# Patient Record
Sex: Female | Born: 1935 | Race: White | Hispanic: No | Smoking: Former smoker
Health system: Southern US, Community
[De-identification: ages and names within clinical notes are randomized; demographics above are authoritative.]

## PROBLEM LIST (undated history)

## (undated) DIAGNOSIS — N200 Calculus of kidney: Secondary | ICD-10-CM

## (undated) DIAGNOSIS — K909 Intestinal malabsorption, unspecified: Secondary | ICD-10-CM

## (undated) DIAGNOSIS — F32A Depression, unspecified: Secondary | ICD-10-CM

## (undated) DIAGNOSIS — K660 Peritoneal adhesions (postprocedural) (postinfection): Secondary | ICD-10-CM

## (undated) DIAGNOSIS — F419 Anxiety disorder, unspecified: Secondary | ICD-10-CM

## (undated) DIAGNOSIS — E039 Hypothyroidism, unspecified: Secondary | ICD-10-CM

## (undated) DIAGNOSIS — K922 Gastrointestinal hemorrhage, unspecified: Secondary | ICD-10-CM

## (undated) DIAGNOSIS — M545 Low back pain, unspecified: Secondary | ICD-10-CM

## (undated) DIAGNOSIS — M1990 Unspecified osteoarthritis, unspecified site: Secondary | ICD-10-CM

## (undated) DIAGNOSIS — Z87442 Personal history of urinary calculi: Secondary | ICD-10-CM

## (undated) DIAGNOSIS — D649 Anemia, unspecified: Secondary | ICD-10-CM

## (undated) DIAGNOSIS — Z9289 Personal history of other medical treatment: Secondary | ICD-10-CM

## (undated) DIAGNOSIS — I4891 Unspecified atrial fibrillation: Secondary | ICD-10-CM

## (undated) DIAGNOSIS — K219 Gastro-esophageal reflux disease without esophagitis: Secondary | ICD-10-CM

## (undated) DIAGNOSIS — J309 Allergic rhinitis, unspecified: Secondary | ICD-10-CM

## (undated) DIAGNOSIS — C50919 Malignant neoplasm of unspecified site of unspecified female breast: Secondary | ICD-10-CM

## (undated) DIAGNOSIS — F329 Major depressive disorder, single episode, unspecified: Secondary | ICD-10-CM

## (undated) DIAGNOSIS — K90829 Short bowel syndrome, unspecified: Secondary | ICD-10-CM

## (undated) DIAGNOSIS — I10 Essential (primary) hypertension: Secondary | ICD-10-CM

## (undated) DIAGNOSIS — K912 Postsurgical malabsorption, not elsewhere classified: Secondary | ICD-10-CM

## (undated) DIAGNOSIS — K8020 Calculus of gallbladder without cholecystitis without obstruction: Secondary | ICD-10-CM

## (undated) DIAGNOSIS — I350 Nonrheumatic aortic (valve) stenosis: Secondary | ICD-10-CM

## (undated) HISTORY — DX: Major depressive disorder, single episode, unspecified: F32.9

## (undated) HISTORY — DX: Nonrheumatic aortic (valve) stenosis: I35.0

## (undated) HISTORY — DX: Essential (primary) hypertension: I10

## (undated) HISTORY — DX: Depression, unspecified: F32.A

## (undated) HISTORY — PX: BOWEL RESECTION: SHX1257

## (undated) HISTORY — DX: Intestinal malabsorption, unspecified: K90.9

## (undated) HISTORY — DX: Gastro-esophageal reflux disease without esophagitis: K21.9

## (undated) HISTORY — DX: Calculus of kidney: N20.0

## (undated) HISTORY — DX: Hypothyroidism, unspecified: E03.9

## (undated) HISTORY — DX: Calculus of gallbladder without cholecystitis without obstruction: K80.20

## (undated) HISTORY — DX: Unspecified atrial fibrillation: I48.91

## (undated) HISTORY — DX: Malignant neoplasm of unspecified site of unspecified female breast: C50.919

## (undated) HISTORY — PX: ABDOMINAL HYSTERECTOMY: SHX81

## (undated) HISTORY — DX: Anxiety disorder, unspecified: F41.9

## (undated) HISTORY — PX: MASTECTOMY: SHX3

## (undated) HISTORY — DX: Gastrointestinal hemorrhage, unspecified: K92.2

## (undated) HISTORY — PX: UPPER GASTROINTESTINAL ENDOSCOPY: SHX188

---

## 1958-12-15 HISTORY — PX: ABDOMINAL HYSTERECTOMY: SHX81

## 1963-12-16 HISTORY — PX: LAPAROSCOPIC LYSIS OF ADHESIONS: SHX5905

## 1992-12-15 DIAGNOSIS — K660 Peritoneal adhesions (postprocedural) (postinfection): Secondary | ICD-10-CM

## 1992-12-15 HISTORY — DX: Peritoneal adhesions (postprocedural) (postinfection): K66.0

## 2000-05-21 ENCOUNTER — Emergency Department (HOSPITAL_COMMUNITY): Admission: EM | Admit: 2000-05-21 | Discharge: 2000-05-21 | Payer: Self-pay | Admitting: Emergency Medicine

## 2001-11-19 ENCOUNTER — Encounter: Payer: Self-pay | Admitting: Urology

## 2001-11-19 ENCOUNTER — Ambulatory Visit (HOSPITAL_COMMUNITY): Admission: RE | Admit: 2001-11-19 | Discharge: 2001-11-19 | Payer: Self-pay | Admitting: Urology

## 2001-11-24 ENCOUNTER — Encounter: Payer: Self-pay | Admitting: Urology

## 2001-11-24 ENCOUNTER — Ambulatory Visit (HOSPITAL_COMMUNITY): Admission: RE | Admit: 2001-11-24 | Discharge: 2001-11-24 | Payer: Self-pay | Admitting: Urology

## 2002-01-05 ENCOUNTER — Ambulatory Visit (HOSPITAL_COMMUNITY): Admission: RE | Admit: 2002-01-05 | Discharge: 2002-01-05 | Payer: Self-pay | Admitting: Urology

## 2002-01-05 ENCOUNTER — Encounter: Payer: Self-pay | Admitting: Urology

## 2002-01-13 ENCOUNTER — Ambulatory Visit (HOSPITAL_COMMUNITY): Admission: RE | Admit: 2002-01-13 | Discharge: 2002-01-13 | Payer: Self-pay | Admitting: Urology

## 2002-01-13 ENCOUNTER — Encounter: Payer: Self-pay | Admitting: Urology

## 2002-12-15 DIAGNOSIS — N200 Calculus of kidney: Secondary | ICD-10-CM

## 2002-12-15 DIAGNOSIS — K922 Gastrointestinal hemorrhage, unspecified: Secondary | ICD-10-CM

## 2002-12-15 HISTORY — DX: Calculus of kidney: N20.0

## 2002-12-15 HISTORY — DX: Gastrointestinal hemorrhage, unspecified: K92.2

## 2002-12-28 ENCOUNTER — Ambulatory Visit (HOSPITAL_COMMUNITY): Admission: RE | Admit: 2002-12-28 | Discharge: 2002-12-28 | Payer: Self-pay | Admitting: Internal Medicine

## 2003-02-28 ENCOUNTER — Encounter: Payer: Self-pay | Admitting: Family Medicine

## 2003-02-28 ENCOUNTER — Ambulatory Visit (HOSPITAL_COMMUNITY): Admission: RE | Admit: 2003-02-28 | Discharge: 2003-02-28 | Payer: Self-pay | Admitting: Family Medicine

## 2003-03-13 ENCOUNTER — Encounter: Payer: Self-pay | Admitting: Family Medicine

## 2003-03-13 ENCOUNTER — Ambulatory Visit (HOSPITAL_COMMUNITY): Admission: RE | Admit: 2003-03-13 | Discharge: 2003-03-13 | Payer: Self-pay | Admitting: Family Medicine

## 2003-06-13 ENCOUNTER — Encounter: Payer: Self-pay | Admitting: *Deleted

## 2003-06-13 ENCOUNTER — Emergency Department (HOSPITAL_COMMUNITY): Admission: EM | Admit: 2003-06-13 | Discharge: 2003-06-13 | Payer: Self-pay | Admitting: *Deleted

## 2003-06-23 ENCOUNTER — Ambulatory Visit (HOSPITAL_COMMUNITY): Admission: RE | Admit: 2003-06-23 | Discharge: 2003-06-23 | Payer: Self-pay | Admitting: Family Medicine

## 2003-06-23 ENCOUNTER — Encounter: Payer: Self-pay | Admitting: Family Medicine

## 2003-06-26 ENCOUNTER — Encounter: Payer: Self-pay | Admitting: Family Medicine

## 2003-06-26 ENCOUNTER — Ambulatory Visit (HOSPITAL_COMMUNITY): Admission: RE | Admit: 2003-06-26 | Discharge: 2003-06-26 | Payer: Self-pay | Admitting: Family Medicine

## 2003-07-13 ENCOUNTER — Encounter: Payer: Self-pay | Admitting: Family Medicine

## 2003-07-13 ENCOUNTER — Ambulatory Visit (HOSPITAL_COMMUNITY): Admission: RE | Admit: 2003-07-13 | Discharge: 2003-07-13 | Payer: Self-pay | Admitting: Family Medicine

## 2003-07-27 ENCOUNTER — Encounter (HOSPITAL_COMMUNITY): Admission: RE | Admit: 2003-07-27 | Discharge: 2003-08-26 | Payer: Self-pay | Admitting: *Deleted

## 2003-08-28 ENCOUNTER — Encounter (HOSPITAL_COMMUNITY): Admission: RE | Admit: 2003-08-28 | Discharge: 2003-09-27 | Payer: Self-pay | Admitting: Orthopedic Surgery

## 2003-10-02 ENCOUNTER — Encounter (HOSPITAL_COMMUNITY): Admission: RE | Admit: 2003-10-02 | Discharge: 2003-11-01 | Payer: Self-pay | Admitting: Orthopedic Surgery

## 2003-12-16 HISTORY — PX: COLONOSCOPY W/ POLYPECTOMY: SHX1380

## 2004-04-11 ENCOUNTER — Ambulatory Visit (HOSPITAL_COMMUNITY): Admission: RE | Admit: 2004-04-11 | Discharge: 2004-04-11 | Payer: Self-pay | Admitting: Internal Medicine

## 2004-08-21 ENCOUNTER — Ambulatory Visit (HOSPITAL_COMMUNITY): Admission: RE | Admit: 2004-08-21 | Discharge: 2004-08-21 | Payer: Self-pay | Admitting: Family Medicine

## 2004-09-13 ENCOUNTER — Ambulatory Visit (HOSPITAL_COMMUNITY): Admission: RE | Admit: 2004-09-13 | Discharge: 2004-09-13 | Payer: Self-pay | Admitting: Urology

## 2004-10-04 ENCOUNTER — Ambulatory Visit (HOSPITAL_COMMUNITY): Admission: RE | Admit: 2004-10-04 | Discharge: 2004-10-04 | Payer: Self-pay | Admitting: Urology

## 2004-10-09 ENCOUNTER — Ambulatory Visit (HOSPITAL_COMMUNITY): Admission: RE | Admit: 2004-10-09 | Discharge: 2004-10-09 | Payer: Self-pay | Admitting: Urology

## 2004-10-16 ENCOUNTER — Ambulatory Visit (HOSPITAL_COMMUNITY): Admission: RE | Admit: 2004-10-16 | Discharge: 2004-10-16 | Payer: Self-pay | Admitting: Urology

## 2004-12-24 ENCOUNTER — Ambulatory Visit (HOSPITAL_COMMUNITY): Admission: RE | Admit: 2004-12-24 | Discharge: 2004-12-24 | Payer: Self-pay | Admitting: Family Medicine

## 2005-01-08 ENCOUNTER — Ambulatory Visit (HOSPITAL_COMMUNITY): Admission: RE | Admit: 2005-01-08 | Discharge: 2005-01-08 | Payer: Self-pay | Admitting: Urology

## 2005-02-03 ENCOUNTER — Ambulatory Visit: Payer: Self-pay | Admitting: Internal Medicine

## 2005-06-02 ENCOUNTER — Ambulatory Visit: Payer: Self-pay | Admitting: Internal Medicine

## 2005-06-02 ENCOUNTER — Ambulatory Visit (HOSPITAL_COMMUNITY): Admission: RE | Admit: 2005-06-02 | Discharge: 2005-06-02 | Payer: Self-pay | Admitting: Internal Medicine

## 2005-08-12 ENCOUNTER — Ambulatory Visit: Payer: Self-pay | Admitting: Internal Medicine

## 2005-08-26 ENCOUNTER — Ambulatory Visit (HOSPITAL_COMMUNITY): Admission: RE | Admit: 2005-08-26 | Discharge: 2005-08-26 | Payer: Self-pay | Admitting: Family Medicine

## 2005-09-05 ENCOUNTER — Ambulatory Visit: Payer: Self-pay | Admitting: Internal Medicine

## 2005-09-11 ENCOUNTER — Ambulatory Visit: Payer: Self-pay | Admitting: Internal Medicine

## 2005-09-16 ENCOUNTER — Ambulatory Visit: Payer: Self-pay | Admitting: Internal Medicine

## 2006-02-04 ENCOUNTER — Ambulatory Visit: Payer: Self-pay | Admitting: Internal Medicine

## 2006-03-23 ENCOUNTER — Emergency Department (HOSPITAL_COMMUNITY): Admission: EM | Admit: 2006-03-23 | Discharge: 2006-03-23 | Payer: Self-pay | Admitting: Emergency Medicine

## 2006-04-14 ENCOUNTER — Ambulatory Visit: Payer: Self-pay | Admitting: Gastroenterology

## 2006-09-10 ENCOUNTER — Ambulatory Visit: Payer: Self-pay | Admitting: Internal Medicine

## 2007-04-30 ENCOUNTER — Ambulatory Visit: Payer: Self-pay | Admitting: Internal Medicine

## 2007-05-25 ENCOUNTER — Ambulatory Visit (HOSPITAL_COMMUNITY): Admission: RE | Admit: 2007-05-25 | Discharge: 2007-05-25 | Payer: Self-pay | Admitting: Family Medicine

## 2007-05-31 ENCOUNTER — Ambulatory Visit: Payer: Self-pay | Admitting: Orthopedic Surgery

## 2007-06-03 ENCOUNTER — Ambulatory Visit (HOSPITAL_COMMUNITY): Admission: RE | Admit: 2007-06-03 | Discharge: 2007-06-03 | Payer: Self-pay | Admitting: Orthopedic Surgery

## 2007-06-07 ENCOUNTER — Ambulatory Visit: Payer: Self-pay | Admitting: Orthopedic Surgery

## 2007-08-06 ENCOUNTER — Emergency Department (HOSPITAL_COMMUNITY): Admission: EM | Admit: 2007-08-06 | Discharge: 2007-08-06 | Payer: Self-pay | Admitting: Emergency Medicine

## 2007-08-11 ENCOUNTER — Ambulatory Visit (HOSPITAL_COMMUNITY): Admission: RE | Admit: 2007-08-11 | Discharge: 2007-08-11 | Payer: Self-pay | Admitting: Family Medicine

## 2007-08-13 ENCOUNTER — Emergency Department (HOSPITAL_COMMUNITY): Admission: EM | Admit: 2007-08-13 | Discharge: 2007-08-13 | Payer: Self-pay | Admitting: Emergency Medicine

## 2007-09-09 ENCOUNTER — Ambulatory Visit (HOSPITAL_COMMUNITY): Admission: RE | Admit: 2007-09-09 | Discharge: 2007-09-09 | Payer: Self-pay | Admitting: Family Medicine

## 2008-06-24 ENCOUNTER — Ambulatory Visit (HOSPITAL_COMMUNITY): Admission: RE | Admit: 2008-06-24 | Discharge: 2008-06-24 | Payer: Self-pay | Admitting: Family Medicine

## 2008-07-21 ENCOUNTER — Ambulatory Visit (HOSPITAL_COMMUNITY): Admission: RE | Admit: 2008-07-21 | Discharge: 2008-07-21 | Payer: Self-pay | Admitting: Family Medicine

## 2009-02-19 ENCOUNTER — Ambulatory Visit (HOSPITAL_COMMUNITY): Admission: RE | Admit: 2009-02-19 | Discharge: 2009-02-19 | Payer: Self-pay | Admitting: Family Medicine

## 2009-04-20 ENCOUNTER — Ambulatory Visit (HOSPITAL_COMMUNITY): Admission: RE | Admit: 2009-04-20 | Discharge: 2009-04-20 | Payer: Self-pay | Admitting: Family Medicine

## 2009-08-24 ENCOUNTER — Ambulatory Visit (HOSPITAL_COMMUNITY): Admission: RE | Admit: 2009-08-24 | Discharge: 2009-08-24 | Payer: Self-pay | Admitting: Family Medicine

## 2010-02-07 ENCOUNTER — Ambulatory Visit (HOSPITAL_COMMUNITY): Admission: RE | Admit: 2010-02-07 | Discharge: 2010-02-07 | Payer: Self-pay | Admitting: Ophthalmology

## 2010-02-21 ENCOUNTER — Ambulatory Visit (HOSPITAL_COMMUNITY): Admission: RE | Admit: 2010-02-21 | Discharge: 2010-02-21 | Payer: Self-pay | Admitting: Ophthalmology

## 2011-01-05 ENCOUNTER — Encounter: Payer: Self-pay | Admitting: Family Medicine

## 2011-01-20 ENCOUNTER — Institutional Professional Consult (permissible substitution) (INDEPENDENT_AMBULATORY_CARE_PROVIDER_SITE_OTHER): Payer: Medicare Other | Admitting: Internal Medicine

## 2011-01-20 DIAGNOSIS — K9089 Other intestinal malabsorption: Secondary | ICD-10-CM

## 2011-03-05 LAB — BASIC METABOLIC PANEL
BUN: 20 mg/dL (ref 6–23)
Chloride: 114 mEq/L — ABNORMAL HIGH (ref 96–112)
GFR calc non Af Amer: 43 mL/min — ABNORMAL LOW (ref >60)
Potassium: 3.9 mEq/L (ref 3.5–5.1)
Sodium: 142 mEq/L (ref 135–145)

## 2011-03-05 LAB — HEMOGLOBIN AND HEMATOCRIT, BLOOD: Hemoglobin: 10.9 g/dL — ABNORMAL LOW (ref 12.0–15.0)

## 2011-04-29 NOTE — Assessment & Plan Note (Signed)
NAMEMarland Kitchen  Jane Andrews, Jane Andrews            CHART#:  [id number]   DATE:  04/30/2007                       DOB:  02-04-1925   PRESENTING COMPLAINT:  Follow of her diarrhea and GI bleed.   SUBJECTIVE:  Jane Andrews is a 77 year old Caucasian female who is here for a  scheduled visit.  She was last seen in September 2007.  She is getting  her periodic H&H tests.  She has had multiple episodes of melena felt to  be either from gastritis or small bowel injury related to Fosamax.  Since she has been on a PPI, she has done well.  She has not had any  melena in the last 3 months.  She denies abdominal pain or heartburn.  She remains with diarrhea which is frequent but not every day.  She has  4-5 bowel movements and most of her stools are post prandial.  She  denies rectal bleeding.  She remains with a good appetite.  She is  requesting a note so that she will not have to go to jury duty because  of her diarrhea problem which is very reasonable.  She is also having  problems with fungal infection to one of her fingernails and is under  the care of Dr. [place] Crane.  She wonders if I could write a prescription for  Lamisil but I suggested that this be done by Dr. Nevada Crane or Dr. Caron Presume.   She is on:  1. Acidophilus one capsule daily.  2. Multivitamin.  3. Ginkgo biloba.  4. Fish oil daily.  5. Calcium with vitamin D two a day.  6. Cymbalta 60 mg daily.  7. Glucosamine 1 capsule b.i.d.  8. Folic acid 1 daily.  9. Benicar 20 mg daily.  10.Prilosec OTC one q.a.m.  11.B12 injection once a month.  12.Xanax 1 mg q.h.s.  13.Fosamax 70 mg every week.  14.Biotin daily.   OBJECTIVE:  VITAL SIGNS:  Weight 131 pounds which is close to her  baseline.  She is 64 inches tall.  Pulse 76 per minute, blood pressure  120/80, temp is 98.2.  HEENT:  Conjunctivae and nail beds are pink.  Sclerae are nonicteric.  Oropharyngeal mucosa is normal.  NECK:  No neck masses are noted.  ABDOMEN:  Flat.  Bowel sounds are hyperactive.   But, it is soft and  nontender.  No peripheral edema or clubbing noted.   H&H from March 16, 2007, was 12 and 37.2.   ASSESSMENT:  1. 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  She has      gradually adapted to this situation and having symptoms      intermittently but frequently enough that therapy may be helpful.      She is on chronic B12 therapy which she needs to be because she has      had most of her ileum removed.  2. Chronic/recurrent gastrointestinal bleed with drop in her H&H.      This year she has done well maintaining her H&H in normal range in      January it was 13.1 and 39.9 and more recently 12 and 37.2.   PLAN:  1. The patient was given a note so that she can be excused from jury      duty.  2. Lomotil one b.i.d. p.r.n., prescription given for #60,  with two      refills.  3. The patient will have a CBC in August 2008, and go from there.       Hildred Laser, M.D.  Electronically Signed     NR/MEDQ  D:  04/30/2007  T:  04/30/2007  Job:  [id number]   cc:   Bonne Dolores, M.D.

## 2011-05-02 NOTE — Op Note (Signed)
NAMETAMYLA, AMENDT NO.:  [id number]   MEDICAL RECORD NO.:  [id number]          PATIENT TYPE:  AMB   LOCATION:  DAY                           FACILITY:  [facility]   PHYSICIAN:  Hildred Laser, M.D.    DATE OF BIRTH:  June 12, 1925   DATE OF PROCEDURE:  06/02/2005  DATE OF DISCHARGE:  06/02/2005                                 OPERATIVE REPORT   PROCEDURE:  Small bowel Given capsule study.   INDICATIONS:  Dot is a 77 year old Caucasian female with recurrent GI bleed.  She had EGD and colonoscopy in January2004. She had EGD in April last year.  Now, she presented with melena. Previously, she had been on NSAID therapy  but not this time. Since no significant abnormality was found on prior EGDs  and a colonoscopy, capsule study was recommended. The patient was agreeable.  She is status post a small-bowel resection on two different occasions. She  has had chronic diarrhea responding to intermittent use of antibiotics.  Therefore, she was felt to be secondary to stasis and bacterial overgrowth.   FINDINGS:  The patient was able to swallow Given capsule without any  difficulty.   It made it to the stomach in less than a minute. There were a few antral  erosions without stigmata of bleeding.   Capsule reached the bulb in 36 minutes.   It reached the ileocolic anastomosis at 1 hour and 49 minutes.   There were two small jejunal ulcers with clean base. Examination of the  colon was impossible because of stool.   IMPRESSION:  1.  Gastric erosions without stigmata of bleeding.  2.  Two jejunal ulcers possibly related to therapy with Fosamax.   RECOMMENDATIONS:  The patient will resume her usual medications including  ASA 81 mg and Fosamax as before. Should she experience another episode of  melena, she can stop her ASA and Fosamax for at least a week or two. We will  continue to periodically monitor H&H to make sure that she does not become  anemic.       NR/MEDQ  D:   06/10/2005  T:  06/11/2005  Job:  [id number]   cc:   Bonne Dolores, M.D.  [address], [place] [address]  Fax: [phone]

## 2011-05-02 NOTE — H&P (Signed)
Jane Andrews, GUNNIN NO.:  [id number]   MEDICAL RECORD NO.:  [id number]         PATIENT TYPE:  AMB   LOCATION:                                FACILITY:  [facility]   PHYSICIAN:  Marissa Nestle, M.D.DATE OF BIRTH:  Mar 12, 1925   DATE OF ADMISSION:  DATE OF DISCHARGE:  LH                                HISTORY & PHYSICAL   CHIEF COMPLAINT:  Gross total painless hematuria.   HISTORY:  A 77 year old female who is well known to me, who presented with  gross total painless hematuria and said she could not be sure that she is  bleeding from the left side.  CT scan showed there is a 5-mm calculus in the  left renal pelvis causing no obstruction and no pain.  She had the same  problem last year on the right side.  She was having gross hematuria with a  right renal pelvic stone and underwent ESL and the hematuria disappeared.  I  told her the same thing, that we can treat the stone, most likely that is  the cause of this bleeding, and if the bleeding does not stop, then we will  do further workup.  She is coming as an outpatient and undergoing ESL for  left renal calculus.   PAST MEDICAL HISTORY:  She had a mastectomy on the right side in 1988 for  cancer.  There is no recurrence.  Abdominal hysterectomy for bleeding after  childbirth.  She had three surgeries for intestinal obstruction for  adhesions.   MEDICATIONS:  1.  Celebrex.  2.  Prozac.  3.  Nasonex.  4.  Clarinex.   SOCIAL HISTORY:  Personally, she does not smoke or drink.   REVIEW OF SYSTEMS:  Unremarkable.   PHYSICAL EXAMINATION:  VITAL SIGNS:  Blood pressure 180/80, temperature  normal.  CENTRAL NERVOUS SYSTEM: Negative.  HEAD/NECK/EYE/ENT:  Negative.  HEART:  Regular sinus rhythm.  ABDOMEN:  Flat.  Spleen and kidneys are not palpable.  No CVA tenderness.  PELVIC:  Exam is unremarkable.   IMPRESSION:  Gross hematuria, left renal calculus.   PLAN:  Extracorporeal shockwave lithotripsy left renal  calculus.     Moha   MIJ/MEDQ  D:  10/08/2004  T:  10/08/2004  Job:  [id number]

## 2011-05-02 NOTE — Op Note (Signed)
NAME:  Jane Andrews, Jane Andrews                     ACCOUNT NO.:  [id number]   MEDICAL RECORD NO.:  [id number]                   PATIENT TYPE:  AMB   LOCATION:  DAY                                  FACILITY:  [facility]   PHYSICIAN:  Hildred Laser, M.D.                 DATE OF BIRTH:  05/23/1925   DATE OF PROCEDURE:  DATE OF DISCHARGE:                                 OPERATIVE REPORT   PROCEDURE:  Esophagogastroduodenoscopy with duodenal biopsy followed by  total colonoscopy.   ENDOSCOPIST:  Hildred Laser, M.D.   INDICATIONS:  This patient is a 77 year old Caucasian female with  malabsorption.  Recent quantitative fecal fat analysis revealed 45.2 gm of  fat in a 24-hour period.  She has had significant segment of her small-bowel  resected and has been maintained on B12.  Her B12 level was normal.  She, in  fact, had developed intestinal adaptation with resolution of diarrhea, but  now it has been back. She also has elevated CEA of 6.5.  Her last  colonoscopy was about 9 years ago.  At that time her CEA was also mildly  elevated.  She is undergoing diagnostic studies.  The procedure and risks  were reviewed with the patient and informed consent was obtained.   PREOPERATIVE MEDICATIONS:  Cetacaine spray for oropharyngeal topical  anesthesia, Demerol 50 mg IV and Versed 7 mg IV in divided dose.   INSTRUMENT:  Olympus video system.   FINDINGS:  Procedure performed in endoscopy suite.  The patient's vital  signs and O2 saturation were monitored during the procedure and remained  stable.   PROCEDURE #1: ESOPHAGOGASTRODUODENOSCOPY:  The patient was placed in the  left lateral recumbent position and endoscope was passed via the oropharynx  without any difficulty into the esophagus.   ESOPHAGUS:  Mucosa of the esophagus was normal throughout.  Squamocolumnar  junction was wavy, but there was no hernia noted.   STOMACH:  It was empty and distended very well with insufflation.  The folds  of  the proximal stomach were normal.  Examination of the mucosa revealed  patchy erythema; and the mucosa of the pyloric channel was somewhat friable,  but there were no erosions or ulcers noted.  The pyloric channel was patent.  The angularis and fundus were examined by retroflexing the scope and were  normal.   DUODENUM:  Examination of the bulb reveled normal mucosa.  The scope was  passed into the second and third part of the duodenum.  Mucosal folds were  somewhat blunted, but mucosa was normal.  Three biopsies were taken from the  third part of the duodenum for routine histology.   Endoscope was withdrawn and the patient was prepared for procedure #2.   COLONOSCOPY:  The rectal examination performed.  No abnormality noted on  external or digital exam.   The scope was placed in the rectum and advanced under vision into the  sigmoid  colon which was relatively fixed and tortuous.  Slowly and carefully  the scope was passed into the descending colon and further intubation into  the right hepatic flexure are was easy.  There was a wide open ileocolic  anastomosis.  This appeared to be end-to-side anastomosis. There was a focal  mucosal swelling involving the colonic mucosa.  It did not appear to be an  adenoma.  Biopsy was taken for routine histology.  As the scope was  withdrawn colonic mucosa was, once again, carefully examined and there were  few tiny diverticula at the descending sigmoid colon.  Rectal mucosa was  normal.   The scope was retroflexed to examine the anorectal junction and hemorrhoids  were noted above and below the dentate line. The endoscope was straightened  and withdrawn.  The patient tolerated the procedures well.   FINAL DIAGNOSES:  1. Nonerosive gastritis.  Biopsy taken from the third part of the duodenum     looking for mucosal disease.  2. Colonoscopy performed to rule out colonic anastomosis, felt to be     negative hepatic flexure.  A focal area of  mucosal swelling just beyond     the anastomosis.  Biopsy was taken to make sure that this was not a small     adenoma.  3. A few small diverticula noted at the sigmoid colon.  4. Internal/external hemorrhoids   RECOMMENDATIONS:  1. H. pylori serology will be checked today.  2. I will contact the patient with biopsy results and further     recommendations.                                               Hildred Laser, M.D.    NR/MEDQ  D:  12/28/2002  T:  12/28/2002  Job:  [id number]   cc:   Bonne Dolores, M.D.  [address], [place] [address]  Fax: [phone]

## 2011-05-02 NOTE — H&P (Signed)
Jane Andrews, Jane Andrews NO.:  [id number]   MEDICAL RECORD NO.:  [id number]                  PATIENT TYPE:   LOCATION:                                       FACILITY:  [facility]   PHYSICIAN:  Hildred Laser, M.D.                 DATE OF BIRTH:  Jan 03, 1925   DATE OF ADMISSION:  DATE OF DISCHARGE:                                HISTORY & PHYSICAL   PRESENTING COMPLAINT:  Epigastric pain and melena.   HISTORY OF PRESENT ILLNESS:  Jane Andrews is 77 year old Caucasian female who  presents with 5-day history of melena.  This [place].  For the last few days she has noted sharp epigastric pain.  When she  noted melena, she discontinued her Celebrex.  Her stool is still black.  She  has not experienced nausea, vomiting, or heartburn.  She also denies  dysphagia.  Her stools are black but today they are somewhat lighter in  color.  She has been experiencing a lot of burping but denies fever or  chills.  She took antibiotics last month.  She is presently not having any  diarrhea.  She denies __________ symptoms.  She did bring [name] a stool sample  on April 05, 2004 which was Hemoccult positive.   She is on MVI once daily, calcium with vitamin D 600 mg once daily, Prozac  20 mg once daily, Xanax 0.5 mg at bedtime, B12 injection 1 mg IM every  month, Fosamax 70 mg every week, fish oil once daily, glucosamine once  daily, ginkgo once daily.  She took Flagyl last month.   PAST MEDICAL HISTORY:  She has malabsorptive syndrome felt to be due to  stasis with bacterial overgrowth.  She is using cyclic antibiotics with good  results.  At one point she was down to about 120 pounds.  Her weight on her  last visit was 132 which means she has lost 6 pounds.  She has stool studies  and she was spilling over 40 g of fat per day.  Her duodenal biopsy and  serial markers were negative for celiac disease.  Her last EGD was in  January 2004.  She had Helicobacter  pylori gastritis and she has been  treated with Prevpac last year.   Her colonoscopy was also in January up to colonic anastamosis which was  negative except for a single small polyp which turned out to be a lipoma.  She had few diverticula in the sigmoid colon along with internal and  external hemorrhoids.  History of breast carcinoma.  She is status post  right mastectomy and remains in remission.  She has history of depression  and anxiety, she has cholelithiasis felt to be asymptomatic, and she also  has history of nephrolithiasis.  She had hysterectomy in 1960 for massive  hemorrhage requiring 20 units of PRBC.  She had laparoscopy with lysis of  adhesions in 1965.  In November 1994 she presented with small-bowel  obstruction and had surgery with resection of a segment of her small bowel  and 5 weeks later she required more surgery and had another 16 cm of small  bowel resected.  Since then she has had malabsorptive syndrome.  She also  has history of hiatal hernia.   ALLERGIES:  CODEINE which causes nausea/vomiting.   FAMILY HISTORY:  Mother died last year at age 37.  Father died of brain  aneurysm at age 74.  She has four sisters and two brothers and one brother  is not doing well.   SOCIAL HISTORY:  She is married.  She has one son who is now unemployed and  has problems with drinking.  She is very concerned about his situation.  Jane Andrews  is retired.  She does not smoke cigarettes and drinks alcohol very  occasionally.   PHYSICAL EXAMINATION:  GENERAL:  Pleasant well-developed thin Caucasian  female who is in no acute distress.  VITAL SIGNS:  She weighs 126-1/2 pounds; pulse 73 per minute, blood pressure  118/78, temperature is 97.7.  HEENT:  Conjunctivae are pink.  Sclerae are nonicteric.  Oropharyngeal  mucosa is normal.  NECK:  Without masses or thyromegaly.  CARDIAC:  Regular rhythm, normal S1 and S2.  She has faint systolic ejection  murmur at the LLSB.  LUNGS:  Clear to  auscultation.  ABDOMEN:  Symmetrical, bowel sounds are normal.  Palpation reveals soft  abdomen with mild midepigastric tenderness.  No organomegaly or masses.  RECTAL:  Examination deferred.  Her stool 3 days ago was Hemoccult positive.  EXTREMITIES:  She does not have peripheral edema.   LABORATORIES:  CBC from April 05, 2004 - WBC is 5.9, H&H are 12.1 and 35.7,  platelet count is 215.   ASSESSMENT:  Jane Andrews is a 77 year old Caucasian female who presents with 5-day  history of melena whose H&H are normal.  She also is experiencing epigastric  pain.  She does not have any postural symptoms.  Suspect peptic ulcer  disease secondary to nonsteroidal anti-inflammatory drug therapy.  She was  on Celebrex which was discontinued 5 days ago.  Other risk factor would be  esophagitis secondary to Fosamax.  If indeed she has peptic ulcer disease  she will have to refrain from using nonsteroidal anti-inflammatory drugs at  least on regular basis.   RECOMMENDATIONS:  Aciphex 20 mg p.o. every morning, first dose today,  samples given.  Diagnostic esophagogastroduodenoscopy to be performed at [facility]  in near future.   Patient was also given a prescription for Prozac 20 mg (#90) with three  refills.     ___________________________________________                                         Hildred Laser, M.D.   NR/MEDQ  D:  04/08/2004  T:  04/09/2004  Job:  [id number]   cc:   Bonne Dolores, M.D.  [address] [place] [address]  Fax: [place]

## 2011-05-02 NOTE — Op Note (Signed)
NAME:  Jane Andrews, Jane Andrews                     ACCOUNT NO.:  [id number]   MEDICAL RECORD NO.:  [id number]                   PATIENT TYPE:  AMB   LOCATION:  DAY                                  FACILITY:  [facility]   PHYSICIAN:  Hildred Laser, M.D.                 DATE OF BIRTH:  1925/01/06   DATE OF PROCEDURE:  04/11/2004  DATE OF DISCHARGE:                                 OPERATIVE REPORT   PROCEDURE:  Esophagogastroduodenoscopy.   INDICATIONS FOR PROCEDURE:  Dot is a 77 year old Caucasian female who  presents with epigastric pain and melena.  Her H&H is normal at 12.1 and  35.7.  She has been on Celebrex until eight days ago when she stopped it.  She is also on Fosamax but has not experienced any dysphagia.  She is  undergoing diagnostic EGD.  The procedure risks were reviewed with the  patient, and informed consent for the procedure was obtained.   PREOPERATIVE MEDICATIONS:  Cetacaine spray for oropharyngeal topical  anesthesia, Demerol 25 mg IV, Versed 5 mg IV in divided dose.   FINDINGS:  The procedure was performed in the endoscopy suite.  The  patient's vital signs and O2 saturations were monitored during the procedure  and remained stable.  The patient was placed in the left lateral recumbent  position, and the Olympus videoscope was passed via the oropharynx without  any difficulty into the esophagus.   Esophagus:  In the proximal segment, there were two slightly raised areas of  mucosa with a granular surface.  A biopsy was taken from these for routine  histology on the way out.  There were two tiny erosions at the distal  esophagus.  There was erythema at the GE junction which was wavy.   Stomach:  It was empty and distended very well with insufflation.  The folds  of the proximal stomach were normal.  Examination of  the mucosa revealed  antral erythema and granularity, but no erosions or ulcers were noted.  The  pyloric channel was patent.  The angularis, fundus, and  cardia were examined  by retroflexing the scope and were normal.   Duodenum:  Examination of the bulb and postbulbar duodenum was normal.  The  endoscope was withdrawn.  The patient tolerated the procedure well.   FINAL DIAGNOSES:  1. No evidence of peptic ulcer disease.  2. Nonerosive gastritis which may be due to recent nonsteroidal anti-     inflammatory drug use.  She has been treated for Helicobacter pylori     infection in the past.  3. Erosive reflux esophagitis and two small areas of raised mucosa at the     proximal esophagus that were biopsied.  4. Suspect gastrointestinal blood loss from small bowel.   RECOMMENDATIONS:  1. She will continue to hold her Celebrex and stay on Aciphex 20 mg p.o.     q.a.m.  2. I will be contacting the patient  with the biopsy results.  3. If she remains with epigastric pain, will consider abdominal CT.      ___________________________________________                                            Hildred Laser, M.D.   NR/MEDQ  D:  04/11/2004  T:  04/11/2004  Job:  [id number]   cc:   Bonne Dolores, M.D.  [address], [place] [address]  Fax: [phone]

## 2011-08-26 ENCOUNTER — Encounter: Payer: Self-pay | Admitting: *Deleted

## 2011-08-26 ENCOUNTER — Emergency Department (HOSPITAL_COMMUNITY)
Admission: EM | Admit: 2011-08-26 | Discharge: 2011-08-26 | Disposition: A | Discharge status: Home / Self Care | Payer: Medicare Other | Attending: Emergency Medicine | Admitting: Emergency Medicine

## 2011-08-26 ENCOUNTER — Emergency Department (HOSPITAL_COMMUNITY): Payer: Medicare Other

## 2011-08-26 DIAGNOSIS — I10 Essential (primary) hypertension: Secondary | ICD-10-CM | POA: Insufficient documentation

## 2011-08-26 DIAGNOSIS — R0789 Other chest pain: Secondary | ICD-10-CM | POA: Insufficient documentation

## 2011-08-26 DIAGNOSIS — W010XXA Fall on same level from slipping, tripping and stumbling without subsequent striking against object, initial encounter: Secondary | ICD-10-CM | POA: Insufficient documentation

## 2011-08-26 DIAGNOSIS — S20219A Contusion of unspecified front wall of thorax, initial encounter: Secondary | ICD-10-CM | POA: Insufficient documentation

## 2011-08-26 DIAGNOSIS — Z79899 Other long term (current) drug therapy: Secondary | ICD-10-CM | POA: Insufficient documentation

## 2011-08-26 NOTE — ED Provider Notes (Signed)
History     CSN: [id number] Arrival date & time: 08/26/2011  6:19 AM  Chief Complaint  Patient presents with  . Fall  . Chest Pain   HPI Comments: Seen 0713.  Patient is a 77 y.o. female presenting with fall and chest pain. The history is provided by the patient.  Fall The accident occurred yesterday. The fall occurred while walking (lost footing and fell onto left side on pavement.). She fell from a height of 1 to 2 ft. She landed on concrete. There was no blood loss. Point of impact: left chest and breast. The pain is at a severity of 5/10. The pain is moderate. She was ambulatory at the scene. Pertinent negatives include no numbness and no vomiting. The symptoms are aggravated by pressure on the injury. Treatments tried: narcotic. The treatment provided no relief.  Chest Pain Pertinent negatives for primary symptoms include no vomiting.  Pertinent negatives for associated symptoms include no numbness.     Past Medical History  Diagnosis Date  . Hypertension     Past Surgical History  Procedure Date  . Abdominal hysterectomy   . Abdominal surgery     History reviewed. No pertinent family history.  History  Substance Use Topics  . Smoking status: Never Smoker   . Smokeless tobacco: Not on file  . Alcohol Use: No    OB History    Grav Para Term Preterm Abortions TAB SAB Ect Mult Living                  Review of Systems  Cardiovascular: Positive for chest pain.  Gastrointestinal: Negative for vomiting.  Neurological: Negative for numbness.  All other systems reviewed and are negative.    Physical Exam  BP 119/66  Pulse 62  Temp 97.7 F (36.5 C)  Resp 16  Ht 5\' 4"  (1.626 m)  Wt 120 lb (54.432 kg)  BMI 20.60 kg/m2  SpO2 99%  Physical Exam  Nursing note and vitals reviewed. Constitutional: She is oriented to person, place, and time. She appears well-developed and well-nourished. No distress.  HENT:  Head: Normocephalic and atraumatic.  Eyes: EOM are  normal.  Neck: Neck supple. No JVD present. No thyromegaly present.  Cardiovascular: Normal rate, normal heart sounds and intact distal pulses.   Pulmonary/Chest: Effort normal and breath sounds normal. No respiratory distress. She has no wheezes. She has no rales. She exhibits tenderness.       Chest wall tenderness to left side. No crepitus, no step off, no crepitus, no bruising, no abrasions.  Abdominal: Soft.  Musculoskeletal: Normal range of motion.  Neurological: She is alert and oriented to person, place, and time.  Skin: Skin is warm and dry.    ED Course  Procedures Dg Chest 2 View  08/26/2011  *RADIOLOGY REPORT*  Clinical Data: Chest trauma.  Ground-level fall.  Anterior chest pain and soreness.  CHEST - 2 VIEW  Comparison: 02/19/2009.  Findings: Right axillary dissection clips are present. Cardiopericardial silhouette appears within normal limits.  Linear scarring and / or atelectasis is present at the left lung base extending to the left costophrenic angle.  Mediastinal contours are within normal limits.  There is no pneumothorax.  No airspace disease.  Left basilar atelectasis is noted on the lateral view. No displaced sternal fracture.  Mid thoracic spondylosis.  No displaced rib fractures are present. Eventration of the right hemidiaphragm.  IMPRESSION: No acute abnormality.  Postoperative changes of right axillary dissection.  Left lower lobe linear  atelectasis extending to the left costophrenic angle.  Original Report Authenticated By: Dereck Ligas, M.D.    Patient with a fall that occured yesterday. Now with left chest wall tenderness. Xray with no evidence of fractures. Patient is using hydrocodone at home. Education for treatment of chest wall bruising, heat,ice, tylenol. Narcotic can be continued if needed.Follow up with Todd: nursing note and vitals Interpretation: x-ray        Gypsy Balsam. Olin Hauser, MD 08/26/11 [phone]

## 2011-08-26 NOTE — ED Notes (Addendum)
Pt reports that she fell yesterday, and struck chest on the curb.  Pt states that she took some medication last night to help with pain but has not experienced any relief.  Pt reports pain in left portion of chest. Denies any SOB, or increased pain with deep breathing.  VS stable.  Pt ambulates with no difficulty.

## 2011-12-18 DIAGNOSIS — I10 Essential (primary) hypertension: Secondary | ICD-10-CM | POA: Diagnosis not present

## 2011-12-18 DIAGNOSIS — IMO0002 Reserved for concepts with insufficient information to code with codable children: Secondary | ICD-10-CM | POA: Diagnosis not present

## 2011-12-18 DIAGNOSIS — F411 Generalized anxiety disorder: Secondary | ICD-10-CM | POA: Diagnosis not present

## 2011-12-18 DIAGNOSIS — F329 Major depressive disorder, single episode, unspecified: Secondary | ICD-10-CM | POA: Diagnosis not present

## 2011-12-18 DIAGNOSIS — E039 Hypothyroidism, unspecified: Secondary | ICD-10-CM | POA: Diagnosis not present

## 2011-12-18 DIAGNOSIS — M810 Age-related osteoporosis without current pathological fracture: Secondary | ICD-10-CM | POA: Diagnosis not present

## 2012-01-09 ENCOUNTER — Encounter (INDEPENDENT_AMBULATORY_CARE_PROVIDER_SITE_OTHER): Payer: Self-pay | Admitting: *Deleted

## 2012-01-20 DIAGNOSIS — H353190 Nonexudative age-related macular degeneration, unspecified eye, stage unspecified: Secondary | ICD-10-CM | POA: Diagnosis not present

## 2012-01-20 DIAGNOSIS — H5200 Hypermetropia, unspecified eye: Secondary | ICD-10-CM | POA: Diagnosis not present

## 2012-01-20 DIAGNOSIS — H52229 Regular astigmatism, unspecified eye: Secondary | ICD-10-CM | POA: Diagnosis not present

## 2012-01-20 DIAGNOSIS — Z961 Presence of intraocular lens: Secondary | ICD-10-CM | POA: Diagnosis not present

## 2012-02-02 DIAGNOSIS — D518 Other vitamin B12 deficiency anemias: Secondary | ICD-10-CM | POA: Diagnosis not present

## 2012-02-10 ENCOUNTER — Ambulatory Visit (INDEPENDENT_AMBULATORY_CARE_PROVIDER_SITE_OTHER): Payer: Medicare Other | Admitting: Internal Medicine

## 2012-02-10 ENCOUNTER — Encounter (INDEPENDENT_AMBULATORY_CARE_PROVIDER_SITE_OTHER): Payer: Self-pay | Admitting: Internal Medicine

## 2012-02-10 DIAGNOSIS — I10 Essential (primary) hypertension: Secondary | ICD-10-CM | POA: Insufficient documentation

## 2012-02-10 DIAGNOSIS — K6389 Other specified diseases of intestine: Secondary | ICD-10-CM

## 2012-02-10 DIAGNOSIS — M810 Age-related osteoporosis without current pathological fracture: Secondary | ICD-10-CM | POA: Insufficient documentation

## 2012-02-10 DIAGNOSIS — K529 Noninfective gastroenteritis and colitis, unspecified: Secondary | ICD-10-CM

## 2012-02-10 DIAGNOSIS — R197 Diarrhea, unspecified: Secondary | ICD-10-CM

## 2012-02-10 MED ORDER — METRONIDAZOLE 250 MG PO TABS: 250.0000 mg | ORAL_TABLET | Freq: Three times a day (TID) | ORAL | Status: DC

## 2012-02-10 NOTE — Progress Notes (Signed)
Presenting complaint; Followup for diarrhea and flatulence. Patient with known short bowel syndrome. Subjective: Patient is 78 year old Caucasian female who is here for scheduled visit. She was last seen one year ago. She has diarrhea secondary to extensive small bowel resection and bacterial overgrowth. She states her symptoms are back but not as bad as before. She needs new prescription for antibiotic. She states she takes antibiotic for 3-4 days and she generally does well for 30 days. Her appetite is very good and she has not lost any weight since her last visit. She is having joint pain but staying away from NSAIDs which have caused GI bleed in the past. On her first day she may has as many as 5 stools and excessive flatulence. She denies melena rectal bleeding nausea or vomiting. She has occasional heartburn. She recently had blood work at [facility] and only thing abnormal was elevated serum creatinine but she states it's remained stable. Patient states she is planning to see Dr. Ronnald Collum to get bone density study. Current Medications: Current Outpatient Prescriptions  Medication Sig Dispense Refill  . alendronate (FOSAMAX) 70 MG tablet Take 70 mg by mouth every 7 (seven) days. Take with a full glass of water on an empty stomach.      . ALPRAZolam (XANAX) 1 MG tablet Take 1 mg by mouth as needed. Patient states that when she takes it she takes 1/2 of a 1 mg      . Cholecalciferol (VITAMIN D PO) Take 1,200 mg by mouth daily.      . Cyanocobalamin (VITAMIN B-12 IJ) Inject as directed every 30 (thirty) days.      . DULoxetine (CYMBALTA) 20 MG capsule Take 60 mg by mouth daily.       Marland Kitchen glucosamine-chondroitin 500-400 MG tablet Take 1 tablet by mouth daily.      . Lactobacillus (ACIDOPHILUS) 100 MG CAPS Take by mouth daily.      Marland Kitchen levothyroxine (SYNTHROID, LEVOTHROID) 100 MCG tablet Take by mouth daily. Patient takes 50 mg daily      . Multiple Vitamin (MULTI-VITAMIN DAILY PO) Take by mouth daily.       Marland Kitchen olmesartan-hydrochlorothiazide (BENICAR HCT) 20-12.5 MG per tablet Take 1 tablet by mouth daily.        . Omega-3 Fatty Acids (FISH OIL) 1000 MG CAPS Take by mouth daily.        Objective: Blood pressure 118/70, pulse 74, temperature 97.7 F (36.5 C), temperature source Oral, resp. rate 14, height 5\' 4"  (1.626 m), weight 125 lb 1.6 oz (56.745 kg).  Conjunctiva is pink. Sclera is nonicteric Oral pharyngeal mucosa is normal. No neck masses or thyromegaly noted. Cardiac exam with regular rhythm normal S1 and S2. No murmur or gallop noted. Lungs are clear to auscultation. Abdomen with multiple scars. Bowel sounds are normal. On palpation soft abdomen without tenderness organomegaly or masses.  No LE edema or clubbing noted.     Labs/studies Results: Lab data from 12/18/2011 reviewed but H&H is missing. LFTs normal with albumin of 4.2  Assessment: Diarrhea secondary to short gut syndrome and small bowel bacterial overgrowth. Severity of her symptoms has gradually decreased as has the need for antibiotic therapy.   Plan: New prescription for metronidazole 250 mg 3 times a day 42 doses with one refill given. Will request another copy of blood work from Dr. Glo Herring office. Office visit in one year unless she has problems.

## 2012-02-10 NOTE — Patient Instructions (Signed)
Call if you have side effects of metronidazole or if it stops working.

## 2012-02-25 ENCOUNTER — Telehealth (INDEPENDENT_AMBULATORY_CARE_PROVIDER_SITE_OTHER): Payer: Self-pay | Admitting: *Deleted

## 2012-02-25 DIAGNOSIS — D649 Anemia, unspecified: Secondary | ICD-10-CM

## 2012-02-25 NOTE — Telephone Encounter (Signed)
Per Dr. Janee Morn after reviewing the patient's labs from PCP, H/H just below normal, should repeat in 3 months. The patient was called and made aware abd she would ike for [name] to repeat in in 3 months. Labs are noted for 05-27-12.

## 2012-03-18 DIAGNOSIS — H35369 Drusen (degenerative) of macula, unspecified eye: Secondary | ICD-10-CM | POA: Diagnosis not present

## 2012-03-18 DIAGNOSIS — H353190 Nonexudative age-related macular degeneration, unspecified eye, stage unspecified: Secondary | ICD-10-CM | POA: Diagnosis not present

## 2012-05-14 ENCOUNTER — Encounter (INDEPENDENT_AMBULATORY_CARE_PROVIDER_SITE_OTHER): Payer: Self-pay | Admitting: *Deleted

## 2012-05-14 ENCOUNTER — Other Ambulatory Visit (INDEPENDENT_AMBULATORY_CARE_PROVIDER_SITE_OTHER): Payer: Self-pay | Admitting: *Deleted

## 2012-05-14 DIAGNOSIS — D649 Anemia, unspecified: Secondary | ICD-10-CM

## 2012-05-17 ENCOUNTER — Telehealth (INDEPENDENT_AMBULATORY_CARE_PROVIDER_SITE_OTHER): Payer: Self-pay | Admitting: *Deleted

## 2012-05-17 DIAGNOSIS — K529 Noninfective gastroenteritis and colitis, unspecified: Secondary | ICD-10-CM

## 2012-05-17 DIAGNOSIS — R531 Weakness: Secondary | ICD-10-CM

## 2012-05-17 DIAGNOSIS — K6389 Other specified diseases of intestine: Secondary | ICD-10-CM

## 2012-05-17 NOTE — Telephone Encounter (Signed)
Patient called office and complains of weakness , per Dr.Rehman the patient will ned the noted labs

## 2012-05-27 DIAGNOSIS — R5381 Other malaise: Secondary | ICD-10-CM | POA: Diagnosis not present

## 2012-05-27 DIAGNOSIS — R197 Diarrhea, unspecified: Secondary | ICD-10-CM | POA: Diagnosis not present

## 2012-05-27 DIAGNOSIS — K6389 Other specified diseases of intestine: Secondary | ICD-10-CM | POA: Diagnosis not present

## 2012-05-27 LAB — CBC WITH DIFFERENTIAL/PLATELET
Hemoglobin: 10.9 g/dL — ABNORMAL LOW (ref 12.0–15.0)
Lymphocytes Relative: 24 % (ref 12–46)
Lymphs Abs: 1.5 10*3/uL (ref 0.7–4.0)
MCH: 28.3 pg (ref 26.0–34.0)
Monocytes Relative: 13 % — ABNORMAL HIGH (ref 3–12)
Neutro Abs: 3.3 10*3/uL (ref 1.7–7.7)
Neutrophils Relative %: 55 % (ref 43–77)
Platelets: 244 10*3/uL (ref 150–400)
RBC: 3.85 MIL/uL — ABNORMAL LOW (ref 3.87–5.11)
WBC: 6.1 10*3/uL (ref 4.0–10.5)

## 2012-05-27 LAB — COMPREHENSIVE METABOLIC PANEL
ALT: 10 U/L (ref 0–35)
Albumin: 4.1 g/dL (ref 3.5–5.2)
CO2: 21 mEq/L (ref 19–32)
Calcium: 9.8 mg/dL (ref 8.4–10.5)
Chloride: 110 mEq/L (ref 96–112)
Glucose, Bld: 84 mg/dL (ref 70–99)
Potassium: 4.5 mEq/L (ref 3.5–5.3)
Sodium: 140 mEq/L (ref 135–145)
Total Bilirubin: 0.3 mg/dL (ref 0.3–1.2)
Total Protein: 6.7 g/dL (ref 6.0–8.3)

## 2012-05-27 LAB — TSH: TSH: 1.846 u[IU]/mL (ref 0.350–4.500)

## 2012-06-12 ENCOUNTER — Emergency Department (HOSPITAL_COMMUNITY)
Admission: EM | Admit: 2012-06-12 | Discharge: 2012-06-13 | Disposition: A | Discharge status: Home / Self Care | Payer: Medicare Other | Source: Home / Self Care | Attending: Emergency Medicine | Admitting: Emergency Medicine

## 2012-06-12 ENCOUNTER — Emergency Department (HOSPITAL_COMMUNITY): Payer: Medicare Other

## 2012-06-12 ENCOUNTER — Encounter (HOSPITAL_COMMUNITY): Payer: Self-pay

## 2012-06-12 DIAGNOSIS — R10819 Abdominal tenderness, unspecified site: Secondary | ICD-10-CM | POA: Insufficient documentation

## 2012-06-12 DIAGNOSIS — R110 Nausea: Secondary | ICD-10-CM | POA: Insufficient documentation

## 2012-06-12 DIAGNOSIS — R0989 Other specified symptoms and signs involving the circulatory and respiratory systems: Secondary | ICD-10-CM | POA: Insufficient documentation

## 2012-06-12 DIAGNOSIS — R109 Unspecified abdominal pain: Secondary | ICD-10-CM | POA: Diagnosis not present

## 2012-06-12 DIAGNOSIS — I10 Essential (primary) hypertension: Secondary | ICD-10-CM | POA: Insufficient documentation

## 2012-06-12 DIAGNOSIS — K5900 Constipation, unspecified: Secondary | ICD-10-CM | POA: Diagnosis not present

## 2012-06-12 DIAGNOSIS — N200 Calculus of kidney: Secondary | ICD-10-CM | POA: Diagnosis not present

## 2012-06-12 DIAGNOSIS — K56609 Unspecified intestinal obstruction, unspecified as to partial versus complete obstruction: Secondary | ICD-10-CM | POA: Diagnosis not present

## 2012-06-12 LAB — CBC WITH DIFFERENTIAL/PLATELET
Basophils Absolute: 0 10*3/uL (ref 0.0–0.1)
Basophils Relative: 0 % (ref 0–1)
HCT: 30.6 % — ABNORMAL LOW (ref 36.0–46.0)
MCHC: 33 g/dL (ref 30.0–36.0)
Monocytes Absolute: 0.8 10*3/uL (ref 0.1–1.0)
Neutro Abs: 5 10*3/uL (ref 1.7–7.7)
Platelets: 180 10*3/uL (ref 150–400)
RDW: 14.2 % (ref 11.5–15.5)
WBC: 7.9 10*3/uL (ref 4.0–10.5)

## 2012-06-12 MED ORDER — ONDANSETRON HCL 4 MG/2ML IJ SOLN
4.0000 mg | Freq: Once | INTRAMUSCULAR | Status: AC
Start: 2012-06-12 — End: 2012-06-12
  Administered 2012-06-12: 4 mg via INTRAVENOUS
  Filled 2012-06-12: qty 2

## 2012-06-12 MED ORDER — HYDROMORPHONE HCL PF 1 MG/ML IJ SOLN
1.0000 mg | Freq: Once | INTRAMUSCULAR | Status: AC
  Administered 2012-06-12: 1 mg via INTRAVENOUS
  Filled 2012-06-12: qty 1

## 2012-06-12 NOTE — ED Provider Notes (Signed)
History   This chart was scribed for [employer]. Olin Hauser, MD by Malen Gauze. The patient was seen in room APA10/APA10 and the patient's care was started at 11:28PM.    CSN: [id number]  Arrival date & time 06/12/12  2236   First MD Initiated Contact with Patient 06/12/12 2305      Chief Complaint  Patient presents with  . Abdominal Pain    (Consider location/radiation/quality/duration/timing/severity/associated sxs/prior treatment) HPI Jane Andrews is a 78 y.o. female who presents to the Emergency Department complaining of constant, moderate to severe abdominal pan with an onset 5:00PM. Patient had eaten barbeque, corn bread, coleslaw, peach short cake for supper .Pt has Hx of abdominal adhesions and abdominal surgeries; last abd surgery was in 1993. Pt also c/o of back pain and states that it is related to the abdominal pain. No HA, fever, neck pain, sore throat, rash, back pain, CP, SOB, vomiting, diarrhea, dysuria,  Weakness.   PCP: Dr. Orson Ape Past Medical History  Diagnosis Date  . Hypertension   . Thyroid condition     Past Surgical History  Procedure Date  . Abdominal hysterectomy   . Abdominal surgery   . Colonoscopy   . Upper gastrointestinal endoscopy     Family History  Problem Relation Age of Onset  . Anuerysm Father   . Rheum arthritis Sister   . Healthy Sister   . COPD Sister   . Healthy Brother     History  Substance Use Topics  . Smoking status: Never Smoker   . Smokeless tobacco: Never Used  . Alcohol Use: No    OB History    Grav Para Term Preterm Abortions TAB SAB Ect Mult Living                  Review of Systems  Constitutional: Negative for fever.       10 Systems reviewed and are negative for acute change except as noted in the HPI.  HENT: Negative for congestion.   Eyes: Negative for discharge and redness.  Respiratory: Negative for cough and shortness of breath.   Cardiovascular: Negative for chest pain.  Gastrointestinal:  Positive for nausea and abdominal pain. Negative for vomiting.  Musculoskeletal: Negative for back pain.  Skin: Negative for rash.  Neurological: Negative for syncope, numbness and headaches.  Psychiatric/Behavioral:       No behavior change.     Allergies  Review of patient's allergies indicates no known allergies.  Home Medications   Current Outpatient Rx  Name Route Sig Dispense Refill  . DULOXETINE HCL 60 MG PO CPEP Oral Take 60 mg by mouth daily.    . ALENDRONATE SODIUM 70 MG PO TABS Oral Take 70 mg by mouth every 7 (seven) days. Take with a full glass of water on an empty stomach.    . ALPRAZOLAM 1 MG PO TABS Oral Take 1 mg by mouth as needed. Patient states that when she takes it she takes 1/2 of a 1 mg    . VITAMIN D PO Oral Take 1,200 mg by mouth daily.    Marland Kitchen VITAMIN B-12 IJ Injection Inject as directed every 30 (thirty) days.    Marland Kitchen GLUCOSAMINE-CHONDROITIN 500-400 MG PO TABS Oral Take 1 tablet by mouth daily.    . ACIDOPHILUS 100 MG PO CAPS Oral Take by mouth daily.    Marland Kitchen LEVOTHYROXINE SODIUM 100 MCG PO TABS Oral Take by mouth daily. Patient takes 50 mg daily    . MULTI-VITAMIN DAILY PO  Oral Take by mouth daily.    Marland Kitchen OLMESARTAN MEDOXOMIL-HCTZ 20-12.5 MG PO TABS Oral Take 1 tablet by mouth daily.      Marland Kitchen FISH OIL 1000 MG PO CAPS Oral Take by mouth daily.      Pulse 77  Temp 97.6 F (36.4 C) (Oral)  Resp 18  Ht 5\' 4"  (1.626 m)  Wt 120 lb (54.432 kg)  BMI 20.60 kg/m2  SpO2 100%  Physical Exam  Nursing note and vitals reviewed. Constitutional: She is oriented to person, place, and time. She appears well-developed and well-nourished. No distress.  HENT:  Head: Normocephalic and atraumatic.  Right Ear: External ear normal.  Left Ear: External ear normal.  Eyes: EOM are normal.  Neck: Normal range of motion. No tracheal deviation present.  Cardiovascular: Normal rate.   Pulmonary/Chest: Effort normal. No respiratory distress. She has rales (crackles at bilateral bases).    Abdominal: Soft. There is tenderness (Mild upper abd TTP).       Bowel sounds are high-pitched.  Musculoskeletal: Normal range of motion. She exhibits no edema and no tenderness.  Neurological: She is alert and oriented to person, place, and time.  Skin: Skin is warm and dry. No rash noted.  Psychiatric: She has a normal mood and affect. Her behavior is normal.    ED Course  Procedures (including critical care time)  DIAGNOSTIC STUDIES: Oxygen Saturation is 100% on room air, normal by my interpretation.    COORDINATION OF CARE:  11:32PM - EDMD will order CXR, zofran, dilaudid, and blood w/u for the pt.  Results for orders placed during the hospital encounter of 06/12/12  CBC WITH DIFFERENTIAL      Component Value Range   WBC 7.9  4.0 - 10.5 K/uL   RBC 3.47 (*) 3.87 - 5.11 MIL/uL   Hemoglobin 10.1 (*) 12.0 - 15.0 g/dL   HCT 30.6 (*) 36.0 - 46.0 %   MCV 88.2  78.0 - 100.0 fL   MCH 29.1  26.0 - 34.0 pg   MCHC 33.0  30.0 - 36.0 g/dL   RDW 14.2  11.5 - 15.5 %   Platelets 180  150 - 400 K/uL   Neutrophils Relative 64  43 - 77 %   Neutro Abs 5.0  1.7 - 7.7 K/uL   Lymphocytes Relative 19  12 - 46 %   Lymphs Abs 1.5  0.7 - 4.0 K/uL   Monocytes Relative 11  3 - 12 %   Monocytes Absolute 0.8  0.1 - 1.0 K/uL   Eosinophils Relative 6 (*) 0 - 5 %   Eosinophils Absolute 0.5  0.0 - 0.7 K/uL   Basophils Relative 0  0 - 1 %   Basophils Absolute 0.0  0.0 - 0.1 K/uL  COMPREHENSIVE METABOLIC PANEL      Component Value Range   Sodium 139  135 - 145 mEq/L   Potassium 5.1  3.5 - 5.1 mEq/L   Chloride 105  96 - 112 mEq/L   CO2 25  19 - 32 mEq/L   Glucose, Bld 118 (*) 70 - 99 mg/dL   BUN 30 (*) 6 - 23 mg/dL   Creatinine, Ser 1.55 (*) 0.50 - 1.10 mg/dL   Calcium 9.8  8.4 - 10.5 mg/dL   Total Protein 6.7  6.0 - 8.3 g/dL   Albumin 3.5  3.5 - 5.2 g/dL   AST 13  0 - 37 U/L   ALT 10  0 - 35 U/L   Alkaline Phosphatase  59  39 - 117 U/L   Total Bilirubin 0.2 (*) 0.3 - 1.2 mg/dL   GFR calc non  Af Amer 30 (*) >90 mL/min   GFR calc Af Amer 34 (*) >90 mL/min   Dg Abd Acute W/chest  06/13/2012  *RADIOLOGY REPORT*  Clinical Data: Abdominal pain for 2 hours, history hypertension  ACUTE ABDOMEN SERIES (ABDOMEN 2 VIEW & CHEST 1 VIEW)  Comparison: Chest radiograph 08/26/2011, abdominal radiograph 10/16/2004  Findings: Upper normal heart size. Mediastinal contours and pulmonary vascularity normal. Lungs clear. No pleural effusion or pneumothorax. Prior right mastectomy and axillary node dissection. Thoracolumbar scoliosis. Questionable developing nodular density lower left chest 12 x 8 mm. Large irregular calcific density projects over expected position of the right renal pelvis consistent with staghorn calculus 3.5 x 1.6 cm. Slightly prominent stool throughout colon. No bowel dilatation, evidence of obstruction, or free intraperitoneal air. Bones appear demineralized.  IMPRESSION: Increased stool in colon. Staghorn calculus right kidney. Question developing left lung base nodule 12 x 8 mm; follow-up CT chest recommended to exclude pulmonary nodule.  Original Report Authenticated By: Burnetta Sabin, M.D.    MDM  Patient with h/o bowel obstruction in the past who hs developed abdominal pain with radiation to her back 2 hours after eating a meal. Labs are unremarkable. Chronic anemia still present. Acute abdominal series without obstructive pattern. Patient given IVF, analgesics and antiemetics with relief.Dx testing d/w pt and family.  Questions answered.  Verb understanding, agreeable to d/c home with outpt f/u.  Pt feels improved after observation and/or treatment in ED.Pt stable in ED with no significant deterioration in condition.The patient appears reasonably screened and/or stabilized for discharge and I doubt any other medical condition or other [facility] requiring further screening, evaluation, or treatment in the ED at this time prior to discharge.  I personally performed the services described in this  documentation, which was scribed in my presence. The recorded information has been reviewed and considered.   MDM Reviewed: nursing note and vitals Interpretation: labs and x-ray           Gypsy Balsam. Olin Hauser, MD 06/13/12 2000

## 2012-06-12 NOTE — ED Notes (Signed)
Having pain in stomach and intestines per pt. Started 2 hours ago per pt. Denies n/v/d.

## 2012-06-13 ENCOUNTER — Inpatient Hospital Stay (HOSPITAL_COMMUNITY)
Admission: EM | Admit: 2012-06-13 | Discharge: 2012-06-17 | DRG: 390 | Disposition: A | Discharge status: Home / Self Care | Payer: Medicare Other | Attending: General Surgery | Admitting: General Surgery

## 2012-06-13 ENCOUNTER — Emergency Department (HOSPITAL_COMMUNITY): Payer: Medicare Other

## 2012-06-13 ENCOUNTER — Encounter (HOSPITAL_COMMUNITY): Payer: Self-pay | Admitting: Emergency Medicine

## 2012-06-13 DIAGNOSIS — Z9010 Acquired absence of unspecified breast and nipple: Secondary | ICD-10-CM | POA: Diagnosis not present

## 2012-06-13 DIAGNOSIS — N200 Calculus of kidney: Secondary | ICD-10-CM | POA: Diagnosis not present

## 2012-06-13 DIAGNOSIS — Z87891 Personal history of nicotine dependence: Secondary | ICD-10-CM | POA: Diagnosis not present

## 2012-06-13 DIAGNOSIS — R109 Unspecified abdominal pain: Secondary | ICD-10-CM | POA: Diagnosis not present

## 2012-06-13 DIAGNOSIS — K56609 Unspecified intestinal obstruction, unspecified as to partial versus complete obstruction: Secondary | ICD-10-CM | POA: Diagnosis not present

## 2012-06-13 DIAGNOSIS — K5900 Constipation, unspecified: Secondary | ICD-10-CM | POA: Diagnosis not present

## 2012-06-13 DIAGNOSIS — Z79899 Other long term (current) drug therapy: Secondary | ICD-10-CM

## 2012-06-13 DIAGNOSIS — I10 Essential (primary) hypertension: Secondary | ICD-10-CM | POA: Diagnosis not present

## 2012-06-13 HISTORY — DX: Peritoneal adhesions (postprocedural) (postinfection): K66.0

## 2012-06-13 LAB — COMPREHENSIVE METABOLIC PANEL
ALT: 10 U/L (ref 0–35)
ALT: 11 U/L (ref 0–35)
AST: 13 U/L (ref 0–37)
AST: 16 U/L (ref 0–37)
Albumin: 3.5 g/dL (ref 3.5–5.2)
Albumin: 3.9 g/dL (ref 3.5–5.2)
Alkaline Phosphatase: 63 U/L (ref 39–117)
CO2: 28 mEq/L (ref 19–32)
Calcium: 9.8 mg/dL (ref 8.4–10.5)
Chloride: 100 mEq/L (ref 96–112)
Chloride: 105 mEq/L (ref 96–112)
Creatinine, Ser: 1.55 mg/dL — ABNORMAL HIGH (ref 0.50–1.10)
GFR calc non Af Amer: 32 mL/min — ABNORMAL LOW (ref >90)
Potassium: 4.4 mEq/L (ref 3.5–5.1)
Sodium: 139 mEq/L (ref 135–145)
Sodium: 140 mEq/L (ref 135–145)
Total Bilirubin: 0.4 mg/dL (ref 0.3–1.2)

## 2012-06-13 LAB — URINALYSIS, ROUTINE W REFLEX MICROSCOPIC
Glucose, UA: NEGATIVE mg/dL
Ketones, ur: NEGATIVE mg/dL
Leukocytes, UA: NEGATIVE
pH: 6 (ref 5.0–8.0)

## 2012-06-13 LAB — URINE MICROSCOPIC-ADD ON

## 2012-06-13 LAB — CBC
Platelets: 206 10*3/uL (ref 150–400)
RBC: 3.9 MIL/uL (ref 3.87–5.11)
RDW: 14.2 % (ref 11.5–15.5)
WBC: 8.5 10*3/uL (ref 4.0–10.5)

## 2012-06-13 MED ORDER — SODIUM CHLORIDE 0.9 % IV SOLN
INTRAVENOUS | Status: AC
  Administered 2012-06-13: 22:00:00 via INTRAVENOUS

## 2012-06-13 MED ORDER — ONDANSETRON HCL 4 MG/2ML IJ SOLN: 4.0000 mg | Freq: Three times a day (TID) | INTRAMUSCULAR | Status: DC | PRN

## 2012-06-13 MED ORDER — ONDANSETRON HCL 4 MG/2ML IJ SOLN
4.0000 mg | Freq: Once | INTRAMUSCULAR | Status: AC
  Administered 2012-06-13: 4 mg via INTRAVENOUS
  Filled 2012-06-13: qty 2

## 2012-06-13 MED ORDER — HYDROCODONE-ACETAMINOPHEN 5-325 MG PO TABS: 1.0000 | ORAL_TABLET | ORAL | Status: AC | PRN

## 2012-06-13 MED ORDER — SODIUM CHLORIDE 0.9 % IV BOLUS (SEPSIS)
500.0000 mL | Freq: Once | INTRAVENOUS | Status: AC
  Administered 2012-06-13: 500 mL via INTRAVENOUS

## 2012-06-13 MED ORDER — ONDANSETRON HCL 4 MG/2ML IJ SOLN
4.0000 mg | Freq: Once | INTRAMUSCULAR | Status: AC
Start: 2012-06-13 — End: 2012-06-13
  Administered 2012-06-13: 4 mg via INTRAVENOUS

## 2012-06-13 MED ORDER — ONDANSETRON HCL 4 MG/2ML IJ SOLN: 4.0000 mg | Freq: Four times a day (QID) | INTRAMUSCULAR | Status: DC | PRN

## 2012-06-13 MED ORDER — PANTOPRAZOLE SODIUM 40 MG IV SOLR
40.0000 mg | Freq: Once | INTRAVENOUS | Status: AC
  Administered 2012-06-13: 40 mg via INTRAVENOUS
  Filled 2012-06-13: qty 40

## 2012-06-13 MED ORDER — ONDANSETRON HCL 4 MG PO TABS: 4.0000 mg | ORAL_TABLET | Freq: Four times a day (QID) | ORAL | Status: AC

## 2012-06-13 MED ORDER — IOHEXOL 300 MG/ML  SOLN
100.0000 mL | Freq: Once | INTRAMUSCULAR | Status: AC | PRN
  Administered 2012-06-13: 100 mL via INTRAVENOUS

## 2012-06-13 MED ORDER — ONDANSETRON HCL 4 MG/2ML IJ SOLN
INTRAMUSCULAR | Status: AC
  Administered 2012-06-13: 4 mg
  Filled 2012-06-13: qty 2

## 2012-06-13 MED ORDER — HYDROMORPHONE HCL PF 1 MG/ML IJ SOLN
1.0000 mg | INTRAMUSCULAR | Status: DC | PRN
  Administered 2012-06-14 – 2012-06-15 (×2): 1 mg via INTRAVENOUS
  Filled 2012-06-13 (×2): qty 1

## 2012-06-13 MED ORDER — PANTOPRAZOLE SODIUM 40 MG IV SOLR
40.0000 mg | Freq: Every day | INTRAVENOUS | Status: DC
  Administered 2012-06-14 – 2012-06-16 (×3): 40 mg via INTRAVENOUS
  Filled 2012-06-13 (×3): qty 40

## 2012-06-13 MED ORDER — HYDROMORPHONE HCL PF 1 MG/ML IJ SOLN
1.0000 mg | Freq: Once | INTRAMUSCULAR | Status: AC
  Administered 2012-06-13: 1 mg via INTRAVENOUS
  Filled 2012-06-13: qty 1

## 2012-06-13 MED ORDER — LACTATED RINGERS IV SOLN
INTRAVENOUS | Status: DC
  Administered 2012-06-13 – 2012-06-16 (×6): via INTRAVENOUS

## 2012-06-13 MED ORDER — ENOXAPARIN SODIUM 40 MG/0.4ML ~~LOC~~ SOLN
40.0000 mg | SUBCUTANEOUS | Status: DC
  Administered 2012-06-13: 40 mg via SUBCUTANEOUS
  Filled 2012-06-13: qty 0.4

## 2012-06-13 MED ORDER — ONDANSETRON HCL 4 MG/2ML IJ SOLN: 4.0000 mg | Freq: Once | INTRAMUSCULAR | Status: DC

## 2012-06-13 MED ORDER — HYDROMORPHONE HCL PF 1 MG/ML IJ SOLN
0.5000 mg | Freq: Once | INTRAMUSCULAR | Status: AC
  Administered 2012-06-13: 0.5 mg via INTRAVENOUS
  Filled 2012-06-13: qty 1

## 2012-06-13 NOTE — ED Provider Notes (Signed)
History     CSN: [id number]  Arrival date & time 06/13/12  1825   First MD Initiated Contact with Patient 06/13/12 1830      Chief Complaint  Patient presents with  . Abdominal Pain  . Emesis    (Consider location/radiation/quality/duration/timing/severity/associated sxs/prior treatment) Patient is a 78 y.o. female presenting with abdominal pain and vomiting. The history is provided by the patient.  Abdominal Pain The primary symptoms of the illness include abdominal pain and vomiting. The primary symptoms of the illness do not include fever, shortness of breath, diarrhea or dysuria.  Symptoms associated with the illness do not include chills, constipation or back pain.  Emesis  Associated symptoms include abdominal pain. Pertinent negatives include no chills, no cough, no diarrhea, no fever and no headaches.  pt c/o abdominal pain since yesterday evening. Intermittent, epigastric to upper abdomen. Occasionally radiates to back. No consistent exacerbating or alleviating factors. No change w eating. Has had couple episodes nv, color of recently ingested food/liquid, no bloody or bilious emesis. Did have 2 bms today, normal per pt. No fever or chills. Pt w hx sbo due to adhesions/exp lap w loa and sm resection for same in past, hx gastritis/sb ulcer. Pt denies hx gallstones, but noted on prior ultrasound. Denies hx pancreatitis. Denies gu c/o.  Was in ed w same last pm, states was told pain possibly due to ulcer.   Past Medical History  Diagnosis Date  . Hypertension   . Thyroid condition   . Abdominal adhesions     Past Surgical History  Procedure Date  . Abdominal hysterectomy   . Abdominal surgery   . Colonoscopy   . Upper gastrointestinal endoscopy     Family History  Problem Relation Age of Onset  . Anuerysm Father   . Rheum arthritis Sister   . Healthy Sister   . COPD Sister   . Healthy Brother     History  Substance Use Topics  . Smoking status: Never Smoker     . Smokeless tobacco: Never Used  . Alcohol Use: No    OB History    Grav Para Term Preterm Abortions TAB SAB Ect Mult Living                  Review of Systems  Constitutional: Negative for fever and chills.  HENT: Negative for neck pain.   Eyes: Negative for redness.  Respiratory: Negative for cough and shortness of breath.   Cardiovascular: Negative for chest pain.  Gastrointestinal: Positive for vomiting and abdominal pain. Negative for diarrhea and constipation.  Genitourinary: Negative for dysuria and flank pain.  Musculoskeletal: Negative for back pain.  Skin: Negative for rash.  Neurological: Negative for headaches.  Hematological: Does not bruise/bleed easily.  Psychiatric/Behavioral: Negative for confusion.    Allergies  Review of patient's allergies indicates no known allergies.  Home Medications   Current Outpatient Rx  Name Route Sig Dispense Refill  . ALENDRONATE SODIUM 70 MG PO TABS Oral Take 70 mg by mouth every 7 (seven) days. Take with a full glass of water on an empty stomach.    . ALPRAZOLAM 1 MG PO TABS Oral Take 1 mg by mouth as needed. Patient states that when she takes it she takes 1/2 of a 1 mg    . VITAMIN D PO Oral Take 1,200 mg by mouth daily.    Marland Kitchen VITAMIN B-12 IJ Injection Inject as directed every 30 (thirty) days.    . DULOXETINE HCL 60  MG PO CPEP Oral Take 60 mg by mouth daily.    Marland Kitchen GLUCOSAMINE-CHONDROITIN 500-400 MG PO TABS Oral Take 1 tablet by mouth daily.    Marland Kitchen HYDROCODONE-ACETAMINOPHEN 5-325 MG PO TABS Oral Take 1 tablet by mouth every 4 (four) hours as needed for pain. 10 tablet 0  . ACIDOPHILUS 100 MG PO CAPS Oral Take by mouth daily.    Marland Kitchen LEVOTHYROXINE SODIUM 100 MCG PO TABS Oral Take by mouth daily. Patient takes 50 mg daily    . MULTI-VITAMIN DAILY PO Oral Take by mouth daily.    Marland Kitchen OLMESARTAN MEDOXOMIL-HCTZ 20-12.5 MG PO TABS Oral Take 1 tablet by mouth daily.      Marland Kitchen FISH OIL 1000 MG PO CAPS Oral Take by mouth daily.    Marland Kitchen  ONDANSETRON HCL 4 MG PO TABS Oral Take 1 tablet (4 mg total) by mouth every 6 (six) hours. 12 tablet 0    BP 105/55  Pulse 87  Temp 98.5 F (36.9 C) (Oral)  Resp 18  Ht 5\' 4"  (1.626 m)  Wt 120 lb (54.432 kg)  BMI 20.60 kg/m2  SpO2 95%  Physical Exam  Nursing note and vitals reviewed. Constitutional: She appears well-developed and well-nourished. No distress.  HENT:  Mouth/Throat: Oropharynx is clear and moist.  Eyes: Conjunctivae are normal. No scleral icterus.  Neck: Neck supple. No tracheal deviation present.  Cardiovascular: Normal rate, regular rhythm, normal heart sounds and intact distal pulses.   Pulmonary/Chest: Effort normal and breath sounds normal. No respiratory distress.  Abdominal: Soft. Normal appearance and bowel sounds are normal. She exhibits no distension and no mass. There is tenderness. There is no rebound and no guarding.       Epigastric tenderness, no rebound or guarding. Well healed surgical scars, no incarc hernia. No puls mass.   Genitourinary:       No cva tenderness  Musculoskeletal: She exhibits no edema and no tenderness.  Neurological: She is alert.  Skin: Skin is warm and dry. No rash noted.  Psychiatric: She has a normal mood and affect.    ED Course  Procedures (including critical care time)  Results for orders placed during the hospital encounter of 06/13/12  CBC      Component Value Range   WBC 8.5  4.0 - 10.5 K/uL   RBC 3.90  3.87 - 5.11 MIL/uL   Hemoglobin 11.4 (*) 12.0 - 15.0 g/dL   HCT 34.4 (*) 36.0 - 46.0 %   MCV 88.2  78.0 - 100.0 fL   MCH 29.2  26.0 - 34.0 pg   MCHC 33.1  30.0 - 36.0 g/dL   RDW 14.2  11.5 - 15.5 %   Platelets 206  150 - 400 K/uL  COMPREHENSIVE METABOLIC PANEL      Component Value Range   Sodium 140  135 - 145 mEq/L   Potassium 4.4  3.5 - 5.1 mEq/L   Chloride 100  96 - 112 mEq/L   CO2 28  19 - 32 mEq/L   Glucose, Bld 128 (*) 70 - 99 mg/dL   BUN 26 (*) 6 - 23 mg/dL   Creatinine, Ser 1.46 (*) 0.50 - 1.10  mg/dL   Calcium 10.4  8.4 - 10.5 mg/dL   Total Protein 7.3  6.0 - 8.3 g/dL   Albumin 3.9  3.5 - 5.2 g/dL   AST 16  0 - 37 U/L   ALT 11  0 - 35 U/L   Alkaline Phosphatase 63  39 -  117 U/L   Total Bilirubin 0.4  0.3 - 1.2 mg/dL   GFR calc non Af Amer 32 (*) >90 mL/min   GFR calc Af Amer 37 (*) >90 mL/min  LIPASE, BLOOD      Component Value Range   Lipase 40  11 - 59 U/L  URINALYSIS, ROUTINE W REFLEX MICROSCOPIC      Component Value Range   Color, Urine YELLOW  YELLOW   APPearance CLEAR  CLEAR   Specific Gravity, Urine 1.015  1.005 - 1.030   pH 6.0  5.0 - 8.0   Glucose, UA NEGATIVE  NEGATIVE mg/dL   Hgb urine dipstick LARGE (*) NEGATIVE   Bilirubin Urine NEGATIVE  NEGATIVE   Ketones, ur NEGATIVE  NEGATIVE mg/dL   Protein, ur NEGATIVE  NEGATIVE mg/dL   Urobilinogen, UA 0.2  0.0 - 1.0 mg/dL   Nitrite NEGATIVE  NEGATIVE   Leukocytes, UA NEGATIVE  NEGATIVE  URINE MICROSCOPIC-ADD ON      Component Value Range   Squamous Epithelial / LPF FEW (*) RARE   WBC, UA 3-6  <3 WBC/hpf   RBC / HPF 21-50  <3 RBC/hpf   Ct Abdomen Pelvis W Contrast  06/13/2012  *RADIOLOGY REPORT*  Clinical Data: Abdominal pain  CT ABDOMEN AND PELVIS WITH CONTRAST  Technique:  Multidetector CT imaging of the abdomen and pelvis was performed following the standard protocol during bolus administration of intravenous contrast.  Contrast: 1107mL OMNIPAQUE IOHEXOL 300 MG/ML  SOLN  Comparison: 06/13/2012 abdominal series, 01/08/2005 CT  Findings: Right breast prosthesis.  Minimal linear opacity within the lingula is likely scarring or atelectasis.  Normal heart size. No pleural or pericardial effusion.  Low attenuation of the liver is nonspecific but can be seen with fatty infiltration.  There are a couple hypodensities within the liver, incompletely characterized however favored to reflect biliary cysts or hamartomas.  Unremarkable spleen, pancreas, and adrenal glands.    Gallstone layers dependently.  No gallbladder wall  thickening or pericholecystic fluid.  There are bilateral renal hypodensities the larger of which are in keeping with cysts.  The smaller are too small to further characterize.  Tiny nonobstructing stones bilaterally.  Large staghorn calculus filling lower pole calyces and extending to the renal pelvis on the right.  No hydronephrosis or hydroureter.  There is small bowel dilatation up to 4.3 cm to the level of the right lower quadrant where there is a small bowel feces sign and abrupt transition in the region of a bowel anastomosis suture. There is interloop fluid and mesenteric fat stranding.  There is scattered atherosclerotic calcification of the aorta and its branches. No aneurysmal dilatation.  Partially decompressed bladder.  Absent uterus.  No adnexal mass.  Multilevel degenerative changes. The questionable nodule described on today's radiograph actually corresponds to a focal sclerosis within the anterior sixth rib.  IMPRESSION: Small bowel obstruction with transition in the right lower quadrant in proximity to bowel suture.  Bilateral nonobstructing renal stones including a staghorn calculus in the lower pole on the right.  The questionable nodule described on today's radiograph actually corresponds to a focal sclerosis within the anterior sixth rib. This may correspond to prior trauma/healed rib fracture.  If there is concern for metastatic disease, bone scan could be obtained.  Original Report Authenticated By: Suanne Marker, M.D.   Dg Abd Acute W/chest  06/13/2012  *RADIOLOGY REPORT*  Clinical Data: Abdominal pain for 2 hours, history hypertension  ACUTE ABDOMEN SERIES (ABDOMEN 2 VIEW & CHEST 1  VIEW)  Comparison: Chest radiograph 08/26/2011, abdominal radiograph 10/16/2004  Findings: Upper normal heart size. Mediastinal contours and pulmonary vascularity normal. Lungs clear. No pleural effusion or pneumothorax. Prior right mastectomy and axillary node dissection. Thoracolumbar scoliosis.  Questionable developing nodular density lower left chest 12 x 8 mm. Large irregular calcific density projects over expected position of the right renal pelvis consistent with staghorn calculus 3.5 x 1.6 cm. Slightly prominent stool throughout colon. No bowel dilatation, evidence of obstruction, or free intraperitoneal air. Bones appear demineralized.  IMPRESSION: Increased stool in colon. Staghorn calculus right kidney. Question developing left lung base nodule 12 x 8 mm; follow-up CT chest recommended to exclude pulmonary nodule.  Original Report Authenticated By: Burnetta Sabin, M.D.       MDM  Iv ns bolus. zofran iv. protonix iv.   Pt requests pain rx. No change in abd exam.  Dilaudid .5 mg iv. Additional ns iv. No recurrent nv.  Ct results noted. Discussed w gen surg on call.   Discussed pt, hx, ct, etc with Dr Geroge Baseman, he states will admit.      Mirna Mires, MD 06/13/12 2135

## 2012-06-13 NOTE — ED Notes (Signed)
Patient with c/o abdominal pain and vomiting since yesterday. Patient reports she vomits after she eats.

## 2012-06-13 NOTE — Discharge Instructions (Signed)
Your blood work was normal here tonight. Your xrays did not show any blockage. There is a lot of stool and gas in the colon. The best way to clear the gas is to have several bowel movements. Use the pain and nausea medicine as needed. Follow up with your doctor.

## 2012-06-13 NOTE — ED Notes (Signed)
Jane Andrews called and notified me that she had spoke with Dr. Shonna Chock and that he had stated for the patient to return to ED if she continued to be as sick as she was yesterday. Dr. Shonna Chock stated that he did not tell the patient he wanted her to be admitted.

## 2012-06-13 NOTE — ED Notes (Signed)
Pt states she was here for same last night. Pt states she called GI MD today and was told to come here for admission. Pt states relief of pain after vomiting. Pt also states she has had two BMs today.

## 2012-06-13 NOTE — ED Notes (Signed)
Pt also states she only has four feet of small intestines left after having 21 ft removed due to adhesions.

## 2012-06-13 NOTE — ED Notes (Signed)
Patient finished with oral CT contrast.

## 2012-06-14 LAB — BASIC METABOLIC PANEL
BUN: 23 mg/dL (ref 6–23)
CO2: 26 mEq/L (ref 19–32)
Calcium: 8.6 mg/dL (ref 8.4–10.5)
GFR calc non Af Amer: 34 mL/min — ABNORMAL LOW (ref >90)
Glucose, Bld: 94 mg/dL (ref 70–99)

## 2012-06-14 LAB — CBC
MCH: 28.8 pg (ref 26.0–34.0)
MCHC: 32.4 g/dL (ref 30.0–36.0)
MCV: 89.1 fL (ref 78.0–100.0)
Platelets: 152 10*3/uL (ref 150–400)
RBC: 3.12 MIL/uL — ABNORMAL LOW (ref 3.87–5.11)

## 2012-06-14 MED ORDER — ENOXAPARIN SODIUM 30 MG/0.3ML ~~LOC~~ SOLN
30.0000 mg | SUBCUTANEOUS | Status: DC
Start: 2012-06-14 — End: 2012-06-17
  Administered 2012-06-14 – 2012-06-16 (×3): 30 mg via SUBCUTANEOUS
  Filled 2012-06-14 (×3): qty 0.3

## 2012-06-14 MED ORDER — SODIUM CHLORIDE 0.9 % IJ SOLN
INTRAMUSCULAR | Status: AC
  Administered 2012-06-14: 10 mL
  Filled 2012-06-14: qty 3

## 2012-06-14 MED ORDER — SODIUM CHLORIDE 0.9 % IJ SOLN
INTRAMUSCULAR | Status: AC
  Filled 2012-06-14: qty 6

## 2012-06-14 MED ORDER — SODIUM CHLORIDE 0.9 % IJ SOLN
INTRAMUSCULAR | Status: AC
  Administered 2012-06-14: 3 mL
  Filled 2012-06-14: qty 3

## 2012-06-14 NOTE — Care Management Note (Unsigned)
    Page 1 of 1   06/14/2012     1:16:00 PM   CARE MANAGEMENT NOTE 06/14/2012  Patient:  Jane Andrews, Jane Andrews   Account Number:  [id number]  Date Initiated:  06/14/2012  Documentation initiated by:  Theophilus Kinds  Subjective/Objective Assessment:   Pt admitted from home with small bowel obst. Pt lives alone and will return home at discharge. Pt has a neighbor who checks on her intermittently. Pt is independent with ADL's.     Action/Plan:   No CM needs noted.   Anticipated DC Date:  06/17/2012   Anticipated DC Plan:  [place]  CM consult      Choice offered to / List presented to:             Status of service:  Completed, signed off Medicare Important Message given?   (If response is "NO", the following Medicare IM given date fields will be blank) Date Medicare IM given:   Date Additional Medicare IM given:    Discharge Disposition:    Per UR Regulation:    If discussed at Long Length of Stay Meetings, dates discussed:    Comments:  06/14/12 [place], RN BSN CM

## 2012-06-14 NOTE — Progress Notes (Signed)
UR Chart Review Completed  

## 2012-06-14 NOTE — H&P (Signed)
Jane Andrews is an 78 y.o. female.   Chief Complaint: Nausea vomiting and diffuse abdominal pain. HPI: Patient presented to [facility] emergency department with diffuse abdominal pain. This pain started last night after eating. She describes the pain as colicky and diffuse. She has had similar symptomatology in the past with previous bowel obstructions. She was passing flatus and did have a large bowel movement yesterday. Bowel movement was reported as normal. No melena no hematochezia. No change with urination. Her last colonoscopy was several years ago by Dr. Laural Golden and was reported as normal. Nausea is worse with eating but currently is improved with bowel rest. She denies any fevers or chills.  Past Medical History  Diagnosis Date  . Hypertension   . Thyroid condition   . Abdominal adhesions     Past Surgical History  Procedure Date  . Abdominal hysterectomy   . Abdominal surgery   . Colonoscopy   . Upper gastrointestinal endoscopy   . Mastectomy right breast    Family History  Problem Relation Age of Onset  . Anuerysm Father   . Rheum arthritis Sister   . Healthy Sister   . COPD Sister   . Healthy Brother    Social History:  reports that she has quit smoking. Her smoking use included Cigarettes. She has a 30 pack-year smoking history. She has never used smokeless tobacco. She reports that she does not drink alcohol or use illicit drugs.  Allergies:  Allergies  Allergen Reactions  . Codeine Nausea And Vomiting    Medications Prior to Admission  Medication Sig Dispense Refill  . alendronate (FOSAMAX) 70 MG tablet Take 70 mg by mouth every 7 (seven) days. Take with a full glass of water on an empty stomach. Saturday      . ALPRAZolam (XANAX) 1 MG tablet Take 0.5 mg by mouth as needed. Patient states that when she takes it she takes 1/2 of a 1 mg      . Cholecalciferol (VITAMIN D PO) Take 1,200 mg by mouth daily.      . Cyanocobalamin (VITAMIN B-12 IJ) Inject as  directed every 30 (thirty) days.      . DULoxetine (CYMBALTA) 60 MG capsule Take 60 mg by mouth daily.      Marland Kitchen glucosamine-chondroitin 500-400 MG tablet Take 1 tablet by mouth daily.      . Lactobacillus (ACIDOPHILUS) 100 MG CAPS Take by mouth daily.      Marland Kitchen levothyroxine (SYNTHROID, LEVOTHROID) 100 MCG tablet Take by mouth daily. Patient takes 50 mg daily      . Multiple Vitamin (MULTIVITAMIN) tablet Take 1 tablet by mouth daily.      Marland Kitchen olmesartan-hydrochlorothiazide (BENICAR HCT) 20-12.5 MG per tablet Take 1 tablet by mouth daily.        . Omega-3 Fatty Acids (FISH OIL) 1000 MG CAPS Take by mouth daily.      Marland Kitchen HYDROcodone-acetaminophen (NORCO) 5-325 MG per tablet Take 1 tablet by mouth every 4 (four) hours as needed for pain.  10 tablet  0  . ondansetron (ZOFRAN) 4 MG tablet Take 1 tablet (4 mg total) by mouth every 6 (six) hours.  12 tablet  0    Results for orders placed during the hospital encounter of 06/13/12 (from the past 48 hour(s))  CBC     Status: Abnormal   Collection Time   06/13/12  6:48 PM      Component Value Range Comment   WBC 8.5  4.0 - 10.5 K/uL  RBC 3.90  3.87 - 5.11 MIL/uL    Hemoglobin 11.4 (*) 12.0 - 15.0 g/dL    HCT 34.4 (*) 36.0 - 46.0 %    MCV 88.2  78.0 - 100.0 fL    MCH 29.2  26.0 - 34.0 pg    MCHC 33.1  30.0 - 36.0 g/dL    RDW 14.2  11.5 - 15.5 %    Platelets 206  150 - 400 K/uL   COMPREHENSIVE METABOLIC PANEL     Status: Abnormal   Collection Time   06/13/12  6:48 PM      Component Value Range Comment   Sodium 140  135 - 145 mEq/L    Potassium 4.4  3.5 - 5.1 mEq/L    Chloride 100  96 - 112 mEq/L    CO2 28  19 - 32 mEq/L    Glucose, Bld 128 (*) 70 - 99 mg/dL    BUN 26 (*) 6 - 23 mg/dL    Creatinine, Ser 1.46 (*) 0.50 - 1.10 mg/dL    Calcium 10.4  8.4 - 10.5 mg/dL    Total Protein 7.3  6.0 - 8.3 g/dL    Albumin 3.9  3.5 - 5.2 g/dL    AST 16  0 - 37 U/L    ALT 11  0 - 35 U/L    Alkaline Phosphatase 63  39 - 117 U/L    Total Bilirubin 0.4  0.3 -  1.2 mg/dL    GFR calc non Af Amer 32 (*) >90 mL/min    GFR calc Af Amer 37 (*) >90 mL/min   LIPASE, BLOOD     Status: Normal   Collection Time   06/13/12  6:48 PM      Component Value Range Comment   Lipase 40  11 - 59 U/L   URINALYSIS, ROUTINE W REFLEX MICROSCOPIC     Status: Abnormal   Collection Time   06/13/12  8:48 PM      Component Value Range Comment   Color, Urine YELLOW  YELLOW    APPearance CLEAR  CLEAR    Specific Gravity, Urine 1.015  1.005 - 1.030    pH 6.0  5.0 - 8.0    Glucose, UA NEGATIVE  NEGATIVE mg/dL    Hgb urine dipstick LARGE (*) NEGATIVE    Bilirubin Urine NEGATIVE  NEGATIVE    Ketones, ur NEGATIVE  NEGATIVE mg/dL    Protein, ur NEGATIVE  NEGATIVE mg/dL    Urobilinogen, UA 0.2  0.0 - 1.0 mg/dL    Nitrite NEGATIVE  NEGATIVE    Leukocytes, UA NEGATIVE  NEGATIVE   URINE MICROSCOPIC-ADD ON     Status: Abnormal   Collection Time   06/13/12  8:48 PM      Component Value Range Comment   Squamous Epithelial / LPF FEW (*) RARE    WBC, UA 3-6  <3 WBC/hpf    RBC / HPF 21-50  <3 RBC/hpf   BASIC METABOLIC PANEL     Status: Abnormal   Collection Time   06/14/12  5:31 AM      Component Value Range Comment   Sodium 140  135 - 145 mEq/L    Potassium 4.3  3.5 - 5.1 mEq/L    Chloride 106  96 - 112 mEq/L    CO2 26  19 - 32 mEq/L    Glucose, Bld 94  70 - 99 mg/dL    BUN 23  6 - 23 mg/dL  Creatinine, Ser 1.38 (*) 0.50 - 1.10 mg/dL    Calcium 8.6  8.4 - 10.5 mg/dL    GFR calc non Af Amer 34 (*) >90 mL/min    GFR calc Af Amer 39 (*) >90 mL/min   CBC     Status: Abnormal   Collection Time   06/14/12  5:31 AM      Component Value Range Comment   WBC 4.8  4.0 - 10.5 K/uL    RBC 3.12 (*) 3.87 - 5.11 MIL/uL    Hemoglobin 9.0 (*) 12.0 - 15.0 g/dL    HCT 27.8 (*) 36.0 - 46.0 %    MCV 89.1  78.0 - 100.0 fL    MCH 28.8  26.0 - 34.0 pg    MCHC 32.4  30.0 - 36.0 g/dL    RDW 14.3  11.5 - 15.5 %    Platelets 152  150 - 400 K/uL DELTA CHECK NOTED   Ct Abdomen Pelvis W  Contrast  06/13/2012  *RADIOLOGY REPORT*  Clinical Data: Abdominal pain  CT ABDOMEN AND PELVIS WITH CONTRAST  Technique:  Multidetector CT imaging of the abdomen and pelvis was performed following the standard protocol during bolus administration of intravenous contrast.  Contrast: 113mL OMNIPAQUE IOHEXOL 300 MG/ML  SOLN  Comparison: 06/13/2012 abdominal series, 01/08/2005 CT  Findings: Right breast prosthesis.  Minimal linear opacity within the lingula is likely scarring or atelectasis.  Normal heart size. No pleural or pericardial effusion.  Low attenuation of the liver is nonspecific but can be seen with fatty infiltration.  There are a couple hypodensities within the liver, incompletely characterized however favored to reflect biliary cysts or hamartomas.  Unremarkable spleen, pancreas, and adrenal glands.    Gallstone layers dependently.  No gallbladder wall thickening or pericholecystic fluid.  There are bilateral renal hypodensities the larger of which are in keeping with cysts.  The smaller are too small to further characterize.  Tiny nonobstructing stones bilaterally.  Large staghorn calculus filling lower pole calyces and extending to the renal pelvis on the right.  No hydronephrosis or hydroureter.  There is small bowel dilatation up to 4.3 cm to the level of the right lower quadrant where there is a small bowel feces sign and abrupt transition in the region of a bowel anastomosis suture. There is interloop fluid and mesenteric fat stranding.  There is scattered atherosclerotic calcification of the aorta and its branches. No aneurysmal dilatation.  Partially decompressed bladder.  Absent uterus.  No adnexal mass.  Multilevel degenerative changes. The questionable nodule described on today's radiograph actually corresponds to a focal sclerosis within the anterior sixth rib.  IMPRESSION: Small bowel obstruction with transition in the right lower quadrant in proximity to bowel suture.  Bilateral  nonobstructing renal stones including a staghorn calculus in the lower pole on the right.  The questionable nodule described on today's radiograph actually corresponds to a focal sclerosis within the anterior sixth rib. This may correspond to prior trauma/healed rib fracture.  If there is concern for metastatic disease, bone scan could be obtained.  Original Report Authenticated By: Suanne Marker, M.D.   Dg Abd Acute W/chest  06/13/2012  *RADIOLOGY REPORT*  Clinical Data: Abdominal pain for 2 hours, history hypertension  ACUTE ABDOMEN SERIES (ABDOMEN 2 VIEW & CHEST 1 VIEW)  Comparison: Chest radiograph 08/26/2011, abdominal radiograph 10/16/2004  Findings: Upper normal heart size. Mediastinal contours and pulmonary vascularity normal. Lungs clear. No pleural effusion or pneumothorax. Prior right mastectomy and axillary node dissection. Thoracolumbar  scoliosis. Questionable developing nodular density lower left chest 12 x 8 mm. Large irregular calcific density projects over expected position of the right renal pelvis consistent with staghorn calculus 3.5 x 1.6 cm. Slightly prominent stool throughout colon. No bowel dilatation, evidence of obstruction, or free intraperitoneal air. Bones appear demineralized.  IMPRESSION: Increased stool in colon. Staghorn calculus right kidney. Question developing left lung base nodule 12 x 8 mm; follow-up CT chest recommended to exclude pulmonary nodule.  Original Report Authenticated By: Burnetta Sabin, M.D.    Review of Systems  Constitutional: Negative for fever, chills, weight loss, malaise/fatigue and diaphoresis.  HENT: Negative.   Eyes: Negative.   Respiratory: Negative.   Cardiovascular: Negative.   Gastrointestinal: Positive for heartburn, nausea, vomiting and abdominal pain. Negative for diarrhea (diffuse), constipation, blood in stool and melena.  Genitourinary: Negative.   Musculoskeletal: Negative.   Skin: Negative.   Neurological: Negative.  Negative  for weakness.  Endo/Heme/Allergies: Negative.   Psychiatric/Behavioral: Negative.     Blood pressure 91/62, pulse 65, temperature 98.1 F (36.7 C), temperature source Oral, resp. rate 20, height 5\' 4"  (1.626 m), weight 56.427 kg (124 lb 6.4 oz), SpO2 92.00%. Physical Exam  Constitutional: She is oriented to person, place, and time. She appears well-developed and well-nourished. No distress.       Elderly  HENT:  Head: Normocephalic and atraumatic.  Eyes: Conjunctivae and EOM are normal. Pupils are equal, round, and reactive to light. No scleral icterus.  Neck: Normal range of motion. Neck supple. No tracheal deviation present. No thyromegaly present.  Cardiovascular: Normal rate, regular rhythm and normal heart sounds.   Respiratory: Breath sounds normal. No respiratory distress. She has no wheezes.  GI: Soft. She exhibits distension (mild to moderate). She exhibits no mass. There is tenderness (mild diffuse, no peritoneal signs). There is no rebound and no guarding.  Lymphadenopathy:    She has no cervical adenopathy.  Neurological: She is alert and oriented to person, place, and time.  Skin: Skin is warm and dry.     Assessment/Plan Small bowel obstruction. Given patient's extensive surgical history I do feel this is likely related to adhesions within the abdomen. Indications to proceed to the operating room discussed with the patient at length. Patient does have a history of significant small bowel resection and patient states she has only approximately 4 feet of small intestine left. She did not demonstrate any signs or symptoms of short gut syndrome however given her reported history I would even be more reluctant to proceed to the operating room unless urgent indications dictated. Bowel rest will be continued. Patient will be continued n.p.o. status. Continue IV fluid hydration. Continued on DVT prophylaxis. Patient is encouraged to ambulate.  Caeley Dohrmann C 06/14/2012, 12:24  PM

## 2012-06-15 LAB — CBC
HCT: 28.7 % — ABNORMAL LOW (ref 36.0–46.0)
MCHC: 32.8 g/dL (ref 30.0–36.0)
MCV: 90.8 fL (ref 78.0–100.0)
Platelets: 148 10*3/uL — ABNORMAL LOW (ref 150–400)
RDW: 13.8 % (ref 11.5–15.5)
WBC: 4.8 10*3/uL (ref 4.0–10.5)

## 2012-06-15 LAB — BASIC METABOLIC PANEL
BUN: 18 mg/dL (ref 6–23)
Chloride: 104 mEq/L (ref 96–112)
Creatinine, Ser: 1.27 mg/dL — ABNORMAL HIGH (ref 0.50–1.10)
GFR calc Af Amer: 44 mL/min — ABNORMAL LOW (ref >90)
GFR calc non Af Amer: 38 mL/min — ABNORMAL LOW (ref >90)
Potassium: 4.4 mEq/L (ref 3.5–5.1)

## 2012-06-15 MED ORDER — SODIUM CHLORIDE 0.9 % IJ SOLN
INTRAMUSCULAR | Status: AC
  Filled 2012-06-15: qty 3

## 2012-06-15 MED ORDER — SODIUM CHLORIDE 0.9 % IJ SOLN
INTRAMUSCULAR | Status: AC
  Administered 2012-06-15: 10 mL
  Filled 2012-06-15: qty 3

## 2012-06-15 MED ORDER — DIPHENHYDRAMINE HCL 25 MG PO CAPS
25.0000 mg | ORAL_CAPSULE | Freq: Once | ORAL | Status: AC
  Administered 2012-06-15: 25 mg via ORAL
  Filled 2012-06-15: qty 1

## 2012-06-15 NOTE — Progress Notes (Signed)
Subjective: Some flatus.  No significant abdominal changes.  No BM. No nausea.   Objective: Vital signs in last 24 hours: Temp:  [97.3 F (36.3 C)-98.2 F (36.8 C)] 97.3 F (36.3 C) (07/02 0906) Pulse Rate:  [66-77] 77  (07/02 0906) Resp:  [20] 20  (07/02 0906) BP: (93-120)/(55-67) 120/67 mmHg (07/02 0906) SpO2:  [92 %-97 %] 97 % (07/02 0906) Last BM Date: 06/13/12  Intake/Output from previous day: 07/01 0701 - 07/02 0700 In: 1213 [I.V.:1203; IV Piggyback:10] Out: -  Intake/Output this shift:    General appearance: alert and no distress GI: Intermittent bowel sounds, soft, flat, mild diffuse tenderness. No peritoneal signs.  Lab Results:   [facility] 06/15/12 0953 06/14/12 0531  WBC 4.8 4.8  HGB 9.4* 9.0*  HCT 28.7* 27.8*  PLT 148* 152   BMET  Basename 06/15/12 0953 06/14/12 0531  NA 142 140  K 4.4 4.3  CL 104 106  CO2 22 26  GLUCOSE 67* 94  BUN 18 23  CREATININE 1.27* 1.38*  CALCIUM 9.4 8.6   PT/INR No results found for this basename: LABPROT:2,INR:2 in the last 72 hours ABG No results found for this basename: PHART:2,PCO2:2,PO2:2,HCO3:2 in the last 72 hours  Studies/Results: Ct Abdomen Pelvis W Contrast  06/13/2012  *RADIOLOGY REPORT*  Clinical Data: Abdominal pain  CT ABDOMEN AND PELVIS WITH CONTRAST  Technique:  Multidetector CT imaging of the abdomen and pelvis was performed following the standard protocol during bolus administration of intravenous contrast.  Contrast: 132mL OMNIPAQUE IOHEXOL 300 MG/ML  SOLN  Comparison: 06/13/2012 abdominal series, 01/08/2005 CT  Findings: Right breast prosthesis.  Minimal linear opacity within the lingula is likely scarring or atelectasis.  Normal heart size. No pleural or pericardial effusion.  Low attenuation of the liver is nonspecific but can be seen with fatty infiltration.  There are a couple hypodensities within the liver, incompletely characterized however favored to reflect biliary cysts or hamartomas.   Unremarkable spleen, pancreas, and adrenal glands.    Gallstone layers dependently.  No gallbladder wall thickening or pericholecystic fluid.  There are bilateral renal hypodensities the larger of which are in keeping with cysts.  The smaller are too small to further characterize.  Tiny nonobstructing stones bilaterally.  Large staghorn calculus filling lower pole calyces and extending to the renal pelvis on the right.  No hydronephrosis or hydroureter.  There is small bowel dilatation up to 4.3 cm to the level of the right lower quadrant where there is a small bowel feces sign and abrupt transition in the region of a bowel anastomosis suture. There is interloop fluid and mesenteric fat stranding.  There is scattered atherosclerotic calcification of the aorta and its branches. No aneurysmal dilatation.  Partially decompressed bladder.  Absent uterus.  No adnexal mass.  Multilevel degenerative changes. The questionable nodule described on today's radiograph actually corresponds to a focal sclerosis within the anterior sixth rib.  IMPRESSION: Small bowel obstruction with transition in the right lower quadrant in proximity to bowel suture.  Bilateral nonobstructing renal stones including a staghorn calculus in the lower pole on the right.  The questionable nodule described on today's radiograph actually corresponds to a focal sclerosis within the anterior sixth rib. This may correspond to prior trauma/healed rib fracture.  If there is concern for metastatic disease, bone scan could be obtained.  Original Report Authenticated By: Suanne Marker, M.D.    Anti-infectives: Anti-infectives    None      Assessment/Plan: s/p * No surgery found *  Small bowel obstruction. He appears to be resolving. Some increased flatus. We'll advance to clears. Advance diet as tolerated. No acute surgical indications. Hopefully patient will continue to progress  LOS: 2 days    Jimi Giza C 06/15/2012

## 2012-06-16 LAB — BASIC METABOLIC PANEL
Chloride: 104 mEq/L (ref 96–112)
GFR calc Af Amer: 47 mL/min — ABNORMAL LOW (ref >90)
GFR calc non Af Amer: 40 mL/min — ABNORMAL LOW (ref >90)
Potassium: 3.7 mEq/L (ref 3.5–5.1)
Sodium: 141 mEq/L (ref 135–145)

## 2012-06-16 LAB — CBC
MCHC: 32.8 g/dL (ref 30.0–36.0)
RDW: 13.5 % (ref 11.5–15.5)
WBC: 4.6 10*3/uL (ref 4.0–10.5)

## 2012-06-16 MED ORDER — ALPRAZOLAM 0.5 MG PO TABS
0.5000 mg | ORAL_TABLET | Freq: Every day | ORAL | Status: AC
  Administered 2012-06-16: 0.5 mg via ORAL
  Filled 2012-06-16: qty 1

## 2012-06-16 MED ORDER — SODIUM CHLORIDE 0.9 % IJ SOLN
INTRAMUSCULAR | Status: AC
  Administered 2012-06-16: 10 mL
  Filled 2012-06-16: qty 3

## 2012-06-16 NOTE — Progress Notes (Signed)
  Subjective: Did have a bowel movement yesterday. Stool is loose. No nausea with clears. No significant abdominal pain.  Objective: Vital signs in last 24 hours: Temp:  [97 F (36.1 C)-98.8 F (37.1 C)] 98.6 F (37 C) (07/03 [id number]) Pulse Rate:  [72-74] 72  (07/03 0611) Resp:  [20] 20  (07/03 0611) BP: (117-153)/(68-77) 117/68 mmHg (07/03 0611) SpO2:  [96 %-98 %] 98 % (07/03 0611) Last BM Date: 06/15/12  Intake/Output from previous day: 07/02 0701 - 07/03 0700 In: 2651.7 [P.O.:120; I.V.:2531.7] Out: -  Intake/Output this shift:    General appearance: alert and no distress GI: Positive bowel sounds, soft, mild tenderness. No distention. No peritoneal signs.  Lab Results:   Basename 06/16/12 0541 06/15/12 0953  WBC 4.6 4.8  HGB 8.5* 9.4*  HCT 25.9* 28.7*  PLT 132* 148*   BMET  Basename 06/16/12 0541 06/15/12 0953  NA 141 142  K 3.7 4.4  CL 104 104  CO2 26 22  GLUCOSE 84 67*  BUN 10 18  CREATININE 1.20* 1.27*  CALCIUM 9.2 9.4   PT/INR No results found for this basename: LABPROT:2,INR:2 in the last 72 hours ABG No results found for this basename: PHART:2,PCO2:2,PO2:2,HCO3:2 in the last 72 hours  Studies/Results: No results found.  Anti-infectives: Anti-infectives    None      Assessment/Plan: s/p * No surgery found * Small bowel obstruction. Obstruction appears to be resolving with conservative management. We'll continue to advance diet slowly as tolerated. Hep-Lock IV. Increase activity. Possible discharge in the next 24 hours continues to progress the  LOS: 3 days    Kimm Sider C 06/16/2012

## 2012-06-17 LAB — BASIC METABOLIC PANEL
CO2: 28 mEq/L (ref 19–32)
Chloride: 106 mEq/L (ref 96–112)
GFR calc non Af Amer: 38 mL/min — ABNORMAL LOW (ref >90)
Glucose, Bld: 100 mg/dL — ABNORMAL HIGH (ref 70–99)
Potassium: 3.4 mEq/L — ABNORMAL LOW (ref 3.5–5.1)
Sodium: 144 mEq/L (ref 135–145)

## 2012-06-17 LAB — CBC
HCT: 27.3 % — ABNORMAL LOW (ref 36.0–46.0)
Hemoglobin: 8.8 g/dL — ABNORMAL LOW (ref 12.0–15.0)
RBC: 3.05 MIL/uL — ABNORMAL LOW (ref 3.87–5.11)
WBC: 4.2 10*3/uL (ref 4.0–10.5)

## 2012-06-17 NOTE — Progress Notes (Signed)
Pt. Discharged to personal vehicle to home. Accompanied by Daughter. Discharge instructions and meds reviewed with pt. With good understanding. Currently voices no c/o pain or discomfort.

## 2012-07-06 ENCOUNTER — Encounter (INDEPENDENT_AMBULATORY_CARE_PROVIDER_SITE_OTHER): Payer: Self-pay | Admitting: Internal Medicine

## 2012-07-06 ENCOUNTER — Ambulatory Visit (INDEPENDENT_AMBULATORY_CARE_PROVIDER_SITE_OTHER): Payer: Medicare Other | Admitting: Internal Medicine

## 2012-07-06 VITALS — BP 102/68 | HR 70 | Temp 98.4°F | Resp 18 | Ht 64.0 in | Wt 122.6 lb

## 2012-07-06 DIAGNOSIS — R197 Diarrhea, unspecified: Secondary | ICD-10-CM | POA: Diagnosis not present

## 2012-07-06 DIAGNOSIS — N898 Other specified noninflammatory disorders of vagina: Secondary | ICD-10-CM | POA: Insufficient documentation

## 2012-07-06 DIAGNOSIS — D649 Anemia, unspecified: Secondary | ICD-10-CM | POA: Diagnosis not present

## 2012-07-06 DIAGNOSIS — K909 Intestinal malabsorption, unspecified: Secondary | ICD-10-CM | POA: Diagnosis not present

## 2012-07-06 DIAGNOSIS — K529 Noninfective gastroenteritis and colitis, unspecified: Secondary | ICD-10-CM

## 2012-07-06 LAB — CBC WITH DIFFERENTIAL/PLATELET
Basophils Absolute: 0 10*3/uL (ref 0.0–0.1)
HCT: 31.7 % — ABNORMAL LOW (ref 36.0–46.0)
Hemoglobin: 10.6 g/dL — ABNORMAL LOW (ref 12.0–15.0)
Lymphocytes Relative: 29 % (ref 12–46)
Monocytes Absolute: 0.6 10*3/uL (ref 0.1–1.0)
Monocytes Relative: 8 % (ref 3–12)
Neutro Abs: 4.2 10*3/uL (ref 1.7–7.7)
RBC: 3.72 MIL/uL — ABNORMAL LOW (ref 3.87–5.11)
WBC: 7.4 10*3/uL (ref 4.0–10.5)

## 2012-07-06 LAB — IRON AND TIBC
TIBC: 370 ug/dL (ref 250–470)
UIBC: 311 ug/dL (ref 125–400)

## 2012-07-06 LAB — FERRITIN: Ferritin: 132 ng/mL (ref 10–291)

## 2012-07-06 LAB — FOLATE: Folate: >20 ng/mL

## 2012-07-06 MED ORDER — METRONIDAZOLE 250 MG PO TABS: 250.0000 mg | ORAL_TABLET | Freq: Three times a day (TID) | ORAL | Status: AC

## 2012-07-06 NOTE — Patient Instructions (Addendum)
Physician will contact you with results of blood work. Can take Imodium OTC 2 mg once or twice daily as needed for diarrhea.

## 2012-07-06 NOTE — Progress Notes (Signed)
Presenting complaint;  Weakness and diarrhea.   Subjective:  Patient is 78 year old Caucasian female who presents with weakness and and is sleeping more during the daytime. She was admitted to [facility] on 06/13/2012 for small bowel obstruction and discharged on July 4. She is not having any abdominal pain nausea or vomiting. Her appetite is back to normal. She remains with diarrhea but rarely has more than 2-3 stools per day. She denies melena or rectal bleeding. He does not take any NSAIDs. Her weight is down by 2 pounds since her last visit of February 2013. Last time she took metronidazole for flare up of her diarrhea was over 2 months ago. She has short bowel as well as bacterial overgrowth and has required periodic antibiotic therapy. Today she's also complaining of yellowish vaginal discharge with slight odor.  Current Medications: Current Outpatient Prescriptions  Medication Sig Dispense Refill  . alendronate (FOSAMAX) 70 MG tablet Take 70 mg by mouth every 7 (seven) days. Take with a full glass of water on an empty stomach. Saturday      . ALPRAZolam (XANAX) 1 MG tablet Take 0.5 mg by mouth as needed. Patient states that when she takes it she takes 1/2 of a 1 mg      . Cholecalciferol (VITAMIN D PO) Take 1,200 mg by mouth daily.      . Cyanocobalamin (VITAMIN B-12 IJ) Inject as directed every 30 (thirty) days.      . DULoxetine (CYMBALTA) 60 MG capsule Take 60 mg by mouth daily.      Marland Kitchen glucosamine-chondroitin 500-400 MG tablet Take 1 tablet by mouth daily.      . Lactobacillus (ACIDOPHILUS) 100 MG CAPS Take by mouth daily.      Marland Kitchen levothyroxine (SYNTHROID, LEVOTHROID) 100 MCG tablet Take by mouth daily. Patient takes 50 mg daily      . Multiple Vitamin (MULTIVITAMIN) tablet Take 1 tablet by mouth daily.      Marland Kitchen olmesartan-hydrochlorothiazide (BENICAR HCT) 20-12.5 MG per tablet Take 1 tablet by mouth daily.        . Omega-3 Fatty Acids (FISH OIL) 1000 MG CAPS Take by mouth  daily.         Objective: Blood pressure 102/68, pulse 70, temperature 98.4 F (36.9 C), temperature source Oral, resp. rate 18, height 5\' 4"  (1.626 m), weight 122 lb 9.6 oz (55.611 kg). Patient is alert and in no acute distress. Conjunctiva is pink. Sclera is nonicteric Oropharyngeal mucosa is normal. No neck masses or thyromegaly noted. Cardiac exam with regular rhythm normal S1 and S2. She has grade 2/6 short systolic murmur at LLSB. Lungs are clear to auscultation. Abdomen is full with multiple scars. Bowel sounds normal. Percussion note somewhat tympanitic but it is very soft and nontender without organomegaly or masses. Rectal examination reveals brown guaiac-negative stool. No LE edema or clubbing noted. Vaginal examination was performed by Ms. Allen Norris NP.  Labs/studies Results: Lab data from recent hospitalization reviewed. Hemoglobin on admission was 11.4 and on discharge was 8.8. Serum creatinine on admission was 1.46 and on discharge was 1.27.   Assessment:  #1. Weakness most likely secondary to anemia. Her hemoglobin was 10.9 when I last saw in February 2013. She could have bled from her GI tract with recent hospitalization but today her stool is guaiac-negative. She could also have folate deficiency given history of short gut syndrome and bacterial overgrowth. She has been on parenteral B12 chronically. #2. Vaginal discharge. Possible bacterial vaginosis. #3. Chronic diarrhea.  Plan:  Patient will go to lab for CBC with differential, serum iron TIBC ferritin and folate level. Metronidazole 250 mg by mouth 3 times a day for one week. If vaginal discharge persist she will need to see her gynecologist. Can take Imodium OTC 2 mg twice a day as needed. Further recommendations to follow depending on results of blood work.

## 2012-07-09 ENCOUNTER — Telehealth (INDEPENDENT_AMBULATORY_CARE_PROVIDER_SITE_OTHER): Payer: Self-pay | Admitting: *Deleted

## 2012-07-09 DIAGNOSIS — D649 Anemia, unspecified: Secondary | ICD-10-CM

## 2012-07-09 NOTE — Telephone Encounter (Signed)
Per Dr.Rehman the patient will need to have H/H in 2 months

## 2012-07-14 NOTE — Discharge Summary (Signed)
Physician Discharge Summary  Patient ID: Jane Andrews MRN: [id number] DOB/AGE: 08/25/1925 78 y.o.  Admit date: 06/13/2012 Discharge date: 06/17/2012  Admission Diagnoses: Small bowel obstruction  Discharge Diagnoses: The same Active Problems:  * No active hospital problems. *    Discharged Condition: stable  Hospital Course: Patient presented to [facility] emergency department with diffuse colicky abdominal pain nausea and vomiting. Workup and evaluation was consistent for his suspected small bowel obstruction. Patient was admitted to the hospital for continued management. IV fluid hydration and bowel rest were undertaken. Patient did again have return of bowel function on hospital day #2. Her symptomatology continued to improve. Upon having a bowel movement she was advanced on her diet. Once tolerating regular diet patient was ready for discharge to home.  Consults: None  Significant Diagnostic Studies: labs: Daily labs and radiology: CT scan: Abdomen and pelvis  Treatments: IV hydration and bowel rest  Discharge Exam: Blood pressure 109/65, pulse 67, temperature 97.9 F (36.6 C), temperature source Oral, resp. rate 20, height 5\' 4"  (1.626 m), weight 56.427 kg (124 lb 6.4 oz), SpO2 96.00%. General appearance: alert and no distress Resp: clear to auscultation bilaterally Cardio: regular rate and rhythm GI: soft, non-tender; bowel sounds normal; no masses,  no organomegaly  Disposition: 01-Home or Self Care  Discharge Orders    Future Appointments: Provider: Department: Dept Phone: Center:   10/11/2012 9:30 AM Rogene Houston, MD [facility] [phone] None     Future Orders Please Complete By Expires   Diet - low sodium heart healthy      Increase activity slowly      Discharge instructions      Comments:   Increase activity as tolerated.  Keep diet low residual for next 1-2 weeks.     Medication List  As of 07/14/2012  9:51 AM   TAKE these medications           Acidophilus 100 MG Caps   Take by mouth daily.      alendronate 70 MG tablet   Commonly known as: FOSAMAX   Take 70 mg by mouth every 7 (seven) days. Take with a full glass of water on an empty stomach. Saturday      ALPRAZolam 1 MG tablet   Commonly known as: XANAX   Take 0.5 mg by mouth as needed. Patient states that when she takes it she takes 1/2 of a 1 mg      DULoxetine 60 MG capsule   Commonly known as: CYMBALTA   Take 60 mg by mouth daily.      Fish Oil 1000 MG Caps   Take by mouth daily.      glucosamine-chondroitin 500-400 MG tablet   Take 1 tablet by mouth daily.      HYDROcodone-acetaminophen 5-325 MG per tablet   Commonly known as: NORCO/VICODIN   Take 1 tablet by mouth every 4 (four) hours as needed for pain.      levothyroxine 100 MCG tablet   Commonly known as: SYNTHROID, LEVOTHROID   Take by mouth daily. Patient takes 50 mg daily      multivitamin tablet   Take 1 tablet by mouth daily.      olmesartan-hydrochlorothiazide 20-12.5 MG per tablet   Commonly known as: BENICAR HCT   Take 1 tablet by mouth daily.      ondansetron 4 MG tablet   Commonly known as: ZOFRAN   Take 1 tablet (4 mg total) by mouth every 6 (six)  hours.      VITAMIN B-12 IJ   Inject as directed every 30 (thirty) days.      VITAMIN D PO   Take 1,200 mg by mouth daily.           Follow-up Information    Follow up with Donato Heinz, MD. (As needed)    Contact information:   [address] [place] [phone]          Signed: Donato Heinz 07/14/2012, 9:51 AM

## 2012-07-19 DIAGNOSIS — I10 Essential (primary) hypertension: Secondary | ICD-10-CM | POA: Diagnosis not present

## 2012-07-19 DIAGNOSIS — IMO0002 Reserved for concepts with insufficient information to code with codable children: Secondary | ICD-10-CM | POA: Diagnosis not present

## 2012-07-19 DIAGNOSIS — N189 Chronic kidney disease, unspecified: Secondary | ICD-10-CM | POA: Diagnosis not present

## 2012-07-19 DIAGNOSIS — M1990 Unspecified osteoarthritis, unspecified site: Secondary | ICD-10-CM | POA: Diagnosis not present

## 2012-07-30 DIAGNOSIS — I10 Essential (primary) hypertension: Secondary | ICD-10-CM | POA: Diagnosis not present

## 2012-07-30 DIAGNOSIS — N951 Menopausal and female climacteric states: Secondary | ICD-10-CM | POA: Diagnosis not present

## 2012-07-30 DIAGNOSIS — M810 Age-related osteoporosis without current pathological fracture: Secondary | ICD-10-CM | POA: Diagnosis not present

## 2012-07-30 DIAGNOSIS — E785 Hyperlipidemia, unspecified: Secondary | ICD-10-CM | POA: Diagnosis not present

## 2012-08-12 ENCOUNTER — Encounter (INDEPENDENT_AMBULATORY_CARE_PROVIDER_SITE_OTHER): Payer: Self-pay | Admitting: *Deleted

## 2012-08-12 ENCOUNTER — Other Ambulatory Visit (INDEPENDENT_AMBULATORY_CARE_PROVIDER_SITE_OTHER): Payer: Self-pay | Admitting: *Deleted

## 2012-08-12 DIAGNOSIS — D649 Anemia, unspecified: Secondary | ICD-10-CM

## 2012-08-20 ENCOUNTER — Encounter (INDEPENDENT_AMBULATORY_CARE_PROVIDER_SITE_OTHER): Payer: Self-pay

## 2012-08-26 ENCOUNTER — Ambulatory Visit (INDEPENDENT_AMBULATORY_CARE_PROVIDER_SITE_OTHER): Payer: Medicare Other | Admitting: Orthopedic Surgery

## 2012-08-26 ENCOUNTER — Ambulatory Visit (INDEPENDENT_AMBULATORY_CARE_PROVIDER_SITE_OTHER): Payer: Medicare Other

## 2012-08-26 ENCOUNTER — Encounter: Payer: Self-pay | Admitting: Orthopedic Surgery

## 2012-08-26 VITALS — BP 98/62 | Ht 64.0 in | Wt 122.0 lb

## 2012-08-26 DIAGNOSIS — M25519 Pain in unspecified shoulder: Secondary | ICD-10-CM

## 2012-08-26 DIAGNOSIS — M545 Low back pain: Secondary | ICD-10-CM

## 2012-08-26 DIAGNOSIS — M25512 Pain in left shoulder: Secondary | ICD-10-CM

## 2012-08-26 DIAGNOSIS — M67919 Unspecified disorder of synovium and tendon, unspecified shoulder: Secondary | ICD-10-CM | POA: Diagnosis not present

## 2012-08-26 DIAGNOSIS — M75102 Unspecified rotator cuff tear or rupture of left shoulder, not specified as traumatic: Secondary | ICD-10-CM | POA: Insufficient documentation

## 2012-08-26 NOTE — Patient Instructions (Addendum)
Call hospital to arrange PT for your back   You have received a steroid shot. 15% of patients experience increased pain at the injection site with in the next 24 hours. This is best treated with ice and tylenol extra strength 2 tabs every 8 hours. If you are still having pain please call the office.   Impingement Syndrome, Rotator Cuff, Bursitis with Rehab Impingement syndrome is a condition that involves inflammation of the tendons of the rotator cuff and the subacromial bursa, that causes pain in the shoulder. The rotator cuff consists of four tendons and muscles that control much of the shoulder and upper arm function. The subacromial bursa is a fluid filled sac that helps reduce friction between the rotator cuff and one of the bones of the shoulder (acromion). Impingement syndrome is usually an overuse injury that causes swelling of the bursa (bursitis), swelling of the tendon (tendonitis), and/or a tear of the tendon (strain). Strains are classified into three categories. Grade 1 strains cause pain, but the tendon is not lengthened. Grade 2 strains include a lengthened ligament, due to the ligament being stretched or partially ruptured. With grade 2 strains there is still function, although the function may be decreased. Grade 3 strains include a complete tear of the tendon or muscle, and function is usually impaired. SYMPTOMS    Pain around the shoulder, often at the outer portion of the upper arm.   Pain that gets worse with shoulder function, especially when reaching overhead or lifting.   Sometimes, aching when not using the arm.   Pain that wakes you up at night.   Sometimes, tenderness, swelling, warmth, or redness over the affected area.   Loss of strength.   Limited motion of the shoulder, especially reaching behind the back (to the back pocket or to unhook bra) or across your body.   Crackling sound (crepitation) when moving the arm.   Biceps tendon pain and inflammation (in  the front of the shoulder). Worse when bending the elbow or lifting.  CAUSES   Impingement syndrome is often an overuse injury, in which chronic (repetitive) motions cause the tendons or bursa to become inflamed. A strain occurs when a force is paced on the tendon or muscle that is greater than it can withstand. Common mechanisms of injury include: Stress from sudden increase in duration, frequency, or intensity of training.  Direct hit (trauma) to the shoulder.   Aging, erosion of the tendon with normal use.   Bony bump on shoulder (acromial spur).  RISK INCREASES WITH:  Contact sports (football, wrestling, boxing).   Throwing sports (baseball, tennis, volleyball).   Weightlifting and bodybuilding.   Heavy labor.   Previous injury to the rotator cuff, including impingement.   Poor shoulder strength and flexibility.   Failure to warm up properly before activity.   Inadequate protective equipment.   Old age.   Bony bump on shoulder (acromial spur).  PREVENTION    Warm up and stretch properly before activity.   Allow for adequate recovery between workouts.   Maintain physical fitness:   Strength, flexibility, and endurance.   Cardiovascular fitness.   Learn and use proper exercise technique.  PROGNOSIS   If treated properly, impingement syndrome usually goes away within 6 weeks. Sometimes surgery is required.   RELATED COMPLICATIONS    Longer healing time if not properly treated, or if not given enough time to heal.   Recurring symptoms, that result in a chronic condition.   Shoulder stiffness, frozen shoulder,  or loss of motion.   Rotator cuff tendon tear.   Recurring symptoms, especially if activity is resumed too soon, with overuse, with a direct blow, or when using poor technique.  TREATMENT   Treatment first involves the use of ice and medicine, to reduce pain and inflammation. The use of strengthening and stretching exercises may help reduce pain with  activity. These exercises may be performed at home or with a therapist. If non-surgical treatment is unsuccessful after more than 6 months, surgery may be advised. After surgery and rehabilitation, activity is usually possible in 3 months.   MEDICATION  If pain medicine is needed, nonsteroidal anti-inflammatory medicines (aspirin and ibuprofen), or other minor pain relievers (acetaminophen), are often advised.   Do not take pain medicine for 7 days before surgery.   Prescription pain relievers may be given, if your caregiver thinks they are needed. Use only as directed and only as much as you need.   Corticosteroid injections may be given by your caregiver. These injections should be reserved for the most serious cases, because they may only be given a certain number of times.  HEAT AND COLD  Cold treatment (icing) should be applied for 10 to 15 minutes every 2 to 3 hours for inflammation and pain, and immediately after activity that aggravates your symptoms. Use ice packs or an ice massage.   Heat treatment may be used before performing stretching and strengthening activities prescribed by your caregiver, physical therapist, or athletic trainer. Use a heat pack or a warm water soak.  SEEK MEDICAL CARE IF:    Symptoms get worse or do not improve in 4 to 6 weeks, despite treatment.   New, unexplained symptoms develop. (Drugs used in treatment may produce side effects.)  EXERCISES: Do exercises 3 sets of 10  RANGE OF MOTION (ROM) AND STRETCHING EXERCISES - Impingement Syndrome (Rotator Cuff  Tendinitis, Bursitis) These exercises may help you when beginning to rehabilitate your injury. Your symptoms may go away with or without further involvement from your physician, physical therapist or athletic trainer. While completing these exercises, remember:    Restoring tissue flexibility helps normal motion to return to the joints. This allows healthier, less painful movement and activity.   An  effective stretch should be held for at least 30 seconds.   A stretch should never be painful. You should only feel a gentle lengthening or release in the stretched tissue.  STRETCH - Flexion, Standing  Stand with good posture. With an underhand grip on your right / left hand, and an overhand grip on the opposite hand, grasp a broomstick or cane so that your hands are a little more than shoulder width apart.   Keeping your right / left elbow straight and shoulder muscles relaxed, push the stick with your opposite hand, to raise your right / left arm in front of your body and then overhead. Raise your arm until you feel a stretch in your right / left shoulder, but before you have increased shoulder pain.   Try to avoid shrugging your right / left shoulder as your arm rises, by keeping your shoulder blade tucked down and toward your mid-back spine. Hold for __________ seconds.   Slowly return to the starting position.  Repeat __________ times. Complete this exercise __________ times per day. STRETCH - Abduction, Supine  Lie on your back. With an underhand grip on your right / left hand and an overhand grip on the opposite hand, grasp a broomstick or cane so that  your hands are a little more than shoulder width apart.   Keeping your right / left elbow straight and your shoulder muscles relaxed, push the stick with your opposite hand, to raise your right / left arm out to the side of your body and then overhead. Raise your arm until you feel a stretch in your right / left shoulder, but before you have increased shoulder pain.   Try to avoid shrugging your right / left shoulder as your arm rises, by keeping your shoulder blade tucked down and toward your mid-back spine. Hold for __________ seconds.   Slowly return to the starting position.  Repeat __________ times. Complete this exercise __________ times per day. ROM - Flexion, Active-Assisted  Lie on your back. You may bend your knees for  comfort.   Grasp a broomstick or cane so your hands are about shoulder width apart. Your right / left hand should grip the end of the stick, so that your hand is positioned "thumbs-up," as if you were about to shake hands.   Using your healthy arm to lead, raise your right / left arm overhead, until you feel a gentle stretch in your shoulder. Hold for __________ seconds.   Use the stick to assist in returning your right / left arm to its starting position.  Repeat __________ times. Complete this exercise __________ times per day.   ROM - Internal Rotation, Supine   Lie on your back on a firm surface. Place your right / left elbow about 60 degrees away from your side. Elevate your elbow with a folded towel, so that the elbow and shoulder are the same height.   Using a broomstick or cane and your strong arm, pull your right / left hand toward your body until you feel a gentle stretch, but no increase in your shoulder pain. Keep your shoulder and elbow in place throughout the exercise.   Hold for __________ seconds. Slowly return to the starting position.  Repeat __________ times. Complete this exercise __________ times per day. STRETCH - Internal Rotation  Place your right / left hand behind your back, palm up.   Throw a towel or belt over your opposite shoulder. Grasp the towel with your right / left hand.   While keeping an upright posture, gently pull up on the towel, until you feel a stretch in the front of your right / left shoulder.   Avoid shrugging your right / left shoulder as your arm rises, by keeping your shoulder blade tucked down and toward your mid-back spine.   Hold for __________ seconds. Release the stretch, by lowering your healthy hand.  Repeat __________ times. Complete this exercise __________ times per day. ROM - Internal Rotation   Using an underhand grip, grasp a stick behind your back with both hands.   While standing upright with good posture, slide the stick  up your back until you feel a mild stretch in the front of your shoulder.   Hold for __________ seconds. Slowly return to your starting position.  Repeat __________ times. Complete this exercise __________ times per day.   STRETCH - Posterior Shoulder Capsule   Stand or sit with good posture. Grasp your right / left elbow and draw it across your chest, keeping it at the same height as your shoulder.   Pull your elbow, so your upper arm comes in closer to your chest. Pull until you feel a gentle stretch in the back of your shoulder.   Hold for __________ seconds.  Repeat __________ times. Complete this exercise __________ times per day. STRENGTHENING EXERCISES - Impingement Syndrome (Rotator Cuff Tendinitis, Bursitis) These exercises may help you when beginning to rehabilitate your injury. They may resolve your symptoms with or without further involvement from your physician, physical therapist or athletic trainer. While completing these exercises, remember:  Muscles can gain both the endurance and the strength needed for everyday activities through controlled exercises.   Complete these exercises as instructed by your physician, physical therapist or athletic trainer. Increase the resistance and repetitions only as guided.   You may experience muscle soreness or fatigue, but the pain or discomfort you are trying to eliminate should never worsen during these exercises. If this pain does get worse, stop and make sure you are following the directions exactly. If the pain is still present after adjustments, discontinue the exercise until you can discuss the trouble with your clinician.   During your recovery, avoid activity or exercises which involve actions that place your injured hand or elbow above your head or behind your back or head. These positions stress the tissues which you are trying to heal.  STRENGTH - Scapular Depression and Adduction   With good posture, sit on a firm chair.  Support your arms in front of you, with pillows, arm rests, or on a table top. Have your elbows in line with the sides of your body.   Gently draw your shoulder blades down and toward your mid-back spine. Gradually increase the tension, without tensing the muscles along the top of your shoulders and the back of your neck.   Hold for __________ seconds. Slowly release the tension and relax your muscles completely before starting the next repetition.   After you have practiced this exercise, remove the arm support and complete the exercise in standing as well as sitting position.  Repeat __________ times. Complete this exercise __________ times per day.   STRENGTH - Shoulder Abductors, Isometric  With good posture, stand or sit about 4-6 inches from a wall, with your right / left side facing the wall.   Bend your right / left elbow. Gently press your right / left elbow into the wall. Increase the pressure gradually, until you are pressing as hard as you can, without shrugging your shoulder or increasing any shoulder discomfort.   Hold for __________ seconds.   Release the tension slowly. Relax your shoulder muscles completely before you begin the next repetition.  Repeat __________ times. Complete this exercise __________ times per day.   STRENGTH - External Rotators, Isometric  Keep your right / left elbow at your side and bend it 90 degrees.   Step into a door frame so that the outside of your right / left wrist can press against the door frame without your upper arm leaving your side.   Gently press your right / left wrist into the door frame, as if you were trying to swing the back of your hand away from your stomach. Gradually increase the tension, until you are pressing as hard as you can, without shrugging your shoulder or increasing any shoulder discomfort.   Hold for __________ seconds.   Release the tension slowly. Relax your shoulder muscles completely before you begin the next  repetition.  Repeat __________ times. Complete this exercise __________ times per day.   STRENGTH - Supraspinatus   Stand or sit with good posture. Grasp a __________ weight, or an exercise band or tubing, so that your hand is "thumbs-up," like you are shaking hands.  Slowly lift your right / left arm in a "V" away from your thigh, diagonally into the space between your side and straight ahead. Lift your hand to shoulder height or as far as you can, without increasing any shoulder pain. At first, many people do not lift their hands above shoulder height.   Avoid shrugging your right / left shoulder as your arm rises, by keeping your shoulder blade tucked down and toward your mid-back spine.   Hold for __________ seconds. Control the descent of your hand, as you slowly return to your starting position.  Repeat __________ times. Complete this exercise __________ times per day.   STRENGTH - External Rotators  Secure a rubber exercise band or tubing to a fixed object (table, pole) so that it is at the same height as your right / left elbow when you are standing or sitting on a firm surface.   Stand or sit so that the secured exercise band is at your uninjured side.   Bend your right / left elbow 90 degrees. Place a folded towel or small pillow under your right / left arm, so that your elbow is a few inches away from your side.   Keeping the tension on the exercise band, pull it away from your body, as if pivoting on your elbow. Be sure to keep your body steady, so that the movement is coming only from your rotating shoulder.   Hold for __________ seconds. Release the tension in a controlled manner, as you return to the starting position.  Repeat __________ times. Complete this exercise __________ times per day.   STRENGTH - Internal Rotators   Secure a rubber exercise band or tubing to a fixed object (table, pole) so that it is at the same height as your right / left elbow when you are  standing or sitting on a firm surface.   Stand or sit so that the secured exercise band is at your right / left side.   Bend your elbow 90 degrees. Place a folded towel or small pillow under your right / left arm so that your elbow is a few inches away from your side.   Keeping the tension on the exercise band, pull it across your body, toward your stomach. Be sure to keep your body steady, so that the movement is coming only from your rotating shoulder.   Hold for __________ seconds. Release the tension in a controlled manner, as you return to the starting position.  Repeat __________ times. Complete this exercise __________ times per day.   STRENGTH - Scapular Protractors, Standing   Stand arms length away from a wall. Place your hands on the wall, keeping your elbows straight.   Begin by dropping your shoulder blades down and toward your mid-back spine.   To strengthen your protractors, keep your shoulder blades down, but slide them forward on your rib cage. It will feel as if you are lifting the back of your rib cage away from the wall. This is a subtle motion and can be challenging to complete. Ask your caregiver for further instruction, if you are not sure you are doing the exercise correctly.   Hold for __________ seconds. Slowly return to the starting position, resting the muscles completely before starting the next repetition.  Repeat __________ times. Complete this exercise __________ times per day. STRENGTH - Scapular Protractors, Supine  Lie on your back on a firm surface. Extend your right / left arm straight into the air while holding a  __________ weight in your hand.   Keeping your head and back in place, lift your shoulder off the floor.   Hold for __________ seconds. Slowly return to the starting position, and allow your muscles to relax completely before starting the next repetition.  Repeat __________ times. Complete this exercise __________ times per day. STRENGTH -  Scapular Protractors, Quadruped  Get onto your hands and knees, with your shoulders directly over your hands (or as close as you can be, comfortably).   Keeping your elbows locked, lift the back of your rib cage up into your shoulder blades, so your mid-back rounds out. Keep your neck muscles relaxed.   Hold this position for __________ seconds. Slowly return to the starting position and allow your muscles to relax completely before starting the next repetition.  Repeat __________ times. Complete this exercise __________ times per day.   STRENGTH - Scapular Retractors  Secure a rubber exercise band or tubing to a fixed object (table, pole), so that it is at the height of your shoulders when you are either standing, or sitting on a firm armless chair.   With a palm down grip, grasp an end of the band in each hand. Straighten your elbows and lift your hands straight in front of you, at shoulder height. Step back, away from the secured end of the band, until it becomes tense.   Squeezing your shoulder blades together, draw your elbows back toward your sides, as you bend them. Keep your upper arms lifted away from your body throughout the exercise.   Hold for __________ seconds. Slowly ease the tension on the band, as you reverse the directions and return to the starting position.  Repeat __________ times. Complete this exercise __________ times per day. STRENGTH - Shoulder Extensors   Secure a rubber exercise band or tubing to a fixed object (table, pole) so that it is at the height of your shoulders when you are either standing, or sitting on a firm armless chair.   With a thumbs-up grip, grasp an end of the band in each hand. Straighten your elbows and lift your hands straight in front of you, at shoulder height. Step back, away from the secured end of the band, until it becomes tense.   Squeezing your shoulder blades together, pull your hands down to the sides of your thighs. Do not allow  your hands to go behind you.   Hold for __________ seconds. Slowly ease the tension on the band, as you reverse the directions and return to the starting position.  Repeat __________ times. Complete this exercise __________ times per day.   STRENGTH - Scapular Retractors and External Rotators   Secure a rubber exercise band or tubing to a fixed object (table, pole) so that it is at the height as your shoulders, when you are either standing, or sitting on a firm armless chair.   With a palm down grip, grasp an end of the band in each hand. Bend your elbows 90 degrees and lift your elbows to shoulder height, at your sides. Step back, away from the secured end of the band, until it becomes tense.   Squeezing your shoulder blades together, rotate your shoulders so that your upper arms and elbows remain stationary, but your fists travel upward to head height.   Hold for __________ seconds. Slowly ease the tension on the band, as you reverse the directions and return to the starting position.  Repeat __________ times. Complete this exercise __________ times per day.  STRENGTH - Scapular Retractors and External Rotators, Rowing   Secure a rubber exercise band or tubing to a fixed object (table, pole) so that it is at the height of your shoulders, when you are either standing, or sitting on a firm armless chair.   With a palm down grip, grasp an end of the band in each hand. Straighten your elbows and lift your hands straight in front of you, at shoulder height. Step back, away from the secured end of the band, until it becomes tense.   Step 1: Squeeze your shoulder blades together. Bending your elbows, draw your hands to your chest, as if you are rowing a boat. At the end of this motion, your hands and elbow should be at shoulder height and your elbows should be out to your sides.   Step 2: Rotate your shoulders, to raise your hands above your head. Your forearms should be vertical and your upper  arms should be horizontal.   Hold for __________ seconds. Slowly ease the tension on the band, as you reverse the directions and return to the starting position.  Repeat __________ times. Complete this exercise __________ times per day.   STRENGTH - Scapular Depressors  Find a sturdy chair without wheels, such as a dining room chair.   Keeping your feet on the floor, and your hands on the chair arms, lift your bottom up from the seat, and lock your elbows.   Keeping your elbows straight, allow gravity to pull your body weight down. Your shoulders will rise toward your ears.   Raise your body against gravity by drawing your shoulder blades down your back, shortening the distance between your shoulders and ears. Although your feet should always maintain contact with the floor, your feet should progressively support less body weight, as you get stronger.   Hold for __________ seconds. In a controlled and slow manner, lower your body weight to begin the next repetition.  Repeat __________ times. Complete this exercise __________ times per day.   Document Released: 12/01/2005 Document Revised: 11/20/2011 Document Reviewed: 03/15/2009 [facility] Patient Information 2012 [place].   Back Pain, Adult Low back pain is very common. About 1 in 5 people have back pain. The cause of low back pain is rarely dangerous. The pain often gets better over time. About half of people with a sudden onset of back pain feel better in just 2 weeks. About 8 in 10 people feel better by 6 weeks.   CAUSES Some common causes of back pain include:  Strain of the muscles or ligaments supporting the spine.   Wear and tear (degeneration) of the spinal discs.   Arthritis.   Direct injury to the back.  DIAGNOSIS Most of the time, the direct cause of low back pain is not known. However, back pain can be treated effectively even when the exact cause of the pain is unknown. Answering your caregiver's questions about  your overall health and symptoms is one of the most accurate ways to make sure the cause of your pain is not dangerous. If your caregiver needs more information, he or she may order lab work or imaging tests (X-rays or MRIs). However, even if imaging tests show changes in your back, this usually does not require surgery. HOME CARE INSTRUCTIONS For many people, back pain returns. Since low back pain is rarely dangerous, it is often a condition that people can learn to manage on their own.    Remain active. It is stressful on the  back to sit or stand in one place. Do not sit, drive, or stand in one place for more than 30 minutes at a time. Take short walks on level surfaces as soon as pain allows. Try to increase the length of time you walk each day.   Do not stay in bed. Resting more than 1 or 2 days can delay your recovery.   Do not avoid exercise or work. Your body is made to move. It is not dangerous to be active, even though your back may hurt. Your back will likely heal faster if you return to being active before your pain is gone.   Pay attention to your body when you  bend and lift. Many people have less discomfort when lifting if they bend their knees, keep the load close to their bodies, and avoid twisting. Often, the most comfortable positions are those that put less stress on your recovering back.   Find a comfortable position to sleep. Use a firm mattress and lie on your side with your knees slightly bent. If you lie on your back, put a pillow under your knees.   Only take over-the-counter or prescription medicines as directed by your caregiver. Over-the-counter medicines to reduce pain and inflammation are often the most helpful. Your caregiver may prescribe muscle relaxant drugs. These medicines help dull your pain so you can more quickly return to your normal activities and healthy exercise.   Put ice on the injured area.   Put ice in a plastic bag.   Place a towel between your skin  and the bag.   Leave the ice on for 15 to 20 minutes, 3 to 4 times a day for the first 2 to 3 days. After that, ice and heat may be alternated to reduce pain and spasms.   Ask your caregiver about trying back exercises and gentle massage. This may be of some benefit.   Avoid feeling anxious or stressed. Stress increases muscle tension and can worsen back pain. It is important to recognize when you are anxious or stressed and learn ways to manage it. Exercise is a great option.  SEEK MEDICAL CARE IF:  You have pain that is not relieved with rest or medicine.   You have pain that does not improve in 1 week.   You have new symptoms.   You are generally not feeling well.  SEEK IMMEDIATE MEDICAL CARE IF:    You have pain that radiates from your back into your legs.   You develop new bowel or bladder control problems.   You have unusual weakness or numbness in your arms or legs.   You develop nausea or vomiting.   You develop abdominal pain.   You feel faint.  Document Released: 12/01/2005 Document Revised: 11/20/2011 Document Reviewed: 04/21/2011 [facility] Patient Information 2012 [employer].

## 2012-08-26 NOTE — Progress Notes (Signed)
Subjective:    Patient ID: Jane Andrews, female    DOB: 25-Jan-1925, 78 y.o.   MRN: [id number]  Shoulder Pain  The pain is present in the left shoulder. The current episode started 1 to 4 weeks ago. There has been no history of extremity trauma. The problem occurs intermittently. The problem has been unchanged. The quality of the pain is described as aching and sharp (Stabbing). The pain is at a severity of 8/10. Associated symptoms include a limited range of motion and stiffness. Pertinent negatives include no fever, itching, joint locking, joint swelling, numbness or tingling. Associated symptoms comments: Catching.   She is asking whether or not she should see Dr. Carloyn Manner because of lower back pain   Review of Systems  Constitutional: Negative for fever.  Musculoskeletal: Positive for stiffness.  Skin: Negative for itching.  Neurological: Negative for tingling and numbness.   she does complain of heart murmur, heartburn, diarrhea, unexpected weight loss, easy bruising. She also complains of lower back pain without leg pain. She denies bowel or bladder dysfunction     Objective:   Physical Exam BP 98/62  Ht 5\' 4"  (1.626 m)  Wt 122 lb (55.339 kg)  BMI 20.94 kg/m2 Her frame is extremely small she is extremely thin she is oriented x3 her mood and affect are normal her gait and station are normal. She is normal back flexion some mild lower back tenderness she has normal muscle tone and no instability in the lower back and the skin is normal  Right Shoulder Exam   Tenderness  The patient is experiencing no tenderness.    Range of Motion  The patient has normal right shoulder ROM. Active Abduction: normal  Passive Abduction: normal  Extension: normal  Forward Flexion: normal  External Rotation: normal  Internal Rotation 0 degrees: normal  Internal Rotation 90 degrees: normal   Muscle Strength  The patient has normal right shoulder strength. Abduction: 5/5  Internal  Rotation: 5/5  External Rotation: 5/5  Supraspinatus: 5/5  Subscapularis: 5/5  Biceps: 5/5   Tests  Apprehension: negative Cross Arm: negative Drop Arm: negative Hawkin's test: negative Impingement: negative Sulcus: absent  Other  Erythema: absent Scars: absent Sensation: normal Pulse: present   Left Shoulder Exam   Tenderness  Left shoulder tenderness location: Greater tuberosity tenderness.  Range of Motion  Left shoulder active abduction: The patient has passive full range of motion and 125 of active forward elevation.  Passive Abduction: normal  Extension: normal  External Rotation: normal  Internal Rotation 0 degrees: abnormal  Internal Rotation 90 degrees: abnormal   Muscle Strength  Internal Rotation: 5/5  External Rotation: 5/5  Supraspinatus: 4/5  Subscapularis: 5/5  Biceps: 5/5   Tests  Apprehension: negative Cross Arm: negative Drop Arm: negative Hawkin's test: positive Impingement: positive Sulcus: absent  Other  Erythema: absent Scars: absent Sensation: normal Pulse: present      Imaging shoulder 2 views normal      Assessment & Plan:  Rotator cuff syndrome left shoulder  Home exercise program subacromial injection  Recommend lumbar spine rehabilitation therapy   Subacromial Shoulder Injection Procedure Note  Pre-operative Diagnosis: left RC Syndrome  Post-operative Diagnosis: same  Indications: pain   Anesthesia: ethyl chloride   Procedure Details   Verbal consent was obtained for the procedure. The shoulder was prepped withalcohol and the skin was anesthetized. A 20 gauge needle was advanced into the subacromial space through posterior approach without difficulty  The space was then injected with  3 ml 1% lidocaine and 1 ml of depomedrol. The injection site was cleansed with isopropyl alcohol and a dressing was applied.  Complications:  None; patient tolerated the procedure well.

## 2012-09-06 ENCOUNTER — Ambulatory Visit (HOSPITAL_COMMUNITY)
Admission: RE | Admit: 2012-09-06 | Discharge: 2012-09-06 | Disposition: A | Discharge status: Home / Self Care | Payer: Medicare Other | Source: Ambulatory Visit | Attending: Orthopedic Surgery | Admitting: Orthopedic Surgery

## 2012-09-06 DIAGNOSIS — R262 Difficulty in walking, not elsewhere classified: Secondary | ICD-10-CM | POA: Insufficient documentation

## 2012-09-06 DIAGNOSIS — M6281 Muscle weakness (generalized): Secondary | ICD-10-CM | POA: Diagnosis not present

## 2012-09-06 DIAGNOSIS — M545 Low back pain, unspecified: Secondary | ICD-10-CM | POA: Insufficient documentation

## 2012-09-06 DIAGNOSIS — R29898 Other symptoms and signs involving the musculoskeletal system: Secondary | ICD-10-CM | POA: Insufficient documentation

## 2012-09-06 DIAGNOSIS — IMO0001 Reserved for inherently not codable concepts without codable children: Secondary | ICD-10-CM | POA: Insufficient documentation

## 2012-09-06 DIAGNOSIS — M25519 Pain in unspecified shoulder: Secondary | ICD-10-CM | POA: Insufficient documentation

## 2012-09-06 NOTE — Evaluation (Signed)
Physical Therapy Evaluation  Patient Details  Name: RAIANNA NOVOTNEY MRN: [id number] Date of Birth: October 20, 1925  Today's Date: 09/06/2012 Time: 0930-1020 PT Time Calculation (min): 50 min  Visit#: 1  of 12   Re-eval: 10/06/12 Assessment Diagnosis: low back pain Prior Therapy: none  Authorization: Medicare  Authorization Time Period:    Authorization Visit#: 1  of 10    Past Medical History:  Past Medical History  Diagnosis Date  . Hypertension   . Thyroid condition   . Abdominal adhesions    Past Surgical History:  Past Surgical History  Procedure Date  . Abdominal hysterectomy   . Abdominal surgery   . Colonoscopy   . Upper gastrointestinal endoscopy   . Mastectomy right breast    Subjective Symptoms/Limitations Symptoms: Ms. Pearson Forster states that she has been having back pain for three months.  She states there was no injury.  The pt states she has pain going across both sides of her back.  She does have some groin pain B.  She has been referred to PT to try and decrease he pain and improve her functional tolerance. How long can you sit comfortably?: The pt states she is able to sit as long as she likes. How long can you stand comfortably?: The patient states after standing for a few minutes she begins having a tired feeling in her back and then the pain starts and she needs to sit down after ten to fifteen minutes. How long can you walk comfortably?: The patient states that she notices immediate pain with walking.  She can walk for fifteen to twenty minutes but it is painful. Pain Assessment Currently in Pain?: No/denies Pain Score:  (The worst pain is an 8/10) Pain Location: Back Pain Orientation: Lower;Right;Left Pain Type: Chronic pain Pain Radiating Towards: groin area B Pain Relieving Factors: IBuprofen helps pain Effect of Pain on Daily Activities: increases with increased pain  Precautions/Restrictions     Prior Functioning  Home Living Lives With:  Alone Prior Function Level of Independence: Independent with basic ADLs Leisure: Hobbies-yes (Comment) Comments: walking but is no longer doing this.  Cognition/Observation Cognition Overall Cognitive Status: Appears within functional limits for tasks assessed  Sensation/Coordination/Flexibility/Functional Tests Functional Tests Functional Tests: LEFS 32/50=64/100  Assessment RLE Strength Right Hip Flexion: 3+/5 Right Hip Extension: 4/5 Right Hip ABduction: 3+/5 Right Hip ADduction: 3+/5 Right Knee Flexion: 5/5 Right Knee Extension: 4/5 Right Ankle Dorsiflexion: 3+/5 LLE Strength Left Hip Flexion: 5/5 Left Hip Extension: 4/5 Left Hip ABduction: 5/5 Left Hip ADduction: 5/5 Left Knee Flexion: 5/5 Left Knee Extension: 5/5 Left Ankle Dorsiflexion: 4/5 Lumbar AROM Lumbar Flexion: wnl Lumbar Extension: wnl Lumbar - Right Side Bend: wnl Lumbar - Left Side Bend: wnl Lumbar - Right Rotation: wnl Lumbar - Left Rotation: wnl Palpation Palpation: tight lumbar paraspinal mm B  Exercise/Treatments    Stretches Active Hamstring Stretch: 3 reps;30 seconds Single Knee to Chest Stretch: 3 reps;30 seconds     Supine Ab Set: 5 reps Bridge: 5 reps  Physical Therapy Assessment and Plan PT Assessment and Plan Clinical Impression Statement: Pt with signs and symptoms of instability who will benefit from core stabilization program to improve functional tolerance and quality of life. Pt will benefit from skilled therapeutic intervention in order to improve on the following deficits: Decreased activity tolerance;Difficulty walking;Decreased strength;Pain PT Frequency: Min 3X/week PT Duration: 4 weeks PT Treatment/Interventions: Therapeutic activities;Therapeutic exercise;Manual techniques;Modalities PT Plan: begin clam; B arm flexion, hip isometric and dead bug along with STM  for tight paraspinal mm    Goals Home Exercise Program Pt will Perform Home Exercise Program:  Independently PT Short Term Goals Time to Complete Short Term Goals: 2 weeks PT Short Term Goal 1: Pt to state her pain has not been above a 6 PT Short Term Goal 2: Pt to be able to stand for 15 minutes without increase pain PT Short Term Goal 3: Pt to be able to walk for 15 minutes witthout pain PT Long Term Goals Time to Complete Long Term Goals: 4 weeks PT Long Term Goal 1: I in advance HEP PT Long Term Goal 2: PT to state that he pain is not greater than a 2 80% of the time Long Term Goal 3: Pt able to walk for 45 minutes without pain Long Term Goal 4: Pt to be able to stand for 30 minutes to make a meal without pain.  Problem List Patient Active Problem List  Diagnosis  . Chronic diarrhea  . Intestinal bacterial overgrowth  . Hypertension  . Osteoporosis  . Anemia  . Vaginal Discharge  . Lower back pain  . Shoulder pain, left  . Rotator cuff syndrome of left shoulder  . Weakness of right leg  . Difficulty in walking    PT - End of Session Activity Tolerance: Patient tolerated treatment well General Behavior During Session: [facility] for tasks performed PT Plan of Care PT Home Exercise Plan: given PT Patient Instructions: do 2x/day  GP Functional Assessment Tool Used: Oswestry back scale Functional Limitation: Mobility: Walking and moving around Mobility: Walking and Moving Around Current Status [id number]): At least 60 percent but less than 80 percent impaired, limited or restricted Mobility: Walking and Moving Around Goal Status [id number]): At least 1 percent but less than 20 percent impaired, limited or restricted  RUSSELL,CINDY 09/06/2012, 10:31 AM  Physician Documentation Your signature is required to indicate approval of the treatment plan as stated above.  Please sign and either send electronically or make a copy of this report for your files and return this physician signed original.   Please mark one 1.__approve of plan  2. ___approve of plan with the following  conditions.   ______________________________                                                          _____________________ Physician Signature                                                                                                             Date out

## 2012-09-08 ENCOUNTER — Ambulatory Visit (HOSPITAL_COMMUNITY)
Admission: RE | Admit: 2012-09-08 | Discharge: 2012-09-08 | Disposition: A | Discharge status: Home / Self Care | Payer: Medicare Other | Source: Ambulatory Visit | Attending: Family Medicine | Admitting: Family Medicine

## 2012-09-08 DIAGNOSIS — R29898 Other symptoms and signs involving the musculoskeletal system: Secondary | ICD-10-CM

## 2012-09-08 DIAGNOSIS — R262 Difficulty in walking, not elsewhere classified: Secondary | ICD-10-CM

## 2012-09-08 NOTE — Progress Notes (Addendum)
Physical Therapy Treatment Patient Details  Name: IREANNA TAIWO MRN: [id number] Date of Birth: [birth year]  Today's Date: 09/08/2012 Time: [id number] PT Time Calculation (min): 43 min  Visit#: 2  of 12   Re-eval: 10/06/12    Authorization: medicare  Authorization Visit#: 2  of 10    Subjective: Symptoms/Limitations Symptoms: Ms. Hagelstein states that she has been doing her ex pt is sore. Pain Assessment Currently in Pain?: Yes Pain Score:   4 Pain Location: Back Pain Orientation: Right;Left;Lower  Precautions/Restrictions     Exercise/Treatments Stretches Active Hamstring Stretch: 3 reps;30 seconds Single Knee to Chest Stretch: 3 reps;30 seconds   Supine Clam: 10 reps Dead Bug: 10 reps Bridge: 10 reps Isometric Hip Flexion: 10 reps Other Supine Lumbar Exercises:  b UE flex x 10    Physical Therapy Assessment and Plan PT Assessment and Plan Clinical Impression Statement: Pt demonstrated good form with new ex with verbal cuing.  Pt radicular pain into hip decreased after treatment. PT Plan: Pt to begin t-band exercises for posture next treatment.    Goals    Problem List Patient Active Problem List  Diagnosis  . Chronic diarrhea  . Intestinal bacterial overgrowth  . Hypertension  . Osteoporosis  . Anemia  . Vaginal Discharge  . Lower back pain  . Shoulder pain, left  . Rotator cuff syndrome of left shoulder  . Weakness of right leg  . Difficulty in walking    General Behavior During Session: [facility] for tasks performed Cognition: [facility] for tasks performed PT Plan of Care PT Home Exercise Plan: given  GP    RUSSELL,CINDY 09/08/2012, 9:35 AM

## 2012-09-09 DIAGNOSIS — D649 Anemia, unspecified: Secondary | ICD-10-CM | POA: Diagnosis not present

## 2012-09-10 ENCOUNTER — Ambulatory Visit (HOSPITAL_COMMUNITY)
Admission: RE | Admit: 2012-09-10 | Discharge: 2012-09-10 | Disposition: A | Discharge status: Home / Self Care | Payer: Medicare Other | Source: Ambulatory Visit | Attending: Orthopedic Surgery | Admitting: Orthopedic Surgery

## 2012-09-10 NOTE — Progress Notes (Signed)
Physical Therapy Treatment Patient Details  Name: QUANTAVIA LAMOTTE MRN: [id number] Date of Birth: 1925-01-22  Today's Date: 09/10/2012 Time: [id number] PT Time Calculation (min): 43 min  Visit#: 3  of 12   Re-eval: 10/06/12    Authorization: medicare    Authorization Visit#: 3  of 10    Subjective: Symptoms/Limitations Symptoms: Pt states that she is having pain and soreness especially getting in and out of the car Pain Assessment Currently in Pain?: Yes Pain Score:   7 Pain Location: Back Pain Orientation: Right;Left;Lower Pain Type: Chronic pain    Exercise/Treatments     Stretches Active Hamstring Stretch: 3 reps;30 seconds Single Knee to Chest Stretch: 3 reps;30 seconds   Standing Scapular Retraction: Strengthening;Both;Theraband Theraband Level (Scapular Retraction): Level 3 (Green) Row: Strengthening;Both;10 reps;Theraband Theraband Level (Row): Level 3 (Green) Shoulder Extension: Strengthening;Both;10 reps Other Standing Lumbar Exercises: wall arch x 10    Supine Clam: 10 reps Dead Bug: 10 reps Bridge: 10 reps Straight Leg Raise: 10 reps  Manual Therapy Manual Therapy: Massage Massage: STM to B Lumbar area to decrease tighness and pain.  Physical Therapy Assessment and Plan PT Assessment and Plan Clinical Impression Statement: Pt needed verbal cuing for proper stab with standing activity.  Pt demonstrates good form with supine ex PT Plan: begin prone ex next treatment;  if pain is still high try [name] instead of massage .    Goals    Problem List Patient Active Problem List  Diagnosis  . Chronic diarrhea  . Intestinal bacterial overgrowth  . Hypertension  . Osteoporosis  . Anemia  . Vaginal Discharge  . Lower back pain  . Shoulder pain, left  . Rotator cuff syndrome of left shoulder  . Weakness of right leg  . Difficulty in walking    General Cognition: [facility] for tasks performed  GP    RUSSELL,CINDY 09/10/2012, 4:23 PM

## 2012-09-13 ENCOUNTER — Ambulatory Visit (HOSPITAL_COMMUNITY)
Admission: RE | Admit: 2012-09-13 | Discharge: 2012-09-13 | Disposition: A | Discharge status: Home / Self Care | Payer: Medicare Other | Source: Ambulatory Visit | Attending: Orthopedic Surgery | Admitting: Orthopedic Surgery

## 2012-09-13 DIAGNOSIS — M25519 Pain in unspecified shoulder: Secondary | ICD-10-CM | POA: Diagnosis not present

## 2012-09-13 DIAGNOSIS — R262 Difficulty in walking, not elsewhere classified: Secondary | ICD-10-CM | POA: Diagnosis not present

## 2012-09-13 DIAGNOSIS — M545 Low back pain, unspecified: Secondary | ICD-10-CM | POA: Diagnosis not present

## 2012-09-13 DIAGNOSIS — M6281 Muscle weakness (generalized): Secondary | ICD-10-CM | POA: Diagnosis not present

## 2012-09-13 DIAGNOSIS — IMO0001 Reserved for inherently not codable concepts without codable children: Secondary | ICD-10-CM | POA: Diagnosis not present

## 2012-09-13 NOTE — Progress Notes (Signed)
Physical Therapy Treatment Patient Details  Name: Jane Andrews MRN: [id number] Date of Birth: [birth year]  Today's Date: 09/13/2012 Time: 0850-0930 PT Time Calculation (min): 40 min Charges:  therex 28', ultrasound 10' Visit#: 4  of 12   Re-eval: 10/06/12    Authorization: Medicare  Authorization Visit#: 4  of 10    Subjective: Symptoms/Limitations Symptoms: Pt. states she feels the pain is getting worse; states she is unable to get comfortable at night and is not sleeping.  States she plans to return to [facility] and request more testing. Pain Assessment Currently in Pain?: Yes Pain Score:   7 Pain Location: Back Pain Orientation: Mid;Lower   Exercise/Treatments Stretches Active Hamstring Stretch: 3 reps;30 seconds Single Knee to Chest Stretch: 3 reps;30 seconds Standing Scapular Retraction: Strengthening;Both;Theraband Theraband Level (Scapular Retraction): Level 3 (Green) Row: Strengthening;Both;10 reps;Theraband Theraband Level (Row): Level 3 (Green) Shoulder Extension: Strengthening;Both;10 reps;Theraband Theraband Level (Shoulder Extension): Level 3 (Green) Prone  Other Prone Lumbar Exercises: heelsqueezes 10X5" Other Prone Lumbar Exercises: IR/ER each LE 10 reps each   Modalities Modalities: Ultrasound Ultrasound Ultrasound Location: Lower Back, 5' each side Ultrasound Parameters: 1.5 w/cm2 continuous, 5' each side prone lying Ultrasound Goals: Pain  Physical Therapy Assessment and Plan PT Assessment and Plan Clinical Impression Statement: Focused treatment on pain relief.  Began prone stab exercises with tactile cues for stability.  Instructed pt to lay prone and POE more often at home to see if help eases discomfort.  Reported immediate relief at end of session with pain decreased 2 levels. PT Plan: Progress prone exercises; Assess pain next visit and see if [name] helps reduce pain.     Problem List Patient Active Problem List  Diagnosis  . Chronic  diarrhea  . Intestinal bacterial overgrowth  . Hypertension  . Osteoporosis  . Anemia  . Vaginal Discharge  . Lower back pain  . Shoulder pain, left  . Rotator cuff syndrome of left shoulder  . Weakness of right leg  . Difficulty in walking    General Behavior During Session: [facility] for tasks performed Cognition: [facility] for tasks performed   Teena Irani, PTA/CLT 09/13/2012, 10:07 AM

## 2012-09-15 ENCOUNTER — Ambulatory Visit (HOSPITAL_COMMUNITY)
Admission: RE | Admit: 2012-09-15 | Discharge: 2012-09-15 | Disposition: A | Discharge status: Home / Self Care | Payer: Medicare Other | Source: Ambulatory Visit | Attending: Orthopedic Surgery | Admitting: Orthopedic Surgery

## 2012-09-15 ENCOUNTER — Telehealth (INDEPENDENT_AMBULATORY_CARE_PROVIDER_SITE_OTHER): Payer: Self-pay | Admitting: *Deleted

## 2012-09-15 ENCOUNTER — Other Ambulatory Visit (HOSPITAL_COMMUNITY): Payer: Self-pay | Admitting: Family Medicine

## 2012-09-15 DIAGNOSIS — D649 Anemia, unspecified: Secondary | ICD-10-CM

## 2012-09-15 DIAGNOSIS — R262 Difficulty in walking, not elsewhere classified: Secondary | ICD-10-CM | POA: Diagnosis not present

## 2012-09-15 DIAGNOSIS — M25519 Pain in unspecified shoulder: Secondary | ICD-10-CM | POA: Insufficient documentation

## 2012-09-15 DIAGNOSIS — IMO0001 Reserved for inherently not codable concepts without codable children: Secondary | ICD-10-CM | POA: Diagnosis not present

## 2012-09-15 DIAGNOSIS — M5430 Sciatica, unspecified side: Secondary | ICD-10-CM

## 2012-09-15 DIAGNOSIS — IMO0002 Reserved for concepts with insufficient information to code with codable children: Secondary | ICD-10-CM | POA: Diagnosis not present

## 2012-09-15 DIAGNOSIS — M6281 Muscle weakness (generalized): Secondary | ICD-10-CM | POA: Insufficient documentation

## 2012-09-15 DIAGNOSIS — M545 Low back pain, unspecified: Secondary | ICD-10-CM | POA: Diagnosis not present

## 2012-09-15 DIAGNOSIS — N760 Acute vaginitis: Secondary | ICD-10-CM | POA: Diagnosis not present

## 2012-09-15 NOTE — Progress Notes (Signed)
Physical Therapy Treatment Patient Details  Name: Jane Andrews MRN: [id number] Date of Birth: August 19, 1925  Today's Date: 09/15/2012 Time: 0930-1018 PT Time Calculation (min): 48 min  Visit#: 5  of 12   Re-eval: 10/06/12 Charges: Therex x 32' [name] x 10'  Authorization: Medicare  Authorization Visit#: 5  of 10    Subjective: Symptoms/Limitations Symptoms: Pt states that she has not been completing her HEP because she hurt s so bad at night. Pain Assessment Currently in Pain?: No/denies Pain Score:   5 Pain Location: Back Pain Orientation: Lower   Exercise/Treatments Stretches Active Hamstring Stretch: 3 reps;30 seconds Single Knee to Chest Stretch: 3 reps;30 seconds Standing Scapular Retraction: Strengthening;Both;Theraband Theraband Level (Scapular Retraction): Level 3 (Green) Row: Strengthening;Both;10 reps;Theraband Theraband Level (Row): Level 3 (Green) Shoulder Extension: Strengthening;Both;10 reps;Theraband Theraband Level (Shoulder Extension): Level 3 (Green) Sidelying Clam: 5 reps;Limitations Clam Limitations: 10" holds Prone  Other Prone Lumbar Exercises: heelsqueezes 10X5" Other Prone Lumbar Exercises: IR/ER each LE 10 reps each  Modalities Modalities: Ultrasound Ultrasound Ultrasound Location: B lumbar Ultrasound Parameters: 1.5 w/cm2 continuous, 5' each side prone lying  Ultrasound Goals: Pain  Physical Therapy Assessment and Plan PT Assessment and Plan Clinical Impression Statement: Pt completes therex well after cueing for proper form. [name] completed again this session secondary to decreased pain after last session. [name] in [place] as prone position made pt nauseated last session. Pt reports pain decrease to 0/10 at end of session. PT Plan: Continue to progress strength/flexibility per PT POC.      Problem List Patient Active Problem List  Diagnosis  . Chronic diarrhea  . Intestinal bacterial overgrowth  . Hypertension  . Osteoporosis  . Anemia  .  Vaginal Discharge  . Lower back pain  . Shoulder pain, left  . Rotator cuff syndrome of left shoulder  . Weakness of right leg  . Difficulty in walking    PT - End of Session Activity Tolerance: Patient tolerated treatment well General Behavior During Session: [facility] for tasks performed Cognition: [facility] for tasks performed  Rachelle Hora, PTA 09/15/2012, 10:38 AM

## 2012-09-15 NOTE — Telephone Encounter (Signed)
Per Dr.Rehman the patient will need to take Ferrous Sulfate as he directed. Repeat H/H in 8 weeks. This has been noted for November 10 2012.

## 2012-09-16 ENCOUNTER — Ambulatory Visit (HOSPITAL_COMMUNITY): Payer: Medicare Other

## 2012-09-17 ENCOUNTER — Ambulatory Visit (HOSPITAL_COMMUNITY)
Admission: RE | Admit: 2012-09-17 | Discharge: 2012-09-17 | Disposition: A | Discharge status: Home / Self Care | Payer: Medicare Other | Source: Ambulatory Visit | Attending: Family Medicine | Admitting: Family Medicine

## 2012-09-17 ENCOUNTER — Inpatient Hospital Stay (HOSPITAL_COMMUNITY): Admission: RE | Admit: 2012-09-17 | Payer: Medicare Other | Source: Ambulatory Visit | Admitting: Physical Therapy

## 2012-09-17 DIAGNOSIS — M5126 Other intervertebral disc displacement, lumbar region: Secondary | ICD-10-CM | POA: Diagnosis not present

## 2012-09-17 DIAGNOSIS — M5430 Sciatica, unspecified side: Secondary | ICD-10-CM

## 2012-09-17 DIAGNOSIS — M545 Low back pain, unspecified: Secondary | ICD-10-CM | POA: Insufficient documentation

## 2012-09-17 DIAGNOSIS — M79609 Pain in unspecified limb: Secondary | ICD-10-CM | POA: Diagnosis not present

## 2012-09-17 DIAGNOSIS — M48061 Spinal stenosis, lumbar region without neurogenic claudication: Secondary | ICD-10-CM | POA: Diagnosis not present

## 2012-09-17 DIAGNOSIS — M47817 Spondylosis without myelopathy or radiculopathy, lumbosacral region: Secondary | ICD-10-CM | POA: Diagnosis not present

## 2012-09-17 LAB — POCT I-STAT, CHEM 8
Creatinine, Ser: 1.5 mg/dL — ABNORMAL HIGH (ref 0.50–1.10)
Glucose, Bld: 103 mg/dL — ABNORMAL HIGH (ref 70–99)
Hemoglobin: 10.9 g/dL — ABNORMAL LOW (ref 12.0–15.0)
Potassium: 3.9 mEq/L (ref 3.5–5.1)

## 2012-09-17 MED ORDER — GADOBENATE DIMEGLUMINE 529 MG/ML IV SOLN
5.0000 mL | Freq: Once | INTRAVENOUS | Status: AC | PRN
  Administered 2012-09-17: 5 mL via INTRAVENOUS

## 2012-09-17 NOTE — Progress Notes (Signed)
Blood sample obtained from left arm IV for Creatnine level.  

## 2012-09-18 ENCOUNTER — Encounter (HOSPITAL_COMMUNITY): Payer: Self-pay

## 2012-09-18 ENCOUNTER — Emergency Department (HOSPITAL_COMMUNITY)
Admission: EM | Admit: 2012-09-18 | Discharge: 2012-09-18 | Disposition: A | Discharge status: Home / Self Care | Payer: Medicare Other | Attending: Emergency Medicine | Admitting: Emergency Medicine

## 2012-09-18 DIAGNOSIS — E079 Disorder of thyroid, unspecified: Secondary | ICD-10-CM | POA: Diagnosis not present

## 2012-09-18 DIAGNOSIS — I10 Essential (primary) hypertension: Secondary | ICD-10-CM | POA: Diagnosis not present

## 2012-09-18 DIAGNOSIS — M5430 Sciatica, unspecified side: Secondary | ICD-10-CM | POA: Diagnosis not present

## 2012-09-18 DIAGNOSIS — M47817 Spondylosis without myelopathy or radiculopathy, lumbosacral region: Secondary | ICD-10-CM | POA: Diagnosis not present

## 2012-09-18 DIAGNOSIS — M25519 Pain in unspecified shoulder: Secondary | ICD-10-CM | POA: Insufficient documentation

## 2012-09-18 DIAGNOSIS — Z87891 Personal history of nicotine dependence: Secondary | ICD-10-CM | POA: Diagnosis not present

## 2012-09-18 DIAGNOSIS — M79609 Pain in unspecified limb: Secondary | ICD-10-CM | POA: Diagnosis not present

## 2012-09-18 DIAGNOSIS — Z886 Allergy status to analgesic agent status: Secondary | ICD-10-CM | POA: Insufficient documentation

## 2012-09-18 DIAGNOSIS — M545 Low back pain: Secondary | ICD-10-CM

## 2012-09-18 MED ORDER — PREDNISONE 20 MG PO TABS
60.0000 mg | ORAL_TABLET | Freq: Once | ORAL | Status: AC
  Administered 2012-09-18: 60 mg via ORAL
  Filled 2012-09-18: qty 3

## 2012-09-18 MED ORDER — PREDNISONE 20 MG PO TABS: ORAL_TABLET | ORAL | Status: DC

## 2012-09-18 MED ORDER — OXYCODONE-ACETAMINOPHEN 5-325 MG PO TABS: 1.0000 | ORAL_TABLET | Freq: Four times a day (QID) | ORAL | Status: DC | PRN

## 2012-09-18 MED ORDER — MORPHINE SULFATE 4 MG/ML IJ SOLN
4.0000 mg | Freq: Once | INTRAMUSCULAR | Status: AC
  Administered 2012-09-18: 4 mg via INTRAMUSCULAR
  Filled 2012-09-18: qty 1

## 2012-09-18 NOTE — ED Provider Notes (Addendum)
History  This chart was scribed for Mirna Mires, MD by Kevan Rosebush. This patient was seen in room APA10/[facility]10 and the patient's care was started at 14:09.   CSN: [id number]  Arrival date & time 09/18/12  1302   First MD Initiated Contact with Patient 09/18/12 1409      Chief Complaint  Patient presents with  . Back Pain    (Consider location/radiation/quality/duration/timing/severity/associated sxs/prior treatment) The history is provided by the patient. No language interpreter was used.  Jane Andrews is a 78 y.o. female who presents to the Emergency Department complaining of constant back and shoulder pain for the past 2-3 months. Pt denies any associated abdominal pain, or fever but reports her temperature has been around 99 and is 96 at baseline. Pt reports she went in for an MRI yesterday but has not yet heard the results. Pt repeorts she is taking hydrocodone with no relief from pain. Pt has been going to physical therapy which she claims worsened the pain.  No recent injury. No acute or abrupt change in pain or worsening today. No falls. No numbness/weakness. No fever or chills. Pain in low back, constant, dull, gets radiating/sharp pain towards hip and legs. No incontinence or retention problems.   Pt's PCP is Dr. Orson Ape and she knows Dr. Carloyn Manner in [place].  Past Medical History  Diagnosis Date  . Hypertension   . Thyroid condition   . Abdominal adhesions     Past Surgical History  Procedure Date  . Abdominal hysterectomy   . Abdominal surgery   . Colonoscopy   . Upper gastrointestinal endoscopy   . Mastectomy right breast    Family History  Problem Relation Age of Onset  . Anuerysm Father   . Rheum arthritis Sister   . Healthy Sister   . COPD Sister   . Healthy Brother   . Cancer      History  Substance Use Topics  . Smoking status: Former Smoker -- 1.5 packs/day for 20 years    Types: Cigarettes  . Smokeless tobacco: Never Used  . Alcohol Use: No     OB History    Grav Para Term Preterm Abortions TAB SAB Ect Mult Living                  Review of Systems  Constitutional: Negative for fever and fatigue.  HENT: Negative for congestion, sinus pressure and ear discharge.   Eyes: Negative for discharge.  Respiratory: Negative for cough.   Cardiovascular: Negative for chest pain.  Gastrointestinal: Negative for abdominal pain and diarrhea.  Genitourinary: Negative for frequency and hematuria.  Musculoskeletal: Positive for back pain.       Shoulder pain  Skin: Negative for rash.  Neurological: Negative for seizures and headaches.  Hematological: Negative.   Psychiatric/Behavioral: Negative for hallucinations.  All other systems reviewed and are negative.     Allergies  Codeine  Home Medications   Current Outpatient Rx  Name Route Sig Dispense Refill  . ALENDRONATE SODIUM 70 MG PO TABS Oral Take 70 mg by mouth every 7 (seven) days. Take with a full glass of water on an empty stomach. Saturday    . ALPRAZOLAM 1 MG PO TABS Oral Take 0.5 mg by mouth as needed. Patient states that when she takes it she takes 1/2 of a 1 mg    . VITAMIN D PO Oral Take 1,200 mg by mouth daily.    Marland Kitchen VITAMIN B-12 IJ Injection Inject as directed every  30 (thirty) days.    . DULOXETINE HCL 60 MG PO CPEP Oral Take 60 mg by mouth daily.    Marland Kitchen GLUCOSAMINE-CHONDROITIN 500-400 MG PO TABS Oral Take 1 tablet by mouth daily.    . ACIDOPHILUS 100 MG PO CAPS Oral Take by mouth daily.    Marland Kitchen LEVOTHYROXINE SODIUM 100 MCG PO TABS Oral Take by mouth daily. Patient takes 50 mg daily    . ONE-DAILY MULTI VITAMINS PO TABS Oral Take 1 tablet by mouth daily.    Marland Kitchen OLMESARTAN MEDOXOMIL-HCTZ 20-12.5 MG PO TABS Oral Take 1 tablet by mouth daily.      Marland Kitchen FISH OIL 1000 MG PO CAPS Oral Take by mouth daily.      Triage Vitals: BP 130/70  Pulse 98  Temp 99.1 F (37.3 C) (Oral)  Resp 18  Ht 5\' 4"  (1.626 m)  Wt 120 lb (54.432 kg)  BMI 20.60 kg/m2  SpO2 99%  Physical  Exam  Nursing note and vitals reviewed. Constitutional: She is oriented to person, place, and time. She appears well-developed and well-nourished. No distress.  HENT:  Head: Normocephalic and atraumatic.  Eyes: Conjunctivae normal and EOM are normal.  Neck: Neck supple. No tracheal deviation present.  Cardiovascular: Normal rate.   Pulmonary/Chest: Effort normal. No respiratory distress.  Abdominal: Soft. Bowel sounds are normal. She exhibits no distension and no mass. There is no tenderness. There is no rebound and no guarding.       No puls mass  Genitourinary:       No cva tenderness  Musculoskeletal: Normal range of motion. She exhibits no edema.       Lumbo-sacral muscular tenderness, but the spine is nontender.   CTLS spine, non tender, aligned, no step off.   Neurological: She is alert and oriented to person, place, and time.  Skin: Skin is warm and dry.  Psychiatric: She has a normal mood and affect.    ED Course  Procedures (including critical care time) DIAGNOSTIC STUDIES: Oxygen Saturation is 99% on room air, normal by my interpretation.    COORDINATION OF CARE: 14:25--I evaluated the patient and we discussed a treatment plan including pain medication, steroids, and follow up with a spinal specialist to which the pt agreed. I explained the findings from the pt's MRI yesterday: spinal stenosis.   Mr Lumbar Spine W Wo Contrast  09/17/2012  *RADIOLOGY REPORT*  Clinical Data: Low back pain and bilateral leg pain.  MRI LUMBAR SPINE WITHOUT AND WITH CONTRAST  Technique:  Multiplanar and multiecho pulse sequences of the lumbar spine were obtained without and with intravenous contrast.  Contrast: 28mL MULTIHANCE GADOBENATE DIMEGLUMINE 529 MG/ML IV SOLN  Comparison: CT scan of the abdomen and pelvis dated 06/13/2012  Findings: Tip of the conus is at L1-2 and appears normal.  T11-12 and T12-L1:  Normal.  L1-2:  Tiny broad-based disc bulge with no neural impingement.  L2-3:  Small  broad-based disc bulge with no neural impingement. Slight hypertrophy of the ligamenta flava and facet joints creates slight narrowing of the spinal canal.  L3-4:  2 mm spondylolisthesis with disc space narrowing.  Small focal disc protrusion into the right neural foramen adjacent to but not compressing the exiting nerve.  Small endplate osteophytes extend across the midline without impingement.  Moderately severe right facet arthritis.  L4-5:  3 mm spondylolisthesis with a small broad-based protrusion of the uncovered disc.  This extends into the right neural foramen without nerve root impingement.  Hypertrophy of the  ligamenta flava causes moderate spinal stenosis and bilateral lateral recess impingement.  No focal neural impingement.  L5-S1:  Marked disc space narrowing with small broad-based disc bulge.  Severe left foraminal stenosis.  The left L5 nerve is compressed in the neural foramen.  Severe left and mild right facet arthritis.  Left lateral recess stenosis appears to compress the left S1 nerve.  Paraspinal soft tissues demonstrate a large staghorn calculus in the lower pole of the right kidney as described on the prior CT scan.  IMPRESSION:  1.  Severe left foraminal and left lateral recess stenosis at L5-S1 which should affect the left L5 and S1 nerve roots respectively. 2.  Severe bilateral facet arthritis at L4-5 with moderate spinal stenosis and bilateral lateral recess narrowing. 3.  Moderately severe right facet arthritis at L3-4. 4.  After contrast administration there is enhancement primarily around the left facet joint at L5-S1 in the right facet joint at L4- 5 consistent with the arthritis.   Original Report Authenticated By: Larey Seat, M.D.      MDM  I personally performed the services described in this documentation, which was scribed in my presence. The recorded information has been reviewed and considered. Mirna Mires, MD   Pt drove self to ed. Prednisone po.  States the  hydrocodone doesn't control pain, will give rx percocet for home.  Discussed yesterdays mri w pt. Pt requests referral to Dr Carloyn Manner - will provide to her.  Pt states can get ride home, requests pain shot. Morphine im.    Mirna Mires, MD 09/18/12 [place], MD 09/18/12 [phone]

## 2012-09-18 NOTE — ED Notes (Signed)
Pt reports low back pain for months, had mri yesterday, today pain is worse than ever. Has taken her hydrocodone with no relief.

## 2012-09-18 NOTE — ED Notes (Signed)
Patient with no complaints at this time. Respirations even and unlabored. Skin warm/dry. Discharge instructions reviewed with patient at this time. Patient given opportunity to voice concerns/ask questions. Patient discharged at this time and left Emergency Department with steady gait with friend who is going to drive her home.Jane Andrews

## 2012-09-18 NOTE — ED Notes (Signed)
Patient called neighbors and they are coming to pick the patient up and take her home.

## 2012-09-20 ENCOUNTER — Ambulatory Visit (HOSPITAL_COMMUNITY): Payer: Medicare Other | Admitting: Physical Therapy

## 2012-09-22 ENCOUNTER — Ambulatory Visit (HOSPITAL_COMMUNITY): Payer: Medicare Other | Admitting: Physical Therapy

## 2012-09-22 DIAGNOSIS — E161 Other hypoglycemia: Secondary | ICD-10-CM | POA: Diagnosis not present

## 2012-09-24 ENCOUNTER — Ambulatory Visit (HOSPITAL_COMMUNITY): Payer: Medicare Other | Admitting: Physical Therapy

## 2012-09-27 ENCOUNTER — Inpatient Hospital Stay (HOSPITAL_COMMUNITY): Admission: RE | Admit: 2012-09-27 | Payer: Medicare Other | Source: Ambulatory Visit | Admitting: Physical Therapy

## 2012-09-29 ENCOUNTER — Ambulatory Visit (HOSPITAL_COMMUNITY): Payer: Medicare Other | Admitting: Physical Therapy

## 2012-10-01 ENCOUNTER — Ambulatory Visit (HOSPITAL_COMMUNITY): Payer: Medicare Other | Admitting: Physical Therapy

## 2012-10-11 ENCOUNTER — Encounter (INDEPENDENT_AMBULATORY_CARE_PROVIDER_SITE_OTHER): Payer: Self-pay | Admitting: Internal Medicine

## 2012-10-11 ENCOUNTER — Ambulatory Visit (INDEPENDENT_AMBULATORY_CARE_PROVIDER_SITE_OTHER): Payer: Medicare Other | Admitting: Internal Medicine

## 2012-10-11 VITALS — BP 116/72 | HR 76 | Temp 98.5°F | Resp 18 | Ht 64.0 in | Wt 122.4 lb

## 2012-10-11 DIAGNOSIS — D649 Anemia, unspecified: Secondary | ICD-10-CM | POA: Diagnosis not present

## 2012-10-11 DIAGNOSIS — K529 Noninfective gastroenteritis and colitis, unspecified: Secondary | ICD-10-CM

## 2012-10-11 DIAGNOSIS — R197 Diarrhea, unspecified: Secondary | ICD-10-CM

## 2012-10-11 NOTE — Progress Notes (Signed)
Presenting complaint;  Followup for diarrhea bloating and anemia.  Subjective:  Patient is 78 year old Caucasian female who is here for scheduled visit. She has no GI complaints. She was seen in emergency room on 09/15/2012 for excruciating back pain radiating into both lower extremities as well as her groins. She had MRI of her lumbar spine and was begun on Percocet and she has an appointment to see Dr. Sherwood Gambler in 10 days. She says that pain medicine she can function but pain has not gone away completely. She is not having any side effects. She reports that she is passing formed stools since she has been on pain medication. She is having 1 to 2 stools per day. She denies melena or rectal bleeding. Her appetite is good. She reports scant vaginal discharge. When she was seen in our office 3 months ago she had vaginal exam by Ms. Allen Norris NP and felt to have vaginitis and was given prescription for metronidazole. Her discharge resolved until now. She denies fever chills or vaginal bleeding.  Current Medications: Current Outpatient Prescriptions  Medication Sig Dispense Refill  . alendronate (FOSAMAX) 70 MG tablet Take 70 mg by mouth every 7 (seven) days. Take with a full glass of water on an empty stomach. Saturday      . ALPRAZolam (XANAX) 1 MG tablet Take 0.5 mg by mouth as needed. Patient states that when she takes it she takes 1/2 of a 1 mg      . Cholecalciferol (VITAMIN D PO) Take 1,200 mg by mouth daily.      . Cyanocobalamin (VITAMIN B-12 IJ) Inject as directed every 30 (thirty) days.      . DULoxetine (CYMBALTA) 60 MG capsule Take 60 mg by mouth daily.      Marland Kitchen glucosamine-chondroitin 500-400 MG tablet Take 1 tablet by mouth daily.      . Lactobacillus (ACIDOPHILUS) 100 MG CAPS Take by mouth daily.      Marland Kitchen levothyroxine (SYNTHROID, LEVOTHROID) 100 MCG tablet Take by mouth daily. Patient takes 50 mcg daily      . Multiple Vitamin (MULTIVITAMIN) tablet Take 1 tablet by mouth daily.      Marland Kitchen  olmesartan-hydrochlorothiazide (BENICAR HCT) 20-12.5 MG per tablet Take 1 tablet by mouth daily.        . Omega-3 Fatty Acids (FISH OIL) 1000 MG CAPS Take by mouth daily.      Marland Kitchen oxyCODONE-acetaminophen (PERCOCET/ROXICET) 5-325 MG per tablet Take 1 tablet by mouth every 6 (six) hours as needed for pain.  20 tablet  0     Objective: Blood pressure 116/72, pulse 76, temperature 98.5 F (36.9 C), temperature source Oral, resp. rate 18, height 5\' 4"  (1.626 m), weight 122 lb 6.4 oz (55.52 kg). Patient is alert. She is able to move from chair to examination table with minimal assistance. Conjunctiva is pink. Sclera is nonicteric Oropharyngeal mucosa is normal. No neck masses or thyromegaly noted. Cardiac exam with regular rhythm normal S1 and S2. No murmur or gallop noted. Lungs are clear to auscultation. Abdomen. Bowel sounds are normal. Abdomen is soft and nontende without organomegaly or masses. No LE edema or clubbing noted.    Assessment:  #1. Anemia. H&H is coming up. Hemoglobin was 10.9 when she was seen in emergency room 3-1/2 weeks ago. She denies melena or rectal bleeding. #2. Diarrhea and bloating secondary to short bowel syndrome and bacterial overgrowth. Last time she was given antibiotic was 3 months ago and now she's having normal stools since she  has been on Percocet for back pain.   Plan:  Take Flintstones with iron 1 tablet twice a day or 3 times a day as tolerated. Hemoccult x1 H&H in 4 weeks. Patient reminded that she cannot take NSAIDs For more than 2 weeks if this therapy is recommended by her neurosurgeon since she has history of GI bleed secondary to PUD. She will followup with Dr. Orson Ape regarding vaginal discharge.  Office visit in 6 months.

## 2012-10-11 NOTE — Patient Instructions (Addendum)
Try Flintstones with iron 1 tablet twice daily or 3 times a day.  Remember you cannot take NSAIDs or arthritis medications or more than 2 weeks because of history of ulcers and GI bleeding. Hemoglobin and hematocrit to be checked in 4 weeks. Hemoccult x1

## 2012-10-13 ENCOUNTER — Telehealth (INDEPENDENT_AMBULATORY_CARE_PROVIDER_SITE_OTHER): Payer: Self-pay | Admitting: *Deleted

## 2012-10-13 ENCOUNTER — Encounter (INDEPENDENT_AMBULATORY_CARE_PROVIDER_SITE_OTHER): Payer: Self-pay | Admitting: *Deleted

## 2012-10-13 DIAGNOSIS — D649 Anemia, unspecified: Secondary | ICD-10-CM

## 2012-10-13 NOTE — Telephone Encounter (Signed)
Per Dr.Rehman the patient will need to have H/H in 1 month. This is noted for November 21,2013

## 2012-10-13 NOTE — Telephone Encounter (Signed)
Lab work due 

## 2012-10-15 DIAGNOSIS — I4891 Unspecified atrial fibrillation: Secondary | ICD-10-CM

## 2012-10-15 HISTORY — DX: Unspecified atrial fibrillation: I48.91

## 2012-10-21 ENCOUNTER — Inpatient Hospital Stay (HOSPITAL_COMMUNITY)
Admission: EM | Admit: 2012-10-21 | Discharge: 2012-10-24 | DRG: 313 | Disposition: A | Discharge status: Home / Self Care | Payer: Medicare Other | Attending: Internal Medicine | Admitting: Internal Medicine

## 2012-10-21 ENCOUNTER — Encounter (HOSPITAL_COMMUNITY): Payer: Self-pay | Admitting: Emergency Medicine

## 2012-10-21 ENCOUNTER — Emergency Department (HOSPITAL_COMMUNITY): Payer: Medicare Other

## 2012-10-21 DIAGNOSIS — I059 Rheumatic mitral valve disease, unspecified: Secondary | ICD-10-CM | POA: Diagnosis not present

## 2012-10-21 DIAGNOSIS — Z7982 Long term (current) use of aspirin: Secondary | ICD-10-CM

## 2012-10-21 DIAGNOSIS — N184 Chronic kidney disease, stage 4 (severe): Secondary | ICD-10-CM | POA: Diagnosis present

## 2012-10-21 DIAGNOSIS — R072 Precordial pain: Secondary | ICD-10-CM | POA: Diagnosis not present

## 2012-10-21 DIAGNOSIS — D649 Anemia, unspecified: Secondary | ICD-10-CM | POA: Diagnosis not present

## 2012-10-21 DIAGNOSIS — R6889 Other general symptoms and signs: Secondary | ICD-10-CM | POA: Diagnosis not present

## 2012-10-21 DIAGNOSIS — I4891 Unspecified atrial fibrillation: Secondary | ICD-10-CM | POA: Diagnosis not present

## 2012-10-21 DIAGNOSIS — I959 Hypotension, unspecified: Secondary | ICD-10-CM | POA: Diagnosis not present

## 2012-10-21 DIAGNOSIS — N183 Chronic kidney disease, stage 3 unspecified: Secondary | ICD-10-CM | POA: Diagnosis not present

## 2012-10-21 DIAGNOSIS — M549 Dorsalgia, unspecified: Secondary | ICD-10-CM | POA: Diagnosis present

## 2012-10-21 DIAGNOSIS — I10 Essential (primary) hypertension: Secondary | ICD-10-CM | POA: Diagnosis not present

## 2012-10-21 DIAGNOSIS — G8929 Other chronic pain: Secondary | ICD-10-CM | POA: Diagnosis present

## 2012-10-21 DIAGNOSIS — I129 Hypertensive chronic kidney disease with stage 1 through stage 4 chronic kidney disease, or unspecified chronic kidney disease: Secondary | ICD-10-CM | POA: Diagnosis not present

## 2012-10-21 DIAGNOSIS — R079 Chest pain, unspecified: Secondary | ICD-10-CM | POA: Diagnosis not present

## 2012-10-21 LAB — CBC WITH DIFFERENTIAL/PLATELET
Basophils Relative: 0 % (ref 0–1)
Eosinophils Absolute: 0.2 10*3/uL (ref 0.0–0.7)
Lymphs Abs: 0.8 10*3/uL (ref 0.7–4.0)
MCH: 27.8 pg (ref 26.0–34.0)
MCHC: 32.3 g/dL (ref 30.0–36.0)
Neutrophils Relative %: 80 % — ABNORMAL HIGH (ref 43–77)
Platelets: 275 10*3/uL (ref 150–400)
RBC: 3.35 MIL/uL — ABNORMAL LOW (ref 3.87–5.11)

## 2012-10-21 LAB — COMPREHENSIVE METABOLIC PANEL
AST: 12 U/L (ref 0–37)
Albumin: 3.2 g/dL — ABNORMAL LOW (ref 3.5–5.2)
Calcium: 9.5 mg/dL (ref 8.4–10.5)
Creatinine, Ser: 1.41 mg/dL — ABNORMAL HIGH (ref 0.50–1.10)
Total Protein: 7.1 g/dL (ref 6.0–8.3)

## 2012-10-21 LAB — PROTIME-INR
INR: 1.05 (ref 0.00–1.49)
Prothrombin Time: 13.6 seconds (ref 11.6–15.2)

## 2012-10-21 LAB — MRSA PCR SCREENING: MRSA by PCR: NEGATIVE

## 2012-10-21 MED ORDER — ASPIRIN EC 81 MG PO TBEC
81.0000 mg | DELAYED_RELEASE_TABLET | Freq: Every day | ORAL | Status: DC
  Administered 2012-10-21 – 2012-10-24 (×4): 81 mg via ORAL
  Filled 2012-10-21 (×4): qty 1

## 2012-10-21 MED ORDER — DIGOXIN 0.25 MG/ML IJ SOLN: 0.1250 mg | Freq: Once | INTRAMUSCULAR | Status: DC

## 2012-10-21 MED ORDER — SODIUM CHLORIDE 0.9 % IV SOLN: INTRAVENOUS | Status: AC

## 2012-10-21 MED ORDER — ACETAMINOPHEN 650 MG RE SUPP: 650.0000 mg | Freq: Four times a day (QID) | RECTAL | Status: DC | PRN

## 2012-10-21 MED ORDER — ONDANSETRON HCL 4 MG/2ML IJ SOLN: 4.0000 mg | Freq: Four times a day (QID) | INTRAMUSCULAR | Status: DC | PRN

## 2012-10-21 MED ORDER — ALPRAZOLAM 0.5 MG PO TABS
0.5000 mg | ORAL_TABLET | Freq: Once | ORAL | Status: AC
  Administered 2012-10-21: 0.5 mg via ORAL

## 2012-10-21 MED ORDER — POTASSIUM CHLORIDE IN NACL 20-0.9 MEQ/L-% IV SOLN
INTRAVENOUS | Status: DC
  Administered 2012-10-21 – 2012-10-22 (×2): via INTRAVENOUS

## 2012-10-21 MED ORDER — DILTIAZEM HCL 50 MG/10ML IV SOLN
10.0000 mg | Freq: Once | INTRAVENOUS | Status: AC
  Administered 2012-10-21: 10 mg via INTRAVENOUS
  Filled 2012-10-21: qty 2

## 2012-10-21 MED ORDER — LEVOTHYROXINE SODIUM 25 MCG PO TABS
50.0000 ug | ORAL_TABLET | Freq: Every day | ORAL | Status: DC
  Administered 2012-10-21 – 2012-10-24 (×4): 50 ug via ORAL
  Filled 2012-10-21 (×4): qty 2

## 2012-10-21 MED ORDER — OXYCODONE-ACETAMINOPHEN 5-325 MG PO TABS
1.0000 | ORAL_TABLET | Freq: Four times a day (QID) | ORAL | Status: DC | PRN
  Administered 2012-10-21 – 2012-10-23 (×3): 1 via ORAL
  Filled 2012-10-21 (×3): qty 1

## 2012-10-21 MED ORDER — AMIODARONE HCL IN DEXTROSE 360-4.14 MG/200ML-% IV SOLN
30.0000 mg/h | INTRAVENOUS | Status: DC
  Administered 2012-10-21: 30 mg/h via INTRAVENOUS
  Filled 2012-10-21: qty 200

## 2012-10-21 MED ORDER — ACETAMINOPHEN 325 MG PO TABS
650.0000 mg | ORAL_TABLET | Freq: Four times a day (QID) | ORAL | Status: DC | PRN
  Administered 2012-10-22 – 2012-10-24 (×6): 650 mg via ORAL
  Filled 2012-10-21 (×6): qty 2

## 2012-10-21 MED ORDER — ONDANSETRON HCL 4 MG/2ML IJ SOLN: 4.0000 mg | Freq: Three times a day (TID) | INTRAMUSCULAR | Status: DC | PRN

## 2012-10-21 MED ORDER — ONDANSETRON HCL 4 MG PO TABS
4.0000 mg | ORAL_TABLET | Freq: Four times a day (QID) | ORAL | Status: DC | PRN
  Administered 2012-10-23: 4 mg via ORAL
  Filled 2012-10-21: qty 1

## 2012-10-21 MED ORDER — DILTIAZEM HCL 100 MG IV SOLR
5.0000 mg/h | INTRAVENOUS | Status: DC
  Administered 2012-10-21: 5 mg/h via INTRAVENOUS
  Filled 2012-10-21: qty 100

## 2012-10-21 MED ORDER — DULOXETINE HCL 60 MG PO CPEP
60.0000 mg | ORAL_CAPSULE | Freq: Every day | ORAL | Status: DC
  Administered 2012-10-21 – 2012-10-24 (×4): 60 mg via ORAL
  Filled 2012-10-21 (×4): qty 1

## 2012-10-21 MED ORDER — ENOXAPARIN SODIUM 60 MG/0.6ML ~~LOC~~ SOLN
1.0000 mg/kg | SUBCUTANEOUS | Status: DC
  Administered 2012-10-21: 55 mg via SUBCUTANEOUS
  Filled 2012-10-21: qty 0.6

## 2012-10-21 MED ORDER — SODIUM CHLORIDE 0.9 % IV SOLN
Freq: Once | INTRAVENOUS | Status: AC
  Administered 2012-10-21: 10:00:00 via INTRAVENOUS

## 2012-10-21 MED ORDER — ALPRAZOLAM 0.5 MG PO TABS
0.5000 mg | ORAL_TABLET | Freq: Every day | ORAL | Status: DC | PRN
  Administered 2012-10-21: 0.5 mg via ORAL
  Filled 2012-10-21 (×2): qty 1

## 2012-10-21 MED ORDER — SODIUM CHLORIDE 0.9 % IV BOLUS (SEPSIS)
500.0000 mL | Freq: Once | INTRAVENOUS | Status: AC
  Administered 2012-10-21: 10:00:00 via INTRAVENOUS

## 2012-10-21 MED ORDER — AMIODARONE HCL 150 MG/3ML IV SOLN
150.0000 mg | Freq: Once | INTRAVENOUS | Status: AC
  Administered 2012-10-21: 150 mg via INTRAVENOUS
  Filled 2012-10-21: qty 3

## 2012-10-21 MED ORDER — AMIODARONE HCL IN DEXTROSE 360-4.14 MG/200ML-% IV SOLN
60.0000 mg/h | INTRAVENOUS | Status: AC
  Administered 2012-10-21: 60 mg/h via INTRAVENOUS
  Filled 2012-10-21: qty 200

## 2012-10-21 NOTE — Consult Note (Signed)
CARDIOLOGY CONSULT NOTE    Patient ID: FELICHIA BRIDENSTINE MRN: [id number] DOB/AGE: [birth year] 78 y.o.  Admit date: 10/21/2012 Referring Physician:  Roderic Palau Primary Physician: Leonides Grills, MD Primary Cardiologist:  New Reason for Consultation: Afib  Principal Problem:  *Atrial fibrillation with RVR Active Problems:  Hypertension  Anemia  Chest pain  CKD (chronic kidney disease) stage 3, GFR 30-59 ml/min   HPI:   78 yo with no previous heart disease.  Having chronic back pain for a month and taking oxycodone q3-6 hrs.  Supposed to see Sherwood Gambler in am Had dysphagia with pill and then had chest pain.  Pain did not go away with water.  She did not note palpitations but taken to ER and found to be in rapid Afib.  No previous chest pain.  Felt fine last night.  History of chronic anemia with multiple previous bowel surgeries from adhesions.  Previously on iron but can only tolerate flinstone mulivitamins now.  Denies melena, BRBR or ulcers.  In ER BP low and history of CRF.  Currently on iv amiodarone.  She feels better with HR around 100 and no chest pain.  Reviewed echo just done and only mild MR with LVH and no RWMAls trivial posterior pericardial effusion.  ECG with rapid afib but no ischemia and POC negative    @ROS @ All other systems reviewed and negative except as noted above  Past Medical History  Diagnosis Date  . Hypertension   . Thyroid condition   . Abdominal adhesions     Family History  Problem Relation Age of Onset  . Anuerysm Father   . Rheum arthritis Sister   . Healthy Sister   . COPD Sister   . Healthy Brother   . Cancer      History   Social History  . Marital Status: Widowed    Spouse Name: N/A    Number of Children: N/A  . Years of Education: 12   Occupational History  . Not on file.   Social History Main Topics  . Smoking status: Former Smoker -- 1.5 packs/day for 20 years    Types: Cigarettes  . Smokeless tobacco: Never Used  . Alcohol  Use: No  . Drug Use: No  . Sexually Active: No   Other Topics Concern  . Not on file   Social History Narrative  . No narrative on file    Past Surgical History  Procedure Date  . Abdominal hysterectomy   . Abdominal surgery   . Colonoscopy   . Upper gastrointestinal endoscopy   . Mastectomy right breast        . [COMPLETED] sodium chloride   Intravenous Once  . sodium chloride   Intravenous STAT  . [COMPLETED] amiodarone  150 mg Intravenous Once  . aspirin EC  81 mg Oral Daily  . [COMPLETED] diltiazem  10 mg Intravenous Once  . DULoxetine  60 mg Oral Daily  . enoxaparin (LOVENOX) injection  1 mg/kg Subcutaneous Q24H  . levothyroxine  50 mcg Oral QAC breakfast  . [COMPLETED] sodium chloride  500 mL Intravenous Once  . [DISCONTINUED] digoxin  0.125 mg Intravenous Once      . 0.9 % NaCl with KCl 20 mEq / L 75 mL/hr at 10/21/12 1230  . amiodarone (NEXTERONE PREMIX) 360 mg/200 mL dextrose 60 mg/hr (10/21/12 1227)   And  . amiodarone (NEXTERONE PREMIX) 360 mg/200 mL dextrose    . [DISCONTINUED] diltiazem (CARDIZEM) infusion Stopped (10/21/12 1038)    Physical Exam:  Blood pressure 89/56, pulse 113, temperature 98.5 F (36.9 C), temperature source Oral, resp. rate 13, height 5\' 4"  (1.626 m), weight 120 lb (54.432 kg), SpO2 99.00%.   Affect appropriate Healthy:  appears stated age some pallor HEENT: normal Neck supple with no adenopathy JVP normal no bruits no thyromegaly Lungs clear with no wheezing and good diaphragmatic motion Heart:  S1/S2 2/6 SEM  murmur, no rub, gallop or click PMI normal Abdomen: benighn, BS positve, no tenderness, no AAA S/P multiple surgeries no bruit.  No HSM or HJR Distal pulses intact with no bruits No edema Neuro non-focal Skin warm and dry No muscular weakness   Labs:   Lab Results  Component Value Date   WBC 8.1 10/21/2012   HGB 9.3* 10/21/2012   HCT 28.8* 10/21/2012   MCV 86.0 10/21/2012   PLT 275 10/21/2012    Lab 10/21/12  0919  NA 140  K 3.8  CL 110  CO2 17*  BUN 35*  CREATININE 1.41*  CALCIUM 9.5  PROT 7.1  BILITOT 0.2*  ALKPHOS 52  ALT 8  AST 12  GLUCOSE 101*   Lab Results  Component Value Date   TROPONINI <0.30 10/21/2012       Radiology: Dg Chest Portable 1 View  10/21/2012  *RADIOLOGY REPORT*  Clinical Data: Chest pain, hypertension, prior right mastectomy  PORTABLE CHEST - 1 VIEW  Comparison: Portable exam 0915 hours compared to 08/26/2011  Findings: Upper-normal size of cardiac silhouette. Mediastinal contours and pulmonary vascularity normal. Lungs clear. Question underlying emphysematous changes. No pleural effusion or pneumothorax. Prior right mastectomy and axillary node dissection. Osseous demineralization.  IMPRESSION: Question emphysematous changes. No acute abnormalities.   Original Report Authenticated By: Lavonia Dana, M.D.     EKG: Rapid afib rate 149 no ischemia   ASSESSMENT AND PLAN:  Afib:  Continue amiodarone.  Start low dose coreg in am 3.125 bid once hydrated.  If Hct stable in am and still in afib start heparin.  Despite anemia her history would suggest that this is not from gi bleeding and she could take coumadin/heparin Hypotension: Etiology not clear dysphagia has cleared with no signs of esophageal injury.  EF normal by echo so hydrate and follow Hct Chest Pain: Likely related to pill getting stuck in esphagus.  R/O ECG no ischemia and echo with no RWMAls.   Anemia:  Check appr labs and guaic stools  Signed: Jenkins Rouge 10/21/2012, 3:22 PM

## 2012-10-21 NOTE — ED Provider Notes (Signed)
History   This chart was scribed for Jane Essex, MD by Jane Andrews . The patient was seen in room APA04/APA04. Patient's care was started at 0852.    CSN: [id number]  Arrival date & time 10/21/12  [id number]   First MD Initiated Contact with Patient 10/21/12 [phone]      Chief Complaint  Patient presents with  . Chest Pain    The history is provided by the patient. No language interpreter was used.  Jane Andrews is a 78 y.o. female who presents to the Emergency Department complaining of sudden onset, moderate mid sternal chest pain that started this morning around 6 am after taking an oxycodone. She believes the pill got lodged in her throat and has had chest pain ever since. She reports associated diaphoresis. She states that her chest pain has improved. She was given aspirin in route by EMS but did not receive nitro due to hypotension. She denies any SOB, dizziness, lightheadedness, nausea, emesis, abdominal pain, headache, difficulty urinating. She states she takes the oxycodone for chronic back pain. She denies any acute changes in her back pain. She denies any h/o kidney or cardiac problems. She denies any h/o a-fib or receiving a stress test.   PCP: Dr. Orson Ape  Past Medical History  Diagnosis Date  . Hypertension   . Thyroid condition   . Abdominal adhesions     Past Surgical History  Procedure Date  . Abdominal hysterectomy   . Abdominal surgery   . Colonoscopy   . Upper gastrointestinal endoscopy   . Mastectomy right breast    Family History  Problem Relation Age of Onset  . Anuerysm Father   . Rheum arthritis Sister   . Healthy Sister   . COPD Sister   . Healthy Brother   . Cancer      History  Substance Use Topics  . Smoking status: Former Smoker -- 1.5 packs/day for 20 years    Types: Cigarettes  . Smokeless tobacco: Never Used  . Alcohol Use: No    OB History    Grav Para Term Preterm Abortions TAB SAB Ect Mult Living                    Review of Systems A complete 10 system review of systems was obtained and all systems are negative except as noted in the HPI and PMH.   Allergies  Codeine  Home Medications   Current Outpatient Rx  Name  Route  Sig  Dispense  Refill  . ALENDRONATE SODIUM 70 MG PO TABS   Oral   Take 70 mg by mouth every 7 (seven) days. Take with a full glass of water on an empty stomach. Saturday         . ALPRAZOLAM 1 MG PO TABS   Oral   Take 0.5 mg by mouth daily as needed. Patient states that when she takes it she takes 1/2 of a 1 mg (anxiety/nerves)         . VITAMIN D PO   Oral   Take 1,200 mg by mouth daily.         Marland Kitchen VITAMIN B-12 IJ   Injection   Inject as directed every 30 (thirty) days.         . DULOXETINE HCL 60 MG PO CPEP   Oral   Take 60 mg by mouth daily.         Marland Kitchen GLUCOSAMINE-CHONDROITIN 500-400 MG PO TABS   Oral  Take 1 tablet by mouth daily.         . ACIDOPHILUS 100 MG PO CAPS   Oral   Take by mouth daily.         Marland Kitchen LEVOTHYROXINE SODIUM 100 MCG PO TABS   Oral   Take by mouth daily. Patient takes 50 mcg daily         . ONE-DAILY MULTI VITAMINS PO TABS   Oral   Take 1 tablet by mouth daily.         Marland Kitchen OLMESARTAN MEDOXOMIL-HCTZ 20-12.5 MG PO TABS   Oral   Take 1 tablet by mouth daily.           Marland Kitchen FISH OIL 1000 MG PO CAPS   Oral   Take by mouth daily.         . OXYCODONE-ACETAMINOPHEN 5-325 MG PO TABS   Oral   Take 1 tablet by mouth every 6 (six) hours as needed for pain.   20 tablet   0     BP 104/62  Pulse 74  Temp 98.2 F (36.8 C) (Oral)  Resp 20  Ht 5\' 4"  (1.626 m)  Wt 120 lb (54.432 kg)  BMI 20.60 kg/m2  SpO2 95%  Physical Exam  Nursing note and vitals reviewed. Constitutional: She is oriented to person, place, and time. She appears well-developed and well-nourished. No distress.  HENT:  Head: Normocephalic and atraumatic.  Right Ear: External ear normal.  Left Ear: External ear normal.  Nose: Nose normal.   Mouth/Throat: Oropharynx is clear and moist. No oropharyngeal exudate.  Eyes: Conjunctivae normal and EOM are normal. Pupils are equal, round, and reactive to light.  Neck: Normal range of motion. Neck supple. No tracheal deviation present.  Cardiovascular: Normal heart sounds.  An irregular rhythm present. Tachycardia present.   Pulmonary/Chest: Effort normal. No respiratory distress. She has no wheezes.  Abdominal: Soft. Bowel sounds are normal. She exhibits no distension. There is no tenderness.  Musculoskeletal: Normal range of motion. She exhibits no edema.  Neurological: She is alert and oriented to person, place, and time. No cranial nerve deficit.  Skin: Skin is warm and dry.  Psychiatric: She has a normal mood and affect. Her behavior is normal.    ED Course  Procedures (including critical care time)  DIAGNOSTIC STUDIES: Oxygen Saturation is 95% on 2 L [place], adequate by my interpretation.    COORDINATION OF CARE:  09:10-Discussed planned course of treatment with the patient including Cardizem, IV fluids, a chest x-ray, and blood work, who is agreeable at this time.   09:15-Medication Orders: Diltiazem (Cardizem) injection SOLN 10 mg-once; Diltiazem (Cardizem) 100 mg in dextrose 5% 100 mL infusion-titrated; Sodium chloride 0.9% bolus 500 mL-once; 0.9% sodium chloride infusion-once  09:52-Preformed ultrasound of pt's heart at bedside. No effusions or right heart strain visualized.    Labs Reviewed  CBC WITH DIFFERENTIAL - Abnormal; Notable for the following:    RBC 3.35 (*)     Hemoglobin 9.3 (*)     HCT 28.8 (*)     Neutrophils Relative 80 (*)     Lymphocytes Relative 10 (*)     All other components within normal limits  COMPREHENSIVE METABOLIC PANEL - Abnormal; Notable for the following:    CO2 17 (*)     Glucose, Bld 101 (*)     BUN 35 (*)     Creatinine, Ser 1.41 (*)     Albumin 3.2 (*)     Total Bilirubin 0.2 (*)  GFR calc non Af Amer 33 (*)     GFR calc Af  Amer 38 (*)     All other components within normal limits  PROTIME-INR  TROPONIN I   Dg Chest Portable 1 View  10/21/2012  *RADIOLOGY REPORT*  Clinical Data: Chest pain, hypertension, prior right mastectomy  PORTABLE CHEST - 1 VIEW  Comparison: Portable exam 0915 hours compared to 08/26/2011  Findings: Upper-normal size of cardiac silhouette. Mediastinal contours and pulmonary vascularity normal. Lungs clear. Question underlying emphysematous changes. No pleural effusion or pneumothorax. Prior right mastectomy and axillary node dissection. Osseous demineralization.  IMPRESSION: Question emphysematous changes. No acute abnormalities.   Original Report Authenticated By: Lavonia Dana, M.D.      1. Atrial fibrillation with RVR       MDM  2.5 hours of constant chest pain after taking hydrocodone. Denies palpitations, shortness of breath, nausea or vomiting. No previous cardiac history.  Atrial fibrillation with RVR. Hypotension limits use of Cardizem. Will bolus amiodarone. Patient remains asymptomatic. Renal insufficiency prevents digoxin use.  Rate decreased to 120s with blood pressure in the 90s. Patient denies chest pain. We'll admit for further treatment to Dr. Roderic Palau.   Date: 10/21/2012  Rate: 149  Rhythm: atrial fibrillation  QRS Axis: normal  Intervals: normal  ST/T Wave abnormalities: nonspecific ST/T changes  Conduction Disutrbances:none  Narrative Interpretation:   Old EKG Reviewed: none available    EMERGENCY DEPARTMENT [name] CARDIAC EXAM "Study: Limited Ultrasound of the heart and pericardium"  INDICATIONS:Hypotension Multiple views of the heart and pericardium are obtained with a multi-frequency probe.  PERFORMED YE:[id number]  IMAGES ARCHIVED?: No  FINDINGS: No pericardial effusion, Normal contractility and Tamponade physiology absent  LIMITATIONS:  Emergent procedure  VIEWS USED: Subcostal 4 chamber and Apical 4 chamber   INTERPRETATION: Cardiac activity present,  Pericardial effusioin absent and Cardiac tamponade absent  Andrews:  No effusion or tamponade  CRITICAL CARE Performed by: Jane Andrews   Total critical care time: 30  Critical care time was exclusive of separately billable procedures and treating other patients.  Critical care was necessary to treat or prevent imminent or life-threatening deterioration.  Critical care was time spent personally by me on the following activities: development of treatment plan with patient and/or surrogate as well as nursing, discussions with consultants, evaluation of patient's response to treatment, examination of patient, obtaining history from patient or surrogate, ordering and performing treatments and interventions, ordering and review of laboratory studies, ordering and review of radiographic studies, pulse oximetry and re-evaluation of patient's condition.  I personally performed the services described in this documentation, which was scribed in my presence.  The recorded information has been reviewed and considered.       Jane Essex, MD 10/21/12 1024

## 2012-10-21 NOTE — Plan of Care (Signed)
Problem: Phase I Progression Outcomes Goal: Pain controlled with appropriate interventions Outcome: Completed/Met Date Met:  10/21/12 Complaint of back pain only with medication very helpful  Problem: Discharge Progression Outcomes Goal: Barriers To Progression Addressed/Resolved Outcome: Completed/Met Date Met:  10/21/12 Patient concerned about missing appointment with neurosurgeon on Friday, the 8th of November.  Appointment rescheduled for the 16th of November.

## 2012-10-21 NOTE — ED Notes (Signed)
Pt c/o chest pain after taking hydrocodone this am. Pt was given aspirin enroute, but no nitro due to hypotension.

## 2012-10-21 NOTE — H&P (Signed)
[facility] History and Physical  Jane Andrews [id number] DOB: 15-Oct-1925 DOA: 10/21/2012  Referring physician: Harlow Mares, MD PCP: Leonides Grills, MD  Specialists: Neurosurgeon: Dr. Sherwood Gambler  Chief Complaint: chest pain  HPI: Jane Andrews is a 78 y.o. female with past medical history of hypertension, hypothyroidism, chronic back pain. Patient was in her usual state of health when she had taken oxycodone this morning for pain. She describes experiencing substernal chest pain after taking the medication. She felt that her pill may have gotten lodged in her throat which resulted in her pain. She had associated diaphoresis, she denies any shortness of breath, no nausea or vomiting, no lightheadedness. Her symptoms persisted she called EMS. She was brought to the hospital and was given aspirin en route. She did not receive any nitroglycerin due to hypotension. She's not had any prior cardiac history or cardiac workup. In the emergency room she was found to be in rapid atrial fibrillation with a heart rate in the high 140s. Blood pressure was also noted to be low in the 80s. Patient otherwise feels better now her chest pain has resolved. The hospitalist service has been requested to assist with management.  Review of Systems: Pertinent positives as per history of present illness, otherwise negative  Past Medical History  Diagnosis Date  . Hypertension   . Thyroid condition   . Abdominal adhesions    Past Surgical History  Procedure Date  . Abdominal hysterectomy   . Abdominal surgery   . Colonoscopy   . Upper gastrointestinal endoscopy   . Mastectomy right breast   Social History:  reports that she has quit smoking. Her smoking use included Cigarettes. She has a 30 pack-year smoking history. She has never used smokeless tobacco. She reports that she does not drink alcohol or use illicit drugs. Patient lives independently, she and pelvis without any assistive  devices.  Allergies  Allergen Reactions  . Codeine Nausea And Vomiting    Family History  Problem Relation Age of Onset  . Anuerysm Father   . Rheum arthritis Sister   . Healthy Sister   . COPD Sister   . Healthy Brother   . Cancer      Prior to Admission medications   Medication Sig Start Date End Date Taking? Authorizing Provider  alendronate (FOSAMAX) 70 MG tablet Take 70 mg by mouth every 7 (seven) days. Take with a full glass of water on an empty stomach. Saturday   Yes Historical Provider, MD  ALPRAZolam Duanne Moron) 1 MG tablet Take 0.5 mg by mouth daily as needed. Patient states that when she takes it she takes 1/2 of a 1 mg (anxiety/nerves)   Yes Historical Provider, MD  Cholecalciferol (VITAMIN D PO) Take 1,200 mg by mouth daily.   Yes Historical Provider, MD  Cyanocobalamin (VITAMIN B-12 IJ) Inject as directed every 30 (thirty) days.   Yes Historical Provider, MD  DULoxetine (CYMBALTA) 60 MG capsule Take 60 mg by mouth daily.   Yes Historical Provider, MD  glucosamine-chondroitin 500-400 MG tablet Take 1 tablet by mouth daily.   Yes Historical Provider, MD  Lactobacillus (ACIDOPHILUS) 100 MG CAPS Take by mouth daily.   Yes Historical Provider, MD  levothyroxine (SYNTHROID, LEVOTHROID) 100 MCG tablet Take by mouth daily. Patient takes 50 mcg daily   Yes Historical Provider, MD  Multiple Vitamin (MULTIVITAMIN) tablet Take 1 tablet by mouth daily.   Yes Historical Provider, MD  olmesartan-hydrochlorothiazide (BENICAR HCT) 20-12.5 MG per tablet Take 1 tablet by mouth  daily.     Yes Historical Provider, MD  Omega-3 Fatty Acids (FISH OIL) 1000 MG CAPS Take by mouth daily.   Yes Historical Provider, MD  oxyCODONE-acetaminophen (PERCOCET/ROXICET) 5-325 MG per tablet Take 1 tablet by mouth every 6 (six) hours as needed for pain. 09/18/12  Yes Mirna Mires, MD   Physical Exam: Filed Vitals:   10/21/12 0845 10/21/12 1026  BP: 104/62 93/56  Pulse: 74 134  Temp: 98.2 F (36.8 C)     TempSrc: Oral   Resp: 20 20  Height: 5\' 4"  (1.626 m)   Weight: 54.432 kg (120 lb)   SpO2: 95% 98%     General:  No acute distress, lying in bed, appears to be comfortable  Eyes: Pupils are equal round react to light  ENT: Mucous membranes are moist  Neck: Supple  Cardiovascular: Tachycardic, S1, S2, irregular, no pedal edema bilaterally  Respiratory: Clear to auscultation bilaterally  Abdomen: Soft, nontender, nondistended, bowel sounds are active  Skin: Normal  Musculoskeletal: Deferred  Psychiatric: Normal affect, cooperative with exam  Neurologic: Grossly intact, nonfocal  Labs on Admission:  Basic Metabolic Panel:  Lab [id number] 0919  NA 140  K 3.8  CL 110  CO2 17*  GLUCOSE 101*  BUN 35*  CREATININE 1.41*  CALCIUM 9.5  MG --  PHOS --   Liver Function Tests:  Lab 10/21/12 0919  AST 12  ALT 8  ALKPHOS 52  BILITOT 0.2*  PROT 7.1  ALBUMIN 3.2*   No results found for this basename: LIPASE:5,AMYLASE:5 in the last 168 hours No results found for this basename: AMMONIA:5 in the last 168 hours CBC:  Lab 10/21/12 0919  WBC 8.1  NEUTROABS 6.5  HGB 9.3*  HCT 28.8*  MCV 86.0  PLT 275   Cardiac Enzymes:  Lab 10/21/12 0919  CKTOTAL --  CKMB --  CKMBINDEX --  TROPONINI <0.30    BNP (last 3 results) No results found for this basename: PROBNP:3 in the last 8760 hours CBG: No results found for this basename: GLUCAP:5 in the last 168 hours  Radiological Exams on Admission: Dg Chest Portable 1 View  10/21/2012  *RADIOLOGY REPORT*  Clinical Data: Chest pain, hypertension, prior right mastectomy  PORTABLE CHEST - 1 VIEW  Comparison: Portable exam 0915 hours compared to 08/26/2011  Findings: Upper-normal size of cardiac silhouette. Mediastinal contours and pulmonary vascularity normal. Lungs clear. Question underlying emphysematous changes. No pleural effusion or pneumothorax. Prior right mastectomy and axillary node dissection. Osseous demineralization.   IMPRESSION: Question emphysematous changes. No acute abnormalities.   Original Report Authenticated By: Lavonia Dana, M.D.     EKG: Independently reviewed. Rapid atrial fibrillation  Assessment/Plan Principal Problem:  *Atrial fibrillation with RVR Active Problems:  Hypertension  Anemia  Chest pain  CKD (chronic kidney disease) stage 3, GFR 30-59 ml/min   1. Atrial fibrillation with ventricular response. Patient was initially placed on a Cardizem infusion, but this was continued due to hypotension. Hypotension we'll also limit use of beta blockers. The patient has chronic kidney disease and an elevated creatinine. This limits the use of digoxin. She was given a bolus of amiodarone in the emergency room which appears to be improving her heart rate. During my exam, her heart rate improved to the 100-110 range. She'll be continued on a amiodarone infusion. We'll request a cardiology consultation to assist with further management. We'll order 2-D echocardiogram, cardiac enzymes, TSH. Her CHADS2 score is 2. We will start her on therapeutic Lovenox. On discharge  she will likely need Coumadin versus Xarelto. 2. Hypertension. Currently hypotensive. Patient will receive IV fluids. We will hold her antihypertensives for now. 3. Hypothyroidism. Continue levothyroxine. Check TSH. 4. Anemia. Likely due to chronic kidney disease. Hemoglobin is currently at baseline. She does not have any evidence of bleeding. 5. Chest pain. Possibly related to atrial fibrillation versus pill esophagitis. We'll cycle cardiac markers. 6. Chronic kidney disease. Creatinine appears to be near baseline. She does have a low serum bicarbonate and an anion gap of 13. We'll check lactate, although she does not appear toxic.  This may be related to her kidney disease. 7. Chronic back pain. Patient was scheduled to see Dr. Sherwood Gambler tomorrow. This will likely need to be rescheduled    Code Status: Full code Family Communication:  Discussed with patient, no family at the bedside Disposition Plan: Discharge home once heart rate improved.  Time spent: 45 minutes  [facility] Pager [phone]  If 7PM-7AM, please contact night-coverage [email or web address] Password [email or web address] 10/21/2012, 11:05 AM

## 2012-10-21 NOTE — Progress Notes (Signed)
*  PRELIMINARY RESULTS* Echocardiogram 2D Echocardiogram has been performed.  Tera Partridge 10/21/2012, 1:56 PM

## 2012-10-21 NOTE — Progress Notes (Signed)
UR Chart Review Completed  

## 2012-10-22 DIAGNOSIS — D649 Anemia, unspecified: Secondary | ICD-10-CM

## 2012-10-22 DIAGNOSIS — R079 Chest pain, unspecified: Secondary | ICD-10-CM | POA: Diagnosis not present

## 2012-10-22 DIAGNOSIS — I129 Hypertensive chronic kidney disease with stage 1 through stage 4 chronic kidney disease, or unspecified chronic kidney disease: Secondary | ICD-10-CM | POA: Diagnosis not present

## 2012-10-22 DIAGNOSIS — I4891 Unspecified atrial fibrillation: Secondary | ICD-10-CM | POA: Diagnosis not present

## 2012-10-22 LAB — CBC
HCT: 22 % — ABNORMAL LOW (ref 36.0–46.0)
MCV: 86.6 fL (ref 78.0–100.0)
Platelets: 221 10*3/uL (ref 150–400)
RBC: 2.54 MIL/uL — ABNORMAL LOW (ref 3.87–5.11)
WBC: 6.4 10*3/uL (ref 4.0–10.5)

## 2012-10-22 LAB — BASIC METABOLIC PANEL
CO2: 17 mEq/L — ABNORMAL LOW (ref 19–32)
Calcium: 8.3 mg/dL — ABNORMAL LOW (ref 8.4–10.5)
Chloride: 114 mEq/L — ABNORMAL HIGH (ref 96–112)
Sodium: 140 mEq/L (ref 135–145)

## 2012-10-22 LAB — TSH: TSH: 1.078 u[IU]/mL (ref 0.350–4.500)

## 2012-10-22 LAB — IRON AND TIBC
Iron: 22 ug/dL — ABNORMAL LOW (ref 42–135)
UIBC: 207 ug/dL (ref 125–400)

## 2012-10-22 LAB — OCCULT BLOOD X 1 CARD TO LAB, STOOL: Fecal Occult Bld: NEGATIVE

## 2012-10-22 LAB — RETICULOCYTES: Retic Count, Absolute: 61.6 10*3/uL (ref 19.0–186.0)

## 2012-10-22 MED ORDER — AMIODARONE HCL 200 MG PO TABS
200.0000 mg | ORAL_TABLET | Freq: Two times a day (BID) | ORAL | Status: DC
  Administered 2012-10-22 – 2012-10-24 (×5): 200 mg via ORAL
  Filled 2012-10-22 (×5): qty 1

## 2012-10-22 MED ORDER — POLYETHYLENE GLYCOL 3350 17 G PO PACK
17.0000 g | PACK | Freq: Every day | ORAL | Status: DC
  Filled 2012-10-22: qty 1

## 2012-10-22 MED ORDER — STERILE WATER FOR INJECTION IV SOLN
INTRAVENOUS | Status: DC
  Administered 2012-10-22 – 2012-10-23 (×2): via INTRAVENOUS
  Filled 2012-10-22 (×6): qty 9.7

## 2012-10-22 MED ORDER — ALPRAZOLAM 0.5 MG PO TABS
0.5000 mg | ORAL_TABLET | Freq: Once | ORAL | Status: AC
  Administered 2012-10-22: 0.5 mg via ORAL
  Filled 2012-10-22: qty 1

## 2012-10-22 MED ORDER — ALPRAZOLAM 0.5 MG PO TABS
0.5000 mg | ORAL_TABLET | Freq: Two times a day (BID) | ORAL | Status: DC | PRN
  Administered 2012-10-22 – 2012-10-23 (×4): 0.5 mg via ORAL
  Filled 2012-10-22 (×4): qty 1

## 2012-10-22 NOTE — Progress Notes (Signed)
[facility]             Progress Note   Subjective: Patient denies any shortness of breath or chest pain this morning.  She feels tired and irritable. She has converted to sinus rhythm. She denies any melena, hematochezia or any other signs of bleeding.  Objective: Vital signs in last 24 hours: Temp:  [98.3 F (36.8 C)-98.7 F (37.1 C)] 98.7 F (37.1 C) (11/08 0845) Pulse Rate:  [61-134] 82  (11/08 0800) Resp:  [12-21] 21  (11/08 0800) BP: (71-119)/(29-74) 99/36 mmHg (11/08 0800) SpO2:  [92 %-100 %] 99 % (11/08 0800) Weight:  [54.432 kg (120 lb)-57.6 kg (126 lb 15.8 oz)] 57.6 kg (126 lb 15.8 oz) (11/08 0500) Weight change:  Last BM Date: 10/22/12  Intake/Output from previous day: 11/07 0701 - 11/08 0700 In: 2691.8 [P.O.:960; I.V.:1731.8] Out: 1150 [Urine:1150] Total I/O In: 390 [P.O.:240; I.V.:150] Out: -    Physical Exam: General: Alert, awake, oriented x3, in no acute distress. HEENT: No bruits, no goiter. Heart: Regular rate and rhythm, without murmurs, rubs, gallops. Lungs: Clear to auscultation bilaterally. Abdomen: Soft, nontender, nondistended, positive bowel sounds. Extremities: No clubbing cyanosis or edema with positive pedal pulses. Neuro: Grossly intact, nonfocal.    Lab Results: Basic Metabolic Panel:  Basename 10/22/12 0441 10/21/12 0919  NA 140 140  K 4.0 3.8  CL 114* 110  CO2 17* 17*  GLUCOSE 92 101*  BUN 29* 35*  CREATININE 1.54* 1.41*  CALCIUM 8.3* 9.5  MG -- --  PHOS -- --   Liver Function Tests:  Basename 10/21/12 0919  AST 12  ALT 8  ALKPHOS 52  BILITOT 0.2*  PROT 7.1  ALBUMIN 3.2*   No results found for this basename: LIPASE:2,AMYLASE:2 in the last 72 hours No results found for this basename: AMMONIA:2 in the last 72 hours CBC:  Basename 10/22/12 0441 10/21/12 0919  WBC 6.4 8.1  NEUTROABS -- 6.5  HGB 7.1* 9.3*  HCT 22.0* 28.8*  MCV 86.6 86.0  PLT 221 275   Cardiac Enzymes:  Basename 10/21/12 2346  10/21/12 1746 10/21/12 1209  CKTOTAL -- -- --  CKMB -- -- --  CKMBINDEX -- -- --  TROPONINI <0.30 <0.30 <0.30   BNP: No results found for this basename: PROBNP:3 in the last 72 hours D-Dimer: No results found for this basename: DDIMER:2 in the last 72 hours CBG: No results found for this basename: GLUCAP:6 in the last 72 hours Hemoglobin A1C:  Basename 10/21/12 1209  HGBA1C 5.1   Fasting Lipid Panel: No results found for this basename: CHOL,HDL,LDLCALC,TRIG,CHOLHDL,LDLDIRECT in the last 72 hours Thyroid Function Tests:  Basename 10/21/12 1209  TSH 1.078  T4TOTAL --  FREET4 --  T3FREE --  THYROIDAB --   Anemia Panel: No results found for this basename: VITAMINB12,FOLATE,FERRITIN,TIBC,IRON,RETICCTPCT in the last 72 hours Coagulation:  Basename 10/21/12 0919  LABPROT 13.6  INR 1.05   Urine Drug Screen: Drugs of Abuse  No results found for this basename: labopia, cocainscrnur, labbenz, amphetmu, thcu, labbarb    Alcohol Level: No results found for this basename: ETH:2 in the last 72 hours Urinalysis: No results found for this basename: COLORURINE:2,APPERANCEUR:2,LABSPEC:2,PHURINE:2,GLUCOSEU:2,HGBUR:2,BILIRUBINUR:2,KETONESUR:2,PROTEINUR:2,UROBILINOGEN:2,NITRITE:2,LEUKOCYTESUR:2 in the last 72 hours  Recent Results (from the past 240 hour(s))  MRSA PCR SCREENING     Status: Normal   Collection Time   10/21/12 12:53 PM      Component Value Range Status Comment   MRSA by PCR NEGATIVE  NEGATIVE Final     Studies/Results:  Dg Chest Portable 1 View  10/21/2012  *RADIOLOGY REPORT*  Clinical Data: Chest pain, hypertension, prior right mastectomy  PORTABLE CHEST - 1 VIEW  Comparison: Portable exam 0915 hours compared to 08/26/2011  Findings: Upper-normal size of cardiac silhouette. Mediastinal contours and pulmonary vascularity normal. Lungs clear. Question underlying emphysematous changes. No pleural effusion or pneumothorax. Prior right mastectomy and axillary node  dissection. Osseous demineralization.  IMPRESSION: Question emphysematous changes. No acute abnormalities.   Original Report Authenticated By: Lavonia Dana, M.D.     Medications: Scheduled Meds:   . [COMPLETED] sodium chloride   Intravenous Once  . [COMPLETED] sodium chloride   Intravenous STAT  . [COMPLETED] ALPRAZolam  0.5 mg Oral Once  . [COMPLETED] amiodarone  150 mg Intravenous Once  . amiodarone  200 mg Oral BID  . aspirin EC  81 mg Oral Daily  . [COMPLETED] diltiazem  10 mg Intravenous Once  . DULoxetine  60 mg Oral Daily  . enoxaparin (LOVENOX) injection  1 mg/kg Subcutaneous Q24H  . levothyroxine  50 mcg Oral QAC breakfast  . [COMPLETED] sodium chloride  500 mL Intravenous Once  . [DISCONTINUED] digoxin  0.125 mg Intravenous Once   Continuous Infusions:   . 0.9 % NaCl with KCl 20 mEq / L 75 mL/hr at 10/22/12 0600  . [EXPIRED] amiodarone (NEXTERONE PREMIX) 360 mg/200 mL dextrose 60 mg/hr (10/21/12 1227)  . [DISCONTINUED] amiodarone (NEXTERONE PREMIX) 360 mg/200 mL dextrose Stopped (10/22/12 0350)  . [DISCONTINUED] diltiazem (CARDIZEM) infusion Stopped (10/21/12 1038)   PRN Meds:.acetaminophen, acetaminophen, ALPRAZolam, ondansetron (ZOFRAN) IV, ondansetron, oxyCODONE-acetaminophen, [DISCONTINUED] ondansetron (ZOFRAN) IV  Assessment/Plan:  Principal Problem:  *Atrial fibrillation with RVR Active Problems:  Hypertension  Anemia  Chest pain  CKD (chronic kidney disease) stage 3, GFR 30-59 ml/min  1. Atrial fibrillation with rapid ventricular response. Patient was initially placed on a Cardizem infusion, but this was continued due to hypotension. Hypotension will also limit use of beta blockers. The patient has chronic kidney disease and an elevated creatinine. This limits the use of digoxin. She was started on an amiodarone infusion and has converted to sinus rhythm.  Amio drip was discontinued last night due to hypotension.  She is currently on oral amiodarone. She is  currently receiving Lovenox since her CHADS2 score is 2.  She will need longer term management on discharge, Coumadin versus Xarelto. Will hold anticoagulation for now due to declining hemoglobin 2. Hypertension. Currently hypotensive. Patient will receiving IV fluids. We will hold her antihypertensives for now. Possibly related to anemia.  Does not appear to be toxic. 3. Hypothyroidism. Continue levothyroxine. TSH normal. 4. Anemia. Patient has had further decline in hemoglobin without evidence of gross bleeding.  ?hemodilution. Agree with transfusing 1 unit PRBC. Anemia panel and stool occult blood have been ordered.Will hold lovenox for now due to concerns of bleeding.  5. Chest pain. Possibly related to atrial fibrillation versus pill esophagitis.Cardiac markers and echo unremarkable.  Chest pain has resolved. 6. Chronic kidney disease. Creatinine appears to be near baseline. She does have a low serum bicarbonate and an anion gap of 9. Lactate normal.  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 Will provide bicarbonate supplementation. 7. Chronic back pain. Patient was scheduled to see Dr. Sherwood Gambler on 11/8. This has been rescheduled.  Time spent coordinating care: 67mins   LOS: 1 day   Hadyn Azer [facility] Pager: [phone] 10/22/2012, 9:38 AM

## 2012-10-22 NOTE — Progress Notes (Addendum)
SUBJECTIVE: Continues complaints of back pain, but has no further complaints of chest discomfort or palpitations.  LABS: Basic Metabolic Panel:  Basename 10/22/12 0441 10/21/12 0919  NA 140 140  K 4.0 3.8  CL 114* 110  CO2 17* 17*  GLUCOSE 92 101*  BUN 29* 35*  CREATININE 1.54* 1.41*  CALCIUM 8.3* 9.5  MG -- --  PHOS -- --   Liver Function Tests:  [facility] 10/21/12 0919  AST 12  ALT 8  ALKPHOS 52  BILITOT 0.2*  PROT 7.1  ALBUMIN 3.2*   CBC:  Basename 10/22/12 0441 10/21/12 0919  WBC 6.4 8.1  NEUTROABS -- 6.5  HGB 7.1* 9.3*  HCT 22.0* 28.8*  MCV 86.6 86.0  PLT 221 275   Cardiac Enzymes:  Basename 10/21/12 2346 10/21/12 1746 10/21/12 1209  CKTOTAL -- -- --  CKMB -- -- --  CKMBINDEX -- -- --  TROPONINI <0.30 <0.30 <0.30     Basename 10/21/12 1209  HGBA1C 5.1   Thyroid Function Tests:  Basename 10/21/12 1209  TSH 1.078  T4TOTAL --  T3FREE --  THYROIDAB --    RADIOLOGY: Dg Chest Portable 1 View  10/21/2012  *RADIOLOGY REPORT*  Clinical Data: Chest pain, hypertension, prior right mastectomy  PORTABLE CHEST - 1 VIEW  Comparison: Portable exam 0915 hours compared to 08/26/2011  Findings: Upper-normal size of cardiac silhouette. Mediastinal contours and pulmonary vascularity normal. Lungs clear. Question underlying emphysematous changes. No pleural effusion or pneumothorax. Prior right mastectomy and axillary node dissection. Osseous demineralization.  IMPRESSION: Question emphysematous changes. No acute abnormalities.   Original Report Authenticated By: Lavonia Dana, M.D.    Echocardiogram 10/22/2012 Left ventricle: The cavity size was normal. Wall thickness was increased in a pattern of mild LVH. There was mild focal basal hypertrophy of the septum. Systolic function was normal. The estimated ejection fraction was in the range of 55% to 60%. - Mitral valve: Mild regurgitation. - Left atrium: The atrium was mildly dilated. - Atrial septum: No defect or  patent foramen ovale was identified. - Pulmonary arteries: PA peak pressure: 20mm Hg (S). - Pericardium, extracardiac: A trivial pericardial effusion was identified posterior to the heart.   PHYSICAL EXAM BP 99/36  Pulse 82  Temp 98.3 F (36.8 C) (Oral)  Resp 21  Ht 5\' 4"  (1.626 m)  Wt 126 lb 15.8 oz (57.6 kg)  BMI 21.80 kg/m2  SpO2 99% General: Well developed, well nourished, in no acute distress Head: Eyes PERRLA, No xanthomas.   Normal cephalic and atramatic  Lungs: Clear bilaterally to auscultation and percussion. Heart: HRRR S1 S2, No MRG .  Pulses are 2+ & equal.            No carotid bruit. No JVD.  No abdominal bruits. No femoral bruits. Abdomen: Bowel sounds are positive, abdomen soft and non-tender without masses or                  Hernia's noted. Msk:  Back mild kyphosis, pain with movement, normal gait. Normal strength and tone for age. Extremities: No clubbing, cyanosis or edema.  DP +1 Neuro: Alert and oriented X 3. Psych:  Good affect, responds appropriately  TELEMETRY: Reviewed telemetry pt in NSR rates in the 80's.  ASSESSMENT AND PLAN:  1. Atrial fibrillation with RVR: She appears to have converted to NSR this am during my assessment. She received one bolus of amiodarone and received loading doses, unable to take diltiazem secondary to hypotension, or dig secondary to renal insufficiency.  Likely caused by combination of anemia, acute chest pain in the setting of pill lodgment in esophagus. She will be continued on low dose amiodarone po for now 200 mg BID. Continue LMWH. Coreg 3.125 was recommended by Dr. Johnsie Cancel when BP tolerates after hydration. She remains soft in BP, with systolic's in the [id number].    2. Anemia: Likely caused by renal insufficiency. She has dropped her Hgb from 9.3 to 7.1 on this mornings labs. Will type and cross for one unit.  Anemia profile will be ordered if not already completed.   3. Hypertension: Home medications included Benicar/HCTZ  20/12.5 daily. This has not been restarted at this time due to hypotension. This may be related to anemia. Will follow.  4. Chronic Back Pain: Due to see Dr. Sherwood Gambler this month for evaluation. Continues one pain management.  Phill Myron. Purcell Nails NP [facility] 10/22/2012, 8:09 AM  Attending note:  Patient seen and examined. She has converted to sinus rhythm, is now on oral amiodarone. Relatively low blood pressure as well as renal insufficiency limit the choice of medications going forward. Importantly, she has also had continued drop in hemoglobin, down from 9.3 to 7.1, she has been on Lovenox. I would defer placing her on an oral anticoagulant until the etiology of her anemia is better understood. She may need GI evaluation to assess for possible GI source, would at least guaiac stools.  Satira Sark, M.D., F.A.C.C.

## 2012-10-22 NOTE — Care Management Note (Unsigned)
    Page 1 of 1   10/22/2012     3:12:14 PM   CARE MANAGEMENT NOTE 10/22/2012  Patient:  Jane Andrews, Jane Andrews   Account Number:  [id number]  Date Initiated:  10/22/2012  Documentation initiated by:  Vladimir Creeks  Subjective/Objective Assessment:   Admitted with A FIB with RVR. She is fairly independent at home alone, with family members who assist  when needed. She states she will be returning home at D/C, but agrees to a [facility] RN visiting. May need labs frequently, though is probably     Action/Plan:   not going home on Lovenox, nor coumadin. May need H/H checks. Is having transfusion today, due to 2 pt drop in HGB   Anticipated DC Date:  10/22/2012   Anticipated DC Plan:  [place]  CM consult      [facility] Choice  HOME HEALTH   Choice offered to / List presented to:          [facility] arranged  HH-1 RN  [place] agency  [place]   Status of service:  In process, will continue to follow Medicare Important Message given?   (If response is "NO", the following Medicare IM given date fields will be blank) Date Medicare IM given:   Date Additional Medicare IM given:    Discharge Disposition:    Per UR Regulation:  Reviewed for med. necessity/level of care/duration of stay  If discussed at [place] of Stay Meetings, dates discussed:    Comments:  10/22/12

## 2012-10-23 DIAGNOSIS — R079 Chest pain, unspecified: Secondary | ICD-10-CM | POA: Diagnosis not present

## 2012-10-23 DIAGNOSIS — D649 Anemia, unspecified: Secondary | ICD-10-CM | POA: Diagnosis not present

## 2012-10-23 DIAGNOSIS — I4891 Unspecified atrial fibrillation: Secondary | ICD-10-CM | POA: Diagnosis not present

## 2012-10-23 LAB — BASIC METABOLIC PANEL
Calcium: 8.5 mg/dL (ref 8.4–10.5)
GFR calc Af Amer: 44 mL/min — ABNORMAL LOW (ref >90)
GFR calc non Af Amer: 38 mL/min — ABNORMAL LOW (ref >90)
Potassium: 3.3 mEq/L — ABNORMAL LOW (ref 3.5–5.1)
Sodium: 145 mEq/L (ref 135–145)

## 2012-10-23 LAB — CBC WITH DIFFERENTIAL/PLATELET
Basophils Absolute: 0 10*3/uL (ref 0.0–0.1)
Basophils Relative: 0 % (ref 0–1)
Eosinophils Absolute: 0.3 10*3/uL (ref 0.0–0.7)
Eosinophils Relative: 5 % (ref 0–5)
Lymphs Abs: 1.2 10*3/uL (ref 0.7–4.0)
MCH: 27.8 pg (ref 26.0–34.0)
MCV: 86.1 fL (ref 78.0–100.0)
Neutrophils Relative %: 62 % (ref 43–77)
Platelets: 225 10*3/uL (ref 150–400)
RBC: 2.81 MIL/uL — ABNORMAL LOW (ref 3.87–5.11)
RDW: 13.2 % (ref 11.5–15.5)
WBC: 6.4 10*3/uL (ref 4.0–10.5)

## 2012-10-23 MED ORDER — PANTOPRAZOLE SODIUM 40 MG PO TBEC
40.0000 mg | DELAYED_RELEASE_TABLET | Freq: Two times a day (BID) | ORAL | Status: DC
  Administered 2012-10-23 – 2012-10-24 (×2): 40 mg via ORAL
  Filled 2012-10-23 (×2): qty 1

## 2012-10-23 MED ORDER — SODIUM BICARBONATE 650 MG PO TABS
650.0000 mg | ORAL_TABLET | Freq: Three times a day (TID) | ORAL | Status: DC
  Administered 2012-10-23 – 2012-10-24 (×4): 650 mg via ORAL
  Filled 2012-10-23 (×4): qty 1

## 2012-10-23 MED ORDER — SODIUM CHLORIDE 0.9 % IJ SOLN
INTRAMUSCULAR | Status: AC
  Administered 2012-10-23: 3 mL
  Filled 2012-10-23: qty 3

## 2012-10-23 MED ORDER — ALPRAZOLAM 0.5 MG PO TABS
0.5000 mg | ORAL_TABLET | Freq: Once | ORAL | Status: AC
  Administered 2012-10-23: 0.5 mg via ORAL
  Filled 2012-10-23: qty 1

## 2012-10-23 MED ORDER — SODIUM CHLORIDE 0.9 % IJ SOLN
INTRAMUSCULAR | Status: AC
  Filled 2012-10-23: qty 3

## 2012-10-23 MED ORDER — PANTOPRAZOLE SODIUM 40 MG PO TBEC: 40.0000 mg | DELAYED_RELEASE_TABLET | Freq: Two times a day (BID) | ORAL | Status: DC

## 2012-10-23 MED ORDER — POTASSIUM CHLORIDE CRYS ER 20 MEQ PO TBCR
40.0000 meq | EXTENDED_RELEASE_TABLET | Freq: Once | ORAL | Status: AC
  Administered 2012-10-23: 40 meq via ORAL
  Filled 2012-10-23: qty 2

## 2012-10-23 NOTE — Consult Note (Addendum)
Referring Provider: No ref. provider found Primary Care Physician:  Leonides Grills, MD Primary Gastroenterologist: DR. Laural Golden  Reason for Consultation:  ANEMIA  HPI:  PT HAS SEEN DR. Laural Golden FOR MANY YEARS FOR SHORT GUT SYNDROME. SHE HAS BEEN SEEN 3 TIMES SINCE JAN 2013 FOR ANEMIA AND DIARRHEA. HER HB IS USUALLY 8.5-9.5 FOR AT LEAST 2-3 YEARS ( RANGE-HB 7.1-11.4 AND CR 1.21-1.55, FERRITIN 132-169 SINCE 2011 ). PT HAS DECLINED ENDOSCOPY IN THE PAST. PT ADMITTED WITH CHEST PAIN/AFIB-RVR. DENIES MELENA OR BRBPR. HER STOOLS ARE HEME NEG. SHE OCCASIONALLY USES NSAIDS AND IS CURRENTLY ON ASA.  SHE WAS NOTED TO HAVE A HB 7.1 ON ADMISSION WHICH INCREASED TO 7.8 AFTER 1u pRBCs. SHE HAS BLOOD INFUSION AND DEVELOPED VOMITING x1 AND CHILLS DURING THE INFUSION. SHE HAD NO BLOOD IN THE VOMIT. PT WAS GIVEN A XANAX. HAS VOICED TO NURSING THAT SHE IS INTERESTED IN AN OUTPATIENT W/U. CURRENTLY ON ASA.   Past Medical History  Diagnosis Date  . Hypertension   . Thyroid condition   . Abdominal adhesions     Past Surgical History  Procedure Date  . Abdominal hysterectomy   . Abdominal surgery   . Colonoscopy   . Upper gastrointestinal endoscopy   . Mastectomy right breast    Prior to Admission medications   Medication Sig Start Date End Date Taking? Authorizing Provider  alendronate (FOSAMAX) 70 MG tablet Take 70 mg by mouth every 7 (seven) days. Take with a full glass of water on an empty stomach. Saturday   Yes Historical Provider, MD  ALPRAZolam Duanne Moron) 1 MG tablet Take 0.5 mg by mouth daily as needed. Patient states that when she takes it she takes 1/2 of a 1 mg (anxiety/nerves)   Yes Historical Provider, MD  Cholecalciferol (VITAMIN D PO) Take 1,200 mg by mouth daily.   Yes Historical Provider, MD  Cyanocobalamin (VITAMIN B-12 IJ) Inject as directed every 30 (thirty) days.   Yes Historical Provider, MD  DULoxetine (CYMBALTA) 60 MG capsule Take 60 mg by mouth daily.   Yes Historical Provider, MD    glucosamine-chondroitin 500-400 MG tablet Take 1 tablet by mouth daily.   Yes Historical Provider, MD  Lactobacillus (ACIDOPHILUS) 100 MG CAPS Take by mouth daily.   Yes Historical Provider, MD  levothyroxine (SYNTHROID, LEVOTHROID) 100 MCG tablet Take by mouth daily. Patient takes 50 mcg daily   Yes Historical Provider, MD  Multiple Vitamin (MULTIVITAMIN) tablet Take 1 tablet by mouth daily.   Yes Historical Provider, MD  olmesartan-hydrochlorothiazide (BENICAR HCT) 20-12.5 MG per tablet Take 1 tablet by mouth daily.     Yes Historical Provider, MD  Omega-3 Fatty Acids (FISH OIL) 1000 MG CAPS Take by mouth daily.   Yes Historical Provider, MD  oxyCODONE-acetaminophen (PERCOCET/ROXICET) 5-325 MG per tablet Take 1 tablet by mouth every 6 (six) hours as needed for pain. 09/18/12  Yes Mirna Mires, MD    Current Facility-Administered Medications  Medication Dose Route Frequency Provider Last Rate Last Dose  . acetaminophen (TYLENOL) tablet 650 mg  650 mg Oral Q6H PRN Kathie Dike, MD   650 mg at 10/23/12 0730   Or  . acetaminophen (TYLENOL) suppository 650 mg  650 mg Rectal Q6H PRN Kathie Dike, MD      . ALPRAZolam Duanne Moron) tablet 0.5 mg  0.5 mg Oral BID PRN Kathie Dike, MD   0.5 mg at 10/23/12 1221  .        0.5 mg at 10/22/12 2210  . amiodarone (PACERONE)  tablet 200 mg  200 mg Oral BID Lendon Colonel, NP   200 mg at 10/23/12 [id number]  . aspirin EC tablet 81 mg  81 mg Oral Daily Kathie Dike, MD   81 mg at 10/23/12 0927  . DULoxetine (CYMBALTA) DR capsule 60 mg  60 mg Oral Daily Kathie Dike, MD   60 mg at 10/23/12 0927  . levothyroxine (SYNTHROID, LEVOTHROID) tablet 50 mcg  50 mcg Oral QAC breakfast Kathie Dike, MD   50 mcg at 10/23/12 0729  . ondansetron (ZOFRAN) tablet 4 mg  4 mg Oral Q6H PRN Kathie Dike, MD   4 mg at 10/23/12 1210   Or  . ondansetron (ZOFRAN) injection 4 mg  4 mg Intravenous Q6H PRN Kathie Dike, MD      . oxyCODONE-acetaminophen (PERCOCET/ROXICET)  5-325 MG per tablet 1 tablet  1 tablet Oral Q6H PRN Kathie Dike, MD   1 tablet at 10/23/12 0827  . polyethylene glycol (MIRALAX / GLYCOLAX) packet 17 g  17 g Oral Daily Kathie Dike, MD      .         . sodium bicarbonate tablet 650 mg  650 mg Oral TID Kathie Dike, MD   650 mg at 10/23/12 1118  .           Allergies as of 10/21/2012 - Review Complete 10/21/2012  Allergen Reaction Noted  . Codeine Nausea And Vomiting 06/13/2012     History   Social History  . Marital Status: Widowed    Spouse Name: N/A    Number of Children: N/A  . Years of Education: 12   Occupational History  . Not on file.   Social History Main Topics  . Smoking status: Former Smoker -- 1.5 packs/day for 20 years    Types: Cigarettes  . Smokeless tobacco: Never Used  . Alcohol Use: No  . Drug Use: No  . Sexually Active: No   Other Topics Concern  . Not on file   Social History Narrative  . No narrative on file    Review of Systems:  PER HPI OTHERWISE ALL SYSTEMS NEGATIVE   Vitals: Blood pressure 143/79, pulse 71, temperature 98.6 F (37 C), temperature source Oral, resp. rate 21, height 5\' 4"  (1.626 m), weight 133 lb 6.1 oz (60.5 kg), SpO2 95.00%.  Physical Exam: General:   Alert,  and cooperative in NAD Head:  Normocephalic and atraumatic. Eyes:  Sclera clear, no icterus.   Conjunctiva pink. Mouth:  LESION ON THE ROOF OF HER MOUTH Neck:  Supple;  Lungs:  Clear throughout to auscultation.   No wheezes. No acute distress. Heart:  Regular rate and rhythm; no murmurs Abdomen:  Soft, nontender and nondistended. Normal bowel sounds, without guarding, and without rebound.   Extremities:  Without clubbing or edema. Neurologic:  Alert and  oriented x4;  grossly normal neurologically. Psych:  Alert and cooperative. Normal mood and FLAT affect.   Lab Results:  Basename 10/23/12 0454 10/22/12 0441 10/21/12 0919  WBC 6.4 6.4 --  HGB 7.8* 7.1* --  HCT 24.2* 22.0* 28.8*  PLT 225 221 --    BMET  Basename 10/23/12 0454 10/22/12 0441  NA 145 140  K 3.3* 4.0  CL 113* 114*  CO2 22 17*  GLUCOSE 91 92  BUN 24* 29*  CREATININE 1.25* 1.54*  CALCIUM 8.5 8.3*   LFT  Basename 10/21/12 0919  PROT 7.1  ALBUMIN 3.2*  AST 12  ALT 8  ALKPHOS 52  BILITOT  0.2*  BILIDIR --  IBILI --     Studies/Results: N/A  Impression: PT WITH LONGSTANDING HISTORY OF ANEMIA-NOW WORSE BUT WITH APPROPRIATE TRANSFUSION RESPONSE. NOW IN AFIB W/ RVR AND HAS AN INDICATION FOR ANTI-COAGULATION. PT INTERESTED IN OP W/U WITH DR. Laural Golden. NO EVIDENCE OF ACTIVE GIB AT THIS TIME.  Plan: 1. TRANSFUSE PRN 2. OK TO CONTNIUE ASA. 3. PT WOULD LIKE DISCUSS ENDOSCOPY WITH DR. Laural Golden. WOULD PERFORM EGD IF PT HAS EVIDENCE OF ACTIVE BLEEDING. PT SHOULD HAVE COMPLETE GI W/U PRIOR TO BEING ANTI-COAGULATED. 4. SOFT MECHANICAL/LOW FAT DIET TODAY. 5. BID PPI 6. ZOFRAN PRN.  RECORDS REVIEWED FROM 2011 TO PRESENT.   LOS: 2 days   Diamon Reddinger  10/23/2012, 12:41 PM

## 2012-10-23 NOTE — Progress Notes (Signed)
[facility]             Progress Note   Subjective: Patient does not have any complaints today.  She does have her chronic back pain.  She denies any chest pain or shortness of breath. She has not had any signs of melena or hematochezia.  Objective: Vital signs in last 24 hours: Temp:  [98 F (36.7 C)-98.6 F (37 C)] 98.2 F (36.8 C) (11/08 2021) Pulse Rate:  [66-89] 89  (11/09 0700) Resp:  [12-21] 19  (11/09 0700) BP: (99-132)/(49-92) 107/92 mmHg (11/09 0700) SpO2:  [93 %-100 %] 95 % (11/09 0700) Weight:  [60.5 kg (133 lb 6.1 oz)] 60.5 kg (133 lb 6.1 oz) (11/09 0500) Weight change: 6.068 kg (13 lb 6.1 oz) Last BM Date: 10/23/12  Intake/Output from previous day: 11/08 0701 - 11/09 0700 In: 2334.2 [P.O.:480; I.V.:1585; Blood:269.2] Out: 800 [Urine:800]     Physical Exam: General: Alert, awake, oriented x3, in no acute distress. HEENT: No bruits, no goiter. Heart: Regular rate and rhythm, without murmurs, rubs, gallops. Lungs: Clear to auscultation bilaterally. Abdomen: Soft, nontender, nondistended, positive bowel sounds. Extremities: No clubbing cyanosis or edema with positive pedal pulses. Neuro: Grossly intact, nonfocal.    Lab Results: Basic Metabolic Panel:  Basename 10/23/12 0454 10/22/12 0441  NA 145 140  K 3.3* 4.0  CL 113* 114*  CO2 22 17*  GLUCOSE 91 92  BUN 24* 29*  CREATININE 1.25* 1.54*  CALCIUM 8.5 8.3*  MG -- --  PHOS -- --   Liver Function Tests:  [facility] 10/21/12 0919  AST 12  ALT 8  ALKPHOS 52  BILITOT 0.2*  PROT 7.1  ALBUMIN 3.2*   No results found for this basename: LIPASE:2,AMYLASE:2 in the last 72 hours No results found for this basename: AMMONIA:2 in the last 72 hours CBC:  Basename 10/23/12 0454 10/22/12 0441 10/21/12 0919  WBC 6.4 6.4 --  NEUTROABS 4.0 -- 6.5  HGB 7.8* 7.1* --  HCT 24.2* 22.0* --  MCV 86.1 86.6 --  PLT 225 221 --   Cardiac Enzymes:  Basename 10/21/12 2346 10/21/12 1746 10/21/12 1209    CKTOTAL -- -- --  CKMB -- -- --  CKMBINDEX -- -- --  TROPONINI <0.30 <0.30 <0.30   BNP: No results found for this basename: PROBNP:3 in the last 72 hours D-Dimer: No results found for this basename: DDIMER:2 in the last 72 hours CBG: No results found for this basename: GLUCAP:6 in the last 72 hours Hemoglobin A1C:  Basename 10/21/12 1209  HGBA1C 5.1   Fasting Lipid Panel: No results found for this basename: CHOL,HDL,LDLCALC,TRIG,CHOLHDL,LDLDIRECT in the last 72 hours Thyroid Function Tests:  Basename 10/21/12 1209  TSH 1.078  T4TOTAL --  FREET4 --  T3FREE --  THYROIDAB --   Anemia Panel:  Basename 10/22/12 0950  VITAMINB12 202*  FOLATE --  FERRITIN 169  TIBC 229*  IRON 22*  RETICCTPCT 2.2   Coagulation:  Basename 10/21/12 0919  LABPROT 13.6  INR 1.05   Urine Drug Screen: Drugs of Abuse  No results found for this basename: labopia,  cocainscrnur,  labbenz,  amphetmu,  thcu,  labbarb    Alcohol Level: No results found for this basename: ETH:2 in the last 72 hours Urinalysis: No results found for this basename: COLORURINE:2,APPERANCEUR:2,LABSPEC:2,PHURINE:2,GLUCOSEU:2,HGBUR:2,BILIRUBINUR:2,KETONESUR:2,PROTEINUR:2,UROBILINOGEN:2,NITRITE:2,LEUKOCYTESUR:2 in the last 72 hours  Recent Results (from the past 240 hour(s))  MRSA PCR SCREENING     Status: Normal   Collection Time   10/21/12 12:53 PM  Component Value Range Status Comment   MRSA by PCR NEGATIVE  NEGATIVE Final     Studies/Results: No results found.  Medications: Scheduled Meds:    . [COMPLETED] ALPRAZolam  0.5 mg Oral Once  . amiodarone  200 mg Oral BID  . aspirin EC  81 mg Oral Daily  . DULoxetine  60 mg Oral Daily  . levothyroxine  50 mcg Oral QAC breakfast  . polyethylene glycol  17 g Oral Daily  . potassium chloride  40 mEq Oral Once  . sodium bicarbonate  650 mg Oral TID   Continuous Infusions:    . [DISCONTINUED]  sodium bicarbonate infusion 1/4 NS 1000 mL 75 mL/hr at  10/23/12 0600   PRN Meds:.acetaminophen, acetaminophen, ALPRAZolam, ondansetron (ZOFRAN) IV, ondansetron, oxyCODONE-acetaminophen, [DISCONTINUED] ALPRAZolam  Assessment/Plan:  Principal Problem:  *Atrial fibrillation with RVR Active Problems:  Hypertension  Anemia  Chest pain  CKD (chronic kidney disease) stage 3, GFR 30-59 ml/min  1. Atrial fibrillation with rapid ventricular response. Patient seems to be maintaining sinus rhythm.  She is currently on oral amiodarone. Heart rate is reasonably controlled.  Will hold off on anticoagulation for now since we have not been able to identify exact cause of anemia. 2. Hypertension. Blood pressure improving today.  Could've been related to anemia. 3. Hypothyroidism. Continue levothyroxine. TSH normal. 4. Anemia. It appears her baseline hemoglobin is between 9-10. During this hospitalization she got as low as 7.1. Anemia panel indicates a combination of iron deficiency and anemia of chronic disease. She has been transfused 1 unit prbc yesterday and hemoglobin is now 7.8.  Will transfuse 1 more unit of blood. Patient has seen Dr. Laural Golden for anemia.  Will ask GI input regarding need for any endoscopy/colonoscopy, especially since anticoagulation would be desired in this patient. 5. Chest pain. Possibly related to atrial fibrillation versus pill esophagitis.Cardiac markers and echo unremarkable.  Chest pain has resolved. 6. Chronic kidney disease. Creatinine appears to be near baseline. She does have a low serum bicarbonate and an anion gap of 9. Lactate normal.  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 Bicarbonate is now improved with infusion.  Discontinue bicarb drip and start oral bicarb supplementation. 7. Chronic back pain. Patient was scheduled to see Dr. Sherwood Gambler on 11/8. This has been rescheduled. 8. Hypokalemia, replete.   9. Dispo: transfer telemetry  Time spent coordinating care: 58mins   LOS: 2 days   Caydon Feasel [facility] Pager: [phone] 10/23/2012, 10:18 AM

## 2012-10-23 NOTE — Progress Notes (Signed)
Called to patients room to find she had vomited while eating lunch. Emesis is tan and does not seem to be suspicious for blood.  Patient currently receiving PRBC's. Patient had become tachy 100's-120's. Patients BP was elevated into the 140's Patient was alert oriented but tremulous and nauseated. Patient denies any pain at this time. MD notified, did not feel like this was a reaction to blood. MD requested I continue infusion. Patient given xanax 0.5mg  and zophran PO.  Patient assisted back to bed and is now resting but continues to be tremulous. VS. HR 103 (NSR), RR 30, BP 170/91, o2, 96% on room air.

## 2012-10-24 DIAGNOSIS — D649 Anemia, unspecified: Secondary | ICD-10-CM | POA: Diagnosis not present

## 2012-10-24 DIAGNOSIS — R079 Chest pain, unspecified: Secondary | ICD-10-CM | POA: Diagnosis not present

## 2012-10-24 DIAGNOSIS — I4891 Unspecified atrial fibrillation: Secondary | ICD-10-CM | POA: Diagnosis not present

## 2012-10-24 LAB — BASIC METABOLIC PANEL
CO2: 25 mEq/L (ref 19–32)
Chloride: 107 mEq/L (ref 96–112)
Creatinine, Ser: 1.4 mg/dL — ABNORMAL HIGH (ref 0.50–1.10)
GFR calc Af Amer: 39 mL/min — ABNORMAL LOW (ref >90)
Potassium: 4.1 mEq/L (ref 3.5–5.1)
Sodium: 139 mEq/L (ref 135–145)

## 2012-10-24 LAB — CBC
MCV: 87 fL (ref 78.0–100.0)
Platelets: 194 10*3/uL (ref 150–400)
RBC: 3.3 MIL/uL — ABNORMAL LOW (ref 3.87–5.11)
RDW: 14.2 % (ref 11.5–15.5)
WBC: 8.1 10*3/uL (ref 4.0–10.5)

## 2012-10-24 LAB — TYPE AND SCREEN
Antibody Screen: NEGATIVE
Unit division: 0

## 2012-10-24 LAB — FOLATE: Folate: >24 ng/mL (ref >5.4)

## 2012-10-24 MED ORDER — AMIODARONE HCL 200 MG PO TABS: 200.0000 mg | ORAL_TABLET | Freq: Two times a day (BID) | ORAL | Status: DC

## 2012-10-24 MED ORDER — ASPIRIN EC 325 MG PO TBEC: 325.0000 mg | DELAYED_RELEASE_TABLET | Freq: Every day | ORAL | Status: DC

## 2012-10-24 MED ORDER — SODIUM BICARBONATE 650 MG PO TABS
650.0000 mg | ORAL_TABLET | Freq: Two times a day (BID) | ORAL | Status: DC
Start: 2012-10-24 — End: 2013-06-10

## 2012-10-24 MED ORDER — PANTOPRAZOLE SODIUM 40 MG PO TBEC: 40.0000 mg | DELAYED_RELEASE_TABLET | Freq: Two times a day (BID) | ORAL | Status: DC

## 2012-10-24 NOTE — Plan of Care (Signed)
SPOKE WITH PT. DISCUSSED BEFITS AND RISKS OF TCS/EGD. PT WILL DISCUSS WITH DR. Laural Golden. PT HAD APPROPRIATE TRANSFUSION RESPONSE. NO ACTIVE GIB. OK D/C TO HOME.

## 2012-10-24 NOTE — Progress Notes (Signed)
Patient given discharge instructions including instructions on making follow up appointments, A-fib education, and changes to medications. Patient alert, oriented and in stable condition at the time of discharge. [place] to verify [facility] RN was set up. RN will see patient on Monday. Patient being discharged home with niece.

## 2012-10-24 NOTE — Discharge Summary (Signed)
Physician Discharge Summary  CANSAS ELL [id number] DOB: 12-09-1925 DOA: 10/21/2012  PCP: Leonides Grills, MD  Admit date: 10/21/2012 Discharge date: 10/24/2012  Time spent: 40 minutes  Recommendations for Outpatient Follow-up:  1. Patient has been set up with home health services for disease management 2. She will follow up with her primary doctor in 2 weeks 3. She will follow up with Dr. Laural Golden in the next week to discuss further work up for anemia 4. She will follow up with cardiology for further management of a fib 5. She will follow up with Dr. Sherwood Gambler to discuss her back pain at the end of the week.  Discharge Diagnoses:  Principal Problem:  *Atrial fibrillation with RVR Active Problems:  Hypertension  Anemia  Chest pain  CKD (chronic kidney disease) stage 3, GFR 30-59 ml/min   Discharge Condition: improved  Diet recommendation: low salt  Filed Weights   10/21/12 1156 10/22/12 0500 10/23/12 0500  Weight: 54.432 kg (120 lb) 57.6 kg (126 lb 15.8 oz) 60.5 kg (133 lb 6.1 oz)    History of present illness:  Jane Andrews is a 78 y.o. female with past medical history of hypertension, hypothyroidism, chronic back pain. Patient was in her usual state of health when she had taken oxycodone this morning for pain. She describes experiencing substernal chest pain after taking the medication. She felt that her pill may have gotten lodged in her throat which resulted in her pain. She had associated diaphoresis, she denies any shortness of breath, no nausea or vomiting, no lightheadedness. Her symptoms persisted she called EMS. She was brought to the hospital and was given aspirin en route. She did not receive any nitroglycerin due to hypotension. She's not had any prior cardiac history or cardiac workup. In the emergency room she was found to be in rapid atrial fibrillation with a heart rate in the high 140s. Blood pressure was also noted to be low in the 80s. Patient  otherwise feels better now her chest pain has resolved. The hospitalist service has been requested to assist with management.   Hospital Course:  This lady was admitted to the hospital for chest pain. She was endometrial fibrillation with rapid ventricular response. Is also noted to be mildly hypotensive. She was started on an amiodarone infusion and spontaneously converted back to sinus rhythm. She was followed by cardiology here in the hospital. 2-D echocardiogram showed preserved ejection fraction, cardiac markers were found to be negative, thyroid studies were normal. She will need to followup with cardiology as an outpatient. It was felt the patient would benefit from anticoagulation, but it was noted that she had a significant decline in her hemoglobin. She is chronically anemic and baseline hemoglobin runs from 9-10. Her hemoglobin did trend down to 7.1. She did not have any gross evidence of bleeding periods stool occult blood was found to be negative. Anemia panel indicated a high deficiency/chronic disease picture. She's been followed by Dr. Laural Golden in the past and has declined endoscopy. She was followed by Dr. Oneida Alar in the hospital, but requests to followup with Dr. Laural Golden as an outpatient to discuss further testing including endoscopy and colonoscopy. It was felt that until a clear etiology of patient's anemia could be determined, anticoagulation should be deferred for now. She understands her increased risk for stroke while not being on anticoagulation. Patient has been place on aspirin as well as proton pump inhibitors. She'll followup with the cardiology service to discuss further anticoagulation after she has discussed with  Dr. Laural Golden. Patient also does have chronic back issues. She will follow up with Dr. Sherwood Gambler to discuss possible surgery on her back. Patient was transfused 2 units of PRBCs her in the hospital and her hemoglobin improved appropriately. She is felt stable to discharge  home  Procedures:  none  Consultations:  [place] [facility]  Gastroenterology, Dr. Oneida Alar  Discharge Exam: Filed Vitals:   10/23/12 1400 10/23/12 1753 10/23/12 2000 10/24/12 0400  BP: 116/63 92/53  104/59  Pulse: 93 85  64  Temp:  98.8 F (37.1 C) 98.6 F (37 C) 98.6 F (37 C)  TempSrc:  Oral Oral Oral  Resp: 20 20  18   Height:      Weight:      SpO2: 94% 95%      General: NAD Cardiovascular: s1, s2 rrr Respiratory: cta b  Discharge Instructions  Discharge Orders    Future Appointments: Provider: Department: Dept Phone: Center:   04/11/2013 9:30 AM Rogene Houston, MD [place] [phone] None     Future Orders Please Complete By Expires   Diet - low sodium heart healthy      Home Health      Questions: Responses:   To provide the following care/treatments RN   Face-to-face encounter      Comments:   I MEMON,JEHANZEB certify that this patient is under my care and that I, or a nurse practitioner or physician's assistant working with me, had a face-to-face encounter that meets the physician face-to-face encounter requirements with this patient on 10/24/2012.   Questions: Responses:   The encounter with the patient was in whole, or in part, for the following medical condition, which is the primary reason for home health care atrial fibrillation   I certify that, based on my findings, the following services are medically necessary home health services Nursing   My clinical findings support the need for the above services Complex treatment plan/patient with lack knowledge disease process and treatment   Further, I certify that my clinical findings support that this patient is homebound due to: Pain interferes with ambulation/mobility   To provide the following care/treatments RN   Increase activity slowly      Call MD for:  extreme fatigue      Call MD for:  persistant dizziness or light-headedness      Call MD for:      Comments:   Chest  pain, palpitations, shortness of breath, blood in stools       Medication List     As of 10/24/2012 10:25 AM    STOP taking these medications         olmesartan-hydrochlorothiazide 20-12.5 MG per tablet   Commonly known as: BENICAR HCT      TAKE these medications         Acidophilus 100 MG Caps   Take by mouth daily.      alendronate 70 MG tablet   Commonly known as: FOSAMAX   Take 70 mg by mouth every 7 (seven) days. Take with a full glass of water on an empty stomach. Saturday      ALPRAZolam 1 MG tablet   Commonly known as: XANAX   Take 0.5 mg by mouth daily as needed. Patient states that when she takes it she takes 1/2 of a 1 mg (anxiety/nerves)      amiodarone 200 MG tablet   Commonly known as: PACERONE   Take 1 tablet (200 mg total) by mouth 2 (two)  times daily.      aspirin EC 325 MG tablet   Take 1 tablet (325 mg total) by mouth daily.      DULoxetine 60 MG capsule   Commonly known as: CYMBALTA   Take 60 mg by mouth daily.      Fish Oil 1000 MG Caps   Take by mouth daily.      glucosamine-chondroitin 500-400 MG tablet   Take 1 tablet by mouth daily.      levothyroxine 100 MCG tablet   Commonly known as: SYNTHROID, LEVOTHROID   Take by mouth daily. Patient takes 50 mcg daily      multivitamin tablet   Take 1 tablet by mouth daily.      oxyCODONE-acetaminophen 5-325 MG per tablet   Commonly known as: PERCOCET/ROXICET   Take 1 tablet by mouth every 6 (six) hours as needed for pain.      pantoprazole 40 MG tablet   Commonly known as: PROTONIX   Take 1 tablet (40 mg total) by mouth 2 (two) times daily before a meal.      sodium bicarbonate 650 MG tablet   Take 1 tablet (650 mg total) by mouth 2 (two) times daily.      VITAMIN B-12 IJ   Inject as directed every 30 (thirty) days.      VITAMIN D PO   Take 1,200 mg by mouth daily.           Follow-up Information    Please follow up.   Contact information:   [place] [facility]       Follow up with Leonides Grills, MD. Schedule an appointment as soon as possible for a visit in 2 weeks.   Contact information:   [id number] [place] [address] [phone]       Follow up with Hosie Spangle, MD. (as scheduled)    Contact information:   [address] [place] [address] [phone]       Follow up with Rogene Houston, MD. (as soon as possible)    Contact information:   [place], [address] [place] [address] [phone]       Follow up with cardiology clinic will call you for appointment.          The results of significant diagnostics from this hospitalization (including imaging, microbiology, ancillary and laboratory) are listed below for reference.    Significant Diagnostic Studies: Dg Chest Portable 1 View  10/21/2012  *RADIOLOGY REPORT*  Clinical Data: Chest pain, hypertension, prior right mastectomy  PORTABLE CHEST - 1 VIEW  Comparison: Portable exam 0915 hours compared to 08/26/2011  Findings: Upper-normal size of cardiac silhouette. Mediastinal contours and pulmonary vascularity normal. Lungs clear. Question underlying emphysematous changes. No pleural effusion or pneumothorax. Prior right mastectomy and axillary node dissection. Osseous demineralization.  IMPRESSION: Question emphysematous changes. No acute abnormalities.   Original Report Authenticated By: Lavonia Dana, M.D.     Microbiology: Recent Results (from the past 240 hour(s))  MRSA PCR SCREENING     Status: Normal   Collection Time   10/21/12 12:53 PM      Component Value Range Status Comment   MRSA by PCR NEGATIVE  NEGATIVE Final      Labs: Basic Metabolic Panel:  Lab [id number] 10/23/12 0454 10/22/12 0441 10/21/12 0919  NA 139 145 140 140  K 4.1 3.3* 4.0 3.8  CL  107 113* 114* 110  CO2 25 22 17* 17*  GLUCOSE 84 91 92 101*  BUN 24* 24* 29* 35*  CREATININE 1.40* 1.25* 1.54* 1.41*   CALCIUM 8.4 8.5 8.3* 9.5  MG -- -- -- --  PHOS -- -- -- --   Liver Function Tests:  Lab 10/21/12 0919  AST 12  ALT 8  ALKPHOS 52  BILITOT 0.2*  PROT 7.1  ALBUMIN 3.2*   No results found for this basename: LIPASE:5,AMYLASE:5 in the last 168 hours No results found for this basename: AMMONIA:5 in the last 168 hours CBC:  Lab 10/24/12 0507 10/23/12 0454 10/22/12 0441 10/21/12 0919  WBC 8.1 6.4 6.4 8.1  NEUTROABS -- 4.0 -- 6.5  HGB 9.3* 7.8* 7.1* 9.3*  HCT 28.7* 24.2* 22.0* 28.8*  MCV 87.0 86.1 86.6 86.0  PLT 194 225 221 275   Cardiac Enzymes:  Lab 10/21/12 2346 10/21/12 1746 10/21/12 1209 10/21/12 0919  CKTOTAL -- -- -- --  CKMB -- -- -- --  CKMBINDEX -- -- -- --  TROPONINI <0.30 <0.30 <0.30 <0.30   BNP: BNP (last 3 results) No results found for this basename: PROBNP:3 in the last 8760 hours CBG: No results found for this basename: GLUCAP:5 in the last 168 hours     Signed:  MEMON,JEHANZEB  [facility] 10/24/2012, 10:25 AM

## 2012-10-25 DIAGNOSIS — I959 Hypotension, unspecified: Secondary | ICD-10-CM | POA: Diagnosis not present

## 2012-10-25 DIAGNOSIS — I4891 Unspecified atrial fibrillation: Secondary | ICD-10-CM | POA: Diagnosis not present

## 2012-10-25 DIAGNOSIS — D649 Anemia, unspecified: Secondary | ICD-10-CM | POA: Diagnosis not present

## 2012-10-25 DIAGNOSIS — M549 Dorsalgia, unspecified: Secondary | ICD-10-CM | POA: Diagnosis not present

## 2012-10-25 DIAGNOSIS — I129 Hypertensive chronic kidney disease with stage 1 through stage 4 chronic kidney disease, or unspecified chronic kidney disease: Secondary | ICD-10-CM | POA: Diagnosis not present

## 2012-10-26 ENCOUNTER — Encounter (INDEPENDENT_AMBULATORY_CARE_PROVIDER_SITE_OTHER): Payer: Self-pay | Admitting: Internal Medicine

## 2012-10-26 ENCOUNTER — Ambulatory Visit (INDEPENDENT_AMBULATORY_CARE_PROVIDER_SITE_OTHER): Payer: Medicare Other | Admitting: Internal Medicine

## 2012-10-26 VITALS — BP 118/70 | HR 88 | Temp 98.7°F | Resp 18 | Ht 64.0 in | Wt 127.3 lb

## 2012-10-26 DIAGNOSIS — D649 Anemia, unspecified: Secondary | ICD-10-CM

## 2012-10-26 NOTE — Progress Notes (Signed)
Presenting complaint;  Evaluation for anemia. Patient received 2 units of PRBCs during recent hospitalization.  History of present illness;  Patient is here accompanied by her niece Ms. Vanessa Barbara. Patient is 78 year old Caucasian female who is well known to me. She has history of short bowel syndrome, tear and overgrowth and history of iron deficiency anemia with GI bleed. I last saw her on 10/11/2012 and she was doing quite well. Her hemoglobin on 09/17/2012 was 10.9. She has been struggling with significant lower back pain. She woke up on 10/21/2012 and took a pain pill and then she thought pill got hung in her esophagus. She became cold and clammy but did not experience shortness of breath. She also noted retrosternal pain. She called 911 and was brought to emergency room via ambulance. She was noted to be in atrial fibrillation with RVR. Her hemoglobin on admission was 9.3 and dropped to 7.1. She was therefore given 2 units of PRBCs and hemoglobin prior to discharge 2 days ago was 9.3. While in the hospital she had negative Hemoccults. Her serum B12 level was low at 202(she is on parenteral B12 every month. Folate level was greater than 24, serum iron was 22, TIBC 229 and saturation 10%. Serum ferritin was normal at 169. Patient did not report hematemesis, melena or rectal bleeding and she also denied vaginal bleeding or hematuria. She was seen by Dr. Jenkins Rouge of [facility] cardiology and Dr. Trinda Pascal. Anti-coagulation was recommended but because of drop in her H&H and prior history of GI bleed EGD and colonoscopy was recommended prior to anti-coagulation. While in the hospital she was begun on amiodarone and converted to normal sinus rhythm. She states her appetite is good. She denies heartburn nausea vomiting dysphagia or abdominal pain. She is having formed 1-2 stools daily. She continues to complain of constant back pain. She is having to take pain medicine so she can function. She has  an appointment with Dr. Sherwood Gambler later this week. Patient was discharged on aspirin but she does not take any OTC NSAIDs.   Current Medications: Current Outpatient Prescriptions  Medication Sig Dispense Refill  . alendronate (FOSAMAX) 70 MG tablet Take 70 mg by mouth every 7 (seven) days. Take with a full glass of water on an empty stomach. Saturday      . ALPRAZolam (XANAX) 1 MG tablet Take 0.5 mg by mouth daily as needed. Patient states that when she takes it she takes 1/2 of a 1 mg (anxiety/nerves)      . amiodarone (PACERONE) 200 MG tablet Take 1 tablet (200 mg total) by mouth 2 (two) times daily.  60 tablet  0  . aspirin EC 325 MG tablet Take 1 tablet (325 mg total) by mouth daily.  30 tablet  0  . Cholecalciferol (VITAMIN D PO) Take 1,200 mg by mouth daily.      . Cyanocobalamin (VITAMIN B-12 IJ) Inject as directed every 30 (thirty) days.      . DULoxetine (CYMBALTA) 60 MG capsule Take 60 mg by mouth daily.      Marland Kitchen glucosamine-chondroitin 500-400 MG tablet Take 1 tablet by mouth daily.      . Lactobacillus (ACIDOPHILUS) 100 MG CAPS Take by mouth daily.      Marland Kitchen levothyroxine (SYNTHROID, LEVOTHROID) 100 MCG tablet Take by mouth daily. Patient takes 50 mcg daily      . Multiple Vitamin (MULTIVITAMIN) tablet Take 1 tablet by mouth daily.      . Omega-3 Fatty Acids (FISH OIL)  1000 MG CAPS Take by mouth daily.      Marland Kitchen oxyCODONE-acetaminophen (PERCOCET/ROXICET) 5-325 MG per tablet Take 1 tablet by mouth every 6 (six) hours as needed for pain.  20 tablet  0  . pantoprazole (PROTONIX) 40 MG tablet Take 1 tablet (40 mg total) by mouth 2 (two) times daily before a meal.  60 tablet  0  . Polysaccharide Iron Complex (POLY-IRON 150 PO) Take by mouth 2 (two) times daily.      . sodium bicarbonate 650 MG tablet Take 1 tablet (650 mg total) by mouth 2 (two) times daily.  30 tablet  0   Past medical history; She has history of GI bleed and iron deficiency anemia. Had EGD and colonoscopy in January 2004  without source of GI bleed. She was treated for H. pylori gastritis in January 2004. She presented again with melena in April 2005. EGD revealed nonerosive gastritis and erosive reflux esophagitis. It was felt she bled from small bowel and advised to discontinue Celebrex. She presented again in June 2006 with recurrent GI bleed and underwent small bowel given capsule study and noted to have 2 jejunal ulcers possibly secondary to aspirin and Fosamax. 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 In November 1994 she presented with small bowel obstruction requiring resection of part of her small bowel and she had second surgery 5 weeks later and had another 16 cm of small bowel resected. History of breast carcinoma status post right mastectomy and remains in remission. History of nephrolithiasis. Chronic kidney disease. Recently begun on sodium bicarbonate. History of depression. She underwent hysterectomy in 1960 for major recurrent bleed requiring 20 units of PRBCs. Severe low back pain evaluated with MRI last month to be evaluated by a neurosurgeon in near future. Asymptomatic cholelithiasis.         Objective: Blood pressure 118/70, pulse 88, temperature 98.7 F (37.1 C), temperature source Oral, resp. rate 18, height 5\' 4"  (1.626 m), weight 127 lb 4.8 oz (57.743 kg). Patient is alert and able to move from chair to examination table with minimal assistance. She does not appear to be pale. Conjunctiva is pink. Sclera is nonicteric Oropharyngeal mucosa is normal. No neck masses or thyromegaly noted. Cardiac exam with regular rhythm normal S1 and S2. She has grade 2/6 systolic ejection murmur heard at aortic area as well as at LLSB. Lungs are clear to auscultation. Abdomen is flat with midline scarring and area with ecchymosis and right lower quadrant. Abdomen is soft and nontender without organomegaly or masses the No LE edema or clubbing noted.  Labs/studies Results: In  addition to lab data as above she had anemia profile on 07/06/2012. Serum iron was 59, TIBC 370 and saturation 16%. Serum ferritin 132. Serum folate was greater than 20.   Assessment:  Patient is 78 year old Caucasian female with complicated GI history who was hospitalized at [facility] last week with new onset of atrial fibrillation with RVR. She has converted back to normal sinus rhythm with amiodarone. While in the hospital hemoglobin dropped from 9.3 to 7.1 and she received 2 units of PRBCs. Her stool was guaiac negative iron studies are suggestive of chronic disease anemia rather than iron deficiency. However she has history of optic ulcer disease as well as small bowel ulcers. Therefore it would be reasonable to examine her upper and lower GI tract prior to instituting anti-coagulation. She is having unrelenting low back pain and if she is in need of urgent surgery GI workup would be  postponed.   Recommendations;   Patient will hold off iron pills for now. Unless she needs back surgery will arrange for EGD and colonoscopy to be performed in the future.

## 2012-10-26 NOTE — Patient Instructions (Addendum)
Esophagogastroduodenoscopy to be scheduled after we hear from you. Stop iron pills for now. Will check hemoglobin at the time of endoscopy

## 2012-10-27 DIAGNOSIS — I129 Hypertensive chronic kidney disease with stage 1 through stage 4 chronic kidney disease, or unspecified chronic kidney disease: Secondary | ICD-10-CM | POA: Diagnosis not present

## 2012-10-27 DIAGNOSIS — I959 Hypotension, unspecified: Secondary | ICD-10-CM | POA: Diagnosis not present

## 2012-10-27 DIAGNOSIS — I4891 Unspecified atrial fibrillation: Secondary | ICD-10-CM | POA: Diagnosis not present

## 2012-10-27 DIAGNOSIS — D649 Anemia, unspecified: Secondary | ICD-10-CM | POA: Diagnosis not present

## 2012-10-27 DIAGNOSIS — M549 Dorsalgia, unspecified: Secondary | ICD-10-CM | POA: Diagnosis not present

## 2012-10-29 DIAGNOSIS — M545 Low back pain, unspecified: Secondary | ICD-10-CM | POA: Diagnosis not present

## 2012-10-29 DIAGNOSIS — M5137 Other intervertebral disc degeneration, lumbosacral region: Secondary | ICD-10-CM | POA: Diagnosis not present

## 2012-10-29 DIAGNOSIS — M48061 Spinal stenosis, lumbar region without neurogenic claudication: Secondary | ICD-10-CM | POA: Diagnosis not present

## 2012-10-29 DIAGNOSIS — M546 Pain in thoracic spine: Secondary | ICD-10-CM | POA: Diagnosis not present

## 2012-10-29 DIAGNOSIS — M47817 Spondylosis without myelopathy or radiculopathy, lumbosacral region: Secondary | ICD-10-CM | POA: Diagnosis not present

## 2012-10-29 DIAGNOSIS — M4120 Other idiopathic scoliosis, site unspecified: Secondary | ICD-10-CM | POA: Diagnosis not present

## 2012-11-01 ENCOUNTER — Telehealth (INDEPENDENT_AMBULATORY_CARE_PROVIDER_SITE_OTHER): Payer: Self-pay | Admitting: *Deleted

## 2012-11-01 ENCOUNTER — Encounter (INDEPENDENT_AMBULATORY_CARE_PROVIDER_SITE_OTHER): Payer: Self-pay

## 2012-11-01 DIAGNOSIS — I129 Hypertensive chronic kidney disease with stage 1 through stage 4 chronic kidney disease, or unspecified chronic kidney disease: Secondary | ICD-10-CM | POA: Diagnosis not present

## 2012-11-01 DIAGNOSIS — IMO0002 Reserved for concepts with insufficient information to code with codable children: Secondary | ICD-10-CM | POA: Diagnosis not present

## 2012-11-01 DIAGNOSIS — I4891 Unspecified atrial fibrillation: Secondary | ICD-10-CM | POA: Diagnosis not present

## 2012-11-01 DIAGNOSIS — I959 Hypotension, unspecified: Secondary | ICD-10-CM | POA: Diagnosis not present

## 2012-11-01 DIAGNOSIS — M549 Dorsalgia, unspecified: Secondary | ICD-10-CM | POA: Diagnosis not present

## 2012-11-01 DIAGNOSIS — D649 Anemia, unspecified: Secondary | ICD-10-CM | POA: Diagnosis not present

## 2012-11-01 NOTE — Telephone Encounter (Signed)
FYI: Patient wanted you to know she went to see her back surgeon and he told her that her back was so bad he couldn't do surgery and he didn't think the shots would help either.  She states she don't think she will be able to go through the TCS because of all the pain she is in. She states it's difficult for her to even walk because of the pain and feels it would be hard for her to do the prep.Marland KitchenMarland KitchenMarland Kitchen

## 2012-11-02 DIAGNOSIS — D649 Anemia, unspecified: Secondary | ICD-10-CM | POA: Diagnosis not present

## 2012-11-02 DIAGNOSIS — I4891 Unspecified atrial fibrillation: Secondary | ICD-10-CM | POA: Diagnosis not present

## 2012-11-02 DIAGNOSIS — I129 Hypertensive chronic kidney disease with stage 1 through stage 4 chronic kidney disease, or unspecified chronic kidney disease: Secondary | ICD-10-CM | POA: Diagnosis not present

## 2012-11-02 DIAGNOSIS — M549 Dorsalgia, unspecified: Secondary | ICD-10-CM | POA: Diagnosis not present

## 2012-11-02 DIAGNOSIS — I959 Hypotension, unspecified: Secondary | ICD-10-CM | POA: Diagnosis not present

## 2012-11-03 NOTE — Telephone Encounter (Signed)
Will postpone EGD and colonoscopy. She can talk with cardiologist about anticoagulation. If she experiences GI bleed while on anti-coagulant will need endoscopic evaluation. Please call patient.

## 2012-11-04 ENCOUNTER — Ambulatory Visit (INDEPENDENT_AMBULATORY_CARE_PROVIDER_SITE_OTHER): Payer: Medicare Other | Admitting: Adult Health

## 2012-11-04 ENCOUNTER — Encounter: Payer: Self-pay | Admitting: Adult Health

## 2012-11-04 VITALS — BP 109/60 | HR 78 | Ht 64.0 in | Wt 126.0 lb

## 2012-11-04 DIAGNOSIS — D649 Anemia, unspecified: Secondary | ICD-10-CM

## 2012-11-04 DIAGNOSIS — M549 Dorsalgia, unspecified: Secondary | ICD-10-CM | POA: Diagnosis not present

## 2012-11-04 DIAGNOSIS — I4891 Unspecified atrial fibrillation: Secondary | ICD-10-CM | POA: Diagnosis not present

## 2012-11-04 DIAGNOSIS — I10 Essential (primary) hypertension: Secondary | ICD-10-CM | POA: Diagnosis not present

## 2012-11-04 DIAGNOSIS — M4180 Other forms of scoliosis, site unspecified: Secondary | ICD-10-CM | POA: Diagnosis not present

## 2012-11-04 DIAGNOSIS — M47817 Spondylosis without myelopathy or radiculopathy, lumbosacral region: Secondary | ICD-10-CM | POA: Diagnosis not present

## 2012-11-04 DIAGNOSIS — I959 Hypotension, unspecified: Secondary | ICD-10-CM | POA: Diagnosis not present

## 2012-11-04 DIAGNOSIS — I129 Hypertensive chronic kidney disease with stage 1 through stage 4 chronic kidney disease, or unspecified chronic kidney disease: Secondary | ICD-10-CM | POA: Diagnosis not present

## 2012-11-04 DIAGNOSIS — M5137 Other intervertebral disc degeneration, lumbosacral region: Secondary | ICD-10-CM | POA: Diagnosis not present

## 2012-11-04 NOTE — Progress Notes (Signed)
HPI: Jane Andrews is a very pleasant 78 year old female patient of Dr. Lattie Haw who we are following for ongoing assessment and treatment of atrial fibrillation, with history of hypertension, hypothyroidism, and chronic back pain. She has been diagnosed with chronic anemia and is being followed for this by her primary care physician. During recent hospitalization in November of 2013 her hemoglobin had dropped to 7.3 and she required a blood transfusion. The patient is due for colonoscopy within the next week or two through her GI specialist. As a result of the anemia, the patient was not placed on anticoagulation with the exception of a baby aspirin daily. She was seen by cardiology in the hospital and risks and benefits of anticoagulation were discussed and it had been decided that aspirin only would be best at this time and tell source of anemia was evaluated. Once she has had her colonoscopy and has been okayed to have anticoagulation we will consider starting this. Her CHADs Score is 2 for age and hypertension. She is without complaint at this time with the exception of chronic back discomfort. She has an appointment later after seeing [name] to have an injection to assist with pain control.  Allergies  Allergen Reactions  . Codeine Nausea And Vomiting    Current Outpatient Prescriptions  Medication Sig Dispense Refill  . alendronate (FOSAMAX) 70 MG tablet Take 70 mg by mouth every 7 (seven) days. Take with a full glass of water on an empty stomach. Saturday      . ALPRAZolam (XANAX) 1 MG tablet Take 0.5 mg by mouth daily as needed. Patient states that when she takes it she takes 1/2 of a 1 mg (anxiety/nerves)      . amiodarone (PACERONE) 200 MG tablet Take 1 tablet (200 mg total) by mouth 2 (two) times daily.  60 tablet  0  . Cholecalciferol (VITAMIN D PO) Take 1,200 mg by mouth daily.      . Cyanocobalamin (VITAMIN B-12 IJ) Inject as directed every 30 (thirty) days.      . DULoxetine (CYMBALTA)  60 MG capsule Take 60 mg by mouth daily.      Marland Kitchen glucosamine-chondroitin 500-400 MG tablet Take 1 tablet by mouth daily.      . Lactobacillus (ACIDOPHILUS) 100 MG CAPS Take by mouth daily.      Marland Kitchen levothyroxine (SYNTHROID, LEVOTHROID) 100 MCG tablet Take by mouth daily. Patient takes 50 mcg daily      . Multiple Vitamin (MULTIVITAMIN) tablet Take 1 tablet by mouth daily.      . Omega-3 Fatty Acids (FISH OIL) 1000 MG CAPS Take by mouth daily.      Marland Kitchen oxyCODONE-acetaminophen (PERCOCET/ROXICET) 5-325 MG per tablet Take 1 tablet by mouth every 6 (six) hours as needed for pain.  20 tablet  0  . pantoprazole (PROTONIX) 40 MG tablet Take 1 tablet (40 mg total) by mouth 2 (two) times daily before a meal.  60 tablet  0  . sodium bicarbonate 650 MG tablet Take 1 tablet (650 mg total) by mouth 2 (two) times daily.  30 tablet  0    Past Medical History  Diagnosis Date  . Hypertension   . Thyroid condition   . Abdominal adhesions   . Atrial fibrillation     Past Surgical History  Procedure Date  . Abdominal hysterectomy   . Abdominal surgery   . Colonoscopy   . Upper gastrointestinal endoscopy   . Mastectomy right breast    [id number] of systems complete and  found to be negative unless listed above  PHYSICAL EXAM BP 109/60  Pulse 78  Ht 5\' 4"  (1.626 m)  Wt 126 lb (57.153 kg)  BMI 21.63 kg/m2  General: Well developed, well nourished, in no acute distress Head: Eyes PERRLA, No xanthomas.   Normal cephalic and atramatic  Lungs: Clear bilaterally to auscultation and percussion. Heart: HRRR S1 S2, without MRG.  Pulses are 2+ & equal.            No carotid bruit. No JVD.  No abdominal bruits. No femoral bruits. Abdomen: Bowel sounds are positive, abdomen soft and non-tender without masses or                  Hernia's noted. Msk:  Back normal, normal gait. Normal strength and tone for age. Extremities: No clubbing, cyanosis or edema.  DP +1 Neuro: Alert and oriented X 3. Psych:  Good affect,  responds appropriately  EKG: NSR rate of 81 bpm.  ASSESSMENT AND PLAN

## 2012-11-04 NOTE — Assessment & Plan Note (Signed)
Continued evaluation for source of this diagnosis is ongoing. As stated she will see GI for colonoscopy, and also continue with her primary care physician for evaluation for anemia and treatment thereof.

## 2012-11-04 NOTE — Assessment & Plan Note (Signed)
She is currently in normal sinus rhythm, on amiodarone 200 mg twice a day. We will need to cut back on amiodarone 200 mg daily on next visit to avoid toxicity, in this elderly lady. I have advised her she begins to have some nausea vomiting weakness or dizziness she is to call us right away. At this time we will continue her on baby aspirin daily. CHADs score is 2 for age and hypertension. We may need to just keep her on aspirin and avoid any other anticoagulation at this time. She is due to followup with her primary GI specialist Dr. Laural Golden and have a colonoscopy sometime once in the next 2 weeks.Jane Andrews

## 2012-11-04 NOTE — Assessment & Plan Note (Signed)
Excellent control of blood pressure at this time. We will not make any changes in her medications.

## 2012-11-04 NOTE — Telephone Encounter (Signed)
I talked with Jane Andrews and she states that she went to her PCP. He gave her Oxycodone 10 mg and her pain is much better. She is to see the NP at the Cardiologist office today and will ask about anticoagulant therapy,as to date they have not put her on it. She feels that she will now be able to precede with the Colonoscopy. Patient advised that Dr.Rehman would be made aware and either myself or Lelon Frohlich would call her

## 2012-11-04 NOTE — Patient Instructions (Addendum)
Your physician recommends that you schedule a follow-up appointment in: 6-8 WEEKS WITH RR/KL  Your physician recommends that you continue on your current medications as directed. Please refer to the Current Medication list given to you today.

## 2012-11-04 NOTE — Progress Notes (Deleted)
Name: Jane Andrews    DOB: [birth year]  Age: 78 y.o.  MR#: [id number]       PCP:  Leonides Grills, MD      Insurance: @PAYORNAME @   CC:   No chief complaint on file.   VS BP 109/60  Pulse 78  Ht 5\' 4"  (1.626 m)  Wt 126 lb (57.153 kg)  BMI 21.63 kg/m2  Weights Current Weight  11/04/12 126 lb (57.153 kg)  10/26/12 127 lb 4.8 oz (57.743 kg)  10/23/12 133 lb 6.1 oz (60.5 kg)    Blood Pressure  BP Readings from Last 3 Encounters:  11/04/12 109/60  10/26/12 118/70  10/24/12 104/59     Admit date:  (Not on file) Last encounter with RMR:  Visit date not found   Allergy Allergies  Allergen Reactions  . Codeine Nausea And Vomiting    Current Outpatient Prescriptions  Medication Sig Dispense Refill  . alendronate (FOSAMAX) 70 MG tablet Take 70 mg by mouth every 7 (seven) days. Take with a full glass of water on an empty stomach. Saturday      . ALPRAZolam (XANAX) 1 MG tablet Take 0.5 mg by mouth daily as needed. Patient states that when she takes it she takes 1/2 of a 1 mg (anxiety/nerves)      . amiodarone (PACERONE) 200 MG tablet Take 1 tablet (200 mg total) by mouth 2 (two) times daily.  60 tablet  0  . Cholecalciferol (VITAMIN D PO) Take 1,200 mg by mouth daily.      . Cyanocobalamin (VITAMIN B-12 IJ) Inject as directed every 30 (thirty) days.      . DULoxetine (CYMBALTA) 60 MG capsule Take 60 mg by mouth daily.      Marland Kitchen glucosamine-chondroitin 500-400 MG tablet Take 1 tablet by mouth daily.      . Lactobacillus (ACIDOPHILUS) 100 MG CAPS Take by mouth daily.      Marland Kitchen levothyroxine (SYNTHROID, LEVOTHROID) 100 MCG tablet Take by mouth daily. Patient takes 50 mcg daily      . Multiple Vitamin (MULTIVITAMIN) tablet Take 1 tablet by mouth daily.      . Omega-3 Fatty Acids (FISH OIL) 1000 MG CAPS Take by mouth daily.      Marland Kitchen oxyCODONE-acetaminophen (PERCOCET/ROXICET) 5-325 MG per tablet Take 1 tablet by mouth every 6 (six) hours as needed for pain.  20 tablet  0  . pantoprazole  (PROTONIX) 40 MG tablet Take 1 tablet (40 mg total) by mouth 2 (two) times daily before a meal.  60 tablet  0  . sodium bicarbonate 650 MG tablet Take 1 tablet (650 mg total) by mouth 2 (two) times daily.  30 tablet  0    Discontinued Meds:    Medications Discontinued During This Encounter  Medication Reason  . aspirin EC 325 MG tablet Discontinued by provider    Patient Active Problem List  Diagnosis  . Chronic diarrhea  . Intestinal bacterial overgrowth  . Hypertension  . Osteoporosis  . Anemia  . Vaginal Discharge  . Lower back pain  . Shoulder pain, left  . Rotator cuff syndrome of left shoulder  . Weakness of right leg  . Difficulty in walking  . Atrial fibrillation with RVR  . Chest pain  . CKD (chronic kidney disease) stage 3, GFR 30-59 ml/min    LABS Admission on 10/21/2012, Discharged on 10/24/2012  Component Date Value  . WBC 10/21/2012 8.1   . RBC 10/21/2012 3.35*  . Hemoglobin 10/21/2012 9.3*  .  HCT 10/21/2012 28.8*  . MCV 10/21/2012 86.0   . [facility] 10/21/2012 27.8   . MCHC 10/21/2012 32.3   . RDW 10/21/2012 13.8   . Platelets 10/21/2012 275   . Neutrophils Relative 10/21/2012 80*  . Neutro Abs 10/21/2012 6.5   . Lymphocytes Relative 10/21/2012 10*  . Lymphs Abs 10/21/2012 0.8   . Monocytes Relative 10/21/2012 9   . Monocytes Absolute 10/21/2012 0.7   . Eosinophils Relative 10/21/2012 2   . Eosinophils Absolute 10/21/2012 0.2   . Basophils Relative 10/21/2012 0   . Basophils Absolute 10/21/2012 0.0   . Prothrombin Time 10/21/2012 13.6   . INR 10/21/2012 1.05   . Sodium 10/21/2012 140   . Potassium 10/21/2012 3.8   . Chloride 10/21/2012 110   . CO2 10/21/2012 17*  . Glucose, Bld 10/21/2012 101*  . BUN 10/21/2012 35*  . Creatinine, Ser 10/21/2012 1.41*  . Calcium 10/21/2012 9.5   . Total Protein 10/21/2012 7.1   . Albumin 10/21/2012 3.2*  . AST 10/21/2012 12   . ALT 10/21/2012 8   . Alkaline Phosphatase 10/21/2012 52   . Total Bilirubin  10/21/2012 0.2*  . GFR calc non Af Amer 10/21/2012 33*  . GFR calc Af Amer 10/21/2012 38*  . Troponin I 10/21/2012 <0.30   . TSH 10/21/2012 1.078   . Troponin I 10/21/2012 <0.30   . Troponin I 10/21/2012 <0.30   . Troponin I 10/21/2012 <0.30   . Hemoglobin A1C 10/21/2012 5.1   . Mean Plasma Glucose 10/21/2012 100   . Lactic Acid, Venous 10/21/2012 1.2   . MRSA by PCR 10/21/2012 NEGATIVE   . Sodium 10/22/2012 140   . Potassium 10/22/2012 4.0   . Chloride 10/22/2012 114*  . CO2 10/22/2012 17*  . Glucose, Bld 10/22/2012 92   . BUN 10/22/2012 29*  . Creatinine, Ser 10/22/2012 1.54*  . Calcium 10/22/2012 8.3*  . GFR calc non Af Amer 10/22/2012 30*  . GFR calc Af Amer 10/22/2012 34*  . WBC 10/22/2012 6.4   . RBC 10/22/2012 2.54*  . Hemoglobin 10/22/2012 7.1*  . HCT 10/22/2012 22.0*  . MCV 10/22/2012 86.6   . [facility] 10/22/2012 28.0   . MCHC 10/22/2012 32.3   . RDW 10/22/2012 14.0   . Platelets 10/22/2012 221   . Order Confirmation 10/22/2012 ORDER PROCESSED BY BLOOD BANK   . Vitamin B-12 10/22/2012 202*  . Folate 10/22/2012 >24.0   . Iron 10/22/2012 22*  . TIBC 10/22/2012 229*  . Saturation Ratios 10/22/2012 10*  . UIBC 10/22/2012 207   . Ferritin 10/22/2012 169   . Retic Ct Pct 10/22/2012 2.2   . RBC. 10/22/2012 2.80*  . Retic Count, Manual 10/22/2012 61.6   . ABO/RH(D) 10/22/2012 O POS   . Antibody Screen 10/22/2012 NEG   . Sample Expiration 10/22/2012 10/25/2012   . Unit Number 10/22/2012 [id number]   . Blood Component Type 10/22/2012 RED CELLS,LR   . Unit division 10/22/2012 00   . Status of Unit 10/22/2012 ISSUED,FINAL   . Transfusion Status 10/22/2012 OK TO TRANSFUSE   . Crossmatch Result 10/22/2012 Compatible   . Unit Number 10/22/2012 [id number]   . Blood Component Type 10/22/2012 RED CELLS,LR   . Unit division 10/22/2012 00   . Status of Unit 10/22/2012 ISSUED,FINAL   . Transfusion Status 10/22/2012 OK TO TRANSFUSE   . Crossmatch Result 10/22/2012  Compatible   . ABO/RH(D) 10/22/2012 O POS   . WBC 10/23/2012 6.4   . RBC 10/23/2012 2.81*  .  Hemoglobin 10/23/2012 7.8*  . HCT 10/23/2012 24.2*  . MCV 10/23/2012 86.1   . [facility] 10/23/2012 27.8   . MCHC 10/23/2012 32.2   . RDW 10/23/2012 13.2   . Platelets 10/23/2012 225   . Neutrophils Relative 10/23/2012 62   . Neutro Abs 10/23/2012 4.0   . Lymphocytes Relative 10/23/2012 18   . Lymphs Abs 10/23/2012 1.2   . Monocytes Relative 10/23/2012 15*  . Monocytes Absolute 10/23/2012 0.9   . Eosinophils Relative 10/23/2012 5   . Eosinophils Absolute 10/23/2012 0.3   . Basophils Relative 10/23/2012 0   . Basophils Absolute 10/23/2012 0.0   . Sodium 10/23/2012 145   . Potassium 10/23/2012 3.3*  . Chloride 10/23/2012 113*  . CO2 10/23/2012 22   . Glucose, Bld 10/23/2012 91   . BUN 10/23/2012 24*  . Creatinine, Ser 10/23/2012 1.25*  . Calcium 10/23/2012 8.5   . GFR calc non Af Amer 10/23/2012 38*  . GFR calc Af Amer 10/23/2012 44*  . Fecal Occult Bld 10/22/2012 NEGATIVE   . WBC 10/24/2012 8.1   . RBC 10/24/2012 3.30*  . Hemoglobin 10/24/2012 9.3*  . HCT 10/24/2012 28.7*  . MCV 10/24/2012 87.0   . [facility] 10/24/2012 28.2   . MCHC 10/24/2012 32.4   . RDW 10/24/2012 14.2   . Platelets 10/24/2012 194   . Sodium 10/24/2012 139   . Potassium 10/24/2012 4.1   . Chloride 10/24/2012 107   . CO2 10/24/2012 25   . Glucose, Bld 10/24/2012 84   . BUN 10/24/2012 24*  . Creatinine, Ser 10/24/2012 1.40*  . Calcium 10/24/2012 8.4   . GFR calc non Af Amer 10/24/2012 33*  . GFR calc Af Amer 10/24/2012 39*  Hospital Outpatient Visit on 09/17/2012  Component Date Value  . Sodium 09/17/2012 140   . Potassium 09/17/2012 3.9   . Chloride 09/17/2012 114*  . BUN 09/17/2012 44*  . Creatinine, Ser 09/17/2012 1.50*  . Glucose, Bld 09/17/2012 103*  . Calcium, Ion 09/17/2012 1.22   . TCO2 09/17/2012 18   . Hemoglobin 09/17/2012 10.9*  . HCT 09/17/2012 32.0*  Orders Only on 08/12/2012  Component Date  Value  . Hemoglobin 09/09/2012 10.4*  . HCT 09/09/2012 31.4*     Results for this Opt Visit:     Results for orders placed during the hospital encounter of 10/21/12  CBC WITH DIFFERENTIAL      Component Value Range   WBC 8.1  4.0 - 10.5 K/uL   RBC 3.35 (*) 3.87 - 5.11 MIL/uL   Hemoglobin 9.3 (*) 12.0 - 15.0 g/dL   HCT 28.8 (*) 36.0 - 46.0 %   MCV 86.0  78.0 - 100.0 fL   MCH 27.8  26.0 - 34.0 pg   MCHC 32.3  30.0 - 36.0 g/dL   RDW 13.8  11.5 - 15.5 %   Platelets 275  150 - 400 K/uL   Neutrophils Relative 80 (*) 43 - 77 %   Neutro Abs 6.5  1.7 - 7.7 K/uL   Lymphocytes Relative 10 (*) 12 - 46 %   Lymphs Abs 0.8  0.7 - 4.0 K/uL   Monocytes Relative 9  3 - 12 %   Monocytes Absolute 0.7  0.1 - 1.0 K/uL   Eosinophils Relative 2  0 - 5 %   Eosinophils Absolute 0.2  0.0 - 0.7 K/uL   Basophils Relative 0  0 - 1 %   Basophils Absolute 0.0  0.0 - 0.1 K/uL  PROTIME-INR  Component Value Range   Prothrombin Time 13.6  11.6 - 15.2 seconds   INR 1.05  0.00 - 1.49  COMPREHENSIVE METABOLIC PANEL      Component Value Range   Sodium 140  135 - 145 mEq/L   Potassium 3.8  3.5 - 5.1 mEq/L   Chloride 110  96 - 112 mEq/L   CO2 17 (*) 19 - 32 mEq/L   Glucose, Bld 101 (*) 70 - 99 mg/dL   BUN 35 (*) 6 - 23 mg/dL   Creatinine, Ser 1.41 (*) 0.50 - 1.10 mg/dL   Calcium 9.5  8.4 - 10.5 mg/dL   Total Protein 7.1  6.0 - 8.3 g/dL   Albumin 3.2 (*) 3.5 - 5.2 g/dL   AST 12  0 - 37 U/L   ALT 8  0 - 35 U/L   Alkaline Phosphatase 52  39 - 117 U/L   Total Bilirubin 0.2 (*) 0.3 - 1.2 mg/dL   GFR calc non Af Amer 33 (*) >90 mL/min   GFR calc Af Amer 38 (*) >90 mL/min  TROPONIN I      Component Value Range   Troponin I <0.30  <0.30 ng/mL  TSH      Component Value Range   TSH 1.078  0.350 - 4.500 uIU/mL  TROPONIN I      Component Value Range   Troponin I <0.30  <0.30 ng/mL  TROPONIN I      Component Value Range   Troponin I <0.30  <0.30 ng/mL  TROPONIN I      Component Value Range   Troponin I  <0.30  <0.30 ng/mL  HEMOGLOBIN A1C      Component Value Range   Hemoglobin A1C 5.1  <5.7 %   Mean Plasma Glucose 100  <117 mg/dL  LACTIC ACID, PLASMA      Component Value Range   Lactic Acid, Venous 1.2  0.5 - 2.2 mmol/L  MRSA PCR SCREENING      Component Value Range   MRSA by PCR NEGATIVE  NEGATIVE  BASIC METABOLIC PANEL      Component Value Range   Sodium 140  135 - 145 mEq/L   Potassium 4.0  3.5 - 5.1 mEq/L   Chloride 114 (*) 96 - 112 mEq/L   CO2 17 (*) 19 - 32 mEq/L   Glucose, Bld 92  70 - 99 mg/dL   BUN 29 (*) 6 - 23 mg/dL   Creatinine, Ser 1.54 (*) 0.50 - 1.10 mg/dL   Calcium 8.3 (*) 8.4 - 10.5 mg/dL   GFR calc non Af Amer 30 (*) >90 mL/min   GFR calc Af Amer 34 (*) >90 mL/min  CBC      Component Value Range   WBC 6.4  4.0 - 10.5 K/uL   RBC 2.54 (*) 3.87 - 5.11 MIL/uL   Hemoglobin 7.1 (*) 12.0 - 15.0 g/dL   HCT 22.0 (*) 36.0 - 46.0 %   MCV 86.6  78.0 - 100.0 fL   MCH 28.0  26.0 - 34.0 pg   MCHC 32.3  30.0 - 36.0 g/dL   RDW 14.0  11.5 - 15.5 %   Platelets 221  150 - 400 K/uL  PREPARE RBC (CROSSMATCH)      Component Value Range   Order Confirmation ORDER PROCESSED BY BLOOD BANK    VITAMIN B12      Component Value Range   Vitamin B-12 202 (*) 211 - 911 pg/mL  FOLATE  Component Value Range   Folate >24.0  >5.4 ng/mL  IRON AND TIBC      Component Value Range   Iron 22 (*) 42 - 135 ug/dL   TIBC 229 (*) 250 - 470 ug/dL   Saturation Ratios 10 (*) 20 - 55 %   UIBC 207  125 - 400 ug/dL  FERRITIN      Component Value Range   Ferritin 169  10 - 291 ng/mL  RETICULOCYTES      Component Value Range   Retic Ct Pct 2.2  0.4 - 3.1 %   RBC. 2.80 (*) 3.87 - 5.11 MIL/uL   Retic Count, Manual 61.6  19.0 - 186.0 K/uL  TYPE AND SCREEN      Component Value Range   ABO/RH(D) O POS     Antibody Screen NEG     Sample Expiration 10/25/2012     Unit Number [id number]     Blood Component Type RED CELLS,LR     Unit division 00     Status of Unit ISSUED,FINAL      Transfusion Status OK TO TRANSFUSE     Crossmatch Result Compatible     Unit Number [id number]     Blood Component Type RED CELLS,LR     Unit division 00     Status of Unit ISSUED,FINAL     Transfusion Status OK TO TRANSFUSE     Crossmatch Result Compatible    ABO/RH      Component Value Range   ABO/RH(D) O POS    CBC WITH DIFFERENTIAL      Component Value Range   WBC 6.4  4.0 - 10.5 K/uL   RBC 2.81 (*) 3.87 - 5.11 MIL/uL   Hemoglobin 7.8 (*) 12.0 - 15.0 g/dL   HCT 24.2 (*) 36.0 - 46.0 %   MCV 86.1  78.0 - 100.0 fL   MCH 27.8  26.0 - 34.0 pg   MCHC 32.2  30.0 - 36.0 g/dL   RDW 13.2  11.5 - 15.5 %   Platelets 225  150 - 400 K/uL   Neutrophils Relative 62  43 - 77 %   Neutro Abs 4.0  1.7 - 7.7 K/uL   Lymphocytes Relative 18  12 - 46 %   Lymphs Abs 1.2  0.7 - 4.0 K/uL   Monocytes Relative 15 (*) 3 - 12 %   Monocytes Absolute 0.9  0.1 - 1.0 K/uL   Eosinophils Relative 5  0 - 5 %   Eosinophils Absolute 0.3  0.0 - 0.7 K/uL   Basophils Relative 0  0 - 1 %   Basophils Absolute 0.0  0.0 - 0.1 K/uL  BASIC METABOLIC PANEL      Component Value Range   Sodium 145  135 - 145 mEq/L   Potassium 3.3 (*) 3.5 - 5.1 mEq/L   Chloride 113 (*) 96 - 112 mEq/L   CO2 22  19 - 32 mEq/L   Glucose, Bld 91  70 - 99 mg/dL   BUN 24 (*) 6 - 23 mg/dL   Creatinine, Ser 1.25 (*) 0.50 - 1.10 mg/dL   Calcium 8.5  8.4 - 10.5 mg/dL   GFR calc non Af Amer 38 (*) >90 mL/min   GFR calc Af Amer 44 (*) >90 mL/min  OCCULT BLOOD X 1 CARD TO LAB, STOOL      Component Value Range   Fecal Occult Bld NEGATIVE    CBC      Component Value Range  WBC 8.1  4.0 - 10.5 K/uL   RBC 3.30 (*) 3.87 - 5.11 MIL/uL   Hemoglobin 9.3 (*) 12.0 - 15.0 g/dL   HCT 28.7 (*) 36.0 - 46.0 %   MCV 87.0  78.0 - 100.0 fL   MCH 28.2  26.0 - 34.0 pg   MCHC 32.4  30.0 - 36.0 g/dL   RDW 14.2  11.5 - 15.5 %   Platelets 194  150 - 400 K/uL  BASIC METABOLIC PANEL      Component Value Range   Sodium 139  135 - 145 mEq/L   Potassium 4.1   3.5 - 5.1 mEq/L   Chloride 107  96 - 112 mEq/L   CO2 25  19 - 32 mEq/L   Glucose, Bld 84  70 - 99 mg/dL   BUN 24 (*) 6 - 23 mg/dL   Creatinine, Ser 1.40 (*) 0.50 - 1.10 mg/dL   Calcium 8.4  8.4 - 10.5 mg/dL   GFR calc non Af Amer 33 (*) >90 mL/min   GFR calc Af Amer 39 (*) >90 mL/min    EKG Orders placed in visit on 11/04/12  . EKG 12-LEAD     Prior Assessment and Plan Problem List as of 11/04/2012            Cardiology Problems   Hypertension   Atrial fibrillation with RVR     Other   Chronic diarrhea   Intestinal bacterial overgrowth   Osteoporosis   Anemia   Vaginal Discharge   Lower back pain   Shoulder pain, left   Rotator cuff syndrome of left shoulder   Weakness of right leg   Difficulty in walking   Chest pain   CKD (chronic kidney disease) stage 3, GFR 30-59 ml/min       Imaging: Dg Chest Portable 1 View  10/21/2012  *RADIOLOGY REPORT*  Clinical Data: Chest pain, hypertension, prior right mastectomy  PORTABLE CHEST - 1 VIEW  Comparison: Portable exam 0915 hours compared to 08/26/2011  Findings: Upper-normal size of cardiac silhouette. Mediastinal contours and pulmonary vascularity normal. Lungs clear. Question underlying emphysematous changes. No pleural effusion or pneumothorax. Prior right mastectomy and axillary node dissection. Osseous demineralization.  IMPRESSION: Question emphysematous changes. No acute abnormalities.   Original Report Authenticated By: Lavonia Dana, M.D.      [facility] Calculation: Score not calculated

## 2012-11-04 NOTE — Telephone Encounter (Signed)
Can schedule patient for EGD and colonoscopy.

## 2012-11-05 ENCOUNTER — Other Ambulatory Visit (INDEPENDENT_AMBULATORY_CARE_PROVIDER_SITE_OTHER): Payer: Self-pay | Admitting: *Deleted

## 2012-11-05 ENCOUNTER — Telehealth (INDEPENDENT_AMBULATORY_CARE_PROVIDER_SITE_OTHER): Payer: Self-pay | Admitting: *Deleted

## 2012-11-05 DIAGNOSIS — D649 Anemia, unspecified: Secondary | ICD-10-CM

## 2012-11-05 DIAGNOSIS — Z1211 Encounter for screening for malignant neoplasm of colon: Secondary | ICD-10-CM

## 2012-11-05 NOTE — Telephone Encounter (Signed)
Patient needs movi prep 

## 2012-11-05 NOTE — Telephone Encounter (Signed)
TCS/EGD sch;d 11/22/12 at [phone] ([phone]), patient aware

## 2012-11-08 MED ORDER — POLYETHYLENE GLYCOL 3350 17 GM/SCOOP PO POWD: 17.0000 g | Freq: Every day | ORAL | Status: DC

## 2012-11-09 ENCOUNTER — Encounter (HOSPITAL_COMMUNITY): Payer: Self-pay | Admitting: Pharmacy Technician

## 2012-11-09 DIAGNOSIS — I959 Hypotension, unspecified: Secondary | ICD-10-CM | POA: Diagnosis not present

## 2012-11-09 DIAGNOSIS — M549 Dorsalgia, unspecified: Secondary | ICD-10-CM | POA: Diagnosis not present

## 2012-11-09 DIAGNOSIS — D649 Anemia, unspecified: Secondary | ICD-10-CM | POA: Diagnosis not present

## 2012-11-09 DIAGNOSIS — I4891 Unspecified atrial fibrillation: Secondary | ICD-10-CM | POA: Diagnosis not present

## 2012-11-09 DIAGNOSIS — I129 Hypertensive chronic kidney disease with stage 1 through stage 4 chronic kidney disease, or unspecified chronic kidney disease: Secondary | ICD-10-CM | POA: Diagnosis not present

## 2012-11-15 ENCOUNTER — Telehealth (INDEPENDENT_AMBULATORY_CARE_PROVIDER_SITE_OTHER): Payer: Self-pay | Admitting: *Deleted

## 2012-11-15 DIAGNOSIS — I4891 Unspecified atrial fibrillation: Secondary | ICD-10-CM | POA: Diagnosis not present

## 2012-11-15 DIAGNOSIS — D649 Anemia, unspecified: Secondary | ICD-10-CM | POA: Diagnosis not present

## 2012-11-15 DIAGNOSIS — I959 Hypotension, unspecified: Secondary | ICD-10-CM | POA: Diagnosis not present

## 2012-11-15 DIAGNOSIS — D518 Other vitamin B12 deficiency anemias: Secondary | ICD-10-CM | POA: Diagnosis not present

## 2012-11-15 DIAGNOSIS — Z1211 Encounter for screening for malignant neoplasm of colon: Secondary | ICD-10-CM

## 2012-11-15 DIAGNOSIS — M549 Dorsalgia, unspecified: Secondary | ICD-10-CM | POA: Diagnosis not present

## 2012-11-15 DIAGNOSIS — I129 Hypertensive chronic kidney disease with stage 1 through stage 4 chronic kidney disease, or unspecified chronic kidney disease: Secondary | ICD-10-CM | POA: Diagnosis not present

## 2012-11-15 MED ORDER — PEG-KCL-NACL-NASULF-NA ASC-C 100 G PO SOLR: 1.0000 | Freq: Once | ORAL | Status: DC

## 2012-11-15 NOTE — Telephone Encounter (Signed)
Patient needs movi prep 

## 2012-11-22 ENCOUNTER — Encounter (HOSPITAL_COMMUNITY): Payer: Self-pay | Admitting: *Deleted

## 2012-11-22 ENCOUNTER — Encounter (HOSPITAL_COMMUNITY)
Admission: RE | Disposition: A | Discharge status: Home / Self Care | Payer: Self-pay | Source: Ambulatory Visit | Attending: Internal Medicine

## 2012-11-22 ENCOUNTER — Ambulatory Visit (HOSPITAL_COMMUNITY)
Admission: RE | Admit: 2012-11-22 | Discharge: 2012-11-22 | Disposition: A | Discharge status: Home / Self Care | Payer: Medicare Other | Source: Ambulatory Visit | Attending: Internal Medicine | Admitting: Internal Medicine

## 2012-11-22 DIAGNOSIS — K644 Residual hemorrhoidal skin tags: Secondary | ICD-10-CM | POA: Insufficient documentation

## 2012-11-22 DIAGNOSIS — K319 Disease of stomach and duodenum, unspecified: Secondary | ICD-10-CM | POA: Diagnosis not present

## 2012-11-22 DIAGNOSIS — Z8719 Personal history of other diseases of the digestive system: Secondary | ICD-10-CM

## 2012-11-22 DIAGNOSIS — K5730 Diverticulosis of large intestine without perforation or abscess without bleeding: Secondary | ICD-10-CM

## 2012-11-22 DIAGNOSIS — D649 Anemia, unspecified: Secondary | ICD-10-CM | POA: Diagnosis not present

## 2012-11-22 DIAGNOSIS — I10 Essential (primary) hypertension: Secondary | ICD-10-CM | POA: Insufficient documentation

## 2012-11-22 DIAGNOSIS — Z8711 Personal history of peptic ulcer disease: Secondary | ICD-10-CM

## 2012-11-22 HISTORY — PX: COLONOSCOPY WITH ESOPHAGOGASTRODUODENOSCOPY (EGD): SHX5779

## 2012-11-22 LAB — CBC
HCT: 32.4 % — ABNORMAL LOW (ref 36.0–46.0)
Hemoglobin: 10.6 g/dL — ABNORMAL LOW (ref 12.0–15.0)
MCH: 28.2 pg (ref 26.0–34.0)
MCHC: 32.7 g/dL (ref 30.0–36.0)
MCV: 86.2 fL (ref 78.0–100.0)
RDW: 17.3 % — ABNORMAL HIGH (ref 11.5–15.5)

## 2012-11-22 SURGERY — COLONOSCOPY WITH ESOPHAGOGASTRODUODENOSCOPY (EGD)
Anesthesia: Moderate Sedation

## 2012-11-22 MED ORDER — MEPERIDINE HCL 50 MG/ML IJ SOLN
INTRAMUSCULAR | Status: AC
  Filled 2012-11-22: qty 1

## 2012-11-22 MED ORDER — MEPERIDINE HCL 50 MG/ML IJ SOLN
INTRAMUSCULAR | Status: DC | PRN
  Administered 2012-11-22 (×2): 25 mg via INTRAVENOUS

## 2012-11-22 MED ORDER — MIDAZOLAM HCL 5 MG/5ML IJ SOLN
INTRAMUSCULAR | Status: DC | PRN
  Administered 2012-11-22 (×2): 1 mg via INTRAVENOUS
  Administered 2012-11-22: 2 mg via INTRAVENOUS
  Administered 2012-11-22: 1 mg via INTRAVENOUS

## 2012-11-22 MED ORDER — SODIUM CHLORIDE 0.45 % IV SOLN
INTRAVENOUS | Status: DC
  Administered 2012-11-22: 07:00:00 via INTRAVENOUS

## 2012-11-22 MED ORDER — BUTAMBEN-TETRACAINE-BENZOCAINE 2-2-14 % EX AERO
INHALATION_SPRAY | CUTANEOUS | Status: DC | PRN
  Administered 2012-11-22: 2 via TOPICAL

## 2012-11-22 MED ORDER — MIDAZOLAM HCL 5 MG/5ML IJ SOLN
INTRAMUSCULAR | Status: AC
  Filled 2012-11-22: qty 10

## 2012-11-22 NOTE — Op Note (Signed)
EGD PROCEDURE REPORT  PATIENT:  Jane Andrews  MR#:  [id number] Birthdate:  06-Jul-1925, 78 y.o., female Endoscopist:  Dr. Rogene Houston, MD Referred By:  Dr. Leonides Grills, MD Procedure Date: 11/22/2012  Procedure:   EGD & Colonoscopy  Indications:  Patient is 78 year old Caucasian female with multiple medical problems who recently developed acute and chronic anemia requiring transfusion. GI bleed was not documented that she has history of peptic ulcer disease as well as distal small bowel ulcers. She is on alendronate for osteoporosis.  Informed Consent:  The risks, benefits, alternatives & imponderables which include, but are not limited to, bleeding, infection, perforation, drug reaction and potential missed lesion have been reviewed.  The potential for biopsy, lesion removal, esophageal dilation, etc. have also been discussed.  Questions have been answered.  All parties agreeable.  Please see history & physical in medical record for more information.  Medications:  Demerol 50 mg IV Versed 5 mg IV Cetacaine spray topically for oropharyngeal anesthesia  EGD  Description of procedure:  The endoscope was introduced through the mouth and advanced to the second portion of the duodenum without difficulty or limitations. The mucosal surfaces were surveyed very carefully during advancement of the scope and upon withdrawal.  Findings:  Esophagus:  Mucosa of the esophagus was normal. GE junction was unremarkable. GEJ:  36 cm Stomach:  Stomach was empty and distended very well with insufflation. Folds in the proximal stomach were normal. Examination of mucosa at body, antrum, pyloric channel, angularis fundus and cardia was normal. Duodenum:  Normal bulbar and post bulbar mucosa.  Therapeutic/Diagnostic Maneuvers Performed:  None  COLONOSCOPY Description of procedure:  After a digital rectal exam was performed, that colonoscope was advanced from the anus through the rectum and colon  to the area of the cecum, ileocecal valve and appendiceal orifice. The cecum was deeply intubated. These structures were well-seen and photographed for the record. From the level of the cecum and ileocecal valve, the scope was slowly and cautiously withdrawn. The mucosal surfaces were carefully surveyed utilizing scope tip to flexion to facilitate fold flattening as needed. The scope was pulled down into the rectum where a thorough exam including retroflexion was performed.  Findings:   Prep satisfactory. Wide-open ileocolonic anastomosis located in the vicinity of hepatic flexure or ascending colon. Few diverticula at sigmoid colon. No ulcers or other mucosal abnormalities noted. Normal rectal mucosa . Small hemorrhoids below the dentate line.  Therapeutic/Diagnostic Maneuvers Performed:  None  Complications:  None  Cecal Withdrawal Time:  8 minutes  Impression:  Normal esophagogastroduodenoscopy. Few diverticula at sigmoid colon and small external hemorrhoids otherwise normal colonoscopy to ileocolonic anastomosis located in the vicinity of hepatic flexure or ascending colon.  Recommendations:  Standard instructions given. Patient can resume iron pills as before. Will check H&H today. Can proceed with anti-coagulation if indicated.  [name]  11/22/2012 8:41 AM  CC: Dr. Leonides Grills, MD & Dr. Rayne Du ref. provider found

## 2012-11-22 NOTE — H&P (Signed)
Jane Andrews is an 78 y.o. female.   Chief Complaint: Patient is here for EGD and colonoscopy. HPI: Patient is 78 year old Caucasian female with complicated medical history he was hospitalized last month for her fibrillation and noted to have drop in her H&H. Serum iron and saturation were low but ferritin was normal. She has history of peptic of the disease as well as distal small bowel ulcers. Therefore endoscopic evaluation is advice prior to anticoagulation therapy. She has history of melena but not in the last couple of months. She states her back pain is much better since she had injection to her back. She does not take NSAIDs. Please see details of her history in my note from 10/26/2012.  Past Medical History  Diagnosis Date  . Hypertension   . Thyroid condition   . Abdominal adhesions   . Atrial fibrillation     Past Surgical History  Procedure Date  . Abdominal hysterectomy   . Abdominal surgery   . Colonoscopy   . Upper gastrointestinal endoscopy   . Mastectomy right breast    Family History  Problem Relation Age of Onset  . Anuerysm Father   . Rheum arthritis Sister   . Healthy Sister   . COPD Sister   . Healthy Brother   . Cancer     Social History:  reports that she has quit smoking. Her smoking use included Cigarettes. She has a 30 pack-year smoking history. She has never used smokeless tobacco. She reports that she does not drink alcohol or use illicit drugs.  Allergies:  Allergies  Allergen Reactions  . Codeine Nausea And Vomiting    Medications Prior to Admission  Medication Sig Dispense Refill  . alendronate (FOSAMAX) 70 MG tablet Take 70 mg by mouth every 7 (seven) days. Take with a full glass of water on an empty stomach. Thursday      . ALPRAZolam (XANAX) 1 MG tablet Take 0.5 mg by mouth daily as needed. Patient states that when she takes it she takes 1/2 of a 1 mg (anxiety/nerves)      . amiodarone (PACERONE) 200 MG tablet Take 1 tablet (200 mg  total) by mouth 2 (two) times daily.  60 tablet  0  . Cholecalciferol (VITAMIN D PO) Take 1,200 mg by mouth daily.      . Cyanocobalamin (VITAMIN B-12 IJ) Inject as directed every 30 (thirty) days.      . DULoxetine (CYMBALTA) 60 MG capsule Take 60 mg by mouth daily.      Marland Kitchen glucosamine-chondroitin 500-400 MG tablet Take 1 tablet by mouth daily.      . Lactobacillus (ACIDOPHILUS) 100 MG CAPS Take 100 mg by mouth daily.       Marland Kitchen levothyroxine (SYNTHROID, LEVOTHROID) 100 MCG tablet Take 50 mcg by mouth daily.       . Multiple Vitamin (MULTIVITAMIN) tablet Take 1 tablet by mouth daily.      . Omega-3 Fatty Acids (FISH OIL) 1000 MG CAPS Take 1,000 mg by mouth daily.       Marland Kitchen oxyCODONE-acetaminophen (PERCOCET/ROXICET) 5-325 MG per tablet Take 1 tablet by mouth every 6 (six) hours as needed for pain.  20 tablet  0  . pantoprazole (PROTONIX) 40 MG tablet Take 1 tablet (40 mg total) by mouth 2 (two) times daily before a meal.  60 tablet  0  . sodium bicarbonate 650 MG tablet Take 1 tablet (650 mg total) by mouth 2 (two) times daily.  30 tablet  0  .  peg 3350 powder (MOVIPREP) 100 G SOLR Take 1 kit (100 g total) by mouth once.  1 kit  0    No results found for this or any previous visit (from the past 48 hour(s)). No results found.  ROS  Blood pressure 139/63, pulse 77, temperature 98 F (36.7 C), temperature source Oral, resp. rate 18, SpO2 95.00%. Physical Exam  Constitutional: She appears well-developed and well-nourished.  HENT:  Mouth/Throat: Oropharynx is clear and moist.  Eyes: Conjunctivae normal are normal. No scleral icterus.  Neck: No thyromegaly present.  Cardiovascular: Normal rate, regular rhythm and normal heart sounds.   Murmur: grade 2/6 systolic ejection murmurbest heard at LLSB. Respiratory: Effort normal and breath sounds normal.  GI: Soft. She exhibits no distension and no mass. There is no tenderness.  Musculoskeletal: She exhibits no edema.  Neurological: She is alert.   Skin: Skin is warm and dry.     Assessment/Plan Acute on chronic anemia with recent drop in H&H requiring transfusion. History of peptic ulcer disease and distal small bowel ulcers. Diagnostic EGD and colonoscopy.  Brentt Fread U 11/22/2012, 7:35 AM

## 2012-11-24 ENCOUNTER — Encounter (HOSPITAL_COMMUNITY): Payer: Self-pay | Admitting: Internal Medicine

## 2012-11-24 ENCOUNTER — Telehealth (INDEPENDENT_AMBULATORY_CARE_PROVIDER_SITE_OTHER): Payer: Self-pay | Admitting: *Deleted

## 2012-11-24 DIAGNOSIS — D649 Anemia, unspecified: Secondary | ICD-10-CM

## 2012-11-24 NOTE — Telephone Encounter (Signed)
Per Dr.Rehman the patient will need to have lab work drawn in 59 month. Noted for January

## 2012-11-25 ENCOUNTER — Telehealth (INDEPENDENT_AMBULATORY_CARE_PROVIDER_SITE_OTHER): Payer: Self-pay | Admitting: *Deleted

## 2012-11-25 ENCOUNTER — Encounter (INDEPENDENT_AMBULATORY_CARE_PROVIDER_SITE_OTHER): Payer: Self-pay | Admitting: *Deleted

## 2012-11-25 DIAGNOSIS — D649 Anemia, unspecified: Secondary | ICD-10-CM

## 2012-11-25 NOTE — Telephone Encounter (Signed)
Lab order done 

## 2012-12-17 DIAGNOSIS — D649 Anemia, unspecified: Secondary | ICD-10-CM | POA: Diagnosis not present

## 2012-12-17 DIAGNOSIS — M549 Dorsalgia, unspecified: Secondary | ICD-10-CM | POA: Diagnosis not present

## 2012-12-17 DIAGNOSIS — I959 Hypotension, unspecified: Secondary | ICD-10-CM | POA: Diagnosis not present

## 2012-12-17 DIAGNOSIS — I4891 Unspecified atrial fibrillation: Secondary | ICD-10-CM | POA: Diagnosis not present

## 2012-12-17 DIAGNOSIS — I129 Hypertensive chronic kidney disease with stage 1 through stage 4 chronic kidney disease, or unspecified chronic kidney disease: Secondary | ICD-10-CM | POA: Diagnosis not present

## 2012-12-23 ENCOUNTER — Other Ambulatory Visit (INDEPENDENT_AMBULATORY_CARE_PROVIDER_SITE_OTHER): Payer: Self-pay | Admitting: Internal Medicine

## 2012-12-23 DIAGNOSIS — M4180 Other forms of scoliosis, site unspecified: Secondary | ICD-10-CM | POA: Diagnosis not present

## 2012-12-23 DIAGNOSIS — M5137 Other intervertebral disc degeneration, lumbosacral region: Secondary | ICD-10-CM | POA: Diagnosis not present

## 2012-12-23 DIAGNOSIS — D649 Anemia, unspecified: Secondary | ICD-10-CM | POA: Diagnosis not present

## 2012-12-23 DIAGNOSIS — M47817 Spondylosis without myelopathy or radiculopathy, lumbosacral region: Secondary | ICD-10-CM | POA: Diagnosis not present

## 2012-12-24 ENCOUNTER — Ambulatory Visit (INDEPENDENT_AMBULATORY_CARE_PROVIDER_SITE_OTHER): Payer: Medicare Other | Admitting: Cardiology

## 2012-12-24 ENCOUNTER — Encounter: Payer: Self-pay | Admitting: Cardiology

## 2012-12-24 VITALS — BP 110/72 | HR 78 | Ht 64.0 in | Wt 124.5 lb

## 2012-12-24 DIAGNOSIS — K909 Intestinal malabsorption, unspecified: Secondary | ICD-10-CM | POA: Insufficient documentation

## 2012-12-24 DIAGNOSIS — D649 Anemia, unspecified: Secondary | ICD-10-CM | POA: Diagnosis not present

## 2012-12-24 DIAGNOSIS — I10 Essential (primary) hypertension: Secondary | ICD-10-CM | POA: Diagnosis not present

## 2012-12-24 DIAGNOSIS — C50919 Malignant neoplasm of unspecified site of unspecified female breast: Secondary | ICD-10-CM | POA: Insufficient documentation

## 2012-12-24 DIAGNOSIS — I4891 Unspecified atrial fibrillation: Secondary | ICD-10-CM | POA: Insufficient documentation

## 2012-12-24 DIAGNOSIS — E039 Hypothyroidism, unspecified: Secondary | ICD-10-CM | POA: Insufficient documentation

## 2012-12-24 LAB — HEMOGLOBIN AND HEMATOCRIT, BLOOD: HCT: 30.2 % — ABNORMAL LOW (ref 36.0–46.0)

## 2012-12-24 NOTE — Assessment & Plan Note (Signed)
Single episode of atrial fibrillation documented during concomitant physiologic stress. She is currently treated with amiodarone and is experiencing no symptoms, but was asymptomatic during her arrhythmia. Thromboembolic risk is increased as the result of her advanced age and female gender, but risk of anticoagulation is also increased. For now, we will continue aspirin therapy and provide her with an event recorder. If no recurrent atrial fibrillation is documented, full anticoagulation will not be recommended.

## 2012-12-24 NOTE — Progress Notes (Deleted)
Name: Jane Andrews    DOB: [birth year]  Age: 79 y.o.  MR#: [id number]       PCP:  Leonides Grills, MD      Insurance: @PAYORNAME @   CC:   No chief complaint on file.   VS BP 110/72  Pulse 78  Ht 5\' 4"  (1.626 m)  Wt 124 lb 8 oz (56.473 kg)  BMI 21.37 kg/m2  SpO2 97%  Weights Current Weight  12/24/12 124 lb 8 oz (56.473 kg)  11/04/12 126 lb (57.153 kg)  10/26/12 127 lb 4.8 oz (57.743 kg)    Blood Pressure  BP Readings from Last 3 Encounters:  12/24/12 110/72  11/22/12 140/68  11/22/12 140/68     Admit date:  (Not on file) Last encounter with RMR:  Visit date not found   Allergy Allergies  Allergen Reactions  . Codeine Nausea And Vomiting    Current Outpatient Prescriptions  Medication Sig Dispense Refill  . alendronate (FOSAMAX) 70 MG tablet Take 70 mg by mouth every 7 (seven) days. Take with a full glass of water on an empty stomach. Thursday      . ALPRAZolam (XANAX) 1 MG tablet Take 0.5 mg by mouth daily as needed. Patient states that when she takes it she takes 1/2 of a 1 mg (anxiety/nerves)      . amiodarone (PACERONE) 200 MG tablet Take 1 tablet (200 mg total) by mouth 2 (two) times daily.  60 tablet  0  . aspirin 81 MG tablet Take 81 mg by mouth daily.      . Cholecalciferol (VITAMIN D PO) Take 1,200 mg by mouth daily.      . Cyanocobalamin (VITAMIN B-12 IJ) Inject as directed every 30 (thirty) days.      . DULoxetine (CYMBALTA) 60 MG capsule Take 60 mg by mouth daily.      Marland Kitchen glucosamine-chondroitin 500-400 MG tablet Take 1 tablet by mouth daily.      . iron polysaccharides (NIFEREX) 150 MG capsule Take 150 mg by mouth 2 (two) times daily.      . Lactobacillus (ACIDOPHILUS) 100 MG CAPS Take 100 mg by mouth daily.       Marland Kitchen levothyroxine (SYNTHROID, LEVOTHROID) 100 MCG tablet Take 100 mcg by mouth daily.       . Multiple Vitamin (MULTIVITAMIN) tablet Take 1 tablet by mouth daily.      . Omega-3 Fatty Acids (FISH OIL) 1000 MG CAPS Take 1,000 mg by mouth daily.        Marland Kitchen oxyCODONE (OXYCONTIN) 10 MG 12 hr tablet Take 10 mg by mouth as needed.      . pantoprazole (PROTONIX) 40 MG tablet Take 1 tablet (40 mg total) by mouth 2 (two) times daily before a meal.  60 tablet  0  . sodium bicarbonate 650 MG tablet Take 1 tablet (650 mg total) by mouth 2 (two) times daily.  30 tablet  0    Discontinued Meds:    Medications Discontinued During This Encounter  Medication Reason  . oxyCODONE-acetaminophen (PERCOCET/ROXICET) 5-325 MG per tablet Error    Patient Active Problem List  Diagnosis  . Chronic diarrhea  . Intestinal bacterial overgrowth  . Hypertension  . Osteoporosis  . Anemia, normocytic normochromic  . CKD (chronic kidney disease) stage 3, GFR 30-59 ml/min  . Hypothyroid  . Atrial fibrillation  . Breast carcinoma  . Malabsorption    LABS Orders Only on 12/23/2012  Component Date Value  . Hemoglobin 12/23/2012 9.8*  .  HCT 12/23/2012 30.2*  Admission on 11/22/2012, Discharged on 11/22/2012  Component Date Value  . WBC 11/22/2012 7.2   . RBC 11/22/2012 3.76*  . Hemoglobin 11/22/2012 10.6*  . HCT 11/22/2012 32.4*  . MCV 11/22/2012 86.2   . [facility] 11/22/2012 28.2   . MCHC 11/22/2012 32.7   . RDW 11/22/2012 17.3*  . Platelets 11/22/2012 172   Admission on 10/21/2012, Discharged on 10/24/2012  Component Date Value  . WBC 10/21/2012 8.1   . RBC 10/21/2012 3.35*  . Hemoglobin 10/21/2012 9.3*  . HCT 10/21/2012 28.8*  . MCV 10/21/2012 86.0   . [facility] 10/21/2012 27.8   . MCHC 10/21/2012 32.3   . RDW 10/21/2012 13.8   . Platelets 10/21/2012 275   . Neutrophils Relative 10/21/2012 80*  . Neutro Abs 10/21/2012 6.5   . Lymphocytes Relative 10/21/2012 10*  . Lymphs Abs 10/21/2012 0.8   . Monocytes Relative 10/21/2012 9   . Monocytes Absolute 10/21/2012 0.7   . Eosinophils Relative 10/21/2012 2   . Eosinophils Absolute 10/21/2012 0.2   . Basophils Relative 10/21/2012 0   . Basophils Absolute 10/21/2012 0.0   . Prothrombin Time 10/21/2012  13.6   . INR 10/21/2012 1.05   . Sodium 10/21/2012 140   . Potassium 10/21/2012 3.8   . Chloride 10/21/2012 110   . CO2 10/21/2012 17*  . Glucose, Bld 10/21/2012 101*  . BUN 10/21/2012 35*  . Creatinine, Ser 10/21/2012 1.41*  . Calcium 10/21/2012 9.5   . Total Protein 10/21/2012 7.1   . Albumin 10/21/2012 3.2*  . AST 10/21/2012 12   . ALT 10/21/2012 8   . Alkaline Phosphatase 10/21/2012 52   . Total Bilirubin 10/21/2012 0.2*  . GFR calc non Af Amer 10/21/2012 33*  . GFR calc Af Amer 10/21/2012 38*  . Troponin I 10/21/2012 <0.30   . TSH 10/21/2012 1.078   . Troponin I 10/21/2012 <0.30   . Troponin I 10/21/2012 <0.30   . Troponin I 10/21/2012 <0.30   . Hemoglobin A1C 10/21/2012 5.1   . Mean Plasma Glucose 10/21/2012 100   . Lactic Acid, Venous 10/21/2012 1.2   . MRSA by PCR 10/21/2012 NEGATIVE   . Sodium 10/22/2012 140   . Potassium 10/22/2012 4.0   . Chloride 10/22/2012 114*  . CO2 10/22/2012 17*  . Glucose, Bld 10/22/2012 92   . BUN 10/22/2012 29*  . Creatinine, Ser 10/22/2012 1.54*  . Calcium 10/22/2012 8.3*  . GFR calc non Af Amer 10/22/2012 30*  . GFR calc Af Amer 10/22/2012 34*  . WBC 10/22/2012 6.4   . RBC 10/22/2012 2.54*  . Hemoglobin 10/22/2012 7.1*  . HCT 10/22/2012 22.0*  . MCV 10/22/2012 86.6   . [facility] 10/22/2012 28.0   . MCHC 10/22/2012 32.3   . RDW 10/22/2012 14.0   . Platelets 10/22/2012 221   . Order Confirmation 10/22/2012 ORDER PROCESSED BY BLOOD BANK   . Vitamin B-12 10/22/2012 202*  . Folate 10/22/2012 >24.0   . Iron 10/22/2012 22*  . TIBC 10/22/2012 229*  . Saturation Ratios 10/22/2012 10*  . UIBC 10/22/2012 207   . Ferritin 10/22/2012 169   . Retic Ct Pct 10/22/2012 2.2   . RBC. 10/22/2012 2.80*  . Retic Count, Manual 10/22/2012 61.6   . ABO/RH(D) 10/22/2012 O POS   . Antibody Screen 10/22/2012 NEG   . Sample Expiration 10/22/2012 10/25/2012   . Unit Number 10/22/2012 [id number]   . Blood Component Type 10/22/2012 RED CELLS,LR   .  Unit division  10/22/2012 00   . Status of Unit 10/22/2012 ISSUED,FINAL   . Transfusion Status 10/22/2012 OK TO TRANSFUSE   . Crossmatch Result 10/22/2012 Compatible   . Unit Number 10/22/2012 [id number]   . Blood Component Type 10/22/2012 RED CELLS,LR   . Unit division 10/22/2012 00   . Status of Unit 10/22/2012 ISSUED,FINAL   . Transfusion Status 10/22/2012 OK TO TRANSFUSE   . Crossmatch Result 10/22/2012 Compatible   . ABO/RH(D) 10/22/2012 O POS   . WBC 10/23/2012 6.4   . RBC 10/23/2012 2.81*  . Hemoglobin 10/23/2012 7.8*  . HCT 10/23/2012 24.2*  . MCV 10/23/2012 86.1   . [facility] 10/23/2012 27.8   . MCHC 10/23/2012 32.2   . RDW 10/23/2012 13.2   . Platelets 10/23/2012 225   . Neutrophils Relative 10/23/2012 62   . Neutro Abs 10/23/2012 4.0   . Lymphocytes Relative 10/23/2012 18   . Lymphs Abs 10/23/2012 1.2   . Monocytes Relative 10/23/2012 15*  . Monocytes Absolute 10/23/2012 0.9   . Eosinophils Relative 10/23/2012 5   . Eosinophils Absolute 10/23/2012 0.3   . Basophils Relative 10/23/2012 0   . Basophils Absolute 10/23/2012 0.0   . Sodium 10/23/2012 145   . Potassium 10/23/2012 3.3*  . Chloride 10/23/2012 113*  . CO2 10/23/2012 22   . Glucose, Bld 10/23/2012 91   . BUN 10/23/2012 24*  . Creatinine, Ser 10/23/2012 1.25*  . Calcium 10/23/2012 8.5   . GFR calc non Af Amer 10/23/2012 38*  . GFR calc Af Amer 10/23/2012 44*  . Fecal Occult Bld 10/22/2012 NEGATIVE   . WBC 10/24/2012 8.1   . RBC 10/24/2012 3.30*  . Hemoglobin 10/24/2012 9.3*  . HCT 10/24/2012 28.7*  . MCV 10/24/2012 87.0   . [place] 10/24/2012 28.2   . MCHC 10/24/2012 32.4   . RDW 10/24/2012 14.2   . Platelets 10/24/2012 194   . Sodium 10/24/2012 139   . Potassium 10/24/2012 4.1   . Chloride 10/24/2012 107   . CO2 10/24/2012 25   . Glucose, Bld 10/24/2012 84   . BUN 10/24/2012 24*  . Creatinine, Ser 10/24/2012 1.40*  . Calcium 10/24/2012 8.4   . GFR calc non Af Amer 10/24/2012 33*  . GFR calc Af Amer  10/24/2012 39*     Results for this Opt Visit:     Results for orders placed in visit on 12/23/12  HEMOGLOBIN AND HEMATOCRIT, BLOOD      Component Value Range   Hemoglobin 9.8 (*) 12.0 - 15.0 g/dL   HCT 30.2 (*) 36.0 - 46.0 %    EKG Orders placed in visit on 11/04/12  . EKG 12-LEAD     Prior Assessment and Plan Problem List as of 12/24/2012          Chronic diarrhea   Intestinal bacterial overgrowth   Hypertension   Last Assessment & Plan Note   11/04/2012 Office Visit Signed 11/04/2012  4:29 PM by Lendon Colonel, NP    Excellent control of blood pressure at this time. We will not make any changes in her medications.    Osteoporosis   Anemia, normocytic normochromic   Last Assessment & Plan Note   11/04/2012 Office Visit Signed 11/04/2012  4:29 PM by Lendon Colonel, NP    Continued evaluation for source of this diagnosis is ongoing. As stated she will see GI for colonoscopy, and also continue with her primary care physician for evaluation for anemia and treatment thereof.    CKD (chronic  kidney disease) stage 3, GFR 30-59 ml/min   Hypothyroid   Atrial fibrillation   Breast carcinoma   Malabsorption       Imaging: No results found.   FRS Calculation: Score not calculated

## 2012-12-24 NOTE — Assessment & Plan Note (Signed)
Patient has had chronic anemia for at least the past 3 years. Iron studies are consistent with iron deficiency except for a ferritin level in excess of 100. No GI blood loss has apparently been documented, and recent upper and lower endoscopy failed to identify a source of bleeding.  Patient believes that she has been evaluated by Dr. Tressie Stalker in the past. We will contact his office to determine if a return visit is warranted.

## 2012-12-24 NOTE — Assessment & Plan Note (Signed)
No elevations of blood pressure recorded since 10/2012. No adjustment in therapy is required.

## 2012-12-24 NOTE — Progress Notes (Signed)
Patient ID: Jane Andrews, female   DOB: 10/05/1925, 79 y.o.   MRN: [id number]  HPI: Scheduled return visit for this lovely woman diagnosed with atrial fibrillation during a recent hospital admission for severe anemia and pill impaction in the esophagus. Upper and lower endoscopy were unremarkable except for the presence of minor diverticulosis. She was transfused with 2 units of packed red blood cells, and hemoglobin and hematocrit have remained stable since. She was asymptomatic during atrial fibrillation and has had no cardiopulmonary symptoms since.  Prior to Admission medications   Medication Sig Start Date End Date Taking? Authorizing Provider  alendronate (FOSAMAX) 70 MG tablet Take 70 mg by mouth every 7 (seven) days. Take with a full glass of water on an empty stomach. Thursday   Yes Historical Provider, MD  ALPRAZolam Duanne Moron) 1 MG tablet Take 0.5 mg by mouth daily as needed. Patient states that when she takes it she takes 1/2 of a 1 mg (anxiety/nerves)   Yes Historical Provider, MD  amiodarone (PACERONE) 200 MG tablet Take 1 tablet (200 mg total) by mouth 2 (two) times daily. 10/24/12  Yes Kathie Dike, MD  aspirin 81 MG tablet Take 81 mg by mouth daily.   Yes Historical Provider, MD  Cholecalciferol (VITAMIN D PO) Take 1,200 mg by mouth daily.   Yes Historical Provider, MD  Cyanocobalamin (VITAMIN B-12 IJ) Inject as directed every 30 (thirty) days.   Yes Historical Provider, MD  DULoxetine (CYMBALTA) 60 MG capsule Take 60 mg by mouth daily.   Yes Historical Provider, MD  glucosamine-chondroitin 500-400 MG tablet Take 1 tablet by mouth daily.   Yes Historical Provider, MD  iron polysaccharides (NIFEREX) 150 MG capsule Take 150 mg by mouth 2 (two) times daily.   Yes Historical Provider, MD  Lactobacillus (ACIDOPHILUS) 100 MG CAPS Take 100 mg by mouth daily.    Yes Historical Provider, MD  levothyroxine (SYNTHROID, LEVOTHROID) 100 MCG tablet Take 100 mcg by mouth daily.    Yes  Historical Provider, MD  Multiple Vitamin (MULTIVITAMIN) tablet Take 1 tablet by mouth daily.   Yes Historical Provider, MD  Omega-3 Fatty Acids (FISH OIL) 1000 MG CAPS Take 1,000 mg by mouth daily.    Yes Historical Provider, MD  oxyCODONE (OXYCONTIN) 10 MG 12 hr tablet Take 10 mg by mouth as needed.   Yes Historical Provider, MD  pantoprazole (PROTONIX) 40 MG tablet Take 1 tablet (40 mg total) by mouth 2 (two) times daily before a meal. 10/24/12  Yes Kathie Dike, MD  sodium bicarbonate 650 MG tablet Take 1 tablet (650 mg total) by mouth 2 (two) times daily. 10/24/12  Yes Kathie Dike, MD   Allergies  Allergen Reactions  . Codeine Nausea And Vomiting  Past medical history, social history, and family history reviewed and updated.  ROS: Denies chest pain, palpitations, lightheadedness or syncope. She suffers no falls. She is noted no hematemesis, hematochezia or melena. All other systems reviewed and are negative.  PHYSICAL EXAM: BP 110/72  Pulse 78  Ht 5\' 4"  (1.626 m)  Wt 56.473 kg (124 lb 8 oz)  BMI 21.37 kg/m2  SpO2 97%  General-Well developed; no acute distress Body habitus-proportionate weight and height Neck-No JVD; minimal carotid bruits versus transmitted murmur Lungs-clear lung fields; resonant to percussion Cardiovascular-normal PMI; normal S1 and preserved S2; grade 2/6 basilar systolic ejection murmur Abdomen-normal bowel sounds; soft and non-tender without masses or organomegaly Musculoskeletal-No deformities, no cyanosis or clubbing Neurologic-Normal cranial nerves; symmetric strength and tone Skin-Warm, no  significant lesions Extremities-distal pulses intact; no edema  EKG Tracing performed 11/04/12 obtained and reviewed: Normal sinus rhythm; delayed R-wave progression; right ventricular conduction delay; low-voltage in the chest leads; otherwise unremarkable.  ASSESSMENT AND PLAN:  Jacqulyn Ducking, MD 12/24/2012 2:34 PM

## 2012-12-24 NOTE — Assessment & Plan Note (Signed)
Patient was concerned about diagnosis of chronic kidney disease. I advised her that renal function is as expected for a woman of her age, and that significant renal problems are not anticipated.

## 2012-12-24 NOTE — Patient Instructions (Signed)
Your physician recommends that you schedule a follow-up appointment in: 1 month  We will refer you to see Dr Orson Gear for your anemia - They will call you with an appt  Stool cards x 3 and return to [name]  Your physician has recommended that you wear an event monitor. Event monitors are medical devices that record the heart's electrical activity. Doctors most often [name] these monitors to diagnose arrhythmias. Arrhythmias are problems with the speed or rhythm of the heartbeat. The monitor is a small, portable device. You can wear one while you do your normal daily activities. This is usually used to diagnose what is causing palpitations/syncope (passing out).

## 2012-12-27 DIAGNOSIS — I4891 Unspecified atrial fibrillation: Secondary | ICD-10-CM | POA: Diagnosis not present

## 2012-12-28 ENCOUNTER — Telehealth (INDEPENDENT_AMBULATORY_CARE_PROVIDER_SITE_OTHER): Payer: Self-pay | Admitting: *Deleted

## 2012-12-28 DIAGNOSIS — D649 Anemia, unspecified: Secondary | ICD-10-CM

## 2012-12-28 NOTE — Telephone Encounter (Signed)
Per Dr.Rehman the patient will need to have H/H , Iron, TIBC, Ferritin done in 1 month. This has been noted for January 28 2013

## 2012-12-30 ENCOUNTER — Ambulatory Visit (INDEPENDENT_AMBULATORY_CARE_PROVIDER_SITE_OTHER): Payer: Medicare Other | Admitting: *Deleted

## 2012-12-30 ENCOUNTER — Other Ambulatory Visit (INDEPENDENT_AMBULATORY_CARE_PROVIDER_SITE_OTHER): Payer: Medicare Other | Admitting: *Deleted

## 2012-12-30 DIAGNOSIS — Z7901 Long term (current) use of anticoagulants: Secondary | ICD-10-CM

## 2012-12-30 DIAGNOSIS — R0989 Other specified symptoms and signs involving the circulatory and respiratory systems: Secondary | ICD-10-CM

## 2012-12-30 LAB — POC HEMOCCULT BLD/STL (HOME/3-CARD/SCREEN)
Card #2 Fecal Occult Blod, POC: NEGATIVE
Card #3 Fecal Occult Blood, POC: POSITIVE
Fecal Occult Blood, POC: NEGATIVE

## 2013-01-04 ENCOUNTER — Encounter: Payer: Self-pay | Admitting: Cardiology

## 2013-01-04 DIAGNOSIS — D518 Other vitamin B12 deficiency anemias: Secondary | ICD-10-CM | POA: Diagnosis not present

## 2013-01-11 ENCOUNTER — Encounter (HOSPITAL_COMMUNITY): Payer: Self-pay | Admitting: Oncology

## 2013-01-11 ENCOUNTER — Encounter (HOSPITAL_COMMUNITY): Payer: Medicare Other | Attending: Oncology | Admitting: Oncology

## 2013-01-11 VITALS — BP 156/86 | HR 90 | Temp 98.1°F | Resp 16 | Ht 63.0 in | Wt 116.8 lb

## 2013-01-11 DIAGNOSIS — I4891 Unspecified atrial fibrillation: Secondary | ICD-10-CM | POA: Insufficient documentation

## 2013-01-11 DIAGNOSIS — E039 Hypothyroidism, unspecified: Secondary | ICD-10-CM | POA: Diagnosis not present

## 2013-01-11 DIAGNOSIS — D649 Anemia, unspecified: Secondary | ICD-10-CM | POA: Diagnosis not present

## 2013-01-11 LAB — COMPREHENSIVE METABOLIC PANEL
Alkaline Phosphatase: 62 U/L (ref 39–117)
BUN: 30 mg/dL — ABNORMAL HIGH (ref 6–23)
CO2: 23 mEq/L (ref 19–32)
Calcium: 10.2 mg/dL (ref 8.4–10.5)
GFR calc Af Amer: 44 mL/min — ABNORMAL LOW (ref >90)
GFR calc non Af Amer: 38 mL/min — ABNORMAL LOW (ref >90)
Glucose, Bld: 102 mg/dL — ABNORMAL HIGH (ref 70–99)
Potassium: 4.1 mEq/L (ref 3.5–5.1)
Total Protein: 7.1 g/dL (ref 6.0–8.3)

## 2013-01-11 LAB — CBC WITH DIFFERENTIAL/PLATELET
Eosinophils Absolute: 0.3 10*3/uL (ref 0.0–0.7)
Eosinophils Relative: 3 % (ref 0–5)
HCT: 34.5 % — ABNORMAL LOW (ref 36.0–46.0)
Hemoglobin: 11.4 g/dL — ABNORMAL LOW (ref 12.0–15.0)
Lymphs Abs: 1.3 10*3/uL (ref 0.7–4.0)
MCH: 28.5 pg (ref 26.0–34.0)
MCV: 86.3 fL (ref 78.0–100.0)
Monocytes Relative: 9 % (ref 3–12)
RBC: 4 MIL/uL (ref 3.87–5.11)

## 2013-01-11 LAB — RETICULOCYTES
RBC.: 4 MIL/uL (ref 3.87–5.11)
Retic Count, Absolute: 76 K/uL (ref 19.0–186.0)
Retic Ct Pct: 1.9 % (ref 0.4–3.1)

## 2013-01-11 NOTE — Progress Notes (Signed)
Problem #1 normocytic anemia unclear etiology Problem #2 history of atrial fibrillation followed by Dr. Lattie Haw presently wearing a monitor  Problem #3 mild renal insufficiency Problem #4 history of vitamin B12 deficiency diagnosed in many years ago on monthly B12 shots, interestingly her B12 level in November was sub-normal. Her level is 202 Problem #5 right-sided breast cancer 25+ years ago operated on by Dr. Collier Salina young with a right modified mastectomy however she did not require radiation therapy, chemotherapy, or hormonal therapy,. 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 She is also status post implant on the right at that time. Problem #6 hypothyroidism on replacement Synthroid Problem #7 osteoporosis on therapy Very very pleasant lady who I have known for several years. I took care of her husband who died several years ago of complications of Charcot-Marie-Tooth disease. She is here today because of anemia. She was found to have a hemoglobin of 7.1 requiring 2 units of blood in November 2013. She is recently done stool cards one of 3 is positive. She has a recent history of colonoscopy she states but has never had EGD as once recommended by Dr. Laural Golden. She does not see blood in her stools, does not crave ice nor does she have other symptoms of pica. She has not lost weight. She has not had headaches shoulder pain nausea vomiting diarrhea fevers chills night sweats etc. Her oncology review of systems remains negative.BP 156/86  Pulse 90  Temp 98.1 F (36.7 C) (Oral)  Resp 16  Ht 5\' 3"  (1.6 m)  Wt 116 lb 12.8 oz (52.98 kg)  BMI 20.69 kg/m2  She is in no acute distress. Interestingly her pulse is 80-84 and regular. There is a grade 2/6 systolic ejection murmur but in the supine position she also appeared to have a midsystolic click. That was not evident in the sitting position leaning forward.  Her lungs are clear. She has no lymphadenopathy. The right chest wall  with implant is negative for masses. The left breast is negative for masses. Her abdomen is soft and nontender without organomegaly. Bowel sounds are normal. She has excellent teeth. Tongue is normal and in the midline. Pupils show prior changes of cataract operations. She is alert and oriented. She has no leg edema. She has very diminished pulses in her feet namely 1+ posterior tibial pulses are dorsalis pedis pulses were not definitively felt.  Facial symmetry is intact.  She needs several blood tests including a repeat B12 level, iron studies, sedimentation rate, and I need to see her back after these are performed. She may need a bone marrow biopsy as well as EGD since she has had a positive Hemoccult.

## 2013-01-11 NOTE — Patient Instructions (Addendum)
[facility] Discharge Instructions  RECOMMENDATIONS MADE BY THE CONSULTANT AND ANY TEST RESULTS WILL BE SENT TO YOUR REFERRING PHYSICIAN.  Follow up with Dr.Rehman as scheduled. Lab work today. We will call you with any abnormal results. Return to clinic in 3 weeks to see MD.  Thank you for choosing [facility] to provide your oncology and hematology care.  To afford each patient quality time with our providers, please arrive at least 15 minutes before your scheduled appointment time.  With your help, our goal is to use those 15 minutes to complete the necessary work-up to ensure our physicians have the information they need to help with your evaluation and healthcare recommendations.    Effective January 1st, 2014, we ask that you re-schedule your appointment with our physicians should you arrive 10 or more minutes late for your appointment.  We strive to give you quality time with our providers, and arriving late affects you and other patients whose appointments are after yours.    Again, thank you for choosing [facility].  Our hope is that these requests will decrease the amount of time that you wait before being seen by our physicians.       _____________________________________________________________  Should you have questions after your visit to [facility], please contact our office at [phone] between the hours of 8:30 a.m. and 5:00 p.m.  Voicemails left after 4:30 p.m. will not be returned until the following business day.  For prescription refill requests, have your pharmacy contact our office with your prescription refill request.

## 2013-01-12 LAB — IRON AND TIBC: Iron: 60 ug/dL (ref 42–135)

## 2013-01-13 ENCOUNTER — Encounter (INDEPENDENT_AMBULATORY_CARE_PROVIDER_SITE_OTHER): Payer: Self-pay | Admitting: *Deleted

## 2013-01-13 ENCOUNTER — Telehealth (INDEPENDENT_AMBULATORY_CARE_PROVIDER_SITE_OTHER): Payer: Self-pay | Admitting: *Deleted

## 2013-01-13 DIAGNOSIS — D649 Anemia, unspecified: Secondary | ICD-10-CM

## 2013-01-13 NOTE — Telephone Encounter (Signed)
Lab order printed

## 2013-01-14 LAB — METHYLMALONIC ACID, SERUM: Methylmalonic Acid, Quantitative: 0.56 umol/L — ABNORMAL HIGH (ref <0.40)

## 2013-01-17 DIAGNOSIS — D649 Anemia, unspecified: Secondary | ICD-10-CM | POA: Diagnosis not present

## 2013-01-17 DIAGNOSIS — J019 Acute sinusitis, unspecified: Secondary | ICD-10-CM | POA: Diagnosis not present

## 2013-01-17 DIAGNOSIS — IMO0002 Reserved for concepts with insufficient information to code with codable children: Secondary | ICD-10-CM | POA: Diagnosis not present

## 2013-01-17 DIAGNOSIS — I4891 Unspecified atrial fibrillation: Secondary | ICD-10-CM | POA: Diagnosis not present

## 2013-01-23 NOTE — Progress Notes (Signed)
She had EGD and colonoscopy on 11/22/2012 and her EGD was normal.

## 2013-01-24 ENCOUNTER — Ambulatory Visit (INDEPENDENT_AMBULATORY_CARE_PROVIDER_SITE_OTHER): Payer: Medicare Other | Admitting: Cardiology

## 2013-01-24 ENCOUNTER — Encounter: Payer: Self-pay | Admitting: Cardiology

## 2013-01-24 ENCOUNTER — Other Ambulatory Visit: Payer: Self-pay | Admitting: Cardiology

## 2013-01-24 VITALS — BP 141/86 | HR 98 | Ht 63.0 in | Wt 119.0 lb

## 2013-01-24 DIAGNOSIS — D649 Anemia, unspecified: Secondary | ICD-10-CM

## 2013-01-24 DIAGNOSIS — I4891 Unspecified atrial fibrillation: Secondary | ICD-10-CM

## 2013-01-24 DIAGNOSIS — N183 Chronic kidney disease, stage 3 unspecified: Secondary | ICD-10-CM

## 2013-01-24 DIAGNOSIS — I10 Essential (primary) hypertension: Secondary | ICD-10-CM | POA: Diagnosis not present

## 2013-01-24 NOTE — Assessment & Plan Note (Signed)
Blood pressure remains normal despite the absence of antihypertensive medication.  She appears to have borderline hypertension without current need for pharmacologic therapy.

## 2013-01-24 NOTE — Assessment & Plan Note (Signed)
Minimal anemia persists, now 3 months following hospitalization and transfusion. An initial episode possibly represented subacute GI blood loss, but no precipitating cause was apparent by upper and lower endoscopy and iron studies were nondiagnostic. In any case, she does not appear to have significant impairment of hematopoiesis or continuing excessive blood loss or destruction. A return visit with Dr. Tressie Stalker is anticipated within the next few weeks.

## 2013-01-24 NOTE — Assessment & Plan Note (Signed)
No evidence for recurrent atrial arrhythmias. We will continue to treat only with aspirin as an antithrombotic agent. Efficacy of amiodarone cannot be assessed, and this agent will be discontinued until and unless atrial fibrillation recurs.

## 2013-01-24 NOTE — Progress Notes (Deleted)
Name: Jane Andrews    DOB: 01-Jul-1925  Age: 79 y.o.  MR#: [id number]       PCP:  Leonides Grills, MD      Insurance: @PAYORNAME @   CC:   LIST SAW MD Tressie Stalker 01-11-13 STOOLS NEGATIVE TIMES 2, POSITIVE TIMES ONE APPOINTMENT TO SEE [facility] 04-11-13 CARDIONET 12-24-12 ENCLOSED REPORT  VS BP 141/86  Pulse 98  Ht 5\' 3"  (1.6 m)  Wt 119 lb (53.978 kg)  BMI 21.09 kg/m2  Weights Current Weight  01/24/13 119 lb (53.978 kg)  01/11/13 116 lb 12.8 oz (52.98 kg)  12/24/12 124 lb 8 oz (56.473 kg)    Blood Pressure  BP Readings from Last 3 Encounters:  01/24/13 141/86  01/11/13 156/86  12/24/12 110/72     Admit date:  (Not on file) Last encounter with RMR:  12/24/2012   Allergy Allergies  Allergen Reactions  . Codeine Nausea And Vomiting    Current Outpatient Prescriptions  Medication Sig Dispense Refill  . alendronate (FOSAMAX) 70 MG tablet Take 70 mg by mouth every 7 (seven) days. Take with a full glass of water on an empty stomach. Thursday      . ALPRAZolam (XANAX) 1 MG tablet Take 0.5 mg by mouth daily as needed. Patient states that when she takes it she takes 1/2 of a 1 mg (anxiety/nerves)      . amiodarone (PACERONE) 200 MG tablet Take 1 tablet (200 mg total) by mouth 2 (two) times daily.  60 tablet  0  . aspirin 81 MG tablet Take 81 mg by mouth daily.      . Cholecalciferol (VITAMIN D PO) Take 1,200 mg by mouth daily.      . Cyanocobalamin (VITAMIN B-12 IJ) Inject as directed every 30 (thirty) days.      . DULoxetine (CYMBALTA) 60 MG capsule Take 60 mg by mouth daily.      Marland Kitchen glucosamine-chondroitin 500-400 MG tablet Take 1 tablet by mouth daily.      . iron polysaccharides (NIFEREX) 150 MG capsule Take 150 mg by mouth once.       . Lactobacillus (ACIDOPHILUS) 100 MG CAPS Take 100 mg by mouth daily.       Marland Kitchen levothyroxine (SYNTHROID, LEVOTHROID) 100 MCG tablet Take 100 mcg by mouth daily.       . Multiple Vitamin (MULTIVITAMIN) tablet Take 1 tablet by mouth daily.      .  Omega-3 Fatty Acids (FISH OIL) 1000 MG CAPS Take 1,000 mg by mouth daily.       Marland Kitchen oxyCODONE (OXYCONTIN) 10 MG 12 hr tablet Take 10 mg by mouth as needed.      . sodium bicarbonate 650 MG tablet Take 1 tablet (650 mg total) by mouth 2 (two) times daily.  30 tablet  0   No current facility-administered medications for this visit.    Discontinued Meds:   There are no discontinued medications.  Patient Active Problem List  Diagnosis  . Chronic diarrhea  . Intestinal bacterial overgrowth  . Hypertension  . Osteoporosis  . Anemia, normocytic normochromic  . CKD (chronic kidney disease) stage 3, GFR 30-59 ml/min  . Hypothyroid  . Atrial fibrillation  . Breast carcinoma  . Malabsorption    LABS Office Visit on 01/11/2013  Component Date Value  . WBC 01/11/2013 8.9   . RBC 01/11/2013 4.00   . Hemoglobin 01/11/2013 11.4*  . HCT 01/11/2013 34.5*  . MCV 01/11/2013 86.3   . [facility] 01/11/2013 28.5   .  MCHC 01/11/2013 33.0   . RDW 01/11/2013 17.4*  . Platelets 01/11/2013 177   . Neutrophils Relative 01/11/2013 74   . Neutro Abs 01/11/2013 6.6   . Lymphocytes Relative 01/11/2013 14   . Lymphs Abs 01/11/2013 1.3   . Monocytes Relative 01/11/2013 9   . Monocytes Absolute 01/11/2013 0.8   . Eosinophils Relative 01/11/2013 3   . Eosinophils Absolute 01/11/2013 0.3   . Basophils Relative 01/11/2013 0   . Basophils Absolute 01/11/2013 0.0   . Sodium 01/11/2013 138   . Potassium 01/11/2013 4.1   . Chloride 01/11/2013 103   . CO2 01/11/2013 23   . Glucose, Bld 01/11/2013 102*  . BUN 01/11/2013 30*  . Creatinine, Ser 01/11/2013 1.26*  . Calcium 01/11/2013 10.2   . Total Protein 01/11/2013 7.1   . Albumin 01/11/2013 3.8   . AST 01/11/2013 14   . ALT 01/11/2013 20   . Alkaline Phosphatase 01/11/2013 62   . Total Bilirubin 01/11/2013 0.3   . GFR calc non Af Amer 01/11/2013 38*  . GFR calc Af Amer 01/11/2013 44*  . Retic Ct Pct 01/11/2013 1.9   . RBC. 01/11/2013 4.00   . Retic Count,  Manual 01/11/2013 76.0   . Iron 01/11/2013 60   . TIBC 01/11/2013 379   . Saturation Ratios 01/11/2013 16*  . UIBC 01/11/2013 319   . Vitamin B-12 01/11/2013 424   . Methylmalonic Acid, Quan* 01/11/2013 0.56*  . Sed Rate 01/11/2013 19   Orders Only on 12/30/2012  Component Date Value  . Fecal Occult Blood, POC 12/30/2012 Negative   . Card #2 Fecal Occult Blo* 12/30/2012 Negative   . Card #3 Fecal Occult Blo* 12/30/2012 Positive   Orders Only on 12/23/2012  Component Date Value  . Hemoglobin 12/23/2012 9.8*  . HCT 12/23/2012 30.2*  Admission on 11/22/2012, Discharged on 11/22/2012  Component Date Value  . WBC 11/22/2012 7.2   . RBC 11/22/2012 3.76*  . Hemoglobin 11/22/2012 10.6*  . HCT 11/22/2012 32.4*  . MCV 11/22/2012 86.2   . [place] 11/22/2012 28.2   . MCHC 11/22/2012 32.7   . RDW 11/22/2012 17.3*  . Platelets 11/22/2012 172      Results for this Opt Visit:     Results for orders placed in visit on 01/11/13  CBC WITH DIFFERENTIAL      Result Value Range   WBC 8.9  4.0 - 10.5 K/uL   RBC 4.00  3.87 - 5.11 MIL/uL   Hemoglobin 11.4 (*) 12.0 - 15.0 g/dL   HCT 34.5 (*) 36.0 - 46.0 %   MCV 86.3  78.0 - 100.0 fL   MCH 28.5  26.0 - 34.0 pg   MCHC 33.0  30.0 - 36.0 g/dL   RDW 17.4 (*) 11.5 - 15.5 %   Platelets 177  150 - 400 K/uL   Neutrophils Relative 74  43 - 77 %   Neutro Abs 6.6  1.7 - 7.7 K/uL   Lymphocytes Relative 14  12 - 46 %   Lymphs Abs 1.3  0.7 - 4.0 K/uL   Monocytes Relative 9  3 - 12 %   Monocytes Absolute 0.8  0.1 - 1.0 K/uL   Eosinophils Relative 3  0 - 5 %   Eosinophils Absolute 0.3  0.0 - 0.7 K/uL   Basophils Relative 0  0 - 1 %   Basophils Absolute 0.0  0.0 - 0.1 K/uL  COMPREHENSIVE METABOLIC PANEL      Result Value Range  Sodium 138  135 - 145 mEq/L   Potassium 4.1  3.5 - 5.1 mEq/L   Chloride 103  96 - 112 mEq/L   CO2 23  19 - 32 mEq/L   Glucose, Bld 102 (*) 70 - 99 mg/dL   BUN 30 (*) 6 - 23 mg/dL   Creatinine, Ser 1.26 (*) 0.50 - 1.10 mg/dL    Calcium 10.2  8.4 - 10.5 mg/dL   Total Protein 7.1  6.0 - 8.3 g/dL   Albumin 3.8  3.5 - 5.2 g/dL   AST 14  0 - 37 U/L   ALT 20  0 - 35 U/L   Alkaline Phosphatase 62  39 - 117 U/L   Total Bilirubin 0.3  0.3 - 1.2 mg/dL   GFR calc non Af Amer 38 (*) >90 mL/min   GFR calc Af Amer 44 (*) >90 mL/min  RETICULOCYTES      Result Value Range   Retic Ct Pct 1.9  0.4 - 3.1 %   RBC. 4.00  3.87 - 5.11 MIL/uL   Retic Count, Manual 76.0  19.0 - 186.0 K/uL  IRON AND TIBC      Result Value Range   Iron 60  42 - 135 ug/dL   TIBC 379  250 - 470 ug/dL   Saturation Ratios 16 (*) 20 - 55 %   UIBC 319  125 - 400 ug/dL  VITAMIN B12      Result Value Range   Vitamin B-12 424  211 - 911 pg/mL  METHYLMALONIC ACID, SERUM      Result Value Range   Methylmalonic Acid, Quantitative 0.56 (*) <0.40 umol/L  SEDIMENTATION RATE      Result Value Range   Sed Rate 19  0 - 22 mm/hr    EKG Orders placed in visit on 01/24/13  . CARDIAC EVENT MONITOR     Prior Assessment and Plan Problem List as of 01/24/2013     ICD-9-CM   Chronic diarrhea   Intestinal bacterial overgrowth   Hypertension   Last Assessment & Plan   12/24/2012 Office Visit Written 12/24/2012  2:33 PM by Yehuda Savannah, MD     No elevations of blood pressure recorded since 10/2012. No adjustment in therapy is required.    Osteoporosis   Anemia, normocytic normochromic   Last Assessment & Plan   12/24/2012 Office Visit Written 12/24/2012  2:30 PM by Yehuda Savannah, MD     Patient has had chronic anemia for at least the past 3 years. Iron studies are consistent with iron deficiency except for a ferritin level in excess of 100. No GI blood loss has apparently been documented, and recent upper and lower endoscopy failed to identify a source of bleeding.  Patient believes that she has been evaluated by Dr. Tressie Stalker in the past. We will contact his office to determine if a return visit is warranted.    CKD (chronic kidney disease) stage 3, GFR  30-59 ml/min   Last Assessment & Plan   12/24/2012 Office Visit Written 12/24/2012  2:32 PM by Yehuda Savannah, MD     Patient was concerned about diagnosis of chronic kidney disease. I advised her that renal function is as expected for a woman of her age, and that significant renal problems are not anticipated.    Hypothyroid   Atrial fibrillation   Last Assessment & Plan   12/24/2012 Office Visit Written 12/24/2012  2:32 PM by Yehuda Savannah, MD  Single episode of atrial fibrillation documented during concomitant physiologic stress. She is currently treated with amiodarone and is experiencing no symptoms, but was asymptomatic during her arrhythmia. Thromboembolic risk is increased as the result of her advanced age and female gender, but risk of anticoagulation is also increased. For now, we will continue aspirin therapy and provide her with an event recorder. If no recurrent atrial fibrillation is documented, full anticoagulation will not be recommended.    Breast carcinoma   Malabsorption       Imaging: No results found.   FRS Calculation: Score not calculated

## 2013-01-24 NOTE — Patient Instructions (Addendum)
Your physician recommends that you schedule a follow-up appointment in: [place]  Your physician recommends that you schedule a follow-up appointment in: [place] VISIT-RHYTHM STRIP/VS  Your physician has recommended you make the following change in your medication:   1) STOP AMIODARONE

## 2013-01-24 NOTE — Assessment & Plan Note (Signed)
Renal function has been stable since 10/2012, and renal status is relatively good in light of patient's advanced age.

## 2013-01-24 NOTE — Progress Notes (Signed)
Patient ID: Jane Andrews, female   DOB: 1925/09/07, 79 y.o.   MRN: [id number]  HPI: Scheduled return visit for this very nice older woman with a single documented episode of atrial fibrillation during hospital admission for pill impaction and severe anemia. She has done quite well since that hospitalization with subsequent negative EGD and colonoscopy and initial evaluation by Dr. Tressie Stalker failing to identify a cause for anemia.  She has been asymptomatic with good exercise tolerance.  Prior to Admission medications   Medication Sig Start Date End Date Taking? Authorizing Provider  alendronate (FOSAMAX) 70 MG tablet Take 70 mg by mouth every 7 (seven) days. Take with a full glass of water on an empty stomach. Thursday   Yes Historical Provider, MD  ALPRAZolam Duanne Moron) 1 MG tablet Take 0.5 mg by mouth daily as needed. Patient states that when she takes it she takes 1/2 of a 1 mg (anxiety/nerves)   Yes Historical Provider, MD  amiodarone (PACERONE) 200 MG tablet Take 1 tablet (200 mg total) by mouth 2 (two) times daily. 10/24/12  Yes Kathie Dike, MD  aspirin 81 MG tablet Take 81 mg by mouth daily.   Yes Historical Provider, MD  Cholecalciferol (VITAMIN D PO) Take 1,200 mg by mouth daily.   Yes Historical Provider, MD  Cyanocobalamin (VITAMIN B-12 IJ) Inject as directed every 30 (thirty) days.   Yes Historical Provider, MD  DULoxetine (CYMBALTA) 60 MG capsule Take 60 mg by mouth daily.   Yes Historical Provider, MD  glucosamine-chondroitin 500-400 MG tablet Take 1 tablet by mouth daily.   Yes Historical Provider, MD  iron polysaccharides (NIFEREX) 150 MG capsule Take 150 mg by mouth once.    Yes Historical Provider, MD  Lactobacillus (ACIDOPHILUS) 100 MG CAPS Take 100 mg by mouth daily.    Yes Historical Provider, MD  levothyroxine (SYNTHROID, LEVOTHROID) 100 MCG tablet Take 100 mcg by mouth daily.    Yes Historical Provider, MD  Multiple Vitamin (MULTIVITAMIN) tablet Take 1 tablet by mouth  daily.   Yes Historical Provider, MD  Omega-3 Fatty Acids (FISH OIL) 1000 MG CAPS Take 1,000 mg by mouth daily.    Yes Historical Provider, MD  oxyCODONE (OXYCONTIN) 10 MG 12 hr tablet Take 10 mg by mouth as needed.   Yes Historical Provider, MD  sodium bicarbonate 650 MG tablet Take 1 tablet (650 mg total) by mouth 2 (two) times daily. 10/24/12  Yes Kathie Dike, MD   Allergies  Allergen Reactions  . Codeine Nausea And Vomiting  Past medical history, social history, and family history reviewed and updated.  ROS: Denies chest discomfort, palpitations, lightheadedness or syncope. She was treated with antibiotics for a URI, but developed fecal incontinence and stopped that medication. URI symptoms have improved. All other systems reviewed and are negative.  PHYSICAL EXAM: BP 141/86  Pulse 98  Ht 5\' 3"  (1.6 m)  Wt 53.978 kg (119 lb)  BMI 21.09 kg/m2  General-Well developed; no acute distress Body habitus-proportionate weight and height Neck-No JVD; no carotid bruits Lungs-clear lung fields; resonant to percussion Cardiovascular-normal PMI; normal S1 and S2; grade 2/6 early peaking systolic ejection murmur at the upper left sternal border Abdomen-normal bowel sounds; soft and non-tender without masses or organomegaly Musculoskeletal-No deformities, no cyanosis or clubbing Neurologic-Normal cranial nerves; symmetric strength and tone Skin-Warm, no significant lesions Extremities-distal pulses intact; no edema  Rhythm Strip: Normal sinus rhythm at a rate of 95 bpm; borderline first degree AV block.  ASSESSMENT AND PLAN:  Jacqulyn Ducking,  MD 01/24/2013 2:14 PM

## 2013-02-01 ENCOUNTER — Ambulatory Visit (HOSPITAL_COMMUNITY): Payer: Federal, State, Local not specified - PPO | Admitting: Oncology

## 2013-02-01 DIAGNOSIS — M47817 Spondylosis without myelopathy or radiculopathy, lumbosacral region: Secondary | ICD-10-CM | POA: Diagnosis not present

## 2013-02-02 ENCOUNTER — Encounter (HOSPITAL_COMMUNITY): Payer: Medicare Other | Attending: Oncology | Admitting: Oncology

## 2013-02-02 VITALS — BP 121/77 | HR 99 | Temp 98.8°F | Resp 18 | Wt 116.0 lb

## 2013-02-02 DIAGNOSIS — N289 Disorder of kidney and ureter, unspecified: Secondary | ICD-10-CM | POA: Diagnosis not present

## 2013-02-02 DIAGNOSIS — M810 Age-related osteoporosis without current pathological fracture: Secondary | ICD-10-CM | POA: Diagnosis not present

## 2013-02-02 DIAGNOSIS — E538 Deficiency of other specified B group vitamins: Secondary | ICD-10-CM

## 2013-02-02 DIAGNOSIS — D649 Anemia, unspecified: Secondary | ICD-10-CM | POA: Diagnosis not present

## 2013-02-02 DIAGNOSIS — D500 Iron deficiency anemia secondary to blood loss (chronic): Secondary | ICD-10-CM | POA: Insufficient documentation

## 2013-02-02 NOTE — Progress Notes (Signed)
#  1 anemia presently consistent with iron deficiency #2 atrial fibrillation in the past not in need of more intervention according to the patient today. #3 mild renal insufficiency #4 vitamin B12 deficiency diagnosed many years ago on monthly B12 shots, interestingly her B12 level in November 2030 was abnormal at 202 but repeat you the day is back to 424 #5 hypothyroidism #6 osteoporosis on therapy #7 right-sided breast cancer 25+ years ago with notice recurrence treated at that time with a modified radical mastectomy. She had no further therapy.  She is doing very well with her Niferex 150 mg a day. She could not tolerate 2 a day. Interestingly her hemoglobin is much improved compared to prior to her transfusion and definitely better even compared to after her transfusions. Her iron however 60 with a TIBC of 379 consistent with iron deficiency still. Her ferritin however has not made it to the chart since January 28.  Because she had guaiac positive stool I will contact Dr. Laural Golden about a possible capsule camera study.  We will check her again in may with blood work and a CBC and see her then. She states she is feeling much better overall.

## 2013-02-02 NOTE — Patient Instructions (Addendum)
.  [facility] Discharge Instructions  RECOMMENDATIONS MADE BY THE CONSULTANT AND ANY TEST RESULTS WILL BE SENT TO YOUR REFERRING PHYSICIAN.  EXAM FINDINGS BY THE PHYSICIAN TODAY AND SIGNS OR SYMPTOMS TO REPORT TO CLINIC OR PRIMARY PHYSICIAN: Exam per Dr. Tressie Stalker He will also talk to Dr. Laural Golden about possibly doing camera capsule study  MEDICATIONS PRESCRIBED:  Take your iron pill once a day   SPECIAL INSTRUCTIONS/FOLLOW-UP: Labs in 3 months Dr. Tressie Stalker after labs  Thank you for choosing [facility] to provide your oncology and hematology care.  To afford each patient quality time with our providers, please arrive at least 15 minutes before your scheduled appointment time.  With your help, our goal is to use those 15 minutes to complete the necessary work-up to ensure our physicians have the information they need to help with your evaluation and healthcare recommendations.    Effective January 1st, 2014, we ask that you re-schedule your appointment with our physicians should you arrive 10 or more minutes late for your appointment.  We strive to give you quality time with our providers, and arriving late affects you and other patients whose appointments are after yours.    Again, thank you for choosing [facility].  Our hope is that these requests will decrease the amount of time that you wait before being seen by our physicians.       _____________________________________________________________  Should you have questions after your visit to [facility], please contact our office at [phone] between the hours of 8:30 a.m. and 5:00 p.m.  Voicemails left after 4:30 p.m. will not be returned until the following business day.  For prescription refill requests, have your pharmacy contact our office with your prescription refill request.

## 2013-02-08 NOTE — Progress Notes (Signed)
Patient not interested in small bowel given capsule study at this time.

## 2013-03-21 ENCOUNTER — Ambulatory Visit (INDEPENDENT_AMBULATORY_CARE_PROVIDER_SITE_OTHER): Payer: Medicare Other | Admitting: *Deleted

## 2013-03-21 VITALS — BP 124/84 | HR 90 | Ht 63.0 in | Wt 121.0 lb

## 2013-03-21 DIAGNOSIS — I4891 Unspecified atrial fibrillation: Secondary | ICD-10-CM | POA: Diagnosis not present

## 2013-03-21 NOTE — Progress Notes (Signed)
Returns for a bp check and ekg s/p discontinuing amiodarone on 2/10/  Denies complaints and states taking medications as prescribed.

## 2013-03-22 NOTE — Progress Notes (Signed)
Patient ID: Jane Andrews, female   DOB: 02/28/1925, 79 y.o.   MRN: [id number]  Normal sinus rhythm on EKG. Continue current medication with return visit as planned.

## 2013-04-11 ENCOUNTER — Ambulatory Visit (INDEPENDENT_AMBULATORY_CARE_PROVIDER_SITE_OTHER): Payer: Medicare Other | Admitting: Internal Medicine

## 2013-04-25 ENCOUNTER — Encounter (HOSPITAL_COMMUNITY): Payer: Medicare Other | Attending: Oncology

## 2013-04-25 DIAGNOSIS — D649 Anemia, unspecified: Secondary | ICD-10-CM | POA: Diagnosis not present

## 2013-04-25 DIAGNOSIS — D500 Iron deficiency anemia secondary to blood loss (chronic): Secondary | ICD-10-CM | POA: Diagnosis not present

## 2013-04-25 LAB — IRON AND TIBC
Iron: 82 ug/dL (ref 42–135)
TIBC: 380 ug/dL (ref 250–470)

## 2013-04-25 LAB — CBC
Platelets: 220 10*3/uL (ref 150–400)
RDW: 12.9 % (ref 11.5–15.5)
WBC: 7.4 10*3/uL (ref 4.0–10.5)

## 2013-04-25 NOTE — Progress Notes (Signed)
Labs drawn today for cbc,ferr,Iron and IBC 

## 2013-04-27 ENCOUNTER — Ambulatory Visit (HOSPITAL_COMMUNITY): Payer: Federal, State, Local not specified - PPO | Admitting: Oncology

## 2013-05-02 ENCOUNTER — Encounter (HOSPITAL_COMMUNITY): Payer: Self-pay

## 2013-05-04 ENCOUNTER — Ambulatory Visit (HOSPITAL_COMMUNITY): Payer: Federal, State, Local not specified - PPO | Admitting: Oncology

## 2013-05-30 ENCOUNTER — Encounter: Payer: Self-pay | Admitting: Cardiology

## 2013-05-30 ENCOUNTER — Ambulatory Visit (INDEPENDENT_AMBULATORY_CARE_PROVIDER_SITE_OTHER): Payer: Medicare Other | Admitting: Cardiology

## 2013-05-30 ENCOUNTER — Ambulatory Visit: Payer: Federal, State, Local not specified - PPO | Admitting: Cardiology

## 2013-05-30 VITALS — BP 131/83 | HR 92 | Ht 64.0 in | Wt 126.8 lb

## 2013-05-30 DIAGNOSIS — I10 Essential (primary) hypertension: Secondary | ICD-10-CM

## 2013-05-30 DIAGNOSIS — I4891 Unspecified atrial fibrillation: Secondary | ICD-10-CM

## 2013-05-30 NOTE — Progress Notes (Signed)
Patient ID: Jane Andrews. [place], female   DOB: [birth year], 79 y.o.   MRN: [id number]  HPI: Schedule return visit for atrial fibrillation. Since initial episode associated with physiologic stress, no recurrent arrhythmia has been documented. Patient has been asymptomatic, but was not aware of her initial arrhythmia. She is quite lively and reports no new significant medical problems except daytime somnolence for the past few months and increased bruising. She takes no aspirin nor aspirin-containing products, but does use ibuprofen on an as needed basis. She has no history of snoring and has never undergone a sleep study, but does take alprazolam 0.5 mg each bedtime when necessary, which she requires approximately 3 times per week.  Current Outpatient Prescriptions  Medication Sig Dispense Refill  . ALPRAZolam (XANAX) 1 MG tablet Take 0.5 mg by mouth daily as needed. Patient states that when she takes it she takes 1/2 of a 1 mg (anxiety/nerves)      . Cholecalciferol (VITAMIN D PO) Take 1,200 mg by mouth daily. Takes calcium and vitamin d      . Cyanocobalamin (VITAMIN B-12 IJ) Inject as directed every 30 (thirty) days.      . DULoxetine (CYMBALTA) 60 MG capsule Take 60 mg by mouth daily.      Marland Kitchen esomeprazole (NEXIUM) 20 MG capsule Take 20 mg by mouth daily before breakfast.      . glucosamine-chondroitin 500-400 MG tablet Take 1 tablet by mouth daily.      . iron polysaccharides (NIFEREX) 150 MG capsule Take 150 mg by mouth once.       . Lactobacillus (ACIDOPHILUS) 100 MG CAPS Take 100 mg by mouth daily.       Marland Kitchen levothyroxine (SYNTHROID, LEVOTHROID) 100 MCG tablet Take 100 mcg by mouth daily.       . Multiple Vitamin (MULTIVITAMIN) tablet Take 1 tablet by mouth daily.      . Omega-3 Fatty Acids (FISH OIL) 1000 MG CAPS Take 1,000 mg by mouth daily.       Marland Kitchen oxyCODONE (OXYCONTIN) 10 MG 12 hr tablet Take 10 mg by mouth as needed.      . sodium bicarbonate 650 MG tablet Take 1 tablet (650 mg total) by  mouth 2 (two) times daily.  30 tablet  0   No current facility-administered medications for this visit.   Allergies  Allergen Reactions  . Codeine Nausea And Vomiting  Past medical history, social history, and family history reviewed and updated.  ROS: Denies chest pain, palpitations, dyspnea, orthopnea, lightheadedness or syncope. All other systems reviewed and are negative.  PHYSICAL EXAM: BP 131/83  Pulse 92  Ht 5\' 4"  (1.626 m)  Wt 57.493 kg (126 lb 12 oz)  BMI 21.75 kg/m2;  Body mass index is 21.75 kg/(m^2). General-Well developed; no acute distress Body habitus-proportionate weight and height Neck-No JVD; minimal left carotid bruit Lungs-clear lung fields; resonant to percussion Cardiovascular-normal PMI; normal S1 and S2; regular rhythm; modest basilar systolic ejection murmur Abdomen-normal bowel sounds; soft and non-tender without masses or organomegaly Musculoskeletal-No deformities, no cyanosis or clubbing Neurologic-Normal cranial nerves; symmetric strength and tone Skin-Warm, no significant lesions; multiple small contusions of the upper and lower extremities  Extremities-distal pulses intact; no edema  EKG: Normal sinus rhythm; low-voltage in the chest leads; no previous tracing for comparison  Jacqulyn Ducking, MD 05/30/2013  3:07 PM  ASSESSMENT AND PLAN

## 2013-05-30 NOTE — Assessment & Plan Note (Signed)
Improved after initiation of treatment with iron.

## 2013-05-30 NOTE — Assessment & Plan Note (Signed)
Patient was euthyroid 6 months ago. I doubt that thyroid abnormalities account for any of her current symptoms.

## 2013-05-30 NOTE — Assessment & Plan Note (Signed)
Anemia has resolved with iron replacement therapy.

## 2013-05-30 NOTE — Progress Notes (Deleted)
Name: Jane Andrews. Jane Andrews    DOB: July 07, 1925  Age: 78 y.o.  MR#: [id number]       PCP:  Leonides Grills, MD      Insurance: Payor: MEDICARE / Plan: MEDICARE PART A AND B / Product Type: *No Product type* /   CC:   No chief complaint on file. PT STOPPED TAKING PROTONIX PER ON HOLD, STARTED NEXIUM OTC 20MG  ONCE DAILY  NO LIST  VS Filed Vitals:   05/30/13 1438  BP: 131/83  Pulse: 92  Height: 5\' 4"  (1.626 m)  Weight: 126 lb 12 oz (57.493 kg)    Weights Current Weight  05/30/13 126 lb 12 oz (57.493 kg)  03/21/13 121 lb (54.885 kg)  02/02/13 116 lb (52.617 kg)    Blood Pressure  BP Readings from Last 3 Encounters:  05/30/13 131/83  03/21/13 124/84  02/02/13 121/77     Admit date:  (Not on file) Last encounter with RMR:  05/30/2013   Allergy Codeine  Current Outpatient Prescriptions  Medication Sig Dispense Refill  . ALPRAZolam (XANAX) 1 MG tablet Take 0.5 mg by mouth daily as needed. Patient states that when she takes it she takes 1/2 of a 1 mg (anxiety/nerves)      . Cholecalciferol (VITAMIN D PO) Take 1,200 mg by mouth daily. Takes calcium and vitamin d      . Cyanocobalamin (VITAMIN B-12 IJ) Inject as directed every 30 (thirty) days.      . DULoxetine (CYMBALTA) 60 MG capsule Take 60 mg by mouth daily.      Marland Kitchen esomeprazole (NEXIUM) 20 MG capsule Take 20 mg by mouth daily before breakfast.      . glucosamine-chondroitin 500-400 MG tablet Take 1 tablet by mouth daily.      . iron polysaccharides (NIFEREX) 150 MG capsule Take 150 mg by mouth once.       . Lactobacillus (ACIDOPHILUS) 100 MG CAPS Take 100 mg by mouth daily.       Marland Kitchen levothyroxine (SYNTHROID, LEVOTHROID) 100 MCG tablet Take 100 mcg by mouth daily.       . Multiple Vitamin (MULTIVITAMIN) tablet Take 1 tablet by mouth daily.      . Omega-3 Fatty Acids (FISH OIL) 1000 MG CAPS Take 1,000 mg by mouth daily.       Marland Kitchen oxyCODONE (OXYCONTIN) 10 MG 12 hr tablet Take 10 mg by mouth as needed.      . sodium bicarbonate 650  MG tablet Take 1 tablet (650 mg total) by mouth 2 (two) times daily.  30 tablet  0   No current facility-administered medications for this visit.    Discontinued Meds:    Medications Discontinued During This Encounter  Medication Reason  . alendronate (FOSAMAX) 70 MG tablet Side effect (s)  . aspirin 81 MG tablet Side effect (s)  . amiodarone (PACERONE) 200 MG tablet Error  . pantoprazole (PROTONIX) 40 MG tablet Error    Patient Active Problem List   Diagnosis Date Noted  . Hypothyroid   . Atrial fibrillation   . Breast carcinoma   . Malabsorption   . CKD (chronic kidney disease) stage 3, GFR 30-59 ml/min 10/21/2012  . Anemia, normocytic normochromic 07/06/2012  . Chronic diarrhea 02/10/2012  . Intestinal bacterial overgrowth 02/10/2012  . Hypertension 02/10/2012  . Osteoporosis 02/10/2012    LABS    Component Value Date/Time   NA 138 01/11/2013 1515   NA 139 10/24/2012 0507   NA 145 10/23/2012 0454   K 4.1  01/11/2013 1515   K 4.1 10/24/2012 0507   K 3.3* 10/23/2012 0454   CL 103 01/11/2013 1515   CL 107 10/24/2012 0507   CL 113* 10/23/2012 0454   CO2 23 01/11/2013 1515   CO2 25 10/24/2012 0507   CO2 22 10/23/2012 0454   GLUCOSE 102* 01/11/2013 1515   GLUCOSE 84 10/24/2012 0507   GLUCOSE 91 10/23/2012 0454   BUN 30* 01/11/2013 1515   BUN 24* 10/24/2012 0507   BUN 24* 10/23/2012 0454   CREATININE 1.26* 01/11/2013 1515   CREATININE 1.40* 10/24/2012 0507   CREATININE 1.25* 10/23/2012 0454   CREATININE 1.42* 05/27/2012 0900   CALCIUM 10.2 01/11/2013 1515   CALCIUM 8.4 10/24/2012 0507   CALCIUM 8.5 10/23/2012 0454   GFRNONAA 38* 01/11/2013 1515   GFRNONAA 33* 10/24/2012 0507   GFRNONAA 38* 10/23/2012 0454   GFRAA 44* 01/11/2013 1515   GFRAA 39* 10/24/2012 0507   GFRAA 44* 10/23/2012 0454   CMP     Component Value Date/Time   NA 138 01/11/2013 1515   K 4.1 01/11/2013 1515   CL 103 01/11/2013 1515   CO2 23 01/11/2013 1515   GLUCOSE 102* 01/11/2013 1515   BUN 30* 01/11/2013 1515    CREATININE 1.26* 01/11/2013 1515   CREATININE 1.42* 05/27/2012 0900   CALCIUM 10.2 01/11/2013 1515   PROT 7.1 01/11/2013 1515   ALBUMIN 3.8 01/11/2013 1515   AST 14 01/11/2013 1515   ALT 20 01/11/2013 1515   ALKPHOS 62 01/11/2013 1515   BILITOT 0.3 01/11/2013 1515   GFRNONAA 38* 01/11/2013 1515   GFRAA 44* 01/11/2013 1515       Component Value Date/Time   WBC 7.4 04/25/2013 0901   WBC 8.9 01/11/2013 1515   WBC 7.2 11/22/2012 0915   HGB 12.0 04/25/2013 0901   HGB 11.4* 01/11/2013 1515   HGB 9.8* 12/23/2012 1410   HCT 36.2 04/25/2013 0901   HCT 34.5* 01/11/2013 1515   HCT 30.2* 12/23/2012 1410   MCV 88.1 04/25/2013 0901   MCV 86.3 01/11/2013 1515   MCV 86.2 11/22/2012 0915    Lipid Panel  No results found for this basename: chol, trig, hdl, cholhdl, vldl, ldlcalc    ABG    Component Value Date/Time   TCO2 18 09/17/2012 1157     Lab Results  Component Value Date   TSH 1.078 10/21/2012   BNP (last 3 results) No results found for this basename: PROBNP,  in the last 8760 hours Cardiac Panel (last 3 results) No results found for this basename: CKTOTAL, CKMB, TROPONINI, RELINDX,  in the last 72 hours  Iron/TIBC/Ferritin    Component Value Date/Time   IRON 82 04/25/2013 0901   TIBC 380 04/25/2013 0901   FERRITIN 144 04/25/2013 0901     EKG Orders placed in visit on 05/30/13  . EKG 12-LEAD     Prior Assessment and Plan Problem List as of 05/30/2013   Chronic diarrhea   Intestinal bacterial overgrowth   Hypertension   Last Assessment & Plan   01/24/2013 Office Visit Written 01/24/2013  2:14 PM by Yehuda Savannah, MD     Blood pressure remains normal despite the absence of antihypertensive medication.  She appears to have borderline hypertension without current need for pharmacologic therapy.    Osteoporosis   Anemia, normocytic normochromic   Last Assessment & Plan   01/24/2013 Office Visit Written 01/24/2013  2:20 PM by Yehuda Savannah, MD     Minimal anemia persists, now 3  months  following hospitalization and transfusion. An initial episode possibly represented subacute GI blood loss, but no precipitating cause was apparent by upper and lower endoscopy and iron studies were nondiagnostic. In any case, she does not appear to have significant impairment of hematopoiesis or continuing excessive blood loss or destruction. A return visit with Dr. Tressie Stalker is anticipated within the next few weeks.    CKD (chronic kidney disease) stage 3, GFR 30-59 ml/min   Last Assessment & Plan   01/24/2013 Office Visit Written 01/24/2013  2:21 PM by Yehuda Savannah, MD     Renal function has been stable since 10/2012, and renal status is relatively good in light of patient's advanced age.    Hypothyroid   Atrial fibrillation   Last Assessment & Plan   01/24/2013 Office Visit Written 01/24/2013  2:13 PM by Yehuda Savannah, MD     No evidence for recurrent atrial arrhythmias. We will continue to treat only with aspirin as an antithrombotic agent. Efficacy of amiodarone cannot be assessed, and this agent will be discontinued until and unless atrial fibrillation recurs.    Breast carcinoma   Malabsorption       Imaging: No results found.

## 2013-05-30 NOTE — Assessment & Plan Note (Signed)
No blood pressure elevation despite the absence of antihypertensive medication. She will continue to monitor blood pressure. Pharmacologic therapy can be reinstituted if needed.

## 2013-05-30 NOTE — Patient Instructions (Addendum)
Your physician recommends that you schedule a follow-up appointment in: AS NEEDED  Your physician recommends that you return for lab work in: TODAY ([facility])

## 2013-05-30 NOTE — Assessment & Plan Note (Signed)
No recurrent atrial arrhythmias documented. Patient currently receiving no pharmacologic agents for treatment of her arrhythmia. She does not require a routine return cardiology appointment, but we will be happy to reassess this nice woman should additional cardiac problems arise.

## 2013-05-31 ENCOUNTER — Encounter: Payer: Self-pay | Admitting: Cardiology

## 2013-05-31 ENCOUNTER — Encounter: Payer: Self-pay | Admitting: *Deleted

## 2013-05-31 DIAGNOSIS — R233 Spontaneous ecchymoses: Secondary | ICD-10-CM | POA: Insufficient documentation

## 2013-05-31 LAB — APTT: aPTT: 33 seconds (ref 24–37)

## 2013-05-31 LAB — PROTIME-INR: INR: 0.88 (ref <1.50)

## 2013-06-10 ENCOUNTER — Encounter (HOSPITAL_COMMUNITY): Payer: Medicare Other | Attending: Oncology | Admitting: Oncology

## 2013-06-10 ENCOUNTER — Encounter (HOSPITAL_COMMUNITY): Payer: Self-pay | Admitting: Oncology

## 2013-06-10 VITALS — BP 118/69 | HR 106 | Temp 98.5°F | Resp 18 | Wt 125.2 lb

## 2013-06-10 DIAGNOSIS — D649 Anemia, unspecified: Secondary | ICD-10-CM | POA: Diagnosis not present

## 2013-06-10 NOTE — Patient Instructions (Addendum)
[facility] Discharge Instructions  RECOMMENDATIONS MADE BY THE CONSULTANT AND ANY TEST RESULTS WILL BE SENT TO YOUR REFERRING PHYSICIAN.  EXAM FINDINGS BY THE PHYSICIAN TODAY AND SIGNS OR SYMPTOMS TO REPORT TO CLINIC OR PRIMARY PHYSICIAN: Exam and discussion by MD.  MEDICATIONS PRESCRIBED:  Decrease iron pill to three times weekly.  INSTRUCTIONS GIVEN AND DISCUSSED: Report shortness of breath, increased fatigue, increased ice intake.  SPECIAL INSTRUCTIONS/FOLLOW-UP: Follow-up and blood work in 6 months.  Thank you for choosing [facility] to provide your oncology and hematology care.  To afford each patient quality time with our providers, please arrive at least 15 minutes before your scheduled appointment time.  With your help, our goal is to use those 15 minutes to complete the necessary work-up to ensure our physicians have the information they need to help with your evaluation and healthcare recommendations.    Effective January 1st, 2014, we ask that you re-schedule your appointment with our physicians should you arrive 10 or more minutes late for your appointment.  We strive to give you quality time with our providers, and arriving late affects you and other patients whose appointments are after yours.    Again, thank you for choosing [facility].  Our hope is that these requests will decrease the amount of time that you wait before being seen by our physicians.       _____________________________________________________________  Should you have questions after your visit to [facility], please contact our office at [phone] between the hours of 8:30 a.m. and 5:00 p.m.  Voicemails left after 4:30 p.m. will not be returned until the following business day.  For prescription refill requests, have your pharmacy contact our office with your prescription refill request.

## 2013-06-10 NOTE — Progress Notes (Signed)
This had a normocytic anemia and interestingly iron deficiency and she has normalized with oral iron replacement. She did have 1 positive stool and still sees Dr Laural Golden and will need in my opinion consideration of capsule camera study. She sees him in July.  Her labs are fantastic and I have instructed her to decrease her iron to 3 X per week.  She will return in 6 months for f/u.

## 2013-06-14 DIAGNOSIS — H5200 Hypermetropia, unspecified eye: Secondary | ICD-10-CM | POA: Diagnosis not present

## 2013-06-14 DIAGNOSIS — H35369 Drusen (degenerative) of macula, unspecified eye: Secondary | ICD-10-CM | POA: Diagnosis not present

## 2013-06-14 DIAGNOSIS — H35379 Puckering of macula, unspecified eye: Secondary | ICD-10-CM | POA: Diagnosis not present

## 2013-06-14 DIAGNOSIS — Z961 Presence of intraocular lens: Secondary | ICD-10-CM | POA: Diagnosis not present

## 2013-06-27 ENCOUNTER — Ambulatory Visit (INDEPENDENT_AMBULATORY_CARE_PROVIDER_SITE_OTHER): Payer: Medicare Other | Admitting: Internal Medicine

## 2013-06-27 ENCOUNTER — Encounter (INDEPENDENT_AMBULATORY_CARE_PROVIDER_SITE_OTHER): Payer: Self-pay | Admitting: Internal Medicine

## 2013-06-27 VITALS — BP 118/78 | HR 76 | Temp 98.6°F | Resp 16 | Ht 64.0 in | Wt 125.6 lb

## 2013-06-27 DIAGNOSIS — D509 Iron deficiency anemia, unspecified: Secondary | ICD-10-CM

## 2013-06-27 DIAGNOSIS — K6389 Other specified diseases of intestine: Secondary | ICD-10-CM | POA: Diagnosis not present

## 2013-06-27 MED ORDER — POLYSACCHARIDE IRON COMPLEX 150 MG PO CAPS: 150.0000 mg | ORAL_CAPSULE | ORAL | Status: DC

## 2013-06-27 NOTE — Progress Notes (Signed)
Presenting complaint;  Followup for iron deficiency anemia and heme positive stools. History of diarrhea secondary to intestinal bacterial overgrowth.  Subjective:  Patient is a 79-year-old Caucasian female who presents for scheduled visit. She was last seen in November 2013 for iron deficiency anemia and heme-positive stool. On 11/22/2012 he underwent EGD and a colonoscopy. EGD was normal. Colonoscopy revealed few diverticula and sigmoid colon external hemorrhoids. She has been maintained on iron. She had H&H when she saw Dr. Lattie Haw in May and her hemoglobin was 12 g. She feels well. She denies heartburn nausea vomiting abdominal pain melena or rectal bleeding. Lately she has not had diarrhea and therefore has not required antibiotic therapy. She has good appetite. She had 2 injections in November last year for her back pain with complete resolution of pain. Jane Andrews states she stopped Fosamax over a month ago because she was having jaw discomfort.  Current Medications: Current Outpatient Prescriptions  Medication Sig Dispense Refill  . ALPRAZolam (XANAX) 0.25 MG tablet Take 0.25 mg by mouth at bedtime as needed for sleep.      . Cholecalciferol (VITAMIN D PO) Take 1,200 mg by mouth daily. Takes calcium and vitamin d      . Cyanocobalamin (VITAMIN B-12 IJ) Inject as directed every 30 (thirty) days.      . DULoxetine (CYMBALTA) 60 MG capsule Take 60 mg by mouth daily.      Marland Kitchen esomeprazole (NEXIUM) 20 MG capsule Take 20 mg by mouth daily before breakfast.      . glucosamine-chondroitin 500-400 MG tablet Take 1 tablet by mouth daily.      . iron polysaccharides (NIFEREX) 150 MG capsule Take 150 mg by mouth once.       . Lactobacillus (ACIDOPHILUS) 100 MG CAPS Take 100 mg by mouth daily.       Marland Kitchen levothyroxine (SYNTHROID, LEVOTHROID) 100 MCG tablet Take 100 mcg by mouth daily.       . Multiple Vitamin (MULTIVITAMIN) tablet Take 1 tablet by mouth daily.      . Omega-3 Fatty Acids (FISH OIL) 1000 MG  CAPS Take 1,000 mg by mouth daily.        No current facility-administered medications for this visit.     Objective: Blood pressure 118/78, pulse 76, temperature 98.6 F (37 C), temperature source Oral, resp. rate 16, height 5\' 4"  (1.626 m), weight 125 lb 9.6 oz (56.972 kg). Patient is alert and in no acute distress. Conjunctiva is pink. Sclera is nonicteric Oropharyngeal mucosa is normal. No neck masses or thyromegaly noted. Cardiac exam with regular rhythm normal S1 and S2. Grade 2/6 systolic ejection murmur at LLSB.  Lungs are clear to auscultation. Abdomen is symmetrical with long midline scar. All sounds are normal. No tenderness organomegaly or masses noted.  No LE edema or clubbing noted.  Labs/studies Results: Lab data from 04/25/2013. H&H 12 and 36.2 and MCV 88.1 Serum iron 82 TIBC 380 saturation 22% Serum ferritin 144.    Assessment:  #1. History of iron deficiency anemia. Anemia has fully corrected with iron therapy. No lesion was found on EGD and colonoscopy in December 2013. She could have small bowel AV malformation or malabsorption secondary to short gut. No further workup unless problem recurs. #2. History of diarrhea secondary to intestinal bacterial overgrowth. She has not required antibiotic in over a year.   Plan:  Continue Niferex-150 the times a week. Hemoccult x1. Office visit in one year.

## 2013-06-27 NOTE — Patient Instructions (Signed)
Notify if you have tarry stools or diarrhea relapses. Hemoccult x1

## 2013-06-29 DIAGNOSIS — D518 Other vitamin B12 deficiency anemias: Secondary | ICD-10-CM | POA: Diagnosis not present

## 2013-07-25 ENCOUNTER — Encounter (HOSPITAL_COMMUNITY): Payer: Self-pay | Admitting: Emergency Medicine

## 2013-07-25 ENCOUNTER — Emergency Department (HOSPITAL_COMMUNITY): Payer: Medicare Other

## 2013-07-25 ENCOUNTER — Emergency Department (HOSPITAL_COMMUNITY)
Admission: EM | Admit: 2013-07-25 | Discharge: 2013-07-25 | Disposition: A | Discharge status: Home / Self Care | Payer: Medicare Other | Attending: Emergency Medicine | Admitting: Emergency Medicine

## 2013-07-25 DIAGNOSIS — K8020 Calculus of gallbladder without cholecystitis without obstruction: Secondary | ICD-10-CM | POA: Diagnosis not present

## 2013-07-25 DIAGNOSIS — I4891 Unspecified atrial fibrillation: Secondary | ICD-10-CM | POA: Insufficient documentation

## 2013-07-25 DIAGNOSIS — Z87891 Personal history of nicotine dependence: Secondary | ICD-10-CM | POA: Diagnosis not present

## 2013-07-25 DIAGNOSIS — Z853 Personal history of malignant neoplasm of breast: Secondary | ICD-10-CM | POA: Insufficient documentation

## 2013-07-25 DIAGNOSIS — Z9889 Other specified postprocedural states: Secondary | ICD-10-CM | POA: Diagnosis not present

## 2013-07-25 DIAGNOSIS — F341 Dysthymic disorder: Secondary | ICD-10-CM | POA: Insufficient documentation

## 2013-07-25 DIAGNOSIS — K219 Gastro-esophageal reflux disease without esophagitis: Secondary | ICD-10-CM | POA: Diagnosis not present

## 2013-07-25 DIAGNOSIS — Z9049 Acquired absence of other specified parts of digestive tract: Secondary | ICD-10-CM | POA: Diagnosis not present

## 2013-07-25 DIAGNOSIS — Z79899 Other long term (current) drug therapy: Secondary | ICD-10-CM | POA: Diagnosis not present

## 2013-07-25 DIAGNOSIS — Z8719 Personal history of other diseases of the digestive system: Secondary | ICD-10-CM | POA: Diagnosis not present

## 2013-07-25 DIAGNOSIS — Z90710 Acquired absence of both cervix and uterus: Secondary | ICD-10-CM | POA: Diagnosis not present

## 2013-07-25 DIAGNOSIS — Z87442 Personal history of urinary calculi: Secondary | ICD-10-CM | POA: Insufficient documentation

## 2013-07-25 DIAGNOSIS — E039 Hypothyroidism, unspecified: Secondary | ICD-10-CM | POA: Insufficient documentation

## 2013-07-25 DIAGNOSIS — K567 Ileus, unspecified: Secondary | ICD-10-CM

## 2013-07-25 DIAGNOSIS — N200 Calculus of kidney: Secondary | ICD-10-CM | POA: Diagnosis not present

## 2013-07-25 DIAGNOSIS — K560 Paralytic ileus: Secondary | ICD-10-CM | POA: Insufficient documentation

## 2013-07-25 DIAGNOSIS — I10 Essential (primary) hypertension: Secondary | ICD-10-CM | POA: Insufficient documentation

## 2013-07-25 LAB — COMPREHENSIVE METABOLIC PANEL
AST: 16 U/L (ref 0–37)
Alkaline Phosphatase: 71 U/L (ref 39–117)
CO2: 24 mEq/L (ref 19–32)
Chloride: 100 mEq/L (ref 96–112)
Creatinine, Ser: 1.46 mg/dL — ABNORMAL HIGH (ref 0.50–1.10)
GFR calc non Af Amer: 32 mL/min — ABNORMAL LOW (ref >90)
Potassium: 4.2 mEq/L (ref 3.5–5.1)
Total Bilirubin: 0.2 mg/dL — ABNORMAL LOW (ref 0.3–1.2)

## 2013-07-25 LAB — URINALYSIS, ROUTINE W REFLEX MICROSCOPIC
Bilirubin Urine: NEGATIVE
Glucose, UA: NEGATIVE mg/dL
Ketones, ur: NEGATIVE mg/dL
Leukocytes, UA: NEGATIVE
Nitrite: NEGATIVE
Protein, ur: NEGATIVE mg/dL
Specific Gravity, Urine: 1.01 (ref 1.005–1.030)
Urobilinogen, UA: 0.2 mg/dL (ref 0.0–1.0)
pH: 5.5 (ref 5.0–8.0)

## 2013-07-25 LAB — URINE MICROSCOPIC-ADD ON

## 2013-07-25 LAB — CBC WITH DIFFERENTIAL/PLATELET
Basophils Absolute: 0 10*3/uL (ref 0.0–0.1)
HCT: 36.6 % (ref 36.0–46.0)
Hemoglobin: 12.2 g/dL (ref 12.0–15.0)
Lymphocytes Relative: 27 % (ref 12–46)
Monocytes Absolute: 0.9 10*3/uL (ref 0.1–1.0)
Monocytes Relative: 11 % (ref 3–12)
Neutro Abs: 4.4 10*3/uL (ref 1.7–7.7)
Neutrophils Relative %: 56 % (ref 43–77)
RDW: 13.5 % (ref 11.5–15.5)
WBC: 8 10*3/uL (ref 4.0–10.5)

## 2013-07-25 MED ORDER — IOHEXOL 300 MG/ML  SOLN
80.0000 mL | Freq: Once | INTRAMUSCULAR | Status: AC | PRN
  Administered 2013-07-25: 80 mL via INTRAVENOUS

## 2013-07-25 MED ORDER — SODIUM CHLORIDE 0.9 % IV SOLN
Freq: Once | INTRAVENOUS | Status: AC
  Administered 2013-07-25: 20:00:00 via INTRAVENOUS

## 2013-07-25 MED ORDER — IOHEXOL 300 MG/ML  SOLN
50.0000 mL | Freq: Once | INTRAMUSCULAR | Status: AC | PRN
  Administered 2013-07-25: 50 mL via ORAL

## 2013-07-25 MED ORDER — ONDANSETRON HCL 4 MG/2ML IJ SOLN: 4.0000 mg | Freq: Once | INTRAMUSCULAR | Status: DC

## 2013-07-25 NOTE — ED Notes (Signed)
nad noted prior to dc. Dc instructions reviewed with pt. F/u instructions reviewed and pt voiced understanding.

## 2013-07-25 NOTE — ED Provider Notes (Signed)
CSN: [id number]     Arrival date & time 07/25/13  1935 History  This chart was scribed for Maudry Diego, MD, by Neta Ehlers, ED Scribe. This patient was seen in room APA14/APA14 and the patient's care was started at 7:51 PM.   First MD Initiated Contact with Patient 07/25/13 1950     Chief Complaint  Patient presents with  . Abdominal Pain    Patient is a 79 y.o. female presenting with abdominal pain. The history is provided by the patient. No language interpreter was used.  Abdominal Pain Pain location:  Periumbilical Pain radiates to:  Does not radiate Pain severity:  Severe Onset quality:  Sudden Duration:  5 hours Timing:  Constant Progression:  Unchanged Relieved by:  Nothing Worsened by:  Nothing tried Associated symptoms: no nausea and no vomiting  Flatus: Feels like she will, but has not yet.     HPI Comments: Jane Andrews is a 79 y.o. female who presents to the Emergency Department complaining of sudden-onset, constant abdominal pain which began five hours ago which she ranks as a 8/10. She denies experiencing any nausea or emesis. The pt has a h/o similar symptoms. The pt's most recent hospital admission for similar symptoms was January 2014. In 1993, she had 18 feet of her small intestines removed; the surgery was performed by Dr. Milbert Coulter. She denies any subsequent surgeries. The pt is a former smoker, and she denies alcohol use.  Past Medical History  Diagnosis Date  . Hypertension   . Hypothyroid   . Abdominal adhesions 1994  . Atrial fibrillation 10/2012    Associated with severe anemia and esophageal pill impaction  . Gastroesophageal reflux disease     Hiatal hernia  . Breast carcinoma     right mastectomy  . Anxiety and depression   . Cholelithiasis     asymptomatic  . Nephrolithiasis 2004    painless hematuria  . Upper GI bleed 2004    Multiple episodes of melena-? due to gastritis or adverse drug effect (nonsteroidals, small bowel ulceration  with Fosamax); caused by Pepto-Bismol during one Emergency Department evaluation  . Malabsorption     Short gut syndrome following small bowel resection surgery x2   Past Surgical History  Procedure Laterality Date  . Abdominal hysterectomy    . Bowel resection      Resulting short gut syndrome  . Colonoscopy w/ polypectomy  2005    Lipoma; diverticulosis  . Upper gastrointestinal endoscopy    . Mastectomy  right breast  . Colonoscopy with esophagogastroduodenoscopy (egd)  11/22/2012    [facility]  . Mastectomy      Carcinoma of the breast; right  . Abdominal hysterectomy  1960    massive gynecologic bleeding  . Laparoscopic lysis of adhesions  1965   Family History  Problem Relation Age of Onset  . Anuerysm Father   . Rheum arthritis Sister   . Healthy Sister   . COPD Sister   . Healthy Brother   . Cancer    . Colon cancer Neg Hx    History  Substance Use Topics  . Smoking status: Former Smoker -- 1.50 packs/day for 20 years    Types: Cigarettes  . Smokeless tobacco: Never Used  . Alcohol Use: No   No OB history provided.  Review of Systems  Gastrointestinal: Positive for abdominal pain. Negative for nausea and vomiting. Flatus: Feels like she will, but has not yet.   All other systems reviewed and are negative.  Allergies  Codeine  Home Medications   Current Outpatient Rx  Name  Route  Sig  Dispense  Refill  . ALPRAZolam (XANAX) 0.25 MG tablet   Oral   Take 0.25 mg by mouth at bedtime as needed for sleep.         . Cholecalciferol (VITAMIN D PO)   Oral   Take 1,200 mg by mouth daily. Takes calcium and vitamin d         . Cyanocobalamin (VITAMIN B-12 IJ)   Injection   Inject as directed every 30 (thirty) days.         . DULoxetine (CYMBALTA) 60 MG capsule   Oral   Take 60 mg by mouth daily.         Marland Kitchen esomeprazole (NEXIUM) 20 MG capsule   Oral   Take 20 mg by mouth daily before breakfast.         . glucosamine-chondroitin 500-400 MG  tablet   Oral   Take 1 tablet by mouth daily.         . iron polysaccharides (NIFEREX) 150 MG capsule   Oral   Take 1 capsule (150 mg total) by mouth every Monday, Wednesday, and Friday.   30 capsule   5   . Lactobacillus (ACIDOPHILUS) 100 MG CAPS   Oral   Take 100 mg by mouth daily.          Marland Kitchen levothyroxine (SYNTHROID, LEVOTHROID) 100 MCG tablet   Oral   Take 100 mcg by mouth daily.          . Multiple Vitamin (MULTIVITAMIN) tablet   Oral   Take 1 tablet by mouth daily.         . Omega-3 Fatty Acids (FISH OIL) 1000 MG CAPS   Oral   Take 1,000 mg by mouth daily.           Triage Vitals: BP 143/66  Pulse 88  Temp(Src) 98.3 F (36.8 C) (Oral)  Resp 16  Ht 5\' 4"  (1.626 m)  Wt 125 lb (56.7 kg)  BMI 21.45 kg/m2  SpO2 100%  Physical Exam  Nursing note and vitals reviewed. Constitutional: She is oriented to person, place, and time. She appears well-developed.  HENT:  Head: Normocephalic and atraumatic.  Eyes: Conjunctivae and EOM are normal. No scleral icterus.  Neck: Neck supple. No thyromegaly present.  Cardiovascular: Normal rate, regular rhythm and normal heart sounds.  Exam reveals no gallop and no friction rub.   No murmur heard. Pulmonary/Chest: Effort normal and breath sounds normal. No stridor. No respiratory distress. She has no wheezes. She has no rales. She exhibits no tenderness.  Abdominal: Bowel sounds are normal. She exhibits no distension. There is tenderness. There is no rebound.  Moderate periumbilical tenderness.  Well-healed scar.  Bowel sounds present.   Musculoskeletal: Normal range of motion. She exhibits no edema.  Lymphadenopathy:    She has no cervical adenopathy.  Neurological: She is alert and oriented to person, place, and time. Coordination normal.  Skin: No rash noted. No erythema.  Psychiatric: She has a normal mood and affect. Her behavior is normal.    ED Course   DIAGNOSTIC STUDIES: Oxygen Saturation is 100% on room  air, normal by my interpretation.    COORDINATION OF CARE:  7:57 PM- Discussed treatment plan with patient, and the patient agreed to the plan.   10:37 PM- Rechecked pt. She said that her pain had improved, but her abdomen was still sore.   10:43  PM- Consulted with Dr. Arnoldo Morale, the surgeon. He agrees that the pt can be discharged home tonight with instructions to follow up.  Procedures (including critical care time)  Labs Reviewed  CBC WITH DIFFERENTIAL - Abnormal; Notable for the following:    Eosinophils Relative 6 (*)    All other components within normal limits  COMPREHENSIVE METABOLIC PANEL - Abnormal; Notable for the following:    Glucose, Bld 106 (*)    BUN 31 (*)    Creatinine, Ser 1.46 (*)    Total Bilirubin 0.2 (*)    GFR calc non Af Amer 32 (*)    GFR calc Af Amer 37 (*)    All other components within normal limits  URINALYSIS, ROUTINE W REFLEX MICROSCOPIC   No results found. No diagnosis found. Pt with no pain or nauseau on discharge.  Spoke with dr. Arnoldo Morale MDM  Ileus resolving  The chart was scribed for me under my direct supervision.  I personally performed the history, physical, and medical decision making and all procedures in the evaluation of this patient.Maudry Diego, MD 07/25/13 [phone]

## 2013-07-25 NOTE — ED Notes (Signed)
Pt denies nausea and does not wish for Zofran at this time

## 2013-07-25 NOTE — ED Notes (Signed)
Patient states that she feels a lot better. That she is ready to go home.

## 2013-07-25 NOTE — ED Notes (Signed)
Pt informed of urine sample needed.

## 2013-07-25 NOTE — ED Notes (Signed)
Pt c/o abd pain since 1400 today. Pt states she has hx of gastric obstructions and her last bm was this am.

## 2013-10-04 ENCOUNTER — Emergency Department (HOSPITAL_COMMUNITY)
Admission: EM | Admit: 2013-10-04 | Discharge: 2013-10-04 | Disposition: A | Discharge status: Home / Self Care | Payer: Medicare Other | Attending: Emergency Medicine | Admitting: Emergency Medicine

## 2013-10-04 ENCOUNTER — Encounter (HOSPITAL_COMMUNITY): Payer: Self-pay | Admitting: Emergency Medicine

## 2013-10-04 ENCOUNTER — Emergency Department (HOSPITAL_COMMUNITY): Payer: Medicare Other

## 2013-10-04 DIAGNOSIS — F3289 Other specified depressive episodes: Secondary | ICD-10-CM | POA: Insufficient documentation

## 2013-10-04 DIAGNOSIS — I10 Essential (primary) hypertension: Secondary | ICD-10-CM | POA: Diagnosis not present

## 2013-10-04 DIAGNOSIS — E039 Hypothyroidism, unspecified: Secondary | ICD-10-CM | POA: Insufficient documentation

## 2013-10-04 DIAGNOSIS — K219 Gastro-esophageal reflux disease without esophagitis: Secondary | ICD-10-CM | POA: Insufficient documentation

## 2013-10-04 DIAGNOSIS — Z853 Personal history of malignant neoplasm of breast: Secondary | ICD-10-CM | POA: Diagnosis not present

## 2013-10-04 DIAGNOSIS — Z87891 Personal history of nicotine dependence: Secondary | ICD-10-CM | POA: Insufficient documentation

## 2013-10-04 DIAGNOSIS — Z87442 Personal history of urinary calculi: Secondary | ICD-10-CM | POA: Diagnosis not present

## 2013-10-04 DIAGNOSIS — K529 Noninfective gastroenteritis and colitis, unspecified: Secondary | ICD-10-CM

## 2013-10-04 DIAGNOSIS — F411 Generalized anxiety disorder: Secondary | ICD-10-CM | POA: Diagnosis not present

## 2013-10-04 DIAGNOSIS — F329 Major depressive disorder, single episode, unspecified: Secondary | ICD-10-CM | POA: Insufficient documentation

## 2013-10-04 DIAGNOSIS — R5381 Other malaise: Secondary | ICD-10-CM | POA: Diagnosis not present

## 2013-10-04 DIAGNOSIS — Z79899 Other long term (current) drug therapy: Secondary | ICD-10-CM | POA: Diagnosis not present

## 2013-10-04 DIAGNOSIS — R109 Unspecified abdominal pain: Secondary | ICD-10-CM | POA: Diagnosis not present

## 2013-10-04 DIAGNOSIS — J438 Other emphysema: Secondary | ICD-10-CM | POA: Diagnosis not present

## 2013-10-04 DIAGNOSIS — K5289 Other specified noninfective gastroenteritis and colitis: Secondary | ICD-10-CM | POA: Diagnosis not present

## 2013-10-04 LAB — COMPREHENSIVE METABOLIC PANEL
ALT: 16 U/L (ref 0–35)
CO2: 20 mEq/L (ref 19–32)
Calcium: 9.1 mg/dL (ref 8.4–10.5)
Chloride: 104 mEq/L (ref 96–112)
GFR calc Af Amer: 50 mL/min — ABNORMAL LOW (ref >90)
GFR calc non Af Amer: 44 mL/min — ABNORMAL LOW (ref >90)
Glucose, Bld: 137 mg/dL — ABNORMAL HIGH (ref 70–99)
Sodium: 137 mEq/L (ref 135–145)
Total Bilirubin: 0.3 mg/dL (ref 0.3–1.2)

## 2013-10-04 LAB — URINALYSIS, ROUTINE W REFLEX MICROSCOPIC
Bilirubin Urine: NEGATIVE
Ketones, ur: NEGATIVE mg/dL
Nitrite: NEGATIVE
Protein, ur: NEGATIVE mg/dL
Specific Gravity, Urine: 1.015 (ref 1.005–1.030)
Urobilinogen, UA: 0.2 mg/dL (ref 0.0–1.0)

## 2013-10-04 LAB — CBC WITH DIFFERENTIAL/PLATELET
Eosinophils Relative: 1 % (ref 0–5)
HCT: 31.7 % — ABNORMAL LOW (ref 36.0–46.0)
Lymphocytes Relative: 6 % — ABNORMAL LOW (ref 12–46)
Lymphs Abs: 0.7 10*3/uL (ref 0.7–4.0)
MCV: 86.6 fL (ref 78.0–100.0)
Monocytes Absolute: 1.1 10*3/uL — ABNORMAL HIGH (ref 0.1–1.0)
Neutro Abs: 9.5 10*3/uL — ABNORMAL HIGH (ref 1.7–7.7)
RBC: 3.66 MIL/uL — ABNORMAL LOW (ref 3.87–5.11)
WBC: 11.4 10*3/uL — ABNORMAL HIGH (ref 4.0–10.5)

## 2013-10-04 LAB — URINE MICROSCOPIC-ADD ON

## 2013-10-04 MED ORDER — ONDANSETRON 8 MG PO TBDP: 8.0000 mg | ORAL_TABLET | Freq: Three times a day (TID) | ORAL | Status: DC | PRN

## 2013-10-04 MED ORDER — HYDROCODONE-ACETAMINOPHEN 5-325 MG PO TABS
1.0000 | ORAL_TABLET | Freq: Once | ORAL | Status: AC
  Administered 2013-10-04: 1 via ORAL
  Filled 2013-10-04: qty 1

## 2013-10-04 MED ORDER — SODIUM CHLORIDE 0.9 % IV BOLUS (SEPSIS)
500.0000 mL | Freq: Once | INTRAVENOUS | Status: AC
  Administered 2013-10-04: 500 mL via INTRAVENOUS

## 2013-10-04 NOTE — ED Notes (Signed)
Patient reports that she began having stomach ulcer pains last Tuesday. Patient has been trying to treat herself by eating soft foods and taking hydrocodone. Patient reports to have nausea, vomiting, and diarrhea. Patient states that the pain started out with a heartburn, with a "very sour mucous".  Denies pain at this time. Reports weakness and soreness "all over".

## 2013-10-04 NOTE — ED Provider Notes (Signed)
CSN: [id number]     Arrival date & time 10/04/13  1849 History   First MD Initiated Contact with Patient 10/04/13 1859     Chief Complaint  Patient presents with  . Fatigue   (Consider location/radiation/quality/duration/timing/severity/associated sxs/prior Treatment) HPI Comments: Jane Andrews is a 79 y.o. Female presenting with fatigue, myalgias and epigastric pain starting one week ago which she describes as burning discomfort with increased acid reflux for which she has been taking hydrocodone and trying to maintain a soft diet try to alleviate what she believes is acid reflux and "ulcer".  She denies abdominal pain but has had increased cramping and diarrhea which is a frequent condition for her since she underwent bowel resection due to multiple complications from adhesions.  She denies hematemesis, and her last episode of vomiting was 2 days ago.  Her diarrhea is non bloody, and has 3-4 episodes daily. She denies fevers or chills and has had no dysuria, no dizziness or lightheadedness,  Also denies chest pain or shortness of breath.  She has been maintaining a normal fluid intake.   The history is provided by the patient.    Past Medical History  Diagnosis Date  . Hypertension   . Hypothyroid   . Abdominal adhesions 1994  . Atrial fibrillation 10/2012    Associated with severe anemia and esophageal pill impaction  . Gastroesophageal reflux disease     Hiatal hernia  . Breast carcinoma     right mastectomy  . Anxiety and depression   . Cholelithiasis     asymptomatic  . Nephrolithiasis 2004    painless hematuria  . Upper GI bleed 2004    Multiple episodes of melena-? due to gastritis or adverse drug effect (nonsteroidals, small bowel ulceration with Fosamax); caused by Pepto-Bismol during one Emergency Department evaluation  . Malabsorption     Short gut syndrome following small bowel resection surgery x2   Past Surgical History  Procedure Laterality Date  .  Abdominal hysterectomy    . Bowel resection      Resulting short gut syndrome  . Colonoscopy w/ polypectomy  2005    Lipoma; diverticulosis  . Upper gastrointestinal endoscopy    . Mastectomy  right breast  . Colonoscopy with esophagogastroduodenoscopy (egd)  11/22/2012    [facility]  . Mastectomy      Carcinoma of the breast; right  . Abdominal hysterectomy  1960    massive gynecologic bleeding  . Laparoscopic lysis of adhesions  1965   Family History  Problem Relation Age of Onset  . Anuerysm Father   . Rheum arthritis Sister   . Healthy Sister   . COPD Sister   . Healthy Brother   . Cancer    . Colon cancer Neg Hx    History  Substance Use Topics  . Smoking status: Former Smoker -- 1.50 packs/day for 20 years    Types: Cigarettes  . Smokeless tobacco: Never Used  . Alcohol Use: No   OB History   Grav Para Term Preterm Abortions TAB SAB Ect Mult Living                 Review of Systems  Constitutional: Negative for fever and chills.  HENT: Negative for congestion and sore throat.   Eyes: Negative.   Respiratory: Negative for chest tightness and shortness of breath.   Cardiovascular: Negative for chest pain.  Gastrointestinal: Positive for nausea, vomiting and diarrhea. Negative for abdominal pain.  Genitourinary: Negative.   Musculoskeletal: Negative  for arthralgias, joint swelling and neck pain.  Skin: Negative.  Negative for rash and wound.  Neurological: Negative for dizziness, weakness, light-headedness, numbness and headaches.  Psychiatric/Behavioral: Negative.     Allergies  Codeine  Home Medications   Current Outpatient Rx  Name  Route  Sig  Dispense  Refill  . ALPRAZolam (XANAX) 1 MG tablet   Oral   Take 1 mg by mouth at bedtime as needed for sleep.         . Calcium Carbonate-Vitamin D (CALCIUM + D PO)   Oral   Take 1 tablet by mouth daily.         . Cholecalciferol (VITAMIN D PO)   Oral   Take 1,200 mg by mouth daily. Takes calcium and  vitamin d         . Cyanocobalamin (VITAMIN B-12 IJ)   Injection   Inject as directed every 30 (thirty) days.         . DULoxetine (CYMBALTA) 60 MG capsule   Oral   Take 60 mg by mouth every morning.          Marland Kitchen glucosamine-chondroitin 500-400 MG tablet   Oral   Take 1 tablet by mouth every morning.          Marland Kitchen HYDROcodone-acetaminophen (NORCO/VICODIN) 5-325 MG per tablet   Oral   Take 1 tablet by mouth daily as needed for pain.         . iron polysaccharides (NIFEREX) 150 MG capsule   Oral   Take 1 capsule (150 mg total) by mouth every Monday, Wednesday, and Friday.   30 capsule   5   . Lactobacillus (ACIDOPHILUS) 100 MG CAPS   Oral   Take 100 mg by mouth every morning.          Marland Kitchen levothyroxine (SYNTHROID, LEVOTHROID) 50 MCG tablet   Oral   Take 50 mcg by mouth every morning.         . Multiple Vitamin (MULTIVITAMIN) tablet   Oral   Take 1 tablet by mouth every morning.          . Omega-3 Fatty Acids (FISH OIL) 1000 MG CAPS   Oral   Take 1,000 mg by mouth every morning.          . pantoprazole (PROTONIX) 40 MG tablet   Oral   Take 40 mg by mouth daily.         . ondansetron (ZOFRAN ODT) 8 MG disintegrating tablet   Oral   Take 1 tablet (8 mg total) by mouth every 8 (eight) hours as needed for nausea.   15 tablet   0    BP 114/53  Pulse 86  Temp(Src) 97.5 F (36.4 C) (Oral)  Resp 21  Ht 5\' 4"  (1.626 m)  Wt 120 lb (54.432 kg)  BMI 20.59 kg/m2  SpO2 97% Physical Exam  Nursing note and vitals reviewed. Constitutional: She appears well-developed and well-nourished. No distress.  HENT:  Head: Normocephalic and atraumatic.  Eyes: Conjunctivae are normal.  Neck: Normal range of motion.  Cardiovascular: Normal rate, regular rhythm, normal heart sounds and intact distal pulses.   Pulmonary/Chest: Effort normal and breath sounds normal. She has no wheezes.  Abdominal: Soft. She exhibits no distension and no mass. Bowel sounds are increased.  There is no tenderness. There is no rebound and no guarding.  Well healed midline surgical incision.  Musculoskeletal: Normal range of motion.  Neurological: She is alert.  Skin: Skin is warm  and dry.  Psychiatric: She has a normal mood and affect.    ED Course  Procedures (including critical care time) Labs Review Labs Reviewed  CBC WITH DIFFERENTIAL - Abnormal; Notable for the following:    WBC 11.4 (*)    RBC 3.66 (*)    Hemoglobin 10.7 (*)    HCT 31.7 (*)    Platelets 142 (*)    Neutrophils Relative % 84 (*)    Neutro Abs 9.5 (*)    Lymphocytes Relative 6 (*)    Monocytes Absolute 1.1 (*)    All other components within normal limits  COMPREHENSIVE METABOLIC PANEL - Abnormal; Notable for the following:    Glucose, Bld 137 (*)    BUN 29 (*)    Creatinine, Ser 1.12 (*)    Albumin 3.0 (*)    GFR calc non Af Amer 44 (*)    GFR calc Af Amer 50 (*)    All other components within normal limits  URINALYSIS, ROUTINE W REFLEX MICROSCOPIC - Abnormal; Notable for the following:    Hgb urine dipstick MODERATE (*)    Leukocytes, UA TRACE (*)    All other components within normal limits  URINE MICROSCOPIC-ADD ON - Abnormal; Notable for the following:    Squamous Epithelial / LPF FEW (*)    Bacteria, UA FEW (*)    All other components within normal limits  URINE CULTURE  TROPONIN I   Imaging Review Dg Abd Acute W/chest  10/04/2013   CLINICAL DATA:  Body aches. Vomiting and diarrhea last week. Abdominal pain.  EXAM: ACUTE ABDOMEN SERIES (ABDOMEN 2 VIEW & CHEST 1 VIEW)  COMPARISON:  Chest 10/21/2012. Abdominal series 06/13/2012.  FINDINGS: Emphysematous changes and fibrosis in the lungs. Surgical clips in the right axilla. Previous right mastectomy. Calcified and tortuous aorta. Normal heart size and pulmonary vascularity. No focal airspace disease or consolidation in the lungs. No blunting of costophrenic angles. No pneumothorax. Mediastinal contours appear intact.  The calcifications  in the right upper quadrant likely represent renal stones. The largest of these is a staghorn type calculus measuring about 4.2 by 1.2 cm. The appearance is similar to previous study. Additional stones are demonstrated in the upper pole region of the right kidney. Stool-filled colon. No small or large bowel distention. No free intra-abdominal air. No abnormal air-fluid levels. Degenerative changes and scoliosis of the lumbar spine.  IMPRESSION: Emphysematous changes in the lungs. No evidence of active pulmonary disease. Large right renal calculi. Nonobstructive bowel gas pattern.   Electronically Signed   By: Lucienne Capers M.D.   On: 10/04/2013 21:28    EKG Interpretation   None       MDM   1. Gastroenteritis    Patients labs and/or radiological studies were viewed and considered during the medical decision making and disposition process. Pt was also seen by Dr Stark Jock.  Pt with gastroenteritis with symptoms of diarrhea which is chronic but increased.  She had no diarrhea or vomiting during this ed visit.  Her vitals and labs are stable.  She was given IV fluid 1 liter bolus.  She was also given 1 hydrocodone prior to dc home (for her chronic back pain), sister driving home.  She was encouraged f/u with her pcp for recheck this week,  Returning here sooner for any worsened sx.  The patient appears reasonably screened and/or stabilized for discharge and I doubt any other medical condition or other [facility] requiring further screening, evaluation, or treatment in the ED  at this time prior to discharge.     Evalee Jefferson, PA-C 10/04/13 2321

## 2013-10-04 NOTE — ED Provider Notes (Signed)
Medical screening examination/treatment/procedure(s) were conducted as a shared visit with non-physician practitioner(s) and myself.  I personally evaluated the patient during the encounter.  Patient is an 79 year old female presents to the emergency department with complaints of his several day history of fatigue, body aches, and diarrhea. He denies having any fevers or chills. She reports some epigastric discomfort however no severe abdominal pains.  On exam patient is afebrile vitals are stable. She is awake alert and appropriate. Heart is regular rate and rhythm without murmurs. Lungs are clear. The abdomen is soft nontender nondistended. Mucous membranes are moist and she appears well-hydrated.  Workup reveals a mildly elevated white count with essentially unremarkable electrolytes. Urinalysis is unremarkable and she appears to be feeling better with fluids and medications in the ED. There is no evidence for an acute abdomen and laboratory studies are reassuring. She is feeling better with interventions in the ED and I feel as though she is stable for discharge.  Veryl Speak, MD 10/05/13 0000

## 2013-10-06 ENCOUNTER — Ambulatory Visit (INDEPENDENT_AMBULATORY_CARE_PROVIDER_SITE_OTHER): Payer: Medicare Other | Admitting: Internal Medicine

## 2013-10-06 LAB — URINE CULTURE: Colony Count: 50000

## 2013-10-27 DIAGNOSIS — M5137 Other intervertebral disc degeneration, lumbosacral region: Secondary | ICD-10-CM | POA: Diagnosis not present

## 2013-10-27 DIAGNOSIS — M47817 Spondylosis without myelopathy or radiculopathy, lumbosacral region: Secondary | ICD-10-CM | POA: Diagnosis not present

## 2013-10-27 DIAGNOSIS — M4120 Other idiopathic scoliosis, site unspecified: Secondary | ICD-10-CM | POA: Diagnosis not present

## 2013-11-15 ENCOUNTER — Other Ambulatory Visit (INDEPENDENT_AMBULATORY_CARE_PROVIDER_SITE_OTHER): Payer: Self-pay | Admitting: Internal Medicine

## 2013-11-30 DIAGNOSIS — E539 Vitamin B deficiency, unspecified: Secondary | ICD-10-CM | POA: Diagnosis not present

## 2013-12-05 ENCOUNTER — Other Ambulatory Visit (HOSPITAL_COMMUNITY): Payer: Federal, State, Local not specified - PPO

## 2013-12-13 ENCOUNTER — Encounter (HOSPITAL_COMMUNITY): Payer: Medicare Other | Attending: Hematology and Oncology

## 2013-12-13 ENCOUNTER — Ambulatory Visit (HOSPITAL_COMMUNITY): Payer: Federal, State, Local not specified - PPO

## 2013-12-13 DIAGNOSIS — Z853 Personal history of malignant neoplasm of breast: Secondary | ICD-10-CM | POA: Diagnosis not present

## 2013-12-13 DIAGNOSIS — C50919 Malignant neoplasm of unspecified site of unspecified female breast: Secondary | ICD-10-CM

## 2013-12-13 DIAGNOSIS — D649 Anemia, unspecified: Secondary | ICD-10-CM

## 2013-12-13 DIAGNOSIS — E039 Hypothyroidism, unspecified: Secondary | ICD-10-CM

## 2013-12-13 LAB — COMPREHENSIVE METABOLIC PANEL
Albumin: 3.7 g/dL (ref 3.5–5.2)
Alkaline Phosphatase: 74 U/L (ref 39–117)
BUN: 21 mg/dL (ref 6–23)
CO2: 21 mEq/L (ref 19–32)
Chloride: 107 mEq/L (ref 96–112)
Creatinine, Ser: 1.09 mg/dL (ref 0.50–1.10)
GFR calc non Af Amer: 45 mL/min — ABNORMAL LOW (ref >90)
Glucose, Bld: 80 mg/dL (ref 70–99)
Potassium: 3.2 mEq/L — ABNORMAL LOW (ref 3.7–5.3)
Total Bilirubin: 0.4 mg/dL (ref 0.3–1.2)

## 2013-12-13 LAB — CBC
Hemoglobin: 11.9 g/dL — ABNORMAL LOW (ref 12.0–15.0)
MCV: 88.8 fL (ref 78.0–100.0)
Platelets: 215 10*3/uL (ref 150–400)
RBC: 4.19 MIL/uL (ref 3.87–5.11)
RDW: 15 % (ref 11.5–15.5)
WBC: 6.4 10*3/uL (ref 4.0–10.5)

## 2013-12-13 LAB — TSH: TSH: 2.827 u[IU]/mL (ref 0.350–4.500)

## 2013-12-13 LAB — IRON AND TIBC
Iron: 101 ug/dL (ref 42–135)
Saturation Ratios: 28 % (ref 20–55)
TIBC: 363 ug/dL (ref 250–470)
UIBC: 262 ug/dL (ref 125–400)

## 2013-12-13 LAB — FERRITIN: Ferritin: 78 ng/mL (ref 10–291)

## 2013-12-13 NOTE — Progress Notes (Signed)
Labs drawn today for cbc,cmp,[id number],cea,tsh,ferr,Iron and IBC

## 2013-12-14 NOTE — Progress Notes (Signed)
This encounter was created in error - please disregard.

## 2013-12-22 ENCOUNTER — Encounter (HOSPITAL_COMMUNITY): Payer: Medicare Other | Attending: Hematology and Oncology

## 2013-12-22 ENCOUNTER — Encounter (HOSPITAL_COMMUNITY): Payer: Self-pay

## 2013-12-22 ENCOUNTER — Encounter (HOSPITAL_COMMUNITY): Payer: Medicare Other

## 2013-12-22 VITALS — BP 148/72 | HR 82 | Temp 97.4°F | Resp 18 | Wt 127.8 lb

## 2013-12-22 DIAGNOSIS — D500 Iron deficiency anemia secondary to blood loss (chronic): Secondary | ICD-10-CM | POA: Insufficient documentation

## 2013-12-22 DIAGNOSIS — Z853 Personal history of malignant neoplasm of breast: Secondary | ICD-10-CM | POA: Diagnosis not present

## 2013-12-22 DIAGNOSIS — C50919 Malignant neoplasm of unspecified site of unspecified female breast: Secondary | ICD-10-CM | POA: Insufficient documentation

## 2013-12-22 DIAGNOSIS — M810 Age-related osteoporosis without current pathological fracture: Secondary | ICD-10-CM

## 2013-12-22 DIAGNOSIS — E538 Deficiency of other specified B group vitamins: Secondary | ICD-10-CM | POA: Diagnosis not present

## 2013-12-22 MED ORDER — CLINDAMYCIN PHOSPHATE (1 DOSE) 2 % VA CREA: TOPICAL_CREAM | VAGINAL | Status: DC

## 2013-12-22 NOTE — Patient Instructions (Signed)
.  [facility] Discharge Instructions  RECOMMENDATIONS MADE BY THE CONSULTANT AND ANY TEST RESULTS WILL BE SENT TO YOUR REFERRING PHYSICIAN.  EXAM FINDINGS BY THE PHYSICIAN TODAY AND SIGNS OR SYMPTOMS TO REPORT TO CLINIC OR PRIMARY PHYSICIAN: Exam and findings as discussed by Dr. Barnet Glasgow.   INSTRUCTIONS/FOLLOW-UP: 6 months and labs   Thank you for choosing [facility] to provide your oncology and hematology care.  To afford each patient quality time with our providers, please arrive at least 15 minutes before your scheduled appointment time.  With your help, our goal is to use those 15 minutes to complete the necessary work-up to ensure our physicians have the information they need to help with your evaluation and healthcare recommendations.    Effective January 1st, 2014, we ask that you re-schedule your appointment with our physicians should you arrive 10 or more minutes late for your appointment.  We strive to give you quality time with our providers, and arriving late affects you and other patients whose appointments are after yours.    Again, thank you for choosing [facility].  Our hope is that these requests will decrease the amount of time that you wait before being seen by our physicians.       _____________________________________________________________  Should you have questions after your visit to [facility], please contact our office at [phone] between the hours of 8:30 a.m. and 5:00 p.m.  Voicemails left after 4:30 p.m. will not be returned until the following business day.  For prescription refill requests, have your pharmacy contact our office with your prescription refill request.

## 2013-12-22 NOTE — Progress Notes (Signed)
[facility]  OFFICE PROGRESS NOTE  Leonides Grills, MD [address] [place] [address]  DIAGNOSIS: Breast cancer, unspecified laterality - Plan: CBC with Differential, Comprehensive metabolic panel, CEA, Cancer antigen 27.29, CBC with Differential, Comprehensive metabolic panel, CEA, Cancer antigen 27.29  Iron deficiency anemia secondary to blood loss (chronic) - Plan: CBC with Differential, Comprehensive metabolic panel, CEA, Cancer antigen 27.29, Ferritin  Chief Complaint  Patient presents with  . Breast Cancer    CURRENT THERAPY: Iron supplements 3 times per week plus vitamin B12 parenterally monthly.  INTERVAL HISTORY: Jane Andrews 80 y.o. female returns for followup of breast cancer and iron deficiency responding well to oral iron supplements and parenteral vitamin B12.j Negative self breast examination. No hot flashes but does have a vaginal discharge which she has had last 2 years. Appetite is good with no nausea, vomiting, dysuria, hematuria, epistaxis, melena, hematochezia, hematuria, lower extremity swelling or redness, PND, orthopnea, palpitations, skin rash, headache, or seizures. She denies any diarrhea, constipation, incontinence, or worsening joint pains.  MEDICAL HISTORY: Past Medical History  Diagnosis Date  . Hypertension   . Hypothyroid   . Abdominal adhesions 1994  . Atrial fibrillation 10/2012    Associated with severe anemia and esophageal pill impaction  . Gastroesophageal reflux disease     Hiatal hernia  . Breast carcinoma     right mastectomy  . Anxiety and depression   . Cholelithiasis     asymptomatic  . Nephrolithiasis 2004    painless hematuria  . Upper GI bleed 2004    Multiple episodes of melena-? due to gastritis or adverse drug effect (nonsteroidals, small bowel ulceration with Fosamax); caused by Pepto-Bismol during one Emergency Department evaluation  .  Malabsorption     Short gut syndrome following small bowel resection surgery x2    INTERIM HISTORY: has Chronic diarrhea; Hypertension; Anemia, normocytic normochromic; CKD (chronic kidney disease) stage 3, GFR 30-59 ml/min; Hypothyroid; Atrial fibrillation; Breast carcinoma; Malabsorption; and Ecchymoses, spontaneous on her problem list.   Progress Notes    #1 anemia presently consistent with iron deficiency  #2 atrial fibrillation in the past not in need of more intervention according to the patient today.  #3 mild renal insufficiency  #4 vitamin B12 deficiency diagnosed many years ago on monthly B12 shots, interestingly her B12 level in November 2030 was abnormal at 202 but repeat you the day is back to 424  #5 hypothyroidism  #6 osteoporosis on therapy  #7 right-sided breast cancer 25+ years ago with notice recurrence treated at that time with a modified radical mastectomy. She had no further therapy.       ALLERGIES:  is allergic to codeine.  MEDICATIONS: has a current medication list which includes the following prescription(s): alprazolam, calcium carbonate-vitamin d, cholecalciferol, cyanocobalamin, duloxetine, folic acid, glucosamine-chondroitin, iron polysaccharides, acidophilus, levothyroxine, multivitamin, fish oil, pantoprazole, and clindamycin phosphate (1 dose).  SURGICAL HISTORY:  Past Surgical History  Procedure Laterality Date  . Abdominal hysterectomy    . Bowel resection      Resulting short gut syndrome  . Colonoscopy w/ polypectomy  2005    Lipoma; diverticulosis  . Upper gastrointestinal endoscopy    . Mastectomy  right breast  . Colonoscopy with esophagogastroduodenoscopy (egd)  11/22/2012    [facility]  . Mastectomy      Carcinoma of the breast; right  . Abdominal hysterectomy  1960    massive gynecologic  bleeding  . Laparoscopic lysis of adhesions  1965    FAMILY HISTORY: family history includes Anuerysm in her father; COPD in her sister; Cancer in an  other family member; Healthy in her brother and sister; Rheum arthritis in her sister. There is no history of Colon cancer.  SOCIAL HISTORY:  reports that she has quit smoking. Her smoking use included Cigarettes. She has a 30 pack-year smoking history. She has never used smokeless tobacco. She reports that she does not drink alcohol or use illicit drugs.  REVIEW OF SYSTEMS:  Other than that discussed above is noncontributory.  PHYSICAL EXAMINATION: ECOG PERFORMANCE STATUS: 1 - Symptomatic but completely ambulatory  Blood pressure 148/72, pulse 82, temperature 97.4 F (36.3 C), temperature source Oral, resp. rate 18, weight 127 lb 12.8 oz (57.97 kg).  GENERAL:alert, no distress and comfortable SKIN: skin color, texture, turgor are normal, no rashes or significant lesions EYES: PERLA; Conjunctiva are pink and non-injected, sclera clear OROPHARYNX:no exudate, no erythema on lips, buccal mucosa, or tongue. NECK: supple, thyroid normal size, non-tender, without nodularity. No masses CHEST: Status post right breast reconstruction. Left breast without mass. LYMPH:  no palpable lymphadenopathy in the cervical, axillary or inguinal LUNGS: clear to auscultation and percussion with normal breathing effort HEART: Irregularly irregular with no S3.. ABDOMEN:abdomen soft, non-tender and normal bowel sounds MUSCULOSKELETAL:no cyanosis of digits and no clubbing. Range of motion normal.  NEURO: alert & oriented x 3 with fluent speech, no focal motor/sensory deficits   LABORATORY DATA: Infusion on 12/13/2013  Component Date Value Range Status  . CA 27.29 12/13/2013 22  0 - 39 U/mL Final   Performed at [facility]  . CEA 12/13/2013 11.0* 0.0 - 5.0 ng/mL Final   Performed at [facility]  . Sodium 12/13/2013 142  137 - 147 mEq/L Final   Please note change in reference range.  . Potassium 12/13/2013 3.2* 3.7 - 5.3 mEq/L Final   Please note change in reference range.  . Chloride  12/13/2013 107  96 - 112 mEq/L Final  . CO2 12/13/2013 21  19 - 32 mEq/L Final  . Glucose, Bld 12/13/2013 80  70 - 99 mg/dL Final  . BUN 12/13/2013 21  6 - 23 mg/dL Final  . Creatinine, Ser 12/13/2013 1.09  0.50 - 1.10 mg/dL Final  . Calcium 12/13/2013 9.5  8.4 - 10.5 mg/dL Final  . Total Protein 12/13/2013 7.0  6.0 - 8.3 g/dL Final  . Albumin 12/13/2013 3.7  3.5 - 5.2 g/dL Final  . AST 12/13/2013 18  0 - 37 U/L Final  . ALT 12/13/2013 13  0 - 35 U/L Final  . Alkaline Phosphatase 12/13/2013 74  39 - 117 U/L Final  . Total Bilirubin 12/13/2013 0.4  0.3 - 1.2 mg/dL Final  . GFR calc non Af Amer 12/13/2013 45* >90 mL/min Final  . GFR calc Af Amer 12/13/2013 52* >90 mL/min Final   Comment: (NOTE)                          The eGFR has been calculated using the CKD EPI equation.                          This calculation has not been validated in all clinical situations.  eGFR's persistently <90 mL/min signify possible Chronic Kidney                          Disease.  Marland Kitchen TSH 12/13/2013 2.827  0.350 - 4.500 uIU/mL Final   Performed at [facility]  . WBC 12/13/2013 6.4  4.0 - 10.5 K/uL Final  . RBC 12/13/2013 4.19  3.87 - 5.11 MIL/uL Final  . Hemoglobin 12/13/2013 11.9* 12.0 - 15.0 g/dL Final  . HCT 12/13/2013 37.2  36.0 - 46.0 % Final  . MCV 12/13/2013 88.8  78.0 - 100.0 fL Final  . MCH 12/13/2013 28.4  26.0 - 34.0 pg Final  . MCHC 12/13/2013 32.0  30.0 - 36.0 g/dL Final  . RDW 12/13/2013 15.0  11.5 - 15.5 % Final  . Platelets 12/13/2013 215  150 - 400 K/uL Final  . Ferritin 12/13/2013 78  10 - 291 ng/mL Final   Performed at [facility]  . Iron 12/13/2013 101  42 - 135 ug/dL Final  . TIBC 12/13/2013 363  250 - 470 ug/dL Final  . Saturation Ratios 12/13/2013 28  20 - 55 % Final  . UIBC 12/13/2013 262  125 - 400 ug/dL Final   Performed at [facility]: No new pathology.  Urinalysis    Component Value Date/Time    COLORURINE YELLOW 10/04/2013 2140   APPEARANCEUR CLEAR 10/04/2013 2140   LABSPEC 1.015 10/04/2013 2140   PHURINE 5.5 10/04/2013 2140   GLUCOSEU NEGATIVE 10/04/2013 2140   HGBUR MODERATE* 10/04/2013 2140   BILIRUBINUR NEGATIVE 10/04/2013 2140   KETONESUR NEGATIVE 10/04/2013 2140   PROTEINUR NEGATIVE 10/04/2013 2140   UROBILINOGEN 0.2 10/04/2013 2140   NITRITE NEGATIVE 10/04/2013 2140   LEUKOCYTESUR TRACE* 10/04/2013 2140    RADIOGRAPHIC STUDIES: No results found.  ASSESSMENT:  #1. Stage I right breast cancer, status post modified radical mastectomy with reconstruction, no evidence of disease with tumor resected 25 years ago. #2. Iron deficiency, responding well to oral iron 3 times a week. #3. Atrial fibrillation, controlled ventricular response. #4. By the B12 deficiency, on supplements monthly parenterally. #5. Hypothyroidism, on treatment. #6. Osteoporosis, on treatment. #7. Vaginal discharge.   PLAN:  #1. Patient was advised to ask her family physician to refer her to a gynecologist would perform a pelvic exam himself. In the interim, she was started on Clindesse vaginal cream to use 3 times weekly. #2. Followup in 6 months with CBC, ferritin, chem profile, tumor markers.   All questions were answered. The patient knows to call the clinic with any problems, questions or concerns. We can certainly see the patient much sooner if necessary.   I spent 25 minutes counseling the patient face to face. The total time spent in the appointment was 30 minutes.    Doroteo Bradford, MD 12/22/2013 10:52 AM

## 2013-12-29 ENCOUNTER — Encounter (HOSPITAL_COMMUNITY): Payer: Self-pay | Admitting: Emergency Medicine

## 2013-12-29 ENCOUNTER — Emergency Department (HOSPITAL_COMMUNITY)
Admission: EM | Admit: 2013-12-29 | Discharge: 2013-12-29 | Disposition: A | Discharge status: Home / Self Care | Payer: Medicare Other | Attending: Emergency Medicine | Admitting: Emergency Medicine

## 2013-12-29 DIAGNOSIS — Z90710 Acquired absence of both cervix and uterus: Secondary | ICD-10-CM | POA: Insufficient documentation

## 2013-12-29 DIAGNOSIS — R000 Tachycardia, unspecified: Secondary | ICD-10-CM | POA: Insufficient documentation

## 2013-12-29 DIAGNOSIS — R197 Diarrhea, unspecified: Secondary | ICD-10-CM | POA: Insufficient documentation

## 2013-12-29 DIAGNOSIS — K219 Gastro-esophageal reflux disease without esophagitis: Secondary | ICD-10-CM | POA: Insufficient documentation

## 2013-12-29 DIAGNOSIS — D649 Anemia, unspecified: Secondary | ICD-10-CM | POA: Insufficient documentation

## 2013-12-29 DIAGNOSIS — F3289 Other specified depressive episodes: Secondary | ICD-10-CM | POA: Insufficient documentation

## 2013-12-29 DIAGNOSIS — Z87442 Personal history of urinary calculi: Secondary | ICD-10-CM | POA: Diagnosis not present

## 2013-12-29 DIAGNOSIS — F329 Major depressive disorder, single episode, unspecified: Secondary | ICD-10-CM | POA: Diagnosis not present

## 2013-12-29 DIAGNOSIS — I10 Essential (primary) hypertension: Secondary | ICD-10-CM | POA: Diagnosis not present

## 2013-12-29 DIAGNOSIS — F411 Generalized anxiety disorder: Secondary | ICD-10-CM | POA: Diagnosis not present

## 2013-12-29 DIAGNOSIS — E039 Hypothyroidism, unspecified: Secondary | ICD-10-CM | POA: Diagnosis not present

## 2013-12-29 DIAGNOSIS — Z792 Long term (current) use of antibiotics: Secondary | ICD-10-CM | POA: Diagnosis not present

## 2013-12-29 DIAGNOSIS — Z853 Personal history of malignant neoplasm of breast: Secondary | ICD-10-CM | POA: Insufficient documentation

## 2013-12-29 DIAGNOSIS — R1084 Generalized abdominal pain: Secondary | ICD-10-CM | POA: Diagnosis not present

## 2013-12-29 DIAGNOSIS — I4891 Unspecified atrial fibrillation: Secondary | ICD-10-CM | POA: Diagnosis not present

## 2013-12-29 DIAGNOSIS — Z9889 Other specified postprocedural states: Secondary | ICD-10-CM | POA: Diagnosis not present

## 2013-12-29 DIAGNOSIS — Z79899 Other long term (current) drug therapy: Secondary | ICD-10-CM | POA: Insufficient documentation

## 2013-12-29 DIAGNOSIS — R112 Nausea with vomiting, unspecified: Secondary | ICD-10-CM | POA: Insufficient documentation

## 2013-12-29 LAB — COMPREHENSIVE METABOLIC PANEL
ALT: 12 U/L (ref 0–35)
AST: 15 U/L (ref 0–37)
Albumin: 4 g/dL (ref 3.5–5.2)
Alkaline Phosphatase: 73 U/L (ref 39–117)
BUN: 24 mg/dL — AB (ref 6–23)
CALCIUM: 9.7 mg/dL (ref 8.4–10.5)
CO2: 20 meq/L (ref 19–32)
Chloride: 104 mEq/L (ref 96–112)
Creatinine, Ser: 1.02 mg/dL (ref 0.50–1.10)
GFR calc Af Amer: 56 mL/min — ABNORMAL LOW (ref >90)
GFR, EST NON AFRICAN AMERICAN: 48 mL/min — AB (ref >90)
GLUCOSE: 142 mg/dL — AB (ref 70–99)
Potassium: 3.9 mEq/L (ref 3.7–5.3)
Sodium: 141 mEq/L (ref 137–147)
TOTAL PROTEIN: 7.5 g/dL (ref 6.0–8.3)
Total Bilirubin: 0.5 mg/dL (ref 0.3–1.2)

## 2013-12-29 LAB — CBC
HEMATOCRIT: 40.3 % (ref 36.0–46.0)
HEMOGLOBIN: 13.7 g/dL (ref 12.0–15.0)
MCH: 30.3 pg (ref 26.0–34.0)
MCHC: 34 g/dL (ref 30.0–36.0)
MCV: 89.2 fL (ref 78.0–100.0)
Platelets: 175 10*3/uL (ref 150–400)
RBC: 4.52 MIL/uL (ref 3.87–5.11)
RDW: 14.3 % (ref 11.5–15.5)
WBC: 12.2 10*3/uL — ABNORMAL HIGH (ref 4.0–10.5)

## 2013-12-29 LAB — LIPASE, BLOOD: LIPASE: 48 U/L (ref 11–59)

## 2013-12-29 MED ORDER — ONDANSETRON HCL 4 MG/2ML IJ SOLN
4.0000 mg | Freq: Once | INTRAMUSCULAR | Status: AC
  Administered 2013-12-29: 4 mg via INTRAVENOUS
  Filled 2013-12-29: qty 2

## 2013-12-29 MED ORDER — SODIUM CHLORIDE 0.9 % IV SOLN
INTRAVENOUS | Status: DC
  Administered 2013-12-29: 07:00:00 via INTRAVENOUS

## 2013-12-29 MED ORDER — FENTANYL CITRATE 0.05 MG/ML IJ SOLN
50.0000 ug | Freq: Once | INTRAMUSCULAR | Status: AC
  Administered 2013-12-29: 50 ug via INTRAVENOUS
  Filled 2013-12-29: qty 2

## 2013-12-29 MED ORDER — ONDANSETRON HCL 4 MG PO TABS: 4.0000 mg | ORAL_TABLET | Freq: Four times a day (QID) | ORAL | Status: DC

## 2013-12-29 NOTE — ED Provider Notes (Signed)
CSN: [id number]     Arrival date & time 12/29/13  0608 History   First MD Initiated Contact with Patient 12/29/13 [phone]     Chief Complaint  Patient presents with  . Nausea  . Emesis  . Diarrhea   (Consider location/radiation/quality/duration/timing/severity/associated sxs/prior Treatment) HPI History provided by patient. Has otherwise been feeling well until this morning, woke up with nausea vomiting followed by diarrhea. She's had some associated abdominal cramping. She denies any blood in emesis or stools. No fevers or chills. No recent travel. No known sick contacts. No recent antibiotics.  No known bad food exposures. Symptoms moderate in severity. Brought in by EMS.   Past Medical History  Diagnosis Date  . Hypertension   . Hypothyroid   . Abdominal adhesions 1994  . Atrial fibrillation 10/2012    Associated with severe anemia and esophageal pill impaction  . Gastroesophageal reflux disease     Hiatal hernia  . Breast carcinoma     right mastectomy  . Anxiety and depression   . Cholelithiasis     asymptomatic  . Nephrolithiasis 2004    painless hematuria  . Upper GI bleed 2004    Multiple episodes of melena-? due to gastritis or adverse drug effect (nonsteroidals, small bowel ulceration with Fosamax); caused by Pepto-Bismol during one Emergency Department evaluation  . Malabsorption     Short gut syndrome following small bowel resection surgery x2   Past Surgical History  Procedure Laterality Date  . Abdominal hysterectomy    . Bowel resection      Resulting short gut syndrome  . Colonoscopy w/ polypectomy  2005    Lipoma; diverticulosis  . Upper gastrointestinal endoscopy    . Mastectomy  right breast  . Colonoscopy with esophagogastroduodenoscopy (egd)  11/22/2012    [facility]  . Mastectomy      Carcinoma of the breast; right  . Abdominal hysterectomy  1960    massive gynecologic bleeding  . Laparoscopic lysis of adhesions  1965   Family History  Problem  Relation Age of Onset  . Anuerysm Father   . Rheum arthritis Sister   . Healthy Sister   . COPD Sister   . Healthy Brother   . Cancer    . Colon cancer Neg Hx    History  Substance Use Topics  . Smoking status: Former Smoker -- 1.50 packs/day for 20 years    Types: Cigarettes  . Smokeless tobacco: Never Used  . Alcohol Use: No   OB History   Grav Para Term Preterm Abortions TAB SAB Ect Mult Living                 Review of Systems  Constitutional: Negative for fever and chills.  Respiratory: Negative for shortness of breath.   Cardiovascular: Negative for chest pain.  Gastrointestinal: Positive for vomiting and diarrhea. Negative for blood in stool.  Genitourinary: Negative for dysuria.  Musculoskeletal: Negative for back pain.  Skin: Negative for rash.  Neurological: Negative for headaches.  All other systems reviewed and are negative.    Allergies  Codeine  Home Medications   Current Outpatient Rx  Name  Route  Sig  Dispense  Refill  . ALPRAZolam (XANAX) 1 MG tablet   Oral   Take 1 mg by mouth at bedtime as needed for sleep.         . Calcium Carbonate-Vitamin D (CALCIUM + D PO)   Oral   Take 1 tablet by mouth daily.         Marland Kitchen  Cholecalciferol (VITAMIN D PO)   Oral   Take 1,200 mg by mouth daily. Takes calcium and vitamin d         . Cyanocobalamin (VITAMIN B-12 IJ)   Injection   Inject as directed every 30 (thirty) days.         . DULoxetine (CYMBALTA) 60 MG capsule   Oral   Take 60 mg by mouth every morning.          . folic acid (FOLVITE) 1 MG tablet   Oral   Take 1 mg by mouth daily.         Marland Kitchen glucosamine-chondroitin 500-400 MG tablet   Oral   Take 1 tablet by mouth every morning.          . iron polysaccharides (NIFEREX) 150 MG capsule   Oral   Take 1 capsule (150 mg total) by mouth every Monday, Wednesday, and Friday.   30 capsule   5   . Lactobacillus (ACIDOPHILUS) 100 MG CAPS   Oral   Take 100 mg by mouth every  morning.          Marland Kitchen levothyroxine (SYNTHROID, LEVOTHROID) 50 MCG tablet   Oral   Take 50 mcg by mouth every morning.         . Multiple Vitamin (MULTIVITAMIN) tablet   Oral   Take 1 tablet by mouth every morning.          . Omega-3 Fatty Acids (FISH OIL) 1000 MG CAPS   Oral   Take 1,000 mg by mouth every morning.          . pantoprazole (PROTONIX) 40 MG tablet   Oral   Take 40 mg by mouth daily.         . Clindamycin Phosphate, 1 Dose, vaginal cream      Insert deep into vagina three times weekly.   5.8 g   0    BP 140/74  Pulse 103  Temp(Src) 98.3 F (36.8 C) (Oral)  Ht 5\' 4"  (1.626 m)  Wt 122 lb (55.339 kg)  BMI 20.93 kg/m2 Physical Exam  Constitutional: She is oriented to person, place, and time. She appears well-developed and well-nourished.  HENT:  Head: Normocephalic and atraumatic.  Dry mucous membranes  Eyes: EOM are normal. Pupils are equal, round, and reactive to light.  Neck: Neck supple.  Cardiovascular: Regular rhythm and intact distal pulses.   Tachycardic to 103  Pulmonary/Chest: Effort normal. No respiratory distress.  Abdominal: Soft. Bowel sounds are normal. She exhibits no distension and no mass. There is no rebound and no guarding.  Mild diffuse tenderness - no acute abdomen  Musculoskeletal: Normal range of motion. She exhibits no edema.  Neurological: She is alert and oriented to person, place, and time.  Skin: Skin is warm and dry.    ED Course  Procedures (including critical care time) Labs Review Labs Reviewed  CBC - Abnormal; Notable for the following:    WBC 12.2 (*)    All other components within normal limits  COMPREHENSIVE METABOLIC PANEL - Abnormal; Notable for the following:    Glucose, Bld 142 (*)    BUN 24 (*)    GFR calc non Af Amer 48 (*)    GFR calc Af Amer 56 (*)    All other components within normal limits  LIPASE, BLOOD  URINALYSIS, ROUTINE W REFLEX MICROSCOPIC   IV fluids. Zofran. IV fentanyl.  No  febrile illness or flulike symptoms otherwise  7:42 AM is  feeling much better, drinking ice water. Repeat abdominal exam is benign - soft, nontender, nondistended. Patient feels comfortable the plan discharge home. Prescription for Zofran provided. She will followup with her primary care physician.   MDM  Diagnosis: Nausea vomiting diarrhea  Improved with IV fluids and medications Labs reviewed as above Vital signs and nurses notes reviewed and considered  Teressa Lower, MD 12/29/13 [phone]

## 2013-12-29 NOTE — Discharge Instructions (Signed)
Gastroenteritis  Viral gastroenteritis is also known as stomach flu. This condition affects the stomach and intestinal tract. It can cause sudden diarrhea and vomiting. The illness typically lasts 3 to 8 days. Most people develop an immune response that eventually gets rid of the virus. While this natural response develops, the virus can make you quite ill.  CAUSES  Many different viruses can cause gastroenteritis, such as rotavirus or noroviruses. You can catch one of these viruses by consuming contaminated food or water. You may also catch a virus by sharing utensils or other personal items with an infected person or by touching a contaminated surface.  SYMPTOMS  The most common symptoms are diarrhea and vomiting. These problems can cause a severe loss of body fluids (dehydration) and a body salt (electrolyte) imbalance. Other symptoms may include:  Fever.  Headache.  Fatigue.  Abdominal pain. DIAGNOSIS  Your caregiver can usually diagnose viral gastroenteritis based on your symptoms and a physical exam. A stool sample may also be taken to test for the presence of viruses or other infections.  TREATMENT  This illness typically goes away on its own. Treatments are aimed at rehydration. The most serious cases of viral gastroenteritis involve vomiting so severely that you are not able to keep fluids down. In these cases, fluids must be given through an intravenous line (IV).  HOME CARE INSTRUCTIONS  Drink enough fluids to keep your urine clear or pale yellow. Drink small amounts of fluids frequently and increase the amounts as tolerated.  Ask your caregiver for specific rehydration instructions.  Avoid:  Foods high in sugar.  Alcohol.  Carbonated drinks.  Tobacco.  Juice.  Caffeine drinks.  Extremely hot or cold fluids.  Fatty, greasy foods.  Too much intake of anything at one time.  Dairy products until 24 to 48 hours after diarrhea stops.  You may consume probiotics. Probiotics are  active cultures of beneficial bacteria. They may lessen the amount and number of diarrheal stools in adults. Probiotics can be found in yogurt with active cultures and in supplements.  Wash your hands well to avoid spreading the virus.  Only take over-the-counter or prescription medicines for pain, discomfort, or fever as directed by your caregiver. Do not give aspirin to children. Antidiarrheal medicines are not recommended.  Ask your caregiver if you should continue to take your regular prescribed and over-the-counter medicines.  Keep all follow-up appointments as directed by your caregiver. SEEK IMMEDIATE MEDICAL CARE IF:  You are unable to keep fluids down.  You do not urinate at least once every 6 to 8 hours.  You develop shortness of breath.  You notice blood in your stool or vomit. This may look like coffee grounds.  You have abdominal pain that increases or is concentrated in one small area (localized).  You have persistent vomiting or diarrhea.  You have a fever.  The patient is a child younger than 3 months, and he or she has a fever.  The patient is a child older than 3 months, and he or she has a fever and persistent symptoms.  The patient is a child older than 3 months, and he or she has a fever and symptoms suddenly get worse.  The patient is a baby, and he or she has no tears when crying. MAKE SURE YOU:  Understand these instructions.  Will watch your condition.  Will get help right away if you are not doing well or get worse. Document Released: 12/01/2005 Document Revised: 02/23/2012 Document Reviewed:  09/17/2011  Exit[employer] Patient Information 2014 [place].

## 2014-01-02 DIAGNOSIS — IMO0002 Reserved for concepts with insufficient information to code with codable children: Secondary | ICD-10-CM | POA: Diagnosis not present

## 2014-01-02 DIAGNOSIS — H6690 Otitis media, unspecified, unspecified ear: Secondary | ICD-10-CM | POA: Diagnosis not present

## 2014-01-02 DIAGNOSIS — K2970 Gastritis, unspecified, without bleeding: Secondary | ICD-10-CM | POA: Diagnosis not present

## 2014-01-02 DIAGNOSIS — I10 Essential (primary) hypertension: Secondary | ICD-10-CM | POA: Diagnosis not present

## 2014-01-04 ENCOUNTER — Ambulatory Visit (INDEPENDENT_AMBULATORY_CARE_PROVIDER_SITE_OTHER): Payer: Medicare Other | Admitting: Obstetrics and Gynecology

## 2014-01-04 ENCOUNTER — Encounter: Payer: Self-pay | Admitting: Obstetrics and Gynecology

## 2014-01-04 VITALS — BP 130/80 | Ht 64.0 in | Wt 124.5 lb

## 2014-01-04 DIAGNOSIS — N952 Postmenopausal atrophic vaginitis: Secondary | ICD-10-CM

## 2014-01-04 NOTE — Patient Instructions (Signed)
Atrophic Vaginitis Atrophic vaginitis is a problem of low levels of estrogen in women. This problem can happen at any age. It is most common in women who have gone through menopause ("the change").  HOW WILL I KNOW IF I HAVE THIS PROBLEM? You may have:  Trouble with peeing (urinating), such as:  Going to the bathroom often.  A hard time holding your pee until you reach a bathroom.  Leaking pee.  Having pain when you pee.  Itching or a burning feeling.  Vaginal bleeding and spotting.  Pain during sex.  Dryness of the vagina.  A yellow, bad-smelling fluid (discharge) coming from the vagina. HOW WILL MY DOCTOR CHECK FOR THIS PROBLEM?  During your exam, your doctor will likely find the problem.  If there is a vaginal fluid, it may be checked for infection. HOW WILL THIS PROBLEM BE TREATED? Keep the vulvar skin as clean as possible. Moisturizers and lubricants can help with some of the symptoms. Estrogen replacement can help. There are 2 ways to take estrogen:  Systemic estrogen gets estrogen to your whole body. It takes many weeks or months before the symptoms get better.  You take an estrogen pill.  You use a skin patch. This is a patch that you put on your skin.  If you still have your uterus, your doctor may ask you to take a hormone. Talk to your doctor about the right medicine for you.  Estrogen cream.  This puts estrogen only at the part of your body where you apply it. The cream is put into the vagina or put on the vulvar skin. For some women, estrogen cream works faster than pills or the patch. CAN ALL WOMEN WITH THIS PROBLEM USE ESTROGEN? No. Women with certain types of cancer, liver problems, or problems with blood clots should not take estrogen. Your doctor can help you decide the best treatment for your symptoms. Document Released: 05/19/2008 Document Revised: 02/23/2012 Document Reviewed: 05/19/2008 [employer] Patient Information 2014 [employer].  

## 2014-01-04 NOTE — Progress Notes (Signed)
This chart was scribed by Jenne Campus, Medical Scribe, for Dr. Mallory Shirk on 01/04/14 at 10:32 AM. This chart was reviewed by Dr. Mallory Shirk and is accurate.   Assessment:  Problem visit: vaginal discharge   Plan:  1. Discussed vaginal estrogen cream once or twice a week.  Pt declined. Pt's questions answered to apparent satisfaction  2. return annually or prn 3. . Pt will let us know if she wants tx with topical estrogen qwk.  Subjective:  Jane Andrews is a 80 y.o. female No obstetric history on file. who presents for annual exam. No LMP recorded. Patient has had a hysterectomy. The patient has complaints today of yellow vaginal discharge for the past 2 years. She states that she has been seen for the same before but the "swabs were thrown away". She denies any sexual activity. No other complaints.   The following portions of the patient's history were reviewed and updated as appropriate: allergies, current medications, past family history, past medical history, past social history, past surgical history and problem list.  Review of Systems Constitutional: negative Gastrointestinal: negative Genitourinary: vaginal discharge S/p emergency hyst after c/s with 22 units blood required, s/p SBO with extensive SB resection  Objective:  BP 130/80  Ht 5\' 4"  (1.626 m)  Wt 124 lb 8 oz (56.473 kg)  BMI 21.36 kg/m2   BMI: Body mass index is 21.36 kg/(m^2).  *Pelvic exam performed with pt's permission. Chaperone was present. Exam was completed with no discomfort or complications.  General Appearance: Alert, appropriate appearance for age. No acute distress HEENT: Grossly normal Neck / Thyroid:  Cardiovascular: RRR; normal S1, S2, no murmur Lungs: CTA bilaterally Back: No CVAT Breast Exam: Not indicated. Gastrointestinal: Soft, non-tender, no masses or organomegaly Pelvic Exam: External genitalia: normal general appearance Vaginal: discharge, yellow Cervix: normal  appearance Rectovaginal: not indicated Lymphatic Exam: Non-palpable nodes in neck, clavicular, axillary, or inguinal regions  Skin: no rash or abnormalities Neurologic: Normal gait and speech, no tremor  Psychiatric: Alert and oriented, appropriate affect.  Urinalysis:Not done Wet Prep: negative  Mallory Shirk. MD Pgr [phone] 10:32 AM

## 2014-02-13 ENCOUNTER — Telehealth (INDEPENDENT_AMBULATORY_CARE_PROVIDER_SITE_OTHER): Payer: Self-pay | Admitting: *Deleted

## 2014-02-13 DIAGNOSIS — K625 Hemorrhage of anus and rectum: Secondary | ICD-10-CM

## 2014-02-13 NOTE — Telephone Encounter (Signed)
   Diagnosis:    Result(s)   Card 1: Positive:       Card 2: Positive:    Card 3: Positive:      Completed by: Thetis Schwimmer,LPN - preformed on  [id number]   HEMOCCULT SENSA DEVELOPER: LOT#:  [id number] EXPIRATION DATE: 2016-05   HEMOCCULT SENSA CARD:  LOT#: [id number]  EXPIRATION DATE: 09/15   CARD CONTROL RESULTS:  POSITIVE: Positive NEGATIVE: Negative    ADDITIONAL COMMENTS: This was preformed 07/12/13.

## 2014-02-14 NOTE — Telephone Encounter (Signed)
Lab is noted for 04/11/14.

## 2014-02-14 NOTE — Telephone Encounter (Signed)
Hemoccults positive. Results reviewed with patient. She denies melena or rectal bleeding. She also denies abdominal pain. Recent H&H was normal. CBC in 8 weeks. Patient will call if she experiences melena rectal bleeding or abdominal pain

## 2014-02-14 NOTE — Telephone Encounter (Signed)
Lab is noted for 04/11/14

## 2014-03-08 ENCOUNTER — Other Ambulatory Visit (INDEPENDENT_AMBULATORY_CARE_PROVIDER_SITE_OTHER): Payer: Self-pay | Admitting: *Deleted

## 2014-03-08 ENCOUNTER — Encounter (INDEPENDENT_AMBULATORY_CARE_PROVIDER_SITE_OTHER): Payer: Self-pay | Admitting: *Deleted

## 2014-03-08 DIAGNOSIS — K625 Hemorrhage of anus and rectum: Secondary | ICD-10-CM

## 2014-03-27 DIAGNOSIS — IMO0002 Reserved for concepts with insufficient information to code with codable children: Secondary | ICD-10-CM | POA: Diagnosis not present

## 2014-03-27 DIAGNOSIS — J019 Acute sinusitis, unspecified: Secondary | ICD-10-CM | POA: Diagnosis not present

## 2014-03-27 DIAGNOSIS — J209 Acute bronchitis, unspecified: Secondary | ICD-10-CM | POA: Diagnosis not present

## 2014-03-31 ENCOUNTER — Encounter (HOSPITAL_COMMUNITY): Payer: Self-pay | Admitting: Emergency Medicine

## 2014-03-31 ENCOUNTER — Emergency Department (HOSPITAL_COMMUNITY)
Admission: EM | Admit: 2014-03-31 | Discharge: 2014-03-31 | Disposition: A | Discharge status: Home / Self Care | Payer: Medicare Other | Attending: Emergency Medicine | Admitting: Emergency Medicine

## 2014-03-31 ENCOUNTER — Emergency Department (HOSPITAL_COMMUNITY): Payer: Medicare Other

## 2014-03-31 DIAGNOSIS — J069 Acute upper respiratory infection, unspecified: Secondary | ICD-10-CM | POA: Diagnosis not present

## 2014-03-31 DIAGNOSIS — IMO0002 Reserved for concepts with insufficient information to code with codable children: Secondary | ICD-10-CM | POA: Insufficient documentation

## 2014-03-31 DIAGNOSIS — F3289 Other specified depressive episodes: Secondary | ICD-10-CM | POA: Diagnosis not present

## 2014-03-31 DIAGNOSIS — Z862 Personal history of diseases of the blood and blood-forming organs and certain disorders involving the immune mechanism: Secondary | ICD-10-CM | POA: Insufficient documentation

## 2014-03-31 DIAGNOSIS — F329 Major depressive disorder, single episode, unspecified: Secondary | ICD-10-CM | POA: Insufficient documentation

## 2014-03-31 DIAGNOSIS — M129 Arthropathy, unspecified: Secondary | ICD-10-CM | POA: Diagnosis not present

## 2014-03-31 DIAGNOSIS — E039 Hypothyroidism, unspecified: Secondary | ICD-10-CM | POA: Diagnosis not present

## 2014-03-31 DIAGNOSIS — Z79899 Other long term (current) drug therapy: Secondary | ICD-10-CM | POA: Diagnosis not present

## 2014-03-31 DIAGNOSIS — F411 Generalized anxiety disorder: Secondary | ICD-10-CM | POA: Diagnosis not present

## 2014-03-31 DIAGNOSIS — I10 Essential (primary) hypertension: Secondary | ICD-10-CM | POA: Insufficient documentation

## 2014-03-31 DIAGNOSIS — Z8679 Personal history of other diseases of the circulatory system: Secondary | ICD-10-CM | POA: Insufficient documentation

## 2014-03-31 DIAGNOSIS — Z87891 Personal history of nicotine dependence: Secondary | ICD-10-CM | POA: Insufficient documentation

## 2014-03-31 DIAGNOSIS — Z853 Personal history of malignant neoplasm of breast: Secondary | ICD-10-CM | POA: Insufficient documentation

## 2014-03-31 DIAGNOSIS — J449 Chronic obstructive pulmonary disease, unspecified: Secondary | ICD-10-CM | POA: Diagnosis not present

## 2014-03-31 DIAGNOSIS — Z87442 Personal history of urinary calculi: Secondary | ICD-10-CM | POA: Diagnosis not present

## 2014-03-31 HISTORY — DX: Anemia, unspecified: D64.9

## 2014-03-31 HISTORY — DX: Low back pain, unspecified: M54.50

## 2014-03-31 HISTORY — DX: Allergic rhinitis, unspecified: J30.9

## 2014-03-31 HISTORY — DX: Low back pain: M54.5

## 2014-03-31 HISTORY — DX: Unspecified osteoarthritis, unspecified site: M19.90

## 2014-03-31 MED ORDER — ACETAMINOPHEN 325 MG PO TABS
650.0000 mg | ORAL_TABLET | Freq: Once | ORAL | Status: AC
  Administered 2014-03-31: 650 mg via ORAL
  Filled 2014-03-31: qty 2

## 2014-03-31 MED ORDER — IPRATROPIUM-ALBUTEROL 0.5-2.5 (3) MG/3ML IN SOLN
3.0000 mL | Freq: Once | RESPIRATORY_TRACT | Status: AC
  Administered 2014-03-31: 3 mL via RESPIRATORY_TRACT
  Filled 2014-03-31: qty 3

## 2014-03-31 MED ORDER — BENZONATATE 100 MG PO CAPS: 100.0000 mg | ORAL_CAPSULE | Freq: Three times a day (TID) | ORAL | Status: DC | PRN

## 2014-03-31 MED ORDER — ALBUTEROL SULFATE (2.5 MG/3ML) 0.083% IN NEBU
2.5000 mg | INHALATION_SOLUTION | Freq: Once | RESPIRATORY_TRACT | Status: AC
  Administered 2014-03-31: 2.5 mg via RESPIRATORY_TRACT
  Filled 2014-03-31: qty 3

## 2014-03-31 MED ORDER — ALBUTEROL SULFATE HFA 108 (90 BASE) MCG/ACT IN AERS
2.0000 | INHALATION_SPRAY | RESPIRATORY_TRACT | Status: AC
  Administered 2014-03-31: 2 via RESPIRATORY_TRACT
  Filled 2014-03-31: qty 6.7

## 2014-03-31 NOTE — ED Notes (Signed)
Patient with no complaints at this time. Respirations even and unlabored. Skin warm/dry. Discharge instructions reviewed with patient at this time. Patient given opportunity to voice concerns/ask questions. Patient discharged at this time and left Emergency Department with steady gait.   

## 2014-03-31 NOTE — Discharge Instructions (Signed)
°Emergency Department Resource Guide °1) Find a Doctor and Pay Out of Pocket °Although you won't have to find out who is covered by your insurance plan, it is a good idea to ask around and get recommendations. You will then need to call the office and see if the doctor you have chosen will accept you as a new patient and what types of options they offer for patients who are self-pay. Some doctors offer discounts or will set up payment plans for their patients who do not have insurance, but you will need to ask so you aren't surprised when you get to your appointment. ° °2) Contact Your Local Health Department °Not all health departments have doctors that can see patients for sick visits, but many do, so it is worth a call to see if yours does. If you don't know where your local health department is, you can check in your phone book. The CDC also has a tool to help you locate your state's health department, and many state websites also have listings of all of their local health departments. ° °3) Find a Walk-in Clinic °If your illness is not likely to be very severe or complicated, you may want to try a walk in clinic. These are popping up all over the country in pharmacies, drugstores, and shopping centers. They're usually staffed by nurse practitioners or physician assistants that have been trained to treat common illnesses and complaints. They're usually fairly quick and inexpensive. However, if you have serious medical issues or chronic medical problems, these are probably not your best option. ° °No Primary Care Doctor: °- Call [facility] at  832-8000 - they can help you locate a primary care doctor that  accepts your insurance, provides certain services, etc. °- Physician Referral Service- 1-800-533-3463 ° °Chronic Pain Problems: °Organization         Address  Phone   Notes  °[facility]  (336) 297-2271 Patients need to be referred by their primary care doctor.  ° °Medication  Assistance: °Organization         Address  Phone   Notes  °[place] [facility] [address] °[place], [place] [address] (336) 641-8030 --Must be a resident of [place] °-- Must have NO insurance coverage whatsoever (no Medicaid/ Medicare, etc.) °-- The pt. MUST have a primary care doctor that directs their care regularly and follows them in the community °  °[facility]  (866) 331-1348   °[employer]  (888) 892-1162   ° °Agencies that provide inexpensive medical care: °Organization         Address  Phone   Notes  °[facility]  (336) 832-8035   °[facility]    (336) 832-7272   °[facility] [address] °[place], [place] [address] (336) 832-4777   °[facility] [place] [address], °[place] (336) 271-4999   °[facility]    (336) 373-0678   °[facility]    (336) 272-1050   °[facility] ° [address], [place] Phone:  (336) 832-4444, Fax:  (336) 832-4440 Hours of Operation:  9 am - 6 pm, M-F.  Also accepts Medicaid/Medicare and self-pay.  ° [facility] ° [address], [place] Phone: (336) 832-3150, Fax: (336) 832-3151. Hours of Operation:  8:30 am - 5:30 pm, M-F.  Also accepts Medicaid and self-pay.  °[facility] [place] [address], [place] Phone: (336) 878-6027   °[facility] [address], [place], [place] (336)723-1848, Ext. 123 Mondays & Thursdays: 7-9 AM.  First 15 patients are seen on a first come, first serve basis. °  ° °Medicaid-accepting [place] Providers: ° °Organization         Address  Phone   Notes  °[facility] [address], [place] (336) 641-2100 Also accepts self-pay patients.  °[facility] [address], [place] ° (336) 856-9996   °[facility] [address], [place]  (336) 288-8857   °[facility] [address] [place] [address], [place] (336) 299-7000   °Veita Bland [address], [place]  ° (336) 373-1557 Only accepts [place] Access Medicaid patients after they have their name applied to their card.  ° °Self-Pay (no insurance) in [place]: ° °Organization         Address  Phone   Notes  °Sickle Cell Patients, [facility] [address], [place] (336) 832-1970   °[facility] [address], [place] (336) 832-4400   °[facility] ° [address], [place] (336) 992-4800   °[facility]/Dr. Osei-Bonsu ° [address] [place] [address], [place] or [address], [place] (336) [phone] Phone number for both [place] and [place] locations is the same.  °[facility] [address], [place] (336) 299-0000   °[facility] [address] [place] [address], [place] or [address] (336) 852-7530 °(336) 878-2260   °[facility] [address], [place] (336) 350-1642, phone; (336) 294-5005, fax Sees patients 1st and 3rd Saturday of every month.  Must not qualify for public or private insurance (i.e. Medicaid, Medicare, [place] Health Choice, Veterans' Benefits) • Household income should be no more than 200% of the poverty level •The clinic cannot treat you if you are pregnant or think you are pregnant • Sexually transmitted diseases are not treated at the clinic.  ° ° °Dental Care: °Organization         Address  Phone  Notes  °[place] [facility] [address], [place] (336) 641-6152 Accepts children up to age 21 who are enrolled in Medicaid or [place] Health Choice; pregnant women with a Medicaid card; and children who have applied for Medicaid or [place] Health Choice, but were declined, whose parents can pay a reduced fee at time of service.  °[place]  [facility] [place]  [address], [place] (336) 641-7733 Accepts children up to age 21 who are enrolled in Medicaid or [place] Health Choice; pregnant women with a Medicaid card; and children who have applied for Medicaid or [place] Health Choice, but were declined, whose parents can pay a reduced fee at time of service.  °[facility] ° [address],  (336) 641-4533 Patients are seen by appointment only. Walk-ins are not accepted. [facility] will see patients 18 years of age and older. °Monday - Tuesday (8am-5pm) °Most Wednesdays (8:30-5pm) °$30 per visit, cash only  °[facility] ° [address], [place] (336) 641-4533 Patients are seen by appointment only. Walk-ins are not accepted. [facility] will see patients 18 years of age and older. °One   Wednesday Evening (Monthly: Volunteer Based).  $30 per visit, cash only  °[facility]  (919) 537-3737 for adults; Children under age 4, call Graduate Pediatric Dentistry at (919) [phone]. Children aged 4-14, please call (919) [phone] to request a pediatric application. ° Dental services are provided in all areas of dental care including fillings, crowns and bridges, complete and partial dentures, implants, gum treatment, root canals, and extractions. Preventive care is also provided. Treatment is provided to both adults and children. °Patients are selected via a lottery and there is often a waiting list. °  °[facility] [address], °[place] ° (336) 763-8833 www.drcivils.[email or web address] °  °[facility] [address], [place], [place] (336)723-1848, Ext. 123 Second and Fourth Thursday of each month, opens at 6:30 AM; Clinic ends at 9 AM.  Patients are seen on a first-come first-served basis, and a limited number are seen during each clinic.  ° °[facility] ° [address], [place], [place] (336) 723-7904    Eligibility Requirements °You must have lived in [place], or [place] counties for at least the last three months. °  You cannot be eligible for state or federal sponsored healthcare insurance, including Veterans Administration, Medicaid, or Medicare. °  You generally cannot be eligible for healthcare insurance through your employer.  °  How to apply: °Eligibility screenings are held every Tuesday and Wednesday afternoon from 1:00 pm until 4:00 pm. You do not need an appointment for the interview!  °[facility] [address], [place], [place] 336-631-2330   °[facility]  336-342-8273   °[facility]  336-703-3100   °[facility]  336-570-6415   ° °Behavioral Health Resources in the Community: °Intensive Outpatient Programs °Organization         Address  Phone  Notes  °[facility] [address], [place], [place] 336-878-6098   °[facility] [address], [place], [place] 336-832-9800   °[facility] [address], [place], [place] ° 336-882-2125   °[facility] [address],  °[place], [place] 1-800-853-5163 or 336-641-4981   °Substance Abuse Resources °Organization         Address  Phone  Notes  °[facility]  336-882-2125   °[facility]  336-784-9470   °[facility]  336-285-9073   °[facility]  336-845-3988   °Residential & Outpatient Substance Abuse Program  1-800-659-3381   °Psychological Services °Organization         Address  Phone  Notes  °[facility]  336- 832-9600   °[facility]  336- 378-7881   °[facility] [address], [place] 1-800-853-5163 or 336-641-4981   ° °Mobile Crisis Teams °Organization         Address  Phone  Notes  °[facility], Mobile Crisis Care Unit  1-877-626-1772   °Assertive °[facility] ° [address]  [place], [place] 336-834-9664   °Sharon DeEsch [address] °[place] 336-554-5454   ° °Self-Help/Support Groups °Organization         Address  Phone             Notes  °[facility] [place] - variety of support groups  336- 373-1402 Call for more information  °Narcotics Anonymous (NA), [facility] [address], °[place]  2 meetings at this location  ° °  Residential Treatment Programs Organization         Address  Phone  Notes  [facility] [address],    [place]  1-670-455-6756   [facility]  [address] [place] [id number], [place], [place]   [facility] [place], [place] (585) [phone] Admissions: 8am-3pm M-F  [facility] [place] [address]    [place], [place] X4321937   [facility] [address] [place], [place], [place]   [facility] [address],  [place], [place]   [facility] Intensive Outpatient [address], [place], [place]   [facility] [place] ([place] [address]  [place], [place] 1-949 361 1742 or 816-096-4157   [facility] [address] [place], [place] Accepts Medicaid  [facility] [address]  [place] 1-734-850-2671 Substance Abuse/Addiction Treatment   [facility]         Address  Phone  Notes  [facility]  3477215630   Domenic Schwab, PhD [address] [place] [place], [place]   772 487 4451 or 706-752-0252   [facility], [place] (423)[phone]   [facility] [address], [place], [place] 416-357-9269 Insurance/Medicaid/sponsorship through [facility] [address]                                    [place], [place] [phone] [place] [address], [place] [phone]    Dr. Adele Schilder  ([phone]) [phone]   [facility] Dept. 1) [address], [place] 2) [place] 3)  [address], [place] [phone]  [phone]   [place] [phone] or [phone] (After Hours)      Use over the counter normal saline nasal spray, as instructed in the Emergency Department, several times per day for the next 2 weeks. Take the prescriptions as directed.  Use your albuterol inhaler (2 to 4 puffs) every 4 hours for the next 7 days, then as needed for cough, wheezing, or shortness of breath.  Call your regular medical doctor today to schedule a follow up appointment within the next 3 days.  Return to the Emergency Department immediately sooner if worsening.

## 2014-03-31 NOTE — ED Notes (Signed)
Pt states she has had a cough and congestion since Sunday. States she went to her doctor on Monday and was given a z pack, cough med and a shot. States she feels she is not getting any better

## 2014-03-31 NOTE — ED Notes (Signed)
Ambulated in hallway with steady gait. Denies Shortness of breath with walking. Oxygen saturations stayed 95-99% during ambulation.

## 2014-03-31 NOTE — ED Provider Notes (Signed)
CSN: [id number]     Arrival date & time 03/31/14  [id number] History   First MD Initiated Contact with Patient 03/31/14 1029     Chief Complaint  Patient presents with  . Cough      HPI Pt was seen at 1030.  Per pt, c/o gradual onset and persistence of constant runny/stuffy nose, sinus congestion, cough and generalized body aches/fatigue for the past 5 days. States she was evaluated by her PMD for same 4 days ago, "got a shot" in the office (does not recall what it was), rx Zithromax and "cough syrup." States her symptoms continue despite taking the meds as prescribed. Denies fevers, no rash, no CP/palpitations, no SOB, no N/V/D, no abd pain.     Past Medical History  Diagnosis Date  . Hypertension   . Hypothyroid   . Abdominal adhesions 1994  . Atrial fibrillation 10/2012    Associated with severe anemia and esophageal pill impaction  . Gastroesophageal reflux disease     Hiatal hernia  . Anxiety and depression   . Cholelithiasis     asymptomatic  . Nephrolithiasis 2004    painless hematuria  . Upper GI bleed 2004    Multiple episodes of melena-? due to gastritis or adverse drug effect (nonsteroidals, small bowel ulceration with Fosamax); caused by Pepto-Bismol during one Emergency Department evaluation  . Malabsorption     Short gut syndrome following small bowel resection surgery x2  . Breast carcinoma     right mastectomy "25+ years ago"  . Anemia   . Arthritis   . Low back pain   . Allergic rhinitis    Past Surgical History  Procedure Laterality Date  . Abdominal hysterectomy      emergency s/p delivery  . Bowel resection      Resulting short gut syndrome  . Colonoscopy w/ polypectomy  2005    Lipoma; diverticulosis  . Upper gastrointestinal endoscopy    . Mastectomy  right breast  . Colonoscopy with esophagogastroduodenoscopy (egd)  11/22/2012    [place]  . Mastectomy      Carcinoma of the breast; right  . Abdominal hysterectomy  1960    massive gynecologic  bleeding  . Laparoscopic lysis of adhesions  1965    s/p adhesions   Family History  Problem Relation Age of Onset  . Anuerysm Father   . Rheum arthritis Sister   . Healthy Sister   . COPD Sister   . Healthy Brother   . Cancer    . Colon cancer Neg Hx    History  Substance Use Topics  . Smoking status: Former Smoker -- 1.50 packs/day for 20 years    Types: Cigarettes  . Smokeless tobacco: Never Used  . Alcohol Use: No    Review of Systems ROS: Statement: All systems negative except as marked or noted in the HPI; Constitutional: Negative for fever and chills. +generalized body aches/fatigue.; ; Eyes: Negative for eye pain, redness and discharge. ; ; ENMT: Negative for ear pain, hoarseness, sore throat. +nasal congestion, sinus pressure and rhinorrhea.; ; Cardiovascular: Negative for chest pain, palpitations, diaphoresis, dyspnea and peripheral edema. ; ; Respiratory: +cough. Negative for wheezing and stridor. ; ; Gastrointestinal: Negative for nausea, vomiting, diarrhea, abdominal pain, blood in stool, hematemesis, jaundice and rectal bleeding. . ; ; Genitourinary: Negative for dysuria, flank pain and hematuria. ; ; Musculoskeletal: Negative for back pain and neck pain. Negative for swelling and trauma.; ; Skin: Negative for pruritus, rash, abrasions, blisters, bruising and  skin lesion.; ; Neuro: Negative for headache, lightheadedness and neck stiffness. Negative for weakness, altered level of consciousness , altered mental status, extremity weakness, paresthesias, involuntary movement, seizure and syncope.      Allergies  Codeine  Home Medications   Prior to Admission medications   Medication Sig Start Date End Date Taking? Authorizing Provider  ALPRAZolam Duanne Moron) 1 MG tablet Take 1 mg by mouth at bedtime as needed for sleep.   Yes Historical Provider, MD  azithromycin (ZITHROMAX) 250 MG tablet Take 1 tablet by mouth as directed. 03/27/14  Yes Historical Provider, MD  Calcium  Carbonate-Vitamin D (CALCIUM + D PO) Take 1 tablet by mouth daily.   Yes Historical Provider, MD  Cholecalciferol (VITAMIN D PO) Take 1,200 mg by mouth daily. Takes calcium and vitamin d   Yes Historical Provider, MD  Cyanocobalamin (VITAMIN B-12 IJ) Inject as directed every 30 (thirty) days.   Yes Historical Provider, MD  DULoxetine (CYMBALTA) 60 MG capsule Take 60 mg by mouth every morning.    Yes Historical Provider, MD  fluticasone (FLONASE) 50 MCG/ACT nasal spray Place 2 sprays into both nostrils daily. 03/13/14  Yes Historical Provider, MD  folic acid (FOLVITE) 1 MG tablet Take 1 mg by mouth daily.   Yes Historical Provider, MD  glucosamine-chondroitin 500-400 MG tablet Take 1 tablet by mouth every morning.    Yes Historical Provider, MD  HYDROcodone-homatropine (HYCODAN) 5-1.5 MG/5ML syrup Take 5 mLs by mouth daily as needed for cough.  03/27/14  Yes Historical Provider, MD  iron polysaccharides (NIFEREX) 150 MG capsule Take 1 capsule (150 mg total) by mouth every Monday, Wednesday, and Friday. 06/27/13  Yes Rogene Houston, MD  Lactobacillus (ACIDOPHILUS) 100 MG CAPS Take 100 mg by mouth every morning.    Yes Historical Provider, MD  levothyroxine (SYNTHROID, LEVOTHROID) 50 MCG tablet Take 50 mcg by mouth every morning.   Yes Historical Provider, MD  Multiple Vitamin (MULTIVITAMIN) tablet Take 1 tablet by mouth every morning.    Yes Historical Provider, MD  Omega-3 Fatty Acids (FISH OIL) 1000 MG CAPS Take 1,000 mg by mouth every morning.    Yes Historical Provider, MD  pantoprazole (PROTONIX) 40 MG tablet Take 40 mg by mouth daily. 10/02/13  Yes Historical Provider, MD   BP 153/76  Pulse 80  Temp(Src) 97.4 F (36.3 C) (Oral)  Resp 20  Ht 5\' 4"  (1.626 m)  Wt 120 lb (54.432 kg)  BMI 20.59 kg/m2  SpO2 97% Physical Exam 1035: Physical examination:  Nursing notes reviewed; Vital signs and O2 SAT reviewed;  Constitutional: Well developed, Well nourished, Well hydrated, In no acute distress;  Head:  Normocephalic, atraumatic; Eyes: EOMI, PERRL, No scleral icterus; ENMT: TM's clear bilat. +edemetous nasal turbinates bilat with clear rhinorrhea. Mouth and pharynx without lesions. No tonsillar exudates. No intra-oral edema. No submandibular or sublingual edema. No hoarse voice, no drooling, no stridor. No pain with manipulation of larynx. No trismus. Mouth and pharynx normal, Mucous membranes moist; Neck: Supple, Full range of motion, No lymphadenopathy; Cardiovascular: Regular rate and rhythm, No gallop; Respiratory: Breath sounds coarse & equal bilaterally, No wheezes.  Speaking full sentences with ease, Normal respiratory effort/excursion; Chest: Nontender, Movement normal; Abdomen: Soft, Nontender, Nondistended, Normal bowel sounds; Genitourinary: No CVA tenderness; Extremities: Pulses normal, No tenderness, No edema, No calf edema or asymmetry.; Neuro: AA&Ox3, Major CN grossly intact.  Speech clear. No gross focal motor or sensory deficits in extremities. Climbs on and off stretcher easily by herself. Gait steady.;  Skin: Color normal, Warm, Dry.   ED Course  Procedures     EKG Interpretation None      MDM  MDM Reviewed: previous chart, nursing note and vitals Interpretation: x-ray   Dg Chest 2 View 03/31/2014   CLINICAL DATA:  COUGH  EXAM: CHEST  2 VIEW  COMPARISON:  DG ABD ACUTE W/CHEST dated 10/04/2013  FINDINGS: The heart size and mediastinal contours are within normal limits. Lungs are hyperinflated and there is flattening of the hemidiaphragms. Stable chronic changes in the right hemithorax. Both lungs are otherwise clear. Surgical clips in the right axilla. The visualized skeletal structures are unremarkable.  IMPRESSION: No active cardiopulmonary disease.  Stable chronic changes.  COPD   Electronically Signed   By: Margaree Mackintosh M.D.   On: 03/31/2014 10:29    1230:  Pt states she "feels better" after neb.  NAD, lungs CTA bilat, no wheezing, resps easy, speaking full  sentences, Sats 96-97% R/A.  Pt ambulated around the ED with Sats remaining 95-99 % R/A, resps easy, NAD. Denies CP/SOB. VS remain stable. Pt states she wants to go home now. Will continue to tx symptomatically at this time. Dx and testing d/w pt.  Questions answered.  Verb understanding, agreeable to d/c home with outpt f/u.       Alfonzo Feller, DO 04/03/14 [phone]

## 2014-03-31 NOTE — ED Notes (Signed)
MD at bedside. 

## 2014-04-14 DIAGNOSIS — K625 Hemorrhage of anus and rectum: Secondary | ICD-10-CM | POA: Diagnosis not present

## 2014-04-14 LAB — CBC
HCT: 36.5 % (ref 36.0–46.0)
HEMOGLOBIN: 12.4 g/dL (ref 12.0–15.0)
MCH: 28.4 pg (ref 26.0–34.0)
MCHC: 34 g/dL (ref 30.0–36.0)
MCV: 83.7 fL (ref 78.0–100.0)
PLATELETS: 266 10*3/uL (ref 150–400)
RBC: 4.36 MIL/uL (ref 3.87–5.11)
RDW: 14.4 % (ref 11.5–15.5)
WBC: 8.4 10*3/uL (ref 4.0–10.5)

## 2014-05-12 DIAGNOSIS — D518 Other vitamin B12 deficiency anemias: Secondary | ICD-10-CM | POA: Diagnosis not present

## 2014-06-19 ENCOUNTER — Other Ambulatory Visit (HOSPITAL_COMMUNITY): Payer: Medicare Other

## 2014-06-22 ENCOUNTER — Ambulatory Visit (HOSPITAL_COMMUNITY): Payer: Medicare Other

## 2014-06-23 ENCOUNTER — Encounter (HOSPITAL_COMMUNITY): Payer: Self-pay

## 2014-06-27 ENCOUNTER — Encounter (INDEPENDENT_AMBULATORY_CARE_PROVIDER_SITE_OTHER): Payer: Self-pay | Admitting: Internal Medicine

## 2014-06-27 ENCOUNTER — Ambulatory Visit (INDEPENDENT_AMBULATORY_CARE_PROVIDER_SITE_OTHER): Payer: Medicare Other | Admitting: Internal Medicine

## 2014-06-27 VITALS — BP 126/74 | HR 76 | Temp 97.6°F | Resp 18 | Ht 64.0 in | Wt 124.4 lb

## 2014-06-27 DIAGNOSIS — K219 Gastro-esophageal reflux disease without esophagitis: Secondary | ICD-10-CM

## 2014-06-27 DIAGNOSIS — K912 Postsurgical malabsorption, not elsewhere classified: Secondary | ICD-10-CM | POA: Diagnosis not present

## 2014-06-27 DIAGNOSIS — D509 Iron deficiency anemia, unspecified: Secondary | ICD-10-CM

## 2014-06-27 NOTE — Patient Instructions (Signed)
Call if you have rectal bleeding or tarry stools. Call if diarrhea recurrs

## 2014-06-27 NOTE — Progress Notes (Signed)
Presenting complaint;  Followup for iron deficiency anemia, short gut syndrome and GERD.  Subjective:  Patient presents for yearly visit. She states she feels well. Lately she has noted some diarrhea she says she's not ready to go back on antibiotic. She's having no more than 2-3 stools per day. She denies abdominal pain melena or rectal bleeding. She says heart was well controlled with therapy. She has good appetite and her weight has been stable. She wonders if she can be switched from B12 injection 2 pills. She states she is staying busy. She is helping her sister care for her husband who has dementia. She does not take OTC NSAIDs.   Current Medications: Outpatient Encounter Prescriptions as of 06/27/2014  Medication Sig  . ALPRAZolam (XANAX) 1 MG tablet Take 1 mg by mouth at bedtime as needed for sleep.  . Calcium Carbonate-Vitamin D (CALCIUM + D PO) Take 1 tablet by mouth daily.  . Cholecalciferol (VITAMIN D PO) Take 1,200 mg by mouth daily. Takes calcium and vitamin d  . Cyanocobalamin (VITAMIN B-12 IJ) Inject as directed every 30 (thirty) days.  . DULoxetine (CYMBALTA) 60 MG capsule Take 60 mg by mouth every morning.   . folic acid (FOLVITE) 1 MG tablet Take 1 mg by mouth daily.  Marland Kitchen glucosamine-chondroitin 500-400 MG tablet Take 1 tablet by mouth every morning.   . iron polysaccharides (NIFEREX) 150 MG capsule Take 1 capsule (150 mg total) by mouth every Monday, Wednesday, and Friday.  . Lactobacillus (ACIDOPHILUS) 100 MG CAPS Take 100 mg by mouth every morning.   Marland Kitchen levothyroxine (SYNTHROID, LEVOTHROID) 50 MCG tablet Take 50 mcg by mouth every morning.  . Multiple Vitamin (MULTIVITAMIN) tablet Take 1 tablet by mouth every morning.   . Omega-3 Fatty Acids (FISH OIL) 1000 MG CAPS Take 1,000 mg by mouth every morning.   . pantoprazole (PROTONIX) 40 MG tablet Take 40 mg by mouth daily.  . [DISCONTINUED] azithromycin (ZITHROMAX) 250 MG tablet Take 1 tablet by mouth as directed.  .  [DISCONTINUED] benzonatate (TESSALON) 100 MG capsule Take 1 capsule (100 mg total) by mouth 3 (three) times daily as needed for cough.  . [DISCONTINUED] fluticasone (FLONASE) 50 MCG/ACT nasal spray Place 2 sprays into both nostrils daily.  . [DISCONTINUED] HYDROcodone-homatropine (HYCODAN) 5-1.5 MG/5ML syrup Take 5 mLs by mouth daily as needed for cough.      Objective: Blood pressure 126/74, pulse 76, temperature 97.6 F (36.4 C), temperature source Oral, resp. rate 18, height 5\' 4"  (1.626 m), weight 124 lb 6.4 oz (56.427 kg). Patient is alert and in no acute distress. Conjunctiva is pink. Sclera is nonicteric Oropharyngeal mucosa is normal. No neck masses or thyromegaly noted. Cardiac exam with regular rhythm normal S1 and S2. She has short systolic murmur at aortic area. Lungs are clear to auscultation. Abdomen is flat with extensive scarring. Bowel sounds are normal. Abdomen is soft and nontender without organomegaly or masses. No LE edema or clubbing noted.  Labs/studies Results: CBC from 04/14/2014  WBC 8.4, H&H 12.4 and 36.5, MCV 83.7 and platelet count 266K.     Assessment:  #1. GERD. She is doing well with therapy. #2. History of iron deficiency anemia. She has remote history of peptic ulcer disease secondary to NSAID and she had normal EGD in December 2013 and colonoscopy revealing sigmoid diverticula and external hemorrhoids. She possibly has small bowel AV malformations. H&H is normal and no evidence of overt bleeding. Therefore no need for further workup but she should continue taking  iron daily. #3. History of diarrhea secondary to short gut syndrome and small intestine bacterial. She had more than 16 feet of small bowel resected following trauma over 20 years ago. Over the years she has required antibiotics less frequently.   Plan: Patient reminded that she cannot take NSAIDs. Patient advised to continue B12 injection parenterally every month as he is doing go switching  to oral vitamin B12 may not work since most of her small bowel has been removed. Patient will call if diarrhea worsens or if she has melena or rectal bleeding. Office visit in one year.

## 2014-09-12 ENCOUNTER — Encounter (HOSPITAL_COMMUNITY): Payer: Self-pay | Admitting: Emergency Medicine

## 2014-09-12 ENCOUNTER — Emergency Department (HOSPITAL_COMMUNITY)
Admission: EM | Admit: 2014-09-12 | Discharge: 2014-09-12 | Disposition: A | Discharge status: Home / Self Care | Payer: Medicare Other | Attending: Emergency Medicine | Admitting: Emergency Medicine

## 2014-09-12 DIAGNOSIS — D649 Anemia, unspecified: Secondary | ICD-10-CM | POA: Diagnosis not present

## 2014-09-12 DIAGNOSIS — E039 Hypothyroidism, unspecified: Secondary | ICD-10-CM | POA: Diagnosis not present

## 2014-09-12 DIAGNOSIS — K219 Gastro-esophageal reflux disease without esophagitis: Secondary | ICD-10-CM | POA: Diagnosis not present

## 2014-09-12 DIAGNOSIS — I10 Essential (primary) hypertension: Secondary | ICD-10-CM | POA: Diagnosis not present

## 2014-09-12 DIAGNOSIS — Z8739 Personal history of other diseases of the musculoskeletal system and connective tissue: Secondary | ICD-10-CM | POA: Diagnosis not present

## 2014-09-12 DIAGNOSIS — F3289 Other specified depressive episodes: Secondary | ICD-10-CM | POA: Diagnosis not present

## 2014-09-12 DIAGNOSIS — K1120 Sialoadenitis, unspecified: Secondary | ICD-10-CM | POA: Insufficient documentation

## 2014-09-12 DIAGNOSIS — F411 Generalized anxiety disorder: Secondary | ICD-10-CM | POA: Insufficient documentation

## 2014-09-12 DIAGNOSIS — F329 Major depressive disorder, single episode, unspecified: Secondary | ICD-10-CM | POA: Insufficient documentation

## 2014-09-12 DIAGNOSIS — Z853 Personal history of malignant neoplasm of breast: Secondary | ICD-10-CM | POA: Insufficient documentation

## 2014-09-12 DIAGNOSIS — Z87891 Personal history of nicotine dependence: Secondary | ICD-10-CM | POA: Insufficient documentation

## 2014-09-12 DIAGNOSIS — Z87442 Personal history of urinary calculi: Secondary | ICD-10-CM | POA: Diagnosis not present

## 2014-09-12 DIAGNOSIS — Z8709 Personal history of other diseases of the respiratory system: Secondary | ICD-10-CM | POA: Insufficient documentation

## 2014-09-12 DIAGNOSIS — R6884 Jaw pain: Secondary | ICD-10-CM | POA: Diagnosis not present

## 2014-09-12 DIAGNOSIS — Z79899 Other long term (current) drug therapy: Secondary | ICD-10-CM | POA: Diagnosis not present

## 2014-09-12 MED ORDER — CEPHALEXIN 500 MG PO TABS: 500.0000 mg | ORAL_TABLET | Freq: Four times a day (QID) | ORAL | Status: DC

## 2014-09-12 NOTE — ED Provider Notes (Signed)
CSN: [id number]     Arrival date & time 09/12/14  [id number] History   First MD Initiated Contact with Patient 09/12/14 [phone]     Chief Complaint  Patient presents with  . Jaw Pain     (Consider location/radiation/quality/duration/timing/severity/associated sxs/prior Treatment) The history is provided by the patient.   Jane Andrews is a 80 y.o. female presenting with a one day history of slowly progressing left jaw pain.  She states the pain started late morning yesterday several hours after eating breakfast.  Her pain is constant but worse with movement and has not responded to ibuprofen, aleve or hydrocodone, all medicines she took yesterday.  She denies any dental problems.  She feels the jaw is swollen.  She denies fevers, chills, chest pain, shortness of breath, headache or neck pain and has had no trauma to her face or jaw.  Her hearing acuity is unchanged and has had no drainage from her ear. She states used to take Fosamax but was taken off as it caused "crunching" sensation in this same joint.      Past Medical History  Diagnosis Date  . Hypertension   . Hypothyroid   . Abdominal adhesions 1994  . Atrial fibrillation 10/2012    Associated with severe anemia and esophageal pill impaction  . Gastroesophageal reflux disease     Hiatal hernia  . Anxiety and depression   . Cholelithiasis     asymptomatic  . Nephrolithiasis 2004    painless hematuria  . Upper GI bleed 2004    Multiple episodes of melena-? due to gastritis or adverse drug effect (nonsteroidals, small bowel ulceration with Fosamax); caused by Pepto-Bismol during one Emergency Department evaluation  . Malabsorption     Short gut syndrome following small bowel resection surgery x2  . Breast carcinoma     right mastectomy "25+ years ago"  . Anemia   . Arthritis   . Low back pain   . Allergic rhinitis    Past Surgical History  Procedure Laterality Date  . Abdominal hysterectomy      emergency s/p delivery   . Bowel resection      Resulting short gut syndrome  . Colonoscopy w/ polypectomy  2005    Lipoma; diverticulosis  . Upper gastrointestinal endoscopy    . Mastectomy  right breast  . Colonoscopy with esophagogastroduodenoscopy (egd)  11/22/2012    [facility]  . Mastectomy      Carcinoma of the breast; right  . Abdominal hysterectomy  1960    massive gynecologic bleeding  . Laparoscopic lysis of adhesions  1965    s/p adhesions   Family History  Problem Relation Age of Onset  . Anuerysm Father   . Rheum arthritis Sister   . Healthy Sister   . COPD Sister   . Healthy Brother   . Cancer    . Colon cancer Neg Hx    History  Substance Use Topics  . Smoking status: Former Smoker -- 1.50 packs/day for 20 years    Types: Cigarettes  . Smokeless tobacco: Never Used  . Alcohol Use: No   OB History   Grav Para Term Preterm Abortions TAB SAB Ect Mult Living                 Review of Systems  Constitutional: Negative for fever and chills.  HENT: Negative for congestion, mouth sores, sinus pressure, sore throat and tinnitus.   Eyes: Negative.   Respiratory: Negative for chest tightness and shortness of  breath.   Cardiovascular: Negative for chest pain.  Gastrointestinal: Negative for nausea and abdominal pain.  Genitourinary: Negative.   Musculoskeletal: Positive for arthralgias. Negative for joint swelling and neck pain.  Skin: Negative.  Negative for rash and wound.  Neurological: Negative for dizziness, weakness, light-headedness, numbness and headaches.  Psychiatric/Behavioral: Negative.       Allergies  Codeine  Home Medications   Prior to Admission medications   Medication Sig Start Date End Date Taking? Authorizing Provider  ALPRAZolam Duanne Moron) 1 MG tablet Take 1 mg by mouth at bedtime as needed for sleep.    Historical Provider, MD  Calcium Carbonate-Vitamin D (CALCIUM + D PO) Take 1 tablet by mouth daily.    Historical Provider, MD  Cephalexin 500 MG tablet Take 1  tablet (500 mg total) by mouth 4 (four) times daily. 09/12/14   Evalee Jefferson, PA-C  Cholecalciferol (VITAMIN D PO) Take 1,200 mg by mouth daily. Takes calcium and vitamin d    Historical Provider, MD  Cyanocobalamin (VITAMIN B-12 IJ) Inject as directed every 30 (thirty) days.    Historical Provider, MD  DULoxetine (CYMBALTA) 60 MG capsule Take 60 mg by mouth every morning.     Historical Provider, MD  folic acid (FOLVITE) 1 MG tablet Take 1 mg by mouth daily.    Historical Provider, MD  glucosamine-chondroitin 500-400 MG tablet Take 1 tablet by mouth every morning.     Historical Provider, MD  iron polysaccharides (NIFEREX) 150 MG capsule Take 1 capsule (150 mg total) by mouth every Monday, Wednesday, and Friday. 06/27/13   Rogene Houston, MD  Lactobacillus (ACIDOPHILUS) 100 MG CAPS Take 100 mg by mouth every morning.     Historical Provider, MD  levothyroxine (SYNTHROID, LEVOTHROID) 50 MCG tablet Take 50 mcg by mouth every morning.    Historical Provider, MD  Multiple Vitamin (MULTIVITAMIN) tablet Take 1 tablet by mouth every morning.     Historical Provider, MD  Omega-3 Fatty Acids (FISH OIL) 1000 MG CAPS Take 1,000 mg by mouth every morning.     Historical Provider, MD  pantoprazole (PROTONIX) 40 MG tablet Take 40 mg by mouth daily. 10/02/13   Historical Provider, MD   BP 152/69  Pulse 71  Temp(Src) 98.3 F (36.8 C) (Oral)  Resp 18  Ht 5\' 4"  (1.626 m)  Wt 120 lb (54.432 kg)  BMI 20.59 kg/m2  SpO2 98% Physical Exam  Nursing note and vitals reviewed. Constitutional: She appears well-developed and well-nourished.  HENT:  Head: Normocephalic and atraumatic.  Tender to palpation left TM joint.  Nocrepitus with ROM of jaw.  Teeth appear stable, no obvious dental infection,  Left upper partial.  Mild parotid edema.  Eyes: Conjunctivae are normal.  Neck: Normal range of motion.  Cardiovascular: Normal rate, regular rhythm, normal heart sounds and intact distal pulses.   Pulmonary/Chest: Effort  normal and breath sounds normal. She has no wheezes.  Abdominal: Soft. Bowel sounds are normal. There is no tenderness.  Musculoskeletal: Normal range of motion.  Neurological: She is alert.  Skin: Skin is warm and dry.  Psychiatric: She has a normal mood and affect.    ED Course  Procedures (including critical care time) Labs Review Labs Reviewed - No data to display  Imaging Review No results found.   EKG Interpretation None      MDM   Final diagnoses:  Parotitis    Pt was also seen by Dr. Christy Gentles.  Possible early parotitis.  Will cover with Keflex, warm  compresses.  F/u with pcp if not improved over 1 week.    Evalee Jefferson, PA-C 09/12/14 [phone]

## 2014-09-12 NOTE — ED Provider Notes (Signed)
Medical screening examination/treatment/procedure(s) were conducted as a shared visit with non-physician practitioner(s) and myself.  I personally evaluated the patient during the encounter.   EKG Interpretation None        Sharyon Cable, MD 09/12/14 1201

## 2014-09-12 NOTE — Discharge Instructions (Signed)
Parotitis Parotitis is soreness and inflammation of one or both parotid glands. The parotid glands produce saliva. They are located on each side of the face, below and in front of the earlobes. The saliva produced comes out of tiny openings (ducts) inside the cheeks. In most cases, parotitis goes away over time or with treatment. If your parotitis is caused by certain long-term (chronic) diseases, it may come back again.  CAUSES  Parotitis can be caused by:  Viral infections. Mumps is one viral infection that can cause parotitis.  Bacterial infections.  Blockage of the salivary ducts due to a salivary stone.  Narrowing of the salivary ducts.  Swelling of the salivary ducts.  Dehydration.  Autoimmune conditions, such as sarcoidosis or Sjogren syndrome.  Air from activities such as scuba diving, glass blowing, or playing an instrument (rare).  Human immunodeficiency virus (HIV) or acquired immunodeficiency syndrome (AIDS).  Tuberculosis. SIGNS AND SYMPTOMS   The ears may appear to be pushed up and out from their normal position.  Redness (erythema) of the skin over the parotid glands.  Pain and tenderness over the parotid glands.  Swelling in the parotid gland area.  Yellowish-white fluid (pus) coming from the ducts inside the cheeks.  Dry mouth.  Bad taste in the mouth. DIAGNOSIS  Your health care provider may determine that you have parotitis based on your symptoms and a physical exam. A sample of fluid may also be taken from the parotid gland and tested to find the cause of your infection. X-rays or computed tomography (CT) scans may be taken if your health care provider thinks you might have a salivary stone blocking your salivary duct. TREATMENT  Treatment varies depending upon the cause of your parotitis. If your parotitis is caused by mumps, no treatment is needed. The condition will go away on its own after 7 to 10 days. In other cases, treatment may  include:  Antibiotic medicine if your infection was caused by bacteria.  Pain medicines.  Gland massage.  Eating sour candy to increase your saliva production.  Removal of salivary stones. Your health care provider may flush stones out with fluids or remove them with tweezers.  Surgery to remove the parotid glands. HOME CARE INSTRUCTIONS   If you were prescribed an antibiotic medicine, finish it all even if you start to feel better.  Put warm compresses on the sore area.  Take medicines only as directed by your health care provider.  Drink enough fluids to keep your urine clear or pale yellow. SEEK IMMEDIATE MEDICAL CARE IF:   You have increasing pain or swelling that is not controlled with medicine.  You have a fever. MAKE SURE YOU:  Understand these instructions.  Will watch your condition.  Will get help right away if you are not doing well or get worse. Document Released: 05/23/2002 Document Revised: 04/17/2014 Document Reviewed: 10/27/2011 [facility] Patient Information 2015 [place], [place]. This information is not intended to replace advice given to you by your health care provider. Make sure you discuss any questions you have with your health care provider.  Complete the entire course of antibiotics. Warm compresses may also help with pain and swelling.  See your doctor if not improved over the next week, sooner for any worsened symptoms.

## 2014-09-12 NOTE — ED Notes (Addendum)
PT c/o left jaw pain x1 day with swelling noted to exterior jaw. PT denies any dental problems at this time. PT states she took 300mg  ibuprofen and a hydrocodone this am for pain and the dentist couldn't work her in until this evening so she came to the ED.

## 2014-09-12 NOTE — ED Provider Notes (Signed)
Pt with mild swelling to left pre-auricular region without erythema/streaking Left TM clear and intact.  Left ear canal is clear Ears symmetric bilaterally No trismus.  No evidence of intra-oral infection Appropriate for d/c home    Date: 09/12/2014 0903am  Rate: 72  Rhythm: normal sinus rhythm  QRS Axis: normal  Intervals: normal  ST/T Wave abnormalities: nonspecific ST changes  Conduction Disutrbances:none      Sharyon Cable, MD 09/12/14 [phone]

## 2014-09-18 ENCOUNTER — Encounter (HOSPITAL_COMMUNITY): Payer: Self-pay | Admitting: Emergency Medicine

## 2014-09-18 ENCOUNTER — Inpatient Hospital Stay (HOSPITAL_COMMUNITY)
Admission: EM | Admit: 2014-09-18 | Discharge: 2014-09-21 | DRG: 392 | Disposition: A | Discharge status: Home / Self Care | Payer: Medicare Other | Attending: Internal Medicine | Admitting: Internal Medicine

## 2014-09-18 DIAGNOSIS — Z87442 Personal history of urinary calculi: Secondary | ICD-10-CM

## 2014-09-18 DIAGNOSIS — D649 Anemia, unspecified: Secondary | ICD-10-CM | POA: Diagnosis present

## 2014-09-18 DIAGNOSIS — N183 Chronic kidney disease, stage 3 unspecified: Secondary | ICD-10-CM | POA: Diagnosis present

## 2014-09-18 DIAGNOSIS — N200 Calculus of kidney: Secondary | ICD-10-CM | POA: Diagnosis present

## 2014-09-18 DIAGNOSIS — N201 Calculus of ureter: Secondary | ICD-10-CM | POA: Diagnosis not present

## 2014-09-18 DIAGNOSIS — R1084 Generalized abdominal pain: Secondary | ICD-10-CM | POA: Diagnosis not present

## 2014-09-18 DIAGNOSIS — Z90710 Acquired absence of both cervix and uterus: Secondary | ICD-10-CM

## 2014-09-18 DIAGNOSIS — I4891 Unspecified atrial fibrillation: Secondary | ICD-10-CM | POA: Diagnosis present

## 2014-09-18 DIAGNOSIS — Z853 Personal history of malignant neoplasm of breast: Secondary | ICD-10-CM | POA: Diagnosis not present

## 2014-09-18 DIAGNOSIS — E039 Hypothyroidism, unspecified: Secondary | ICD-10-CM | POA: Diagnosis not present

## 2014-09-18 DIAGNOSIS — K219 Gastro-esophageal reflux disease without esophagitis: Secondary | ICD-10-CM | POA: Diagnosis present

## 2014-09-18 DIAGNOSIS — K5669 Other intestinal obstruction: Secondary | ICD-10-CM | POA: Diagnosis present

## 2014-09-18 DIAGNOSIS — Z9011 Acquired absence of right breast and nipple: Secondary | ICD-10-CM | POA: Diagnosis present

## 2014-09-18 DIAGNOSIS — Z825 Family history of asthma and other chronic lower respiratory diseases: Secondary | ICD-10-CM | POA: Diagnosis not present

## 2014-09-18 DIAGNOSIS — K599 Functional intestinal disorder, unspecified: Secondary | ICD-10-CM | POA: Diagnosis not present

## 2014-09-18 DIAGNOSIS — I129 Hypertensive chronic kidney disease with stage 1 through stage 4 chronic kidney disease, or unspecified chronic kidney disease: Secondary | ICD-10-CM | POA: Diagnosis present

## 2014-09-18 DIAGNOSIS — K5900 Constipation, unspecified: Secondary | ICD-10-CM

## 2014-09-18 DIAGNOSIS — K8020 Calculus of gallbladder without cholecystitis without obstruction: Secondary | ICD-10-CM | POA: Diagnosis present

## 2014-09-18 DIAGNOSIS — K5660 Unspecified intestinal obstruction: Secondary | ICD-10-CM | POA: Diagnosis not present

## 2014-09-18 DIAGNOSIS — M1990 Unspecified osteoarthritis, unspecified site: Secondary | ICD-10-CM | POA: Diagnosis present

## 2014-09-18 DIAGNOSIS — K7689 Other specified diseases of liver: Secondary | ICD-10-CM | POA: Diagnosis present

## 2014-09-18 DIAGNOSIS — R109 Unspecified abdominal pain: Secondary | ICD-10-CM | POA: Diagnosis present

## 2014-09-18 DIAGNOSIS — I10 Essential (primary) hypertension: Secondary | ICD-10-CM

## 2014-09-18 DIAGNOSIS — Z87891 Personal history of nicotine dependence: Secondary | ICD-10-CM

## 2014-09-18 DIAGNOSIS — N184 Chronic kidney disease, stage 4 (severe): Secondary | ICD-10-CM | POA: Diagnosis present

## 2014-09-18 DIAGNOSIS — K912 Postsurgical malabsorption, not elsewhere classified: Secondary | ICD-10-CM | POA: Diagnosis not present

## 2014-09-18 LAB — COMPREHENSIVE METABOLIC PANEL
ALK PHOS: 67 U/L (ref 39–117)
ALT: 12 U/L (ref 0–35)
AST: 17 U/L (ref 0–37)
Albumin: 4.1 g/dL (ref 3.5–5.2)
Anion gap: 14 (ref 5–15)
BUN: 19 mg/dL (ref 6–23)
CALCIUM: 10.2 mg/dL (ref 8.4–10.5)
CHLORIDE: 105 meq/L (ref 96–112)
CO2: 24 mEq/L (ref 19–32)
Creatinine, Ser: 1.24 mg/dL — ABNORMAL HIGH (ref 0.50–1.10)
GFR calc Af Amer: 44 mL/min — ABNORMAL LOW (ref >90)
GFR calc non Af Amer: 38 mL/min — ABNORMAL LOW (ref >90)
GLUCOSE: 94 mg/dL (ref 70–99)
POTASSIUM: 4.9 meq/L (ref 3.7–5.3)
SODIUM: 143 meq/L (ref 137–147)
Total Bilirubin: 0.3 mg/dL (ref 0.3–1.2)
Total Protein: 7.5 g/dL (ref 6.0–8.3)

## 2014-09-18 LAB — CBC WITH DIFFERENTIAL/PLATELET
BASOS ABS: 0 10*3/uL (ref 0.0–0.1)
Basophils Relative: 0 % (ref 0–1)
Eosinophils Absolute: 0.5 10*3/uL (ref 0.0–0.7)
Eosinophils Relative: 7 % — ABNORMAL HIGH (ref 0–5)
HCT: 37.9 % (ref 36.0–46.0)
Hemoglobin: 12.6 g/dL (ref 12.0–15.0)
LYMPHS ABS: 1.5 10*3/uL (ref 0.7–4.0)
LYMPHS PCT: 21 % (ref 12–46)
MCH: 29.4 pg (ref 26.0–34.0)
MCHC: 33.2 g/dL (ref 30.0–36.0)
MCV: 88.3 fL (ref 78.0–100.0)
Monocytes Absolute: 0.8 10*3/uL (ref 0.1–1.0)
Monocytes Relative: 12 % (ref 3–12)
NEUTROS PCT: 60 % (ref 43–77)
Neutro Abs: 4.3 10*3/uL (ref 1.7–7.7)
Platelets: 211 10*3/uL (ref 150–400)
RBC: 4.29 MIL/uL (ref 3.87–5.11)
RDW: 13 % (ref 11.5–15.5)
WBC: 7.1 10*3/uL (ref 4.0–10.5)

## 2014-09-18 MED ORDER — MORPHINE SULFATE 2 MG/ML IJ SOLN
2.0000 mg | INTRAMUSCULAR | Status: DC | PRN
  Administered 2014-09-19 (×2): 2 mg via INTRAVENOUS
  Filled 2014-09-18 (×2): qty 1

## 2014-09-18 MED ORDER — ONDANSETRON HCL 4 MG/2ML IJ SOLN
4.0000 mg | Freq: Once | INTRAMUSCULAR | Status: AC
  Administered 2014-09-19: 4 mg via INTRAVENOUS
  Filled 2014-09-18: qty 2

## 2014-09-18 MED ORDER — SODIUM CHLORIDE 0.9 % IV SOLN: INTRAVENOUS | Status: DC

## 2014-09-18 MED ORDER — SODIUM CHLORIDE 0.9 % IV BOLUS (SEPSIS)
500.0000 mL | Freq: Once | INTRAVENOUS | Status: AC
  Administered 2014-09-19: via INTRAVENOUS

## 2014-09-18 NOTE — ED Notes (Signed)
Mid abd pain, onset  To day, No NVD. Hx of "bowel blockage due to adhesions"

## 2014-09-18 NOTE — ED Provider Notes (Signed)
CSN: [id number]     Arrival date & time 09/18/14  1959 History  This chart was scribed for Richarda Blade, MD by Tula Nakayama, ED Scribe. This patient was seen in room APA08/[facility]08 and the patient's care was started at 11:52 PM.    Chief Complaint  Patient presents with  . Abdominal Pain   The history is provided by the patient. No language interpreter was used.   HPI Comments: Jane Andrews is a 80 y.o. female who presents to the Emergency Department complaining of mild, constant abdominal pain that started 12 hours ago. Pt states a history of prior symptoms when she had a bowel blockage due to adhesions. Pt denies nausea, vomiting, fever, cough, CP, dizziness or flatulence as associated symptoms. Pt also denies any issues with BM and states last BM was yesterday.  Pt was seen in the ED on 09/12/2014 and was prescribed Cephalexin for diagnosis of Parotitis. Pt states that antibiotics caused diarrhea and cramping abdominal pain so she stopped treatment. She took pepto bismol with no relief to symptoms. Pt has hx of hysterectomy and abdominal adhesions. Pt also has history of bowel obstruction and was admitted one year ago for similar symptoms. She states that bowel obstruction cleared on its own. There are no other known modifying factors.   Past Medical History  Diagnosis Date  . Hypertension   . Hypothyroid   . Abdominal adhesions 1994  . Atrial fibrillation 10/2012    Associated with severe anemia and esophageal pill impaction  . Gastroesophageal reflux disease     Hiatal hernia  . Anxiety and depression   . Cholelithiasis     asymptomatic  . Upper GI bleed 2004    Multiple episodes of melena-? due to gastritis or adverse drug effect (nonsteroidals, small bowel ulceration with Fosamax); caused by Pepto-Bismol during one Emergency Department evaluation  . Malabsorption     Short gut syndrome following small bowel resection surgery x2  . Breast carcinoma     right mastectomy  "25+ years ago"  . Anemia   . Arthritis   . Low back pain   . Allergic rhinitis   . Nephrolithiasis 2004    painless hematuria   Past Surgical History  Procedure Laterality Date  . Abdominal hysterectomy      emergency s/p delivery  . Bowel resection      Resulting short gut syndrome  . Colonoscopy w/ polypectomy  2005    Lipoma; diverticulosis  . Upper gastrointestinal endoscopy    . Mastectomy  right breast  . Colonoscopy with esophagogastroduodenoscopy (egd)  11/22/2012    [facility]  . Mastectomy      Carcinoma of the breast; right  . Abdominal hysterectomy  1960    massive gynecologic bleeding  . Laparoscopic lysis of adhesions  1965    s/p adhesions   Family History  Problem Relation Age of Onset  . Anuerysm Father   . Rheum arthritis Sister   . Healthy Sister   . COPD Sister   . Healthy Brother   . Cancer    . Colon cancer Neg Hx    History  Substance Use Topics  . Smoking status: Former Smoker -- 1.50 packs/day for 20 years    Types: Cigarettes  . Smokeless tobacco: Never Used  . Alcohol Use: No   OB History   Grav Para Term Preterm Abortions TAB SAB Ect Mult Living  Review of Systems  Respiratory: Negative for cough.   Cardiovascular: Negative for chest pain.  Gastrointestinal: Positive for abdominal pain. Negative for nausea, vomiting and constipation.  Neurological: Negative for dizziness.  All other systems reviewed and are negative.   Allergies  Codeine  Home Medications   Prior to Admission medications   Medication Sig Start Date End Date Taking? Authorizing Provider  ALPRAZolam Duanne Moron) 1 MG tablet Take 1 mg by mouth at bedtime as needed for sleep.    Historical Provider, MD  Calcium Carbonate-Vitamin D (CALCIUM + D PO) Take 1 tablet by mouth daily.    Historical Provider, MD  Cephalexin 500 MG tablet Take 1 tablet (500 mg total) by mouth 4 (four) times daily. 09/12/14   Evalee Jefferson, PA-C  Cholecalciferol (VITAMIN D PO) Take  1,200 mg by mouth daily. Takes calcium and vitamin d    Historical Provider, MD  Cyanocobalamin (VITAMIN B-12 IJ) Inject as directed every 30 (thirty) days.    Historical Provider, MD  DULoxetine (CYMBALTA) 60 MG capsule Take 60 mg by mouth every morning.     Historical Provider, MD  folic acid (FOLVITE) 1 MG tablet Take 1 mg by mouth daily.    Historical Provider, MD  glucosamine-chondroitin 500-400 MG tablet Take 1 tablet by mouth every morning.     Historical Provider, MD  iron polysaccharides (NIFEREX) 150 MG capsule Take 1 capsule (150 mg total) by mouth every Monday, Wednesday, and Friday. 06/27/13   Rogene Houston, MD  Lactobacillus (ACIDOPHILUS) 100 MG CAPS Take 100 mg by mouth every morning.     Historical Provider, MD  levothyroxine (SYNTHROID, LEVOTHROID) 50 MCG tablet Take 50 mcg by mouth every morning.    Historical Provider, MD  Multiple Vitamin (MULTIVITAMIN) tablet Take 1 tablet by mouth every morning.     Historical Provider, MD  Omega-3 Fatty Acids (FISH OIL) 1000 MG CAPS Take 1,000 mg by mouth every morning.     Historical Provider, MD  pantoprazole (PROTONIX) 40 MG tablet Take 40 mg by mouth daily. 10/02/13   Historical Provider, MD   BP 139/67  Pulse 88  Temp(Src) 98 F (36.7 C) (Oral)  Resp 18  Ht 5\' 4"  (1.626 m)  Wt 120 lb (54.432 kg)  BMI 20.59 kg/m2  SpO2 97% Physical Exam  Nursing note and vitals reviewed. Constitutional: She is oriented to person, place, and time. She appears well-developed and well-nourished. No distress.  HENT:  Head: Normocephalic and atraumatic.  Eyes: Conjunctivae and EOM are normal. Pupils are equal, round, and reactive to light.  Neck: Normal range of motion and phonation normal. Neck supple.  Cardiovascular: Normal rate, regular rhythm and normal heart sounds.   Pulmonary/Chest: Effort normal and breath sounds normal. She exhibits no tenderness.  Abdominal: Soft. She exhibits no distension. There is tenderness. There is no guarding.   Hyperactive bowel sounds and mild diffuse tenderness  Genitourinary:  There is a small amount of brown stool in the rectum without fecal impaction, mass or anal abnormality.  Musculoskeletal: Normal range of motion.  Neurological: She is alert and oriented to person, place, and time. She exhibits normal muscle tone.  Skin: Skin is warm and dry.  Psychiatric: She has a normal mood and affect. Her behavior is normal. Judgment and thought content normal.    ED Course  Procedures (including critical care time) Medications  0.9 %  sodium chloride infusion (not administered)  morphine 2 MG/ML injection 2 mg (2 mg Intravenous Given 09/19/14 0226)  iohexol (OMNIPAQUE) 300 MG/ML solution 50 mL (not administered)  sodium chloride 0.9 % bolus 500 mL ( Intravenous New Bag/Given 09/19/14 0013)  ondansetron (ZOFRAN) injection 4 mg (4 mg Intravenous Given 09/19/14 0014)  iohexol (OMNIPAQUE) 300 MG/ML solution 100 mL (80 mLs Intravenous Contrast Given 09/19/14 0153)  ondansetron (ZOFRAN) injection 4 mg (4 mg Intravenous Given 09/19/14 0359)    Patient Vitals for the past 24 hrs:  BP Temp Temp src Pulse Resp SpO2 Height Weight  09/19/14 0436 139/67 mmHg - - 88 18 97 % - -  09/18/14 2020 134/84 mmHg 98 F (36.7 C) Oral 83 16 98 % 5\' 4"  (1.626 m) 120 lb (54.432 kg)    4:36 AM Reevaluation with update and discussion. After initial assessment and treatment, an updated evaluation reveals she remains uncomfortable. She required another dose of Zofran, at 0400, for vomiting. Findings discussed with the patient. She states that she usually has diarrhea and is not troubled by constipation, chronically. She states that she lives alone, and does not feel like she can manage herself at this time. Jane Andrews L   4:39 AM-Consult complete with Dr. [place] Pink. Patient case explained and discussed. He agrees to admit patient for further evaluation and treatment. Call ended at 0445  DIAGNOSTIC STUDIES: Oxygen Saturation  is 98% on RA, normal by my interpretation.    COORDINATION OF CARE: 12:00 AM Will order CT A/P with contrast and lab work. Discussed treatment plan with pt at bedside and pt agreed to plan.  Labs Review Labs Reviewed  CBC WITH DIFFERENTIAL - Abnormal; Notable for the following:    Eosinophils Relative 7 (*)    All other components within normal limits  COMPREHENSIVE METABOLIC PANEL - Abnormal; Notable for the following:    Creatinine, Ser 1.24 (*)    GFR calc non Af Amer 38 (*)    GFR calc Af Amer 44 (*)    All other components within normal limits  URINALYSIS, ROUTINE W REFLEX MICROSCOPIC - Abnormal; Notable for the following:    Specific Gravity, Urine <1.005 (*)    Hgb urine dipstick LARGE (*)    All other components within normal limits  URINE MICROSCOPIC-ADD ON    Imaging Review Ct Abdomen Pelvis W Contrast  09/19/2014   CLINICAL DATA:  Acute onset of mid abdominal pain for 12 hours. Personal history of small bowel obstruction due to adhesions. Initial encounter.  EXAM: CT ABDOMEN AND PELVIS WITH CONTRAST  TECHNIQUE: Multidetector CT imaging of the abdomen and pelvis was performed using the standard protocol following bolus administration of intravenous contrast.  CONTRAST:  72mL OMNIPAQUE IOHEXOL 300 MG/ML  SOLN  COMPARISON:  CT of the abdomen and pelvis performed 07/25/2013  FINDINGS: The visualized lung bases are clear. The right-sided breast implant is incompletely visualized but appears grossly unremarkable.  A single 0.9 cm cyst is again noted at the hepatic dome. The liver and spleen are otherwise unremarkable in appearance. The gallbladder is mildly distended, and contains scattered layering stones, without definite evidence of distal obstruction. It is otherwise unremarkable in appearance. The pancreas and adrenal glands are within normal limits.  A large staghorn calculus is again noted at the lower pole of the right kidney, extending to the right renal pelvis. It has increased  mildly in size from the prior study, now measuring approximately 4.2 cm. A 3.5 cm cyst is noted at the upper pole of the left kidney. Scattered smaller bilateral renal cysts are seen. No obstructing ureteral stones are identified.  There is no definite evidence of hydronephrosis.  There is distention of the distal ileum to 5.4 cm in maximal diameter with dense stool, raising concern for significant small bowel dysmotility. There is mild persistent narrowing of the terminal ileum. The cecum and ascending colon are filled with stool. There is mild associated inflammation about the distal ileum, with trace associated free fluid.  The more proximal small bowel is mildly distended, without evidence of a transition point. The stomach is within normal limits. No acute vascular abnormalities are seen.  The appendix is not definitely seen. There is no evidence of appendicitis. The remainder of the colon is partially filled with stool and is unremarkable in appearance.  The bladder is mildly distended and grossly unremarkable. The patient is status posthysterectomy. The left ovary is unremarkable in appearance. No suspicious adnexal masses are seen. No inguinal lymphadenopathy is seen.  No acute osseous abnormalities are identified. Multilevel vacuum phenomenon is noted along the lumbar spine; mild left convex lumbar scoliosis is noted.  IMPRESSION: 1. Distention of the distal ileum to 5.4 cm in maximal diameter, filled with relatively dense stool. This appearance is mildly worsened from the prior study, with mild persistent narrowing of the terminal ileum. However, the cecum and ascending colon are filled with stool. This likely reflects significant distal small bowel dysmotility. No definite evidence of bowel obstruction. 2. Large staghorn calculus again noted at the lower pole the right kidney measuring 4.2 cm in size. This is increased mildly in size. Scattered bilateral renal cysts seen. 3. Small hepatic cyst noted. 4.  Gallbladder mildly distended, with cholelithiasis. No definite evidence of distal obstruction. 5. Mild left convex lumbar scoliosis noted.   Electronically Signed   By: Garald Balding M.D.   On: 09/19/2014 02:54     EKG Interpretation None      MDM   Final diagnoses:  Generalized abdominal pain  Constipation, unspecified constipation type    Nonspecific abdominal pain, with apparent small bowel dysmotility, and increased stool burden in based on CT imaging. There is no fecal impaction.  She has persistent, vomiting, without evidence for bowel obstruction on CT imaging.   Nursing Notes Reviewed/ Care Coordinated, and agree without changes. Applicable Imaging Reviewed.  Interpretation of Laboratory Data incorporated into ED treatment  I personally performed the services described in this documentation, which was scribed in my presence. The recorded information has been reviewed and is accurate.      Richarda Blade, MD 09/19/14 [phone]

## 2014-09-19 ENCOUNTER — Observation Stay (HOSPITAL_COMMUNITY): Payer: Medicare Other

## 2014-09-19 ENCOUNTER — Emergency Department (HOSPITAL_COMMUNITY): Payer: Medicare Other

## 2014-09-19 ENCOUNTER — Encounter (HOSPITAL_COMMUNITY): Payer: Self-pay | Admitting: Internal Medicine

## 2014-09-19 ENCOUNTER — Inpatient Hospital Stay (HOSPITAL_COMMUNITY): Payer: Medicare Other

## 2014-09-19 DIAGNOSIS — Z825 Family history of asthma and other chronic lower respiratory diseases: Secondary | ICD-10-CM | POA: Diagnosis not present

## 2014-09-19 DIAGNOSIS — I129 Hypertensive chronic kidney disease with stage 1 through stage 4 chronic kidney disease, or unspecified chronic kidney disease: Secondary | ICD-10-CM | POA: Diagnosis present

## 2014-09-19 DIAGNOSIS — D649 Anemia, unspecified: Secondary | ICD-10-CM | POA: Diagnosis not present

## 2014-09-19 DIAGNOSIS — I4891 Unspecified atrial fibrillation: Secondary | ICD-10-CM | POA: Diagnosis present

## 2014-09-19 DIAGNOSIS — K599 Functional intestinal disorder, unspecified: Secondary | ICD-10-CM

## 2014-09-19 DIAGNOSIS — K8020 Calculus of gallbladder without cholecystitis without obstruction: Secondary | ICD-10-CM | POA: Diagnosis present

## 2014-09-19 DIAGNOSIS — E039 Hypothyroidism, unspecified: Secondary | ICD-10-CM | POA: Diagnosis not present

## 2014-09-19 DIAGNOSIS — K5900 Constipation, unspecified: Principal | ICD-10-CM

## 2014-09-19 DIAGNOSIS — R109 Unspecified abdominal pain: Secondary | ICD-10-CM | POA: Diagnosis present

## 2014-09-19 DIAGNOSIS — I10 Essential (primary) hypertension: Secondary | ICD-10-CM | POA: Diagnosis not present

## 2014-09-19 DIAGNOSIS — K5669 Other intestinal obstruction: Secondary | ICD-10-CM | POA: Diagnosis present

## 2014-09-19 DIAGNOSIS — R1084 Generalized abdominal pain: Secondary | ICD-10-CM

## 2014-09-19 DIAGNOSIS — Z90710 Acquired absence of both cervix and uterus: Secondary | ICD-10-CM | POA: Diagnosis not present

## 2014-09-19 DIAGNOSIS — K5660 Unspecified intestinal obstruction: Secondary | ICD-10-CM | POA: Diagnosis not present

## 2014-09-19 DIAGNOSIS — Z87442 Personal history of urinary calculi: Secondary | ICD-10-CM | POA: Diagnosis not present

## 2014-09-19 DIAGNOSIS — M1990 Unspecified osteoarthritis, unspecified site: Secondary | ICD-10-CM | POA: Diagnosis present

## 2014-09-19 DIAGNOSIS — K912 Postsurgical malabsorption, not elsewhere classified: Secondary | ICD-10-CM | POA: Diagnosis not present

## 2014-09-19 DIAGNOSIS — Z853 Personal history of malignant neoplasm of breast: Secondary | ICD-10-CM | POA: Diagnosis not present

## 2014-09-19 DIAGNOSIS — K219 Gastro-esophageal reflux disease without esophagitis: Secondary | ICD-10-CM | POA: Diagnosis present

## 2014-09-19 DIAGNOSIS — Z9011 Acquired absence of right breast and nipple: Secondary | ICD-10-CM | POA: Diagnosis present

## 2014-09-19 DIAGNOSIS — N183 Chronic kidney disease, stage 3 (moderate): Secondary | ICD-10-CM

## 2014-09-19 DIAGNOSIS — N201 Calculus of ureter: Secondary | ICD-10-CM | POA: Diagnosis not present

## 2014-09-19 DIAGNOSIS — N200 Calculus of kidney: Secondary | ICD-10-CM | POA: Diagnosis not present

## 2014-09-19 DIAGNOSIS — K7689 Other specified diseases of liver: Secondary | ICD-10-CM | POA: Diagnosis present

## 2014-09-19 DIAGNOSIS — Z87891 Personal history of nicotine dependence: Secondary | ICD-10-CM | POA: Diagnosis not present

## 2014-09-19 LAB — URINALYSIS, ROUTINE W REFLEX MICROSCOPIC
Bilirubin Urine: NEGATIVE
GLUCOSE, UA: NEGATIVE mg/dL
Ketones, ur: NEGATIVE mg/dL
Leukocytes, UA: NEGATIVE
Nitrite: NEGATIVE
PROTEIN: NEGATIVE mg/dL
Specific Gravity, Urine: <1.005 — ABNORMAL LOW (ref 1.005–1.030)
Urobilinogen, UA: 0.2 mg/dL (ref 0.0–1.0)
pH: 6 (ref 5.0–8.0)

## 2014-09-19 LAB — T4, FREE: FREE T4: 1.04 ng/dL (ref 0.80–1.80)

## 2014-09-19 LAB — URINE MICROSCOPIC-ADD ON

## 2014-09-19 LAB — TSH: TSH: 1.77 u[IU]/mL (ref 0.350–4.500)

## 2014-09-19 LAB — LIPASE, BLOOD: Lipase: 45 U/L (ref 11–59)

## 2014-09-19 MED ORDER — ONDANSETRON HCL 4 MG/2ML IJ SOLN: 4.0000 mg | Freq: Four times a day (QID) | INTRAMUSCULAR | Status: DC | PRN

## 2014-09-19 MED ORDER — ACETAMINOPHEN 650 MG RE SUPP: 650.0000 mg | Freq: Four times a day (QID) | RECTAL | Status: DC | PRN

## 2014-09-19 MED ORDER — DULOXETINE HCL 60 MG PO CPEP
60.0000 mg | ORAL_CAPSULE | Freq: Every morning | ORAL | Status: DC
  Administered 2014-09-19 – 2014-09-21 (×3): 60 mg via ORAL
  Filled 2014-09-19 (×3): qty 1

## 2014-09-19 MED ORDER — ONDANSETRON HCL 4 MG PO TABS: 4.0000 mg | ORAL_TABLET | Freq: Four times a day (QID) | ORAL | Status: DC | PRN

## 2014-09-19 MED ORDER — LEVOTHYROXINE SODIUM 50 MCG PO TABS
50.0000 ug | ORAL_TABLET | Freq: Every day | ORAL | Status: DC
  Administered 2014-09-19 – 2014-09-21 (×3): 50 ug via ORAL
  Filled 2014-09-19 (×3): qty 1

## 2014-09-19 MED ORDER — IOHEXOL 300 MG/ML  SOLN: 50.0000 mL | Freq: Once | INTRAMUSCULAR | Status: DC | PRN

## 2014-09-19 MED ORDER — MORPHINE SULFATE 2 MG/ML IJ SOLN: 0.5000 mg | INTRAMUSCULAR | Status: DC | PRN

## 2014-09-19 MED ORDER — PANTOPRAZOLE SODIUM 40 MG PO TBEC
40.0000 mg | DELAYED_RELEASE_TABLET | Freq: Every day | ORAL | Status: DC
  Administered 2014-09-19 – 2014-09-21 (×3): 40 mg via ORAL
  Filled 2014-09-19 (×3): qty 1

## 2014-09-19 MED ORDER — FLEET ENEMA 7-19 GM/118ML RE ENEM
1.0000 | ENEMA | Freq: Once | RECTAL | Status: AC
  Administered 2014-09-19: 1 via RECTAL

## 2014-09-19 MED ORDER — ALPRAZOLAM 1 MG PO TABS
1.0000 mg | ORAL_TABLET | Freq: Every evening | ORAL | Status: DC | PRN
  Administered 2014-09-20 – 2014-09-21 (×2): 1 mg via ORAL
  Filled 2014-09-19 (×2): qty 1

## 2014-09-19 MED ORDER — ACETAMINOPHEN 325 MG PO TABS
650.0000 mg | ORAL_TABLET | Freq: Four times a day (QID) | ORAL | Status: DC | PRN
  Administered 2014-09-19: 650 mg via ORAL
  Filled 2014-09-19: qty 2

## 2014-09-19 MED ORDER — HEPARIN SODIUM (PORCINE) 5000 UNIT/ML IJ SOLN
5000.0000 [IU] | Freq: Three times a day (TID) | INTRAMUSCULAR | Status: DC
  Administered 2014-09-19 – 2014-09-21 (×8): 5000 [IU] via SUBCUTANEOUS
  Filled 2014-09-19 (×8): qty 1

## 2014-09-19 MED ORDER — DOCUSATE SODIUM 100 MG PO CAPS
100.0000 mg | ORAL_CAPSULE | Freq: Two times a day (BID) | ORAL | Status: DC
  Administered 2014-09-19 – 2014-09-21 (×4): 100 mg via ORAL
  Filled 2014-09-19 (×5): qty 1

## 2014-09-19 MED ORDER — FOLIC ACID 1 MG PO TABS
1.0000 mg | ORAL_TABLET | Freq: Every day | ORAL | Status: DC
  Administered 2014-09-19 – 2014-09-21 (×3): 1 mg via ORAL
  Filled 2014-09-19 (×3): qty 1

## 2014-09-19 MED ORDER — ONDANSETRON HCL 4 MG/2ML IJ SOLN
4.0000 mg | Freq: Once | INTRAMUSCULAR | Status: DC
  Filled 2014-09-19: qty 2

## 2014-09-19 MED ORDER — ONDANSETRON HCL 4 MG/2ML IJ SOLN
4.0000 mg | Freq: Once | INTRAMUSCULAR | Status: AC
  Administered 2014-09-19: 4 mg via INTRAVENOUS

## 2014-09-19 MED ORDER — SODIUM CHLORIDE 0.9 % IV SOLN
INTRAVENOUS | Status: AC
  Administered 2014-09-19: 07:00:00 via INTRAVENOUS

## 2014-09-19 MED ORDER — IOHEXOL 300 MG/ML  SOLN
100.0000 mL | Freq: Once | INTRAMUSCULAR | Status: AC | PRN
  Administered 2014-09-19: 80 mL via INTRAVENOUS

## 2014-09-19 MED ORDER — POLYETHYLENE GLYCOL 3350 17 G PO PACK
17.0000 g | PACK | Freq: Two times a day (BID) | ORAL | Status: DC
  Administered 2014-09-19 – 2014-09-20 (×3): 17 g via ORAL
  Filled 2014-09-19 (×2): qty 1

## 2014-09-19 NOTE — Progress Notes (Signed)
Fleets enema given as ordered. Patient has a moderate amount of semisolid stool return. Tolerated well.

## 2014-09-19 NOTE — Progress Notes (Signed)
UR completed 

## 2014-09-19 NOTE — ED Notes (Signed)
Report given to [place] on 300

## 2014-09-19 NOTE — Consult Note (Signed)
Referring Provider: No ref. provider found Primary Care Physician:  Leonides Grills, MD Primary Gastroenterologist:  Dr. Laural Golden  Reason for Consultation:    Small bowel obstruction.  HPI:   Patient is 80 year-old Caucasian female with multiple medical problems including history of small bowel obstruction was in usual state of health until midday yesterday when she developed severe midabdominal cramping. This pain persisted and she finally came to the emergency room last evening for evaluation. She was given morphine for pain control. She had abdominopelvic CT which revealed distal small bowel stricture above which was distended loop of ileum with stool moment more dilated loops with air-fluid levels. Patient was therefore hospitalized and begun on IV fluids. She hasn't had any more pain. She was given clear liquids at breakfast and had no difficulty. She did vomit after she arrived in the emergency room yesterday but not anymore. She has passed some flatness and also had small amount of stool. She denies hematemesis melena or rectal bleeding. She also denies fever or chills. She states she has intermittent episodes of fleeting pain. When this occurs she stops eating and takes Xanax and usually feels better when couple of hours. She was in the emergency room on August 2014 with similar symptomatology. She was treated and discharged from the emergency room. She also has history of chronic diarrhea felt to be secondary to small intestine bacterial overgrowth and perhaps short gut syndrome she has not required antibiotic recently. She was seen in emergency room one week ago for left parotitis and given cephalexin but she stopped after 5 days ago she developed loose stool. Patient's last EGD in colonoscopy was in December 2013 foot on insufficiency requiring transfusion. EGD was normal and colonoscopy revealed a few sigmoid diverticula and patent ileocolonic anastomosis.   Past Medical History   Diagnosis Date  . Hypertension   . Hypothyroid   . Abdominal adhesions 1994  . Atrial fibrillation 10/2012    Associated with severe anemia and esophageal pill impaction  . Gastroesophageal reflux disease     Hiatal hernia  . Anxiety and depression   . Cholelithiasis     asymptomatic  . Upper GI bleed 2004    Multiple episodes of melena-? due to gastritis or adverse drug effect (nonsteroidals, small bowel ulceration with Fosamax); caused by Pepto-Bismol during one Emergency Department evaluation  . Malabsorption     Short gut syndrome following small bowel resection surgery x2  . Breast carcinoma     right mastectomy "25+ years ago"  . Anemia   . Arthritis   . Low back pain   . Allergic rhinitis   . Nephrolithiasis 2004    painless hematuria    Past Surgical History  Procedure Laterality Date  . Abdominal hysterectomy      emergency s/p delivery  . Bowel resection      Resulting short gut syndrome  . Colonoscopy w/ polypectomy  2005    Lipoma; diverticulosis  . Upper gastrointestinal endoscopy    . Mastectomy  right breast  . Colonoscopy with esophagogastroduodenoscopy (egd)  11/22/2012    [facility] Granderson  . Mastectomy      Carcinoma of the breast; right  . Abdominal hysterectomy  1960    massive gynecologic bleeding  . Laparoscopic lysis of adhesions  1965    s/p adhesions    Prior to Admission medications   Medication Sig Start Date End Date Taking? Authorizing Provider  ALPRAZolam Duanne Moron) 1 MG tablet Take 1 mg by mouth at bedtime as  needed for sleep.   Yes Historical Provider, MD  Calcium Carbonate-Vitamin D (CALCIUM + D PO) Take 1 tablet by mouth daily.   Yes Historical Provider, MD  Cyanocobalamin (VITAMIN B-12 IJ) Inject as directed every 30 (thirty) days.   Yes Historical Provider, MD  DULoxetine (CYMBALTA) 60 MG capsule Take 60 mg by mouth every morning.    Yes Historical Provider, MD  folic acid (FOLVITE) 1 MG tablet Take 1 mg by mouth daily.   Yes Historical  Provider, MD  glucosamine-chondroitin 500-400 MG tablet Take 1 tablet by mouth every morning.    Yes Historical Provider, MD  iron polysaccharides (NIFEREX) 150 MG capsule Take 1 capsule (150 mg total) by mouth every Monday, Wednesday, and Friday. 06/27/13  Yes Rogene Houston, MD  Lactobacillus (ACIDOPHILUS) 100 MG CAPS Take 100 mg by mouth every morning.    Yes Historical Provider, MD  levothyroxine (SYNTHROID, LEVOTHROID) 50 MCG tablet Take 50 mcg by mouth every morning.   Yes Historical Provider, MD  Multiple Vitamin (MULTIVITAMIN) tablet Take 1 tablet by mouth every morning.    Yes Historical Provider, MD  Omega-3 Fatty Acids (FISH OIL) 1000 MG CAPS Take 1,000 mg by mouth every morning.    Yes Historical Provider, MD  pantoprazole (PROTONIX) 40 MG tablet Take 40 mg by mouth daily. 10/02/13  Yes Historical Provider, MD  Cephalexin 500 MG tablet Take 1 tablet (500 mg total) by mouth 4 (four) times daily. 09/12/14   Evalee Jefferson, PA-C    Current Facility-Administered Medications  Medication Dose Route Frequency Provider Last Rate Last Dose  . 0.9 %  sodium chloride infusion   Intravenous Continuous Bonnielee Haff, MD 75 mL/hr at 09/19/14 0700    . acetaminophen (TYLENOL) tablet 650 mg  650 mg Oral Q6H PRN Bonnielee Haff, MD       Or  . acetaminophen (TYLENOL) suppository 650 mg  650 mg Rectal Q6H PRN Bonnielee Haff, MD      . ALPRAZolam Duanne Moron) tablet 1 mg  1 mg Oral QHS PRN Bonnielee Haff, MD      . docusate sodium (COLACE) capsule 100 mg  100 mg Oral BID Bonnielee Haff, MD   100 mg at 09/19/14 0901  . DULoxetine (CYMBALTA) DR capsule 60 mg  60 mg Oral q morning - 10a Bonnielee Haff, MD   60 mg at 09/19/14 0901  . folic acid (FOLVITE) tablet 1 mg  1 mg Oral Daily Bonnielee Haff, MD   1 mg at 09/19/14 0901  . heparin injection 5,000 Units  5,000 Units Subcutaneous 3 times per day Bonnielee Haff, MD   5,000 Units at 09/19/14 0901  . levothyroxine (SYNTHROID, LEVOTHROID) tablet 50 mcg  50 mcg Oral  QAC breakfast Bonnielee Haff, MD   50 mcg at 09/19/14 0901  . morphine 2 MG/ML injection 0.5 mg  0.5 mg Intravenous Q4H PRN Bonnielee Haff, MD      . ondansetron (ZOFRAN) tablet 4 mg  4 mg Oral Q6H PRN Bonnielee Haff, MD       Or  . ondansetron (ZOFRAN) injection 4 mg  4 mg Intravenous Q6H PRN Bonnielee Haff, MD      . pantoprazole (PROTONIX) EC tablet 40 mg  40 mg Oral Daily Bonnielee Haff, MD   40 mg at 09/19/14 0901  . polyethylene glycol (MIRALAX / GLYCOLAX) packet 17 g  17 g Oral BID Bonnielee Haff, MD   17 g at 09/19/14 0901  . sodium phosphate (FLEET) 7-19 GM/118ML enema 1 enema  1 enema Rectal Once Bonnielee Haff, MD        Allergies as of 09/18/2014 - Review Complete 09/18/2014  Allergen Reaction Noted  . Codeine Nausea And Vomiting 06/13/2012    Family History  Problem Relation Age of Onset  . Anuerysm Father   . Rheum arthritis Sister   . Healthy Sister   . COPD Sister   . Healthy Brother   . Cancer    . Colon cancer Neg Hx     History   Social History  . Marital Status: Widowed    Spouse Name: N/A    Number of Children: N/A  . Years of Education: 12   Occupational History  . Not on file.   Social History Main Topics  . Smoking status: Former Smoker -- 1.50 packs/day for 20 years    Types: Cigarettes  . Smokeless tobacco: Never Used  . Alcohol Use: No  . Drug Use: No  . Sexual Activity: No   Other Topics Concern  . Not on file   Social History Narrative  . No narrative on file    Review of Systems: See HPI, otherwise normal ROS  Physical Exam: Temp:  [97.5 F (36.4 C)-98 F (36.7 C)] 97.5 F (36.4 C) (10/06 [id number]) Pulse Rate:  [83-88] 83 (10/06 0638) Resp:  [16-20] 20 (10/06 [id number]) BP: (124-139)/(64-84) 124/64 mmHg (10/06 0638) SpO2:  [97 %-98 %] 98 % (10/06 [id number]) Weight:  [120 lb (54.432 kg)-125 lb 6.4 oz (56.881 kg)] 125 lb 6.4 oz (56.881 kg) (10/06 [id number]) Last BM Date: 09/18/14 Patient is alert and in no acute distress. Conjunctiva is  pink. Sclerae nonicteric. Oropharyngeal mucosa is normal. No neck masses or thyromegaly noted. Cardiac exam regular rhythm normal S1 and S2. Grade 2/6 systolic ejection murmur best heard at aortic area. Lungs are clear to auscultation. Abdomen is full but extensive midline scarring. Bowel sounds are normal. On palpation abdomen is soft with mild tenderness in mid abdomen without guarding or rebound. No organomegaly or masses. No clubbing or edema noted.     Lab Results:  Recent Labs  09/18/14 2029  WBC 7.1  HGB 12.6  HCT 37.9  PLT 211   BMET  Recent Labs  09/18/14 2029  NA 143  K 4.9  CL 105  CO2 24  GLUCOSE 94  BUN 19  CREATININE 1.24*  CALCIUM 10.2   LFT  Recent Labs  09/18/14 2029  PROT 7.5  ALBUMIN 4.1  AST 17  ALT 12  ALKPHOS 67  BILITOT 0.3   PT/INR No results found for this basename: LABPROT, INR,  in the last 72 hours Hepatitis Panel No results found for this basename: HEPBSAG, HCVAB, HEPAIGM, HEPBIGM,  in the last 72 hours  Studies/Results: Ct Abdomen Pelvis W Contrast  09/19/2014   CLINICAL DATA:  Acute onset of mid abdominal pain for 12 hours. Personal history of small bowel obstruction due to adhesions. Initial encounter.  EXAM: CT ABDOMEN AND PELVIS WITH CONTRAST  TECHNIQUE: Multidetector CT imaging of the abdomen and pelvis was performed using the standard protocol following bolus administration of intravenous contrast.  CONTRAST:  47mL OMNIPAQUE IOHEXOL 300 MG/ML  SOLN  COMPARISON:  CT of the abdomen and pelvis performed 07/25/2013  FINDINGS: The visualized lung bases are clear. The right-sided breast implant is incompletely visualized but appears grossly unremarkable.  A single 0.9 cm cyst is again noted at the hepatic dome. The liver and spleen are otherwise unremarkable in appearance. The gallbladder is mildly distended,  and contains scattered layering stones, without definite evidence of distal obstruction. It is otherwise unremarkable in  appearance. The pancreas and adrenal glands are within normal limits.  A large staghorn calculus is again noted at the lower pole of the right kidney, extending to the right renal pelvis. It has increased mildly in size from the prior study, now measuring approximately 4.2 cm. A 3.5 cm cyst is noted at the upper pole of the left kidney. Scattered smaller bilateral renal cysts are seen. No obstructing ureteral stones are identified. There is no definite evidence of hydronephrosis.  There is distention of the distal ileum to 5.4 cm in maximal diameter with dense stool, raising concern for significant small bowel dysmotility. There is mild persistent narrowing of the terminal ileum. The cecum and ascending colon are filled with stool. There is mild associated inflammation about the distal ileum, with trace associated free fluid.  The more proximal small bowel is mildly distended, without evidence of a transition point. The stomach is within normal limits. No acute vascular abnormalities are seen.  The appendix is not definitely seen. There is no evidence of appendicitis. The remainder of the colon is partially filled with stool and is unremarkable in appearance.  The bladder is mildly distended and grossly unremarkable. The patient is status posthysterectomy. The left ovary is unremarkable in appearance. No suspicious adnexal masses are seen. No inguinal lymphadenopathy is seen.  No acute osseous abnormalities are identified. Multilevel vacuum phenomenon is noted along the lumbar spine; mild left convex lumbar scoliosis is noted.  IMPRESSION: 1. Distention of the distal ileum to 5.4 cm in maximal diameter, filled with relatively dense stool. This appearance is mildly worsened from the prior study, with mild persistent narrowing of the terminal ileum. However, the cecum and ascending colon are filled with stool. This likely reflects significant distal small bowel dysmotility. No definite evidence of bowel obstruction.  2. Large staghorn calculus again noted at the lower pole the right kidney measuring 4.2 cm in size. This is increased mildly in size. Scattered bilateral renal cysts seen. 3. Small hepatic cyst noted. 4. Gallbladder mildly distended, with cholelithiasis. No definite evidence of distal obstruction. 5. Mild left convex lumbar scoliosis noted.   Electronically Signed   By: Garald Balding M.D.   On: 09/19/2014 02:54   I have reviewed current and prior study from August 2014 with Dr. Lavonia Dana.  Assessment;  Patient's acute symptoms appear to be secondary to small bowel obstruction which appears to have resolved spontaneously. She has distal small bowel stricture proximal to the anastomosis and dilated small bowel upstream. This dilation is possibly chronic. Last abdominopelvic CT was in August,  2014 revealing more or less similar findings she was having acute symptoms then except she has more small bowel dilation now. She has large amount of fecal matter in colon consistent with colonic dysmotility.    Recommendations; Would advance diet later today if she tolerates clear liquids. Agree with the fleets enema this morning. Acute abdominal series in the a.m.   LOS: 1 day   Maston Wight U  09/19/2014, 10:25 AM

## 2014-09-19 NOTE — Progress Notes (Signed)
[facility] PROGRESS NOTE  Peggi Chirillo. Jerl Mina [id number] DOB: [birth year] DOA: 09/18/2014 PCP: Leonides Grills, MD  admission H&P from early this am reviewed.   Assessment/Plan: abdominal pain  CT scan without significant findings except large stool buren. Has narrowing of  terminal ilium. D/w Dr. Laural Golden. Findings do not seem changed from previous CT.  placed on bid miralax and ordered fleet enema. Will monitor. Lipase normal -on clear liquids. Advance diet in am.  large staghorn calculus  on CT Seen previously. No s/s of  obstruction. Follow as outpt   small hepatic cyst on CT  outpatient followup.   Chronic cholelithiasis Asymptomatic with normal LFTs.. unchanged from previous CT.   hypothyroidism:  continue syntnroid  CKD stage 3  stable at baseline. Monitor with gentle hydration  Left parotitis  recently stopped abx. doesnot appear inflamed clinically.   Diet: clear liquids  DVT prophylaxis   Code Status: full code Family Communication: none at bedside Disposition Plan: Home possibly on 107, if symptoms resolve   Consultants:  GI ( Dr Laural Golden)  Procedures:  CT abdomen  Antibiotics:  none  HPI/Subjective: Reports abd pain better this am. Denies N/V.   Objective: Filed Vitals:   09/19/14 0638  BP: 124/64  Pulse: 83  Temp: 97.5 F (36.4 C)  Resp: 20   No intake or output data in the 24 hours ending 09/19/14 1431 Filed Weights   09/18/14 2020 09/19/14 0638  Weight: 54.432 kg (120 lb) 56.881 kg (125 lb 6.4 oz)    Exam:   General:  NAD  HEENT: no pallor, moist mucosa  Cardiovascular: NS1&S2, no murmurs  Respiratory: clear b/l, no added sounds  Abdomen: soft, ND, surgical scar over abdomen from multiple surgery, mild tenderness over periumbilical area, BS+  Musculoskeletal: warm, no edema   Data Reviewed: Basic Metabolic Panel:  Recent Labs Lab 09/18/14 2029  NA 143  K 4.9  CL 105  CO2 24  GLUCOSE 94  BUN 19   CREATININE 1.24*  CALCIUM 10.2   Liver Function Tests:  Recent Labs Lab 09/18/14 2029  AST 17  ALT 12  ALKPHOS 67  BILITOT 0.3  PROT 7.5  ALBUMIN 4.1    Recent Labs Lab 09/19/14 0530  LIPASE 45   No results found for this basename: AMMONIA,  in the last 168 hours CBC:  Recent Labs Lab 09/18/14 2029  WBC 7.1  NEUTROABS 4.3  HGB 12.6  HCT 37.9  MCV 88.3  PLT 211   Cardiac Enzymes: No results found for this basename: CKTOTAL, CKMB, CKMBINDEX, TROPONINI,  in the last 168 hours BNP (last 3 results) No results found for this basename: PROBNP,  in the last 8760 hours CBG: No results found for this basename: GLUCAP,  in the last 168 hours  No results found for this or any previous visit (from the past 240 hour(s)).   Studies: Ct Abdomen Pelvis W Contrast  09/19/2014   CLINICAL DATA:  Acute onset of mid abdominal pain for 12 hours. Personal history of small bowel obstruction due to adhesions. Initial encounter.  EXAM: CT ABDOMEN AND PELVIS WITH CONTRAST  TECHNIQUE: Multidetector CT imaging of the abdomen and pelvis was performed using the standard protocol following bolus administration of intravenous contrast.  CONTRAST:  71mL OMNIPAQUE IOHEXOL 300 MG/ML  SOLN  COMPARISON:  CT of the abdomen and pelvis performed 07/25/2013  FINDINGS: The visualized lung bases are clear. The right-sided breast implant is incompletely visualized but appears grossly unremarkable.  A single 0.9  cm cyst is again noted at the hepatic dome. The liver and spleen are otherwise unremarkable in appearance. The gallbladder is mildly distended, and contains scattered layering stones, without definite evidence of distal obstruction. It is otherwise unremarkable in appearance. The pancreas and adrenal glands are within normal limits.  A large staghorn calculus is again noted at the lower pole of the right kidney, extending to the right renal pelvis. It has increased mildly in size from the prior study, now  measuring approximately 4.2 cm. A 3.5 cm cyst is noted at the upper pole of the left kidney. Scattered smaller bilateral renal cysts are seen. No obstructing ureteral stones are identified. There is no definite evidence of hydronephrosis.  There is distention of the distal ileum to 5.4 cm in maximal diameter with dense stool, raising concern for significant small bowel dysmotility. There is mild persistent narrowing of the terminal ileum. The cecum and ascending colon are filled with stool. There is mild associated inflammation about the distal ileum, with trace associated free fluid.  The more proximal small bowel is mildly distended, without evidence of a transition point. The stomach is within normal limits. No acute vascular abnormalities are seen.  The appendix is not definitely seen. There is no evidence of appendicitis. The remainder of the colon is partially filled with stool and is unremarkable in appearance.  The bladder is mildly distended and grossly unremarkable. The patient is status posthysterectomy. The left ovary is unremarkable in appearance. No suspicious adnexal masses are seen. No inguinal lymphadenopathy is seen.  No acute osseous abnormalities are identified. Multilevel vacuum phenomenon is noted along the lumbar spine; mild left convex lumbar scoliosis is noted.  IMPRESSION: 1. Distention of the distal ileum to 5.4 cm in maximal diameter, filled with relatively dense stool. This appearance is mildly worsened from the prior study, with mild persistent narrowing of the terminal ileum. However, the cecum and ascending colon are filled with stool. This likely reflects significant distal small bowel dysmotility. No definite evidence of bowel obstruction. 2. Large staghorn calculus again noted at the lower pole the right kidney measuring 4.2 cm in size. This is increased mildly in size. Scattered bilateral renal cysts seen. 3. Small hepatic cyst noted. 4. Gallbladder mildly distended, with  cholelithiasis. No definite evidence of distal obstruction. 5. Mild left convex lumbar scoliosis noted.   Electronically Signed   By: Garald Balding M.D.   On: 09/19/2014 02:54    Scheduled Meds: . docusate sodium  100 mg Oral BID  . DULoxetine  60 mg Oral q morning - [id number]  . folic acid  1 mg Oral Daily  . heparin  5,000 Units Subcutaneous 3 times per day  . levothyroxine  50 mcg Oral QAC breakfast  . pantoprazole  40 mg Oral Daily  . polyethylene glycol  17 g Oral BID   Continuous Infusions: . sodium chloride 75 mL/hr at 09/19/14 0700      Time spent: 20 minutes    Warwick Nick, [place]  [facility] Pager [phone] If 7PM-7AM, please contact night-coverage at [email or web address], password [facility] 09/19/2014, 2:31 PM  LOS: 1 day

## 2014-09-19 NOTE — Progress Notes (Signed)
Patient ordered for fleets enema. Discussed with Dr. Laural Golden. Hold for 2 hours per physician

## 2014-09-19 NOTE — ED Notes (Signed)
Patient states that she has been here almost 9 hours. Stated that she told EDP that she is 80 years old and lives alone and that she needed to be admitted to hospital.

## 2014-09-19 NOTE — H&P (Signed)
[facility] History and Physical  Jane Andrews NAT:[id number] DOB: June 07, 1925 DOA: 09/18/2014   PCP: Leonides Grills, MD  Specialists: Dr. Laural Golden, is her gastroenterologist  Chief Complaint: Abdominal pain  HPI: Jane Andrews is a 80 y.o. female with a past medical history of short gut syndrome, previous histories of small bowel obstruction due to adhesions, hypothyroidism, depression, who was in her usual state of health till yesterday afternoon when she started developing abdominal pain. It got worse over the course of the day and so she decided to come in to the hospital. She tells me that last week she was diagnosed with left parotitis and was given a course of antibiotics. Over the weekend she developed diarrhea and so she stopped taking the antibiotics. Now her parotitis feels much better. Yesterday her abdominal pain was located in the upper abdomen. It got worse up to 10 out of 10 in intensity. It felt like gas kind of pain. She was nauseated, but did not have any vomiting at home. However, in the emergency department she vomited twice. Denies any bloody emesis. She had a normal bowel movement yesterday. No fever. Some chills. Denies any medication changes recently. She was concerned that she had a small bowel obstruction and that is the main reason she came in to the hospital.  Home Medications: Prior to Admission medications   Medication Sig Start Date End Date Taking? Authorizing Provider  ALPRAZolam Duanne Moron) 1 MG tablet Take 1 mg by mouth at bedtime as needed for sleep.    Historical Provider, MD  Calcium Carbonate-Vitamin D (CALCIUM + D PO) Take 1 tablet by mouth daily.    Historical Provider, MD  Cephalexin 500 MG tablet Take 1 tablet (500 mg total) by mouth 4 (four) times daily. 09/12/14   Evalee Jefferson, PA-C  Cholecalciferol (VITAMIN D PO) Take 1,200 mg by mouth daily. Takes calcium and vitamin d    Historical Provider, MD  Cyanocobalamin (VITAMIN B-12 IJ) Inject  as directed every 30 (thirty) days.    Historical Provider, MD  DULoxetine (CYMBALTA) 60 MG capsule Take 60 mg by mouth every morning.     Historical Provider, MD  folic acid (FOLVITE) 1 MG tablet Take 1 mg by mouth daily.    Historical Provider, MD  glucosamine-chondroitin 500-400 MG tablet Take 1 tablet by mouth every morning.     Historical Provider, MD  iron polysaccharides (NIFEREX) 150 MG capsule Take 1 capsule (150 mg total) by mouth every Monday, Wednesday, and Friday. 06/27/13   Rogene Houston, MD  Lactobacillus (ACIDOPHILUS) 100 MG CAPS Take 100 mg by mouth every morning.     Historical Provider, MD  levothyroxine (SYNTHROID, LEVOTHROID) 50 MCG tablet Take 50 mcg by mouth every morning.    Historical Provider, MD  Multiple Vitamin (MULTIVITAMIN) tablet Take 1 tablet by mouth every morning.     Historical Provider, MD  Omega-3 Fatty Acids (FISH OIL) 1000 MG CAPS Take 1,000 mg by mouth every morning.     Historical Provider, MD  pantoprazole (PROTONIX) 40 MG tablet Take 40 mg by mouth daily. 10/02/13   Historical Provider, MD    Allergies:  Allergies  Allergen Reactions  . Codeine Nausea And Vomiting    Past Medical History: Past Medical History  Diagnosis Date  . Hypertension   . Hypothyroid   . Abdominal adhesions 1994  . Atrial fibrillation 10/2012    Associated with severe anemia and esophageal pill impaction  . Gastroesophageal reflux disease  Hiatal hernia  . Anxiety and depression   . Cholelithiasis     asymptomatic  . Upper GI bleed 2004    Multiple episodes of melena-? due to gastritis or adverse drug effect (nonsteroidals, small bowel ulceration with Fosamax); caused by Pepto-Bismol during one Emergency Department evaluation  . Malabsorption     Short gut syndrome following small bowel resection surgery x2  . Breast carcinoma     right mastectomy "25+ years ago"  . Anemia   . Arthritis   . Low back pain   . Allergic rhinitis   . Nephrolithiasis 2004     painless hematuria    Past Surgical History  Procedure Laterality Date  . Abdominal hysterectomy      emergency s/p delivery  . Bowel resection      Resulting short gut syndrome  . Colonoscopy w/ polypectomy  2005    Lipoma; diverticulosis  . Upper gastrointestinal endoscopy    . Mastectomy  right breast  . Colonoscopy with esophagogastroduodenoscopy (egd)  11/22/2012    [facility]  . Mastectomy      Carcinoma of the breast; right  . Abdominal hysterectomy  1960    massive gynecologic bleeding  . Laparoscopic lysis of adhesions  1965    s/p adhesions    Social History: She lives in [place] by herself. Denies smoking, alcohol use or illicit drug use. Usually independent with daily activities.  Family History:  Family History  Problem Relation Age of Onset  . Anuerysm Father   . Rheum arthritis Sister   . Healthy Sister   . COPD Sister   . Healthy Brother   . Cancer    . Colon cancer Neg Hx      Review of Systems - History obtained from the patient General ROS: positive for  - fatigue Psychological ROS: negative Ophthalmic ROS: negative ENT ROS: negative Allergy and Immunology ROS: negative Hematological and Lymphatic ROS: negative Endocrine ROS: negative Respiratory ROS: no cough, shortness of breath, or wheezing Cardiovascular ROS: no chest pain or dyspnea on exertion Gastrointestinal ROS: as in hpi Genito-Urinary ROS: no dysuria, trouble voiding, or hematuria Musculoskeletal ROS: negative Neurological ROS: no TIA or stroke symptoms Dermatological ROS: negative  Physical Examination  Filed Vitals:   09/18/14 2020 09/19/14 0436  BP: 134/84 139/67  Pulse: 83 88  Temp: 98 F (36.7 C)   TempSrc: Oral   Resp: 16 18  Height: 5' 4" (1.626 m)   Weight: 54.432 kg (120 lb)   SpO2: 98% 97%    BP 139/67  Pulse 88  Temp(Src) 98 F (36.7 C) (Oral)  Resp 18  Ht 5' 4" (1.626 m)  Wt 54.432 kg (120 lb)  BMI 20.59 kg/m2  SpO2 97%  General appearance: alert,  cooperative, appears stated age and no distress Head: Normocephalic, without obvious abnormality, atraumatic. No abnormality over left parotid.  Eyes: conjunctivae/corneas clear. PERRL, EOM's intact.  Throat: lips, mucosa, and tongue normal. She has a bump over her left lower gum. Nontender. She was asked to followup with the dentist for the same. Resp: clear to auscultation bilaterally Cardio: regular rate and rhythm, S1, S2 normal, no murmur, click, rub or gallop GI: Abdomen is soft. There is tenderness in the upper abdomen, and in the central part without any rebound, rigidity, or guarding. No masses, or organomegaly. Bowel sounds present. Extremities: extremities normal, atraumatic, no cyanosis or edema Pulses: 2+ and symmetric Skin: Skin color, texture, turgor normal. No rashes or lesions Lymph nodes: Cervical, supraclavicular,  and axillary nodes normal. Neurologic: Alert and oriented x3. No focal neurological deficits are present.  Laboratory Data: Results for orders placed during the hospital encounter of 09/18/14 (from the past 48 hour(s))  CBC WITH DIFFERENTIAL     Status: Abnormal   Collection Time    09/18/14  8:29 PM      Result Value Ref Range   WBC 7.1  4.0 - 10.5 K/uL   RBC 4.29  3.87 - 5.11 MIL/uL   Hemoglobin 12.6  12.0 - 15.0 g/dL   HCT 37.9  36.0 - 46.0 %   MCV 88.3  78.0 - 100.0 fL   MCH 29.4  26.0 - 34.0 pg   MCHC 33.2  30.0 - 36.0 g/dL   RDW 13.0  11.5 - 15.5 %   Platelets 211  150 - 400 K/uL   Neutrophils Relative % 60  43 - 77 %   Neutro Abs 4.3  1.7 - 7.7 K/uL   Lymphocytes Relative 21  12 - 46 %   Lymphs Abs 1.5  0.7 - 4.0 K/uL   Monocytes Relative 12  3 - 12 %   Monocytes Absolute 0.8  0.1 - 1.0 K/uL   Eosinophils Relative 7 (*) 0 - 5 %   Eosinophils Absolute 0.5  0.0 - 0.7 K/uL   Basophils Relative 0  0 - 1 %   Basophils Absolute 0.0  0.0 - 0.1 K/uL  COMPREHENSIVE METABOLIC PANEL     Status: Abnormal   Collection Time    09/18/14  8:29 PM       Result Value Ref Range   Sodium 143  137 - 147 mEq/L   Potassium 4.9  3.7 - 5.3 mEq/L   Chloride 105  96 - 112 mEq/L   CO2 24  19 - 32 mEq/L   Glucose, Bld 94  70 - 99 mg/dL   BUN 19  6 - 23 mg/dL   Creatinine, Ser 1.24 (*) 0.50 - 1.10 mg/dL   Calcium 10.2  8.4 - 10.5 mg/dL   Total Protein 7.5  6.0 - 8.3 g/dL   Albumin 4.1  3.5 - 5.2 g/dL   AST 17  0 - 37 U/L   ALT 12  0 - 35 U/L   Alkaline Phosphatase 67  39 - 117 U/L   Total Bilirubin 0.3  0.3 - 1.2 mg/dL   GFR calc non Af Amer 38 (*) >90 mL/min   GFR calc Af Amer 44 (*) >90 mL/min   Comment: (NOTE)     The eGFR has been calculated using the CKD EPI equation.     This calculation has not been validated in all clinical situations.     eGFR's persistently <90 mL/min signify possible Chronic Kidney     Disease.   Anion gap 14  5 - 15  URINALYSIS, ROUTINE W REFLEX MICROSCOPIC     Status: Abnormal   Collection Time    09/19/14  1:39 AM      Result Value Ref Range   Color, Urine YELLOW  YELLOW   APPearance CLEAR  CLEAR   Specific Gravity, Urine <1.005 (*) 1.005 - 1.030   pH 6.0  5.0 - 8.0   Glucose, UA NEGATIVE  NEGATIVE mg/dL   Hgb urine dipstick LARGE (*) NEGATIVE   Bilirubin Urine NEGATIVE  NEGATIVE   Ketones, ur NEGATIVE  NEGATIVE mg/dL   Protein, ur NEGATIVE  NEGATIVE mg/dL   Urobilinogen, UA 0.2  0.0 - 1.0 mg/dL  Nitrite NEGATIVE  NEGATIVE   Leukocytes, UA NEGATIVE  NEGATIVE  URINE MICROSCOPIC-ADD ON     Status: None   Collection Time    09/19/14  1:39 AM      Result Value Ref Range   Squamous Epithelial / LPF RARE  RARE   WBC, UA 0-2  <3 WBC/hpf   RBC / HPF 11-20  <3 RBC/hpf   Bacteria, UA RARE  RARE    Radiology Reports: Ct Abdomen Pelvis W Contrast  09/19/2014   CLINICAL DATA:  Acute onset of mid abdominal pain for 12 hours. Personal history of small bowel obstruction due to adhesions. Initial encounter.  EXAM: CT ABDOMEN AND PELVIS WITH CONTRAST  TECHNIQUE: Multidetector CT imaging of the abdomen and pelvis  was performed using the standard protocol following bolus administration of intravenous contrast.  CONTRAST:  38m OMNIPAQUE IOHEXOL 300 MG/ML  SOLN  COMPARISON:  CT of the abdomen and pelvis performed 07/25/2013  FINDINGS: The visualized lung bases are clear. The right-sided breast implant is incompletely visualized but appears grossly unremarkable.  A single 0.9 cm cyst is again noted at the hepatic dome. The liver and spleen are otherwise unremarkable in appearance. The gallbladder is mildly distended, and contains scattered layering stones, without definite evidence of distal obstruction. It is otherwise unremarkable in appearance. The pancreas and adrenal glands are within normal limits.  A large staghorn calculus is again noted at the lower pole of the right kidney, extending to the right renal pelvis. It has increased mildly in size from the prior study, now measuring approximately 4.2 cm. A 3.5 cm cyst is noted at the upper pole of the left kidney. Scattered smaller bilateral renal cysts are seen. No obstructing ureteral stones are identified. There is no definite evidence of hydronephrosis.  There is distention of the distal ileum to 5.4 cm in maximal diameter with dense stool, raising concern for significant small bowel dysmotility. There is mild persistent narrowing of the terminal ileum. The cecum and ascending colon are filled with stool. There is mild associated inflammation about the distal ileum, with trace associated free fluid.  The more proximal small bowel is mildly distended, without evidence of a transition point. The stomach is within normal limits. No acute vascular abnormalities are seen.  The appendix is not definitely seen. There is no evidence of appendicitis. The remainder of the colon is partially filled with stool and is unremarkable in appearance.  The bladder is mildly distended and grossly unremarkable. The patient is status posthysterectomy. The left ovary is unremarkable in  appearance. No suspicious adnexal masses are seen. No inguinal lymphadenopathy is seen.  No acute osseous abnormalities are identified. Multilevel vacuum phenomenon is noted along the lumbar spine; mild left convex lumbar scoliosis is noted.  IMPRESSION: 1. Distention of the distal ileum to 5.4 cm in maximal diameter, filled with relatively dense stool. This appearance is mildly worsened from the prior study, with mild persistent narrowing of the terminal ileum. However, the cecum and ascending colon are filled with stool. This likely reflects significant distal small bowel dysmotility. No definite evidence of bowel obstruction. 2. Large staghorn calculus again noted at the lower pole the right kidney measuring 4.2 cm in size. This is increased mildly in size. Scattered bilateral renal cysts seen. 3. Small hepatic cyst noted. 4. Gallbladder mildly distended, with cholelithiasis. No definite evidence of distal obstruction. 5. Mild left convex lumbar scoliosis noted.   Electronically Signed   By: JGarald BaldingM.D.   On:  09/19/2014 02:54    Problem List  Principal Problem:   Abdominal pain Active Problems:   CKD (chronic kidney disease) stage 3, GFR 30-59 ml/min   Hypothyroid   Atrial fibrillation   Small bowel motility disorder   Assessment: This is 80 year old, Caucasian female, who presents with abdominal pain. She is found to have distention of her distal ileum with narrowing of terminal ileum and with findings of constipation. Dysmotility is thought to be present. There is no small bowel obstruction that is noted. Other incidental findings are appreciated.  Plan: #1 abdominal pain with abnormal CT scan/constipation: Reason for her presentation is not entirely clear. She had colonoscopy last in December of 2013 and at that time the ileocolonic anastomosis was wide-open. Few diverticula were seen. There is some narrowing of the terminal ileum seen on today's CT scan. We will give her laxatives and  stool softeners. We will also consult gastroenterology. Symptomatic treatment for now. Keep her on clear liquids. Check a lipase  #2 large staghorn calculus noted on CT: This has been seen before. PCP to address as outpatient.  #3 small hepatic cyst on CT: Needs outpatient followup.  #4 abnormal gallbladder on CT: She does not have any right upper quadrant tenderness. LFTs are normal.   #5 history of hypothyroidism: Check thyroid function test. Continue with l-thyroxine.  #6 history of chronic kidney disease, stage III colon, stable. Continue to monitor renal function.  #7 recent left parotitis: She is feeling better. She stopped taking antibiotics due to diarrhea. Denies any diarrhea currently. Continue to monitor   DVT Prophylaxis: Heparin Code Status: Full code Family Communication: Discussed the patient and her brother  Disposition Plan: Observe to MedSurg   Further management decisions will depend on results of further testing and patient's response to treatment.   [facility]  [facility] Pager [phone]  If 7PM-7AM, please contact night-coverage [email or web address] Password [email or web address]  09/19/2014, 5:13 AM

## 2014-09-19 NOTE — Progress Notes (Signed)
Patient refusing enema at this time. Discussed with Dr. Clementeen Graham. He will speak with patient.

## 2014-09-20 ENCOUNTER — Inpatient Hospital Stay (HOSPITAL_COMMUNITY): Payer: Medicare Other

## 2014-09-20 DIAGNOSIS — D649 Anemia, unspecified: Secondary | ICD-10-CM

## 2014-09-20 LAB — CBC
HCT: 32.6 % — ABNORMAL LOW (ref 36.0–46.0)
Hemoglobin: 10.6 g/dL — ABNORMAL LOW (ref 12.0–15.0)
MCH: 28.8 pg (ref 26.0–34.0)
MCHC: 32.5 g/dL (ref 30.0–36.0)
MCV: 88.6 fL (ref 78.0–100.0)
PLATELETS: 167 10*3/uL (ref 150–400)
RBC: 3.68 MIL/uL — AB (ref 3.87–5.11)
RDW: 13 % (ref 11.5–15.5)
WBC: 4.2 10*3/uL (ref 4.0–10.5)

## 2014-09-20 LAB — COMPREHENSIVE METABOLIC PANEL
ALT: 8 U/L (ref 0–35)
ANION GAP: 10 (ref 5–15)
AST: 13 U/L (ref 0–37)
Albumin: 3.1 g/dL — ABNORMAL LOW (ref 3.5–5.2)
Alkaline Phosphatase: 51 U/L (ref 39–117)
BILIRUBIN TOTAL: 0.3 mg/dL (ref 0.3–1.2)
BUN: 11 mg/dL (ref 6–23)
CALCIUM: 8.7 mg/dL (ref 8.4–10.5)
CHLORIDE: 108 meq/L (ref 96–112)
CO2: 27 meq/L (ref 19–32)
CREATININE: 1.11 mg/dL — AB (ref 0.50–1.10)
GFR, EST AFRICAN AMERICAN: 51 mL/min — AB (ref >90)
GFR, EST NON AFRICAN AMERICAN: 44 mL/min — AB (ref >90)
GLUCOSE: 91 mg/dL (ref 70–99)
Potassium: 4.1 mEq/L (ref 3.7–5.3)
Sodium: 145 mEq/L (ref 137–147)
Total Protein: 5.8 g/dL — ABNORMAL LOW (ref 6.0–8.3)

## 2014-09-20 NOTE — Progress Notes (Signed)
Patient states she had large liquid bowel movement; she did not make it to the bathroom. Acute abdominal series was done today instead of tomorrow. Most of the gas is in large bowel. Will discontinue polyethylene glycol. It can be resumed at lower dose when she goes home.

## 2014-09-20 NOTE — Progress Notes (Signed)
[facility] PROGRESS NOTE  Jane Andrews. Jerl Mina VG:[id number] DOB: 09-Nov-1925 DOA: 09/18/2014 PCP: Leonides Grills, MD  Assessment/Plan: abdominal pain  CT scan without significant findings except large stool buren. Has narrowing of  terminal ilium. D/w Dr. Laural Golden. Findings do not seem changed from previous CT.  She is on aggressive bowel regimen. Diet is being advanced.  If she continues to improve, anticipate discharge home in next 24 hours  large staghorn calculus  on CT Seen previously. No s/s of  obstruction. Follow as outpt   small hepatic cyst on CT  outpatient followup.   Chronic cholelithiasis Asymptomatic with normal LFTs.. unchanged from previous CT.   hypothyroidism:  continue syntnroid  CKD stage 3  stable at baseline. Monitor with gentle hydration  Left parotitis  recently stopped abx. doesnot appear inflamed clinically.   Diet: advancing to solid food  DVT prophylaxis   Code Status: full code Family Communication: none at bedside Disposition Plan: Home possibly on 10/8, if symptoms resolve   Consultants:  GI ( Dr Laural Golden)  Procedures:  CT abdomen  Antibiotics:  none  HPI/Subjective: Having multiple loose stools, no vomiting.  Objective: Filed Vitals:   09/20/14 1319  BP: 122/53  Pulse: 80  Temp: 98.6 F (37 C)  Resp: 20    Intake/Output Summary (Last 24 hours) at 09/20/14 1931 Last data filed at 09/20/14 1731  Gross per 24 hour  Intake    800 ml  Output      3 ml  Net    797 ml   Filed Weights   09/18/14 2020 09/19/14 0638 09/20/14 0515  Weight: 54.432 kg (120 lb) 56.881 kg (125 lb 6.4 oz) 57.516 kg (126 lb 12.8 oz)    Exam:   General:  NAD  HEENT: no pallor, moist mucosa  Cardiovascular: NS1&S2, no murmurs  Respiratory: clear b/l, no added sounds  Abdomen: soft, ND, surgical scar over abdomen from multiple surgery, mild tenderness over periumbilical area, BS+  Musculoskeletal: warm, no edema   Data  Reviewed: Basic Metabolic Panel:  Recent Labs Lab 09/18/14 2029 09/20/14 0625  NA 143 145  K 4.9 4.1  CL 105 108  CO2 24 27  GLUCOSE 94 91  BUN 19 11  CREATININE 1.24* 1.11*  CALCIUM 10.2 8.7   Liver Function Tests:  Recent Labs Lab 09/18/14 2029 09/20/14 0625  AST 17 13  ALT 12 8  ALKPHOS 67 51  BILITOT 0.3 0.3  PROT 7.5 5.8*  ALBUMIN 4.1 3.1*    Recent Labs Lab 09/19/14 0530  LIPASE 45   No results found for this basename: AMMONIA,  in the last 168 hours CBC:  Recent Labs Lab 09/18/14 2029 09/20/14 0625  WBC 7.1 4.2  NEUTROABS 4.3  --   HGB 12.6 10.6*  HCT 37.9 32.6*  MCV 88.3 88.6  PLT 211 167   Cardiac Enzymes: No results found for this basename: CKTOTAL, CKMB, CKMBINDEX, TROPONINI,  in the last 168 hours BNP (last 3 results) No results found for this basename: PROBNP,  in the last 8760 hours CBG: No results found for this basename: GLUCAP,  in the last 168 hours  No results found for this or any previous visit (from the past 240 hour(s)).   Studies: Ct Abdomen Pelvis W Contrast  09/19/2014   CLINICAL DATA:  Acute onset of mid abdominal pain for 12 hours. Personal history of small bowel obstruction due to adhesions. Initial encounter.  EXAM: CT ABDOMEN AND PELVIS WITH CONTRAST  TECHNIQUE: Multidetector  CT imaging of the abdomen and pelvis was performed using the standard protocol following bolus administration of intravenous contrast.  CONTRAST:  41mL OMNIPAQUE IOHEXOL 300 MG/ML  SOLN  COMPARISON:  CT of the abdomen and pelvis performed 07/25/2013  FINDINGS: The visualized lung bases are clear. The right-sided breast implant is incompletely visualized but appears grossly unremarkable.  A single 0.9 cm cyst is again noted at the hepatic dome. The liver and spleen are otherwise unremarkable in appearance. The gallbladder is mildly distended, and contains scattered layering stones, without definite evidence of distal obstruction. It is otherwise  unremarkable in appearance. The pancreas and adrenal glands are within normal limits.  A large staghorn calculus is again noted at the lower pole of the right kidney, extending to the right renal pelvis. It has increased mildly in size from the prior study, now measuring approximately 4.2 cm. A 3.5 cm cyst is noted at the upper pole of the left kidney. Scattered smaller bilateral renal cysts are seen. No obstructing ureteral stones are identified. There is no definite evidence of hydronephrosis.  There is distention of the distal ileum to 5.4 cm in maximal diameter with dense stool, raising concern for significant small bowel dysmotility. There is mild persistent narrowing of the terminal ileum. The cecum and ascending colon are filled with stool. There is mild associated inflammation about the distal ileum, with trace associated free fluid.  The more proximal small bowel is mildly distended, without evidence of a transition point. The stomach is within normal limits. No acute vascular abnormalities are seen.  The appendix is not definitely seen. There is no evidence of appendicitis. The remainder of the colon is partially filled with stool and is unremarkable in appearance.  The bladder is mildly distended and grossly unremarkable. The patient is status posthysterectomy. The left ovary is unremarkable in appearance. No suspicious adnexal masses are seen. No inguinal lymphadenopathy is seen.  No acute osseous abnormalities are identified. Multilevel vacuum phenomenon is noted along the lumbar spine; mild left convex lumbar scoliosis is noted.  IMPRESSION: 1. Distention of the distal ileum to 5.4 cm in maximal diameter, filled with relatively dense stool. This appearance is mildly worsened from the prior study, with mild persistent narrowing of the terminal ileum. However, the cecum and ascending colon are filled with stool. This likely reflects significant distal small bowel dysmotility. No definite evidence of  bowel obstruction. 2. Large staghorn calculus again noted at the lower pole the right kidney measuring 4.2 cm in size. This is increased mildly in size. Scattered bilateral renal cysts seen. 3. Small hepatic cyst noted. 4. Gallbladder mildly distended, with cholelithiasis. No definite evidence of distal obstruction. 5. Mild left convex lumbar scoliosis noted.   Electronically Signed   By: Garald Balding M.D.   On: 09/19/2014 02:54   Dg Abd Acute W/chest  09/20/2014   CLINICAL DATA:  Followup small bowel obstruction  EXAM: ACUTE ABDOMEN SERIES (ABDOMEN 2 VIEW & CHEST 1 VIEW)  COMPARISON:  September 19, 2014  FINDINGS: There is no small bowel obstruction. Air is identified throughout colon. There is no free air. Right staghorn calculus is unchanged. Degenerative joint changes of the spine are noted. There is scoliosis of spine. There is no focal infiltrate, pulmonary edema, or pleural effusion. Surgical clips are identified within the right axilla.  IMPRESSION: No small bowel obstruction. No acute abnormality identified in the abdomen.   Electronically Signed   By: Abelardo Diesel M.D.   On: 09/20/2014 12:11  Dg Abd Acute W/chest  09/19/2014   CLINICAL DATA:  Subsequent encounter.  Small bowel obstruction.  EXAM: ACUTE ABDOMEN SERIES (ABDOMEN 2 VIEW & CHEST 1 VIEW)  COMPARISON:  CT abdomen and pelvis 09/19/2014  FINDINGS: The heart size is normal. Lungs appear scratch the the lungs are clear. Surgical clips are present in the right axilla. Patient is status post right mastectomy.  Supine and upright views the abdomen demonstrate fluid levels within dilated loops of large and small bowel. There is no free air. The small bowel component is improved. A staghorn calculus is again noted within the right renal collecting system.  IMPRESSION: 1. Improving distention of large and small bowel compatible with resolving obstruction. 2. Right renal staghorn calculus.   Electronically Signed   By: Lawrence Santiago M.D.   On:  09/19/2014 18:18    Scheduled Meds: . docusate sodium  100 mg Oral BID  . DULoxetine  60 mg Oral q morning - [id number]  . folic acid  1 mg Oral Daily  . heparin  5,000 Units Subcutaneous 3 times per day  . levothyroxine  50 mcg Oral QAC breakfast  . pantoprazole  40 mg Oral Daily   Continuous Infusions:      Time spent: 25 minutes    MEMON,JEHANZEB  [facility] Pager [phone] If 7PM-7AM, please contact night-coverage at [email or web address], password [facility] 09/20/2014, 7:31 PM  LOS: 2 days

## 2014-09-20 NOTE — Progress Notes (Signed)
  Subjective:  Patient had no difficulty but full liquid diet. She is still having fleeting midabdominal pain without nausea or vomiting. She did pass some stool was liquid and black. She states she did take Pepto-Bismol before she came to the emergency room 2 days ago.   Objective: Blood pressure 104/57, pulse 72, temperature 97.8 F (36.6 C), temperature source Oral, resp. rate 20, height 5\' 4"  (1.626 m), weight 126 lb 12.8 oz (57.516 kg), SpO2 93.00%. Patient is alert and appears to be comfortable. Abdomen. Extensive midline scar noted. Bowel sounds are normal. On palpation abdomen is soft with mild mid abdominal tenderness but no guarding or rebound noted. No LE edema or clubbing noted.  Labs/studies Results:   Recent Labs  09/18/14 2029 09/20/14 0625  WBC 7.1 4.2  HGB 12.6 10.6*  HCT 37.9 32.6*  PLT 211 167    BMET   Recent Labs  09/18/14 2029 09/20/14 0625  NA 143 145  K 4.9 4.1  CL 105 108  CO2 24 27  GLUCOSE 94 91  BUN 19 11  CREATININE 1.24* 1.11*  CALCIUM 10.2 8.7    LFT   Recent Labs  09/18/14 2029 09/20/14 0625  PROT 7.5 5.8*  ALBUMIN 4.1 3.1*  AST 17 13  ALT 12 8  ALKPHOS 67 51  BILITOT 0.3 0.3     acute abdominal series reveal a few air-fluid levels in right lower quadrant of her abdomen. Most of the gas is in the colon.  Assessment:  #1. Small bowel obstruction secondary to distal colonic stricture possibly due to adhesions. She appears to be improving with supportive therapy. She is tolerating full liquids. #2.  Colonic dysmotility. #3.   Anemia. H&H was normal on admission. Drop appears to be do to hydration. She reports passing black stools today. This is possibly due to Pepto-Bismol use prior to admission but will check stool for occult blood. .   Recommendations;  Advance to heart healthy diet. Acute abdominal series in a.m. Continue polyethylene glycol. Hemoccult x1.

## 2014-09-21 DIAGNOSIS — I10 Essential (primary) hypertension: Secondary | ICD-10-CM

## 2014-09-21 LAB — CBC
HEMATOCRIT: 33 % — AB (ref 36.0–46.0)
HEMOGLOBIN: 10.8 g/dL — AB (ref 12.0–15.0)
MCH: 29.2 pg (ref 26.0–34.0)
MCHC: 32.7 g/dL (ref 30.0–36.0)
MCV: 89.2 fL (ref 78.0–100.0)
Platelets: 174 10*3/uL (ref 150–400)
RBC: 3.7 MIL/uL — ABNORMAL LOW (ref 3.87–5.11)
RDW: 12.9 % (ref 11.5–15.5)
WBC: 4.6 10*3/uL (ref 4.0–10.5)

## 2014-09-21 MED ORDER — POLYETHYLENE GLYCOL 3350 17 GM/SCOOP PO POWD: 1.0000 | Freq: Once | ORAL | Status: DC

## 2014-09-21 NOTE — Discharge Summary (Addendum)
Physician Discharge Summary  Jane Andrews. Jerl Mina [id number] DOB: 1925/06/07 DOA: 09/18/2014  PCP: Leonides Grills, MD  Admit date: 09/18/2014 Discharge date: 09/21/2014  Time spent: 40 minutes  Recommendations for Outpatient Follow-up:  1. Patient will follow with gastroenterology as an outpatient 2. Followup with primary care physician in 1-2 weeks  Discharge Diagnoses:  Principal Problem:   Abdominal pain Active Problems:   CKD (chronic kidney disease) stage 3, GFR 30-59 ml/min   Hypothyroid   Atrial fibrillation   Small bowel motility disorder Constipation  Discharge Condition: improved  Diet recommendation: low salt, high fiber  Filed Weights   09/18/14 2020 09/19/14 0638 09/20/14 0515  Weight: 54.432 kg (120 lb) 56.881 kg (125 lb 6.4 oz) 57.516 kg (126 lb 12.8 oz)    History of present illness and hospital course:  This patient was admitted to the hospital with abdominal pain. She has a history of short gut syndrome, prior history of small bowel obstructions due to adhesions. Her abdominal pain was associated with vomiting. She was evaluated with CT scan of the abdomen and pelvis which did show some narrowing of the terminal ileum. She was seen by gastroenterology and prior images were also reviewed which indicated that this narrowing of the terminal ileum was a chronic finding. She was given aggressive bowel regimen and her symptoms did improve. She is no longer having any abdominal pain or vomiting. It was recommended that she take MiraLAX on a daily basis. The remainder of her medical problems appear to be stable during this hospitalization she's been cleared for discharge.  Procedures:    Consultations:  gastroenterology  Discharge Exam: Filed Vitals:   09/21/14 1500  BP: 130/44  Pulse: 74  Temp: 98.4 F (36.9 C)  Resp: 20    General: NAD  Cardiovascular: s1, s2, rrr Respiratory: cta b  Discharge Instructions You were cared for by a hospitalist  during your hospital stay. If you have any questions about your discharge medications or the care you received while you were in the hospital after you are discharged, you can call the unit and asked to speak with the hospitalist on call if the hospitalist that took care of you is not available. Once you are discharged, your primary care physician will handle any further medical issues. Please note that NO REFILLS for any discharge medications will be authorized once you are discharged, as it is imperative that you return to your primary care physician (or establish a relationship with a primary care physician if you do not have one) for your aftercare needs so that they can reassess your need for medications and monitor your lab values.  Discharge Instructions   Call MD for:  persistant nausea and vomiting    Complete by:  As directed      Call MD for:  severe uncontrolled pain    Complete by:  As directed      Diet - low sodium heart healthy    Complete by:  As directed      Increase activity slowly    Complete by:  As directed           Discharge Medication List as of 09/21/2014  3:54 PM    START taking these medications   Details  polyethylene glycol powder (MIRALAX) powder Take 255 g by mouth once., Starting 09/21/2014, Normal      CONTINUE these medications which have NOT CHANGED   Details  ALPRAZolam (XANAX) 1 MG tablet Take 1 mg by mouth at  bedtime as needed for sleep., Until Discontinued, Historical Med    Calcium Carbonate-Vitamin D (CALCIUM + D PO) Take 1 tablet by mouth daily., Until Discontinued, Historical Med    Cyanocobalamin (VITAMIN B-12 IJ) Inject as directed every 30 (thirty) days., Until Discontinued, Historical Med    DULoxetine (CYMBALTA) 60 MG capsule Take 60 mg by mouth every morning. , Until Discontinued, Historical Med    folic acid (FOLVITE) 1 MG tablet Take 1 mg by mouth daily., Until Discontinued, Historical Med    glucosamine-chondroitin 500-400 MG tablet  Take 1 tablet by mouth every morning. , Until Discontinued, Historical Med    iron polysaccharides (NIFEREX) 150 MG capsule Take 1 capsule (150 mg total) by mouth every Monday, Wednesday, and Friday., Starting 06/27/2013, Until Discontinued, Normal    Lactobacillus (ACIDOPHILUS) 100 MG CAPS Take 100 mg by mouth every morning. , Until Discontinued, Historical Med    levothyroxine (SYNTHROID, LEVOTHROID) 50 MCG tablet Take 50 mcg by mouth every morning., Until Discontinued, Historical Med    Multiple Vitamin (MULTIVITAMIN) tablet Take 1 tablet by mouth every morning. , Until Discontinued, Historical Med    Omega-3 Fatty Acids (FISH OIL) 1000 MG CAPS Take 1,000 mg by mouth every morning. , Until Discontinued, Historical Med    pantoprazole (PROTONIX) 40 MG tablet Take 40 mg by mouth daily., Starting 10/02/2013, Until Discontinued, Historical Med      STOP taking these medications     Cephalexin 500 MG tablet        Allergies  Allergen Reactions  . Codeine Nausea And Vomiting      The results of significant diagnostics from this hospitalization (including imaging, microbiology, ancillary and laboratory) are listed below for reference.    Significant Diagnostic Studies: Ct Abdomen Pelvis W Contrast  09/19/2014   CLINICAL DATA:  Acute onset of mid abdominal pain for 12 hours. Personal history of small bowel obstruction due to adhesions. Initial encounter.  EXAM: CT ABDOMEN AND PELVIS WITH CONTRAST  TECHNIQUE: Multidetector CT imaging of the abdomen and pelvis was performed using the standard protocol following bolus administration of intravenous contrast.  CONTRAST:  27mL OMNIPAQUE IOHEXOL 300 MG/ML  SOLN  COMPARISON:  CT of the abdomen and pelvis performed 07/25/2013  FINDINGS: The visualized lung bases are clear. The right-sided breast implant is incompletely visualized but appears grossly unremarkable.  A single 0.9 cm cyst is again noted at the hepatic dome. The liver and spleen are  otherwise unremarkable in appearance. The gallbladder is mildly distended, and contains scattered layering stones, without definite evidence of distal obstruction. It is otherwise unremarkable in appearance. The pancreas and adrenal glands are within normal limits.  A large staghorn calculus is again noted at the lower pole of the right kidney, extending to the right renal pelvis. It has increased mildly in size from the prior study, now measuring approximately 4.2 cm. A 3.5 cm cyst is noted at the upper pole of the left kidney. Scattered smaller bilateral renal cysts are seen. No obstructing ureteral stones are identified. There is no definite evidence of hydronephrosis.  There is distention of the distal ileum to 5.4 cm in maximal diameter with dense stool, raising concern for significant small bowel dysmotility. There is mild persistent narrowing of the terminal ileum. The cecum and ascending colon are filled with stool. There is mild associated inflammation about the distal ileum, with trace associated free fluid.  The more proximal small bowel is mildly distended, without evidence of a transition point. The stomach is  within normal limits. No acute vascular abnormalities are seen.  The appendix is not definitely seen. There is no evidence of appendicitis. The remainder of the colon is partially filled with stool and is unremarkable in appearance.  The bladder is mildly distended and grossly unremarkable. The patient is status posthysterectomy. The left ovary is unremarkable in appearance. No suspicious adnexal masses are seen. No inguinal lymphadenopathy is seen.  No acute osseous abnormalities are identified. Multilevel vacuum phenomenon is noted along the lumbar spine; mild left convex lumbar scoliosis is noted.  IMPRESSION: 1. Distention of the distal ileum to 5.4 cm in maximal diameter, filled with relatively dense stool. This appearance is mildly worsened from the prior study, with mild persistent  narrowing of the terminal ileum. However, the cecum and ascending colon are filled with stool. This likely reflects significant distal small bowel dysmotility. No definite evidence of bowel obstruction. 2. Large staghorn calculus again noted at the lower pole the right kidney measuring 4.2 cm in size. This is increased mildly in size. Scattered bilateral renal cysts seen. 3. Small hepatic cyst noted. 4. Gallbladder mildly distended, with cholelithiasis. No definite evidence of distal obstruction. 5. Mild left convex lumbar scoliosis noted.   Electronically Signed   By: Garald Balding M.D.   On: 09/19/2014 02:54   Dg Abd Acute W/chest  09/20/2014   CLINICAL DATA:  Followup small bowel obstruction  EXAM: ACUTE ABDOMEN SERIES (ABDOMEN 2 VIEW & CHEST 1 VIEW)  COMPARISON:  September 19, 2014  FINDINGS: There is no small bowel obstruction. Air is identified throughout colon. There is no free air. Right staghorn calculus is unchanged. Degenerative joint changes of the spine are noted. There is scoliosis of spine. There is no focal infiltrate, pulmonary edema, or pleural effusion. Surgical clips are identified within the right axilla.  IMPRESSION: No small bowel obstruction. No acute abnormality identified in the abdomen.   Electronically Signed   By: Abelardo Diesel M.D.   On: 09/20/2014 12:11   Dg Abd Acute W/chest  09/19/2014   CLINICAL DATA:  Subsequent encounter.  Small bowel obstruction.  EXAM: ACUTE ABDOMEN SERIES (ABDOMEN 2 VIEW & CHEST 1 VIEW)  COMPARISON:  CT abdomen and pelvis 09/19/2014  FINDINGS: The heart size is normal. Lungs appear scratch the the lungs are clear. Surgical clips are present in the right axilla. Patient is status post right mastectomy.  Supine and upright views the abdomen demonstrate fluid levels within dilated loops of large and small bowel. There is no free air. The small bowel component is improved. A staghorn calculus is again noted within the right renal collecting system.  IMPRESSION:  1. Improving distention of large and small bowel compatible with resolving obstruction. 2. Right renal staghorn calculus.   Electronically Signed   By: Lawrence Santiago M.D.   On: 09/19/2014 18:18    Microbiology: No results found for this or any previous visit (from the past 240 hour(s)).   Labs: Basic Metabolic Panel:  Recent Labs Lab 09/18/14 2029 09/20/14 0625  NA 143 145  K 4.9 4.1  CL 105 108  CO2 24 27  GLUCOSE 94 91  BUN 19 11  CREATININE 1.24* 1.11*  CALCIUM 10.2 8.7   Liver Function Tests:  Recent Labs Lab 09/18/14 2029 09/20/14 0625  AST 17 13  ALT 12 8  ALKPHOS 67 51  BILITOT 0.3 0.3  PROT 7.5 5.8*  ALBUMIN 4.1 3.1*    Recent Labs Lab 09/19/14 0530  LIPASE 45   No  results found for this basename: AMMONIA,  in the last 168 hours CBC:  Recent Labs Lab 09/18/14 2029 09/20/14 0625 09/21/14 0641  WBC 7.1 4.2 4.6  NEUTROABS 4.3  --   --   HGB 12.6 10.6* 10.8*  HCT 37.9 32.6* 33.0*  MCV 88.3 88.6 89.2  PLT 211 167 174   Cardiac Enzymes: No results found for this basename: CKTOTAL, CKMB, CKMBINDEX, TROPONINI,  in the last 168 hours BNP: BNP (last 3 results) No results found for this basename: PROBNP,  in the last 8760 hours CBG: No results found for this basename: GLUCAP,  in the last 168 hours     Signed:  MEMON,JEHANZEB  [facility] 09/21/2014, 8:00 PM

## 2014-09-21 NOTE — Progress Notes (Signed)
  Subjective:  Patient states she still is sore to midabdomen but soreness is less than yesterday. She denies cramping. She has not had a BM today. She had 4 or 5 loose stools yesterday. Appetite is good.   Objective: Blood pressure 115/56, pulse 61, temperature 98.4 F (36.9 C), temperature source Oral, resp. rate 12, height 5\' 4"  (1.626 m), weight 126 lb 12.8 oz (57.516 kg), SpO2 95.00%. Patient is alert and in no acute distress. Abdominal exam reveals extensive midline scarring. Bowel sounds are normal. Abdomen is soft mild midabdominal tenderness without guarding or rebound.   Labs/studies Results:   Recent Labs  09/18/14 2029 09/20/14 0625 09/21/14 0641  WBC 7.1 4.2 4.6  HGB 12.6 10.6* 10.8*  HCT 37.9 32.6* 33.0*  PLT 211 167 174    BMET   Recent Labs  09/18/14 2029 09/20/14 0625  NA 143 145  K 4.9 4.1  CL 105 108  CO2 24 27  GLUCOSE 94 91  BUN 19 11  CREATININE 1.24* 1.11*  CALCIUM 10.2 8.7    LFT   Recent Labs  09/18/14 2029 09/20/14 0625  PROT 7.5 5.8*  ALBUMIN 4.1 3.1*  AST 17 13  ALT 12 8  ALKPHOS 67 51  BILITOT 0.3 0.3     Assessment:  #1. Small bowel obstruction secondary to distal stricture and she has responded to conservative therapy. She is at risk for recurrent obstruction. She needs to make sure that her bowels move regularly and she does not become constipated. #2. Anemia. Stool guaiac is pending. H&H is stable.  Recommendations;  Patient advised to take half to one scoop of polyethylene glycol daily. Patient ID for discharge from GI standpoint. Office visit in 4-6 weeks.

## 2014-09-21 NOTE — Care Management Note (Signed)
    Page 1 of 1   09/21/2014     3:25:22 PM CARE MANAGEMENT NOTE 09/21/2014  Patient:  Jane Andrews, Jane Andrews   Account Number:  [id number]  Date Initiated:  09/21/2014  Documentation initiated by:  Vladimir Creeks  Subjective/Objective Assessment:   Admitted with abd pain, ? SBO. Pt is from home, alone, is still independent, and will return home at D/C. She has family support and denies needs     Action/Plan:   No needs identified. Refuses HH   Anticipated DC Date:  09/21/2014   Anticipated DC Plan:  HOME/SELF CARE      DC Planning Services  CM consult      Choice offered to / List presented to:             Status of service:  Completed, signed off Medicare Important Message given?  YES (If response is "NO", the following Medicare IM given date fields will be blank) Date Medicare IM given:  09/21/2014 Medicare IM given by:  Vladimir Creeks Date Additional Medicare IM given:   Additional Medicare IM given by:    Discharge Disposition:  HOME/SELF CARE  Per UR Regulation:  Reviewed for med. necessity/level of care/duration of stay  If discussed at [place] of Stay Meetings, dates discussed:    Comments:  09/21/14 [place] RN/CM

## 2014-09-29 DIAGNOSIS — R5382 Chronic fatigue, unspecified: Secondary | ICD-10-CM | POA: Diagnosis not present

## 2014-09-29 DIAGNOSIS — R5383 Other fatigue: Secondary | ICD-10-CM | POA: Diagnosis not present

## 2014-09-29 DIAGNOSIS — D510 Vitamin B12 deficiency anemia due to intrinsic factor deficiency: Secondary | ICD-10-CM | POA: Diagnosis not present

## 2014-10-18 DIAGNOSIS — M25552 Pain in left hip: Secondary | ICD-10-CM | POA: Diagnosis not present

## 2014-10-18 DIAGNOSIS — Z6822 Body mass index (BMI) 22.0-22.9, adult: Secondary | ICD-10-CM | POA: Diagnosis not present

## 2014-10-23 ENCOUNTER — Other Ambulatory Visit (HOSPITAL_COMMUNITY): Payer: Self-pay | Admitting: Family Medicine

## 2014-10-23 ENCOUNTER — Ambulatory Visit (HOSPITAL_COMMUNITY)
Admission: RE | Admit: 2014-10-23 | Discharge: 2014-10-23 | Disposition: A | Discharge status: Home / Self Care | Payer: Medicare Other | Source: Ambulatory Visit | Attending: Family Medicine | Admitting: Family Medicine

## 2014-10-23 DIAGNOSIS — M25552 Pain in left hip: Secondary | ICD-10-CM | POA: Insufficient documentation

## 2014-11-06 ENCOUNTER — Other Ambulatory Visit (INDEPENDENT_AMBULATORY_CARE_PROVIDER_SITE_OTHER): Payer: Self-pay | Admitting: Internal Medicine

## 2014-11-13 ENCOUNTER — Ambulatory Visit: Payer: Medicare Other | Admitting: Orthopedic Surgery

## 2015-01-15 ENCOUNTER — Telehealth (INDEPENDENT_AMBULATORY_CARE_PROVIDER_SITE_OTHER): Payer: Self-pay | Admitting: *Deleted

## 2015-01-15 NOTE — Telephone Encounter (Signed)
Patient states that she is not sure if she is having a blockage or not. She has had 1 BM and she feels good about that. The pain/soreness is more in her stomach. She has lots of gas, doesn't want to eat much. This started at 4 am this morning. She also questions if she should stay on a liquid diet. Or if she should go to the ED. Call back number is [phone].

## 2015-01-15 NOTE — Telephone Encounter (Signed)
Dot left a message stating she is having stomach pain and would like to talk with the nurse and Dr. Laural Golden. Should she go to the ED? The return phone number is [phone].

## 2015-01-16 ENCOUNTER — Telehealth (INDEPENDENT_AMBULATORY_CARE_PROVIDER_SITE_OTHER): Payer: Self-pay | Admitting: *Deleted

## 2015-01-16 ENCOUNTER — Encounter (HOSPITAL_COMMUNITY): Payer: Self-pay | Admitting: Emergency Medicine

## 2015-01-16 ENCOUNTER — Emergency Department (HOSPITAL_COMMUNITY)
Admission: EM | Admit: 2015-01-16 | Discharge: 2015-01-16 | Disposition: A | Discharge status: Home / Self Care | Payer: Medicare Other | Attending: Emergency Medicine | Admitting: Emergency Medicine

## 2015-01-16 ENCOUNTER — Other Ambulatory Visit (INDEPENDENT_AMBULATORY_CARE_PROVIDER_SITE_OTHER): Payer: Self-pay | Admitting: Internal Medicine

## 2015-01-16 ENCOUNTER — Emergency Department (HOSPITAL_COMMUNITY): Payer: Medicare Other

## 2015-01-16 DIAGNOSIS — R109 Unspecified abdominal pain: Secondary | ICD-10-CM | POA: Diagnosis not present

## 2015-01-16 DIAGNOSIS — E039 Hypothyroidism, unspecified: Secondary | ICD-10-CM | POA: Insufficient documentation

## 2015-01-16 DIAGNOSIS — R1032 Left lower quadrant pain: Secondary | ICD-10-CM | POA: Diagnosis not present

## 2015-01-16 DIAGNOSIS — Z79899 Other long term (current) drug therapy: Secondary | ICD-10-CM | POA: Insufficient documentation

## 2015-01-16 DIAGNOSIS — K7689 Other specified diseases of liver: Secondary | ICD-10-CM | POA: Diagnosis not present

## 2015-01-16 DIAGNOSIS — N200 Calculus of kidney: Secondary | ICD-10-CM | POA: Diagnosis not present

## 2015-01-16 DIAGNOSIS — Z87442 Personal history of urinary calculi: Secondary | ICD-10-CM | POA: Diagnosis not present

## 2015-01-16 DIAGNOSIS — F329 Major depressive disorder, single episode, unspecified: Secondary | ICD-10-CM | POA: Insufficient documentation

## 2015-01-16 DIAGNOSIS — Z87891 Personal history of nicotine dependence: Secondary | ICD-10-CM | POA: Insufficient documentation

## 2015-01-16 DIAGNOSIS — K219 Gastro-esophageal reflux disease without esophagitis: Secondary | ICD-10-CM | POA: Insufficient documentation

## 2015-01-16 DIAGNOSIS — Z862 Personal history of diseases of the blood and blood-forming organs and certain disorders involving the immune mechanism: Secondary | ICD-10-CM | POA: Diagnosis not present

## 2015-01-16 DIAGNOSIS — N8320 Unspecified ovarian cysts: Secondary | ICD-10-CM | POA: Diagnosis not present

## 2015-01-16 DIAGNOSIS — Z853 Personal history of malignant neoplasm of breast: Secondary | ICD-10-CM | POA: Insufficient documentation

## 2015-01-16 DIAGNOSIS — F419 Anxiety disorder, unspecified: Secondary | ICD-10-CM | POA: Diagnosis not present

## 2015-01-16 DIAGNOSIS — K8020 Calculus of gallbladder without cholecystitis without obstruction: Secondary | ICD-10-CM | POA: Diagnosis not present

## 2015-01-16 DIAGNOSIS — I10 Essential (primary) hypertension: Secondary | ICD-10-CM | POA: Insufficient documentation

## 2015-01-16 LAB — URINALYSIS, ROUTINE W REFLEX MICROSCOPIC
BILIRUBIN URINE: NEGATIVE
Glucose, UA: NEGATIVE mg/dL
HGB URINE DIPSTICK: NEGATIVE
Ketones, ur: NEGATIVE mg/dL
NITRITE: NEGATIVE
PH: 5.5 (ref 5.0–8.0)
Protein, ur: NEGATIVE mg/dL
Urobilinogen, UA: 0.2 mg/dL (ref 0.0–1.0)

## 2015-01-16 LAB — COMPREHENSIVE METABOLIC PANEL
ALBUMIN: 3.8 g/dL (ref 3.5–5.2)
ALT: 13 U/L (ref 0–35)
ANION GAP: 6 (ref 5–15)
AST: 18 U/L (ref 0–37)
Alkaline Phosphatase: 54 U/L (ref 39–117)
BILIRUBIN TOTAL: 0.6 mg/dL (ref 0.3–1.2)
BUN: 15 mg/dL (ref 6–23)
CALCIUM: 9.2 mg/dL (ref 8.4–10.5)
CHLORIDE: 110 mmol/L (ref 96–112)
CO2: 25 mmol/L (ref 19–32)
Creatinine, Ser: 1.03 mg/dL (ref 0.50–1.10)
GFR calc Af Amer: 55 mL/min — ABNORMAL LOW (ref >90)
GFR calc non Af Amer: 47 mL/min — ABNORMAL LOW (ref >90)
Glucose, Bld: 95 mg/dL (ref 70–99)
Potassium: 4.1 mmol/L (ref 3.5–5.1)
SODIUM: 141 mmol/L (ref 135–145)
Total Protein: 6.5 g/dL (ref 6.0–8.3)

## 2015-01-16 LAB — CBC WITH DIFFERENTIAL/PLATELET
BASOS ABS: 0 10*3/uL (ref 0.0–0.1)
BASOS PCT: 0 % (ref 0–1)
EOS ABS: 0.4 10*3/uL (ref 0.0–0.7)
EOS PCT: 8 % — AB (ref 0–5)
HCT: 35.2 % — ABNORMAL LOW (ref 36.0–46.0)
HEMOGLOBIN: 11.3 g/dL — AB (ref 12.0–15.0)
LYMPHS ABS: 1.1 10*3/uL (ref 0.7–4.0)
Lymphocytes Relative: 20 % (ref 12–46)
MCH: 29.4 pg (ref 26.0–34.0)
MCHC: 32.1 g/dL (ref 30.0–36.0)
MCV: 91.4 fL (ref 78.0–100.0)
MONOS PCT: 12 % (ref 3–12)
Monocytes Absolute: 0.6 10*3/uL (ref 0.1–1.0)
NEUTROS PCT: 60 % (ref 43–77)
Neutro Abs: 3.2 10*3/uL (ref 1.7–7.7)
Platelets: 170 10*3/uL (ref 150–400)
RBC: 3.85 MIL/uL — ABNORMAL LOW (ref 3.87–5.11)
RDW: 13.8 % (ref 11.5–15.5)
WBC: 5.3 10*3/uL (ref 4.0–10.5)

## 2015-01-16 LAB — URINE MICROSCOPIC-ADD ON

## 2015-01-16 LAB — LIPASE, BLOOD: LIPASE: 31 U/L (ref 11–59)

## 2015-01-16 MED ORDER — IOHEXOL 300 MG/ML  SOLN
50.0000 mL | Freq: Once | INTRAMUSCULAR | Status: AC | PRN
  Administered 2015-01-16: 50 mL via ORAL

## 2015-01-16 MED ORDER — IOHEXOL 300 MG/ML  SOLN
100.0000 mL | Freq: Once | INTRAMUSCULAR | Status: AC | PRN
  Administered 2015-01-16: 100 mL via INTRAVENOUS

## 2015-01-16 MED ORDER — METRONIDAZOLE 250 MG PO TABS: 250.0000 mg | ORAL_TABLET | Freq: Three times a day (TID) | ORAL | Status: DC

## 2015-01-16 MED ORDER — SODIUM CHLORIDE 0.9 % IV BOLUS (SEPSIS)
500.0000 mL | Freq: Once | INTRAVENOUS | Status: AC
  Administered 2015-01-16: 500 mL via INTRAVENOUS

## 2015-01-16 NOTE — Discharge Instructions (Signed)
Abdominal Pain, Women °Abdominal (stomach, pelvic, or belly) pain can be caused by many things. It is important to tell your doctor: °· The location of the pain. °· Does it come and go or is it present all the time? °· Are there things that start the pain (eating certain foods, exercise)? °· Are there other symptoms associated with the pain (fever, nausea, vomiting, diarrhea)? °All of this is helpful to know when trying to find the cause of the pain. °CAUSES  °· Stomach: virus or bacteria infection, or ulcer. °· Intestine: appendicitis (inflamed appendix), regional ileitis (Crohn's disease), ulcerative colitis (inflamed colon), irritable bowel syndrome, diverticulitis (inflamed diverticulum of the colon), or cancer of the stomach or intestine. °· Gallbladder disease or stones in the gallbladder. °· Kidney disease, kidney stones, or infection. °· Pancreas infection or cancer. °· Fibromyalgia (pain disorder). °· Diseases of the female organs: °¨ Uterus: fibroid (non-cancerous) tumors or infection. °¨ Fallopian tubes: infection or tubal pregnancy. °¨ Ovary: cysts or tumors. °¨ Pelvic adhesions (scar tissue). °¨ Endometriosis (uterus lining tissue growing in the pelvis and on the pelvic organs). °¨ Pelvic congestion syndrome (female organs filling up with blood just before the menstrual period). °¨ Pain with the menstrual period. °¨ Pain with ovulation (producing an egg). °¨ Pain with an IUD (intrauterine device, birth control) in the uterus. °¨ Cancer of the female organs. °· Functional pain (pain not caused by a disease, may improve without treatment). °· Psychological pain. °· Depression. °DIAGNOSIS  °Your doctor will decide the seriousness of your pain by doing an examination. °· Blood tests. °· X-rays. °· Ultrasound. °· CT scan (computed tomography, special type of X-ray). °· MRI (magnetic resonance imaging). °· Cultures, for infection. °· Barium enema (dye inserted in the large intestine, to better view it with  X-rays). °· Colonoscopy (looking in intestine with a lighted tube). °· Laparoscopy (minor surgery, looking in abdomen with a lighted tube). °· Major abdominal exploratory surgery (looking in abdomen with a large incision). °TREATMENT  °The treatment will depend on the cause of the pain.  °· Many cases can be observed and treated at home. °· Over-the-counter medicines recommended by your caregiver. °· Prescription medicine. °· Antibiotics, for infection. °· Birth control pills, for painful periods or for ovulation pain. °· Hormone treatment, for endometriosis. °· Nerve blocking injections. °· Physical therapy. °· Antidepressants. °· Counseling with a psychologist or psychiatrist. °· Minor or major surgery. °HOME CARE INSTRUCTIONS  °· Do not take laxatives, unless directed by your caregiver. °· Take over-the-counter pain medicine only if ordered by your caregiver. Do not take aspirin because it can cause an upset stomach or bleeding. °· Try a clear liquid diet (broth or water) as ordered by your caregiver. Slowly move to a bland diet, as tolerated, if the pain is related to the stomach or intestine. °· Have a thermometer and take your temperature several times a day, and record it. °· Bed rest and sleep, if it helps the pain. °· Avoid sexual intercourse, if it causes pain. °· Avoid stressful situations. °· Keep your follow-up appointments and tests, as your caregiver orders. °· If the pain does not go away with medicine or surgery, you may try: °¨ Acupuncture. °¨ Relaxation exercises (yoga, meditation). °¨ Group therapy. °¨ Counseling. °SEEK MEDICAL CARE IF:  °· You notice certain foods cause stomach pain. °· Your home care treatment is not helping your pain. °· You need stronger pain medicine. °· You want your IUD removed. °· You feel faint or   lightheaded.  You develop nausea and vomiting.  You develop a rash.  You are having side effects or an allergy to your medicine. SEEK IMMEDIATE MEDICAL CARE IF:   Your  pain does not go away or gets worse.  You have a fever.  Your pain is felt only in portions of the abdomen. The right side could possibly be appendicitis. The left lower portion of the abdomen could be colitis or diverticulitis.  You are passing blood in your stools (bright red or black tarry stools, with or without vomiting).  You have blood in your urine.  You develop chills, with or without a fever.  You pass out. MAKE SURE YOU:   Understand these instructions.  Will watch your condition.  Will get help right away if you are not doing well or get worse. Document Released: 09/28/2007 Document Revised: 04/17/2014 Document Reviewed: 10/18/2009 [facility] Patient Information 2015 [place], [place]. This information is not intended to replace advice given to you by your health care provider. Make sure you discuss any questions you have with your health care provider.  CT scan showed no serious problems. Take Tylenol for pain. Clear liquids for the next 6-8 hours. Follow-up your doctor or return to force

## 2015-01-16 NOTE — ED Provider Notes (Addendum)
CSN: [id number]     Arrival date & time 01/16/15  0654 History  This chart was scribed for Nat Christen, MD by Lowella Petties, ED Scribe. The patient was seen in room APA14/APA14. Patient's care was started at 7:17 AM.   Chief Complaint  Patient presents with  . Abdominal Pain   The history is provided by the patient. No language interpreter was used.   HPI Comments: Jane Brinson. Andrews is a 81 y.o. female with a history of abdominal adhesions and small bowel removal (33ft remaining) who presents to the Emergency Department complaining of constant, aching, upper abdominal pain which migrated to her lower left abdomen and began yesterday. She reports eating mashed potatoes, broth, and a milkshake yesterday. She reports a normal bowel movement yesterday. She denies nausea, vomiting, and diarrhea. She states that her bowel problems began after she had hemorrhages giving birth and an emergency hysterectomy. She states that Dr. Milbert Coulter performed her surgery in [place].    Past Medical History  Diagnosis Date  . Hypertension   . Hypothyroid   . Abdominal adhesions 1994  . Atrial fibrillation 10/2012    Associated with severe anemia and esophageal pill impaction  . Gastroesophageal reflux disease     Hiatal hernia  . Anxiety and depression   . Cholelithiasis     asymptomatic  . Upper GI bleed 2004    Multiple episodes of melena-? due to gastritis or adverse drug effect (nonsteroidals, small bowel ulceration with Fosamax); caused by Pepto-Bismol during one Emergency Department evaluation  . Malabsorption     Short gut syndrome following small bowel resection surgery x2  . Breast carcinoma     right mastectomy "25+ years ago"  . Anemia   . Arthritis   . Low back pain   . Allergic rhinitis   . Nephrolithiasis 2004    painless hematuria   Past Surgical History  Procedure Laterality Date  . Abdominal hysterectomy      emergency s/p delivery  . Bowel resection      Resulting short gut  syndrome  . Colonoscopy w/ polypectomy  2005    Lipoma; diverticulosis  . Upper gastrointestinal endoscopy    . Mastectomy  right breast  . Colonoscopy with esophagogastroduodenoscopy (egd)  11/22/2012    [facility]  . Mastectomy      Carcinoma of the breast; right  . Abdominal hysterectomy  1960    massive gynecologic bleeding  . Laparoscopic lysis of adhesions  1965    s/p adhesions   Family History  Problem Relation Age of Onset  . Anuerysm Father   . Rheum arthritis Sister   . Healthy Sister   . COPD Sister   . Healthy Brother   . Cancer    . Colon cancer Neg Hx    History  Substance Use Topics  . Smoking status: Former Smoker -- 1.50 packs/day for 20 years    Types: Cigarettes  . Smokeless tobacco: Never Used  . Alcohol Use: No   OB History    No data available     Review of Systems  Constitutional: Negative for fever and chills.  Gastrointestinal: Positive for abdominal pain. Negative for nausea, vomiting and diarrhea.  A complete 10 system review of systems was obtained and all systems are negative except as noted in the HPI and PMH.   Allergies  Codeine  Home Medications   Prior to Admission medications   Medication Sig Start Date End Date Taking? Authorizing Provider  Cyanocobalamin (VITAMIN  B-12 IJ) Inject as directed every 30 (thirty) days.   Yes Historical Provider, MD  levothyroxine (SYNTHROID, LEVOTHROID) 50 MCG tablet Take 50 mcg by mouth every morning.   Yes Historical Provider, MD  ALPRAZolam Duanne Moron) 1 MG tablet Take 1 mg by mouth at bedtime as needed for sleep.    Historical Provider, MD  Calcium Carbonate-Vitamin D (CALCIUM + D PO) Take 1 tablet by mouth daily.    Historical Provider, MD  DULoxetine (CYMBALTA) 60 MG capsule Take 60 mg by mouth every morning.     Historical Provider, MD  folic acid (FOLVITE) 1 MG tablet Take 1 mg by mouth daily.    Historical Provider, MD  glucosamine-chondroitin 500-400 MG tablet Take 1 tablet by mouth every morning.      Historical Provider, MD  iron polysaccharides (NIFEREX) 150 MG capsule Take 1 capsule (150 mg total) by mouth every Monday, Wednesday, and Friday. 06/27/13   Rogene Houston, MD  Lactobacillus (ACIDOPHILUS) 100 MG CAPS Take 100 mg by mouth every morning.     Historical Provider, MD  metroNIDAZOLE (FLAGYL) 250 MG tablet TAKE ONE (1) TABLET THREE (3) TIMES EACH DAY Patient not taking: Reported on 01/16/2015 11/07/14   Rogene Houston, MD  Multiple Vitamin (MULTIVITAMIN) tablet Take 1 tablet by mouth every morning.     Historical Provider, MD  Omega-3 Fatty Acids (FISH OIL) 1000 MG CAPS Take 1,000 mg by mouth every morning.     Historical Provider, MD  pantoprazole (PROTONIX) 40 MG tablet Take 40 mg by mouth daily. 10/02/13   Historical Provider, MD  polyethylene glycol powder (MIRALAX) powder Take 255 g by mouth once. Patient not taking: Reported on 01/16/2015 09/21/14   Kathie Dike, MD   Triage Vitals: BP 153/85 mmHg  Pulse 84  Temp(Src) 98.1 F (36.7 C)  Resp 18  Ht 5\' 4"  (1.626 m)  Wt 120 lb (54.432 kg)  BMI 20.59 kg/m2  SpO2 98% Physical Exam  Constitutional: She is oriented to person, place, and time. She appears well-developed and well-nourished.  HENT:  Head: Normocephalic and atraumatic.  Mouth/Throat: Oropharynx is clear and moist.  Eyes: Conjunctivae and EOM are normal. Pupils are equal, round, and reactive to light.  Neck: Normal range of motion. Neck supple.  Cardiovascular: Normal rate, regular rhythm and normal heart sounds.   No murmur heard. Pulmonary/Chest: Effort normal and breath sounds normal. No respiratory distress. She has no wheezes. She has no rales. She exhibits no tenderness.  Abdominal: Soft. Bowel sounds are normal. There is tenderness (LLQ).  Musculoskeletal: Normal range of motion.  Neurological: She is alert and oriented to person, place, and time.  Skin: Skin is warm and dry.  Psychiatric: She has a normal mood and affect. Her behavior is normal.  Judgment and thought content normal.  Nursing note and vitals reviewed.   ED Course  Procedures (including critical care time) DIAGNOSTIC STUDIES: Oxygen Saturation is 98% on room air, normal by my interpretation.    COORDINATION OF CARE: 7:24 AM-Discussed treatment plan which includes abdominal CT-scan with pt at bedside and pt agreed to plan.   Results for orders placed or performed during the hospital encounter of 01/16/15  CBC with Differential/Platelet  Result Value Ref Range   WBC 5.3 4.0 - 10.5 K/uL   RBC 3.85 (L) 3.87 - 5.11 MIL/uL   Hemoglobin 11.3 (L) 12.0 - 15.0 g/dL   HCT 35.2 (L) 36.0 - 46.0 %   MCV 91.4 78.0 - 100.0 fL  MCH 29.4 26.0 - 34.0 pg   MCHC 32.1 30.0 - 36.0 g/dL   RDW 13.8 11.5 - 15.5 %   Platelets 170 150 - 400 K/uL   Neutrophils Relative % 60 43 - 77 %   Neutro Abs 3.2 1.7 - 7.7 K/uL   Lymphocytes Relative 20 12 - 46 %   Lymphs Abs 1.1 0.7 - 4.0 K/uL   Monocytes Relative 12 3 - 12 %   Monocytes Absolute 0.6 0.1 - 1.0 K/uL   Eosinophils Relative 8 (H) 0 - 5 %   Eosinophils Absolute 0.4 0.0 - 0.7 K/uL   Basophils Relative 0 0 - 1 %   Basophils Absolute 0.0 0.0 - 0.1 K/uL  Comprehensive metabolic panel  Result Value Ref Range   Sodium 141 135 - 145 mmol/L   Potassium 4.1 3.5 - 5.1 mmol/L   Chloride 110 96 - 112 mmol/L   CO2 25 19 - 32 mmol/L   Glucose, Bld 95 70 - 99 mg/dL   BUN 15 6 - 23 mg/dL   Creatinine, Ser 1.03 0.50 - 1.10 mg/dL   Calcium 9.2 8.4 - 10.5 mg/dL   Total Protein 6.5 6.0 - 8.3 g/dL   Albumin 3.8 3.5 - 5.2 g/dL   AST 18 0 - 37 U/L   ALT 13 0 - 35 U/L   Alkaline Phosphatase 54 39 - 117 U/L   Total Bilirubin 0.6 0.3 - 1.2 mg/dL   GFR calc non Af Amer 47 (L) >90 mL/min   GFR calc Af Amer 55 (L) >90 mL/min   Anion gap 6 5 - 15  Urinalysis, Routine w reflex microscopic  Result Value Ref Range   Color, Urine YELLOW YELLOW   APPearance CLEAR CLEAR   Specific Gravity, Urine <1.005 (L) 1.005 - 1.030   pH 5.5 5.0 - 8.0    Glucose, UA NEGATIVE NEGATIVE mg/dL   Hgb urine dipstick NEGATIVE NEGATIVE   Bilirubin Urine NEGATIVE NEGATIVE   Ketones, ur NEGATIVE NEGATIVE mg/dL   Protein, ur NEGATIVE NEGATIVE mg/dL   Urobilinogen, UA 0.2 0.0 - 1.0 mg/dL   Nitrite NEGATIVE NEGATIVE   Leukocytes, UA SMALL (A) NEGATIVE  Lipase, blood  Result Value Ref Range   Lipase 31 11 - 59 U/L  Urine microscopic-add on  Result Value Ref Range   Squamous Epithelial / LPF FEW (A) RARE   WBC, UA 3-6 <3 WBC/hpf   Ct Abdomen Pelvis W Contrast  01/16/2015   CLINICAL DATA:  Upper abdominal pain radiating to LEFT side for 1 day, history of abdominal adhesions with small bowel resection, remote RIGHT breast cancer, hypertension, atrial fibrillation, GERD, former smoker  EXAM: CT ABDOMEN AND PELVIS WITH CONTRAST  TECHNIQUE: Multidetector CT imaging of the abdomen and pelvis was performed using the standard protocol following bolus administration of intravenous contrast. Sagittal and coronal MPR images reconstructed from axial data set.  CONTRAST:  163mL OMNIPAQUE IOHEXOL 300 MG/ML SOLN IV. Dilute oral contrast.  COMPARISON:  09/19/2014  FINDINGS: Lung bases clear.  RIGHT breast prosthesis post mastectomy.  Calcified gallstones dependently in gallbladder.  Cyst at upper pole LEFT kidney 3.6 x 2.9 cm image 19.  Additional tiny BILATERAL renal and hepatic cysts.  Large staghorn calculus RIGHT kidney, approximately 4 cm diameter unchanged.  Liver, spleen, pancreas, kidneys, and adrenal glands otherwise normal.  Appendix not identified.  Single small bowel loop in the upper central pelvis is slightly prominent size similar to previous exam without obstruction.  Stomach and bowel loops  otherwise normal appearance.  Uterus surgically absent with normal size LEFT ovary and 3.0 x 2.1 cm diameter cyst within RIGHT ovary.  Small fluid attenuation collection/cyst at RIGHT perirectal region 17 x 17 x 21 mm image 57 unchanged since 06/13/2012.  No new mass,  adenopathy, free fluid, free air, hernia, or inflammatory process.  Degenerative disc and facet disease changes lower lumbar spine.  IMPRESSION: No acute intra-abdominal or intrapelvic abnormalities.  Large staghorn calculus RIGHT kidney without hydronephrosis.  Cholelithiasis.  Renal, hepatic, and RIGHT ovarian cysts.  Small cyst RIGHT perirectal unchanged.   Electronically Signed   By: Lavonia Dana M.D.   On: 01/16/2015 09:28      EKG Interpretation None      MDM   Final diagnoses:  Abdominal pain   CT scan showed no acute anomalies. No acute abdomen. White count, liver functions, lipase all normal.  Discussed findings with patient including cholelithiasis and staghorn calculus in right kidney. She will follow-up with her primary care doctor.  I personally performed the services described in this documentation, which was scribed in my presence. The recorded information has been reviewed and is accurate.    Nat Christen, MD 01/16/15 1231  Nat Christen, MD 01/18/15 [phone]

## 2015-01-16 NOTE — ED Notes (Signed)
Patient with no complaints at this time. Respirations even and unlabored. Skin warm/dry. Discharge instructions reviewed with patient at this time. Patient given opportunity to voice concerns/ask questions. IV removed per policy and band-aid applied to site. Patient discharged at this time and left Emergency Department with steady gait.  

## 2015-01-16 NOTE — Telephone Encounter (Signed)
ER records reviewed; Talked with patient over the phone; She is having excessive flatulence and diarrhea and bloating. CT reveals dilated loops of small bowel but better than they were in October 2015. Will treat with metronidazole 250 mg 3 times a day for 10 days. Issue advised to call office with progress report in 4 days.

## 2015-01-16 NOTE — Telephone Encounter (Signed)
Patient is an ER for evaluation She possibly has recurrent small bowel obstruction.

## 2015-01-16 NOTE — Telephone Encounter (Signed)
Dr.Rehman sent the note to me.

## 2015-01-16 NOTE — ED Notes (Signed)
Patient reports abdominal pain that started yesterday. She states the pain is in "the pit of my stomach and felt like it does when my bowels lock up". LBM yesterday and normal. Denies Urinary issues. Denies n/v/d.

## 2015-01-17 NOTE — Telephone Encounter (Signed)
Noted  

## 2015-01-22 ENCOUNTER — Telehealth (INDEPENDENT_AMBULATORY_CARE_PROVIDER_SITE_OTHER): Payer: Self-pay | Admitting: *Deleted

## 2015-01-22 NOTE — Telephone Encounter (Signed)
Dot LM stating she is doing okay. She is able to eat and has no pain. Her return phone number is [phone].

## 2015-01-22 NOTE — Telephone Encounter (Signed)
Dr.Rehman was made aware. 

## 2015-02-06 DIAGNOSIS — R5382 Chronic fatigue, unspecified: Secondary | ICD-10-CM | POA: Diagnosis not present

## 2015-02-06 DIAGNOSIS — D510 Vitamin B12 deficiency anemia due to intrinsic factor deficiency: Secondary | ICD-10-CM | POA: Diagnosis not present

## 2015-02-21 DIAGNOSIS — E538 Deficiency of other specified B group vitamins: Secondary | ICD-10-CM | POA: Diagnosis not present

## 2015-02-21 DIAGNOSIS — E039 Hypothyroidism, unspecified: Secondary | ICD-10-CM | POA: Diagnosis not present

## 2015-02-21 DIAGNOSIS — Z6821 Body mass index (BMI) 21.0-21.9, adult: Secondary | ICD-10-CM | POA: Diagnosis not present

## 2015-02-21 DIAGNOSIS — D649 Anemia, unspecified: Secondary | ICD-10-CM | POA: Diagnosis not present

## 2015-02-21 DIAGNOSIS — M810 Age-related osteoporosis without current pathological fracture: Secondary | ICD-10-CM | POA: Diagnosis not present

## 2015-04-04 ENCOUNTER — Encounter (INDEPENDENT_AMBULATORY_CARE_PROVIDER_SITE_OTHER): Payer: Self-pay | Admitting: *Deleted

## 2015-04-26 ENCOUNTER — Encounter: Payer: Self-pay | Admitting: Orthopedic Surgery

## 2015-04-26 ENCOUNTER — Ambulatory Visit (INDEPENDENT_AMBULATORY_CARE_PROVIDER_SITE_OTHER): Payer: Medicare Other | Admitting: Orthopedic Surgery

## 2015-04-26 VITALS — BP 132/75 | Ht 64.0 in | Wt 120.0 lb

## 2015-04-26 DIAGNOSIS — M4806 Spinal stenosis, lumbar region: Secondary | ICD-10-CM

## 2015-04-26 DIAGNOSIS — M48061 Spinal stenosis, lumbar region without neurogenic claudication: Secondary | ICD-10-CM

## 2015-04-26 MED ORDER — METHYLPREDNISOLONE ACETATE 40 MG/ML IJ SUSP
40.0000 mg | Freq: Once | INTRAMUSCULAR | Status: AC
Start: 2015-04-26 — End: 2015-04-26
  Administered 2015-04-26: 40 mg via INTRAMUSCULAR

## 2015-04-26 NOTE — Progress Notes (Signed)
Subjective:     Patient ID: Jane Andrews, female   DOB: 12-Jul-1925, 81 y.o.   MRN: [id number]  Chief Complaint  Patient presents with  . Hip Pain    Left hip pain, no injury.    Hip Pain    This is an 81 year old female with a history of lumbar disc disease at epidurals back in 2013 she also had an MRI which showed she had spinal stenosis primarily in the left side. She presents now with left hip pain which is really in her left buttock area. She's had pain for 2 months but her pain is improved with ibuprofen and Aleve. She had constant pain in the left hip area radiating behind the left thigh. She did not describe burning just a dull throbbing aching sensation.  No groin pain and no anterior thigh pain  Review of Systems Please see the H&P area. She does complain of any irregular heartbeat heart murmur heart palpitations ringing of the ears diarrhea depression but denies any loss of bladder or bowel control.  Allergy to codeine causes nausea  Family history of depression cancer arthritis osteoporosis social history she does not drink or smoke  Her medical problems include hypertension depression cancer arthritis osteoporosis and thyroid disease    Objective:   Physical Exam BP 132/75 mmHg  Ht 5\' 4"  (1.626 m)  Wt 120 lb (54.432 kg)  BMI 20.59 kg/m2 Her body habitus small she is ectomorphic she is oriented 3 her mood is normal her gait is normal  She has normal hip motion no thigh or groin pain with internal rotation or flexion of the hip she has tenderness in the left buttock and lower back left hip is stable there is no atrophy in the left lower extremity  Back and hip skin is normal  Neurovascular exam is intact with normal sensation and pulses in both lower extremities     Assessment:     MRI lumbar spine reviewed the report there and it shows she has significant spinal stenosis back in 2013, she had a CT scan of her hip which allowed me to look at her back in the  bony windows and she has severe scoliosis and degenerative disc disease with spinal stenosis    Plan:     She has improved so she still wanted to have an IM shot of Depo-Medrol and that was given with the understanding if it doesn't improve she will need plain films MRI and referral to neurosurgery  A steroid injection was performed at  left buttock  using 1% plain Lidocaine and 40  mg of depo Medrol. This was well tolerated.

## 2015-04-26 NOTE — Patient Instructions (Signed)
Continue aleve and ibuprofen

## 2015-04-26 NOTE — Addendum Note (Signed)
Addended by: Baldomero Lamy B on: 04/26/2015 12:09 PM   Modules accepted: Orders

## 2015-06-28 ENCOUNTER — Encounter (INDEPENDENT_AMBULATORY_CARE_PROVIDER_SITE_OTHER): Payer: Self-pay | Admitting: Internal Medicine

## 2015-06-28 ENCOUNTER — Ambulatory Visit (INDEPENDENT_AMBULATORY_CARE_PROVIDER_SITE_OTHER): Payer: Medicare Other | Admitting: Internal Medicine

## 2015-06-28 VITALS — BP 118/58 | HR 88 | Temp 97.5°F | Ht 64.0 in | Wt 121.6 lb

## 2015-06-28 DIAGNOSIS — K219 Gastro-esophageal reflux disease without esophagitis: Secondary | ICD-10-CM | POA: Diagnosis not present

## 2015-06-28 DIAGNOSIS — K912 Postsurgical malabsorption, not elsewhere classified: Secondary | ICD-10-CM

## 2015-06-28 NOTE — Progress Notes (Signed)
Subjective:    Patient ID: Jane Andrews. Stoves, female    DOB: 1925/02/16, 81 y.o.   MRN: [id number]  HPIHere today for f/u of iron deficiency anemia, short gut syndrome and GERD. She tells me her acid reflux is controlled with Protonix. Hx of short gut syndrome for hx of adhesions in 1994 from a previous surgery (Hysterectomy) Appetite is good. She has lost about 2 pounds since her last visit in July of 2015 There is no abdominal pain. She has  BM x 1 daily. No melena or BRRB.  She eats at the [facility] every morning. She is exercising by walking.  She is walking 3-4 times a week.at [facility]. She tells me she is tired and sleepy a lot. She says she is forgetting things.    CBC    Component Value Date/Time   WBC 5.3 01/16/2015 0747   RBC 3.85* 01/16/2015 0747   RBC 4.00 01/11/2013 1515   HGB 11.3* 01/16/2015 0747   HCT 35.2* 01/16/2015 0747   PLT 170 01/16/2015 0747   MCV 91.4 01/16/2015 0747   MCH 29.4 01/16/2015 0747   MCHC 32.1 01/16/2015 0747   RDW 13.8 01/16/2015 0747   LYMPHSABS 1.1 01/16/2015 0747   MONOABS 0.6 01/16/2015 0747   EOSABS 0.4 01/16/2015 0747   BASOSABS 0.0 01/16/2015 0747       11/22/2012 EGD & Colonoscopy Indications: Patient is 81 year old Caucasian female with multiple medical problems who recently developed acute and chronic anemia requiring transfusion. GI bleed was not documented that she has history of peptic ulcer disease as well as distal small bowel ulcers. She is on alendronate for osteoporosis.  Impression:  Normal esophagogastroduodenoscopy. Few diverticula at sigmoid colon and small external hemorrhoids otherwise normal colonoscopy to ileocolonic anastomosis located in the vicinity of hepatic flexure or ascending colon.  Review of Systems Past Medical History  Diagnosis Date  . Hypertension   . Hypothyroid   . Abdominal adhesions 1994  . Atrial fibrillation 10/2012    Associated with severe anemia and esophageal pill impaction  .  Gastroesophageal reflux disease     Hiatal hernia  . Anxiety and depression   . Cholelithiasis     asymptomatic  . Upper GI bleed 2004    Multiple episodes of melena-? due to gastritis or adverse drug effect (nonsteroidals, small bowel ulceration with Fosamax); caused by Pepto-Bismol during one Emergency Department evaluation  . Malabsorption     Short gut syndrome following small bowel resection surgery x2  . Breast carcinoma     right mastectomy "25+ years ago"  . Anemia   . Arthritis   . Low back pain   . Allergic rhinitis   . Nephrolithiasis 2004    painless hematuria    Past Surgical History  Procedure Laterality Date  . Abdominal hysterectomy      emergency s/p delivery  . Bowel resection      Resulting short gut syndrome  . Colonoscopy w/ polypectomy  2005    Lipoma; diverticulosis  . Upper gastrointestinal endoscopy    . Mastectomy  right breast  . Colonoscopy with esophagogastroduodenoscopy (egd)  11/22/2012    [facility]  . Mastectomy      Carcinoma of the breast; right  . Abdominal hysterectomy  1960    massive gynecologic bleeding  . Laparoscopic lysis of adhesions  1965    s/p adhesions    Allergies  Allergen Reactions  . Codeine Nausea And Vomiting    Current Outpatient Prescriptions on File  Prior to Visit  Medication Sig Dispense Refill  . ALPRAZolam (XANAX) 1 MG tablet Take 1 mg by mouth at bedtime as needed for sleep.    . Calcium Carbonate-Vitamin D (CALCIUM + D PO) Take 1 tablet by mouth daily.    . Cyanocobalamin (VITAMIN B-12 IJ) Inject as directed every 30 (thirty) days.    . DULoxetine (CYMBALTA) 60 MG capsule Take 60 mg by mouth every morning.     . folic acid (FOLVITE) 1 MG tablet Take 1 mg by mouth daily.    Marland Kitchen glucosamine-chondroitin 500-400 MG tablet Take 1 tablet by mouth every morning.     . iron polysaccharides (NIFEREX) 150 MG capsule Take 1 capsule (150 mg total) by mouth every Monday, Wednesday, and Friday. (Patient not taking: Reported  on 06/28/2015) 30 capsule 5  . Lactobacillus (ACIDOPHILUS) 100 MG CAPS Take 100 mg by mouth every morning.     Marland Kitchen levothyroxine (SYNTHROID, LEVOTHROID) 50 MCG tablet Take 50 mcg by mouth every morning.    . Multiple Vitamin (MULTIVITAMIN) tablet Take 1 tablet by mouth every morning.     . Omega-3 Fatty Acids (FISH OIL) 1000 MG CAPS Take 1,000 mg by mouth every morning.     . pantoprazole (PROTONIX) 40 MG tablet Take 40 mg by mouth daily.    . polyethylene glycol powder (MIRALAX) powder Take 255 g by mouth once. (Patient taking differently: Take 1 Container by mouth as needed. ) 255 g 0   No current facility-administered medications on file prior to visit.        Objective:   Physical ExamBlood pressure 118/58, pulse 88, temperature 97.5 F (36.4 C), height 5\' 4"  (1.626 m), weight 121 lb 9.6 oz (55.157 kg).  Alert and oriented. Skin warm and dry. Oral mucosa is moist.   . Sclera anicteric, conjunctivae is pink. Thyroid not enlarged. No cervical lymphadenopathy. Lungs clear. Heart regular rate and rhythm.  Abdomen is soft. Bowel sounds are positive. No hepatomegaly. No abdominal masses felt. No tenderness.  No edema to lower extremities.         Assessment & Plan:  GERD controlled at this time with Protonix. Hx of short gut syndrome. She is having one still a day. She has no GI complaints.  OV in one year.

## 2015-06-28 NOTE — Patient Instructions (Signed)
Continue Protonix. OV in 1 yr.

## 2015-10-03 ENCOUNTER — Emergency Department (HOSPITAL_COMMUNITY): Payer: Medicare Other

## 2015-10-03 ENCOUNTER — Emergency Department (HOSPITAL_COMMUNITY)
Admission: EM | Admit: 2015-10-03 | Discharge: 2015-10-03 | Disposition: A | Discharge status: Home / Self Care | Payer: Medicare Other | Attending: Emergency Medicine | Admitting: Emergency Medicine

## 2015-10-03 ENCOUNTER — Encounter (HOSPITAL_COMMUNITY): Payer: Self-pay | Admitting: Emergency Medicine

## 2015-10-03 DIAGNOSIS — Z79899 Other long term (current) drug therapy: Secondary | ICD-10-CM | POA: Diagnosis not present

## 2015-10-03 DIAGNOSIS — F329 Major depressive disorder, single episode, unspecified: Secondary | ICD-10-CM | POA: Insufficient documentation

## 2015-10-03 DIAGNOSIS — K219 Gastro-esophageal reflux disease without esophagitis: Secondary | ICD-10-CM | POA: Insufficient documentation

## 2015-10-03 DIAGNOSIS — I10 Essential (primary) hypertension: Secondary | ICD-10-CM | POA: Insufficient documentation

## 2015-10-03 DIAGNOSIS — Z8739 Personal history of other diseases of the musculoskeletal system and connective tissue: Secondary | ICD-10-CM | POA: Diagnosis not present

## 2015-10-03 DIAGNOSIS — Z87891 Personal history of nicotine dependence: Secondary | ICD-10-CM | POA: Diagnosis not present

## 2015-10-03 DIAGNOSIS — Z87442 Personal history of urinary calculi: Secondary | ICD-10-CM | POA: Diagnosis not present

## 2015-10-03 DIAGNOSIS — B349 Viral infection, unspecified: Secondary | ICD-10-CM

## 2015-10-03 DIAGNOSIS — J9811 Atelectasis: Secondary | ICD-10-CM | POA: Diagnosis not present

## 2015-10-03 DIAGNOSIS — J209 Acute bronchitis, unspecified: Secondary | ICD-10-CM

## 2015-10-03 DIAGNOSIS — D649 Anemia, unspecified: Secondary | ICD-10-CM | POA: Insufficient documentation

## 2015-10-03 DIAGNOSIS — F419 Anxiety disorder, unspecified: Secondary | ICD-10-CM | POA: Insufficient documentation

## 2015-10-03 DIAGNOSIS — E039 Hypothyroidism, unspecified: Secondary | ICD-10-CM | POA: Insufficient documentation

## 2015-10-03 DIAGNOSIS — Z853 Personal history of malignant neoplasm of breast: Secondary | ICD-10-CM | POA: Insufficient documentation

## 2015-10-03 DIAGNOSIS — R0602 Shortness of breath: Secondary | ICD-10-CM

## 2015-10-03 MED ORDER — AEROCHAMBER Z-STAT PLUS/MEDIUM MISC: 1.0000 | Freq: Once | Status: DC

## 2015-10-03 MED ORDER — ALBUTEROL SULFATE HFA 108 (90 BASE) MCG/ACT IN AERS
2.0000 | INHALATION_SPRAY | RESPIRATORY_TRACT | Status: DC | PRN
  Administered 2015-10-03: 2 via RESPIRATORY_TRACT
  Filled 2015-10-03: qty 6.7

## 2015-10-03 MED ORDER — ALBUTEROL SULFATE (2.5 MG/3ML) 0.083% IN NEBU
5.0000 mg | INHALATION_SOLUTION | Freq: Once | RESPIRATORY_TRACT | Status: AC
  Administered 2015-10-03: 5 mg via RESPIRATORY_TRACT
  Filled 2015-10-03: qty 6

## 2015-10-03 MED ORDER — IPRATROPIUM BROMIDE 0.02 % IN SOLN
0.5000 mg | Freq: Once | RESPIRATORY_TRACT | Status: AC
  Administered 2015-10-03: 0.5 mg via RESPIRATORY_TRACT
  Filled 2015-10-03: qty 2.5

## 2015-10-03 NOTE — Discharge Instructions (Signed)
Use the inhaler for your shortness of breath or wheezing. Take the cough medication you have as needed, you can also use cough lozenges or mucinex DM OTC for cough.  Recheck if you are still coughing in the next 7-10 days, you get a high fever or your breathing gets worse.    Metered Dose Inhaler With Spacer Inhaled medicines are the basis of treatment of asthma and other breathing problems. Inhaled medicine can only be effective if used properly. Good technique assures that the medicine reaches the lungs. Your health care provider has asked you to use a spacer with your inhaler to help you take the medicine more effectively. A spacer is a plastic tube with a mouthpiece on one end and an opening that connects to the inhaler on the other end. Metered dose inhalers (MDIs) are used to deliver a variety of inhaled medicines. These include quick relief or rescue medicines (such as bronchodilators) and controller medicines (such as corticosteroids). The medicine is delivered by pushing down on a metal canister to release a set amount of spray. If you are using different kinds of inhalers, use your quick relief medicine to open the airways 10-15 minutes before using a steroid if instructed to do so by your health care provider. If you are unsure which inhalers to use and the order of using them, ask your health care provider, nurse, or respiratory therapist. HOW TO USE THE INHALER WITH A SPACER  Remove cap from inhaler.  If you are using the inhaler for the first time, you will need to prime it. Shake the inhaler for 5 seconds and release four puffs into the air, away from your face. Ask your health care provider or pharmacist if you have questions about priming your inhaler.  Shake inhaler for 5 seconds before each breath in (inhalation).  Place the open end of the spacer onto the mouthpiece of the inhaler.  Position the inhaler so that the top of the canister faces up and the spacer mouthpiece faces  you.  Put your index finger on the top of the medicine canister. Your thumb supports the bottom of the inhaler and the spacer.  Breathe out (exhale) normally and as completely as possible.  Immediately after exhaling, place the spacer between your teeth and into your mouth. Close your mouth tightly around the spacer.  Press the canister down with the index finger to release the medicine.  At the same time as the canister is pressed, inhale deeply and slowly until the lungs are completely filled. This should take 4-6 seconds. Keep your tongue down and out of the way.  Hold the medicine in your lungs for 5-10 seconds (10 seconds is best). This helps the medicine get into the small airways of your lungs. Exhale.  Repeat inhaling deeply through the spacer mouthpiece. Again hold that breath for up to 10 seconds (10 seconds is best). Exhale slowly. If it is difficult to take this second deep breath through the spacer, breathe normally several times through the spacer. Remove the spacer from your mouth.  Wait at least 15-30 seconds between puffs. Continue with the above steps until you have taken the number of puffs your health care provider has ordered. Do not use the inhaler more than your health care provider directs you to.  Remove spacer from the inhaler and place cap on inhaler.  Follow the directions from your health care provider or the inhaler insert for cleaning the inhaler and spacer. If you are using a  steroid inhaler, rinse your mouth with water after your last puff, gargle, and spit out the water. Do not swallow the water. AVOID:  Inhaling before or after starting the spray of medicine. It takes practice to coordinate your breathing with triggering the spray.  Inhaling through the nose (rather than the mouth) when triggering the spray. HOW TO DETERMINE IF YOUR INHALER IS FULL OR NEARLY EMPTY You cannot know when an inhaler is empty by shaking it. A few inhalers are now being made  with dose counters. Ask your health care provider for a prescription that has a dose counter if you feel you need that extra help. If your inhaler does not have a counter, ask your health care provider to help you determine the date you need to refill your inhaler. Write the refill date on a calendar or your inhaler canister. Refill your inhaler 7-10 days before it runs out. Be sure to keep an adequate supply of medicine. This includes making sure it is not expired, and you have a spare inhaler.  SEEK MEDICAL CARE IF:   Symptoms are only partially relieved with your inhaler.  You are having trouble using your inhaler.  You experience some increase in phlegm. SEEK IMMEDIATE MEDICAL CARE IF:   You feel little or no relief with your inhalers. You are still wheezing and are feeling shortness of breath or tightness in your chest or both.  You have dizziness, headaches, or fast heart rate.  You have chills, fever, or night sweats.  There is a noticeable increase in phlegm production, or there is blood in the phlegm.   This information is not intended to replace advice given to you by your health care provider. Make sure you discuss any questions you have with your health care provider.   Document Released: 12/01/2005 Document Revised: 04/17/2015 Document Reviewed: 05/19/2013 Elsevier Interactive Patient Education 2016 Else[employer]  Cough, Adult A cough helps to clear your throat and lungs. A cough may last only 2-3 weeks (acute), or it may last longer than 8 weeks (chronic). Many different things can cause a cough. A cough may be a sign of an illness or another medical condition. HOME CARE  Pay attention to any changes in your cough.  Take medicines only as told by your doctor.  Talk with your doctor before you try using a cough medicine.  Drink enough fluid to keep your pee (urine) clear or pale yellow.  If the air is dry, use a cold steam vaporizer or humidifier in your home.  Stay  away from things that make you cough at work or at home.  If your cough is worse at night, try using extra pillows to raise your head up higher while you sleep.  Do not smoke, and try not to be around smoke. If you need help quitting, ask your doctor.  Do not have caffeine.  Do not drink alcohol.  Rest as needed. GET HELP IF:  You have new problems (symptoms).  You cough up yellow fluid (pus).  Your cough does not get better after 2-3 weeks, or your cough gets worse.  Medicine does not help your cough and you are not sleeping well.  You have pain that gets worse or pain that is not helped with medicine.  You have a fever.  You are losing weight and you do not know why.  You have night sweats. GET HELP RIGHT AWAY IF:  You cough up blood.  You have trouble breathing.  Your  heartbeat is very fast.   This information is not intended to replace advice given to you by your health care provider. Make sure you discuss any questions you have with your health care provider.   Document Released: 08/14/2011 Document Revised: 08/22/2015 Document Reviewed: 02/07/2015 Elsevier Interactive Patient Education [employer].

## 2015-10-03 NOTE — ED Provider Notes (Signed)
CSN: [id number]     Arrival date & time 10/03/15  0509 History   First MD Initiated Contact with Patient 10/03/15 0535   Chief Complaint  Patient presents with  . Shortness of Breath     (Consider location/radiation/quality/duration/timing/severity/associated sxs/prior Treatment) HPI patient reports a couple days ago she started getting a scratchy throat. She states yesterday she had a mild cough. She states this morning she coughed once and coughed up some mucus. She had some rhinorrhea yesterday. She denies any fever. She states she took a prescription cough syrup the contained hydrocodone for her cough. She states this morning she started feeling short of breath and states her breathing was "heavy". She states it lasted about 30 minutes. She denies chest pain. She states she continues to have a sore throat and she feels weak. She denies wheezing but states she does hear noise in her chest when she breathes. She has a mild headache. She denies nausea, vomiting, diarrhea. She states she's had to use an inhaler in the past last time was probably a year ago. She denies being around anybody else who is ill.`  PCP Dr Marisue Brooklyn  Past Medical History  Diagnosis Date  . Hypertension   . Hypothyroid   . Abdominal adhesions 1994  . Atrial fibrillation ([place]) 10/2012    Associated with severe anemia and esophageal pill impaction  . Gastroesophageal reflux disease     Hiatal hernia  . Anxiety and depression   . Cholelithiasis     asymptomatic  . Upper GI bleed 2004    Multiple episodes of melena-? due to gastritis or adverse drug effect (nonsteroidals, small bowel ulceration with Fosamax); caused by Pepto-Bismol during one Emergency Department evaluation  . Malabsorption     Short gut syndrome following small bowel resection surgery x2  . Breast carcinoma ([place])     right mastectomy "25+ years ago"  . Anemia   . Arthritis   . Low back pain   . Allergic rhinitis   . Nephrolithiasis 2004   painless hematuria   Past Surgical History  Procedure Laterality Date  . Abdominal hysterectomy      emergency s/p delivery  . Bowel resection      Resulting short gut syndrome  . Colonoscopy w/ polypectomy  2005    Lipoma; diverticulosis  . Upper gastrointestinal endoscopy    . Mastectomy  right breast  . Colonoscopy with esophagogastroduodenoscopy (egd)  11/22/2012    [facility]  . Mastectomy      Carcinoma of the breast; right  . Abdominal hysterectomy  1960    massive gynecologic bleeding  . Laparoscopic lysis of adhesions  1965    s/p adhesions   Family History  Problem Relation Age of Onset  . Anuerysm Father   . Rheum arthritis Sister   . Healthy Sister   . COPD Sister   . Healthy Brother   . Cancer    . Colon cancer Neg Hx    Social History  Substance Use Topics  . Smoking status: Former Smoker -- 1.50 packs/day for 20 years    Types: Cigarettes  . Smokeless tobacco: Never Used  . Alcohol Use: No   Lives at home Lives alone  OB History    No data available     Review of Systems  All other systems reviewed and are negative.     Allergies  Codeine  Home Medications   Prior to Admission medications   Medication Sig Start Date End Date Taking?  Authorizing Provider  ALPRAZolam Duanne Moron) 1 MG tablet Take 1 mg by mouth at bedtime as needed for sleep.    Historical Provider, MD  Calcium Carbonate-Vitamin D (CALCIUM + D PO) Take 1 tablet by mouth daily.    Historical Provider, MD  Cyanocobalamin (VITAMIN B-12 IJ) Inject as directed every 30 (thirty) days.    Historical Provider, MD  DULoxetine (CYMBALTA) 60 MG capsule Take 60 mg by mouth every morning.     Historical Provider, MD  folic acid (FOLVITE) 1 MG tablet Take 1 mg by mouth daily.    Historical Provider, MD  glucosamine-chondroitin 500-400 MG tablet Take 1 tablet by mouth every morning.     Historical Provider, MD  iron polysaccharides (NIFEREX) 150 MG capsule Take 1 capsule (150 mg total) by mouth  every Monday, Wednesday, and Friday. Patient not taking: Reported on 06/28/2015 06/27/13   Rogene Houston, MD  Lactobacillus (ACIDOPHILUS) 100 MG CAPS Take 100 mg by mouth every morning.     Historical Provider, MD  levothyroxine (SYNTHROID, LEVOTHROID) 50 MCG tablet Take 50 mcg by mouth every morning.    Historical Provider, MD  Multiple Vitamin (MULTIVITAMIN) tablet Take 1 tablet by mouth every morning.     Historical Provider, MD  Omega-3 Fatty Acids (FISH OIL) 1000 MG CAPS Take 1,000 mg by mouth every morning.     Historical Provider, MD  pantoprazole (PROTONIX) 40 MG tablet Take 40 mg by mouth daily. 10/02/13   Historical Provider, MD  polyethylene glycol powder (MIRALAX) powder Take 255 g by mouth once. Patient taking differently: Take 1 Container by mouth as needed.  09/21/14   Kathie Dike, MD   BP 145/76 mmHg  Pulse 76  Temp(Src) 98.8 F (37.1 C) (Oral)  Resp 18  Ht 5\' 4"  (1.626 m)  Wt 125 lb (56.7 kg)  BMI 21.45 kg/m2  SpO2 96%  Vital signs normal   Physical Exam  Constitutional: She is oriented to person, place, and time. She appears well-developed and well-nourished.  Non-toxic appearance. She does not appear ill. No distress.  HENT:  Head: Normocephalic and atraumatic.  Right Ear: External ear normal.  Left Ear: External ear normal.  Nose: Nose normal. No mucosal edema or rhinorrhea.  Mouth/Throat: Oropharynx is clear and moist and mucous membranes are normal. No dental abscesses or uvula swelling.  Eyes: Conjunctivae and EOM are normal. Pupils are equal, round, and reactive to light.  Neck: Normal range of motion and full passive range of motion without pain. Neck supple.  Cardiovascular: Normal rate, regular rhythm and normal heart sounds.  Exam reveals no gallop and no friction rub.   No murmur heard. Pulmonary/Chest: Effort normal. No respiratory distress. She has no wheezes. She has rhonchi. She has no rales. She exhibits no tenderness and no crepitus.  Abdominal:  Soft. Normal appearance and bowel sounds are normal. She exhibits no distension. There is no tenderness. There is no rebound and no guarding.  Musculoskeletal: Normal range of motion. She exhibits no edema or tenderness.  Moves all extremities well.   Neurological: She is alert and oriented to person, place, and time. She has normal strength. No cranial nerve deficit.  Skin: Skin is warm, dry and intact. No rash noted. No erythema. No pallor.  Psychiatric: She has a normal mood and affect. Her speech is normal and behavior is normal. Her mood appears not anxious.  Nursing note and vitals reviewed.   ED Course  Procedures (including critical care time)  Medications  albuterol (  PROVENTIL HFA;VENTOLIN HFA) 108 (90 BASE) MCG/ACT inhaler 2 puff (not administered)  aerochamber Z-Stat Plus/medium 1 each (not administered)  albuterol (PROVENTIL) (2.5 MG/3ML) 0.083% nebulizer solution 5 mg (5 mg Nebulization Given 10/03/15 0552)  ipratropium (ATROVENT) nebulizer solution 0.5 mg (0.5 mg Nebulization Given 10/03/15 0553)    Patient was given a nebulizer treatment for her rhonchi. Chest x-ray was done to look for pneumonia.  Recheck at 6:35 AM patient is resting comfortably. She states she feels like the nebulizers help. When I reexamined her lungs they are now clear. We discussed she most likely has a viral illness, she's been coughing for less than 2 days. She does not have a fever. She is not a smoker. Therefore she was not put on antibiotics. Shortly has cough syrup she can take. She was given an albuterol inhaler with spacer to use at home.  Labs Review Labs Reviewed - No data to display  Imaging Review Dg Chest Port 1 View  10/03/2015  CLINICAL DATA:  Acute onset of shortness of breath. Initial encounter. EXAM: PORTABLE CHEST 1 VIEW COMPARISON:  Chest radiograph performed 09/20/2014 FINDINGS: The lungs are well-aerated. Mild bibasilar atelectasis is noted. There is no evidence of pleural  effusion or pneumothorax. The cardiomediastinal silhouette is within normal limits. No acute osseous abnormalities are seen. Clips are seen overlying the right axilla. IMPRESSION: Mild bibasilar atelectasis noted.  Lungs otherwise clear. Electronically Signed   By: Garald Balding M.D.   On: 10/03/2015 06:07   I have personally reviewed and evaluated these images and lab results as part of my medical decision-making.   EKG Interpretation   Date/Time:  Wednesday October 03 2015 05:31:34 EDT Ventricular Rate:  71 PR Interval:  177 QRS Duration: 82 QT Interval:  405 QTC Calculation: 440 R Axis:   13 Text Interpretation:  Sinus rhythm Low voltage, precordial leads Abnormal  R-wave progression, early transition No significant change since last  tracing 12 Sep 2014 Confirmed by [facility]  MD-I, Jeremia Groot ([id number]) on 10/03/2015  6:02:41 AM      MDM   Final diagnoses:  Bronchitis with bronchospasm  Viral illness    albuterol (PROVENTIL HFA;VENTOLIN HFA) 108 (90 BASE) MCG/ACT inhaler 2 puff (not administered)  aerochamber Z-Stat Plus/medium 1 each (not administered)    Plan discharge  Rolland Porter, MD, Barbette Or, MD 10/03/15 [phone]

## 2015-10-03 NOTE — ED Notes (Signed)
Pt c/o cough, sob, and sore throat.

## 2015-10-18 DIAGNOSIS — E782 Mixed hyperlipidemia: Secondary | ICD-10-CM | POA: Diagnosis not present

## 2015-10-18 DIAGNOSIS — Z6821 Body mass index (BMI) 21.0-21.9, adult: Secondary | ICD-10-CM | POA: Diagnosis not present

## 2015-10-18 DIAGNOSIS — I4891 Unspecified atrial fibrillation: Secondary | ICD-10-CM | POA: Diagnosis not present

## 2015-10-18 DIAGNOSIS — N182 Chronic kidney disease, stage 2 (mild): Secondary | ICD-10-CM | POA: Diagnosis not present

## 2015-10-18 DIAGNOSIS — F329 Major depressive disorder, single episode, unspecified: Secondary | ICD-10-CM | POA: Diagnosis not present

## 2015-10-18 DIAGNOSIS — Z1389 Encounter for screening for other disorder: Secondary | ICD-10-CM | POA: Diagnosis not present

## 2015-10-18 DIAGNOSIS — N644 Mastodynia: Secondary | ICD-10-CM | POA: Diagnosis not present

## 2015-10-18 DIAGNOSIS — M810 Age-related osteoporosis without current pathological fracture: Secondary | ICD-10-CM | POA: Diagnosis not present

## 2015-10-18 DIAGNOSIS — I10 Essential (primary) hypertension: Secondary | ICD-10-CM | POA: Diagnosis not present

## 2015-10-30 DIAGNOSIS — Z1389 Encounter for screening for other disorder: Secondary | ICD-10-CM | POA: Diagnosis not present

## 2015-10-30 DIAGNOSIS — Z6822 Body mass index (BMI) 22.0-22.9, adult: Secondary | ICD-10-CM | POA: Diagnosis not present

## 2015-10-30 DIAGNOSIS — N390 Urinary tract infection, site not specified: Secondary | ICD-10-CM | POA: Diagnosis not present

## 2015-10-30 DIAGNOSIS — I10 Essential (primary) hypertension: Secondary | ICD-10-CM | POA: Diagnosis not present

## 2015-10-30 DIAGNOSIS — Z23 Encounter for immunization: Secondary | ICD-10-CM | POA: Diagnosis not present

## 2015-10-30 DIAGNOSIS — I4891 Unspecified atrial fibrillation: Secondary | ICD-10-CM | POA: Diagnosis not present

## 2015-11-02 ENCOUNTER — Other Ambulatory Visit: Payer: Self-pay | Admitting: Family Medicine

## 2015-11-02 DIAGNOSIS — Z1231 Encounter for screening mammogram for malignant neoplasm of breast: Secondary | ICD-10-CM

## 2015-11-06 ENCOUNTER — Telehealth (INDEPENDENT_AMBULATORY_CARE_PROVIDER_SITE_OTHER): Payer: Self-pay | Admitting: *Deleted

## 2015-11-06 NOTE — Telephone Encounter (Signed)
Dot is having a little problem. Diarrhea, nausea, vomiting, weak and abd pain. The return phone number is [phone].

## 2015-11-07 ENCOUNTER — Telehealth (INDEPENDENT_AMBULATORY_CARE_PROVIDER_SITE_OTHER): Payer: Self-pay | Admitting: Internal Medicine

## 2015-11-07 DIAGNOSIS — R110 Nausea: Secondary | ICD-10-CM | POA: Diagnosis not present

## 2015-11-07 LAB — CBC WITH DIFFERENTIAL/PLATELET
Basophils Absolute: 0 10*3/uL (ref 0.0–0.1)
Basophils Relative: 0 % (ref 0–1)
Eosinophils Absolute: 0.4 10*3/uL (ref 0.0–0.7)
Eosinophils Relative: 6 % — ABNORMAL HIGH (ref 0–5)
HEMATOCRIT: 30.1 % — AB (ref 36.0–46.0)
HEMOGLOBIN: 9.9 g/dL — AB (ref 12.0–15.0)
LYMPHS PCT: 27 % (ref 12–46)
Lymphs Abs: 1.9 10*3/uL (ref 0.7–4.0)
MCH: 29.3 pg (ref 26.0–34.0)
MCHC: 32.9 g/dL (ref 30.0–36.0)
MCV: 89.1 fL (ref 78.0–100.0)
MONO ABS: 0.8 10*3/uL (ref 0.1–1.0)
MPV: 10.4 fL (ref 8.6–12.4)
Monocytes Relative: 11 % (ref 3–12)
NEUTROS ABS: 4 10*3/uL (ref 1.7–7.7)
Neutrophils Relative %: 56 % (ref 43–77)
Platelets: 175 10*3/uL (ref 150–400)
RBC: 3.38 MIL/uL — AB (ref 3.87–5.11)
RDW: 13.8 % (ref 11.5–15.5)
WBC: 7.2 10*3/uL (ref 4.0–10.5)

## 2015-11-07 NOTE — Telephone Encounter (Signed)
No diarrhea. Vomited 2 days ago. She says she feels better today. No fever. No energy. I am going to get a CBC to be sure it is viral.

## 2015-11-07 NOTE — Telephone Encounter (Signed)
CBC today.  

## 2015-11-16 ENCOUNTER — Telehealth (INDEPENDENT_AMBULATORY_CARE_PROVIDER_SITE_OTHER): Payer: Self-pay | Admitting: *Deleted

## 2015-11-16 ENCOUNTER — Encounter (INDEPENDENT_AMBULATORY_CARE_PROVIDER_SITE_OTHER): Payer: Self-pay | Admitting: *Deleted

## 2015-11-16 DIAGNOSIS — D509 Iron deficiency anemia, unspecified: Secondary | ICD-10-CM

## 2015-11-16 NOTE — Telephone Encounter (Signed)
.  Per Terri Setzer,NP patient is to have lab work in 4 weeks  

## 2015-11-30 DIAGNOSIS — J029 Acute pharyngitis, unspecified: Secondary | ICD-10-CM | POA: Diagnosis not present

## 2015-11-30 DIAGNOSIS — M79671 Pain in right foot: Secondary | ICD-10-CM | POA: Diagnosis not present

## 2015-12-27 DIAGNOSIS — D509 Iron deficiency anemia, unspecified: Secondary | ICD-10-CM | POA: Diagnosis not present

## 2015-12-27 LAB — CBC
HCT: 34.7 % — ABNORMAL LOW (ref 36.0–46.0)
HEMOGLOBIN: 11.3 g/dL — AB (ref 12.0–15.0)
MCH: 29.1 pg (ref 26.0–34.0)
MCHC: 32.6 g/dL (ref 30.0–36.0)
MCV: 89.4 fL (ref 78.0–100.0)
MPV: 9.9 fL (ref 8.6–12.4)
PLATELETS: 227 10*3/uL (ref 150–400)
RBC: 3.88 MIL/uL (ref 3.87–5.11)
RDW: 13.6 % (ref 11.5–15.5)
WBC: 9.1 10*3/uL (ref 4.0–10.5)

## 2015-12-31 ENCOUNTER — Telehealth (INDEPENDENT_AMBULATORY_CARE_PROVIDER_SITE_OTHER): Payer: Self-pay | Admitting: *Deleted

## 2015-12-31 DIAGNOSIS — D509 Iron deficiency anemia, unspecified: Secondary | ICD-10-CM

## 2015-12-31 NOTE — Telephone Encounter (Signed)
.  Per Lelon Perla the patient is to have lab work in 3 months. Noted for 03/30/16.

## 2016-01-16 DIAGNOSIS — E063 Autoimmune thyroiditis: Secondary | ICD-10-CM | POA: Diagnosis not present

## 2016-01-16 DIAGNOSIS — E782 Mixed hyperlipidemia: Secondary | ICD-10-CM | POA: Diagnosis not present

## 2016-01-16 DIAGNOSIS — F329 Major depressive disorder, single episode, unspecified: Secondary | ICD-10-CM | POA: Diagnosis not present

## 2016-01-16 DIAGNOSIS — N182 Chronic kidney disease, stage 2 (mild): Secondary | ICD-10-CM | POA: Diagnosis not present

## 2016-01-16 DIAGNOSIS — I4891 Unspecified atrial fibrillation: Secondary | ICD-10-CM | POA: Diagnosis not present

## 2016-01-16 DIAGNOSIS — D649 Anemia, unspecified: Secondary | ICD-10-CM | POA: Diagnosis not present

## 2016-01-16 DIAGNOSIS — Z1389 Encounter for screening for other disorder: Secondary | ICD-10-CM | POA: Diagnosis not present

## 2016-01-16 DIAGNOSIS — E559 Vitamin D deficiency, unspecified: Secondary | ICD-10-CM | POA: Diagnosis not present

## 2016-02-21 DIAGNOSIS — Z1389 Encounter for screening for other disorder: Secondary | ICD-10-CM | POA: Diagnosis not present

## 2016-02-21 DIAGNOSIS — N644 Mastodynia: Secondary | ICD-10-CM | POA: Diagnosis not present

## 2016-02-21 DIAGNOSIS — Z6822 Body mass index (BMI) 22.0-22.9, adult: Secondary | ICD-10-CM | POA: Diagnosis not present

## 2016-03-10 ENCOUNTER — Other Ambulatory Visit (HOSPITAL_COMMUNITY): Payer: Self-pay | Admitting: Family Medicine

## 2016-03-10 ENCOUNTER — Other Ambulatory Visit: Payer: Self-pay | Admitting: Family Medicine

## 2016-03-10 DIAGNOSIS — M810 Age-related osteoporosis without current pathological fracture: Secondary | ICD-10-CM

## 2016-03-10 DIAGNOSIS — Z780 Asymptomatic menopausal state: Secondary | ICD-10-CM

## 2016-03-10 DIAGNOSIS — N644 Mastodynia: Secondary | ICD-10-CM

## 2016-03-11 ENCOUNTER — Other Ambulatory Visit (HOSPITAL_COMMUNITY): Payer: Self-pay | Admitting: Family Medicine

## 2016-03-11 DIAGNOSIS — N644 Mastodynia: Secondary | ICD-10-CM

## 2016-03-12 ENCOUNTER — Other Ambulatory Visit (INDEPENDENT_AMBULATORY_CARE_PROVIDER_SITE_OTHER): Payer: Self-pay | Admitting: *Deleted

## 2016-03-12 ENCOUNTER — Encounter (INDEPENDENT_AMBULATORY_CARE_PROVIDER_SITE_OTHER): Payer: Self-pay | Admitting: *Deleted

## 2016-03-12 ENCOUNTER — Encounter (HOSPITAL_COMMUNITY): Payer: Self-pay | Admitting: *Deleted

## 2016-03-12 ENCOUNTER — Emergency Department (HOSPITAL_COMMUNITY)
Admission: EM | Admit: 2016-03-12 | Discharge: 2016-03-12 | Disposition: A | Discharge status: Home / Self Care | Payer: Medicare Other | Attending: Emergency Medicine | Admitting: Emergency Medicine

## 2016-03-12 ENCOUNTER — Emergency Department (HOSPITAL_COMMUNITY): Payer: Federal, State, Local not specified - PPO

## 2016-03-12 ENCOUNTER — Emergency Department (HOSPITAL_COMMUNITY): Payer: Medicare Other

## 2016-03-12 DIAGNOSIS — I4891 Unspecified atrial fibrillation: Secondary | ICD-10-CM | POA: Diagnosis not present

## 2016-03-12 DIAGNOSIS — Z79899 Other long term (current) drug therapy: Secondary | ICD-10-CM | POA: Diagnosis not present

## 2016-03-12 DIAGNOSIS — E039 Hypothyroidism, unspecified: Secondary | ICD-10-CM | POA: Insufficient documentation

## 2016-03-12 DIAGNOSIS — Z853 Personal history of malignant neoplasm of breast: Secondary | ICD-10-CM | POA: Diagnosis not present

## 2016-03-12 DIAGNOSIS — R600 Localized edema: Secondary | ICD-10-CM | POA: Diagnosis not present

## 2016-03-12 DIAGNOSIS — I10 Essential (primary) hypertension: Secondary | ICD-10-CM | POA: Insufficient documentation

## 2016-03-12 DIAGNOSIS — M1990 Unspecified osteoarthritis, unspecified site: Secondary | ICD-10-CM | POA: Diagnosis not present

## 2016-03-12 DIAGNOSIS — Z87891 Personal history of nicotine dependence: Secondary | ICD-10-CM | POA: Diagnosis not present

## 2016-03-12 DIAGNOSIS — M7989 Other specified soft tissue disorders: Secondary | ICD-10-CM | POA: Diagnosis present

## 2016-03-12 DIAGNOSIS — R52 Pain, unspecified: Secondary | ICD-10-CM

## 2016-03-12 DIAGNOSIS — D509 Iron deficiency anemia, unspecified: Secondary | ICD-10-CM

## 2016-03-12 LAB — BASIC METABOLIC PANEL
Anion gap: 9 (ref 5–15)
BUN: 24 mg/dL — ABNORMAL HIGH (ref 6–20)
CALCIUM: 8.7 mg/dL — AB (ref 8.9–10.3)
CHLORIDE: 110 mmol/L (ref 101–111)
CO2: 24 mmol/L (ref 22–32)
CREATININE: 0.97 mg/dL (ref 0.44–1.00)
GFR calc Af Amer: 59 mL/min — ABNORMAL LOW (ref >60)
GFR calc non Af Amer: 51 mL/min — ABNORMAL LOW (ref >60)
GLUCOSE: 97 mg/dL (ref 65–99)
Potassium: 3.2 mmol/L — ABNORMAL LOW (ref 3.5–5.1)
Sodium: 143 mmol/L (ref 135–145)

## 2016-03-12 LAB — CBC WITH DIFFERENTIAL/PLATELET
BASOS PCT: 1 %
Basophils Absolute: 0 10*3/uL (ref 0.0–0.1)
EOS ABS: 0.5 10*3/uL (ref 0.0–0.7)
Eosinophils Relative: 9 %
HEMATOCRIT: 33.6 % — AB (ref 36.0–46.0)
HEMOGLOBIN: 11.1 g/dL — AB (ref 12.0–15.0)
LYMPHS ABS: 1.8 10*3/uL (ref 0.7–4.0)
Lymphocytes Relative: 30 %
MCH: 29 pg (ref 26.0–34.0)
MCHC: 33 g/dL (ref 30.0–36.0)
MCV: 87.7 fL (ref 78.0–100.0)
MONO ABS: 0.5 10*3/uL (ref 0.1–1.0)
Monocytes Relative: 9 %
Neutro Abs: 3.1 10*3/uL (ref 1.7–7.7)
Neutrophils Relative %: 51 %
Platelets: 174 10*3/uL (ref 150–400)
RBC: 3.83 MIL/uL — ABNORMAL LOW (ref 3.87–5.11)
RDW: 13.7 % (ref 11.5–15.5)
WBC: 5.9 10*3/uL (ref 4.0–10.5)

## 2016-03-12 MED ORDER — HYDROCHLOROTHIAZIDE 12.5 MG PO CAPS: ORAL_CAPSULE | ORAL | Status: DC

## 2016-03-12 NOTE — ED Provider Notes (Signed)
CSN: [id number]     Arrival date & time 03/12/16  1522 History   First MD Initiated Contact with Patient 03/12/16 1614     Chief Complaint  Patient presents with  . Leg Swelling     (Consider location/radiation/quality/duration/timing/severity/associated sxs/prior Treatment) HPI Comments: Patient is an 82 year old female with history of hypertension, hypothyroidism, and presumed atrial fibrillation. She presents for evaluation of bilateral lower leg swelling. This is been ongoing for the past 3 days. She reports her ankles are somewhat uncomfortable, but denies any calf pain, chest pain, or shortness of breath. She went to her doctor's office today and was referred here to have ultrasounds of her legs to rule out blood clots.  The history is provided by the patient.    Past Medical History  Diagnosis Date  . Hypertension   . Hypothyroid   . Abdominal adhesions 1994  . Atrial fibrillation ([place]) 10/2012    Associated with severe anemia and esophageal pill impaction  . Gastroesophageal reflux disease     Hiatal hernia  . Anxiety and depression   . Cholelithiasis     asymptomatic  . Upper GI bleed 2004    Multiple episodes of melena-? due to gastritis or adverse drug effect (nonsteroidals, small bowel ulceration with Fosamax); caused by Pepto-Bismol during one Emergency Department evaluation  . Malabsorption     Short gut syndrome following small bowel resection surgery x2  . Breast carcinoma ([place])     right mastectomy "25+ years ago"  . Anemia   . Arthritis   . Low back pain   . Allergic rhinitis   . Nephrolithiasis 2004    painless hematuria   Past Surgical History  Procedure Laterality Date  . Abdominal hysterectomy      emergency s/p delivery  . Bowel resection      Resulting short gut syndrome  . Colonoscopy w/ polypectomy  2005    Lipoma; diverticulosis  . Upper gastrointestinal endoscopy    . Mastectomy  right breast  . Colonoscopy with esophagogastroduodenoscopy  (egd)  11/22/2012    [facility]  . Mastectomy      Carcinoma of the breast; right  . Abdominal hysterectomy  1960    massive gynecologic bleeding  . Laparoscopic lysis of adhesions  1965    s/p adhesions   Family History  Problem Relation Age of Onset  . Anuerysm Father   . Rheum arthritis Sister   . Healthy Sister   . COPD Sister   . Healthy Brother   . Cancer    . Colon cancer Neg Hx    Social History  Substance Use Topics  . Smoking status: Former Smoker -- 1.50 packs/day for 20 years    Types: Cigarettes  . Smokeless tobacco: Never Used  . Alcohol Use: No   OB History    No data available     Review of Systems  All other systems reviewed and are negative.     Allergies  Codeine  Home Medications   Prior to Admission medications   Medication Sig Start Date End Date Taking? Authorizing Provider  ALPRAZolam Duanne Moron) 1 MG tablet Take 1 mg by mouth at bedtime as needed for sleep.    Historical Provider, MD  Calcium Carbonate-Vitamin D (CALCIUM + D PO) Take 1 tablet by mouth daily.    Historical Provider, MD  Cyanocobalamin (VITAMIN B-12 IJ) Inject as directed every 30 (thirty) days.    Historical Provider, MD  DULoxetine (CYMBALTA) 60 MG capsule Take 60 mg  by mouth every morning.     Historical Provider, MD  folic acid (FOLVITE) 1 MG tablet Take 1 mg by mouth daily.    Historical Provider, MD  glucosamine-chondroitin 500-400 MG tablet Take 1 tablet by mouth every morning.     Historical Provider, MD  iron polysaccharides (NIFEREX) 150 MG capsule Take 1 capsule (150 mg total) by mouth every Monday, Wednesday, and Friday. Patient not taking: Reported on 06/28/2015 06/27/13   Rogene Houston, MD  Lactobacillus (ACIDOPHILUS) 100 MG CAPS Take 100 mg by mouth every morning.     Historical Provider, MD  levothyroxine (SYNTHROID, LEVOTHROID) 50 MCG tablet Take 50 mcg by mouth every morning.    Historical Provider, MD  Multiple Vitamin (MULTIVITAMIN) tablet Take 1 tablet by mouth  every morning.     Historical Provider, MD  Omega-3 Fatty Acids (FISH OIL) 1000 MG CAPS Take 1,000 mg by mouth every morning.     Historical Provider, MD  pantoprazole (PROTONIX) 40 MG tablet Take 40 mg by mouth daily. 10/02/13   Historical Provider, MD  polyethylene glycol powder (MIRALAX) powder Take 255 g by mouth once. Patient taking differently: Take 1 Container by mouth as needed.  09/21/14   Kathie Dike, MD   BP 170/89 mmHg  Pulse 84  Temp(Src) 98.6 F (37 C) (Temporal)  Resp 16  Ht 5\' 4"  (1.626 m)  Wt 122 lb (55.339 kg)  BMI 20.93 kg/m2  SpO2 99% Physical Exam  Constitutional: She is oriented to person, place, and time. She appears well-developed and well-nourished. No distress.  HENT:  Head: Normocephalic and atraumatic.  Neck: Normal range of motion. Neck supple.  Cardiovascular: Normal rate and regular rhythm.  Exam reveals no gallop and no friction rub.   No murmur heard. Pulmonary/Chest: Effort normal and breath sounds normal. No respiratory distress. She has no wheezes.  Abdominal: Soft. Bowel sounds are normal. She exhibits no distension. There is no tenderness.  Musculoskeletal: Normal range of motion. She exhibits edema.  There is trace to 1+ pitting edema of the bilateral ankles. Pulses are easily palpable. There is no calf tenderness or swelling. Homans sign is absent bilaterally.  Neurological: She is alert and oriented to person, place, and time.  Skin: Skin is warm and dry. She is not diaphoretic.  Nursing note and vitals reviewed.   ED Course  Procedures (including critical care time) Labs Review Labs Reviewed  BASIC METABOLIC PANEL  CBC WITH DIFFERENTIAL/PLATELET    Imaging Review No results found. I have personally reviewed and evaluated these images and lab results as part of my medical decision-making.   EKG Interpretation None      MDM   Final diagnoses:  None    Patient presents with lower extremity edema. She was sent by her  primary Dr. to rule out blood clots. Ultrasound is negative. Laboratory studies revealed no significant abnormality. Her renal function is normal. I see no indication for further workup. She will be discharged with elevation of the feet, and a prescription for hydrochlorothiazide which she can take as needed for continued swelling.    Veryl Speak, MD 03/12/16 [phone]

## 2016-03-12 NOTE — ED Notes (Signed)
Pt sent by Dr. Nancy Nordmann. Pt c/o leg swelling x couple of weeks. Denies chest pain or shortness of breath.

## 2016-03-12 NOTE — Discharge Instructions (Signed)
Hydrochlorothiazide: Take 1 tablet up to 2 times daily as needed for swelling.  Keep your feet elevated as often as possible.  Return to the emergency department if you develop difficulty breathing, chest pain, or other new and concerning symptoms.   Edema Edema is an abnormal buildup of fluids in your bodytissues. Edema is somewhatdependent on gravity to pull the fluid to the lowest place in your body. That makes the condition more common in the legs and thighs (lower extremities). Painless swelling of the feet and ankles is common and becomes more likely as you get older. It is also common in looser tissues, like around your eyes.  When the affected area is squeezed, the fluid may move out of that spot and leave a dent for a few moments. This dent is called pitting.  CAUSES  There are many possible causes of edema. Eating too much salt and being on your feet or sitting for a long time can cause edema in your legs and ankles. Hot weather may make edema worse. Common medical causes of edema include:  Heart failure.  Liver disease.  Kidney disease.  Weak blood vessels in your legs.  Cancer.  An injury.  Pregnancy.  Some medications.  Obesity. SYMPTOMS  Edema is usually painless.Your skin may look swollen or shiny.  DIAGNOSIS  Your health care provider may be able to diagnose edema by asking about your medical history and doing a physical exam. You may need to have tests such as X-rays, an electrocardiogram, or blood tests to check for medical conditions that may cause edema.  TREATMENT  Edema treatment depends on the cause. If you have heart, liver, or kidney disease, you need the treatment appropriate for these conditions. General treatment may include:  Elevation of the affected body part above the level of your heart.  Compression of the affected body part. Pressure from elastic bandages or support stockings squeezes the tissues and forces fluid back into the blood  vessels. This keeps fluid from entering the tissues.  Restriction of fluid and salt intake.  Use of a water pill (diuretic). These medications are appropriate only for some types of edema. They pull fluid out of your body and make you urinate more often. This gets rid of fluid and reduces swelling, but diuretics can have side effects. Only use diuretics as directed by your health care provider. HOME CARE INSTRUCTIONS   Keep the affected body part above the level of your heart when you are lying down.   Do not sit still or stand for prolonged periods.   Do not put anything directly under your knees when lying down.  Do not wear constricting clothing or garters on your upper legs.   Exercise your legs to work the fluid back into your blood vessels. This may help the swelling go down.   Wear elastic bandages or support stockings to reduce ankle swelling as directed by your health care provider.   Eat a low-salt diet to reduce fluid if your health care provider recommends it.   Only take medicines as directed by your health care provider. SEEK MEDICAL CARE IF:   Your edema is not responding to treatment.  You have heart, liver, or kidney disease and notice symptoms of edema.  You have edema in your legs that does not improve after elevating them.   You have sudden and unexplained weight gain. SEEK IMMEDIATE MEDICAL CARE IF:   You develop shortness of breath or chest pain.   You cannot  breathe when you lie down.  You develop pain, redness, or warmth in the swollen areas.   You have heart, liver, or kidney disease and suddenly get edema.  You have a fever and your symptoms suddenly get worse. MAKE SURE YOU:   Understand these instructions.  Will watch your condition.  Will get help right away if you are not doing well or get worse.   This information is not intended to replace advice given to you by your health care provider. Make sure you discuss any questions  you have with your health care provider.   Document Released: 12/01/2005 Document Revised: 12/22/2014 Document Reviewed: 09/23/2013 Elsevier Interactive Patient Education [employer].

## 2016-03-18 ENCOUNTER — Ambulatory Visit (HOSPITAL_COMMUNITY)
Admission: RE | Admit: 2016-03-18 | Discharge: 2016-03-18 | Disposition: A | Discharge status: Home / Self Care | Payer: Medicare Other | Source: Ambulatory Visit | Attending: Family Medicine | Admitting: Family Medicine

## 2016-03-18 DIAGNOSIS — N644 Mastodynia: Secondary | ICD-10-CM | POA: Diagnosis not present

## 2016-03-18 DIAGNOSIS — M8580 Other specified disorders of bone density and structure, unspecified site: Secondary | ICD-10-CM | POA: Insufficient documentation

## 2016-03-18 DIAGNOSIS — Z780 Asymptomatic menopausal state: Secondary | ICD-10-CM | POA: Insufficient documentation

## 2016-03-18 DIAGNOSIS — M810 Age-related osteoporosis without current pathological fracture: Secondary | ICD-10-CM | POA: Insufficient documentation

## 2016-03-18 DIAGNOSIS — R928 Other abnormal and inconclusive findings on diagnostic imaging of breast: Secondary | ICD-10-CM | POA: Diagnosis not present

## 2016-03-18 DIAGNOSIS — N6489 Other specified disorders of breast: Secondary | ICD-10-CM | POA: Diagnosis not present

## 2016-03-31 DIAGNOSIS — D509 Iron deficiency anemia, unspecified: Secondary | ICD-10-CM | POA: Diagnosis not present

## 2016-04-01 LAB — CBC
HCT: 36.7 % (ref 35.0–45.0)
Hemoglobin: 11.8 g/dL (ref 11.7–15.5)
MCH: 28.2 pg (ref 27.0–33.0)
MCHC: 32.2 g/dL (ref 32.0–36.0)
MCV: 87.8 fL (ref 80.0–100.0)
MPV: 10.7 fL (ref 7.5–12.5)
PLATELETS: 206 10*3/uL (ref 140–400)
RBC: 4.18 MIL/uL (ref 3.80–5.10)
RDW: 14.3 % (ref 11.0–15.0)
WBC: 7.1 10*3/uL (ref 3.8–10.8)

## 2016-04-11 ENCOUNTER — Emergency Department (HOSPITAL_COMMUNITY): Payer: Medicare Other

## 2016-04-11 ENCOUNTER — Encounter (INDEPENDENT_AMBULATORY_CARE_PROVIDER_SITE_OTHER): Payer: Self-pay | Admitting: *Deleted

## 2016-04-11 ENCOUNTER — Encounter (HOSPITAL_COMMUNITY): Payer: Self-pay | Admitting: Emergency Medicine

## 2016-04-11 ENCOUNTER — Observation Stay (HOSPITAL_COMMUNITY)
Admission: EM | Admit: 2016-04-11 | Discharge: 2016-04-13 | Disposition: A | Discharge status: Home / Self Care | Payer: Medicare Other | Attending: Internal Medicine | Admitting: Internal Medicine

## 2016-04-11 DIAGNOSIS — E039 Hypothyroidism, unspecified: Secondary | ICD-10-CM | POA: Diagnosis present

## 2016-04-11 DIAGNOSIS — Z87891 Personal history of nicotine dependence: Secondary | ICD-10-CM | POA: Insufficient documentation

## 2016-04-11 DIAGNOSIS — Z79899 Other long term (current) drug therapy: Secondary | ICD-10-CM | POA: Diagnosis not present

## 2016-04-11 DIAGNOSIS — R1084 Generalized abdominal pain: Secondary | ICD-10-CM | POA: Diagnosis present

## 2016-04-11 DIAGNOSIS — N200 Calculus of kidney: Secondary | ICD-10-CM | POA: Diagnosis not present

## 2016-04-11 DIAGNOSIS — I10 Essential (primary) hypertension: Secondary | ICD-10-CM | POA: Diagnosis not present

## 2016-04-11 DIAGNOSIS — M1990 Unspecified osteoarthritis, unspecified site: Secondary | ICD-10-CM | POA: Insufficient documentation

## 2016-04-11 DIAGNOSIS — F329 Major depressive disorder, single episode, unspecified: Secondary | ICD-10-CM | POA: Insufficient documentation

## 2016-04-11 DIAGNOSIS — Z853 Personal history of malignant neoplasm of breast: Secondary | ICD-10-CM | POA: Diagnosis not present

## 2016-04-11 DIAGNOSIS — I4891 Unspecified atrial fibrillation: Secondary | ICD-10-CM | POA: Diagnosis not present

## 2016-04-11 DIAGNOSIS — N184 Chronic kidney disease, stage 4 (severe): Secondary | ICD-10-CM | POA: Diagnosis present

## 2016-04-11 DIAGNOSIS — R55 Syncope and collapse: Secondary | ICD-10-CM | POA: Diagnosis not present

## 2016-04-11 DIAGNOSIS — K5900 Constipation, unspecified: Principal | ICD-10-CM | POA: Diagnosis present

## 2016-04-11 DIAGNOSIS — N183 Chronic kidney disease, stage 3 unspecified: Secondary | ICD-10-CM | POA: Diagnosis present

## 2016-04-11 LAB — LIPASE, BLOOD: Lipase: 32 U/L (ref 11–51)

## 2016-04-11 LAB — COMPREHENSIVE METABOLIC PANEL
ALBUMIN: 4.1 g/dL (ref 3.5–5.0)
ALT: 15 U/L (ref 14–54)
ANION GAP: 11 (ref 5–15)
AST: 16 U/L (ref 15–41)
Alkaline Phosphatase: 55 U/L (ref 38–126)
BUN: 18 mg/dL (ref 6–20)
CALCIUM: 9.2 mg/dL (ref 8.9–10.3)
CO2: 22 mmol/L (ref 22–32)
Chloride: 109 mmol/L (ref 101–111)
Creatinine, Ser: 0.98 mg/dL (ref 0.44–1.00)
GFR calc non Af Amer: 50 mL/min — ABNORMAL LOW (ref >60)
GFR, EST AFRICAN AMERICAN: 58 mL/min — AB (ref >60)
GLUCOSE: 96 mg/dL (ref 65–99)
POTASSIUM: 3.5 mmol/L (ref 3.5–5.1)
Sodium: 142 mmol/L (ref 135–145)
TOTAL PROTEIN: 7 g/dL (ref 6.5–8.1)
Total Bilirubin: 0.7 mg/dL (ref 0.3–1.2)

## 2016-04-11 LAB — CBC
HEMATOCRIT: 36.7 % (ref 36.0–46.0)
HEMOGLOBIN: 12.1 g/dL (ref 12.0–15.0)
MCH: 28.9 pg (ref 26.0–34.0)
MCHC: 33 g/dL (ref 30.0–36.0)
MCV: 87.8 fL (ref 78.0–100.0)
Platelets: 181 10*3/uL (ref 150–400)
RBC: 4.18 MIL/uL (ref 3.87–5.11)
RDW: 13.9 % (ref 11.5–15.5)
WBC: 9.8 10*3/uL (ref 4.0–10.5)

## 2016-04-11 MED ORDER — DIATRIZOATE MEGLUMINE & SODIUM 66-10 % PO SOLN
ORAL | Status: AC
  Administered 2016-04-12: 30 mL
  Filled 2016-04-11: qty 30

## 2016-04-11 MED ORDER — IOPAMIDOL (ISOVUE-300) INJECTION 61%
INTRAVENOUS | Status: AC
  Administered 2016-04-12: 100 mL
  Filled 2016-04-11: qty 100

## 2016-04-11 MED ORDER — SODIUM CHLORIDE 0.9 % IV BOLUS (SEPSIS)
1000.0000 mL | Freq: Once | INTRAVENOUS | Status: AC
  Administered 2016-04-11: 1000 mL via INTRAVENOUS

## 2016-04-11 MED ORDER — FENTANYL CITRATE (PF) 100 MCG/2ML IJ SOLN
50.0000 ug | Freq: Once | INTRAMUSCULAR | Status: AC
  Administered 2016-04-11: 50 ug via INTRAVENOUS
  Filled 2016-04-11: qty 2

## 2016-04-11 MED ORDER — ONDANSETRON HCL 4 MG/2ML IJ SOLN
4.0000 mg | Freq: Once | INTRAMUSCULAR | Status: AC
  Administered 2016-04-11: 4 mg via INTRAVENOUS
  Filled 2016-04-11: qty 2

## 2016-04-11 MED ORDER — SODIUM CHLORIDE 0.9 % IV BOLUS (SEPSIS)
500.0000 mL | Freq: Once | INTRAVENOUS | Status: AC
  Administered 2016-04-12: 500 mL via INTRAVENOUS

## 2016-04-11 NOTE — ED Notes (Signed)
Patient complaining of abdominal pain since 0300 this morning. Denies vomiting or diarrhea.

## 2016-04-11 NOTE — ED Provider Notes (Signed)
CSN: [id number]     Arrival date & time 04/11/16  2137 History  By signing my name below, I, Jane Andrews, attest that this documentation has been prepared under the direction and in the presence of physician practitioner, Jane Porter, MD at 23:04 PM. Electronically Signed: [name], Scribe. 04/11/2016. 11:06 PM.     Chief Complaint  Patient presents with  . Abdominal Pain    The history is provided by the patient. No language interpreter was used.     HPI Comments: Jane Andrews. [place] is a 82 y.o. female with h/o abdominal adhesions, nephrolithiasis and cholelithiasis who presents to the Emergency Department complaining of sudden onset, intermittent, cramping, mid-abdominal pain  beginning around 3AM this morning that was lasting about 30 seconds. Pt states that she woke up due to the pain. She feels like she cannot pass gas and has not had a bowel movement since onset; she does not feel like she has to pass gas. She also endorses increased urinary frequency since onset. Pt has not had anything to eat or drink today due to pain exacerbation with consumption of foods/fluids. She states that her stomach feels bloated. Pt has experienced the same symptoms in the past; she has had multiple abdominal surgeries for abdominal adhesions; she has not had abdominal surgery since 1994. She states that she has experienced bowel blockages since her last surgery; her last bowel blockage was around a year and a half ago, and they have been treated medically.. Pt is not a smoker and does not drink ETOH. She lives alone. She ambulates without a walker or cane. She denies nausea, vomiting, fever, dysuria, or any other associated symptoms.   Gastroenterologist: Dr. Laural Andrews  Past Medical History  Diagnosis Date  . Hypertension   . Hypothyroid   . Abdominal adhesions 1994  . Atrial fibrillation ([place]) 10/2012    Associated with severe anemia and esophageal pill impaction  . Gastroesophageal reflux disease      Hiatal hernia  . Anxiety and depression   . Cholelithiasis     asymptomatic  . Upper GI bleed 2004    Multiple episodes of melena-? due to gastritis or adverse drug effect (nonsteroidals, small bowel ulceration with Fosamax); caused by Pepto-Bismol during one Emergency Department evaluation  . Malabsorption     Short gut syndrome following small bowel resection surgery x2  . Breast carcinoma ([place])     right mastectomy "25+ years ago"  . Anemia   . Arthritis   . Low back pain   . Allergic rhinitis   . Nephrolithiasis 2004    painless hematuria   Past Surgical History  Procedure Laterality Date  . Abdominal hysterectomy      emergency s/p delivery  . Bowel resection      Resulting short gut syndrome  . Colonoscopy w/ polypectomy  2005    Lipoma; diverticulosis  . Upper gastrointestinal endoscopy    . Mastectomy  right breast  . Colonoscopy with esophagogastroduodenoscopy (egd)  11/22/2012    [facility]  . Mastectomy      Carcinoma of the breast; right  . Abdominal hysterectomy  1960    massive gynecologic bleeding  . Laparoscopic lysis of adhesions  1965    s/p adhesions   Family History  Problem Relation Age of Onset  . Anuerysm Father   . Rheum arthritis Sister   . Healthy Sister   . COPD Sister   . Healthy Brother   . Cancer    . Colon cancer  Neg Hx    Social History  Substance Use Topics  . Smoking status: Former Smoker -- 1.50 packs/day for 20 years    Types: Cigarettes  . Smokeless tobacco: Never Used  . Alcohol Use: No   Lives at home  Lives alone  OB History    No data available     Review of Systems  Constitutional: Negative for fever.  Gastrointestinal: Positive for abdominal pain. Negative for nausea, vomiting and diarrhea.  Genitourinary: Positive for frequency. Negative for dysuria.  All other systems reviewed and are negative.   Allergies  Codeine  Home Medications   Prior to Admission medications   Medication Sig Start Date End Date  Taking? Authorizing Provider  ALPRAZolam Duanne Moron) 1 MG tablet Take 1 mg by mouth at bedtime as needed for sleep.    Historical Provider, MD  Calcium Carbonate-Vitamin D (CALCIUM + D PO) Take 1 tablet by mouth daily.    Historical Provider, MD  Cyanocobalamin (VITAMIN B-12 IJ) Inject as directed every 30 (thirty) days.    Historical Provider, MD  doxycycline (VIBRA-TABS) 100 MG tablet Take 100 mg by mouth daily. 30 day course starting on 02/19/2016 02/19/16   Historical Provider, MD  DULoxetine (CYMBALTA) 60 MG capsule Take 60 mg by mouth every morning.     Historical Provider, MD  folic acid (FOLVITE) 1 MG tablet Take 1 mg by mouth daily.    Historical Provider, MD  glucosamine-chondroitin 500-400 MG tablet Take 1 tablet by mouth every morning.     Historical Provider, MD  hydrochlorothiazide (MICROZIDE) 12.5 MG capsule Take 1 tablet twice daily as needed for swelling. 03/12/16   Veryl Speak, MD  iron polysaccharides (NIFEREX) 150 MG capsule Take 1 capsule (150 mg total) by mouth every Monday, Wednesday, and Friday. Patient taking differently: Take 150 mg by mouth daily.  06/27/13   Rogene Houston, MD  Lactobacillus (ACIDOPHILUS) 100 MG CAPS Take 100 mg by mouth every morning.     Historical Provider, MD  levothyroxine (SYNTHROID, LEVOTHROID) 75 MCG tablet Take 75 mcg by mouth daily before breakfast.    Historical Provider, MD  Multiple Vitamin (MULTIVITAMIN) tablet Take 1 tablet by mouth every morning.     Historical Provider, MD  Omega-3 Fatty Acids (FISH OIL) 1000 MG CAPS Take 1,000 mg by mouth every morning.     Historical Provider, MD  pantoprazole (PROTONIX) 40 MG tablet Take 40 mg by mouth daily. 12/20/15   Historical Provider, MD  Vitamin D, Ergocalciferol, (DRISDOL) 50000 units CAPS capsule Take 50,000 Units by mouth every 7 (seven) days.    Historical Provider, MD   BP 159/80 mmHg  Pulse 89  Temp(Src) 98.4 F (36.9 C) (Oral)  Resp 18  Ht 5\' 4"  (1.626 m)  Wt 122 lb (55.339 kg)  BMI 20.93  kg/m2  SpO2 98%  Vital signs normal   Physical Exam  Constitutional: She is oriented to person, place, and time. She appears well-developed and well-nourished.  Non-toxic appearance. She does not appear ill. No distress.  HENT:  Head: Normocephalic and atraumatic.  Right Ear: External ear normal.  Left Ear: External ear normal.  Nose: Nose normal. No mucosal edema or rhinorrhea.  Mouth/Throat: Oropharynx is clear and moist. Mucous membranes are dry. No dental abscesses or uvula swelling.  Eyes: Conjunctivae and EOM are normal. Pupils are equal, round, and reactive to light.  Neck: Normal range of motion and full passive range of motion without pain. Neck supple.  Cardiovascular: Normal rate, regular  rhythm and normal heart sounds.  Exam reveals no gallop and no friction rub.   No murmur heard. Pulmonary/Chest: Effort normal and breath sounds normal. No respiratory distress. She has no wheezes. She has no rhonchi. She has no rales. She exhibits no tenderness and no crepitus.  Abdominal: Soft. Normal appearance. She exhibits no distension. Bowel sounds are increased. There is no tenderness. There is no rebound and no guarding.  Increased high pitched bowel sounds diffusely.  Musculoskeletal: Normal range of motion. She exhibits no edema or tenderness.  Moves all extremities well.   Neurological: She is alert and oriented to person, place, and time. She has normal strength. No cranial nerve deficit.  Skin: Skin is warm, dry and intact. No rash noted. No erythema. No pallor.  Psychiatric: She has a normal mood and affect. Her speech is normal and behavior is normal. Her mood appears not anxious.  Nursing note and vitals reviewed.   ED Course  Procedures (including critical care time)  Medications  fentaNYL (SUBLIMAZE) injection 50 mcg (50 mcg Intravenous Given 04/11/16 2323)  ondansetron (ZOFRAN) injection 4 mg (4 mg Intravenous Given 04/11/16 2322)  sodium chloride 0.9 % bolus 1,000 mL  (0 mLs Intravenous Stopped 04/12/16 0207)  sodium chloride 0.9 % bolus 500 mL (500 mLs Intravenous New Bag/Given 04/12/16 0240)  iopamidol (ISOVUE-300) 61 % injection (100 mLs  Contrast Given 04/12/16 0044)  diatrizoate meglumine-sodium (GASTROGRAFIN) 66-10 % solution (30 mLs  Given 04/12/16 0044)  sodium phosphate (FLEET) 7-19 GM/118ML enema 1 enema (1 enema Rectal Given 04/12/16 0208)     DIAGNOSTIC STUDIES: Oxygen Saturation is 98% on RA, normal by my interpretation.    COORDINATION OF CARE: 11:06 PM Discussed treatment plan with pt at bedside and pt agreed to plan. Patient was given IV fluids, IV pain and nausea medication. CT scan was ordered to look for bowel obstruction.  Patient had CT scan done with oral and IV contrast. After reviewing her CT scan she was given a enema or her constipation.  2:40 AM nurses report patient was in the bathroom and had a very large bowel movement. After which she became pale, sweaty, stated she felt like she was going to pass out. Her blood pressure was normal and her heart rate was 108. Repeat CBG is 132  2:55 AM patient was discussed with Dr. Truman Hayward, hospital as he is going to admit for observation.  3:00 AM patient is still pale, however she states she's feeling better.    Labs Review Results for orders placed or performed during the hospital encounter of 04/11/16  Lipase, blood  Result Value Ref Range   Lipase 32 11 - 51 U/L  Comprehensive metabolic panel  Result Value Ref Range   Sodium 142 135 - 145 mmol/L   Potassium 3.5 3.5 - 5.1 mmol/L   Chloride 109 101 - 111 mmol/L   CO2 22 22 - 32 mmol/L   Glucose, Bld 96 65 - 99 mg/dL   BUN 18 6 - 20 mg/dL   Creatinine, Ser 0.98 0.44 - 1.00 mg/dL   Calcium 9.2 8.9 - 10.3 mg/dL   Total Protein 7.0 6.5 - 8.1 g/dL   Albumin 4.1 3.5 - 5.0 g/dL   AST 16 15 - 41 U/L   ALT 15 14 - 54 U/L   Alkaline Phosphatase 55 38 - 126 U/L   Total Bilirubin 0.7 0.3 - 1.2 mg/dL   GFR calc non Af Amer 50 (L) >60  mL/min   GFR calc  Af Amer 58 (L) >60 mL/min   Anion gap 11 5 - 15  CBC  Result Value Ref Range   WBC 9.8 4.0 - 10.5 K/uL   RBC 4.18 3.87 - 5.11 MIL/uL   Hemoglobin 12.1 12.0 - 15.0 g/dL   HCT 36.7 36.0 - 46.0 %   MCV 87.8 78.0 - 100.0 fL   MCH 28.9 26.0 - 34.0 pg   MCHC 33.0 30.0 - 36.0 g/dL   RDW 13.9 11.5 - 15.5 %   Platelets 181 150 - 400 K/uL  Urinalysis, Routine w reflex microscopic  Result Value Ref Range   Color, Urine YELLOW YELLOW   APPearance CLEAR CLEAR   Specific Gravity, Urine <1.005 (L) 1.005 - 1.030   pH 6.0 5.0 - 8.0   Glucose, UA NEGATIVE NEGATIVE mg/dL   Hgb urine dipstick TRACE (A) NEGATIVE   Bilirubin Urine NEGATIVE NEGATIVE   Ketones, ur TRACE (A) NEGATIVE mg/dL   Protein, ur NEGATIVE NEGATIVE mg/dL   Nitrite NEGATIVE NEGATIVE   Leukocytes, UA TRACE (A) NEGATIVE  Urine microscopic-add on  Result Value Ref Range   Squamous Epithelial / LPF 0-5 (A) NONE SEEN   WBC, UA 0-5 0 - 5 WBC/hpf   RBC / HPF 0-5 0 - 5 RBC/hpf   Bacteria, UA NONE SEEN NONE SEEN  CBG monitoring, ED  Result Value Ref Range   Glucose-Capillary 132 (H) 65 - 99 mg/dL    Laboratory interpretation all normal     Imaging Review Ct Abdomen Pelvis W Contrast  04/12/2016  CLINICAL DATA:  History of small bowel obstructions with recurrent symptoms. EXAM: CT ABDOMEN AND PELVIS WITH CONTRAST TECHNIQUE: Multidetector CT imaging of the abdomen and pelvis was performed using the standard protocol following bolus administration of intravenous contrast. CONTRAST:  160mL ISOVUE-300 IOPAMIDOL (ISOVUE-300) INJECTION 61% COMPARISON:  01/16/2015 FINDINGS: Lower chest and abdominal wall: Mastectomy and partly visualized right breast implant Hepatobiliary: Stable 1 cm cyst along the gallbladder fossa.Cholelithiasis. The gallbladder is full but there is no inflammatory changes. Normal common bile duct diameter. Pancreas: Stable appearance Spleen: Unremarkable. Adrenals/Urinary Tract: Negative adrenals.  Staghorn calculus casting the lower right urinary collecting system. Punctate calculi in the upper poles. Bilateral cortical cysts. No hydronephrosis or ureteral calculus Unremarkable bladder. Reproductive:Hysterectomy. Unchanged 29 mm right ovarian cyst, considered incidental given stability and patient's age. Stomach/Bowel: Enterocolonic anastomosis with chronic dilatation of bowel proximal to the anastomosis. This chronic dilatation may be secondary to prior obstruction or postoperative aneurysm. The anastomosis is widely patent with contrast reaching the ascending colon. No inflammatory or obstructive changes at the remnant terminal ileum. The appendix is not seen. No pericecal inflammation. Large volume formed stool throughout the colon. Vascular/Lymphatic: No acute vascular abnormality. No mass or adenopathy. Peritoneal: Small low right pelvic cyst remains stable and benign. This could be a peritoneal inclusion cyst, lymphangioma, or congenital duplication cyst. Musculoskeletal: Diffuse degenerative disc disease with levoscoliosis. No acute osseous finding. IMPRESSION: 1. Large volume stool suggesting constipation. No obstruction or impaction. 2. Cholelithiasis without cholecystitis. 3. Nephrolithiasis including staghorn calculus on the right. 4. Additional incidental findings remain stable Electronically Signed   By: Monte Fantasia M.D.   On: 04/12/2016 01:29   I have personally reviewed and evaluated these images and lab results as part of my medical decision-making.    MDM   Final diagnoses:  Generalized abdominal pain  Constipation, unspecified constipation type  Near syncope   Admit for observation   Jane Porter, MD, Abram Sander  Jane Porter, MD 04/12/16 [id number]

## 2016-04-12 ENCOUNTER — Encounter (HOSPITAL_COMMUNITY): Payer: Self-pay | Admitting: Internal Medicine

## 2016-04-12 DIAGNOSIS — I10 Essential (primary) hypertension: Secondary | ICD-10-CM | POA: Diagnosis not present

## 2016-04-12 DIAGNOSIS — N183 Chronic kidney disease, stage 3 (moderate): Secondary | ICD-10-CM

## 2016-04-12 DIAGNOSIS — K5900 Constipation, unspecified: Secondary | ICD-10-CM

## 2016-04-12 DIAGNOSIS — E039 Hypothyroidism, unspecified: Secondary | ICD-10-CM | POA: Diagnosis not present

## 2016-04-12 DIAGNOSIS — N200 Calculus of kidney: Secondary | ICD-10-CM | POA: Diagnosis not present

## 2016-04-12 DIAGNOSIS — R55 Syncope and collapse: Secondary | ICD-10-CM | POA: Diagnosis present

## 2016-04-12 LAB — URINALYSIS, ROUTINE W REFLEX MICROSCOPIC
Bilirubin Urine: NEGATIVE
GLUCOSE, UA: NEGATIVE mg/dL
NITRITE: NEGATIVE
PROTEIN: NEGATIVE mg/dL
Specific Gravity, Urine: <1.005 — ABNORMAL LOW (ref 1.005–1.030)
pH: 6 (ref 5.0–8.0)

## 2016-04-12 LAB — URINE MICROSCOPIC-ADD ON: Bacteria, UA: NONE SEEN

## 2016-04-12 LAB — TSH: TSH: 3.544 u[IU]/mL (ref 0.350–4.500)

## 2016-04-12 LAB — CBG MONITORING, ED: GLUCOSE-CAPILLARY: 132 mg/dL — AB (ref 65–99)

## 2016-04-12 MED ORDER — ALPRAZOLAM 1 MG PO TABS
1.0000 mg | ORAL_TABLET | Freq: Every evening | ORAL | Status: DC | PRN
  Administered 2016-04-12: 1 mg via ORAL
  Filled 2016-04-12: qty 1

## 2016-04-12 MED ORDER — CALCIUM CITRATE-VITAMIN D 315-200 MG-UNIT PO TABS: 1.0000 | ORAL_TABLET | Freq: Every day | ORAL | Status: DC

## 2016-04-12 MED ORDER — ACETAMINOPHEN 500 MG PO TABS
500.0000 mg | ORAL_TABLET | Freq: Four times a day (QID) | ORAL | Status: DC | PRN
  Administered 2016-04-12: 500 mg via ORAL
  Filled 2016-04-12: qty 1

## 2016-04-12 MED ORDER — RISAQUAD PO CAPS
1.0000 | ORAL_CAPSULE | Freq: Every day | ORAL | Status: DC
  Administered 2016-04-12: 1 via ORAL
  Filled 2016-04-12: qty 1

## 2016-04-12 MED ORDER — POLYSACCHARIDE IRON COMPLEX 150 MG PO CAPS
150.0000 mg | ORAL_CAPSULE | Freq: Every day | ORAL | Status: DC
  Administered 2016-04-12: 150 mg via ORAL
  Filled 2016-04-12: qty 1

## 2016-04-12 MED ORDER — DOCUSATE SODIUM 100 MG PO CAPS
200.0000 mg | ORAL_CAPSULE | Freq: Two times a day (BID) | ORAL | Status: DC
  Administered 2016-04-12: 200 mg via ORAL
  Filled 2016-04-12: qty 2

## 2016-04-12 MED ORDER — SODIUM CHLORIDE 0.9% FLUSH: 3.0000 mL | Freq: Two times a day (BID) | INTRAVENOUS | Status: DC

## 2016-04-12 MED ORDER — ADULT MULTIVITAMIN W/MINERALS CH
1.0000 | ORAL_TABLET | Freq: Every morning | ORAL | Status: DC
  Administered 2016-04-12: 1 via ORAL
  Filled 2016-04-12: qty 1

## 2016-04-12 MED ORDER — SODIUM CHLORIDE 0.9 % IV SOLN: INTRAVENOUS | Status: DC

## 2016-04-12 MED ORDER — HEPARIN SODIUM (PORCINE) 5000 UNIT/ML IJ SOLN
5000.0000 [IU] | Freq: Three times a day (TID) | INTRAMUSCULAR | Status: DC
  Administered 2016-04-12 – 2016-04-13 (×4): 5000 [IU] via SUBCUTANEOUS
  Filled 2016-04-12 (×4): qty 1

## 2016-04-12 MED ORDER — ACIDOPHILUS 100 MG PO CAPS
100.0000 mg | ORAL_CAPSULE | Freq: Every morning | ORAL | Status: DC
  Filled 2016-04-12 (×3): qty 1

## 2016-04-12 MED ORDER — SODIUM CHLORIDE 0.9 % IV SOLN
INTRAVENOUS | Status: AC
  Administered 2016-04-12: 12:00:00 via INTRAVENOUS

## 2016-04-12 MED ORDER — LEVOTHYROXINE SODIUM 75 MCG PO TABS
75.0000 ug | ORAL_TABLET | Freq: Every day | ORAL | Status: DC
  Administered 2016-04-12: 75 ug via ORAL
  Filled 2016-04-12: qty 1

## 2016-04-12 MED ORDER — CHOLECALCIFEROL 10 MCG (400 UNIT) PO TABS
200.0000 [IU] | ORAL_TABLET | Freq: Every day | ORAL | Status: DC
Start: 2016-04-12 — End: 2016-04-13
  Administered 2016-04-12: 200 [IU] via ORAL
  Filled 2016-04-12: qty 1

## 2016-04-12 MED ORDER — OMEGA-3-ACID ETHYL ESTERS 1 G PO CAPS
1.0000 g | ORAL_CAPSULE | Freq: Every day | ORAL | Status: DC
  Administered 2016-04-12: 1 g via ORAL
  Filled 2016-04-12: qty 1

## 2016-04-12 MED ORDER — GLUCOSAMINE-CHONDROITIN 500-400 MG PO TABS: 1.0000 | ORAL_TABLET | Freq: Every morning | ORAL | Status: DC

## 2016-04-12 MED ORDER — BISACODYL 10 MG RE SUPP: 10.0000 mg | Freq: Every day | RECTAL | Status: DC

## 2016-04-12 MED ORDER — CALCIUM CITRATE 950 (200 CA) MG PO TABS
200.0000 mg | ORAL_TABLET | Freq: Every day | ORAL | Status: DC
  Administered 2016-04-12: 200 mg via ORAL
  Filled 2016-04-12 (×3): qty 1

## 2016-04-12 MED ORDER — DULOXETINE HCL 60 MG PO CPEP
60.0000 mg | ORAL_CAPSULE | Freq: Every morning | ORAL | Status: DC
  Administered 2016-04-12: 60 mg via ORAL
  Filled 2016-04-12: qty 1

## 2016-04-12 MED ORDER — PANTOPRAZOLE SODIUM 40 MG PO TBEC
40.0000 mg | DELAYED_RELEASE_TABLET | Freq: Every day | ORAL | Status: DC
  Administered 2016-04-12: 40 mg via ORAL
  Filled 2016-04-12: qty 1

## 2016-04-12 MED ORDER — POLYETHYLENE GLYCOL 3350 17 G PO PACK
17.0000 g | PACK | Freq: Two times a day (BID) | ORAL | Status: DC
  Administered 2016-04-12: 17 g via ORAL
  Filled 2016-04-12: qty 1

## 2016-04-12 MED ORDER — POTASSIUM CHLORIDE CRYS ER 20 MEQ PO TBCR
40.0000 meq | EXTENDED_RELEASE_TABLET | Freq: Once | ORAL | Status: AC
  Administered 2016-04-12: 40 meq via ORAL
  Filled 2016-04-12: qty 2

## 2016-04-12 MED ORDER — FOLIC ACID 1 MG PO TABS
1.0000 mg | ORAL_TABLET | Freq: Every day | ORAL | Status: DC
  Administered 2016-04-12: 1 mg via ORAL
  Filled 2016-04-12: qty 1

## 2016-04-12 MED ORDER — FLEET ENEMA 7-19 GM/118ML RE ENEM
1.0000 | ENEMA | Freq: Once | RECTAL | Status: AC
  Administered 2016-04-12: 1 via RECTAL

## 2016-04-12 NOTE — Progress Notes (Signed)
Patient complains of back pain. No pain medication ordered. Dr. Candiss Norse notified. New orders for Tylenol 500mg  every 6 hours as needed.

## 2016-04-12 NOTE — Progress Notes (Signed)
PROGRESS NOTE                                                                                                                                                                                                             Patient Demographics:    Jane Andrews, is a 82 y.o. female, DOB - 12/23/1924, [id number]  Admit date - 04/11/2016   Admitting Physician Orvan Falconer, MD  Outpatient Primary MD for the patient is Jana Half  LOS -   Outpatient Specialists:   Chief Complaint  Patient presents with  . Abdominal Pain       Brief Narrative   Jane Andrews is a 82 y.o. female with medical history significant of HTN, CKD stage 3, hypothyroidism, a-fib, and breast carcinoma presented with Abdominal pain, in the ER CT scan of the abdomen showed large stool burden, she was provided with an enema, she had a good bowel movement however while using the bathroom she got lightheaded and admitted to the hospital. She was also found to be mildly orthostatic next day of the hospital stay. Currently feeling much better with hydration.   Subjective:    Jane Andrews today has, No headache, No chest pain, No abdominal pain - No Nausea, No new weakness tingling or numbness, No Cough - SOB.     Assessment  & Plan :    1. Presyncope. Due to combination of vasovagal episode while using the bathroom along with mild orthostatic hypotension. Continue IV fluids for another 12 hours, increase activity, ambulate in the hallway, PT eval, hold HCTZ. If stable discharge in the morning.  2. Large stool burden on CT scan. Better after an enema, place on bowel regimen.  3. Incidental findings of large right-sided staghorn calculi and gallstones. Asymptomatic follow with PCP.  4. Hypothyroidism. Continue home dose Synthroid. TSH stable at 3.5.  5.History of paroxysmal atrial fibrillation. Obtain baseline EKG, monitor on  telemetry, [place] vasc 2 score of greater than 4. Not on anticoagulation. We'll defer this to PCP and primary cardiologist. Not on any cardiac medications.  6. GERD. On PPI continue.  7. History of short gut syndrome, bowel adhesions in the past. Stable.    Code Status : Full  Family Communication  : None present  Disposition Plan  : Home in the morning  Barriers For Discharge : Orthostatic  hypotension  Consults  : None  Procedures  :   CT abdomen pelvis, large stool burden, staghorn calculi on the right, gallstones  DVT Prophylaxis  : Heparin    Lab Results  Component Value Date   PLT 181 04/11/2016    Antibiotics  :     Anti-infectives    None        Objective:   Filed Vitals:   04/12/16 0008 04/12/16 0300 04/12/16 0334 04/12/16 0427  BP: 169/70 141/63 124/52 149/75  Pulse: 77 88 88 91  Temp:    98.2 F (36.8 C)  TempSrc:    Oral  Resp: 16  18 18   Height:      Weight:    54.885 kg (121 lb)  SpO2: 96% 92% 95% 99%    Wt Readings from Last 3 Encounters:  04/12/16 54.885 kg (121 lb)  03/12/16 55.339 kg (122 lb)  10/03/15 56.7 kg (125 lb)     Intake/Output Summary (Last 24 hours) at 04/12/16 1030 Last data filed at 04/12/16 0356  Gross per 24 hour  Intake    500 ml  Output      0 ml  Net    500 ml     Physical Exam  Awake Alert, Oriented X 3, No new F.N deficits, Normal affect [place].AT,PERRAL Supple Neck,No JVD, No cervical lymphadenopathy appriciated.  Symmetrical Chest wall movement, Good air movement bilaterally, CTAB RRR,No Gallops,Rubs or new Murmurs, No Parasternal Heave +ve B.Sounds, Abd Soft, No tenderness, No organomegaly appriciated, No rebound - guarding or rigidity. No Cyanosis, Clubbing or edema, No new Rash or bruise       Data Review:    CBC  Recent Labs Lab 04/11/16 2252  WBC 9.8  HGB 12.1  HCT 36.7  PLT 181  MCV 87.8  MCH 28.9  MCHC 33.0  RDW 13.9    Chemistries   Recent Labs Lab 04/11/16 2252  NA 142  K  3.5  CL 109  CO2 22  GLUCOSE 96  BUN 18  CREATININE 0.98  CALCIUM 9.2  AST 16  ALT 15  ALKPHOS 55  BILITOT 0.7   ------------------------------------------------------------------------------------------------------------------ No results for input(s): CHOL, HDL, LDLCALC, TRIG, CHOLHDL, LDLDIRECT in the last 72 hours.  Lab Results  Component Value Date   HGBA1C 5.1 10/21/2012   ------------------------------------------------------------------------------------------------------------------  Recent Labs  04/11/16 2252  TSH 3.544   ------------------------------------------------------------------------------------------------------------------ No results for input(s): VITAMINB12, FOLATE, FERRITIN, TIBC, IRON, RETICCTPCT in the last 72 hours.  Coagulation profile No results for input(s): INR, PROTIME in the last 168 hours.  No results for input(s): DDIMER in the last 72 hours.  Cardiac Enzymes No results for input(s): CKMB, TROPONINI, MYOGLOBIN in the last 168 hours.  Invalid input(s): CK ------------------------------------------------------------------------------------------------------------------ No results found for: BNP  Inpatient Medications  Scheduled Meds: . acidophilus  1 capsule Oral Daily  . bisacodyl  10 mg Rectal Daily  . calcium citrate  200 mg of elemental calcium Oral Daily   And  . cholecalciferol  200 Units Oral Daily  . docusate sodium  200 mg Oral BID  . DULoxetine  60 mg Oral q morning - [id number]  . folic acid  1 mg Oral Daily  . heparin  5,000 Units Subcutaneous Q8H  . iron polysaccharides  150 mg Oral Daily  . levothyroxine  75 mcg Oral QAC breakfast  . multivitamin with minerals  1 tablet Oral q morning - 10a  . omega-3 acid ethyl esters  1 g Oral Daily  .  pantoprazole  40 mg Oral Daily  . polyethylene glycol  17 g Oral BID  . sodium chloride flush  3 mL Intravenous Q12H   Continuous Infusions: . sodium chloride 100 mL/hr at 04/12/16  0800   PRN Meds:.ALPRAZolam  Micro Results No results found for this or any previous visit (from the past 240 hour(s)).  Radiology Reports Ct Abdomen Pelvis W Contrast  04/12/2016  CLINICAL DATA:  History of small bowel obstructions with recurrent symptoms. EXAM: CT ABDOMEN AND PELVIS WITH CONTRAST TECHNIQUE: Multidetector CT imaging of the abdomen and pelvis was performed using the standard protocol following bolus administration of intravenous contrast. CONTRAST:  11mL ISOVUE-300 IOPAMIDOL (ISOVUE-300) INJECTION 61% COMPARISON:  01/16/2015 FINDINGS: Lower chest and abdominal wall: Mastectomy and partly visualized right breast implant Hepatobiliary: Stable 1 cm cyst along the gallbladder fossa.Cholelithiasis. The gallbladder is full but there is no inflammatory changes. Normal common bile duct diameter. Pancreas: Stable appearance Spleen: Unremarkable. Adrenals/Urinary Tract: Negative adrenals. Staghorn calculus casting the lower right urinary collecting system. Punctate calculi in the upper poles. Bilateral cortical cysts. No hydronephrosis or ureteral calculus Unremarkable bladder. Reproductive:Hysterectomy. Unchanged 29 mm right ovarian cyst, considered incidental given stability and patient's age. Stomach/Bowel: Enterocolonic anastomosis with chronic dilatation of bowel proximal to the anastomosis. This chronic dilatation may be secondary to prior obstruction or postoperative aneurysm. The anastomosis is widely patent with contrast reaching the ascending colon. No inflammatory or obstructive changes at the remnant terminal ileum. The appendix is not seen. No pericecal inflammation. Large volume formed stool throughout the colon. Vascular/Lymphatic: No acute vascular abnormality. No mass or adenopathy. Peritoneal: Small low right pelvic cyst remains stable and benign. This could be a peritoneal inclusion cyst, lymphangioma, or congenital duplication cyst. Musculoskeletal: Diffuse degenerative disc  disease with levoscoliosis. No acute osseous finding. IMPRESSION: 1. Large volume stool suggesting constipation. No obstruction or impaction. 2. Cholelithiasis without cholecystitis. 3. Nephrolithiasis including staghorn calculus on the right. 4. Additional incidental findings remain stable Electronically Signed   By: Monte Fantasia M.D.   On: 04/12/2016 01:29   Dg Bone Density  03/18/2016  EXAM: DUAL X-RAY ABSORPTIOMETRY (DXA) FOR BONE MINERAL DENSITY IMPRESSION: Ordering Physician: Jake Samples PA-C, Your patient Clariece Yunk completed a BMD test on 03/18/2016 using the [place] (software version: 14.10) manufactured by [employer]. The following summarizes the results of our evaluation. PATIENT BIOGRAPHICAL: Name: DEONI, KOPINSKI Patient ID: [id number] Birth Date: 08/05/1925 Height: 62.0 in. Gender: Female Exam Date: 03/18/2016 Weight: 126.0 lbs. Indications: Bilateral Oophrectomy, Caucasian, Follow up Osteopenia, Height Loss, Low Calcium Intake, Parent Hip Fracture, Post Menopausal Fractures: Treatments: Calcium, Vitamin D DENSITOMETRY RESULTS: Site         Region     Measured Date Measured Age WHO Classification Young Adult T-score BMD         %Change vs. Previous Significant Change (*) DualFemur Neck Right 03/18/[age] 88.4 Oste[age] -2.2 0.732 g/cm2 Left Forearm Radius 33% 03/18/2016 88.4 Osteoporosis -4.1 0.419 g/cm2 ASSESSMENT: BMD as determined from Forearm Radius 33% is 0.419 g/cm2 with a T-Score of -4.1. This patient is considered osteoporotic according to [facility]) criteria. Per official position of the ISCD, it is not possible to quantitatively compare BMD or calculate a LSC between different facilities. (Lumbar spine was not utilized due to advanced degenerative changes.) (Patient does not meet criteria for FRAX assessment due to diagnosis of osteoporosis.) World Health Organization [facility]) criteria for post-menopausal, Caucasian Women:  Normal:  T-score at or above -1 SD Osteopenia:   T-score between -1 and -2.5 SD Osteoporosis: T-score at or below -2.5 SD RECOMMENDATIONS: [place] recommends that FDA-approved medial therapies be considered in postmenopausal women and men age 66 or older with a: 1. Hip or vertebral (clinical or morphometric) fracture. 2. T-Score of < -2.5 at the spine or hip. 3. Ten-year fracture probability by FRAX of 3% or greater for hip fracture or 20% or greater for major osteoporotic fracture. All treatment decisions require clinical judgment and consideration of individual patient factors, including patient preferences, co-morbidities, previous drug use, risk factors not captured in the FRAX model (e.g. falls, vitamin D deficiency, increased bone turnover, interval significant decline in bone density) and possible under-or over-estimation of fracture risk by FRAX. All patients should ensure an adequate intake of dietary calcium (1200 mg/d) and vitamin D (800 IU daily) unless contraindicated. FOLLOW-UP: People with diagnosed cases of osteoporosis or osteopenia should be regularly tested for bone mineral density. For patients eligible for Medicare, routine testing is allowed once every 2 years. Testing frequency can be increased for patients who have rapidly progressing disease, or for those who are receiving medical therapy to restore bone mass. I have reviewed this report, and agree with the above findings. [facility] Electronically Signed   By: Rolm Baptise M.D.   On: 03/18/2016 10:58   US Breast Ltd Uni Left Inc Axilla  03/18/2016  CLINICAL DATA:  History of right mastectomy. The patient reports a bruise in the left breast approximately 1 year ago. Bruise has resolved. Patient has some persistent mild left breast soreness. She also reports a left nipple pimple. EXAM: 2D DIGITAL DIAGNOSTIC LEFT MAMMOGRAM WITH CAD AND ADJUNCT TOMO ULTRASOUND LEFT BREAST COMPARISON:  08/24/2009  and earlier ACR Breast Density Category b: There are scattered areas of fibroglandular density. FINDINGS: No suspicious mass, distortion, or microcalcifications are identified to suggest presence of malignancy. Mammographic images were processed with CAD. On physical exam, there is a small, approximately 3 mm rounded white lesion on the surface of the left nipple, approximately 12-1 o'clock location. No associated erythema or nipple flaking. There is no associated tenderness. She reports no spontaneous drainage from this lesion. Targeted ultrasound is performed, showing normal appearing retroareolar and periareolar left breast tissue. Ultrasound the entire lower portion of the left breast is negative. IMPRESSION: 1.  No mammographic or ultrasound evidence for malignancy. 2. Probable small sebaceous cyst or milium of the left nipple. Findings would be atypical for Paget's disease. RECOMMENDATION: Left screening mammogram recommended in 1 year. I have discussed the findings and recommendations with the patient. Results were also provided in writing at the conclusion of the visit. If applicable, a reminder letter will be sent to the patient regarding the next appointment. BI-RADS CATEGORY  1: Negative. Electronically Signed   By: Nolon Nations M.D.   On: 03/18/2016 10:23   Mm Diag Breast Tomo Uni Left  03/18/2016  CLINICAL DATA:  History of right mastectomy. The patient reports a bruise in the left breast approximately 1 year ago. Bruise has resolved. Patient has some persistent mild left breast soreness. She also reports a left nipple pimple. EXAM: 2D DIGITAL DIAGNOSTIC LEFT MAMMOGRAM WITH CAD AND ADJUNCT TOMO ULTRASOUND LEFT BREAST COMPARISON:  08/24/2009 and earlier ACR Breast Density Category b: There are scattered areas of fibroglandular density. FINDINGS: No suspicious mass, distortion, or microcalcifications are identified to suggest presence of malignancy. Mammographic images were processed with CAD. On  physical exam,  there is a small, approximately 3 mm rounded white lesion on the surface of the left nipple, approximately 12-1 o'clock location. No associated erythema or nipple flaking. There is no associated tenderness. She reports no spontaneous drainage from this lesion. Targeted ultrasound is performed, showing normal appearing retroareolar and periareolar left breast tissue. Ultrasound the entire lower portion of the left breast is negative. IMPRESSION: 1.  No mammographic or ultrasound evidence for malignancy. 2. Probable small sebaceous cyst or milium of the left nipple. Findings would be atypical for Paget's disease. RECOMMENDATION: Left screening mammogram recommended in 1 year. I have discussed the findings and recommendations with the patient. Results were also provided in writing at the conclusion of the visit. If applicable, a reminder letter will be sent to the patient regarding the next appointment. BI-RADS CATEGORY  1: Negative. Electronically Signed   By: Nolon Nations M.D.   On: 03/18/2016 10:23    Time Spent in minutes  30   Johnatha Zeidman K M.D on 04/12/2016 at 10:30 AM  Between 7am to 7pm - Pager - [phone]  After 7pm go to [email or web address] - password [facility]  [facility] -  Office  [phone]

## 2016-04-12 NOTE — H&P (Signed)
History and Physical    Jane Andrews. Jerl Mina [id number] DOB: 1925/08/04 DOA: 04/11/2016  Referring MD/NP/PA: Rolland Porter, MD PCP: [facility] Specialists:  Gastroenterology; Danie Binder, MD  Cardiology; Yehuda Savannah, MD Patient coming from: home  Chief Complaint: abdominal pain  HPI: Jane Andrews is a 82 y.o. female with medical history significant of HTN, CKD stage 3, hypothyroidism, a-fib, and breast carcinoma presented with complaints of sudden onset, intermittent, cramping, mid-abdominal pain onset around 3am on 4/28. She  reports having a watery BM yesterday, but had not had on since onset of pain. She has also not eaten since onset due to pain. While in the ED, she was given an enema and had a large BM and now feels much improved, though she was lightheaded and her hands were throbbing.  ED Course: While in the ED, workup showed CBG of 132, serum glucose unremarkable, BUN and creatinine were wnl. Her LFT's were normal. WBC was unremarkable.UA was also unremarkable. A CT abd was performed and showed a large volume of stool suggesting constipation. No obstruction or impaction was noted. She received an enema in the ED with a large BM. She subsequently became lightheaded, likely vasovagal reaction, so hospitalist was asked to admit for observation since the patient lives by herself.  Review of Systems: As per HPI otherwise 10 point review of systems negative.    Past Medical History  Diagnosis Date  . Hypertension   . Hypothyroid   . Abdominal adhesions 1994  . Atrial fibrillation ([place]) 10/2012    Associated with severe anemia and esophageal pill impaction  . Gastroesophageal reflux disease     Hiatal hernia  . Anxiety and depression   . Cholelithiasis     asymptomatic  . Upper GI bleed 2004    Multiple episodes of melena-? due to gastritis or adverse drug effect (nonsteroidals, small bowel ulceration with Fosamax); caused by Pepto-Bismol  during one Emergency Department evaluation  . Malabsorption     Short gut syndrome following small bowel resection surgery x2  . Breast carcinoma ([place])     right mastectomy "25+ years ago"  . Anemia   . Arthritis   . Low back pain   . Allergic rhinitis   . Nephrolithiasis 2004    painless hematuria    Past Surgical History  Procedure Laterality Date  . Abdominal hysterectomy      emergency s/p delivery  . Bowel resection      Resulting short gut syndrome  . Colonoscopy w/ polypectomy  2005    Lipoma; diverticulosis  . Upper gastrointestinal endoscopy    . Mastectomy  right breast  . Colonoscopy with esophagogastroduodenoscopy (egd)  11/22/2012    [facility]  . Mastectomy      Carcinoma of the breast; right  . Abdominal hysterectomy  1960    massive gynecologic bleeding  . Laparoscopic lysis of adhesions  1965    s/p adhesions     reports that she has quit smoking. Her smoking use included Cigarettes. She has a 30 pack-year smoking history. She has never used smokeless tobacco. She reports that she does not drink alcohol or use illicit drugs.  Allergies  Allergen Reactions  . Codeine Nausea And Vomiting    Family History  Problem Relation Age of Onset  . Anuerysm Father   . Rheum arthritis Sister   . Healthy Sister   . COPD Sister   . Healthy Brother   . Cancer    . Colon cancer  Neg Hx     Prior to Admission medications   Medication Sig Start Date End Date Taking? Authorizing Provider  ALPRAZolam Duanne Moron) 1 MG tablet Take 1 mg by mouth at bedtime as needed for sleep.    Historical Provider, MD  Calcium Carbonate-Vitamin D (CALCIUM + D PO) Take 1 tablet by mouth daily.    Historical Provider, MD  Cyanocobalamin (VITAMIN B-12 IJ) Inject as directed every 30 (thirty) days.    Historical Provider, MD  doxycycline (VIBRA-TABS) 100 MG tablet Take 100 mg by mouth daily. 30 day course starting on 02/19/2016 02/19/16   Historical Provider, MD  DULoxetine (CYMBALTA) 60 MG capsule  Take 60 mg by mouth every morning.     Historical Provider, MD  folic acid (FOLVITE) 1 MG tablet Take 1 mg by mouth daily.    Historical Provider, MD  glucosamine-chondroitin 500-400 MG tablet Take 1 tablet by mouth every morning.     Historical Provider, MD  hydrochlorothiazide (MICROZIDE) 12.5 MG capsule Take 1 tablet twice daily as needed for swelling. 03/12/16   Veryl Speak, MD  iron polysaccharides (NIFEREX) 150 MG capsule Take 1 capsule (150 mg total) by mouth every Monday, Wednesday, and Friday. Patient taking differently: Take 150 mg by mouth daily.  06/27/13   Rogene Houston, MD  Lactobacillus (ACIDOPHILUS) 100 MG CAPS Take 100 mg by mouth every morning.     Historical Provider, MD  levothyroxine (SYNTHROID, LEVOTHROID) 75 MCG tablet Take 75 mcg by mouth daily before breakfast.    Historical Provider, MD  Multiple Vitamin (MULTIVITAMIN) tablet Take 1 tablet by mouth every morning.     Historical Provider, MD  Omega-3 Fatty Acids (FISH OIL) 1000 MG CAPS Take 1,000 mg by mouth every morning.     Historical Provider, MD  pantoprazole (PROTONIX) 40 MG tablet Take 40 mg by mouth daily. 12/20/15   Historical Provider, MD  Vitamin D, Ergocalciferol, (DRISDOL) 50000 units CAPS capsule Take 50,000 Units by mouth every 7 (seven) days.    Historical Provider, MD    Physical Exam: Filed Vitals:   04/11/16 2300 04/11/16 2330 04/12/16 0000 04/12/16 0008  BP: 154/67 166/80 169/70 169/70  Pulse: 71 87 73 77  Temp:      TempSrc:      Resp:    16  Height:      Weight:      SpO2: 93% 90% 97% 96%      Constitutional: NAD, calm, comfortable Filed Vitals:   04/11/16 2300 04/11/16 2330 04/12/16 0000 04/12/16 0008  BP: 154/67 166/80 169/70 169/70  Pulse: 71 87 73 77  Temp:      TempSrc:      Resp:    16  Height:      Weight:      SpO2: 93% 90% 97% 96%   Eyes: PERRL, lids and conjunctivae normal ENMT: Mucous membranes are moist. Posterior pharynx clear of any exudate or lesions.Normal  dentition.  Neck: normal, supple, no masses, no thyromegaly Respiratory: clear to auscultation bilaterally, no wheezing, no crackles. Normal respiratory effort. No accessory muscle use.  Cardiovascular: Regular rate and rhythm, no murmurs / rubs / gallops. No extremity edema. 2+ pedal pulses. No carotid bruits.  Abdomen: no tenderness, no masses palpated. No hepatosplenomegaly. Bowel sounds positive.  Musculoskeletal: no clubbing / cyanosis. No joint deformity upper and lower extremities. Good ROM, no contractures. Normal muscle tone.  Skin: no rashes, lesions, ulcers. No induration Neurologic: CN 2-12 grossly intact. Sensation intact, DTR normal. Strength  5/5 in all 4.  Psychiatric: Normal judgment and insight. Alert and oriented x 3. Normal mood.   Labs on Admission: I have personally reviewed following labs and imaging studies  CBC:  Recent Labs Lab 04/11/16 2252  WBC 9.8  HGB 12.1  HCT 36.7  MCV 87.8  PLT 0000000   Basic Metabolic Panel:  Recent Labs Lab 04/11/16 2252  NA 142  K 3.5  CL 109  CO2 22  GLUCOSE 96  BUN 18  CREATININE 0.98  CALCIUM 9.2    Liver Function Tests:  Recent Labs Lab 04/11/16 2252  AST 16  ALT 15  ALKPHOS 55  BILITOT 0.7  PROT 7.0  ALBUMIN 4.1    Recent Labs Lab 04/11/16 2252  LIPASE 32    Recent Labs Lab 04/12/16 0249  GLUCAP 132*   Urine analysis:    Component Value Date/Time   COLORURINE YELLOW 04/12/2016 0027   APPEARANCEUR CLEAR 04/12/2016 0027   LABSPEC <1.005* 04/12/2016 0027   PHURINE 6.0 04/12/2016 0027   GLUCOSEU NEGATIVE 04/12/2016 0027   HGBUR TRACE* 04/12/2016 0027   BILIRUBINUR NEGATIVE 04/12/2016 0027   KETONESUR TRACE* 04/12/2016 0027   PROTEINUR NEGATIVE 04/12/2016 0027   UROBILINOGEN 0.2 01/16/2015 0848   NITRITE NEGATIVE 04/12/2016 0027   LEUKOCYTESUR TRACE* 04/12/2016 0027   Radiological Exams on Admission: Ct Abdomen Pelvis W Contrast  04/12/2016  CLINICAL DATA:  History of small bowel  obstructions with recurrent symptoms. EXAM: CT ABDOMEN AND PELVIS WITH CONTRAST TECHNIQUE: Multidetector CT imaging of the abdomen and pelvis was performed using the standard protocol following bolus administration of intravenous contrast. CONTRAST:  129mL ISOVUE-300 IOPAMIDOL (ISOVUE-300) INJECTION 61% COMPARISON:  01/16/2015 FINDINGS: Lower chest and abdominal wall: Mastectomy and partly visualized right breast implant Hepatobiliary: Stable 1 cm cyst along the gallbladder fossa.Cholelithiasis. The gallbladder is full but there is no inflammatory changes. Normal common bile duct diameter. Pancreas: Stable appearance Spleen: Unremarkable. Adrenals/Urinary Tract: Negative adrenals. Staghorn calculus casting the lower right urinary collecting system. Punctate calculi in the upper poles. Bilateral cortical cysts. No hydronephrosis or ureteral calculus Unremarkable bladder. Reproductive:Hysterectomy. Unchanged 29 mm right ovarian cyst, considered incidental given stability and patient's age. Stomach/Bowel: Enterocolonic anastomosis with chronic dilatation of bowel proximal to the anastomosis. This chronic dilatation may be secondary to prior obstruction or postoperative aneurysm. The anastomosis is widely patent with contrast reaching the ascending colon. No inflammatory or obstructive changes at the remnant terminal ileum. The appendix is not seen. No pericecal inflammation. Large volume formed stool throughout the colon. Vascular/Lymphatic: No acute vascular abnormality. No mass or adenopathy. Peritoneal: Small low right pelvic cyst remains stable and benign. This could be a peritoneal inclusion cyst, lymphangioma, or congenital duplication cyst. Musculoskeletal: Diffuse degenerative disc disease with levoscoliosis. No acute osseous finding. IMPRESSION: 1. Large volume stool suggesting constipation. No obstruction or impaction. 2. Cholelithiasis without cholecystitis. 3. Nephrolithiasis including staghorn calculus on  the right. 4. Additional incidental findings remain stable Electronically Signed   By: Monte Fantasia M.D.   On: 04/12/2016 01:29    EKG: Independently reviewed.   Assessment/Plan Active Problems:   Hypertension   CKD (chronic kidney disease) stage 3, GFR 30-59 ml/min   Hypothyroid   Obstipation   Vagal reaction  1. Vagal reaction. Will give IVFs and monitor.  Hold her diuretics.  Will admit her for OBS.  Start her diet.  If she feels       well, can likely discharge her soon.  2. CKD stage 3. Stable. BUN  and creatinine unremarkable.  3. Hypothyroidism. Continue synthroid.  Check TSH.  4. HTN. Hold antihypertensives for now insetting of vagal reaction. 5. Afib. Stable.    DVT prophylaxis: Lovenox Code Status: Full Family Communication: No family present at bedside Disposition Plan: anticipate discharge home in 1-2 days Consults called: none Admission status: admit for observation  Orvan Falconer,  MD FACP [facility]  If 7PM-7AM, please contact night-coverage [email or web address] Password [email or web address] 04/12/2016, 3:01 AM   By signing my name below, I, Delene Ruffini, attest that this documentation has been prepared under the direction and in the presence of Orvan Falconer, MD. Electronically Signed: Delene Ruffini, Scribe 04/12/2016 3:00am

## 2016-04-12 NOTE — Progress Notes (Signed)
**Note De-identified Jane Andrews Obfuscation** EKG completed and placed in patient chart 

## 2016-04-12 NOTE — Progress Notes (Signed)
PHARMACIST - PHYSICIAN ORDER COMMUNICATION  CONCERNING: P&T Medication Policy on Herbal Medications  DESCRIPTION:  This patient's order for:  Glucosamine-chondroitin  has been noted.  This product(s) is classified as an "herbal" or natural product. Due to a lack of definitive safety studies or FDA approval, nonstandard manufacturing practices, plus the potential risk of unknown drug-drug interactions while on inpatient medications, the Pharmacy and Therapeutics Committee does not permit the use of "herbal" or natural products of this type within [facility].   ACTION TAKEN: The pharmacy department is unable to verify this order at this time and your patient has been informed of this safety policy. Please reevaluate patient's clinical condition at discharge and address if the herbal or natural product(s) should be resumed at that time.   

## 2016-04-13 DIAGNOSIS — K5900 Constipation, unspecified: Secondary | ICD-10-CM | POA: Diagnosis not present

## 2016-04-13 DIAGNOSIS — I10 Essential (primary) hypertension: Secondary | ICD-10-CM | POA: Diagnosis not present

## 2016-04-13 DIAGNOSIS — N183 Chronic kidney disease, stage 3 (moderate): Secondary | ICD-10-CM | POA: Diagnosis not present

## 2016-04-13 DIAGNOSIS — R55 Syncope and collapse: Secondary | ICD-10-CM | POA: Diagnosis not present

## 2016-04-13 MED ORDER — POLYETHYLENE GLYCOL 3350 17 G PO PACK: 17.0000 g | PACK | Freq: Every day | ORAL | Status: DC

## 2016-04-13 MED ORDER — DOCUSATE SODIUM 100 MG PO CAPS: 100.0000 mg | ORAL_CAPSULE | Freq: Every day | ORAL | Status: DC

## 2016-04-13 NOTE — Discharge Summary (Signed)
Jane Andrews, is a 82 y.o. female  DOB 1925/09/14  MRN [id number].  Admission date:  04/11/2016  Admitting Physician  Orvan Falconer, MD  Discharge Date:  04/13/2016   Primary MD  Jana Half  Recommendations for primary care physician for things to follow:   Monitor blood pressure, orthostatics closely.   Admission Diagnosis  Generalized abdominal pain [R10.84] Near syncope [R55] Constipation, unspecified constipation type [K59.00]   Discharge Diagnosis  Generalized abdominal pain [R10.84] Near syncope [R55] Constipation, unspecified constipation type [K59.00]     Active Problems:   Hypertension   CKD (chronic kidney disease) stage 3, GFR 30-59 ml/min   Hypothyroid   Obstipation   Vagal reaction      Past Medical History  Diagnosis Date  . Hypertension   . Hypothyroid   . Abdominal adhesions 1994  . Atrial fibrillation ([place]) 10/2012    Associated with severe anemia and esophageal pill impaction  . Gastroesophageal reflux disease     Hiatal hernia  . Anxiety and depression   . Cholelithiasis     asymptomatic  . Upper GI bleed 2004    Multiple episodes of melena-? due to gastritis or adverse drug effect (nonsteroidals, small bowel ulceration with Fosamax); caused by Pepto-Bismol during one Emergency Department evaluation  . Malabsorption     Short gut syndrome following small bowel resection surgery x2  . Breast carcinoma ([place])     right mastectomy "25+ years ago"  . Anemia   . Arthritis   . Low back pain   . Allergic rhinitis   . Nephrolithiasis 2004    painless hematuria    Past Surgical History  Procedure Laterality Date  . Abdominal hysterectomy      emergency s/p delivery  . Bowel resection      Resulting short gut syndrome  . Colonoscopy w/ polypectomy  2005   Lipoma; diverticulosis  . Upper gastrointestinal endoscopy    . Mastectomy  right breast  . Colonoscopy with esophagogastroduodenoscopy (egd)  11/22/2012    [facility]  . Mastectomy      Carcinoma of the breast; right  . Abdominal hysterectomy  1960    massive gynecologic bleeding  . Laparoscopic lysis of adhesions  1965    s/p adhesions       HPI  from the history and physical done on the day of admission:   Jane Andrews is a 82 y.o. female with medical history significant of HTN, CKD stage 3, hypothyroidism, a-fib, and breast carcinoma presented with Abdominal pain, in the ER CT scan of the abdomen showed large stool burden, she was provided with an enema, she had a good bowel movement however while using the bathroom she got lightheaded and admitted to the hospital. She was also found to be mildly orthostatic next day of the hospital stay. Currently feeling much better with hydration.      Hospital Course:   1. Presyncope. Due to combination of vasovagal episode while using the bathroom along with mild orthostatic hypotension. Resolved after  IV fluids, now completely symptom free ambulated in the hallway without any symptoms, eager to go home, will be discharged. Request PCP to monitor orthostatics. If evidence of dehydration again discontinue HCTZ.  2. Large stool burden on CT scan. Better after an enema and bowel regimen, placed on bowel regimen after discharge.  3. Incidental findings of large right-sided staghorn calculi and gallstones. Asymptomatic follow with PCP.  4. Hypothyroidism. Continue home dose Synthroid. TSH stable at 3.5.  5. ? History of paroxysmal atrial fibrillation noted in the chart. Stable baseline EKG and on telemetry, [place] vasc 2 score of greater than 4. Not on anticoagulation. We'll defer this to PCP and primary cardiologist. Not on any cardiac medications. Patient unsure of this diagnosis, request PCP to address.  6. GERD. On PPI continue.  7.  History of short gut syndrome, bowel adhesions in the past. Stable.      Follow UP  Follow-up Information    Follow up with JACKSON,SAMANTHA, PA-C. Schedule an appointment as soon as possible for a visit in 3 days.   Specialty:  Family Medicine   Contact information:   [address] [place] [id number] [phone]        Consults obtained - None  Discharge Condition: Stable  Diet and Activity recommendation: See Discharge Instructions below  Discharge Instructions       Discharge Instructions    Diet - low sodium heart healthy    Complete by:  As directed      Discharge instructions    Complete by:  As directed   Follow with Primary MD Delman Cheadle, PA-C in 3 days   Get CBC, CMP, 2 view Chest X ray checked  by Primary MD next visit.    Activity: As tolerated with Full fall precautions use walker/cane & assistance as needed   Disposition Home     Diet:   Heart Healthy    For Heart failure patients - Check your Weight same time everyday, if you gain over 2 pounds, or you develop in leg swelling, experience more shortness of breath or chest pain, call your Primary MD immediately. Follow Cardiac Low Salt Diet and 1.5 lit/day fluid restriction.   On your next visit with your primary care physician please Get Medicines reviewed and adjusted.   Please request your Prim.MD to go over all Hospital Tests and Procedure/Radiological results at the follow up, please get all Hospital records sent to your Prim MD by signing hospital release before you go home.   If you experience worsening of your admission symptoms, develop shortness of breath, life threatening emergency, suicidal or homicidal thoughts you must seek medical attention immediately by calling 911 or calling your MD immediately  if symptoms less severe.  You Must read complete instructions/literature along with all the possible adverse reactions/side effects for all the Medicines you take and that  have been prescribed to you. Take any new Medicines after you have completely understood and accpet all the possible adverse reactions/side effects.   Do not drive, operating heavy machinery, perform activities at heights, swimming or participation in water activities or provide baby sitting services if your were admitted for syncope or siezures until you have seen by Primary MD or a Neurologist and advised to do so again.  Do not drive when taking Pain medications.    Do not take more than prescribed Pain, Sleep and Anxiety Medications  Special Instructions: If you have smoked or chewed Tobacco  in the last 2 yrs please stop smoking,  stop any regular Alcohol  and or any Recreational drug use.  Wear Seat belts while driving.   Please note  You were cared for by a hospitalist during your hospital stay. If you have any questions about your discharge medications or the care you received while you were in the hospital after you are discharged, you can call the unit and asked to speak with the hospitalist on call if the hospitalist that took care of you is not available. Once you are discharged, your primary care physician will handle any further medical issues. Please note that NO REFILLS for any discharge medications will be authorized once you are discharged, as it is imperative that you return to your primary care physician (or establish a relationship with a primary care physician if you do not have one) for your aftercare needs so that they can reassess your need for medications and monitor your lab values.     Increase activity slowly    Complete by:  As directed              Discharge Medications       Medication List    TAKE these medications        Acidophilus 100 MG Caps  Take 100 mg by mouth every morning.     alendronate 70 MG tablet  Commonly known as:  FOSAMAX  Take 1 tablet by mouth once a week.     ALPRAZolam 1 MG tablet  Commonly known as:  XANAX  Take 1 mg by  mouth at bedtime as needed for sleep.     CALCIUM + D PO  Take 1 tablet by mouth daily.     docusate sodium 100 MG capsule  Commonly known as:  COLACE  Take 1 capsule (100 mg total) by mouth daily.     DULoxetine 60 MG capsule  Commonly known as:  CYMBALTA  Take 60 mg by mouth every morning.     Fish Oil 1000 MG Caps  Take 1,000 mg by mouth every morning.     folic acid 1 MG tablet  Commonly known as:  FOLVITE  Take 1 mg by mouth daily.     glucosamine-chondroitin 500-400 MG tablet  Take 1 tablet by mouth every morning.     hydrochlorothiazide 12.5 MG capsule  Commonly known as:  MICROZIDE  Take 1 tablet twice daily as needed for swelling.     iron polysaccharides 150 MG capsule  Commonly known as:  NIFEREX  Take 1 capsule (150 mg total) by mouth every Monday, Wednesday, and Friday.     levothyroxine 75 MCG tablet  Commonly known as:  SYNTHROID, LEVOTHROID  Take 75 mcg by mouth daily before breakfast.     multivitamin tablet  Take 1 tablet by mouth every morning.     pantoprazole 40 MG tablet  Commonly known as:  PROTONIX  Take 40 mg by mouth daily.     polyethylene glycol packet  Commonly known as:  MIRALAX / GLYCOLAX  Take 17 g by mouth daily.     VITAMIN B-12 IJ  Inject as directed every 30 (thirty) days.     Vitamin D (Ergocalciferol) 50000 units Caps capsule  Commonly known as:  DRISDOL  Take 50,000 Units by mouth every 7 (seven) days.        Major procedures and Radiology Reports - PLEASE review detailed and final reports for all details, in brief -       Ct Abdomen Pelvis W Contrast  04/12/2016  CLINICAL DATA:  History of small bowel obstructions with recurrent symptoms. EXAM: CT ABDOMEN AND PELVIS WITH CONTRAST TECHNIQUE: Multidetector CT imaging of the abdomen and pelvis was performed using the standard protocol following bolus administration of intravenous contrast. CONTRAST:  147mL ISOVUE-300 IOPAMIDOL (ISOVUE-300) INJECTION 61% COMPARISON:   01/16/2015 FINDINGS: Lower chest and abdominal wall: Mastectomy and partly visualized right breast implant Hepatobiliary: Stable 1 cm cyst along the gallbladder fossa.Cholelithiasis. The gallbladder is full but there is no inflammatory changes. Normal common bile duct diameter. Pancreas: Stable appearance Spleen: Unremarkable. Adrenals/Urinary Tract: Negative adrenals. Staghorn calculus casting the lower right urinary collecting system. Punctate calculi in the upper poles. Bilateral cortical cysts. No hydronephrosis or ureteral calculus Unremarkable bladder. Reproductive:Hysterectomy. Unchanged 29 mm right ovarian cyst, considered incidental given stability and patient's age. Stomach/Bowel: Enterocolonic anastomosis with chronic dilatation of bowel proximal to the anastomosis. This chronic dilatation may be secondary to prior obstruction or postoperative aneurysm. The anastomosis is widely patent with contrast reaching the ascending colon. No inflammatory or obstructive changes at the remnant terminal ileum. The appendix is not seen. No pericecal inflammation. Large volume formed stool throughout the colon. Vascular/Lymphatic: No acute vascular abnormality. No mass or adenopathy. Peritoneal: Small low right pelvic cyst remains stable and benign. This could be a peritoneal inclusion cyst, lymphangioma, or congenital duplication cyst. Musculoskeletal: Diffuse degenerative disc disease with levoscoliosis. No acute osseous finding. IMPRESSION: 1. Large volume stool suggesting constipation. No obstruction or impaction. 2. Cholelithiasis without cholecystitis. 3. Nephrolithiasis including staghorn calculus on the right. 4. Additional incidental findings remain stable Electronically Signed   By: Monte Fantasia M.D.   On: 04/12/2016 01:29        Micro Results     No results found for this or any previous visit (from the past 240 hour(s)).     Today   Subjective    Jazyah Wheaton today has no  headache,no chest abdominal pain,no new weakness tingling or numbness, feels much better wants to go home today.     Objective   Blood pressure 110/82, pulse 81, temperature 98 F (36.7 C), temperature source Oral, resp. rate 20, height 5\' 4"  (1.626 m), weight 54.885 kg (121 lb), SpO2 97 %.   Intake/Output Summary (Last 24 hours) at 04/13/16 0741 Last data filed at 04/12/16 1700  Gross per 24 hour  Intake    720 ml  Output      0 ml  Net    720 ml    Exam Awake Alert, Oriented x 3, No new F.N deficits, Normal affect [place].AT,PERRAL Supple Neck,No JVD, No cervical lymphadenopathy appriciated.  Symmetrical Chest wall movement, Good air movement bilaterally, CTAB RRR,No Gallops,Rubs or new Murmurs, No Parasternal Heave +ve B.Sounds, Abd Soft, Non tender, No organomegaly appriciated, No rebound -guarding or rigidity. No Cyanosis, Clubbing or edema, No new Rash or bruise   Data Review   CBC w Diff: Lab Results  Component Value Date   WBC 9.8 04/11/2016   HGB 12.1 04/11/2016   HCT 36.7 04/11/2016   PLT 181 04/11/2016   LYMPHOPCT 30 03/12/2016   MONOPCT 9 03/12/2016   EOSPCT 9 03/12/2016   BASOPCT 1 03/12/2016    CMP: Lab Results  Component Value Date   NA 142 04/11/2016   K 3.5 04/11/2016   CL 109 04/11/2016   CO2 22 04/11/2016   BUN 18 04/11/2016   CREATININE 0.98 04/11/2016   CREATININE 1.42* 05/27/2012   PROT 7.0 04/11/2016   ALBUMIN 4.1 04/11/2016  BILITOT 0.7 04/11/2016   ALKPHOS 55 04/11/2016   AST 16 04/11/2016   ALT 15 04/11/2016  .   Total Time in preparing paper work, data evaluation and todays exam - 35 minutes  Thurnell Lose M.D on 04/13/2016 at 7:41 AM  [facility]   Office  [phone]

## 2016-04-13 NOTE — Discharge Instructions (Signed)
Follow with Primary MD Delman Cheadle, PA-C in 3 days   Get CBC, CMP, 2 view Chest X ray checked  by Primary MD next visit.    Activity: As tolerated with Full fall precautions use walker/cane & assistance as needed   Disposition Home     Diet:   Heart Healthy    For Heart failure patients - Check your Weight same time everyday, if you gain over 2 pounds, or you develop in leg swelling, experience more shortness of breath or chest pain, call your Primary MD immediately. Follow Cardiac Low Salt Diet and 1.5 lit/day fluid restriction.   On your next visit with your primary care physician please Get Medicines reviewed and adjusted.   Please request your Prim.MD to go over all Hospital Tests and Procedure/Radiological results at the follow up, please get all Hospital records sent to your Prim MD by signing hospital release before you go home.   If you experience worsening of your admission symptoms, develop shortness of breath, life threatening emergency, suicidal or homicidal thoughts you must seek medical attention immediately by calling 911 or calling your MD immediately  if symptoms less severe.  You Must read complete instructions/literature along with all the possible adverse reactions/side effects for all the Medicines you take and that have been prescribed to you. Take any new Medicines after you have completely understood and accpet all the possible adverse reactions/side effects.   Do not drive, operating heavy machinery, perform activities at heights, swimming or participation in water activities or provide baby sitting services if your were admitted for syncope or siezures until you have seen by Primary MD or a Neurologist and advised to do so again.  Do not drive when taking Pain medications.    Do not take more than prescribed Pain, Sleep and Anxiety Medications  Special Instructions: If you have smoked or chewed Tobacco  in the last 2 yrs please stop smoking, stop any  regular Alcohol  and or any Recreational drug use.  Wear Seat belts while driving.   Please note  You were cared for by a hospitalist during your hospital stay. If you have any questions about your discharge medications or the care you received while you were in the hospital after you are discharged, you can call the unit and asked to speak with the hospitalist on call if the hospitalist that took care of you is not available. Once you are discharged, your primary care physician will handle any further medical issues. Please note that NO REFILLS for any discharge medications will be authorized once you are discharged, as it is imperative that you return to your primary care physician (or establish a relationship with a primary care physician if you do not have one) for your aftercare needs so that they can reassess your need for medications and monitor your lab values.

## 2016-04-13 NOTE — Progress Notes (Signed)
Patient with discharge orders. Discharge instructions given, patient verbalized understanding. Prescriptions given. Patient stable. Patient left in private vehicle with family.

## 2016-04-13 NOTE — Care Management Obs Status (Signed)
[facility] NOTIFICATION   Patient Details  Name: Jane Andrews. Reback MRN: [id number] Date of Birth: 10-24-1925   Medicare Observation Status Notification Given:  Yes    Briant Sites, RN 04/13/2016, 10:55 AM

## 2016-04-14 ENCOUNTER — Telehealth (INDEPENDENT_AMBULATORY_CARE_PROVIDER_SITE_OTHER): Payer: Self-pay | Admitting: Internal Medicine

## 2016-04-14 NOTE — Telephone Encounter (Signed)
Dr.Rehman was made aware. He ask if the pain was flank pain, patient that if she had a navel it would be across there from side to side. Patient was advised to take the Miralax and colace as prescribed in the ED. To see Samatha @[email or web address]  as advised , have Samatha to contact our office if she needs [name] to see her. Patient understood.

## 2016-04-14 NOTE — Telephone Encounter (Signed)
Patient called and stated that she was in the hospital over the weekend and per the patient it was for some type of blockage.  She was in the hospital 2 days and 2 nights and is still having pain.  She wants to know what she should do.  [phone]

## 2016-04-18 DIAGNOSIS — Z1389 Encounter for screening for other disorder: Secondary | ICD-10-CM | POA: Diagnosis not present

## 2016-04-18 DIAGNOSIS — Z6821 Body mass index (BMI) 21.0-21.9, adult: Secondary | ICD-10-CM | POA: Diagnosis not present

## 2016-04-18 DIAGNOSIS — K5900 Constipation, unspecified: Secondary | ICD-10-CM | POA: Diagnosis not present

## 2016-05-05 DIAGNOSIS — Z6821 Body mass index (BMI) 21.0-21.9, adult: Secondary | ICD-10-CM | POA: Diagnosis not present

## 2016-05-05 DIAGNOSIS — I10 Essential (primary) hypertension: Secondary | ICD-10-CM | POA: Diagnosis not present

## 2016-05-05 DIAGNOSIS — E782 Mixed hyperlipidemia: Secondary | ICD-10-CM | POA: Diagnosis not present

## 2016-05-05 DIAGNOSIS — Z1389 Encounter for screening for other disorder: Secondary | ICD-10-CM | POA: Diagnosis not present

## 2016-05-05 DIAGNOSIS — J209 Acute bronchitis, unspecified: Secondary | ICD-10-CM | POA: Diagnosis not present

## 2016-05-15 DIAGNOSIS — Z6821 Body mass index (BMI) 21.0-21.9, adult: Secondary | ICD-10-CM | POA: Diagnosis not present

## 2016-05-15 DIAGNOSIS — Z0000 Encounter for general adult medical examination without abnormal findings: Secondary | ICD-10-CM | POA: Diagnosis not present

## 2016-07-09 ENCOUNTER — Encounter (INDEPENDENT_AMBULATORY_CARE_PROVIDER_SITE_OTHER): Payer: Self-pay | Admitting: Internal Medicine

## 2016-07-09 ENCOUNTER — Ambulatory Visit (INDEPENDENT_AMBULATORY_CARE_PROVIDER_SITE_OTHER): Payer: Medicare Other | Admitting: Internal Medicine

## 2016-07-09 VITALS — BP 112/84 | HR 64 | Ht 64.0 in | Wt 128.6 lb

## 2016-07-09 DIAGNOSIS — K912 Postsurgical malabsorption, not elsewhere classified: Secondary | ICD-10-CM

## 2016-07-09 DIAGNOSIS — K219 Gastro-esophageal reflux disease without esophagitis: Secondary | ICD-10-CM

## 2016-07-09 DIAGNOSIS — D509 Iron deficiency anemia, unspecified: Secondary | ICD-10-CM | POA: Diagnosis not present

## 2016-07-09 NOTE — Progress Notes (Signed)
Subjective:    Patient ID: Jane Andrews. [place], female    DOB: 1925/06/21, 82 y.o.   MRN: [id number]  HPIHere today for f/u. She was last seen in July of 2016. Hx of IDAl, short gut syndrome and GERD. Hx of short gut syndrome for hx of adhesions in 1994 from a previous surgery (Hysterectomy).  IDA: Hemoglobin normal 04/11/2016 at 12.1.   Weight in July of 2016 was 121. She is doing well. Appetite is good. She has gained about 7 pounds since her last OV. Acid reflux controlled with Protonix. She is having a BM daily. Stools are usually nice and formed. Sometimes she does have loose stools. No melena or BRRB.  She goes to the [facility] and exercises on the machines or she walks.   11/22/2012   EGD & Colonoscopy  Indications:  Patient is 82 year old Caucasian female with multiple medical problems who recently developed acute and chronic anemia requiring transfusion. GI bleed was not documented that she has history of peptic ulcer disease as well as distal small bowel ulcers. She is on alendronate for osteoporosis. Prep satisfactory. Wide-open ileocolonic anastomosis located in the vicinity of hepatic flexure or ascending colon. Few diverticula at sigmoid colon. No ulcers or other mucosal abnormalities noted. Normal rectal mucosa . Small hemorrhoids below the dentate line.    CBC Latest Ref Rng & Units 04/11/2016 03/31/2016 03/12/2016  WBC 4.0 - 10.5 K/uL 9.8 7.1 5.9  Hemoglobin 12.0 - 15.0 g/dL 12.1 11.8 11.1(L)  Hematocrit 36.0 - 46.0 % 36.7 36.7 33.6(L)  Platelets 150 - 400 K/uL 181 206 174    Review of Systems Past Medical History:  Diagnosis Date  . Abdominal adhesions 1994  . Allergic rhinitis   . Anemia   . Anxiety and depression   . Arthritis   . Atrial fibrillation ([place]) 10/2012   Associated with severe anemia and esophageal pill impaction  . Breast carcinoma ([place])    right mastectomy "25+ years ago"  . Cholelithiasis    asymptomatic  . Gastroesophageal  reflux disease    Hiatal hernia  . Hypertension   . Hypothyroid   . Low back pain   . 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 Nephrolithiasis 2004   painless hematuria  . Upper GI bleed 2004   Multiple episodes of melena-? due to gastritis or adverse drug effect (nonsteroidals, small bowel ulceration with Fosamax); caused by Pepto-Bismol during one Emergency Department evaluation    Past Surgical History:  Procedure Laterality Date  . ABDOMINAL HYSTERECTOMY     emergency s/p delivery  . ABDOMINAL HYSTERECTOMY  1960   massive gynecologic bleeding  . BOWEL RESECTION     Resulting short gut syndrome  . COLONOSCOPY W/ POLYPECTOMY  2005   Lipoma; diverticulosis  . COLONOSCOPY WITH ESOPHAGOGASTRODUODENOSCOPY (EGD)  11/22/2012   Rehman  . LAPAROSCOPIC LYSIS OF ADHESIONS  1965   s/p adhesions  . MASTECTOMY  right breast  . MASTECTOMY     Carcinoma of the breast; right  . UPPER GASTROINTESTINAL ENDOSCOPY      Allergies  Allergen Reactions  . Codeine Nausea And Vomiting    Current Outpatient Prescriptions on File Prior to Visit  Medication Sig Dispense Refill  . alendronate (FOSAMAX) 70 MG tablet Take 1 tablet by mouth once a week.    . ALPRAZolam (XANAX) 1 MG tablet Take 1 mg by mouth at bedtime as needed for sleep.    . Calcium Carbonate-Vitamin  D (CALCIUM + D PO) Take 1 tablet by mouth daily.    . Cyanocobalamin (VITAMIN B-12 IJ) Inject as directed every 30 (thirty) days.    Marland Kitchen docusate sodium (COLACE) 100 MG capsule Take 1 capsule (100 mg total) by mouth daily. 30 capsule 0  . DULoxetine (CYMBALTA) 60 MG capsule Take 60 mg by mouth every morning.     . folic acid (FOLVITE) 1 MG tablet Take 1 mg by mouth daily.    Marland Kitchen glucosamine-chondroitin 500-400 MG tablet Take 1 tablet by mouth every morning.     . iron polysaccharides (NIFEREX) 150 MG capsule Take 1 capsule (150 mg total) by mouth every Monday, Wednesday, and Friday. (Patient taking  differently: Take 150 mg by mouth daily. ) 30 capsule 5  . Lactobacillus (ACIDOPHILUS) 100 MG CAPS Take 100 mg by mouth every morning.     Marland Kitchen levothyroxine (SYNTHROID, LEVOTHROID) 75 MCG tablet Take 75 mcg by mouth daily before breakfast.    . Multiple Vitamin (MULTIVITAMIN) tablet Take 1 tablet by mouth every morning.     . Omega-3 Fatty Acids (FISH OIL) 1000 MG CAPS Take 1,000 mg by mouth every morning.     . pantoprazole (PROTONIX) 40 MG tablet Take 40 mg by mouth daily.    . polyethylene glycol (MIRALAX / GLYCOLAX) packet Take 17 g by mouth daily. (Patient taking differently: Take 17 g by mouth as needed. ) 30 each 0  . Vitamin D, Ergocalciferol, (DRISDOL) 50000 units CAPS capsule Take 50,000 Units by mouth every 7 (seven) days.    . hydrochlorothiazide (MICROZIDE) 12.5 MG capsule Take 1 tablet twice daily as needed for swelling. (Patient not taking: Reported on 07/09/2016) 20 capsule 0   No current facility-administered medications on file prior to visit.        Objective:   Physical Exam Blood pressure 112/84, pulse 64, height 5\' 4"  (1.626 m), weight 128 lb 9.6 oz (58.3 kg).  Alert and oriented. Skin warm and dry. Oral mucosa is moist.   . Sclera anicteric, conjunctivae is pink. Thyroid not enlarged. No cervical lymphadenopathy. Lungs clear. Heart regular rate and rhythm.  Abdomen is soft. Bowel sounds are positive. No hepatomegaly. No abdominal masses felt. No tenderness.  No edema to lower extremities.         Assessment & Plan:     GERD controlled at this time with Protonix. Hx of short gut syndrome. She is having one still a day. Anemia: Hemoglobin remains normal. Presently taking Iron.   Short gut syndrome: She is having one BM daily. OV in 1 year.

## 2016-07-09 NOTE — Patient Instructions (Signed)
Continue present medications.  OV in 1 year. Continue to exercise.

## 2016-09-09 DIAGNOSIS — I10 Essential (primary) hypertension: Secondary | ICD-10-CM | POA: Diagnosis not present

## 2016-09-09 DIAGNOSIS — Z79899 Other long term (current) drug therapy: Secondary | ICD-10-CM | POA: Diagnosis not present

## 2016-09-09 DIAGNOSIS — F419 Anxiety disorder, unspecified: Secondary | ICD-10-CM | POA: Diagnosis not present

## 2016-09-09 DIAGNOSIS — Z6821 Body mass index (BMI) 21.0-21.9, adult: Secondary | ICD-10-CM | POA: Diagnosis not present

## 2016-09-09 DIAGNOSIS — Z1389 Encounter for screening for other disorder: Secondary | ICD-10-CM | POA: Diagnosis not present

## 2016-09-09 DIAGNOSIS — E782 Mixed hyperlipidemia: Secondary | ICD-10-CM | POA: Diagnosis not present

## 2016-09-09 DIAGNOSIS — E669 Obesity, unspecified: Secondary | ICD-10-CM | POA: Diagnosis not present

## 2016-10-20 ENCOUNTER — Telehealth (INDEPENDENT_AMBULATORY_CARE_PROVIDER_SITE_OTHER): Payer: Self-pay | Admitting: Internal Medicine

## 2016-10-20 MED ORDER — SUCRALFATE 1 GM/10ML PO SUSP: 1.0000 g | Freq: Four times a day (QID) | ORAL | 1 refills | Status: DC

## 2016-10-20 NOTE — Telephone Encounter (Signed)
Rx for Omeprazole sent to her pharmacy. Her insurance would not fill Protonix for more than 90 days.

## 2016-10-20 NOTE — Telephone Encounter (Signed)
Patient called, is having serious pain in the pit of her stomach, heartburn and burping with some sour substance.  This has been going on for 3 weeks.  She stated that she has taken Tagamet and that is not helping.  She'd like a script for something that might help.  [phone]

## 2016-10-20 NOTE — Telephone Encounter (Signed)
Rx sent to her pharmacy 

## 2016-11-24 ENCOUNTER — Telehealth (INDEPENDENT_AMBULATORY_CARE_PROVIDER_SITE_OTHER): Payer: Self-pay | Admitting: Internal Medicine

## 2016-11-24 NOTE — Telephone Encounter (Signed)
Patient called, stated she is out of Omeprazole and has no refills.  She wants to know if you want her to continue this and if so, can you call it in.  [phone]

## 2016-11-24 NOTE — Telephone Encounter (Signed)
No answer. Will call tomorrow. 

## 2016-11-26 ENCOUNTER — Telehealth (INDEPENDENT_AMBULATORY_CARE_PROVIDER_SITE_OTHER): Payer: Self-pay | Admitting: Internal Medicine

## 2016-11-26 DIAGNOSIS — K219 Gastro-esophageal reflux disease without esophagitis: Secondary | ICD-10-CM

## 2016-11-26 MED ORDER — OMEPRAZOLE 40 MG PO CPDR: 40.0000 mg | DELAYED_RELEASE_CAPSULE | Freq: Every day | ORAL | 3 refills | Status: DC

## 2016-11-26 NOTE — Telephone Encounter (Signed)
Rx for Omeprazole sent to her pharmacy

## 2016-11-26 NOTE — Telephone Encounter (Signed)
Rx sent 

## 2016-11-27 DIAGNOSIS — D225 Melanocytic nevi of trunk: Secondary | ICD-10-CM | POA: Diagnosis not present

## 2016-11-27 DIAGNOSIS — C44319 Basal cell carcinoma of skin of other parts of face: Secondary | ICD-10-CM | POA: Diagnosis not present

## 2017-01-06 DIAGNOSIS — H52223 Regular astigmatism, bilateral: Secondary | ICD-10-CM | POA: Diagnosis not present

## 2017-01-06 DIAGNOSIS — H35372 Puckering of macula, left eye: Secondary | ICD-10-CM | POA: Diagnosis not present

## 2017-01-06 DIAGNOSIS — H5203 Hypermetropia, bilateral: Secondary | ICD-10-CM | POA: Diagnosis not present

## 2017-01-06 DIAGNOSIS — H524 Presbyopia: Secondary | ICD-10-CM | POA: Diagnosis not present

## 2017-01-07 DIAGNOSIS — Z08 Encounter for follow-up examination after completed treatment for malignant neoplasm: Secondary | ICD-10-CM | POA: Diagnosis not present

## 2017-01-07 DIAGNOSIS — R208 Other disturbances of skin sensation: Secondary | ICD-10-CM | POA: Diagnosis not present

## 2017-01-07 DIAGNOSIS — Z85828 Personal history of other malignant neoplasm of skin: Secondary | ICD-10-CM | POA: Diagnosis not present

## 2017-01-12 DIAGNOSIS — M79673 Pain in unspecified foot: Secondary | ICD-10-CM | POA: Diagnosis not present

## 2017-01-12 DIAGNOSIS — M19079 Primary osteoarthritis, unspecified ankle and foot: Secondary | ICD-10-CM | POA: Diagnosis not present

## 2017-01-12 DIAGNOSIS — S93326A Dislocation of tarsometatarsal joint of unspecified foot, initial encounter: Secondary | ICD-10-CM | POA: Diagnosis not present

## 2017-01-14 DIAGNOSIS — J111 Influenza due to unidentified influenza virus with other respiratory manifestations: Secondary | ICD-10-CM | POA: Diagnosis not present

## 2017-01-14 DIAGNOSIS — I10 Essential (primary) hypertension: Secondary | ICD-10-CM | POA: Diagnosis not present

## 2017-01-14 DIAGNOSIS — R42 Dizziness and giddiness: Secondary | ICD-10-CM | POA: Diagnosis not present

## 2017-01-27 DIAGNOSIS — F329 Major depressive disorder, single episode, unspecified: Secondary | ICD-10-CM | POA: Diagnosis not present

## 2017-01-27 DIAGNOSIS — Z79899 Other long term (current) drug therapy: Secondary | ICD-10-CM | POA: Diagnosis not present

## 2017-01-27 DIAGNOSIS — Z1389 Encounter for screening for other disorder: Secondary | ICD-10-CM | POA: Diagnosis not present

## 2017-01-27 DIAGNOSIS — L659 Nonscarring hair loss, unspecified: Secondary | ICD-10-CM | POA: Diagnosis not present

## 2017-01-27 DIAGNOSIS — E063 Autoimmune thyroiditis: Secondary | ICD-10-CM | POA: Diagnosis not present

## 2017-01-27 DIAGNOSIS — Z6822 Body mass index (BMI) 22.0-22.9, adult: Secondary | ICD-10-CM | POA: Diagnosis not present

## 2017-01-27 DIAGNOSIS — C50911 Malignant neoplasm of unspecified site of right female breast: Secondary | ICD-10-CM | POA: Diagnosis not present

## 2017-05-01 ENCOUNTER — Emergency Department (HOSPITAL_COMMUNITY): Payer: Medicare Other

## 2017-05-01 ENCOUNTER — Emergency Department (HOSPITAL_COMMUNITY)
Admission: EM | Admit: 2017-05-01 | Discharge: 2017-05-01 | Disposition: A | Discharge status: Home / Self Care | Payer: Medicare Other | Attending: Emergency Medicine | Admitting: Emergency Medicine

## 2017-05-01 ENCOUNTER — Encounter (HOSPITAL_COMMUNITY): Payer: Self-pay

## 2017-05-01 DIAGNOSIS — R079 Chest pain, unspecified: Secondary | ICD-10-CM

## 2017-05-01 DIAGNOSIS — Z87891 Personal history of nicotine dependence: Secondary | ICD-10-CM | POA: Insufficient documentation

## 2017-05-01 DIAGNOSIS — N183 Chronic kidney disease, stage 3 (moderate): Secondary | ICD-10-CM | POA: Diagnosis not present

## 2017-05-01 DIAGNOSIS — E039 Hypothyroidism, unspecified: Secondary | ICD-10-CM | POA: Insufficient documentation

## 2017-05-01 DIAGNOSIS — R072 Precordial pain: Secondary | ICD-10-CM | POA: Diagnosis present

## 2017-05-01 DIAGNOSIS — K8070 Calculus of gallbladder and bile duct without cholecystitis without obstruction: Secondary | ICD-10-CM | POA: Diagnosis not present

## 2017-05-01 DIAGNOSIS — M549 Dorsalgia, unspecified: Secondary | ICD-10-CM | POA: Diagnosis not present

## 2017-05-01 DIAGNOSIS — Z853 Personal history of malignant neoplasm of breast: Secondary | ICD-10-CM | POA: Diagnosis not present

## 2017-05-01 DIAGNOSIS — I129 Hypertensive chronic kidney disease with stage 1 through stage 4 chronic kidney disease, or unspecified chronic kidney disease: Secondary | ICD-10-CM | POA: Insufficient documentation

## 2017-05-01 DIAGNOSIS — Z79899 Other long term (current) drug therapy: Secondary | ICD-10-CM | POA: Diagnosis not present

## 2017-05-01 DIAGNOSIS — R071 Chest pain on breathing: Secondary | ICD-10-CM | POA: Diagnosis not present

## 2017-05-01 LAB — CBC WITH DIFFERENTIAL/PLATELET
BASOS ABS: 0.1 10*3/uL (ref 0.0–0.1)
Basophils Relative: 1 %
EOS PCT: 5 %
Eosinophils Absolute: 0.5 10*3/uL (ref 0.0–0.7)
HEMATOCRIT: 37.6 % (ref 36.0–46.0)
Hemoglobin: 12.2 g/dL (ref 12.0–15.0)
LYMPHS PCT: 17 %
Lymphs Abs: 1.7 10*3/uL (ref 0.7–4.0)
MCH: 29.7 pg (ref 26.0–34.0)
MCHC: 32.4 g/dL (ref 30.0–36.0)
MCV: 91.5 fL (ref 78.0–100.0)
MONO ABS: 1.1 10*3/uL — AB (ref 0.1–1.0)
MONOS PCT: 11 %
NEUTROS ABS: 6.7 10*3/uL (ref 1.7–7.7)
Neutrophils Relative %: 66 %
PLATELETS: 189 10*3/uL (ref 150–400)
RBC: 4.11 MIL/uL (ref 3.87–5.11)
RDW: 12.9 % (ref 11.5–15.5)
WBC: 10 10*3/uL (ref 4.0–10.5)

## 2017-05-01 LAB — LIPASE, BLOOD: Lipase: 40 U/L (ref 11–51)

## 2017-05-01 LAB — HEPATIC FUNCTION PANEL
ALK PHOS: 65 U/L (ref 38–126)
ALT: 22 U/L (ref 14–54)
AST: 50 U/L — ABNORMAL HIGH (ref 15–41)
Albumin: 4 g/dL (ref 3.5–5.0)
BILIRUBIN INDIRECT: 0.5 mg/dL (ref 0.3–0.9)
Bilirubin, Direct: 0.1 mg/dL (ref 0.1–0.5)
TOTAL PROTEIN: 6.9 g/dL (ref 6.5–8.1)
Total Bilirubin: 0.6 mg/dL (ref 0.3–1.2)

## 2017-05-01 LAB — I-STAT TROPONIN, ED
Troponin i, poc: 0 ng/mL (ref 0.00–0.08)
Troponin i, poc: 0 ng/mL (ref 0.00–0.08)

## 2017-05-01 LAB — BASIC METABOLIC PANEL
Anion gap: 8 (ref 5–15)
BUN: 28 mg/dL — AB (ref 6–20)
CALCIUM: 9.7 mg/dL (ref 8.9–10.3)
CO2: 27 mmol/L (ref 22–32)
CREATININE: 1.35 mg/dL — AB (ref 0.44–1.00)
Chloride: 104 mmol/L (ref 101–111)
GFR calc Af Amer: 39 mL/min — ABNORMAL LOW (ref >60)
GFR, EST NON AFRICAN AMERICAN: 34 mL/min — AB (ref >60)
GLUCOSE: 112 mg/dL — AB (ref 65–99)
Potassium: 4.4 mmol/L (ref 3.5–5.1)
Sodium: 139 mmol/L (ref 135–145)

## 2017-05-01 MED ORDER — ASPIRIN 81 MG PO CHEW
324.0000 mg | CHEWABLE_TABLET | Freq: Once | ORAL | Status: AC
  Administered 2017-05-01: 324 mg via ORAL
  Filled 2017-05-01: qty 4

## 2017-05-01 MED ORDER — FAMOTIDINE 20 MG PO TABS
20.0000 mg | ORAL_TABLET | Freq: Once | ORAL | Status: AC
  Administered 2017-05-01: 20 mg via ORAL
  Filled 2017-05-01: qty 1

## 2017-05-01 MED ORDER — SODIUM CHLORIDE 0.9 % IV SOLN
INTRAVENOUS | Status: DC
  Administered 2017-05-01: 09:00:00 via INTRAVENOUS

## 2017-05-01 MED ORDER — IOPAMIDOL (ISOVUE-370) INJECTION 76%
80.0000 mL | Freq: Once | INTRAVENOUS | Status: AC | PRN
  Administered 2017-05-01: 80 mL via INTRAVENOUS

## 2017-05-01 MED ORDER — HYDROCODONE-ACETAMINOPHEN 5-325 MG PO TABS: 1.0000 | ORAL_TABLET | ORAL | 0 refills | Status: DC | PRN

## 2017-05-01 MED ORDER — SODIUM CHLORIDE 0.9 % IV BOLUS (SEPSIS)
500.0000 mL | Freq: Once | INTRAVENOUS | Status: AC
  Administered 2017-05-01: 500 mL via INTRAVENOUS

## 2017-05-01 MED ORDER — FENTANYL CITRATE (PF) 100 MCG/2ML IJ SOLN
40.0000 ug | INTRAMUSCULAR | Status: DC | PRN
Start: 2017-05-01 — End: 2017-05-01
  Administered 2017-05-01: 40 ug via INTRAVENOUS
  Filled 2017-05-01: qty 2

## 2017-05-01 NOTE — ED Triage Notes (Signed)
Pt reports is getting over a cold and woke up with soreness in chest around 0500 this morning.  Pt says pain got worse after eating and hurts worse with deep breaths.  Reports still having productive cough.

## 2017-05-01 NOTE — Discharge Instructions (Signed)
Avoid fatty foods and follow-up with general surgery on Monday or Tuesday. Return to the ER from controlled pain, recurrent chest discomfort, fevers or recurrent vomiting.

## 2017-05-01 NOTE — ED Provider Notes (Signed)
[facility] DEPT Provider Note   CSN: [id number] Arrival date & time: 05/01/17  0867  By signing my name below, I, Jaquelyn Bitter., attest that this documentation has been prepared under the direction and in the presence of Elnora Morrison, MD. Electronically signed: Jaquelyn Bitter., ED Scribe. 05/01/17. 12:40 PM.   History   Chief Complaint Chief Complaint  Patient presents with  . Chest Pain    HPI CHRISTAIN MCRANEY is a 83 y.o. female with hx of gallstones, heart murmur who presents to the Emergency Department complaining of chest pain with onset x3 hours. Pt states that she awoke this morning with substernal chest pain that she states is exacerbated with breathing. She states that after eating breakfast the chest pain became worse. She reports the chest pain radiating to the back. Pt has taken Tums with no relief. She denies diaphoresis, leg swelling, fever, cough, SOB. Pt denies hx of GERD, DVT, HTN, diabetes mellitus. She denies smoking.   The history is provided by the patient. No language interpreter was used.    Past Medical History:  Diagnosis Date  . Abdominal adhesions 1994  . Allergic rhinitis   . Anemia   . Anxiety and depression   . Arthritis   . Atrial fibrillation ([place]) 10/2012   Associated with severe anemia and esophageal pill impaction  . Breast carcinoma ([place])    right mastectomy "25+ years ago"  . Cholelithiasis    asymptomatic  . Gastroesophageal reflux disease    Hiatal hernia  . Hypertension   . Hypothyroid   . Low back pain   . 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 Nephrolithiasis 2004   painless hematuria  . Upper GI bleed 2004   Multiple episodes of melena-? due to gastritis or adverse drug effect (nonsteroidals, small bowel ulceration with Fosamax); caused by Pepto-Bismol during one Emergency Department evaluation    Patient Active Problem List   Diagnosis Date Noted  .  Obstipation 04/12/2016  . Vagal reaction 04/12/2016  . Abdominal pain 09/19/2014  . 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 Ecchymoses, spontaneous 05/31/2013  . Hypothyroid   . Atrial fibrillation ([place])   . Breast carcinoma ([place])   . Malabsorption   . CKD (chronic kidney disease) stage 3, GFR 30-59 ml/min 10/21/2012  . Anemia, normocytic normochromic 07/06/2012  . Chronic diarrhea 02/10/2012  . Hypertension 02/10/2012    Past Surgical History:  Procedure Laterality Date  . ABDOMINAL HYSTERECTOMY     emergency s/p delivery  . ABDOMINAL HYSTERECTOMY  1960   massive gynecologic bleeding  . BOWEL RESECTION     Resulting short gut syndrome  . COLONOSCOPY W/ POLYPECTOMY  2005   Lipoma; diverticulosis  . COLONOSCOPY WITH ESOPHAGOGASTRODUODENOSCOPY (EGD)  11/22/2012   Rehman  . LAPAROSCOPIC LYSIS OF ADHESIONS  1965   s/p adhesions  . MASTECTOMY  right breast  . MASTECTOMY     Carcinoma of the breast; right  . UPPER GASTROINTESTINAL ENDOSCOPY      OB History    No data available       Home Medications    Prior to Admission medications   Medication Sig Start Date End Date Taking? Authorizing Provider  ALPRAZolam Duanne Moron) 1 MG tablet Take 1 mg by mouth at bedtime as needed for sleep.   Yes [provider]  Calcium Carbonate-Vitamin D (CALCIUM + D PO) Take 1 tablet by mouth daily.   Yes [provider]  Cholecalciferol (VITAMIN D PO) Take 1 tablet by mouth daily.   Yes [provider]  Cyanocobalamin (VITAMIN B-12 IJ) Inject as directed every 30 (thirty) days.   Yes [provider]  docusate sodium (COLACE) 100 MG capsule Take 1 capsule (100 mg total) by mouth daily. Patient taking differently: Take 100 mg by mouth daily as needed for mild constipation.  04/13/16  Yes Thurnell Lose, MD  DULoxetine (CYMBALTA) 60 MG capsule Take 60 mg by mouth every morning.    Yes [provider]  folic acid (FOLVITE) 1 MG tablet Take 1 mg by  mouth daily.   Yes [provider]  glucosamine-chondroitin 500-400 MG tablet Take 1 tablet by mouth every morning.    Yes [provider]  hydrochlorothiazide (MICROZIDE) 12.5 MG capsule Take 1 tablet twice daily as needed for swelling. 03/12/16  Yes Delo, Nathaneil Canary, MD  iron polysaccharides (NIFEREX) 150 MG capsule Take 1 capsule (150 mg total) by mouth every Monday, Wednesday, and Friday. Patient taking differently: Take 150 mg by mouth daily.  06/27/13  Yes Rehman, Mechele Dawley, MD  Lactobacillus (ACIDOPHILUS) 100 MG CAPS Take 100 mg by mouth every morning.    Yes [provider]  levothyroxine (SYNTHROID, LEVOTHROID) 75 MCG tablet Take 75 mcg by mouth daily before breakfast.   Yes [provider]  Multiple Vitamin (MULTIVITAMIN) tablet Take 1 tablet by mouth every morning.    Yes [provider]  Omega-3 Fatty Acids (FISH OIL) 1000 MG CAPS Take 1,000 mg by mouth every morning.    Yes [provider]  omeprazole (PRILOSEC) 40 MG capsule Take 1 capsule (40 mg total) by mouth daily. 11/26/16  Yes Setzer, Terri L, NP  polyethylene glycol (MIRALAX / GLYCOLAX) packet Take 17 g by mouth daily. Patient taking differently: Take 17 g by mouth as needed.  04/13/16  Yes Thurnell Lose, MD  HYDROcodone-acetaminophen (NORCO) 5-325 MG tablet Take 1 tablet by mouth every 4 (four) hours as needed. 05/01/17   Elnora Morrison, MD    Family History Family History  Problem Relation Age of Onset  . Anuerysm Father   . Rheum arthritis Sister   . Healthy Sister   . COPD Sister   . Healthy Brother   . Cancer Unknown   . Colon cancer Neg Hx     Social History Social History  Substance Use Topics  . Smoking status: Former Smoker    Packs/day: 1.50    Years: 20.00    Types: Cigarettes  . Smokeless tobacco: Never Used  . Alcohol use No     Allergies   Codeine   Review of Systems Review of Systems  Constitutional: Negative for diaphoresis and fever.    Respiratory: Negative for cough and shortness of breath.   Cardiovascular: Positive for chest pain. Negative for leg swelling.  All other systems reviewed and are negative.    Physical Exam Updated Vital Signs BP 138/62 (BP Location: Left Arm)   Pulse 78   Temp 97.7 F (36.5 C) (Oral)   Resp 13   Ht 5\' 4"  (1.626 m)   Wt 122 lb (55.3 kg)   SpO2 98%   BMI 20.94 kg/m   Physical Exam  Constitutional: She appears well-developed and well-nourished. No distress.  HENT:  Head: Normocephalic and atraumatic.  Eyes: Conjunctivae are normal.  Neck: Neck supple.  Cardiovascular: Normal rate and regular rhythm.   Murmur heard.  Systolic murmur is present  Pulmonary/Chest: Effort normal and  breath sounds normal. No respiratory distress.  Lungs clear.  Abdominal: Soft. There is tenderness (mild epig).  Musculoskeletal: She exhibits no edema.  2+ pulses in the upper extremities. No leg swelling or calf tenderness.   Neurological: She is alert.  Skin: Skin is warm and dry.  Psychiatric: She has a normal mood and affect.  Nursing note and vitals reviewed.    ED Treatments / Results   DIAGNOSTIC STUDIES: Oxygen Saturation is 100% on RA, normal by my interpretation.   COORDINATION OF CARE: 12:40 PM-Discussed next steps with pt. Pt verbalized understanding and is agreeable with the plan.    Labs (all labs ordered are listed, but only abnormal results are displayed) Labs Reviewed  CBC WITH DIFFERENTIAL/PLATELET - Abnormal; Notable for the following:       Result Value   Monocytes Absolute 1.1 (*)    All other components within normal limits  BASIC METABOLIC PANEL - Abnormal; Notable for the following:    Glucose, Bld 112 (*)    BUN 28 (*)    Creatinine, Ser 1.35 (*)    GFR calc non Af Amer 34 (*)    GFR calc Af Amer 39 (*)    All other components within normal limits  HEPATIC FUNCTION PANEL - Abnormal; Notable for the following:    AST 50 (*)    All other components  within normal limits  LIPASE, BLOOD  I-STAT TROPOININ, ED  I-STAT TROPOININ, ED    EKG  EKG Interpretation  Date/Time:  Friday May 01 2017 08:00:21 EDT Ventricular Rate:  76 PR Interval:    QRS Duration: 97 QT Interval:  388 QTC Calculation: 437 R Axis:   -6 Text Interpretation:  Sinus rhythm Ventricular premature complex Abnormal R-wave progression, early transition Confirmed by Reather Converse MD, Gaspare Netzel [phone]) on 05/01/2017 8:09:55 AM       Radiology Dg Chest 2 View  Result Date: 05/01/2017 CLINICAL DATA:  83 year old female with acute chest pain today. EXAM: CHEST  2 VIEW COMPARISON:  10/03/2015 and prior exams FINDINGS: Upper limits normal heart size again noted. There is no evidence of focal airspace disease, pulmonary edema, suspicious pulmonary nodule/mass, pleural effusion, or pneumothorax. No acute bony abnormalities are identified. Right mastectomy changes noted. IMPRESSION: No active cardiopulmonary disease. Electronically Signed   By: Margarette Canada M.D.   On: 05/01/2017 08:52   Ct Angio Chest/abd/pel For Dissection W And/or Wo Contrast  Result Date: 05/01/2017 CLINICAL DATA:  Acute chest and back pain. EXAM: CT ANGIOGRAPHY CHEST, ABDOMEN AND PELVIS TECHNIQUE: Multidetector CT imaging through the chest, abdomen and pelvis was performed using the standard protocol during bolus administration of intravenous contrast. Multiplanar reconstructed images and MIPs were obtained and reviewed to evaluate the vascular anatomy. CONTRAST:  80 mL of Isovue 370 intravenously. COMPARISON:  CT scan of April 12, 2016. FINDINGS: CTA CHEST FINDINGS Cardiovascular: There is no evidence of thoracic aortic dissection or aneurysm. Pulmonary vessels are unremarkable. Normal cardiac size. No pericardial effusion is noted. Great vessels are widely patent without significant stenosis. Mediastinum/Nodes: No enlarged mediastinal, hilar, or axillary lymph nodes. Thyroid gland, trachea, and esophagus demonstrate no  significant findings. Lungs/Pleura: Lungs are clear. No pleural effusion or pneumothorax. Musculoskeletal: No chest wall abnormality. No acute or significant osseous findings. Review of the MIP images confirms the above findings. CTA ABDOMEN AND PELVIS FINDINGS VASCULAR Aorta: There is no evidence of thoracic aortic dissection or aneurysm. Celiac: Atherosclerotic plaque is noted at origin which does not result in significant stenosis. SMA:  Atherosclerotic plaque seen in origin which does not result in significant stenosis. Renals: Both renal arteries are patent without evidence of aneurysm, dissection, vasculitis, fibromuscular dysplasia or significant stenosis. IMA: Patent without evidence of aneurysm, dissection, vasculitis or significant stenosis. Inflow: Patent without evidence of aneurysm, dissection, vasculitis or significant stenosis. Veins: No obvious venous abnormality within the limitations of this arterial phase study. Review of the MIP images confirms the above findings. NON-VASCULAR Hepatobiliary: Dilated gallbladder is noted without inflammation. Mild cholelithiasis is noted. Probable fatty infiltration of the liver is noted with sparing of posterior segment of right hepatic lobe. Pancreas: Unremarkable. No pancreatic ductal dilatation or surrounding inflammatory changes. Spleen: Normal in size without focal abnormality. Adrenals/Urinary Tract: Adrenal glands appear normal. Large left renal cyst is noted. Large staghorn calculus is is noted in right kidney. No hydronephrosis is noted. No ureteral calculi are noted. Urinary bladder is unremarkable. Stomach/Bowel: There is no evidence of bowel obstruction. Large amount of stool seen throughout the colon. No inflammatory findings are noted. The appendix is not clearly visualized, but no inflammation is noted in the right lower quadrant. Lymphatic: No significant adenopathy is noted. Reproductive: Status post hysterectomy. No adnexal masses. Other: No  abdominal wall hernia or abnormality. No abdominopelvic ascites. Musculoskeletal: Multilevel degenerative disc disease is noted in the lumbar spine. No acute abnormality is noted. Review of the MIP images confirms the above findings. IMPRESSION: No evidence of thoracic or abdominal aortic dissection or aneurysm. Aortic atherosclerosis is noted. Great vessels are widely patent. No evidence of significant mesenteric or renal artery stenosis. Dilated gallbladder is noted without inflammation. Mild cholelithiasis is noted. Probable fatty infiltration of the liver. Stool is noted throughout the colon. Multilevel degenerative disc disease seen in the lumbar spine. Electronically Signed   By: Marijo Conception, M.D.   On: 05/01/2017 09:42    Procedures Procedures (including critical care time)  Medications Ordered in ED Medications  0.9 %  sodium chloride infusion ( Intravenous Stopped 05/01/17 1105)  fentaNYL (SUBLIMAZE) injection 40 mcg (40 mcg Intravenous Given 05/01/17 0837)  aspirin chewable tablet 324 mg (324 mg Oral Given 05/01/17 0836)  famotidine (PEPCID) tablet 20 mg (20 mg Oral Given 05/01/17 0836)  iopamidol (ISOVUE-370) 76 % injection 80 mL (80 mLs Intravenous Contrast Given 05/01/17 0850)  sodium chloride 0.9 % bolus 500 mL (0 mLs Intravenous Stopped 05/01/17 1104)     Initial Impression / Assessment and Plan / ED Course  I have reviewed the triage vital signs and the nursing notes.  Pertinent labs & imaging results that were available during my care of the patient were reviewed by me and considered in my medical decision making (see chart for details).    Patient presents with severe epigastric and lower chest discomfort with mild radiation to the back. CT scan unremarkable except for known gallstones and mild dilated gallbladder. Patient has no right upper quadrant tenderness, no fever. No signs of infection. Discussed this may be due to her gallbladder however she has been told she is not a  great surgical candidate with her age. We discussed avoiding fatty foods, delta troponin follow-up with general surgery on Monday or Tuesday. Patient comfortable this plan. Pain is controlled on reassessment.  Results and differential diagnosis were discussed with the patient/parent/guardian. Xrays were independently reviewed by myself.  Close follow up outpatient was discussed, comfortable with the plan.   Medications  0.9 %  sodium chloride infusion ( Intravenous Stopped 05/01/17 1105)  fentaNYL (SUBLIMAZE) injection 40 mcg (40  mcg Intravenous Given 05/01/17 0837)  aspirin chewable tablet 324 mg (324 mg Oral Given 05/01/17 0836)  famotidine (PEPCID) tablet 20 mg (20 mg Oral Given 05/01/17 0836)  iopamidol (ISOVUE-370) 76 % injection 80 mL (80 mLs Intravenous Contrast Given 05/01/17 0850)  sodium chloride 0.9 % bolus 500 mL (0 mLs Intravenous Stopped 05/01/17 1104)    Vitals:   05/01/17 0930 05/01/17 1020 05/01/17 1104 05/01/17 1223  BP: 129/61 127/65 134/86 138/62  Pulse: 77 85 86 78  Resp: 17 17 18 13   Temp:      TempSrc:      SpO2: 100% 98% 98% 98%  Weight:      Height:        Final diagnoses:  Calculus of gallbladder and bile duct without cholecystitis or obstruction  Acute chest pain     Final Clinical Impressions(s) / ED Diagnoses   Final diagnoses:  Calculus of gallbladder and bile duct without cholecystitis or obstruction  Acute chest pain    New Prescriptions New Prescriptions   HYDROCODONE-ACETAMINOPHEN (NORCO) 5-325 MG TABLET    Take 1 tablet by mouth every 4 (four) hours as needed.      Elnora Morrison, MD 05/01/17 1240

## 2017-05-04 ENCOUNTER — Telehealth (INDEPENDENT_AMBULATORY_CARE_PROVIDER_SITE_OTHER): Payer: Self-pay | Admitting: Internal Medicine

## 2017-05-04 NOTE — Telephone Encounter (Signed)
I will address this with Dr.Rehman. It may be tomorrow,05/05/2017.

## 2017-05-04 NOTE — Telephone Encounter (Signed)
Patient presented to the office, stated that she went to the ED at [facility] on 05/01/17 with severe pain.  She stated that they told her she has issues gallbladder issues and needs to see surgeon in three days.  She does not want to see anyone until she speaks to Dr. Laural Golden.  I did let her know that he had a family emergency and is not currently available.  [phone]

## 2017-05-26 ENCOUNTER — Encounter (INDEPENDENT_AMBULATORY_CARE_PROVIDER_SITE_OTHER): Payer: Self-pay | Admitting: Internal Medicine

## 2017-05-26 ENCOUNTER — Ambulatory Visit (INDEPENDENT_AMBULATORY_CARE_PROVIDER_SITE_OTHER): Payer: Medicare Other | Admitting: Internal Medicine

## 2017-05-26 VITALS — BP 150/80 | HR 60 | Temp 97.0°F | Ht 63.0 in | Wt 129.0 lb

## 2017-05-26 DIAGNOSIS — R1013 Epigastric pain: Secondary | ICD-10-CM | POA: Diagnosis not present

## 2017-05-26 LAB — CBC WITH DIFFERENTIAL/PLATELET
BASOS ABS: 0 {cells}/uL (ref 0–200)
Basophils Relative: 0 %
EOS ABS: 830 {cells}/uL — AB (ref 15–500)
EOS PCT: 10 %
HCT: 35.4 % (ref 35.0–45.0)
HEMOGLOBIN: 11.4 g/dL — AB (ref 11.7–15.5)
LYMPHS ABS: 1328 {cells}/uL (ref 850–3900)
Lymphocytes Relative: 16 %
MCH: 28.9 pg (ref 27.0–33.0)
MCHC: 32.2 g/dL (ref 32.0–36.0)
MCV: 89.8 fL (ref 80.0–100.0)
MONOS PCT: 13 %
MPV: 9.9 fL (ref 7.5–12.5)
Monocytes Absolute: 1079 cells/uL — ABNORMAL HIGH (ref 200–950)
Neutro Abs: 5063 cells/uL (ref 1500–7800)
Neutrophils Relative %: 61 %
Platelets: 166 10*3/uL (ref 140–400)
RBC: 3.94 MIL/uL (ref 3.80–5.10)
RDW: 13.5 % (ref 11.0–15.0)
WBC: 8.3 10*3/uL (ref 3.8–10.8)

## 2017-05-26 LAB — HEPATIC FUNCTION PANEL
ALBUMIN: 3.9 g/dL (ref 3.6–5.1)
ALT: 14 U/L (ref 6–29)
AST: 15 U/L (ref 10–35)
Alkaline Phosphatase: 65 U/L (ref 33–130)
BILIRUBIN TOTAL: 0.4 mg/dL (ref 0.2–1.2)
Bilirubin, Direct: 0.1 mg/dL (ref <=0.2)
Indirect Bilirubin: 0.3 mg/dL (ref 0.2–1.2)
TOTAL PROTEIN: 6.2 g/dL (ref 6.1–8.1)

## 2017-05-26 NOTE — Patient Instructions (Signed)
CBC, Hepatic function

## 2017-05-26 NOTE — Progress Notes (Signed)
Subjective:    Patient ID: Jane Andrews, female    DOB: 1925/06/08, 83 y.o.   MRN: [id number]  HPI Presents today with epigastric pain radiating into her back. The pain lasted for about 24 hrs. She says as soon as she received pain medication, she has not had any pain since.   She was seen in the ED 05/01/2017. She was told it may be her GB. She was told to see a Psychologist, sport and exercise within 3 days. She has not seen a Psychologist, sport and exercise. . She says since her ED visit, she has not had any pain. Appetite is okay. No weight loss.  BMs are normal. No melena or BRRB    05/01/2017 Ct angio chest/abd/pelvis for Dissection w/ wo com IMPRESSION: No evidence of thoracic or abdominal aortic dissection or aneurysm. Aortic atherosclerosis is noted.  Great vessels are widely patent. No evidence of significant mesenteric or renal artery stenosis.  Dilated gallbladder is noted without inflammation. Mild cholelithiasis is noted.  Probable fatty infiltration of the liver.  Stool is noted throughout the colon.  Multilevel degenerative disc disease seen in the lumbar spine.  CMP Latest Ref Rng & Units 05/01/2017 04/11/2016 03/12/2016  Glucose 65 - 99 mg/dL 112(H) 96 97  BUN 6 - 20 mg/dL 28(H) 18 24(H)  Creatinine 0.44 - 1.00 mg/dL 1.35(H) 0.98 0.97  Sodium 135 - 145 mmol/L 139 142 143  Potassium 3.5 - 5.1 mmol/L 4.4 3.5 3.2(L)  Chloride 101 - 111 mmol/L 104 109 110  CO2 22 - 32 mmol/L 27 22 24   Calcium 8.9 - 10.3 mg/dL 9.7 9.2 8.7(L)  Total Protein 6.5 - 8.1 g/dL 6.9 7.0 -  Total Bilirubin 0.3 - 1.2 mg/dL 0.6 0.7 -  Alkaline Phos 38 - 126 U/L 65 55 -  AST 15 - 41 U/L 50(H) 16 -  ALT 14 - 54 U/L 22 15 -   CBC    Component Value Date/Time   WBC 10.0 05/01/2017 0803   RBC 4.11 05/01/2017 0803   HGB 12.2 05/01/2017 0803   HCT 37.6 05/01/2017 0803   PLT 189 05/01/2017 0803   MCV 91.5 05/01/2017 0803   MCH 29.7 05/01/2017 0803   MCHC 32.4 05/01/2017 0803   RDW 12.9 05/01/2017 0803   LYMPHSABS 1.7  05/01/2017 0803   MONOABS 1.1 (H) 05/01/2017 0803   EOSABS 0.5 05/01/2017 0803   BASOSABS 0.1 05/01/2017 0803       04/12/2017 CT abdomen/pelvis with CM:   IMPRESSION: 1. Large volume stool suggesting constipation. No obstruction or impaction. 2. Cholelithiasis without cholecystitis. 3. Nephrolithiasis including staghorn calculus on the right. 4. Additional incidental findings remain stable     Review of Systems Current Outpatient Prescriptions on File Prior to Visit  Medication Sig Dispense Refill  . ALPRAZolam (XANAX) 1 MG tablet Take 1 mg by mouth at bedtime as needed for sleep.    . Calcium Carbonate-Vitamin D (CALCIUM + D PO) Take 1 tablet by mouth daily.    . Cholecalciferol (VITAMIN D PO) Take 1 tablet by mouth daily.    . Cyanocobalamin (VITAMIN B-12 IJ) Inject as directed every 30 (thirty) days.    Marland Kitchen docusate sodium (COLACE) 100 MG capsule Take 1 capsule (100 mg total) by mouth daily. (Patient taking differently: Take 100 mg by mouth daily as needed for mild constipation. ) 30 capsule 0  . DULoxetine (CYMBALTA) 60 MG capsule Take 60 mg by mouth every morning.     . folic acid (FOLVITE) 1 MG tablet  Take 1 mg by mouth daily.    Marland Kitchen glucosamine-chondroitin 500-400 MG tablet Take 1 tablet by mouth every morning.     Marland Kitchen HYDROcodone-acetaminophen (NORCO) 5-325 MG tablet Take 1 tablet by mouth every 4 (four) hours as needed. 6 tablet 0  . iron polysaccharides (NIFEREX) 150 MG capsule Take 1 capsule (150 mg total) by mouth every Monday, Wednesday, and Friday. (Patient taking differently: Take 150 mg by mouth daily. ) 30 capsule 5  . Lactobacillus (ACIDOPHILUS) 100 MG CAPS Take 100 mg by mouth every morning.     Marland Kitchen levothyroxine (SYNTHROID, LEVOTHROID) 75 MCG tablet Take 75 mcg by mouth daily before breakfast.    . Multiple Vitamin (MULTIVITAMIN) tablet Take 1 tablet by mouth every morning.     . Omega-3 Fatty Acids (FISH OIL) 1000 MG CAPS Take 1,000 mg by mouth every morning.       Marland Kitchen omeprazole (PRILOSEC) 40 MG capsule Take 1 capsule (40 mg total) by mouth daily. 90 capsule 3  . polyethylene glycol (MIRALAX / GLYCOLAX) packet Take 17 g by mouth daily. (Patient taking differently: Take 17 g by mouth as needed. ) 30 each 0   No current facility-administered medications on file prior to visit.        Objective:   Physical Exam Blood pressure (!) 150/80, pulse 60, temperature 97 F (36.1 C), height 5\' 3"  (1.6 m), weight 129 lb (58.5 kg). Alert and oriented. Skin warm and dry. Oral mucosa is moist.   . Sclera anicteric, conjunctivae is pink. Thyroid not enlarged. No cervical lymphadenopathy. Lungs clear. Heart regular rate and rhythm.  Abdomen is soft. Bowel sounds are positive. No hepatomegaly. No abdominal masses felt. No tenderness.  No edema to lower extremities.           Assessment & Plan:  Epigastric pain. Cholelithiasis.. Asymptomatic at this time. Normal liver enzymes. Will get an Hepatic function and CBC  today.

## 2017-05-29 ENCOUNTER — Ambulatory Visit: Payer: Medicare Other | Admitting: Orthopedic Surgery

## 2017-06-01 ENCOUNTER — Ambulatory Visit (INDEPENDENT_AMBULATORY_CARE_PROVIDER_SITE_OTHER): Payer: Medicare Other | Admitting: Orthopedic Surgery

## 2017-06-01 ENCOUNTER — Encounter: Payer: Self-pay | Admitting: Orthopedic Surgery

## 2017-06-01 ENCOUNTER — Ambulatory Visit (INDEPENDENT_AMBULATORY_CARE_PROVIDER_SITE_OTHER): Payer: Medicare Other

## 2017-06-01 VITALS — BP 129/84 | HR 86 | Ht 62.0 in | Wt 125.0 lb

## 2017-06-01 DIAGNOSIS — M545 Low back pain, unspecified: Secondary | ICD-10-CM

## 2017-06-01 DIAGNOSIS — M5137 Other intervertebral disc degeneration, lumbosacral region: Secondary | ICD-10-CM

## 2017-06-01 MED ORDER — DICLOFENAC POTASSIUM 50 MG PO TABS: 50.0000 mg | ORAL_TABLET | Freq: Two times a day (BID) | ORAL | 3 refills | Status: DC

## 2017-06-01 NOTE — Progress Notes (Signed)
NEW PATIENT OFFICE VISIT    Chief Complaint  Patient presents with  . Back Pain    back pain.    83 year old female presents with known history of degenerative disc disease status post epidural injections by Dr. Sherwood Gambler many years ago presents now with left-sided buttock pain and back pain without radiating pain to the leg or foot she has not taken any medications at this time  Her pain is approximately 3 weeks it's a dull aching pain on the left side    Review of Systems  Constitutional: Negative for chills, fever, malaise/fatigue and weight loss.  Gastrointestinal: Negative.   Genitourinary: Negative.      Past Medical History:  Diagnosis Date  . Abdominal adhesions 1994  . Allergic rhinitis   . Anemia   . Anxiety and depression   . Arthritis   . Atrial fibrillation ([place]) 10/2012   Associated with severe anemia and esophageal pill impaction  . Breast carcinoma ([place])    right mastectomy "25+ years ago"  . Cholelithiasis    asymptomatic  . Gastroesophageal reflux disease    Hiatal hernia  . Hypertension   . Hypothyroid   . Low back pain   . 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 Nephrolithiasis 2004   painless hematuria  . Upper GI bleed 2004   Multiple episodes of melena-? due to gastritis or adverse drug effect (nonsteroidals, small bowel ulceration with Fosamax); caused by Pepto-Bismol during one Emergency Department evaluation    Past Surgical History:  Procedure Laterality Date  . ABDOMINAL HYSTERECTOMY     emergency s/p delivery  . ABDOMINAL HYSTERECTOMY  1960   massive gynecologic bleeding  . BOWEL RESECTION     Resulting short gut syndrome  . COLONOSCOPY W/ POLYPECTOMY  2005   Lipoma; diverticulosis  . COLONOSCOPY WITH ESOPHAGOGASTRODUODENOSCOPY (EGD)  11/22/2012   Rehman  . LAPAROSCOPIC LYSIS OF ADHESIONS  1965   s/p adhesions  . MASTECTOMY  right breast  . MASTECTOMY     Carcinoma of the breast;  right  . UPPER GASTROINTESTINAL ENDOSCOPY      Family History  Problem Relation Age of Onset  . Anuerysm Father   . Rheum arthritis Sister   . Healthy Sister   . COPD Sister   . Healthy Brother   . Cancer Unknown   . Colon cancer Neg Hx    Social History  Substance Use Topics  . Smoking status: Former Smoker    Packs/day: 1.50    Years: 20.00    Types: Cigarettes  . Smokeless tobacco: Never Used  . Alcohol use No    BP 129/84   Pulse 86   Ht 5\' 2"  (1.575 m)   Wt 125 lb (56.7 kg)   BMI 22.86 kg/m   Physical Exam  Constitutional: She is oriented to person, place, and time. She appears well-developed and well-nourished.  Neurological: She is alert and oriented to person, place, and time. Gait normal. She displays no Babinski's sign on the right side. She displays no Babinski's sign on the left side.  Reflex Scores:      Patellar reflexes are 2+ on the right side and 2+ on the left side.      Achilles reflexes are 2+ on the right side and 0 on the left side. Psychiatric: She has a normal mood and affect.  Vitals reviewed.   Ortho Exam   Normal hip flexion right to left no hip tenderness  right to left  Lumbar spine tender L4-5 interspace and L5-S1 interspace left and right SI joint and left buttock   Right and left lower extremity exam Motor exam on the right and left showed normal strength in her legs dorsiflexion plantar flexion knee extension and hip flexion and knee flexion. Hip and knee stable. Skin normal on both sides    Meds ordered this encounter  Medications  . diclofenac (CATAFLAM) 50 MG tablet    Sig: Take 1 tablet (50 mg total) by mouth 2 (two) times daily.    Dispense:  90 tablet    Refill:  3    Encounter Diagnoses  Name Primary?  . Left-sided low back pain without sciatica, unspecified chronicity Yes  . DDD (degenerative disc disease), lumbosacral      PLAN:

## 2017-06-11 ENCOUNTER — Telehealth: Payer: Self-pay | Admitting: Orthopedic Surgery

## 2017-06-11 NOTE — Telephone Encounter (Signed)
ROUTING TO DR HARRISON 

## 2017-06-11 NOTE — Telephone Encounter (Signed)
Patient is asking if there is anything else Dr. Aline Brochure can prescribe besides the Diclofenac. She stated that it has helped some, but she is still hurting especially going up or down steps.  Please advise.

## 2017-06-12 NOTE — Telephone Encounter (Signed)
Not but a knee sleeve may help

## 2017-08-18 DIAGNOSIS — M4155 Other secondary scoliosis, thoracolumbar region: Secondary | ICD-10-CM | POA: Diagnosis not present

## 2017-08-18 DIAGNOSIS — M5136 Other intervertebral disc degeneration, lumbar region: Secondary | ICD-10-CM | POA: Diagnosis not present

## 2017-08-18 DIAGNOSIS — M47816 Spondylosis without myelopathy or radiculopathy, lumbar region: Secondary | ICD-10-CM | POA: Diagnosis not present

## 2017-08-18 DIAGNOSIS — M48062 Spinal stenosis, lumbar region with neurogenic claudication: Secondary | ICD-10-CM | POA: Diagnosis not present

## 2017-08-18 DIAGNOSIS — M546 Pain in thoracic spine: Secondary | ICD-10-CM | POA: Diagnosis not present

## 2017-08-18 DIAGNOSIS — R030 Elevated blood-pressure reading, without diagnosis of hypertension: Secondary | ICD-10-CM | POA: Diagnosis not present

## 2017-08-24 ENCOUNTER — Emergency Department (HOSPITAL_COMMUNITY): Payer: Medicare Other

## 2017-08-24 ENCOUNTER — Emergency Department (HOSPITAL_COMMUNITY)
Admission: EM | Admit: 2017-08-24 | Discharge: 2017-08-24 | Disposition: A | Discharge status: Home / Self Care | Payer: Medicare Other | Attending: Emergency Medicine | Admitting: Emergency Medicine

## 2017-08-24 ENCOUNTER — Encounter (HOSPITAL_COMMUNITY): Payer: Self-pay

## 2017-08-24 DIAGNOSIS — Z87891 Personal history of nicotine dependence: Secondary | ICD-10-CM | POA: Insufficient documentation

## 2017-08-24 DIAGNOSIS — R079 Chest pain, unspecified: Secondary | ICD-10-CM | POA: Diagnosis not present

## 2017-08-24 DIAGNOSIS — S2222XA Fracture of body of sternum, initial encounter for closed fracture: Secondary | ICD-10-CM

## 2017-08-24 DIAGNOSIS — Y939 Activity, unspecified: Secondary | ICD-10-CM | POA: Diagnosis not present

## 2017-08-24 DIAGNOSIS — I129 Hypertensive chronic kidney disease with stage 1 through stage 4 chronic kidney disease, or unspecified chronic kidney disease: Secondary | ICD-10-CM | POA: Diagnosis not present

## 2017-08-24 DIAGNOSIS — Y999 Unspecified external cause status: Secondary | ICD-10-CM | POA: Diagnosis not present

## 2017-08-24 DIAGNOSIS — N183 Chronic kidney disease, stage 3 (moderate): Secondary | ICD-10-CM | POA: Diagnosis not present

## 2017-08-24 DIAGNOSIS — S2220XA Unspecified fracture of sternum, initial encounter for closed fracture: Secondary | ICD-10-CM | POA: Insufficient documentation

## 2017-08-24 DIAGNOSIS — E039 Hypothyroidism, unspecified: Secondary | ICD-10-CM | POA: Diagnosis not present

## 2017-08-24 DIAGNOSIS — S0990XA Unspecified injury of head, initial encounter: Secondary | ICD-10-CM | POA: Diagnosis not present

## 2017-08-24 DIAGNOSIS — Y92410 Unspecified street and highway as the place of occurrence of the external cause: Secondary | ICD-10-CM | POA: Diagnosis not present

## 2017-08-24 DIAGNOSIS — S20309A Unspecified superficial injuries of unspecified front wall of thorax, initial encounter: Secondary | ICD-10-CM | POA: Diagnosis present

## 2017-08-24 DIAGNOSIS — Z853 Personal history of malignant neoplasm of breast: Secondary | ICD-10-CM | POA: Diagnosis not present

## 2017-08-24 DIAGNOSIS — Z79899 Other long term (current) drug therapy: Secondary | ICD-10-CM | POA: Insufficient documentation

## 2017-08-24 DIAGNOSIS — S199XXA Unspecified injury of neck, initial encounter: Secondary | ICD-10-CM | POA: Diagnosis not present

## 2017-08-24 LAB — BASIC METABOLIC PANEL
ANION GAP: 10 (ref 5–15)
BUN: 28 mg/dL — ABNORMAL HIGH (ref 6–20)
CALCIUM: 9.5 mg/dL (ref 8.9–10.3)
CO2: 22 mmol/L (ref 22–32)
CREATININE: 1.23 mg/dL — AB (ref 0.44–1.00)
Chloride: 111 mmol/L (ref 101–111)
GFR, EST AFRICAN AMERICAN: 44 mL/min — AB (ref >60)
GFR, EST NON AFRICAN AMERICAN: 38 mL/min — AB (ref >60)
Glucose, Bld: 124 mg/dL — ABNORMAL HIGH (ref 65–99)
Potassium: 4.1 mmol/L (ref 3.5–5.1)
SODIUM: 143 mmol/L (ref 135–145)

## 2017-08-24 LAB — CBC
HEMATOCRIT: 33.8 % — AB (ref 36.0–46.0)
HEMOGLOBIN: 10.8 g/dL — AB (ref 12.0–15.0)
MCH: 29.6 pg (ref 26.0–34.0)
MCHC: 32 g/dL (ref 30.0–36.0)
MCV: 92.6 fL (ref 78.0–100.0)
Platelets: 179 10*3/uL (ref 150–400)
RBC: 3.65 MIL/uL — ABNORMAL LOW (ref 3.87–5.11)
RDW: 13.7 % (ref 11.5–15.5)
WBC: 7 10*3/uL (ref 4.0–10.5)

## 2017-08-24 LAB — TROPONIN I

## 2017-08-24 MED ORDER — ACETAMINOPHEN 325 MG PO TABS
650.0000 mg | ORAL_TABLET | Freq: Once | ORAL | Status: AC
  Administered 2017-08-24: 650 mg via ORAL
  Filled 2017-08-24: qty 2

## 2017-08-24 MED ORDER — MORPHINE SULFATE (PF) 2 MG/ML IV SOLN
1.0000 mg | Freq: Once | INTRAVENOUS | Status: AC
  Administered 2017-08-24: 1 mg via INTRAVENOUS
  Filled 2017-08-24: qty 1

## 2017-08-24 MED ORDER — IOPAMIDOL (ISOVUE-300) INJECTION 61%
60.0000 mL | Freq: Once | INTRAVENOUS | Status: AC | PRN
  Administered 2017-08-24: 60 mL via INTRAVENOUS

## 2017-08-24 MED ORDER — OXYCODONE-ACETAMINOPHEN 5-325 MG PO TABS: 1.0000 | ORAL_TABLET | ORAL | 0 refills | Status: DC | PRN

## 2017-08-24 MED ORDER — OXYCODONE-ACETAMINOPHEN 5-325 MG PO TABS
1.0000 | ORAL_TABLET | Freq: Once | ORAL | Status: AC
  Administered 2017-08-24: 1 via ORAL
  Filled 2017-08-24: qty 1

## 2017-08-24 NOTE — Discharge Instructions (Signed)
Use incentive spirometer every hour for 6 breaths while awake.  Return if worse, cough, fever, or short of breath.

## 2017-08-24 NOTE — ED Notes (Signed)
EKG given to dr. Jeanell Sparrow.

## 2017-08-24 NOTE — ED Notes (Signed)
Patient placed in c-colar.

## 2017-08-24 NOTE — ED Notes (Signed)
Patient requesting more pain medication, EDP made aware.

## 2017-08-24 NOTE — ED Notes (Signed)
ED Provider at bedside. 
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
## 2017-08-24 NOTE — ED Notes (Signed)
Patient requesting something for pain. EDP gave verbal orders.
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
## 2017-08-26 DIAGNOSIS — Z23 Encounter for immunization: Secondary | ICD-10-CM | POA: Diagnosis not present

## 2017-08-26 DIAGNOSIS — E063 Autoimmune thyroiditis: Secondary | ICD-10-CM | POA: Diagnosis not present

## 2017-08-26 DIAGNOSIS — C50919 Malignant neoplasm of unspecified site of unspecified female breast: Secondary | ICD-10-CM | POA: Diagnosis not present

## 2017-08-26 DIAGNOSIS — Z1389 Encounter for screening for other disorder: Secondary | ICD-10-CM | POA: Diagnosis not present

## 2017-08-26 DIAGNOSIS — M810 Age-related osteoporosis without current pathological fracture: Secondary | ICD-10-CM | POA: Diagnosis not present

## 2017-08-26 DIAGNOSIS — I4891 Unspecified atrial fibrillation: Secondary | ICD-10-CM | POA: Diagnosis not present

## 2017-08-26 DIAGNOSIS — Z0001 Encounter for general adult medical examination with abnormal findings: Secondary | ICD-10-CM | POA: Diagnosis not present

## 2017-08-26 DIAGNOSIS — Z6821 Body mass index (BMI) 21.0-21.9, adult: Secondary | ICD-10-CM | POA: Diagnosis not present

## 2017-08-26 DIAGNOSIS — I10 Essential (primary) hypertension: Secondary | ICD-10-CM | POA: Diagnosis not present

## 2017-08-26 DIAGNOSIS — S2220XA Unspecified fracture of sternum, initial encounter for closed fracture: Secondary | ICD-10-CM | POA: Diagnosis not present

## 2017-08-26 DIAGNOSIS — F419 Anxiety disorder, unspecified: Secondary | ICD-10-CM | POA: Diagnosis not present

## 2017-08-27 DIAGNOSIS — I2510 Atherosclerotic heart disease of native coronary artery without angina pectoris: Secondary | ICD-10-CM | POA: Diagnosis not present

## 2017-08-27 DIAGNOSIS — F419 Anxiety disorder, unspecified: Secondary | ICD-10-CM | POA: Diagnosis not present

## 2017-08-27 DIAGNOSIS — I4891 Unspecified atrial fibrillation: Secondary | ICD-10-CM | POA: Diagnosis not present

## 2017-08-27 DIAGNOSIS — M810 Age-related osteoporosis without current pathological fracture: Secondary | ICD-10-CM | POA: Diagnosis not present

## 2017-08-27 DIAGNOSIS — S2220XD Unspecified fracture of sternum, subsequent encounter for fracture with routine healing: Secondary | ICD-10-CM | POA: Diagnosis not present

## 2017-09-04 DIAGNOSIS — M810 Age-related osteoporosis without current pathological fracture: Secondary | ICD-10-CM | POA: Diagnosis not present

## 2017-09-04 DIAGNOSIS — S2220XD Unspecified fracture of sternum, subsequent encounter for fracture with routine healing: Secondary | ICD-10-CM | POA: Diagnosis not present

## 2017-09-04 DIAGNOSIS — I2510 Atherosclerotic heart disease of native coronary artery without angina pectoris: Secondary | ICD-10-CM | POA: Diagnosis not present

## 2017-09-04 DIAGNOSIS — F419 Anxiety disorder, unspecified: Secondary | ICD-10-CM | POA: Diagnosis not present

## 2017-09-04 DIAGNOSIS — I4891 Unspecified atrial fibrillation: Secondary | ICD-10-CM | POA: Diagnosis not present

## 2017-09-05 DIAGNOSIS — S2220XD Unspecified fracture of sternum, subsequent encounter for fracture with routine healing: Secondary | ICD-10-CM | POA: Diagnosis not present

## 2017-09-05 DIAGNOSIS — M810 Age-related osteoporosis without current pathological fracture: Secondary | ICD-10-CM | POA: Diagnosis not present

## 2017-09-05 DIAGNOSIS — F419 Anxiety disorder, unspecified: Secondary | ICD-10-CM | POA: Diagnosis not present

## 2017-09-05 DIAGNOSIS — I2510 Atherosclerotic heart disease of native coronary artery without angina pectoris: Secondary | ICD-10-CM | POA: Diagnosis not present

## 2017-09-05 DIAGNOSIS — I4891 Unspecified atrial fibrillation: Secondary | ICD-10-CM | POA: Diagnosis not present

## 2017-09-08 DIAGNOSIS — S2220XD Unspecified fracture of sternum, subsequent encounter for fracture with routine healing: Secondary | ICD-10-CM | POA: Diagnosis not present

## 2017-09-08 DIAGNOSIS — F419 Anxiety disorder, unspecified: Secondary | ICD-10-CM | POA: Diagnosis not present

## 2017-09-08 DIAGNOSIS — I2510 Atherosclerotic heart disease of native coronary artery without angina pectoris: Secondary | ICD-10-CM | POA: Diagnosis not present

## 2017-09-08 DIAGNOSIS — M810 Age-related osteoporosis without current pathological fracture: Secondary | ICD-10-CM | POA: Diagnosis not present

## 2017-09-08 DIAGNOSIS — I4891 Unspecified atrial fibrillation: Secondary | ICD-10-CM | POA: Diagnosis not present

## 2017-09-15 DIAGNOSIS — F419 Anxiety disorder, unspecified: Secondary | ICD-10-CM | POA: Diagnosis not present

## 2017-09-15 DIAGNOSIS — M810 Age-related osteoporosis without current pathological fracture: Secondary | ICD-10-CM | POA: Diagnosis not present

## 2017-09-15 DIAGNOSIS — I4891 Unspecified atrial fibrillation: Secondary | ICD-10-CM | POA: Diagnosis not present

## 2017-09-15 DIAGNOSIS — S2220XD Unspecified fracture of sternum, subsequent encounter for fracture with routine healing: Secondary | ICD-10-CM | POA: Diagnosis not present

## 2017-09-15 DIAGNOSIS — I2510 Atherosclerotic heart disease of native coronary artery without angina pectoris: Secondary | ICD-10-CM | POA: Diagnosis not present

## 2017-11-24 ENCOUNTER — Other Ambulatory Visit (INDEPENDENT_AMBULATORY_CARE_PROVIDER_SITE_OTHER): Payer: Self-pay | Admitting: Internal Medicine

## 2017-11-24 DIAGNOSIS — K219 Gastro-esophageal reflux disease without esophagitis: Secondary | ICD-10-CM

## 2018-02-01 DIAGNOSIS — F419 Anxiety disorder, unspecified: Secondary | ICD-10-CM | POA: Diagnosis not present

## 2018-02-01 DIAGNOSIS — R600 Localized edema: Secondary | ICD-10-CM | POA: Diagnosis not present

## 2018-02-01 DIAGNOSIS — Z6820 Body mass index (BMI) 20.0-20.9, adult: Secondary | ICD-10-CM | POA: Diagnosis not present

## 2018-02-01 DIAGNOSIS — J029 Acute pharyngitis, unspecified: Secondary | ICD-10-CM | POA: Diagnosis not present

## 2018-02-16 ENCOUNTER — Telehealth: Payer: Self-pay | Admitting: Orthopedic Surgery

## 2018-02-16 MED ORDER — DICLOFENAC SODIUM 75 MG PO TBEC: 75.0000 mg | DELAYED_RELEASE_TABLET | Freq: Two times a day (BID) | ORAL | 2 refills | Status: DC

## 2018-02-16 NOTE — Telephone Encounter (Signed)
yes

## 2018-02-16 NOTE — Telephone Encounter (Signed)
Diclofenac 50 on backorder, ok to change to 75? Pended.

## 2018-02-16 NOTE — Telephone Encounter (Signed)
Call received - pharmacist Lenna Sciara on line, [facility] has question regarding medication Diclofenac.

## 2018-03-01 ENCOUNTER — Emergency Department (HOSPITAL_COMMUNITY): Payer: Medicare Other

## 2018-03-01 ENCOUNTER — Encounter (HOSPITAL_COMMUNITY): Payer: Self-pay | Admitting: Emergency Medicine

## 2018-03-01 ENCOUNTER — Emergency Department (HOSPITAL_COMMUNITY)
Admission: EM | Admit: 2018-03-01 | Discharge: 2018-03-01 | Disposition: A | Discharge status: Home / Self Care | Payer: Medicare Other | Attending: Emergency Medicine | Admitting: Emergency Medicine

## 2018-03-01 ENCOUNTER — Telehealth (INDEPENDENT_AMBULATORY_CARE_PROVIDER_SITE_OTHER): Payer: Self-pay | Admitting: *Deleted

## 2018-03-01 DIAGNOSIS — Z853 Personal history of malignant neoplasm of breast: Secondary | ICD-10-CM | POA: Insufficient documentation

## 2018-03-01 DIAGNOSIS — I129 Hypertensive chronic kidney disease with stage 1 through stage 4 chronic kidney disease, or unspecified chronic kidney disease: Secondary | ICD-10-CM | POA: Diagnosis not present

## 2018-03-01 DIAGNOSIS — K8020 Calculus of gallbladder without cholecystitis without obstruction: Secondary | ICD-10-CM | POA: Insufficient documentation

## 2018-03-01 DIAGNOSIS — Z79899 Other long term (current) drug therapy: Secondary | ICD-10-CM | POA: Diagnosis not present

## 2018-03-01 DIAGNOSIS — E039 Hypothyroidism, unspecified: Secondary | ICD-10-CM | POA: Insufficient documentation

## 2018-03-01 DIAGNOSIS — N183 Chronic kidney disease, stage 3 (moderate): Secondary | ICD-10-CM | POA: Diagnosis not present

## 2018-03-01 DIAGNOSIS — Z9011 Acquired absence of right breast and nipple: Secondary | ICD-10-CM | POA: Diagnosis not present

## 2018-03-01 DIAGNOSIS — R1011 Right upper quadrant pain: Secondary | ICD-10-CM | POA: Diagnosis not present

## 2018-03-01 DIAGNOSIS — Z87891 Personal history of nicotine dependence: Secondary | ICD-10-CM | POA: Diagnosis not present

## 2018-03-01 LAB — CBC WITH DIFFERENTIAL/PLATELET
BASOS ABS: 0 10*3/uL (ref 0.0–0.1)
Basophils Relative: 1 %
EOS PCT: 6 %
Eosinophils Absolute: 0.3 10*3/uL (ref 0.0–0.7)
HCT: 36.5 % (ref 36.0–46.0)
Hemoglobin: 11.5 g/dL — ABNORMAL LOW (ref 12.0–15.0)
Lymphocytes Relative: 19 %
Lymphs Abs: 1 10*3/uL (ref 0.7–4.0)
MCH: 28.2 pg (ref 26.0–34.0)
MCHC: 31.5 g/dL (ref 30.0–36.0)
MCV: 89.5 fL (ref 78.0–100.0)
MONO ABS: 0.8 10*3/uL (ref 0.1–1.0)
Monocytes Relative: 14 %
Neutro Abs: 3.1 10*3/uL (ref 1.7–7.7)
Neutrophils Relative %: 60 %
PLATELETS: 173 10*3/uL (ref 150–400)
RBC: 4.08 MIL/uL (ref 3.87–5.11)
RDW: 14.7 % (ref 11.5–15.5)
WBC: 5.2 10*3/uL (ref 4.0–10.5)

## 2018-03-01 LAB — COMPREHENSIVE METABOLIC PANEL
ALT: 18 U/L (ref 14–54)
AST: 23 U/L (ref 15–41)
Albumin: 3.9 g/dL (ref 3.5–5.0)
Alkaline Phosphatase: 61 U/L (ref 38–126)
Anion gap: 11 (ref 5–15)
BUN: 37 mg/dL — AB (ref 6–20)
CO2: 21 mmol/L — AB (ref 22–32)
CREATININE: 1.42 mg/dL — AB (ref 0.44–1.00)
Calcium: 9.2 mg/dL (ref 8.9–10.3)
Chloride: 107 mmol/L (ref 101–111)
GFR calc non Af Amer: 31 mL/min — ABNORMAL LOW (ref >60)
GFR, EST AFRICAN AMERICAN: 36 mL/min — AB (ref >60)
Glucose, Bld: 96 mg/dL (ref 65–99)
POTASSIUM: 4.7 mmol/L (ref 3.5–5.1)
SODIUM: 139 mmol/L (ref 135–145)
Total Bilirubin: 0.7 mg/dL (ref 0.3–1.2)
Total Protein: 6.7 g/dL (ref 6.5–8.1)

## 2018-03-01 LAB — LIPASE, BLOOD: LIPASE: 45 U/L (ref 11–51)

## 2018-03-01 NOTE — ED Provider Notes (Signed)
[facility] EMERGENCY DEPARTMENT Provider Note   CSN: [id number] Arrival date & time: 03/01/18  1054     History   Chief Complaint Chief Complaint  Patient presents with  . Abdominal Pain    HPI Jane Andrews is a 84 y.o. female.  HPI Patient complains of episodic right upper abdominal pain starting yesterday after eating at [employer].  The pain does not radiate.  Only last for a brief period of time and then eases off.  Not associated with nausea or vomiting.  No fever or chills.  Patient had a bowel movement this morning which she states was normal.  No melanotic or grossly bloody stool.  Has history of cholelithiasis, nephrolithiasis and abdominal adhesions secondary to hysterectomy.  Denies urinary symptoms including dysuria, frequency, urgency, hematuria or flank pain. Past Medical History:  Diagnosis Date  . Abdominal adhesions 1994  . Allergic rhinitis   . Anemia   . Anxiety and depression   . Arthritis   . Atrial fibrillation ([place]) 10/2012   Associated with severe anemia and esophageal pill impaction  . Breast carcinoma ([place])    right mastectomy "25+ years ago"  . Cholelithiasis    asymptomatic  . Gastroesophageal reflux disease    Hiatal hernia  . Hypertension   . Hypothyroid   . Low back pain   . 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 Nephrolithiasis 2004   painless hematuria  . Upper GI bleed 2004   Multiple episodes of melena-? due to gastritis or adverse drug effect (nonsteroidals, small bowel ulceration with Fosamax); caused by Pepto-Bismol during one Emergency Department evaluation    Patient Active Problem List   Diagnosis Date Noted  . Obstipation 04/12/2016  . Vagal reaction 04/12/2016  . Abdominal pain 09/19/2014  . 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 Ecchymoses, spontaneous 05/31/2013  . Hypothyroid   . Atrial fibrillation ([place])   . Breast carcinoma ([place])   . Malabsorption   . CKD  (chronic kidney disease) stage 3, GFR 30-59 ml/min ([facility]) 10/21/2012  . Anemia, normocytic normochromic 07/06/2012  . Chronic diarrhea 02/10/2012  . Hypertension 02/10/2012    Past Surgical History:  Procedure Laterality Date  . ABDOMINAL HYSTERECTOMY     emergency s/p delivery  . ABDOMINAL HYSTERECTOMY  1960   massive gynecologic bleeding  . BOWEL RESECTION     Resulting short gut syndrome  . COLONOSCOPY W/ POLYPECTOMY  2005   Lipoma; diverticulosis  . COLONOSCOPY WITH ESOPHAGOGASTRODUODENOSCOPY (EGD)  11/22/2012   Rehman  . LAPAROSCOPIC LYSIS OF ADHESIONS  1965   s/p adhesions  . MASTECTOMY  right breast  . MASTECTOMY     Carcinoma of the breast; right  . UPPER GASTROINTESTINAL ENDOSCOPY      OB History    No data available       Home Medications    Prior to Admission medications   Medication Sig Start Date End Date Taking? Authorizing Provider  ALPRAZolam Duanne Moron) 1 MG tablet Take 1 mg by mouth at bedtime as needed for sleep.   Yes [provider]  Calcium Carbonate-Vitamin D (CALCIUM + D PO) Take 1 tablet by mouth daily.   Yes [provider]  Cholecalciferol (VITAMIN D PO) Take 1 tablet by mouth daily.   Yes [provider]  Cyanocobalamin (VITAMIN B-12 IJ) Inject as directed every 30 (thirty) days.   Yes [provider]  diclofenac (VOLTAREN) 75 MG EC tablet Take 1 tablet (  75 mg total) by mouth 2 (two) times daily with a meal. 02/16/18  Yes Carole Civil, MD  DULoxetine (CYMBALTA) 60 MG capsule Take 60 mg by mouth every morning.    Yes [provider]  folic acid (FOLVITE) 1 MG tablet Take 1 mg by mouth daily.   Yes [provider]  glucosamine-chondroitin 500-400 MG tablet Take 1 tablet by mouth every morning.    Yes [provider]  iron polysaccharides (NIFEREX) 150 MG capsule Take 1 capsule (150 mg total) by mouth every Monday, Wednesday, and Friday. Patient taking differently: Take 150 mg by mouth  daily.  06/27/13  Yes Rehman, Mechele Dawley, MD  Lactobacillus (ACIDOPHILUS) 100 MG CAPS Take 100 mg by mouth every morning.    Yes [provider]  levothyroxine (SYNTHROID, LEVOTHROID) 75 MCG tablet Take 75 mcg by mouth daily before breakfast.   Yes [provider]  Multiple Vitamin (MULTIVITAMIN) tablet Take 1 tablet by mouth every morning.    Yes [provider]  Omega-3 Fatty Acids (FISH OIL) 1000 MG CAPS Take 1,000 mg by mouth every morning.    Yes [provider]  omeprazole (PRILOSEC) 40 MG capsule TAKE ONE CAPSULE BY MOUTH DAILY 11/25/17  Yes Setzer, Rona Ravens, NP    Family History Family History  Problem Relation Age of Onset  . Anuerysm Father   . Rheum arthritis Sister   . Healthy Sister   . COPD Sister   . Healthy Brother   . Cancer Unknown   . Colon cancer Neg Hx     Social History Social History   Tobacco Use  . Smoking status: Former Smoker    Packs/day: 1.50    Years: 20.00    Pack years: 30.00    Types: Cigarettes  . Smokeless tobacco: Never Used  Substance Use Topics  . Alcohol use: No  . Drug use: No     Allergies   Codeine   Review of Systems Review of Systems  Constitutional: Negative for chills and fever.  Eyes: Negative for photophobia and visual disturbance.  Respiratory: Negative for cough and shortness of breath.   Cardiovascular: Negative for chest pain.  Gastrointestinal: Positive for abdominal pain. Negative for abdominal distention, blood in stool, constipation, diarrhea, nausea and vomiting.  Genitourinary: Negative for dysuria, flank pain, frequency and hematuria.  Musculoskeletal: Negative for back pain, myalgias and neck pain.  Neurological: Negative for dizziness, weakness, light-headedness, numbness and headaches.  All other systems reviewed and are negative.    Physical Exam Updated Vital Signs BP 100/71   Pulse 77   Temp 98.7 F (37.1 C) (Oral)   Resp 16   Ht 5\' 4"  (1.626 m)   Wt 51.7 kg  (114 lb)   SpO2 100%   BMI 19.57 kg/m   Physical Exam  Constitutional: She is oriented to person, place, and time. She appears well-developed and well-nourished. No distress.  HENT:  Head: Normocephalic and atraumatic.  Mouth/Throat: Oropharynx is clear and moist.  Eyes: EOM are normal. Pupils are equal, round, and reactive to light.  Neck: Normal range of motion. Neck supple.  Cardiovascular: Normal rate and regular rhythm. Exam reveals no gallop and no friction rub.  No murmur heard. Pulmonary/Chest: Effort normal and breath sounds normal. No stridor. No respiratory distress. She has no wheezes. She has no rales. She exhibits no tenderness.  Abdominal: Soft. Bowel sounds are normal. She exhibits no distension and no mass. There is no tenderness. There is no rebound  and no guarding. No hernia.  Musculoskeletal: Normal range of motion. She exhibits no edema or tenderness.  No CVA tenderness bilaterally.  Neurological: She is alert and oriented to person, place, and time.  Moves all extremities without focal deficit.  Sensation fully intact.  Skin: Skin is warm and dry. Capillary refill takes less than 2 seconds. No rash noted. She is not diaphoretic. No erythema.  Psychiatric: She has a normal mood and affect. Her behavior is normal.  Nursing note and vitals reviewed.    ED Treatments / Results  Labs (all labs ordered are listed, but only abnormal results are displayed) Labs Reviewed  CBC WITH DIFFERENTIAL/PLATELET - Abnormal; Notable for the following components:      Result Value   Hemoglobin 11.5 (*)    All other components within normal limits  COMPREHENSIVE METABOLIC PANEL - Abnormal; Notable for the following components:   CO2 21 (*)    BUN 37 (*)    Creatinine, Ser 1.42 (*)    GFR calc non Af Amer 31 (*)    GFR calc Af Amer 36 (*)    All other components within normal limits  LIPASE, BLOOD    EKG  EKG Interpretation None       Radiology US Abdomen  Complete  Result Date: 03/01/2018 CLINICAL DATA:  Upper abdominal pain.  History of breast carcinoma. EXAM: ABDOMEN ULTRASOUND COMPLETE COMPARISON:  CT abdomen and pelvis April 12, 2016 FINDINGS: Gallbladder: Within the gallbladder, there are echogenic foci which move and shadow consistent with cholelithiasis. Largest gallstone measures 1.5 cm in length. No gallbladder wall thickening or pericholecystic fluid. No sonographic Murphy sign noted by sonographer. Common bile duct: Diameter: 3 mm. No intrahepatic, common hepatic, or common bile duct dilatation. Liver: No focal lesion identified. Within normal limits in parenchymal echogenicity. Portal vein is patent on color Doppler imaging with normal direction of blood flow towards the liver. IVC: No abnormality visualized. Pancreas: No pancreatic mass or inflammatory focus. Spleen: Size and appearance within normal limits. Right Kidney: Length: 9.2 cm. Echogenicity within normal limits. No mass or hydronephrosis visualized. There is an apparent staghorn calculus on the right arising from the mid to lower pole regions of the right kidney. Left Kidney: Length: 9.3 cm. Echogenicity within normal limits. No hydronephrosis visualized. There are cystic lesions in the left kidney. The largest of these lesions arises in the upper pole measuring 3.3 x 2.9 x 3.4 cm. There is a cyst arising in the mid kidney measuring 1.2 x 1.0 x 1.0 cm. Subcentimeter cystic areas also noted. Abdominal aorta: No aneurysm visualized. Other findings: No demonstrable ascites. IMPRESSION: 1. Cholelithiasis. No gallbladder wall thickening or pericholecystic fluid. 2. Apparent staghorn calculus occupying portions of the mid to lower pole right kidney. No hydronephrosis on the right. 3.  Renal cysts on the left. 4.  Study otherwise unremarkable. Electronically Signed   By: Lowella Grip III M.D.   On: 03/01/2018 13:13    Procedures Procedures (including critical care time)  Medications  Ordered in ED Medications - No data to display   Initial Impression / Assessment and Plan / ED Course  I have reviewed the triage vital signs and the nursing notes.  Pertinent labs & imaging results that were available during my care of the patient were reviewed by me and considered in my medical decision making (see chart for details).    Mild increase in creatinine over baseline.  Normal white blood cell count.  Ultrasound with evidence of cholelithiasis  but not cholecystitis.  Patient has stable right staghorn calculus in the kidney.  No evidence of hydronephrosis.  Patient is currently pain-free.  Abdominal exam is benign. She will follow-up with Dr. Laural Golden. She is not interested in surgery at this time.  Strict return precautions have been given as well as dietary limitations.  Final Clinical Impressions(s) / ED Diagnoses   Final diagnoses:  Calculus of gallbladder without cholecystitis without obstruction    ED Discharge Orders    None       Julianne Rice, MD 03/01/18 1500

## 2018-03-01 NOTE — ED Notes (Signed)
ED Provider at bedside. 

## 2018-03-01 NOTE — ED Triage Notes (Signed)
Pt reports pain in right side of abdomen since last night.  Denies n/v/d.  States she was concerned due to past intestinal problems and surgeries.  Could not get Dr. Laural Golden this morning.

## 2018-03-01 NOTE — Telephone Encounter (Signed)
Patient had called in this morning for an appointment for right side pain.  (Mitzie had gotten off voicemail this morning)  I returned call at 12:15ish and left message for return call to give her an appt.   She called back on my phone this afternoon and I did not see until I came finally back into my office at 5:00.  She stated she went to ER and Dr. Laural Golden could call her back if he wanted to.  Icalled patient back. She said they saw on CT gallstones and kidney stones .  Mentioned that she Landry Dyke have a blockage.  No nausea just pain that comes and goes.  Last week she had a pain in the pit of her stomach and took some Pepto Bismol and now stools are black but knows it could be from that but would think it would have passed by now  They said she needed to see Dr. Laural Golden and Dr. Constance Haw.  She is concerned about the surgery with all the issues she has.  Please advise

## 2018-03-02 NOTE — Telephone Encounter (Signed)
Forwarded to Dr.Rehman. 

## 2018-03-02 NOTE — Telephone Encounter (Signed)
Call returned. Patient is now pain-free.  She is more concerned about black stool but she took Pepto-Bismol last week. Hemoglobin yesterday was 11.5.  H&H and Hemoccult x1.

## 2018-03-02 NOTE — Telephone Encounter (Signed)
To be addresses with Dr.Rehman.

## 2018-03-03 ENCOUNTER — Other Ambulatory Visit (INDEPENDENT_AMBULATORY_CARE_PROVIDER_SITE_OTHER): Payer: Self-pay | Admitting: *Deleted

## 2018-03-03 DIAGNOSIS — R195 Other fecal abnormalities: Secondary | ICD-10-CM

## 2018-03-03 DIAGNOSIS — D649 Anemia, unspecified: Secondary | ICD-10-CM

## 2018-03-03 NOTE — Telephone Encounter (Signed)
H&H noted , and a hemoccult card is ready for the patient to pick up.

## 2018-03-05 ENCOUNTER — Telehealth (INDEPENDENT_AMBULATORY_CARE_PROVIDER_SITE_OTHER): Payer: Self-pay | Admitting: *Deleted

## 2018-03-05 DIAGNOSIS — J189 Pneumonia, unspecified organism: Secondary | ICD-10-CM | POA: Diagnosis not present

## 2018-03-05 NOTE — Telephone Encounter (Signed)
Stool is guaiac negative. Results given to patient. She has not had H&H yet.

## 2018-03-05 NOTE — Telephone Encounter (Signed)
   Diagnosis:    Result(s)   Card 1:Negative:        :    Completed by: Thomas Hoff ,LPN   HEMOCCULT SENSA DEVELOPER: LOT#: [id number]  EXPIRATION DATE: 2021-11   HEMOCCULT SENSA CARD:  LOT#: [id number]  [id number]  EXPIRATION DATE: 05/21   CARD CONTROL RESULTS:  POSITIVE:Positive  NEGATIVE: Negative    ADDITIONAL COMMENTS: Results forwarded to Dr.Rehman. Patient called and made aware of results and that she needed to have lab work completed.

## 2018-03-08 DIAGNOSIS — R195 Other fecal abnormalities: Secondary | ICD-10-CM | POA: Diagnosis not present

## 2018-03-08 DIAGNOSIS — D649 Anemia, unspecified: Secondary | ICD-10-CM | POA: Diagnosis not present

## 2018-03-08 LAB — HEMOGLOBIN AND HEMATOCRIT, BLOOD
HCT: 31.7 % — ABNORMAL LOW (ref 35.0–45.0)
Hemoglobin: 10.7 g/dL — ABNORMAL LOW (ref 11.7–15.5)

## 2018-03-09 ENCOUNTER — Encounter: Payer: Self-pay | Admitting: General Surgery

## 2018-03-09 ENCOUNTER — Ambulatory Visit (INDEPENDENT_AMBULATORY_CARE_PROVIDER_SITE_OTHER): Payer: Medicare Other | Admitting: General Surgery

## 2018-03-09 VITALS — BP 145/81 | HR 86 | Temp 96.4°F | Resp 18 | Ht 61.0 in | Wt 119.0 lb

## 2018-03-09 DIAGNOSIS — K8020 Calculus of gallbladder without cholecystitis without obstruction: Secondary | ICD-10-CM | POA: Diagnosis not present

## 2018-03-09 NOTE — Patient Instructions (Signed)
Cholelithiasis Cholelithiasis is also called "gallstones." It is a kind of gallbladder disease. The gallbladder is an organ that stores a liquid (bile) that helps you digest fat. Gallstones may not cause symptoms (may be silent gallstones) until they cause a blockage, and then they can cause pain (gallbladder attack). Follow these instructions at home:  Take over-the-counter and prescription medicines only as told by your doctor.  Stay at a healthy weight.  Eat healthy foods. This includes: ? Eating fewer fatty foods, like fried foods. ? Eating fewer refined carbs (refined carbohydrates). Refined carbs are breads and grains that are highly processed, like white bread and white rice. Instead, choose whole grains like whole-wheat bread and brown rice. ? Eating more fiber. Almonds, fresh fruit, and beans are healthy sources of fiber.  Keep all follow-up visits as told by your doctor. This is important. Contact a doctor if:  You have sudden pain in the upper right side of your belly (abdomen). Pain might spread to your right shoulder or your chest. This may be a sign of a gallbladder attack.  You feel sick to your stomach (are nauseous).  You throw up (vomit).  You have been diagnosed with gallstones that have no symptoms and you get: ? Belly pain. ? Discomfort, burning, or fullness in the upper part of your belly (indigestion). Get help right away if:  You have sudden pain in the upper right side of your belly, and it lasts for more than 2 hours.  You have belly pain that lasts for more than 5 hours.  You have a fever or chills.  You keep feeling sick to your stomach or you keep throwing up.  Your skin or the whites of your eyes turn yellow (jaundice).  You have dark-colored pee (urine).  You have light-colored poop (stool). Summary  Cholelithiasis is also called "gallstones."  The gallbladder is an organ that stores a liquid (bile) that helps you digest fat.  Silent  gallstones are gallstones that do not cause symptoms.  A gallbladder attack may cause sudden pain in the upper right side of your belly. Pain might spread to your right shoulder or your chest. If this happens, contact your doctor.  If you have sudden pain in the upper right side of your belly that lasts for more than 2 hours, get help right away. This information is not intended to replace advice given to you by your health care provider. Make sure you discuss any questions you have with your health care provider. Document Released: 05/19/2008 Document Revised: 08/17/2016 Document Reviewed: 08/17/2016 Elsevier Interactive Patient Education  2017 [place] Diet for Pancreatitis or Gallbladder Conditions A low-fat diet can be helpful if you have pancreatitis or a gallbladder condition. With these conditions, your pancreas and gallbladder have trouble digesting fats. A healthy eating plan with less fat will help rest your pancreas and gallbladder and reduce your symptoms. What do I need to know about this diet?  Eat a low-fat diet. ? Reduce your fat intake to less than 20-30% of your total daily calories. This is less than 50-60 g of fat per day. ? Remember that you need some fat in your diet. Ask your dietician what your daily goal should be. ? Choose nonfat and low-fat healthy foods. Look for the words "nonfat," "low fat," or "fat free." ? As a guide, look on the label and choose foods with less than 3 g of fat per serving. Eat only one serving.  Avoid alcohol.  Do not smoke. If you need help quitting, talk with your health care provider.  Eat small frequent meals instead of three large heavy meals. What foods can I eat? Grains Include healthy grains and starches such as potatoes, wheat bread, fiber-rich cereal, and brown rice. Choose whole grain options whenever possible. In adults, whole grains should account for 45-65% of your daily calories. Fruits and Vegetables Eat  plenty of fruits and vegetables. Fresh fruits and vegetables add fiber to your diet. Meats and Other Protein Sources Eat lean meat such as chicken and pork. Trim any fat off of meat before cooking it. Eggs, fish, and beans are other sources of protein. In adults, these foods should account for 10-35% of your daily calories. Dairy Choose low-fat milk and dairy options. Dairy includes fat and protein, as well as calcium. Fats and Oils Limit high-fat foods such as fried foods, sweets, baked goods, sugary drinks. Other Creamy sauces and condiments, such as mayonnaise, can add extra fat. Think about whether or not you need to use them, or use smaller amounts or low fat options. What foods are not recommended?  High fat foods, such as: ? [employer]. ? Ice cream. ? [place] toast. ? Sweet rolls. ? Pizza. ? Cheese bread. ? Foods covered with batter, butter, creamy sauces, or cheese. ? Fried foods. ? Sugary drinks and desserts.  Foods that cause gas or bloating This information is not intended to replace advice given to you by your health care provider. Make sure you discuss any questions you have with your health care provider. Document Released: 12/06/2013 Document Revised: 05/08/2016 Document Reviewed: 11/14/2013 Elsevier Interactive Patient Education  2017 Elsevier Inc.  Laparoscopic Cholecystectomy Laparoscopic cholecystectomy is surgery to remove the gallbladder. The gallbladder is a pear-shaped organ that lies beneath the liver on the right side of the body. The gallbladder stores bile, which is a fluid that helps the body to digest fats. Cholecystectomy is often done for inflammation of the gallbladder (cholecystitis). This condition is usually caused by a buildup of gallstones (cholelithiasis) in the gallbladder. Gallstones can block the flow of bile, which can result in inflammation and pain. In severe cases, emergency surgery may be required. This procedure is done though small  incisions in your abdomen (laparoscopic surgery). A thin scope with a camera (laparoscope) is inserted through one incision. Thin surgical instruments are inserted through the other incisions. In some cases, a laparoscopic procedure may be turned into a type of surgery that is done through a larger incision (open surgery). Tell a health care provider about:  Any allergies you have.  All medicines you are taking, including vitamins, herbs, eye drops, creams, and over-the-counter medicines.  Any problems you or family members have had with anesthetic medicines.  Any blood disorders you have.  Any surgeries you have had.  Any medical conditions you have.  Whether you are pregnant or may be pregnant. What are the risks? Generally, this is a safe procedure. However, problems may occur, including:  Infection.  Bleeding.  Allergic reactions to medicines.  Damage to other structures or organs.  A stone remaining in the common bile duct. The common bile duct carries bile from the gallbladder into the small intestine.  A bile leak from the cyst duct that is clipped when your gallbladder is removed.  What happens before the procedure? Staying hydrated Follow instructions from your health care provider about hydration, which may include:  Up to 2 hours before the procedure - you may continue to  drink clear liquids, such as water, clear fruit juice, black coffee, and plain tea.  Eating and drinking restrictions Follow instructions from your health care provider about eating and drinking, which may include:  8 hours before the procedure - stop eating heavy meals or foods such as meat, fried foods, or fatty foods.  6 hours before the procedure - stop eating light meals or foods, such as toast or cereal.  6 hours before the procedure - stop drinking milk or drinks that contain milk.  2 hours before the procedure - stop drinking clear liquids.  Medicines  Ask your health care  provider about: ? Changing or stopping your regular medicines. This is especially important if you are taking diabetes medicines or blood thinners. ? Taking medicines such as aspirin and ibuprofen. These medicines can thin your blood. Do not take these medicines before your procedure if your health care provider instructs you not to.  You may be given antibiotic medicine to help prevent infection. General instructions  Let your health care provider know if you develop a cold or an infection before surgery.  Plan to have someone take you home from the hospital or clinic.  Ask your health care provider how your surgical site will be marked or identified. What happens during the procedure?  To reduce your risk of infection: ? Your health care team will wash or sanitize their hands. ? Your skin will be washed with soap. ? Hair may be removed from the surgical area.  An IV tube may be inserted into one of your veins.  You will be given one or more of the following: ? A medicine to help you relax (sedative). ? A medicine to make you fall asleep (general anesthetic).  A breathing tube will be placed in your mouth.  Your surgeon will make several small cuts (incisions) in your abdomen.  The laparoscope will be inserted through one of the small incisions. The camera on the laparoscope will send images to a TV screen (monitor) in the operating room. This lets your surgeon see inside your abdomen.  Air-like gas will be pumped into your abdomen. This will expand your abdomen to give the surgeon more room to perform the surgery.  Other tools that are needed for the procedure will be inserted through the other incisions. The gallbladder will be removed through one of the incisions.  Your common bile duct may be examined. If stones are found in the common bile duct, they may be removed.  After your gallbladder has been removed, the incisions will be closed with stitches (sutures), staples, or  skin glue.  Your incisions may be covered with a bandage (dressing). The procedure may vary among health care providers and hospitals. What happens after the procedure?  Your blood pressure, heart rate, breathing rate, and blood oxygen level will be monitored until the medicines you were given have worn off.  You will be given medicines as needed to control your pain.  Do not drive for 24 hours if you were given a sedative. This information is not intended to replace advice given to you by your health care provider. Make sure you discuss any questions you have with your health care provider. Document Released: 12/01/2005 Document Revised: 06/22/2016 Document Reviewed: 05/19/2016 Elsevier Interactive Patient Education  2018 [place] is a procedure to drain fluid from the gallbladder by using a flexible tube (catheter). The gallbladder is a pear-shaped organ that lies beneath the liver on the  right side of the body. The gallbladder stores bile, which is a fluid that helps the body to digest fats. You may have this procedure:  If your gallbladder is swollen or irritated due to bile build up.  To prepare you for gallbladder surgery.  To control your symptoms if you cannot have gallbladder surgery.  Tell a health care provider about:  Any allergies you have.  All medicines you are taking, including vitamins, herbs, eye drops, creams, and over-the-counter medicines.  Any problems you or family members have had with anesthetic medicines.  Any blood disorders you have.  Any surgeries you have had.  Any medical conditions you have.  Whether you are pregnant or may be pregnant. What are the risks? Generally, this is a safe procedure. However, problems may occur, including:  The catheter moving out of place.  Clogging of the catheter.  Infection of the incision site.  Internal bleeding.  Puncture of the gallbladder. This can cause the  bile to leak.  Infection inside the abdomen (peritonitis).  Damage to other structures or organs.  Low blood pressure and slowed heart rate.  Allergic reactions to medicines or dyes.  What happens before the procedure?  You may need to have tests, including: ? Imaging studies of your gallbladder. ? Blood tests.  Follow instructions from your health care provider about eating or drinking restrictions.  Do not use tobacco products, including cigarettes, chewing tobacco, or e-cigarettes, as told by your health care provider. If you need help quitting, ask your health care provider.  Ask your health care provider about: ? Changing or stopping your regular medicines. This is especially important if you are taking diabetes medicines or blood thinners. ? Taking medicines such as aspirin and ibuprofen. These medicines can thin your blood. Do not take these medicines before your procedure if your health care provider instructs you not to.  Plan to have someone take you home after the procedure.  If you go home right after the procedure, plan to have someone with you for 24 hours.  Ask your health care provider how your surgical site will be marked or identified.  You may be given antibiotic medicine to help prevent infection. What happens during the procedure?  To reduce your risk of infection: ? Your health care team will wash or sanitize their hands. ? Your skin will be washed with soap.  An IV tube will be inserted into one of your veins.  You will be given one or more of the following: ? A medicine to help you relax (sedative). ? A medicine to numb the area (local anesthetic).  A small incision will be made in your abdomen.  A long needle or a wide puncturing tool (trocar) will be put through the incision.  Your health care provider will use an imaging study (ultrasound) to guide the needle or trocar into your gallbladder.  After the needle or trocar is in your  gallbladder, a small amount of dye may be injected. An X-ray may be taken to make sure that the needle or trocar is in the correct place.  A catheter will be placed through the needle or trocar.  The catheter will be secured to your skin with stitches (sutures).  The catheter will be connected to a drainage bag. Fluid will drain from the gallbladder into the bag. Some of this bile may be sent to the lab to be examined.  A bandage (dressing) will be placed over the incision. The procedure may vary  among health care providers and hospitals. What happens after the procedure?  Your blood pressure, heart rate, breathing rate, and blood oxygen level will be monitored often until the medicines you were given have worn off.  Dye may be injected through your catheter to check the catheter and your gallbladder.  Your gallbladder may be flushed out (irrigated) through the catheter.  Your catheter and drainage bag may need to stay in place for several weeks or as told by your health care provider. This information is not intended to replace advice given to you by your health care provider. Make sure you discuss any questions you have with your health care provider. Document Released: 02/27/2009 Document Revised: 05/08/2016 Document Reviewed: 03/14/2015 Elsevier Interactive Patient Education  2018 [employer].

## 2018-03-09 NOTE — Progress Notes (Signed)
[facility] History and Physical  Reason for Referral: Cholelthiasis  Referring Physician: Dr. Laural Golden ED   Chief Complaint    Abdominal Pain      Jane Andrews is a 84 y.o. female.  HPI: Jane Andrews is a 84 yo who has had multiple abdominal surgeries in the past including an open emergency hysterectomy after postpartum bleeding and a subsequent surgery for adhesions with removal of a large quantity of small bowel per her report (she only has 4 feet remaining). She was seen in the ED with a 1 time episode of right upper quadrant pain. She had eaten at [facility]. She had a prior history of gallstones, but had never had any issues. She said the pain was intermittent and sort of stinging in nature.  She otherwise has had no additional pain. No nausea. No vomiting. She follows with Dr. Laural Golden for her reflux and this is a long standing relationship.   Past Medical History:  Diagnosis Date  . Abdominal adhesions 1994  . Allergic rhinitis   . Anemia   . Anxiety and depression   . Arthritis   . Atrial fibrillation ([place]) 10/2012   Associated with severe anemia and esophageal pill impaction  . Breast carcinoma ([place])    right mastectomy "25+ years ago"  . Cholelithiasis    asymptomatic  . Gastroesophageal reflux disease    Hiatal hernia  . Hypertension   . Hypothyroid   . Low back pain   . 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 Nephrolithiasis 2004   painless hematuria  . Upper GI bleed 2004   Multiple episodes of melena-? due to gastritis or adverse drug effect (nonsteroidals, small bowel ulceration with Fosamax); caused by Pepto-Bismol during one Emergency Department evaluation    Past Surgical History:  Procedure Laterality Date  . ABDOMINAL HYSTERECTOMY     emergency s/p delivery  . ABDOMINAL HYSTERECTOMY  1960   massive gynecologic bleeding  . BOWEL RESECTION     Resulting short gut syndrome  . COLONOSCOPY  W/ POLYPECTOMY  2005   Lipoma; diverticulosis  . COLONOSCOPY WITH ESOPHAGOGASTRODUODENOSCOPY (EGD)  11/22/2012   [facility]  . LAPAROSCOPIC LYSIS OF ADHESIONS  1965   s/p adhesions  . MASTECTOMY  right breast  . MASTECTOMY     Carcinoma of the breast; right  . UPPER GASTROINTESTINAL ENDOSCOPY      Family History  Problem Relation Age of Onset  . Anuerysm Father   . Rheum arthritis Sister   . Healthy Sister   . COPD Sister   . Healthy Brother   . Cancer Unknown   . Colon cancer Neg Hx     Social History   Tobacco Use  . Smoking status: Former Smoker    Packs/day: 1.50    Years: 20.00    Pack years: 30.00    Types: Cigarettes  . Smokeless tobacco: Never Used  Substance Use Topics  . Alcohol use: No  . Drug use: No    Medications: I have reviewed the patient's current medications. Prior to Admission:  (Not in a hospital admission) Allergies as of 03/09/2018      Reactions   Codeine Nausea And Vomiting      Medication List        Accurate as of 03/09/18 11:13 AM. Always use your most recent med list.          Acidophilus 100 MG Caps Take 100 mg by mouth every  morning.   ALPRAZolam 1 MG tablet Commonly known as:  XANAX Take 1 mg by mouth at bedtime as needed for sleep.   CALCIUM + D PO Take 1 tablet by mouth daily.   diclofenac 75 MG EC tablet Commonly known as:  VOLTAREN Take 1 tablet (75 mg total) by mouth 2 (two) times daily with a meal.   DULoxetine 60 MG capsule Commonly known as:  CYMBALTA Take 60 mg by mouth every morning.   Fish Oil 1000 MG Caps Take 1,000 mg by mouth every morning.   folic acid 1 MG tablet Commonly known as:  FOLVITE Take 1 mg by mouth daily.   glucosamine-chondroitin 500-400 MG tablet Take 1 tablet by mouth every morning.   iron polysaccharides 150 MG capsule Commonly known as:  NIFEREX Take 1 capsule (150 mg total) by mouth every Monday, Wednesday, and Friday.   levothyroxine 75 MCG tablet Commonly known as:   SYNTHROID, LEVOTHROID Take 75 mcg by mouth daily before breakfast.   multivitamin tablet Take 1 tablet by mouth every morning.   omeprazole 40 MG capsule Commonly known as:  PRILOSEC TAKE ONE CAPSULE BY MOUTH DAILY   VITAMIN B-12 IJ Inject as directed every 30 (thirty) days.   VITAMIN D PO Take 1 tablet by mouth daily.       ROS:  A comprehensive review of systems was negative except for: Musculoskeletal: positive for back pain Allergic/Immunologic: positive for drug allergies  Blood pressure (!) 145/81, pulse 86, temperature (!) 96.4 F (35.8 C), resp. rate 18, height 5\' 1"  (1.549 m), weight 119 lb (54 kg). Physical Exam  Constitutional: She is oriented to person, place, and time and well-developed, well-nourished, and in no distress.  HENT:  Head: Normocephalic.  Eyes: Pupils are equal, round, and reactive to light.  Neck: Normal range of motion.  Cardiovascular: Normal rate.  Pulmonary/Chest: Effort normal and breath sounds normal.  Abdominal: Soft. She exhibits no distension. There is no tenderness. There is no rebound and no guarding.  Large midline scar  Musculoskeletal: Normal range of motion. She exhibits no edema.  Neurological: She is alert and oriented to person, place, and time.  Skin: Skin is warm.  Psychiatric: Mood, memory, affect and judgment normal.  Vitals reviewed.   Results: Results for orders placed or performed in visit on 03/03/18 (from the past 48 hour(s))  Hemoglobin and hematocrit, blood     Status: Abnormal   Collection Time: 03/08/18  1:44 PM  Result Value Ref Range   Hemoglobin 10.7 (L) 11.7 - 15.5 g/dL   HCT 31.7 (L) 35.0 - 45.0 %    [name] Abd IMPRESSION: 1. Cholelithiasis. No gallbladder wall thickening or pericholecystic fluid. 2. Apparent staghorn calculus occupying portions of the mid to lower pole right kidney. No hydronephrosis on the right. 3.  Renal cysts on the left. 4.  Study otherwise unremarkable.  Results for  Jane Andrews, Jane Andrews (MRN [id number]) as of 03/09/2018 11:13  Ref. Range 03/01/2018 13:48  Sodium Latest Ref Range: 135 - 145 mmol/L 139  Potassium Latest Ref Range: 3.5 - 5.1 mmol/L 4.7  Chloride Latest Ref Range: 101 - 111 mmol/L 107  CO2 Latest Ref Range: 22 - 32 mmol/L 21 (L)  Glucose Latest Ref Range: 65 - 99 mg/dL 96  BUN Latest Ref Range: 6 - 20 mg/dL 37 (H)  Creatinine Latest Ref Range: 0.44 - 1.00 mg/dL 1.42 (H)  Calcium Latest Ref Range: 8.9 - 10.3 mg/dL 9.2  Anion gap Latest Ref Range:  5 - 15  11  Alkaline Phosphatase Latest Ref Range: 38 - 126 U/L 61  Albumin Latest Ref Range: 3.5 - 5.0 g/dL 3.9  Lipase Latest Ref Range: 11 - 51 U/L 45  AST Latest Ref Range: 15 - 41 U/L 23  ALT Latest Ref Range: 14 - 54 U/L 18  Total Protein Latest Ref Range: 6.5 - 8.1 g/dL 6.7  Total Bilirubin Latest Ref Range: 0.3 - 1.2 mg/dL 0.7  GFR, Est Non African American Latest Ref Range: >60 mL/min 31 (L)  GFR, Est African American Latest Ref Range: >60 mL/min 36 (L)    Assessment & Plan:  Jane Andrews is a 84 y.o. female with cholelithiasis that has had one episode of biliary colic. She has a history of large open surgeries and is very worried about any additional procedures. We discussed gallbladder disease and stones, and that she has had these stones for years (earliest imaging from 2006 with gallstone).  Given the risk of an open surgery and the possibility she had no additional issues, at this time the risk of surgery outweigh the benefits in a 89 year that has other medical conditions, large midline scar, and risk of healing issues given her age.  -Follow up PRN -Information about stones, diet, and surgery given to the patient.   All questions were answered to the satisfaction of the patient.   Jane Andrews 03/09/2018, 11:13 AM

## 2018-03-10 ENCOUNTER — Other Ambulatory Visit (INDEPENDENT_AMBULATORY_CARE_PROVIDER_SITE_OTHER): Payer: Self-pay | Admitting: *Deleted

## 2018-03-10 ENCOUNTER — Encounter (INDEPENDENT_AMBULATORY_CARE_PROVIDER_SITE_OTHER): Payer: Self-pay | Admitting: *Deleted

## 2018-03-10 DIAGNOSIS — D649 Anemia, unspecified: Secondary | ICD-10-CM

## 2018-03-18 DIAGNOSIS — D1801 Hemangioma of skin and subcutaneous tissue: Secondary | ICD-10-CM | POA: Diagnosis not present

## 2018-03-18 DIAGNOSIS — I781 Nevus, non-neoplastic: Secondary | ICD-10-CM | POA: Diagnosis not present

## 2018-04-13 ENCOUNTER — Ambulatory Visit (INDEPENDENT_AMBULATORY_CARE_PROVIDER_SITE_OTHER): Payer: Medicare Other | Admitting: Internal Medicine

## 2018-04-13 ENCOUNTER — Encounter (INDEPENDENT_AMBULATORY_CARE_PROVIDER_SITE_OTHER): Payer: Self-pay | Admitting: Internal Medicine

## 2018-04-13 VITALS — BP 122/70 | HR 62 | Temp 97.7°F | Resp 18 | Ht 63.0 in | Wt 118.2 lb

## 2018-04-13 DIAGNOSIS — D649 Anemia, unspecified: Secondary | ICD-10-CM

## 2018-04-13 DIAGNOSIS — R634 Abnormal weight loss: Secondary | ICD-10-CM

## 2018-04-13 DIAGNOSIS — R1013 Epigastric pain: Secondary | ICD-10-CM | POA: Diagnosis not present

## 2018-04-13 LAB — CBC
HEMATOCRIT: 30.8 % — AB (ref 35.0–45.0)
HEMOGLOBIN: 10.2 g/dL — AB (ref 11.7–15.5)
MCH: 29.1 pg (ref 27.0–33.0)
MCHC: 33.1 g/dL (ref 32.0–36.0)
MCV: 88 fL (ref 80.0–100.0)
MPV: 10.9 fL (ref 7.5–12.5)
Platelets: 182 10*3/uL (ref 140–400)
RBC: 3.5 10*6/uL — AB (ref 3.80–5.10)
RDW: 13.4 % (ref 11.0–15.0)
WBC: 6 10*3/uL (ref 3.8–10.8)

## 2018-04-13 LAB — TSH: TSH: 1.79 m[IU]/L (ref 0.40–4.50)

## 2018-04-13 MED ORDER — SIMETHICONE 180 MG PO CAPS: 180.0000 mg | ORAL_CAPSULE | Freq: Three times a day (TID) | ORAL | 0 refills | Status: DC | PRN

## 2018-04-13 NOTE — Patient Instructions (Signed)
Please note that diclofenac does reduced to once a day.  Always take it with food. Not take iron pills anymore. Notify if you have rectal bleeding tarry stool or epigastric pain. Physician will call with results of blood test. Weight check in 2 months.
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
## 2018-04-16 ENCOUNTER — Other Ambulatory Visit (INDEPENDENT_AMBULATORY_CARE_PROVIDER_SITE_OTHER): Payer: Self-pay | Admitting: *Deleted

## 2018-04-16 DIAGNOSIS — R634 Abnormal weight loss: Secondary | ICD-10-CM

## 2018-04-16 DIAGNOSIS — D649 Anemia, unspecified: Secondary | ICD-10-CM

## 2018-05-03 ENCOUNTER — Other Ambulatory Visit (INDEPENDENT_AMBULATORY_CARE_PROVIDER_SITE_OTHER): Payer: Self-pay | Admitting: *Deleted

## 2018-05-03 ENCOUNTER — Encounter (INDEPENDENT_AMBULATORY_CARE_PROVIDER_SITE_OTHER): Payer: Self-pay | Admitting: *Deleted

## 2018-05-03 DIAGNOSIS — D649 Anemia, unspecified: Secondary | ICD-10-CM

## 2018-05-03 DIAGNOSIS — R634 Abnormal weight loss: Secondary | ICD-10-CM

## 2018-05-18 ENCOUNTER — Encounter (INDEPENDENT_AMBULATORY_CARE_PROVIDER_SITE_OTHER): Payer: Self-pay | Admitting: Internal Medicine

## 2018-05-18 ENCOUNTER — Ambulatory Visit (INDEPENDENT_AMBULATORY_CARE_PROVIDER_SITE_OTHER): Payer: Medicare Other | Admitting: Internal Medicine

## 2018-05-18 VITALS — BP 140/80 | HR 60 | Temp 97.7°F | Ht 63.0 in | Wt 116.9 lb

## 2018-05-18 DIAGNOSIS — K8010 Calculus of gallbladder with chronic cholecystitis without obstruction: Secondary | ICD-10-CM | POA: Diagnosis not present

## 2018-05-18 DIAGNOSIS — R1011 Right upper quadrant pain: Secondary | ICD-10-CM

## 2018-05-18 LAB — HEPATIC FUNCTION PANEL
AG RATIO: 1.8 (calc) (ref 1.0–2.5)
ALBUMIN MSPROF: 3.9 g/dL (ref 3.6–5.1)
ALT: 13 U/L (ref 6–29)
AST: 16 U/L (ref 10–35)
Alkaline phosphatase (APISO): 59 U/L (ref 33–130)
BILIRUBIN TOTAL: 0.4 mg/dL (ref 0.2–1.2)
Bilirubin, Direct: 0.1 mg/dL (ref 0.0–0.2)
GLOBULIN: 2.2 g/dL (ref 1.9–3.7)
Indirect Bilirubin: 0.3 mg/dL (calc) (ref 0.2–1.2)
Total Protein: 6.1 g/dL (ref 6.1–8.1)

## 2018-05-18 NOTE — Progress Notes (Signed)
Subjective:    Patient ID: Jane Andrews, female    DOB: 03/24/1925, 84 y.o.   MRN: [id number] 04/13/2018 Wt 118. HPI Presents today with c/o epigastric pain. She was last seen by Dr. Laural Golden in April of this year.No nausea or vomiting. No fever. She states she had pain RUQ Friday but has resolved. The pain resolved after 1 1/2 hrs. The pain was not severe. The pain occurred after she ate  She cannot remember what she ate. She has very little acid reflux.  No tenderness in his RUQ today.  Her appetite is good.  She has a BM x 1 a day.   Has been evaluated by Dr. Precious Bard for cholelithiasis. She did not recommend surgery due to age and other health issues.  Hx of atrial fib in the past.   CBC    Component Value Date/Time   WBC 6.0 04/13/2018 1001   RBC 3.50 (L) 04/13/2018 1001   HGB 10.2 (L) 04/13/2018 1001   HCT 30.8 (L) 04/13/2018 1001   PLT 182 04/13/2018 1001   MCV 88.0 04/13/2018 1001   MCH 29.1 04/13/2018 1001   MCHC 33.1 04/13/2018 1001   RDW 13.4 04/13/2018 1001   LYMPHSABS 1.0 03/01/2018 1348   MONOABS 0.8 03/01/2018 1348   EOSABS 0.3 03/01/2018 1348   BASOSABS 0.0 03/01/2018 1348     Review of Systems Past Medical History:  Diagnosis Date  . Abdominal adhesions 1994  . Allergic rhinitis   . Anemia   . Anxiety and depression   . Arthritis   . Atrial fibrillation ([place]) 10/2012   Associated with severe anemia and esophageal pill impaction  . Breast carcinoma ([place])    right mastectomy "25+ years ago"  . Cholelithiasis    asymptomatic  . Gastroesophageal reflux disease    Hiatal hernia  . Hypertension   . Hypothyroid   . Low back pain   . 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 Nephrolithiasis 2004   painless hematuria  . Upper GI bleed 2004   Multiple episodes of melena-? due to gastritis or adverse drug effect (nonsteroidals, small bowel ulceration with Fosamax); caused by Pepto-Bismol during one  Emergency Department evaluation    Past Surgical History:  Procedure Laterality Date  . ABDOMINAL HYSTERECTOMY     emergency s/p delivery  . ABDOMINAL HYSTERECTOMY  1960   massive gynecologic bleeding  . BOWEL RESECTION     Resulting short gut syndrome  . COLONOSCOPY W/ POLYPECTOMY  2005   Lipoma; diverticulosis  . COLONOSCOPY WITH ESOPHAGOGASTRODUODENOSCOPY (EGD)  11/22/2012   [facility]  . LAPAROSCOPIC LYSIS OF ADHESIONS  1965   s/p adhesions  . MASTECTOMY  right breast  . MASTECTOMY     Carcinoma of the breast; right  . UPPER GASTROINTESTINAL ENDOSCOPY      Allergies  Allergen Reactions  . Codeine Nausea And Vomiting    Current Outpatient Medications on File Prior to Visit  Medication Sig Dispense Refill  . ALPRAZolam (XANAX) 1 MG tablet Take 1 mg by mouth at bedtime as needed for sleep.    . Cyanocobalamin (VITAMIN B-12 IJ) Inject as directed every 30 (thirty) days.    . diclofenac (VOLTAREN) 75 MG EC tablet Take 1 tablet (75 mg total) by mouth daily with breakfast. 30 tablet 2  . DULoxetine (CYMBALTA) 60 MG capsule Take 60 mg by mouth every morning.     Marland Kitchen levothyroxine (SYNTHROID, LEVOTHROID) 75 MCG tablet  Take 75 mcg by mouth daily before breakfast.    . Multiple Vitamin (MULTIVITAMIN) tablet Take 1 tablet by mouth every morning.     . Omega-3 Fatty Acids (FISH OIL) 1000 MG CAPS Take 1,000 mg by mouth every morning.     Marland Kitchen omeprazole (PRILOSEC) 40 MG capsule TAKE ONE CAPSULE BY MOUTH DAILY 90 capsule 3  . OVER THE COUNTER MEDICATION Honey Capsule - patient takes 1 every day.    . Simethicone 180 MG CAPS Take 1 capsule (180 mg total) by mouth 3 (three) times daily as needed.  0   No current facility-administered medications on file prior to visit.         Objective:   Physical Exam Blood pressure 140/80, pulse 60, temperature 97.7 F (36.5 C), height 5\' 3"  (1.6 m), weight 116 lb 14.4 oz (53 kg). Alert and oriented. Skin warm and dry. Oral mucosa is moist.   . Sclera  anicteric, conjunctivae is pink. Thyroid not enlarged. No cervical lymphadenopathy. Lungs clear. Heart regular rate and rhythm.  Abdomen is soft. Bowel sounds are positive. No hepatomegaly. No abdominal masses felt. No tenderness.  No edema to lower extremities.          Assessment & Plan:  RUQ pain. Cholelithiasis. Referral to Armandina Gemma MD

## 2018-05-20 DIAGNOSIS — I781 Nevus, non-neoplastic: Secondary | ICD-10-CM | POA: Diagnosis not present

## 2018-06-07 DIAGNOSIS — E079 Disorder of thyroid, unspecified: Secondary | ICD-10-CM | POA: Diagnosis not present

## 2018-06-07 DIAGNOSIS — M7989 Other specified soft tissue disorders: Secondary | ICD-10-CM | POA: Diagnosis not present

## 2018-06-07 DIAGNOSIS — R2243 Localized swelling, mass and lump, lower limb, bilateral: Secondary | ICD-10-CM | POA: Diagnosis not present

## 2018-06-07 DIAGNOSIS — R200 Anesthesia of skin: Secondary | ICD-10-CM | POA: Diagnosis not present

## 2018-06-07 DIAGNOSIS — R5383 Other fatigue: Secondary | ICD-10-CM | POA: Diagnosis not present

## 2018-06-09 ENCOUNTER — Ambulatory Visit: Payer: Self-pay | Admitting: Surgery

## 2018-06-09 DIAGNOSIS — K8010 Calculus of gallbladder with chronic cholecystitis without obstruction: Secondary | ICD-10-CM | POA: Diagnosis not present

## 2018-06-14 ENCOUNTER — Encounter (HOSPITAL_COMMUNITY): Payer: Self-pay

## 2018-06-15 ENCOUNTER — Other Ambulatory Visit: Payer: Self-pay

## 2018-06-15 ENCOUNTER — Encounter (HOSPITAL_COMMUNITY): Payer: Self-pay

## 2018-06-15 ENCOUNTER — Encounter (HOSPITAL_COMMUNITY)
Admission: RE | Admit: 2018-06-15 | Discharge: 2018-06-15 | Disposition: A | Discharge status: Home / Self Care | Payer: Medicare Other | Source: Ambulatory Visit | Attending: Surgery | Admitting: Surgery

## 2018-06-15 DIAGNOSIS — E039 Hypothyroidism, unspecified: Secondary | ICD-10-CM | POA: Insufficient documentation

## 2018-06-15 DIAGNOSIS — Z79890 Hormone replacement therapy (postmenopausal): Secondary | ICD-10-CM | POA: Diagnosis not present

## 2018-06-15 DIAGNOSIS — K819 Cholecystitis, unspecified: Secondary | ICD-10-CM | POA: Diagnosis not present

## 2018-06-15 DIAGNOSIS — Z853 Personal history of malignant neoplasm of breast: Secondary | ICD-10-CM | POA: Insufficient documentation

## 2018-06-15 DIAGNOSIS — Z01812 Encounter for preprocedural laboratory examination: Secondary | ICD-10-CM | POA: Diagnosis not present

## 2018-06-15 DIAGNOSIS — K219 Gastro-esophageal reflux disease without esophagitis: Secondary | ICD-10-CM | POA: Diagnosis not present

## 2018-06-15 DIAGNOSIS — I480 Paroxysmal atrial fibrillation: Secondary | ICD-10-CM | POA: Diagnosis not present

## 2018-06-15 DIAGNOSIS — Z87891 Personal history of nicotine dependence: Secondary | ICD-10-CM | POA: Diagnosis not present

## 2018-06-15 HISTORY — DX: Personal history of urinary calculi: Z87.442

## 2018-06-15 HISTORY — DX: Postsurgical malabsorption, not elsewhere classified: K91.2

## 2018-06-15 HISTORY — DX: Personal history of other medical treatment: Z92.89

## 2018-06-15 HISTORY — DX: Short bowel syndrome, unspecified: K90.829

## 2018-06-15 LAB — CBC
HCT: 29.8 % — ABNORMAL LOW (ref 36.0–46.0)
HEMOGLOBIN: 9.1 g/dL — AB (ref 12.0–15.0)
MCH: 29.9 pg (ref 26.0–34.0)
MCHC: 30.5 g/dL (ref 30.0–36.0)
MCV: 98 fL (ref 78.0–100.0)
PLATELETS: 172 10*3/uL (ref 150–400)
RBC: 3.04 MIL/uL — ABNORMAL LOW (ref 3.87–5.11)
RDW: 13.5 % (ref 11.5–15.5)
WBC: 9 10*3/uL (ref 4.0–10.5)

## 2018-06-15 LAB — BASIC METABOLIC PANEL
Anion gap: 7 (ref 5–15)
BUN: 30 mg/dL — AB (ref 8–23)
CALCIUM: 8.8 mg/dL — AB (ref 8.9–10.3)
CO2: 22 mmol/L (ref 22–32)
CREATININE: 1.25 mg/dL — AB (ref 0.44–1.00)
Chloride: 111 mmol/L (ref 98–111)
GFR calc Af Amer: 43 mL/min — ABNORMAL LOW (ref >60)
GFR calc non Af Amer: 37 mL/min — ABNORMAL LOW (ref >60)
GLUCOSE: 92 mg/dL (ref 70–99)
Potassium: 4.9 mmol/L (ref 3.5–5.1)
Sodium: 140 mmol/L (ref 135–145)

## 2018-06-15 NOTE — Pre-Procedure Instructions (Signed)
Jane Andrews  06/15/2018     Your procedure is scheduled on Monday, July  8.  Report to [facility] Admitting at 8:00 AM.                  Your surgery or procedure is scheduled for 10:00 AM              Call this number if you have problems the morning of surgery: [phone]- pre- op desk.                   For any other questions, please call [phone], Monday - Friday 8 AM - 4 PM. A.M.  Ask for any nurse.  Remember:  Do not eat or drink after midnight.   Take these medicines the morning of surgery with A SIP OF WATER: DULoxetine (CYMBALTA)  levothyroxine (SYNTHROID, LEVOTHROID)  omeprazole (PRILOSEC)  If needed: Simethicone  STOP taking Aspirin, Aspirin Products (Goody Powder, Excedrin Migraine), Ibuprofen (Advil), Naproxen (Aleve), Vitamins and Herbal Products (ie Fish Oil).  Special instructions:  [place]- Preparing For Surgery  Before surgery, you can play an important role. Because skin is not sterile, your skin needs to be as free of germs as possible. You can reduce the number of germs on your skin by washing with CHG (chlorahexidine gluconate) Soap before surgery.  CHG is an antiseptic cleaner which kills germs and bonds with the skin to continue killing germs even after washing.    Oral Hygiene is also important to reduce your risk of infection.  Remember - BRUSH YOUR TEETH THE MORNING OF SURGERY WITH YOUR REGULAR TOOTHPASTE  Please do not use if you have an allergy to CHG or antibacterial soaps. If your skin becomes reddened/irritated stop using the CHG.  Do not shave (including legs and underarms) for at least 48 hours prior to first CHG shower. It is OK to shave your face.  Please follow these instructions carefully.   1. Shower the NIGHT BEFORE SURGERY and the MORNING OF SURGERY with CHG.   2. If you chose to wash your hair, wash your hair first as usual with your normal shampoo.  3. After you shampoo, rinse your hair and body  thoroughly to remove the shampoo. 4.  5. Wash Face and genitals (private parts)  with your normal soap.  6. Use CHG as you would any other liquid soap. You can apply CHG directly to the skin and wash gently with a scrungie or a clean washcloth.   7. Apply the CHG Soap to your body ONLY FROM THE NECK DOWN.  Do not use on open wounds or open sores. Avoid contact with your eyes, ears, mouth and genitals (private parts).   8. Wash thoroughly, paying special attention to the area where your surgery will be performed.  9. Thoroughly rinse your body with warm water from the neck down.  10. DO NOT shower/wash with your normal soap after using and rinsing off the CHG Soap.  11. Pat yourself dry with a CLEAN TOWEL.  12. Wear CLEAN PAJAMAS to bed the night before surgery, wear comfortable clothes the morning of surgery  13. Place CLEAN SHEETS on your bed the night of your first shower and DO NOT SLEEP WITH PETS.  Day of Surgery: Shower as Before Do not apply any deodorants/lotions, powders or colognes.  Please wear clean clothes to the hospital/surgery center.   Remember to brush your teeth WITH YOUR REGULAR TOOTHPASTE.  Do not wear jewelry, make-up or nail polish.  Do not shave 48 hours prior to surgery.  Men may shave face and neck.  Do not bring valuables to the hospital.  [facility] is not responsible for any belongings or valuables.  Contacts, dentures or bridgework may not be worn into surgery.  Leave your suitcase in the car.  After surgery it may be brought to your room.  For patients admitted to the hospital, discharge time will be determined by your treatment team.  Patients discharged the day of surgery will not be allowed to drive home.   Please read over the following fact sheets that you were given: Pain Booklet, Couughing and Deep Breathing, Surgical Site Infections.

## 2018-06-15 NOTE — Pre-Procedure Instructions (Deleted)
Jane Andrews  06/15/2018     Your procedure is scheduled on Monday, July  8.  Report to [facility] Admitting at 8:00 AM.                  Your surgery or procedure is scheduled for 10:00 AM              Call this number if you have problems the morning of surgery: [phone]- pre- op desk.                   For any other questions, please call [phone], Monday - Friday 8 AM - 4 PM. A.M.  Ask for any nurse.  Remember:  Do not eat or drink after midnight.   Take these medicines the morning of surgery with A SIP OF WATER: DULoxetine (CYMBALTA)  levothyroxine (SYNTHROID, LEVOTHROID)  omeprazole (PRILOSEC)  If needed: Simethicone  STOP taking Aspirin, Aspirin Products (Goody Powder, Excedrin Migraine), Ibuprofen (Advil), Naproxen (Aleve), Vitamins and Herbal Products (ie Fish Oil).  Special instructions:  [place]- Preparing For Surgery  Before surgery, you can play an important role. Because skin is not sterile, your skin needs to be as free of germs as possible. You can reduce the number of germs on your skin by washing with CHG (chlorahexidine gluconate) Soap before surgery.  CHG is an antiseptic cleaner which kills germs and bonds with the skin to continue killing germs even after washing.    Oral Hygiene is also important to reduce your risk of infection.  Remember - BRUSH YOUR TEETH THE MORNING OF SURGERY WITH YOUR REGULAR TOOTHPASTE  Please do not use if you have an allergy to CHG or antibacterial soaps. If your skin becomes reddened/irritated stop using the CHG.  Do not shave (including legs and underarms) for at least 48 hours prior to first CHG shower. It is OK to shave your face.  Please follow these instructions carefully.   1. Shower the NIGHT BEFORE SURGERY and the MORNING OF SURGERY with CHG.   2. If you chose to wash your hair, wash your hair first as usual with your normal shampoo.  3. After you shampoo, rinse your hair and body  thoroughly to remove the shampoo. 4.  5. Wash Face and genitals (private parts)  with your normal soap.  6. Use CHG as you would any other liquid soap. You can apply CHG directly to the skin and wash gently with a scrungie or a clean washcloth.   7. Apply the CHG Soap to your body ONLY FROM THE NECK DOWN.  Do not use on open wounds or open sores. Avoid contact with your eyes, ears, mouth and genitals (private parts).   8. Wash thoroughly, paying special attention to the area where your surgery will be performed.  9. Thoroughly rinse your body with warm water from the neck down.  10. DO NOT shower/wash with your normal soap after using and rinsing off the CHG Soap.  11. Pat yourself dry with a CLEAN TOWEL.  12. Wear CLEAN PAJAMAS to bed the night before surgery, wear comfortable clothes the morning of surgery  13. Place CLEAN SHEETS on your bed the night of your first shower and DO NOT SLEEP WITH PETS.  Day of Surgery: Shower as Before Do not apply any deodorants/lotions, powders or colognes.  Please wear clean clothes to the hospital/surgery center.   Remember to brush your teeth WITH YOUR REGULAR TOOTHPASTE.  Do not wear jewelry, make-up or nail polish.  Do not shave 48 hours prior to surgery.  Men may shave face and neck.  Do not bring valuables to the hospital.  [facility] is not responsible for any belongings or valuables.  Contacts, dentures or bridgework may not be worn into surgery.  Leave your suitcase in the car.  After surgery it may be brought to your room.  For patients admitted to the hospital, discharge time will be determined by your treatment team.  Patients discharged the day of surgery will not be allowed to drive home.   Please read over the following fact sheets that you were given: Pain Booklet, Couughing and Deep Breathing, Surgical Site Infections.

## 2018-06-15 NOTE — Progress Notes (Addendum)
Mrs Lambe denies chest pain or shortness of breath.  Patient reports that she is not having any abdominal pain at this time.    Mrs Heagle reports that she went to the Urgent Care in [place] because her feet were swelling and they have never done that before.  (Patient states that she doesn't see PCP much, because it is hard to get an appointment).  Patient could not recall PCP's name. Patient reported that the Dr she saw at Urgent care said he could not find a reason for feet swelling.  Patient was seen 06/07/2018.  I called Urgent Care and asked if patient had a CBC and BMP, I was told yes, I asked if they were WNL , I was told that some were abnormal.  I asked for the labs to be faxed to me, a release was required.  I sent release, but had not gotten results by 11:58.  I asked patient if she remembered any results (I was told she was given a copy).  Patient said that she remember being told that she was anemic.  I asked if the Hemoglobin was less than 10, she said she thinks it was, "it might have been 7 something."  I asked if she was instructed to follow up with her Dr, she said no. I had lab draw a CBC and BMP.  Hemoglobin at Urgent Care on 06/07/18 was 9.3, today's hemoglobin was 9.1.  I have requested office note and Vital signs from Urgent Care. I have asked anesthesiology  PA/ NP-C to review chart.

## 2018-06-16 NOTE — Progress Notes (Signed)
Anesthesia Chart Review:  Case:  [id number] Date/Time:  06/21/18 0945   Procedure:  LAPAROSCOPIC CHOLECYSTECTOMY WITH INTRAOPERATIVE CHOLANGIOGRAM POSSIBLE OPEN (N/A )   Anesthesia type:  General   Pre-op diagnosis:  CHRONIC CHOLCYSTITITS   Location:  [facility] OR ROOM 02 / [facility]   Surgeon:  Armandina Gemma, MD      DISCUSSION: 84 yo female former smoker scheduled for above procedure. Pertinent hx includes GERD, anemia, hypothyroidism, paroxysmal afib (not on anticoagulation, NSR per last EKG), breast CA with right mastectomy and prosthesis.  Pt noted to have anemia on PAT labs. Review of recent labwork shows hgb trending down recently. She has distant history of PUD and GIB. Pt has had multiple prior abdominal surgeries and as such there is higher likelihood of needing to convert to open procedure - Will order DOS type and screen. Results forwarded to GI and called to surgeon's office Hemoglobin (g/dL)  Date Value  06/15/2018 9.1 (L)  04/13/2018 10.2 (L)  03/08/2018 10.7 (L)  03/01/2018 11.5 (L)   At PAT appt pt also reported she was recently seen at [facility] 06/07/2018 for new bilateral foot swelling. Notes from UC reviewed indicate pt reported foot "tightness". It was noted that the pt was afebrile, on exam there was no swelling, no erythema, minimal TTP.  Per PAT nurse the pt had very mild bilateral foot swelling on exam and pt reported it was improving. She denied any cardiopulmonary symptoms, no CP or SOB.  Expect she can proceed with surgery as scheduled barring any acute status change.  VS: BP (!) 154/77   Pulse 77   Temp 37 C (Oral)   Resp 20   Ht 5\' 7"  (1.702 m)   Wt 119 lb (54 kg)   SpO2 98%   BMI 18.64 kg/m   PROVIDERS: [employer], MD is PCP, pt states she has not seen in many years.  Rogene Houston, MD is gastroenterologist last seen by Deberah Castle NP on 05/18/2018. Pt is followed for normochromic normocytic anemia. She is also being followed for recent weight loss of 11 lbs  over last 10 months.   LABS: Pt anemic with Hgb recently trending down. Will order DOS type and screen. Results called to surgeon's office (all labs ordered are listed, but only abnormal results are displayed)  Labs Reviewed  BASIC METABOLIC PANEL - Abnormal; Notable for the following components:      Result Value   BUN 30 (*)    Creatinine, Ser 1.25 (*)    Calcium 8.8 (*)    GFR calc non Af Amer 37 (*)    GFR calc Af Amer 43 (*)    All other components within normal limits  CBC - Abnormal; Notable for the following components:   RBC 3.04 (*)    Hemoglobin 9.1 (*)    HCT 29.8 (*)    All other components within normal limits     IMAGES: CXR 03/06/2018: Normal heart size, mediastinal contours, and pulmonary vascularity. Lungs clear. No pleural effusion or pneumothorax. Prior RIGHT mastectomy with axillary node dissection and breast prosthesis noted. No acute osseus findings.   EKG: 08/24/2017: NSR with borderline LAD. Abnormal R wave progression, early transition. No significant interval changes.   CV: TTE 10/21/2012: - Left ventricle: The cavity size was normal. Wall thickness was increased in a pattern of mild LVH. There was mild focal basal hypertrophy of the septum. Systolic function was normal. The estimated ejection fraction was in the range of 55% to  60%. - Mitral valve: Mild regurgitation. - Left atrium: The atrium was mildly dilated. - Atrial septum: No defect or patent foramen ovale was identified. - Pulmonary arteries: PA peak pressure: 62mm Hg (S). - Pericardium, extracardiac: A trivial pericardial effusion was identified posterior to the heart.  Past Medical History:  Diagnosis Date  . Abdominal adhesions 1994  . Allergic rhinitis   . Anemia   . Anxiety and depression   . Arthritis   . Atrial fibrillation ([place]) 10/2012   Associated with severe anemia and esophageal pill impaction  . Breast carcinoma ([place])    right mastectomy "25+ years ago"   . Cholelithiasis    asymptomatic  . Gastroesophageal reflux disease    Hiatal hernia  . Heart murmur    "nothing to be concerned about  . History of blood transfusion   . History of kidney stones    Xray   . Hypertension    not on medication  . Hypothyroid   . Low back pain   . 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 Nephrolithiasis 2004   painless hematuria  . Short gut syndrome    bowel resection , 2004  . Upper GI bleed 2004   Multiple episodes of melena-? due to gastritis or adverse drug effect (nonsteroidals, small bowel ulceration with Fosamax); caused by Pepto-Bismol during one Emergency Department evaluation    Past Surgical History:  Procedure Laterality Date  . ABDOMINAL HYSTERECTOMY     emergency s/p delivery  . ABDOMINAL HYSTERECTOMY  1960   massive gynecologic bleeding  . BOWEL RESECTION     Resulting short gut syndrome  . COLONOSCOPY W/ POLYPECTOMY  2005   Lipoma; diverticulosis  . COLONOSCOPY WITH ESOPHAGOGASTRODUODENOSCOPY (EGD)  11/22/2012   [facility]  . LAPAROSCOPIC LYSIS OF ADHESIONS  1965   s/p adhesions  . MASTECTOMY  right breast  . MASTECTOMY     Carcinoma of the breast; right  . UPPER GASTROINTESTINAL ENDOSCOPY      MEDICATIONS: . ALPRAZolam (XANAX) 1 MG tablet  . Calcium Carb-Cholecalciferol (CALCIUM+D3) 600-800 MG-UNIT TABS  . Cyanocobalamin (VITAMIN B-12 IJ)  . diclofenac (VOLTAREN) 75 MG EC tablet  . DULoxetine (CYMBALTA) 60 MG capsule  . levothyroxine (SYNTHROID, LEVOTHROID) 75 MCG tablet  . Multiple Vitamin (MULTIVITAMIN) tablet  . Omega-3 Fatty Acids (FISH OIL) 1000 MG CAPS  . omeprazole (PRILOSEC) 40 MG capsule  . Simethicone 180 MG CAPS   No current facility-administered medications for this encounter.     Wynonia Musty [facility] Short Stay Center/Anesthesiology Phone [phone] 06/18/2018 1:41 PM

## 2018-06-18 ENCOUNTER — Encounter (HOSPITAL_COMMUNITY): Payer: Self-pay | Admitting: Surgery

## 2018-06-18 DIAGNOSIS — K8010 Calculus of gallbladder with chronic cholecystitis without obstruction: Secondary | ICD-10-CM | POA: Diagnosis present

## 2018-06-18 NOTE — H&P (Signed)
General Surgery [facility]  Jane Andrews DOB: 1925-01-09 Single / Language: Cleophus Molt / Race: White Female   History of Present Illness  The patient is a 84 year old female who presents for evaluation of gall stones.  CHIEF COMPLAINT: symptomatic gallstones  Patient is referred by Dr. Laural Golden and Deberah Castle, NP, for surgical evaluation and management of symptomatic cholelithiasis and chronic cholecystitis. Patient's primary care physician is Dr. Redmond School. Patient has a history of intermittent right upper quadrant abdominal pain. This occurs every few weeks. It'll last for approximately 1-1-1/2 days. Patient denies any nausea or vomiting. She denies fevers or chills. She denies jaundice or acholic stools. Patient has had no prior hepatobiliary or pancreatic disease. Patient has had prior abdominal surgery including abdominal hysterectomy, small bowel resection, and lysis of adhesions. Patient presents today accompanied by her family to discuss cholecystectomy. She was previously evaluated for gallbladder surgery by the general surgery practice in [place]. However, based on age and comorbidities, the patient is referred to [facility] and the patient request care at [facility].   Past Surgical History Appendectomy  Breast Mass; Local Excision  Right. Breast Reconstruction  Right. Cataract Surgery  Bilateral. Hysterectomy (not due to cancer) - Complete  Mastectomy  Right. Resection of Small Bowel   Diagnostic Studies History Colonoscopy  1-5 years ago Mammogram  1-3 years ago Pap Smear  >5 years ago  Allergies Codeine Phosphate *ANALGESICS - OPIOID*  Allergies Reconciled   Medication History Xanax (1MG  Tablet, Oral) Active. Vitamin B-12 (Oral) Specific strength unknown - Active. Cymbalta (60MG  Capsule DR Part, Oral) Active. Levothyroxine Sodium (75MCG Tablet, Oral) Active. Multi-Vitamin (Oral)  Active. Omega-3 (1000MG  Capsule, Oral) Active. Voltaren (75MG  Tablet DR, Oral) Active. Omeprazole (20MG  Capsule DR, Oral) Active. Medications Reconciled  Social History Alcohol use  Occasional alcohol use. Caffeine use  Coffee. No drug use  Tobacco use  Former smoker.  Family History Alcohol Abuse  Brother, Father. Bleeding disorder  Family Members In General. Depression  Son.  Pregnancy / Birth History Age at menarche  66 years. Age of menopause  <47 Gravida  2 Maternal age  73-30 Para  1 Regular periods   Other Problems Back Pain  Breast Cancer  Cholelithiasis  Heart murmur  Kidney Stone  Oophorectomy  Bilateral. Transfusion history   Review of Systems General Present- Weight Loss. Not Present- Appetite Loss, Chills, Fatigue, Fever, Night Sweats and Weight Gain. Skin Not Present- Change in Wart/Mole, Dryness, Hives, Jaundice, New Lesions, Non-Healing Wounds, Rash and Ulcer. HEENT Present- Ringing in the Ears and Wears glasses/contact lenses. Not Present- Earache, Hearing Loss, Hoarseness, Nose Bleed, Oral Ulcers, Seasonal Allergies, Sinus Pain, Sore Throat, Visual Disturbances and Yellow Eyes. Respiratory Not Present- Bloody sputum, Chronic Cough, Difficulty Breathing, Snoring and Wheezing. Breast Not Present- Breast Mass, Breast Pain, Nipple Discharge and Skin Changes. Gastrointestinal Present- Abdominal Pain, Chronic diarrhea, Constipation and Excessive gas. Not Present- Bloating, Bloody Stool, Change in Bowel Habits, Difficulty Swallowing, Gets full quickly at meals, Hemorrhoids, Indigestion, Nausea, Rectal Pain and Vomiting. Female Genitourinary Not Present- Frequency, Nocturia, Painful Urination, Pelvic Pain and Urgency. Musculoskeletal Present- Back Pain. Not Present- Joint Pain, Joint Stiffness, Muscle Pain, Muscle Weakness and Swelling of Extremities. Neurological Present- Decreased Memory. Not Present- Fainting, Headaches, Numbness,  Seizures, Tingling, Tremor, Trouble walking and Weakness. Psychiatric Not Present- Anxiety, Bipolar, Change in Sleep Pattern, Depression, Fearful and Frequent crying. Endocrine Not Present- Cold Intolerance, Excessive Hunger, Hair Changes, Heat Intolerance, Hot flashes and New Diabetes.  Hematology Present- Easy Bruising. Not Present- Blood Thinners, Excessive bleeding, Gland problems, HIV and Persistent Infections.  Vitals Weight: 119.8 lb Height: 61in Body Surface Area: 1.52 m Body Mass Index: 22.64 kg/m  Temp.: 98.77F  Pulse: 88 (Regular)  BP: 134/76 (Sitting, Left Arm, Standard)   Physical Exam  See vital signs recorded above  GENERAL APPEARANCE Development: normal Nutritional status: normal Gross deformities: none  SKIN Rash, lesions, ulcers: none Induration, erythema: none Nodules: none palpable  EYES Conjunctiva and lids: normal Pupils: equal and reactive Iris: normal bilaterally  EARS, NOSE, MOUTH, THROAT External ears: no lesion or deformity External nose: no lesion or deformity Hearing: grossly normal Lips: no lesion or deformity Dentition: normal for age Oral mucosa: moist  NECK Symmetric: yes Trachea: midline Thyroid: no palpable nodules in the thyroid bed  CHEST Respiratory effort: normal Retraction or accessory muscle use: no Breath sounds: normal bilaterally Rales, rhonchi, wheeze: none  CARDIOVASCULAR Auscultation: regular rhythm, normal rate, occasional "skip beat" Murmurs: grade II systolic Pulses: carotid and radial pulse 2+ palpable Lower extremity edema: none Lower extremity varicosities: none  ABDOMEN Distension: none Masses: none palpable Tenderness: none Hepatosplenomegaly: not present Hernia: Small incisional hernia versus attenuation of the fascia at the level of the umbilicus in the midportion of her midline surgical incision Well-healed midline surgical incision extending from the epigastrium to nearly the  pubis.  MUSCULOSKELETAL Station and gait: normal Digits and nails: no clubbing or cyanosis Muscle strength: grossly normal all extremities Range of motion: grossly normal all extremities Deformity: none  LYMPHATIC Cervical: none palpable Supraclavicular: none palpable  PSYCHIATRIC Oriented to person, place, and time: yes Mood and affect: normal for situation Judgment and insight: appropriate for situation    Assessment & Plan  CHOLELITHIASIS WITH CHRONIC CHOLECYSTITIS (K80.10)  Pt Education - Pamphlet Given - Laparoscopic Gallbladder Surgery: discussed with patient and provided information. Patient presents on referral from her gastroenterology practicing in [place] for evaluation and management of symptomatic cholelithiasis and chronic cholecystitis. Patient is accompanied by a family member. She is provided with written literature on gallbladder surgery to review at home.  Patient has CT scan and ultrasound documenting cholelithiasis. Patient has a history of intermittent biliary colic with right upper quadrant abdominal pain lasting 1-1-1/2 days. She does not associate this with any particular meals. She denies any history of jaundice or acholic stools. Patient does have a significant past surgical history with abdominal hysterectomy, lysis of adhesions, and small bowel resection.  I recommended cholecystectomy. We will try to perform this by laparoscopic technique. However, based on prior surgery had the possibility of significant adhesions, the patient may require open cholecystectomy. We discussed this risk. We discussed the need for intraoperative cholangiography. We discussed the hospital stay to be anticipated and her postoperative recovery. Patient understands and wishes to proceed with surgery in the near future.  The risks and benefits of the procedure have been discussed at length with the patient. The patient understands the proposed procedure, potential  alternative treatments, and the course of recovery to be expected. All of the patient's questions have been answered at this time. The patient wishes to proceed with surgery.   Armandina Gemma, [place] [facility] Office: [phone]

## 2018-06-22 NOTE — Progress Notes (Signed)
Spoke with patient niece patrice and patient is aware of surgery date and time and will arrive 1000 am [place] l;ong admitting and follow all preoperative instructions given at [facility] pre op visit.

## 2018-06-22 NOTE — Progress Notes (Signed)
Patient called and stated she received instructions where to report for 06-25-18 surgery

## 2018-06-23 ENCOUNTER — Telehealth (INDEPENDENT_AMBULATORY_CARE_PROVIDER_SITE_OTHER): Payer: Self-pay | Admitting: Internal Medicine

## 2018-06-23 NOTE — Telephone Encounter (Signed)
Jane Andrews, CBC in 4 weeks 

## 2018-06-24 ENCOUNTER — Encounter (INDEPENDENT_AMBULATORY_CARE_PROVIDER_SITE_OTHER): Payer: Self-pay

## 2018-06-24 ENCOUNTER — Encounter (HOSPITAL_COMMUNITY): Payer: Self-pay | Admitting: *Deleted

## 2018-06-25 ENCOUNTER — Ambulatory Visit (HOSPITAL_COMMUNITY): Payer: Medicare Other | Admitting: Physician Assistant

## 2018-06-25 ENCOUNTER — Ambulatory Visit (HOSPITAL_COMMUNITY): Payer: Medicare Other

## 2018-06-25 ENCOUNTER — Encounter (HOSPITAL_COMMUNITY): Payer: Self-pay | Admitting: *Deleted

## 2018-06-25 ENCOUNTER — Other Ambulatory Visit: Payer: Self-pay

## 2018-06-25 ENCOUNTER — Encounter (HOSPITAL_COMMUNITY)
Admission: RE | Disposition: A | Discharge status: Home / Self Care | Payer: Self-pay | Source: Ambulatory Visit | Attending: Surgery

## 2018-06-25 ENCOUNTER — Observation Stay (HOSPITAL_COMMUNITY)
Admission: RE | Admit: 2018-06-25 | Discharge: 2018-06-26 | Disposition: A | Discharge status: Home / Self Care | Payer: Medicare Other | Source: Ambulatory Visit | Attending: Surgery | Admitting: Surgery

## 2018-06-25 DIAGNOSIS — Z885 Allergy status to narcotic agent status: Secondary | ICD-10-CM | POA: Insufficient documentation

## 2018-06-25 DIAGNOSIS — Z9049 Acquired absence of other specified parts of digestive tract: Secondary | ICD-10-CM | POA: Insufficient documentation

## 2018-06-25 DIAGNOSIS — Z825 Family history of asthma and other chronic lower respiratory diseases: Secondary | ICD-10-CM | POA: Insufficient documentation

## 2018-06-25 DIAGNOSIS — Z79899 Other long term (current) drug therapy: Secondary | ICD-10-CM | POA: Insufficient documentation

## 2018-06-25 DIAGNOSIS — Z853 Personal history of malignant neoplasm of breast: Secondary | ICD-10-CM | POA: Insufficient documentation

## 2018-06-25 DIAGNOSIS — E039 Hypothyroidism, unspecified: Secondary | ICD-10-CM | POA: Diagnosis not present

## 2018-06-25 DIAGNOSIS — I10 Essential (primary) hypertension: Secondary | ICD-10-CM | POA: Insufficient documentation

## 2018-06-25 DIAGNOSIS — K449 Diaphragmatic hernia without obstruction or gangrene: Secondary | ICD-10-CM | POA: Insufficient documentation

## 2018-06-25 DIAGNOSIS — I4891 Unspecified atrial fibrillation: Secondary | ICD-10-CM | POA: Diagnosis not present

## 2018-06-25 DIAGNOSIS — F419 Anxiety disorder, unspecified: Secondary | ICD-10-CM | POA: Insufficient documentation

## 2018-06-25 DIAGNOSIS — K909 Intestinal malabsorption, unspecified: Secondary | ICD-10-CM | POA: Diagnosis not present

## 2018-06-25 DIAGNOSIS — F329 Major depressive disorder, single episode, unspecified: Secondary | ICD-10-CM | POA: Insufficient documentation

## 2018-06-25 DIAGNOSIS — Z832 Family history of diseases of the blood and blood-forming organs and certain disorders involving the immune mechanism: Secondary | ICD-10-CM | POA: Insufficient documentation

## 2018-06-25 DIAGNOSIS — D649 Anemia, unspecified: Secondary | ICD-10-CM | POA: Insufficient documentation

## 2018-06-25 DIAGNOSIS — Z419 Encounter for procedure for purposes other than remedying health state, unspecified: Secondary | ICD-10-CM

## 2018-06-25 DIAGNOSIS — Z87442 Personal history of urinary calculi: Secondary | ICD-10-CM | POA: Diagnosis not present

## 2018-06-25 DIAGNOSIS — R011 Cardiac murmur, unspecified: Secondary | ICD-10-CM | POA: Insufficient documentation

## 2018-06-25 DIAGNOSIS — M1990 Unspecified osteoarthritis, unspecified site: Secondary | ICD-10-CM | POA: Insufficient documentation

## 2018-06-25 DIAGNOSIS — Z90710 Acquired absence of both cervix and uterus: Secondary | ICD-10-CM | POA: Insufficient documentation

## 2018-06-25 DIAGNOSIS — Z87891 Personal history of nicotine dependence: Secondary | ICD-10-CM | POA: Insufficient documentation

## 2018-06-25 DIAGNOSIS — Z9841 Cataract extraction status, right eye: Secondary | ICD-10-CM | POA: Insufficient documentation

## 2018-06-25 DIAGNOSIS — Z811 Family history of alcohol abuse and dependence: Secondary | ICD-10-CM | POA: Diagnosis not present

## 2018-06-25 DIAGNOSIS — K219 Gastro-esophageal reflux disease without esophagitis: Secondary | ICD-10-CM | POA: Diagnosis not present

## 2018-06-25 DIAGNOSIS — Z980 Intestinal bypass and anastomosis status: Secondary | ICD-10-CM | POA: Insufficient documentation

## 2018-06-25 DIAGNOSIS — Z818 Family history of other mental and behavioral disorders: Secondary | ICD-10-CM | POA: Insufficient documentation

## 2018-06-25 DIAGNOSIS — Z9011 Acquired absence of right breast and nipple: Secondary | ICD-10-CM | POA: Insufficient documentation

## 2018-06-25 DIAGNOSIS — K8010 Calculus of gallbladder with chronic cholecystitis without obstruction: Principal | ICD-10-CM | POA: Insufficient documentation

## 2018-06-25 DIAGNOSIS — K828 Other specified diseases of gallbladder: Secondary | ICD-10-CM | POA: Insufficient documentation

## 2018-06-25 DIAGNOSIS — Z9842 Cataract extraction status, left eye: Secondary | ICD-10-CM | POA: Insufficient documentation

## 2018-06-25 DIAGNOSIS — Z8261 Family history of arthritis: Secondary | ICD-10-CM | POA: Insufficient documentation

## 2018-06-25 DIAGNOSIS — D135 Benign neoplasm of extrahepatic bile ducts: Secondary | ICD-10-CM | POA: Insufficient documentation

## 2018-06-25 DIAGNOSIS — M549 Dorsalgia, unspecified: Secondary | ICD-10-CM | POA: Diagnosis not present

## 2018-06-25 DIAGNOSIS — J309 Allergic rhinitis, unspecified: Secondary | ICD-10-CM | POA: Insufficient documentation

## 2018-06-25 DIAGNOSIS — D131 Benign neoplasm of stomach: Secondary | ICD-10-CM | POA: Diagnosis not present

## 2018-06-25 DIAGNOSIS — N183 Chronic kidney disease, stage 3 (moderate): Secondary | ICD-10-CM | POA: Diagnosis not present

## 2018-06-25 DIAGNOSIS — I129 Hypertensive chronic kidney disease with stage 1 through stage 4 chronic kidney disease, or unspecified chronic kidney disease: Secondary | ICD-10-CM | POA: Diagnosis not present

## 2018-06-25 DIAGNOSIS — Z809 Family history of malignant neoplasm, unspecified: Secondary | ICD-10-CM | POA: Insufficient documentation

## 2018-06-25 HISTORY — PX: CHOLECYSTECTOMY: SHX55

## 2018-06-25 LAB — TYPE AND SCREEN
ABO/RH(D): O POS
ANTIBODY SCREEN: NEGATIVE

## 2018-06-25 LAB — ABO/RH: ABO/RH(D): O POS

## 2018-06-25 SURGERY — LAPAROSCOPIC CHOLECYSTECTOMY WITH INTRAOPERATIVE CHOLANGIOGRAM
Anesthesia: General | Site: Abdomen

## 2018-06-25 MED ORDER — SUGAMMADEX SODIUM 200 MG/2ML IV SOLN
INTRAVENOUS | Status: DC | PRN
  Administered 2018-06-25: 200 mg via INTRAVENOUS

## 2018-06-25 MED ORDER — LACTATED RINGERS IR SOLN
Status: DC | PRN
  Administered 2018-06-25: 1000 mL

## 2018-06-25 MED ORDER — LIDOCAINE HCL (CARDIAC) PF 100 MG/5ML IV SOSY
PREFILLED_SYRINGE | INTRAVENOUS | Status: DC | PRN
  Administered 2018-06-25: 60 mg via INTRAVENOUS

## 2018-06-25 MED ORDER — MEPERIDINE HCL 50 MG/ML IJ SOLN: 6.2500 mg | INTRAMUSCULAR | Status: DC | PRN

## 2018-06-25 MED ORDER — LACTATED RINGERS IV SOLN
INTRAVENOUS | Status: DC
  Administered 2018-06-25: 10:00:00 via INTRAVENOUS

## 2018-06-25 MED ORDER — ONDANSETRON 4 MG PO TBDP
4.0000 mg | ORAL_TABLET | Freq: Four times a day (QID) | ORAL | Status: DC | PRN
  Administered 2018-06-26: 4 mg via ORAL
  Filled 2018-06-25: qty 1

## 2018-06-25 MED ORDER — ROCURONIUM BROMIDE 100 MG/10ML IV SOLN
INTRAVENOUS | Status: AC
  Filled 2018-06-25: qty 1

## 2018-06-25 MED ORDER — FENTANYL CITRATE (PF) 100 MCG/2ML IJ SOLN
INTRAMUSCULAR | Status: AC
  Filled 2018-06-25: qty 2

## 2018-06-25 MED ORDER — TRAMADOL HCL 50 MG PO TABS: 50.0000 mg | ORAL_TABLET | Freq: Four times a day (QID) | ORAL | 0 refills | Status: DC | PRN

## 2018-06-25 MED ORDER — ACETAMINOPHEN 325 MG PO TABS: 650.0000 mg | ORAL_TABLET | Freq: Four times a day (QID) | ORAL | Status: DC | PRN

## 2018-06-25 MED ORDER — HYDROCODONE-ACETAMINOPHEN 5-325 MG PO TABS
1.0000 | ORAL_TABLET | ORAL | Status: DC | PRN
  Administered 2018-06-26 (×2): 1 via ORAL
  Filled 2018-06-25 (×3): qty 1

## 2018-06-25 MED ORDER — LIDOCAINE 2% (20 MG/ML) 5 ML SYRINGE
INTRAMUSCULAR | Status: AC
  Filled 2018-06-25: qty 5

## 2018-06-25 MED ORDER — ONDANSETRON HCL 4 MG/2ML IJ SOLN
INTRAMUSCULAR | Status: AC
  Filled 2018-06-25: qty 2

## 2018-06-25 MED ORDER — PROPOFOL 10 MG/ML IV BOLUS
INTRAVENOUS | Status: AC
  Filled 2018-06-25: qty 20

## 2018-06-25 MED ORDER — ACETAMINOPHEN 10 MG/ML IV SOLN: 1000.0000 mg | Freq: Once | INTRAVENOUS | Status: DC | PRN

## 2018-06-25 MED ORDER — KCL IN DEXTROSE-NACL 20-5-0.45 MEQ/L-%-% IV SOLN
INTRAVENOUS | Status: DC
  Administered 2018-06-25: 17:00:00 via INTRAVENOUS
  Filled 2018-06-25: qty 1000

## 2018-06-25 MED ORDER — FENTANYL CITRATE (PF) 100 MCG/2ML IJ SOLN
25.0000 ug | INTRAMUSCULAR | Status: DC | PRN
  Administered 2018-06-25 (×2): 50 ug via INTRAVENOUS

## 2018-06-25 MED ORDER — PHENYLEPHRINE 40 MCG/ML (10ML) SYRINGE FOR IV PUSH (FOR BLOOD PRESSURE SUPPORT)
PREFILLED_SYRINGE | INTRAVENOUS | Status: AC
  Filled 2018-06-25: qty 10

## 2018-06-25 MED ORDER — TRAMADOL HCL 50 MG PO TABS: 50.0000 mg | ORAL_TABLET | Freq: Four times a day (QID) | ORAL | Status: DC | PRN

## 2018-06-25 MED ORDER — HYDROMORPHONE HCL 1 MG/ML IJ SOLN
0.5000 mg | INTRAMUSCULAR | Status: DC | PRN
  Administered 2018-06-25: 0.5 mg via INTRAVENOUS
  Filled 2018-06-25: qty 0.5

## 2018-06-25 MED ORDER — PHENYLEPHRINE HCL 10 MG/ML IJ SOLN
INTRAMUSCULAR | Status: AC
  Filled 2018-06-25: qty 1

## 2018-06-25 MED ORDER — SUGAMMADEX SODIUM 200 MG/2ML IV SOLN
INTRAVENOUS | Status: AC
  Filled 2018-06-25: qty 2

## 2018-06-25 MED ORDER — ONDANSETRON HCL 4 MG/2ML IJ SOLN
4.0000 mg | Freq: Four times a day (QID) | INTRAMUSCULAR | Status: DC | PRN
  Administered 2018-06-25: 4 mg via INTRAVENOUS
  Filled 2018-06-25: qty 2

## 2018-06-25 MED ORDER — PROPOFOL 10 MG/ML IV BOLUS
INTRAVENOUS | Status: DC | PRN
  Administered 2018-06-25: 100 mg via INTRAVENOUS

## 2018-06-25 MED ORDER — PROMETHAZINE HCL 25 MG/ML IJ SOLN: 6.2500 mg | INTRAMUSCULAR | Status: DC | PRN

## 2018-06-25 MED ORDER — FENTANYL CITRATE (PF) 100 MCG/2ML IJ SOLN
INTRAMUSCULAR | Status: DC | PRN
  Administered 2018-06-25: 25 ug via INTRAVENOUS
  Administered 2018-06-25: 50 ug via INTRAVENOUS
  Administered 2018-06-25: 25 ug via INTRAVENOUS
  Administered 2018-06-25: 50 ug via INTRAVENOUS
  Administered 2018-06-25 (×2): 25 ug via INTRAVENOUS

## 2018-06-25 MED ORDER — DULOXETINE HCL 60 MG PO CPEP
60.0000 mg | ORAL_CAPSULE | Freq: Every morning | ORAL | Status: DC
  Filled 2018-06-25: qty 1

## 2018-06-25 MED ORDER — IOPAMIDOL (ISOVUE-300) INJECTION 61%
INTRAVENOUS | Status: DC | PRN
  Administered 2018-06-25: 7.5 mL via INTRAVENOUS

## 2018-06-25 MED ORDER — CHLORHEXIDINE GLUCONATE CLOTH 2 % EX PADS: 6.0000 | MEDICATED_PAD | Freq: Once | CUTANEOUS | Status: DC

## 2018-06-25 MED ORDER — CEFAZOLIN SODIUM-DEXTROSE 2-4 GM/100ML-% IV SOLN
2.0000 g | INTRAVENOUS | Status: AC
  Administered 2018-06-25: 2 g via INTRAVENOUS
  Filled 2018-06-25: qty 100

## 2018-06-25 MED ORDER — IOPAMIDOL (ISOVUE-300) INJECTION 61%
INTRAVENOUS | Status: AC
  Filled 2018-06-25: qty 50

## 2018-06-25 MED ORDER — ACETAMINOPHEN 650 MG RE SUPP: 650.0000 mg | Freq: Four times a day (QID) | RECTAL | Status: DC | PRN

## 2018-06-25 MED ORDER — LEVOTHYROXINE SODIUM 75 MCG PO TABS
75.0000 ug | ORAL_TABLET | Freq: Every day | ORAL | Status: DC
  Administered 2018-06-26: 75 ug via ORAL
  Filled 2018-06-25: qty 1

## 2018-06-25 MED ORDER — ROCURONIUM BROMIDE 100 MG/10ML IV SOLN
INTRAVENOUS | Status: DC | PRN
  Administered 2018-06-25: 30 mg via INTRAVENOUS
  Administered 2018-06-25: 10 mg via INTRAVENOUS

## 2018-06-25 MED ORDER — FENTANYL CITRATE (PF) 250 MCG/5ML IJ SOLN
INTRAMUSCULAR | Status: AC
Start: 2018-06-25 — End: ?
  Filled 2018-06-25: qty 5

## 2018-06-25 MED ORDER — BUPIVACAINE-EPINEPHRINE 0.25% -1:200000 IJ SOLN
INTRAMUSCULAR | Status: DC | PRN
  Administered 2018-06-25: 20 mL

## 2018-06-25 MED ORDER — ONDANSETRON HCL 4 MG/2ML IJ SOLN
INTRAMUSCULAR | Status: DC | PRN
  Administered 2018-06-25: 4 mg via INTRAVENOUS

## 2018-06-25 MED ORDER — BUPIVACAINE-EPINEPHRINE (PF) 0.25% -1:200000 IJ SOLN
INTRAMUSCULAR | Status: AC
  Filled 2018-06-25: qty 30

## 2018-06-25 MED ORDER — 0.9 % SODIUM CHLORIDE (POUR BTL) OPTIME
TOPICAL | Status: DC | PRN
  Administered 2018-06-25: 1000 mL

## 2018-06-25 SURGICAL SUPPLY — 35 items
APPLIER CLIP ROT 10 11.4 M/L (STAPLE) ×3
APR CLP MED LRG 11.4X10 (STAPLE) ×1
BAG SPEC RTRVL LRG 6X4 10 (ENDOMECHANICALS) ×1
CABLE HIGH FREQUENCY MONO STRZ (ELECTRODE) ×3 IMPLANT
CHLORAPREP W/TINT 26ML (MISCELLANEOUS) ×3 IMPLANT
CLIP APPLIE ROT 10 11.4 M/L (STAPLE) ×1 IMPLANT
CLOSURE WOUND 1/2 X4 (GAUZE/BANDAGES/DRESSINGS) ×1
COVER MAYO STAND STRL (DRAPES) ×3 IMPLANT
COVER SURGICAL LIGHT HANDLE (MISCELLANEOUS) ×3 IMPLANT
DRAPE C-ARM 42X120 X-RAY (DRAPES) ×3 IMPLANT
ELECT REM PT RETURN 15FT ADLT (MISCELLANEOUS) ×3 IMPLANT
GAUZE SPONGE 2X2 8PLY STRL LF (GAUZE/BANDAGES/DRESSINGS) ×1 IMPLANT
GLOVE SURG ORTHO 8.0 STRL STRW (GLOVE) ×3 IMPLANT
GOWN STRL REUS W/TWL XL LVL3 (GOWN DISPOSABLE) ×8 IMPLANT
HEMOSTAT SURGICEL 4X8 (HEMOSTASIS) IMPLANT
KIT BASIN OR (CUSTOM PROCEDURE TRAY) ×3 IMPLANT
NS IRRIG 1000ML POUR BTL (IV SOLUTION) ×3 IMPLANT
POUCH SPECIMEN RETRIEVAL 10MM (ENDOMECHANICALS) ×3 IMPLANT
SCISSORS LAP 5X35 DISP (ENDOMECHANICALS) ×3 IMPLANT
SET CHOLANGIOGRAPH MIX (MISCELLANEOUS) ×3 IMPLANT
SET IRRIG TUBING LAPAROSCOPIC (IRRIGATION / IRRIGATOR) ×3 IMPLANT
SLEEVE ENDOPATH XCEL 5M (ENDOMECHANICALS) ×3 IMPLANT
SLEEVE XCEL OPT CAN 5 100 (ENDOMECHANICALS) ×3 IMPLANT
SPONGE GAUZE 2X2 STER 10/PKG (GAUZE/BANDAGES/DRESSINGS) ×2
STRIP CLOSURE SKIN 1/2X4 (GAUZE/BANDAGES/DRESSINGS) ×2 IMPLANT
SUT MNCRL AB 4-0 PS2 18 (SUTURE) ×3 IMPLANT
TAPE CLOTH SURG 4X10 WHT LF (GAUZE/BANDAGES/DRESSINGS) ×2 IMPLANT
TOWEL OR 17X26 10 PK STRL BLUE (TOWEL DISPOSABLE) ×3 IMPLANT
TOWEL OR NON WOVEN STRL DISP B (DISPOSABLE) ×3 IMPLANT
TRAY LAPAROSCOPIC (CUSTOM PROCEDURE TRAY) ×3 IMPLANT
TROCAR BLADELESS OPT 5 100 (ENDOMECHANICALS) ×3 IMPLANT
TROCAR XCEL BLUNT TIP 100MML (ENDOMECHANICALS) ×3 IMPLANT
TROCAR XCEL NON-BLD 11X100MML (ENDOMECHANICALS) ×3 IMPLANT
TROCAR XCEL NON-BLD 5MMX100MML (ENDOMECHANICALS) ×3 IMPLANT
TUBING INSUFFLATION (TUBING) ×3 IMPLANT

## 2018-06-25 NOTE — Anesthesia Postprocedure Evaluation (Signed)
Anesthesia Post Note  Patient: Jane Andrews  Procedure(s) Performed: LAPAROSCOPIC CHOLECYSTECTOMY WITH INTRAOPERATIVE CHOLANGIOGRAM (N/A Abdomen)     Patient location during evaluation: PACU Anesthesia Type: General Level of consciousness: awake Pain management: pain level controlled Vital Signs Assessment: post-procedure vital signs reviewed and stable Respiratory status: spontaneous breathing Cardiovascular status: stable Postop Assessment: no apparent nausea or vomiting Anesthetic complications: no    Last Vitals:  Vitals:   06/25/18 1445 06/25/18 1506  BP:  (!) 169/79  Pulse:  92  Resp:  14  Temp: 36.4 C   SpO2:  98%    Last Pain:  Vitals:   06/25/18 1445  TempSrc:   PainSc: 2    Pain Goal: Patients Stated Pain Goal: 3 (06/25/18 1021)               Hellen Shanley JR,JOHN [facility]

## 2018-06-25 NOTE — Op Note (Signed)
Procedure Note  Pre-operative Diagnosis:  Chronic cholecystitis, cholelithiasis  Post-operative Diagnosis:  same  Surgeon:  Armandina Gemma, MD  Assistant:  Mohammed Kindle, RN   Procedure:  Laparoscopic cholecystectomy with intra-operative cholangiography  Anesthesia:  General  Estimated Blood Loss:  minimal  Drains: none         Specimen: Gallbladder to pathology  Indications:  Patient is referred by Dr. Laural Golden and Deberah Castle, NP, for surgical evaluation and management of symptomatic cholelithiasis and chronic cholecystitis. Patient's primary care physician is Dr. Redmond School. Patient has a history of intermittent right upper quadrant abdominal pain. This occurs every few weeks. It'll last for approximately 1-1-1/2 days. Patient denies any nausea or vomiting. She denies fevers or chills. She denies jaundice or acholic stools. Patient has had no prior hepatobiliary or pancreatic disease. Patient has had prior abdominal surgery including abdominal hysterectomy, small bowel resection, and lysis of adhesions. Patient presents today accompanied by her family to discuss cholecystectomy. She was previously evaluated for gallbladder surgery by the general surgery practice in [place]. However, based on age and comorbidities, the patient is referred to [facility] for care.  Procedure Details:  The patient was seen in the pre-op holding area. The risks, benefits, complications, treatment options, and expected outcomes were previously discussed with the patient. The patient agreed with the proposed plan and has signed the informed consent form.  The patient was brought to the Operating Room, identified as Jane Andrews and the procedure verified as laparoscopic cholecystectomy with intraoperative cholangiography. A "time out" was completed and the above information confirmed.  The patient was placed in the supine position. Following induction of general anesthesia, the abdomen was  prepped and draped in the usual aseptic fashion.  An incision was made in the skin near the umbilicus. The midline fascia was incised and the peritoneal cavity was entered and a Hasson canula was introduced under direct vision.  The Hasson canula was secured with a 0-Vicryl pursestring suture. Pneumoperitoneum was established with carbon dioxide. Additional trocars were introduced under direct vision along the right costal margin in the midline, mid-clavicular line, and anterior axillary line.   The gallbladder was identified and the fundus grasped and retracted cephalad. Adhesions were taken down bluntly and the electrocautery was utilized as needed, taking care not to injure any adjacent structures. The infundibulum was grasped and retracted laterally, exposing the peritoneum overlying the triangle of Calot. The peritoneum was incised and structures exposed with blunt dissection. The cystic duct was clearly identified, bluntly dissected circumferentially, and clipped at the neck of the gallbladder.  An incision was made in the cystic duct and the cholangiogram catheter introduced. The catheter was secured using an ligaclip.  Real-time cholangiography was performed using C-arm fluoroscopy.  There was rapid filling of a normal caliber common bile duct.  There was reflux of contrast into the left and right hepatic ductal systems.  There was free flow distally into the duodenum without filling defect or obstruction.  The catheter was removed from the peritoneal cavity.  The cystic duct was then ligated with surgical clips and divided. The cystic artery was identified, dissected circumferentially, ligated with ligaclips, and divided.  The gallbladder was dissected away from the liver bed using the electrocautery for hemostasis. The gallbladder was completely removed from the liver and placed into an endocatch bag. The gallbladder was removed in the endocatch bag through the umbilical port site and submitted  to pathology for review.  The right upper quadrant was irrigated and the  gallbladder bed was inspected. Hemostasis was achieved with the electrocautery.  Pneumoperitoneum was released after viewing removal of the trocars with good hemostasis noted. The umbilical wound was irrigated and the fascia was then closed with the pursestring suture.  Local anesthetic was infiltrated at all port sites. The skin incisions were closed with 4-0 Monocril subcuticular sutures and steri-strips and dressings were applied.  Instrument, sponge, and needle counts were correct at the conclusion of the case.  The patient was awakened from anesthesia and brought to the recovery room in stable condition.  The patient tolerated the procedure well.   Armandina Gemma, MD [facility] Office: [phone]

## 2018-06-25 NOTE — Anesthesia Procedure Notes (Signed)
Procedure Name: Intubation Date/Time: 06/25/2018 12:29 PM Performed by: Kyung Rudd, CRNA Pre-anesthesia Checklist: Patient identified, Emergency Drugs available, Suction available and Patient being monitored Patient Re-evaluated:Patient Re-evaluated prior to induction Oxygen Delivery Method: Circle system utilized Preoxygenation: Pre-oxygenation with 100% oxygen Induction Type: IV induction Ventilation: Mask ventilation without difficulty Laryngoscope Size: Mac and 3 Grade View: Grade I Tube type: Oral Tube size: 7.0 mm Number of attempts: 1 Airway Equipment and Method: Stylet Placement Confirmation: ETT inserted through vocal cords under direct vision,  positive ETCO2 and breath sounds checked- equal and bilateral Secured at: 21 cm Tube secured with: Tape Dental Injury: Teeth and Oropharynx as per pre-operative assessment

## 2018-06-25 NOTE — Interval H&P Note (Signed)
History and Physical Interval Note:  06/25/2018 12:05 PM  Jane Andrews  has presented today for surgery, with the diagnosis of CHRONIC CHOLCYSTITITS.  The various methods of treatment have been discussed with the patient and family. After consideration of risks, benefits and other options for treatment, the patient has consented to    Procedure(s): LAPAROSCOPIC CHOLECYSTECTOMY WITH INTRAOPERATIVE CHOLANGIOGRAM POSSIBLE OPEN (N/A) as a surgical intervention .    The patient's history has been reviewed, patient examined, no change in status, stable for surgery.  I have reviewed the patient's chart and labs.  Questions were answered to the patient's satisfaction.    Armandina Gemma, [place] [facility] Office: [place]

## 2018-06-25 NOTE — Anesthesia Preprocedure Evaluation (Addendum)
Anesthesia Evaluation  Patient identified by MRN, date of birth, ID band Patient awake    Reviewed: Allergy & Precautions, NPO status , Patient's Chart, lab work & pertinent test results  Airway Mallampati: I       Dental  (+) Teeth Intact,    Pulmonary former smoker,    Pulmonary exam normal breath sounds clear to auscultation       Cardiovascular hypertension, Normal cardiovascular exam Rhythm:Regular Rate:Normal     Neuro/Psych PSYCHIATRIC DISORDERS Anxiety    GI/Hepatic GERD  Medicated,  Endo/Other  Hypothyroidism   Renal/GU Renal diseaseCKD     Musculoskeletal   Abdominal Normal abdominal exam  (+)   Peds  Hematology  (+) anemia ,   Anesthesia Other Findings   Reproductive/Obstetrics                           Anesthesia Physical Anesthesia Plan  ASA: II  Anesthesia Plan: General   Post-op Pain Management:    Induction: Intravenous  PONV Risk Score and Plan: 4 or greater and Ondansetron  Airway Management Planned: Oral ETT  Additional Equipment:   Intra-op Plan:   Post-operative Plan: Extubation in OR  Informed Consent: I have reviewed the patients History and Physical, chart, labs and discussed the procedure including the risks, benefits and alternatives for the proposed anesthesia with the patient or authorized representative who has indicated his/her understanding and acceptance.   Dental advisory given  Plan Discussed with: CRNA and Surgeon  Anesthesia Plan Comments:         Anesthesia Quick Evaluation

## 2018-06-25 NOTE — Transfer of Care (Signed)
Immediate Anesthesia Transfer of Care Note  Patient: Jane Andrews  Procedure(s) Performed: LAPAROSCOPIC CHOLECYSTECTOMY WITH INTRAOPERATIVE CHOLANGIOGRAM (N/A Abdomen)  Patient Location: PACU  Anesthesia Type:General  Level of Consciousness: awake, alert  and oriented  Airway & Oxygen Therapy: Patient Spontanous Breathing and Patient connected to face mask oxygen  Post-op Assessment: Report given to RN, Post -op Vital signs reviewed and stable and Patient moving all extremities X 4  Post vital signs: Reviewed and stable  Last Vitals:  Vitals Value Taken Time  BP 153/69 06/25/2018  1:48 PM  Temp    Pulse 94 06/25/2018  1:51 PM  Resp 12 06/25/2018  1:51 PM  SpO2 92 % 06/25/2018  1:51 PM  Vitals shown include unvalidated device data.  Last Pain:  Vitals:   06/25/18 0945  TempSrc: Oral      Patients Stated Pain Goal: 3 (20/03/79 4446)  Complications: No apparent anesthesia complications

## 2018-06-26 ENCOUNTER — Encounter (HOSPITAL_COMMUNITY): Payer: Self-pay | Admitting: Surgery

## 2018-06-26 DIAGNOSIS — Z9842 Cataract extraction status, left eye: Secondary | ICD-10-CM | POA: Diagnosis not present

## 2018-06-26 DIAGNOSIS — K8010 Calculus of gallbladder with chronic cholecystitis without obstruction: Secondary | ICD-10-CM | POA: Diagnosis not present

## 2018-06-26 DIAGNOSIS — K828 Other specified diseases of gallbladder: Secondary | ICD-10-CM | POA: Diagnosis not present

## 2018-06-26 DIAGNOSIS — D135 Benign neoplasm of extrahepatic bile ducts: Secondary | ICD-10-CM | POA: Diagnosis not present

## 2018-06-26 DIAGNOSIS — Z90710 Acquired absence of both cervix and uterus: Secondary | ICD-10-CM | POA: Diagnosis not present

## 2018-06-26 DIAGNOSIS — Z9841 Cataract extraction status, right eye: Secondary | ICD-10-CM | POA: Diagnosis not present

## 2018-06-26 MED ORDER — SIMETHICONE 80 MG PO CHEW: 80.0000 mg | CHEWABLE_TABLET | Freq: Four times a day (QID) | ORAL | Status: DC | PRN

## 2018-06-26 MED ORDER — SODIUM CHLORIDE 0.9% FLUSH: 3.0000 mL | Freq: Two times a day (BID) | INTRAVENOUS | Status: DC

## 2018-06-26 MED ORDER — SODIUM CHLORIDE 0.9% FLUSH: 3.0000 mL | INTRAVENOUS | Status: DC | PRN

## 2018-06-26 MED ORDER — SODIUM CHLORIDE 0.9 % IV SOLN: 250.0000 mL | INTRAVENOUS | Status: DC | PRN

## 2018-06-26 NOTE — Discharge Summary (Signed)
Physician Discharge Summary    Patient ID: Jane Andrews MRN: [id number] DOB/AGE: 82/13/1926  84 y.o.  Admit date: 06/25/2018 Discharge date: 06/26/2018   Hospital Stay = 0 days  Patient Care Team: Redmond School, MD as PCP - General (Internal Medicine) Danie Binder, MD as Consulting Physician (Gastroenterology) Lendon Colonel, NP as Nurse Practitioner (Nurse Practitioner) Yehuda Savannah, MD as Attending Physician (Cardiology)  Discharge Diagnoses:  Principal Problem:   Chronic cholecystitis s/p lap cholecystectomy 06/25/2018   1 Day Post-Op  06/25/2018  POST-OPERATIVE DIAGNOSIS:   CHRONIC CHOLCYSTITITS  SURGERY:  06/25/2018  Procedure(s): LAPAROSCOPIC CHOLECYSTECTOMY WITH INTRAOPERATIVE CHOLANGIOGRAM  SURGEON:    Surgeon(s): Armandina Gemma, MD  Consults: None  Hospital Course:   The patient underwent the surgery above.  Postoperatively, the patient gradually mobilized and advanced to a solid diet.  Pain and other symptoms were treated aggressively.    By the time of discharge, the patient was walking well the hallways, eating food, having flatus.  Pain was well-controlled on an oral medications.  Based on meeting discharge criteria and continuing to recover, I felt it was safe for the patient to be discharged from the hospital to further recover with close followup. Postoperative recommendations were discussed in detail.  They are written as well.  Discharged Condition: good  Disposition:  Follow-up Information    Armandina Gemma, MD. Schedule an appointment as soon as possible for a visit in 3 weeks.   Specialty:  General Surgery Why:  For wound re-check Contact information: [address] [place] [address] [phone]           Discharge disposition: 01-Home or Self Care       Discharge Instructions    Call MD for:   Complete by:  As directed    FEVER > 101.5 F  (temperatures < 101.5 F are not significant)   Call MD for:  extreme fatigue   Complete by:  As directed    Call MD for:  persistant dizziness or light-headedness   Complete by:  As directed    Call MD for:  persistant nausea and vomiting   Complete by:  As directed    Call MD for:  redness, tenderness, or signs of infection (pain, swelling, redness, odor or green/yellow discharge around incision site)   Complete by:  As directed    Call MD for:  severe uncontrolled pain   Complete by:  As directed    Diet - low sodium heart healthy   Complete by:  As directed    Start with a bland diet such as soups, liquids, starchy foods, low fat foods, etc. the first few days at home. Gradually advance to a solid, low-fat, high fiber diet by the end of the first week at home.   Add a fiber supplement to your diet (Metamucil, etc) If you feel full, bloated, or constipated, stay on a full liquid or pureed/blenderized diet for a few days until you feel better and are no longer constipated.   Discharge instructions   Complete by:  As directed    See Discharge Instructions If you are not getting better after two weeks or are noticing you are getting worse, contact our office [phone] for further advice.  We may need to adjust your medications, re-evaluate you in the office, send you to the emergency room, or see what other things we can do to help. The clinic staff is available to answer your questions during regular  business hours (8:30am-5pm).  Please don't hesitate to call and ask to speak to one of our nurses for clinical concerns.    A surgeon from [facility] is always on call at the hospitals 24 hours/day If you have a medical emergency, go to the nearest emergency room or call 911.   Discharge wound care:   Complete by:  As directed    It is good for closed incisions and even open wounds to be washed every day.  Shower every day.  Short baths are fine.  Wash the incisions and wounds clean with soap & water.     You may leave  closed incisions open to air if it is dry.   You may cover the incision with clean gauze & replace it after your daily shower for comfort.   You have skin tapes (Steristrips) on your incision.  Leave them in place.  They will fall off on their own like a scab.  You may trim any edges that curl up with clean scissors.   Driving Restrictions   Complete by:  As directed    You may drive when: - you are no longer taking narcotic prescription pain medication - you can comfortably wear a seatbelt - you can safely make sudden turns/stops without pain.   Increase activity slowly   Complete by:  As directed    Start light daily activities --- self-care, walking, climbing stairs- beginning the day after surgery.  Gradually increase activities as tolerated.  Control your pain to be active.  Stop when you are tired.  Ideally, walk several times a day, eventually an hour a day.   Most people are back to most day-to-day activities in a few weeks.  It takes 4-6 weeks to get back to unrestricted, intense activity. If you can walk 30 minutes without difficulty, it is safe to try more intense activity such as jogging, treadmill, bicycling, low-impact aerobics, swimming, etc. Save the most intensive and strenuous activity for last (Usually 4-8 weeks after surgery) such as sit-ups, heavy lifting, contact sports, etc.  Refrain from any intense heavy lifting or straining until you are off narcotics for pain control.  You will have off days, but things should improve week-by-week. DO NOT PUSH THROUGH PAIN.  Let pain be your guide: If it hurts to do something, don't do it.   Lifting restrictions   Complete by:  As directed    If you can walk 30 minutes without difficulty, it is safe to try more intense activity such as jogging, treadmill, bicycling, low-impact aerobics, swimming, etc. Save the most intensive and strenuous activity for last (Usually 4-8 weeks after surgery) such as sit-ups, heavy lifting, contact sports,  etc.   Refrain from any intense heavy lifting or straining until you are off narcotics for pain control.  You will have off days, but things should improve week-by-week. DO NOT PUSH THROUGH PAIN.  Let pain be your guide: If it hurts to do something, don't do it.  Pain is your body warning you to avoid that activity for another week until the pain goes down.   May shower / Bathe   Complete by:  As directed    May walk up steps   Complete by:  As directed    Sexual Activity Restrictions   Complete by:  As directed    You may have sexual intercourse when it is comfortable. If it hurts to do something, stop.      Allergies as of  06/26/2018      Reactions   Codeine Nausea And Vomiting      Medication List    TAKE these medications   ALPRAZolam 1 MG tablet Commonly known as:  XANAX Take 1 mg by mouth at bedtime as needed for sleep.   CALCIUM+D3 600-800 MG-UNIT Tabs Generic drug:  Calcium Carb-Cholecalciferol Take 1 tablet by mouth daily.   diclofenac 75 MG EC tablet Commonly known as:  VOLTAREN Take 1 tablet (75 mg total) by mouth daily with breakfast.   DULoxetine 60 MG capsule Commonly known as:  CYMBALTA Take 60 mg by mouth every morning.   Fish Oil 1000 MG Caps Take 1,000 mg by mouth every morning.   levothyroxine 75 MCG tablet Commonly known as:  SYNTHROID, LEVOTHROID Take 75 mcg by mouth daily before breakfast.   multivitamin tablet Take 1 tablet by mouth every morning.   omeprazole 40 MG capsule Commonly known as:  PRILOSEC TAKE ONE CAPSULE BY MOUTH DAILY   Simethicone 180 MG Caps Take 1 capsule (180 mg total) by mouth 3 (three) times daily as needed. What changed:  reasons to take this   traMADol 50 MG tablet Commonly known as:  ULTRAM Take 1-2 tablets (50-100 mg total) by mouth every 6 (six) hours as needed for moderate pain.   VITAMIN B-12 IJ Inject as directed every 30 (thirty) days.            Discharge Care Instructions  (From admission,  onward)        Start     Ordered   06/26/18 0000  Discharge wound care:    Comments:  It is good for closed incisions and even open wounds to be washed every day.  Shower every day.  Short baths are fine.  Wash the incisions and wounds clean with soap & water.     You may leave closed incisions open to air if it is dry.   You may cover the incision with clean gauze & replace it after your daily shower for comfort.   You have skin tapes (Steristrips) on your incision.  Leave them in place.  They will fall off on their own like a scab.  You may trim any edges that curl up with clean scissors.   06/26/18 0813      Significant Diagnostic Studies:  Results for orders placed or performed during the hospital encounter of 06/25/18 (from the past 72 hour(s))  Type and screen [place]     Status: None   Collection Time: 06/25/18  9:47 AM  Result Value Ref Range   ABO/RH(D) O POS    Antibody Screen NEG    Sample Expiration      06/28/2018 Performed at [facility], [place] [address] [place], [place] [address]   ABO/Rh     Status: None   Collection Time: 06/25/18  9:47 AM  Result Value Ref Range   ABO/RH(D)      O POS Performed at [facility], [place] [address], [place], [place] [address]     Dg Cholangiogram Operative  Result Date: 06/25/2018 CLINICAL DATA:  Intraoperative cholangiogram during laparoscopic cholecystectomy. EXAM: INTRAOPERATIVE CHOLANGIOGRAM FLUOROSCOPY TIME:  16 seconds (3.4 mGy) COMPARISON:  Abdominal ultrasound - 03/01/2018; CT abdomen and pelvis - 04/12/2016 FINDINGS: Intraoperative cholangiographic images of the right upper abdominal quadrant during laparoscopic cholecystectomy are provided for review. Surgical clips overlie the expected location of the gallbladder fossa. Contrast injection demonstrates selective cannulation of the  central aspect of the cystic duct. There is passage of contrast through the central  aspect of the cystic duct with filling of a non dilated common bile duct. There is passage of contrast though the CBD and into the descending portion of the duodenum. There is minimal reflux of injected contrast into the common hepatic duct and central aspect of the non dilated intrahepatic biliary system. There are no discrete filling defects within the opacified portions of the biliary system to suggest the presence of choledocholithiasis. Incidentally noted staghorn right renal calculus as demonstrated on remote abdominal CT performed 04/12/2016. IMPRESSION: No evidence of choledocholithiasis. Electronically Signed   By: Sandi Mariscal M.D.   On: 06/25/2018 13:49    Discharge Exam: Blood pressure (!) 143/70, pulse 74, temperature 98.3 F (36.8 C), temperature source Oral, resp. rate 13, height 5\' 7"  (1.702 m), weight 54 kg (119 lb), SpO2 97 %.  General: Pt awake/alert/oriented x4 in No acute distress.  Nurse Nevin Bloodgood in room with me Eyes: PERRL, normal EOM.  Sclera clear.  No icterus Neuro: CN II-XII intact w/o focal sensory/motor deficits. Lymph: No head/neck/groin lymphadenopathy Psych:  No delerium/psychosis/paranoia HENT: Normocephalic, Mucus membranes moist.  No thrush Neck: Supple, No tracheal deviation Chest: No chest wall pain w good excursion CV:  Pulses intact.  Regular rhythm MS: Normal AROM mjr joints.  No obvious deformity Abdomen: Soft.  Nondistended.  Nontender.  Outer dressing was removed.  Steri-Strips intact on laparoscopic port sites.  No evidence of peritonitis.  No incarcerated hernias. Ext:  SCDs BLE.  No mjr edema.  No cyanosis Skin: No petechiae / purpura  Past Medical History:  Diagnosis Date  . Abdominal adhesions 1994  . Allergic rhinitis   . Anemia   . Anxiety and depression   . Arthritis   . Atrial fibrillation ([place]) 10/2012   Associated with severe anemia and esophageal pill impaction  . Breast carcinoma ([place])    right mastectomy "25+ years ago"  .  Cholelithiasis    asymptomatic  . Gastroesophageal reflux disease    Hiatal hernia  . Heart murmur    "nothing to be concerned about  . History of blood transfusion   . History of kidney stones    Xray   . Hypertension    not on medication  . Hypothyroid   . Low back pain   . 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 Nephrolithiasis 2004   painless hematuria  . Short gut syndrome    bowel resection , 2004  . Upper GI bleed 2004   Multiple episodes of melena-? due to gastritis or adverse drug effect (nonsteroidals, small bowel ulceration with Fosamax); caused by Pepto-Bismol during one Emergency Department evaluation    Past Surgical History:  Procedure Laterality Date  . ABDOMINAL HYSTERECTOMY     emergency s/p delivery  . ABDOMINAL HYSTERECTOMY  1960   massive gynecologic bleeding  . BOWEL RESECTION     Resulting short gut syndrome  . CHOLECYSTECTOMY N/A 06/25/2018   Procedure: LAPAROSCOPIC CHOLECYSTECTOMY WITH INTRAOPERATIVE CHOLANGIOGRAM;  Surgeon: Armandina Gemma, MD;  Location: [facility];  Service: General;  Laterality: N/A;  . COLONOSCOPY W/ POLYPECTOMY  2005   Lipoma; diverticulosis  . COLONOSCOPY WITH ESOPHAGOGASTRODUODENOSCOPY (EGD)  11/22/2012   [facility]  . LAPAROSCOPIC LYSIS OF ADHESIONS  1965   s/p adhesions  . MASTECTOMY  right breast  . MASTECTOMY     Carcinoma of the breast; right  . UPPER GASTROINTESTINAL  ENDOSCOPY      Social History   Socioeconomic History  . Marital status: Widowed    Spouse name: Not on file  . Number of children: Not on file  . Years of education: 82  . Highest education level: Not on file  Occupational History  . Not on file  Social Needs  . Financial resource strain: Not on file  . Food insecurity:    Worry: Not on file    Inability: Not on file  . Transportation needs:    Medical: Not on file    Non-medical: Not on file  Tobacco Use  . Smoking status: Former Smoker    Packs/day:  1.50    Years: 20.00    Pack years: 30.00    Types: Cigarettes  . Smokeless tobacco: Never Used  Substance and Sexual Activity  . Alcohol use: No  . Drug use: No  . Sexual activity: Never  Lifestyle  . Physical activity:    Days per week: Not on file    Minutes per session: Not on file  . Stress: Not on file  Relationships  . Social connections:    Talks on phone: Not on file    Gets together: Not on file    Attends religious service: Not on file    Active member of club or organization: Not on file    Attends meetings of clubs or organizations: Not on file    Relationship status: Not on file  . Intimate partner violence:    Fear of current or ex partner: Not on file    Emotionally abused: Not on file    Physically abused: Not on file    Forced sexual activity: Not on file  Other Topics Concern  . Not on file  Social History Narrative  . Not on file    Family History  Problem Relation Age of Onset  . Anuerysm Father   . Rheum arthritis Sister   . Healthy Sister   . COPD Sister   . Healthy Brother   . Cancer Unknown   . Colon cancer Neg Hx     Current Facility-Administered Medications  Medication Dose Route Frequency Provider Last Rate Last Dose  . 0.9 %  sodium chloride infusion  250 mL Intravenous PRN Michael Boston, MD      . acetaminophen (TYLENOL) tablet 650 mg  650 mg Oral Q6H PRN Armandina Gemma, MD       Or  . acetaminophen (TYLENOL) suppository 650 mg  650 mg Rectal Q6H PRN Armandina Gemma, MD      . DULoxetine (CYMBALTA) DR capsule 60 mg  60 mg Oral q morning - 10a Gerkin, Sherren Mocha, MD      . HYDROcodone-acetaminophen (NORCO/VICODIN) 5-325 MG per tablet 1-2 tablet  1-2 tablet Oral Q4H PRN Armandina Gemma, MD   1 tablet at 06/26/18 0017  . HYDROmorphone (DILAUDID) injection 0.5 mg  0.5 mg Intravenous Q2H PRN Armandina Gemma, MD   0.5 mg at 06/25/18 1651  . levothyroxine (SYNTHROID, LEVOTHROID) tablet 75 mcg  75 mcg Oral QAC breakfast Armandina Gemma, MD   75 mcg at 06/26/18  0809  . ondansetron (ZOFRAN-ODT) disintegrating tablet 4 mg  4 mg Oral Q6H PRN Armandina Gemma, MD       Or  . ondansetron (ZOFRAN) injection 4 mg  4 mg Intravenous Q6H PRN Armandina Gemma, MD   4 mg at 06/25/18 1651  . simethicone (MYLICON) chewable tablet 80 mg  80 mg Oral QID PRN Dain Laseter,  Remo Lipps, MD      . sodium chloride flush (NS) 0.9 % injection 3 mL  3 mL Intravenous Gorden Harms, MD      . sodium chloride flush (NS) 0.9 % injection 3 mL  3 mL Intravenous PRN Michael Boston, MD      . traMADol Veatrice Bourbon) tablet 50 mg  50 mg Oral Q6H PRN Armandina Gemma, MD         Allergies  Allergen Reactions  . Codeine Nausea And Vomiting    Signed: Morton Peters, MD, FACS, MASCRS Gastrointestinal and Minimally Invasive Surgery    [address], [place], [place] [address] [phone] Main / Paging [phone] Fax   06/26/2018, 8:14 AM

## 2018-06-26 NOTE — Discharge Instructions (Signed)
[facility]  LAPAROSCOPIC SURGERY:  POST-OP INSTRUCTIONS  Always review your discharge instruction sheet given to you by the facility where your surgery was performed.  A prescription for pain medication may be given to you upon discharge.  Take your pain medication as prescribed.  If narcotic pain medicine is not needed, then you may take acetaminophen (Tylenol) or ibuprofen (Advil) as needed.  Take your usually prescribed medications unless otherwise directed.  If you need a refill on your pain medication, please contact your pharmacy.  They will contact our office to request authorization. Prescriptions will not be filled after 5 P.M. or on weekends.  You should follow a light diet the first few days after arrival home, such as soup and crackers or toast.  Be sure to include plenty of fluids daily.  Most patients will experience some swelling and bruising in the area of the incisions.  Ice packs will help.  Swelling and bruising can take several days to resolve.   It is common to experience some constipation after surgery.  Increasing fluid intake and taking a stool softener (such as Colace) will usually help or prevent this problem from occurring.  A mild laxative (Milk of Magnesia or Miralax) should be taken according to package instructions if there has been no bowel movement after 48 hours.  You will have steri-strips and a gauze dressing over your incisions.  You may remove the gauze bandage on the second day after surgery, and you may shower at that time.  Leave your steri-strips (small skin tapes) in place directly over the incision.  These strips should remain on the skin for 5-7 days and then be removed.  You may get them wet in the shower and pat them dry.  Any sutures or staples will be removed at the office during your follow-up visit.  ACTIVITIES:  You may resume regular (light) daily activities beginning the next day - such as daily self-care, walking,  climbing stairs - gradually increasing activities as tolerated.  You may have sexual intercourse when it is comfortable.  Refrain from any heavy lifting or straining until approved by your doctor.  You may drive when you are no longer taking prescription pain medication, you can comfortably wear a seatbelt, and you can safely maneuver your car and apply brakes.  You should see your doctor in the office for a follow-up appointment approximately 2-3 weeks after your surgery.  Make sure that you call for this appointment within a day or two after you arrive home to insure a convenient appointment time.  WHEN TO CALL YOUR DOCTOR: 1. Fever over 101.0 2. Inability to urinate 3. Continued bleeding from incision 4. Increased pain, redness, or drainage from the incision 5. Increasing abdominal pain  The clinic staff is available to answer your questions during regular business hours.  Please dont hesitate to call and ask to speak to one of the nurses for clinical concerns.  If you have a medical emergency, go to the nearest emergency room or call 911.  A surgeon from [facility] is always on call for the hospital.  Earnstine Regal, MD, [facility] Office: [place] Free:  [place] [phone]  Website: [email or web address]    Cholecystitis Cholecystitis is swelling and irritation (inflammation) of the gallbladder. The gallbladder is an organ that is shaped like a pear. It is under the liver on the right side of the body. This condition is often caused by gallstones. You  doctor may do tests to see how your gallbladder works. These tests may include:  Imaging tests, such as: ? An ultrasound. ? MRI.  Tests that check how your liver works.  This condition needs treatment. Follow these instructions at home: Home care will depend on your treatment. In general:  Take over-the-counter and prescription medicines only as told by  your doctor.  If you were prescribed an antibiotic medicine, take it as told by your doctor. Do not stop taking the antibiotic even if you start to feel better.  Follow instructions from your doctor about what to eat or drink. When you are allowed to eat, avoid eating or drinking anything that causes your symptoms to start.  Keep all follow-up visits as told by your doctor. This is important.  Contact a doctor if:  You have pain and your medicine does not help.  You have a fever. Get help right away if:  Your pain moves to: ? Another part of your belly (abdomen). ? Your back.  Your symptoms do not go away.  You have new symptoms. This information is not intended to replace advice given to you by your health care provider. Make sure you discuss any questions you have with your health care provider. Document Released: 11/20/2011 Document Revised: 05/08/2016 Document Reviewed: 03/14/2015 Elsevier Interactive Patient Education  2018 [employer].

## 2018-06-26 NOTE — Progress Notes (Signed)
Assessment unchanged. Pt verbalized understanding of dc instructions through teach back regarding follow up care, when to call the doctor, as well as medications to resume. Script x 1 given per MD. Discharged via wc to front entrance to accompanied by NT. Brother and sister-in-law waiting at front to carry pt home.

## 2018-06-28 ENCOUNTER — Other Ambulatory Visit (INDEPENDENT_AMBULATORY_CARE_PROVIDER_SITE_OTHER): Payer: Self-pay | Admitting: *Deleted

## 2018-06-28 DIAGNOSIS — R1084 Generalized abdominal pain: Secondary | ICD-10-CM

## 2018-06-28 DIAGNOSIS — Z832 Family history of diseases of the blood and blood-forming organs and certain disorders involving the immune mechanism: Secondary | ICD-10-CM

## 2018-06-28 NOTE — Telephone Encounter (Signed)
Lab noted for 4 weeks, a letter will be sent as a reminder.

## 2018-07-12 ENCOUNTER — Other Ambulatory Visit (INDEPENDENT_AMBULATORY_CARE_PROVIDER_SITE_OTHER): Payer: Self-pay | Admitting: *Deleted

## 2018-07-12 ENCOUNTER — Encounter (INDEPENDENT_AMBULATORY_CARE_PROVIDER_SITE_OTHER): Payer: Self-pay | Admitting: *Deleted

## 2018-07-12 DIAGNOSIS — Z832 Family history of diseases of the blood and blood-forming organs and certain disorders involving the immune mechanism: Secondary | ICD-10-CM

## 2018-07-12 DIAGNOSIS — R1084 Generalized abdominal pain: Secondary | ICD-10-CM

## 2018-07-26 DIAGNOSIS — R1084 Generalized abdominal pain: Secondary | ICD-10-CM | POA: Diagnosis not present

## 2018-07-26 DIAGNOSIS — Z832 Family history of diseases of the blood and blood-forming organs and certain disorders involving the immune mechanism: Secondary | ICD-10-CM | POA: Diagnosis not present

## 2018-07-27 ENCOUNTER — Other Ambulatory Visit (INDEPENDENT_AMBULATORY_CARE_PROVIDER_SITE_OTHER): Payer: Self-pay | Admitting: *Deleted

## 2018-07-27 DIAGNOSIS — Z832 Family history of diseases of the blood and blood-forming organs and certain disorders involving the immune mechanism: Secondary | ICD-10-CM

## 2018-07-27 DIAGNOSIS — R109 Unspecified abdominal pain: Secondary | ICD-10-CM

## 2018-07-27 LAB — CBC
HCT: 28.8 % — ABNORMAL LOW (ref 35.0–45.0)
Hemoglobin: 9.4 g/dL — ABNORMAL LOW (ref 11.7–15.5)
MCH: 28.5 pg (ref 27.0–33.0)
MCHC: 32.6 g/dL (ref 32.0–36.0)
MCV: 87.3 fL (ref 80.0–100.0)
MPV: 10.9 fL (ref 7.5–12.5)
PLATELETS: 183 10*3/uL (ref 140–400)
RBC: 3.3 10*6/uL — ABNORMAL LOW (ref 3.80–5.10)
RDW: 13 % (ref 11.0–15.0)
WBC: 6.1 10*3/uL (ref 3.8–10.8)

## 2018-09-08 ENCOUNTER — Other Ambulatory Visit (INDEPENDENT_AMBULATORY_CARE_PROVIDER_SITE_OTHER): Payer: Self-pay | Admitting: *Deleted

## 2018-09-08 ENCOUNTER — Encounter (INDEPENDENT_AMBULATORY_CARE_PROVIDER_SITE_OTHER): Payer: Self-pay | Admitting: *Deleted

## 2018-09-08 DIAGNOSIS — R109 Unspecified abdominal pain: Secondary | ICD-10-CM

## 2018-09-08 DIAGNOSIS — Z832 Family history of diseases of the blood and blood-forming organs and certain disorders involving the immune mechanism: Secondary | ICD-10-CM

## 2018-09-09 DIAGNOSIS — Z0001 Encounter for general adult medical examination with abnormal findings: Secondary | ICD-10-CM | POA: Diagnosis not present

## 2018-09-09 DIAGNOSIS — I4891 Unspecified atrial fibrillation: Secondary | ICD-10-CM | POA: Diagnosis not present

## 2018-09-09 DIAGNOSIS — I10 Essential (primary) hypertension: Secondary | ICD-10-CM | POA: Diagnosis not present

## 2018-09-09 DIAGNOSIS — M47814 Spondylosis without myelopathy or radiculopathy, thoracic region: Secondary | ICD-10-CM | POA: Diagnosis not present

## 2018-09-09 DIAGNOSIS — R946 Abnormal results of thyroid function studies: Secondary | ICD-10-CM | POA: Diagnosis not present

## 2018-09-09 DIAGNOSIS — E063 Autoimmune thyroiditis: Secondary | ICD-10-CM | POA: Diagnosis not present

## 2018-09-09 DIAGNOSIS — D649 Anemia, unspecified: Secondary | ICD-10-CM | POA: Diagnosis not present

## 2018-09-09 DIAGNOSIS — Z1389 Encounter for screening for other disorder: Secondary | ICD-10-CM | POA: Diagnosis not present

## 2018-09-09 DIAGNOSIS — E785 Hyperlipidemia, unspecified: Secondary | ICD-10-CM | POA: Diagnosis not present

## 2018-09-09 DIAGNOSIS — K912 Postsurgical malabsorption, not elsewhere classified: Secondary | ICD-10-CM | POA: Diagnosis not present

## 2018-09-09 DIAGNOSIS — Z6820 Body mass index (BMI) 20.0-20.9, adult: Secondary | ICD-10-CM | POA: Diagnosis not present

## 2018-09-09 DIAGNOSIS — M546 Pain in thoracic spine: Secondary | ICD-10-CM | POA: Diagnosis not present

## 2018-09-15 ENCOUNTER — Other Ambulatory Visit (INDEPENDENT_AMBULATORY_CARE_PROVIDER_SITE_OTHER): Payer: Self-pay | Admitting: *Deleted

## 2018-09-15 DIAGNOSIS — N183 Chronic kidney disease, stage 3 unspecified: Secondary | ICD-10-CM

## 2018-09-16 ENCOUNTER — Other Ambulatory Visit (HOSPITAL_COMMUNITY): Payer: Self-pay | Admitting: Internal Medicine

## 2018-09-16 DIAGNOSIS — E2839 Other primary ovarian failure: Secondary | ICD-10-CM

## 2018-09-21 ENCOUNTER — Emergency Department (HOSPITAL_COMMUNITY): Payer: Medicare Other

## 2018-09-21 ENCOUNTER — Emergency Department (HOSPITAL_COMMUNITY)
Admission: EM | Admit: 2018-09-21 | Discharge: 2018-09-21 | Disposition: A | Discharge status: Home / Self Care | Payer: Medicare Other | Attending: Emergency Medicine | Admitting: Emergency Medicine

## 2018-09-21 ENCOUNTER — Other Ambulatory Visit: Payer: Self-pay

## 2018-09-21 ENCOUNTER — Encounter (HOSPITAL_COMMUNITY): Payer: Self-pay | Admitting: Emergency Medicine

## 2018-09-21 DIAGNOSIS — E039 Hypothyroidism, unspecified: Secondary | ICD-10-CM | POA: Diagnosis not present

## 2018-09-21 DIAGNOSIS — Z79899 Other long term (current) drug therapy: Secondary | ICD-10-CM | POA: Insufficient documentation

## 2018-09-21 DIAGNOSIS — Z87891 Personal history of nicotine dependence: Secondary | ICD-10-CM | POA: Diagnosis not present

## 2018-09-21 DIAGNOSIS — M1811 Unilateral primary osteoarthritis of first carpometacarpal joint, right hand: Secondary | ICD-10-CM | POA: Diagnosis not present

## 2018-09-21 DIAGNOSIS — M1812 Unilateral primary osteoarthritis of first carpometacarpal joint, left hand: Secondary | ICD-10-CM | POA: Diagnosis not present

## 2018-09-21 DIAGNOSIS — Z9011 Acquired absence of right breast and nipple: Secondary | ICD-10-CM | POA: Insufficient documentation

## 2018-09-21 DIAGNOSIS — N183 Chronic kidney disease, stage 3 (moderate): Secondary | ICD-10-CM | POA: Diagnosis not present

## 2018-09-21 DIAGNOSIS — R011 Cardiac murmur, unspecified: Secondary | ICD-10-CM | POA: Insufficient documentation

## 2018-09-21 DIAGNOSIS — M79644 Pain in right finger(s): Secondary | ICD-10-CM | POA: Diagnosis present

## 2018-09-21 DIAGNOSIS — I129 Hypertensive chronic kidney disease with stage 1 through stage 4 chronic kidney disease, or unspecified chronic kidney disease: Secondary | ICD-10-CM | POA: Diagnosis not present

## 2018-09-21 DIAGNOSIS — M79641 Pain in right hand: Secondary | ICD-10-CM | POA: Diagnosis not present

## 2018-09-21 MED ORDER — DEXAMETHASONE SODIUM PHOSPHATE 4 MG/ML IJ SOLN
8.0000 mg | Freq: Once | INTRAMUSCULAR | Status: AC
  Administered 2018-09-21: 8 mg via INTRAMUSCULAR
  Filled 2018-09-21: qty 2

## 2018-09-21 MED ORDER — DICLOFENAC SODIUM 1 % TD GEL: 2.0000 g | Freq: Four times a day (QID) | TRANSDERMAL | 0 refills | Status: DC

## 2018-09-21 NOTE — Discharge Instructions (Addendum)
You may wear the splint as needed for support to your thumb.  Apply warm heat on and off as needed.  You may continue your Ultram if needed for pain.  Call Dr. Ruthe Mannan office in a few days to arrange a follow-up appointment if not improving.

## 2018-09-21 NOTE — ED Provider Notes (Signed)
[facility] EMERGENCY DEPARTMENT Provider Note   CSN: [id number] Arrival date & time: 09/21/18  0102     History   Chief Complaint Chief Complaint  Patient presents with  . Hand Pain    HPI Jane Andrews is a 84 y.o. female.  HPI  Jane Andrews is a 84 y.o. female who presents to the Emergency Department complaining of pain to her right thumb.  She states that she woke up with throbbing pain to the proximal thumb.  She describes the pain as constant, but worsens with movement.  She took one ultram tablet prior to arrival without relief.  She denies known injury, numbness, redness, and pain to her wrist.     Past Medical History:  Diagnosis Date  . Abdominal adhesions 1994  . Allergic rhinitis   . Anemia   . Anxiety and depression   . Arthritis   . Atrial fibrillation ([place]) 10/2012   Associated with severe anemia and esophageal pill impaction  . Breast carcinoma ([place])    right mastectomy "25+ years ago"  . Cholelithiasis    asymptomatic  . Gastroesophageal reflux disease    Hiatal hernia  . Heart murmur    "nothing to be concerned about  . History of blood transfusion   . History of kidney stones    Xray   . Hypertension    not on medication  . Hypothyroid   . Low back pain   . 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 Nephrolithiasis 2004   painless hematuria  . Short gut syndrome    bowel resection , 2004  . Upper GI bleed 2004   Multiple episodes of melena-? due to gastritis or adverse drug effect (nonsteroidals, small bowel ulceration with Fosamax); caused by Pepto-Bismol during one Emergency Department evaluation    Patient Active Problem List   Diagnosis Date Noted  . Chronic cholecystitis s/p lap cholecystectomy 06/25/2018 06/18/2018  . Obstipation 04/12/2016  . Vagal reaction 04/12/2016  . Abdominal pain 09/19/2014  . 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 Ecchymoses, spontaneous 05/31/2013   . Hypothyroid   . Atrial fibrillation ([place])   . Breast carcinoma ([place])   . Malabsorption   . CKD (chronic kidney disease) stage 3, GFR 30-59 ml/min (HCC) 10/21/2012  . Anemia, normocytic normochromic 07/06/2012  . Chronic diarrhea 02/10/2012  . Hypertension 02/10/2012    Past Surgical History:  Procedure Laterality Date  . ABDOMINAL HYSTERECTOMY     emergency s/p delivery  . ABDOMINAL HYSTERECTOMY  1960   massive gynecologic bleeding  . BOWEL RESECTION     Resulting short gut syndrome  . CHOLECYSTECTOMY N/A 06/25/2018   Procedure: LAPAROSCOPIC CHOLECYSTECTOMY WITH INTRAOPERATIVE CHOLANGIOGRAM;  Surgeon: Armandina Gemma, MD;  Location: [facility];  Service: General;  Laterality: N/A;  . COLONOSCOPY W/ POLYPECTOMY  2005   Lipoma; diverticulosis  . COLONOSCOPY WITH ESOPHAGOGASTRODUODENOSCOPY (EGD)  11/22/2012   Rehman  . LAPAROSCOPIC LYSIS OF ADHESIONS  1965   s/p adhesions  . MASTECTOMY  right breast  . MASTECTOMY     Carcinoma of the breast; right  . UPPER GASTROINTESTINAL ENDOSCOPY       OB History   None      Home Medications    Prior to Admission medications   Medication Sig Start Date End Date Taking? Authorizing Provider  ALPRAZolam Duanne Moron) 1 MG tablet Take 1 mg by mouth at bedtime as needed for sleep.    [provider]  Calcium Carb-Cholecalciferol (CALCIUM+D3) 600-800 MG-UNIT TABS Take 1 tablet by mouth daily.    [provider]  Cyanocobalamin (VITAMIN B-12 IJ) Inject as directed every 30 (thirty) days.    [provider]  diclofenac (VOLTAREN) 75 MG EC tablet Take 1 tablet (75 mg total) by mouth daily with breakfast. 04/13/18   Rehman, Mechele Dawley, MD  DULoxetine (CYMBALTA) 60 MG capsule Take 60 mg by mouth every morning.     [provider]  levothyroxine (SYNTHROID, LEVOTHROID) 75 MCG tablet Take 75 mcg by mouth daily before breakfast.    [provider]  Multiple Vitamin (MULTIVITAMIN) tablet Take 1 tablet by mouth every  morning.     [provider]  Omega-3 Fatty Acids (FISH OIL) 1000 MG CAPS Take 1,000 mg by mouth every morning.     [provider]  omeprazole (PRILOSEC) 40 MG capsule TAKE ONE CAPSULE BY MOUTH DAILY 11/25/17   Setzer, Rona Ravens, NP  Simethicone 180 MG CAPS Take 1 capsule (180 mg total) by mouth 3 (three) times daily as needed. Patient taking differently: Take 180 mg by mouth 3 (three) times daily as needed (gas).  04/13/18   Rehman, Mechele Dawley, MD  traMADol (ULTRAM) 50 MG tablet Take 1-2 tablets (50-100 mg total) by mouth every 6 (six) hours as needed for moderate pain. 06/25/18   Armandina Gemma, MD    Family History Family History  Problem Relation Age of Onset  . Anuerysm Father   . Rheum arthritis Sister   . Healthy Sister   . COPD Sister   . Healthy Brother   . Cancer Unknown   . Colon cancer Neg Hx     Social History Social History   Tobacco Use  . Smoking status: Former Smoker    Packs/day: 1.50    Years: 20.00    Pack years: 30.00    Types: Cigarettes  . Smokeless tobacco: Never Used  Substance Use Topics  . Alcohol use: No  . Drug use: No     Allergies   Codeine   Review of Systems Review of Systems  Constitutional: Negative for chills and fever.  Musculoskeletal: Positive for arthralgias (right thumb pain). Negative for joint swelling and neck pain.  Skin: Negative for color change and wound.  Neurological: Negative for weakness and numbness.     Physical Exam Updated Vital Signs BP (!) 146/91 (BP Location: Left Arm)   Pulse 87   Temp 98 F (36.7 C) (Oral)   Resp 18   Ht 5\' 1"  (1.549 m)   Wt 51.7 kg   SpO2 98%   BMI 21.54 kg/m   Physical Exam  Constitutional: She appears well-developed and well-nourished. No distress.  HENT:  Head: Atraumatic.  Mouth/Throat: Oropharynx is clear and moist.  Neck: Normal range of motion.  Cardiovascular: Normal rate and intact distal pulses.  Murmur heard. Pulmonary/Chest: Effort normal and  breath sounds normal.  Musculoskeletal: She exhibits tenderness. She exhibits no edema.       Right hand: She exhibits decreased range of motion, tenderness and bony tenderness. She exhibits normal two-point discrimination, normal capillary refill and no swelling. Normal sensation noted. Normal strength noted.       Hands: Focal ttp of first [place] joint of the right thumb.  Arthritic changes noted of the DIP joints of multiple fingers of the right hand.  No excessive warmth or edema.  Right wrist is non-tender  Neurological: She is alert. No sensory deficit.  Skin: Skin  is warm. Capillary refill takes less than 2 seconds. No rash noted. No erythema.  Psychiatric: She has a normal mood and affect.  Nursing note and vitals reviewed.    ED Treatments / Results  Labs (all labs ordered are listed, but only abnormal results are displayed) Labs Reviewed - No data to display  EKG None  Radiology Dg Hand Complete Right  Result Date: 09/21/2018 CLINICAL DATA:  Hand pain, no known injury, initial encounter EXAM: RIGHT HAND - COMPLETE 3+ VIEW COMPARISON:  None. FINDINGS: Significant degenerative changes are noted the first [facility] joint with remodeling of the base of the first metacarpal as well as the trapezium degenerative changes in the interphalangeal joints are seen with associated periarticular swelling. No acute fracture or dislocation is noted. No other focal abnormality is noted. IMPRESSION: Degenerative change primarily within the interphalangeal joints in the first [facility] joint. No acute fracture is seen. Electronically Signed   By: Inez Catalina M.D.   On: 09/21/2018 10:27    Procedures Procedures (including critical care time)  Medications Ordered in ED Medications  dexamethasone (DECADRON) injection 8 mg (has no administration in time range)     Initial Impression / Assessment and Plan / ED Course  I have reviewed the triage vital signs and the nursing notes.  Pertinent labs & imaging  results that were available during my care of the patient were reviewed by me and considered in my medical decision making (see chart for details).     Pt also seen by Dr. Lacinda Axon and care plan discussed.    XR shows degenerative changes of the first [facility] joint.  Pain is felt to be secondary to OA.  Neurovascularly intact.  No concerning symptoms for septic joint.  Velcro thumb spica applied for comfort and IM Decadron administered.  Patient agrees to symptomatic treatment and follow-up with local orthopedics if not improving  Final Clinical Impressions(s) / ED Diagnoses   Final diagnoses:  Arthritis of carpometacarpal [facility]) joint of right thumb    ED Discharge Orders    None       Bufford Lope 09/21/18 2007    Nat Christen, MD 09/24/18 [phone]

## 2018-09-21 NOTE — ED Triage Notes (Signed)
Pt c/o of right hand since last night.  Pt states waking up with hand pain.  Denies any new injuries.  nad

## 2018-09-23 DIAGNOSIS — N183 Chronic kidney disease, stage 3 (moderate): Secondary | ICD-10-CM | POA: Diagnosis not present

## 2018-09-23 LAB — BASIC METABOLIC PANEL
BUN/Creatinine Ratio: 28 (calc) — ABNORMAL HIGH (ref 6–22)
BUN: 37 mg/dL — ABNORMAL HIGH (ref 7–25)
CALCIUM: 9 mg/dL (ref 8.6–10.4)
CHLORIDE: 110 mmol/L (ref 98–110)
CO2: 20 mmol/L (ref 20–32)
Creat: 1.31 mg/dL — ABNORMAL HIGH (ref 0.60–0.88)
Glucose, Bld: 93 mg/dL (ref 65–99)
POTASSIUM: 4.3 mmol/L (ref 3.5–5.3)
SODIUM: 139 mmol/L (ref 135–146)

## 2018-10-11 ENCOUNTER — Ambulatory Visit (HOSPITAL_COMMUNITY)
Admission: RE | Admit: 2018-10-11 | Discharge: 2018-10-11 | Disposition: A | Discharge status: Home / Self Care | Payer: Medicare Other | Source: Ambulatory Visit | Attending: Internal Medicine | Admitting: Internal Medicine

## 2018-10-11 DIAGNOSIS — M810 Age-related osteoporosis without current pathological fracture: Secondary | ICD-10-CM | POA: Insufficient documentation

## 2018-10-11 DIAGNOSIS — E2839 Other primary ovarian failure: Secondary | ICD-10-CM | POA: Insufficient documentation

## 2018-10-11 DIAGNOSIS — M85851 Other specified disorders of bone density and structure, right thigh: Secondary | ICD-10-CM | POA: Diagnosis not present

## 2018-10-11 DIAGNOSIS — Z780 Asymptomatic menopausal state: Secondary | ICD-10-CM | POA: Diagnosis not present

## 2018-10-12 ENCOUNTER — Ambulatory Visit (INDEPENDENT_AMBULATORY_CARE_PROVIDER_SITE_OTHER): Payer: Medicare Other | Admitting: Internal Medicine

## 2018-10-12 ENCOUNTER — Encounter (INDEPENDENT_AMBULATORY_CARE_PROVIDER_SITE_OTHER): Payer: Self-pay | Admitting: Internal Medicine

## 2018-10-12 VITALS — BP 120/80 | HR 72 | Temp 98.0°F | Resp 18 | Ht 63.0 in | Wt 117.3 lb

## 2018-10-12 DIAGNOSIS — D508 Other iron deficiency anemias: Secondary | ICD-10-CM

## 2018-10-12 DIAGNOSIS — K219 Gastro-esophageal reflux disease without esophagitis: Secondary | ICD-10-CM

## 2018-10-12 MED ORDER — OMEPRAZOLE 20 MG PO CPDR: 20.0000 mg | DELAYED_RELEASE_CAPSULE | Freq: Every day | ORAL | 3 refills | Status: DC

## 2018-10-12 NOTE — Patient Instructions (Signed)
Physician will call with results of blood test when completed. Notify if lower dose of omeprazole not effective in controlling heartburn.

## 2018-10-12 NOTE — Progress Notes (Signed)
Presenting complaint;  Follow-up for GERD and anemia. History of right upper quadrant abdominal pain.  Database and subjective:  Patient is a 84 year old Caucasian female who was last seen in June 2019 for intermittent right upper quadrant pain.  She had been previously evaluated and felt to have symptomatic cholelithiasis.  She was seen by Dr. Blake Divine who recommended observation.  Because of persistent symptoms she was referred to Dr. Armandina Gemma of [facility] surgery and underwent laparoscopic cholecystectomy on 06/25/2018 and was uneventful. Patient states she has not had any more episodes of right upper quadrant abdominal pain.  She feels well.  She says she has rare heartburn with certain medications.  She feels she has good appetite.  She remains with chronic diarrhea but she has no more than 1 or 2 stools per day.  She has history of short gut syndrome and small bowel bacterial overgrowth.  She reports occasional accident.  She has not taken simethicone in several weeks.  She denies melena or rectal bleeding.  She also denies hematuria or vaginal bleeding.  Patient states she fell 10 days ago on her tailbone likely did not sustain any serious injury.   Current Medications: Outpatient Encounter Medications as of 10/12/2018  Medication Sig  . ALPRAZolam (XANAX) 1 MG tablet Take 1 mg by mouth at bedtime as needed for sleep.  . Biotin 10000 MCG TABS Take 6,000 mcg by mouth daily.  . Calcium Carb-Cholecalciferol (CALCIUM+D3) 600-800 MG-UNIT TABS Take 1 tablet by mouth daily.  . cholestyramine light (PREVALITE) 4 g packet Take 4 g by mouth 2 (two) times daily.  . Cyanocobalamin (VITAMIN B-12 IJ) Inject as directed every 30 (thirty) days.  . DULoxetine (CYMBALTA) 60 MG capsule Take 60 mg by mouth every morning.   Marland Kitchen levothyroxine (SYNTHROID, LEVOTHROID) 75 MCG tablet Take 75 mcg by mouth daily before breakfast.  . Multiple Vitamin (MULTIVITAMIN) tablet Take 1 tablet by mouth every  morning.   . Omega-3 Fatty Acids (FISH OIL) 1000 MG CAPS Take 1,000 mg by mouth every morning.   Marland Kitchen omeprazole (PRILOSEC) 40 MG capsule TAKE ONE CAPSULE BY MOUTH DAILY  . Simethicone 180 MG CAPS Take 1 capsule (180 mg total) by mouth 3 (three) times daily as needed. (Patient taking differently: Take 180 mg by mouth 3 (three) times daily as needed (gas). )  . traMADol (ULTRAM) 50 MG tablet Take 1-2 tablets (50-100 mg total) by mouth every 6 (six) hours as needed for moderate pain.  . [DISCONTINUED] diclofenac sodium (VOLTAREN) 1 % GEL Apply 2 g topically 4 (four) times daily. To the affected joint of the right thumb (Patient not taking: Reported on 10/12/2018)   No facility-administered encounter medications on file as of 10/12/2018.      Objective: Blood pressure 120/80, pulse 72, temperature 98 F (36.7 C), temperature source Oral, resp. rate 18, height 5\' 3"  (1.6 m), weight 117 lb 4.8 oz (53.2 kg). Patient is alert and in no acute distress. Conjunctiva is pink. Sclera is nonicteric Oropharyngeal mucosa is normal. No neck masses or thyromegaly noted. Cardiac exam with regular rhythm normal S1 and S2. No murmur or gallop noted. Lungs are clear to auscultation. Abdomen is symmetrical.  She has midline scar.  Bowel sounds are normal.  On palpation abdomen is soft and nontender with organomegaly or masses. No LE edema or clubbing noted.  Labs/studies Results: Lab data from  09/23/2018 Serum potassium 4.3.  H&H was 9.4 and 28.8 on 07/26/2018  Assessment:  #1.  RUQ  abdominal pain has resolved since cholecystectomy about 15 weeks ago.  #2.  Chronic GERD.  She is doing well symptomatically.  I feel the omeprazole dose could be dropped to 20 mg a day.  #3.  Anemia.  She has a history of iron deficiency anemia.  She was supposed to have H&H with her last blood work but was not done.  Plan:  List updated.  Simethicone deleted. Decrease omeprazole to 20 mg p.o. every morning. Patient  will go to the lab for CBC serum iron TIBC and ferritin. Office visit in 1 year.

## 2018-10-13 ENCOUNTER — Ambulatory Visit (INDEPENDENT_AMBULATORY_CARE_PROVIDER_SITE_OTHER): Payer: Medicare Other | Admitting: Internal Medicine

## 2018-10-14 DIAGNOSIS — D508 Other iron deficiency anemias: Secondary | ICD-10-CM | POA: Diagnosis not present

## 2018-10-15 DIAGNOSIS — Z6820 Body mass index (BMI) 20.0-20.9, adult: Secondary | ICD-10-CM | POA: Diagnosis not present

## 2018-10-15 DIAGNOSIS — M25511 Pain in right shoulder: Secondary | ICD-10-CM | POA: Diagnosis not present

## 2018-10-18 ENCOUNTER — Ambulatory Visit (INDEPENDENT_AMBULATORY_CARE_PROVIDER_SITE_OTHER): Payer: Medicare Other | Admitting: Orthopedic Surgery

## 2018-10-18 ENCOUNTER — Encounter: Payer: Self-pay | Admitting: Orthopedic Surgery

## 2018-10-18 ENCOUNTER — Ambulatory Visit (INDEPENDENT_AMBULATORY_CARE_PROVIDER_SITE_OTHER): Payer: Medicare Other

## 2018-10-18 VITALS — BP 138/82 | HR 78 | Ht 61.0 in | Wt 117.0 lb

## 2018-10-18 DIAGNOSIS — M549 Dorsalgia, unspecified: Secondary | ICD-10-CM

## 2018-10-18 DIAGNOSIS — M5440 Lumbago with sciatica, unspecified side: Secondary | ICD-10-CM

## 2018-10-18 LAB — CBC
HEMATOCRIT: 29.2 % — AB (ref 35.0–45.0)
HEMOGLOBIN: 9.6 g/dL — AB (ref 11.7–15.5)
MCH: 28 pg (ref 27.0–33.0)
MCHC: 32.9 g/dL (ref 32.0–36.0)
MCV: 85.1 fL (ref 80.0–100.0)
MPV: 10.5 fL (ref 7.5–12.5)
Platelets: 258 10*3/uL (ref 140–400)
RBC: 3.43 10*6/uL — ABNORMAL LOW (ref 3.80–5.10)
RDW: 13.8 % (ref 11.0–15.0)
WBC: 9.4 10*3/uL (ref 3.8–10.8)

## 2018-10-18 LAB — IRON, TOTAL/TOTAL IRON BINDING CAP
%SAT: 5 % (calc) — ABNORMAL LOW (ref 16–45)
Iron: 18 ug/dL — ABNORMAL LOW (ref 45–160)
TIBC: 394 mcg/dL (calc) (ref 250–450)

## 2018-10-18 LAB — FERRITIN: FERRITIN: 94 ng/mL (ref 16–288)

## 2018-10-18 LAB — TEST AUTHORIZATION

## 2018-10-18 MED ORDER — PREDNISOLONE 5 MG (48) PO TBPK: 5.0000 mg | ORAL_TABLET | ORAL | 1 refills | Status: AC

## 2018-10-18 NOTE — Patient Instructions (Signed)
HEAT 20 -30 MIN 3 -4 X  A DAY   PREDNISONE AS PRESCRIBED   TRAMADOL  AS NEEDED

## 2018-10-18 NOTE — Progress Notes (Signed)
NEW PROBLEM  OFFICE VISIT  Chief Complaint  Patient presents with  . Fall    Pt fell backwards off second stair landing on her buttocks/back. Also states she hit her head. No LOC. DOI 10/01/18    84 year old female presents for evaluation of lower back pain.  She has a diagnosis of degenerative scoliosis.  She fell on October 18 had acute onset of increasing back pain above her normal lower back discomfort.  She complains of a dull ache nonradiating moderate in severity associated with difficulty walking   Review of Systems  Constitutional: Negative for chills, fever, malaise/fatigue and weight loss.  Gastrointestinal: Negative for constipation.       Denies loss bowel control   Genitourinary:       Denies urinary retention or los of bladder control      Past Medical History:  Diagnosis Date  . Abdominal adhesions 1994  . Allergic rhinitis   . Anemia   . Anxiety and depression   . Arthritis   . Atrial fibrillation ([place]) 10/2012   Associated with severe anemia and esophageal pill impaction  . Breast carcinoma ([place])    right mastectomy "25+ years ago"  . Cholelithiasis    asymptomatic  . Gastroesophageal reflux disease    Hiatal hernia  . Heart murmur    "nothing to be concerned about  . History of blood transfusion   . History of kidney stones    Xray   . Hypertension    not on medication  . Hypothyroid   . Low back pain   . 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 Nephrolithiasis 2004   painless hematuria  . Short gut syndrome    bowel resection , 2004  . Upper GI bleed 2004   Multiple episodes of melena-? due to gastritis or adverse drug effect (nonsteroidals, small bowel ulceration with Fosamax); caused by Pepto-Bismol during one Emergency Department evaluation    Past Surgical History:  Procedure Laterality Date  . ABDOMINAL HYSTERECTOMY     emergency s/p delivery  . ABDOMINAL HYSTERECTOMY  1960   massive  gynecologic bleeding  . BOWEL RESECTION     Resulting short gut syndrome  . CHOLECYSTECTOMY N/A 06/25/2018   Procedure: LAPAROSCOPIC CHOLECYSTECTOMY WITH INTRAOPERATIVE CHOLANGIOGRAM;  Surgeon: Armandina Gemma, MD;  Location: [facility];  Service: General;  Laterality: N/A;  . COLONOSCOPY W/ POLYPECTOMY  2005   Lipoma; diverticulosis  . COLONOSCOPY WITH ESOPHAGOGASTRODUODENOSCOPY (EGD)  11/22/2012   Rehman  . LAPAROSCOPIC LYSIS OF ADHESIONS  1965   s/p adhesions  . MASTECTOMY  right breast  . MASTECTOMY     Carcinoma of the breast; right  . UPPER GASTROINTESTINAL ENDOSCOPY      Family History  Problem Relation Age of Onset  . Anuerysm Father   . Rheum arthritis Sister   . Healthy Sister   . COPD Sister   . Healthy Brother   . Cancer Unknown   . Colon cancer Neg Hx    Social History   Tobacco Use  . Smoking status: Former Smoker    Packs/day: 1.50    Years: 20.00    Pack years: 30.00    Types: Cigarettes  . Smokeless tobacco: Never Used  Substance Use Topics  . Alcohol use: No  . Drug use: No    Allergies  Allergen Reactions  . Codeine Nausea And Vomiting    Current Meds  Medication Sig  . ALPRAZolam (XANAX) 1 MG  tablet Take 1 mg by mouth at bedtime as needed for sleep.  . Biotin 10000 MCG TABS Take 6,000 mcg by mouth daily.  . Calcium Carb-Cholecalciferol (CALCIUM+D3) 600-800 MG-UNIT TABS Take 1 tablet by mouth daily.  . cholestyramine light (PREVALITE) 4 g packet Take 4 g by mouth 2 (two) times daily.  . Cyanocobalamin (VITAMIN B-12 IJ) Inject as directed every 30 (thirty) days.  . DULoxetine (CYMBALTA) 60 MG capsule Take 60 mg by mouth every morning.   Marland Kitchen levothyroxine (SYNTHROID, LEVOTHROID) 75 MCG tablet Take 75 mcg by mouth daily before breakfast.  . Multiple Vitamin (MULTIVITAMIN) tablet Take 1 tablet by mouth every morning.   . Omega-3 Fatty Acids (FISH OIL) 1000 MG CAPS Take 1,000 mg by mouth every morning.   Marland Kitchen omeprazole (PRILOSEC) 20 MG capsule Take 1 capsule  (20 mg total) by mouth daily before breakfast.  . traMADol (ULTRAM) 50 MG tablet Take 1-2 tablets (50-100 mg total) by mouth every 6 (six) hours as needed for moderate pain.    BP 138/82   Pulse 78   Ht 5\' 1"  (1.549 m)   Wt 117 lb (53.1 kg)   BMI 22.11 kg/m   Physical Exam Body habitus is ectomorphic patient well-groomed Oriented x3 Mood pleasant affect normal Gait and station unremarkable no limp Ortho Exam  Obvious scoliosis lumbar spine tenderness in the lower segments and left side tenderness to palpation skin normal normal muscle tone no tension decreased flexion extension rotation  Distal pulses normal bilaterally normal sensation bilaterally no pathologic reflexes deep tendon reflexes in each leg normal coordination test normal  Gross motor strength both lower extremities normal MEDICAL DECISION SECTION  Xrays were done at Office  My independent reading of xrays:  Degenerative scoliosis and spondylosis  Encounter Diagnosis  Name Primary?  . Acute back pain less than 4 weeks duration Yes    PLAN: (Rx., injectx, surgery, frx, mri/ct) Prednisone Dosepak Heat Tramadol as needed Follow-up as needed  No orders of the defined types were placed in this encounter.   Arther Abbott, MD  10/18/2018 11:02 AM

## 2018-10-22 ENCOUNTER — Other Ambulatory Visit (INDEPENDENT_AMBULATORY_CARE_PROVIDER_SITE_OTHER): Payer: Self-pay | Admitting: *Deleted

## 2018-10-22 DIAGNOSIS — E611 Iron deficiency: Secondary | ICD-10-CM

## 2018-11-03 ENCOUNTER — Other Ambulatory Visit (INDEPENDENT_AMBULATORY_CARE_PROVIDER_SITE_OTHER): Payer: Self-pay | Admitting: *Deleted

## 2018-11-03 ENCOUNTER — Encounter (INDEPENDENT_AMBULATORY_CARE_PROVIDER_SITE_OTHER): Payer: Self-pay | Admitting: *Deleted

## 2018-11-03 DIAGNOSIS — E611 Iron deficiency: Secondary | ICD-10-CM

## 2018-12-06 DIAGNOSIS — E611 Iron deficiency: Secondary | ICD-10-CM | POA: Diagnosis not present

## 2018-12-06 LAB — CBC
HCT: 31.3 % — ABNORMAL LOW (ref 35.0–45.0)
Hemoglobin: 10.1 g/dL — ABNORMAL LOW (ref 11.7–15.5)
MCH: 27 pg (ref 27.0–33.0)
MCHC: 32.3 g/dL (ref 32.0–36.0)
MCV: 83.7 fL (ref 80.0–100.0)
MPV: 10.5 fL (ref 7.5–12.5)
PLATELETS: 209 10*3/uL (ref 140–400)
RBC: 3.74 10*6/uL — AB (ref 3.80–5.10)
RDW: 15.2 % — ABNORMAL HIGH (ref 11.0–15.0)
WBC: 7.2 10*3/uL (ref 3.8–10.8)

## 2018-12-10 ENCOUNTER — Other Ambulatory Visit (INDEPENDENT_AMBULATORY_CARE_PROVIDER_SITE_OTHER): Payer: Self-pay | Admitting: *Deleted

## 2018-12-10 DIAGNOSIS — D649 Anemia, unspecified: Secondary | ICD-10-CM

## 2018-12-25 ENCOUNTER — Encounter (HOSPITAL_COMMUNITY): Payer: Self-pay | Admitting: Emergency Medicine

## 2018-12-25 ENCOUNTER — Observation Stay (HOSPITAL_COMMUNITY)
Admission: EM | Admit: 2018-12-25 | Discharge: 2018-12-26 | Disposition: A | Discharge status: Home / Self Care | Payer: Medicare Other | Attending: Internal Medicine | Admitting: Internal Medicine

## 2018-12-25 ENCOUNTER — Other Ambulatory Visit: Payer: Self-pay

## 2018-12-25 ENCOUNTER — Emergency Department (HOSPITAL_COMMUNITY): Payer: Medicare Other

## 2018-12-25 DIAGNOSIS — E039 Hypothyroidism, unspecified: Secondary | ICD-10-CM | POA: Diagnosis present

## 2018-12-25 DIAGNOSIS — N183 Chronic kidney disease, stage 3 unspecified: Secondary | ICD-10-CM | POA: Diagnosis present

## 2018-12-25 DIAGNOSIS — Z79890 Hormone replacement therapy (postmenopausal): Secondary | ICD-10-CM | POA: Insufficient documentation

## 2018-12-25 DIAGNOSIS — R0789 Other chest pain: Secondary | ICD-10-CM | POA: Diagnosis not present

## 2018-12-25 DIAGNOSIS — Z885 Allergy status to narcotic agent status: Secondary | ICD-10-CM | POA: Insufficient documentation

## 2018-12-25 DIAGNOSIS — D649 Anemia, unspecified: Secondary | ICD-10-CM | POA: Diagnosis present

## 2018-12-25 DIAGNOSIS — I491 Atrial premature depolarization: Secondary | ICD-10-CM | POA: Diagnosis not present

## 2018-12-25 DIAGNOSIS — Z9011 Acquired absence of right breast and nipple: Secondary | ICD-10-CM | POA: Diagnosis not present

## 2018-12-25 DIAGNOSIS — Z9049 Acquired absence of other specified parts of digestive tract: Secondary | ICD-10-CM | POA: Insufficient documentation

## 2018-12-25 DIAGNOSIS — D631 Anemia in chronic kidney disease: Secondary | ICD-10-CM | POA: Insufficient documentation

## 2018-12-25 DIAGNOSIS — K912 Postsurgical malabsorption, not elsewhere classified: Secondary | ICD-10-CM | POA: Insufficient documentation

## 2018-12-25 DIAGNOSIS — Z853 Personal history of malignant neoplasm of breast: Secondary | ICD-10-CM | POA: Insufficient documentation

## 2018-12-25 DIAGNOSIS — C50911 Malignant neoplasm of unspecified site of right female breast: Secondary | ICD-10-CM | POA: Diagnosis not present

## 2018-12-25 DIAGNOSIS — Z23 Encounter for immunization: Secondary | ICD-10-CM | POA: Diagnosis not present

## 2018-12-25 DIAGNOSIS — K219 Gastro-esophageal reflux disease without esophagitis: Secondary | ICD-10-CM

## 2018-12-25 DIAGNOSIS — R000 Tachycardia, unspecified: Secondary | ICD-10-CM | POA: Diagnosis not present

## 2018-12-25 DIAGNOSIS — Z87891 Personal history of nicotine dependence: Secondary | ICD-10-CM | POA: Insufficient documentation

## 2018-12-25 DIAGNOSIS — R079 Chest pain, unspecified: Secondary | ICD-10-CM | POA: Diagnosis not present

## 2018-12-25 DIAGNOSIS — I4891 Unspecified atrial fibrillation: Secondary | ICD-10-CM | POA: Diagnosis not present

## 2018-12-25 DIAGNOSIS — F419 Anxiety disorder, unspecified: Secondary | ICD-10-CM | POA: Insufficient documentation

## 2018-12-25 DIAGNOSIS — F329 Major depressive disorder, single episode, unspecified: Secondary | ICD-10-CM | POA: Diagnosis not present

## 2018-12-25 DIAGNOSIS — C50919 Malignant neoplasm of unspecified site of unspecified female breast: Secondary | ICD-10-CM | POA: Diagnosis present

## 2018-12-25 DIAGNOSIS — N184 Chronic kidney disease, stage 4 (severe): Secondary | ICD-10-CM | POA: Diagnosis present

## 2018-12-25 DIAGNOSIS — I10 Essential (primary) hypertension: Secondary | ICD-10-CM | POA: Diagnosis present

## 2018-12-25 DIAGNOSIS — I129 Hypertensive chronic kidney disease with stage 1 through stage 4 chronic kidney disease, or unspecified chronic kidney disease: Secondary | ICD-10-CM | POA: Diagnosis not present

## 2018-12-25 DIAGNOSIS — I080 Rheumatic disorders of both mitral and aortic valves: Secondary | ICD-10-CM | POA: Insufficient documentation

## 2018-12-25 DIAGNOSIS — I480 Paroxysmal atrial fibrillation: Secondary | ICD-10-CM | POA: Diagnosis not present

## 2018-12-25 DIAGNOSIS — Z79899 Other long term (current) drug therapy: Secondary | ICD-10-CM | POA: Insufficient documentation

## 2018-12-25 DIAGNOSIS — I493 Ventricular premature depolarization: Secondary | ICD-10-CM | POA: Insufficient documentation

## 2018-12-25 LAB — CBC WITH DIFFERENTIAL/PLATELET
ABS IMMATURE GRANULOCYTES: 0.03 10*3/uL (ref 0.00–0.07)
BASOS ABS: 0.1 10*3/uL (ref 0.0–0.1)
BASOS PCT: 1 %
Eosinophils Absolute: 0.5 10*3/uL (ref 0.0–0.5)
Eosinophils Relative: 5 %
HCT: 34.4 % — ABNORMAL LOW (ref 36.0–46.0)
HEMOGLOBIN: 10.6 g/dL — AB (ref 12.0–15.0)
IMMATURE GRANULOCYTES: 0 %
LYMPHS PCT: 11 %
Lymphs Abs: 1 10*3/uL (ref 0.7–4.0)
MCH: 26.4 pg (ref 26.0–34.0)
MCHC: 30.8 g/dL (ref 30.0–36.0)
MCV: 85.6 fL (ref 80.0–100.0)
Monocytes Absolute: 0.8 10*3/uL (ref 0.1–1.0)
Monocytes Relative: 9 %
NEUTROS ABS: 6.4 10*3/uL (ref 1.7–7.7)
NEUTROS PCT: 74 %
NRBC: 0 % (ref 0.0–0.2)
PLATELETS: 250 10*3/uL (ref 150–400)
RBC: 4.02 MIL/uL (ref 3.87–5.11)
RDW: 16.3 % — ABNORMAL HIGH (ref 11.5–15.5)
WBC: 8.7 10*3/uL (ref 4.0–10.5)

## 2018-12-25 LAB — COMPREHENSIVE METABOLIC PANEL
ALBUMIN: 3.7 g/dL (ref 3.5–5.0)
ALT: 14 U/L (ref 0–44)
AST: 17 U/L (ref 15–41)
Alkaline Phosphatase: 59 U/L (ref 38–126)
Anion gap: 10 (ref 5–15)
BUN: 20 mg/dL (ref 8–23)
CHLORIDE: 113 mmol/L — AB (ref 98–111)
CO2: 18 mmol/L — AB (ref 22–32)
CREATININE: 1.08 mg/dL — AB (ref 0.44–1.00)
Calcium: 9.1 mg/dL (ref 8.9–10.3)
GFR calc Af Amer: 52 mL/min — ABNORMAL LOW (ref >60)
GFR calc non Af Amer: 45 mL/min — ABNORMAL LOW (ref >60)
GLUCOSE: 107 mg/dL — AB (ref 70–99)
Potassium: 3.3 mmol/L — ABNORMAL LOW (ref 3.5–5.1)
SODIUM: 141 mmol/L (ref 135–145)
Total Bilirubin: 0.6 mg/dL (ref 0.3–1.2)
Total Protein: 6.8 g/dL (ref 6.5–8.1)

## 2018-12-25 LAB — MRSA PCR SCREENING: MRSA BY PCR: NEGATIVE

## 2018-12-25 LAB — TROPONIN I
Troponin I: <0.03 ng/mL (ref <0.03)
Troponin I: 0.03 ng/mL (ref <0.03)

## 2018-12-25 LAB — TSH: TSH: 1.426 u[IU]/mL (ref 0.350–4.500)

## 2018-12-25 MED ORDER — DILTIAZEM HCL 100 MG IV SOLR
5.0000 mg/h | INTRAVENOUS | Status: DC
  Administered 2018-12-25: 5 mg/h via INTRAVENOUS
  Filled 2018-12-25: qty 100

## 2018-12-25 MED ORDER — DULOXETINE HCL 60 MG PO CPEP
60.0000 mg | ORAL_CAPSULE | Freq: Every day | ORAL | Status: DC
  Administered 2018-12-25 – 2018-12-26 (×2): 60 mg via ORAL
  Filled 2018-12-25 (×2): qty 1

## 2018-12-25 MED ORDER — HEPARIN SODIUM (PORCINE) 5000 UNIT/ML IJ SOLN
5000.0000 [IU] | Freq: Three times a day (TID) | INTRAMUSCULAR | Status: DC
  Administered 2018-12-25 – 2018-12-26 (×4): 5000 [IU] via SUBCUTANEOUS
  Filled 2018-12-25 (×4): qty 1

## 2018-12-25 MED ORDER — TRAZODONE HCL 50 MG PO TABS
50.0000 mg | ORAL_TABLET | Freq: Once | ORAL | Status: AC
  Administered 2018-12-26: 50 mg via ORAL
  Filled 2018-12-25: qty 1

## 2018-12-25 MED ORDER — INFLUENZA VAC SPLIT HIGH-DOSE 0.5 ML IM SUSY
0.5000 mL | PREFILLED_SYRINGE | INTRAMUSCULAR | Status: AC
  Administered 2018-12-26: 0.5 mL via INTRAMUSCULAR
  Filled 2018-12-25: qty 0.5

## 2018-12-25 MED ORDER — ACETAMINOPHEN 325 MG PO TABS: 650.0000 mg | ORAL_TABLET | Freq: Four times a day (QID) | ORAL | Status: DC | PRN

## 2018-12-25 MED ORDER — DILTIAZEM HCL 25 MG/5ML IV SOLN
10.0000 mg | Freq: Once | INTRAVENOUS | Status: AC
  Administered 2018-12-25: 10 mg via INTRAVENOUS

## 2018-12-25 MED ORDER — LEVOTHYROXINE SODIUM 75 MCG PO TABS
75.0000 ug | ORAL_TABLET | Freq: Every day | ORAL | Status: DC
  Administered 2018-12-26: 75 ug via ORAL
  Filled 2018-12-25: qty 1

## 2018-12-25 MED ORDER — METOPROLOL TARTRATE 25 MG PO TABS
12.5000 mg | ORAL_TABLET | Freq: Two times a day (BID) | ORAL | Status: DC
  Administered 2018-12-25 – 2018-12-26 (×3): 12.5 mg via ORAL
  Filled 2018-12-25 (×3): qty 1

## 2018-12-25 MED ORDER — ALPRAZOLAM 0.5 MG PO TABS
0.5000 mg | ORAL_TABLET | Freq: Every evening | ORAL | Status: DC | PRN
  Administered 2018-12-25: 0.5 mg via ORAL
  Filled 2018-12-25: qty 1

## 2018-12-25 MED ORDER — PANTOPRAZOLE SODIUM 40 MG PO TBEC
40.0000 mg | DELAYED_RELEASE_TABLET | Freq: Every day | ORAL | Status: DC
  Administered 2018-12-25 – 2018-12-26 (×2): 40 mg via ORAL
  Filled 2018-12-25 (×2): qty 1

## 2018-12-25 NOTE — Progress Notes (Signed)
Upon admission to the ICU pt was asked about life saving procedures and if she would like for CPR and intubation to be done if necessary and pt stated she would like to be a FULL code.

## 2018-12-25 NOTE — ED Provider Notes (Addendum)
[facility] EMERGENCY DEPARTMENT Provider Note   CSN: [id number] Arrival date & time: 12/25/18  7510     History   Chief Complaint Chief Complaint  Patient presents with  . Chest Pain    HPI Jane Andrews is a 85 y.o. female.  Patient complains of chest discomfort.  No shortness of breath.  Patient has palpitation  The history is provided by the patient. No language interpreter was used.  Chest Pain  Pain location:  L chest Pain quality: aching   Pain radiates to:  Does not radiate Pain severity:  Moderate Duration:  2 hours Timing:  Constant Progression:  Worsening Chronicity:  New Context: not breathing   Relieved by:  Nothing Associated symptoms: no abdominal pain, no back pain, no cough, no fatigue and no headache     Past Medical History:  Diagnosis Date  . Abdominal adhesions 1994  . Allergic rhinitis   . Anemia   . Anxiety and depression   . Arthritis   . Atrial fibrillation ([place]) 10/2012   Associated with severe anemia and esophageal pill impaction  . Breast carcinoma ([place])    right mastectomy "25+ years ago"  . Cholelithiasis    asymptomatic  . Gastroesophageal reflux disease    Hiatal hernia  . Heart murmur    "nothing to be concerned about  . History of blood transfusion   . History of kidney stones    Xray   . Hypertension    not on medication  . Hypothyroid   . Low back pain   . 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 Nephrolithiasis 2004   painless hematuria  . Short gut syndrome    bowel resection , 2004  . Upper GI bleed 2004   Multiple episodes of melena-? due to gastritis or adverse drug effect (nonsteroidals, small bowel ulceration with Fosamax); caused by Pepto-Bismol during one Emergency Department evaluation    Patient Active Problem List   Diagnosis Date Noted  . Chronic cholecystitis s/p lap cholecystectomy 06/25/2018 06/18/2018  . Obstipation 04/12/2016  . Vagal reaction  04/12/2016  . Abdominal pain 09/19/2014  . 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 Ecchymoses, spontaneous 05/31/2013  . Hypothyroid   . Atrial fibrillation ([place])   . Breast carcinoma ([place])   . Malabsorption   . CKD (chronic kidney disease) stage 3, GFR 30-59 ml/min (HCC) 10/21/2012  . Anemia, normocytic normochromic 07/06/2012  . Chronic diarrhea 02/10/2012  . Hypertension 02/10/2012    Past Surgical History:  Procedure Laterality Date  . ABDOMINAL HYSTERECTOMY     emergency s/p delivery  . ABDOMINAL HYSTERECTOMY  1960   massive gynecologic bleeding  . BOWEL RESECTION     Resulting short gut syndrome  . CHOLECYSTECTOMY N/A 06/25/2018   Procedure: LAPAROSCOPIC CHOLECYSTECTOMY WITH INTRAOPERATIVE CHOLANGIOGRAM;  Surgeon: Armandina Gemma, MD;  Location: [facility] ORS;  Service: General;  Laterality: N/A;  . COLONOSCOPY W/ POLYPECTOMY  2005   Lipoma; diverticulosis  . COLONOSCOPY WITH ESOPHAGOGASTRODUODENOSCOPY (EGD)  11/22/2012   Rehman  . LAPAROSCOPIC LYSIS OF ADHESIONS  1965   s/p adhesions  . MASTECTOMY  right breast  . MASTECTOMY     Carcinoma of the breast; right  . UPPER GASTROINTESTINAL ENDOSCOPY       OB History   No obstetric history on file.      Home Medications    Prior to Admission medications   Medication Sig Start Date End Date Taking? Authorizing Provider  ALPRAZolam (XANAX) 1 MG tablet Take 1 mg by mouth at bedtime as needed for sleep.    [provider]  Biotin 10000 MCG TABS Take 6,000 mcg by mouth daily.    [provider]  Calcium Carb-Cholecalciferol (CALCIUM+D3) 600-800 MG-UNIT TABS Take 1 tablet by mouth daily.    [provider]  cholestyramine light (PREVALITE) 4 g packet Take 4 g by mouth 2 (two) times daily.    [provider]  Cyanocobalamin (VITAMIN B-12 IJ) Inject as directed every 30 (thirty) days.    [provider]  DULoxetine (CYMBALTA) 60 MG capsule Take 60 mg by mouth every morning.      [provider]  levothyroxine (SYNTHROID, LEVOTHROID) 75 MCG tablet Take 75 mcg by mouth daily before breakfast.    [provider]  Multiple Vitamin (MULTIVITAMIN) tablet Take 1 tablet by mouth every morning.     [provider]  Omega-3 Fatty Acids (FISH OIL) 1000 MG CAPS Take 1,000 mg by mouth every morning.     [provider]  omeprazole (PRILOSEC) 20 MG capsule Take 1 capsule (20 mg total) by mouth daily before breakfast. 10/12/18   Rehman, Mechele Dawley, MD  traMADol (ULTRAM) 50 MG tablet Take 1-2 tablets (50-100 mg total) by mouth every 6 (six) hours as needed for moderate pain. 06/25/18   Armandina Gemma, MD    Family History Family History  Problem Relation Age of Onset  . Anuerysm Father   . Rheum arthritis Sister   . Healthy Sister   . COPD Sister   . Healthy Brother   . Cancer Other   . Colon cancer Neg Hx     Social History Social History   Tobacco Use  . Smoking status: Former Smoker    Packs/day: 1.50    Years: 20.00    Pack years: 30.00    Types: Cigarettes  . Smokeless tobacco: Never Used  Substance Use Topics  . Alcohol use: No  . Drug use: No     Allergies   Codeine   Review of Systems Review of Systems  Constitutional: Negative for appetite change and fatigue.  HENT: Negative for congestion, ear discharge and sinus pressure.   Eyes: Negative for discharge.  Respiratory: Negative for cough.   Cardiovascular: Positive for chest pain.  Gastrointestinal: Negative for abdominal pain and diarrhea.  Genitourinary: Negative for frequency and hematuria.  Musculoskeletal: Negative for back pain.  Skin: Negative for rash.  Neurological: Negative for seizures and headaches.  Psychiatric/Behavioral: Negative for hallucinations.     Physical Exam Updated Vital Signs BP 111/88   Pulse (!) 143   Temp 98 F (36.7 C) (Oral)   Resp 14   Ht 5\' 1"  (1.549 m)   Wt 49.9 kg   SpO2 100%   BMI 20.78 kg/m   Physical  Exam Vitals signs reviewed.  Constitutional:      Appearance: She is well-developed.  HENT:     Head: Normocephalic.  Eyes:     General: No scleral icterus.    Conjunctiva/sclera: Conjunctivae normal.  Neck:     Musculoskeletal: Neck supple.     Thyroid: No thyromegaly.  Cardiovascular:     Heart sounds: No murmur. No friction rub. No gallop.      Comments: Rapid irregular rate Pulmonary:     Breath sounds: No stridor. No wheezing or rales.  Chest:     Chest wall: No tenderness.  Abdominal:     General: There is no distension.  Tenderness: There is no abdominal tenderness. There is no rebound.  Musculoskeletal: Normal range of motion.  Lymphadenopathy:     Cervical: No cervical adenopathy.  Skin:    Findings: No erythema or rash.  Neurological:     Mental Status: She is alert and oriented to person, place, and time.     Motor: No abnormal muscle tone.     Coordination: Coordination normal.  Psychiatric:        Behavior: Behavior normal.      ED Treatments / Results  Labs (all labs ordered are listed, but only abnormal results are displayed) Labs Reviewed  CBC WITH DIFFERENTIAL/PLATELET - Abnormal; Notable for the following components:      Result Value   Hemoglobin 10.6 (*)    HCT 34.4 (*)    RDW 16.3 (*)    All other components within normal limits  COMPREHENSIVE METABOLIC PANEL - Abnormal; Notable for the following components:   Potassium 3.3 (*)    Chloride 113 (*)    CO2 18 (*)    Glucose, Bld 107 (*)    Creatinine, Ser 1.08 (*)    GFR calc non Af Amer 45 (*)    GFR calc Af Amer 52 (*)    All other components within normal limits  TROPONIN I    EKG EKG Interpretation  Date/Time:  Saturday December 25 2018 09:19:06 EST Ventricular Rate:  134 PR Interval:    QRS Duration: 79 QT Interval:  313 QTC Calculation: 468 R Axis:   -12 Text Interpretation:  Atrial fibrillation with rapid V-rate Ventricular premature complex Probable posterior infarct,  recent Lateral leads are also involved Confirmed by Milton Ferguson [phone]) on 12/25/2018 9:37:49 AM   Radiology Dg Chest Portable 1 View  Result Date: 12/25/2018 CLINICAL DATA:  85 year old female with weakness and chest pain since 7:30 a.m. EXAM: PORTABLE CHEST 1 VIEW COMPARISON:  Prior chest x-ray 03/05/2018; prior chest CT 08/24/2017 FINDINGS: The lungs are clear and negative for focal airspace consolidation, pulmonary edema or suspicious pulmonary nodule. Relatively increased density overlying the right mid lung secondary to overlying breast reconstruction implant. Patient is status post right-sided mastectomy and right axillary nodal dissection. No pleural effusion or pneumothorax. Cardiac and mediastinal contours are within normal limits. No acute fracture or lytic or blastic osseous lesions. The visualized upper abdominal bowel gas pattern is unremarkable. IMPRESSION: No active disease. Electronically Signed   By: Jacqulynn Cadet M.D.   On: 12/25/2018 10:12    Procedures Procedures (including critical care time)  Medications Ordered in ED Medications  diltiazem (CARDIZEM) 100 mg in dextrose 5 % 100 mL (1 mg/mL) infusion (5 mg/hr Intravenous New Bag/Given 12/25/18 0948)  diltiazem (CARDIZEM) injection 10 mg (10 mg Intravenous Given 12/25/18 5573)     Initial Impression / Assessment and Plan / ED Course  I have reviewed the triage vital signs and the nursing notes.  Pertinent labs & imaging results that were available during my care of the patient were reviewed by me and considered in my medical decision making (see chart for details). CRITICAL CARE Performed by: Milton Ferguson Total critical care time: 40 minutes Critical care time was exclusive of separately billable procedures and treating other patients. Critical care was necessary to treat or prevent imminent or life-threatening deterioration. Critical care was time spent personally by me on the following activities: development  of treatment plan with patient and/or surrogate as well as nursing, discussions with consultants, evaluation of patient's response to treatment, examination of patient,  obtaining history from patient or surrogate, ordering and performing treatments and interventions, ordering and review of laboratory studies, ordering and review of radiographic studies, pulse oximetry and re-evaluation of patient's condition.     Patient with rapid atrial fib.  She responded to IV Cardizem.  She is also on a Cardizem drip.  She will be admitted to medicine.  I spoke with cardiology who recommended continuing the Cardizem until tomorrow and then transitioning her over either to Lopressor twice a day possibly 12.5 mg or immediate Cardizem release at 30 mg 3 times a day.  Final Clinical Impressions(s) / ED Diagnoses   Final diagnoses:  Atrial fibrillation with RVR [facility])    ED Discharge Orders    None       Milton Ferguson, MD 12/25/18 0063    Milton Ferguson, MD 12/25/18 1206

## 2018-12-25 NOTE — H&P (Signed)
History and Physical    Jane Andrews ZYS:[id number] DOB: [birth year] DOA: 12/25/2018  Referring MD/NP/PA: Dr. Roderic Palau PCP: Redmond School, MD  Patient coming from: Home.  Chief Complaint: chest pain.  HPI: Jane Andrews is a 85 y.o. female with PMH significant for HTN, hypothyroidism, GERD, normocytic anemia, hx of breast cancer (s/p mastectomy), anxiety/depression and prior hx of PAF (currently not taking any rate control agent or anticoagulation); who presented to ED due to acute chest pain. Patient pain started suddenly in the left side of her chest and radiating to left shoulder; constant for over 1 hour and with intensity of 8-9/10. Patient denies aggravating or alleviating factors. patient expressed vague sensation of her heart racing and feeling bad. She denies SOB, diaphoresis, nausea/vomiting, abd pain, dysuria, focal weakness or any other associated symptoms.  In the ED analgesics given, with improvement in her pain, EKG demonstrated A. Fib with RVR and troponin of 0.03. cardizem bolus and drip initiated. Cardiology consulted (Dr. Stanford Breed) and recommended rate control strategy and not need for patient to transfer to [facility] at this moment.  Past Medical/Surgical History: Past Medical History:  Diagnosis Date  . Abdominal adhesions 1994  . Allergic rhinitis   . Anemia   . Anxiety and depression   . Arthritis   . Atrial fibrillation ([place]) 10/2012   Associated with severe anemia and esophageal pill impaction  . Breast carcinoma ([place])    right mastectomy "25+ years ago"  . Cholelithiasis    asymptomatic  . Gastroesophageal reflux disease    Hiatal hernia  . Heart murmur    "nothing to be concerned about  . History of blood transfusion   . History of kidney stones    Xray   . Hypertension    not on medication  . Hypothyroid   . Low back pain   . 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 Nephrolithiasis 2004   painless  hematuria  . Short gut syndrome    bowel resection , 2004  . Upper GI bleed 2004   Multiple episodes of melena-? due to gastritis or adverse drug effect (nonsteroidals, small bowel ulceration with Fosamax); caused by Pepto-Bismol during one Emergency Department evaluation    Past Surgical History:  Procedure Laterality Date  . ABDOMINAL HYSTERECTOMY     emergency s/p delivery  . ABDOMINAL HYSTERECTOMY  1960   massive gynecologic bleeding  . BOWEL RESECTION     Resulting short gut syndrome  . CHOLECYSTECTOMY N/A 06/25/2018   Procedure: LAPAROSCOPIC CHOLECYSTECTOMY WITH INTRAOPERATIVE CHOLANGIOGRAM;  Surgeon: Armandina Gemma, MD;  Location: [facility];  Service: General;  Laterality: N/A;  . COLONOSCOPY W/ POLYPECTOMY  2005   Lipoma; diverticulosis  . COLONOSCOPY WITH ESOPHAGOGASTRODUODENOSCOPY (EGD)  11/22/2012   Rehman  . LAPAROSCOPIC LYSIS OF ADHESIONS  1965   s/p adhesions  . MASTECTOMY  right breast  . MASTECTOMY     Carcinoma of the breast; right  . UPPER GASTROINTESTINAL ENDOSCOPY      Social History:  reports that she has quit smoking. Her smoking use included cigarettes. She has a 30.00 pack-year smoking history. She has never used smokeless tobacco. She reports that she does not drink alcohol or use drugs.  Allergies: Allergies  Allergen Reactions  . Codeine Nausea And Vomiting    Family History:  Family History  Problem Relation Age of Onset  . Anuerysm Father   . Rheum arthritis Sister   . Healthy Sister   .  COPD Sister   . Healthy Brother   . Cancer Other   . Colon cancer Neg Hx     Prior to Admission medications   Medication Sig Start Date End Date Taking? Authorizing Provider  ALPRAZolam Duanne Moron) 1 MG tablet Take 1 mg by mouth at bedtime as needed for sleep.    [provider]  Biotin 10000 MCG TABS Take 6,000 mcg by mouth daily.    [provider]  Calcium Carb-Cholecalciferol (CALCIUM+D3) 600-800 MG-UNIT TABS Take 1 tablet by mouth daily.     [provider]  cholestyramine light (PREVALITE) 4 g packet Take 4 g by mouth 2 (two) times daily.    [provider]  Cyanocobalamin (VITAMIN B-12 IJ) Inject as directed every 30 (thirty) days.    [provider]  DULoxetine (CYMBALTA) 60 MG capsule Take 60 mg by mouth every morning.     [provider]  levothyroxine (SYNTHROID, LEVOTHROID) 75 MCG tablet Take 75 mcg by mouth daily before breakfast.    [provider]  Multiple Vitamin (MULTIVITAMIN) tablet Take 1 tablet by mouth every morning.     [provider]  Omega-3 Fatty Acids (FISH OIL) 1000 MG CAPS Take 1,000 mg by mouth every morning.     [provider]  omeprazole (PRILOSEC) 20 MG capsule Take 1 capsule (20 mg total) by mouth daily before breakfast. 10/12/18   Rehman, Mechele Dawley, MD  traMADol (ULTRAM) 50 MG tablet Take 1-2 tablets (50-100 mg total) by mouth every 6 (six) hours as needed for moderate pain. 06/25/18   Armandina Gemma, MD    Review of Systems:  Neg except as otherwise mentioned on HPI.   Physical Exam: Vitals:   12/25/18 0950 12/25/18 1012 12/25/18 1018 12/25/18 1021  BP:    111/88  Pulse: (!) 143     Resp: (!) 25 (!) 21 (!) 22 14  Temp:      TempSrc:      SpO2: 98% 97% 98% 100%  Weight:      Height:        Constitutional: NAD, calm, comfortable; denies chest pain, currently no palpitations.  Afebrile, no icterus, no nystagmus. Eyes: PERRL, lids and conjunctivae normal ENMT: Mucous membranes are moist. Posterior pharynx clear of any exudate or lesions. Neck: normal, supple, no masses, no thyromegaly Respiratory: clear to auscultation bilaterally, no wheezing, no crackles. Normal respiratory effort. No accessory muscle use.  Cardiovascular: Regular rate, positive systolic ejection murmur, no rubs, no gallops.  No JVD on exam.  No extremity edema. 2+ pedal pulses. No carotid bruits.  Abdomen: no tenderness, no masses palpated. No hepatosplenomegaly.  Bowel sounds positive.  Musculoskeletal: no clubbing / cyanosis. No joint deformity upper and lower extremities. Good ROM, no contractures. Normal muscle tone.  Skin: no rashes, lesions, ulcers. No induration Neurologic: CN 2-12 grossly intact. Sensation intact, DTR normal. Strength 5/5 in all 4.  Psychiatric: Normal judgment and insight. Alert and oriented x 3. Normal mood.   Labs on Admission: I have personally reviewed the following labs and imaging studies  CBC: Recent Labs  Lab 12/25/18 0951  WBC 8.7  NEUTROABS 6.4  HGB 10.6*  HCT 34.4*  MCV 85.6  PLT 007   Basic Metabolic Panel: Recent Labs  Lab 12/25/18 0951  NA 141  K 3.3*  CL 113*  CO2 18*  GLUCOSE 107*  BUN 20  CREATININE 1.08*  CALCIUM 9.1   GFR: Estimated Creatinine Clearance: 25.6 mL/min (A) (by C-G formula based  on SCr of 1.08 mg/dL (H)).   Liver Function Tests: Recent Labs  Lab 12/25/18 0951  AST 17  ALT 14  ALKPHOS 59  BILITOT 0.6  PROT 6.8  ALBUMIN 3.7   Cardiac Enzymes: Recent Labs  Lab 12/25/18 0951  TROPONINI <0.03   Urine analysis:    Component Value Date/Time   COLORURINE YELLOW 04/12/2016 0027   APPEARANCEUR CLEAR 04/12/2016 0027   LABSPEC <1.005 (L) 04/12/2016 0027   PHURINE 6.0 04/12/2016 0027   GLUCOSEU NEGATIVE 04/12/2016 0027   HGBUR TRACE (A) 04/12/2016 0027   BILIRUBINUR NEGATIVE 04/12/2016 0027   KETONESUR TRACE (A) 04/12/2016 0027   PROTEINUR NEGATIVE 04/12/2016 0027   UROBILINOGEN 0.2 01/16/2015 0848   NITRITE NEGATIVE 04/12/2016 0027   LEUKOCYTESUR TRACE (A) 04/12/2016 0027   Radiological Exams on Admission: Dg Chest Portable 1 View  Result Date: 12/25/2018 CLINICAL DATA:  85 year old female with weakness and chest pain since 7:30 a.m. EXAM: PORTABLE CHEST 1 VIEW COMPARISON:  Prior chest x-ray 03/05/2018; prior chest CT 08/24/2017 FINDINGS: The lungs are clear and negative for focal airspace consolidation, pulmonary edema or suspicious pulmonary nodule.  Relatively increased density overlying the right mid lung secondary to overlying breast reconstruction implant. Patient is status post right-sided mastectomy and right axillary nodal dissection. No pleural effusion or pneumothorax. Cardiac and mediastinal contours are within normal limits. No acute fracture or lytic or blastic osseous lesions. The visualized upper abdominal bowel gas pattern is unremarkable. IMPRESSION: No active disease. Electronically Signed   By: Jacqulynn Cadet M.D.   On: 12/25/2018 10:12    EKG: Independently reviewed.  Patient with 2 EKGs first 1 demonstrating A. fib with RVR heart rate in the 130s to 140s; after Cardizem bolus and drip initiation patient converted to sinus rhythm and repeat EKG demonstrated sinus rhythm, no acute ischemic changes and HR of 95; normal QT.  Assessment/Plan 1-A. fib with RVR -Patient with excellent response and conversion back to sinus rhythm after Cardizem drip given. -At this moment Cardizem drip has been discontinued and the patient remains in sinus rhythm with rate control. -Following cardiology recommendation she has been started on Lopressor 12.5 mg twice a day -Given prior history of GI bleed, ongoing anemia and age, will hold on initiating anticoagulation therapy. -Will give heparin subcutaneously for DVT prophylaxis. -checking TSH and 2-D echo -cycle troponin  2-HTN -stable and well controlled currently -will monitor VS while using metoprolol -heart healthy diet ordered.  3-GERD -continue PPI  4-hx of breast cancer -s/p mastectomy  -appears to be in remission  -continue outpatient follow up  5-anxiety/depression  -no SI or hallucinations  -continue cymbalta and PRN alprazolam  6-hypothyroidism -will check TSH as mentioned above -continue synthroid  7-hx of anemia -no signs of acute bleeding -patient with chronic normocytic-normochromic anemia -will follow Hgb trend   DVT prophylaxis: heparin Code Status: Full  Code Family Communication: no family at bedside  Disposition Plan: home, hopefully tomorrow if HR remains controlled. Consults called: cardiology consulted over the phone by EDP (Dr. Stanford Breed) Admission status: observation, telemetry bed, LOS < 2 midnights.   Time Spent: 65 minutes  Barton Dubois MD [facility] Pager [phone]  If 7PM-7AM, please contact night-coverage [email or web address]   12/25/2018, 12:03 PM

## 2018-12-25 NOTE — ED Triage Notes (Signed)
PT reports chest pain starting around 730 this morning.  Two Aspirin 325mg  taken pta.

## 2018-12-25 NOTE — ED Notes (Signed)
Given lunch tray.

## 2018-12-25 NOTE — Progress Notes (Signed)
CRITICAL VALUE ALERT  Critical Value: troponin 0.03  Date & Time Notied:  12/25/18 1614  Provider Notified: Barton Dubois, MD   Orders Received/Actions taken: none at this time

## 2018-12-26 ENCOUNTER — Observation Stay (HOSPITAL_BASED_OUTPATIENT_CLINIC_OR_DEPARTMENT_OTHER): Payer: Medicare Other

## 2018-12-26 DIAGNOSIS — D649 Anemia, unspecified: Secondary | ICD-10-CM | POA: Diagnosis not present

## 2018-12-26 DIAGNOSIS — I351 Nonrheumatic aortic (valve) insufficiency: Secondary | ICD-10-CM | POA: Diagnosis not present

## 2018-12-26 DIAGNOSIS — I480 Paroxysmal atrial fibrillation: Secondary | ICD-10-CM | POA: Diagnosis not present

## 2018-12-26 DIAGNOSIS — E039 Hypothyroidism, unspecified: Secondary | ICD-10-CM | POA: Diagnosis not present

## 2018-12-26 DIAGNOSIS — I10 Essential (primary) hypertension: Secondary | ICD-10-CM | POA: Diagnosis not present

## 2018-12-26 DIAGNOSIS — N183 Chronic kidney disease, stage 3 (moderate): Secondary | ICD-10-CM | POA: Diagnosis not present

## 2018-12-26 DIAGNOSIS — I4891 Unspecified atrial fibrillation: Secondary | ICD-10-CM | POA: Diagnosis not present

## 2018-12-26 DIAGNOSIS — C50911 Malignant neoplasm of unspecified site of right female breast: Secondary | ICD-10-CM | POA: Diagnosis not present

## 2018-12-26 DIAGNOSIS — K219 Gastro-esophageal reflux disease without esophagitis: Secondary | ICD-10-CM | POA: Diagnosis not present

## 2018-12-26 LAB — CBC
HEMATOCRIT: 28.1 % — AB (ref 36.0–46.0)
HEMOGLOBIN: 8.7 g/dL — AB (ref 12.0–15.0)
MCH: 26.8 pg (ref 26.0–34.0)
MCHC: 31 g/dL (ref 30.0–36.0)
MCV: 86.5 fL (ref 80.0–100.0)
Platelets: 211 10*3/uL (ref 150–400)
RBC: 3.25 MIL/uL — ABNORMAL LOW (ref 3.87–5.11)
RDW: 16.3 % — ABNORMAL HIGH (ref 11.5–15.5)
WBC: 6.9 10*3/uL (ref 4.0–10.5)
nRBC: 0 % (ref 0.0–0.2)

## 2018-12-26 LAB — BASIC METABOLIC PANEL
Anion gap: 7 (ref 5–15)
BUN: 30 mg/dL — ABNORMAL HIGH (ref 8–23)
CO2: 20 mmol/L — ABNORMAL LOW (ref 22–32)
Calcium: 8.3 mg/dL — ABNORMAL LOW (ref 8.9–10.3)
Chloride: 114 mmol/L — ABNORMAL HIGH (ref 98–111)
Creatinine, Ser: 1.2 mg/dL — ABNORMAL HIGH (ref 0.44–1.00)
GFR calc Af Amer: 46 mL/min — ABNORMAL LOW (ref >60)
GFR calc non Af Amer: 39 mL/min — ABNORMAL LOW (ref >60)
GLUCOSE: 93 mg/dL (ref 70–99)
Potassium: 3.2 mmol/L — ABNORMAL LOW (ref 3.5–5.1)
Sodium: 141 mmol/L (ref 135–145)

## 2018-12-26 LAB — TROPONIN I: Troponin I: 0.04 ng/mL (ref <0.03)

## 2018-12-26 LAB — ECHOCARDIOGRAM COMPLETE
Height: 61 in
Weight: 1820.12 oz

## 2018-12-26 MED ORDER — METOPROLOL TARTRATE 25 MG PO TABS: 12.5000 mg | ORAL_TABLET | Freq: Two times a day (BID) | ORAL | 1 refills | Status: DC

## 2018-12-26 MED ORDER — POTASSIUM CHLORIDE CRYS ER 20 MEQ PO TBCR
40.0000 meq | EXTENDED_RELEASE_TABLET | Freq: Once | ORAL | Status: AC
  Administered 2018-12-26: 40 meq via ORAL
  Filled 2018-12-26: qty 2

## 2018-12-26 NOTE — Progress Notes (Signed)
*  PRELIMINARY RESULTS* Echocardiogram 2D Echocardiogram has been performed.  Leavy Cella 12/26/2018, 12:21 PM

## 2018-12-26 NOTE — Progress Notes (Signed)
Pt discharged from facility with stable vital signs and no complaints of pain. Discharge information given and gone over by this Rn. Pt verbalized understanding.   Jeris Penta, RN

## 2018-12-26 NOTE — Discharge Summary (Signed)
Physician Discharge Summary  Jane Andrews [id number] DOB: May 26, 1925 DOA: 12/25/2018  PCP: Redmond School, MD  Admit date: 12/25/2018 Discharge date: 12/26/2018  Time spent: 35 minutes  Recommendations for Outpatient Follow-up:  1. Repeat CBC to follow hemoglobin trend 2. Repeat basic metabolic panel to follow electrolytes and renal function   Discharge Diagnoses:  Principal Problem:   Atrial fibrillation with RVR (HCC) Active Problems:   Hypertension   Anemia, normocytic normochromic   CKD (chronic kidney disease) stage 3, GFR 30-59 ml/min (HCC)   Hypothyroid   Breast carcinoma (HCC)   GERD (gastroesophageal reflux disease)   Discharge Condition: Stable and improved.  Patient discharged home with instruction to follow-up with PCP in 2 weeks and with cardiology service in 1 week.  Diet recommendation: Heart healthy diet.  Filed Weights   12/25/18 0916 12/25/18 1408 12/26/18 0500  Weight: 49.9 kg 50.3 kg 51.6 kg    History of present illness:  85 y.o. female with PMH significant for HTN, hypothyroidism, GERD, normocytic anemia, hx of breast cancer (s/p mastectomy), anxiety/depression and prior hx of PAF (currently not taking any rate control agent or anticoagulation); who presented to ED due to acute chest pain. Patient pain started suddenly in the left side of her chest and radiating to left shoulder; constant for over 1 hour and with intensity of 8-9/10. Patient denies aggravating or alleviating factors. patient expressed vague sensation of her heart racing and feeling bad. She denies SOB, diaphoresis, nausea/vomiting, abd pain, dysuria, focal weakness or any other associated symptoms.  In the ED analgesics given, with improvement in her pain, EKG demonstrated A. Fib with RVR and troponin of 0.03. cardizem bolus and drip initiated. Cardiology consulted (Dr. Stanford Breed) and recommended rate control strategy and not need for patient to transfer to [facility] at this  moment.  Hospital Course:  1-A. fib with RVR -Excellent response and conversion back to sinus rhythm with the use of Cardizem. -Following cardiology recommendations patient has been discharged on Lopressor 12.5 mg twice a day and instructions to follow-up with cardiology service as an outpatient. -Patient 2D echo was reassuring with no wall motion abnormalities and preserved ejection fraction.  Grade 1 diastolic dysfunction only. -Normal TSH. -No anticoagulation has been initiated at this time given prior history of GI bleed and ongoing normocytic anemia with hemoglobin in the 8 range. -Patient is open to further discuss during her cardiology appointment follow-up visit the need of future anticoagulation therapy.  2-hypertension -Well-controlled with the use of metoprolol. -Heart healthy diet has been recommended.  3-gastroesophageal reflux disease -Continue PPI.  4-history of breast cancer -Status post mastectomy -Appears to be in remission -Continue outpatient follow-up.  5-anxiety/depression -No suicidal ideation or hallucination. -Continue the use of Cymbalta and as needed alprazolam.  6-history of hypothyroidism -Continue the use of Synthroid -TSH within normal limits.  7-history of anemia -No signs of acute bleeding at this time -Repeat CBC to follow hemoglobin trend -Patient had a history of chronic normocytic-normochromic anemia; most likely associated with her history of chronic renal failure.  8-chronic kidney disease is stage III -Appears to be stable and at baseline -Repeat basic metabolic panel at follow-up visit to reassess electrolytes trend and renal function.  Procedures:  2D echo - Left ventricle: The cavity size was normal. Systolic function was   normal. The estimated ejection fraction was in the range of 55%   to 60%. Although no diagnostic regional wall motion abnormality   was identified, this possibility cannot be completely excluded on  the  basis of this study. Doppler parameters are consistent with   abnormal left ventricular relaxation (grade 1 diastolic   dysfunction). Stroke volume (LVOT, Doppler): 68.5 ml. Stroke   volume/bsa (LVOT, Doppler): 45.9 ml/m^2. - Aortic valve: Valve mobility was moderately restricted. There was   mild stenosis. There was no regurgitation. Mean gradient (S): 11   mm Hg. Valve area (VTI): 1.29 cm^2. - Mitral valve: Mildly calcified annulus. There was trivial   regurgitation. - Left atrium: The atrium was mildly dilated. - Right ventricle: The cavity size was normal. Wall thickness was   normal. Systolic function was normal. - Right atrium: The atrium was normal in size. - Tricuspid valve: There was trivial regurgitation. - Inferior vena cava: The vessel was normal in size. - Pericardium, extracardiac: There was no pericardial effusion.  Consultations:  Cardiology was consulted over the phone by EDP (Dr. Stanford Breed; recommended to control patient's heart rate using initially Cardizem drip with subsequent transition to oral Lopressor.  Patient will follow-up with cardiology service as an outpatient).  Discharge Exam: Vitals:   12/26/18 0909 12/26/18 1130  BP: (!) 94/53   Pulse: 83   Resp:    Temp:  98.3 F (36.8 C)  SpO2:      General: Afebrile, no shortness of breath, no chest pain, reports no further palpitations, lightheadedness or dizziness sensation. Cardiovascular: S1 and S2, no rubs, no gallops, positive systolic ejection murmur.  No JVD on exam. Respiratory: Good air movement bilaterally, no wheezing, no crackles, no using accessory muscles. Abdomen: Soft, nontender, nondistended, positive bowel sounds. Extremities: No edema, no cyanosis, no clubbing.  Discharge Instructions   Discharge Instructions    Diet - low sodium heart healthy   Complete by:  As directed    Discharge instructions   Complete by:  As directed    Take medications as prescribed. Follow up with  cardiology service in 1 week. Maintain adequate hydration. Arrange follow up with PCP in 2 weeks. Follow heart healthy diet.     Allergies as of 12/26/2018      Reactions   Codeine Nausea And Vomiting      Medication List    TAKE these medications   ALPRAZolam 1 MG tablet Commonly known as:  XANAX Take 1 mg by mouth at bedtime as needed for sleep.   Biotin 10000 MCG Tabs Take 6,000 mcg by mouth daily.   CALCIUM+D3 600-800 MG-UNIT Tabs Generic drug:  Calcium Carb-Cholecalciferol Take 1 tablet by mouth daily.   cholestyramine light 4 g packet Commonly known as:  PREVALITE Take 4 g by mouth 2 (two) times daily.   DULoxetine 60 MG capsule Commonly known as:  CYMBALTA Take 60 mg by mouth every morning.   Fish Oil 1000 MG Caps Take 1,000 mg by mouth every morning.   levothyroxine 75 MCG tablet Commonly known as:  SYNTHROID, LEVOTHROID Take 75 mcg by mouth daily before breakfast.   metoprolol tartrate 25 MG tablet Commonly known as:  LOPRESSOR Take 0.5 tablets (12.5 mg total) by mouth 2 (two) times daily.   multivitamin tablet Take 1 tablet by mouth every morning.   omeprazole 20 MG capsule Commonly known as:  PRILOSEC Take 1 capsule (20 mg total) by mouth daily before breakfast.   traMADol 50 MG tablet Commonly known as:  ULTRAM Take 1-2 tablets (50-100 mg total) by mouth every 6 (six) hours as needed for moderate pain.   VITAMIN B-12 IJ Inject as directed every 30 (thirty) days.  Allergies  Allergen Reactions  . Codeine Nausea And Vomiting   Follow-up Information    Redmond School, MD. Schedule an appointment as soon as possible for a visit in 2 week(s).   Specialty:  Internal Medicine Contact information: [address] [place] [address] [phone]        Satira Sark, MD. Schedule an appointment as soon as possible for a visit in 1 week(s).   Specialty:  Cardiology Contact information: [place]  [address] [phone]           The results of significant diagnostics from this hospitalization (including imaging, microbiology, ancillary and laboratory) are listed below for reference.    Significant Diagnostic Studies: Dg Chest Portable 1 View  Result Date: 12/25/2018 CLINICAL DATA:  85 year old female with weakness and chest pain since 7:30 a.m. EXAM: PORTABLE CHEST 1 VIEW COMPARISON:  Prior chest x-ray 03/05/2018; prior chest CT 08/24/2017 FINDINGS: The lungs are clear and negative for focal airspace consolidation, pulmonary edema or suspicious pulmonary nodule. Relatively increased density overlying the right mid lung secondary to overlying breast reconstruction implant. Patient is status post right-sided mastectomy and right axillary nodal dissection. No pleural effusion or pneumothorax. Cardiac and mediastinal contours are within normal limits. No acute fracture or lytic or blastic osseous lesions. The visualized upper abdominal bowel gas pattern is unremarkable. IMPRESSION: No active disease. Electronically Signed   By: Jacqulynn Cadet M.D.   On: 12/25/2018 10:12    Microbiology: Recent Results (from the past 240 hour(s))  MRSA PCR Screening     Status: None   Collection Time: 12/25/18  2:08 PM  Result Value Ref Range Status   MRSA by PCR NEGATIVE NEGATIVE Final    Comment:        The GeneXpert MRSA Assay (FDA approved for NASAL specimens only), is one component of a comprehensive MRSA colonization surveillance program. It is not intended to diagnose MRSA infection nor to guide or monitor treatment for MRSA infections. Performed at [facility], [address] [place], [place] [address]      Labs: Basic Metabolic Panel: Recent Labs  Lab 12/25/18 0951 12/26/18 0508  NA 141 141  K 3.3* 3.2*  CL 113* 114*  CO2 18* 20*  GLUCOSE 107* 93  BUN 20 30*  CREATININE 1.08* 1.20*  CALCIUM 9.1 8.3*   Liver Function Tests: Recent Labs  Lab 12/25/18 0951  AST 17   ALT 14  ALKPHOS 59  BILITOT 0.6  PROT 6.8  ALBUMIN 3.7   CBC: Recent Labs  Lab 12/25/18 0951 12/26/18 0508  WBC 8.7 6.9  NEUTROABS 6.4  --   HGB 10.6* 8.7*  HCT 34.4* 28.1*  MCV 85.6 86.5  PLT 250 211   Cardiac Enzymes: Recent Labs  Lab 12/25/18 0951 12/25/18 1514 12/25/18 2343  TROPONINI <0.03 0.03* 0.04*   Signed:  Barton Dubois MD.  [facility] 12/26/2018, 4:51 PM

## 2018-12-26 NOTE — Progress Notes (Signed)
Pt was walked around the unit. She stayed in NSR the highest her HR got was 105 and the lowest was 78   [employer], RN

## 2018-12-29 DIAGNOSIS — E039 Hypothyroidism, unspecified: Secondary | ICD-10-CM | POA: Diagnosis not present

## 2018-12-29 DIAGNOSIS — R079 Chest pain, unspecified: Secondary | ICD-10-CM | POA: Diagnosis not present

## 2018-12-29 DIAGNOSIS — Z681 Body mass index (BMI) 19 or less, adult: Secondary | ICD-10-CM | POA: Diagnosis not present

## 2018-12-29 DIAGNOSIS — Z1389 Encounter for screening for other disorder: Secondary | ICD-10-CM | POA: Diagnosis not present

## 2018-12-29 DIAGNOSIS — I4891 Unspecified atrial fibrillation: Secondary | ICD-10-CM | POA: Diagnosis not present

## 2018-12-29 DIAGNOSIS — I700 Atherosclerosis of aorta: Secondary | ICD-10-CM | POA: Diagnosis not present

## 2019-01-19 DIAGNOSIS — M5481 Occipital neuralgia: Secondary | ICD-10-CM | POA: Diagnosis not present

## 2019-01-19 DIAGNOSIS — E063 Autoimmune thyroiditis: Secondary | ICD-10-CM | POA: Diagnosis not present

## 2019-01-19 DIAGNOSIS — Z681 Body mass index (BMI) 19 or less, adult: Secondary | ICD-10-CM | POA: Diagnosis not present

## 2019-01-19 DIAGNOSIS — I10 Essential (primary) hypertension: Secondary | ICD-10-CM | POA: Diagnosis not present

## 2019-01-19 DIAGNOSIS — M47812 Spondylosis without myelopathy or radiculopathy, cervical region: Secondary | ICD-10-CM | POA: Diagnosis not present

## 2019-01-19 DIAGNOSIS — I4891 Unspecified atrial fibrillation: Secondary | ICD-10-CM | POA: Diagnosis not present

## 2019-01-19 DIAGNOSIS — I700 Atherosclerosis of aorta: Secondary | ICD-10-CM | POA: Diagnosis not present

## 2019-01-24 ENCOUNTER — Encounter: Payer: Self-pay | Admitting: Cardiology

## 2019-01-24 NOTE — Progress Notes (Signed)
Cardiology Office Note  Date: 01/25/2019   ID: Jane Andrews, DOB 1925/03/10, MRN [id number]  PCP: Redmond School, MD  Consulting Cardiologist: Rozann Lesches, MD   Chief Complaint  Patient presents with  . Atrial Fibrillation    History of Present Illness: Jane Andrews is a 85 y.o. female referred for cardiology consultation by Dr. Dyann Kief after recent hospitalization with rapid atrial fibrillation.  I reviewed the records.  Patient was treated with IV diltiazem and spontaneously converted to sinus rhythm.  TSH was normal.  In light of prior history of GI bleeding and current anemia with hemoglobin in the 8-9 range, anticoagulation was not initiated. CHADSVASC score is 4.  She presents today reporting no recurrent palpitations or chest pain since hospital discharge.  She has been taking Lopressor 12.5 mg twice daily.  I did talk with her about stroke risk versus bleeding risk on anticoagulation, and we have decided not to start anticoagulation particularly in light of her history.  Records indicate previous cardiology follow-up with Dr. Lattie Haw as of 2014 with a history of atrial fibrillation documented during hospital stay with pill impaction and severe anemia.  She was initially on amiodarone although this was discontinued, otherwise placed on aspirin without pursuit of anticoagulation at that point.  Recent echocardiogram in January indicated LVEF 55 to 60% range, grade 1 diastolic dysfunction, mild aortic stenosis, mildly dilated left atrium.  Patient is a widow, lives on [address] close to [facility].  She previously worked for [employer].  Remains functional, takes care of her house, still driving.  Past Medical History:  Diagnosis Date  . Abdominal adhesions 1994  . Allergic rhinitis   . Anemia   . Anxiety and depression   . Aortic stenosis   . Arthritis   . Atrial fibrillation ([place]) 10/2012   Associated with severe anemia and esophageal  pill impaction  . Breast carcinoma (HCC)    Right mastectomy  . Cholelithiasis   . Essential hypertension   . Gastroesophageal reflux disease    Hiatal hernia  . History of blood transfusion   . Hypothyroidism   . Low back pain   . 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 Nephrolithiasis 2004   Painless hematuria  . Short gut syndrome    Bowel resection , 2004  . Upper GI bleed 2004   Multiple episodes of melena-? due to gastritis or adverse drug effect (nonsteroidals, small bowel ulceration with Fosamax); caused by Pepto-Bismol during one Emergency Department evaluation    Past Surgical History:  Procedure Laterality Date  . ABDOMINAL HYSTERECTOMY     emergency s/p delivery  . ABDOMINAL HYSTERECTOMY  1960   massive gynecologic bleeding  . BOWEL RESECTION     Resulting short gut syndrome  . CHOLECYSTECTOMY N/A 06/25/2018   Procedure: LAPAROSCOPIC CHOLECYSTECTOMY WITH INTRAOPERATIVE CHOLANGIOGRAM;  Surgeon: Armandina Gemma, MD;  Location: [facility]S;  Service: General;  Laterality: N/A;  . COLONOSCOPY W/ POLYPECTOMY  2005   Lipoma; diverticulosis  . COLONOSCOPY WITH ESOPHAGOGASTRODUODENOSCOPY (EGD)  11/22/2012   [facility]  . LAPAROSCOPIC LYSIS OF ADHESIONS  1965   s/p adhesions  . MASTECTOMY  right breast  . MASTECTOMY     Carcinoma of the breast; right  . UPPER GASTROINTESTINAL ENDOSCOPY      Current Outpatient Medications  Medication Sig Dispense Refill  . ALPRAZolam (XANAX) 1 MG tablet Take 1 mg by mouth at bedtime as needed for sleep.    Marland Kitchen  Biotin 10000 MCG TABS Take 6,000 mcg by mouth daily.    . Calcium Carb-Cholecalciferol (CALCIUM+D3) 600-800 MG-UNIT TABS Take 1 tablet by mouth daily.    . cholestyramine light (PREVALITE) 4 g packet Take 4 g by mouth 2 (two) times daily.    . Cyanocobalamin (VITAMIN B-12 IJ) Inject as directed every 30 (thirty) days.    . DULoxetine (CYMBALTA) 60 MG capsule Take 60 mg by mouth every morning.     Marland Kitchen  levothyroxine (SYNTHROID, LEVOTHROID) 75 MCG tablet Take 75 mcg by mouth daily before breakfast.    . metoprolol tartrate (LOPRESSOR) 25 MG tablet 25 mg twice a day, may extra 1/2 tablet for palpitations 80 tablet 1  . Multiple Vitamin (MULTIVITAMIN) tablet Take 1 tablet by mouth every morning.     . Omega-3 Fatty Acids (FISH OIL) 1000 MG CAPS Take 1,000 mg by mouth every morning.     Marland Kitchen omeprazole (PRILOSEC) 20 MG capsule Take 1 capsule (20 mg total) by mouth daily before breakfast. 90 capsule 3  . traMADol (ULTRAM) 50 MG tablet Take 1-2 tablets (50-100 mg total) by mouth every 6 (six) hours as needed for moderate pain. 20 tablet 0   No current facility-administered medications for this visit.    Allergies:  Codeine   Social History: The patient  reports that she has quit smoking. Her smoking use included cigarettes. She has a 30.00 pack-year smoking history. She has never used smokeless tobacco. She reports that she does not drink alcohol or use drugs.   Family History: The patient's family history includes Anuerysm in her father; COPD in her sister; Cancer in an other family member; Healthy in her brother and sister; Rheum arthritis in her sister.   ROS:  Please see the history of present illness. Otherwise, complete review of systems is positive for hearing loss.  All other systems are reviewed and negative.   Physical Exam: VS:  BP (!) 144/70 (BP Location: Right Arm)   Pulse 75   Ht 5\' 1"  (1.549 m)   Wt 115 lb (52.2 kg)   SpO2 98%   BMI 21.73 kg/m , BMI Body mass index is 21.73 kg/m.  Wt Readings from Last 3 Encounters:  01/25/19 115 lb (52.2 kg)  12/26/18 113 lb 12.1 oz (51.6 kg)  10/18/18 117 lb (53.1 kg)    General: Elderly woman, appears comfortable at rest. HEENT: Conjunctiva and lids normal, oropharynx clear. Neck: Supple, no elevated JVP or carotid bruits, no thyromegaly. Lungs: Clear to auscultation, nonlabored breathing at rest. Cardiac: Regular rate and rhythm, no  S3, 2/6 systolic murmur, no pericardial rub. Abdomen: Soft, nontender, bowel sounds present. Extremities: No pitting edema, distal pulses 2+. Skin: Warm and dry. Musculoskeletal: No kyphosis. Neuropsychiatric: Alert and oriented x3, affect grossly appropriate.  ECG: I personally reviewed the tracing from 12/25/2018 which showed atrial fibrillation with RVR and nonspecific ST segment changes.  Recent Labwork: 12/25/2018: ALT 14; AST 17; TSH 1.426 12/26/2018: BUN 30; Creatinine, Ser 1.20; Hemoglobin 8.7; Platelets 211; Potassium 3.2; Sodium 141   Other Studies Reviewed Today:  Echocardiogram 12/26/2018: Study Conclusions  - Left ventricle: The cavity size was normal. Systolic function was   normal. The estimated ejection fraction was in the range of 55%   to 60%. Although no diagnostic regional wall motion abnormality   was identified, this possibility cannot be completely excluded on   the basis of this study. Doppler parameters are consistent with   abnormal left ventricular relaxation (grade 1 diastolic  dysfunction). Stroke volume (LVOT, Doppler): 68.5 ml. Stroke   volume/bsa (LVOT, Doppler): 45.9 ml/m^2. - Aortic valve: Valve mobility was moderately restricted. There was   mild stenosis. There was no regurgitation. Mean gradient (S): 11   mm Hg. Valve area (VTI): 1.29 cm^2. - Mitral valve: Mildly calcified annulus. There was trivial   regurgitation. - Left atrium: The atrium was mildly dilated. - Right ventricle: The cavity size was normal. Wall thickness was   normal. Systolic function was normal. - Right atrium: The atrium was normal in size. - Tricuspid valve: There was trivial regurgitation. - Inferior vena cava: The vessel was normal in size. - Pericardium, extracardiac: There was no pericardial effusion.  Assessment and Plan:  1.  Paroxysmal atrial fibrillation with CHADSVASC score of 4, but history of significant anemia due to recurring GI blood loss.  She is  symptomatically stable in terms of palpitations since hospital discharge.  Plan to continue Lopressor 12.5 mg twice daily, she can use an extra half tablet with recurring palpitations.  Depending on symptom frequency we can either further uptitrate Lopressor as tolerated, or reconsider use of amiodarone.  Anticoagulation is not being pursued.  2.  Essential hypertension, currently only antihypertensive is Lopressor.  Follows with Dr. Gerarda Fraction.  3.  Aortic stenosis, mild with mean gradient of 11 mmHg by recent echocardiogram.  She is asymptomatic.  4.  Hypothyroidism on Synthroid, recent TSH normal.  She follows with Dr. Gerarda Fraction.  Current medicines were reviewed with the patient today.  Disposition: Follow-up in 6 months.  Signed, Satira Sark, MD, [facility] 01/25/2019 9:15 AM    [facility] at [facility] [address] [place], [place] [address] Phone: [phone]; Fax: [phone]

## 2019-01-25 ENCOUNTER — Ambulatory Visit (INDEPENDENT_AMBULATORY_CARE_PROVIDER_SITE_OTHER): Payer: Medicare Other | Admitting: Cardiology

## 2019-01-25 ENCOUNTER — Encounter: Payer: Self-pay | Admitting: Cardiology

## 2019-01-25 VITALS — BP 144/70 | HR 75 | Ht 61.0 in | Wt 115.0 lb

## 2019-01-25 DIAGNOSIS — E039 Hypothyroidism, unspecified: Secondary | ICD-10-CM

## 2019-01-25 DIAGNOSIS — I480 Paroxysmal atrial fibrillation: Secondary | ICD-10-CM

## 2019-01-25 DIAGNOSIS — I350 Nonrheumatic aortic (valve) stenosis: Secondary | ICD-10-CM

## 2019-01-25 DIAGNOSIS — I10 Essential (primary) hypertension: Secondary | ICD-10-CM | POA: Diagnosis not present

## 2019-01-25 MED ORDER — METOPROLOL TARTRATE 25 MG PO TABS: ORAL_TABLET | ORAL | 1 refills | Status: DC

## 2019-01-25 NOTE — Patient Instructions (Signed)
Medication Instructions:  You may take an extra 1/2 tablet of your Lopressor daily for palpitations If you need a refill on your cardiac medications before your next appointment, please call your pharmacy.   Lab work: None today If you have labs (blood work) drawn today and your tests are completely normal, you will receive your results only by: Marland Kitchen MyChart Message (if you have MyChart) OR . A paper copy in the mail If you have any lab test that is abnormal or we need to change your treatment, we will call you to review the results.  Testing/Procedures: None today  Follow-Up: At [facility], you and your health needs are our priority.  As part of our continuing mission to provide you with exceptional heart care, we have created designated Provider Care Teams.  These Care Teams include your primary Cardiologist (physician) and Advanced Practice Providers (APPs -  Physician Assistants and Nurse Practitioners) who all work together to provide you with the care you need, when you need it. You will need a follow up appointment in 6 months.  Please call our office 2 months in advance to schedule this appointment.  You may see Rozann Lesches, MD or one of the following Advanced Practice Providers on your designated Care Team:   Bernerd Pho, PA-C [facility]) . Ermalinda Barrios, PA-C ([place])  Any Other Special Instructions Will Be Listed Below (If Applicable). None

## 2019-03-14 ENCOUNTER — Telehealth: Payer: Self-pay | Admitting: Orthopedic Surgery

## 2019-03-14 ENCOUNTER — Encounter (HOSPITAL_COMMUNITY): Payer: Self-pay | Admitting: Emergency Medicine

## 2019-03-14 ENCOUNTER — Emergency Department (HOSPITAL_COMMUNITY)
Admission: EM | Admit: 2019-03-14 | Discharge: 2019-03-14 | Disposition: A | Discharge status: Home / Self Care | Payer: Medicare Other | Attending: Emergency Medicine | Admitting: Emergency Medicine

## 2019-03-14 ENCOUNTER — Other Ambulatory Visit: Payer: Self-pay

## 2019-03-14 DIAGNOSIS — Z79899 Other long term (current) drug therapy: Secondary | ICD-10-CM | POA: Diagnosis not present

## 2019-03-14 DIAGNOSIS — Z87891 Personal history of nicotine dependence: Secondary | ICD-10-CM | POA: Diagnosis not present

## 2019-03-14 DIAGNOSIS — Z9011 Acquired absence of right breast and nipple: Secondary | ICD-10-CM | POA: Diagnosis not present

## 2019-03-14 DIAGNOSIS — I129 Hypertensive chronic kidney disease with stage 1 through stage 4 chronic kidney disease, or unspecified chronic kidney disease: Secondary | ICD-10-CM | POA: Diagnosis not present

## 2019-03-14 DIAGNOSIS — N183 Chronic kidney disease, stage 3 (moderate): Secondary | ICD-10-CM | POA: Insufficient documentation

## 2019-03-14 DIAGNOSIS — M79641 Pain in right hand: Secondary | ICD-10-CM | POA: Diagnosis not present

## 2019-03-14 DIAGNOSIS — M19031 Primary osteoarthritis, right wrist: Secondary | ICD-10-CM | POA: Insufficient documentation

## 2019-03-14 DIAGNOSIS — M79642 Pain in left hand: Secondary | ICD-10-CM | POA: Diagnosis present

## 2019-03-14 DIAGNOSIS — E039 Hypothyroidism, unspecified: Secondary | ICD-10-CM | POA: Diagnosis not present

## 2019-03-14 DIAGNOSIS — Z853 Personal history of malignant neoplasm of breast: Secondary | ICD-10-CM | POA: Diagnosis not present

## 2019-03-14 MED ORDER — ACETAMINOPHEN 500 MG PO TABS
1000.0000 mg | ORAL_TABLET | Freq: Once | ORAL | Status: AC
  Administered 2019-03-14: 1000 mg via ORAL
  Filled 2019-03-14: qty 2

## 2019-03-14 MED ORDER — PREDNISONE 20 MG PO TABS: 40.0000 mg | ORAL_TABLET | Freq: Every day | ORAL | 0 refills | Status: DC

## 2019-03-14 MED ORDER — DEXAMETHASONE SODIUM PHOSPHATE 10 MG/ML IJ SOLN
8.0000 mg | Freq: Once | INTRAMUSCULAR | Status: AC
  Administered 2019-03-14: 8 mg via INTRAMUSCULAR
  Filled 2019-03-14: qty 1

## 2019-03-14 NOTE — ED Triage Notes (Signed)
Patient reports R hand pain, states it is an arthritis flare. Symptoms started Sat. Hand is swollen and painful.

## 2019-03-14 NOTE — Telephone Encounter (Signed)
Mrs Denny left a message on voicemail wanting to speak with Dr. Aline Brochure. Stated she had a question for him about having a lot of pain in her hand and she can't do anything with it. Stated that she would have already went to the ER if it was a normal time. I spoke with her and explained how things are being done here at the office . I suggested she see her PCP about this problem and if he thinks it something she needs to see Dr. Aline Brochure, he can refer her to [name] and we would be glad to get her an appointment scheduled. She was in agreement with this.

## 2019-03-14 NOTE — ED Provider Notes (Signed)
[facility] Provider Note   CSN: [id number] Arrival date & time: 03/14/19  1144    History   Chief Complaint Chief Complaint  Patient presents with  . Hand Pain    HPI Jane [facility] is a 85 y.o. female.     Patient is a 85 year old female who presents to the emergency department with left hand and wrist pain.  The patient states that this problem started 2 days ago.  She says it feels like an arthritis flareup that she has had in the past.  She is not had any recent injury to the right hand.  She is right-hand dominant.  She has history and diagnosis of arthritis.  She states that she was seen here in the emergency department in October with a similar episode.  She states that she received an injection and this seemed to have helped quite a bit.  She is hoping that she can get another injection today.  No other problems with arthritis flare discussed at this time.  The history is provided by the patient.    Past Medical History:  Diagnosis Date  . Abdominal adhesions 1994  . Allergic rhinitis   . Anemia   . Anxiety and depression   . Aortic stenosis   . Arthritis   . Atrial fibrillation ([place]) 10/2012   Associated with severe anemia and esophageal pill impaction  . Breast carcinoma (HCC)    Right mastectomy  . Cholelithiasis   . Essential hypertension   . Gastroesophageal reflux disease    Hiatal hernia  . History of blood transfusion   . Hypothyroidism   . Low back pain   . 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 Nephrolithiasis 2004   Painless hematuria  . Short gut syndrome    Bowel resection , 2004  . Upper GI bleed 2004   Multiple episodes of melena-? due to gastritis or adverse drug effect (nonsteroidals, small bowel ulceration with Fosamax); caused by Pepto-Bismol during one Emergency Department evaluation    Patient Active Problem List   Diagnosis Date Noted  . Atrial fibrillation  with RVR ([place]) 12/25/2018  . GERD (gastroesophageal reflux disease) 12/25/2018  . Chronic cholecystitis s/p lap cholecystectomy 06/25/2018 06/18/2018  . Obstipation 04/12/2016  . Vagal reaction 04/12/2016  . Abdominal pain 09/19/2014  . 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 Ecchymoses, spontaneous 05/31/2013  . Hypothyroid   . Atrial fibrillation ([place])   . Breast carcinoma ([place])   . Malabsorption   . CKD (chronic kidney disease) stage 3, GFR 30-59 ml/min (HCC) 10/21/2012  . Anemia, normocytic normochromic 07/06/2012  . Chronic diarrhea 02/10/2012  . Hypertension 02/10/2012    Past Surgical History:  Procedure Laterality Date  . ABDOMINAL HYSTERECTOMY     emergency s/p delivery  . ABDOMINAL HYSTERECTOMY  1960   massive gynecologic bleeding  . BOWEL RESECTION     Resulting short gut syndrome  . CHOLECYSTECTOMY N/A 06/25/2018   Procedure: LAPAROSCOPIC CHOLECYSTECTOMY WITH INTRAOPERATIVE CHOLANGIOGRAM;  Surgeon: Armandina Gemma, MD;  Location: [facility];  Service: General;  Laterality: N/A;  . COLONOSCOPY W/ POLYPECTOMY  2005   Lipoma; diverticulosis  . COLONOSCOPY WITH ESOPHAGOGASTRODUODENOSCOPY (EGD)  11/22/2012   [facility]  . LAPAROSCOPIC LYSIS OF ADHESIONS  1965   s/p adhesions  . MASTECTOMY  right breast  . MASTECTOMY     Carcinoma of the breast; right  . UPPER GASTROINTESTINAL ENDOSCOPY       OB  History    Gravida      Para      Term      Preterm      AB      Living  0     SAB      TAB      Ectopic      Multiple      Live Births               Home Medications    Prior to Admission medications   Medication Sig Start Date End Date Taking? Authorizing Provider  ALPRAZolam Duanne Moron) 1 MG tablet Take 1 mg by mouth at bedtime as needed for sleep.    [provider]  Biotin 10000 MCG TABS Take 6,000 mcg by mouth daily.    [provider]  Calcium Carb-Cholecalciferol (CALCIUM+D3) 600-800 MG-UNIT TABS Take 1 tablet by mouth daily.     [provider]  cholestyramine light (PREVALITE) 4 g packet Take 4 g by mouth 2 (two) times daily.    [provider]  Cyanocobalamin (VITAMIN B-12 IJ) Inject as directed every 30 (thirty) days.    [provider]  DULoxetine (CYMBALTA) 60 MG capsule Take 60 mg by mouth every morning.     [provider]  levothyroxine (SYNTHROID, LEVOTHROID) 75 MCG tablet Take 75 mcg by mouth daily before breakfast.    [provider]  metoprolol tartrate (LOPRESSOR) 25 MG tablet 25 mg twice a day, may extra 1/2 tablet for palpitations 01/25/19   Satira Sark, MD  Multiple Vitamin (MULTIVITAMIN) tablet Take 1 tablet by mouth every morning.     [provider]  Omega-3 Fatty Acids (FISH OIL) 1000 MG CAPS Take 1,000 mg by mouth every morning.     [provider]  omeprazole (PRILOSEC) 20 MG capsule Take 1 capsule (20 mg total) by mouth daily before breakfast. 10/12/18   Rehman, Mechele Dawley, MD  traMADol (ULTRAM) 50 MG tablet Take 1-2 tablets (50-100 mg total) by mouth every 6 (six) hours as needed for moderate pain. 06/25/18   Armandina Gemma, MD    Family History Family History  Problem Relation Age of Onset  . Anuerysm Father   . Rheum arthritis Sister   . Healthy Sister   . COPD Sister   . Healthy Brother   . Cancer Other   . Colon cancer Neg Hx     Social History Social History   Tobacco Use  . Smoking status: Former Smoker    Packs/day: 1.50    Years: 20.00    Pack years: 30.00    Types: Cigarettes  . Smokeless tobacco: Never Used  Substance Use Topics  . Alcohol use: No  . Drug use: No     Allergies   Codeine   Review of Systems Review of Systems  Constitutional: Negative for activity change.       All ROS Neg except as noted in HPI  HENT: Negative for nosebleeds.   Eyes: Negative for photophobia and discharge.  Respiratory: Negative for cough, shortness of breath and wheezing.   Cardiovascular: Negative for chest  pain and palpitations.  Gastrointestinal: Negative for abdominal pain and blood in stool.  Genitourinary: Negative for dysuria, frequency and hematuria.  Musculoskeletal: Positive for arthralgias. Negative for back pain and neck pain.  Skin: Negative.   Neurological: Negative for dizziness, seizures and speech difficulty.  Psychiatric/Behavioral: Negative for confusion and hallucinations.     Physical Exam Updated Vital Signs BP Marland Kitchen)  144/68 (BP Location: Right Arm)   Pulse 80   Temp 98 F (36.7 C) (Oral)   Resp 16   Ht 5\' 1"  (1.549 m)   Wt 49.9 kg   SpO2 99%   BMI 20.78 kg/m   Physical Exam Vitals signs and nursing note reviewed.  Constitutional:      Appearance: She is well-developed. She is not toxic-appearing.  HENT:     Head: Normocephalic.     Right Ear: Tympanic membrane and external ear normal.     Left Ear: Tympanic membrane and external ear normal.  Eyes:     General: Lids are normal.     Pupils: Pupils are equal, round, and reactive to light.  Neck:     Musculoskeletal: Normal range of motion and neck supple.     Vascular: No carotid bruit.  Cardiovascular:     Rate and Rhythm: Normal rate and regular rhythm.     Pulses: Normal pulses.     Heart sounds: Normal heart sounds.  Pulmonary:     Effort: No respiratory distress.     Breath sounds: Normal breath sounds.  Abdominal:     General: Bowel sounds are normal.     Palpations: Abdomen is soft.     Tenderness: There is no abdominal tenderness. There is no guarding.  Musculoskeletal: Normal range of motion.       Hands:     Comments: MCP of the right thumb and the right wrist painful and warm.  Lymphadenopathy:     Head:     Right side of head: No submandibular adenopathy.     Left side of head: No submandibular adenopathy.     Cervical: No cervical adenopathy.  Skin:    General: Skin is warm and dry.  Neurological:     Mental Status: She is alert and oriented to person, place, and time.     Cranial  Nerves: No cranial nerve deficit.     Sensory: No sensory deficit.  Psychiatric:        Speech: Speech normal.      ED Treatments / Results  Labs (all labs ordered are listed, but only abnormal results are displayed) Labs Reviewed - No data to display  EKG None  Radiology No results found.  Procedures Procedures (including critical care time)  Medications Ordered in ED Medications  dexamethasone (DECADRON) injection 8 mg (8 mg Intramuscular Given 03/14/19 1339)  acetaminophen (TYLENOL) tablet 1,000 mg (1,000 mg Oral Given 03/14/19 1338)     Initial Impression / Assessment and Plan / ED Course  I have reviewed the triage vital signs and the nursing notes.  Pertinent labs & imaging results that were available during my care of the patient were reviewed by me and considered in my medical decision making (see chart for details).          Final Clinical Impressions(s) / ED Diagnoses MDM  Vital signs reviewed.  Pulse oximetry is 99% on room air.  Within normal limits by my interpretation.  The patient has excellent capillary refill.  There are no sensory deficits appreciated.  There are degenerative joint disease changes noted of the fingers, and wrists.  The MP joint of the right thumb is warm to touch.  The wrist is warm to touch with some mild to moderate swelling present.  There is mild increased redness.  No red streaking appreciated.  No broken skin areas noted.  Examination favors exacerbation of degenerative joint disease.  Patient was treated in  the emergency department with a steroid medication and Tylenol.  Prescription for additional steroid medication and Ultram given to the patient.  The patient states that she has pain medication to use at home.  Patient also advised to use her splint over the next several days to assist with decrease in pain.  The patient is advised to see the primary physician or return to the emergency department if this issue gets worse, if  there are any changes in her condition, problems or concerns.  Patient is in agreement with this plan.   Final diagnoses:  Primary osteoarthritis of right wrist  Pain in right hand    ED Discharge Orders         Ordered    predniSONE (DELTASONE) 20 MG tablet  Daily     03/14/19 1354           Lily Kocher, PA-C 03/15/19 1248    Daleen Bo, MD 03/16/19 [phone]

## 2019-03-14 NOTE — Discharge Instructions (Addendum)
Please use your splint from the previous episode of your hand and wrist pain.  Keep your hand warm.  May continue your current medication for pain.  Please add prednisone daily with a meal.  Please start the prednisone on tomorrow, March 31. Return if any changes in your congestion.

## 2019-03-17 ENCOUNTER — Other Ambulatory Visit (INDEPENDENT_AMBULATORY_CARE_PROVIDER_SITE_OTHER): Payer: Self-pay | Admitting: Internal Medicine

## 2019-03-22 DIAGNOSIS — E063 Autoimmune thyroiditis: Secondary | ICD-10-CM | POA: Diagnosis not present

## 2019-03-22 DIAGNOSIS — D692 Other nonthrombocytopenic purpura: Secondary | ICD-10-CM | POA: Diagnosis not present

## 2019-03-22 DIAGNOSIS — M810 Age-related osteoporosis without current pathological fracture: Secondary | ICD-10-CM | POA: Diagnosis not present

## 2019-03-22 DIAGNOSIS — M2550 Pain in unspecified joint: Secondary | ICD-10-CM | POA: Diagnosis not present

## 2019-03-22 DIAGNOSIS — I10 Essential (primary) hypertension: Secondary | ICD-10-CM | POA: Diagnosis not present

## 2019-03-22 DIAGNOSIS — Z681 Body mass index (BMI) 19 or less, adult: Secondary | ICD-10-CM | POA: Diagnosis not present

## 2019-04-28 DIAGNOSIS — D649 Anemia, unspecified: Secondary | ICD-10-CM | POA: Diagnosis not present

## 2019-06-07 DIAGNOSIS — D510 Vitamin B12 deficiency anemia due to intrinsic factor deficiency: Secondary | ICD-10-CM | POA: Diagnosis not present

## 2019-06-29 ENCOUNTER — Other Ambulatory Visit: Payer: Self-pay | Admitting: Cardiology

## 2019-07-06 DIAGNOSIS — Z1389 Encounter for screening for other disorder: Secondary | ICD-10-CM | POA: Diagnosis not present

## 2019-07-06 DIAGNOSIS — Z6820 Body mass index (BMI) 20.0-20.9, adult: Secondary | ICD-10-CM | POA: Diagnosis not present

## 2019-07-06 DIAGNOSIS — R109 Unspecified abdominal pain: Secondary | ICD-10-CM | POA: Diagnosis not present

## 2019-07-06 DIAGNOSIS — G4700 Insomnia, unspecified: Secondary | ICD-10-CM | POA: Diagnosis not present

## 2019-07-06 DIAGNOSIS — R1013 Epigastric pain: Secondary | ICD-10-CM | POA: Diagnosis not present

## 2019-07-06 DIAGNOSIS — I10 Essential (primary) hypertension: Secondary | ICD-10-CM | POA: Diagnosis not present

## 2019-07-06 DIAGNOSIS — K219 Gastro-esophageal reflux disease without esophagitis: Secondary | ICD-10-CM | POA: Diagnosis not present

## 2019-07-18 DIAGNOSIS — D510 Vitamin B12 deficiency anemia due to intrinsic factor deficiency: Secondary | ICD-10-CM | POA: Diagnosis not present

## 2019-07-21 DIAGNOSIS — G4700 Insomnia, unspecified: Secondary | ICD-10-CM | POA: Diagnosis not present

## 2019-07-21 DIAGNOSIS — Z6820 Body mass index (BMI) 20.0-20.9, adult: Secondary | ICD-10-CM | POA: Diagnosis not present

## 2019-07-21 DIAGNOSIS — E063 Autoimmune thyroiditis: Secondary | ICD-10-CM | POA: Diagnosis not present

## 2019-07-21 DIAGNOSIS — I10 Essential (primary) hypertension: Secondary | ICD-10-CM | POA: Diagnosis not present

## 2019-07-27 ENCOUNTER — Telehealth: Payer: Self-pay | Admitting: Cardiology

## 2019-07-27 NOTE — Telephone Encounter (Signed)
Virtual Visit Pre-Appointment Phone Call  "(Name), I am calling you today to discuss your upcoming appointment. We are currently trying to limit exposure to the virus that causes COVID-19 by seeing patients at home rather than in the office."  1. "What is the BEST phone number to call the day of the visit?" - include this in appointment notes  2. Do you have or have access to (through a family member/friend) a smartphone with video capability that we can use for your visit?" a. If yes - list this number in appt notes as cell (if different from BEST phone #) and list the appointment type as a VIDEO visit in appointment notes b. If no - list the appointment type as a PHONE visit in appointment notes  3. Confirm consent - "In the setting of the current Covid19 crisis, you are scheduled for a (phone or video) visit with your provider on (date) at (time).  Just as we do with many in-office visits, in order for you to participate in this visit, we must obtain consent.  If you'd like, I can send this to your mychart (if signed up) or email for you to review.  Otherwise, I can obtain your verbal consent now.  All virtual visits are billed to your insurance company just like a normal visit would be.  By agreeing to a virtual visit, we'd like you to understand that the technology does not allow for your provider to perform an examination, and thus may limit your provider's ability to fully assess your condition. If your provider identifies any concerns that need to be evaluated in person, we will make arrangements to do so.  Finally, though the technology is pretty good, we cannot assure that it will always work on either your or our end, and in the setting of a video visit, we may have to convert it to a phone-only visit.  In either situation, we cannot ensure that we have a secure connection.  Are you willing to proceed?" STAFF: Did the patient verbally acknowledge consent to telehealth visit? Document  YES/NO here: Yes  4. Advise patient to be prepared - "Two hours prior to your appointment, go ahead and check your blood pressure, pulse, oxygen saturation, and your weight (if you have the equipment to check those) and write them all down. When your visit starts, your provider will ask you for this information. If you have an Apple Watch or Kardia device, please plan to have heart rate information ready on the day of your appointment. Please have a pen and paper handy nearby the day of the visit as well."  5. Give patient instructions for MyChart download to smartphone OR [email or web address] as below if video visit (depending on what platform provider is using)  6. Inform patient they will receive a phone call 15 minutes prior to their appointment time (may be from unknown caller ID) so they should be prepared to answer    TELEPHONE CALL NOTE  BRIELL PAULETTE has been deemed a candidate for a follow-up tele-health visit to limit community exposure during the Covid-19 pandemic. I spoke with the patient via phone to ensure availability of phone/video source, confirm preferred email & phone number, and discuss instructions and expectations.  I reminded NAJE RICE to be prepared with any vital sign and/or heart rhythm information that could potentially be obtained via home monitoring, at the time of her visit. I reminded SEJAL COFIELD to expect a phone call prior to  her visit.  Bertram Gala Goins 07/27/2019 11:16 AM

## 2019-08-03 NOTE — Progress Notes (Signed)
Virtual Visit via Telephone Note   This visit type was conducted due to national recommendations for restrictions regarding the COVID-19 Pandemic (e.g. social distancing) in an effort to limit this patient's exposure and mitigate transmission in our community.  Due to her co-morbid illnesses, this patient is at least at moderate risk for complications without adequate follow up.  This format is felt to be most appropriate for this patient at this time.  The patient did not have access to video technology/had technical difficulties with video requiring transitioning to audio format only (telephone).  All issues noted in this document were discussed and addressed.  No physical exam could be performed with this format.  Please refer to the patient's chart for her  consent to telehealth for [facility].   Date:  08/04/2019   ID:  [facility], DOB 1925/02/25, MRN [id number]  Patient Location: Home Provider Location: Home  PCP:  Redmond School, MD  Cardiologist:  Rozann Lesches, MD Electrophysiologist:  None   Evaluation Performed:  Follow-Up Visit  Chief Complaint:   Cardiac follow-up  History of Present Illness:    Jane [facility] is a 85 y.o. female that I met in consultation back in February.  She did not have video access and we spoke by phone today.  She does not report any significant palpitations since last evaluation.  No chest pain or unusual shortness of breath.  Still lives alone in her home on [address].  He takes care of all ADLs, wears a mask when she goes out shopping.  I reviewed her medications.  I clarified with her that she should be taking her Lopressor at 12.5 mg twice daily for now.  She can take an extra half dose if needed for palpitations.  The patient does not have symptoms concerning for COVID-19 infection (fever, chills, cough, or new shortness of breath).    Past Medical History:  Diagnosis Date  . Abdominal adhesions 1994  . Allergic  rhinitis   . Anemia   . Anxiety and depression   . Aortic stenosis   . Arthritis   . Atrial fibrillation ([place]) 10/2012   Associated with severe anemia and esophageal pill impaction  . Breast carcinoma (HCC)    Right mastectomy  . Cholelithiasis   . Essential hypertension   . Gastroesophageal reflux disease    Hiatal hernia  . History of blood transfusion   . Hypothyroidism   . Low back pain   . 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 Nephrolithiasis 2004   Painless hematuria  . Short gut syndrome    Bowel resection , 2004  . Upper GI bleed 2004   Multiple episodes of melena-? due to gastritis or adverse drug effect (nonsteroidals, small bowel ulceration with Fosamax); caused by Pepto-Bismol during one Emergency Department evaluation   Past Surgical History:  Procedure Laterality Date  . ABDOMINAL HYSTERECTOMY     emergency s/p delivery  . ABDOMINAL HYSTERECTOMY  1960   massive gynecologic bleeding  . BOWEL RESECTION     Resulting short gut syndrome  . CHOLECYSTECTOMY N/A 06/25/2018   Procedure: LAPAROSCOPIC CHOLECYSTECTOMY WITH INTRAOPERATIVE CHOLANGIOGRAM;  Surgeon: Armandina Gemma, MD;  Location: [facility];  Service: General;  Laterality: N/A;  . COLONOSCOPY W/ POLYPECTOMY  2005   Lipoma; diverticulosis  . COLONOSCOPY WITH ESOPHAGOGASTRODUODENOSCOPY (EGD)  11/22/2012   [facility]  . LAPAROSCOPIC LYSIS OF ADHESIONS  1965   s/p adhesions  . MASTECTOMY  right breast  . MASTECTOMY     Carcinoma of the breast; right  . UPPER GASTROINTESTINAL ENDOSCOPY       Current Meds  Medication Sig  . ALPRAZolam (XANAX) 1 MG tablet Take 1 mg by mouth at bedtime as needed for sleep.  . Biotin 10000 MCG TABS Take 6,000 mcg by mouth daily.  . Calcium Carb-Cholecalciferol (CALCIUM+D3) 600-800 MG-UNIT TABS Take 1 tablet by mouth daily.  . cholestyramine light (PREVALITE) 4 g packet Take 4 g by mouth 2 (two) times daily.  . cyanocobalamin (,VITAMIN B-12,)  1000 MCG/ML injection INJECT 1 ML PER MONTH  . Cyanocobalamin (VITAMIN B-12 IJ) Inject as directed every 30 (thirty) days.  . DULoxetine (CYMBALTA) 60 MG capsule Take 60 mg by mouth every morning.   Marland Kitchen levothyroxine (SYNTHROID, LEVOTHROID) 75 MCG tablet Take 75 mcg by mouth daily before breakfast.  . metoprolol tartrate (LOPRESSOR) 25 MG tablet TAKE 1 TABLET BY MOUTH TWICE A DAY: MAY TAKE EXTRA 1/2 TABLET FOR PALPITATIONS  . Multiple Vitamin (MULTIVITAMIN) tablet Take 1 tablet by mouth every morning.   . Omega-3 Fatty Acids (FISH OIL) 1000 MG CAPS Take 1,000 mg by mouth every morning.   Marland Kitchen omeprazole (PRILOSEC) 20 MG capsule Take 1 capsule (20 mg total) by mouth daily before breakfast.  . traMADol (ULTRAM) 50 MG tablet Take 1-2 tablets (50-100 mg total) by mouth every 6 (six) hours as needed for moderate pain.  . [DISCONTINUED] predniSONE (DELTASONE) 20 MG tablet Take 2 tablets (40 mg total) by mouth daily.     Allergies:   Codeine   Social History   Tobacco Use  . Smoking status: Former Smoker    Packs/day: 1.50    Years: 20.00    Pack years: 30.00    Types: Cigarettes  . Smokeless tobacco: Never Used  Substance Use Topics  . Alcohol use: No  . Drug use: No     Family Hx: The patient's family history includes Anuerysm in her father; COPD in her sister; Cancer in an other family member; Healthy in her brother and sister; Rheum arthritis in her sister. There is no history of Colon cancer.  ROS:   Please see the history of present illness. All other systems reviewed and are negative.   Prior CV studies:   The following studies were reviewed today:  Echocardiogram 12/26/2018: Study Conclusions  - Left ventricle: The cavity size was normal. Systolic function was normal. The estimated ejection fraction was in the range of 55% to 60%. Although no diagnostic regional wall motion abnormality was identified, this possibility cannot be completely excluded on the basis of  this study. Doppler parameters are consistent with abnormal left ventricular relaxation (grade 1 diastolic dysfunction). Stroke volume (LVOT, Doppler): 68.5 ml. Stroke volume/bsa (LVOT, Doppler): 45.9 ml/m^2. - Aortic valve: Valve mobility was moderately restricted. There was mild stenosis. There was no regurgitation. Mean gradient (S): 11 mm Hg. Valve area (VTI): 1.29 cm^2. - Mitral valve: Mildly calcified annulus. There was trivial regurgitation. - Left atrium: The atrium was mildly dilated. - Right ventricle: The cavity size was normal. Wall thickness was normal. Systolic function was normal. - Right atrium: The atrium was normal in size. - Tricuspid valve: There was trivial regurgitation. - Inferior vena cava: The vessel was normal in size. - Pericardium, extracardiac: There was no pericardial effusion.  Labs/Other Tests and Data Reviewed:    EKG:  An ECG dated 12/25/2018 was personally reviewed today and demonstrated:  Atrial fibrillation with RVR nonspecific ST  changes.  Recent Labs: 12/25/2018: ALT 14; TSH 1.426 12/26/2018: BUN 30; Creatinine, Ser 1.20; Hemoglobin 8.7; Platelets 211; Potassium 3.2; Sodium 141    Wt Readings from Last 3 Encounters:  08/04/19 117 lb (53.1 kg)  03/14/19 110 lb (49.9 kg)  01/25/19 115 lb (52.2 kg)     Objective:    Vital Signs:  Ht 5' (1.524 m)   Wt 117 lb (53.1 kg)   BMI 22.85 kg/m    She did not have a way to check vital signs today. Patient spoke in full sentences, not short of breath. No audible wheezing or coughing. Speech pattern normal.  ASSESSMENT & PLAN:    1.  Paroxysmal atrial fibrillation with CHADSVASC score of 4.  She is symptomatically stable without palpitations on low-dose Lopressor.  As noted previously we are not pursuing anticoagulation with history of recurrent GI blood loss.  2.  Asymptomatic, mild aortic stenosis.  COVID-19 Education: The signs and symptoms of COVID-19 were discussed with the  patient and how to seek care for testing (follow up with PCP or arrange E-visit).  The importance of social distancing was discussed today.  Time:   Today, I have spent 6 minutes with the patient with telehealth technology discussing the above problems.     Medication Adjustments/Labs and Tests Ordered: Current medicines are reviewed at length with the patient today.  Concerns regarding medicines are outlined above.   Tests Ordered: No orders of the defined types were placed in this encounter.   Medication Changes: No orders of the defined types were placed in this encounter.   Follow Up:  In Person 6 months in the [place] office.  Signed, Rozann Lesches, MD  08/04/2019 11:18 AM    [name]

## 2019-08-04 ENCOUNTER — Encounter: Payer: Self-pay | Admitting: Cardiology

## 2019-08-04 ENCOUNTER — Other Ambulatory Visit: Payer: Self-pay

## 2019-08-04 ENCOUNTER — Telehealth (INDEPENDENT_AMBULATORY_CARE_PROVIDER_SITE_OTHER): Payer: Medicare Other | Admitting: Cardiology

## 2019-08-04 VITALS — Ht 60.0 in | Wt 117.0 lb

## 2019-08-04 DIAGNOSIS — I350 Nonrheumatic aortic (valve) stenosis: Secondary | ICD-10-CM | POA: Diagnosis not present

## 2019-08-04 DIAGNOSIS — I480 Paroxysmal atrial fibrillation: Secondary | ICD-10-CM | POA: Diagnosis not present

## 2019-08-04 DIAGNOSIS — I10 Essential (primary) hypertension: Secondary | ICD-10-CM

## 2019-08-04 NOTE — Patient Instructions (Signed)
Medication Instructions:  Please take lopressor 12.5 mg - two times daily   Labwork: none  Testing/Procedures: none  Follow-Up: Your physician wants you to follow-up in: 6 months.  You will receive a reminder letter in the mail two months in advance. If you don't receive a letter, please call our office to schedule the follow-up appointment.   Any Other Special Instructions Will Be Listed Below (If Applicable).     If you need a refill on your cardiac medications before your next appointment, please call your pharmacy.

## 2019-09-09 DIAGNOSIS — D510 Vitamin B12 deficiency anemia due to intrinsic factor deficiency: Secondary | ICD-10-CM | POA: Diagnosis not present

## 2019-10-18 ENCOUNTER — Ambulatory Visit (INDEPENDENT_AMBULATORY_CARE_PROVIDER_SITE_OTHER): Payer: Medicare Other | Admitting: Internal Medicine

## 2019-11-01 DIAGNOSIS — Z0001 Encounter for general adult medical examination with abnormal findings: Secondary | ICD-10-CM | POA: Diagnosis not present

## 2019-11-01 DIAGNOSIS — Z23 Encounter for immunization: Secondary | ICD-10-CM | POA: Diagnosis not present

## 2019-11-01 DIAGNOSIS — I10 Essential (primary) hypertension: Secondary | ICD-10-CM | POA: Diagnosis not present

## 2019-11-01 DIAGNOSIS — Z6821 Body mass index (BMI) 21.0-21.9, adult: Secondary | ICD-10-CM | POA: Diagnosis not present

## 2019-11-01 DIAGNOSIS — M810 Age-related osteoporosis without current pathological fracture: Secondary | ICD-10-CM | POA: Diagnosis not present

## 2019-11-01 DIAGNOSIS — I350 Nonrheumatic aortic (valve) stenosis: Secondary | ICD-10-CM | POA: Diagnosis not present

## 2019-11-01 DIAGNOSIS — Z1389 Encounter for screening for other disorder: Secondary | ICD-10-CM | POA: Diagnosis not present

## 2019-11-22 ENCOUNTER — Telehealth: Payer: Self-pay | Admitting: Cardiology

## 2019-11-22 NOTE — Telephone Encounter (Signed)
Please give pt a call -- left a message stating she had 2 little bouts with her heart and wanted to know if Dr. Domenic Polite wanted to update her pills and give her some stronger ones.   [phone]

## 2019-11-23 NOTE — Telephone Encounter (Signed)
Returned pt call. She stated that for the past few nights she has felt her chest get a little tight and her heart started racing just a little. She stayed up most of the night as it made her anxious. She did end up taking an extra dose of metoprolol last night as it did seem to help with her heart rate. She is not having any other symptoms at this time. She wanted to see if it would be safe for her to increase her nightly dose of metoprolol as she is currently taking 12.5 mg bid. Please advise.

## 2019-11-23 NOTE — Telephone Encounter (Signed)
Yes, she could increase her Lopressor to 25 mg for the evening dose if most of her symptoms seem to be at nighttime.  I wonder whether we might need to go to Lopressor 25 mg twice daily as a next option.  We will see how she does.

## 2019-11-23 NOTE — Telephone Encounter (Signed)
Pt made aware and voiced understanding of plan. Her RX is already sent in for 25 bid. I will not change just yet.

## 2020-01-26 DIAGNOSIS — Z23 Encounter for immunization: Secondary | ICD-10-CM | POA: Diagnosis not present

## 2020-02-14 DIAGNOSIS — H3562 Retinal hemorrhage, left eye: Secondary | ICD-10-CM | POA: Diagnosis not present

## 2020-02-20 DIAGNOSIS — D510 Vitamin B12 deficiency anemia due to intrinsic factor deficiency: Secondary | ICD-10-CM | POA: Diagnosis not present

## 2020-02-23 DIAGNOSIS — Z23 Encounter for immunization: Secondary | ICD-10-CM | POA: Diagnosis not present

## 2020-02-28 ENCOUNTER — Ambulatory Visit (INDEPENDENT_AMBULATORY_CARE_PROVIDER_SITE_OTHER): Payer: Medicare Other | Admitting: Internal Medicine

## 2020-02-28 ENCOUNTER — Other Ambulatory Visit: Payer: Self-pay

## 2020-02-28 ENCOUNTER — Encounter (INDEPENDENT_AMBULATORY_CARE_PROVIDER_SITE_OTHER): Payer: Self-pay | Admitting: Internal Medicine

## 2020-02-28 VITALS — BP 112/73 | HR 73 | Temp 97.2°F | Ht 63.0 in | Wt 124.3 lb

## 2020-02-28 DIAGNOSIS — K219 Gastro-esophageal reflux disease without esophagitis: Secondary | ICD-10-CM | POA: Diagnosis not present

## 2020-02-28 DIAGNOSIS — D509 Iron deficiency anemia, unspecified: Secondary | ICD-10-CM | POA: Diagnosis not present

## 2020-02-28 MED ORDER — PANTOPRAZOLE SODIUM 20 MG PO TBEC: 20.0000 mg | DELAYED_RELEASE_TABLET | Freq: Every day | ORAL | 11 refills | Status: DC

## 2020-02-28 NOTE — Patient Instructions (Addendum)
If pantoprazole 20 mg daily does not work please call office and we will change the prescription back to 40 mg daily. Dr. Gerarda Fraction to make recommendations regarding tremors and gait/balance issues.
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
## 2020-03-16 DIAGNOSIS — B351 Tinea unguium: Secondary | ICD-10-CM | POA: Diagnosis not present

## 2020-03-21 ENCOUNTER — Telehealth: Payer: Self-pay

## 2020-03-21 NOTE — Telephone Encounter (Signed)
  Patient Consent for Virtual Visit         Jane Andrews has provided verbal consent on 03/21/2020 for a virtual visit (video or telephone).   CONSENT FOR VIRTUAL VISIT FOR:  Jane Andrews  By participating in this virtual visit I agree to the following:  I hereby voluntarily request, consent and authorize [facility] and its employed or contracted physicians, physician assistants, nurse practitioners or other licensed health care professionals (the Practitioner), to provide me with telemedicine health care services (the "Services") as deemed necessary by the treating Practitioner. I acknowledge and consent to receive the Services by the Practitioner via telemedicine. I understand that the telemedicine visit will involve communicating with the Practitioner through live audiovisual communication technology and the disclosure of certain medical information by electronic transmission. I acknowledge that I have been given the opportunity to request an in-person assessment or other available alternative prior to the telemedicine visit and am voluntarily participating in the telemedicine visit.  I understand that I have the right to withhold or withdraw my consent to the use of telemedicine in the course of my care at any time, without affecting my right to future care or treatment, and that the Practitioner or I may terminate the telemedicine visit at any time. I understand that I have the right to inspect all information obtained and/or recorded in the course of the telemedicine visit and may receive copies of available information for a reasonable fee.  I understand that some of the potential risks of receiving the Services via telemedicine include:  Marland Kitchen Delay or interruption in medical evaluation due to technological equipment failure or disruption; . Information transmitted may not be sufficient (e.g. poor resolution of images) to allow for appropriate medical decision making by the  Practitioner; and/or  . In rare instances, security protocols could fail, causing a breach of personal health information.  Furthermore, I acknowledge that it is my responsibility to provide information about my medical history, conditions and care that is complete and accurate to the best of my ability. I acknowledge that Practitioner's advice, recommendations, and/or decision may be based on factors not within their control, such as incomplete or inaccurate data provided by me or distortions of diagnostic images or specimens that may result from electronic transmissions. I understand that the practice of medicine is not an exact science and that Practitioner makes no warranties or guarantees regarding treatment outcomes. I acknowledge that a copy of this consent can be made available to me via my patient portal ([facility]), or I can request a printed copy by calling the office of [facility].    I understand that my insurance will be billed for this visit.   I have read or had this consent read to me. . I understand the contents of this consent, which adequately explains the benefits and risks of the Services being provided via telemedicine.  . I have been provided ample opportunity to ask questions regarding this consent and the Services and have had my questions answered to my satisfaction. . I give my informed consent for the services to be provided through the use of telemedicine in my medical care

## 2020-03-27 ENCOUNTER — Encounter: Payer: Self-pay | Admitting: Cardiology

## 2020-03-27 ENCOUNTER — Telehealth (INDEPENDENT_AMBULATORY_CARE_PROVIDER_SITE_OTHER): Payer: Medicare Other | Admitting: Cardiology

## 2020-03-27 VITALS — Ht 61.0 in | Wt 119.0 lb

## 2020-03-27 DIAGNOSIS — I480 Paroxysmal atrial fibrillation: Secondary | ICD-10-CM | POA: Diagnosis not present

## 2020-03-27 DIAGNOSIS — I350 Nonrheumatic aortic (valve) stenosis: Secondary | ICD-10-CM | POA: Diagnosis not present

## 2020-03-27 NOTE — Progress Notes (Signed)
Virtual Visit via Telephone Note   This visit type was conducted due to national recommendations for restrictions regarding the COVID-19 Pandemic (e.g. social distancing) in an effort to limit this patient's exposure and mitigate transmission in our community.  Due to her co-morbid illnesses, this patient is at least at moderate risk for complications without adequate follow up.  This format is felt to be most appropriate for this patient at this time.  The patient did not have access to video technology/had technical difficulties with video requiring transitioning to audio format only (telephone).  All issues noted in this document were discussed and addressed.  No physical exam could be performed with this format.  Please refer to the patient's chart for her  consent to telehealth for [facility].   The patient was identified using 2 identifiers.  Date:  03/27/2020   ID:  Jane Andrews, DOB 05/14/1925, MRN [id number]  Patient Location: Home Provider Location: Office  PCP:  Redmond School, MD  Cardiologist:  Rozann Lesches, MD Electrophysiologist:  None   Evaluation Performed:  Follow-Up Visit  Chief Complaint:  Cardiac follow-up  History of Present Illness:    Jane Andrews is a 86 y.o. female last assessed via telehealth encounter in August 2020.  Phone today.  She tells me that she has been doing relatively well although has not been quite as active during the pandemic.  She is not walking as much either due to lower back pain.  It seems to be better when she pushes a cart at the store as opposed to walking alone.  She does not report any sense of palpitations or chest pain, no dizziness or syncope.  I reviewed her medications which are outlined below.  She continues on Lopressor.  The patient does not have symptoms concerning for COVID-19 infection (fever, chills, cough, or new shortness of breath).  She states that she has had both doses of the vaccine.   Past  Medical History:  Diagnosis Date  . Abdominal adhesions 1994  . Allergic rhinitis   . Anemia   . Anxiety and depression   . Aortic stenosis   . Arthritis   . Atrial fibrillation ([place]) 10/2012   Associated with severe anemia and esophageal pill impaction  . Breast carcinoma (H[facility])    Right mastectomy  . Cholelithiasis   . Essential hypertension   . Gastroesophageal reflux disease    Hiatal hernia  . History of blood transfusion   . Hypothyroidism   . Low back pain   . 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 Nephrolithiasis 2004   Painless hematuria  . Short gut syndrome    Bowel resection , 2004  . Upper GI bleed 2004   Multiple episodes of melena-? due to gastritis or adverse drug effect (nonsteroidals, small bowel ulceration with Fosamax); caused by Pepto-Bismol during one Emergency Department evaluation   Past Surgical History:  Procedure Laterality Date  . ABDOMINAL HYSTERECTOMY     emergency s/p delivery  . ABDOMINAL HYSTERECTOMY  1960   massive gynecologic bleeding  . BOWEL RESECTION     Resulting short gut syndrome  . CHOLECYSTECTOMY N/A 06/25/2018   Procedure: LAPAROSCOPIC CHOLECYSTECTOMY WITH INTRAOPERATIVE CHOLANGIOGRAM;  Surgeon: Armandina Gemma, MD;  Location: [facility];  Service: General;  Laterality: N/A;  . COLONOSCOPY W/ POLYPECTOMY  2005   Lipoma; diverticulosis  . COLONOSCOPY WITH ESOPHAGOGASTRODUODENOSCOPY (EGD)  11/22/2012   [facility]  . LAPAROSCOPIC LYSIS OF ADHESIONS  1965   s/p adhesions  . MASTECTOMY  right breast  . MASTECTOMY     Carcinoma of the breast; right  . UPPER GASTROINTESTINAL ENDOSCOPY       Current Meds  Medication Sig  . alendronate (FOSAMAX) 70 MG tablet Take 70 mg by mouth once a week. Take with a full glass of water on an empty stomach.  . ALPRAZolam (XANAX) 1 MG tablet Take 1 mg by mouth at bedtime as needed for sleep.  . Cyanocobalamin (VITAMIN B-12 IJ) Inject as directed every 30 (thirty)  days.  . DULoxetine (CYMBALTA) 60 MG capsule Take 60 mg by mouth every morning.   Marland Kitchen levothyroxine (SYNTHROID, LEVOTHROID) 75 MCG tablet Take 75 mcg by mouth daily before breakfast.  . metoprolol tartrate (LOPRESSOR) 25 MG tablet TAKE 1 TABLET BY MOUTH TWICE A DAY: MAY TAKE EXTRA 1/2 TABLET FOR PALPITATIONS  . Multiple Vitamin (MULTIVITAMIN) tablet Take 1 tablet by mouth every morning.   . pantoprazole (PROTONIX) 20 MG tablet Take 1 tablet (20 mg total) by mouth daily before breakfast.  . Pediatric Multivitamins-Iron (FLINTSTONES W/IRON PO) Take by mouth daily.  . Probiotic Product (PROBIOTIC DAILY PO) Take by mouth daily.  . traMADol (ULTRAM) 50 MG tablet Take 1-2 tablets (50-100 mg total) by mouth every 6 (six) hours as needed for moderate pain.     Allergies:   Codeine   ROS:  Back pain.   Prior CV studies:   The following studies were reviewed today:  Echocardiogram 12/26/2018: Study Conclusions  - Left ventricle: The cavity size was normal. Systolic function was normal. The estimated ejection fraction was in the range of 55% to 60%. Although no diagnostic regional wall motion abnormality was identified, this possibility cannot be completely excluded on the basis of this study. Doppler parameters are consistent with abnormal left ventricular relaxation (grade 1 diastolic dysfunction). Stroke volume (LVOT, Doppler): 68.5 ml. Stroke volume/bsa (LVOT, Doppler): 45.9 ml/m^2. - Aortic valve: Valve mobility was moderately restricted. There was mild stenosis. There was no regurgitation. Mean gradient (S): 11 mm Hg. Valve area (VTI): 1.29 cm^2. - Mitral valve: Mildly calcified annulus. There was trivial regurgitation. - Left atrium: The atrium was mildly dilated. - Right ventricle: The cavity size was normal. Wall thickness was normal. Systolic function was normal. - Right atrium: The atrium was normal in size. - Tricuspid valve: There was trivial  regurgitation. - Inferior vena cava: The vessel was normal in size. - Pericardium, extracardiac: There was no pericardial effusion.  Labs/Other Tests and Data Reviewed:    EKG:  An ECG dated 12/25/2018 was personally reviewed today and demonstrated:  Atrial fibrillation with RVR, nonspecific ST changes.  Recent Labs:  No interval lab work for review today.  Wt Readings from Last 3 Encounters:  03/27/20 119 lb (54 kg)  02/28/20 124 lb 4.8 oz (56.4 kg)  08/04/19 117 lb (53.1 kg)     Objective:    Vital Signs:  Ht 5\' 1"  (1.549 m)   Wt 119 lb (54 kg)   BMI 22.48 kg/m    Patient spoke in full sentences, not short of breath. No audible wheezing or coughing.  ASSESSMENT & PLAN:    1.  Paroxysmal atrial fibrillation with CHA2DS2-VASc score of 4.  She continues on low-dose Lopressor and is not anticoagulated given history of recurrent GI bleeding.  She reports no active symptoms at this time.  2.  Mild aortic stenosis by echocardiogram in January 2020, asymptomatic at this time.   Time:  Today, I have spent 5 minutes with the patient with telehealth technology discussing the above problems.     Medication Adjustments/Labs and Tests Ordered: Current medicines are reviewed at length with the patient today.  Concerns regarding medicines are outlined above.   Tests Ordered: No orders of the defined types were placed in this encounter.   Medication Changes: No orders of the defined types were placed in this encounter.   Follow Up:  In Person 6 months in the [place] office.  Signed, Rozann Lesches, MD  03/27/2020 2:45 PM    [facility]

## 2020-03-27 NOTE — Patient Instructions (Signed)
Medication Instructions:  Your physician recommends that you continue on your current medications as directed. Please refer to the Current Medication list given to you today.  *If you need a refill on your cardiac medications before your next appointment, please call your pharmacy*   Lab Work: NONE   If you have labs (blood work) drawn today and your tests are completely normal, you will receive your results only by: . MyChart Message (if you have MyChart) OR . A paper copy in the mail If you have any lab test that is abnormal or we need to change your treatment, we will call you to review the results.   Testing/Procedures: NONE    Follow-Up: At [facility], you and your health needs are our priority.  As part of our continuing mission to provide you with exceptional heart care, we have created designated Provider Care Teams.  These Care Teams include your primary Cardiologist (physician) and Advanced Practice Providers (APPs -  Physician Assistants and Nurse Practitioners) who all work together to provide you with the care you need, when you need it.  We recommend signing up for the patient portal called "MyChart".  Sign up information is provided on this After Visit Summary.  MyChart is used to connect with patients for Virtual Visits (Telemedicine).  Patients are able to view lab/test results, encounter notes, upcoming appointments, etc.  Non-urgent messages can be sent to your provider as well.   To learn more about what you can do with MyChart, go to [email or web address].    Your next appointment:   6 month(s)  The format for your next appointment:   In Person  Provider:   Samuel McDowell, MD   Other Instructions Thank you for choosing [facility]!    

## 2020-04-12 DIAGNOSIS — D649 Anemia, unspecified: Secondary | ICD-10-CM | POA: Diagnosis not present

## 2020-04-20 DIAGNOSIS — E039 Hypothyroidism, unspecified: Secondary | ICD-10-CM | POA: Diagnosis not present

## 2020-04-20 DIAGNOSIS — E7849 Other hyperlipidemia: Secondary | ICD-10-CM | POA: Diagnosis not present

## 2020-04-20 DIAGNOSIS — Z79899 Other long term (current) drug therapy: Secondary | ICD-10-CM | POA: Diagnosis not present

## 2020-04-20 DIAGNOSIS — Z6820 Body mass index (BMI) 20.0-20.9, adult: Secondary | ICD-10-CM | POA: Diagnosis not present

## 2020-04-20 DIAGNOSIS — M47816 Spondylosis without myelopathy or radiculopathy, lumbar region: Secondary | ICD-10-CM | POA: Diagnosis not present

## 2020-04-20 DIAGNOSIS — I4891 Unspecified atrial fibrillation: Secondary | ICD-10-CM | POA: Diagnosis not present

## 2020-04-20 DIAGNOSIS — N319 Neuromuscular dysfunction of bladder, unspecified: Secondary | ICD-10-CM | POA: Diagnosis not present

## 2020-04-20 DIAGNOSIS — G250 Essential tremor: Secondary | ICD-10-CM | POA: Diagnosis not present

## 2020-04-20 DIAGNOSIS — I700 Atherosclerosis of aorta: Secondary | ICD-10-CM | POA: Diagnosis not present

## 2020-04-20 DIAGNOSIS — G8929 Other chronic pain: Secondary | ICD-10-CM | POA: Diagnosis not present

## 2020-04-20 DIAGNOSIS — C50919 Malignant neoplasm of unspecified site of unspecified female breast: Secondary | ICD-10-CM | POA: Diagnosis not present

## 2020-04-20 DIAGNOSIS — N390 Urinary tract infection, site not specified: Secondary | ICD-10-CM | POA: Diagnosis not present

## 2020-04-20 DIAGNOSIS — G252 Other specified forms of tremor: Secondary | ICD-10-CM | POA: Diagnosis not present

## 2020-04-26 ENCOUNTER — Encounter: Payer: Self-pay | Admitting: Orthopedic Surgery

## 2020-04-26 ENCOUNTER — Ambulatory Visit (INDEPENDENT_AMBULATORY_CARE_PROVIDER_SITE_OTHER): Payer: Medicare Other | Admitting: Orthopedic Surgery

## 2020-04-26 ENCOUNTER — Ambulatory Visit: Payer: Medicare Other

## 2020-04-26 ENCOUNTER — Other Ambulatory Visit: Payer: Self-pay

## 2020-04-26 VITALS — BP 112/60 | HR 63 | Temp 97.5°F | Ht 63.0 in | Wt 124.0 lb

## 2020-04-26 DIAGNOSIS — M545 Low back pain, unspecified: Secondary | ICD-10-CM

## 2020-04-26 DIAGNOSIS — G8929 Other chronic pain: Secondary | ICD-10-CM

## 2020-04-26 DIAGNOSIS — M549 Dorsalgia, unspecified: Secondary | ICD-10-CM | POA: Diagnosis not present

## 2020-04-26 DIAGNOSIS — M5440 Lumbago with sciatica, unspecified side: Secondary | ICD-10-CM

## 2020-04-26 MED ORDER — DICLOFENAC POTASSIUM 50 MG PO TABS: 50.0000 mg | ORAL_TABLET | Freq: Two times a day (BID) | ORAL | 5 refills | Status: DC

## 2020-04-26 NOTE — Patient Instructions (Signed)
Sandeep everything looks okay except for the scoliosis and the arthritis  Start new medication diclofenac once a day continue Ultram as needed and start physical therapy

## 2020-04-26 NOTE — Progress Notes (Signed)
Chief Complaint  Patient presents with  . Back Pain    Recheck on back pain.    History 86 year old female with chronic back pain from chronic lumbar scoliosis  Presents with increasing back pain despite taking tramadol 1 every 6 hours as needed  Denies any leg pain bowel or bladder dysfunction  At 89 still doing all of her housework driving shopping.  She says she only has pain when she is standing up.  She cannot stand for long pain seems to be relieved in flexion  Review of systems as stated no other red flags  BP 112/60   Pulse 63   Temp (!) 97.5 F (36.4 C)   Ht 5\' 3"  (1.6 m)   Wt 124 lb (56.2 kg)   BMI 21.97 kg/m   Awake alert and oriented x3 mood affect normal gait without any assistive devices no major abnormalities  Mild tenderness in the lumbar spine  Lower extremities normal strength range of motion sensation and vascularity  X-ray shows scoliosis spondylosis and facet arthrosis  Diagnosis chronic back pain  Recommend diclofenac and physical therapy continue tramadol follow-up as needed  Encounter Diagnoses  Name Primary?  . Lumbar pain   . Chronic mid back pain Yes   Meds ordered this encounter  Medications  . diclofenac (CATAFLAM) 50 MG tablet    Sig: Take 1 tablet (50 mg total) by mouth 2 (two) times daily.    Dispense:  30 tablet    Refill:  5

## 2020-05-08 ENCOUNTER — Telehealth (HOSPITAL_COMMUNITY): Payer: Self-pay | Admitting: Physical Therapy

## 2020-05-08 DIAGNOSIS — J111 Influenza due to unidentified influenza virus with other respiratory manifestations: Secondary | ICD-10-CM | POA: Diagnosis not present

## 2020-05-08 DIAGNOSIS — R5383 Other fatigue: Secondary | ICD-10-CM | POA: Diagnosis not present

## 2020-05-08 DIAGNOSIS — D649 Anemia, unspecified: Secondary | ICD-10-CM | POA: Diagnosis not present

## 2020-05-08 NOTE — Telephone Encounter (Signed)
pt cancelled appt for 5/26 because she has felt bad for the past two days and she went to the urgent care and they told her she may have the flu

## 2020-05-09 ENCOUNTER — Ambulatory Visit (HOSPITAL_COMMUNITY): Payer: Medicare Other | Admitting: Physical Therapy

## 2020-05-17 DIAGNOSIS — E538 Deficiency of other specified B group vitamins: Secondary | ICD-10-CM | POA: Diagnosis not present

## 2020-05-22 ENCOUNTER — Ambulatory Visit (HOSPITAL_COMMUNITY): Payer: Medicare Other | Attending: Orthopedic Surgery | Admitting: Physical Therapy

## 2020-06-04 ENCOUNTER — Other Ambulatory Visit (HOSPITAL_COMMUNITY): Payer: Self-pay | Admitting: Nephrology

## 2020-06-04 ENCOUNTER — Other Ambulatory Visit: Payer: Self-pay | Admitting: Nephrology

## 2020-06-04 DIAGNOSIS — N184 Chronic kidney disease, stage 4 (severe): Secondary | ICD-10-CM

## 2020-06-08 ENCOUNTER — Ambulatory Visit (HOSPITAL_COMMUNITY)
Admission: RE | Admit: 2020-06-08 | Discharge: 2020-06-08 | Disposition: A | Discharge status: Home / Self Care | Payer: Medicare Other | Source: Ambulatory Visit | Attending: Nephrology | Admitting: Nephrology

## 2020-06-08 ENCOUNTER — Other Ambulatory Visit: Payer: Self-pay

## 2020-06-08 DIAGNOSIS — N184 Chronic kidney disease, stage 4 (severe): Secondary | ICD-10-CM | POA: Insufficient documentation

## 2020-06-14 ENCOUNTER — Inpatient Hospital Stay (HOSPITAL_COMMUNITY): Payer: Medicare Other

## 2020-06-14 ENCOUNTER — Inpatient Hospital Stay (HOSPITAL_COMMUNITY)
Admission: EM | Admit: 2020-06-14 | Discharge: 2020-06-20 | DRG: 521 | Disposition: A | Discharge status: Skilled Nursing Facility | Payer: Medicare Other | Attending: Internal Medicine | Admitting: Internal Medicine

## 2020-06-14 ENCOUNTER — Emergency Department (HOSPITAL_COMMUNITY): Payer: Medicare Other

## 2020-06-14 ENCOUNTER — Encounter (HOSPITAL_COMMUNITY): Payer: Self-pay

## 2020-06-14 ENCOUNTER — Telehealth: Payer: Self-pay | Admitting: Orthopedic Surgery

## 2020-06-14 DIAGNOSIS — Z419 Encounter for procedure for purposes other than remedying health state, unspecified: Secondary | ICD-10-CM

## 2020-06-14 DIAGNOSIS — N183 Chronic kidney disease, stage 3 unspecified: Secondary | ICD-10-CM | POA: Diagnosis present

## 2020-06-14 DIAGNOSIS — Z9011 Acquired absence of right breast and nipple: Secondary | ICD-10-CM | POA: Diagnosis not present

## 2020-06-14 DIAGNOSIS — Z87891 Personal history of nicotine dependence: Secondary | ICD-10-CM | POA: Diagnosis not present

## 2020-06-14 DIAGNOSIS — I132 Hypertensive heart and chronic kidney disease with heart failure and with stage 5 chronic kidney disease, or end stage renal disease: Secondary | ICD-10-CM | POA: Diagnosis not present

## 2020-06-14 DIAGNOSIS — M6281 Muscle weakness (generalized): Secondary | ICD-10-CM | POA: Diagnosis not present

## 2020-06-14 DIAGNOSIS — I4891 Unspecified atrial fibrillation: Secondary | ICD-10-CM | POA: Diagnosis present

## 2020-06-14 DIAGNOSIS — F319 Bipolar disorder, unspecified: Secondary | ICD-10-CM | POA: Diagnosis not present

## 2020-06-14 DIAGNOSIS — J9601 Acute respiratory failure with hypoxia: Secondary | ICD-10-CM | POA: Diagnosis not present

## 2020-06-14 DIAGNOSIS — J189 Pneumonia, unspecified organism: Secondary | ICD-10-CM

## 2020-06-14 DIAGNOSIS — S72091A Other fracture of head and neck of right femur, initial encounter for closed fracture: Secondary | ICD-10-CM | POA: Diagnosis not present

## 2020-06-14 DIAGNOSIS — N184 Chronic kidney disease, stage 4 (severe): Secondary | ICD-10-CM | POA: Diagnosis present

## 2020-06-14 DIAGNOSIS — Z20822 Contact with and (suspected) exposure to covid-19: Secondary | ICD-10-CM | POA: Diagnosis present

## 2020-06-14 DIAGNOSIS — E039 Hypothyroidism, unspecified: Secondary | ICD-10-CM | POA: Diagnosis not present

## 2020-06-14 DIAGNOSIS — F411 Generalized anxiety disorder: Secondary | ICD-10-CM | POA: Diagnosis not present

## 2020-06-14 DIAGNOSIS — Z96641 Presence of right artificial hip joint: Secondary | ICD-10-CM | POA: Diagnosis not present

## 2020-06-14 DIAGNOSIS — D6959 Other secondary thrombocytopenia: Secondary | ICD-10-CM | POA: Diagnosis not present

## 2020-06-14 DIAGNOSIS — D62 Acute posthemorrhagic anemia: Secondary | ICD-10-CM | POA: Diagnosis not present

## 2020-06-14 DIAGNOSIS — D509 Iron deficiency anemia, unspecified: Secondary | ICD-10-CM | POA: Diagnosis present

## 2020-06-14 DIAGNOSIS — R918 Other nonspecific abnormal finding of lung field: Secondary | ICD-10-CM | POA: Diagnosis not present

## 2020-06-14 DIAGNOSIS — Y92008 Other place in unspecified non-institutional (private) residence as the place of occurrence of the external cause: Secondary | ICD-10-CM | POA: Diagnosis not present

## 2020-06-14 DIAGNOSIS — K219 Gastro-esophageal reflux disease without esophagitis: Secondary | ICD-10-CM | POA: Diagnosis not present

## 2020-06-14 DIAGNOSIS — I5032 Chronic diastolic (congestive) heart failure: Secondary | ICD-10-CM | POA: Diagnosis not present

## 2020-06-14 DIAGNOSIS — Z79890 Hormone replacement therapy (postmenopausal): Secondary | ICD-10-CM

## 2020-06-14 DIAGNOSIS — Z90710 Acquired absence of both cervix and uterus: Secondary | ICD-10-CM | POA: Diagnosis not present

## 2020-06-14 DIAGNOSIS — Z7401 Bed confinement status: Secondary | ICD-10-CM | POA: Diagnosis not present

## 2020-06-14 DIAGNOSIS — Z79899 Other long term (current) drug therapy: Secondary | ICD-10-CM | POA: Diagnosis not present

## 2020-06-14 DIAGNOSIS — Z9181 History of falling: Secondary | ICD-10-CM | POA: Diagnosis not present

## 2020-06-14 DIAGNOSIS — E440 Moderate protein-calorie malnutrition: Secondary | ICD-10-CM | POA: Diagnosis not present

## 2020-06-14 DIAGNOSIS — F329 Major depressive disorder, single episode, unspecified: Secondary | ICD-10-CM | POA: Diagnosis not present

## 2020-06-14 DIAGNOSIS — J690 Pneumonitis due to inhalation of food and vomit: Secondary | ICD-10-CM | POA: Diagnosis not present

## 2020-06-14 DIAGNOSIS — M2550 Pain in unspecified joint: Secondary | ICD-10-CM | POA: Diagnosis not present

## 2020-06-14 DIAGNOSIS — R000 Tachycardia, unspecified: Secondary | ICD-10-CM | POA: Diagnosis not present

## 2020-06-14 DIAGNOSIS — R5381 Other malaise: Secondary | ICD-10-CM | POA: Diagnosis not present

## 2020-06-14 DIAGNOSIS — M159 Polyosteoarthritis, unspecified: Secondary | ICD-10-CM | POA: Diagnosis not present

## 2020-06-14 DIAGNOSIS — E876 Hypokalemia: Secondary | ICD-10-CM | POA: Diagnosis present

## 2020-06-14 DIAGNOSIS — F439 Reaction to severe stress, unspecified: Secondary | ICD-10-CM | POA: Diagnosis not present

## 2020-06-14 DIAGNOSIS — Z853 Personal history of malignant neoplasm of breast: Secondary | ICD-10-CM | POA: Diagnosis not present

## 2020-06-14 DIAGNOSIS — R52 Pain, unspecified: Secondary | ICD-10-CM | POA: Diagnosis not present

## 2020-06-14 DIAGNOSIS — M25551 Pain in right hip: Secondary | ICD-10-CM | POA: Diagnosis present

## 2020-06-14 DIAGNOSIS — S72001A Fracture of unspecified part of neck of right femur, initial encounter for closed fracture: Secondary | ICD-10-CM | POA: Diagnosis not present

## 2020-06-14 DIAGNOSIS — I480 Paroxysmal atrial fibrillation: Secondary | ICD-10-CM | POA: Diagnosis not present

## 2020-06-14 DIAGNOSIS — Z7983 Long term (current) use of bisphosphonates: Secondary | ICD-10-CM

## 2020-06-14 DIAGNOSIS — Z4789 Encounter for other orthopedic aftercare: Secondary | ICD-10-CM | POA: Diagnosis not present

## 2020-06-14 DIAGNOSIS — I10 Essential (primary) hypertension: Secondary | ICD-10-CM | POA: Diagnosis present

## 2020-06-14 DIAGNOSIS — Z6822 Body mass index (BMI) 22.0-22.9, adult: Secondary | ICD-10-CM

## 2020-06-14 DIAGNOSIS — F419 Anxiety disorder, unspecified: Secondary | ICD-10-CM | POA: Diagnosis present

## 2020-06-14 DIAGNOSIS — R262 Difficulty in walking, not elsewhere classified: Secondary | ICD-10-CM | POA: Diagnosis not present

## 2020-06-14 DIAGNOSIS — E43 Unspecified severe protein-calorie malnutrition: Secondary | ICD-10-CM | POA: Diagnosis not present

## 2020-06-14 DIAGNOSIS — K912 Postsurgical malabsorption, not elsewhere classified: Secondary | ICD-10-CM | POA: Diagnosis not present

## 2020-06-14 DIAGNOSIS — I130 Hypertensive heart and chronic kidney disease with heart failure and stage 1 through stage 4 chronic kidney disease, or unspecified chronic kidney disease: Secondary | ICD-10-CM | POA: Diagnosis not present

## 2020-06-14 DIAGNOSIS — J9691 Respiratory failure, unspecified with hypoxia: Secondary | ICD-10-CM | POA: Diagnosis not present

## 2020-06-14 DIAGNOSIS — W010XXA Fall on same level from slipping, tripping and stumbling without subsequent striking against object, initial encounter: Secondary | ICD-10-CM | POA: Diagnosis present

## 2020-06-14 DIAGNOSIS — W19XXXA Unspecified fall, initial encounter: Secondary | ICD-10-CM | POA: Diagnosis not present

## 2020-06-14 DIAGNOSIS — S72041D Displaced fracture of base of neck of right femur, subsequent encounter for closed fracture with routine healing: Secondary | ICD-10-CM | POA: Diagnosis not present

## 2020-06-14 DIAGNOSIS — N1832 Chronic kidney disease, stage 3b: Secondary | ICD-10-CM | POA: Diagnosis not present

## 2020-06-14 DIAGNOSIS — S72001P Fracture of unspecified part of neck of right femur, subsequent encounter for closed fracture with malunion: Secondary | ICD-10-CM | POA: Diagnosis not present

## 2020-06-14 DIAGNOSIS — N179 Acute kidney failure, unspecified: Secondary | ICD-10-CM

## 2020-06-14 DIAGNOSIS — Z01811 Encounter for preprocedural respiratory examination: Secondary | ICD-10-CM

## 2020-06-14 DIAGNOSIS — Z741 Need for assistance with personal care: Secondary | ICD-10-CM | POA: Diagnosis not present

## 2020-06-14 DIAGNOSIS — I350 Nonrheumatic aortic (valve) stenosis: Secondary | ICD-10-CM | POA: Diagnosis not present

## 2020-06-14 LAB — CBC WITH DIFFERENTIAL/PLATELET
Abs Immature Granulocytes: 0.09 10*3/uL — ABNORMAL HIGH (ref 0.00–0.07)
Basophils Absolute: 0 10*3/uL (ref 0.0–0.1)
Basophils Relative: 0 %
Eosinophils Absolute: 0.1 10*3/uL (ref 0.0–0.5)
Eosinophils Relative: 1 %
HCT: 31.8 % — ABNORMAL LOW (ref 36.0–46.0)
Hemoglobin: 10.2 g/dL — ABNORMAL LOW (ref 12.0–15.0)
Immature Granulocytes: 1 %
Lymphocytes Relative: 5 %
Lymphs Abs: 0.7 10*3/uL (ref 0.7–4.0)
MCH: 29.7 pg (ref 26.0–34.0)
MCHC: 32.1 g/dL (ref 30.0–36.0)
MCV: 92.7 fL (ref 80.0–100.0)
Monocytes Absolute: 0.8 10*3/uL (ref 0.1–1.0)
Monocytes Relative: 6 %
Neutro Abs: 11.7 10*3/uL — ABNORMAL HIGH (ref 1.7–7.7)
Neutrophils Relative %: 87 %
Platelets: 163 10*3/uL (ref 150–400)
RBC: 3.43 MIL/uL — ABNORMAL LOW (ref 3.87–5.11)
RDW: 15 % (ref 11.5–15.5)
WBC: 13.4 10*3/uL — ABNORMAL HIGH (ref 4.0–10.5)
nRBC: 0 % (ref 0.0–0.2)

## 2020-06-14 LAB — CBC
HCT: 31.9 % — ABNORMAL LOW (ref 36.0–46.0)
Hemoglobin: 10.4 g/dL — ABNORMAL LOW (ref 12.0–15.0)
MCH: 29.9 pg (ref 26.0–34.0)
MCHC: 32.6 g/dL (ref 30.0–36.0)
MCV: 91.7 fL (ref 80.0–100.0)
Platelets: 174 10*3/uL (ref 150–400)
RBC: 3.48 MIL/uL — ABNORMAL LOW (ref 3.87–5.11)
RDW: 14.6 % (ref 11.5–15.5)
WBC: 12.8 10*3/uL — ABNORMAL HIGH (ref 4.0–10.5)
nRBC: 0 % (ref 0.0–0.2)

## 2020-06-14 LAB — TROPONIN I (HIGH SENSITIVITY): Troponin I (High Sensitivity): 125 ng/L (ref <18)

## 2020-06-14 LAB — BASIC METABOLIC PANEL
Anion gap: 14 (ref 5–15)
BUN: 33 mg/dL — ABNORMAL HIGH (ref 8–23)
CO2: 21 mmol/L — ABNORMAL LOW (ref 22–32)
Calcium: 9.7 mg/dL (ref 8.9–10.3)
Chloride: 101 mmol/L (ref 98–111)
Creatinine, Ser: 1.62 mg/dL — ABNORMAL HIGH (ref 0.44–1.00)
GFR calc Af Amer: 32 mL/min — ABNORMAL LOW (ref >60)
GFR calc non Af Amer: 27 mL/min — ABNORMAL LOW (ref >60)
Glucose, Bld: 129 mg/dL — ABNORMAL HIGH (ref 70–99)
Potassium: 3.6 mmol/L (ref 3.5–5.1)
Sodium: 136 mmol/L (ref 135–145)

## 2020-06-14 LAB — TSH: TSH: 1.312 u[IU]/mL (ref 0.350–4.500)

## 2020-06-14 LAB — MAGNESIUM: Magnesium: 1.9 mg/dL (ref 1.7–2.4)

## 2020-06-14 LAB — SARS CORONAVIRUS 2 BY RT PCR (HOSPITAL ORDER, PERFORMED IN ~~LOC~~ HOSPITAL LAB): SARS Coronavirus 2: NEGATIVE

## 2020-06-14 MED ORDER — PRIMIDONE 50 MG PO TABS
50.0000 mg | ORAL_TABLET | Freq: Every day | ORAL | Status: DC
  Administered 2020-06-14 – 2020-06-19 (×6): 50 mg via ORAL
  Filled 2020-06-14 (×6): qty 1

## 2020-06-14 MED ORDER — ONDANSETRON HCL 4 MG PO TABS: 4.0000 mg | ORAL_TABLET | Freq: Four times a day (QID) | ORAL | Status: DC | PRN

## 2020-06-14 MED ORDER — METOPROLOL TARTRATE 25 MG PO TABS
25.0000 mg | ORAL_TABLET | Freq: Two times a day (BID) | ORAL | Status: DC
  Administered 2020-06-14 – 2020-06-16 (×4): 25 mg via ORAL
  Filled 2020-06-14 (×6): qty 1

## 2020-06-14 MED ORDER — HYDROMORPHONE HCL 1 MG/ML IJ SOLN
0.5000 mg | INTRAMUSCULAR | Status: DC | PRN
  Administered 2020-06-14 – 2020-06-15 (×3): 0.5 mg via INTRAVENOUS
  Filled 2020-06-14 (×3): qty 0.5

## 2020-06-14 MED ORDER — ALPRAZOLAM 1 MG PO TABS
1.0000 mg | ORAL_TABLET | Freq: Every evening | ORAL | Status: DC | PRN
  Administered 2020-06-14 – 2020-06-19 (×5): 1 mg via ORAL
  Filled 2020-06-14 (×5): qty 1

## 2020-06-14 MED ORDER — DULOXETINE HCL 30 MG PO CPEP: 30.0000 mg | ORAL_CAPSULE | Freq: Every day | ORAL | Status: DC

## 2020-06-14 MED ORDER — ONDANSETRON HCL 4 MG/2ML IJ SOLN: 4.0000 mg | Freq: Four times a day (QID) | INTRAMUSCULAR | Status: DC | PRN

## 2020-06-14 MED ORDER — POTASSIUM CHLORIDE IN NACL 20-0.9 MEQ/L-% IV SOLN
INTRAVENOUS | Status: DC
  Filled 2020-06-14: qty 1000

## 2020-06-14 MED ORDER — ACETAMINOPHEN 325 MG PO TABS
650.0000 mg | ORAL_TABLET | Freq: Four times a day (QID) | ORAL | Status: DC | PRN
  Administered 2020-06-15 – 2020-06-18 (×5): 650 mg via ORAL
  Filled 2020-06-14 (×7): qty 2

## 2020-06-14 MED ORDER — HYDROMORPHONE HCL 1 MG/ML IJ SOLN
0.5000 mg | Freq: Once | INTRAMUSCULAR | Status: AC
  Administered 2020-06-14: 0.5 mg via INTRAVENOUS
  Filled 2020-06-14: qty 1

## 2020-06-14 MED ORDER — ACETAMINOPHEN 650 MG RE SUPP: 650.0000 mg | Freq: Four times a day (QID) | RECTAL | Status: DC | PRN

## 2020-06-14 MED ORDER — PANTOPRAZOLE SODIUM 40 MG PO TBEC
40.0000 mg | DELAYED_RELEASE_TABLET | Freq: Two times a day (BID) | ORAL | Status: DC
  Administered 2020-06-14 – 2020-06-20 (×12): 40 mg via ORAL
  Filled 2020-06-14 (×12): qty 1

## 2020-06-14 MED ORDER — FENTANYL CITRATE (PF) 100 MCG/2ML IJ SOLN
50.0000 ug | Freq: Once | INTRAMUSCULAR | Status: AC
  Administered 2020-06-14: 50 ug via INTRAVENOUS
  Filled 2020-06-14: qty 2

## 2020-06-14 MED ORDER — POLYETHYLENE GLYCOL 3350 17 G PO PACK: 17.0000 g | PACK | Freq: Every day | ORAL | Status: DC | PRN

## 2020-06-14 MED ORDER — DULOXETINE HCL 30 MG PO CPEP
90.0000 mg | ORAL_CAPSULE | Freq: Every morning | ORAL | Status: DC
  Administered 2020-06-15 – 2020-06-20 (×6): 90 mg via ORAL
  Filled 2020-06-14 (×6): qty 3

## 2020-06-14 MED ORDER — LEVOTHYROXINE SODIUM 75 MCG PO TABS
75.0000 ug | ORAL_TABLET | Freq: Every day | ORAL | Status: DC
  Administered 2020-06-15 – 2020-06-20 (×6): 75 ug via ORAL
  Filled 2020-06-14 (×6): qty 1

## 2020-06-14 NOTE — H&P (Signed)
History and Physical    RAGEN LAVER VOJ:[id number] DOB: 09/01/1925 DOA: 06/14/2020  PCP: Redmond School, MD   Patient coming from: Home  I have personally briefly reviewed patient's old medical records in [place]  Chief Complaint: Fall, right hip pain  HPI: Jane Andrews is a 86 y.o. female with medical history significant for atrial fibrillation, CKD 3, hypertension, hypothyroidism. Patient reports she fell this morning. States she does not know why she fell. She woke up on the ground, she thinks she may have passed out. When patient woke up, she reports it was difficult for her to get up, then she went to have her bath and went to sit down on a chair and then the pain became so excruciating, she feels she might have passed out again or slept for about 2 hours. Niece is at bedside. She reports patient has had prior falls but not frequently. Patient denies any chest pain, no difficulty breathing. She has maintained good oral intake, no vomiting, no loose stools.  ED Course: Temperature 97.3, heart rate 105, blood pressure systolic 993Z to 169C.  Creatinine 1.6, hemoglobin 10.2.  WBC 13.4.  Head CT-possible hematoma involving scalp muscles- right posterior frontal and temporal bones.  Right shoulder and elbow x-ray is negative for acute abnormality.  Pelvic x-ray shows moderate displaced proximal right femoral neck fracture. EDP talked to Dr. Stann Mainland, recommended admission to [facility], possible surgery tomorrow by Dr. Ninfa Linden.  Review of Systems: As per HPI all other systems reviewed and negative.  Past Medical History:  Diagnosis Date  . Abdominal adhesions 1994  . Allergic rhinitis   . Anemia   . Anxiety and depression   . Aortic stenosis   . Arthritis   . Atrial fibrillation ([place]) 10/2012   Associated with severe anemia and esophageal pill impaction  . Breast carcinoma (HCC)    Right mastectomy  . Cholelithiasis   . Essential hypertension   .  Gastroesophageal reflux disease    Hiatal hernia  . History of blood transfusion   . Hypothyroidism   . Low back pain   . 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 Nephrolithiasis 2004   Painless hematuria  . Short gut syndrome    Bowel resection , 2004  . Upper GI bleed 2004   Multiple episodes of melena-? due to gastritis or adverse drug effect (nonsteroidals, small bowel ulceration with Fosamax); caused by Pepto-Bismol during one Emergency Department evaluation    Past Surgical History:  Procedure Laterality Date  . ABDOMINAL HYSTERECTOMY     emergency s/p delivery  . ABDOMINAL HYSTERECTOMY  1960   massive gynecologic bleeding  . BOWEL RESECTION     Resulting short gut syndrome  . CHOLECYSTECTOMY N/A 06/25/2018   Procedure: LAPAROSCOPIC CHOLECYSTECTOMY WITH INTRAOPERATIVE CHOLANGIOGRAM;  Surgeon: Armandina Gemma, MD;  Location: [facility];  Service: General;  Laterality: N/A;  . COLONOSCOPY W/ POLYPECTOMY  2005   Lipoma; diverticulosis  . COLONOSCOPY WITH ESOPHAGOGASTRODUODENOSCOPY (EGD)  11/22/2012   Rehman  . LAPAROSCOPIC LYSIS OF ADHESIONS  1965   s/p adhesions  . MASTECTOMY  right breast  . MASTECTOMY     Carcinoma of the breast; right  . UPPER GASTROINTESTINAL ENDOSCOPY       reports that she has quit smoking. Her smoking use included cigarettes. She has a 30.00 pack-year smoking history. She has never used smokeless tobacco. She reports that she does not drink alcohol and does not  use drugs.  Allergies  Allergen Reactions  . Codeine Nausea And Vomiting    Family History  Problem Relation Age of Onset  . Anuerysm Father   . Rheum arthritis Sister   . Healthy Sister   . COPD Sister   . Healthy Brother   . Cancer Other   . Colon cancer Neg Hx     Prior to Admission medications   Medication Sig Start Date End Date Taking? Authorizing Provider  ALPRAZolam Duanne Moron) 1 MG tablet Take 1 mg by mouth at bedtime as needed for sleep.    Yes [provider]  Cyanocobalamin (VITAMIN B-12 IJ) Inject as directed every 30 (thirty) days.   Yes [provider]  diclofenac (CATAFLAM) 50 MG tablet Take 1 tablet (50 mg total) by mouth 2 (two) times daily. 04/26/20  Yes Carole Civil, MD  DULoxetine (CYMBALTA) 30 MG capsule Take 30 mg by mouth daily. Take with 60mg  caps 05/15/20  Yes [provider]  DULoxetine (CYMBALTA) 60 MG capsule Take 60 mg by mouth every morning. Take with 30mg  caps   Yes [provider]  levothyroxine (SYNTHROID, LEVOTHROID) 75 MCG tablet Take 75 mcg by mouth daily before breakfast.   Yes [provider]  metoprolol tartrate (LOPRESSOR) 25 MG tablet TAKE 1 TABLET BY MOUTH TWICE A DAY: MAY TAKE EXTRA 1/2 TABLET FOR PALPITATIONS 06/29/19  Yes Satira Sark, MD  Multiple Vitamin (MULTIVITAMIN) tablet Take 1 tablet by mouth every morning.    Yes [provider]  pantoprazole (PROTONIX) 40 MG tablet Take 40 mg by mouth 2 (two) times daily. 04/24/20  Yes [provider]  primidone (MYSOLINE) 50 MG tablet Take 50 mg by mouth at bedtime. 04/20/20  Yes [provider]  traMADol (ULTRAM) 50 MG tablet Take 1-2 tablets (50-100 mg total) by mouth every 6 (six) hours as needed for moderate pain. 06/25/18  Yes Armandina Gemma, MD  alendronate (FOSAMAX) 70 MG tablet Take 70 mg by mouth once a week. Take with a full glass of water on an empty stomach.    [provider]  Pediatric Multivitamins-Iron Vicki Mallet W/IRON PO) Take by mouth daily.    [provider]  Probiotic Product (PROBIOTIC DAILY PO) Take by mouth daily.    [provider]    Physical Exam: Vitals:   06/14/20 1116 06/14/20 1117 06/14/20 1118 06/14/20 1600  BP:   (!) 162/86 (!) 143/75  Pulse:  (!) 105  (!) 102  Resp:  17  17  Temp:  (!) 97.3 F (36.3 C)    TempSrc:  Oral    SpO2:  100%  93%  Weight: 54.4 kg     Height: 5\' 1"  (1.549 m)       Constitutional:  NAD, calm, comfortable Vitals:   06/14/20 1116 06/14/20 1117 06/14/20 1118 06/14/20 1600  BP:   (!) 162/86 (!) 143/75  Pulse:  (!) 105  (!) 102  Resp:  17  17  Temp:  (!) 97.3 F (36.3 C)    TempSrc:  Oral    SpO2:  100%  93%  Weight: 54.4 kg     Height: 5\' 1"  (1.549 m)      Eyes: PERRL, lids and conjunctivae normal ENMT: Mucous membranes are moist. Posterior pharynx clear of any exudate or lesions Neck: normal, supple, no masses, no thyromegaly Respiratory: clear to auscultation bilaterally, no wheezing, no crackles. Normal respiratory effort. No accessory muscle use.  Cardiovascular: Regular rate and rhythm, no murmurs /  rubs / gallops. No extremity edema. 2+ pedal pulses.   Abdomen: no tenderness, no masses palpated. No hepatosplenomegaly. Bowel sounds positive.  Musculoskeletal: no clubbing / cyanosis. No joint deformity upper and lower extremities. Good ROM, no contractures. Normal muscle tone.  Skin: no rashes, lesions, ulcers. No induration Neurologic: No apparent Cranial nerve abnormality, moving extremities spontaneously. Psychiatric: Normal judgment and insight. Alert and oriented x 3. Normal mood.   Labs on Admission: I have personally reviewed following labs and imaging studies  CBC: Recent Labs  Lab 06/14/20 1313  WBC 13.4*  NEUTROABS 11.7*  HGB 10.2*  HCT 31.8*  MCV 92.7  PLT 010   Basic Metabolic Panel: Recent Labs  Lab 06/14/20 1313  NA 136  K 3.6  CL 101  CO2 21*  GLUCOSE 129*  BUN 33*  CREATININE 1.62*  CALCIUM 9.7    Radiological Exams on Admission: DG Shoulder Right  Result Date: 06/14/2020 CLINICAL DATA:  Right shoulder pain after a fall today. EXAM: RIGHT SHOULDER - 2+ VIEW COMPARISON:  06/24/2008 FINDINGS: No acute fracture or dislocation is identified. Degenerative changes are again noted at the greater tuberosity with subchondral cyst formation. Surgical clips are present in the axilla. IMPRESSION: No acute osseous abnormality.  Electronically Signed   By: Logan Bores M.D.   On: 06/14/2020 14:10   DG Elbow Complete Right  Result Date: 06/14/2020 CLINICAL DATA:  Right elbow pain after fall today. EXAM: RIGHT ELBOW - COMPLETE 3+ VIEW COMPARISON:  None. FINDINGS: There is no evidence of fracture, dislocation, or joint effusion. There is no evidence of arthropathy or other focal bone abnormality. Soft tissues are unremarkable. IMPRESSION: Negative. Electronically Signed   By: Marijo Conception M.D.   On: 06/14/2020 14:10   CT Head Wo Contrast  Result Date: 06/14/2020 CLINICAL DATA:  Headache following fall EXAM: CT HEAD WITHOUT CONTRAST TECHNIQUE: Contiguous axial images were obtained from the base of the skull through the vertex without intravenous contrast. COMPARISON:  July 21, 2008 FINDINGS: Brain: Age related volume loss present. There is no intracranial mass, hemorrhage, extra-axial fluid collection, or midline shift. There is patchy small vessel disease in the centra semiovale bilaterally. No acute appearing infarct evident. Vascular: No hyperdense vessel. There is calcification in each carotid siphon region. Skull: The bony calvarium appears intact. There is diffuse apparent edema in the scalp muscles overlying the right temporal and posterior frontal bone regions. No associated mass in this area on noncontrast enhanced study. Sinuses/Orbits: Mucosal thickening noted in several ethmoid air cells. Other paranasal sinuses are clear. Orbits appear symmetric bilaterally. Other: Mastoid air cells are clear. IMPRESSION: 1. Enlargement of the scalp muscles overlying the posterior frontal and temporal bones on the right. Question hematoma in these areas. Clinical assessment advised in this regard. Underlying bone appears intact. 2. Age related volume loss with patchy periventricular small vessel disease. No acute infarct. No mass or hemorrhage. 3.  There are foci of arterial vascular calcification. 4.  Mucosal thickening noted in several  ethmoid air cells. Electronically Signed   By: Lowella Grip III M.D.   On: 06/14/2020 13:48   DG Hip Unilat W or Wo Pelvis 2-3 Views Right  Result Date: 06/14/2020 CLINICAL DATA:  Right hip pain after fall today. EXAM: DG HIP (WITH OR WITHOUT PELVIS) 2-3V RIGHT COMPARISON:  None. FINDINGS: Moderately displaced fracture is seen involving proximal right femoral neck. Left hip is unremarkable. IMPRESSION: Moderately displaced proximal right femoral neck fracture. Electronically Signed   By: Jeneen Rinks  Murlean Caller M.D.   On: 06/14/2020 14:11    EKG: Independently reviewed.  Sinus tachycardia rate 103, QTc 482.  No significant ST-T wave changes compared to prior EKG.  Assessment/Plan Principal Problem:   Closed right hip fracture (HCC) Active Problems:   Hypertension   CKD (chronic kidney disease) stage 3, GFR 30-59 ml/min (HCC)   Hypothyroid   Atrial fibrillation (HCC)   IDA (iron deficiency anemia)   Right hip fracture-x-ray shows moderately displaced proximal right femoral neck fracture. -ED provider talked to Dr. Stann Mainland, admit to [facility], n.p.o. midnight, possible surgery tomorrow -IV Dilaudid 0.5 every 4 hourly as needed for pain -Pre-op Port chest x-ray -Foley catheter, patient requesting that she cannot void while lying in bed.  Acute kidney injury-mild, creatinine 1.6, baseline 1.2-1.3. - N/s + 20 KCL 75cc/hr x 20 hrs - BMP a.m  Possible syncope versus mechanical fall-reports history of prior falls. EKG without changes. Head CT negative for acute abnormality. ? Dehydration, with mild AKI, otherwise vitals stable, orthostatic deferred due to hip fracture. -obtain Echo - Trops x 2 -Observe on telemetry -Check TSH, magnesium  Leukocytosis mild-13.4. Denies GI, GU or respiratory complaints at this time. Likely stress reaction. - Trend.  History of atrial fibrillation-rate controlled. Not on anticoagulation, due to history of recurrent GI blood loss. Follows with Dr.  Domenic Polite. -Resume metoprolol 25 mg twice daily  Depression, anxiety -Resume Xanax nightly, Cymbalta   DVT prophylaxis: SCDs Code Status: Full code Family Communication: Niece- [name] at bedside, who is healthcare power of attorney. Disposition Plan: > 2 days Consults called: Orthopedics Admission status: Inpatient, telemetry I certify that at the point of admission it is my clinical judgment that the patient will require inpatient hospital care spanning beyond 2 midnights from the point of admission due to high intensity of service, high risk for further deterioration and high frequency of surveillance required. The following factors support the patient status of inpatient: Needs inpatient surgical procedure.   Bethena Roys MD [facility]  06/14/2020, 6:34 PM

## 2020-06-14 NOTE — ED Triage Notes (Signed)
Pt fell this morning. She is unsure of how she fell. Right hip pain and skin tear to elbows. Pt also has a hematoma to right head.

## 2020-06-14 NOTE — ED Provider Notes (Signed)
Patient with a broken hip.  Dr. Aline Brochure is unable to take care of the patient because he cannot be out of town.  I spoke with Dr. Stann Mainland orthopedics is on-call.  He stated that hospitalist can admit the patient to [facility] and he will plan to try to do the surgery tomorrow the patient should be n.p.o. after midnight   Milton Ferguson, MD 06/14/20 1558

## 2020-06-14 NOTE — ED Notes (Signed)
Pt gave verbal consent for transfer to [facility] and understands benefits and risks.   Signature pad in room not working at this time.

## 2020-06-14 NOTE — ED Notes (Signed)
Attempted report to [facility]. Nurse unavailable at this time. States will call back for report.

## 2020-06-14 NOTE — ED Provider Notes (Signed)
[facility] Provider [facility]   CSN: [id number] Arrival date & time: 06/14/20  1111     History Chief Complaint  Patient presents with  . Fall    Jane Andrews is a 86 y.o. female.  HPI 86 year old female presents with fall.  She was walking back from breakfast down the hallway and all of a sudden fell.  She does not remember the fall itself but remembers being on the ground.  She is not sure if she passed out.  She felt no preceding dizziness, headache, chest pain or shortness of breath.  No palpitations.  She does have a history of A. fib.  She has a large hematoma to her right head and is having pain in her right elbow, shoulder, and right buttock/hip.   Past Medical History:  Diagnosis Date  . Abdominal adhesions 1994  . Allergic rhinitis   . Anemia   . Anxiety and depression   . Aortic stenosis   . Arthritis   . Atrial fibrillation ([place]) 10/2012   Associated with severe anemia and esophageal pill impaction  . Breast carcinoma (HCC)    Right mastectomy  . Cholelithiasis   . Essential hypertension   . Gastroesophageal reflux disease    Hiatal hernia  . History of blood transfusion   . Hypothyroidism   . Low back pain   . 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 Nephrolithiasis 2004   Painless hematuria  . Short gut syndrome    Bowel resection , 2004  . Upper GI bleed 2004   Multiple episodes of melena-? due to gastritis or adverse drug effect (nonsteroidals, small bowel ulceration with Fosamax); caused by Pepto-Bismol during one Emergency Department evaluation    Patient Active Problem List   Diagnosis Date Noted  . IDA (iron deficiency anemia) 02/28/2020  . Atrial fibrillation with RVR ([place]) 12/25/2018  . GERD (gastroesophageal reflux disease) 12/25/2018  . Chronic cholecystitis s/p lap cholecystectomy 06/25/2018 06/18/2018  . Obstipation 04/12/2016  . Vagal reaction 04/12/2016  . Abdominal pain  09/19/2014  . 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 Ecchymoses, spontaneous 05/31/2013  . Hypothyroid   . Atrial fibrillation ([place])   . Breast carcinoma ([place])   . Malabsorption   . CKD (chronic kidney disease) stage 3, GFR 30-59 ml/min (HCC) 10/21/2012  . Anemia, normocytic normochromic 07/06/2012  . Chronic diarrhea 02/10/2012  . Hypertension 02/10/2012    Past Surgical History:  Procedure Laterality Date  . ABDOMINAL HYSTERECTOMY     emergency s/p delivery  . ABDOMINAL HYSTERECTOMY  1960   massive gynecologic bleeding  . BOWEL RESECTION     Resulting short gut syndrome  . CHOLECYSTECTOMY N/A 06/25/2018   Procedure: LAPAROSCOPIC CHOLECYSTECTOMY WITH INTRAOPERATIVE CHOLANGIOGRAM;  Surgeon: Armandina Gemma, MD;  Location: [facility];  Service: General;  Laterality: N/A;  . COLONOSCOPY W/ POLYPECTOMY  2005   Lipoma; diverticulosis  . COLONOSCOPY WITH ESOPHAGOGASTRODUODENOSCOPY (EGD)  11/22/2012   [facility]  . LAPAROSCOPIC LYSIS OF ADHESIONS  1965   s/p adhesions  . MASTECTOMY  right breast  . MASTECTOMY     Carcinoma of the breast; right  . UPPER GASTROINTESTINAL ENDOSCOPY       OB History    Gravida      Para      Term      Preterm      AB      Living  0     SAB  TAB      Ectopic      Multiple      Live Births              Family History  Problem Relation Age of Onset  . Anuerysm Father   . Rheum arthritis Sister   . Healthy Sister   . COPD Sister   . Healthy Brother   . Cancer Other   . Colon cancer Neg Hx     Social History   Tobacco Use  . Smoking status: Former Smoker    Packs/day: 1.50    Years: 20.00    Pack years: 30.00    Types: Cigarettes  . Smokeless tobacco: Never Used  Vaping Use  . Vaping Use: Never used  Substance Use Topics  . Alcohol use: No  . Drug use: No    Home Medications Prior to Admission medications   Medication Sig Start Date End Date Taking? Authorizing Provider  ALPRAZolam Duanne Moron) 1 MG tablet Take 1  mg by mouth at bedtime as needed for sleep.   Yes [provider]  Cyanocobalamin (VITAMIN B-12 IJ) Inject as directed every 30 (thirty) days.   Yes [provider]  diclofenac (CATAFLAM) 50 MG tablet Take 1 tablet (50 mg total) by mouth 2 (two) times daily. 04/26/20  Yes Carole Civil, MD  DULoxetine (CYMBALTA) 30 MG capsule Take 30 mg by mouth daily. Take with 60mg  caps 05/15/20  Yes [provider]  DULoxetine (CYMBALTA) 60 MG capsule Take 60 mg by mouth every morning. Take with 30mg  caps   Yes [provider]  levothyroxine (SYNTHROID, LEVOTHROID) 75 MCG tablet Take 75 mcg by mouth daily before breakfast.   Yes [provider]  metoprolol tartrate (LOPRESSOR) 25 MG tablet TAKE 1 TABLET BY MOUTH TWICE A DAY: MAY TAKE EXTRA 1/2 TABLET FOR PALPITATIONS 06/29/19  Yes Satira Sark, MD  Multiple Vitamin (MULTIVITAMIN) tablet Take 1 tablet by mouth every morning.    Yes [provider]  pantoprazole (PROTONIX) 40 MG tablet Take 40 mg by mouth 2 (two) times daily. 04/24/20  Yes [provider]  primidone (MYSOLINE) 50 MG tablet Take 50 mg by mouth at bedtime. 04/20/20  Yes [provider]  traMADol (ULTRAM) 50 MG tablet Take 1-2 tablets (50-100 mg total) by mouth every 6 (six) hours as needed for moderate pain. 06/25/18  Yes Armandina Gemma, MD  alendronate (FOSAMAX) 70 MG tablet Take 70 mg by mouth once a week. Take with a full glass of water on an empty stomach.    [provider]  Pediatric Multivitamins-Iron Vicki Mallet W/IRON PO) Take by mouth daily.    [provider]  Probiotic Product (PROBIOTIC DAILY PO) Take by mouth daily.    [provider]    Allergies    Codeine  Review of Systems   Review of Systems  Respiratory: Negative for shortness of breath.   Cardiovascular: Negative for chest pain and palpitations.  Musculoskeletal: Positive for arthralgias.  Skin: Positive for wound.    Neurological: Positive for syncope and headaches.  All other systems reviewed and are negative.   Physical Exam Updated Vital Signs BP (!) 162/86   Pulse (!) 105   Temp (!) 97.3 F (36.3 C) (Oral)   Resp 17   Ht 5\' 1"  (1.549 m)   Wt 54.4 kg   SpO2 100%   BMI 22.67 kg/m   Physical Exam Vitals and nursing note reviewed.  Constitutional:  Appearance: She is well-developed.  HENT:     Head: Normocephalic.      Right Ear: External ear normal.     Left Ear: External ear normal.     Nose: Nose normal.  Eyes:     General:        Right eye: No discharge.        Left eye: No discharge.  Cardiovascular:     Rate and Rhythm: Normal rate and regular rhythm.     Heart sounds: Normal heart sounds.  Pulmonary:     Effort: Pulmonary effort is normal.     Breath sounds: Normal breath sounds.  Abdominal:     Palpations: Abdomen is soft.     Tenderness: There is no abdominal tenderness.  Musculoskeletal:     Right shoulder: Tenderness present. No deformity. Normal range of motion.     Right upper arm: No tenderness.     Right elbow: Swelling (mild) and laceration (skin tear) present. Normal range of motion. Tenderness present.     Right forearm: No tenderness.     Right wrist: No tenderness.     Right hand: No tenderness.     Cervical back: No spinous process tenderness or muscular tenderness.     Right hip: Tenderness (mild, lateral with small ecchymosis) present. No deformity. Normal range of motion.       Legs:  Skin:    General: Skin is warm and dry.  Neurological:     Mental Status: She is alert.  Psychiatric:        Mood and Affect: Mood is not anxious.     ED Results / Procedures / Treatments   Labs (all labs ordered are listed, but only abnormal results are displayed) Labs Reviewed  BASIC METABOLIC PANEL - Abnormal; Notable for the following components:      Result Value   CO2 21 (*)    Glucose, Bld 129 (*)    BUN 33 (*)    Creatinine, Ser 1.62 (*)     GFR calc non Af Amer 27 (*)    GFR calc Af Amer 32 (*)    All other components within normal limits  CBC WITH DIFFERENTIAL/PLATELET - Abnormal; Notable for the following components:   WBC 13.4 (*)    RBC 3.43 (*)    Hemoglobin 10.2 (*)    HCT 31.8 (*)    Neutro Abs 11.7 (*)    Abs Immature Granulocytes 0.09 (*)    All other components within normal limits  SARS CORONAVIRUS 2 BY RT PCR [facility] ORDER, [place] LAB)    EKG EKG Interpretation  Date/Time:  Thursday June 14 2020 13:53:58 EDT Ventricular Rate:  103 PR Interval:    QRS Duration: 85 QT Interval:  368 QTC Calculation: 482 R Axis:   -2 Text Interpretation: Sinus tachycardia Abnormal R-wave progression, early transition Confirmed by Sherwood Gambler [phone]) on 06/14/2020 3:11:44 PM   Radiology DG Shoulder Right  Result Date: 06/14/2020 CLINICAL DATA:  Right shoulder pain after a fall today. EXAM: RIGHT SHOULDER - 2+ VIEW COMPARISON:  06/24/2008 FINDINGS: No acute fracture or dislocation is identified. Degenerative changes are again noted at the greater tuberosity with subchondral cyst formation. Surgical clips are present in the axilla. IMPRESSION: No acute osseous abnormality. Electronically Signed   By: Logan Bores M.D.   On: 06/14/2020 14:10   DG Elbow Complete Right  Result Date: 06/14/2020 CLINICAL DATA:  Right elbow pain after fall today. EXAM: RIGHT  ELBOW - COMPLETE 3+ VIEW COMPARISON:  None. FINDINGS: There is no evidence of fracture, dislocation, or joint effusion. There is no evidence of arthropathy or other focal bone abnormality. Soft tissues are unremarkable. IMPRESSION: Negative. Electronically Signed   By: Marijo Conception M.D.   On: 06/14/2020 14:10   CT Head Wo Contrast  Result Date: 06/14/2020 CLINICAL DATA:  Headache following fall EXAM: CT HEAD WITHOUT CONTRAST TECHNIQUE: Contiguous axial images were obtained from the base of the skull through the vertex without intravenous contrast.  COMPARISON:  July 21, 2008 FINDINGS: Brain: Age related volume loss present. There is no intracranial mass, hemorrhage, extra-axial fluid collection, or midline shift. There is patchy small vessel disease in the centra semiovale bilaterally. No acute appearing infarct evident. Vascular: No hyperdense vessel. There is calcification in each carotid siphon region. Skull: The bony calvarium appears intact. There is diffuse apparent edema in the scalp muscles overlying the right temporal and posterior frontal bone regions. No associated mass in this area on noncontrast enhanced study. Sinuses/Orbits: Mucosal thickening noted in several ethmoid air cells. Other paranasal sinuses are clear. Orbits appear symmetric bilaterally. Other: Mastoid air cells are clear. IMPRESSION: 1. Enlargement of the scalp muscles overlying the posterior frontal and temporal bones on the right. Question hematoma in these areas. Clinical assessment advised in this regard. Underlying bone appears intact. 2. Age related volume loss with patchy periventricular small vessel disease. No acute infarct. No mass or hemorrhage. 3.  There are foci of arterial vascular calcification. 4.  Mucosal thickening noted in several ethmoid air cells. Electronically Signed   By: Lowella Grip III M.D.   On: 06/14/2020 13:48   DG Hip Unilat W or Wo Pelvis 2-3 Views Right  Result Date: 06/14/2020 CLINICAL DATA:  Right hip pain after fall today. EXAM: DG HIP (WITH OR WITHOUT PELVIS) 2-3V RIGHT COMPARISON:  None. FINDINGS: Moderately displaced fracture is seen involving proximal right femoral neck. Left hip is unremarkable. IMPRESSION: Moderately displaced proximal right femoral neck fracture. Electronically Signed   By: Marijo Conception M.D.   On: 06/14/2020 14:11    Procedures Procedures (including critical care time)  Medications Ordered in ED Medications  fentaNYL (SUBLIMAZE) injection 50 mcg (50 mcg Intravenous Given 06/14/20 1503)    ED Course  I  have reviewed the triage vital signs and the nursing notes.  Pertinent labs & imaging results that were available during my care of the patient were reviewed by me and considered in my medical decision making (see chart for details).    MDM Rules/Calculators/A&P                          Unclear cause of the patient's fall, consider syncope given she does not remember.  She is not in distress but does have an femoral neck fracture on the x-ray. Trying to reach Dr. Aline Brochure, who is her orthopedist, to see if he is available to take care of her. Otherwise may need to go to [facility]. Care to Dr. Roderic Palau. Final Clinical Impression(s) / ED Diagnoses Final diagnoses:  Closed fracture of neck of right femur, initial encounter [facility])    Rx / DC Orders ED Discharge Orders    None       Sherwood Gambler, MD 06/14/20 1540

## 2020-06-14 NOTE — ED Provider Notes (Signed)
Dr. Stann Mainland called back and stated that Dr. Zollie Beckers will be the orthopedic surgeon that we will fix Ms. Rosendahl's hip.  Dr. Ninfa Linden would like the patient at [facility] and the surgery will be done tomorrow   Milton Ferguson, MD 06/14/20 1616

## 2020-06-14 NOTE — ED Notes (Signed)
PT transported to X-Ray. Unable to complete EKG at this time.

## 2020-06-14 NOTE — Progress Notes (Signed)
Patient ID: Jane Andrews, female   DOB: Oct 04, 1925, 86 y.o.   MRN: [id number] I was asked if I could take care of this patient in terms of addressing her right hip fracture.  She sustained a mechanical fall and was taken to [facility].  She was found to have a displaced right hip femoral neck fracture.  There is no orthopedic coverage for [facility] in the orthopedic in the from down here in [place] who is on-call asked what I resume her care in [place].  Since I am operating at [facility] and performing hip surgery, I agreed to put her on the schedule tomorrow for a partial versus total hip arthroplasty.  She should be n.p.o. after midnight and I will see her in the morning to talk further about surgery late in the afternoon tomorrow.

## 2020-06-14 NOTE — Telephone Encounter (Signed)
(  copied/pasted from Staff message) From: Uvaldo Bristle Sent: 06/14/2020   2:37 PM EDT To: Carole Civil, MD, Brand Males, RT Subject: Call from [facility] ED                               Dr Aline Brochure, Abigail Butts,  Call was received from [place] - a few minutes ago, approx 2:35pm  relaying that a patient of Dr Ruthe Mannan is in the E.R. and has a hip fracture.  States patient is asking for Dr Aline Brochure.  Ph# "direct to Dr Regenia Skeeter" is [phone]*  *Per Abigail Butts, per Dr Aline Brochure, relayed back to Emergency room - spoke with Dr Roderic Palau, that Dr Aline Brochure will be going away therefore patient will need to be treated by ortho on call.

## 2020-06-14 NOTE — Progress Notes (Signed)
Case discussed in detail with Dr. Roderic Palau.  The images and clinical history of been reviewed.  This patient will need operative management of this right hip fracture.  Fortunately, she is not on any anticoagulation, and can be managed in a semiurgent fashion.  We will plan for transfer to [facility], and operative treatment with anterior hemiarthroplasty by Dr. Jean Rosenthal.  She will need to be n.p.o. tonight at midnight and have an expeditious transfer to [facility] for tomorrow's surgery.

## 2020-06-15 ENCOUNTER — Inpatient Hospital Stay (HOSPITAL_COMMUNITY): Payer: Medicare Other | Admitting: Certified Registered Nurse Anesthetist

## 2020-06-15 ENCOUNTER — Inpatient Hospital Stay (HOSPITAL_COMMUNITY): Payer: Medicare Other

## 2020-06-15 ENCOUNTER — Encounter (HOSPITAL_COMMUNITY)
Admission: EM | Disposition: A | Discharge status: Skilled Nursing Facility | Payer: Self-pay | Source: Home / Self Care | Attending: Family Medicine

## 2020-06-15 ENCOUNTER — Other Ambulatory Visit: Payer: Self-pay

## 2020-06-15 ENCOUNTER — Encounter (HOSPITAL_COMMUNITY): Payer: Self-pay | Admitting: Internal Medicine

## 2020-06-15 DIAGNOSIS — S72001A Fracture of unspecified part of neck of right femur, initial encounter for closed fracture: Secondary | ICD-10-CM

## 2020-06-15 DIAGNOSIS — I350 Nonrheumatic aortic (valve) stenosis: Secondary | ICD-10-CM

## 2020-06-15 HISTORY — PX: HIP ARTHROPLASTY: SHX981

## 2020-06-15 LAB — ECHOCARDIOGRAM COMPLETE
Height: 61 in
Weight: 1920 oz

## 2020-06-15 LAB — BASIC METABOLIC PANEL
Anion gap: 8 (ref 5–15)
BUN: 35 mg/dL — ABNORMAL HIGH (ref 8–23)
CO2: 25 mmol/L (ref 22–32)
Calcium: 8.9 mg/dL (ref 8.9–10.3)
Chloride: 103 mmol/L (ref 98–111)
Creatinine, Ser: 1.91 mg/dL — ABNORMAL HIGH (ref 0.44–1.00)
GFR calc Af Amer: 26 mL/min — ABNORMAL LOW (ref >60)
GFR calc non Af Amer: 22 mL/min — ABNORMAL LOW (ref >60)
Glucose, Bld: 118 mg/dL — ABNORMAL HIGH (ref 70–99)
Potassium: 4.9 mmol/L (ref 3.5–5.1)
Sodium: 136 mmol/L (ref 135–145)

## 2020-06-15 LAB — POCT I-STAT EG7
Acid-base deficit: 4 mmol/L — ABNORMAL HIGH (ref 0.0–2.0)
Bicarbonate: 23 mmol/L (ref 20.0–28.0)
Calcium, Ion: 1.25 mmol/L (ref 1.15–1.40)
HCT: 23 % — ABNORMAL LOW (ref 36.0–46.0)
Hemoglobin: 7.8 g/dL — ABNORMAL LOW (ref 12.0–15.0)
O2 Saturation: 23 %
Potassium: 4.6 mmol/L (ref 3.5–5.1)
Sodium: 137 mmol/L (ref 135–145)
TCO2: 25 mmol/L (ref 22–32)
pCO2, Ven: 51.7 mmHg (ref 44.0–60.0)
pH, Ven: 7.256 (ref 7.250–7.430)
pO2, Ven: 19 mmHg — CL (ref 32.0–45.0)

## 2020-06-15 LAB — CBC
HCT: 25.9 % — ABNORMAL LOW (ref 36.0–46.0)
Hemoglobin: 8.3 g/dL — ABNORMAL LOW (ref 12.0–15.0)
MCH: 29.1 pg (ref 26.0–34.0)
MCHC: 32 g/dL (ref 30.0–36.0)
MCV: 90.9 fL (ref 80.0–100.0)
Platelets: 134 10*3/uL — ABNORMAL LOW (ref 150–400)
RBC: 2.85 MIL/uL — ABNORMAL LOW (ref 3.87–5.11)
RDW: 14.8 % (ref 11.5–15.5)
WBC: 14.1 10*3/uL — ABNORMAL HIGH (ref 4.0–10.5)
nRBC: 0 % (ref 0.0–0.2)

## 2020-06-15 LAB — SURGICAL PCR SCREEN
MRSA, PCR: NEGATIVE
Staphylococcus aureus: NEGATIVE

## 2020-06-15 SURGERY — HEMIARTHROPLASTY, HIP, DIRECT ANTERIOR APPROACH, FOR FRACTURE
Anesthesia: Spinal | Site: Hip | Laterality: Right

## 2020-06-15 MED ORDER — PHENOL 1.4 % MT LIQD: 1.0000 | OROMUCOSAL | Status: DC | PRN

## 2020-06-15 MED ORDER — PROPOFOL 500 MG/50ML IV EMUL
INTRAVENOUS | Status: DC | PRN
  Administered 2020-06-15: 50 ug/kg/min via INTRAVENOUS

## 2020-06-15 MED ORDER — ACETAMINOPHEN 325 MG PO TABS
325.0000 mg | ORAL_TABLET | Freq: Four times a day (QID) | ORAL | Status: DC | PRN
  Administered 2020-06-19: 325 mg via ORAL
  Filled 2020-06-15: qty 1

## 2020-06-15 MED ORDER — MUPIROCIN 2 % EX OINT
1.0000 "application " | TOPICAL_OINTMENT | Freq: Two times a day (BID) | CUTANEOUS | Status: AC
  Administered 2020-06-15 – 2020-06-19 (×10): 1 via NASAL
  Filled 2020-06-15 (×2): qty 22

## 2020-06-15 MED ORDER — SODIUM CHLORIDE 0.9 % IV BOLUS
500.0000 mL | Freq: Once | INTRAVENOUS | Status: AC
  Administered 2020-06-15: 500 mL via INTRAVENOUS

## 2020-06-15 MED ORDER — MENTHOL 3 MG MT LOZG: 1.0000 | LOZENGE | OROMUCOSAL | Status: DC | PRN

## 2020-06-15 MED ORDER — METOCLOPRAMIDE HCL 5 MG PO TABS: 5.0000 mg | ORAL_TABLET | Freq: Three times a day (TID) | ORAL | Status: DC | PRN

## 2020-06-15 MED ORDER — CEFAZOLIN SODIUM-DEXTROSE 1-4 GM/50ML-% IV SOLN
1.0000 g | Freq: Four times a day (QID) | INTRAVENOUS | Status: DC
  Filled 2020-06-15 (×2): qty 50

## 2020-06-15 MED ORDER — METHOCARBAMOL 500 MG PO TABS: 500.0000 mg | ORAL_TABLET | Freq: Four times a day (QID) | ORAL | Status: DC | PRN

## 2020-06-15 MED ORDER — PHENYLEPHRINE HCL-NACL 10-0.9 MG/250ML-% IV SOLN
INTRAVENOUS | Status: DC | PRN
Start: 2020-06-15 — End: 2020-06-15
  Administered 2020-06-15: 25 ug/min via INTRAVENOUS

## 2020-06-15 MED ORDER — STERILE WATER FOR IRRIGATION IR SOLN
Status: DC | PRN
  Administered 2020-06-15 (×2): 1000 mL

## 2020-06-15 MED ORDER — CHLORHEXIDINE GLUCONATE CLOTH 2 % EX PADS
6.0000 | MEDICATED_PAD | Freq: Every day | CUTANEOUS | Status: DC
  Administered 2020-06-15 – 2020-06-19 (×5): 6 via TOPICAL

## 2020-06-15 MED ORDER — TRANEXAMIC ACID-NACL 1000-0.7 MG/100ML-% IV SOLN
INTRAVENOUS | Status: AC
  Filled 2020-06-15: qty 100

## 2020-06-15 MED ORDER — HYDROCODONE-ACETAMINOPHEN 7.5-325 MG PO TABS
1.0000 | ORAL_TABLET | ORAL | Status: DC | PRN
  Administered 2020-06-16: 2 via ORAL
  Filled 2020-06-15: qty 2

## 2020-06-15 MED ORDER — PROPOFOL 10 MG/ML IV BOLUS
INTRAVENOUS | Status: DC | PRN
  Administered 2020-06-15 (×3): 20 mg via INTRAVENOUS

## 2020-06-15 MED ORDER — TRANEXAMIC ACID-NACL 1000-0.7 MG/100ML-% IV SOLN
1000.0000 mg | INTRAVENOUS | Status: AC
  Administered 2020-06-15: 1000 mg via INTRAVENOUS

## 2020-06-15 MED ORDER — ORAL CARE MOUTH RINSE
15.0000 mL | Freq: Two times a day (BID) | OROMUCOSAL | Status: DC
  Administered 2020-06-15 – 2020-06-18 (×8): 15 mL via OROMUCOSAL

## 2020-06-15 MED ORDER — SODIUM CHLORIDE 0.9 % IR SOLN
Status: DC | PRN
  Administered 2020-06-15: 1000 mL

## 2020-06-15 MED ORDER — LIDOCAINE 2% (20 MG/ML) 5 ML SYRINGE
INTRAMUSCULAR | Status: DC | PRN
  Administered 2020-06-15: 20 mg via INTRAVENOUS

## 2020-06-15 MED ORDER — ONDANSETRON HCL 4 MG/2ML IJ SOLN: 4.0000 mg | Freq: Four times a day (QID) | INTRAMUSCULAR | Status: DC | PRN

## 2020-06-15 MED ORDER — FENTANYL CITRATE (PF) 250 MCG/5ML IJ SOLN
INTRAMUSCULAR | Status: DC | PRN
  Administered 2020-06-15 (×2): 25 ug via INTRAVENOUS

## 2020-06-15 MED ORDER — DIPHENHYDRAMINE HCL 12.5 MG/5ML PO ELIX: 12.5000 mg | ORAL_SOLUTION | ORAL | Status: DC | PRN

## 2020-06-15 MED ORDER — SODIUM CHLORIDE 0.9 % IV SOLN: INTRAVENOUS | Status: DC

## 2020-06-15 MED ORDER — PHENYLEPHRINE HCL (PRESSORS) 10 MG/ML IV SOLN
INTRAVENOUS | Status: AC
  Filled 2020-06-15: qty 1

## 2020-06-15 MED ORDER — PROPOFOL 10 MG/ML IV BOLUS
INTRAVENOUS | Status: AC
  Filled 2020-06-15: qty 20

## 2020-06-15 MED ORDER — SODIUM CHLORIDE 0.9 % IV SOLN
2.0000 g | INTRAVENOUS | Status: DC
  Administered 2020-06-15 – 2020-06-19 (×5): 2 g via INTRAVENOUS
  Filled 2020-06-15: qty 2
  Filled 2020-06-15 (×4): qty 20

## 2020-06-15 MED ORDER — CEFAZOLIN SODIUM-DEXTROSE 2-4 GM/100ML-% IV SOLN
2.0000 g | Freq: Once | INTRAVENOUS | Status: AC
  Administered 2020-06-15: 2 g via INTRAVENOUS

## 2020-06-15 MED ORDER — ONDANSETRON HCL 4 MG/2ML IJ SOLN
INTRAMUSCULAR | Status: DC | PRN
  Administered 2020-06-15: 4 mg via INTRAVENOUS

## 2020-06-15 MED ORDER — LACTATED RINGERS IV SOLN: INTRAVENOUS | Status: DC

## 2020-06-15 MED ORDER — ASPIRIN 81 MG PO CHEW
81.0000 mg | CHEWABLE_TABLET | Freq: Two times a day (BID) | ORAL | Status: DC
  Administered 2020-06-15 – 2020-06-20 (×10): 81 mg via ORAL
  Filled 2020-06-15 (×10): qty 1

## 2020-06-15 MED ORDER — CEFAZOLIN SODIUM-DEXTROSE 2-4 GM/100ML-% IV SOLN
INTRAVENOUS | Status: AC
  Filled 2020-06-15: qty 100

## 2020-06-15 MED ORDER — HYDROCODONE-ACETAMINOPHEN 5-325 MG PO TABS
1.0000 | ORAL_TABLET | ORAL | Status: DC | PRN
  Administered 2020-06-16 – 2020-06-18 (×5): 1 via ORAL
  Administered 2020-06-19: 2 via ORAL
  Filled 2020-06-15 (×4): qty 1
  Filled 2020-06-15: qty 2
  Filled 2020-06-15: qty 1

## 2020-06-15 MED ORDER — METOCLOPRAMIDE HCL 5 MG/ML IJ SOLN: 5.0000 mg | Freq: Three times a day (TID) | INTRAMUSCULAR | Status: DC | PRN

## 2020-06-15 MED ORDER — BUPIVACAINE IN DEXTROSE 0.75-8.25 % IT SOLN
INTRATHECAL | Status: DC | PRN
  Administered 2020-06-15: 1.6 mL via INTRATHECAL

## 2020-06-15 MED ORDER — ONDANSETRON HCL 4 MG PO TABS: 4.0000 mg | ORAL_TABLET | Freq: Four times a day (QID) | ORAL | Status: DC | PRN

## 2020-06-15 MED ORDER — FENTANYL CITRATE (PF) 100 MCG/2ML IJ SOLN
INTRAMUSCULAR | Status: AC
  Filled 2020-06-15: qty 2

## 2020-06-15 MED ORDER — DOCUSATE SODIUM 100 MG PO CAPS
100.0000 mg | ORAL_CAPSULE | Freq: Two times a day (BID) | ORAL | Status: DC
  Administered 2020-06-15 – 2020-06-18 (×6): 100 mg via ORAL
  Filled 2020-06-15 (×6): qty 1

## 2020-06-15 MED ORDER — MORPHINE SULFATE (PF) 2 MG/ML IV SOLN
0.5000 mg | INTRAVENOUS | Status: DC | PRN
  Filled 2020-06-15: qty 1

## 2020-06-15 MED ORDER — SODIUM CHLORIDE 0.9 % IV SOLN
500.0000 mg | INTRAVENOUS | Status: DC
  Administered 2020-06-15: 500 mg via INTRAVENOUS
  Filled 2020-06-15: qty 500

## 2020-06-15 MED ORDER — OXYCODONE-ACETAMINOPHEN 5-325 MG PO TABS
1.0000 | ORAL_TABLET | ORAL | Status: DC | PRN
  Administered 2020-06-15 – 2020-06-16 (×3): 1 via ORAL
  Filled 2020-06-15 (×3): qty 1

## 2020-06-15 MED ORDER — METHOCARBAMOL 500 MG IVPB - SIMPLE MED
500.0000 mg | Freq: Four times a day (QID) | INTRAVENOUS | Status: DC | PRN
  Filled 2020-06-15: qty 50

## 2020-06-15 MED ORDER — ONDANSETRON HCL 4 MG/2ML IJ SOLN
INTRAMUSCULAR | Status: AC
  Filled 2020-06-15: qty 2

## 2020-06-15 SURGICAL SUPPLY — 59 items
APL SKNCLS STERI-STRIP NONHPOA (GAUZE/BANDAGES/DRESSINGS) ×1
BAG SPEC THK2 15X12 ZIP CLS (MISCELLANEOUS) ×1
BAG ZIPLOCK 12X15 (MISCELLANEOUS) ×2 IMPLANT
BENZOIN TINCTURE PRP APPL 2/3 (GAUZE/BANDAGES/DRESSINGS) ×1 IMPLANT
BLADE EXTENDED COATED 6.5IN (ELECTRODE) ×2 IMPLANT
BLADE HEX COATED 2.75 (ELECTRODE) ×2 IMPLANT
BLADE SAG 18X100X1.27 (BLADE) ×2 IMPLANT
BLADE SAW SAG 73X25 THK (BLADE) ×1
BLADE SAW SGTL 73X25 THK (BLADE) ×1 IMPLANT
BRUSH FEMORAL CANAL (MISCELLANEOUS) IMPLANT
COVER SURGICAL LIGHT HANDLE (MISCELLANEOUS) ×2 IMPLANT
COVER WAND RF STERILE (DRAPES) IMPLANT
CUP ACET PINNACLE SECTR 48MM (Joint) IMPLANT
DRAPE HIP W/POCKET STRL (MISCELLANEOUS) ×2 IMPLANT
DRAPE INCISE IOBAN 66X45 STRL (DRAPES) ×2 IMPLANT
DRAPE POUCH INSTRU U-SHP 10X18 (DRAPES) ×2 IMPLANT
DRAPE U-SHAPE 47X51 STRL (DRAPES) ×2 IMPLANT
DRSG AQUACEL AG ADV 3.5X10 (GAUZE/BANDAGES/DRESSINGS) ×1 IMPLANT
DRSG MEPILEX BORDER 4X12 (GAUZE/BANDAGES/DRESSINGS) ×2 IMPLANT
DRSG MEPILEX BORDER 4X8 (GAUZE/BANDAGES/DRESSINGS) ×2 IMPLANT
DRSG PAD ABDOMINAL 8X10 ST (GAUZE/BANDAGES/DRESSINGS) ×2 IMPLANT
ELECT REM PT RETURN 15FT ADLT (MISCELLANEOUS) ×2 IMPLANT
EVACUATOR 1/8 PVC DRAIN (DRAIN) IMPLANT
FACESHIELD WRAPAROUND (MASK) ×6 IMPLANT
FACESHIELD WRAPAROUND OR TEAM (MASK) ×3 IMPLANT
GAUZE SPONGE 4X4 12PLY STRL (GAUZE/BANDAGES/DRESSINGS) IMPLANT
GAUZE XEROFORM 1X8 LF (GAUZE/BANDAGES/DRESSINGS) ×1 IMPLANT
GLOVE BIO SURGEON STRL SZ7.5 (GLOVE) ×2 IMPLANT
GLOVE BIOGEL PI IND STRL 8 (GLOVE) ×1 IMPLANT
GLOVE BIOGEL PI INDICATOR 8 (GLOVE) ×1
GLOVE ECLIPSE 8.0 STRL XLNG CF (GLOVE) ×2 IMPLANT
GOWN STRL REUS W/TWL XL LVL3 (GOWN DISPOSABLE) ×2 IMPLANT
HANDPIECE INTERPULSE COAX TIP (DISPOSABLE) ×2
HEAD FEM STD 32X+1 STRL (Hips) ×1 IMPLANT
IMMOBILIZER KNEE 20 (SOFTGOODS)
IMMOBILIZER KNEE 20 THIGH 36 (SOFTGOODS) IMPLANT
KIT TURNOVER KIT A (KITS) IMPLANT
LINER ACET 32X48 (Liner) ×1 IMPLANT
NDL MA TROC 1/2 (NEEDLE) IMPLANT
NEEDLE MA TROC 1/2 (NEEDLE) IMPLANT
PACK TOTAL JOINT (CUSTOM PROCEDURE TRAY) ×2 IMPLANT
PASSER SUT SWANSON 36MM LOOP (INSTRUMENTS) IMPLANT
PENCIL SMOKE EVACUATOR (MISCELLANEOUS) IMPLANT
PINNSECTOR W/GRIP ACE CUP 48MM (Joint) ×2 IMPLANT
PROTECTOR NERVE ULNAR (MISCELLANEOUS) ×2 IMPLANT
SET HNDPC FAN SPRY TIP SCT (DISPOSABLE) ×1 IMPLANT
SPONGE LAP 18X18 RF (DISPOSABLE) IMPLANT
SPONGE LAP 4X18 RFD (DISPOSABLE) IMPLANT
STAPLER VISISTAT 35W (STAPLE) IMPLANT
STEM CORAIL KA12 (Stem) ×1 IMPLANT
SUT ETHIBOND NAB CT1 #1 30IN (SUTURE) ×4 IMPLANT
SUT FIBERWIRE #2 38 T-5 BLUE (SUTURE) ×4
SUT VIC AB 1 CT1 36 (SUTURE) ×4 IMPLANT
SUT VIC AB 2-0 CT1 27 (SUTURE) ×4
SUT VIC AB 2-0 CT1 TAPERPNT 27 (SUTURE) ×2 IMPLANT
SUTURE FIBERWR #2 38 T-5 BLUE (SUTURE) ×2 IMPLANT
TOWEL OR 17X26 10 PK STRL BLUE (TOWEL DISPOSABLE) ×6 IMPLANT
TOWER CARTRIDGE SMART MIX (DISPOSABLE) IMPLANT
TRAY FOLEY MTR SLVR 16FR STAT (SET/KITS/TRAYS/PACK) ×2 IMPLANT

## 2020-06-15 NOTE — Brief Op Note (Signed)
06/14/2020 - 06/15/2020  4:51 PM  PATIENT:  Jane Andrews  86 y.o. female  PRE-OPERATIVE DIAGNOSIS:  RIGHT HIP FRACTURE  POST-OPERATIVE DIAGNOSIS:  RIGHT HIP FRACTURE  PROCEDURE: Right Total Hip Arthroplasty - Direct Anterior  SURGEON:  Surgeon(s) and Role:    Mcarthur Rossetti, MD - Primary  ASSISTANTS:  Vicki Mallet, RNFA   ANESTHESIA:   spinal  EBL:  300 mL   COUNTS:  YES  DICTATION: .Other Dictation: Dictation Number [id number]  PLAN OF CARE: Admit to inpatient   PATIENT DISPOSITION:  PACU - hemodynamically stable.   Delay start of Pharmacological VTE agent (>24hrs) due to surgical blood loss or risk of bleeding: no

## 2020-06-15 NOTE — Progress Notes (Signed)
PROGRESS NOTE    Jane Andrews  YTK:[id number] DOB: 1925-03-07 DOA: 06/14/2020 PCP: Redmond School, MD  Brief Narrative:  86 year old black female HTN, hypothyroid, reflux, Breast cancer status post mastectomy Paroxysmal A. fib not on anticoagulation secondary to prior GI bleed/normocytic anemia Diastolic dysfunction on echo 12/26/2018 Bipolar CKD 2-3 History of short gut syndrome with bowel adhesions in the past and SBO in 2013  Sustained a accidental fall 06/14/2020-found to have right hip fracture-transferred from [facility] under hospitalist service to [facility] long for definitive management of hip fracture by Dr. Ninfa Linden  Assessment & Plan:   Principal Problem:   Closed right hip fracture [facility]) Active Problems:   Hypertension   CKD (chronic kidney disease) stage 3, GFR 30-59 ml/min (HCC)   Hypothyroid   Atrial fibrillation (HCC)   IDA (iron deficiency anemia)   1. Right hip fracture a. Deferring to orthopedics plan of care b. Because the patient has advanced age that is her main risk factor-she is currently in sinus rhythm on monitor on exam and from my perspective she is at moderate risk but is cleared for any type of surgery 2. AKI on admission 3. Hypo kalemia a. Stop fluids containing potassium 7/2 as potassium is improved b. Maintenance fluids at fifty c. Recheck labs a.m. d. Keep Foley 4. Syncope with negative CT head a. Mechanism of fall unclear but she is coherent cognizant b. Hematoma to be evaluated going forward-not on any anticoagulation 5. Mild leukocytosis on admission a. Unclear if this is from the stress of the fall? b. Close watch on temperature curve c. No antibiotics at this time 6. A. fib CHADS2 score >4 followed by Dr. Domenic Polite not on anticoagulation secondary to prior anemia/GI bleed a. Stable on monitors-if no arrhythmias overnight would DC b. Mainly in sinus rhythm per review 7. Bipolar a. Continuing Xanax one nightly sleep,  Cymbalta 6090 mg total (high dose) b. Primidone fifty at bedtime sleep 8. Moderate malnutrition 9. Prior SBO/short gut syndrome a. Circumstances unclear continue B12 10. Compensated diastolic dysfunction a. Not on diuretics at this time continue Lopressor specialized dosing twenty-five twice daily or 37.5 as as needed  DVT prophylaxis:  Code Status: Full confirmed at the bedside Family Communication: Discussed with niece POA [name] at bedside Disposition:   Status is: Inpatient  Remains inpatient appropriate because:Ongoing active pain requiring inpatient pain management and Unsafe d/c plan   Dispo: The patient is from: Home              Anticipated d/c is to: SNF              Anticipated d/c date is: 3 days              Patient currently is not medically stable to d/c.       Consultants:   Orthopedics none  Procedures: None  Antimicrobials: None   Subjective: 7/10 pain No fever no chills No nausea no vomiting Awake alert coherent no distress Vitals:   06/15/20 0047 06/15/20 0530 06/15/20 0534 06/15/20 0749  BP: (!) 104/53  (!) 120/59 (!) 116/55  Pulse: 75  76 80  Resp: 18  20 16   Temp: 98.1 F (36.7 C)  98.4 F (36.9 C) 98.6 F (37 C)  TempSrc: Oral  Oral Oral  SpO2: 92% (!) 88% 94% 90%  Weight:      Height:        Intake/Output Summary (Last 24 hours) at 06/15/2020 0815 Last data filed at 06/15/2020  0652 Gross per 24 hour  Intake 832.11 ml  Output 700 ml  Net 132.11 ml   Filed Weights   06/14/20 1116  Weight: 54.4 kg    Examination:  General exam: Awake alert coherent no distress can engage in tell me where she is Respiratory system: Clear Cardiovascular system: S1-S2 no murmur rub or gallop Gastrointestinal system: Soft nontender no rebound no guarding. Central nervous system: Right hip externally rotated some stasis Extremities: Did not examine toes she does have some senile changes to her skin on lower extremities Skin: As above Psychiatry:  Euthymic congruent  Data Reviewed: I have personally reviewed following labs and imaging studies BUN/creatinine up from 33/1.6 on admission to 35/1.91 White count 12.8 down from 13.4 Hemoglobin 10.4 platelet 174  Radiology Studies: DG Shoulder Right  Result Date: 06/14/2020 CLINICAL DATA:  Right shoulder pain after a fall today. EXAM: RIGHT SHOULDER - 2+ VIEW COMPARISON:  06/24/2008 FINDINGS: No acute fracture or dislocation is identified. Degenerative changes are again noted at the greater tuberosity with subchondral cyst formation. Surgical clips are present in the axilla. IMPRESSION: No acute osseous abnormality. Electronically Signed   By: Logan Bores M.D.   On: 06/14/2020 14:10   DG Elbow Complete Right  Result Date: 06/14/2020 CLINICAL DATA:  Right elbow pain after fall today. EXAM: RIGHT ELBOW - COMPLETE 3+ VIEW COMPARISON:  None. FINDINGS: There is no evidence of fracture, dislocation, or joint effusion. There is no evidence of arthropathy or other focal bone abnormality. Soft tissues are unremarkable. IMPRESSION: Negative. Electronically Signed   By: Marijo Conception M.D.   On: 06/14/2020 14:10   CT Head Wo Contrast  Result Date: 06/14/2020 CLINICAL DATA:  Headache following fall EXAM: CT HEAD WITHOUT CONTRAST TECHNIQUE: Contiguous axial images were obtained from the base of the skull through the vertex without intravenous contrast. COMPARISON:  July 21, 2008 FINDINGS: Brain: Age related volume loss present. There is no intracranial mass, hemorrhage, extra-axial fluid collection, or midline shift. There is patchy small vessel disease in the centra semiovale bilaterally. No acute appearing infarct evident. Vascular: No hyperdense vessel. There is calcification in each carotid siphon region. Skull: The bony calvarium appears intact. There is diffuse apparent edema in the scalp muscles overlying the right temporal and posterior frontal bone regions. No associated mass in this area on  noncontrast enhanced study. Sinuses/Orbits: Mucosal thickening noted in several ethmoid air cells. Other paranasal sinuses are clear. Orbits appear symmetric bilaterally. Other: Mastoid air cells are clear. IMPRESSION: 1. Enlargement of the scalp muscles overlying the posterior frontal and temporal bones on the right. Question hematoma in these areas. Clinical assessment advised in this regard. Underlying bone appears intact. 2. Age related volume loss with patchy periventricular small vessel disease. No acute infarct. No mass or hemorrhage. 3.  There are foci of arterial vascular calcification. 4.  Mucosal thickening noted in several ethmoid air cells. Electronically Signed   By: Lowella Grip III M.D.   On: 06/14/2020 13:48   DG CHEST PORT 1 VIEW  Result Date: 06/14/2020 CLINICAL DATA:  Preoperative exam, closed right hip fracture EXAM: PORTABLE CHEST 1 VIEW COMPARISON:  CT 08/24/2017, radiograph 12/25/2018 FINDINGS: There is focal opacity in the right infrahilar lung, partially silhouetting portion of the right heart border, could reflect volume loss or consolidation in the right middle lobe, additional right lower lobe consolidation or atelectasis may be present as well. No pneumothorax or effusion. The cardiomediastinal contours are unremarkable. No acute osseous or soft tissue  abnormality. Degenerative changes are present in the imaged spine and shoulders. Telemetry leads overlie the chest. Surgical clips in the right axilla. IMPRESSION: Focal opacity in the right infrahilar lung silhouetting portion of the right heart border, could reflect volume loss or consolidation in the right middle lobe and possibly right lower lobe. Electronically Signed   By: Lovena Le M.D.   On: 06/14/2020 20:12   DG Hip Unilat W or Wo Pelvis 2-3 Views Right  Result Date: 06/14/2020 CLINICAL DATA:  Right hip pain after fall today. EXAM: DG HIP (WITH OR WITHOUT PELVIS) 2-3V RIGHT COMPARISON:  None. FINDINGS: Moderately  displaced fracture is seen involving proximal right femoral neck. Left hip is unremarkable. IMPRESSION: Moderately displaced proximal right femoral neck fracture. Electronically Signed   By: Marijo Conception M.D.   On: 06/14/2020 14:11     Scheduled Meds: . Chlorhexidine Gluconate Cloth  6 each Topical Daily  . DULoxetine  90 mg Oral q morning - 10a  . levothyroxine  75 mcg Oral QAC breakfast  . mouth rinse  15 mL Mouth Rinse BID  . metoprolol tartrate  25 mg Oral BID  . mupirocin ointment  1 application Nasal BID  . pantoprazole  40 mg Oral BID  . primidone  50 mg Oral QHS   Continuous Infusions: . sodium chloride       LOS: 1 day    Time spent: [place], MD [facility] To contact the attending provider between 7A-7P or the covering provider during after hours 7P-7A, please log into the web site [email or web address] and access using universal [place] password for that web site. If you do not have the password, please call the hospital operator.  06/15/2020, 8:15 AM

## 2020-06-15 NOTE — Anesthesia Procedure Notes (Signed)
Spinal  Patient location during procedure: OR Start time: 06/15/2020 3:30 PM End time: 06/15/2020 3:40 PM Staffing Performed: anesthesiologist  Anesthesiologist: Freddrick March, MD Preanesthetic Checklist Completed: patient identified, IV checked, risks and benefits discussed, surgical consent, monitors and equipment checked, pre-op evaluation and timeout performed Spinal Block Patient position: sitting Prep: DuraPrep and site prepped and draped Patient monitoring: cardiac monitor, continuous pulse ox and blood pressure Approach: midline Location: L3-4 Injection technique: single-shot Needle Needle type: Pencan  Needle gauge: 24 G Needle length: 9 cm Assessment Sensory level: T6 Additional Notes Functioning IV was confirmed and monitors were applied. Sterile prep and drape, including hand hygiene and sterile gloves were used. The patient was positioned and the spine was prepped. The skin was anesthetized with lidocaine.  Free flow of clear CSF was obtained prior to injecting local anesthetic into the CSF.  The spinal needle aspirated freely following injection.  The needle was carefully withdrawn.  The patient tolerated the procedure well.

## 2020-06-15 NOTE — Transfer of Care (Signed)
Immediate Anesthesia Transfer of Care Note  Patient: Jane Andrews  Procedure(s) Performed: ANTERIOR ARTHROPLASTY BIPOLAR HIP (HEMIARTHROPLASTY) (Right Hip)  Patient Location: PACU  Anesthesia Type:Spinal  Level of Consciousness: sedated  Airway & Oxygen Therapy: Patient Spontanous Breathing and Patient connected to face mask oxygen  Post-op Assessment: Report given to RN and Post -op Vital signs reviewed and stable  Post vital signs: Reviewed and stable  Last Vitals:  Vitals Value Taken Time  BP    Temp 36.6 C 06/15/20 1735  Pulse 85 06/15/20 1741  Resp 15 06/15/20 1741  SpO2 96 % 06/15/20 1741  Vitals shown include unvalidated device data.  Last Pain:  Vitals:   06/15/20 1459  TempSrc:   PainSc: 7       Patients Stated Pain Goal: 2 (73/22/02 5427)  Complications: No complications documented.

## 2020-06-15 NOTE — Plan of Care (Signed)
  Problem: Pain Management: Goal: Pain level will decrease with appropriate interventions Outcome: Progressing   

## 2020-06-15 NOTE — Progress Notes (Signed)
  Echocardiogram 2D Echocardiogram has been performed.  Jannett Celestine 06/15/2020, 11:56 AM

## 2020-06-15 NOTE — TOC Progression Note (Signed)
Transition of Care [facility]) - Progression Note    Patient Details  Name: Jane Andrews MRN: [id number] Date of Birth: 07-03-1925  Transition of Care [facility]) CM/SW Contact  Shade Flood, LCSW Phone Number: 06/15/2020, 3:53 PM  Clinical Narrative:     Spoke with pt's niece to provide bed offers. Niece and pt select [place]. Updated Kerri at [facility] and she states that they can accept pt on Sunday if pt medically ready for dc. TOC will follow.  Expected Discharge Plan: [place] Barriers to Discharge: Continued Medical Work up  Expected Discharge Plan and Services Expected Discharge Plan: [place] In-house Referral: Clinical Social Work   Post Acute Care Choice: [place] Living arrangements for the past 2 months: Single Family Home                                       Social Determinants of Health (SDOH) Interventions    Readmission Risk Interventions Readmission Risk Prevention Plan 06/15/2020  Medication Screening Complete  Transportation Screening Complete  Some recent data might be hidden

## 2020-06-15 NOTE — Anesthesia Preprocedure Evaluation (Addendum)
Anesthesia Evaluation  Patient identified by MRN, date of birth, ID band Patient awake    Reviewed: Allergy & Precautions, NPO status , Patient's Chart, lab work & pertinent test results, reviewed documented beta blocker date and time   Airway Mallampati: III  TM Distance: >3 FB Neck ROM: Full  Mouth opening: Limited Mouth Opening  Dental no notable dental hx. (+) Teeth Intact, Dental Advisory Given   Pulmonary neg pulmonary ROS, former smoker,    Pulmonary exam normal breath sounds clear to auscultation       Cardiovascular hypertension, Pt. on home beta blockers and Pt. on medications Normal cardiovascular exam+ dysrhythmias (not on anticoagulation) Atrial Fibrillation  Rhythm:Regular Rate:Normal     Neuro/Psych negative neurological ROS  negative psych ROS   GI/Hepatic Neg liver ROS, GERD  ,  Endo/Other  Hypothyroidism   Renal/GU Renal InsufficiencyRenal disease (K 4.9, Cr 1.91)  negative genitourinary   Musculoskeletal negative musculoskeletal ROS (+)   Abdominal   Peds  Hematology  (+) Blood dyscrasia (Hgb 10.4), anemia ,   Anesthesia Other Findings   Reproductive/Obstetrics                            Anesthesia Physical Anesthesia Plan  ASA: III  Anesthesia Plan: Spinal   Post-op Pain Management:    Induction:   PONV Risk Score and Plan: 2 and Treatment may vary due to age or medical condition and Propofol infusion  Airway Management Planned: Natural Airway  Additional Equipment:   Intra-op Plan:   Post-operative Plan:   Informed Consent: I have reviewed the patients History and Physical, chart, labs and discussed the procedure including the risks, benefits and alternatives for the proposed anesthesia with the patient or authorized representative who has indicated his/her understanding and acceptance.     Dental advisory given  Plan Discussed with: CRNA  Anesthesia  Plan Comments:         Anesthesia Quick Evaluation

## 2020-06-15 NOTE — Anesthesia Postprocedure Evaluation (Signed)
Anesthesia Post Note  Patient: ZYKIA WALLA  Procedure(s) Performed: ANTERIOR ARTHROPLASTY BIPOLAR HIP (HEMIARTHROPLASTY) (Right Hip)     Patient location during evaluation: PACU Anesthesia Type: Spinal Level of consciousness: oriented and awake and alert Pain management: pain level controlled Vital Signs Assessment: post-procedure vital signs reviewed and stable Respiratory status: spontaneous breathing, respiratory function stable and patient connected to nasal cannula oxygen Cardiovascular status: blood pressure returned to baseline and stable Postop Assessment: no headache, no backache and no apparent nausea or vomiting Anesthetic complications: no   No complications documented.  Last Vitals:  Vitals:   06/15/20 1900 06/15/20 1959  BP:    Pulse: 96   Resp: (!) 22   Temp:  36.9 C  SpO2: 95%     Last Pain:  Vitals:   06/15/20 1959  TempSrc: Oral  PainSc:                  Yekaterina Escutia L Avianah Pellman

## 2020-06-15 NOTE — Progress Notes (Signed)
Assumed care of patient from E[place], RN. Agree with previous assessment. Will continue to monitor. 

## 2020-06-15 NOTE — Anesthesia Procedure Notes (Signed)
Date/Time: 06/15/2020 3:32 PM Performed by: Cynda Familia, CRNA Pre-anesthesia Checklist: Patient identified, Emergency Drugs available, Suction available, Patient being monitored and Timeout performed Patient Re-evaluated:Patient Re-evaluated prior to induction Oxygen Delivery Method: Simple face mask Placement Confirmation: positive ETCO2 and breath sounds checked- equal and bilateral Dental Injury: Teeth and Oropharynx as per pre-operative assessment

## 2020-06-15 NOTE — NC FL2 (Signed)
[place] LEVEL OF CARE SCREENING TOOL     IDENTIFICATION  Patient Name: Jane Andrews Birthdate: 05/08/1925 Sex: female Admission Date (Current Location): 06/14/2020  [facility] and [place] Number:  Herbalist and Address:  [facility],  [place] [address] [place]      Provider Number: [id number]  Attending Physician Name and Address:  Nita Sells, MD  Relative Name and Phone Number:       Current Level of Care: Hospital Recommended Level of Care: [place] Prior Approval Number:    Date Approved/Denied:   PASRR Number:    Discharge Plan: SNF    Current Diagnoses: Patient Active Problem List   Diagnosis Date Noted  . Closed right hip fracture ([place]) 06/14/2020  . IDA (iron deficiency anemia) 02/28/2020  . Atrial fibrillation with RVR ([place]) 12/25/2018  . GERD (gastroesophageal reflux disease) 12/25/2018  . Chronic cholecystitis s/p lap cholecystectomy 06/25/2018 06/18/2018  . Obstipation 04/12/2016  . Vagal reaction 04/12/2016  . Abdominal pain 09/19/2014  . 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 Ecchymoses, spontaneous 05/31/2013  . Hypothyroid   . Atrial fibrillation ([place])   . Breast carcinoma ([place])   . Malabsorption   . CKD (chronic kidney disease) stage 3, GFR 30-59 ml/min (HCC) 10/21/2012  . Anemia, normocytic normochromic 07/06/2012  . Chronic diarrhea 02/10/2012  . Hypertension 02/10/2012    Orientation RESPIRATION BLADDER Height & Weight     Self, Time, Situation, Place  O2 (see dc summary) Continent Weight: 120 lb (54.4 kg) Height:  5\' 1"  (154.9 cm)  BEHAVIORAL SYMPTOMS/MOOD NEUROLOGICAL BOWEL NUTRITION STATUS      Continent Diet (see dc summary)  AMBULATORY STATUS COMMUNICATION OF NEEDS Skin   Extensive Assist Verbally Surgical wounds                       Personal Care Assistance Level of Assistance  Bathing, Feeding, Dressing Bathing Assistance: Limited  assistance Feeding assistance: Independent Dressing Assistance: Limited assistance     Functional Limitations Info  Sight, Hearing, Speech Sight Info: Adequate Hearing Info: Adequate Speech Info: Adequate    SPECIAL CARE FACTORS FREQUENCY  PT (By licensed PT), OT (By licensed OT)     PT Frequency: 5x week OT Frequency: 3x week            Contractures Contractures Info: Not present    Additional Factors Info  Code Status, Allergies, Psychotropic Code Status Info: Full Allergies Info: Codeine Psychotropic Info: Xanax, Cymbalta         Current Medications (06/15/2020):  This is the current hospital active medication list Current Facility-Administered Medications  Medication Dose Route Frequency Provider Last Rate Last Admin  . 0.9 %  sodium chloride infusion   Intravenous Continuous Nita Sells, MD 50 mL/hr at 06/15/20 0911 New Bag at 06/15/20 0911  . acetaminophen (TYLENOL) tablet 650 mg  650 mg Oral Q6H PRN Emokpae, Ejiroghene E, MD       Or  . acetaminophen (TYLENOL) suppository 650 mg  650 mg Rectal Q6H PRN Emokpae, Ejiroghene E, MD      . ALPRAZolam Duanne Moron) tablet 1 mg  1 mg Oral QHS PRN Emokpae, Ejiroghene E, MD   1 mg at 06/14/20 2249  . Chlorhexidine Gluconate Cloth 2 % PADS 6 each  6 each Topical Daily Mcarthur Rossetti, MD   6 each at 06/15/20 0915  . DULoxetine (CYMBALTA) DR capsule 90 mg  90 mg Oral q  morning - 10a Emokpae, Ejiroghene E, MD   90 mg at 06/15/20 0909  . HYDROmorphone (DILAUDID) injection 0.5 mg  0.5 mg Intravenous Q4H PRN Emokpae, Ejiroghene E, MD   0.5 mg at 06/15/20 0718  . levothyroxine (SYNTHROID) tablet 75 mcg  75 mcg Oral QAC breakfast Emokpae, Ejiroghene E, MD   75 mcg at 06/15/20 0535  . MEDLINE mouth rinse  15 mL Mouth Rinse BID Emokpae, Ejiroghene E, MD   15 mL at 06/15/20 0914  . metoprolol tartrate (LOPRESSOR) tablet 25 mg  25 mg Oral BID Emokpae, Ejiroghene E, MD   25 mg at 06/15/20 0908  . mupirocin ointment (BACTROBAN)  2 % 1 application  1 application Nasal BID Emokpae, Ejiroghene E, MD   1 application at 35/00/93 0911  . ondansetron (ZOFRAN) tablet 4 mg  4 mg Oral Q6H PRN Emokpae, Ejiroghene E, MD       Or  . ondansetron (ZOFRAN) injection 4 mg  4 mg Intravenous Q6H PRN Emokpae, Ejiroghene E, MD      . oxyCODONE-acetaminophen (PERCOCET/ROXICET) 5-325 MG per tablet 1 tablet  1 tablet Oral Q4H PRN Nita Sells, MD   1 tablet at 06/15/20 0923  . pantoprazole (PROTONIX) EC tablet 40 mg  40 mg Oral BID Emokpae, Ejiroghene E, MD   40 mg at 06/15/20 0908  . polyethylene glycol (MIRALAX / GLYCOLAX) packet 17 g  17 g Oral Daily PRN Emokpae, Ejiroghene E, MD      . primidone (MYSOLINE) tablet 50 mg  50 mg Oral QHS Emokpae, Ejiroghene E, MD   50 mg at 06/14/20 2249     Discharge Medications: Please see discharge summary for a list of discharge medications.  Relevant Imaging Results:  Relevant Lab Results:   Additional Information SSN: [place] [address] [place]

## 2020-06-15 NOTE — TOC Initial Note (Addendum)
Transition of Care [facility]) - Initial/Assessment Note    Patient Details  Name: Jane Andrews MRN: [id number] Date of Birth: 1925-05-30  Transition of [facility]) CM/SW Contact:    Shade Flood, LCSW Phone Number: 06/15/2020, 10:22 AM  Clinical Narrative:                  Pt admitted with hip fx. Surgery scheduled for today. MD anticipating pt will need SNF rehab at dc. Met with pt and her niece today to discuss dc planning. Pt lives alone in her [place] home. She is normally independent in ADLs at home. Pt and niece aware that pt may need SNF rehab at dc. Discussed CMS provider options and will refer as requested. Will start PASRR as well.  TOC will follow.  Expected Discharge Plan: Skilled Nursing Facility Barriers to Discharge: Continued Medical Work up   Patient Goals and CMS Choice Patient states their goals for this hospitalization and ongoing recovery are:: get better CMS Medicare.gov Compare Post Acute Care list provided to:: Patient Represenative (must comment) Choice offered to / list presented to : Adult Children  Expected Discharge Plan and Services Expected Discharge Plan: [place] In-house Referral: Clinical Social Work   Post Acute Care Choice: [place] Living arrangements for the past 2 months: [place]                                      Prior Living Arrangements/Services Living arrangements for the past 2 months: Single Family Home Lives with:: Self Patient language and need for interpreter reviewed:: Yes Do you feel safe going back to the place where you live?: Yes      Need for Family Participation in Patient Care: No (Comment) Care giver support system in place?: Yes (comment)   Criminal Activity/Legal Involvement Pertinent to Current Situation/Hospitalization: No - Comment as needed  Activities of Daily Living Home Assistive Devices/Equipment: None ADL Screening (condition at time of  admission) Patient's cognitive ability adequate to safely complete daily activities?: Yes Is the patient deaf or have difficulty hearing?: No Does the patient have difficulty seeing, even when wearing glasses/contacts?: No Does the patient have difficulty concentrating, remembering, or making decisions?: No Patient able to express need for assistance with ADLs?: Yes Does the patient have difficulty dressing or bathing?: No Independently performs ADLs?: Yes (appropriate for developmental age) Does the patient have difficulty walking or climbing stairs?: Yes Weakness of Legs: Right Weakness of Arms/Hands: None  Permission Sought/Granted                  Emotional Assessment     Affect (typically observed): Quiet Orientation: : Oriented to Self, Oriented to Place, Oriented to  Time, Oriented to Situation Alcohol / Substance Use: Not Applicable Psych Involvement: No (comment)  Admission diagnosis:  Closed right hip fracture (HCC) [S72.001A] Closed fracture of neck of right femur, initial encounter ([place]) [S72.001A] Patient Active Problem List   Diagnosis Date Noted  . Closed right hip fracture ([place]) 06/14/2020  . IDA (iron deficiency anemia) 02/28/2020  . Atrial fibrillation with RVR ([place]) 12/25/2018  . GERD (gastroesophageal reflux disease) 12/25/2018  . Chronic cholecystitis s/p lap cholecystectomy 06/25/2018 06/18/2018  . Obstipation 04/12/2016  . Vagal reaction 04/12/2016  . Abdominal pain 09/19/2014  . 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 Ecchymoses, spontaneous 05/31/2013  . Hypothyroid   . Atrial fibrillation ([place])   .  Breast carcinoma ([place])   . Malabsorption   . CKD (chronic kidney disease) stage 3, GFR 30-59 ml/min (HCC) 10/21/2012  . Anemia, normocytic normochromic 07/06/2012  . Chronic diarrhea 02/10/2012  . Hypertension 02/10/2012   PCP:  Redmond School, MD Pharmacy:   [facility], [address] [place] [address] Phone: [phone] Fax: [phone]     Social Determinants of Health (SDOH) Interventions    Readmission Risk Interventions Readmission Risk Prevention Plan 06/15/2020  Medication Screening Complete  Transportation Screening Complete  Some recent data might be hidden

## 2020-06-15 NOTE — Progress Notes (Signed)
Patient's 02 saturations noted to be 88% on room air during routine vitals. Instructed patient on use of incentive spirometer with return demonstration. Patient's saturations rose to 94% on room air after use. Will continue to monitor and encourage deep breathing. Patient denies pain at this time.

## 2020-06-15 NOTE — Consult Note (Signed)
Reason for Consult:  Right hip fracture Referring Physician:  Forestine Na ED  Jane Andrews is an 86 y.o. female.  HPI: The patient is a 86 year old female who presented to the [facility] emergency room yesterday after having a mechanical fall.  She is uncertain of how she fell.  She ended up sustaining a displaced right hip femoral neck fracture.  Orthopedic surgery was consulted.  Since there is no coverage for orthopedics at [facility] she was transferred to the [facility] system.  I was asked to resume her orthopedic care by one of my colleagues.  She has been admitted to the hospitalist service and is receiving medical clearance for surgery today.  I talked with the patient in length and in detail as well as her niece at the bedside.  Prior to this mechanical fall she had no issues with the right hip and she does not ambulate with an assistive device.  Past Medical History:  Diagnosis Date  . Abdominal adhesions 1994  . Allergic rhinitis   . Anemia   . Anxiety and depression   . Aortic stenosis   . Arthritis   . Atrial fibrillation ([place]) 10/2012   Associated with severe anemia and esophageal pill impaction  . Breast carcinoma (HCC)    Right mastectomy  . Cholelithiasis   . Essential hypertension   . Gastroesophageal reflux disease    Hiatal hernia  . History of blood transfusion   . Hypothyroidism   . Low back pain   . 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 Nephrolithiasis 2004   Painless hematuria  . Short gut syndrome    Bowel resection , 2004  . Upper GI bleed 2004   Multiple episodes of melena-? due to gastritis or adverse drug effect (nonsteroidals, small bowel ulceration with Fosamax); caused by Pepto-Bismol during one Emergency Department evaluation    Past Surgical History:  Procedure Laterality Date  . ABDOMINAL HYSTERECTOMY     emergency s/p delivery  . ABDOMINAL HYSTERECTOMY  1960   massive gynecologic bleeding   . BOWEL RESECTION     Resulting short gut syndrome  . CHOLECYSTECTOMY N/A 06/25/2018   Procedure: LAPAROSCOPIC CHOLECYSTECTOMY WITH INTRAOPERATIVE CHOLANGIOGRAM;  Surgeon: Armandina Gemma, MD;  Location: [facility];  Service: General;  Laterality: N/A;  . COLONOSCOPY W/ POLYPECTOMY  2005   Lipoma; diverticulosis  . COLONOSCOPY WITH ESOPHAGOGASTRODUODENOSCOPY (EGD)  11/22/2012   Rehman  . LAPAROSCOPIC LYSIS OF ADHESIONS  1965   s/p adhesions  . MASTECTOMY  right breast  . MASTECTOMY     Carcinoma of the breast; right  . UPPER GASTROINTESTINAL ENDOSCOPY      Family History  Problem Relation Age of Onset  . Anuerysm Father   . Rheum arthritis Sister   . Healthy Sister   . COPD Sister   . Healthy Brother   . Cancer Other   . Colon cancer Neg Hx     Social History:  reports that she has quit smoking. Her smoking use included cigarettes. She has a 30.00 pack-year smoking history. She has never used smokeless tobacco. She reports that she does not drink alcohol and does not use drugs.  Allergies:  Allergies  Allergen Reactions  . Codeine Nausea And Vomiting    Medications: I have reviewed the patient's current medications.  Results for orders placed or performed during the hospital encounter of 06/14/20 (from the past 48 hour(s))  Basic metabolic panel  Status: Abnormal   Collection Time: 06/14/20  1:13 PM  Result Value Ref Range   Sodium 136 135 - 145 mmol/L   Potassium 3.6 3.5 - 5.1 mmol/L   Chloride 101 98 - 111 mmol/L   CO2 21 (L) 22 - 32 mmol/L   Glucose, Bld 129 (H) 70 - 99 mg/dL    Comment: Glucose reference range applies only to samples taken after fasting for at least 8 hours.   BUN 33 (H) 8 - 23 mg/dL   Creatinine, Ser 1.62 (H) 0.44 - 1.00 mg/dL   Calcium 9.7 8.9 - 10.3 mg/dL   GFR calc non Af Amer 27 (L) >60 mL/min   GFR calc Af Amer 32 (L) >60 mL/min   Anion gap 14 5 - 15    Comment: Performed at [facility], [address], [place], [place] [address]  CBC with  Differential     Status: Abnormal   Collection Time: 06/14/20  1:13 PM  Result Value Ref Range   WBC 13.4 (H) 4.0 - 10.5 K/uL   RBC 3.43 (L) 3.87 - 5.11 MIL/uL   Hemoglobin 10.2 (L) 12.0 - 15.0 g/dL   HCT 31.8 (L) 36 - 46 %   MCV 92.7 80.0 - 100.0 fL   MCH 29.7 26.0 - 34.0 pg   MCHC 32.1 30.0 - 36.0 g/dL   RDW 15.0 11.5 - 15.5 %   Platelets 163 150 - 400 K/uL   nRBC 0.0 0.0 - 0.2 %   Neutrophils Relative % 87 %   Neutro Abs 11.7 (H) 1.7 - 7.7 K/uL   Lymphocytes Relative 5 %   Lymphs Abs 0.7 0.7 - 4.0 K/uL   Monocytes Relative 6 %   Monocytes Absolute 0.8 0 - 1 K/uL   Eosinophils Relative 1 %   Eosinophils Absolute 0.1 0 - 0 K/uL   Basophils Relative 0 %   Basophils Absolute 0.0 0 - 0 K/uL   Immature Granulocytes 1 %   Abs Immature Granulocytes 0.09 (H) 0.00 - 0.07 K/uL    Comment: Performed at [facility], [address], [place], [place] [address]  SARS Coronavirus 2 by RT PCR (hospital order, performed in [facility] hospital lab) Nasopharyngeal Nasopharyngeal Swab     Status: None   Collection Time: 06/14/20  2:35 PM   Specimen: Nasopharyngeal Swab  Result Value Ref Range   SARS Coronavirus 2 NEGATIVE NEGATIVE    Comment: (NOTE) SARS-CoV-2 target nucleic acids are NOT DETECTED.  The SARS-CoV-2 RNA is generally detectable in upper and lower respiratory specimens during the acute phase of infection. The lowest concentration of SARS-CoV-2 viral copies this assay can detect is 250 copies / mL. A negative result does not preclude SARS-CoV-2 infection and should not be used as the sole basis for treatment or other patient management decisions.  A negative result may occur with improper specimen collection / handling, submission of specimen other than nasopharyngeal swab, presence of viral mutation(s) within the areas targeted by this assay, and inadequate number of viral copies (<250 copies / mL). A negative result must be combined with clinical observations, patient history,  and epidemiological information.  Fact Sheet for Patients:   StrictlyIdeas.no  Fact Sheet for Healthcare Providers: BankingDealers.co.za  This test is not yet approved or  cleared by the [place] FDA and has been authorized for detection and/or diagnosis of SARS-CoV-2 by FDA under an Emergency Use Authorization (EUA).  This EUA will remain in effect (meaning this test can be  used) for the duration of the COVID-19 declaration under Section 564(b)(1) of the Act, 21 U.S.C. section 360bbb-3(b)(1), unless the authorization is terminated or revoked sooner.  Performed at [facility], [address], [place], [place] [address]   TSH     Status: None   Collection Time: 06/14/20  8:37 PM  Result Value Ref Range   TSH 1.312 0.350 - 4.500 uIU/mL    Comment: Performed by a 3rd Generation assay with a functional sensitivity of <=0.01 uIU/mL. Performed at [facility], [place] [address], [place], [place] [address]   Magnesium     Status: None   Collection Time: 06/14/20  8:37 PM  Result Value Ref Range   Magnesium 1.9 1.7 - 2.4 mg/dL    Comment: Performed at [facility], [place] [address] [place], [place] [address]  Troponin I (High Sensitivity)     Status: Abnormal   Collection Time: 06/14/20  8:37 PM  Result Value Ref Range   Troponin I (High Sensitivity) 125 (HH) <18 ng/L    Comment: CRITICAL RESULT CALLED TO, READ BACK BY AND VERIFIED WITH: ENE, RN @ 2119 ON 06/14/20 C VARNER (NOTE) Elevated high sensitivity troponin I (hsTnI) values and significant  changes across serial measurements may suggest ACS but many other  chronic and acute conditions are known to elevate hsTnI results.  Refer to the Links section for chest pain algorithms and additional  guidance. Performed at [facility], [place] [address], [place], [place] [address]   CBC     Status: Abnormal   Collection Time: 06/14/20   8:37 PM  Result Value Ref Range   WBC 12.8 (H) 4.0 - 10.5 K/uL   RBC 3.48 (L) 3.87 - 5.11 MIL/uL   Hemoglobin 10.4 (L) 12.0 - 15.0 g/dL   HCT 31.9 (L) 36 - 46 %   MCV 91.7 80.0 - 100.0 fL   MCH 29.9 26.0 - 34.0 pg   MCHC 32.6 30.0 - 36.0 g/dL   RDW 14.6 11.5 - 15.5 %   Platelets 174 150 - 400 K/uL   nRBC 0.0 0.0 - 0.2 %    Comment: Performed at [facility], [place] [address] [place],  [address]  Basic metabolic panel     Status: Abnormal   Collection Time: 06/15/20  5:03 AM  Result Value Ref Range   Sodium 136 135 - 145 mmol/L   Potassium 4.9 3.5 - 5.1 mmol/L   Chloride 103 98 - 111 mmol/L   CO2 25 22 - 32 mmol/L   Glucose, Bld 118 (H) 70 - 99 mg/dL    Comment: Glucose reference range applies only to samples taken after fasting for at least 8 hours.   BUN 35 (H) 8 - 23 mg/dL   Creatinine, Ser 1.91 (H) 0.44 - 1.00 mg/dL   Calcium 8.9 8.9 - 10.3 mg/dL   GFR calc non Af Amer 22 (L) >60 mL/min   GFR calc Af Amer 26 (L) >60 mL/min   Anion gap 8 5 - 15    Comment: Performed at [facility], [place] [address] [place],  [address]  Surgical PCR screen     Status: None   Collection Time: 06/15/20  9:36 AM   Specimen: Nasal Mucosa; Nasal Swab  Result Value Ref Range   MRSA, PCR NEGATIVE NEGATIVE   Staphylococcus aureus NEGATIVE NEGATIVE    Comment: (NOTE) The Xpert SA Assay (FDA approved for NASAL specimens in patients 22  years of age and older), is one component of a comprehensive surveillance program. It is not intended to diagnose infection nor to guide or monitor treatment. Performed at [facility], [place] [address] [place], [place] [address]   Type and screen [place]     Status: None   Collection Time: 06/15/20  2:29 PM  Result Value Ref Range   ABO/RH(D) O POS    Antibody Screen NEG    Sample Expiration      06/18/2020,2359 Performed at [facility], [place]  [address], [place], [place] [address]     DG Shoulder Right  Result Date: 06/14/2020 CLINICAL DATA:  Right shoulder pain after a fall today. EXAM: RIGHT SHOULDER - 2+ VIEW COMPARISON:  06/24/2008 FINDINGS: No acute fracture or dislocation is identified. Degenerative changes are again noted at the greater tuberosity with subchondral cyst formation. Surgical clips are present in the axilla. IMPRESSION: No acute osseous abnormality. Electronically Signed   By: Logan Bores M.D.   On: 06/14/2020 14:10   DG Elbow Complete Right  Result Date: 06/14/2020 CLINICAL DATA:  Right elbow pain after fall today. EXAM: RIGHT ELBOW - COMPLETE 3+ VIEW COMPARISON:  None. FINDINGS: There is no evidence of fracture, dislocation, or joint effusion. There is no evidence of arthropathy or other focal bone abnormality. Soft tissues are unremarkable. IMPRESSION: Negative. Electronically Signed   By: Marijo Conception M.D.   On: 06/14/2020 14:10   CT Head Wo Contrast  Result Date: 06/14/2020 CLINICAL DATA:  Headache following fall EXAM: CT HEAD WITHOUT CONTRAST TECHNIQUE: Contiguous axial images were obtained from the base of the skull through the vertex without intravenous contrast. COMPARISON:  July 21, 2008 FINDINGS: Brain: Age related volume loss present. There is no intracranial mass, hemorrhage, extra-axial fluid collection, or midline shift. There is patchy small vessel disease in the centra semiovale bilaterally. No acute appearing infarct evident. Vascular: No hyperdense vessel. There is calcification in each carotid siphon region. Skull: The bony calvarium appears intact. There is diffuse apparent edema in the scalp muscles overlying the right temporal and posterior frontal bone regions. No associated mass in this area on noncontrast enhanced study. Sinuses/Orbits: Mucosal thickening noted in several ethmoid air cells. Other paranasal sinuses are clear. Orbits appear symmetric bilaterally. Other: Mastoid air cells are clear.  IMPRESSION: 1. Enlargement of the scalp muscles overlying the posterior frontal and temporal bones on the right. Question hematoma in these areas. Clinical assessment advised in this regard. Underlying bone appears intact. 2. Age related volume loss with patchy periventricular small vessel disease. No acute infarct. No mass or hemorrhage. 3.  There are foci of arterial vascular calcification. 4.  Mucosal thickening noted in several ethmoid air cells. Electronically Signed   By: Lowella Grip III M.D.   On: 06/14/2020 13:48   DG CHEST PORT 1 VIEW  Result Date: 06/14/2020 CLINICAL DATA:  Preoperative exam, closed right hip fracture EXAM: PORTABLE CHEST 1 VIEW COMPARISON:  CT 08/24/2017, radiograph 12/25/2018 FINDINGS: There is focal opacity in the right infrahilar lung, partially silhouetting portion of the right heart border, could reflect volume loss or consolidation in the right middle lobe, additional right lower lobe consolidation or atelectasis may be present as well. No pneumothorax or effusion. The cardiomediastinal contours are unremarkable. No acute osseous or soft tissue abnormality. Degenerative changes are present in the imaged spine and shoulders. Telemetry leads overlie the chest. Surgical clips in the right axilla. IMPRESSION: Focal opacity in the  right infrahilar lung silhouetting portion of the right heart border, could reflect volume loss or consolidation in the right middle lobe and possibly right lower lobe. Electronically Signed   By: Lovena Le M.D.   On: 06/14/2020 20:12   ECHOCARDIOGRAM COMPLETE  Result Date: 06/15/2020    ECHOCARDIOGRAM REPORT   Patient Name:   Jane Andrews Date of Exam: 06/15/2020 Medical Rec #:  [id number]           Height:       61.0 in Accession #:    [id number]          Weight:       120.0 lb Date of Birth:  1925/03/30           BSA:          1.520 m Patient Age:    55 years            BP:           116/55 mmHg Patient Gender: F                   HR:            80 bpm. Exam Location:  Inpatient Procedure: 2D Echo Indications:    780.2 syncope  History:        Patient has prior history of Echocardiogram examinations, most                 recent 12/26/2018. Arrythmias:Atrial Fibrillation; Risk                 Factors:Hypertension and Former Smoker. AS.  Sonographer:    Jannett Celestine RDCS (AE) Referring Phys: [place]  1. Left ventricular ejection fraction, by estimation, is 60 to 65%. The left ventricle has normal function. The left ventricle has no regional wall motion abnormalities. There is mild asymmetric left ventricular hypertrophy of the basal-septal segment. Left ventricular diastolic parameters are consistent with Grade I diastolic dysfunction (impaired relaxation).  2. Right ventricular systolic function is normal. The right ventricular size is not well visualized. Tricuspid regurgitation signal is inadequate for assessing PA pressure.  3. The mitral valve is normal in structure. Trivial mitral valve regurgitation. No evidence of mitral stenosis.  4. The aortic valve is abnormal. Aortic valve regurgitation is not visualized. Mild aortic valve stenosis. Aortic valve mean gradient measures 12.0 mmHg.  5. The inferior vena cava is normal in size with <50% respiratory variability, suggesting right atrial pressure of 8 mmHg. FINDINGS  Left Ventricle: Left ventricular ejection fraction, by estimation, is 60 to 65%. The left ventricle has normal function. The left ventricle has no regional wall motion abnormalities. The left ventricular internal cavity size was normal in size. There is  mild asymmetric left ventricular hypertrophy of the basal-septal segment. Left ventricular diastolic parameters are consistent with Grade I diastolic dysfunction (impaired relaxation). Right Ventricle: The right ventricular size is not well visualized. Right vetricular wall thickness was not assessed. Right ventricular systolic function is normal. Tricuspid  regurgitation signal is inadequate for assessing PA pressure. Left Atrium: Left atrial size was normal in size. Right Atrium: Right atrial size was normal in size. Pericardium: There is no evidence of pericardial effusion. Mitral Valve: The mitral valve is normal in structure. There is moderate thickening of the mitral valve leaflet(s). Normal mobility of the mitral valve leaflets. Mild mitral annular calcification. Trivial mitral valve regurgitation. No evidence of mitral  valve stenosis. Tricuspid Valve: The  tricuspid valve is normal in structure. Tricuspid valve regurgitation is not demonstrated. No evidence of tricuspid stenosis. Aortic Valve: The aortic valve is abnormal. Aortic valve regurgitation is not visualized. Mild aortic stenosis is present. There is severe calcifcation of the aortic valve. Aortic valve mean gradient measures 12.0 mmHg. Aortic valve peak gradient measures 19.0 mmHg. Aortic valve area, by VTI measures 1.14 cm. Pulmonic Valve: The pulmonic valve was grossly normal. Pulmonic valve regurgitation is trivial. No evidence of pulmonic stenosis. Aorta: The aortic root is normal in size and structure. Venous: The inferior vena cava is normal in size with less than 50% respiratory variability, suggesting right atrial pressure of 8 mmHg. IAS/Shunts: The interatrial septum was not well visualized.  LEFT VENTRICLE PLAX 2D LVIDd:         3.40 cm  Diastology LVIDs:         2.30 cm  LV e' lateral:   7.51 cm/s LV PW:         1.00 cm  LV E/e' lateral: 10.9 LV IVS:        0.90 cm  LV e' medial:    8.49 cm/s LVOT diam:     2.00 cm  LV E/e' medial:  9.6 LV SV:         46 LV SV Index:   30 LVOT Area:     3.14 cm  LEFT ATRIUM             Index       RIGHT ATRIUM           Index LA diam:        3.20 cm 2.11 cm/m  RA Area:     11.10 cm LA Vol (A2C):   48.0 ml 31.58 ml/m RA Volume:   23.10 ml  15.20 ml/m LA Vol (A4C):   28.7 ml 18.88 ml/m LA Biplane Vol: 39.4 ml 25.92 ml/m  AORTIC VALVE AV Area (Vmax):     1.21 cm AV Area (Vmean):   1.29 cm AV Area (VTI):     1.14 cm AV Vmax:           218.00 cm/s AV Vmean:          148.500 cm/s AV VTI:            0.400 m AV Peak Grad:      19.0 mmHg AV Mean Grad:      12.0 mmHg LVOT Vmax:         84.00 cm/s LVOT Vmean:        61.100 cm/s LVOT VTI:          0.145 m LVOT/AV VTI ratio: 0.36  AORTA Ao Root diam: 3.50 cm MITRAL VALVE MV Area (PHT): 3.63 cm     SHUNTS MV Decel Time: 209 msec     Systemic VTI:  0.14 m MV E velocity: 81.50 cm/s   Systemic Diam: 2.00 cm MV A velocity: 106.00 cm/s MV E/A ratio:  0.77 Cherlynn Kaiser MD Electronically signed by Cherlynn Kaiser MD Signature Date/Time: 06/15/2020/3:23:30 PM    Final    DG Hip Unilat W or Wo Pelvis 2-3 Views Right  Result Date: 06/14/2020 CLINICAL DATA:  Right hip pain after fall today. EXAM: DG HIP (WITH OR WITHOUT PELVIS) 2-3V RIGHT COMPARISON:  None. FINDINGS: Moderately displaced fracture is seen involving proximal right femoral neck. Left hip is unremarkable. IMPRESSION: Moderately displaced proximal right femoral neck fracture. Electronically Signed   By: Bobbe Medico.D.  On: 06/14/2020 14:11    Review of Systems Blood pressure 137/70, pulse 93, temperature (!) 102.1 F (38.9 C), temperature source Oral, resp. rate 16, height 5\' 1"  (1.549 m), weight 54.4 kg, SpO2 (!) 89 %. Physical Exam Vitals reviewed.  Constitutional:      Appearance: Normal appearance.  HENT:     Head: Normocephalic and atraumatic.  Eyes:     Pupils: Pupils are equal, round, and reactive to light.  Cardiovascular:     Rate and Rhythm: Normal rate.  Pulmonary:     Effort: Pulmonary effort is normal.  Abdominal:     Palpations: Abdomen is soft.  Musculoskeletal:     Cervical back: Normal range of motion.     Right hip: Deformity, tenderness and bony tenderness present. Decreased range of motion. Decreased strength.  Neurological:     Mental Status: She is alert and oriented to person, place, and time.  Psychiatric:         Behavior: Behavior normal.     Assessment/Plan: Right hip femoral neck fracture  I talked with the patient and her niece in length.  I discussed operative and nonoperative treatment measures in detail.  My plan will be to proceed to surgery today for right total hip arthroplasty through direct anterior approach.  A long thorough discussion of the risk and benefits of surgery and informed consent is obtained.  Mcarthur Rossetti 06/15/2020, 4:56 PM

## 2020-06-15 NOTE — Progress Notes (Signed)
Informed by anesthesia patient's sats were slightly low during recovery on PACU and she had an oxygen need and would not wean I saw the patient at the bedside and discussed with her family She is still coming around but is oriented Can tell me where she lives can tell me her niece's name and where her niece lives When I take her off oxygen she goes from 99% to 84% She has no chest pain no fever no chills I will repeat a stat portable chest x-ray now and inform our night coverage to follow-she will need an I-S every 2 hourly as awake She may require antibiotics if we do find evidence of aspiration I have discussed fully the plan of care with her niece at the bedside who is her power of attorney  30 minutes prolonged time over and above earlier note

## 2020-06-15 NOTE — Progress Notes (Deleted)
Transition of Care (TOC) -30 day Note       Patient Details  Name: Alara Daniel MRN: [id number] Date of Birth: 1925/11/22   Transition of Care [facility]) CM/SW Contact  Name: Shade Flood Phone Number: [phone] Date: 06/15/2020 Time: 10:52   MUST ID: [id number]   To Whom it May Concern:   Please be advised that the above patient will require a short-term nursing home stay, anticipated 30 days or less rehabilitation and strengthening. The plan is for return home.

## 2020-06-16 LAB — CBC
HCT: 19.8 % — ABNORMAL LOW (ref 36.0–46.0)
Hemoglobin: 6.4 g/dL — CL (ref 12.0–15.0)
MCH: 29.8 pg (ref 26.0–34.0)
MCHC: 32.3 g/dL (ref 30.0–36.0)
MCV: 92.1 fL (ref 80.0–100.0)
Platelets: 86 10*3/uL — ABNORMAL LOW (ref 150–400)
RBC: 2.15 MIL/uL — ABNORMAL LOW (ref 3.87–5.11)
RDW: 14.8 % (ref 11.5–15.5)
WBC: 8 10*3/uL (ref 4.0–10.5)
nRBC: 0 % (ref 0.0–0.2)

## 2020-06-16 LAB — CBC WITH DIFFERENTIAL/PLATELET
Abs Immature Granulocytes: 0.03 10*3/uL (ref 0.00–0.07)
Basophils Absolute: 0 10*3/uL (ref 0.0–0.1)
Basophils Relative: 0 %
Eosinophils Absolute: 0.4 10*3/uL (ref 0.0–0.5)
Eosinophils Relative: 5 %
HCT: 28.5 % — ABNORMAL LOW (ref 36.0–46.0)
Hemoglobin: 9.4 g/dL — ABNORMAL LOW (ref 12.0–15.0)
Immature Granulocytes: 0 %
Lymphocytes Relative: 9 %
Lymphs Abs: 0.7 10*3/uL (ref 0.7–4.0)
MCH: 30.4 pg (ref 26.0–34.0)
MCHC: 33 g/dL (ref 30.0–36.0)
MCV: 92.2 fL (ref 80.0–100.0)
Monocytes Absolute: 0.8 10*3/uL (ref 0.1–1.0)
Monocytes Relative: 10 %
Neutro Abs: 6.2 10*3/uL (ref 1.7–7.7)
Neutrophils Relative %: 76 %
Platelets: 102 10*3/uL — ABNORMAL LOW (ref 150–400)
RBC: 3.09 MIL/uL — ABNORMAL LOW (ref 3.87–5.11)
RDW: 14.5 % (ref 11.5–15.5)
WBC: 8.2 10*3/uL (ref 4.0–10.5)
nRBC: 0 % (ref 0.0–0.2)

## 2020-06-16 LAB — PREPARE RBC (CROSSMATCH)

## 2020-06-16 LAB — BASIC METABOLIC PANEL
Anion gap: 7 (ref 5–15)
BUN: 27 mg/dL — ABNORMAL HIGH (ref 8–23)
CO2: 21 mmol/L — ABNORMAL LOW (ref 22–32)
Calcium: 7.3 mg/dL — ABNORMAL LOW (ref 8.9–10.3)
Chloride: 108 mmol/L (ref 98–111)
Creatinine, Ser: 1.83 mg/dL — ABNORMAL HIGH (ref 0.44–1.00)
GFR calc Af Amer: 27 mL/min — ABNORMAL LOW (ref >60)
GFR calc non Af Amer: 24 mL/min — ABNORMAL LOW (ref >60)
Glucose, Bld: 117 mg/dL — ABNORMAL HIGH (ref 70–99)
Potassium: 4.7 mmol/L (ref 3.5–5.1)
Sodium: 136 mmol/L (ref 135–145)

## 2020-06-16 MED ORDER — SODIUM CHLORIDE 0.9% IV SOLUTION: Freq: Once | INTRAVENOUS | Status: AC

## 2020-06-16 MED ORDER — AZITHROMYCIN 250 MG PO TABS
500.0000 mg | ORAL_TABLET | Freq: Every day | ORAL | Status: DC
  Administered 2020-06-17 – 2020-06-20 (×4): 500 mg via ORAL
  Filled 2020-06-16 (×4): qty 2

## 2020-06-16 NOTE — Op Note (Signed)
NAME: Jane Andrews, [facility] MEDICAL RECORD SN:[id number] ACCOUNT [id number] DATE OF BIRTH:06/17/1925 FACILITY[facility] LOCATION: [facility] PHYSICIAN:Landry Kamath Kerry Fort, MD  OPERATIVE REPORT  DATE OF PROCEDURE:  06/15/2020  PREOPERATIVE DIAGNOSIS:  Right hip displaced femoral neck fracture.  POSTOPERATIVE DIAGNOSIS:  Right hip displaced femoral neck fracture.  PROCEDURE:  Right total hip arthroplasty through direct anterior approach.  IMPLANTS:  DePuy Sector Gription acetabular component size 48, size 32+0 neutral polyethylene liner, size 12 Corail femoral component with standard offset, size 32+1 metal hip ball.  SURGEON:  Lind Guest. Ninfa Linden, MD  ASSISTANT:  Sherlean Foot, RNFA.  ANESTHESIA:  Spinal.  ESTIMATED BLOOD LOSS:  300 mL.  ANTIBIOTICS:  Two grams IV Ancef.  COMPLICATIONS:  None.  INDICATIONS:  The patient is a 86 year old patient who normally ambulates without any assistive device.  She sustained a mechanical fall early yesterday morning and the reason for her fall was unknown.  She does not remember it.  She ended up sustaining  a right hip displaced femoral neck fracture.  She was seen in an outlying hospital, which was [facility].  Since they had no orthopedic coverage, she was transferred down to the [facility].  I was asked to resume her care from  orthopedic standpoint.  She has been admitted to the medicine service.  I have talked to the patient and her niece about our recommendations for surgical intervention given the fact the patient has done well with mobility before falling and breaking her  hip.  Of note, she does have some slight lung consolidation, so in spite of spinal anesthesia, Anesthesia themselves felt it would be best to observe her in step down following the surgery and agree with their assessment as well.  DESCRIPTION OF PROCEDURE:  After the right hip was marked and informed consent was obtained,  she was brought to the  operating room and sat up on a stretcher where spinal anesthesia was obtained.  She was laid in supine position on a stretcher.  Foley  catheter was placed and both feet had traction boots were placed on both her feet.  Next, she was placed supine on the Hana fracture table, the perineal post in place and both legs in line skeletal traction device and no traction applied.  Her right  operative hip was prepped and draped with DuraPrep and sterile drapes.  A time-out was called.  She was identified as correct patient, correct right hip.  I then made an incision just inferior and posterior to the anterior superior iliac spine and  carried this obliquely down the leg.  We dissected down tensor fascia lata muscle.  Tensor fascia was then divided longitudinally to proceed with direct anterior approach to the hip.  We identified and cauterized circumflex vessels.  I then identified  the hip capsule, opened the hip capsule in an L-type format finding a hematoma consistent with a femoral neck fracture.  I did see a displaced femoral neck fracture.  I placed Cobra retractors around the medial and lateral femoral neck as far as the rim  and just inferior to the fracture.  I made a femoral neck cut inferior to the fracture distal to the fracture, but proximal to the lesser trochanter and completed this with osteotome and placed a corkscrew guide in the femoral head and removed the  femoral head in its entirety.  I then cleaned the bony remnants from the acetabulum and removed remnants of the acetabular labrum and other debris.  I placed a  bent Hohmann over the medial acetabular rim and then began reaming in stepwise increments from  a size 43 reamer up to a size 47.  With all reamers under direct visualization, the last reamer was also placed under direct fluoroscopy so I could obtain my depth of reaming my inclination and anteversion.  I then placed the real DePuy Sector Gription  acetabular component size 48 and a  32+0 neutral polyethylene liner for that size 48 acetabular component.  Attention was then turned to the femur.  With the leg externally rotated to 120 degrees, extended and adducted, I was able to place a Mueller  retractor medially and a Hohmann retractor above the greater trochanter, released lateral joint capsule and used a box-cutting osteotome to enter the femoral  canal and a rongeur to lateralize then began broaching using the Corail broaching system from a  size 8 going up to a size 12.  With a size 12 in place, we trialed a 32+1 trial hip ball, reduced this in the acetabulum.  We were pleased with the leg length, offset, range of motion and stability assessed mechanically and radiographically.  I then  dislocated the hip and removed the trial components.  We placed the real Corail femoral component size 12 with standard offset and the real 32+1 metal hip ball and again reduced this in the acetabulum and it was stable.  We then irrigated the soft tissue  with normal saline solution.  I assessed again radiographically and I was pleased with the position of the implants.  We closed joint capsule with interrupted #1 Ethibond suture, followed by closing the tensor fascia with #1 Vicryl.  0 Vicryl was used  to close deep tissue and 2-0 Vicryl was used to close subcutaneous tissue.  The skin was reapproximated with staples.  Xeroform and Aquacel dressing was applied.  She was taken off the Hana table and taken to recovery room in stable condition.  All final  counts were correct.  There were no complications noted.  Again, postoperatively, she will need to go to step-down unit to be watched overnight.  PN/NUANCE  D:06/15/2020 T:06/16/2020 JOB:[id number]

## 2020-06-16 NOTE — Progress Notes (Signed)
PROGRESS NOTE    Jane Andrews  GDJ:[id number] DOB: [birth year] DOA: 06/14/2020 PCP: Redmond School, MD  Brief Narrative:  86 year old black female HTN, hypothyroid, reflux, Breast cancer status post mastectomy Paroxysmal A. fib not on anticoagulation secondary to prior GI bleed/normocytic anemia Diastolic dysfunction on echo 12/26/2018 Bipolar CKD 2-3 History of short gut syndrome with bowel adhesions in the past and SBO in 2013  Sustained a accidental fall 06/14/2020-found to have right hip fracture-transferred from [facility] under hospitalist service to [facility] long for definitive management of hip fracture by Dr. Ninfa Linden  Perioperatively 7/2 developed hypoxic respiratory failure and transferred to stepdown with possible pneumonia versus atelectasis She then dropped her hemoglobin and needed transfusion of 2 units PRBC and remains on stepdown unit  Assessment & Plan:   Principal Problem:   Closed right hip fracture ([place]) Active Problems:   Hypertension   CKD (chronic kidney disease) stage 3, GFR 30-59 ml/min (HCC)   Hypothyroid   Atrial fibrillation (HCC)   IDA (iron deficiency anemia)   1. Aspiration pneumonia a. CXR 7/2 PM confirms atelectasis versus pneumonia b. Continue ceftriaxone/azithromycin through 06/19/2020 c. Incentive spirometer as able 2. Right hip fracture a. Deferring to orthopedics plan of care b. Therapy working with her this morning c. Prefer pain control with oral opiates as more durable duration-RN made aware d. Antiplatelet agent aspirin 81 twice daily secondary to prior bleeding 3. Acute blood loss anemia from surgery and hemodilution 4.  mild thrombocytopenia a. Hemoglobin low 7/3 AM-getting 2 units PRBC b. Repeat labs a.m. c. Careful monitoring of bandage right hip-marked out 7/3 AM-RN aware to ensure no further worsening d. unclear etiology for thrombocytopenia may be blood loss anemia--not on heparins would work-up if worse or  discussed with family going forward okay for aspirin from my perspective at this time 5. AKI on admission 6. Hypo kalemia-replaced with drip and discontinued replacement 7/3 a. Saline at 40 today and likely saline lock a.m. b. Foley discontinued 7/3 AM 7. Syncope with negative CT head a. Mechanism of fall unclear but she is coherent cognizant b. Hematoma to be evaluated going forward 8. Mild leukocytosis on admission a. See above discussion 9. A. fib CHADS2 score >4 followed by Dr. Domenic Polite not on anticoagulation secondary to prior anemia/GI bleed a. Stable on monitors-if no arrhythmias overnight would DC b. Mainly in sinus rhythm per review  10. Bipolar a. Continuing Xanax one nightly sleep, Cymbalta 6090 mg total (high dose) b. Primidone fifty at bedtime sleep 11. Moderate malnutrition 12. Prior SBO/short gut syndrome a. Circumstances unclear continue B12 13. Compensated diastolic dysfunction a. Not on diuretics at this time continue Lopressor 25 twice daily  DVT prophylaxis:  Code Status: Full confirmed at the bedside Family Communication: Discussed with niece POA [name] at bedside in full detail and answered all questions Disposition:   Status is: Inpatient  Remains inpatient appropriate because:Ongoing active pain requiring inpatient pain management and Unsafe d/c plan   Dispo: The patient is from: Home              Anticipated d/c is to: SNF              Anticipated d/c date is: 3 days              Patient currently is not medically stable to d/c.       Consultants:   Orthopedics none  Procedures: None  Antimicrobials: None   Subjective: She is in some amount of pain  She passed a relatively good night but has received 2 units of PRBC today She has no chest pain nausea vomiting She is about to eat Her family is at the bedside She is able to wiggle her toes  Vitals:   06/16/20 0615 06/16/20 0700 06/16/20 0800 06/16/20 0830  BP: 128/72 (!) 106/55 134/64  140/64  Pulse: 86 80 92 90  Resp: 19 20 17 11   Temp: 98.3 F (36.8 C)     TempSrc: Oral     SpO2: 97% 99% 97% 96%  Weight:      Height:        Intake/Output Summary (Last 24 hours) at 06/16/2020 0924 Last data filed at 06/16/2020 0830 Gross per 24 hour  Intake 4799.56 ml  Output 1600 ml  Net 3199.56 ml   Filed Weights   06/14/20 1116 06/15/20 1459  Weight: 54.4 kg 54.4 kg    Examination:  General exam: AA AA x4 Respiratory system: Clear no rales rhonchi Cardiovascular system: S1-S2 no murmur rub or gallop-monitor showed predominant sinus with sinus tach no A. fib Gastrointestinal system: Soft nontender no rebound no guarding. Central nervous system: Right hip externally rotated some stasis Extremities: Her right hip has a bandage that is slightly soaked-I have marked out otherwise I do not feel any hematoma Skin: As above Psychiatry: Euthymic congruent  Data Reviewed: I have personally reviewed following labs and imaging studies BUN/creatinine  35/1.91-->27/1.8 White count 14.1-->8.0 Hemoglobin 10.4-->6.4 Platelet 174-->86  Radiology Studies: DG Shoulder Right  Result Date: 06/14/2020 CLINICAL DATA:  Right shoulder pain after a fall today. EXAM: RIGHT SHOULDER - 2+ VIEW COMPARISON:  06/24/2008 FINDINGS: No acute fracture or dislocation is identified. Degenerative changes are again noted at the greater tuberosity with subchondral cyst formation. Surgical clips are present in the axilla. IMPRESSION: No acute osseous abnormality. Electronically Signed   By: Logan Bores M.D.   On: 06/14/2020 14:10   DG Elbow Complete Right  Result Date: 06/14/2020 CLINICAL DATA:  Right elbow pain after fall today. EXAM: RIGHT ELBOW - COMPLETE 3+ VIEW COMPARISON:  None. FINDINGS: There is no evidence of fracture, dislocation, or joint effusion. There is no evidence of arthropathy or other focal bone abnormality. Soft tissues are unremarkable. IMPRESSION: Negative. Electronically Signed   By:  Marijo Conception M.D.   On: 06/14/2020 14:10   CT Head Wo Contrast  Result Date: 06/14/2020 CLINICAL DATA:  Headache following fall EXAM: CT HEAD WITHOUT CONTRAST TECHNIQUE: Contiguous axial images were obtained from the base of the skull through the vertex without intravenous contrast. COMPARISON:  July 21, 2008 FINDINGS: Brain: Age related volume loss present. There is no intracranial mass, hemorrhage, extra-axial fluid collection, or midline shift. There is patchy small vessel disease in the centra semiovale bilaterally. No acute appearing infarct evident. Vascular: No hyperdense vessel. There is calcification in each carotid siphon region. Skull: The bony calvarium appears intact. There is diffuse apparent edema in the scalp muscles overlying the right temporal and posterior frontal bone regions. No associated mass in this area on noncontrast enhanced study. Sinuses/Orbits: Mucosal thickening noted in several ethmoid air cells. Other paranasal sinuses are clear. Orbits appear symmetric bilaterally. Other: Mastoid air cells are clear. IMPRESSION: 1. Enlargement of the scalp muscles overlying the posterior frontal and temporal bones on the right. Question hematoma in these areas. Clinical assessment advised in this regard. Underlying bone appears intact. 2. Age related volume loss with patchy periventricular small vessel disease. No acute infarct. No mass or hemorrhage.  3.  There are foci of arterial vascular calcification. 4.  Mucosal thickening noted in several ethmoid air cells. Electronically Signed   By: Lowella Grip III M.D.   On: 06/14/2020 13:48   DG Pelvis Portable  Result Date: 06/15/2020 CLINICAL DATA:  Status post right hip arthroplasty EXAM: PORTABLE PELVIS 1-2 VIEWS COMPARISON:  06/15/2020, 06/14/2020, renal ultrasound 06/08/2020 FINDINGS: Pubic symphysis and rami are intact. Interval right hip replacement with intact hardware and normal alignment. Gas within the soft tissues consistent  with recent surgery. Large staghorn calculus overlying expected location of lower right kidney. Suture like opacities over the lower abdomen and pelvis as before. IMPRESSION: Interval right hip replacement with expected postsurgical changes. Electronically Signed   By: Donavan Foil M.D.   On: 06/15/2020 19:05   DG CHEST PORT 1 VIEW  Result Date: 06/15/2020 CLINICAL DATA:  Pneumonia, status post right hip surgery EXAM: PORTABLE CHEST 1 VIEW COMPARISON:  06/14/2020 FINDINGS: The heart size and mediastinal contours are within normal limits. There may be trace pleural effusions. No acute appearing airspace opacity. The visualized skeletal structures are unremarkable. IMPRESSION: There may be trace pleural effusions. No acute appearing airspace opacity. Electronically Signed   By: Eddie Candle M.D.   On: 06/15/2020 19:59   DG CHEST PORT 1 VIEW  Result Date: 06/14/2020 CLINICAL DATA:  Preoperative exam, closed right hip fracture EXAM: PORTABLE CHEST 1 VIEW COMPARISON:  CT 08/24/2017, radiograph 12/25/2018 FINDINGS: There is focal opacity in the right infrahilar lung, partially silhouetting portion of the right heart border, could reflect volume loss or consolidation in the right middle lobe, additional right lower lobe consolidation or atelectasis may be present as well. No pneumothorax or effusion. The cardiomediastinal contours are unremarkable. No acute osseous or soft tissue abnormality. Degenerative changes are present in the imaged spine and shoulders. Telemetry leads overlie the chest. Surgical clips in the right axilla. IMPRESSION: Focal opacity in the right infrahilar lung silhouetting portion of the right heart border, could reflect volume loss or consolidation in the right middle lobe and possibly right lower lobe. Electronically Signed   By: Lovena Le M.D.   On: 06/14/2020 20:12   DG C-Arm 1-60 Min-No Report  Result Date: 06/15/2020 Fluoroscopy was utilized by the requesting physician.  No  radiographic interpretation.   ECHOCARDIOGRAM COMPLETE  Result Date: 06/15/2020    ECHOCARDIOGRAM REPORT   Patient Name:   Jane Andrews Date of Exam: 06/15/2020 Medical Rec #:  [id number]           Height:       61.0 in Accession #:    [id number]          Weight:       120.0 lb Date of Birth:  Jul 11, 1925           BSA:          1.520 m Patient Age:    67 years            BP:           116/55 mmHg Patient Gender: F                   HR:           80 bpm. Exam Location:  Inpatient Procedure: 2D Echo Indications:    780.2 syncope  History:        Patient has prior history of Echocardiogram examinations, most  recent 12/26/2018. Arrythmias:Atrial Fibrillation; Risk                 Factors:Hypertension and Former Smoker. AS.  Sonographer:    Jannett Celestine RDCS (AE) Referring Phys: [place]  1. Left ventricular ejection fraction, by estimation, is 60 to 65%. The left ventricle has normal function. The left ventricle has no regional wall motion abnormalities. There is mild asymmetric left ventricular hypertrophy of the basal-septal segment. Left ventricular diastolic parameters are consistent with Grade I diastolic dysfunction (impaired relaxation).  2. Right ventricular systolic function is normal. The right ventricular size is not well visualized. Tricuspid regurgitation signal is inadequate for assessing PA pressure.  3. The mitral valve is normal in structure. Trivial mitral valve regurgitation. No evidence of mitral stenosis.  4. The aortic valve is abnormal. Aortic valve regurgitation is not visualized. Mild aortic valve stenosis. Aortic valve mean gradient measures 12.0 mmHg.  5. The inferior vena cava is normal in size with <50% respiratory variability, suggesting right atrial pressure of 8 mmHg. FINDINGS  Left Ventricle: Left ventricular ejection fraction, by estimation, is 60 to 65%. The left ventricle has normal function. The left ventricle has no regional wall  motion abnormalities. The left ventricular internal cavity size was normal in size. There is  mild asymmetric left ventricular hypertrophy of the basal-septal segment. Left ventricular diastolic parameters are consistent with Grade I diastolic dysfunction (impaired relaxation). Right Ventricle: The right ventricular size is not well visualized. Right vetricular wall thickness was not assessed. Right ventricular systolic function is normal. Tricuspid regurgitation signal is inadequate for assessing PA pressure. Left Atrium: Left atrial size was normal in size. Right Atrium: Right atrial size was normal in size. Pericardium: There is no evidence of pericardial effusion. Mitral Valve: The mitral valve is normal in structure. There is moderate thickening of the mitral valve leaflet(s). Normal mobility of the mitral valve leaflets. Mild mitral annular calcification. Trivial mitral valve regurgitation. No evidence of mitral  valve stenosis. Tricuspid Valve: The tricuspid valve is normal in structure. Tricuspid valve regurgitation is not demonstrated. No evidence of tricuspid stenosis. Aortic Valve: The aortic valve is abnormal. Aortic valve regurgitation is not visualized. Mild aortic stenosis is present. There is severe calcifcation of the aortic valve. Aortic valve mean gradient measures 12.0 mmHg. Aortic valve peak gradient measures 19.0 mmHg. Aortic valve area, by VTI measures 1.14 cm. Pulmonic Valve: The pulmonic valve was grossly normal. Pulmonic valve regurgitation is trivial. No evidence of pulmonic stenosis. Aorta: The aortic root is normal in size and structure. Venous: The inferior vena cava is normal in size with less than 50% respiratory variability, suggesting right atrial pressure of 8 mmHg. IAS/Shunts: The interatrial septum was not well visualized.  LEFT VENTRICLE PLAX 2D LVIDd:         3.40 cm  Diastology LVIDs:         2.30 cm  LV e' lateral:   7.51 cm/s LV PW:         1.00 cm  LV E/e' lateral: 10.9 LV  IVS:        0.90 cm  LV e' medial:    8.49 cm/s LVOT diam:     2.00 cm  LV E/e' medial:  9.6 LV SV:         46 LV SV Index:   30 LVOT Area:     3.14 cm  LEFT ATRIUM             Index  RIGHT ATRIUM           Index LA diam:        3.20 cm 2.11 cm/m  RA Area:     11.10 cm LA Vol (A2C):   48.0 ml 31.58 ml/m RA Volume:   23.10 ml  15.20 ml/m LA Vol (A4C):   28.7 ml 18.88 ml/m LA Biplane Vol: 39.4 ml 25.92 ml/m  AORTIC VALVE AV Area (Vmax):    1.21 cm AV Area (Vmean):   1.29 cm AV Area (VTI):     1.14 cm AV Vmax:           218.00 cm/s AV Vmean:          148.500 cm/s AV VTI:            0.400 m AV Peak Grad:      19.0 mmHg AV Mean Grad:      12.0 mmHg LVOT Vmax:         84.00 cm/s LVOT Vmean:        61.100 cm/s LVOT VTI:          0.145 m LVOT/AV VTI ratio: 0.36  AORTA Ao Root diam: 3.50 cm MITRAL VALVE MV Area (PHT): 3.63 cm     SHUNTS MV Decel Time: 209 msec     Systemic VTI:  0.14 m MV E velocity: 81.50 cm/s   Systemic Diam: 2.00 cm MV A velocity: 106.00 cm/s MV E/A ratio:  0.77 Cherlynn Kaiser MD Electronically signed by Cherlynn Kaiser MD Signature Date/Time: 06/15/2020/3:23:30 PM    Final    DG HIP OPERATIVE UNILAT W OR W/O PELVIS RIGHT  Result Date: 06/15/2020 CLINICAL DATA:  Right hip arthroplasty EXAM: OPERATIVE right HIP (WITH PELVIS IF PERFORMED) 3 VIEWS TECHNIQUE: Fluoroscopic spot image(s) were submitted for interpretation post-operatively. COMPARISON:  06/14/2020 FINDINGS: Three low resolution intraoperative spot views of the right hip. Total fluoroscopy time was 11 seconds. The images demonstrate of right hip replacement with normal alignment. IMPRESSION: Intraoperative fluoroscopic assistance provided during right hip replacement. Electronically Signed   By: Donavan Foil M.D.   On: 06/15/2020 19:02   DG Hip Unilat W or Wo Pelvis 2-3 Views Right  Result Date: 06/14/2020 CLINICAL DATA:  Right hip pain after fall today. EXAM: DG HIP (WITH OR WITHOUT PELVIS) 2-3V RIGHT COMPARISON:  None.  FINDINGS: Moderately displaced fracture is seen involving proximal right femoral neck. Left hip is unremarkable. IMPRESSION: Moderately displaced proximal right femoral neck fracture. Electronically Signed   By: Marijo Conception M.D.   On: 06/14/2020 14:11     Scheduled Meds: . aspirin  81 mg Oral BID  . Chlorhexidine Gluconate Cloth  6 each Topical Daily  . docusate sodium  100 mg Oral BID  . DULoxetine  90 mg Oral q morning - 10a  . levothyroxine  75 mcg Oral QAC breakfast  . mouth rinse  15 mL Mouth Rinse BID  . metoprolol tartrate  25 mg Oral BID  . mupirocin ointment  1 application Nasal BID  . pantoprazole  40 mg Oral BID  . primidone  50 mg Oral QHS   Continuous Infusions: . sodium chloride 75 mL/hr at 06/15/20 2335  . azithromycin Stopped (06/16/20 0020)  . cefTRIAXone (ROCEPHIN)  IV Stopped (06/15/20 2309)  . methocarbamol (ROBAXIN) IV       LOS: 2 days    Time spent: [place], MD [facility] To contact the attending provider between 7A-7P or the covering provider during  after hours 7P-7A, please log into the web site [email or web address] and access using universal [place] password for that web site. If you do not have the password, please call the hospital operator.  06/16/2020, 9:24 AM

## 2020-06-16 NOTE — Progress Notes (Signed)
CRITICAL VALUE ALERT  Critical Value:  Hgb 6.4  Date & Time Notied:  06/16/2020 0225  Provider Notified: Kennon Holter NP  Orders Received/Actions taken: Awaiting potential new orders

## 2020-06-16 NOTE — Evaluation (Signed)
Physical Therapy Evaluation Patient Details Name: Jane Andrews MRN: [id number] DOB: 04/23/1925 Today's Date: 06/16/2020   History of Present Illness  86 year old woman with known history of HTN, hypothyroid, reflux, breast cancer status post mastectomy, paroxysmal A. fib not on anticoagulation secondary to prior GI bleed/normocytic anemia. History of short gut syndrome with bowel adhesions in the past and SBO in 2013. Patient sustained a accidental fall 06/14/2020-found to have right hip fracture which was repaired 7/1. Postoperatively 7/2 developed hypoxic respiratory failure and transferred to stepdown with possible pneumonia versus atelectasis.  Clinical Impression  Pt admitted as above and presenting with functional mobility limitations 2* decreased R LE strength/ROM, post op pain, ambulatory balance deficits and limited endurance.  Pt would benefit from follow up rehab at SNF level to maximize IND and safety prior to return home alone.    Follow Up Recommendations SNF    Equipment Recommendations  None recommended by PT    Recommendations for Other Services       Precautions / Restrictions Precautions Precautions: Fall Restrictions Weight Bearing Restrictions: No Other Position/Activity Restrictions: WBAT      Mobility  Bed Mobility Overal bed mobility: Needs Assistance Bed Mobility: Supine to Sit     Supine to sit: Mod assist;+2 for safety/equipment        Transfers Overall transfer level: Needs assistance Equipment used: Rolling walker (2 wheeled) Transfers: Sit to/from [facility] Sit to Stand: Min assist;Mod assist;+2 safety/equipment;From elevated surface Stand pivot transfers: Min assist       General transfer comment: cues for LE management and use of UEs to self assist  Ambulation/Gait Ambulation/Gait assistance: Min assist;+2 safety/equipment Gait Distance (Feet): 9 Feet Assistive device: Rolling walker (2 wheeled) Gait  Pattern/deviations: Step-to pattern;Decreased step length - right;Decreased step length - left;Shuffle;Trunk flexed Gait velocity: decr   General Gait Details: cues for sequence, posture and position from [employer]            Wheelchair Mobility    Modified Rankin (Stroke Patients Only)       Balance Overall balance assessment: History of Falls;Mild deficits observed, not formally tested                                           Pertinent Vitals/Pain Pain Assessment: Faces Faces Pain Scale: Hurts little more Pain Location: R hip, R elbow Pain Descriptors / Indicators: Aching;Sore Pain Intervention(s): Limited activity within patient's tolerance;Monitored during session;Premedicated before session    Home Living Family/patient expects to be discharged to:: Skilled nursing facility Living Arrangements: Alone                    Prior Function Level of Independence: Independent               Hand Dominance        Extremity/Trunk Assessment   Upper Extremity Assessment Upper Extremity Assessment: Defer to OT evaluation RUE Deficits / Details: Decreased shoulder ROM, active assist to raise arm above shoulder level. WFL ROM of elbow, forearm, wrist and hand. Patient exhibits 4/5 strength in bicep, 4-/5 tricep. LUE Deficits / Details: WFL ROM of LUE. Strength 4/5 in shoulder, 4+/5 in elbow    Lower Extremity Assessment Lower Extremity Assessment: RLE deficits/detail    Cervical / Trunk Assessment Cervical / Trunk Assessment: Normal  Communication   Communication: No difficulties;HOH  Cognition Arousal/Alertness: Awake/alert  Behavior During Therapy: WFL for tasks assessed/performed Overall Cognitive Status: Within Functional Limits for tasks assessed                                        General Comments      Exercises Total Joint Exercises Ankle Circles/Pumps: AROM;Both;15 reps;Supine Heel Slides:  AAROM;Right;15 reps;Supine Hip ABduction/ADduction: AAROM;Right;15 reps;Supine   Assessment/Plan    PT Assessment Patient needs continued PT services  PT Problem List Decreased strength;Decreased range of motion;Decreased activity tolerance;Decreased balance;Decreased mobility;Decreased knowledge of use of DME;Pain       PT Treatment Interventions DME instruction;Gait training;Functional mobility training;Therapeutic activities;Therapeutic exercise;Balance training;Patient/family education    PT Goals (Current goals can be found in the Care Plan section)  Acute Rehab PT Goals Patient Stated Goal: To be independent again PT Goal Formulation: With patient Time For Goal Achievement: 06/30/20 Potential to Achieve Goals: Good    Frequency Min 3X/week   Barriers to discharge        Co-evaluation PT/OT/SLP Co-Evaluation/Treatment: Yes Reason for Co-Treatment: For patient/therapist safety PT goals addressed during session: Mobility/safety with mobility OT goals addressed during session: ADL's and self-care       AM-PAC PT "6 Clicks" Mobility  Outcome Measure Help needed turning from your back to your side while in a flat bed without using bedrails?: A Lot Help needed moving from lying on your back to sitting on the side of a flat bed without using bedrails?: A Lot Help needed moving to and from a bed to a chair (including a wheelchair)?: A Lot Help needed standing up from a chair using your arms (e.g., wheelchair or bedside chair)?: A Lot Help needed to walk in hospital room?: A Little Help needed climbing 3-5 steps with a railing? : A Lot 6 Click Score: 13    End of Session Equipment Utilized During Treatment: Gait belt Activity Tolerance: Patient tolerated treatment well;Patient limited by fatigue Patient left: in chair;with call bell/phone within reach;with chair alarm set Nurse Communication: Mobility status PT Visit Diagnosis: Difficulty in walking, not elsewhere  classified (R26.2)    Time: 1610-9604 PT Time Calculation (min) (ACUTE ONLY): 33 min   Charges:   PT Evaluation $PT Eval Low Complexity: 1 Low          [place] Pager [phone] Office [phone]   Anneth Brunell 06/16/2020, 1:09 PM

## 2020-06-16 NOTE — Evaluation (Signed)
Occupational Therapy Evaluation Patient Details Name: Jane Andrews MRN: [id number] DOB: November 10, 1925 Today's Date: 06/16/2020    History of Present Illness 86 year old woman with known history of HTN, hypothyroid, reflux, breast cancer status post mastectomy, paroxysmal A. fib not on anticoagulation secondary to prior GI bleed/normocytic anemia. History of short gut syndrome with bowel adhesions in the past and SBO in 2013. Patient sustained a accidental fall 06/14/2020-found to have right hip fracture which was repaired 7/1. Postoperatively 7/2 developed hypoxic respiratory failure and transferred to stepdown with possible pneumonia versus atelectasis.   Clinical Impression   Mrs. Tamasha Laplante is a 86 year old woman s/p R total hip arthroplasty (anterior approach) who presents with decreased ROM and strength of right hip as well as right shoulder (reports falling on shoulder as well), decreased activity tolerance, generalized weakness, and complaints of pain resulting in decreased ability to perform transfers, ambulation and ADLs. Patient independent prior to fall and reports a history of "bad balance." Patient mod assist for transfer out of bed, min assist for standing and ambulation with walker. Patient requiring min assist for upper body ADLs due to right shoulder ROM/strength deficits, max assist for lower body ADLs and total assist for toileting. Patient will benefit from skilled OT services to improve deficits and return to PLOF. Patient will require short term rehab at discharge.    Follow Up Recommendations  SNF    Equipment Recommendations  Other (comment) (Defer to next venue)    Recommendations for Other Services       Precautions / Restrictions Precautions Precautions: Fall Restrictions Weight Bearing Restrictions: No      Mobility Bed Mobility Overal bed mobility: Needs Assistance Bed Mobility: Supine to Sit     Supine to sit: Mod assist;+2 for  safety/equipment        Transfers Overall transfer level: Needs assistance Equipment used: Rolling walker (2 wheeled) Transfers: Sit to/from [facility] Sit to Stand: Min assist Stand pivot transfers: Min assist       General transfer comment: Slow ambulation. Limited distance to in room.    Balance Overall balance assessment: History of Falls;Mild deficits observed, not formally tested                                         ADL either performed or assessed with clinical judgement   ADL Overall ADL's : Needs assistance/impaired Eating/Feeding: Sitting;Set up   Grooming: Sitting;Set up;Wash/dry hands;Wash/dry face   Upper Body Bathing: Minimal assistance;Sitting;Set up   Lower Body Bathing: Maximal assistance;Sit to/from stand;Set up   Upper Body Dressing : Minimal assistance;Sitting;Set up   Lower Body Dressing: Maximal assistance;Sit to/from stand   Toilet Transfer: Minimal assistance;Stand-pivot;BSC;RW   Toileting- Clothing Manipulation and Hygiene: Total assistance;Sit to/from Nurse, children's Details (indicate cue type and reason): n/a         Vision Patient Visual Report: No change from baseline       Perception     Praxis      Pertinent Vitals/Pain Pain Assessment: Faces Faces Pain Scale: Hurts a little bit Pain Location: R hip, R elbow Pain Intervention(s): Monitored during session     Hand Dominance     Extremity/Trunk Assessment Upper Extremity Assessment Upper Extremity Assessment: RUE deficits/detail;LUE deficits/detail RUE Deficits / Details: Decreased shoulder ROM, active assist to raise arm above shoulder level. WFL ROM of elbow, forearm,  wrist and hand. Patient exhibits 4/5 strength in bicep, 4-/5 tricep. LUE Deficits / Details: WFL ROM of LUE. Strength 4/5 in shoulder, 4+/5 in elbow   Lower Extremity Assessment Lower Extremity Assessment: Defer to PT evaluation   Cervical / Trunk  Assessment Cervical / Trunk Assessment: Normal   Communication Communication Communication: No difficulties   Cognition Arousal/Alertness: Awake/alert Behavior During Therapy: WFL for tasks assessed/performed Overall Cognitive Status: Within Functional Limits for tasks assessed                                     General Comments       Exercises     Shoulder Instructions      Home Living Family/patient expects to be discharged to:: Skilled nursing facility Living Arrangements: Alone                                      Prior Functioning/Environment Level of Independence: Independent                 OT Problem List: Decreased strength;Decreased range of motion;Decreased activity tolerance;Decreased knowledge of use of DME or AE;Pain;Impaired UE functional use      OT Treatment/Interventions: Self-care/ADL training;Therapeutic exercise;DME and/or AE instruction;Therapeutic activities;Patient/family education;Balance training    OT Goals(Current goals can be found in the care plan section) Acute Rehab OT Goals Patient Stated Goal: To be independent again OT Goal Formulation: With patient/family Time For Goal Achievement: 06/30/20 Potential to Achieve Goals: Good  OT Frequency: Min 2X/week   Barriers to D/C:            Co-evaluation              AM-PAC OT "6 Clicks" Daily Activity     Outcome Measure Help from another person eating meals?: A Little Help from another person taking care of personal grooming?: A Little Help from another person toileting, which includes using toliet, bedpan, or urinal?: Total Help from another person bathing (including washing, rinsing, drying)?: A Lot Help from another person to put on and taking off regular upper body clothing?: A Little Help from another person to put on and taking off regular lower body clothing?: A Lot 6 Click Score: 14   End of Session Equipment Utilized During  Treatment: Gait belt;Rolling walker Nurse Communication: Mobility status  Activity Tolerance: Patient tolerated treatment well Patient left: in chair;with call bell/phone within reach;with chair alarm set;with family/visitor present  OT Visit Diagnosis: Unsteadiness on feet (R26.81);Other abnormalities of gait and mobility (R26.89);Muscle weakness (generalized) (M62.81);Pain Pain - Right/Left: Right Pain - part of body: Hip                Time: 8921-1941 OT Time Calculation (min): 33 min Charges:  OT General Charges $OT Visit: 1 Visit OT Evaluation $OT Eval Low Complexity: 1 Low  Bach Rocchi, OTR/L [place]  Office [phone] Pager: [phone]   Lenward Chancellor 06/16/2020, 12:52 PM

## 2020-06-17 LAB — BPAM RBC
Blood Product Expiration Date: 202107262359
Blood Product Expiration Date: 202107292359
ISSUE DATE / TIME: 202107030347
ISSUE DATE / TIME: 202107030554
Unit Type and Rh: 5100
Unit Type and Rh: 5100

## 2020-06-17 LAB — CBC WITH DIFFERENTIAL/PLATELET
Abs Immature Granulocytes: 0.03 10*3/uL (ref 0.00–0.07)
Basophils Absolute: 0 10*3/uL (ref 0.0–0.1)
Basophils Relative: 0 %
Eosinophils Absolute: 0.7 10*3/uL — ABNORMAL HIGH (ref 0.0–0.5)
Eosinophils Relative: 10 %
HCT: 24.7 % — ABNORMAL LOW (ref 36.0–46.0)
Hemoglobin: 7.9 g/dL — ABNORMAL LOW (ref 12.0–15.0)
Immature Granulocytes: 0 %
Lymphocytes Relative: 11 %
Lymphs Abs: 0.8 10*3/uL (ref 0.7–4.0)
MCH: 29.9 pg (ref 26.0–34.0)
MCHC: 32 g/dL (ref 30.0–36.0)
MCV: 93.6 fL (ref 80.0–100.0)
Monocytes Absolute: 1 10*3/uL (ref 0.1–1.0)
Monocytes Relative: 14 %
Neutro Abs: 4.6 10*3/uL (ref 1.7–7.7)
Neutrophils Relative %: 65 %
Platelets: 90 10*3/uL — ABNORMAL LOW (ref 150–400)
RBC: 2.64 MIL/uL — ABNORMAL LOW (ref 3.87–5.11)
RDW: 15 % (ref 11.5–15.5)
WBC: 7.2 10*3/uL (ref 4.0–10.5)
nRBC: 0 % (ref 0.0–0.2)

## 2020-06-17 LAB — TYPE AND SCREEN
ABO/RH(D): O POS
Antibody Screen: NEGATIVE
Unit division: 0
Unit division: 0

## 2020-06-17 LAB — BASIC METABOLIC PANEL
Anion gap: 10 (ref 5–15)
BUN: 33 mg/dL — ABNORMAL HIGH (ref 8–23)
CO2: 18 mmol/L — ABNORMAL LOW (ref 22–32)
Calcium: 7.2 mg/dL — ABNORMAL LOW (ref 8.9–10.3)
Chloride: 108 mmol/L (ref 98–111)
Creatinine, Ser: 1.86 mg/dL — ABNORMAL HIGH (ref 0.44–1.00)
GFR calc Af Amer: 27 mL/min — ABNORMAL LOW (ref >60)
GFR calc non Af Amer: 23 mL/min — ABNORMAL LOW (ref >60)
Glucose, Bld: 106 mg/dL — ABNORMAL HIGH (ref 70–99)
Potassium: 4.2 mmol/L (ref 3.5–5.1)
Sodium: 136 mmol/L (ref 135–145)

## 2020-06-17 MED ORDER — BISACODYL 5 MG PO TBEC
5.0000 mg | DELAYED_RELEASE_TABLET | Freq: Once | ORAL | Status: AC
  Administered 2020-06-17: 5 mg via ORAL
  Filled 2020-06-17: qty 1

## 2020-06-17 MED ORDER — POLYETHYLENE GLYCOL 3350 17 G PO PACK: 17.0000 g | PACK | Freq: Every day | ORAL | Status: DC

## 2020-06-17 MED ORDER — METOPROLOL SUCCINATE ER 25 MG PO TB24
25.0000 mg | ORAL_TABLET | Freq: Every day | ORAL | Status: DC
  Administered 2020-06-17 – 2020-06-20 (×4): 25 mg via ORAL
  Filled 2020-06-17 (×4): qty 1

## 2020-06-17 NOTE — Progress Notes (Signed)
Subjective: 2 Days Post-Op Procedure(s) (LRB): ANTERIOR ARTHROPLASTY BIPOLAR HIP (HEMIARTHROPLASTY) (Right) Patient reports pain as moderate.  Hunter with PT at the bedside now as well as her niece.  She did require a transfusion yesterday secondary to acute blood loss anemia.  Awake and alert.  Very independent before this injury.  Objective: Vital signs in last 24 hours: Temp:  [97.9 F (36.6 C)-98.7 F (37.1 C)] 98 F (36.7 C) (07/04 0700) Pulse Rate:  [68-96] 83 (07/04 1000) Resp:  [10-24] 17 (07/04 1000) BP: (82-147)/(36-109) 116/88 (07/04 1000) SpO2:  [90 %-100 %] 92 % (07/04 1000)  Intake/Output from previous day: 07/03 0701 - 07/04 0700 In: 790 [I.V.:320; Blood:470] Out: 500 [Urine:500] Intake/Output this shift: No intake/output data recorded.  Recent Labs    06/15/20 1730 06/15/20 1748 06/16/20 0106 06/16/20 1109 06/17/20 0648  HGB 7.8* 8.3* 6.4* 9.4* 7.9*   Recent Labs    06/16/20 1109 06/17/20 0648  WBC 8.2 7.2  RBC 3.09* 2.64*  HCT 28.5* 24.7*  PLT 102* 90*   Recent Labs    06/16/20 0106 06/17/20 0648  NA 136 136  K 4.7 4.2  CL 108 108  CO2 21* 18*  BUN 27* 33*  CREATININE 1.83* 1.86*  GLUCOSE 117* 106*  CALCIUM 7.3* 7.2*   No results for input(s): LABPT, INR in the last 72 hours.  Sensation intact distally Intact pulses distally Dorsiflexion/Plantar flexion intact Incision: scant drainage   Assessment/Plan: 2 Days Post-Op Procedure(s) (LRB): ANTERIOR ARTHROPLASTY BIPOLAR HIP (HEMIARTHROPLASTY) (Right) Up with therapy WBAT Will need short term skilled nursing post-hospital stay. [facility] in [place].     Mcarthur Rossetti 06/17/2020, 10:43 AM

## 2020-06-17 NOTE — Progress Notes (Signed)
Physical Therapy Treatment Patient Details Name: Jane Andrews MRN: [id number] DOB: Jun 21, 1925 Today's Date: 06/17/2020    History of Present Illness 86 year old woman with known history of HTN, hypothyroid, reflux, breast cancer status post mastectomy, paroxysmal A. fib not on anticoagulation secondary to prior GI bleed/normocytic anemia. History of short gut syndrome with bowel adhesions in the past and SBO in 2013. Patient sustained a accidental fall 06/14/2020-found to have right hip fracture which was repaired 7/1. Postoperatively 7/2 developed hypoxic respiratory failure and transferred to stepdown with possible pneumonia versus atelectasis.    PT Comments    Pt very motivated and with marked improvement in activity tolerance noted.  Pt performed therex program with assist, ambulated 120' in hall with min assist, and requiring decreased assist level for all tasks.  Pt and family very pleased with progress.   Follow Up Recommendations  SNF     Equipment Recommendations  None recommended by PT    Recommendations for Other Services       Precautions / Restrictions Precautions Precautions: Fall Restrictions Weight Bearing Restrictions: No Other Position/Activity Restrictions: WBAT    Mobility  Bed Mobility Overal bed mobility: Needs Assistance Bed Mobility: Supine to Sit     Supine to sit: Mod assist     General bed mobility comments: increased time with cues for sequence and use of L LE to self assist; physical assist to manage L LE and to bring trunk to upright  Transfers Overall transfer level: Needs assistance Equipment used: Rolling walker (2 wheeled) Transfers: Sit to/from Stand Sit to Stand: Min assist         General transfer comment: cues for LE management and use of UEs to self assist  Ambulation/Gait Ambulation/Gait assistance: Min assist Gait Distance (Feet): 120 Feet Assistive device: Rolling walker (2 wheeled) Gait Pattern/deviations:  Decreased step length - right;Decreased step length - left;Shuffle;Trunk flexed;Step-to pattern;Step-through pattern Gait velocity: decr   General Gait Details: cues for sequence, posture and position from [employer] Mobility    Modified Rankin (Stroke Patients Only)       Balance Overall balance assessment: History of Falls;Mild deficits observed, not formally tested                                          Cognition Arousal/Alertness: Awake/alert Behavior During Therapy: WFL for tasks assessed/performed Overall Cognitive Status: Within Functional Limits for tasks assessed                                        Exercises Total Joint Exercises Ankle Circles/Pumps: AROM;Both;15 reps;Supine Quad Sets: AROM;Both;10 reps;Supine Heel Slides: AAROM;Right;Supine;20 reps Hip ABduction/ADduction: AAROM;Right;15 reps;Supine    General Comments        Pertinent Vitals/Pain Pain Assessment: 0-10 Pain Score: 5  Pain Location: R hip Pain Descriptors / Indicators: Aching;Sore Pain Intervention(s): Limited activity within patient's tolerance;Monitored during session;Premedicated before session    Home Living                      Prior Function            PT Goals (current goals can now be found in the care plan section) Acute Rehab PT Goals  Patient Stated Goal: To be independent again PT Goal Formulation: With patient Time For Goal Achievement: 06/30/20 Potential to Achieve Goals: Good Progress towards PT goals: Progressing toward goals    Frequency    Min 3X/week      PT Plan Current plan remains appropriate    Co-evaluation              AM-PAC PT "6 Clicks" Mobility   Outcome Measure  Help needed turning from your back to your side while in a flat bed without using bedrails?: A Lot Help needed moving from lying on your back to sitting on the side of a flat bed without using  bedrails?: A Lot Help needed moving to and from a bed to a chair (including a wheelchair)?: A Little Help needed standing up from a chair using your arms (e.g., wheelchair or bedside chair)?: A Little Help needed to walk in hospital room?: A Little Help needed climbing 3-5 steps with a railing? : A Lot 6 Click Score: 15    End of Session Equipment Utilized During Treatment: Gait belt Activity Tolerance: Patient tolerated treatment well Patient left: in chair;with call bell/phone within reach;with chair alarm set;with family/visitor present Nurse Communication: Mobility status PT Visit Diagnosis: Difficulty in walking, not elsewhere classified (R26.2)     Time: 2426-8341 PT Time Calculation (min) (ACUTE ONLY): 42 min  Charges:  $Gait Training: 23-37 mins $Therapeutic Exercise: 8-22 mins                     Debe Coder PT Acute Rehabilitation Services Pager [phone] Office [phone]    Jane Andrews 06/17/2020, 12:53 PM

## 2020-06-17 NOTE — Discharge Instructions (Signed)

## 2020-06-17 NOTE — Progress Notes (Signed)
PROGRESS NOTE    Jane Andrews  MIW:[id number] DOB: [birth year] DOA: 06/14/2020 PCP: Redmond School, MD  Brief Narrative:  86 year old black female HTN, hypothyroid, reflux, Breast cancer status post mastectomy Paroxysmal A. fib not on anticoagulation secondary to prior GI bleed/normocytic anemia Diastolic dysfunction on echo 12/26/2018 Bipolar CKD 2-3 History of short gut syndrome with bowel adhesions in the past and SBO in 2013  Sustained a accidental fall 06/14/2020-found to have right hip fracture-transferred from [facility] under hospitalist service to [facility] long for definitive management of hip fracture by Dr. Ninfa Linden  Perioperatively 7/2 developed hypoxic respiratory failure and transferred to stepdown with possible pneumonia versus atelectasis She then dropped her hemoglobin and needed transfusion of 2 units PRBC and remains on stepdown unit  Assessment & Plan:   Principal Problem:   Closed right hip fracture ([facility]) Active Problems:   Hypertension   CKD (chronic kidney disease) stage 3, GFR 30-59 ml/min (HCC)   Hypothyroid   Atrial fibrillation (HCC)   IDA (iron deficiency anemia)   1. Aspiration pneumonia a. CXR 7/2 PM shows aspiration b. Continue ceftriaxone/azithromycin through 06/19/2020, I-S, pulmonary hygiene 2. Right hip fracture a. Will need skilled care b. Continue oral opiates as more durable duration-RN made aware c. Antiplatelet agent aspirin 81 twice daily secondary to prior bleeding 3. Acute blood loss anemia from surgery and hemodilution 4.  mild thrombocytopenia a. Transfused 7/3 2 PRBC b. Wounds are clean per surgeon c. No heparinoid's continue aspirin 81 twice daily 5. AKI on admission 6. Hypo kalemia-replaced with drip and discontinued replacement 7/3 a. Saline saline lock 7/4 repeat labs a.m. b. Foley discontinued 7/3 AM 7. Syncope with negative CT head a. Mechanism of fall unclear but she is coherent cognizant b. Hematoma to be  evaluated going forward 8. Mild leukocytosis on admission a. See above discussion 9. A. fib CHADS2 score >4 followed by Dr. Domenic Polite not on anticoagulation secondary to prior anemia/GI bleed a. Stable on monitors-likely can DC monitors once out of stepdown  b. Mainly in sinus rhythm per review  10. Bipolar a. Continuing Xanax one nightly sleep, Cymbalta 6090 mg total (high dose) b. Primidone fifty at bedtime sleep 11. Moderate malnutrition 12. Prior SBO/short gut syndrome a. Circumstances unclear continue B12 13. Compensated diastolic dysfunction a. Not on diuretics at this time continue Lopressor 25 twice daily  DVT prophylaxis: SCDs TED hose and aspirin Code Status: Full confirmed at the bedside Family Communication: Discussed with niece POA [name] on telephone 06/17/2020   likely can transfer to floor later today If there is a urgent need need for bed otherwise early in the morning tomorrow Disposition:   Status is: Inpatient  Remains inpatient appropriate because:Ongoing active pain requiring inpatient pain management and Unsafe d/c plan   Dispo: The patient is from: Home              Anticipated d/c is to: SNF              Anticipated d/c date is: 3 days              Patient currently is not medically stable to d/c.       Consultants:   Orthopedics none  Procedures: None  Antimicrobials: None   Subjective: Awakens easily was getting a nap States pain is bearable to some degree Ate a good meal  Vitals:   06/17/20 0840 06/17/20 0953 06/17/20 1000 06/17/20 1206  BP: (!) 112/54 (!) 109/50 116/88 (!) 145/64  Pulse:  83 75 83 80  Resp: 17 13 17 17   Temp:    98.6 F (37 C)  TempSrc:    Oral  SpO2: 92% 95% 92% 92%  Weight:      Height:        Intake/Output Summary (Last 24 hours) at 06/17/2020 1431 Last data filed at 06/16/2020 2000 Gross per 24 hour  Intake 200 ml  Output 250 ml  Net -50 ml   Filed Weights   06/14/20 1116 06/15/20 1459  Weight: 54.4 kg  54.4 kg    Examination:  General exam: AA AA x4 Respiratory system: Clear no rales rhonchi Cardiovascular system: S1-S2 no murmur rub or gallop-monitor sinus tach Gastrointestinal system: Soft nontender no rebound no guarding. Central nervous system: Neurologically grossly intact Extremities: Bandage has been changed by surgeon I did not review wound Skin: As above Psychiatry: Euthymic congruent  Data Reviewed: I have personally reviewed following labs and imaging studies BUN/creatinine  35/1.91-->27/1.8-->33/1.86 White count 14.1-->8.0-->7.2 Hemoglobin 10.4-->6.4-->9.4-->7.9 Platelet 174-->86-->102-->90  Radiology Studies: DG Pelvis Portable  Result Date: 06/15/2020 CLINICAL DATA:  Status post right hip arthroplasty EXAM: PORTABLE PELVIS 1-2 VIEWS COMPARISON:  06/15/2020, 06/14/2020, renal ultrasound 06/08/2020 FINDINGS: Pubic symphysis and rami are intact. Interval right hip replacement with intact hardware and normal alignment. Gas within the soft tissues consistent with recent surgery. Large staghorn calculus overlying expected location of lower right kidney. Suture like opacities over the lower abdomen and pelvis as before. IMPRESSION: Interval right hip replacement with expected postsurgical changes. Electronically Signed   By: Donavan Foil M.D.   On: 06/15/2020 19:05   DG CHEST PORT 1 VIEW  Result Date: 06/15/2020 CLINICAL DATA:  Pneumonia, status post right hip surgery EXAM: PORTABLE CHEST 1 VIEW COMPARISON:  06/14/2020 FINDINGS: The heart size and mediastinal contours are within normal limits. There may be trace pleural effusions. No acute appearing airspace opacity. The visualized skeletal structures are unremarkable. IMPRESSION: There may be trace pleural effusions. No acute appearing airspace opacity. Electronically Signed   By: Eddie Candle M.D.   On: 06/15/2020 19:59   DG C-Arm 1-60 Min-No Report  Result Date: 06/15/2020 Fluoroscopy was utilized by the requesting physician.   No radiographic interpretation.   DG HIP OPERATIVE UNILAT W OR W/O PELVIS RIGHT  Result Date: 06/15/2020 CLINICAL DATA:  Right hip arthroplasty EXAM: OPERATIVE right HIP (WITH PELVIS IF PERFORMED) 3 VIEWS TECHNIQUE: Fluoroscopic spot image(s) were submitted for interpretation post-operatively. COMPARISON:  06/14/2020 FINDINGS: Three low resolution intraoperative spot views of the right hip. Total fluoroscopy time was 11 seconds. The images demonstrate of right hip replacement with normal alignment. IMPRESSION: Intraoperative fluoroscopic assistance provided during right hip replacement. Electronically Signed   By: Donavan Foil M.D.   On: 06/15/2020 19:02     Scheduled Meds:  aspirin  81 mg Oral BID   azithromycin  500 mg Oral Daily   Chlorhexidine Gluconate Cloth  6 each Topical Daily   docusate sodium  100 mg Oral BID   DULoxetine  90 mg Oral q morning - 10a   levothyroxine  75 mcg Oral QAC breakfast   mouth rinse  15 mL Mouth Rinse BID   metoprolol succinate  25 mg Oral Daily   mupirocin ointment  1 application Nasal BID   pantoprazole  40 mg Oral BID   polyethylene glycol  17 g Oral Daily   primidone  50 mg Oral QHS   Continuous Infusions:  sodium chloride 10 mL/hr at 06/17/20 1404   cefTRIAXone (ROCEPHIN)  IV Stopped (06/16/20 2246)   methocarbamol (ROBAXIN) IV       LOS: 3 days    Time spent: [place], MD [facility] To contact the attending provider between 7A-7P or the covering provider during after hours 7P-7A, please log into the web site [email or web address] and access using universal [place] password for that web site. If you do not have the password, please call the hospital operator.  06/17/2020, 2:31 PM

## 2020-06-18 LAB — CBC WITH DIFFERENTIAL/PLATELET
Abs Immature Granulocytes: 0.03 10*3/uL (ref 0.00–0.07)
Basophils Absolute: 0 10*3/uL (ref 0.0–0.1)
Basophils Relative: 0 %
Eosinophils Absolute: 0.9 10*3/uL — ABNORMAL HIGH (ref 0.0–0.5)
Eosinophils Relative: 13 %
HCT: 24.6 % — ABNORMAL LOW (ref 36.0–46.0)
Hemoglobin: 7.9 g/dL — ABNORMAL LOW (ref 12.0–15.0)
Immature Granulocytes: 0 %
Lymphocytes Relative: 16 %
Lymphs Abs: 1.2 10*3/uL (ref 0.7–4.0)
MCH: 29.9 pg (ref 26.0–34.0)
MCHC: 32.1 g/dL (ref 30.0–36.0)
MCV: 93.2 fL (ref 80.0–100.0)
Monocytes Absolute: 1.1 10*3/uL — ABNORMAL HIGH (ref 0.1–1.0)
Monocytes Relative: 15 %
Neutro Abs: 4 10*3/uL (ref 1.7–7.7)
Neutrophils Relative %: 56 %
Platelets: 108 10*3/uL — ABNORMAL LOW (ref 150–400)
RBC: 2.64 MIL/uL — ABNORMAL LOW (ref 3.87–5.11)
RDW: 15.1 % (ref 11.5–15.5)
WBC: 7.3 10*3/uL (ref 4.0–10.5)
nRBC: 0 % (ref 0.0–0.2)

## 2020-06-18 LAB — COMPREHENSIVE METABOLIC PANEL
ALT: 12 U/L (ref 0–44)
AST: 20 U/L (ref 15–41)
Albumin: 2.4 g/dL — ABNORMAL LOW (ref 3.5–5.0)
Alkaline Phosphatase: 50 U/L (ref 38–126)
Anion gap: 6 (ref 5–15)
BUN: 30 mg/dL — ABNORMAL HIGH (ref 8–23)
CO2: 20 mmol/L — ABNORMAL LOW (ref 22–32)
Calcium: 7.5 mg/dL — ABNORMAL LOW (ref 8.9–10.3)
Chloride: 109 mmol/L (ref 98–111)
Creatinine, Ser: 1.65 mg/dL — ABNORMAL HIGH (ref 0.44–1.00)
GFR calc Af Amer: 31 mL/min — ABNORMAL LOW (ref >60)
GFR calc non Af Amer: 27 mL/min — ABNORMAL LOW (ref >60)
Glucose, Bld: 92 mg/dL (ref 70–99)
Potassium: 3.9 mmol/L (ref 3.5–5.1)
Sodium: 135 mmol/L (ref 135–145)
Total Bilirubin: 0.5 mg/dL (ref 0.3–1.2)
Total Protein: 5 g/dL — ABNORMAL LOW (ref 6.5–8.1)

## 2020-06-18 LAB — GLUCOSE, CAPILLARY
Glucose-Capillary: 83 mg/dL (ref 70–99)
Glucose-Capillary: 90 mg/dL (ref 70–99)
Glucose-Capillary: 118 mg/dL — ABNORMAL HIGH (ref 70–99)

## 2020-06-18 LAB — PROTIME-INR
INR: 1.1 (ref 0.8–1.2)
Prothrombin Time: 13.6 seconds (ref 11.4–15.2)

## 2020-06-18 MED ORDER — HYDROCODONE-ACETAMINOPHEN 5-325 MG PO TABS: 1.0000 | ORAL_TABLET | Freq: Four times a day (QID) | ORAL | 0 refills | Status: DC | PRN

## 2020-06-18 MED ORDER — ASPIRIN 81 MG PO CHEW: 81.0000 mg | CHEWABLE_TABLET | Freq: Two times a day (BID) | ORAL | 0 refills | Status: DC

## 2020-06-18 NOTE — Progress Notes (Signed)
Physical Therapy Treatment Patient Details Name: Jane Andrews MRN: [id number] DOB: [birth year] Today's Date: 06/18/2020    History of Present Illness 86 year old woman with known history of HTN, hypothyroid, reflux, breast cancer status post mastectomy, paroxysmal A. fib not on anticoagulation secondary to prior GI bleed/normocytic anemia. History of short gut syndrome with bowel adhesions in the past and SBO in 2013. Patient sustained a accidental fall 06/14/2020-found to have right hip fracture which was repaired 7/1. Postoperatively 7/2 developed hypoxic respiratory failure and transferred to stepdown with possible pneumonia versus atelectasis.    PT Comments    The patient ambulated x 34' with RW. Reports right hip discomfort. Patient's HR 90 and SPO2 on RA 100%. RR 23, mild 2-3/4 dyspnea. Continue PT.  Follow Up Recommendations  SNF     Equipment Recommendations  None recommended by PT    Recommendations for Other Services       Precautions / Restrictions Precautions Precautions: Fall Precaution Comments: monitor VS    Mobility  Bed Mobility   Bed Mobility: Sit to Supine       Sit to supine: Mod assist   General bed mobility comments: assist with legs onto bed,Trunk sort of fell onto bed/  Transfers Overall transfer level: Needs assistance Equipment used: Rolling walker (2 wheeled) Transfers: Sit to/from Stand Sit to Stand: Mod assist         General transfer comment: assist to power up from the recliner.  Ambulation/Gait Ambulation/Gait assistance: Min assist Gait Distance (Feet): 80 Feet Assistive device: Rolling walker (2 wheeled) Gait Pattern/deviations: Decreased step length - right;Decreased step length - left;Shuffle;Trunk flexed;Step-to pattern;Step-through pattern Gait velocity: decr   General Gait Details: cues for sequence, posture and position from [employer] Mobility    Modified Rankin (Stroke Patients  Only)       Balance Overall balance assessment: Mild deficits observed, not formally tested                                          Cognition Arousal/Alertness: Awake/alert Behavior During Therapy: [facility] for tasks assessed/performed                                          Exercises      General Comments General comments (skin integrity, edema, etc.): reliant on RW and UE's      Pertinent Vitals/Pain Pain Score: 6  Pain Location: R hip Pain Descriptors / Indicators: Aching;Sore Pain Intervention(s): Repositioned;Limited activity within patient's tolerance    Home Living                      Prior Function            PT Goals (current goals can now be found in the care plan section) Progress towards PT goals: Progressing toward goals    Frequency    Min 3X/week      PT Plan Current plan remains appropriate    Co-evaluation              AM-PAC PT "6 Clicks" Mobility   Outcome Measure  Help needed turning from your back to your side while in a flat bed without using bedrails?: A  Lot Help needed moving from lying on your back to sitting on the side of a flat bed without using bedrails?: A Lot Help needed moving to and from a bed to a chair (including a wheelchair)?: A Lot Help needed standing up from a chair using your arms (e.g., wheelchair or bedside chair)?: A Lot Help needed to walk in hospital room?: A Little Help needed climbing 3-5 steps with a railing? : A Lot 6 Click Score: 13    End of Session   Activity Tolerance: Patient tolerated treatment well Patient left: in bed;with call bell/phone within reach Nurse Communication: Mobility status PT Visit Diagnosis: Difficulty in walking, not elsewhere classified (R26.2);Pain Pain - Right/Left: Right Pain - part of body: Hip     Time: 3142-7670 PT Time Calculation (min) (ACUTE ONLY): 29 min  Charges:  $Gait Training: 23-37 mins                      [place] Pager [phone] Office [phone]    Claretha Cooper 06/18/2020, 12:30 PM

## 2020-06-18 NOTE — Progress Notes (Signed)
Patient ID: Jane Andrews, female   DOB: 1924-12-22, 86 y.o.   MRN: [id number] Awake and alert this am.  Vitals stable including O2 sats, which are normal on room air.  Reports mild right hip pain.  I did change her dressing at the bedside and spoke with her niece as well.  Likely skilled nursing [facility]) tomorrow.  Stable from Ortho standpoint.

## 2020-06-18 NOTE — TOC Progression Note (Addendum)
Transition of Care [facility]) - Progression Note    Patient Details  Name: Jane Andrews MRN: [id number] Date of Birth: 03/24/1925  [facility] CM/SW Contact  Leeroy Cha, RN Phone Number: 06/18/2020, 11:00 AM  Clinical Narrative:    Spoke with Pateitn does want to go to the [facility] in [place] snf tct terri [facility] with [facility]- out of the [place] today for the holiday will call back on [phone]. tct-Donna message left on answering to please call back. Expected Discharge Plan: [place] Barriers to Discharge: Continued Medical Work up  Expected Discharge Plan and Services Expected Discharge Plan: [place] In-house Referral: Clinical Social Work   Post Acute Care Choice: [place] Living arrangements for the past 2 months: Single Family Home                                       Social Determinants of Health (SDOH) Interventions    Readmission Risk Interventions Readmission Risk Prevention Plan 06/15/2020  Medication Screening Complete  Transportation Screening Complete  Some recent data might be hidden

## 2020-06-18 NOTE — Progress Notes (Signed)
PROGRESS NOTE    Jane Andrews  XFG:[id number] DOB: May 02, 1925 DOA: 06/14/2020 PCP: Redmond School, MD  Brief Narrative:  86 year old WF HTN, hypothyroid, reflux, Breast cancer status post mastectomy Paroxysmal A. 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 Ninfa Linden  Perioperatively 7/2 developed hypoxic respiratory failure and transferred to stepdown with possible pneumonia versus atelectasis She then dropped her hemoglobin and needed transfusion of 2 units PRBC and remains on stepdown unit  Assessment & Plan:   Principal Problem:   Closed right hip fracture ([place]) Active Problems:   Hypertension   CKD (chronic kidney disease) stage 3, GFR 30-59 ml/min (HCC)   Hypothyroid   Atrial fibrillation (HCC)   IDA (iron deficiency anemia)   1. Aspiration pneumonia a. CXR 7/2 PM shows aspiration b. Continue ceftriaxone/azithromycin through 06/19/2020, I-S, pulmonary hygiene 2. Right hip fracture a. Will need skilled care--transferring to medical floor today as has stabilized b. Continue oral opiates as more durable duration c. Antiplatelet agent aspirin 81 twice daily secondary to prior bleeding 3. Acute blood loss anemia from surgery and hemodilution 4.  mild thrombocytopenia a. Transfused 7/3 2 PRBC-hemoglobin is relatively stable for the past 2 days at 7.9 b. Wounds are clean per surgeon c. No heparinoid's continue aspirin 81 twice daily i. Thrombocytopenia is resolving slowly and was likely volume effect secondary to resuscitation + hemodilution 5. AKI on admission 6. Hypo kalemia-replaced with drip and discontinued  replacement 7/3 a. Saline saline lock 7/4, AKI is resolved b. Foley discontinued 7/3 AM 7. Syncope with negative CT head a. Mechanism of fall unclear but she is coherent cognizant b. Hematoma is stable at this time 8. Mild leukocytosis on admission a. See above discussion 9. A. fib CHADS2 score >4 followed by Dr. Domenic Polite not on anticoagulation secondary to prior anemia/GI bleed a. Stable on monitors-transferring to telemetry today b. Mainly in sinus rhythm per review  10. Bipolar a. Continuing Xanax one nightly sleep,  b. Cymbalta specialized dosing-relatively high dose for 86 year old female-needs to be discussed in the outpatient with PCP regarding tapering or cutting back especially in the setting of fall which brought her in with hip fracture c. Primidone fifty at bedtime sleep 11. Moderate malnutrition 12. Prior SBO/short gut syndrome a. Circumstances unclear continue B12 13. Compensated diastolic dysfunction a. Not on diuretics at this time continue Lopressor 25 twice daily  DVT prophylaxis: SCDs TED hose and aspirin Code Status: Full confirmed at the bedside Family Communication: Discussed on telephone with niece [name] 06/18/2020  [phone] Disposition:  Status is: Inpatient Remains inpatient appropriate because:Ongoing active pain requiring inpatient pain management and Unsafe d/c plan  Dispo: The patient is from: Home              Anticipated d/c is to: SNF              Anticipated d/c date is: 3 days              Patient currently is not medically stable to d/c.   Consultants:   Orthopedics none  Procedures: None  Antimicrobials: None   Subjective: 3/10 pain No nausea no vomiting Niece reports she has been having some burning in her  urine and tingling of fingers-remains on antibiotics No chest pain   Vitals:   06/18/20 0500 06/18/20 0600 06/18/20 0800 06/18/20 1000  BP:  (!) 120/55 (!) 151/61 (!) 159/85  Pulse: 72 74 79 91  Resp: 19 19 20 18   Temp:    98.5 F (36.9 C)   TempSrc:   Oral   SpO2: 95% 95% 100% 97%  Weight:      Height:        Intake/Output Summary (Last 24 hours) at 06/18/2020 1215 Last data filed at 06/17/2020 2130 Gross per 24 hour  Intake --  Output 150 ml  Net -150 ml   Filed Weights   06/14/20 1116 06/15/20 1459  Weight: 54.4 kg 54.4 kg    Examination:  General exam: Alert oriented X4 Respiratory system: Clear no rales rhonchi Cardiovascular system: S1-S2 sinus rhythm on monitors holosystolic murmur R [facility] Gastrointestinal system: Soft nontender no rebound no guarding. Central nervous system: Neurologically grossly intact Extremities stable Skin: As above Psychiatry: Euthymic congruent  Data Reviewed: I have personally reviewed following labs and imaging studies BUN/creatinine  35/1.91-->27/1.8-->33/1.86-->30/1.6 White count 14.1-->8.0-->7.2-->7.3 Hemoglobin 10.4-->6.4-->9.4-->7.9-->7.9 Platelet 174-->86-->102-->90-->108  Radiology Studies: No results found.   Scheduled Meds: . aspirin  81 mg Oral BID  . azithromycin  500 mg Oral Daily  . Chlorhexidine Gluconate Cloth  6 each Topical Daily  . docusate sodium  100 mg Oral BID  . DULoxetine  90 mg Oral q morning - 10a  . levothyroxine  75 mcg Oral QAC breakfast  . mouth rinse  15 mL Mouth Rinse BID  . metoprolol succinate  25 mg Oral Daily  . mupirocin ointment  1 application Nasal BID  . pantoprazole  40 mg Oral BID  . polyethylene glycol  17 g Oral Daily  . primidone  50 mg Oral QHS   Continuous Infusions: . sodium chloride 10 mL/hr at 06/17/20 1404  . cefTRIAXone (ROCEPHIN)  IV Stopped (06/17/20 2315)  . methocarbamol (ROBAXIN) IV       LOS: 4 days    Time spent: [place], MD [facility] To contact the attending provider between 7A-7P or the covering provider during after hours 7P-7A, please log into the web site [email or web address] and access using universal [place] password for that web site. If you do not have  the password, please call the hospital operator.  06/18/2020, 12:15 PM

## 2020-06-19 ENCOUNTER — Encounter (HOSPITAL_COMMUNITY): Payer: Self-pay | Admitting: Orthopaedic Surgery

## 2020-06-19 LAB — CBC WITH DIFFERENTIAL/PLATELET
Abs Immature Granulocytes: 0.04 10*3/uL (ref 0.00–0.07)
Basophils Absolute: 0 10*3/uL (ref 0.0–0.1)
Basophils Relative: 0 %
Eosinophils Absolute: 0.8 10*3/uL — ABNORMAL HIGH (ref 0.0–0.5)
Eosinophils Relative: 12 %
HCT: 22.4 % — ABNORMAL LOW (ref 36.0–46.0)
Hemoglobin: 7.2 g/dL — ABNORMAL LOW (ref 12.0–15.0)
Immature Granulocytes: 1 %
Lymphocytes Relative: 16 %
Lymphs Abs: 1.1 10*3/uL (ref 0.7–4.0)
MCH: 29.9 pg (ref 26.0–34.0)
MCHC: 32.1 g/dL (ref 30.0–36.0)
MCV: 92.9 fL (ref 80.0–100.0)
Monocytes Absolute: 1 10*3/uL (ref 0.1–1.0)
Monocytes Relative: 15 %
Neutro Abs: 3.8 10*3/uL (ref 1.7–7.7)
Neutrophils Relative %: 56 %
Platelets: 115 10*3/uL — ABNORMAL LOW (ref 150–400)
RBC: 2.41 MIL/uL — ABNORMAL LOW (ref 3.87–5.11)
RDW: 14.9 % (ref 11.5–15.5)
WBC: 6.8 10*3/uL (ref 4.0–10.5)
nRBC: 0 % (ref 0.0–0.2)

## 2020-06-19 LAB — COMPREHENSIVE METABOLIC PANEL
ALT: 12 U/L (ref 0–44)
AST: 19 U/L (ref 15–41)
Albumin: 2.3 g/dL — ABNORMAL LOW (ref 3.5–5.0)
Alkaline Phosphatase: 52 U/L (ref 38–126)
Anion gap: 10 (ref 5–15)
BUN: 28 mg/dL — ABNORMAL HIGH (ref 8–23)
CO2: 19 mmol/L — ABNORMAL LOW (ref 22–32)
Calcium: 7.4 mg/dL — ABNORMAL LOW (ref 8.9–10.3)
Chloride: 110 mmol/L (ref 98–111)
Creatinine, Ser: 1.66 mg/dL — ABNORMAL HIGH (ref 0.44–1.00)
GFR calc Af Amer: 31 mL/min — ABNORMAL LOW (ref >60)
GFR calc non Af Amer: 26 mL/min — ABNORMAL LOW (ref >60)
Glucose, Bld: 103 mg/dL — ABNORMAL HIGH (ref 70–99)
Potassium: 3.8 mmol/L (ref 3.5–5.1)
Sodium: 139 mmol/L (ref 135–145)
Total Bilirubin: 0.5 mg/dL (ref 0.3–1.2)
Total Protein: 5 g/dL — ABNORMAL LOW (ref 6.5–8.1)

## 2020-06-19 LAB — PREPARE RBC (CROSSMATCH)

## 2020-06-19 LAB — SARS CORONAVIRUS 2 (TAT 6-24 HRS): SARS Coronavirus 2: NEGATIVE

## 2020-06-19 LAB — HEMOGLOBIN AND HEMATOCRIT, BLOOD
HCT: 30.8 % — ABNORMAL LOW (ref 36.0–46.0)
Hemoglobin: 10.1 g/dL — ABNORMAL LOW (ref 12.0–15.0)

## 2020-06-19 MED ORDER — DULOXETINE HCL 30 MG PO CPEP: 90.0000 mg | ORAL_CAPSULE | Freq: Every morning | ORAL | 0 refills | Status: DC

## 2020-06-19 MED ORDER — FUROSEMIDE 10 MG/ML IJ SOLN
20.0000 mg | Freq: Once | INTRAMUSCULAR | Status: AC
  Administered 2020-06-19: 20 mg via INTRAVENOUS
  Filled 2020-06-19: qty 2

## 2020-06-19 MED ORDER — ACETAMINOPHEN 325 MG PO TABS
650.0000 mg | ORAL_TABLET | Freq: Once | ORAL | Status: AC
  Administered 2020-06-19: 650 mg via ORAL
  Filled 2020-06-19: qty 2

## 2020-06-19 MED ORDER — PRIMIDONE 50 MG PO TABS: 50.0000 mg | ORAL_TABLET | Freq: Every day | ORAL | 0 refills | Status: DC

## 2020-06-19 MED ORDER — SODIUM CHLORIDE 0.9% IV SOLUTION: Freq: Once | INTRAVENOUS | Status: AC

## 2020-06-19 MED ORDER — METOPROLOL SUCCINATE ER 25 MG PO TB24: 25.0000 mg | ORAL_TABLET | Freq: Every day | ORAL | 0 refills | Status: DC

## 2020-06-19 MED ORDER — DIPHENHYDRAMINE HCL 25 MG PO CAPS
25.0000 mg | ORAL_CAPSULE | Freq: Once | ORAL | Status: AC
  Administered 2020-06-19: 25 mg via ORAL
  Filled 2020-06-19: qty 1

## 2020-06-19 MED ORDER — ALPRAZOLAM 1 MG PO TABS: 1.0000 mg | ORAL_TABLET | Freq: Every evening | ORAL | 0 refills | Status: DC | PRN

## 2020-06-19 NOTE — Progress Notes (Signed)
Occupational Therapy Treatment Patient Details Name: Jane Andrews MRN: [id number] DOB: 05-07-1925 Today's Date: 06/19/2020    History of present illness 86 year old woman with known history of HTN, hypothyroid, reflux, breast cancer status post mastectomy, paroxysmal A. fib not on anticoagulation secondary to prior GI bleed/normocytic anemia. History of short gut syndrome with bowel adhesions in the past and SBO in 2013. Patient sustained a accidental fall 06/14/2020-found to have right hip fracture which was repaired 7/1. Postoperatively 7/2 developed hypoxic respiratory failure and transferred to stepdown with possible pneumonia versus atelectasis.   OT comments  Patient progressing and showed improved bed mobility, ability to perform functional transfers, and increased RT shoulder ROM after education in self [facility] program, compared to previous session. Patient limited by mild pain, debility and recent fall and surgery along with deficits noted below. Pt continues to demonstrate good rehab potential and would benefit from continued skilled OT to increase safety and independence with ADLs and functional transfers to allow pt to return home safely and reduce caregiver burden and fall risk.   Follow Up Recommendations  SNF    Equipment Recommendations   (Defer to post-acute recommendations.)    Recommendations for Other Services      Precautions / Restrictions Precautions Precautions: Fall Precaution Comments: monitor VS Restrictions Weight Bearing Restrictions: No Other Position/Activity Restrictions: WBAT       Mobility Bed Mobility Overal bed mobility: Needs Assistance       Supine to sit: Min assist (To advance RLE off bed. HOB partially elevated.)        Transfers Overall transfer level: Needs assistance Equipment used: Rolling walker (2 wheeled) Transfers: Sit to/from [facility] Sit to Stand: Min assist Stand pivot transfers: Min assist             Balance Overall balance assessment: Needs assistance Sitting-balance support: No upper extremity supported Sitting balance-Leahy Scale: Good     Standing balance support: Bilateral upper extremity supported Standing balance-Leahy Scale: Fair Standing balance comment: RW used to assess standing balance.                           ADL either performed or assessed with clinical judgement   ADL Overall ADL's : Needs assistance/impaired     Grooming: Min guard;Standing;Minimal assistance Grooming Details (indicate cue type and reason): Pt performed 5 min stand at sink performing hand and face hygiene as well as combing hair.  Min As needed to complete hair due to decreased RT shoulder ROM since fall. All else with contact guard for safety. Pt ed on how to position walker anterior to sink for safety.                 Toilet Transfer: RW;Min guard;Cueing for Office manager Details (indicate cue type and reason): Cues for hand placement when descending to recliner.         Functional mobility during ADLs: Min guard;Rolling walker;Cueing for safety                         Cognition Arousal/Alertness: Awake/alert Behavior During Therapy: WFL for tasks assessed/performed Overall Cognitive Status: Within Functional Limits for tasks assessed                                          Exercises Shoulder Exercises Shoulder  Flexion: AAROM;Self ROM;Seated;10 reps (Max recliner chair for scapular stabilization during self AAROM to BUEs with pt demonstrating Bilateral Shoulder flexion WFL, hands clasped,  without increased pain.)     Pt educated to stop exercises if pain increased more than 1 number and rest/ice as needed. Educated to perform exercises in supine or max reclined to support scapulae and decrease pain.      General Comments      Pertinent Vitals/ Pain       Pain Assessment: 0-10 Pain Score: 3  Pain Location: R hip and  Rt shoulder when elevating RT arm. Pain Descriptors / Indicators: Aching;Sore Pain Intervention(s): Repositioned;Monitored during session;Limited activity within patient's tolerance                                                          Frequency  Min 2X/week        Progress Toward Goals  OT Goals(current goals can now be found in the care plan section)  Progress towards OT goals: Progressing toward goals  Acute Rehab OT Goals Patient Stated Goal: To be independent again OT Goal Formulation: With patient/family Time For Goal Achievement: 06/30/20 Potential to Achieve Goals: Good  Plan Discharge plan remains appropriate    Co-evaluation          OT goals addressed during session: ADL's and self-care;Strengthening/ROM      AM-PAC OT "6 Clicks" Daily Activity     Outcome Measure   Help from another person eating meals?: None Help from another person taking care of personal grooming?: A Little Help from another person toileting, which includes using toliet, bedpan, or urinal?: A Lot Help from another person bathing (including washing, rinsing, drying)?: A Lot Help from another person to put on and taking off regular upper body clothing?: A Little Help from another person to put on and taking off regular lower body clothing?: A Lot 6 Click Score: 16    End of Session Equipment Utilized During Treatment: Gait belt;Rolling walker  OT Visit Diagnosis: Unsteadiness on feet (R26.81);Other abnormalities of gait and mobility (R26.89);Muscle weakness (generalized) (M62.81);Pain;History of falling (Z91.81) Pain - Right/Left: Right Pain - part of body: Shoulder;Hip   Activity Tolerance Patient tolerated treatment well   Patient Left in chair;with call bell/phone within reach;with chair alarm set;with family/visitor present   Nurse Communication Other (comment) (Nurse cleared OT to see pt.)        Time: 2330-0762 OT Time Calculation (min): 40  min  Charges: OT Treatments $Self Care/Home Management : 8-22 mins $Therapeutic Activity: 8-22 mins $Therapeutic Exercise: 8-22 mins  Anderson Malta, [place] Office: [phone] 06/19/2020    Julien Girt 06/19/2020, 11:05 AM

## 2020-06-19 NOTE — Discharge Summary (Signed)
Physician Discharge Summary  Jane Andrews WNI:[id number] DOB: [birth year] DOA: 06/14/2020  PCP: Redmond School, MD  Admit date: 06/14/2020 Discharge date: 06/19/2020  Time spent: 35 minutes  Recommendations for Outpatient Follow-up:  1. Check CBC as well as Chem-12 in about 1 week at skilled facility 2. Weightbearing as tolerated DVT prophylaxis aspirin 81 twice daily and follow-up Dr. Ninfa Linden 2 weeks post discharge probably around 07/04/2020  Discharge Diagnoses:  Principal Problem:   Closed right hip fracture ([place]) Active Problems:   Hypertension   CKD (chronic kidney disease) stage 3, GFR 30-59 ml/min (HCC)   Hypothyroid   Atrial fibrillation (HCC)   IDA (iron deficiency anemia)   Discharge Condition: improved  Diet recommendation: Regular  Filed Weights   06/14/20 1116 06/15/20 1459  Weight: 54.4 kg 54.4 kg    History of present illness:  86 year old WF HTN, hypothyroid, reflux, Breast cancer status post mastectomy Paroxysmal A. 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 Ninfa Linden  Perioperatively 7/2 developed hypoxic respiratory failure and transferred to stepdown with possible pneumonia versus atelectasis She then dropped her hemoglobin and needed transfusion of 2 units PRBC and remains on stepdown unit   Hospital Course:  1. Aspiration pneumonia a. CXR 7/2 PM shows aspiration b. Continue ceftriaxone/azithromycin through 06/19/2020, I-S, pulmonary hygiene c. No antibiotic further after discharge but does need an x-ray in about 3 to 4 weeks at skilled facility 2. Right hip fracture a. Will need  skilled care on discharge and is stable for discharge when hemoglobin is improved b. Continue oral opiates as more durable duration-Rx given on discharge c. Antiplatelet agent aspirin 81 twice daily secondary to prior bleeding 3. Acute blood loss anemia from surgery and hemodilution 4.  mild thrombocytopenia a. Transfused 7/3 2 PRBC-h dropped again on 7/6 and transfused 1 more unit PRBC 7/6-if hemoglobin is stable 7/7 can discharge to facility b. Wounds are clean per surgeon c. No heparinoid's continue aspirin 81 twice daily i. Thrombocytopenia was likely secondary to dilution and has resolved 5. AKI on admission 6. Hypo kalemia-replaced with drip and discontinued replacement 7/3 a. Saline saline lock 7/4, AKI is resolved in addition b. Foley discontinued 7/3 AM 7. Syncope with negative CT head a. Mechanism of fall unclear but she is coherent cognizant b. Hematoma is stable at this time 8. Mild leukocytosis on admission a. See above discussion 9. A. fib CHADS2 score >4 followed by Dr. Domenic Polite not on anticoagulation secondary to prior anemia/GI bleed a. Stable on monitors-transferring to telemetry today b. Mainly in sinus rhythm per review  10. Bipolar a. Continuing Xanax one nightly sleep,  b. Cymbalta specialized dosing-relatively high dose for 86 year old female-needs to be discussed in the outpatient with PCP regarding tapering or cutting back especially in the setting of fall which brought her in with hip fracture c. Primidone fifty at bedtime sleep 11. Moderate malnutrition 12. Prior SBO/short gut syndrome a. Circumstances unclear continue B12 13. Compensated diastolic dysfunction a. Not on diuretics at this time b. Lopressor changed to XL formulation on discharged 25 mg daily   Procedures:  Hip repair Dr. Ninfa Linden orthopedics   Consultations:  Dr. Ninfa Linden  Discharge Exam: Vitals:   06/19/20  1220 06/19/20 1235  BP:  (!) 176/79  Pulse: 86 80  Resp: (!) 21 18   Temp:  98.2 F (36.8 C)  SpO2: 99% 98%    General: Awake alert coherent no distress sitting up in bed no new issues Cardiovascular: S1-S2 no murmur rub or gallop Respiratory: Clinically clear no added sound no rales no rhonchi Abdomen soft no murmur rub or gallop Neurologically intact no focal deficit ROM intact  Discharge Instructions   Discharge Instructions    Diet - low sodium heart healthy   Complete by: As directed    Increase activity slowly   Complete by: As directed    Leave dressing on - Keep it clean, dry, and intact until clinic visit   Complete by: As directed      Allergies as of 06/19/2020      Reactions   Codeine Nausea And Vomiting      Medication List    STOP taking these medications   diclofenac 50 MG tablet Commonly known as: CATAFLAM   metoprolol tartrate 25 MG tablet Commonly known as: LOPRESSOR   multivitamin tablet   PROBIOTIC DAILY PO   traMADol 50 MG tablet Commonly known as: ULTRAM     TAKE these medications   alendronate 70 MG tablet Commonly known as: FOSAMAX Take 70 mg by mouth once a week. Take with a full glass of water on an empty stomach.   ALPRAZolam 1 MG tablet Commonly known as: XANAX Take 1 tablet (1 mg total) by mouth at bedtime as needed for sleep.   aspirin 81 MG chewable tablet Chew 1 tablet (81 mg total) by mouth 2 (two) times daily.   DULoxetine 30 MG capsule Commonly known as: CYMBALTA Take 3 capsules (90 mg total) by mouth every morning. Start taking on: June 20, 2020 What changed:   medication strength  how much to take  additional instructions  Another medication with the same name was removed. Continue taking this medication, and follow the directions you see here.   FLINTSTONES W/IRON PO Take by mouth daily.   HYDROcodone-acetaminophen 5-325 MG tablet Commonly known as: NORCO/VICODIN Take 1 tablet by mouth every 6 (six) hours as needed for moderate pain (pain score 4-6).   levothyroxine 75  MCG tablet Commonly known as: SYNTHROID Take 75 mcg by mouth daily before breakfast.   metoprolol succinate 25 MG 24 hr tablet Commonly known as: TOPROL-XL Take 1 tablet (25 mg total) by mouth daily. Start taking on: June 20, 2020   pantoprazole 40 MG tablet Commonly known as: PROTONIX Take 40 mg by mouth 2 (two) times daily.   primidone 50 MG tablet Commonly known as: MYSOLINE Take 1 tablet (50 mg total) by mouth at bedtime.   VITAMIN B-12 IJ Inject as directed every 30 (thirty) days.            Durable Medical Equipment  (From admission, onward)         Start     Ordered   06/15/20 1826  DME 3 n 1  Once        06/15/20 1825   06/15/20 1826  DME Walker rolling  Once       Question Answer Comment  Walker: With 5 Inch Wheels   Patient needs a walker to treat with the following condition Status post total replacement of right hip      06/15/20 1825           Discharge Care Instructions  (From admission, onward)  Start     Ordered   06/19/20 0000  Leave dressing on - Keep it clean, dry, and intact until clinic visit        06/19/20 1410         Allergies  Allergen Reactions  . Codeine Nausea And Vomiting    Contact information for follow-up providers    Mcarthur Rossetti, MD. Schedule an appointment as soon as possible for a visit in 2 week(s).   Specialty: Orthopedic Surgery Contact information: [place] [address] [phone]            Contact information for after-discharge care    [facility] .   Service: Skilled Nursing Contact information: [address] [place] [phone]                   The results of significant diagnostics from this hospitalization (including imaging, microbiology, ancillary and laboratory) are listed below for reference.    Significant Diagnostic Studies: DG Shoulder Right  Result Date:  06/14/2020 CLINICAL DATA:  Right shoulder pain after a fall today. EXAM: RIGHT SHOULDER - 2+ VIEW COMPARISON:  06/24/2008 FINDINGS: No acute fracture or dislocation is identified. Degenerative changes are again noted at the greater tuberosity with subchondral cyst formation. Surgical clips are present in the axilla. IMPRESSION: No acute osseous abnormality. Electronically Signed   By: Logan Bores M.D.   On: 06/14/2020 14:10   DG Elbow Complete Right  Result Date: 06/14/2020 CLINICAL DATA:  Right elbow pain after fall today. EXAM: RIGHT ELBOW - COMPLETE 3+ VIEW COMPARISON:  None. FINDINGS: There is no evidence of fracture, dislocation, or joint effusion. There is no evidence of arthropathy or other focal bone abnormality. Soft tissues are unremarkable. IMPRESSION: Negative. Electronically Signed   By: Marijo Conception M.D.   On: 06/14/2020 14:10   CT Head Wo Contrast  Result Date: 06/14/2020 CLINICAL DATA:  Headache following fall EXAM: CT HEAD WITHOUT CONTRAST TECHNIQUE: Contiguous axial images were obtained from the base of the skull through the vertex without intravenous contrast. COMPARISON:  July 21, 2008 FINDINGS: Brain: Age related volume loss present. There is no intracranial mass, hemorrhage, extra-axial fluid collection, or midline shift. There is patchy small vessel disease in the centra semiovale bilaterally. No acute appearing infarct evident. Vascular: No hyperdense vessel. There is calcification in each carotid siphon region. Skull: The bony calvarium appears intact. There is diffuse apparent edema in the scalp muscles overlying the right temporal and posterior frontal bone regions. No associated mass in this area on noncontrast enhanced study. Sinuses/Orbits: Mucosal thickening noted in several ethmoid air cells. Other paranasal sinuses are clear. Orbits appear symmetric bilaterally. Other: Mastoid air cells are clear. IMPRESSION: 1. Enlargement of the scalp muscles overlying the posterior  frontal and temporal bones on the right. Question hematoma in these areas. Clinical assessment advised in this regard. Underlying bone appears intact. 2. Age related volume loss with patchy periventricular small vessel disease. No acute infarct. No mass or hemorrhage. 3.  There are foci of arterial vascular calcification. 4.  Mucosal thickening noted in several ethmoid air cells. Electronically Signed   By: Lowella Grip III M.D.   On: 06/14/2020 13:48   US RENAL  Result Date: 06/09/2020 CLINICAL DATA:  CKD, stage IV, history of right staghorn calculus EXAM: RENAL / URINARY TRACT ULTRASOUND COMPLETE COMPARISON:  ultrasound 03/01/2018, CT chest 08/24/2017, CT abdomen pelvis 04/12/2016 FINDINGS: Right  Kidney: Renal measurements: 8.2 x 3.6 x 4.1 cm = volume: 64.4 mL. Renal cortical echogenicity is within normal limits. There is a large posterior shadowing staghorn calculus compatible with previously demonstrated findings in the lower pole right kidney measuring up to 33 mm in maximal diameter. Difficult to fully ascertain given extent of posterior shadowing. No demonstrable hydronephrosis. No concerning renal mass. Left Kidney: Renal measurements: 8.8 x 3.7 x 3.7 = volume: 64 mL. Anechoic 3.2 x 3.1 x 3.3 cm cyst in the upper pole left kidney. Additional 1.4 x 1.2 x 1.0 cm cyst seen in the lower pole left kidney. Several additional small cysts seen on comparison imaging are not visualized on this exam. No visible shadowing calculus or hydronephrosis. Bladder: Appears normal for degree of bladder distention. Other: None. IMPRESSION: Staghorn calculus in the lower pole right kidney. Anechoic simple appearing cysts in the left kidney, largest in the upper pole measuring up to 3.3 cm. Electronically Signed   By: Lovena Le M.D.   On: 06/09/2020 22:00   DG Pelvis Portable  Result Date: 06/15/2020 CLINICAL DATA:  Status post right hip arthroplasty EXAM: PORTABLE PELVIS 1-2 VIEWS COMPARISON:  06/15/2020,  06/14/2020, renal ultrasound 06/08/2020 FINDINGS: Pubic symphysis and rami are intact. Interval right hip replacement with intact hardware and normal alignment. Gas within the soft tissues consistent with recent surgery. Large staghorn calculus overlying expected location of lower right kidney. Suture like opacities over the lower abdomen and pelvis as before. IMPRESSION: Interval right hip replacement with expected postsurgical changes. Electronically Signed   By: Donavan Foil M.D.   On: 06/15/2020 19:05   DG CHEST PORT 1 VIEW  Result Date: 06/15/2020 CLINICAL DATA:  Pneumonia, status post right hip surgery EXAM: PORTABLE CHEST 1 VIEW COMPARISON:  06/14/2020 FINDINGS: The heart size and mediastinal contours are within normal limits. There may be trace pleural effusions. No acute appearing airspace opacity. The visualized skeletal structures are unremarkable. IMPRESSION: There may be trace pleural effusions. No acute appearing airspace opacity. Electronically Signed   By: Eddie Candle M.D.   On: 06/15/2020 19:59   DG CHEST PORT 1 VIEW  Result Date: 06/14/2020 CLINICAL DATA:  Preoperative exam, closed right hip fracture EXAM: PORTABLE CHEST 1 VIEW COMPARISON:  CT 08/24/2017, radiograph 12/25/2018 FINDINGS: There is focal opacity in the right infrahilar lung, partially silhouetting portion of the right heart border, could reflect volume loss or consolidation in the right middle lobe, additional right lower lobe consolidation or atelectasis may be present as well. No pneumothorax or effusion. The cardiomediastinal contours are unremarkable. No acute osseous or soft tissue abnormality. Degenerative changes are present in the imaged spine and shoulders. Telemetry leads overlie the chest. Surgical clips in the right axilla. IMPRESSION: Focal opacity in the right infrahilar lung silhouetting portion of the right heart border, could reflect volume loss or consolidation in the right middle lobe and possibly right  lower lobe. Electronically Signed   By: Lovena Le M.D.   On: 06/14/2020 20:12   DG C-Arm 1-60 Min-No Report  Result Date: 06/15/2020 Fluoroscopy was utilized by the requesting physician.  No radiographic interpretation.   ECHOCARDIOGRAM COMPLETE  Result Date: 06/15/2020    ECHOCARDIOGRAM REPORT   Patient Name:   Jane Andrews Date of Exam: 06/15/2020 Medical Rec #:  [id number]           Height:       61.0 in Accession #:    [id number]  Weight:       120.0 lb Date of Birth:  [birth year]           BSA:          1.520 m Patient Age:    86 years            BP:           116/55 mmHg Patient Gender: F                   HR:           80 bpm. Exam Location:  Inpatient Procedure: 2D Echo Indications:    780.2 syncope  History:        Patient has prior history of Echocardiogram examinations, most                 recent 12/26/2018. Arrythmias:Atrial Fibrillation; Risk                 Factors:Hypertension and Former Smoker. AS.  Sonographer:    Jannett Celestine RDCS (AE) Referring Phys: [place]  1. Left ventricular ejection fraction, by estimation, is 60 to 65%. The left ventricle has normal function. The left ventricle has no regional wall motion abnormalities. There is mild asymmetric left ventricular hypertrophy of the basal-septal segment. Left ventricular diastolic parameters are consistent with Grade I diastolic dysfunction (impaired relaxation).  2. Right ventricular systolic function is normal. The right ventricular size is not well visualized. Tricuspid regurgitation signal is inadequate for assessing PA pressure.  3. The mitral valve is normal in structure. Trivial mitral valve regurgitation. No evidence of mitral stenosis.  4. The aortic valve is abnormal. Aortic valve regurgitation is not visualized. Mild aortic valve stenosis. Aortic valve mean gradient measures 12.0 mmHg.  5. The inferior vena cava is normal in size with <50% respiratory variability, suggesting right  atrial pressure of 8 mmHg. FINDINGS  Left Ventricle: Left ventricular ejection fraction, by estimation, is 60 to 65%. The left ventricle has normal function. The left ventricle has no regional wall motion abnormalities. The left ventricular internal cavity size was normal in size. There is  mild asymmetric left ventricular hypertrophy of the basal-septal segment. Left ventricular diastolic parameters are consistent with Grade I diastolic dysfunction (impaired relaxation). Right Ventricle: The right ventricular size is not well visualized. Right vetricular wall thickness was not assessed. Right ventricular systolic function is normal. Tricuspid regurgitation signal is inadequate for assessing PA pressure. Left Atrium: Left atrial size was normal in size. Right Atrium: Right atrial size was normal in size. Pericardium: There is no evidence of pericardial effusion. Mitral Valve: The mitral valve is normal in structure. There is moderate thickening of the mitral valve leaflet(s). Normal mobility of the mitral valve leaflets. Mild mitral annular calcification. Trivial mitral valve regurgitation. No evidence of mitral  valve stenosis. Tricuspid Valve: The tricuspid valve is normal in structure. Tricuspid valve regurgitation is not demonstrated. No evidence of tricuspid stenosis. Aortic Valve: The aortic valve is abnormal. Aortic valve regurgitation is not visualized. Mild aortic stenosis is present. There is severe calcifcation of the aortic valve. Aortic valve mean gradient measures 12.0 mmHg. Aortic valve peak gradient measures 19.0 mmHg. Aortic valve area, by VTI measures 1.14 cm. Pulmonic Valve: The pulmonic valve was grossly normal. Pulmonic valve regurgitation is trivial. No evidence of pulmonic stenosis. Aorta: The aortic root is normal in size and structure. Venous: The inferior vena cava is normal in size with less than 50%  respiratory variability, suggesting right atrial pressure of 8 mmHg. IAS/Shunts: The  interatrial septum was not well visualized.  LEFT VENTRICLE PLAX 2D LVIDd:         3.40 cm  Diastology LVIDs:         2.30 cm  LV e' lateral:   7.51 cm/s LV PW:         1.00 cm  LV E/e' lateral: 10.9 LV IVS:        0.90 cm  LV e' medial:    8.49 cm/s LVOT diam:     2.00 cm  LV E/e' medial:  9.6 LV SV:         46 LV SV Index:   30 LVOT Area:     3.14 cm  LEFT ATRIUM             Index       RIGHT ATRIUM           Index LA diam:        3.20 cm 2.11 cm/m  RA Area:     11.10 cm LA Vol (A2C):   48.0 ml 31.58 ml/m RA Volume:   23.10 ml  15.20 ml/m LA Vol (A4C):   28.7 ml 18.88 ml/m LA Biplane Vol: 39.4 ml 25.92 ml/m  AORTIC VALVE AV Area (Vmax):    1.21 cm AV Area (Vmean):   1.29 cm AV Area (VTI):     1.14 cm AV Vmax:           218.00 cm/s AV Vmean:          148.500 cm/s AV VTI:            0.400 m AV Peak Grad:      19.0 mmHg AV Mean Grad:      12.0 mmHg LVOT Vmax:         84.00 cm/s LVOT Vmean:        61.100 cm/s LVOT VTI:          0.145 m LVOT/AV VTI ratio: 0.36  AORTA Ao Root diam: 3.50 cm MITRAL VALVE MV Area (PHT): 3.63 cm     SHUNTS MV Decel Time: 209 msec     Systemic VTI:  0.14 m MV E velocity: 81.50 cm/s   Systemic Diam: 2.00 cm MV A velocity: 106.00 cm/s MV E/A ratio:  0.77 Cherlynn Kaiser MD Electronically signed by Cherlynn Kaiser MD Signature Date/Time: 06/15/2020/3:23:30 PM    Final    DG HIP OPERATIVE UNILAT W OR W/O PELVIS RIGHT  Result Date: 06/15/2020 CLINICAL DATA:  Right hip arthroplasty EXAM: OPERATIVE right HIP (WITH PELVIS IF PERFORMED) 3 VIEWS TECHNIQUE: Fluoroscopic spot image(s) were submitted for interpretation post-operatively. COMPARISON:  06/14/2020 FINDINGS: Three low resolution intraoperative spot views of the right hip. Total fluoroscopy time was 11 seconds. The images demonstrate of right hip replacement with normal alignment. IMPRESSION: Intraoperative fluoroscopic assistance provided during right hip replacement. Electronically Signed   By: Donavan Foil M.D.   On: 06/15/2020  19:02   DG Hip Unilat W or Wo Pelvis 2-3 Views Right  Result Date: 06/14/2020 CLINICAL DATA:  Right hip pain after fall today. EXAM: DG HIP (WITH OR WITHOUT PELVIS) 2-3V RIGHT COMPARISON:  None. FINDINGS: Moderately displaced fracture is seen involving proximal right femoral neck. Left hip is unremarkable. IMPRESSION: Moderately displaced proximal right femoral neck fracture. Electronically Signed   By: Marijo Conception M.D.   On: 06/14/2020 14:11    Microbiology: Recent Results (from the  past 240 hour(s))  SARS Coronavirus 2 by RT PCR (hospital order, performed in [facility] hospital lab) Nasopharyngeal Nasopharyngeal Swab     Status: None   Collection Time: 06/14/20  2:35 PM   Specimen: Nasopharyngeal Swab  Result Value Ref Range Status   SARS Coronavirus 2 NEGATIVE NEGATIVE Final    Comment: (NOTE) SARS-CoV-2 target nucleic acids are NOT DETECTED.  The SARS-CoV-2 RNA is generally detectable in upper and lower respiratory specimens during the acute phase of infection. The lowest concentration of SARS-CoV-2 viral copies this assay can detect is 250 copies / mL. A negative result does not preclude SARS-CoV-2 infection and should not be used as the sole basis for treatment or other patient management decisions.  A negative result may occur with improper specimen collection / handling, submission of specimen other than nasopharyngeal swab, presence of viral mutation(s) within the areas targeted by this assay, and inadequate number of viral copies (<250 copies / mL). A negative result must be combined with clinical observations, patient history, and epidemiological information.  Fact Sheet for Patients:   StrictlyIdeas.no  Fact Sheet for Healthcare Providers: BankingDealers.co.za  This test is not yet approved or  cleared by the [place] FDA and has been authorized for detection and/or diagnosis of SARS-CoV-2 by FDA under an  Emergency Use Authorization (EUA).  This EUA will remain in effect (meaning this test can be used) for the duration of the COVID-19 declaration under Section 564(b)(1) of the Act, 21 U.S.C. section 360bbb-3(b)(1), unless the authorization is terminated or revoked sooner.  Performed at [facility], [address], [place], [place] [address]   Surgical PCR screen     Status: None   Collection Time: 06/15/20  9:36 AM   Specimen: Nasal Mucosa; Nasal Swab  Result Value Ref Range Status   MRSA, PCR NEGATIVE NEGATIVE Final   Staphylococcus aureus NEGATIVE NEGATIVE Final    Comment: (NOTE) The Xpert SA Assay (FDA approved for NASAL specimens in patients 63 years of age and older), is one component of a comprehensive surveillance program. It is not intended to diagnose infection nor to guide or monitor treatment. Performed at [facility], [place] [address], [place], [place] [address]   SARS CORONAVIRUS 2 (TAT 6-24 HRS) Nasopharyngeal Nasopharyngeal Swab     Status: None   Collection Time: 06/18/20  6:20 PM   Specimen: Nasopharyngeal Swab  Result Value Ref Range Status   SARS Coronavirus 2 NEGATIVE NEGATIVE Final    Comment: (NOTE) SARS-CoV-2 target nucleic acids are NOT DETECTED.  The SARS-CoV-2 RNA is generally detectable in upper and lower respiratory specimens during the acute phase of infection. Negative results do not preclude SARS-CoV-2 infection, do not rule out co-infections with other pathogens, and should not be used as the sole basis for treatment or other patient management decisions. Negative results must be combined with clinical observations, patient history, and epidemiological information. The expected result is Negative.  Fact Sheet for Patients: SugarRoll.be  Fact Sheet for Healthcare Providers: https://www.woods-mathews.com/  This test is not yet approved or cleared by the [place] FDA and  has been  authorized for detection and/or diagnosis of SARS-CoV-2 by FDA under an Emergency Use Authorization (EUA). This EUA will remain  in effect (meaning this test can be used) for the duration of the COVID-19 declaration under Se ction 564(b)(1) of the Act, 21 U.S.C. section 360bbb-3(b)(1), unless the authorization is terminated or revoked sooner.  Performed at [facility], [place]  [address] [place], [place] [address]      Labs: Basic Metabolic Panel: Recent Labs  Lab 06/14/20 1313 06/14/20 2037 06/15/20 0503 06/15/20 0503 06/15/20 1730 06/16/20 0106 06/17/20 0037 06/18/20 0334 06/19/20 0251  NA   < >  --  136   < > 137 136 136 135 139  K   < >  --  4.9   < > 4.6 4.7 4.2 3.9 3.8  CL   < >  --  103  --   --  108 108 109 110  CO2   < >  --  25  --   --  21* 18* 20* 19*  GLUCOSE   < >  --  118*  --   --  117* 106* 92 103*  BUN   < >  --  35*  --   --  27* 33* 30* 28*  CREATININE   < >  --  1.91*  --   --  1.83* 1.86* 1.65* 1.66*  CALCIUM   < >  --  8.9  --   --  7.3* 7.2* 7.5* 7.4*  MG  --  1.9  --   --   --   --   --   --   --    < > = values in this interval not displayed.   Liver Function Tests: Recent Labs  Lab 06/18/20 0334 06/19/20 0251  AST 20 19  ALT 12 12  ALKPHOS 50 52  BILITOT 0.5 0.5  PROT 5.0* 5.0*  ALBUMIN 2.4* 2.3*   No results for input(s): LIPASE, AMYLASE in the last 168 hours. No results for input(s): AMMONIA in the last 168 hours. CBC: Recent Labs  Lab 06/14/20 1313 06/14/20 2037 06/16/20 0106 06/16/20 1109 06/17/20 0648 06/18/20 0334 06/19/20 0251  WBC 13.4*   < > 8.0 8.2 7.2 7.3 6.8  NEUTROABS 11.7*  --   --  6.2 4.6 4.0 3.8  HGB 10.2*   < > 6.4* 9.4* 7.9* 7.9* 7.2*  HCT 31.8*   < > 19.8* 28.5* 24.7* 24.6* 22.4*  MCV 92.7   < > 92.1 92.2 93.6 93.2 92.9  PLT 163   < > 86* 102* 90* 108* 115*   < > = values in this interval not displayed.   Cardiac Enzymes: No results for input(s): CKTOTAL, CKMB, CKMBINDEX, TROPONINI in the last 168  hours. BNP: BNP (last 3 results) No results for input(s): BNP in the last 8760 hours.  ProBNP (last 3 results) No results for input(s): PROBNP in the last 8760 hours.  CBG: Recent Labs  Lab 06/18/20 0823 06/18/20 1232 06/18/20 1617  GLUCAP 83 90 118*       Signed:  Nita Sells MD   [facility] 06/19/2020, 2:11 PM

## 2020-06-20 ENCOUNTER — Encounter: Payer: Self-pay | Admitting: Adult Health

## 2020-06-20 ENCOUNTER — Inpatient Hospital Stay
Admission: RE | Admit: 2020-06-20 | Discharge: 2020-07-12 | Disposition: A | Discharge status: Home / Self Care | Payer: Medicare Other | Source: Ambulatory Visit | Attending: Internal Medicine | Admitting: Internal Medicine

## 2020-06-20 ENCOUNTER — Non-Acute Institutional Stay (SKILLED_NURSING_FACILITY): Payer: Medicare Other | Admitting: Adult Health

## 2020-06-20 DIAGNOSIS — M419 Scoliosis, unspecified: Secondary | ICD-10-CM | POA: Diagnosis not present

## 2020-06-20 DIAGNOSIS — Z4789 Encounter for other orthopedic aftercare: Secondary | ICD-10-CM | POA: Diagnosis not present

## 2020-06-20 DIAGNOSIS — I480 Paroxysmal atrial fibrillation: Secondary | ICD-10-CM

## 2020-06-20 DIAGNOSIS — J690 Pneumonitis due to inhalation of food and vomit: Secondary | ICD-10-CM | POA: Diagnosis not present

## 2020-06-20 DIAGNOSIS — S72001P Fracture of unspecified part of neck of right femur, subsequent encounter for closed fracture with malunion: Secondary | ICD-10-CM

## 2020-06-20 DIAGNOSIS — I350 Nonrheumatic aortic (valve) stenosis: Secondary | ICD-10-CM | POA: Diagnosis not present

## 2020-06-20 DIAGNOSIS — I10 Essential (primary) hypertension: Secondary | ICD-10-CM | POA: Diagnosis not present

## 2020-06-20 DIAGNOSIS — E538 Deficiency of other specified B group vitamins: Secondary | ICD-10-CM

## 2020-06-20 DIAGNOSIS — D62 Acute posthemorrhagic anemia: Secondary | ICD-10-CM | POA: Diagnosis not present

## 2020-06-20 DIAGNOSIS — N179 Acute kidney failure, unspecified: Secondary | ICD-10-CM | POA: Diagnosis not present

## 2020-06-20 DIAGNOSIS — K912 Postsurgical malabsorption, not elsewhere classified: Secondary | ICD-10-CM | POA: Diagnosis not present

## 2020-06-20 DIAGNOSIS — E039 Hypothyroidism, unspecified: Secondary | ICD-10-CM

## 2020-06-20 DIAGNOSIS — F319 Bipolar disorder, unspecified: Secondary | ICD-10-CM | POA: Diagnosis not present

## 2020-06-20 DIAGNOSIS — K5909 Other constipation: Secondary | ICD-10-CM

## 2020-06-20 DIAGNOSIS — D509 Iron deficiency anemia, unspecified: Secondary | ICD-10-CM | POA: Diagnosis not present

## 2020-06-20 DIAGNOSIS — C50911 Malignant neoplasm of unspecified site of right female breast: Secondary | ICD-10-CM

## 2020-06-20 DIAGNOSIS — S72041D Displaced fracture of base of neck of right femur, subsequent encounter for closed fracture with routine healing: Secondary | ICD-10-CM | POA: Diagnosis not present

## 2020-06-20 DIAGNOSIS — R262 Difficulty in walking, not elsewhere classified: Secondary | ICD-10-CM | POA: Diagnosis not present

## 2020-06-20 DIAGNOSIS — M6281 Muscle weakness (generalized): Secondary | ICD-10-CM | POA: Diagnosis not present

## 2020-06-20 DIAGNOSIS — F411 Generalized anxiety disorder: Secondary | ICD-10-CM | POA: Diagnosis not present

## 2020-06-20 DIAGNOSIS — Z9181 History of falling: Secondary | ICD-10-CM | POA: Diagnosis not present

## 2020-06-20 DIAGNOSIS — N1832 Chronic kidney disease, stage 3b: Secondary | ICD-10-CM | POA: Diagnosis not present

## 2020-06-20 DIAGNOSIS — K219 Gastro-esophageal reflux disease without esophagitis: Secondary | ICD-10-CM

## 2020-06-20 DIAGNOSIS — M810 Age-related osteoporosis without current pathological fracture: Secondary | ICD-10-CM

## 2020-06-20 DIAGNOSIS — M2550 Pain in unspecified joint: Secondary | ICD-10-CM | POA: Diagnosis not present

## 2020-06-20 DIAGNOSIS — E440 Moderate protein-calorie malnutrition: Secondary | ICD-10-CM | POA: Diagnosis not present

## 2020-06-20 DIAGNOSIS — F339 Major depressive disorder, recurrent, unspecified: Secondary | ICD-10-CM | POA: Diagnosis not present

## 2020-06-20 DIAGNOSIS — I4891 Unspecified atrial fibrillation: Secondary | ICD-10-CM | POA: Diagnosis not present

## 2020-06-20 DIAGNOSIS — D696 Thrombocytopenia, unspecified: Secondary | ICD-10-CM | POA: Diagnosis not present

## 2020-06-20 DIAGNOSIS — I5032 Chronic diastolic (congestive) heart failure: Secondary | ICD-10-CM | POA: Diagnosis not present

## 2020-06-20 DIAGNOSIS — Z96641 Presence of right artificial hip joint: Secondary | ICD-10-CM

## 2020-06-20 DIAGNOSIS — G250 Essential tremor: Secondary | ICD-10-CM

## 2020-06-20 DIAGNOSIS — I132 Hypertensive heart and chronic kidney disease with heart failure and with stage 5 chronic kidney disease, or end stage renal disease: Secondary | ICD-10-CM | POA: Diagnosis not present

## 2020-06-20 DIAGNOSIS — R5381 Other malaise: Secondary | ICD-10-CM | POA: Diagnosis not present

## 2020-06-20 DIAGNOSIS — J9601 Acute respiratory failure with hypoxia: Secondary | ICD-10-CM | POA: Diagnosis not present

## 2020-06-20 DIAGNOSIS — Z741 Need for assistance with personal care: Secondary | ICD-10-CM | POA: Diagnosis not present

## 2020-06-20 DIAGNOSIS — M159 Polyosteoarthritis, unspecified: Secondary | ICD-10-CM | POA: Diagnosis not present

## 2020-06-20 DIAGNOSIS — F329 Major depressive disorder, single episode, unspecified: Secondary | ICD-10-CM | POA: Diagnosis not present

## 2020-06-20 DIAGNOSIS — Z7401 Bed confinement status: Secondary | ICD-10-CM | POA: Diagnosis not present

## 2020-06-20 DIAGNOSIS — E43 Unspecified severe protein-calorie malnutrition: Secondary | ICD-10-CM | POA: Diagnosis not present

## 2020-06-20 DIAGNOSIS — W19XXXA Unspecified fall, initial encounter: Secondary | ICD-10-CM | POA: Diagnosis not present

## 2020-06-20 LAB — BPAM RBC
Blood Product Expiration Date: 202107292359
ISSUE DATE / TIME: 202107061217
Unit Type and Rh: 5100

## 2020-06-20 LAB — TYPE AND SCREEN
ABO/RH(D): O POS
Antibody Screen: NEGATIVE
Unit division: 0

## 2020-06-20 NOTE — TOC Transition Note (Addendum)
Transition of Care [facility]) - CM/SW Discharge Note   Patient Details  Name: Jane Andrews MRN: [id number] Date of Birth: 06-04-1925  Transition of Care [facility]) CM/SW Contact:  Leeroy Cha, RN Phone Number: 06/20/2020, 11:21 AM TCT-Kerry at the [facility]/wcb /left message at [phone] that patient is ready for transfer. TCF-Kerry at [facility] room 156 Call reprot to [phone] ptar called for transportation at 1133 Clinical Narrative:         Barriers to Discharge: Continued Medical Work up   Patient Goals and CMS Choice Patient states their goals for this hospitalization and ongoing recovery are:: get better CMS Medicare.gov Compare Post Acute Care list provided to:: Patient Represenative (must comment) Choice offered to / list presented to : Adult Children  Discharge Placement              Patient chooses bed at: [facility] Patient to be transferred to facility by: EMS      Discharge Plan and Services In-house Referral: Clinical Social Work   Post Acute Care Choice: [place]                               Social Determinants of Health (SDOH) Interventions     Readmission Risk Interventions Readmission Risk Prevention Plan 06/15/2020  Medication Screening Complete  Transportation Screening Complete  Some recent data might be hidden

## 2020-06-20 NOTE — Progress Notes (Signed)
Physical Therapy Treatment Patient Details Name: Jane Andrews MRN: [id number] DOB: 1925/02/23 Today's Date: 06/20/2020    History of Present Illness 86 year old woman with known history of HTN, hypothyroid, reflux, breast cancer status post mastectomy, paroxysmal A. fib not on anticoagulation secondary to prior GI bleed/normocytic anemia. History of short gut syndrome with bowel adhesions in the past and SBO in 2013. Patient sustained a accidental fall 06/14/2020-found to have right hip fracture which was repaired 7/1. Postoperatively 7/2 developed hypoxic respiratory failure and transferred to stepdown with possible pneumonia versus atelectasis.    PT Comments    Pt OOB in recliner.  Assisted with amb in hallway with walker.  Avg RA was 96% and HR 74.  Pt feeling much better and plans to D/C to SNF for ST Rehab.   Follow Up Recommendations  SNF     Equipment Recommendations  None recommended by PT    Recommendations for Other Services       Precautions / Restrictions Precautions Precautions: Fall Restrictions Other Position/Activity Restrictions: WBAT    Mobility  Bed Mobility               General bed mobility comments: OOB in recliner  Transfers Overall transfer level: Needs assistance Equipment used: Rolling walker (2 wheeled) Transfers: Sit to/from [facility] Sit to Stand: Min guard Stand pivot transfers: Min guard;Min assist       General transfer comment: min guard with RW to ambulate.  Ambulation/Gait Ambulation/Gait assistance: Min assist Gait Distance (Feet): 92 Feet Assistive device: Rolling walker (2 wheeled) Gait Pattern/deviations: Decreased step length - right;Decreased step length - left;Shuffle;Trunk flexed;Step-to pattern;Step-through pattern Gait velocity: decr   General Gait Details: cues for sequence, posture and position from [employer] Mobility    Modified Rankin (Stroke  Patients Only)       Balance                                            Cognition   Behavior During Therapy: WFL for tasks assessed/performed Overall Cognitive Status: Within Functional Limits for tasks assessed                                 General Comments: AxO x 3 pleasant      Exercises      General Comments        Pertinent Vitals/Pain Pain Assessment: Faces Faces Pain Scale: Hurts a little bit Pain Location: R hip with activity Pain Descriptors / Indicators: Aching;Sore Pain Intervention(s): Monitored during session;Repositioned    Home Living                      Prior Function            PT Goals (current goals can now be found in the care plan section) Progress towards PT goals: Progressing toward goals    Frequency    Min 3X/week      PT Plan Current plan remains appropriate    Co-evaluation              AM-PAC PT "6 Clicks" Mobility   Outcome Measure  Help needed turning from your back to your side while in a flat bed without  using bedrails?: A Lot Help needed moving from lying on your back to sitting on the side of a flat bed without using bedrails?: A Lot Help needed moving to and from a bed to a chair (including a wheelchair)?: A Lot Help needed standing up from a chair using your arms (e.g., wheelchair or bedside chair)?: A Lot Help needed to walk in hospital room?: A Little Help needed climbing 3-5 steps with a railing? : A Lot 6 Click Score: 13    End of Session Equipment Utilized During Treatment: Gait belt Activity Tolerance: Patient tolerated treatment well Patient left: in chair;with call bell/phone within reach Nurse Communication: Mobility status PT Visit Diagnosis: Difficulty in walking, not elsewhere classified (R26.2);Pain Pain - Right/Left: Right Pain - part of body: Hip     Time: 1135-1145 PT Time Calculation (min) (ACUTE ONLY): 10 min  Charges:  $Gait Training:  8-22 mins                     Jane Andrews  PTA Acute  Rehabilitation Services Pager      [phone] Office      [phone]

## 2020-06-20 NOTE — Progress Notes (Signed)
PROGRESS NOTE    Jane Andrews  AST:[id number] DOB: 01-Nov-1925 DOA: 06/14/2020 PCP: Redmond School, MD    Chief Complaint  Patient presents with  . Fall    Brief Narrative:  86 year old WF HTN, hypothyroid, reflux, Breast cancer status post mastectomy Paroxysmal A. 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 Ninfa Linden  Perioperatively 7/2 developed hypoxic respiratory failure and transferred to stepdown with possible pneumonia versus atelectasis She then dropped her hemoglobin and needed transfusion of 2 units PRBC and remains on stepdown unit. Patient improved clinically, had repair of right hip.  Remained stable.  Patient's hemoglobin noted to drop down to 7.2 on 06/19/2020 transfused a unit of packed red blood cells and hemoglobin was 10.1 posttransfusion on 06/19/2020. Patient currently medically stable for transfer/discharge to skilled nursing facility.   Assessment & Plan:   Principal Problem:   Closed right hip fracture (HCC) Active Problems:   Hypertension   CKD (chronic kidney disease) stage 3, GFR 30-59 ml/min (HCC)   Hypothyroid   Atrial fibrillation (HCC)   IDA (iron deficiency anemia)  1 closed right hip fracture Status post repair per orthopedics.  Patient needed transfusion of 1 unit packed red blood cells yesterday with repeat hemoglobin posttransfusion at 10.1.  Continue aspirin 81 mg twice daily for DVT prophylaxis per orthopedic recommendations as patient with prior history of bleeding.  Continue current pain regimen.  Outpatient follow-up with orthopedics.  2.  Aspiration pneumonia Chest x-ray  from 06/15/2020 was consistent with aspiration pneumonia.  Status post full treatment of antibiotics with Rocephin and azithromycin through 06/19/2020.  No further antibiotics needed.  Patient with O2 sats of greater than 95% on room air.  3.  Acute blood loss anemia, postoperatively and hemodilution Patient status post total of 3 units packed red blood cells during this hospitalization.  Patient got transfused a unit of packed red blood cells yesterday with posttransfusion hemoglobin at 10.1 yesterday evening.  Currently stable.  Will need repeat CBC done in 1 week.  Patient on aspirin for DVT prophylaxis.  Follow.  4.  Acute kidney injury On admission.  Resolved.  Renal function at baseline.  5.  Hypokalemia Needed.  6.  Syncope with negative head CT Mechanism of fall unclear.  Head CT unremarkable.  Patient treated for pneumonia.  Hematoma noted to be stable.  Outpatient follow-up.  7.  Paroxysmal A. Fib CHA2DS2VASC score > 4.  Rate currently controlled Proehl XL.  Patient currently in sinus rhythm.  Patient not on anticoagulation secondary to prior history of GI bleed/anemia.  Currently on aspirin for DVT prophylaxis.  Outpatient follow-up with cardiology.  8.  Bipolar disorder Stable.  Continue home regimen of Cymbalta, Xanax, primidone.  9.  Moderate protein calorie malnutrition/prior small bowel obstruction/short gut syndrome Stable.  Nutritional supplementation.  10.  Compensated diastolic heart failure Stable.  Currently on Toprol-XL.  Outpatient follow-up with cardiology.  11.  Hypothyroidism Continue Synthroid.   DVT prophylaxis: Aspirin/per orthopedics Code Status: Full Family Communication: Updated patient and niece at bedside. Disposition:   Status is: Inpatient    Dispo: The patient is from: Home  Anticipated d/c is to: SNF              Anticipated d/c date is: 06/20/2020              Patient currently medically stable for discharge to SNF  today.       Consultants:   Orthopedics: Dr. Ninfa Linden 06/15/2020  Procedures:   Right total hip arthroplasty per Dr. Ninfa Linden, orthopedics 06/15/2020  CT head 06/14/2020  Plain films of the pelvis 06/15/2020  Chest x-ray 06/14/2020, 06/15/2020  Plain films of the right shoulder 06/14/2020  Plain films of the right elbow 06/14/2020  Plain films of the right hip and pelvis 06/14/2020  2D echo 06/15/2020  Transfused 3 units packed red blood cells  Antimicrobials:  IV azithromycin 06/15/2020>>>> 06/16/2020  IV Rocephin 06/15/2020>>>>> 06/19/2020  Oral azithromycin 06/17/2020>>>> 77 2021   Subjective: Sitting up at the side of the bed watching television.  Niece at bedside.  Denies any chest pain.  No shortness of breath.  Denies any significant right hip pain.  Objective: Vitals:   06/20/20 0400 06/20/20 0512 06/20/20 0900 06/20/20 0905  BP:  (!) 102/48  (!) 147/80  Pulse:  69  80  Resp:  14  (!) 21  Temp: (!) 97.5 F (36.4 C)  97.6 F (36.4 C)   TempSrc: Oral  Oral   SpO2:  94%  96%  Weight:      Height:        Intake/Output Summary (Last 24 hours) at 06/20/2020 1038 Last data filed at 06/19/2020 1831 Gross per 24 hour  Intake 435 ml  Output 2300 ml  Net -1865 ml   Filed Weights   06/14/20 1116 06/15/20 1459  Weight: 54.4 kg 54.4 kg    Examination:  General exam: Appears calm and comfortable  Respiratory system: Clear to auscultation. Respiratory effort normal. Cardiovascular system: S1 & S2 heard, RRR. No JVD, murmurs, rubs, gallops or clicks. No pedal edema. Gastrointestinal system: Abdomen is nondistended, soft and nontender. No organomegaly or masses felt. Normal bowel sounds heard. Central nervous system: Alert and oriented. No focal neurological deficits. Extremities: Symmetric 5 x 5 power. Skin: No rashes, lesions or ulcers Psychiatry: Judgement and insight appear normal. Mood & affect appropriate.     Data Reviewed: I have personally reviewed following labs and  imaging studies  CBC: Recent Labs  Lab 06/14/20 1313 06/14/20 2037 06/16/20 0106 06/16/20 0106 06/16/20 1109 06/17/20 0648 06/18/20 0334 06/19/20 0251 06/19/20 1654  WBC 13.4*   < > 8.0  --  8.2 7.2 7.3 6.8  --   NEUTROABS 11.7*  --   --   --  6.2 4.6 4.0 3.8  --   HGB 10.2*   < > 6.4*   < > 9.4* 7.9* 7.9* 7.2* 10.1*  HCT 31.8*   < > 19.8*   < > 28.5* 24.7* 24.6* 22.4* 30.8*  MCV 92.7   < > 92.1  --  92.2 93.6 93.2 92.9  --   PLT 163   < > 86*  --  102* 90* 108* 115*  --    < > = values in this interval not displayed.    Basic Metabolic Panel: Recent Labs  Lab 06/14/20 1313 06/14/20 2037 06/15/20 0503 06/15/20 0503 06/15/20 1730 06/16/20 0106 06/17/20 5638 06/18/20 0334 06/19/20 0251  NA   < >  --  136   < > 137 136 136 135 139  K   < >  --  4.9   < >  4.6 4.7 4.2 3.9 3.8  CL   < >  --  103  --   --  108 108 109 110  CO2   < >  --  25  --   --  21* 18* 20* 19*  GLUCOSE   < >  --  118*  --   --  117* 106* 92 103*  BUN   < >  --  35*  --   --  27* 33* 30* 28*  CREATININE   < >  --  1.91*  --   --  1.83* 1.86* 1.65* 1.66*  CALCIUM   < >  --  8.9  --   --  7.3* 7.2* 7.5* 7.4*  MG  --  1.9  --   --   --   --   --   --   --    < > = values in this interval not displayed.    GFR: Estimated Creatinine Clearance: 16.3 mL/min (A) (by C-G formula based on SCr of 1.66 mg/dL (H)).  Liver Function Tests: Recent Labs  Lab 06/18/20 0334 06/19/20 0251  AST 20 19  ALT 12 12  ALKPHOS 50 52  BILITOT 0.5 0.5  PROT 5.0* 5.0*  ALBUMIN 2.4* 2.3*    CBG: Recent Labs  Lab 06/18/20 0823 06/18/20 1232 06/18/20 1617  GLUCAP 83 90 118*     Recent Results (from the past 240 hour(s))  SARS Coronavirus 2 by RT PCR (hospital order, performed in [facility] hospital lab) Nasopharyngeal Nasopharyngeal Swab     Status: None   Collection Time: 06/14/20  2:35 PM   Specimen: Nasopharyngeal Swab  Result Value Ref Range Status   SARS Coronavirus 2 NEGATIVE NEGATIVE Final     Comment: (NOTE) SARS-CoV-2 target nucleic acids are NOT DETECTED.  The SARS-CoV-2 RNA is generally detectable in upper and lower respiratory specimens during the acute phase of infection. The lowest concentration of SARS-CoV-2 viral copies this assay can detect is 250 copies / mL. A negative result does not preclude SARS-CoV-2 infection and should not be used as the sole basis for treatment or other patient management decisions.  A negative result may occur with improper specimen collection / handling, submission of specimen other than nasopharyngeal swab, presence of viral mutation(s) within the areas targeted by this assay, and inadequate number of viral copies (<250 copies / mL). A negative result must be combined with clinical observations, patient history, and epidemiological information.  Fact Sheet for Patients:   StrictlyIdeas.no  Fact Sheet for Healthcare Providers: BankingDealers.co.za  This test is not yet approved or  cleared by the [place] FDA and has been authorized for detection and/or diagnosis of SARS-CoV-2 by FDA under an Emergency Use Authorization (EUA).  This EUA will remain in effect (meaning this test can be used) for the duration of the COVID-19 declaration under Section 564(b)(1) of the Act, 21 U.S.C. section 360bbb-3(b)(1), unless the authorization is terminated or revoked sooner.  Performed at [facility], [address] [place], [place] [address]   Surgical PCR screen     Status: None   Collection Time: 06/15/20  9:36 AM   Specimen: Nasal Mucosa; Nasal Swab  Result Value Ref Range Status   MRSA, PCR NEGATIVE NEGATIVE Final   Staphylococcus aureus NEGATIVE NEGATIVE Final    Comment: (NOTE) The Xpert SA Assay (FDA approved for NASAL specimens in patients 66 years of age and older), is one component of a comprehensive surveillance program.  It is not intended to diagnose infection nor to guide or  monitor treatment. Performed at [facility], [place] [address], [place], [place] [address]   SARS CORONAVIRUS 2 (TAT 6-24 HRS) Nasopharyngeal Nasopharyngeal Swab     Status: None   Collection Time: 06/18/20  6:20 PM   Specimen: Nasopharyngeal Swab  Result Value Ref Range Status   SARS Coronavirus 2 NEGATIVE NEGATIVE Final    Comment: (NOTE) SARS-CoV-2 target nucleic acids are NOT DETECTED.  The SARS-CoV-2 RNA is generally detectable in upper and lower respiratory specimens during the acute phase of infection. Negative results do not preclude SARS-CoV-2 infection, do not rule out co-infections with other pathogens, and should not be used as the sole basis for treatment or other patient management decisions. Negative results must be combined with clinical observations, patient history, and epidemiological information. The expected result is Negative.  Fact Sheet for Patients: SugarRoll.be  Fact Sheet for Healthcare Providers: https://www.woods-mathews.com/  This test is not yet approved or cleared by the [place] FDA and  has been authorized for detection and/or diagnosis of SARS-CoV-2 by FDA under an Emergency Use Authorization (EUA). This EUA will remain  in effect (meaning this test can be used) for the duration of the COVID-19 declaration under Se ction 564(b)(1) of the Act, 21 U.S.C. section 360bbb-3(b)(1), unless the authorization is terminated or revoked sooner.  Performed at [facility], [place] [address] [place], [place] [address]          Radiology Studies: No results found.      Scheduled Meds: . aspirin  81 mg Oral BID  . azithromycin  500 mg Oral Daily  . Chlorhexidine Gluconate Cloth  6 each Topical Daily  . DULoxetine  90 mg Oral q morning - 10a  . levothyroxine  75 mcg Oral QAC breakfast  . metoprolol succinate  25 mg Oral Daily  . pantoprazole  40 mg Oral BID  . polyethylene  glycol  17 g Oral Daily  . primidone  50 mg Oral QHS   Continuous Infusions: . sodium chloride 10 mL/hr at 06/17/20 1404  . cefTRIAXone (ROCEPHIN)  IV Stopped (06/19/20 2205)     LOS: 6 days    Time spent: 35 minutes    Irine Seal, MD [facility]   To contact the attending provider between 7A-7P or the covering provider during after hours 7P-7A, please log into the web site [email or web address] and access using universal [place] password for that web site. If you do not have the password, please call the hospital operator.  06/20/2020, 10:38 AM

## 2020-06-20 NOTE — Progress Notes (Signed)
Location:    [facility] Room Number: 156/W Place of Service:  SNF (31)   CODE STATUS: Full Code  Allergies  Allergen Reactions  . Codeine Nausea And Vomiting    Chief Complaint  Patient presents with  . Hospitalization Follow-up    Hospitalization Follow Up    HPI:  She is a 86 year old woman who has been hospitalized from 06-14-20 through 06-19-20. She had a fall at home fractured her right hip. She had a right hemiarthroplasty on 06-15-20. She had aspiration pneumonia while in the hospital and has completed abt. She has acute blood loss anemia and is status post transfusion. She is here for short term rehab with her goal to return back home. She denies any uncontrolled right hip pain. She states that her appetite is good. She denies any constipation. She lives alone and has 3 steps to get into her home. She does not have any dme at home. She does have some occasional help at home. She will continue to be followed for her chronic illnesses including: chronic kidney disease; hypertension afib.   Past Medical History:  Diagnosis Date  . Abdominal adhesions 1994  . Allergic rhinitis   . Anemia   . Anxiety and depression   . Aortic stenosis   . Arthritis   . Atrial fibrillation ([facility]) 10/2012   Associated with severe anemia and esophageal pill impaction  . Breast carcinoma ([facility])    Right mastectomy  . Cholelithiasis   . Essential hypertension   . Gastroesophageal reflux disease    Hiatal hernia  . History of blood transfusion   . Hypothyroidism   . Low back pain   . 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 Nephrolithiasis 2004   Painless hematuria  . Short gut syndrome    Bowel resection , 2004  . Upper GI bleed 2004   Multiple episodes of melena-? due to gastritis or adverse drug effect (nonsteroidals, small bowel ulceration with Fosamax); caused by Pepto-Bismol during one Emergency Department evaluation     Past Surgical History:  Procedure Laterality Date  . ABDOMINAL HYSTERECTOMY     emergency s/p delivery  . ABDOMINAL HYSTERECTOMY  1960   massive gynecologic bleeding  . BOWEL RESECTION     Resulting short gut syndrome  . CHOLECYSTECTOMY N/A 06/25/2018   Procedure: LAPAROSCOPIC CHOLECYSTECTOMY WITH INTRAOPERATIVE CHOLANGIOGRAM;  Surgeon: Armandina Gemma, MD;  Location: [facility];  Service: General;  Laterality: N/A;  . COLONOSCOPY W/ POLYPECTOMY  2005   Lipoma; diverticulosis  . COLONOSCOPY WITH ESOPHAGOGASTRODUODENOSCOPY (EGD)  11/22/2012   [facility]  . HIP ARTHROPLASTY Right 06/15/2020   Procedure: ANTERIOR ARTHROPLASTY BIPOLAR HIP (HEMIARTHROPLASTY);  Surgeon: Mcarthur Rossetti, MD;  Location: [facility];  Service: Orthopedics;  Laterality: Right;  . LAPAROSCOPIC LYSIS OF ADHESIONS  1965   s/p adhesions  . MASTECTOMY  right breast  . MASTECTOMY     Carcinoma of the breast; right  . UPPER GASTROINTESTINAL ENDOSCOPY      Social History   Socioeconomic History  . Marital status: Widowed    Spouse name: Not on file  . Number of children: Not on file  . Years of education: 38  . Highest education level: Not on file  Occupational History  . Not on file  Tobacco Use  . Smoking status: Former Smoker    Packs/day: 1.50    Years: 20.00    Pack years: 30.00    Types:  Cigarettes  . Smokeless tobacco: Never Used  Vaping Use  . Vaping Use: Never used  Substance and Sexual Activity  . Alcohol use: No  . Drug use: No  . Sexual activity: Never  Other Topics Concern  . Not on file  Social History Narrative  . Not on file   Social Determinants of Health   Financial Resource Strain:   . Difficulty of Paying Living Expenses:   Food Insecurity:   . Worried About Charity fundraiser in the Last Year:   . Arboriculturist in the Last Year:   Transportation Needs:   . Film/video editor (Medical):   Marland Kitchen Lack of Transportation (Non-Medical):   Physical Activity:   . Days of  Exercise per Week:   . Minutes of Exercise per Session:   Stress:   . Feeling of Stress :   Social Connections:   . Frequency of Communication with Friends and Family:   . Frequency of Social Gatherings with Friends and Family:   . Attends Religious Services:   . Active Member of Clubs or Organizations:   . Attends Archivist Meetings:   Marland Kitchen Marital Status:   Intimate Partner Violence:   . Fear of Current or Ex-Partner:   . Emotionally Abused:   Marland Kitchen Physically Abused:   . Sexually Abused:    Family History  Problem Relation Age of Onset  . Anuerysm Father   . Rheum arthritis Sister   . Healthy Sister   . COPD Sister   . Healthy Brother   . Cancer Other   . Colon cancer Neg Hx       VITAL SIGNS BP (!) 176/79   Pulse 80   Temp 98.2 F (36.8 C) (Oral)   Resp 18   Ht 5\' 1"  (1.549 m)   Wt 120 lb 2.4 oz (54.5 kg)   SpO2 98%   BMI 22.70 kg/m   Outpatient Encounter Medications as of 06/20/2020  Medication Sig  . alendronate (FOSAMAX) 70 MG tablet Take 70 mg by mouth once a week. Take with a full glass of water on an empty stomach.  . ALPRAZolam (XANAX) 1 MG tablet Take 1 tablet (1 mg total) by mouth at bedtime as needed for sleep.  Marland Kitchen aspirin 81 MG chewable tablet Chew 1 tablet (81 mg total) by mouth 2 (two) times daily.  . Cyanocobalamin (VITAMIN B-12 IJ) Inject as directed every 30 (thirty) days.  . DULoxetine (CYMBALTA) 30 MG capsule Take 3 capsules (90 mg total) by mouth every morning.  Marland Kitchen HYDROcodone-acetaminophen (NORCO/VICODIN) 5-325 MG tablet Take 1 tablet by mouth every 6 (six) hours as needed for moderate pain (pain score 4-6).  Marland Kitchen levothyroxine (SYNTHROID, LEVOTHROID) 75 MCG tablet Take 75 mcg by mouth daily before breakfast.  . metoprolol succinate (TOPROL-XL) 25 MG 24 hr tablet Take 1 tablet (25 mg total) by mouth daily.  . NON FORMULARY Diet: _____ Regular, ___x___ NAS, _______Consistent Carbohydrate, _______NPO _____Other  . pantoprazole (PROTONIX) 40  MG tablet Take 40 mg by mouth 2 (two) times daily.  . Pediatric Multivitamins-Iron Vicki Mallet W/IRON PO) Take by mouth daily.  . primidone (MYSOLINE) 50 MG tablet Take 1 tablet (50 mg total) by mouth at bedtime.     SIGNIFICANT DIAGNOSTIC EXAMS  TODAY  06-14-20: chest x-ray: Focal opacity in the right infrahilar lung silhouetting portion of the right heart border, could reflect volume loss or consolidation in the right middle lobe and possibly right lower lobe.  06-14-20:  right hip x-ray: Moderately displaced proximal right femoral neck fracture.   06-14-20; right elbow x-ray: There is no evidence of fracture, dislocation, or joint effusion. There is no evidence of arthropathy or other focal bone abnormality. Soft tissues are unremarkable.  06-14-20: right shoulder: No acute fracture or dislocation is identified. Degenerative changes are again noted at the greater tuberosity with subchondral cyst formation. Surgical clips are present in the axilla.  06-14-20: ct of head:  1. Enlargement of the scalp muscles overlying the posterior frontal and temporal bones on the right. Question hematoma in these areas. Clinical assessment advised in this regard. Underlying bone appears intact. 2. Age related volume loss with patchy periventricular small vessel disease. No acute infarct. No mass or hemorrhage. 3.  There are foci of arterial vascular calcification. 4.  Mucosal thickening noted in several ethmoid air cells.  06-15-20: chest x-ray: There may be trace pleural effusions. No acute appearing airspace opacity  LABS REVIEWED TODAY  06-14-20: wbc 13.4; hgb 10.2; hct 31.8; mcv 92.7 plt 163; glucose 129; bun 33; creat 1.62; k+ 3.6; na++ 136;ca 9.7 tsh 1.312 06-16-20: wbc 8.0; hgb 6.4; hct 19.8 mcv 92.1 plt 86; glucose 117; bun 27; creat 1.83; k+ 4.7; na++ 136; ca 7.3 06-18-20: wbc 7.3; hgb 7.9; hct 24.6; mcv 93.2 plt 108; glucose 92; bun 30; creat 1.65; k+ 3.9; na++ 135; ca 7.5 liver normal albumin 2.4 06-19-20:  hgb 10.1; hct 30.8;   Review of Systems  Constitutional: Negative for malaise/fatigue.  Respiratory: Negative for cough and shortness of breath.   Cardiovascular: Negative for chest pain, palpitations and leg swelling.  Gastrointestinal: Negative for abdominal pain, constipation and heartburn.  Musculoskeletal: Positive for joint pain. Negative for back pain and myalgias.       Hip pain   Skin: Negative.   Neurological: Negative for dizziness.  Psychiatric/Behavioral: The patient is not nervous/anxious.    Physical Exam Constitutional:      General: She is not in acute distress.    Appearance: She is well-developed. She is not diaphoretic.  Neck:     Thyroid: No thyromegaly.  Cardiovascular:     Rate and Rhythm: Normal rate and regular rhythm.     Pulses: Normal pulses.     Heart sounds: Murmur heard.   Pulmonary:     Effort: Pulmonary effort is normal. No respiratory distress.     Breath sounds: Normal breath sounds.  Abdominal:     General: Bowel sounds are normal. There is no distension.     Palpations: Abdomen is soft.     Tenderness: There is no abdominal tenderness.  Musculoskeletal:     Cervical back: Neck supple.     Right lower leg: No edema.     Left lower leg: No edema.     Comments: Able to move all extremities Is status post right hip hemiarthroplasty 06-15-20  Lymphadenopathy:     Cervical: No cervical adenopathy.  Skin:    General: Skin is warm and dry.     Comments: Incision line without signs of infection present.   Neurological:     Mental Status: She is alert and oriented to person, place, and time.  Psychiatric:        Mood and Affect: Mood normal.         ASSESSMENT/ PLAN:  TODAY  1. Closed fracture of right hip with malunion subsequent encounter: is stable will continue therapy as directed and will follow up with orthopedics; will continue vicodin 5/325 mg every 6  hours as needed through 06-25-20. Is on asa 81 mg twice daily will monitor    2. Stage 3b chronic kidney disease: is stable bun 30; creat 1.65   3. Paroxysmal atrial fibrillation: heart rate is stable; will continue asa 81 mg twice daily and toprol xl 25 mg daily for rate control  4. Gastroesophageal reflux disease without esophagitis: is stable will continue protonix 40 mg twice daily   5. Chronic constipation: is stable will continue to monitor   6. Hypothyroidism unspecified type: is stable tsh 1.312 will continue synthroid 75 mcg daily   7. Postoperative anemia due to acute blood loss/ iron deficiency unspecified iron deficiency type: is stable hgb 10.1 will monitor   8. Carcinoma of right female breast unspecified estrogen receptor status unspecified site of breast: is status post right mastectomy   9. Post menopausal osteoporosis: is without change in status: will continue fosamax 70 mg weekly   10. Essential tremor: is stable will continue primidone 50 mg nightly   11. Vitamin B 12 deficiency: is stable will continue monthly injections  12. Major depression recurrent chronic: is stable will continue cymbalta 90 mg daily has xanax 1mg  nightly as needed through 07-03-20  13. Essential hypertension: is stable will continue toprol xl 25 mg daily   14. Protein calorie malnutrition severe: albumin 2.4 will begin prostat twice daily   Will check cbc cmp    MD is aware of resident's narcotic use and is in agreement with current plan of care. We will attempt to wean resident as appropriate.  Ok Edwards NP [facility]  Contact [phone] Monday through Friday 8am- 5pm  After hours call [phone]

## 2020-06-20 NOTE — Progress Notes (Signed)
Occupational Therapy Treatment Patient Details Name: MARYETTA SHAFER MRN: [id number] DOB: 14-May-1925 Today's Date: 06/20/2020    History of present illness 86 year old woman with known history of HTN, hypothyroid, reflux, breast cancer status post mastectomy, paroxysmal A. fib not on anticoagulation secondary to prior GI bleed/normocytic anemia. History of short gut syndrome with bowel adhesions in the past and SBO in 2013. Patient sustained a accidental fall 06/14/2020-found to have right hip fracture which was repaired 7/1. Postoperatively 7/2 developed hypoxic respiratory failure and transferred to stepdown with possible pneumonia versus atelectasis.   OT comments  Patient demonstrates improving functional mobility and participation in ADLs. Patient able to ambulate to bathroom and stand at sink for grooming as well as perform most of toileting task. Cont to recommend Short term rehab at discharge.   Follow Up Recommendations  SNF    Equipment Recommendations  None recommended by OT    Recommendations for Other Services      Precautions / Restrictions Precautions Precautions: Fall Restrictions Other Position/Activity Restrictions: WBAT       Mobility Bed Mobility   Bed Mobility: Sit to Supine     Supine to sit: Supervision;HOB elevated        Transfers   Equipment used: Rolling walker (2 wheeled) Transfers: Sit to/from [facility] Sit to Stand: Min guard Stand pivot transfers: Min guard       General transfer comment: min guard with RW to ambulate.    Balance                                           ADL either performed or assessed with clinical judgement   ADL       Grooming: Min guard;Brushing hair;Wash/dry hands;Wash/dry face;Standing Grooming Details (indicate cue type and reason): Min guard to stand at sink and perform grooming.                 Toilet Transfer: Min guard;Cueing for Journalist, newspaper Details (indicate cue type and reason): Patient attempted to ambulate to bathroom but could not make it to toilet secondary to urgency. Patient min guard for [facility] transfer. Verbal cue for hand placement for safety. Toileting- Clothing Manipulation and Hygiene: Minimal assistance;Sit to/from stand Toileting - Clothing Manipulation Details (indicate cue type and reason): min assist for quality cleaning. patient able to perform wiping in seatedp position.             Vision       Perception     Praxis      Cognition Arousal/Alertness: Awake/alert Behavior During Therapy: WFL for tasks assessed/performed Overall Cognitive Status: Within Functional Limits for tasks assessed                                          Exercises     Shoulder Instructions       General Comments      Pertinent Vitals/ Pain       Faces Pain Scale: Hurts a little bit Pain Location: R shoulder witch active movement Pain Descriptors / Indicators: Aching;Sore  Home Living  Prior Functioning/Environment              Frequency  Min 2X/week        Progress Toward Goals  OT Goals(current goals can now be found in the care plan section)  Progress towards OT goals: Progressing toward goals  Acute Rehab OT Goals Patient Stated Goal: To be independent again OT Goal Formulation: With patient/family Time For Goal Achievement: 06/30/20 Potential to Achieve Goals: Good  Plan Discharge plan remains appropriate    Co-evaluation          OT goals addressed during session: ADL's and self-care      AM-PAC OT "6 Clicks" Daily Activity     Outcome Measure   Help from another person eating meals?: None Help from another person taking care of personal grooming?: A Little Help from another person toileting, which includes using toliet, bedpan, or urinal?: A Little Help from another person bathing (including  washing, rinsing, drying)?: A Lot Help from another person to put on and taking off regular upper body clothing?: A Little Help from another person to put on and taking off regular lower body clothing?: A Lot 6 Click Score: 17    End of Session Equipment Utilized During Treatment: Gait belt;Rolling walker  OT Visit Diagnosis: Unsteadiness on feet (R26.81);Other abnormalities of gait and mobility (R26.89);Muscle weakness (generalized) (M62.81);Pain;History of falling (Z91.81) Pain - Right/Left: Right Pain - part of body: Shoulder   Activity Tolerance Patient tolerated treatment well   Patient Left in chair;with call bell/phone within reach;with chair alarm set;with family/visitor present   Nurse Communication Mobility status        Time: 5072-2575 OT Time Calculation (min): 24 min  Charges: OT General Charges $OT Visit: 1 Visit OT Treatments $Self Care/Home Management : 23-37 mins  Tanaia Hawkey, OTR/L [facility]  Office [phone] Pager: [place] 06/20/2020, 9:58 AM

## 2020-06-21 DIAGNOSIS — F339 Major depressive disorder, recurrent, unspecified: Secondary | ICD-10-CM | POA: Insufficient documentation

## 2020-06-21 DIAGNOSIS — M810 Age-related osteoporosis without current pathological fracture: Secondary | ICD-10-CM | POA: Insufficient documentation

## 2020-06-21 DIAGNOSIS — G250 Essential tremor: Secondary | ICD-10-CM | POA: Insufficient documentation

## 2020-06-21 DIAGNOSIS — K5909 Other constipation: Secondary | ICD-10-CM | POA: Insufficient documentation

## 2020-06-21 DIAGNOSIS — E538 Deficiency of other specified B group vitamins: Secondary | ICD-10-CM | POA: Insufficient documentation

## 2020-06-22 ENCOUNTER — Non-Acute Institutional Stay (SKILLED_NURSING_FACILITY): Payer: Medicare Other | Admitting: Internal Medicine

## 2020-06-22 ENCOUNTER — Encounter: Payer: Self-pay | Admitting: Internal Medicine

## 2020-06-22 DIAGNOSIS — J690 Pneumonitis due to inhalation of food and vomit: Secondary | ICD-10-CM | POA: Insufficient documentation

## 2020-06-22 DIAGNOSIS — N179 Acute kidney failure, unspecified: Secondary | ICD-10-CM

## 2020-06-22 DIAGNOSIS — S72001P Fracture of unspecified part of neck of right femur, subsequent encounter for closed fracture with malunion: Secondary | ICD-10-CM

## 2020-06-22 DIAGNOSIS — D62 Acute posthemorrhagic anemia: Secondary | ICD-10-CM

## 2020-06-22 DIAGNOSIS — D696 Thrombocytopenia, unspecified: Secondary | ICD-10-CM

## 2020-06-22 DIAGNOSIS — N1832 Chronic kidney disease, stage 3b: Secondary | ICD-10-CM

## 2020-06-22 NOTE — Patient Instructions (Signed)
See assessment and plan under each diagnosis in the problem list and acutely for this visit 

## 2020-06-22 NOTE — Progress Notes (Signed)
NURSING HOME LOCATION:  [facility]  ROOM NUMBER:  [phone]  CODE STATUS: Full code  PCP: Redmond School, MD  This is a comprehensive admission note to [facility] performed on this date less than 30 days from date of admission. Included are preadmission medical/surgical history; reconciled medication list; family history; social history and comprehensive review of systems.  Corrections and additions to the records were documented. Comprehensive physical exam was also performed. Additionally a clinical summary was entered for each active diagnosis pertinent to this admission in the Problem List to enhance continuity of care.  HPI: Patient was hospitalized 7/1-06/19/2020 for right hip fracture sustained in a  fall.  Mechanism of the fall was unclear. Because of possible syncope; CT of the head was performed & was negative. Perioperative complication included hypoxic respiratory failure attributed to aspiration pneumonia.Ceftriaxone/azithromycin, incentive spirometry, and pulmonary toilet were implemented. Blood loss anemia necessitated transfusion of 2 units PRBCs.  Thrombocytopenia was also present felt to be related to hemodilution. Course was also complicated by AKI with creatinine up to 1.91.Creatinine improved to 1.61 @ D/C. Because of the blood loss anemia; antiplatelet therapy was limited to aspirin 81 mg twice daily.  She is on Xanax nightly as well as Cymbalta at 90 mg.  Pharmacy has expressed concerned about the high-dose Cymbalta especially with the unexplained etiology of the fall.  Additionally the dose is worrisome because of reduced creatinine clearance.  The latter is also a consideration for her Alendronate therapy.Also  Alendronate therapy in context of GERD increases risk of esophagitis.She states she has been on the bisphosphonate for @ least 10 years.  Past medical and surgical history: Includes hypothyroidism, bipolar disorder, essential hypertension, GERD,  history of renal calculi, history of cholelithiasis, history of breast cancer, PAF, history of aortic stenosis, and anxiety/depression. Surgical procedures include TAH, cholecystectomy, colonic polypectomy, hip arthroplasty, right mastectomy, and laparoscopic lysis of adhesions.  Social history: Nondrinker; former smoker with 30-pack-year consumption history.  Family history: History reviewed; noncontributory due to advanced age.   Review of systems: She gave me the correct date but asked me "what does Alendronate do?"  3 times.  She states that she feels "bad" and then laughed.  She described it as "down and out" and again laughed.  She describes nagging pain in her back and her hip as well as some persistent swelling of the right thigh.  Chronically she has decreased range of motion of the right upper extremity. She denies any definite cardiac or neurologic prodrome but questions whether she may have had loss of consciousness prior to the fall. As noted she states that she has been on alendronate for at least 10 years.  She has been on higher dose duloxetine at 90 mg for only 2 to 3 months.  She did seem to understand the need to reduce the duloxetine dose and stop the alendronate because of the progression of renal insufficiency & other potential risks.  Constitutional: No fever, significant weight change  Eyes: No redness, discharge, pain, vision change ENT/mouth: No nasal congestion, purulent discharge, earache, change in hearing, sore throat  Cardiovascular: No chest pain, palpitations, paroxysmal nocturnal dyspnea, claudication, edema  Respiratory: No cough, sputum production, hemoptysis, DOE, significant snoring, apnea  Gastrointestinal: No heartburn, dysphagia, abdominal pain, nausea /vomiting, rectal bleeding, melena, change in bowels Genitourinary: No dysuria, hematuria, pyuria, incontinence, nocturia Dermatologic: No rash, pruritus, change in appearance of skin Neurologic: No  dizziness, headache, seizures, numbness, tingling Psychiatric: No significant anxiety, depression, insomnia,  anorexia Endocrine: No change in hair/skin/nails, excessive thirst, excessive hunger, excessive urination  Hematologic/lymphatic: No significant bruising, lymphadenopathy, abnormal bleeding Allergy/immunology: No itchy/watery eyes, significant sneezing, urticaria, angioedema  Physical exam:  Pertinent or positive findings: She appears younger than her stated age. Affect as noted ( inappropriate laughing after describing potentially significant symptoms) . The left iris is darker than the right.  There may be subtle anisocoria with the left pupil minimally larger than the right.  Grade 1.5 brisk systolic murmur is noted with increased second heart sound.  Minor rales are noted at the bases.  The right thigh does appear larger than the left.  Pedal pulses are decreased, especially posterior tibial pulses.  There is trace edema at the sock line.  She is weak to opposition of the upper extremities.  The lower extremities were not tested for strength.  There is decreased elevation of the right upper extremity.  There is bruising over the dorsum of the right hand.  Interosseous wasting is present.  General appearance: no acute distress, increased work of breathing is present.   Lymphatic: No lymphadenopathy about the head, neck, axilla. Eyes: No conjunctival inflammation or lid edema is present. There is no scleral icterus. Ears:  External ear exam shows no significant lesions or deformities.   Nose:  External nasal examination shows no deformity or inflammation. Nasal mucosa are pink and moist without lesions, exudates Oral exam: Lips and gums are healthy appearing.There is no oropharyngeal erythema or exudate. Neck:  No thyromegaly, masses, tenderness noted.    Heart:  No gallop, click, rub.  Lungs: without wheezes, rhonchi,  rubs. Abdomen: Bowel sounds are normal.  Abdomen is soft and nontender  with no organomegaly, hernias, masses. GU: Deferred  Extremities:  No cyanosis, clubbing. Neurologic exam: Balance, Rhomberg, finger to nose testing could not be completed due to clinical state Skin: Warm & dry w/o tenting. No significant lesions or rash.  See clinical summary under each active problem in the Problem List with associated updated therapeutic plan

## 2020-06-22 NOTE — Assessment & Plan Note (Signed)
Duloxetine will be decreased to 60 mg and alendronate discontinued. Alendronate also problematic because of advanced age and documented GERD which is being treated.

## 2020-06-22 NOTE — Assessment & Plan Note (Addendum)
06/22/2020 clinically resolved on exam ST monitor @ SNF for dysphagia & increased aspiration risk

## 2020-06-22 NOTE — Assessment & Plan Note (Signed)
PT/OT at SNF °

## 2020-06-23 NOTE — Assessment & Plan Note (Signed)
No bleeding dyscrasias reported; continue monitor

## 2020-06-23 NOTE — Assessment & Plan Note (Signed)
Avoid nephrotoxivc drugs Address Pharmacy concerns with Alendronate & high dose Duloxetine in context of low GFR

## 2020-06-23 NOTE — Assessment & Plan Note (Signed)
Clinically stable w/o bleeding dyscrasias

## 2020-06-25 ENCOUNTER — Encounter: Payer: Self-pay | Admitting: Adult Health

## 2020-06-25 ENCOUNTER — Other Ambulatory Visit: Payer: Self-pay | Admitting: Adult Health

## 2020-06-25 ENCOUNTER — Other Ambulatory Visit (HOSPITAL_COMMUNITY)
Admission: RE | Admit: 2020-06-25 | Discharge: 2020-06-25 | Disposition: A | Discharge status: Home / Self Care | Payer: Medicare Other | Source: Skilled Nursing Facility | Attending: Adult Health | Admitting: Adult Health

## 2020-06-25 ENCOUNTER — Non-Acute Institutional Stay (SKILLED_NURSING_FACILITY): Payer: Medicare Other | Admitting: Adult Health

## 2020-06-25 DIAGNOSIS — S72001P Fracture of unspecified part of neck of right femur, subsequent encounter for closed fracture with malunion: Secondary | ICD-10-CM

## 2020-06-25 DIAGNOSIS — M419 Scoliosis, unspecified: Secondary | ICD-10-CM

## 2020-06-25 DIAGNOSIS — I132 Hypertensive heart and chronic kidney disease with heart failure and with stage 5 chronic kidney disease, or end stage renal disease: Secondary | ICD-10-CM | POA: Insufficient documentation

## 2020-06-25 LAB — CBC
HCT: 28.7 % — ABNORMAL LOW (ref 36.0–46.0)
Hemoglobin: 9.2 g/dL — ABNORMAL LOW (ref 12.0–15.0)
MCH: 29.5 pg (ref 26.0–34.0)
MCHC: 32.1 g/dL (ref 30.0–36.0)
MCV: 92 fL (ref 80.0–100.0)
Platelets: 249 10*3/uL (ref 150–400)
RBC: 3.12 MIL/uL — ABNORMAL LOW (ref 3.87–5.11)
RDW: 14.6 % (ref 11.5–15.5)
WBC: 7.7 10*3/uL (ref 4.0–10.5)
nRBC: 0 % (ref 0.0–0.2)

## 2020-06-25 LAB — COMPREHENSIVE METABOLIC PANEL
ALT: 19 U/L (ref 0–44)
AST: 21 U/L (ref 15–41)
Albumin: 2.9 g/dL — ABNORMAL LOW (ref 3.5–5.0)
Alkaline Phosphatase: 89 U/L (ref 38–126)
Anion gap: 9 (ref 5–15)
BUN: 38 mg/dL — ABNORMAL HIGH (ref 8–23)
CO2: 22 mmol/L (ref 22–32)
Calcium: 8.9 mg/dL (ref 8.9–10.3)
Chloride: 106 mmol/L (ref 98–111)
Creatinine, Ser: 1.49 mg/dL — ABNORMAL HIGH (ref 0.44–1.00)
GFR calc Af Amer: 35 mL/min — ABNORMAL LOW (ref >60)
GFR calc non Af Amer: 30 mL/min — ABNORMAL LOW (ref >60)
Glucose, Bld: 94 mg/dL (ref 70–99)
Potassium: 4.2 mmol/L (ref 3.5–5.1)
Sodium: 137 mmol/L (ref 135–145)
Total Bilirubin: 0.6 mg/dL (ref 0.3–1.2)
Total Protein: 6.1 g/dL — ABNORMAL LOW (ref 6.5–8.1)

## 2020-06-25 MED ORDER — TRAMADOL HCL 50 MG PO TABS: 50.0000 mg | ORAL_TABLET | Freq: Three times a day (TID) | ORAL | 0 refills | Status: DC | PRN

## 2020-06-25 NOTE — Progress Notes (Signed)
Location:    [place] Room Number: 156/W Place of Service:  SNF (31)   CODE STATUS: Full Code  Allergies  Allergen Reactions  . Codeine Nausea And Vomiting    Chief Complaint  Patient presents with  . Acute Visit    Pain Management    HPI:  She currently has ordered vicodin every 6 hours as needed for her right hip fracture. She has a history of scoliosis and has been taking ultram on a long term basis. She is willing to come off the vicodin but will need her ultram restarted. We have discussed the issues of addiction. She has verbalized understanding.   Past Medical History:  Diagnosis Date  . Abdominal adhesions 1994  . Allergic rhinitis   . Anemia   . Anxiety and depression   . Aortic stenosis   . Arthritis   . Atrial fibrillation ([place]) 10/2012   Associated with severe anemia and esophageal pill impaction  . Breast carcinoma (HCC)    Right mastectomy  . Cholelithiasis   . Essential hypertension   . Gastroesophageal reflux disease    Hiatal hernia  . History of blood transfusion   . Hypothyroidism   . Low back pain   . 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 Nephrolithiasis 2004   Painless hematuria  . Short gut syndrome    Bowel resection , 2004  . Upper GI bleed 2004   Multiple episodes of melena-? due to gastritis or adverse drug effect (nonsteroidals, small bowel ulceration with Fosamax); caused by Pepto-Bismol during one Emergency Department evaluation    Past Surgical History:  Procedure Laterality Date  . ABDOMINAL HYSTERECTOMY     emergency s/p delivery  . ABDOMINAL HYSTERECTOMY  1960   massive gynecologic bleeding  . BOWEL RESECTION     Resulting short gut syndrome  . CHOLECYSTECTOMY N/A 06/25/2018   Procedure: LAPAROSCOPIC CHOLECYSTECTOMY WITH INTRAOPERATIVE CHOLANGIOGRAM;  Surgeon: Armandina Gemma, MD;  Location: [facility];  Service: General;  Laterality: N/A;  . COLONOSCOPY W/  POLYPECTOMY  2005   Lipoma; diverticulosis  . COLONOSCOPY WITH ESOPHAGOGASTRODUODENOSCOPY (EGD)  11/22/2012   Rehman  . HIP ARTHROPLASTY Right 06/15/2020   Procedure: ANTERIOR ARTHROPLASTY BIPOLAR HIP (HEMIARTHROPLASTY);  Surgeon: Mcarthur Rossetti, MD;  Location: [facility];  Service: Orthopedics;  Laterality: Right;  . LAPAROSCOPIC LYSIS OF ADHESIONS  1965   s/p adhesions  . MASTECTOMY  right breast  . MASTECTOMY     Carcinoma of the breast; right  . UPPER GASTROINTESTINAL ENDOSCOPY      Social History   Socioeconomic History  . Marital status: Widowed    Spouse name: Not on file  . Number of children: Not on file  . Years of education: 29  . Highest education level: Not on file  Occupational History  . Not on file  Tobacco Use  . Smoking status: Former Smoker    Packs/day: 1.50    Years: 20.00    Pack years: 30.00    Types: Cigarettes  . Smokeless tobacco: Never Used  Vaping Use  . Vaping Use: Never used  Substance and Sexual Activity  . Alcohol use: No  . Drug use: No  . Sexual activity: Never  Other Topics Concern  . Not on file  Social History Narrative  . Not on file   Social Determinants of Health   Financial Resource Strain:   . Difficulty of Paying Living Expenses:   Food  Insecurity:   . Worried About Charity fundraiser in the Last Year:   . Arboriculturist in the Last Year:   Transportation Needs:   . Film/video editor (Medical):   Marland Kitchen Lack of Transportation (Non-Medical):   Physical Activity:   . Days of Exercise per Week:   . Minutes of Exercise per Session:   Stress:   . Feeling of Stress :   Social Connections:   . Frequency of Communication with Friends and Family:   . Frequency of Social Gatherings with Friends and Family:   . Attends Religious Services:   . Active Member of Clubs or Organizations:   . Attends Archivist Meetings:   Marland Kitchen Marital Status:   Intimate Partner Violence:   . Fear of Current or Ex-Partner:   .  Emotionally Abused:   Marland Kitchen Physically Abused:   . Sexually Abused:    Family History  Problem Relation Age of Onset  . Anuerysm Father   . Rheum arthritis Sister   . Healthy Sister   . COPD Sister   . Healthy Brother   . Cancer Other   . Colon cancer Neg Hx       VITAL SIGNS BP 140/67   Pulse 63   Temp (!) 97.4 F (36.3 C) (Oral)   Resp 16   Ht 5\' 1"  (1.549 m)   Wt 125 lb (56.7 kg)   SpO2 97%   BMI 23.62 kg/m   Outpatient Encounter Medications as of 06/25/2020  Medication Sig  . ALPRAZolam (XANAX) 1 MG tablet Take 1 tablet (1 mg total) by mouth at bedtime as needed for sleep.  . Amino Acids-Protein Hydrolys (FEEDING SUPPLEMENT, PRO-STAT SUGAR FREE 64,) LIQD Take 30 mLs by mouth 2 (two) times daily between meals.  Marland Kitchen aspirin 81 MG chewable tablet Chew 1 tablet (81 mg total) by mouth 2 (two) times daily.  . Cyanocobalamin (VITAMIN B-12 IJ) Inject as directed every 30 (thirty) days.  . DULoxetine (CYMBALTA) 30 MG capsule Take 60 mg by mouth daily.  Marland Kitchen HYDROcodone-acetaminophen (NORCO/VICODIN) 5-325 MG tablet Take 1 tablet by mouth every 6 (six) hours as needed for moderate pain (pain score 4-6).  Marland Kitchen levothyroxine (SYNTHROID, LEVOTHROID) 75 MCG tablet Take 75 mcg by mouth daily before breakfast.  . metoprolol succinate (TOPROL-XL) 25 MG 24 hr tablet Take 1 tablet (25 mg total) by mouth daily.  . Multiple Vitamins-Iron (MULTIVITAMINS WITH IRON) TABS tablet Take 1 tablet by mouth daily.  . NON FORMULARY Diet: _____ Regular, ___x___ NAS, _______Consistent Carbohydrate, _______NPO _____Other  . pantoprazole (PROTONIX) 40 MG tablet Take 40 mg by mouth 2 (two) times daily.  . primidone (MYSOLINE) 50 MG tablet Take 1 tablet (50 mg total) by mouth at bedtime.  . [DISCONTINUED] alendronate (FOSAMAX) 70 MG tablet Take 70 mg by mouth once a week. Take with a full glass of water on an empty stomach.  . [DISCONTINUED] DULoxetine (CYMBALTA) 30 MG capsule Take 3 capsules (90 mg total) by mouth  every morning.  . [DISCONTINUED] Pediatric Multivitamins-Iron Vicki Mallet W/IRON PO) Take by mouth daily.   No facility-administered encounter medications on file as of 06/25/2020.     SIGNIFICANT DIAGNOSTIC EXAMS  PREVIOUS   06-14-20: chest x-ray: Focal opacity in the right infrahilar lung silhouetting portion of the right heart border, could reflect volume loss or consolidation in the right middle lobe and possibly right lower lobe.  06-14-20: right hip x-ray: Moderately displaced proximal right femoral neck fracture.  06-14-20; right elbow x-ray: There is no evidence of fracture, dislocation, or joint effusion. There is no evidence of arthropathy or other focal bone abnormality. Soft tissues are unremarkable.  06-14-20: right shoulder: No acute fracture or dislocation is identified. Degenerative changes are again noted at the greater tuberosity with subchondral cyst formation. Surgical clips are present in the axilla.  06-14-20: ct of head:  1. Enlargement of the scalp muscles overlying the posterior frontal and temporal bones on the right. Question hematoma in these areas. Clinical assessment advised in this regard. Underlying bone appears intact. 2. Age related volume loss with patchy periventricular small vessel disease. No acute infarct. No mass or hemorrhage. 3.  There are foci of arterial vascular calcification. 4.  Mucosal thickening noted in several ethmoid air cells.  06-15-20: chest x-ray: There may be trace pleural effusions. No acute appearing airspace opacity  NO NEW EXAMS.   LABS REVIEWED TODAY  06-14-20: wbc 13.4; hgb 10.2; hct 31.8; mcv 92.7 plt 163; glucose 129; bun 33; creat 1.62; k+ 3.6; na++ 136;ca 9.7 tsh 1.312 06-16-20: wbc 8.0; hgb 6.4; hct 19.8 mcv 92.1 plt 86; glucose 117; bun 27; creat 1.83; k+ 4.7; na++ 136; ca 7.3 06-18-20: wbc 7.3; hgb 7.9; hct 24.6; mcv 93.2 plt 108; glucose 92; bun 30; creat 1.65; k+ 3.9; na++ 135; ca 7.5 liver normal albumin 2.4 06-19-20: hgb 10.1;  hct 30.8;   TODAY  06-25-20: wbc 7.7; hgb 9.2; hct 28.7; mcv 92.0 plt 249; glucose 94; bun 38; creat 1.49; k+ 4.2; na++ 137; ca 8.9 liver normal albumin 2.9   Review of Systems  Constitutional: Negative for malaise/fatigue.  Respiratory: Negative for cough and shortness of breath.   Cardiovascular: Negative for chest pain, palpitations and leg swelling.  Gastrointestinal: Negative for abdominal pain, constipation and heartburn.  Musculoskeletal: Negative for back pain, joint pain and myalgias.  Skin: Negative.   Neurological: Negative for dizziness.  Psychiatric/Behavioral: The patient is not nervous/anxious.     Physical Exam Constitutional:      General: She is not in acute distress.    Appearance: She is well-developed. She is not diaphoretic.  Neck:     Thyroid: No thyromegaly.  Cardiovascular:     Rate and Rhythm: Normal rate and regular rhythm.     Pulses: Normal pulses.     Heart sounds: Murmur heard.   Pulmonary:     Effort: Pulmonary effort is normal. No respiratory distress.     Breath sounds: Normal breath sounds.  Abdominal:     General: Bowel sounds are normal. There is no distension.     Palpations: Abdomen is soft.     Tenderness: There is no abdominal tenderness.  Musculoskeletal:     Cervical back: Neck supple.     Right lower leg: No edema.     Left lower leg: No edema.     Comments: Able to move all extremities Is status post right hip hemiarthroplasty 06-15-20   Lymphadenopathy:     Cervical: No cervical adenopathy.  Skin:    General: Skin is warm and dry.  Neurological:     Mental Status: She is alert and oriented to person, place, and time.  Psychiatric:        Mood and Affect: Mood normal.       ASSESSMENT/ PLAN:  TODAY  1.  Closed right hip fracture with malunion subsequent encounter 2. Scoliosis unspecified scoliosis type unspecified spinal region  Will stop vicodin Will begin ultram 50 mg every 6 hours  as needed Will monitor her  status.   MD is aware of resident's narcotic use and is in agreement with current plan of care. We will attempt to wean resident as appropriate.  Ok Edwards NP [facility]  Contact [phone] Monday through Friday 8am- 5pm  After hours call [phone]

## 2020-06-26 ENCOUNTER — Other Ambulatory Visit: Payer: Self-pay | Admitting: *Deleted

## 2020-06-26 DIAGNOSIS — M419 Scoliosis, unspecified: Secondary | ICD-10-CM | POA: Insufficient documentation

## 2020-06-26 NOTE — Patient Outreach (Signed)
Screened for potential [facility] Care Management needs as a benefit of  [employer].  Jane [facility] is currently receiving skilled therapy at [facility].  Writer attended telephonic interdisciplinary team meeting to assess for disposition needs and transition plan for resident.   Facility reports member is from home alone. She was independent prior. She has 20 steps to her bedroom. Facility states member is open to bedroom being moved downstairs. She is participating well with therapy. Facility states member's niece is been primary contact.  Will continue to follow and plan outreach as appropriate to discuss [place] Management services.    Marthenia Rolling, MSN-Ed, RN,BSN [place] Acute Care Coordinator [phone] [facility]) [phone]  (Toll free office)

## 2020-06-27 ENCOUNTER — Encounter: Payer: Self-pay | Admitting: Adult Health

## 2020-06-27 ENCOUNTER — Non-Acute Institutional Stay (SKILLED_NURSING_FACILITY): Payer: Medicare Other | Admitting: Adult Health

## 2020-06-27 ENCOUNTER — Other Ambulatory Visit: Payer: Self-pay | Admitting: Adult Health

## 2020-06-27 DIAGNOSIS — I4891 Unspecified atrial fibrillation: Secondary | ICD-10-CM | POA: Diagnosis not present

## 2020-06-27 DIAGNOSIS — S72001P Fracture of unspecified part of neck of right femur, subsequent encounter for closed fracture with malunion: Secondary | ICD-10-CM | POA: Diagnosis not present

## 2020-06-27 DIAGNOSIS — F339 Major depressive disorder, recurrent, unspecified: Secondary | ICD-10-CM | POA: Diagnosis not present

## 2020-06-27 DIAGNOSIS — N1832 Chronic kidney disease, stage 3b: Secondary | ICD-10-CM | POA: Diagnosis not present

## 2020-06-27 MED ORDER — TRAMADOL HCL 50 MG PO TABS: 50.0000 mg | ORAL_TABLET | Freq: Four times a day (QID) | ORAL | 0 refills | Status: DC

## 2020-06-27 MED ORDER — ALPRAZOLAM 1 MG PO TABS: 1.0000 mg | ORAL_TABLET | Freq: Every day | ORAL | 0 refills | Status: DC

## 2020-06-27 NOTE — Progress Notes (Signed)
Location:    [facility] [place] Room Number: 133/P Place of Service:  SNF (31)   CODE STATUS: Full Code  Allergies  Allergen Reactions  . Codeine Nausea And Vomiting    Chief Complaint  Patient presents with  . Short Term Rehab       Closed fracture of right hip with malunion subsequent encounter:  Stage 3b chronic kidney disease:  Paroxysmal atrial fibrillation:   Major depression recurrent chronic:  Weekly follow up for the first 30 days post hospitalization.     HPI:  She is a 86 year old short term rehab patient being seen for the management of her chronic illnesses: Closed fracture of right hip with malunion subsequent encounter:   Stage 3b chronic kidney disease:  Paroxysmal atrial fibrillation:   Major depression recurrent chronic: she has chronic back pain due to her scoliosis. She takes ultram and tylenol every 6 hours on a routine basis at home. She is also having increased anxiety especially at night and would like for her xanax to be scheduled. She denies any problems with constipation or heart burn. She continues to participate in therapy.   Past Medical History:  Diagnosis Date  . Abdominal adhesions 1994  . Allergic rhinitis   . Anemia   . Anxiety and depression   . Aortic stenosis   . Arthritis   . Atrial fibrillation ([place]) 10/2012   Associated with severe anemia and esophageal pill impaction  . Breast carcinoma (HCC)    Right mastectomy  . Cholelithiasis   . Essential hypertension   . Gastroesophageal reflux disease    Hiatal hernia  . History of blood transfusion   . Hypothyroidism   . Low back pain   . 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 Nephrolithiasis 2004   Painless hematuria  . Short gut syndrome    Bowel resection , 2004  . Upper GI bleed 2004   Multiple episodes of melena-? due to gastritis or adverse drug effect (nonsteroidals, small bowel ulceration with Fosamax); caused by  Pepto-Bismol during one Emergency Department evaluation    Past Surgical History:  Procedure Laterality Date  . ABDOMINAL HYSTERECTOMY     emergency s/p delivery  . ABDOMINAL HYSTERECTOMY  1960   massive gynecologic bleeding  . BOWEL RESECTION     Resulting short gut syndrome  . CHOLECYSTECTOMY N/A 06/25/2018   Procedure: LAPAROSCOPIC CHOLECYSTECTOMY WITH INTRAOPERATIVE CHOLANGIOGRAM;  Surgeon: Armandina Gemma, MD;  Location: [facility];  Service: General;  Laterality: N/A;  . COLONOSCOPY W/ POLYPECTOMY  2005   Lipoma; diverticulosis  . COLONOSCOPY WITH ESOPHAGOGASTRODUODENOSCOPY (EGD)  11/22/2012   Rehman  . HIP ARTHROPLASTY Right 06/15/2020   Procedure: ANTERIOR ARTHROPLASTY BIPOLAR HIP (HEMIARTHROPLASTY);  Surgeon: Mcarthur Rossetti, MD;  Location: [facility];  Service: Orthopedics;  Laterality: Right;  . LAPAROSCOPIC LYSIS OF ADHESIONS  1965   s/p adhesions  . MASTECTOMY  right breast  . MASTECTOMY     Carcinoma of the breast; right  . UPPER GASTROINTESTINAL ENDOSCOPY      Social History   Socioeconomic History  . Marital status: Widowed    Spouse name: Not on file  . Number of children: Not on file  . Years of education: 18  . Highest education level: Not on file  Occupational History  . Not on file  Tobacco Use  . Smoking status: Former Smoker    Packs/day: 1.50    Years: 20.00  Pack years: 30.00    Types: Cigarettes  . Smokeless tobacco: Never Used  Vaping Use  . Vaping Use: Never used  Substance and Sexual Activity  . Alcohol use: No  . Drug use: No  . Sexual activity: Never  Other Topics Concern  . Not on file  Social History Narrative  . Not on file   Social Determinants of Health   Financial Resource Strain:   . Difficulty of Paying Living Expenses:   Food Insecurity:   . Worried About Charity fundraiser in the Last Year:   . Arboriculturist in the Last Year:   Transportation Needs:   . Film/video editor (Medical):   Marland Kitchen Lack of Transportation  (Non-Medical):   Physical Activity:   . Days of Exercise per Week:   . Minutes of Exercise per Session:   Stress:   . Feeling of Stress :   Social Connections:   . Frequency of Communication with Friends and Family:   . Frequency of Social Gatherings with Friends and Family:   . Attends Religious Services:   . Active Member of Clubs or Organizations:   . Attends Archivist Meetings:   Marland Kitchen Marital Status:   Intimate Partner Violence:   . Fear of Current or Ex-Partner:   . Emotionally Abused:   Marland Kitchen Physically Abused:   . Sexually Abused:    Family History  Problem Relation Age of Onset  . Anuerysm Father   . Rheum arthritis Sister   . Healthy Sister   . COPD Sister   . Healthy Brother   . Cancer Other   . Colon cancer Neg Hx       VITAL SIGNS BP 140/67   Pulse 63   Temp (!) 97.1 F (36.2 C) (Oral)   Resp 20   Ht 5\' 1"  (1.549 m)   Wt 125 lb (56.7 kg)   SpO2 99%   BMI 23.62 kg/m   Outpatient Encounter Medications as of 06/27/2020  Medication Sig  . acetaminophen (TYLENOL) 325 MG tablet Take 650 mg by mouth every 8 (eight) hours as needed.  . ALPRAZolam (XANAX) 1 MG tablet Take 1 tablet (1 mg total) by mouth at bedtime.  . Amino Acids-Protein Hydrolys (FEEDING SUPPLEMENT, PRO-STAT SUGAR FREE 64,) LIQD Take 30 mLs by mouth 2 (two) times daily between meals.  Marland Kitchen aspirin 81 MG chewable tablet Chew 1 tablet (81 mg total) by mouth 2 (two) times daily.  . Cyanocobalamin (VITAMIN B-12 IJ) Inject as directed every 30 (thirty) days.  . DULoxetine (CYMBALTA) 30 MG capsule Take 60 mg by mouth daily.  Marland Kitchen levothyroxine (SYNTHROID, LEVOTHROID) 75 MCG tablet Take 75 mcg by mouth daily before breakfast.  . metoprolol succinate (TOPROL-XL) 25 MG 24 hr tablet Take 1 tablet (25 mg total) by mouth daily.  . Multiple Vitamins-Iron (MULTIVITAMINS WITH IRON) TABS tablet Take 1 tablet by mouth daily.  . NON FORMULARY Diet: _____ Regular, ___x___ NAS, _______Consistent  Carbohydrate, _______NPO _____Other  . pantoprazole (PROTONIX) 40 MG tablet Take 40 mg by mouth 2 (two) times daily.  . primidone (MYSOLINE) 50 MG tablet Take 1 tablet (50 mg total) by mouth at bedtime.  . traMADol (ULTRAM) 50 MG tablet Take 1 tablet (50 mg total) by mouth every 6 (six) hours.  . [DISCONTINUED] ALPRAZolam (XANAX) 1 MG tablet Take 1 tablet (1 mg total) by mouth at bedtime as needed for sleep.  . [DISCONTINUED] traMADol (ULTRAM) 50 MG tablet Take 1 tablet (50  mg total) by mouth every 8 (eight) hours as needed.   No facility-administered encounter medications on file as of 06/27/2020.     SIGNIFICANT DIAGNOSTIC EXAMS   PREVIOUS   06-14-20: chest x-ray: Focal opacity in the right infrahilar lung silhouetting portion of the right heart border, could reflect volume loss or consolidation in the right middle lobe and possibly right lower lobe.  06-14-20: right hip x-ray: Moderately displaced proximal right femoral neck fracture.   06-14-20; right elbow x-ray: There is no evidence of fracture, dislocation, or joint effusion. There is no evidence of arthropathy or other focal bone abnormality. Soft tissues are unremarkable.  06-14-20: right shoulder: No acute fracture or dislocation is identified. Degenerative changes are again noted at the greater tuberosity with subchondral cyst formation. Surgical clips are present in the axilla.  06-14-20: ct of head:  1. Enlargement of the scalp muscles overlying the posterior frontal and temporal bones on the right. Question hematoma in these areas. Clinical assessment advised in this regard. Underlying bone appears intact. 2. Age related volume loss with patchy periventricular small vessel disease. No acute infarct. No mass or hemorrhage. 3.  There are foci of arterial vascular calcification. 4.  Mucosal thickening noted in several ethmoid air cells.  06-15-20: chest x-ray: There may be trace pleural effusions. No acute appearing airspace  opacity  NO NEW EXAMS.   LABS REVIEWED TODAY  06-14-20: wbc 13.4; hgb 10.2; hct 31.8; mcv 92.7 plt 163; glucose 129; bun 33; creat 1.62; k+ 3.6; na++ 136;ca 9.7 tsh 1.312 06-16-20: wbc 8.0; hgb 6.4; hct 19.8 mcv 92.1 plt 86; glucose 117; bun 27; creat 1.83; k+ 4.7; na++ 136; ca 7.3 06-18-20: wbc 7.3; hgb 7.9; hct 24.6; mcv 93.2 plt 108; glucose 92; bun 30; creat 1.65; k+ 3.9; na++ 135; ca 7.5 liver normal albumin 2.4 06-19-20: hgb 10.1; hct 30.8;  06-25-20: wbc 7.7; hgb 9.2; hct 28.7; mcv 92.0 plt 249; glucose 94; bun 38; creat 1.49; k+ 4.2; na++ 137; ca 8.9 liver normal albumin 2.9  NO NEW LABS.    Review of Systems  Constitutional: Negative for malaise/fatigue.  Respiratory: Negative for cough and shortness of breath.   Cardiovascular: Negative for chest pain, palpitations and leg swelling.  Gastrointestinal: Negative for abdominal pain, constipation and heartburn.  Musculoskeletal: Positive for back pain. Negative for joint pain and myalgias.       Has chronic back pain.   Skin: Negative.   Neurological: Negative for dizziness.  Psychiatric/Behavioral: The patient is nervous/anxious and has insomnia.     Physical Exam Constitutional:      General: She is not in acute distress.    Appearance: She is well-developed. She is not diaphoretic.  Neck:     Thyroid: No thyromegaly.  Cardiovascular:     Rate and Rhythm: Normal rate and regular rhythm.     Pulses: Normal pulses.     Heart sounds: Murmur heard.   Pulmonary:     Effort: Pulmonary effort is normal. No respiratory distress.     Breath sounds: Normal breath sounds.  Abdominal:     General: Bowel sounds are normal. There is no distension.     Palpations: Abdomen is soft.     Tenderness: There is no abdominal tenderness.  Musculoskeletal:     Cervical back: Neck supple.     Right lower leg: No edema.     Left lower leg: No edema.     Comments: Able to move all extremities Is status post right hip  hemiarthroplasty 06-15-20    Scoliosis   Lymphadenopathy:     Cervical: No cervical adenopathy.  Skin:    General: Skin is warm and dry.  Neurological:     Mental Status: She is alert and oriented to person, place, and time.  Psychiatric:        Mood and Affect: Mood normal.     ASSESSMENT/ PLAN:  TODAY  1. Closed fracture of right hip with malunion subsequent encounter: is stable will continue therapy as directed and will follow up with orthopedics; will continue asa 81 mg day. Will begin ultram 50 mg every 6 hours routine; tylenol cr 650 mg every 6 hours will monitor    2. Stage 3b chronic kidney disease: is stable bun 38; creat 1.49  3. Paroxysmal atrial fibrillation: heart rate is stable will continue asa 81 mg twice daily toprol xl 25 mg daily for rate control.   4. Major depression recurrent chronic: without change in status; will change to xanax 1 mg nightly routinely. Will continue cymbalta 90 mg daily   PREVIOUS  5. Gastroesophageal reflux disease without esophagitis: is stable will continue protonix 40 mg twice daily   6. Chronic constipation: is stable will continue to monitor   7. Hypothyroidism unspecified type: is stable tsh 1.312 will continue synthroid 75 mcg daily   8. Postoperative anemia due to acute blood loss/ iron deficiency unspecified iron deficiency type: is stable hgb 10.1 will monitor   9. Carcinoma of right female breast unspecified estrogen receptor status unspecified site of breast: is status post right mastectomy   10. Post menopausal osteoporosis: is without change in status: will continue fosamax 70 mg weekly   11. Essential tremor: is stable will continue primidone 50 mg nightly   12. Vitamin B 12 deficiency: is stable will continue monthly injections  13. Essential hypertension: is stable will continue toprol xl 25 mg daily   14. Protein calorie malnutrition severe: albumin 2.4 will continue prostat twice daily     MD is aware of resident's narcotic use and is  in agreement with current plan of care. We will attempt to wean resident as appropriate.  Ok Edwards NP [facility] Adult [facility]  Contact [phone] Monday through Friday 8am- 5pm  After hours call [phone]

## 2020-07-02 ENCOUNTER — Other Ambulatory Visit: Payer: Self-pay | Admitting: *Deleted

## 2020-07-02 NOTE — Patient Outreach (Signed)
[facility] Post-Acute Care Coordinator follow up  Mrs. Mcwhirter remains in [facility] SNF.   Update received from [place].  Transition plan is to return home. Her niece Broadus John is primary contact.   Member has a medical history of CHF. Will plan outreach to discuss [place] Management services as appropriate with niece [name].   Will continue to collaborate with facility while member resides in SNF.   Marthenia Rolling, MSN-Ed, RN,BSN [place] Acute Care Coordinator [phone] [facility]) [phone]  (Toll free office)

## 2020-07-03 ENCOUNTER — Ambulatory Visit (INDEPENDENT_AMBULATORY_CARE_PROVIDER_SITE_OTHER): Payer: Medicare Other | Admitting: Orthopaedic Surgery

## 2020-07-03 ENCOUNTER — Encounter: Payer: Self-pay | Admitting: Orthopaedic Surgery

## 2020-07-03 VITALS — Ht 61.0 in | Wt 125.0 lb

## 2020-07-03 DIAGNOSIS — Z96641 Presence of right artificial hip joint: Secondary | ICD-10-CM

## 2020-07-03 NOTE — Progress Notes (Signed)
HPI: Jane Andrews returns today follow-up status post right total hip arthroplasty 06/15/2020 secondary to right hip displaced femoral neck fracture.  Patient states she is overall doing well.  She has some soreness in the hip mostly whenever she begins to ambulate but walks out.  She is using a rolling walker to ambulate.  She worked well with physical therapy and is currently in a skilled facility.  She denies any fevers chills shortness of breath chest pain.  She is on aspirin 81 mg twice daily she is on no aspirin prior to surgery.   Physical exam: Right hip surgical incisions well approximated staples no signs of infection.  Right calf supple nontender.  Dorsiflexion plantarflexion right ankle intact.  Positive seroma 40 cc of aspirate was obtained patient tolerated well.  Impression: Right total hip arthroplasty secondary to hip fracture 06/15/2020  Plan: Staples removed Steri-Strips applied.  Patient will work on scar tissue mobilization.  Follow-up with [name] in 1 month sooner if there is any questions concerns.  She will take aspirin 81 mg once daily for another week and then discontinue as she was on no aspirin prior to surgery.

## 2020-07-09 ENCOUNTER — Other Ambulatory Visit: Payer: Self-pay | Admitting: Adult Health

## 2020-07-09 ENCOUNTER — Encounter: Payer: Self-pay | Admitting: Adult Health

## 2020-07-09 ENCOUNTER — Non-Acute Institutional Stay (SKILLED_NURSING_FACILITY): Payer: Medicare Other | Admitting: Adult Health

## 2020-07-09 DIAGNOSIS — D696 Thrombocytopenia, unspecified: Secondary | ICD-10-CM

## 2020-07-09 DIAGNOSIS — N1832 Chronic kidney disease, stage 3b: Secondary | ICD-10-CM | POA: Diagnosis not present

## 2020-07-09 DIAGNOSIS — S72001P Fracture of unspecified part of neck of right femur, subsequent encounter for closed fracture with malunion: Secondary | ICD-10-CM

## 2020-07-09 DIAGNOSIS — M419 Scoliosis, unspecified: Secondary | ICD-10-CM

## 2020-07-09 MED ORDER — ALPRAZOLAM 1 MG PO TABS: 1.0000 mg | ORAL_TABLET | Freq: Every day | ORAL | 0 refills | Status: DC

## 2020-07-09 MED ORDER — LEVOTHYROXINE SODIUM 75 MCG PO TABS: 75.0000 ug | ORAL_TABLET | Freq: Every day | ORAL | 0 refills | Status: DC

## 2020-07-09 MED ORDER — PANTOPRAZOLE SODIUM 40 MG PO TBEC: 40.0000 mg | DELAYED_RELEASE_TABLET | Freq: Two times a day (BID) | ORAL | 0 refills | Status: DC

## 2020-07-09 MED ORDER — TRAMADOL HCL 50 MG PO TABS: 50.0000 mg | ORAL_TABLET | Freq: Four times a day (QID) | ORAL | 0 refills | Status: DC

## 2020-07-09 MED ORDER — METOPROLOL SUCCINATE ER 25 MG PO TB24: 25.0000 mg | ORAL_TABLET | Freq: Every day | ORAL | 0 refills | Status: DC

## 2020-07-09 MED ORDER — DULOXETINE HCL 30 MG PO CPEP: 60.0000 mg | ORAL_CAPSULE | Freq: Every day | ORAL | 0 refills | Status: DC

## 2020-07-09 MED ORDER — PRIMIDONE 50 MG PO TABS: 50.0000 mg | ORAL_TABLET | Freq: Every day | ORAL | 0 refills | Status: DC

## 2020-07-09 NOTE — Progress Notes (Signed)
Location:    [place] Room Number: 133/P Place of Service:  SNF (31)    CODE STATUS: Full Code  Allergies  Allergen Reactions  . Codeine Nausea And Vomiting    Chief Complaint  Patient presents with  . Discharge Note    Discharge Visit    HPI:  She is being discharged to home will continue pt/ot on an outpatient basis. She will not need any dme. She will need her prescriptions written and will need to follow up with her medical provider. She had been hospitalized for a right hip fracture. She has participated in pt/ot. She is now ready to complete her therapy on an outpatient basis.    Past Medical History:  Diagnosis Date  . Abdominal adhesions 1994  . Allergic rhinitis   . Anemia   . Anxiety and depression   . Aortic stenosis   . Arthritis   . Atrial fibrillation ([place]) 10/2012   Associated with severe anemia and esophageal pill impaction  . Breast carcinoma ([facility])    Right mastectomy  . Cholelithiasis   . Essential hypertension   . Gastroesophageal reflux disease    Hiatal hernia  . History of blood transfusion   . Hypothyroidism   . Low back pain   . 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 Nephrolithiasis 2004   Painless hematuria  . Short gut syndrome    Bowel resection , 2004  . Upper GI bleed 2004   Multiple episodes of melena-? due to gastritis or adverse drug effect (nonsteroidals, small bowel ulceration with Fosamax); caused by Pepto-Bismol during one Emergency Department evaluation    Past Surgical History:  Procedure Laterality Date  . ABDOMINAL HYSTERECTOMY     emergency s/p delivery  . ABDOMINAL HYSTERECTOMY  1960   massive gynecologic bleeding  . BOWEL RESECTION     Resulting short gut syndrome  . CHOLECYSTECTOMY N/A 06/25/2018   Procedure: LAPAROSCOPIC CHOLECYSTECTOMY WITH INTRAOPERATIVE CHOLANGIOGRAM;  Surgeon: Armandina Gemma, MD;  Location: [facility];  Service: General;   Laterality: N/A;  . COLONOSCOPY W/ POLYPECTOMY  2005   Lipoma; diverticulosis  . COLONOSCOPY WITH ESOPHAGOGASTRODUODENOSCOPY (EGD)  11/22/2012   [facility]  . HIP ARTHROPLASTY Right 06/15/2020   Procedure: ANTERIOR ARTHROPLASTY BIPOLAR HIP (HEMIARTHROPLASTY);  Surgeon: Mcarthur Rossetti, MD;  Location: [facility];  Service: Orthopedics;  Laterality: Right;  . LAPAROSCOPIC LYSIS OF ADHESIONS  1965   s/p adhesions  . MASTECTOMY  right breast  . MASTECTOMY     Carcinoma of the breast; right  . UPPER GASTROINTESTINAL ENDOSCOPY      Social History   Socioeconomic History  . Marital status: Widowed    Spouse name: Not on file  . Number of children: Not on file  . Years of education: 43  . Highest education level: Not on file  Occupational History  . Not on file  Tobacco Use  . Smoking status: Former Smoker    Packs/day: 1.50    Years: 20.00    Pack years: 30.00    Types: Cigarettes  . Smokeless tobacco: Never Used  Vaping Use  . Vaping Use: Never used  Substance and Sexual Activity  . Alcohol use: No  . Drug use: No  . Sexual activity: Never  Other Topics Concern  . Not on file  Social History Narrative  . Not on file   Social Determinants of Health   Financial Resource Strain:   . Difficulty  of Paying Living Expenses:   Food Insecurity:   . Worried About Charity fundraiser in the Last Year:   . Arboriculturist in the Last Year:   Transportation Needs:   . Film/video editor (Medical):   Marland Kitchen Lack of Transportation (Non-Medical):   Physical Activity:   . Days of Exercise per Week:   . Minutes of Exercise per Session:   Stress:   . Feeling of Stress :   Social Connections:   . Frequency of Communication with Friends and Family:   . Frequency of Social Gatherings with Friends and Family:   . Attends Religious Services:   . Active Member of Clubs or Organizations:   . Attends Archivist Meetings:   Marland Kitchen Marital Status:   Intimate Partner Violence:   . Fear  of Current or Ex-Partner:   . Emotionally Abused:   Marland Kitchen Physically Abused:   . Sexually Abused:    Family History  Problem Relation Age of Onset  . Anuerysm Father   . Rheum arthritis Sister   . Healthy Sister   . COPD Sister   . Healthy Brother   . Cancer Other   . Colon cancer Neg Hx     VITAL SIGNS BP (!) 134/78   Pulse 62   Temp (!) 97.3 F (36.3 C) (Oral)   Resp 16   Ht 5\' 1"  (1.549 m)   Wt 123 lb 6.4 oz (56 kg)   SpO2 99%   BMI 23.32 kg/m   Patient's Medications  New Prescriptions   No medications on file  Previous Medications   ACETAMINOPHEN (TYLENOL) 650 MG CR TABLET    Take 650 mg by mouth every 6 (six) hours.   ALPRAZOLAM (XANAX) 1 MG TABLET    Take 1 tablet (1 mg total) by mouth at bedtime.   AMINO ACIDS-PROTEIN HYDROLYS (FEEDING SUPPLEMENT, PRO-STAT SUGAR FREE 64,) LIQD    Take 30 mLs by mouth 2 (two) times daily between meals.   ASPIRIN EC 81 MG TABLET    Take 81 mg by mouth daily. Give 1 tablet daily x 30 days then D/C   CYANOCOBALAMIN (VITAMIN B-12 IJ)    Inject as directed every 30 (thirty) days.   DULOXETINE (CYMBALTA) 30 MG CAPSULE    Take 60 mg by mouth daily.   LEVOTHYROXINE (SYNTHROID, LEVOTHROID) 75 MCG TABLET    Take 75 mcg by mouth daily before breakfast.   METOPROLOL SUCCINATE (TOPROL-XL) 25 MG 24 HR TABLET    Take 1 tablet (25 mg total) by mouth daily.   MULTIPLE VITAMINS-IRON (MULTIVITAMINS WITH IRON) TABS TABLET    Take 1 tablet by mouth daily.   NON FORMULARY    Diet: _____ Regular, ___x___ NAS, _______Consistent Carbohydrate, _______NPO _____Other   PANTOPRAZOLE (PROTONIX) 40 MG TABLET    Take 40 mg by mouth 2 (two) times daily.   PRIMIDONE (MYSOLINE) 50 MG TABLET    Take 1 tablet (50 mg total) by mouth at bedtime.   TRAMADOL (ULTRAM) 50 MG TABLET    Take 1 tablet (50 mg total) by mouth every 6 (six) hours.  Modified Medications   No medications on file  Discontinued Medications   ACETAMINOPHEN (TYLENOL) 325 MG TABLET    Take 650 mg by  mouth every 6 (six) hours.    ASPIRIN 81 MG CHEWABLE TABLET    Chew 1 tablet (81 mg total) by mouth 2 (two) times daily.     SIGNIFICANT DIAGNOSTIC EXAMS  PREVIOUS   06-14-20: chest x-ray: Focal opacity in the right infrahilar lung silhouetting portion of the right heart border, could reflect volume loss or consolidation in the right middle lobe and possibly right lower lobe.  06-14-20: right hip x-ray: Moderately displaced proximal right femoral neck fracture.   06-14-20; right elbow x-ray: There is no evidence of fracture, dislocation, or joint effusion. There is no evidence of arthropathy or other focal bone abnormality. Soft tissues are unremarkable.  06-14-20: right shoulder: No acute fracture or dislocation is identified. Degenerative changes are again noted at the greater tuberosity with subchondral cyst formation. Surgical clips are present in the axilla.  06-14-20: ct of head:  1. Enlargement of the scalp muscles overlying the posterior frontal and temporal bones on the right. Question hematoma in these areas. Clinical assessment advised in this regard. Underlying bone appears intact. 2. Age related volume loss with patchy periventricular small vessel disease. No acute infarct. No mass or hemorrhage. 3.  There are foci of arterial vascular calcification. 4.  Mucosal thickening noted in several ethmoid air cells.  06-15-20: chest x-ray: There may be trace pleural effusions. No acute appearing airspace opacity  NO NEW EXAMS.   LABS REVIEWED TODAY  06-14-20: wbc 13.4; hgb 10.2; hct 31.8; mcv 92.7 plt 163; glucose 129; bun 33; creat 1.62; k+ 3.6; na++ 136;ca 9.7 tsh 1.312 06-16-20: wbc 8.0; hgb 6.4; hct 19.8 mcv 92.1 plt 86; glucose 117; bun 27; creat 1.83; k+ 4.7; na++ 136; ca 7.3 06-18-20: wbc 7.3; hgb 7.9; hct 24.6; mcv 93.2 plt 108; glucose 92; bun 30; creat 1.65; k+ 3.9; na++ 135; ca 7.5 liver normal albumin 2.4 06-19-20: hgb 10.1; hct 30.8;  06-25-20: wbc 7.7; hgb 9.2; hct 28.7; mcv 92.0  plt 249; glucose 94; bun 38; creat 1.49; k+ 4.2; na++ 137; ca 8.9 liver normal albumin 2.9  NO NEW LABS.    Review of Systems  Constitutional: Negative for malaise/fatigue.  Respiratory: Negative for cough and shortness of breath.   Cardiovascular: Negative for chest pain, palpitations and leg swelling.  Gastrointestinal: Negative for abdominal pain, constipation and heartburn.  Musculoskeletal: Negative for back pain, joint pain and myalgias.  Skin: Negative.   Neurological: Negative for dizziness.  Psychiatric/Behavioral: The patient is not nervous/anxious.     Physical Exam Constitutional:      General: She is not in acute distress.    Appearance: She is well-developed. She is not diaphoretic.  Neck:     Thyroid: No thyromegaly.  Cardiovascular:     Rate and Rhythm: Normal rate and regular rhythm.     Pulses: Normal pulses.     Heart sounds: Murmur heard.   Pulmonary:     Effort: Pulmonary effort is normal. No respiratory distress.     Breath sounds: Normal breath sounds.  Abdominal:     General: Bowel sounds are normal. There is no distension.     Palpations: Abdomen is soft.     Tenderness: There is no abdominal tenderness.  Musculoskeletal:     Cervical back: Neck supple.     Right lower leg: No edema.     Left lower leg: No edema.     Comments: Able to move all extremities Is status post right hip hemiarthroplasty 06-15-20   Scoliosis    Lymphadenopathy:     Cervical: No cervical adenopathy.  Skin:    General: Skin is warm and dry.  Neurological:     Mental Status: She is alert and oriented to person, place, and  time.  Psychiatric:        Mood and Affect: Mood normal.        ASSESSMENT/ PLAN:   Patient is being discharged with the following home health services:  Will go to outpatient therapy for pt/ot to evaluate and treat as indicated for gait balance strength adl training.   Patient is being discharged with the following durable medical equipment:   None needed   Patient has been advised to f/u with their PCP in 1-2 weeks to bring them up to date on their rehab stay.  Social services at facility was responsible for arranging this appointment.  Pt was provided with a 30 day supply of prescriptions for medications and refills must be obtained from their PCP.  For controlled substances, a more limited supply may be provided adequate until PCP appointment only.  A 30 day supply of her prescription medications with #30 xanax 1 mg tabs and #60 ultram 50 mg tabs to [place] [facility]  Time spent with patient: 35 minutes: outpatient therapy dme medications.    Ok Edwards NP [facility]  Contact [phone] Monday through Friday 8am- 5pm  After hours call [phone]

## 2020-07-17 ENCOUNTER — Other Ambulatory Visit (HOSPITAL_COMMUNITY): Payer: Self-pay | Admitting: Internal Medicine

## 2020-07-17 DIAGNOSIS — Z8781 Personal history of (healed) traumatic fracture: Secondary | ICD-10-CM | POA: Diagnosis not present

## 2020-07-17 DIAGNOSIS — R55 Syncope and collapse: Secondary | ICD-10-CM | POA: Diagnosis not present

## 2020-07-17 DIAGNOSIS — Z6820 Body mass index (BMI) 20.0-20.9, adult: Secondary | ICD-10-CM | POA: Diagnosis not present

## 2020-07-17 DIAGNOSIS — E538 Deficiency of other specified B group vitamins: Secondary | ICD-10-CM | POA: Diagnosis not present

## 2020-07-17 DIAGNOSIS — M810 Age-related osteoporosis without current pathological fracture: Secondary | ICD-10-CM | POA: Diagnosis not present

## 2020-07-17 DIAGNOSIS — E2839 Other primary ovarian failure: Secondary | ICD-10-CM

## 2020-07-18 ENCOUNTER — Other Ambulatory Visit: Payer: Self-pay | Admitting: *Deleted

## 2020-07-18 NOTE — Patient Outreach (Signed)
[facility] Post- Acute Care Coordinator follow up. Member screened for potential [facility] Care Management needs as a benefit of [place] [employer].  Verified in Patient Pear[place] that member transitioned from [employer]. Telephone call made to Mrs. Tanksley [phone].  Patient identifiers confirmed.  Mrs. Hoxworth states she is doing very well. States she went to her PCP appointment on yesterday. She is now cleared to drive as well. Explained [place] Management follow up. Mrs. Hullum denies having any [facility] Care Management needs. Pleasantly declined [facility] follow up.  Will sign off. No identifiable [facility] Care Management needs at this time.   Marthenia Rolling, MSN-Ed, RN,BSN [place] Acute Care Coordinator [phone] [facility] [phone]  (Toll free office)

## 2020-07-23 ENCOUNTER — Encounter (HOSPITAL_COMMUNITY): Payer: Self-pay | Admitting: Physical Therapy

## 2020-07-23 ENCOUNTER — Ambulatory Visit (HOSPITAL_COMMUNITY): Payer: Medicare Other | Attending: Orthopedic Surgery | Admitting: Physical Therapy

## 2020-07-23 ENCOUNTER — Other Ambulatory Visit: Payer: Self-pay

## 2020-07-23 DIAGNOSIS — M25551 Pain in right hip: Secondary | ICD-10-CM | POA: Diagnosis not present

## 2020-07-23 DIAGNOSIS — R29898 Other symptoms and signs involving the musculoskeletal system: Secondary | ICD-10-CM | POA: Diagnosis not present

## 2020-07-23 DIAGNOSIS — M545 Low back pain, unspecified: Secondary | ICD-10-CM

## 2020-07-23 DIAGNOSIS — M6281 Muscle weakness (generalized): Secondary | ICD-10-CM | POA: Insufficient documentation

## 2020-07-23 DIAGNOSIS — R2689 Other abnormalities of gait and mobility: Secondary | ICD-10-CM

## 2020-07-23 NOTE — Therapy (Signed)
[place], [place], [address] Phone: [phone]   Fax:  [phone]  Physical Therapy Evaluation  Patient Details  Name: Jane Andrews MRN: [id number] Date of Birth: 12/11/1925 Referring Provider (PT): Unice Cobble MD   Encounter Date: 07/23/2020   PT End of Session - 07/23/20 1200    Visit Number 1    Number of Visits 8    Date for PT Re-Evaluation 08/20/20    Authorization Type Primary Medicare Secondary BCBS- V.L 75 no auth    Authorization - Visit Number 1    Authorization - Number of Visits 75    Progress Note Due on Visit 10    PT Start Time 1115    PT Stop Time 1152    PT Time Calculation (min) 37 min    Activity Tolerance Patient tolerated treatment well    Behavior During Therapy [facility] for tasks assessed/performed           Past Medical History:  Diagnosis Date   Abdominal adhesions 1994   Allergic rhinitis    Anemia    Anxiety and depression    Aortic stenosis    Arthritis    Atrial fibrillation ([place]) 10/2012   Associated with severe anemia and esophageal pill impaction   Breast carcinoma ([place])    Right mastectomy   Cholelithiasis    Essential hypertension    Gastroesophageal reflux disease    Hiatal hernia   History of blood transfusion    Hypothyroidism    Low back pain    Malabsorption    Short gut syndrome following small bowel resection surgery x2   Nephrolithiasis 2004   Painless hematuria   Short gut syndrome    Bowel resection , 2004   Upper GI bleed 2004   Multiple episodes of melena-? due to gastritis or adverse drug effect (nonsteroidals, small bowel ulceration with Fosamax); caused by Pepto-Bismol during one Emergency Department evaluation    Past Surgical History:  Procedure Laterality Date   ABDOMINAL HYSTERECTOMY     emergency s/p delivery   ABDOMINAL HYSTERECTOMY  1960   massive gynecologic bleeding   BOWEL RESECTION     Resulting  short gut syndrome   CHOLECYSTECTOMY N/A 06/25/2018   Procedure: LAPAROSCOPIC CHOLECYSTECTOMY WITH INTRAOPERATIVE CHOLANGIOGRAM;  Surgeon: Armandina Gemma, MD;  Location: [facility];  Service: General;  Laterality: N/A;   COLONOSCOPY W/ POLYPECTOMY  2005   Lipoma; diverticulosis   COLONOSCOPY WITH ESOPHAGOGASTRODUODENOSCOPY (EGD)  11/22/2012   [facility]   HIP ARTHROPLASTY Right 06/15/2020   Procedure: ANTERIOR ARTHROPLASTY BIPOLAR HIP (HEMIARTHROPLASTY);  Surgeon: Mcarthur Rossetti, MD;  Location: [facility];  Service: Orthopedics;  Laterality: Right;   LAPAROSCOPIC LYSIS OF ADHESIONS  1965   s/p adhesions   MASTECTOMY  right breast   MASTECTOMY     Carcinoma of the breast; right   UPPER GASTROINTESTINAL ENDOSCOPY      There were no vitals filed for this visit.    Subjective Assessment - 07/23/20 1115    Subjective Patient is a 86 y.o. female who presents to physical therapy s/p R femur fracture July 1st. She was in [place] for about a week and they did a hemiarthroplasty on 06/15/20. She was at the [facility] where she received PT. She has been able to do her chores, drives, and climbing steps. She states her back is bothering her more than her hip today. She is having trouble with transfers and some household chores. She has  not been able to clean her house yet. She has low back pain and has scoliosis. She was given tramadol which doesnt help much. Back and hip feel stiff.    Limitations House hold activities;Walking;Lifting    How long can you walk comfortably? 3 minutes    Patient Stated Goals A workout for her hip and decrease back pain    Currently in Pain? No/denies   worst: back 5/10, hip 3/10             [facility] PT Assessment - 07/23/20 0001      Assessment   Medical Diagnosis R femur Fx     Referring Provider (PT) Unice Cobble MD    Onset Date/Surgical Date 06/15/20    Next MD Visit F/u with surgeon on 07/31/20    Prior [place]      Precautions    Precautions Anterior Hip    Precaution Comments Not aware of anything      Restrictions   Weight Bearing Restrictions No      Balance Screen   Has the patient fallen in the past 6 months Yes    How many times? 1    Has the patient had a decrease in activity level because of a fear of falling?  No    Is the patient reluctant to leave their home because of a fear of falling?  No      Home Ecologist residence    Living Arrangements Alone      Prior Function   Level of Independence Independent      Cognition   Overall Cognitive Status Within Functional Limits for tasks assessed      Observation/Other Assessments   Observations Ambulates without AD    Focus on Therapeutic Outcomes (FOTO)  not completed      Sensation   Light Touch Appears Intact      ROM / Strength   AROM / PROM / Strength AROM;Strength      AROM   AROM Assessment Site Hip;Lumbar    Right/Left Hip Right;Left    Right Hip Flexion 96    Right Hip ABduction 36    Left Hip Flexion 94    Left Hip ABduction 36    Lumbar Flexion 0% limited    Lumbar Extension 50% limited     Lumbar - Right Side Bend 0% limited    Lumbar - Left Side Bend 0% limited    Lumbar - Right Rotation 0% limited    Lumbar - Left Rotation 0% limited      Strength   Strength Assessment Site Hip;Knee;Ankle    Right/Left Hip Right;Left    Right Hip Flexion 4-/5    Right Hip ABduction 3+/5    Left Hip Flexion 4-/5    Right/Left Knee Right;Left    Right Knee Flexion 5/5    Right Knee Extension 5/5    Left Knee Flexion 5/5    Left Knee Extension 5/5    Right/Left Ankle Right;Left    Right Ankle Dorsiflexion 5/5    Left Ankle Dorsiflexion 5/5      Transfers   Five time sit to stand comments  18.43 seconds    Comments weight shift off R hip, without UE use, slow, labored      Ambulation/Gait   Ambulation/Gait Yes    Ambulation/Gait Assistance 6: Modified independent (Device/Increase time)     Ambulation Distance (Feet) 252 Feet    Assistive device None  Gait Pattern Trendelenburg;Poor foot clearance - left;Poor foot clearance - right;Right flexed knee in stance;Left flexed knee in stance    Ambulation Surface Level;Indoor    Gait Comments 2MWT, back began hurting at about 1 minute, mild trendelenburg on RLE, slow, labored cadence with poor foot clearance bilaterally, unsteady with turning                      Objective measurements completed on examination: See above findings.       [facility] Adult PT Treatment/Exercise - 07/23/20 0001      Exercises   Exercises Knee/Hip      Knee/Hip Exercises: Standing   Hip Abduction Both;15 reps;1 set                  PT Education - 07/23/20 1124    Education Details Patient educated on exam findings, POC, scope of PT, initial HEP    Person(s) Educated Patient    Methods Explanation;Demonstration    Comprehension Verbalized understanding;Returned demonstration            PT Short Term Goals - 07/23/20 1234      PT SHORT TERM GOAL #1   Title Patient will be independent with HEP in order to improve functional outcomes.    Time 2    Period Weeks    Status New    Target Date 08/06/20      PT SHORT TERM GOAL #2   Title Patient will report at least 25% improvement in symptoms for improved quality of life.    Time 2    Period Weeks    Status New    Target Date 08/06/20             PT Long Term Goals - 07/23/20 1234      PT LONG TERM GOAL #1   Title Patient will report at least 75% improvement in symptoms for improved quality of life.    Time 4    Period Weeks    Status New    Target Date 08/20/20      PT LONG TERM GOAL #2   Title Patient will be able to complete 5x STS in under 11.4 seconds in order to reduce the risk of falls.    Time 4    Period Weeks    Status New    Target Date 08/20/20      PT LONG TERM GOAL #3   Title Patient will be able to ambulate at least 300 feet in 2MWT in  order to demonstrate improved gait speed for community ambulation.    Time 4    Period Weeks    Status New    Target Date 08/20/20                  Plan - 07/23/20 1202    Clinical Impression Statement Patient is a 86 y.o. female who presents to physical therapy s/p R femur fracture July 1st  with surgery 06/15/20 as well as c/o low back pain. She presents with pain limited deficits in R hip and Lumbar spine strength, ROM, endurance, postural impairments, gait, balance, and functional mobility with ADL. She is having to modify and restrict ADL as indicated by subjective information and objective measures which is affecting overall participation. Patient will benefit from skilled physical therapy in order to improve function and reduce impairment.    Personal Factors and Comorbidities Age;Social Background;Comorbidity 3+    Comorbidities chronic low back pain, falls,  lives alone, scolosis    Examination-Activity Limitations Bend;Squat;Stand;Stairs;Transfers;Locomotion Level;Lift    Examination-Participation Restrictions Cleaning;Shop;Volunteer;Yard Work    Merchant navy officer Stable/Uncomplicated    Designer, jewellery Low    Rehab Potential Fair    PT Frequency 2x / week    PT Duration 4 weeks    PT Treatment/Interventions ADLs/Self Care Home Management;Aquatic Therapy;Biofeedback;Cryotherapy;Electrical Stimulation;Iontophoresis 4mg /ml Dexamethasone;Moist Heat;Traction;Ultrasound;Contrast Bath;DME Instruction;Gait training;Stair training;Functional mobility training;Therapeutic activities;Therapeutic exercise;Balance training;Neuromuscular re-education;Patient/family education;Orthotic Fit/Training;Manual techniques;Manual lymph drainage;Compression bandaging;Scar mobilization;Passive range of motion;Dry needling;Energy conservation;Splinting;Taping;Spinal Manipulations;Joint Manipulations    PT Next Visit Plan begin core and hip strengthening, functional strengthening  with stairs, STS, add core and lumbar/postural exercises as able, possibly begin gait and balance training    PT Home Exercise Plan 07/23/20 standing hip abduction    Consulted and Agree with Plan of Care Patient           Patient will benefit from skilled therapeutic intervention in order to improve the following deficits and impairments:  Abnormal gait, Decreased activity tolerance, Decreased balance, Decreased mobility, Decreased strength, Decreased endurance, Decreased range of motion, Difficulty walking, Impaired flexibility, Pain  Visit Diagnosis: Pain in right hip  Low back pain, unspecified back pain laterality, unspecified chronicity, unspecified whether sciatica present  Muscle weakness (generalized)  Other abnormalities of gait and mobility  Other symptoms and signs involving the musculoskeletal system     Problem List Patient Active Problem List   Diagnosis Date Noted   Scoliosis 06/26/2020   Thrombocytopenia ([place]) 06/22/2020   Aspiration pneumonia ([place]) 06/22/2020   Chronic constipation 06/21/2020   Post-menopausal osteoporosis 06/21/2020   Essential tremor 06/21/2020   Vitamin B 12 deficiency 06/21/2020   Major depression, recurrent, chronic ([place]) 06/21/2020   Status post total replacement of right hip    AKI (acute kidney injury) ([place])    Postoperative anemia due to acute blood loss    Closed right hip fracture (H[facility]) 06/14/2020   IDA (iron deficiency anemia) 02/28/2020   Atrial fibrillation with RVR ([place]) 12/25/2018   GERD (gastroesophageal reflux disease) 12/25/2018   Chronic cholecystitis s/p lap cholecystectomy 06/25/2018 06/18/2018   Vagal reaction 04/12/2016   Abdominal pain 09/19/2014   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

## 2020-07-23 NOTE — Patient Instructions (Signed)
Access Code: [email or web address] URL: [email or web address] Date: 07/23/2020 Prepared by: Mitzi Hansen Juleah Paradise  Exercises Standing Hip Abduction with Counter Support - 1 x daily - 7 x weekly - 2 sets - 15 reps

## 2020-07-30 DIAGNOSIS — E782 Mixed hyperlipidemia: Secondary | ICD-10-CM | POA: Diagnosis not present

## 2020-07-30 DIAGNOSIS — R5383 Other fatigue: Secondary | ICD-10-CM | POA: Diagnosis not present

## 2020-07-30 DIAGNOSIS — D509 Iron deficiency anemia, unspecified: Secondary | ICD-10-CM | POA: Diagnosis not present

## 2020-07-30 DIAGNOSIS — E538 Deficiency of other specified B group vitamins: Secondary | ICD-10-CM | POA: Diagnosis not present

## 2020-07-30 DIAGNOSIS — R634 Abnormal weight loss: Secondary | ICD-10-CM | POA: Diagnosis not present

## 2020-07-30 DIAGNOSIS — R6881 Early satiety: Secondary | ICD-10-CM | POA: Diagnosis not present

## 2020-07-30 DIAGNOSIS — Z681 Body mass index (BMI) 19 or less, adult: Secondary | ICD-10-CM | POA: Diagnosis not present

## 2020-07-30 DIAGNOSIS — I4891 Unspecified atrial fibrillation: Secondary | ICD-10-CM | POA: Diagnosis not present

## 2020-07-30 DIAGNOSIS — R197 Diarrhea, unspecified: Secondary | ICD-10-CM | POA: Diagnosis not present

## 2020-07-30 DIAGNOSIS — E559 Vitamin D deficiency, unspecified: Secondary | ICD-10-CM | POA: Diagnosis not present

## 2020-07-31 ENCOUNTER — Other Ambulatory Visit (HOSPITAL_COMMUNITY): Payer: Self-pay | Admitting: Internal Medicine

## 2020-07-31 ENCOUNTER — Ambulatory Visit (INDEPENDENT_AMBULATORY_CARE_PROVIDER_SITE_OTHER): Payer: Medicare Other | Admitting: Orthopaedic Surgery

## 2020-07-31 ENCOUNTER — Other Ambulatory Visit: Payer: Self-pay | Admitting: Internal Medicine

## 2020-07-31 ENCOUNTER — Encounter: Payer: Self-pay | Admitting: Orthopaedic Surgery

## 2020-07-31 DIAGNOSIS — R6881 Early satiety: Secondary | ICD-10-CM

## 2020-07-31 DIAGNOSIS — Z96641 Presence of right artificial hip joint: Secondary | ICD-10-CM

## 2020-07-31 DIAGNOSIS — R109 Unspecified abdominal pain: Secondary | ICD-10-CM

## 2020-07-31 DIAGNOSIS — R634 Abnormal weight loss: Secondary | ICD-10-CM

## 2020-07-31 NOTE — Progress Notes (Signed)
HPI: This very straw returns today now 6 weeks status post right total hip secondary to a hip fracture. She overall is doing very well. She states her range of motion strengthening improving. She is using no assistive device. States her and surgical incisions well-healed. She is being treated currently for UTI. She denies any fevers chills. Denies any shortness of breath or chest pain.  Physical exam: Right hip excellent range of motion without pain. Right calf supple nontender. No pitting edema bilateral lower extremities. Dorsiflexion plantarflexion right ankle intact.  Impression: Status post right total hip arthroplasty 06/15/2020  Plan: She will continue to work on range of motion strengthening. She will follow-up with [name] in approximately 4-1/2 months for 27-month visit and at that time we will obtain an AP pelvis and lateral view of the right hip. She follow-up sooner if there is any questions concerns

## 2020-08-08 ENCOUNTER — Telehealth (HOSPITAL_COMMUNITY): Payer: Self-pay

## 2020-08-08 ENCOUNTER — Ambulatory Visit (HOSPITAL_COMMUNITY): Payer: Medicare Other

## 2020-08-08 NOTE — Telephone Encounter (Signed)
pt asked to cancel the remainder of her appts because she is still sick and the MD is trying to figure out what is wrong

## 2020-08-09 ENCOUNTER — Ambulatory Visit (HOSPITAL_COMMUNITY)
Admission: RE | Admit: 2020-08-09 | Discharge: 2020-08-09 | Disposition: A | Discharge status: Home / Self Care | Payer: Medicare Other | Source: Ambulatory Visit | Attending: Internal Medicine | Admitting: Internal Medicine

## 2020-08-09 ENCOUNTER — Other Ambulatory Visit: Payer: Self-pay

## 2020-08-09 DIAGNOSIS — E2839 Other primary ovarian failure: Secondary | ICD-10-CM | POA: Insufficient documentation

## 2020-08-09 DIAGNOSIS — R29890 Loss of height: Secondary | ICD-10-CM | POA: Diagnosis not present

## 2020-08-09 DIAGNOSIS — Z780 Asymptomatic menopausal state: Secondary | ICD-10-CM | POA: Diagnosis not present

## 2020-08-10 ENCOUNTER — Ambulatory Visit (HOSPITAL_COMMUNITY): Payer: Medicare Other

## 2020-08-14 ENCOUNTER — Encounter (HOSPITAL_COMMUNITY): Payer: Medicare Other | Admitting: Physical Therapy

## 2020-08-15 DIAGNOSIS — N281 Cyst of kidney, acquired: Secondary | ICD-10-CM | POA: Diagnosis not present

## 2020-08-15 DIAGNOSIS — Z79899 Other long term (current) drug therapy: Secondary | ICD-10-CM | POA: Diagnosis not present

## 2020-08-15 DIAGNOSIS — I129 Hypertensive chronic kidney disease with stage 1 through stage 4 chronic kidney disease, or unspecified chronic kidney disease: Secondary | ICD-10-CM | POA: Diagnosis not present

## 2020-08-15 DIAGNOSIS — N179 Acute kidney failure, unspecified: Secondary | ICD-10-CM | POA: Insufficient documentation

## 2020-08-15 DIAGNOSIS — E872 Acidosis: Secondary | ICD-10-CM | POA: Diagnosis not present

## 2020-08-15 DIAGNOSIS — N184 Chronic kidney disease, stage 4 (severe): Secondary | ICD-10-CM | POA: Diagnosis not present

## 2020-08-15 DIAGNOSIS — I5032 Chronic diastolic (congestive) heart failure: Secondary | ICD-10-CM | POA: Diagnosis not present

## 2020-08-15 DIAGNOSIS — D638 Anemia in other chronic diseases classified elsewhere: Secondary | ICD-10-CM | POA: Diagnosis not present

## 2020-08-15 DIAGNOSIS — N170 Acute kidney failure with tubular necrosis: Secondary | ICD-10-CM | POA: Diagnosis not present

## 2020-08-15 DIAGNOSIS — E559 Vitamin D deficiency, unspecified: Secondary | ICD-10-CM | POA: Diagnosis not present

## 2020-08-16 ENCOUNTER — Encounter (HOSPITAL_COMMUNITY): Payer: Medicare Other

## 2020-08-21 ENCOUNTER — Other Ambulatory Visit: Payer: Self-pay | Admitting: Cardiology

## 2020-08-21 ENCOUNTER — Other Ambulatory Visit: Payer: Self-pay | Admitting: Adult Health

## 2020-08-21 ENCOUNTER — Encounter (HOSPITAL_COMMUNITY): Payer: Medicare Other

## 2020-08-22 NOTE — Progress Notes (Addendum)
Cardiology Office Note   Date:  08/23/2020   ID:  Jane Andrews, DOB 09-Jan-1925, MRN [id number]  PCP:  Redmond School, MD  Cardiologist:  Dr. Domenic Polite    Chief Complaint  Patient presents with   Atrial Fibrillation      History of Present Illness: Jane Andrews is a 86 y.o. female who presents for PAF/syncope  Paroxysmal atrial fibrillation with CHA2DS2-VASc score of 4.  She continues on low-dose Lopressor and is not anticoagulated given history of recurrent GI bleeding.  She reports no active symptoms at this time Mild aortic stenosis by echocardiogram in January 2020, asymptomatic at this time.  Hx in July 2021 of hip fracture and PNA   Has seen nephrology for CKD-4.  Last EKG 06/14/20 in [place].   With hip fx pt does not know what happened.  She was walking and woke up by sofa.   No awareness of rapid HR. No dizziness.  In Er her troponin hs was 125.  Echo was done in ER and mild LVH, G1 DD RV normal.  trivial MR and mild aortic valve stenoisis.  Today her daughter is with her and post hospital at home developed extreme fatigue. Just could not do much, when initially she had good energy.  No awareness of rapid HR. No dizziness.  No chest pain.   Past Medical History:  Diagnosis Date   Abdominal adhesions 1994   Allergic rhinitis    Anemia    Anxiety and depression    Aortic stenosis    Arthritis    Atrial fibrillation ([place]) 10/2012   Associated with severe anemia and esophageal pill impaction   Breast carcinoma (H[facility])    Right mastectomy   Cholelithiasis    Essential hypertension    Gastroesophageal reflux disease    Hiatal hernia   History of blood transfusion    Hypothyroidism    Low back pain    Malabsorption    Short gut syndrome following small bowel resection surgery x2   Nephrolithiasis 2004   Painless hematuria   Short gut syndrome    Bowel resection , 2004   Upper GI bleed 2004   Multiple episodes of melena-? due to gastritis  or adverse drug effect (nonsteroidals, small bowel ulceration with Fosamax); caused by Pepto-Bismol during one Emergency Department evaluation    Past Surgical History:  Procedure Laterality Date   ABDOMINAL HYSTERECTOMY     emergency s/p delivery   ABDOMINAL HYSTERECTOMY  1960   massive gynecologic bleeding   BOWEL RESECTION     Resulting short gut syndrome   CHOLECYSTECTOMY N/A 06/25/2018   Procedure: LAPAROSCOPIC CHOLECYSTECTOMY WITH INTRAOPERATIVE CHOLANGIOGRAM;  Surgeon: Armandina Gemma, MD;  Location: [facility];  Service: General;  Laterality: N/A;   COLONOSCOPY W/ POLYPECTOMY  2005   Lipoma; diverticulosis   COLONOSCOPY WITH ESOPHAGOGASTRODUODENOSCOPY (EGD)  11/22/2012   [facility]   HIP ARTHROPLASTY Right 06/15/2020   Procedure: ANTERIOR ARTHROPLASTY BIPOLAR HIP (HEMIARTHROPLASTY);  Surgeon: Mcarthur Rossetti, MD;  Location: [facility];  Service: Orthopedics;  Laterality: Right;   LAPAROSCOPIC LYSIS OF ADHESIONS  1965   s/p adhesions   MASTECTOMY  right breast   MASTECTOMY     Carcinoma of the breast; right   UPPER GASTROINTESTINAL ENDOSCOPY       Current Outpatient Medications  Medication Sig Dispense Refill   acetaminophen (TYLENOL) 650 MG CR tablet Take 650 mg by mouth every 6 (six) hours.     ALPRAZolam (XANAX) 1 MG tablet Take 1 tablet (  1 mg total) by mouth at bedtime. 30 tablet 0   Cyanocobalamin (VITAMIN B-12 IJ) Inject as directed every 30 (thirty) days.     DULoxetine (CYMBALTA) 30 MG capsule Take 2 capsules (60 mg total) by mouth daily. (Patient taking differently: Take 90 mg by mouth daily. ) 60 capsule 0   ergocalciferol (VITAMIN D2) 1.25 MG (50000 UT) capsule Take by mouth.     levothyroxine (SYNTHROID) 75 MCG tablet Take 1 tablet (75 mcg total) by mouth daily before breakfast. 30 tablet 0   metoprolol succinate (TOPROL-XL) 25 MG 24 hr tablet Take 1 tablet (25 mg total) by mouth daily. 30 tablet 0   Multiple Vitamins-Iron (MULTIVITAMINS WITH IRON) TABS  tablet Take 1 tablet by mouth daily.     NON FORMULARY Diet: _____ Regular, ___x___ NAS, _______Consistent Carbohydrate, _______NPO _____Other     pantoprazole (PROTONIX) 40 MG tablet Take 1 tablet (40 mg total) by mouth 2 (two) times daily. (Patient taking differently: Take 40 mg by mouth daily. ) 60 tablet 0   primidone (MYSOLINE) 50 MG tablet Take 1 tablet (50 mg total) by mouth at bedtime. 30 tablet 0   traMADol (ULTRAM) 50 MG tablet Take 1 tablet (50 mg total) by mouth every 6 (six) hours. 60 tablet 0   No current facility-administered medications for this visit.    Allergies:   Codeine    Social History:  The patient  reports that she has quit smoking. Her smoking use included cigarettes. She has a 30.00 pack-year smoking history. She has never used smokeless tobacco. She reports that she does not drink alcohol and does not use drugs.   Family History:  The patient's family history includes Anuerysm in her father; COPD in her sister; Cancer in an other family member; Healthy in her brother and sister; Rheum arthritis in her sister.    ROS:  General:no colds or fevers, + weight loss after hip fracture.  Does not know why she fell.  Skin:no rashes or ulcers HEENT:no blurred vision, no congestion CV:see HPI PUL:see HPI GI:no diarrhea constipation or melena, no indigestion GU:no hematuria, no dysuria MS:no joint pain, no claudication Neuro:no syncope, no lightheadedness Endo:no diabetes, no thyroid disease  Wt Readings from Last 3 Encounters:  08/23/20 117 lb (53.1 kg)  07/09/20 123 lb 6.4 oz (56 kg)  07/03/20 125 lb (56.7 kg)     PHYSICAL EXAM: VS:  BP 118/70    Pulse 70    Ht 5\' 1"  (1.549 m)    Wt 117 lb (53.1 kg)    SpO2 97%    BMI 22.11 kg/m  , BMI Body mass index is 22.11 kg/m. General:Pleasant affect, NAD Skin:Warm and dry, brisk capillary refill, + bruising on leg from bumping into car door. HEENT:normocephalic, sclera clear, mucus membranes  moist Neck:supple, no JVD, no bruits  Heart:S1S2 RRR with 6-5/0 systolic AS murmur, gallup, rub or click Lungs:clear without rales, rhonchi, or wheezes PTW:SFKC, non tender, + BS, do not palpate liver spleen or masses Ext:no lower ext edema, 2+ pedal pulses, 2+ radial pulses Neuro:alert and oriented, MAE, follows commands, + facial symmetry    EKG:  EKG is NOT ordered today. The ekg ordered 06/14/20 with [facility] and no ST changes reviewed    Recent Labs: 06/14/2020: Magnesium 1.9; TSH 1.312 06/25/2020: ALT 19; BUN 38; Creatinine, Ser 1.49; Hemoglobin 9.2; Platelets 249; Potassium 4.2; Sodium 137    Lipid Panel No results found for: CHOL, TRIG, HDL, CHOLHDL, VLDL, LDLCALC, LDLDIRECT  Other studies Reviewed: Additional studies/ records that were reviewed today include: .  Echo 06/15/20 IMPRESSIONS    1. Left ventricular ejection fraction, by estimation, is 60 to 65%. The  left ventricle has normal function. The left ventricle has no regional  wall motion abnormalities. There is mild asymmetric left ventricular  hypertrophy of the basal-septal segment.  Left ventricular diastolic parameters are consistent with Grade I  diastolic dysfunction (impaired relaxation).  2. Right ventricular systolic function is normal. The right ventricular  size is not well visualized. Tricuspid regurgitation signal is inadequate  for assessing PA pressure.  3. The mitral valve is normal in structure. Trivial mitral valve  regurgitation. No evidence of mitral stenosis.  4. The aortic valve is abnormal. Aortic valve regurgitation is not  visualized. Mild aortic valve stenosis. Aortic valve mean gradient  measures 12.0 mmHg.  5. The inferior vena cava is normal in size with <50% respiratory  variability, suggesting right atrial pressure of 8 mmHg.   FINDINGS  Left Ventricle: Left ventricular ejection fraction, by estimation, is 60  to 65%. The left ventricle has normal function. The left  ventricle has no  regional wall motion abnormalities. The left ventricular internal cavity  size was normal in size. There is  mild asymmetric left ventricular hypertrophy of the basal-septal segment.  Left ventricular diastolic parameters are consistent with Grade I  diastolic dysfunction (impaired relaxation).   Right Ventricle: The right ventricular size is not well visualized. Right  vetricular wall thickness was not assessed. Right ventricular systolic  function is normal. Tricuspid regurgitation signal is inadequate for  assessing PA pressure.   Left Atrium: Left atrial size was normal in size.   Right Atrium: Right atrial size was normal in size.   Pericardium: There is no evidence of pericardial effusion.   Mitral Valve: The mitral valve is normal in structure. There is moderate  thickening of the mitral valve leaflet(s). Normal mobility of the mitral  valve leaflets. Mild mitral annular calcification. Trivial mitral valve  regurgitation. No evidence of mitral  valve stenosis.   Tricuspid Valve: The tricuspid valve is normal in structure. Tricuspid  valve regurgitation is not demonstrated. No evidence of tricuspid  stenosis.   Aortic Valve: The aortic valve is abnormal. Aortic valve regurgitation is  not visualized. Mild aortic stenosis is present. There is severe  calcifcation of the aortic valve. Aortic valve mean gradient measures 12.0  mmHg. Aortic valve peak gradient  measures 19.0 mmHg. Aortic valve area, by VTI measures 1.14 cm.   Pulmonic Valve: The pulmonic valve was grossly normal. Pulmonic valve  regurgitation is trivial. No evidence of pulmonic stenosis.   Aorta: The aortic root is normal in size and structure.   Venous: The inferior vena cava is normal in size with less than 50%  respiratory variability, suggesting right atrial pressure of 8 mmHg.   IAS/Shunts: The interatrial septum was not well visualized.     ASSESSMENT AND PLAN:  1.  PAF has  refused anticoagulation and has hx of GI bleeding. Per exam in SR.  Was [place] on EKG in July  And on tele strips I reviewed. Continues on low dose BB.  CHA2DS2-VASc score of 4.    2.  Syncope with hip fx, she has no idea what happened, usually with falls she remembers what she tripped over. Concern for rapid a fib, or conversion pause from a fib.  Will have her wear a 30 day event monitor to help evaluate.  Her AS  is mild on recent echo. CT of her head in ER did not reveal acute process  3.  Elevated hs troponin 125,  Most likely demand ischemia with acute fx.  She has had another episode of fatigue and weakness.  Will do limited echo for EF and AS.  No hx of CAD and no MI- we discussed nuc study but will begin with echo.  4.  No hx of CAD and on CTA of chest in 2018 she did have coronary arteriosclerosis.  Denies any chest pain.   5.  Some wt loss, if above tests are stable would consider orthostatic hypotension.   Follow up with Dr. Domenic Polite in 5-6 weeks    Current medicines are reviewed with the patient today.  The patient Has no concerns regarding medicines.  The following changes have been made:  See above Labs/ tests ordered today include:see above  Disposition:   FU:  see above  Signed, Cecilie Kicks, NP  08/23/2020 2:24 PM    [facility] [place], [place], [place], [place] Phone: [phone]; Fax: [phone]

## 2020-08-23 ENCOUNTER — Encounter (HOSPITAL_COMMUNITY): Payer: Medicare Other

## 2020-08-23 ENCOUNTER — Other Ambulatory Visit: Payer: Self-pay

## 2020-08-23 ENCOUNTER — Encounter: Payer: Self-pay | Admitting: Cardiology

## 2020-08-23 ENCOUNTER — Ambulatory Visit (INDEPENDENT_AMBULATORY_CARE_PROVIDER_SITE_OTHER): Payer: Medicare Other | Admitting: Cardiology

## 2020-08-23 VITALS — BP 118/70 | HR 70 | Ht 61.0 in | Wt 117.0 lb

## 2020-08-23 DIAGNOSIS — R778 Other specified abnormalities of plasma proteins: Secondary | ICD-10-CM

## 2020-08-23 DIAGNOSIS — N170 Acute kidney failure with tubular necrosis: Secondary | ICD-10-CM | POA: Diagnosis not present

## 2020-08-23 DIAGNOSIS — I5032 Chronic diastolic (congestive) heart failure: Secondary | ICD-10-CM | POA: Diagnosis not present

## 2020-08-23 DIAGNOSIS — R55 Syncope and collapse: Secondary | ICD-10-CM

## 2020-08-23 DIAGNOSIS — I480 Paroxysmal atrial fibrillation: Secondary | ICD-10-CM | POA: Diagnosis not present

## 2020-08-23 DIAGNOSIS — D638 Anemia in other chronic diseases classified elsewhere: Secondary | ICD-10-CM | POA: Diagnosis not present

## 2020-08-23 DIAGNOSIS — E559 Vitamin D deficiency, unspecified: Secondary | ICD-10-CM | POA: Diagnosis not present

## 2020-08-23 DIAGNOSIS — I350 Nonrheumatic aortic (valve) stenosis: Secondary | ICD-10-CM

## 2020-08-23 DIAGNOSIS — N281 Cyst of kidney, acquired: Secondary | ICD-10-CM | POA: Diagnosis not present

## 2020-08-23 DIAGNOSIS — I129 Hypertensive chronic kidney disease with stage 1 through stage 4 chronic kidney disease, or unspecified chronic kidney disease: Secondary | ICD-10-CM | POA: Diagnosis not present

## 2020-08-23 DIAGNOSIS — R809 Proteinuria, unspecified: Secondary | ICD-10-CM | POA: Diagnosis not present

## 2020-08-23 DIAGNOSIS — N184 Chronic kidney disease, stage 4 (severe): Secondary | ICD-10-CM | POA: Diagnosis not present

## 2020-08-23 DIAGNOSIS — Z79899 Other long term (current) drug therapy: Secondary | ICD-10-CM | POA: Diagnosis not present

## 2020-08-23 DIAGNOSIS — Y832 Surgical operation with anastomosis, bypass or graft as the cause of abnormal reaction of the patient, or of later complication, without mention of misadventure at the time of the procedure: Secondary | ICD-10-CM | POA: Diagnosis not present

## 2020-08-23 NOTE — Patient Instructions (Signed)
Medication Instructions:  Your physician recommends that you continue on your current medications as directed. Please refer to the Current Medication list given to you today.  *If you need a refill on your cardiac medications before your next appointment, please call your pharmacy*   Lab Work: NONE   If you have labs (blood work) drawn today and your tests are completely normal, you will receive your results only by:  [place] (if you have MyChart) OR  A paper copy in the mail If you have any lab test that is abnormal or we need to change your treatment, we will call you to review the results.   Testing/Procedures: Your physician has requested that you have an echocardiogram. Echocardiography is a painless test that uses sound waves to create images of your heart. It provides your doctor with information about the size and shape of your heart and how well your hearts chambers and valves are working. This procedure takes approximately one hour. There are no restrictions for this procedure.  Your physician has recommended that you wear an event monitor. Event monitors are medical devices that record the hearts electrical activity. Doctors most often [name] these monitors to diagnose arrhythmias. Arrhythmias are problems with the speed or rhythm of the heartbeat. The monitor is a small, portable device. You can wear one while you do your normal daily activities. This is usually used to diagnose what is causing palpitations/syncope (passing out).   Follow-Up: At [facility], you and your health needs are our priority.  As part of our continuing mission to provide you with exceptional heart care, we have created designated Provider Care Teams.  These Care Teams include your primary Cardiologist (physician) and Advanced Practice Providers (APPs -  Physician Assistants and Nurse Practitioners) who all work together to provide you with the care you need, when you need it.  We recommend  signing up for the patient portal called "MyChart".  Sign up information is provided on this After Visit Summary.  MyChart is used to connect with patients for Virtual Visits (Telemedicine).  Patients are able to view lab/test results, encounter notes, upcoming appointments, etc.  Non-urgent messages can be sent to your provider as well.   To learn more about what you can do with MyChart, go to [email or web address].    Your next appointment:   5-6 week(s)  The format for your next appointment:   In Person  Provider:   Rozann Lesches, MD   Other Instructions Thank you for choosing [facility]!

## 2020-08-24 ENCOUNTER — Ambulatory Visit (HOSPITAL_COMMUNITY)
Admission: RE | Admit: 2020-08-24 | Discharge: 2020-08-24 | Disposition: A | Discharge status: Home / Self Care | Payer: Medicare Other | Source: Ambulatory Visit | Attending: Internal Medicine | Admitting: Internal Medicine

## 2020-08-24 DIAGNOSIS — N83201 Unspecified ovarian cyst, right side: Secondary | ICD-10-CM | POA: Diagnosis not present

## 2020-08-24 DIAGNOSIS — R109 Unspecified abdominal pain: Secondary | ICD-10-CM | POA: Diagnosis not present

## 2020-08-24 DIAGNOSIS — R634 Abnormal weight loss: Secondary | ICD-10-CM | POA: Insufficient documentation

## 2020-08-24 DIAGNOSIS — N200 Calculus of kidney: Secondary | ICD-10-CM | POA: Diagnosis not present

## 2020-08-24 DIAGNOSIS — Z853 Personal history of malignant neoplasm of breast: Secondary | ICD-10-CM | POA: Diagnosis not present

## 2020-08-24 DIAGNOSIS — R6881 Early satiety: Secondary | ICD-10-CM | POA: Insufficient documentation

## 2020-08-27 ENCOUNTER — Ambulatory Visit (HOSPITAL_COMMUNITY): Payer: Medicare Other | Attending: Orthopedic Surgery | Admitting: Physical Therapy

## 2020-08-27 ENCOUNTER — Other Ambulatory Visit: Payer: Self-pay

## 2020-08-27 ENCOUNTER — Encounter (HOSPITAL_COMMUNITY): Payer: Self-pay | Admitting: Physical Therapy

## 2020-08-27 DIAGNOSIS — R2689 Other abnormalities of gait and mobility: Secondary | ICD-10-CM | POA: Insufficient documentation

## 2020-08-27 DIAGNOSIS — M545 Low back pain, unspecified: Secondary | ICD-10-CM

## 2020-08-27 DIAGNOSIS — M25551 Pain in right hip: Secondary | ICD-10-CM | POA: Insufficient documentation

## 2020-08-27 DIAGNOSIS — M6281 Muscle weakness (generalized): Secondary | ICD-10-CM | POA: Insufficient documentation

## 2020-08-27 DIAGNOSIS — R29898 Other symptoms and signs involving the musculoskeletal system: Secondary | ICD-10-CM | POA: Diagnosis not present

## 2020-08-27 NOTE — Patient Instructions (Signed)
Access Code: [email or web address] URL: [email or web address] Date: 08/27/2020 Prepared by: Mitzi Hansen Lessie Manigo  Exercises Standing Hip Abduction with Counter Support - 1 x daily - 7 x weekly - 2 sets - 15 reps Sit to Stand with Arms Crossed - 1 x daily - 7 x weekly - 2 sets - 10 reps Standing March with Counter Support - 1 x daily - 7 x weekly - 2 sets - 15 reps Supine Bridge - 1 x daily - 7 x weekly - 2 sets - 10 reps

## 2020-08-27 NOTE — Therapy (Signed)
[place], [place], [address] Phone: [phone]   Fax:  [phone]  Physical Therapy Treatment/ Reeval/ recert Patient Details  Name: Jane Andrews MRN: [id number] Date of Birth: June 16, 1925 Referring Provider (PT): Unice Cobble MD   Encounter Date: 08/27/2020   PT End of Session - 08/27/20 0954    Visit Number 2    Number of Visits 16    Date for PT Re-Evaluation 09/24/20    Authorization Type Primary Medicare Secondary BCBS- V.L 75 no auth    Authorization - Visit Number 2    Authorization - Number of Visits 75    Progress Note Due on Visit 10    PT Start Time [phone]    PT Stop Time 1028    PT Time Calculation (min) 42 min    Activity Tolerance Patient tolerated treatment well    Behavior During Therapy [facility] for tasks assessed/performed           Past Medical History:  Diagnosis Date  . Abdominal adhesions 1994  . Allergic rhinitis   . Anemia   . Anxiety and depression   . Aortic stenosis   . Arthritis   . Atrial fibrillation ([place]) 10/2012   Associated with severe anemia and esophageal pill impaction  . Breast carcinoma (HCC)    Right mastectomy  . Cholelithiasis   . Essential hypertension   . Gastroesophageal reflux disease    Hiatal hernia  . History of blood transfusion   . Hypothyroidism   . Low back pain   . 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 Nephrolithiasis 2004   Painless hematuria  . Short gut syndrome    Bowel resection , 2004  . Upper GI bleed 2004   Multiple episodes of melena-? due to gastritis or adverse drug effect (nonsteroidals, small bowel ulceration with Fosamax); caused by Pepto-Bismol during one Emergency Department evaluation    Past Surgical History:  Procedure Laterality Date  . ABDOMINAL HYSTERECTOMY     emergency s/p delivery  . ABDOMINAL HYSTERECTOMY  1960   massive gynecologic bleeding  . BOWEL RESECTION      Resulting short gut syndrome  . CHOLECYSTECTOMY N/A 06/25/2018   Procedure: LAPAROSCOPIC CHOLECYSTECTOMY WITH INTRAOPERATIVE CHOLANGIOGRAM;  Surgeon: Armandina Gemma, MD;  Location: [facility];  Service: General;  Laterality: N/A;  . COLONOSCOPY W/ POLYPECTOMY  2005   Lipoma; diverticulosis  . COLONOSCOPY WITH ESOPHAGOGASTRODUODENOSCOPY (EGD)  11/22/2012   [place]  . HIP ARTHROPLASTY Right 06/15/2020   Procedure: ANTERIOR ARTHROPLASTY BIPOLAR HIP (HEMIARTHROPLASTY);  Surgeon: Mcarthur Rossetti, MD;  Location: [facility];  Service: Orthopedics;  Laterality: Right;  . LAPAROSCOPIC LYSIS OF ADHESIONS  1965   s/p adhesions  . MASTECTOMY  right breast  . MASTECTOMY     Carcinoma of the breast; right  . UPPER GASTROINTESTINAL ENDOSCOPY      There were no vitals filed for this visit.   Subjective Assessment - 08/27/20 0946    Subjective Patient states she has scoliosis which hurts her all the time. Tramadol doesn't really help. Patient states she is slower because of her hip. She has to use her hands to transfer to and from sitting. She doesn't seem to have a lot of difficulty with other things. Her knee as gotten sore recently. Her legs are sore as well. Back pain is worse with standing and activity. She has to take frequent rest breaks. Leaning on the cart  helps a lot.    Limitations House hold activities;Walking;Lifting    How long can you walk comfortably? 3 minutes    Patient Stated Goals A workout for her hip and decrease back pain    Currently in Pain? Yes    Pain Score 5     Pain Location Back              [facility] PT Assessment - 08/27/20 0001      Assessment   Medical Diagnosis R femur Fx     Referring Provider (PT) Unice Cobble MD    Onset Date/Surgical Date 06/15/20    Next MD Visit Oct/Nov maybe    Prior [place]      Precautions   Precautions Anterior Hip    Precaution Comments Not aware of anything      Restrictions   Weight Bearing Restrictions No       Balance Screen   Has the patient fallen in the past 6 months Yes    How many times? 1    Has the patient had a decrease in activity level because of a fear of falling?  No    Is the patient reluctant to leave their home because of a fear of falling?  No      Home Ecologist residence    Living Arrangements Alone      Prior Function   Level of Independence Independent      Cognition   Overall Cognitive Status Within Functional Limits for tasks assessed      Observation/Other Assessments   Observations Ambulates without AD, slow, labored cadence    Focus on Therapeutic Outcomes (FOTO)  not completed      Sensation   Light Touch Appears Intact      AROM   Overall AROM Comments Pain with all lumbar AROM    Lumbar Flexion 0% limited    Lumbar Extension 50% limited     Lumbar - Right Side Bend 0% limited    Lumbar - Left Side Bend 0% limited    Lumbar - Right Rotation 0% limited    Lumbar - Left Rotation 0% limited      Strength   Right Hip Flexion 4-/5    Right Hip ABduction 3+/5    Left Hip Flexion 4-/5    Right Knee Flexion 5/5    Right Knee Extension 5/5    Left Knee Flexion 5/5    Left Knee Extension 5/5    Right Ankle Dorsiflexion 5/5    Left Ankle Dorsiflexion 5/5      Transfers   Five time sit to stand comments  19.16 seconds    Comments weight shift off R hip, without UE use, slow, labored, dynamic knee valgus      Ambulation/Gait   Ambulation/Gait Yes    Ambulation/Gait Assistance 6: Modified independent (Device/Increase time)    Ambulation Distance (Feet) 325 Feet    Assistive device None    Gait Pattern Trendelenburg;Poor foot clearance - left;Poor foot clearance - right;Right flexed knee in stance;Left flexed knee in stance    Ambulation Surface Level;Indoor    Gait Comments 2MWT, back began hurting at about 1 minute, mild trendelenburg on RLE, slow, labored cadence with poor foot clearance bilaterally, unsteady with turning                          [facility] Adult PT Treatment/Exercise - 08/27/20 0001  Knee/Hip Exercises: Standing   Hip Flexion Both;2 sets;15 reps    Hip Flexion Limitations marching    Hip Abduction Both;15 reps;2 sets      Knee/Hip Exercises: Seated   Sit to Sand 10 reps;2 sets;without UE support      Knee/Hip Exercises: Supine   Bridges 2 sets;10 reps    Other Supine Knee/Hip Exercises glute sets 5x5 second holds      Knee/Hip Exercises: Sidelying   Clams 2 x 15 bilateral                   PT Education - 08/27/20 0946    Education Details Patient educated on reassessment findings, POC, HEP, exercise mechanics    Person(s) Educated Patient    Methods Explanation;Demonstration    Comprehension Verbalized understanding;Returned demonstration            PT Short Term Goals - 08/27/20 0955      PT SHORT TERM GOAL #1   Title Patient will be independent with HEP in order to improve functional outcomes.    Time 2    Period Weeks    Status On-going    Target Date 08/06/20      PT SHORT TERM GOAL #2   Title Patient will report at least 25% improvement in symptoms for improved quality of life.    Time 2    Period Weeks    Status On-going    Target Date 08/06/20             PT Long Term Goals - 08/27/20 0955      PT LONG TERM GOAL #1   Title Patient will report at least 75% improvement in symptoms for improved quality of life.    Time 4    Period Weeks    Status On-going      PT LONG TERM GOAL #2   Title Patient will be able to complete 5x STS in under 11.4 seconds in order to reduce the risk of falls.    Time 4    Period Weeks    Status On-going      PT LONG TERM GOAL #3   Title Patient will be able to ambulate at least 300 feet in 2MWT in order to demonstrate improved gait speed for community ambulation.    Time 4    Period Weeks    Status Achieved                 Plan - 08/27/20 0946    Clinical Impression Statement Patient  has met 0/2 short term goals with continued symptoms and not able to perform HEP yet. Patient has met 1/3 long term goals with improved gait speed/distance during 2 MWT. Remaining goals not met due to continued symptoms and impaired strength. Patient continues to presents with pain limited deficits in R hip and Lumbar spine strength, ROM, endurance, postural impairments, gait, balance, and functional mobility with ADL. Patient was not able to come in because she was sick so that is why there is a lack of progress up to this point. Discussed POC with patient today and she is eager to continue therapy to work on strength, gait, balance, and back pain. Patient will continue to benefit from skilled physical therapy in order to reduce impairment and improve function.    Personal Factors and Comorbidities Age;Social Background;Comorbidity 3+    Comorbidities chronic low back pain, falls, lives alone, scolosis    Examination-Activity Limitations Bend;Squat;Stand;Stairs;Transfers;Locomotion Level;Lift  Examination-Participation Restrictions Cleaning;Shop;Volunteer;Yard Work    Stability/Clinical Decision Making Stable/Uncomplicated    Rehab Potential Fair    PT Frequency 2x / week    PT Duration 4 weeks    PT Treatment/Interventions ADLs/Self Care Home Management;Aquatic Therapy;Biofeedback;Cryotherapy;Electrical Stimulation;Iontophoresis 57m/ml Dexamethasone;Moist Heat;Traction;Ultrasound;Contrast Bath;DME Instruction;Gait training;Stair training;Functional mobility training;Therapeutic activities;Therapeutic exercise;Balance training;Neuromuscular re-education;Patient/family education;Orthotic Fit/Training;Manual techniques;Manual lymph drainage;Compression bandaging;Scar mobilization;Passive range of motion;Dry needling;Energy conservation;Splinting;Taping;Spinal Manipulations;Joint Manipulations    PT Next Visit Plan continue core and hip strengthening, functional strengthening with stairs, STS, add core and  lumbar/postural exercises as able, possibly begin gait and balance training    PT Home Exercise Plan 07/23/20 standing hip abduction 9/13 STS, marching, bridge.    Consulted and Agree with Plan of Care Patient           Patient will benefit from skilled therapeutic intervention in order to improve the following deficits and impairments:  Abnormal gait, Decreased activity tolerance, Decreased balance, Decreased mobility, Decreased strength, Decreased endurance, Decreased range of motion, Difficulty walking, Impaired flexibility, Pain  Visit Diagnosis: Pain in right hip  Low back pain, unspecified back pain laterality, unspecified chronicity, unspecified whether sciatica present  Muscle weakness (generalized)  Other abnormalities of gait and mobility  Other symptoms and signs involving the musculoskeletal system     Problem List Patient Active Problem List   Diagnosis Date Noted  . Scoliosis 06/26/2020  . Thrombocytopenia ([facility] 06/22/2020  . Aspiration pneumonia ([place] 06/22/2020  . Chronic constipation 06/21/2020  . Post-menopausal osteoporosis 06/21/2020  . Essential tremor 06/21/2020  . Vitamin B 12 deficiency 06/21/2020  . Major depression, recurrent, chronic ([facility] 06/21/2020  . Status post total replacement of right hip   . AKI (acute kidney injury) ([facility]   . Postoperative anemia due to acute blood loss   . Closed right hip fracture ([facility] 06/14/2020  . IDA (iron deficiency anemia) 02/28/2020  . Atrial fibrillation with RVR ([facility] 12/25/2018  . GERD (gastroesophageal reflux disease) 12/25/2018  . Chronic cholecystitis s/p lap cholecystectomy 06/25/2018 06/18/2018  . Vagal reaction 04/12/2016  . Abdominal pain 09/19/2014  . 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 Ecchymoses, spontaneous 05/31/2013  . Hypothyroid   . Atrial fibrillation ([facility]   . Breast carcinoma ([place]   . Malabsorption   . CKD (chronic kidney disease) stage 3, GFR 30-59 ml/min (HCC) 10/21/2012  .  Anemia, normocytic normochromic 07/06/2012  . Chronic diarrhea 02/10/2012  . Hypertension 02/10/2012    10:30 AM, 08/27/20 AMearl LatinPT, DPT [facility]rap[email or web address] [address]Phone: [phone]  Fax:  [phone] Name: Jane CREASONMRN: [id number] of Birth: 102-02-1925

## 2020-08-28 ENCOUNTER — Encounter (HOSPITAL_COMMUNITY): Payer: Medicare Other | Admitting: Physical Therapy

## 2020-08-28 ENCOUNTER — Ambulatory Visit (HOSPITAL_COMMUNITY)
Admission: RE | Admit: 2020-08-28 | Discharge: 2020-08-28 | Disposition: A | Discharge status: Home / Self Care | Payer: Medicare Other | Source: Ambulatory Visit | Attending: Cardiology | Admitting: Cardiology

## 2020-08-28 ENCOUNTER — Other Ambulatory Visit: Payer: Self-pay

## 2020-08-28 DIAGNOSIS — I350 Nonrheumatic aortic (valve) stenosis: Secondary | ICD-10-CM | POA: Diagnosis not present

## 2020-08-28 LAB — ECHOCARDIOGRAM LIMITED
AR max vel: 1.38 cm2
AV Area VTI: 1.36 cm2
AV Area mean vel: 1.49 cm2
AV Mean grad: 11.7 mmHg
AV Peak grad: 21.6 mmHg
Ao pk vel: 2.32 m/s
Calc EF: 60.4 %
S' Lateral: 2.5 cm
Single Plane A2C EF: 66.1 %
Single Plane A4C EF: 59.8 %

## 2020-08-28 NOTE — Progress Notes (Signed)
*  PRELIMINARY RESULTS* Echocardiogram 2D Echocardiogram has been performed.  Jane Andrews 08/28/2020, 2:50 PM

## 2020-08-30 ENCOUNTER — Encounter (HOSPITAL_COMMUNITY): Payer: Self-pay | Admitting: *Deleted

## 2020-08-30 ENCOUNTER — Emergency Department (HOSPITAL_COMMUNITY): Payer: Medicare Other

## 2020-08-30 ENCOUNTER — Other Ambulatory Visit: Payer: Self-pay

## 2020-08-30 ENCOUNTER — Encounter (HOSPITAL_COMMUNITY): Payer: Medicare Other

## 2020-08-30 ENCOUNTER — Encounter (HOSPITAL_COMMUNITY): Payer: Medicare Other | Admitting: Physical Therapy

## 2020-08-30 ENCOUNTER — Emergency Department (HOSPITAL_COMMUNITY)
Admission: EM | Admit: 2020-08-30 | Discharge: 2020-08-30 | Disposition: A | Discharge status: Home / Self Care | Payer: Medicare Other | Attending: Emergency Medicine | Admitting: Emergency Medicine

## 2020-08-30 DIAGNOSIS — Z96641 Presence of right artificial hip joint: Secondary | ICD-10-CM | POA: Insufficient documentation

## 2020-08-30 DIAGNOSIS — S069X9A Unspecified intracranial injury with loss of consciousness of unspecified duration, initial encounter: Secondary | ICD-10-CM | POA: Diagnosis not present

## 2020-08-30 DIAGNOSIS — Z87891 Personal history of nicotine dependence: Secondary | ICD-10-CM | POA: Diagnosis not present

## 2020-08-30 DIAGNOSIS — N183 Chronic kidney disease, stage 3 unspecified: Secondary | ICD-10-CM | POA: Insufficient documentation

## 2020-08-30 DIAGNOSIS — Z853 Personal history of malignant neoplasm of breast: Secondary | ICD-10-CM | POA: Insufficient documentation

## 2020-08-30 DIAGNOSIS — G9389 Other specified disorders of brain: Secondary | ICD-10-CM | POA: Diagnosis not present

## 2020-08-30 DIAGNOSIS — E039 Hypothyroidism, unspecified: Secondary | ICD-10-CM | POA: Diagnosis not present

## 2020-08-30 DIAGNOSIS — Z79890 Hormone replacement therapy (postmenopausal): Secondary | ICD-10-CM | POA: Insufficient documentation

## 2020-08-30 DIAGNOSIS — R55 Syncope and collapse: Secondary | ICD-10-CM | POA: Diagnosis not present

## 2020-08-30 DIAGNOSIS — I129 Hypertensive chronic kidney disease with stage 1 through stage 4 chronic kidney disease, or unspecified chronic kidney disease: Secondary | ICD-10-CM | POA: Diagnosis not present

## 2020-08-30 DIAGNOSIS — Z79899 Other long term (current) drug therapy: Secondary | ICD-10-CM | POA: Insufficient documentation

## 2020-08-30 DIAGNOSIS — J449 Chronic obstructive pulmonary disease, unspecified: Secondary | ICD-10-CM | POA: Diagnosis not present

## 2020-08-30 DIAGNOSIS — J90 Pleural effusion, not elsewhere classified: Secondary | ICD-10-CM | POA: Diagnosis not present

## 2020-08-30 LAB — CBC WITH DIFFERENTIAL/PLATELET
Abs Immature Granulocytes: 0.04 10*3/uL (ref 0.00–0.07)
Basophils Absolute: 0 10*3/uL (ref 0.0–0.1)
Basophils Relative: 1 %
Eosinophils Absolute: 0.1 10*3/uL (ref 0.0–0.5)
Eosinophils Relative: 2 %
HCT: 33 % — ABNORMAL LOW (ref 36.0–46.0)
Hemoglobin: 10.3 g/dL — ABNORMAL LOW (ref 12.0–15.0)
Immature Granulocytes: 1 %
Lymphocytes Relative: 14 %
Lymphs Abs: 1.2 10*3/uL (ref 0.7–4.0)
MCH: 29.4 pg (ref 26.0–34.0)
MCHC: 31.2 g/dL (ref 30.0–36.0)
MCV: 94.3 fL (ref 80.0–100.0)
Monocytes Absolute: 0.5 10*3/uL (ref 0.1–1.0)
Monocytes Relative: 6 %
Neutro Abs: 6.7 10*3/uL (ref 1.7–7.7)
Neutrophils Relative %: 76 %
Platelets: 194 10*3/uL (ref 150–400)
RBC: 3.5 MIL/uL — ABNORMAL LOW (ref 3.87–5.11)
RDW: 14.6 % (ref 11.5–15.5)
WBC: 8.5 10*3/uL (ref 4.0–10.5)
nRBC: 0 % (ref 0.0–0.2)

## 2020-08-30 LAB — BASIC METABOLIC PANEL
Anion gap: 11 (ref 5–15)
BUN: 26 mg/dL — ABNORMAL HIGH (ref 8–23)
CO2: 20 mmol/L — ABNORMAL LOW (ref 22–32)
Calcium: 9.8 mg/dL (ref 8.9–10.3)
Chloride: 109 mmol/L (ref 98–111)
Creatinine, Ser: 1.41 mg/dL — ABNORMAL HIGH (ref 0.44–1.00)
GFR calc Af Amer: 37 mL/min — ABNORMAL LOW (ref >60)
GFR calc non Af Amer: 32 mL/min — ABNORMAL LOW (ref >60)
Glucose, Bld: 126 mg/dL — ABNORMAL HIGH (ref 70–99)
Potassium: 4.2 mmol/L (ref 3.5–5.1)
Sodium: 140 mmol/L (ref 135–145)

## 2020-08-30 LAB — CBG MONITORING, ED: Glucose-Capillary: 117 mg/dL — ABNORMAL HIGH (ref 70–99)

## 2020-08-30 LAB — MAGNESIUM: Magnesium: 1.9 mg/dL (ref 1.7–2.4)

## 2020-08-30 NOTE — ED Provider Notes (Addendum)
[facility] EMERGENCY DEPARTMENT Provider Note   CSN: [id number] Arrival date & time: 08/30/20  1110     History Chief Complaint  Patient presents with  . Loss of Consciousness    Jane Andrews is a 86 y.o. female with a history of aortic stenosis, paroxysmal atrial fibrillation who is not on anticoagulation secondary to GI bleed, hypertension, GERD, chronic anemia and other past medical history as outlined below presenting for evaluation of syncope.  She endorses prior episodes of similar symptoms, in July she had an episode of syncope without prodrome, having fallen and fractured her hip.  With today's episode however she did have prodromal symptoms described as weakness.  She got out of bed this morning and walked to the bathroom and noted increasing weakness and lightheadedness.  She held onto the edge of the sink hoping to abort the episode, but ended up falling and hitting her head on the floor.  She states she woke immediately and was able to help herself off the floor but has felt generalized weakness since.  She denies headache, she did not have any palpitations or chest pain prior or since this morning's event.  She also denies focal weakness, no nausea or vomiting, no diaphoresis or fevers.  She also states that she has had insomnia the past several nights.  She actually called EMS around 4 AM today secondary to an episode of palpitations.  However upon their arrival the symptom resolved and she was not transported.  She was seen by her cardiologist on September 9 for further evaluation the past syncopal event.  She has received a 30-day event monitor which she just received yesterday and has not started using yet.  The history is provided by the patient.       Past Medical History:  Diagnosis Date  . Abdominal adhesions 1994  . Allergic rhinitis   . Anemia   . Anxiety and depression   . Aortic stenosis   . Arthritis   . Atrial fibrillation ([place]) 10/2012   Associated  with severe anemia and esophageal pill impaction  . Breast carcinoma (HCC)    Right mastectomy  . Cholelithiasis   . Essential hypertension   . Gastroesophageal reflux disease    Hiatal hernia  . History of blood transfusion   . Hypothyroidism   . Low back pain   . 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 Nephrolithiasis 2004   Painless hematuria  . Short gut syndrome    Bowel resection , 2004  . Upper GI bleed 2004   Multiple episodes of melena-? due to gastritis or adverse drug effect (nonsteroidals, small bowel ulceration with Fosamax); caused by Pepto-Bismol during one Emergency Department evaluation    Patient Active Problem List   Diagnosis Date Noted  . Scoliosis 06/26/2020  . Thrombocytopenia ([place]) 06/22/2020  . Aspiration pneumonia ([place]) 06/22/2020  . Chronic constipation 06/21/2020  . Post-menopausal osteoporosis 06/21/2020  . Essential tremor 06/21/2020  . Vitamin B 12 deficiency 06/21/2020  . Major depression, recurrent, chronic ([place]) 06/21/2020  . Status post total replacement of right hip   . AKI (acute kidney injury) ([place])   . Postoperative anemia due to acute blood loss   . Closed right hip fracture ([place]) 06/14/2020  . IDA (iron deficiency anemia) 02/28/2020  . Atrial fibrillation with RVR ([place]) 12/25/2018  . GERD (gastroesophageal reflux disease) 12/25/2018  . Chronic cholecystitis s/p lap cholecystectomy 06/25/2018 06/18/2018  . Vagal reaction 04/12/2016  .  Abdominal pain 09/19/2014  . 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 Ecchymoses, spontaneous 05/31/2013  . Hypothyroid   . Atrial fibrillation ([place])   . Breast carcinoma ([place])   . Malabsorption   . CKD (chronic kidney disease) stage 3, GFR 30-59 ml/min (HCC) 10/21/2012  . Anemia, normocytic normochromic 07/06/2012  . Chronic diarrhea 02/10/2012  . Hypertension 02/10/2012    Past Surgical History:  Procedure Laterality Date  . ABDOMINAL HYSTERECTOMY      emergency s/p delivery  . ABDOMINAL HYSTERECTOMY  1960   massive gynecologic bleeding  . BOWEL RESECTION     Resulting short gut syndrome  . CHOLECYSTECTOMY N/A 06/25/2018   Procedure: LAPAROSCOPIC CHOLECYSTECTOMY WITH INTRAOPERATIVE CHOLANGIOGRAM;  Surgeon: Armandina Gemma, MD;  Location: [facility] ORS;  Service: General;  Laterality: N/A;  . COLONOSCOPY W/ POLYPECTOMY  2005   Lipoma; diverticulosis  . COLONOSCOPY WITH ESOPHAGOGASTRODUODENOSCOPY (EGD)  11/22/2012   Rehman  . HIP ARTHROPLASTY Right 06/15/2020   Procedure: ANTERIOR ARTHROPLASTY BIPOLAR HIP (HEMIARTHROPLASTY);  Surgeon: Mcarthur Rossetti, MD;  Location: [facility]S;  Service: Orthopedics;  Laterality: Right;  . LAPAROSCOPIC LYSIS OF ADHESIONS  1965   s/p adhesions  . MASTECTOMY  right breast  . MASTECTOMY     Carcinoma of the breast; right  . UPPER GASTROINTESTINAL ENDOSCOPY       OB History    Gravida      Para      Term      Preterm      AB      Living  0     SAB      TAB      Ectopic      Multiple      Live Births              Family History  Problem Relation Age of Onset  . Anuerysm Father   . Rheum arthritis Sister   . Healthy Sister   . COPD Sister   . Healthy Brother   . Cancer Other   . Colon cancer Neg Hx     Social History   Tobacco Use  . Smoking status: Former Smoker    Packs/day: 1.50    Years: 20.00    Pack years: 30.00    Types: Cigarettes  . Smokeless tobacco: Never Used  Vaping Use  . Vaping Use: Never used  Substance Use Topics  . Alcohol use: No  . Drug use: No    Home Medications Prior to Admission medications   Medication Sig Start Date End Date Taking? Authorizing Provider  acetaminophen (TYLENOL) 650 MG CR tablet Take 650 mg by mouth every 6 (six) hours. 06/28/20   [provider]  ALPRAZolam Duanne Moron) 1 MG tablet Take 1 tablet (1 mg total) by mouth at bedtime. 07/09/20   Gerlene Fee, NP  Cyanocobalamin (VITAMIN B-12 IJ) Inject as directed every  30 (thirty) days.    [provider]  DULoxetine (CYMBALTA) 30 MG capsule Take 2 capsules (60 mg total) by mouth daily. Patient taking differently: Take 90 mg by mouth daily.  07/09/20   Gerlene Fee, NP  ergocalciferol (VITAMIN D2) 1.25 MG (50000 UT) capsule Take by mouth.    [provider]  levothyroxine (SYNTHROID) 75 MCG tablet Take 1 tablet (75 mcg total) by mouth daily before breakfast. 07/09/20   Gerlene Fee, NP  metoprolol succinate (TOPROL-XL) 25 MG 24 hr tablet Take 1 tablet (25 mg total) by mouth daily. 07/09/20  Gerlene Fee, NP  Multiple Vitamins-Iron (MULTIVITAMINS WITH IRON) TABS tablet Take 1 tablet by mouth daily. 06/19/20   [provider]  NON FORMULARY Diet: _____ Regular, ___x___ NAS, _______Consistent Carbohydrate, _______NPO _____Other 06/19/20   [provider]  pantoprazole (PROTONIX) 40 MG tablet Take 1 tablet (40 mg total) by mouth 2 (two) times daily. Patient taking differently: Take 40 mg by mouth daily.  07/09/20   Gerlene Fee, NP  primidone (MYSOLINE) 50 MG tablet Take 1 tablet (50 mg total) by mouth at bedtime. 07/09/20   Gerlene Fee, NP  traMADol (ULTRAM) 50 MG tablet Take 1 tablet (50 mg total) by mouth every 6 (six) hours. 07/09/20   Gerlene Fee, NP    Allergies    Codeine  Review of Systems   Review of Systems  Constitutional: Negative for chills, fatigue and fever.  HENT: Negative for congestion and sore throat.   Eyes: Negative.   Respiratory: Negative for chest tightness and shortness of breath.   Cardiovascular: Negative for chest pain, palpitations and leg swelling.  Gastrointestinal: Negative for abdominal pain and nausea.  Genitourinary: Negative.   Musculoskeletal: Negative for arthralgias, joint swelling and neck pain.  Skin: Negative.  Negative for rash and wound.  Neurological: Positive for syncope. Negative for dizziness, weakness, light-headedness, numbness and headaches.    Psychiatric/Behavioral: Negative.   All other systems reviewed and are negative.   Physical Exam Updated Vital Signs BP (!) 149/78 (BP Location: Left Arm)   Pulse 91   Temp 98 F (36.7 C) (Oral)   Resp 17   Ht 5\' 1"  (1.549 m)   Wt 51.3 kg   SpO2 100%   BMI 21.35 kg/m   Physical Exam Vitals and nursing note reviewed.  Constitutional:      Appearance: She is well-developed.  HENT:     Head: Normocephalic and atraumatic.     Comments: No scalp tenderness, no hematoma. Eyes:     Extraocular Movements: Extraocular movements intact.     Conjunctiva/sclera: Conjunctivae normal.     Pupils: Pupils are equal, round, and reactive to light.  Cardiovascular:     Rate and Rhythm: Normal rate and regular rhythm.     Pulses: Normal pulses.     Heart sounds: Normal heart sounds.  Pulmonary:     Effort: Pulmonary effort is normal.     Breath sounds: Normal breath sounds. No wheezing.  Abdominal:     General: Bowel sounds are normal.     Palpations: Abdomen is soft.     Tenderness: There is no abdominal tenderness.  Musculoskeletal:        General: Normal range of motion.     Cervical back: Normal range of motion.     Right lower leg: No edema.     Left lower leg: No edema.  Skin:    General: Skin is warm and dry.     Capillary Refill: Capillary refill takes less than 2 seconds.  Neurological:     General: No focal deficit present.     Mental Status: She is alert and oriented to person, place, and time.     Cranial Nerves: No cranial nerve deficit.     Motor: No weakness.     Gait: Gait normal.     Comments: Slow but stable gait     ED Results / Procedures / Treatments   Labs (all labs ordered are listed, but only abnormal results are displayed) Labs Reviewed  BASIC METABOLIC PANEL -  Abnormal; Notable for the following components:      Result Value   CO2 20 (*)    Glucose, Bld 126 (*)    BUN 26 (*)    Creatinine, Ser 1.41 (*)    GFR calc non Af Amer 32 (*)    GFR  calc Af Amer 37 (*)    All other components within normal limits  CBC WITH DIFFERENTIAL/PLATELET - Abnormal; Notable for the following components:   RBC 3.50 (*)    Hemoglobin 10.3 (*)    HCT 33.0 (*)    All other components within normal limits  CBG MONITORING, ED - Abnormal; Notable for the following components:   Glucose-Capillary 117 (*)    All other components within normal limits  MAGNESIUM    EKG EKG Interpretation  Date/Time:  Thursday August 30 2020 11:53:36 EDT Ventricular Rate:  83 PR Interval:  174 QRS Duration: 74 QT Interval:  384 QTC Calculation: 451 R Axis:   21 Text Interpretation: Normal sinus rhythm Normal ECG Since last tracing rate slower Otherwise no significant change Confirmed by Daleen Bo [phone]) on 08/30/2020 1:56:20 PM   Radiology CT Head Wo Contrast  Result Date: 08/30/2020 CLINICAL DATA:  Head trauma, loss of consciousness EXAM: CT HEAD WITHOUT CONTRAST TECHNIQUE: Contiguous axial images were obtained from the base of the skull through the vertex without intravenous contrast. COMPARISON:  None. FINDINGS: Brain: Normal anatomic configuration. Parenchymal volume loss is commensurate with the patient's age. Moderate periventricular white matter changes are present likely reflecting the sequela of small vessel ischemia. No abnormal intra or extra-axial mass lesion or fluid collection. No abnormal mass effect or midline shift. No evidence of acute intracranial hemorrhage or infarct. Ventricular size is normal. Cerebellum unremarkable. Vascular: No asymmetric hyperdense vasculature at the skull base. Skull: Intact Sinuses/Orbits: Paranasal sinuses are clear. Orbits are unremarkable. Other: Mastoid air cells and middle ear cavities are clear. IMPRESSION: 1. No acute intracranial abnormality. 2. Moderate periventricular white matter changes likely reflecting the sequela of small vessel ischemia. Electronically Signed   By: Fidela Salisbury MD   On: 08/30/2020  18:12   DG Chest Portable 1 View  Result Date: 08/30/2020 CLINICAL DATA:  Syncope EXAM: PORTABLE CHEST 1 VIEW COMPARISON:  06/15/2020 FINDINGS: The lungs are mildly hyperinflated, suggesting changes of underlying COPD, but are stable since prior examination and are clear. Opacity of the right hemithorax likely relates to overlying breast implant. No pneumothorax or pleural effusion. Cardiac size within normal limits. Pulmonary vascularity is normal. Surgical clips are seen within the right axilla. No acute bone abnormality. IMPRESSION: 1. No acute cardiopulmonary disease. 2. Mild hyperinflation of the lungs, suggesting changes of underlying COPD. Electronically Signed   By: Fidela Salisbury MD   On: 08/30/2020 18:13    Procedures Procedures (including critical care time)  Medications Ordered in ED Medications - No data to display  ED Course  I have reviewed the triage vital signs and the nursing notes.  Pertinent labs & imaging results that were available during my care of the patient were reviewed by me and considered in my medical decision making (see chart for details).    MDM Rules/Calculators/A&P                          Patient with an episode of syncope which she has had previously in July, she hit her head with this morning's event but denies any persistent headache, no focal weakness.  She  has been seen by her cardiologist for this concern who has ordered an event monitor for her to wear for the next 30 days.  She received this monitor yesterday but has not started wearing it yet, concern being for rapid A. fib or conversion pauses from A. fib transition.  Patient states she cannot tell when she is in A. fib, her EKG today is sinus rhythm.  Today's episode included a prodrome of weakness and lightheadedness, favoring orthostasis over cardiac source however.  Her episode in July with sudden onset without prodrome.  Her labs and exam are reassuring today.  She is not orthostatic on today's  exam.  She was encouraged to start wearing the cardiac monitor as soon as possible, preferably when she returns home this evening.  Advise close follow-up with cardiology, returning here for any new or worsened or persistent symptoms.  She was also advised that if she feels lightheaded she should sit or lie down immediately to avoid injury and possibly avert a syncopal event.  Patient understands this plan.  Of note CT imaging of her head also completed given minor head injury with today's fall, no intracranial injury present.  Patient was seen by Dr. Sabra Heck prior to discharge home. Final Clinical Impression(s) / ED Diagnoses Final diagnoses:  Syncope and collapse    Rx / DC Orders ED Discharge Orders    None       Landis Martins 08/31/20 0009    Evalee Jefferson, PA-C 08/31/20 0010    Noemi Chapel, MD 09/07/20 [phone]

## 2020-08-30 NOTE — Discharge Instructions (Addendum)
Your lab tests, ekg and exam today are reassuring. It will be important for you to start wearing the heart monitor your cardiologist wants you to wear to better be able to determine if your syncopal episodes are related to an abnormal heart beat.  In the interim, it is very important for you to sit or lie down if you recognize you are getting lightheaded in order to avoid injury from falling. Also, call Dr. Gerarda Fraction for a close office followup within the next week.

## 2020-08-30 NOTE — ED Triage Notes (Signed)
Pt passed out when going to the bathroom and hit her on the floor.  Denies taking any blood thinners. Pt states she has not slept for past two nights.

## 2020-09-02 DIAGNOSIS — R55 Syncope and collapse: Secondary | ICD-10-CM | POA: Diagnosis not present

## 2020-09-03 ENCOUNTER — Ambulatory Visit (INDEPENDENT_AMBULATORY_CARE_PROVIDER_SITE_OTHER): Payer: Medicare Other

## 2020-09-03 ENCOUNTER — Other Ambulatory Visit: Payer: Self-pay

## 2020-09-03 DIAGNOSIS — R55 Syncope and collapse: Secondary | ICD-10-CM

## 2020-09-03 DIAGNOSIS — I480 Paroxysmal atrial fibrillation: Secondary | ICD-10-CM | POA: Diagnosis not present

## 2020-09-03 NOTE — Addendum Note (Signed)
Addended by: Levonne Hubert on: 09/03/2020 04:25 PM   Modules accepted: Orders

## 2020-09-04 ENCOUNTER — Encounter (HOSPITAL_COMMUNITY): Payer: Medicare Other | Admitting: Physical Therapy

## 2020-09-06 ENCOUNTER — Encounter (HOSPITAL_COMMUNITY): Payer: Medicare Other

## 2020-09-10 ENCOUNTER — Ambulatory Visit (HOSPITAL_COMMUNITY): Payer: Medicare Other | Admitting: Physical Therapy

## 2020-09-10 ENCOUNTER — Telehealth (HOSPITAL_COMMUNITY): Payer: Self-pay | Admitting: Physical Therapy

## 2020-09-10 DIAGNOSIS — G4700 Insomnia, unspecified: Secondary | ICD-10-CM | POA: Diagnosis not present

## 2020-09-10 DIAGNOSIS — I350 Nonrheumatic aortic (valve) stenosis: Secondary | ICD-10-CM | POA: Diagnosis not present

## 2020-09-10 DIAGNOSIS — I4891 Unspecified atrial fibrillation: Secondary | ICD-10-CM | POA: Diagnosis not present

## 2020-09-10 DIAGNOSIS — Z681 Body mass index (BMI) 19 or less, adult: Secondary | ICD-10-CM | POA: Diagnosis not present

## 2020-09-10 DIAGNOSIS — R5383 Other fatigue: Secondary | ICD-10-CM | POA: Diagnosis not present

## 2020-09-10 DIAGNOSIS — R55 Syncope and collapse: Secondary | ICD-10-CM | POA: Diagnosis not present

## 2020-09-10 NOTE — Telephone Encounter (Signed)
Patient no show for appointment. Left voicemail for patient making her aware of missed appointment and reminding her of next appointment.   2:38 PM, 09/10/20 Mearl Latin PT, DPT Physical Therapist at [facility]

## 2020-09-13 ENCOUNTER — Ambulatory Visit (HOSPITAL_COMMUNITY): Payer: Medicare Other | Admitting: Physical Therapy

## 2020-09-17 ENCOUNTER — Telehealth (HOSPITAL_COMMUNITY): Payer: Self-pay | Admitting: Physical Therapy

## 2020-09-17 ENCOUNTER — Ambulatory Visit (HOSPITAL_COMMUNITY): Payer: Medicare Other | Attending: Orthopedic Surgery | Admitting: Physical Therapy

## 2020-09-17 DIAGNOSIS — M6281 Muscle weakness (generalized): Secondary | ICD-10-CM | POA: Insufficient documentation

## 2020-09-17 DIAGNOSIS — R29898 Other symptoms and signs involving the musculoskeletal system: Secondary | ICD-10-CM | POA: Insufficient documentation

## 2020-09-17 DIAGNOSIS — R2689 Other abnormalities of gait and mobility: Secondary | ICD-10-CM | POA: Insufficient documentation

## 2020-09-17 DIAGNOSIS — M25551 Pain in right hip: Secondary | ICD-10-CM | POA: Insufficient documentation

## 2020-09-17 DIAGNOSIS — M545 Low back pain, unspecified: Secondary | ICD-10-CM | POA: Insufficient documentation

## 2020-09-17 NOTE — Telephone Encounter (Signed)
Patient no show for appointment. Spoke with patient regarding missed appointment and reminded her of next appointment. Instructed patient to call if not able come in for next appointment.   11:21 AM, 09/17/20 Mearl Latin PT, DPT Physical Therapist at [facility]

## 2020-09-18 ENCOUNTER — Ambulatory Visit (INDEPENDENT_AMBULATORY_CARE_PROVIDER_SITE_OTHER): Payer: Medicare Other | Admitting: Obstetrics & Gynecology

## 2020-09-18 ENCOUNTER — Encounter: Payer: Self-pay | Admitting: Obstetrics & Gynecology

## 2020-09-18 VITALS — BP 154/75 | HR 82 | Ht 61.0 in | Wt 118.0 lb

## 2020-09-18 DIAGNOSIS — C561 Malignant neoplasm of right ovary: Secondary | ICD-10-CM | POA: Diagnosis not present

## 2020-09-18 DIAGNOSIS — N83201 Unspecified ovarian cyst, right side: Secondary | ICD-10-CM | POA: Diagnosis not present

## 2020-09-18 NOTE — Progress Notes (Signed)
Follow up appointment for results  Chief Complaint  Patient presents with  . pelvic mass    had ct scan on 08/24/20    Blood pressure (!) 154/75, pulse 82, height 5\' 1"  (1.549 m), weight 118 lb (53.5 kg).    Narrative & Impression  CLINICAL DATA:  Generalized abdominal pain, 15 pound weight loss, history of breast cancer  EXAM: CT ABDOMEN AND PELVIS WITHOUT CONTRAST  TECHNIQUE: Multidetector CT imaging of the abdomen and pelvis was performed following the standard protocol without IV contrast.  COMPARISON:  05/01/2017  FINDINGS: Lower chest: No acute pleural or parenchymal lung disease.  Hepatobiliary: Limited unenhanced imaging of the liver demonstrates no focal abnormality. The gallbladder surgically absent.  Pancreas: Unremarkable. No pancreatic ductal dilatation or surrounding inflammatory changes.  Spleen: Normal in size without focal abnormality.  Adrenals/Urinary Tract: There is a large staghorn calculus within the right kidney measuring up to 4.6 cm in size. There is progressive right renal cortical atrophy. There is a 1.5 cm hyperdense structure within the lower pole right kidney, reference image 31 of series 2, favor proteinaceous or hemorrhagic cyst. There is a large simple appearing cyst within the upper pole left kidney, measuring up to 3.6 cm. Punctate 3 mm nonobstructing calculus is also seen within the lower pole left kidney.  No obstructive uropathy. Bladder is decompressed which limits its evaluation. The adrenals are normal.  Stomach/Bowel: No bowel obstruction or ileus. Postsurgical changes are seen from previous small bowel resection. Moderate stool within the distal colon. No bowel wall thickening or inflammatory change.  Vascular/Lymphatic: Aortic atherosclerosis. No enlarged abdominal or pelvic lymph nodes.  Reproductive: Status post hysterectomy. 4.0 x 2.8 cm right adnexal cyst has increased in size, previously having  measured 3.2 x 2.1 cm. Left adnexa is unremarkable.  Other: No free fluid or free gas.  No abdominal wall hernia.  Musculoskeletal: Right hip arthroplasty is noted. There are no acute bony abnormalities. Reconstructed images demonstrate no additional findings.  IMPRESSION: 1. Stable right renal staghorn calculus, with progressive right renal cortical atrophy. 2. Indeterminate 1.5 cm hyperdense structure lower pole right kidney, likely a proteinaceous or hemorrhagic cyst. 3. Punctate nonobstructing left renal calculus. 4. Moderate stool throughout the colon. No bowel obstruction or ileus. 5. 4 cm right adnexal cyst. Recommend follow-up US in 6-12 months. Note: This recommendation does not apply to premenarchal patients and to those with increased risk (genetic, family history, elevated tumor markers or other high-risk factors) of ovarian cancer. Reference: JACR 2020 Feb; 17(2):248-254 6.  Aortic Atherosclerosis (ICD10-I70.0).   Electronically Signed   By: Randa Ngo M.D.   On: 08/24/2020 22:51     MEDS ordered this encounter: No orders of the defined types were placed in this encounter.   Orders for this encounter: Orders Placed This Encounter  Procedures  . CA 125    Impression: 1. Cyst of right ovary  - CA 125  2. Malignant neoplasm of right ovary ([place])  - CA 125   Plan: Minimally enlarged from 2016 scan Check CA 125 and if normal no follow up is needed  sinceit has been stable for so long  Follow Up: Return if symptoms worsen or fail to improve.       Face to face time:  15 minutes  Greater than 50% of the visit time was spent in counseling and coordination of care with the patient.  The summary and outline of the counseling and care coordination is summarized in the note above.  All questions were answered.  Past Medical History:  Diagnosis Date  . Abdominal adhesions 1994  . Allergic rhinitis   . Anemia   . Anxiety and depression    . Aortic stenosis   . Arthritis   . Atrial fibrillation ([place]) 10/2012   Associated with severe anemia and esophageal pill impaction  . Breast carcinoma (HCC)    Right mastectomy  . Cholelithiasis   . Essential hypertension   . Gastroesophageal reflux disease    Hiatal hernia  . History of blood transfusion   . Hypothyroidism   . Low back pain   . 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 Nephrolithiasis 2004   Painless hematuria  . Short gut syndrome    Bowel resection , 2004  . Upper GI bleed 2004   Multiple episodes of melena-? due to gastritis or adverse drug effect (nonsteroidals, small bowel ulceration with Fosamax); caused by Pepto-Bismol during one Emergency Department evaluation    Past Surgical History:  Procedure Laterality Date  . ABDOMINAL HYSTERECTOMY     emergency s/p delivery  . ABDOMINAL HYSTERECTOMY  1960   massive gynecologic bleeding  . BOWEL RESECTION     Resulting short gut syndrome  . CHOLECYSTECTOMY N/A 06/25/2018   Procedure: LAPAROSCOPIC CHOLECYSTECTOMY WITH INTRAOPERATIVE CHOLANGIOGRAM;  Surgeon: Armandina Gemma, MD;  Location: [facility];  Service: General;  Laterality: N/A;  . COLONOSCOPY W/ POLYPECTOMY  2005   Lipoma; diverticulosis  . COLONOSCOPY WITH ESOPHAGOGASTRODUODENOSCOPY (EGD)  11/22/2012   Rehman  . HIP ARTHROPLASTY Right 06/15/2020   Procedure: ANTERIOR ARTHROPLASTY BIPOLAR HIP (HEMIARTHROPLASTY);  Surgeon: Mcarthur Rossetti, MD;  Location: [facility];  Service: Orthopedics;  Laterality: Right;  . LAPAROSCOPIC LYSIS OF ADHESIONS  1965   s/p adhesions  . MASTECTOMY  right breast  . MASTECTOMY     Carcinoma of the breast; right  . UPPER GASTROINTESTINAL ENDOSCOPY      OB History    Gravida      Para      Term      Preterm      AB      Living  0     SAB      TAB      Ectopic      Multiple      Live Births              Allergies  Allergen Reactions  . Codeine Nausea And  Vomiting    Social History   Socioeconomic History  . Marital status: Widowed    Spouse name: Not on file  . Number of children: Not on file  . Years of education: 38  . Highest education level: Not on file  Occupational History  . Not on file  Tobacco Use  . Smoking status: Former Smoker    Packs/day: 1.50    Years: 20.00    Pack years: 30.00    Types: Cigarettes  . Smokeless tobacco: Never Used  Vaping Use  . Vaping Use: Never used  Substance and Sexual Activity  . Alcohol use: No  . Drug use: No  . Sexual activity: Never  Other Topics Concern  . Not on file  Social History Narrative  . Not on file   Social Determinants of Health   Financial Resource Strain:   . Difficulty of Paying Living Expenses: Not on file  Food Insecurity:   . Worried About Charity fundraiser in the Last Year:  Not on file  . Ran Out of Food in the Last Year: Not on file  Transportation Needs:   . Lack of Transportation (Medical): Not on file  . Lack of Transportation (Non-Medical): Not on file  Physical Activity:   . Days of Exercise per Week: Not on file  . Minutes of Exercise per Session: Not on file  Stress:   . Feeling of Stress : Not on file  Social Connections:   . Frequency of Communication with Friends and Family: Not on file  . Frequency of Social Gatherings with Friends and Family: Not on file  . Attends Religious Services: Not on file  . Active Member of Clubs or Organizations: Not on file  . Attends Archivist Meetings: Not on file  . Marital Status: Not on file    Family History  Problem Relation Age of Onset  . Anuerysm Father   . Rheum arthritis Sister   . Healthy Sister   . COPD Sister   . Healthy Brother   . Cancer Other   . Colon cancer Neg Hx

## 2020-09-19 ENCOUNTER — Encounter (HOSPITAL_COMMUNITY): Payer: Self-pay | Admitting: Physical Therapy

## 2020-09-19 ENCOUNTER — Ambulatory Visit (HOSPITAL_COMMUNITY): Payer: Medicare Other | Admitting: Physical Therapy

## 2020-09-19 ENCOUNTER — Other Ambulatory Visit: Payer: Self-pay

## 2020-09-19 DIAGNOSIS — R29898 Other symptoms and signs involving the musculoskeletal system: Secondary | ICD-10-CM | POA: Diagnosis not present

## 2020-09-19 DIAGNOSIS — M25551 Pain in right hip: Secondary | ICD-10-CM

## 2020-09-19 DIAGNOSIS — M545 Low back pain, unspecified: Secondary | ICD-10-CM | POA: Diagnosis not present

## 2020-09-19 DIAGNOSIS — R2689 Other abnormalities of gait and mobility: Secondary | ICD-10-CM | POA: Diagnosis not present

## 2020-09-19 DIAGNOSIS — M6281 Muscle weakness (generalized): Secondary | ICD-10-CM

## 2020-09-19 LAB — CA 125: Cancer Antigen (CA) 125: 5.1 U/mL (ref 0.0–38.1)

## 2020-09-19 NOTE — Therapy (Signed)
[facility] [address] [place], [place], [address] Phone: [phone]   Fax:  [phone]  Physical Therapy Treatment/Discharge Summary  Patient Details  Name: Jane Andrews MRN: [id number] Date of Birth: 10-28-1925 Referring Provider (PT): Unice Cobble MD   Encounter Date: 09/19/2020   PHYSICAL THERAPY DISCHARGE SUMMARY  Visits from Start of Care: 3  Current functional level related to goals / functional outcomes: See below   Remaining deficits: See below   Education / Equipment: See below  Plan: Patient agrees to discharge.  Patient goals were met. Patient is being discharged due to being pleased with the current functional level.  ?????         PT End of Session - 09/19/20 1000    Visit Number 3    Number of Visits 16    Date for PT Re-Evaluation 09/24/20    Authorization Type Primary Medicare Secondary BCBS- V.L 75 no auth    Authorization - Visit Number 3    Authorization - Number of Visits 75    Progress Note Due on Visit 10    PT Start Time 1002    PT Stop Time 1033    PT Time Calculation (min) 31 min    Activity Tolerance Patient tolerated treatment well    Behavior During Therapy WFL for tasks assessed/performed           Past Medical History:  Diagnosis Date   Abdominal adhesions 1994   Allergic rhinitis    Anemia    Anxiety and depression    Aortic stenosis    Arthritis    Atrial fibrillation ([place]) 10/2012   Associated with severe anemia and esophageal pill impaction   Breast carcinoma (HCC)    Right mastectomy   Cholelithiasis    Essential hypertension    Gastroesophageal reflux disease    Hiatal hernia   History of blood transfusion    Hypothyroidism    Low back pain    Malabsorption    Short gut syndrome following small bowel resection surgery x2   Nephrolithiasis 2004   Painless hematuria   Short gut syndrome    Bowel resection , 2004   Upper GI bleed 2004    Multiple episodes of melena-? due to gastritis or adverse drug effect (nonsteroidals, small bowel ulceration with Fosamax); caused by Pepto-Bismol during one Emergency Department evaluation    Past Surgical History:  Procedure Laterality Date   ABDOMINAL HYSTERECTOMY     emergency s/p delivery   ABDOMINAL HYSTERECTOMY  1960   massive gynecologic bleeding   BOWEL RESECTION     Resulting short gut syndrome   CHOLECYSTECTOMY N/A 06/25/2018   Procedure: LAPAROSCOPIC CHOLECYSTECTOMY WITH INTRAOPERATIVE CHOLANGIOGRAM;  Surgeon: Armandina Gemma, MD;  Location: [facility];  Service: General;  Laterality: N/A;   COLONOSCOPY W/ POLYPECTOMY  2005   Lipoma; diverticulosis   COLONOSCOPY WITH ESOPHAGOGASTRODUODENOSCOPY (EGD)  11/22/2012   [facility]   HIP ARTHROPLASTY Right 06/15/2020   Procedure: ANTERIOR ARTHROPLASTY BIPOLAR HIP (HEMIARTHROPLASTY);  Surgeon: Mcarthur Rossetti, MD;  Location: [facility];  Service: Orthopedics;  Laterality: Right;   LAPAROSCOPIC LYSIS OF ADHESIONS  1965   s/p adhesions   MASTECTOMY  right breast   MASTECTOMY     Carcinoma of the breast; right   UPPER GASTROINTESTINAL ENDOSCOPY      There were no vitals filed for this visit.   Subjective Assessment - 09/19/20 0959    Subjective Patient states she has good days and bad days.  She does some of her exercises sometimes. She was feeling really good Monday and is overall feeling better. She states 70% improvement since beginning therapy.    Limitations House hold activities;Walking;Lifting    How long can you walk comfortably? 3 minutes    Patient Stated Goals A workout for her hip and decrease back pain    Currently in Pain? No/denies              [facility] PT Assessment - 09/19/20 0001      Assessment   Medical Diagnosis R femur Fx     Referring Provider (PT) Unice Cobble MD    Onset Date/Surgical Date 06/15/20    Prior [place]      Precautions   Precautions Anterior Hip    Precaution  Comments Not aware of anything      Restrictions   Weight Bearing Restrictions No      Balance Screen   Has the patient fallen in the past 6 months Yes    How many times? 1    Has the patient had a decrease in activity level because of a fear of falling?  No    Is the patient reluctant to leave their home because of a fear of falling?  No      Home Ecologist residence    Living Arrangements Alone      Prior Function   Level of Independence Independent      Cognition   Overall Cognitive Status Within Functional Limits for tasks assessed      Observation/Other Assessments   Observations Ambulates without AD, slow, labored cadence    Focus on Therapeutic Outcomes (FOTO)  not completed      Sensation   Light Touch Appears Intact      AROM   Lumbar Flexion 0% limited    Lumbar Extension 50% limited     Lumbar - Right Side Bend 0% limited    Lumbar - Left Side Bend 0% limited    Lumbar - Right Rotation 0% limited    Lumbar - Left Rotation 0% limited      Strength   Right Hip Flexion 4/5    Right Hip ABduction 4+/5    Left Hip Flexion 4/5      Transfers   Five time sit to stand comments  11.3 seconds    Comments weight shift off R hip, without UE use, dynamic knee valgus                         [facility] Adult PT Treatment/Exercise - 09/19/20 0001      Knee/Hip Exercises: Standing   Functional Squat 2 sets;10 reps    SLS with Vectors 5x5 second holds bilateral      Knee/Hip Exercises: Sidelying   Hip ABduction Both;2 sets;10 reps                  PT Education - 09/19/20 1028    Education Details Educated on reassessment findings, HEP, exercise technique, returning to physical therapy if needed.    Person(s) Educated Patient    Methods Explanation;Demonstration;Handout    Comprehension Verbalized understanding;Returned demonstration            PT Short Term Goals - 09/19/20 1009      PT SHORT TERM GOAL #1    Title Patient will be independent with HEP in order to improve functional outcomes.    Time  2    Period Weeks    Status Achieved    Target Date 08/06/20      PT SHORT TERM GOAL #2   Title Patient will report at least 25% improvement in symptoms for improved quality of life.    Time 2    Period Weeks    Status Achieved    Target Date 08/06/20             PT Long Term Goals - 09/19/20 1014      PT LONG TERM GOAL #1   Title Patient will report at least 75% improvement in symptoms for improved quality of life.    Time 4    Period Weeks    Status Partially Met      PT LONG TERM GOAL #2   Title Patient will be able to complete 5x STS in under 11.4 seconds in order to reduce the risk of falls.    Time 4    Period Weeks    Status Achieved      PT LONG TERM GOAL #3   Title Patient will be able to ambulate at least 300 feet in 2MWT in order to demonstrate improved gait speed for community ambulation.    Time 4    Period Weeks    Status Achieved                 Plan - 09/19/20 1000    Clinical Impression Statement Patient has met 2/2 short term goals and 2/3 long term goals with remaining goal being partially met. Patient has been able to perform HEP although not as frequent as prescribed and notes improved symptoms. Patient showing improving LE strength, gait, and functional mobility leading to meeting long term goals. Patient continues to be limited by slight weakness and intermittent back pain. Patient agreeable to discharge with HEP. Patient educated on how to perform STS exercise at home. Patient requires frequent verbal cueing and demonstration for squatting mechanics initially but improves following cueing. Patient instructed on not compensating for hip abduction ROM with excessive trunk movement and proper positioning with good carry over. Patient given handouts with HEP exercises. Patient discharged from physical therapy at this time.    Personal Factors and  Comorbidities Age;Social Background;Comorbidity 3+    Comorbidities chronic low back pain, falls, lives alone, scolosis    Examination-Activity Limitations Bend;Squat;Stand;Stairs;Transfers;Locomotion Level;Lift    Examination-Participation Restrictions Cleaning;Shop;Volunteer;Yard Work    Merchant navy officer --    Rehab Potential Fair    PT Frequency --    PT Duration --    PT Treatment/Interventions ADLs/Self Care Home Management;Aquatic Therapy;Biofeedback;Cryotherapy;Electrical Stimulation;Iontophoresis 39m/ml Dexamethasone;Moist Heat;Traction;Ultrasound;Contrast Bath;DME Instruction;Gait training;Stair training;Functional mobility training;Therapeutic activities;Therapeutic exercise;Balance training;Neuromuscular re-education;Patient/family education;Orthotic Fit/Training;Manual techniques;Manual lymph drainage;Compression bandaging;Scar mobilization;Passive range of motion;Dry needling;Energy conservation;Splinting;Taping;Spinal Manipulations;Joint Manipulations    PT Next Visit Plan continue core and hip strengthening, functional strengthening with stairs, STS, add core and lumbar/postural exercises as able, possibly begin gait and balance training    PT Home Exercise Plan 07/23/20 standing hip abduction 9/13 STS, marching, bridge. 10/6 sidelying hip abd, SLS with vectors, squat    Consulted and Agree with Plan of Care Patient           Patient will benefit from skilled therapeutic intervention in order to improve the following deficits and impairments:  Abnormal gait, Decreased activity tolerance, Decreased balance, Decreased mobility, Decreased strength, Decreased endurance, Decreased range of motion, Difficulty walking, Impaired flexibility, Pain  Visit Diagnosis: Pain in right hip  Low  back pain, unspecified back pain laterality, unspecified chronicity, unspecified whether sciatica present  Muscle weakness (generalized)  Other abnormalities of gait and  mobility  Other symptoms and signs involving the musculoskeletal system     Problem List Patient Active Problem List   Diagnosis Date Noted   Scoliosis 06/26/2020   Thrombocytopenia ([place]) 06/22/2020   Aspiration pneumonia ([place]) 06/22/2020   Chronic constipation 06/21/2020   Post-menopausal osteoporosis 06/21/2020   Essential tremor 06/21/2020   Vitamin B 12 deficiency 06/21/2020   Major depression, recurrent, chronic ([place]) 06/21/2020   Status post total replacement of right hip    AKI (acute kidney injury) ([place])    Postoperative anemia due to acute blood loss    Closed right hip fracture (HCC) 06/14/2020   IDA (iron deficiency anemia) 02/28/2020   Atrial fibrillation with RVR ([place]) 12/25/2018   GERD (gastroesophageal reflux disease) 12/25/2018   Chronic cholecystitis s/p lap cholecystectomy 06/25/2018 06/18/2018   Vagal reaction 04/12/2016   Abdominal pain 09/19/2014   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 [place], [place], [address] Phone: [phone]   Fax:  [phone]  Name: Jane Andrews MRN: [id number] Date of Birth: [birth year]

## 2020-09-19 NOTE — Patient Instructions (Signed)
Access Code: [email or web address] URL: [email or web address] Date: 09/19/2020 Prepared by: Mitzi Hansen Tion Tse  Exercises Squat with Counter Support - 1 x daily - 7 x weekly - 2 sets - 10 reps Sidelying Hip Abduction - 1 x daily - 7 x weekly - 2 sets - 10 reps Single Leg Balance with Clock Reach - 1 x daily - 7 x weekly - 5 reps - 5 second hold

## 2020-09-20 DIAGNOSIS — I129 Hypertensive chronic kidney disease with stage 1 through stage 4 chronic kidney disease, or unspecified chronic kidney disease: Secondary | ICD-10-CM | POA: Diagnosis not present

## 2020-09-20 DIAGNOSIS — I5032 Chronic diastolic (congestive) heart failure: Secondary | ICD-10-CM | POA: Diagnosis not present

## 2020-09-20 DIAGNOSIS — N170 Acute kidney failure with tubular necrosis: Secondary | ICD-10-CM | POA: Diagnosis not present

## 2020-09-20 DIAGNOSIS — D638 Anemia in other chronic diseases classified elsewhere: Secondary | ICD-10-CM | POA: Diagnosis not present

## 2020-09-20 DIAGNOSIS — N1832 Chronic kidney disease, stage 3b: Secondary | ICD-10-CM | POA: Diagnosis not present

## 2020-09-21 DIAGNOSIS — D510 Vitamin B12 deficiency anemia due to intrinsic factor deficiency: Secondary | ICD-10-CM | POA: Diagnosis not present

## 2020-09-21 DIAGNOSIS — B351 Tinea unguium: Secondary | ICD-10-CM | POA: Diagnosis not present

## 2020-09-22 ENCOUNTER — Telehealth: Payer: Self-pay | Admitting: Physician Assistant

## 2020-09-22 NOTE — Telephone Encounter (Signed)
Paged by [employer] regarding manual transmission for syncope and chest pain, underlying rhythm could not be interpreted due to artifact and lead loss.  Her monitor company could not reach the patient.  I managed to reach out to the patient, she says she has problem having the heart monitor stick on her, also she did not have any chest pain or syncope, the button was pushed by accident.  I have informed [employer].

## 2020-10-03 ENCOUNTER — Ambulatory Visit: Payer: Medicare Other | Admitting: Cardiology

## 2020-10-05 ENCOUNTER — Ambulatory Visit: Payer: Medicare Other | Admitting: Cardiology

## 2020-10-17 ENCOUNTER — Ambulatory Visit (INDEPENDENT_AMBULATORY_CARE_PROVIDER_SITE_OTHER): Payer: Medicare Other | Admitting: Student

## 2020-10-17 ENCOUNTER — Other Ambulatory Visit: Payer: Self-pay

## 2020-10-17 ENCOUNTER — Encounter: Payer: Self-pay | Admitting: Student

## 2020-10-17 VITALS — BP 132/82 | HR 76 | Ht 61.0 in | Wt 115.0 lb

## 2020-10-17 DIAGNOSIS — I350 Nonrheumatic aortic (valve) stenosis: Secondary | ICD-10-CM | POA: Diagnosis not present

## 2020-10-17 DIAGNOSIS — N1832 Chronic kidney disease, stage 3b: Secondary | ICD-10-CM

## 2020-10-17 DIAGNOSIS — I10 Essential (primary) hypertension: Secondary | ICD-10-CM

## 2020-10-17 DIAGNOSIS — I480 Paroxysmal atrial fibrillation: Secondary | ICD-10-CM | POA: Diagnosis not present

## 2020-10-17 NOTE — Patient Instructions (Signed)
Medication Instructions:  Your physician recommends that you continue on your current medications as directed. Please refer to the Current Medication list given to you today.  *If you need a refill on your cardiac medications before your next appointment, please call your pharmacy*   Lab Work: NONE   If you have labs (blood work) drawn today and your tests are completely normal, you will receive your results only by: MyChart Message (if you have MyChart) OR A paper copy in the mail If you have any lab test that is abnormal or we need to change your treatment, we will call you to review the results.   Testing/Procedures: NONE    Follow-Up: At [facility], you and your health needs are our priority.  As part of our continuing mission to provide you with exceptional heart care, we have created designated Provider Care Teams.  These Care Teams include your primary Cardiologist (physician) and Advanced Practice Providers (APPs -  Physician Assistants and Nurse Practitioners) who all work together to provide you with the care you need, when you need it.  We recommend signing up for the patient portal called "MyChart".  Sign up information is provided on this After Visit Summary.  MyChart is used to connect with patients for Virtual Visits (Telemedicine).  Patients are able to view lab/test results, encounter notes, upcoming appointments, etc.  Non-urgent messages can be sent to your provider as well.   To learn more about what you can do with MyChart, go to [email or web address].    Your next appointment:   1 year(s)  The format for your next appointment:   In Person  Provider:   Samuel McDowell, MD or Brittany Strader, PA-C   Other Instructions Thank you for choosing  [facility]!    

## 2020-10-17 NOTE — Progress Notes (Signed)
Cardiology Office Note    Date:  10/17/2020   ID:  Jane Andrews, DOB [birth year], MRN [id number]  PCP:  Redmond School, MD  Cardiologist: Rozann Lesches, MD    Chief Complaint  Patient presents with  . Follow-up    recent echo and event monitor    History of Present Illness:    Jane Andrews is a 86 y.o. female with past medical history of paroxysmal atrial fibrillation (no longer on anticoagulation given recurrent GIB), aortic stenosis, HTN, GERD, Stage 3-4 CKD and prior GIB who presents to the office today for 5-week follow-up.  She was last examined by Jane Kicks, NP in 08/2020 and reported having worsening fatigue since her hospitalization for a hip fracture and PNA in 06/2020. She did report having a syncopal episodes at the time that she fractured her hip in 06/2020. Given her prior syncopal episode, a 30-day event monitor was recommended along with repeat limited echocardiogram to assess for any significant abnormalities. Repeat limited echocardiogram showed a preserved EF of 60 to 65% with moderate LVH. RV function was normal. She did have mild to moderate aortic stenosis which was similar to prior imaging. Her event monitor showed normal sinus rhythm with an average heart rate of 72 bpm. In the report, there was mention of a syncopal episode on 09/22/2020 but by review of a phone note with that same date she had accidentally pushed the button and did not experience chest pain or syncope.  In talking with the patient and her niece today, she reports overall doing well since her last visit. She did recently celebrate her 89rd birthday! She continues to live by herself and still drives and is able to perform ADL's independently. She denies any recent chest pain or dyspnea on exertion. No recent orthopnea, PND or lower extremity edema. No recent dizziness or recurrent syncopal episodes. She confirms today that she did not have a syncopal episode while wearing her monitor  but accidentally pushed the button.    Past Medical History:  Diagnosis Date  . Abdominal adhesions 1994  . Allergic rhinitis   . Anemia   . Anxiety and depression   . Aortic stenosis   . Arthritis   . Atrial fibrillation ([place]) 10/2012   Associated with severe anemia and esophageal pill impaction  . Breast carcinoma (HCC)    Right mastectomy  . Cholelithiasis   . Essential hypertension   . Gastroesophageal reflux disease    Hiatal hernia  . History of blood transfusion   . Hypothyroidism   . Low back pain   . 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 Nephrolithiasis 2004   Painless hematuria  . Short gut syndrome    Bowel resection , 2004  . Upper GI bleed 2004   Multiple episodes of melena-? due to gastritis or adverse drug effect (nonsteroidals, small bowel ulceration with Fosamax); caused by Pepto-Bismol during one Emergency Department evaluation    Past Surgical History:  Procedure Laterality Date  . ABDOMINAL HYSTERECTOMY     emergency s/p delivery  . ABDOMINAL HYSTERECTOMY  1960   massive gynecologic bleeding  . BOWEL RESECTION     Resulting short gut syndrome  . CHOLECYSTECTOMY N/A 06/25/2018   Procedure: LAPAROSCOPIC CHOLECYSTECTOMY WITH INTRAOPERATIVE CHOLANGIOGRAM;  Surgeon: Armandina Gemma, MD;  Location: [facility];  Service: General;  Laterality: N/A;  . COLONOSCOPY W/ POLYPECTOMY  2005   Lipoma; diverticulosis  . COLONOSCOPY WITH ESOPHAGOGASTRODUODENOSCOPY (EGD)  11/22/2012   [facility]  . HIP ARTHROPLASTY Right 06/15/2020   Procedure: ANTERIOR ARTHROPLASTY BIPOLAR HIP (HEMIARTHROPLASTY);  Surgeon: Mcarthur Rossetti, MD;  Location: [facility];  Service: Orthopedics;  Laterality: Right;  . LAPAROSCOPIC LYSIS OF ADHESIONS  1965   s/p adhesions  . MASTECTOMY  right breast  . MASTECTOMY     Carcinoma of the breast; right  . UPPER GASTROINTESTINAL ENDOSCOPY      Current Medications: Outpatient Medications Prior to Visit    Medication Sig Dispense Refill  . acetaminophen (TYLENOL) 650 MG CR tablet Take 650 mg by mouth every 6 (six) hours.    . cyanocobalamin (,VITAMIN B-12,) 1000 MCG/ML injection Inject 1,000 mcg into the muscle every 30 (thirty) days.    . DULoxetine (CYMBALTA) 30 MG capsule Take 30 mg by mouth daily.    . DULoxetine (CYMBALTA) 60 MG capsule Take 60 mg by mouth daily.    . ergocalciferol (VITAMIN D2) 1.25 MG (50000 UT) capsule Take by mouth.    . levothyroxine (SYNTHROID) 75 MCG tablet Take 1 tablet (75 mcg total) by mouth daily before breakfast. 30 tablet 0  . metoprolol succinate (TOPROL-XL) 25 MG 24 hr tablet Take 1 tablet (25 mg total) by mouth daily. 30 tablet 0  . mirtazapine (REMERON) 15 MG tablet Take 15 mg by mouth at bedtime.    . Multiple Vitamins-Iron (MULTIVITAMINS WITH IRON) TABS tablet Take 1 tablet by mouth daily.    . NON FORMULARY Diet: _____ Regular, ___x___ NAS, _______Consistent Carbohydrate, _______NPO _____Other    . pantoprazole (PROTONIX) 40 MG tablet Take 1 tablet (40 mg total) by mouth 2 (two) times daily. (Patient taking differently: Take 40 mg by mouth daily. ) 60 tablet 0  . primidone (MYSOLINE) 50 MG tablet Take 1 tablet (50 mg total) by mouth at bedtime. 30 tablet 0  . sodium bicarbonate 650 MG tablet Take 650 mg by mouth 2 (two) times daily.    . traMADol (ULTRAM) 50 MG tablet Take 1 tablet (50 mg total) by mouth every 6 (six) hours. 60 tablet 0  . Cyanocobalamin (VITAMIN B-12 IJ) Inject as directed every 30 (thirty) days.    . DULoxetine (CYMBALTA) 30 MG capsule Take 2 capsules (60 mg total) by mouth daily. (Patient taking differently: Take 30 mg by mouth daily. ) 60 capsule 0  . ALPRAZolam (XANAX) 1 MG tablet Take 1 tablet (1 mg total) by mouth at bedtime. (Patient not taking: Reported on 10/17/2020) 30 tablet 0   No facility-administered medications prior to visit.     Allergies:   Codeine   Social History   Socioeconomic History  . Marital status:  Widowed    Spouse name: Not on file  . Number of children: Not on file  . Years of education: 20  . Highest education level: Not on file  Occupational History  . Not on file  Tobacco Use  . Smoking status: Former Smoker    Packs/day: 1.50    Years: 20.00    Pack years: 30.00    Types: Cigarettes  . Smokeless tobacco: Never Used  Vaping Use  . Vaping Use: Never used  Substance and Sexual Activity  . Alcohol use: No  . Drug use: No  . Sexual activity: Never  Other Topics Concern  . Not on file  Social History Narrative  . Not on file   Social Determinants of Health   Financial Resource Strain:   . Difficulty of Paying Living Expenses: Not on file  Food  Insecurity:   . Worried About Charity fundraiser in the Last Year: Not on file  . Ran Out of Food in the Last Year: Not on file  Transportation Needs:   . Lack of Transportation (Medical): Not on file  . Lack of Transportation (Non-Medical): Not on file  Physical Activity:   . Days of Exercise per Week: Not on file  . Minutes of Exercise per Session: Not on file  Stress:   . Feeling of Stress : Not on file  Social Connections:   . Frequency of Communication with Friends and Family: Not on file  . Frequency of Social Gatherings with Friends and Family: Not on file  . Attends Religious Services: Not on file  . Active Member of Clubs or Organizations: Not on file  . Attends Archivist Meetings: Not on file  . Marital Status: Not on file     Family History:  The patient's family history includes Anuerysm in her father; COPD in her sister; Cancer in an other family member; Healthy in her brother and sister; Rheum arthritis in her sister.   Review of Systems:   Please see the history of present illness.     General:  No chills, fever, night sweats or weight changes.   Cardiovascular:  No chest pain, dyspnea on exertion, edema, orthopnea, palpitations, paroxysmal nocturnal dyspnea. Dermatological: No rash,  lesions/masses Respiratory: No cough, dyspnea Urologic: No hematuria, dysuria Abdominal:   No nausea, vomiting, diarrhea, bright red blood per rectum, melena, or hematemesis Neurologic:  No visual changes, wkns, changes in mental status. All other systems reviewed and are otherwise negative except as noted above.   Physical Exam:    VS:  BP 132/82   Pulse 76   Ht 5\' 1"  (1.549 m)   Wt 115 lb (52.2 kg)   SpO2 98%   BMI 21.73 kg/m    General: Well developed, elderly female appearing in no acute distress. Head: Normocephalic, atraumatic. Neck: No carotid bruits. JVD not elevated.  Lungs: Respirations regular and unlabored, without wheezes or rales.  Heart: Regular rate and rhythm. No S3 or S4.  2/6 SEM along RUSB.  Abdomen: Appears non-distended. No obvious abdominal masses. Msk:  Strength and tone appear normal for age. No obvious joint deformities or effusions. Extremities: No clubbing or cyanosis. No lower extremity edema.  Distal pedal pulses are 2+ bilaterally. Neuro: Alert and oriented X 3. Moves all extremities spontaneously. No focal deficits noted. Psych:  Responds to questions appropriately with a normal affect. Skin: No rashes or lesions noted  Wt Readings from Last 3 Encounters:  10/17/20 115 lb (52.2 kg)  09/18/20 118 lb (53.5 kg)  08/30/20 113 lb (51.3 kg)     Studies/Labs Reviewed:   EKG:  EKG is not ordered today.   Recent Labs: 06/14/2020: TSH 1.312 06/25/2020: ALT 19 08/30/2020: BUN 26; Creatinine, Ser 1.41; Hemoglobin 10.3; Magnesium 1.9; Platelets 194; Potassium 4.2; Sodium 140   Lipid Panel No results found for: CHOL, TRIG, HDL, CHOLHDL, VLDL, LDLCALC, LDLDIRECT  Additional studies/ records that were reviewed today include:   Echocardiogram: 06/2020 IMPRESSIONS    1. Left ventricular ejection fraction, by estimation, is 60 to 65%. The  left ventricle has normal function. The left ventricle has no regional  wall motion abnormalities. There is mild  asymmetric left ventricular  hypertrophy of the basal-septal segment.  Left ventricular diastolic parameters are consistent with Grade I  diastolic dysfunction (impaired relaxation).  2. Right ventricular systolic function is  normal. The right ventricular  size is not well visualized. Tricuspid regurgitation signal is inadequate  for assessing PA pressure.  3. The mitral valve is normal in structure. Trivial mitral valve  regurgitation. No evidence of mitral stenosis.  4. The aortic valve is abnormal. Aortic valve regurgitation is not  visualized. Mild aortic valve stenosis. Aortic valve mean gradient  measures 12.0 mmHg.  5. The inferior vena cava is normal in size with <50% respiratory  variability, suggesting right atrial pressure of 8 mmHg.    Limited Echo: 08/2020 IMPRESSIONS    1. Left ventricular ejection fraction, by estimation, is 60 to 65%. The  left ventricle has normal function. There is moderate left ventricular  hypertrophy of the basal-septal segment.  2. Right ventricular systolic function is normal. The right ventricular  size is normal.  3. The aortic valve is tricuspid. There is moderate calcification of the  aortic valve. There is moderate thickening of the aortic valve. Aortic  valve regurgitation is not visualized. Mild to moderate aortic valve  stenosis. Aortic valve mean gradient  measures 11.7 mmHg. Aortic valve peak gradient measures 21.6 mmHg. Aortic  valve area, by VTI measures 1.36 cm.  4. The inferior vena cava is normal in size with greater than 50%  respiratory variability, suggesting right atrial pressure of 3 mmHg.  5. Limited echo to evaluate LV function and aortic stenosis    Event Monitor: 08/2020 [employer] monitor reviewed.  30 days analyzed.  Predominant rhythm is sinus with heart rate ranging from 54 bpm up to 127 bpm and average heart rate 72 bpm.  Episode of syncope reported on October 9 correlates with what appears to be lead  loss/artifact rather than ventricular fibrillation, particularly since there was presumably spontaneous improvement based on subsequent recordings showing sinus rhythm.  There is no onset or offset documented for review.   Assessment:    1. Paroxysmal atrial fibrillation (HCC)   2. Nonrheumatic aortic valve stenosis   3. Essential hypertension   4. Stage 3b chronic kidney disease ([place])      Plan:   In order of problems listed above:  1. Paroxysmal Atrial Fibrillation - She denies any recent palpitations and her event monitor last month showed normal sinus rhythm with no significant arrhythmias. She did NOT have a syncopal episode while wearing the monitor as outlined above. - Continue Toprol-XL 25 mg daily for rate control. - This patients CHA2DS2-VASc Score and unadjusted Ischemic Stroke Rate (% per year) is equal to 7.2 % stroke rate/year from a score of 5 (HTN, Aortic Plaque, Female, Age (2)). She is no longer on anticoagulation given recurrent GIB.  2. Aortic Stenosis - Her aortic stenosis was mild to moderate by most recent echocardiogram. Will continue to follow.  3. HTN - BP was initially recorded as being elevated to 158/80, rechecked and improved to 132/82. Continue current regimen with Toprol-XL 25 mg daily.  4. Stage 3-4 CKD - Creatinine was stable at 1.41 by recent labs in 08/2020.   Medication Adjustments/Labs and Tests Ordered: Current medicines are reviewed at length with the patient today.  Concerns regarding medicines are outlined above.  Medication changes, Labs and Tests ordered today are listed in the Patient Instructions below. Patient Instructions  Medication Instructions:  Your physician recommends that you continue on your current medications as directed. Please refer to the Current Medication list given to you today.  *If you need a refill on your cardiac medications before your next appointment, please call your pharmacy*  Lab Work: NONE   If you  have labs (blood work) drawn today and your tests are completely normal, you will receive your results only by: Marland Kitchen MyChart Message (if you have MyChart) OR . A paper copy in the mail If you have any lab test that is abnormal or we need to change your treatment, we will call you to review the results.   Testing/Procedures: NONE    Follow-Up: At [facility], you and your health needs are our priority.  As part of our continuing mission to provide you with exceptional heart care, we have created designated Provider Care Teams.  These Care Teams include your primary Cardiologist (physician) and Advanced Practice Providers (APPs -  Physician Assistants and Nurse Practitioners) who all work together to provide you with the care you need, when you need it.  We recommend signing up for the patient portal called "MyChart".  Sign up information is provided on this After Visit Summary.  MyChart is used to connect with patients for Virtual Visits (Telemedicine).  Patients are able to view lab/test results, encounter notes, upcoming appointments, etc.  Non-urgent messages can be sent to your provider as well.   To learn more about what you can do with MyChart, go to [email or web address].    Your next appointment:   1 year(s)  The format for your next appointment:   In Person  Provider:   Rozann Lesches, MD or Bernerd Pho, Jane Andrews   Other Instructions Thank you for choosing [facility]!     Signed, Jane Heritage, Jane Andrews  10/17/2020 5:41 PM    Jane S. [address] [place], [place] [address] Phone: [phone] Fax: [phone]

## 2020-11-01 DIAGNOSIS — R631 Polydipsia: Secondary | ICD-10-CM | POA: Diagnosis not present

## 2020-11-01 DIAGNOSIS — Z0001 Encounter for general adult medical examination with abnormal findings: Secondary | ICD-10-CM | POA: Diagnosis not present

## 2020-11-01 DIAGNOSIS — R7309 Other abnormal glucose: Secondary | ICD-10-CM | POA: Diagnosis not present

## 2020-11-01 DIAGNOSIS — Z1389 Encounter for screening for other disorder: Secondary | ICD-10-CM | POA: Diagnosis not present

## 2020-11-01 DIAGNOSIS — E7849 Other hyperlipidemia: Secondary | ICD-10-CM | POA: Diagnosis not present

## 2020-11-01 DIAGNOSIS — Z1331 Encounter for screening for depression: Secondary | ICD-10-CM | POA: Diagnosis not present

## 2020-11-01 DIAGNOSIS — Z681 Body mass index (BMI) 19 or less, adult: Secondary | ICD-10-CM | POA: Diagnosis not present

## 2020-11-01 DIAGNOSIS — K529 Noninfective gastroenteritis and colitis, unspecified: Secondary | ICD-10-CM | POA: Diagnosis not present

## 2020-11-01 DIAGNOSIS — H547 Unspecified visual loss: Secondary | ICD-10-CM | POA: Diagnosis not present

## 2020-11-01 DIAGNOSIS — I10 Essential (primary) hypertension: Secondary | ICD-10-CM | POA: Diagnosis not present

## 2020-12-05 DIAGNOSIS — E538 Deficiency of other specified B group vitamins: Secondary | ICD-10-CM | POA: Diagnosis not present

## 2020-12-11 DIAGNOSIS — N170 Acute kidney failure with tubular necrosis: Secondary | ICD-10-CM | POA: Diagnosis not present

## 2020-12-11 DIAGNOSIS — D638 Anemia in other chronic diseases classified elsewhere: Secondary | ICD-10-CM | POA: Diagnosis not present

## 2020-12-11 DIAGNOSIS — I129 Hypertensive chronic kidney disease with stage 1 through stage 4 chronic kidney disease, or unspecified chronic kidney disease: Secondary | ICD-10-CM | POA: Diagnosis not present

## 2020-12-11 DIAGNOSIS — N1832 Chronic kidney disease, stage 3b: Secondary | ICD-10-CM | POA: Diagnosis not present

## 2020-12-11 DIAGNOSIS — I5032 Chronic diastolic (congestive) heart failure: Secondary | ICD-10-CM | POA: Diagnosis not present

## 2020-12-18 ENCOUNTER — Telehealth: Payer: Self-pay | Admitting: Orthopaedic Surgery

## 2020-12-18 NOTE — Telephone Encounter (Signed)
Faxed to provided number  

## 2020-12-18 NOTE — Telephone Encounter (Signed)
Pt is at the dentist right now and they need something in writing faxed over about pre med. The fax number is [phone]

## 2020-12-21 DIAGNOSIS — I5032 Chronic diastolic (congestive) heart failure: Secondary | ICD-10-CM | POA: Diagnosis not present

## 2020-12-21 DIAGNOSIS — E875 Hyperkalemia: Secondary | ICD-10-CM | POA: Diagnosis not present

## 2020-12-21 DIAGNOSIS — N1832 Chronic kidney disease, stage 3b: Secondary | ICD-10-CM | POA: Diagnosis not present

## 2020-12-21 DIAGNOSIS — I129 Hypertensive chronic kidney disease with stage 1 through stage 4 chronic kidney disease, or unspecified chronic kidney disease: Secondary | ICD-10-CM | POA: Diagnosis not present

## 2020-12-21 DIAGNOSIS — D638 Anemia in other chronic diseases classified elsewhere: Secondary | ICD-10-CM | POA: Diagnosis not present

## 2020-12-21 DIAGNOSIS — E559 Vitamin D deficiency, unspecified: Secondary | ICD-10-CM | POA: Diagnosis not present

## 2020-12-24 ENCOUNTER — Ambulatory Visit: Payer: Medicare Other | Admitting: Orthopaedic Surgery

## 2020-12-31 ENCOUNTER — Ambulatory Visit: Payer: Medicare Other | Admitting: Orthopaedic Surgery

## 2021-01-14 DIAGNOSIS — D510 Vitamin B12 deficiency anemia due to intrinsic factor deficiency: Secondary | ICD-10-CM | POA: Diagnosis not present

## 2021-01-19 ENCOUNTER — Other Ambulatory Visit: Payer: Self-pay | Admitting: Adult Health

## 2021-02-14 DIAGNOSIS — E538 Deficiency of other specified B group vitamins: Secondary | ICD-10-CM | POA: Diagnosis not present

## 2021-02-28 DIAGNOSIS — C44319 Basal cell carcinoma of skin of other parts of face: Secondary | ICD-10-CM | POA: Diagnosis not present

## 2021-03-04 ENCOUNTER — Other Ambulatory Visit: Payer: Self-pay | Admitting: Internal Medicine

## 2021-03-04 ENCOUNTER — Other Ambulatory Visit (HOSPITAL_COMMUNITY): Payer: Self-pay | Admitting: Internal Medicine

## 2021-03-04 DIAGNOSIS — F339 Major depressive disorder, recurrent, unspecified: Secondary | ICD-10-CM | POA: Diagnosis not present

## 2021-03-04 DIAGNOSIS — I10 Essential (primary) hypertension: Secondary | ICD-10-CM | POA: Diagnosis not present

## 2021-03-04 DIAGNOSIS — N183 Chronic kidney disease, stage 3 unspecified: Secondary | ICD-10-CM | POA: Diagnosis not present

## 2021-03-04 DIAGNOSIS — D696 Thrombocytopenia, unspecified: Secondary | ICD-10-CM | POA: Diagnosis not present

## 2021-03-04 DIAGNOSIS — I4891 Unspecified atrial fibrillation: Secondary | ICD-10-CM | POA: Diagnosis not present

## 2021-03-04 DIAGNOSIS — H532 Diplopia: Secondary | ICD-10-CM | POA: Diagnosis not present

## 2021-03-04 DIAGNOSIS — Z681 Body mass index (BMI) 19 or less, adult: Secondary | ICD-10-CM | POA: Diagnosis not present

## 2021-03-04 DIAGNOSIS — I700 Atherosclerosis of aorta: Secondary | ICD-10-CM | POA: Diagnosis not present

## 2021-03-04 DIAGNOSIS — E063 Autoimmune thyroiditis: Secondary | ICD-10-CM | POA: Diagnosis not present

## 2021-03-04 DIAGNOSIS — C50919 Malignant neoplasm of unspecified site of unspecified female breast: Secondary | ICD-10-CM | POA: Diagnosis not present

## 2021-03-05 ENCOUNTER — Ambulatory Visit (INDEPENDENT_AMBULATORY_CARE_PROVIDER_SITE_OTHER): Payer: Medicare Other | Admitting: Internal Medicine

## 2021-03-05 ENCOUNTER — Encounter (INDEPENDENT_AMBULATORY_CARE_PROVIDER_SITE_OTHER): Payer: Self-pay | Admitting: Internal Medicine

## 2021-03-05 ENCOUNTER — Other Ambulatory Visit: Payer: Self-pay

## 2021-03-05 DIAGNOSIS — R197 Diarrhea, unspecified: Secondary | ICD-10-CM | POA: Diagnosis not present

## 2021-03-05 NOTE — Progress Notes (Signed)
Presenting complaint;  Diarrhea.  Database and subjective:  Patient is 87 year old Caucasian female who is here for scheduled visit.  She was last seen 1 year ago.  She has history of chronic GERD iron deficiency anemia as well as history of chronic diarrhea resulting from small bowel resection over 20 years ago and she also has been treated for small intestinal bacterial overgrowth.  However she gradually adapted and was doing well when she was seen 1 year ago. She says she she has had diarrhea since she had right hip surgery for fracture resulting from fall in July 2021.  She has an average of 4 stools per day and all of her stools are loose.  She was evaluated by Dr. Riley Kill and has taken Lomotil up to 4 tablets a day but she is using it on as-needed basis.  Her appetite is normal but she has lost 8 pounds since her last visit.  She says her weight had dropped 210 pounds 2 months ago but it has come up since then.  She feels it is because she is eating sweets. She denies abdominal pain melena rectal bleeding or oily stools.  She says her stools are not foul-smelling either like she used to have before.  Most of her bowel movements occur by lunchtime.  She rarely has nocturnal bowel movement.  She drinks city water and she has not taken any antibiotics recently. She is having 2-3 accidents per month. She takes Ultram for back pain and no more than 2 a day. She states she has been on same dose of Synthroid for several months. Patient also complains of diplopia.  She is scheduled for MRI next month. She lives alone.  Her sister age 39 died few days ago. She has a brother who lives in [place] and another sister who lives in [place].  Her niece lives close by and is very helpful.   Current Medications: Outpatient Encounter Medications as of 03/05/2021  Medication Sig  . acetaminophen (TYLENOL) 650 MG CR tablet Take 650 mg by mouth every 6 (six) hours.  . cyanocobalamin (,VITAMIN B-12,) 1000  MCG/ML injection Inject 1,000 mcg into the muscle every 30 (thirty) days.  . diphenoxylate-atropine (LOMOTIL) 2.5-0.025 MG tablet Take 1 tablet by mouth 4 (four) times daily as needed for diarrhea or loose stools.  . DULoxetine (CYMBALTA) 30 MG capsule Take 30 mg by mouth daily.  . DULoxetine (CYMBALTA) 60 MG capsule Take 60 mg by mouth daily.  Marland Kitchen levothyroxine (SYNTHROID) 75 MCG tablet Take 1 tablet (75 mcg total) by mouth daily before breakfast.  . metoprolol succinate (TOPROL-XL) 25 MG 24 hr tablet Take 1 tablet (25 mg total) by mouth daily.  . mirtazapine (REMERON) 15 MG tablet Take 15 mg by mouth at bedtime.  . Multiple Vitamins-Iron (MULTIVITAMINS WITH IRON) TABS tablet Take 1 tablet by mouth daily.  . pantoprazole (PROTONIX) 40 MG tablet Take 1 tablet (40 mg total) by mouth 2 (two) times daily. (Patient taking differently: Take 40 mg by mouth daily.)  . sodium bicarbonate 650 MG tablet Take 650 mg by mouth 2 (two) times daily.  . traMADol (ULTRAM) 50 MG tablet Take 1 tablet (50 mg total) by mouth every 6 (six) hours.  Marland Kitchen VITAMIN D PO Take by mouth daily.  . [DISCONTINUED] ergocalciferol (VITAMIN D2) 1.25 MG (50000 UT) capsule Take by mouth. (Patient not taking: Reported on 03/05/2021)  . [DISCONTINUED] NON FORMULARY Diet: _____ Regular, ___x___ NAS, _______Consistent Carbohydrate, _______NPO _____Other (Patient not taking: Reported  on 03/05/2021)  . [DISCONTINUED] primidone (MYSOLINE) 50 MG tablet Take 1 tablet (50 mg total) by mouth at bedtime. (Patient not taking: Reported on 03/05/2021)   No facility-administered encounter medications on file as of 03/05/2021.   Past Medical History:  Diagnosis Date  . Abdominal adhesions 1994  . Allergic rhinitis   . Anemia   . Anxiety and depression   . Aortic stenosis   . Arthritis   . Atrial fibrillation ([place]) 10/2012   Associated with severe anemia and esophageal pill impaction  . Breast carcinoma (HCC)    Right mastectomy  . Cholelithiasis    . Essential hypertension   . Gastroesophageal reflux disease    Hiatal hernia  . History of blood transfusion   . Hypothyroidism   . Low back pain   . 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 Nephrolithiasis 2004   Painless hematuria  . Short gut syndrome    Bowel resection , 2004  . Upper GI bleed 2004   Multiple episodes of melena-? due to gastritis or adverse drug effect (nonsteroidals, small bowel ulceration with Fosamax); caused by Pepto-Bismol during one Emergency Department evaluation   Past Surgical History:  Procedure Laterality Date  . ABDOMINAL HYSTERECTOMY     emergency s/p delivery  . ABDOMINAL HYSTERECTOMY  1960   massive gynecologic bleeding  . BOWEL RESECTION     Resulting short gut syndrome  . CHOLECYSTECTOMY N/A 06/25/2018   Procedure: LAPAROSCOPIC CHOLECYSTECTOMY WITH INTRAOPERATIVE CHOLANGIOGRAM;  Surgeon: Armandina Gemma, MD;  Location: [facility];  Service: General;  Laterality: N/A;  . COLONOSCOPY W/ POLYPECTOMY  2005   Lipoma; diverticulosis  . COLONOSCOPY WITH ESOPHAGOGASTRODUODENOSCOPY (EGD)  11/22/2012   Dorsey Authement  . HIP ARTHROPLASTY Right 06/15/2020   Procedure: ANTERIOR ARTHROPLASTY BIPOLAR HIP (HEMIARTHROPLASTY);  Surgeon: Mcarthur Rossetti, MD;  Location: [facility];  Service: Orthopedics;  Laterality: Right;  . LAPAROSCOPIC LYSIS OF ADHESIONS  1965   s/p adhesions  . MASTECTOMY  right breast  . MASTECTOMY     Carcinoma of the breast; right  . UPPER GASTROINTESTINAL ENDOSCOPY        Objective: Blood pressure 136/71, pulse 79, temperature 98.3 F (36.8 C), temperature source Oral, height '5\' 3"'  (1.6 m), weight 116 lb (52.6 kg). Patient is alert and in no acute distress. She is able to move from chair to examination table without any help.  She is slow and careful though. Conjunctiva is pink. Sclera is nonicteric Oropharyngeal mucosa is normal. No neck masses or thyromegaly noted. Cardiac exam with regular  rhythm normal S1 and S2. No murmur or gallop noted. Lungs are clear to auscultation. Abdomen is flat.  She has long midline scar.  Bowel sounds are normal.  On palpation abdomen is soft and nontender with organomegaly or masses.  Percussion note is normal. No LE edema or clubbing noted.  Labs/studies Results:   CBC Latest Ref Rng & Units 08/30/2020 06/25/2020 06/19/2020  WBC 4.0 - 10.5 K/uL 8.5 7.7 -  Hemoglobin 12.0 - 15.0 g/dL 10.3(L) 9.2(L) 10.1(L)  Hematocrit 36.0 - 46.0 % 33.0(L) 28.7(L) 30.8(L)  Platelets 150 - 400 K/uL 194 249 -    CMP Latest Ref Rng & Units 08/30/2020 06/25/2020 06/19/2020  Glucose 70 - 99 mg/dL 126(H) 94 103(H)  BUN 8 - 23 mg/dL 26(H) 38(H) 28(H)  Creatinine 0.44 - 1.00 mg/dL 1.41(H) 1.49(H) 1.66(H)  Sodium 135 - 145 mmol/L 140 137 139  Potassium 3.5 - 5.1 mmol/L 4.2  4.2 3.8  Chloride 98 - 111 mmol/L 109 106 110  CO2 22 - 32 mmol/L 20(L) 22 19(L)  Calcium 8.9 - 10.3 mg/dL 9.8 8.9 7.4(L)  Total Protein 6.5 - 8.1 g/dL - 6.1(L) 5.0(L)  Total Bilirubin 0.3 - 1.2 mg/dL - 0.6 0.5  Alkaline Phos 38 - 126 U/L - 89 52  AST 15 - 41 U/L - 21 19  ALT 0 - 44 U/L - 19 12    Hepatic Function Latest Ref Rng & Units 06/25/2020 06/19/2020 06/18/2020  Total Protein 6.5 - 8.1 g/dL 6.1(L) 5.0(L) 5.0(L)  Albumin 3.5 - 5.0 g/dL 2.9(L) 2.3(L) 2.4(L)  AST 15 - 41 U/L '21 19 20  ' ALT 0 - 44 U/L '19 12 12  ' Alk Phosphatase 38 - 126 U/L 89 52 50  Total Bilirubin 0.3 - 1.2 mg/dL 0.6 0.5 0.5  Bilirubin, Direct 0.0 - 0.2 mg/dL - - -    No recent lab available for review.  Assessment:  #1.  Diarrhea.  She has several year history of diarrhea secondary to small bowel resection and small intestinal bacterial overgrowth with she gradually adapted and was not having an issue until her hip surgery in July 2021.  Her symptoms do not suggest malabsorption or intestinal bacterial overgrowth.  She could have microscopic or collagenous colitis.  But will first rule out infection before considering  sigmoidoscopy.  #2.  History of iron deficiency anemia.  No recent lab data on file.  Will request copy from Dr. Delice Bison office.   Plan:  Patient will take stool sample to the lab for GI pathogen panel. Patient advised to take diphenoxylate by mouth daily before breakfast and take second dose on as-needed basis. Further recommendations to follow. Office visit in 2 months.

## 2021-03-05 NOTE — Patient Instructions (Signed)
Physician will call with results of stool test when completed. Take one lomotil tablet before breakfast daily and 2nd dose as needed

## 2021-03-08 DIAGNOSIS — R197 Diarrhea, unspecified: Secondary | ICD-10-CM | POA: Diagnosis not present

## 2021-03-11 LAB — GASTROINTESTINAL PATHOGEN PANEL PCR
C. difficile Tox A/B, PCR: NOT DETECTED
Campylobacter, PCR: NOT DETECTED
Cryptosporidium, PCR: NOT DETECTED
E coli (ETEC) LT/ST PCR: NOT DETECTED
E coli (STEC) stx1/stx2, PCR: NOT DETECTED
E coli 0157, PCR: NOT DETECTED
Giardia lamblia, PCR: NOT DETECTED
Norovirus, PCR: NOT DETECTED
Rotavirus A, PCR: NOT DETECTED
Salmonella, PCR: NOT DETECTED
Shigella, PCR: NOT DETECTED

## 2021-03-14 ENCOUNTER — Other Ambulatory Visit (INDEPENDENT_AMBULATORY_CARE_PROVIDER_SITE_OTHER): Payer: Self-pay | Admitting: *Deleted

## 2021-03-14 DIAGNOSIS — R197 Diarrhea, unspecified: Secondary | ICD-10-CM

## 2021-03-15 DIAGNOSIS — I129 Hypertensive chronic kidney disease with stage 1 through stage 4 chronic kidney disease, or unspecified chronic kidney disease: Secondary | ICD-10-CM | POA: Diagnosis not present

## 2021-03-15 DIAGNOSIS — I5032 Chronic diastolic (congestive) heart failure: Secondary | ICD-10-CM | POA: Diagnosis not present

## 2021-03-15 DIAGNOSIS — N1832 Chronic kidney disease, stage 3b: Secondary | ICD-10-CM | POA: Diagnosis not present

## 2021-03-15 DIAGNOSIS — D638 Anemia in other chronic diseases classified elsewhere: Secondary | ICD-10-CM | POA: Diagnosis not present

## 2021-03-15 DIAGNOSIS — E875 Hyperkalemia: Secondary | ICD-10-CM | POA: Diagnosis not present

## 2021-03-18 ENCOUNTER — Ambulatory Visit (HOSPITAL_COMMUNITY): Payer: Medicare Other

## 2021-03-18 ENCOUNTER — Encounter (HOSPITAL_COMMUNITY): Payer: Self-pay

## 2021-03-22 DIAGNOSIS — I5032 Chronic diastolic (congestive) heart failure: Secondary | ICD-10-CM | POA: Diagnosis not present

## 2021-03-22 DIAGNOSIS — N1832 Chronic kidney disease, stage 3b: Secondary | ICD-10-CM | POA: Diagnosis not present

## 2021-03-22 DIAGNOSIS — D638 Anemia in other chronic diseases classified elsewhere: Secondary | ICD-10-CM | POA: Diagnosis not present

## 2021-03-22 DIAGNOSIS — I129 Hypertensive chronic kidney disease with stage 1 through stage 4 chronic kidney disease, or unspecified chronic kidney disease: Secondary | ICD-10-CM | POA: Diagnosis not present

## 2021-03-22 DIAGNOSIS — D510 Vitamin B12 deficiency anemia due to intrinsic factor deficiency: Secondary | ICD-10-CM | POA: Diagnosis not present

## 2021-03-27 ENCOUNTER — Ambulatory Visit (HOSPITAL_COMMUNITY)
Admission: RE | Admit: 2021-03-27 | Discharge: 2021-03-27 | Disposition: A | Discharge status: Home / Self Care | Payer: Medicare Other | Source: Ambulatory Visit | Attending: Internal Medicine | Admitting: Internal Medicine

## 2021-03-27 ENCOUNTER — Other Ambulatory Visit: Payer: Self-pay

## 2021-03-27 DIAGNOSIS — H532 Diplopia: Secondary | ICD-10-CM | POA: Diagnosis present

## 2021-04-23 ENCOUNTER — Encounter (INDEPENDENT_AMBULATORY_CARE_PROVIDER_SITE_OTHER): Payer: Self-pay

## 2021-05-01 DIAGNOSIS — E538 Deficiency of other specified B group vitamins: Secondary | ICD-10-CM | POA: Diagnosis not present

## 2021-05-09 ENCOUNTER — Ambulatory Visit (INDEPENDENT_AMBULATORY_CARE_PROVIDER_SITE_OTHER): Payer: Medicare Other | Admitting: Internal Medicine

## 2021-05-09 DIAGNOSIS — H43813 Vitreous degeneration, bilateral: Secondary | ICD-10-CM | POA: Diagnosis not present

## 2021-05-29 DIAGNOSIS — D510 Vitamin B12 deficiency anemia due to intrinsic factor deficiency: Secondary | ICD-10-CM | POA: Diagnosis not present

## 2021-06-11 DIAGNOSIS — H5000 Unspecified esotropia: Secondary | ICD-10-CM | POA: Diagnosis not present

## 2021-07-16 DIAGNOSIS — H18593 Other hereditary corneal dystrophies, bilateral: Secondary | ICD-10-CM | POA: Diagnosis not present

## 2021-07-16 DIAGNOSIS — Z961 Presence of intraocular lens: Secondary | ICD-10-CM | POA: Diagnosis not present

## 2021-07-16 DIAGNOSIS — H26493 Other secondary cataract, bilateral: Secondary | ICD-10-CM | POA: Diagnosis not present

## 2021-07-16 DIAGNOSIS — H353132 Nonexudative age-related macular degeneration, bilateral, intermediate dry stage: Secondary | ICD-10-CM | POA: Diagnosis not present

## 2021-07-23 DIAGNOSIS — M6530 Trigger finger, unspecified finger: Secondary | ICD-10-CM | POA: Diagnosis not present

## 2021-07-23 DIAGNOSIS — Z1389 Encounter for screening for other disorder: Secondary | ICD-10-CM | POA: Diagnosis not present

## 2021-07-23 DIAGNOSIS — H532 Diplopia: Secondary | ICD-10-CM | POA: Diagnosis not present

## 2021-07-23 DIAGNOSIS — H4921 Sixth [abducent] nerve palsy, right eye: Secondary | ICD-10-CM | POA: Diagnosis not present

## 2021-07-23 DIAGNOSIS — M159 Polyosteoarthritis, unspecified: Secondary | ICD-10-CM | POA: Diagnosis not present

## 2021-07-23 DIAGNOSIS — Z1331 Encounter for screening for depression: Secondary | ICD-10-CM | POA: Diagnosis not present

## 2021-07-23 DIAGNOSIS — I10 Essential (primary) hypertension: Secondary | ICD-10-CM | POA: Diagnosis not present

## 2021-07-23 DIAGNOSIS — N183 Chronic kidney disease, stage 3 unspecified: Secondary | ICD-10-CM | POA: Diagnosis not present

## 2021-07-23 DIAGNOSIS — E039 Hypothyroidism, unspecified: Secondary | ICD-10-CM | POA: Diagnosis not present

## 2021-07-23 DIAGNOSIS — M1991 Primary osteoarthritis, unspecified site: Secondary | ICD-10-CM | POA: Diagnosis not present

## 2021-07-23 DIAGNOSIS — Z6820 Body mass index (BMI) 20.0-20.9, adult: Secondary | ICD-10-CM | POA: Diagnosis not present

## 2021-08-16 DIAGNOSIS — D519 Vitamin B12 deficiency anemia, unspecified: Secondary | ICD-10-CM | POA: Diagnosis not present

## 2021-08-17 ENCOUNTER — Other Ambulatory Visit: Payer: Self-pay

## 2021-08-17 ENCOUNTER — Encounter: Payer: Self-pay | Admitting: Emergency Medicine

## 2021-08-17 ENCOUNTER — Ambulatory Visit
Admission: EM | Admit: 2021-08-17 | Discharge: 2021-08-17 | Disposition: A | Discharge status: Home / Self Care | Payer: Medicare Other | Attending: Internal Medicine | Admitting: Internal Medicine

## 2021-08-17 DIAGNOSIS — M545 Low back pain, unspecified: Secondary | ICD-10-CM

## 2021-08-17 MED ORDER — DICLOFENAC SODIUM 1 % EX GEL: 4.0000 g | Freq: Four times a day (QID) | CUTANEOUS | 0 refills | Status: DC

## 2021-08-17 MED ORDER — IBUPROFEN 600 MG PO TABS: 600.0000 mg | ORAL_TABLET | Freq: Four times a day (QID) | ORAL | 0 refills | Status: DC | PRN

## 2021-08-17 NOTE — Discharge Instructions (Addendum)
Gentle range of motion exercises Use medications as prescribed Heating pad use on a 15-minute on-15 minutes off cycle If pain worsens please return to urgent care to be reevaluated.

## 2021-08-17 NOTE — ED Triage Notes (Signed)
Patient c/o RT hip pain that started 2 days ago.   Patient denies fall or trauma.   Patient endorses increased pain with ambulation, "when I try to get up and when I first sit down".   History of Scoliosis.   History of RT hip fracture 2021.   Patient took oxycodone, ointments, and hydrocodone w/ no relief of symptoms.

## 2021-08-17 NOTE — ED Provider Notes (Signed)
[facility]    CSN: [id number] Arrival date & time: 08/17/21  1039      History   Chief Complaint Chief Complaint  Patient presents with   Hip Pain    HPI Jane Andrews is a 87 y.o. female comes to urgent care with right sided lower back pain of 2 days duration.  Patient denies any fall or trauma to the lower back.  She admits current some heavy items.  She says the pain is worse when she tries to get up and sit or when she stands for a long time.  Pain is relieved when she sits.  No radiation of pain.  She tried oxycodone and hydrocodone with no relief.  No nausea or vomiting.  No swelling or Muscle stiffness. No numbness or tingling.   HPI  Past Medical History:  Diagnosis Date   Abdominal adhesions 1994   Allergic rhinitis    Anemia    Anxiety and depression    Aortic stenosis    Arthritis    Atrial fibrillation ([place]) 10/2012   Associated with severe anemia and esophageal pill impaction   Breast carcinoma ([place])    Right mastectomy   Cholelithiasis    Essential hypertension    Gastroesophageal reflux disease    Hiatal hernia   History of blood transfusion    Hypothyroidism    Low back pain    Malabsorption    Short gut syndrome following small bowel resection surgery x2   Nephrolithiasis 2004   Painless hematuria   Short gut syndrome    Bowel resection , 2004   Upper GI bleed 2004   Multiple episodes of melena-? due to gastritis or adverse drug effect (nonsteroidals, small bowel ulceration with Fosamax); caused by Pepto-Bismol during one Emergency Department evaluation    Patient Active Problem List   Diagnosis Date Noted   Diarrhea 03/05/2021   Scoliosis 06/26/2020   Thrombocytopenia ([place]) 06/22/2020   Aspiration pneumonia ([place]) 06/22/2020   Chronic constipation 06/21/2020   Post-menopausal osteoporosis 06/21/2020   Essential tremor 06/21/2020   Vitamin B 12 deficiency 06/21/2020   Major depression, recurrent, chronic ([place]) 06/21/2020    Status post total replacement of right hip    AKI (acute kidney injury) ([place])    Postoperative anemia due to acute blood loss    Closed right hip fracture ([place]) 06/14/2020   IDA (iron deficiency anemia) 02/28/2020   Atrial fibrillation with RVR ([place]) 12/25/2018   GERD (gastroesophageal reflux disease) 12/25/2018   Chronic cholecystitis s/p lap cholecystectomy 06/25/2018 06/18/2018   Vagal reaction 04/12/2016   Abdominal pain 09/19/2014   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   Ecchymoses, spontaneous 05/31/2013   Hypothyroid    Atrial fibrillation (HCC)    Breast carcinoma (HCC)    Malabsorption    CKD (chronic kidney disease) stage 3, GFR 30-59 ml/min ([place]) 10/21/2012   Anemia, normocytic normochromic 07/06/2012   Chronic diarrhea 02/10/2012   Hypertension 02/10/2012    Past Surgical History:  Procedure Laterality Date   ABDOMINAL HYSTERECTOMY     emergency s/p delivery   [place]   massive gynecologic bleeding   BOWEL RESECTION     Resulting short gut syndrome   CHOLECYSTECTOMY N/A 06/25/2018   Procedure: LAPAROSCOPIC CHOLECYSTECTOMY WITH INTRAOPERATIVE CHOLANGIOGRAM;  Surgeon: Armandina Gemma, MD;  Location: [facility];  Service: General;  Laterality: N/A;   COLONOSCOPY W/ POLYPECTOMY  2005   Lipoma; diverticulosis   COLONOSCOPY WITH ESOPHAGOGASTRODUODENOSCOPY (EGD)  11/22/2012   [place]  HIP ARTHROPLASTY Right 06/15/2020   Procedure: ANTERIOR ARTHROPLASTY BIPOLAR HIP (HEMIARTHROPLASTY);  Surgeon: Mcarthur Rossetti, MD;  Location: [facility];  Service: Orthopedics;  Laterality: Right;   LAPAROSCOPIC LYSIS OF ADHESIONS  1965   s/p adhesions   MASTECTOMY  right breast   MASTECTOMY     Carcinoma of the breast; right   UPPER GASTROINTESTINAL ENDOSCOPY      OB History     Gravida      Para      Term      Preterm      AB      Living  0      SAB      IAB      Ectopic      Multiple      Live Births               Home  Medications    Prior to Admission medications   Medication Sig Start Date End Date Taking? Authorizing Provider  diclofenac Sodium (VOLTAREN) 1 % GEL Apply 4 g topically 4 (four) times daily. 08/17/21  Yes Devany Aja, Myrene Galas, MD  DULoxetine (CYMBALTA) 30 MG capsule Take 30 mg by mouth daily.   Yes [provider]  DULoxetine (CYMBALTA) 60 MG capsule Take 60 mg by mouth daily. 07/28/20  Yes [provider]  ibuprofen (ADVIL) 600 MG tablet Take 1 tablet (600 mg total) by mouth every 6 (six) hours as needed. 08/17/21  Yes Rashied Corallo, Myrene Galas, MD  levothyroxine (SYNTHROID) 75 MCG tablet Take 1 tablet (75 mcg total) by mouth daily before breakfast. 07/09/20  Yes Gerlene Fee, NP  Multiple Vitamins-Iron (MULTIVITAMINS WITH IRON) TABS tablet Take 1 tablet by mouth daily. 06/19/20  Yes [provider]  traMADol (ULTRAM) 50 MG tablet Take 1 tablet (50 mg total) by mouth every 6 (six) hours. 07/09/20  Yes Gerlene Fee, NP  acetaminophen (TYLENOL) 650 MG CR tablet Take 650 mg by mouth every 6 (six) hours. 06/28/20   [provider]  Bacillus Coagulans-Inulin (PROBIOTIC FORMULA PO) Take 1 tablet by mouth daily.    [provider]  cyanocobalamin (,VITAMIN B-12,) 1000 MCG/ML injection Inject 1,000 mcg into the muscle every 30 (thirty) days. 07/16/20   [provider]  diphenoxylate-atropine (LOMOTIL) 2.5-0.025 MG tablet Take 1 tablet by mouth 4 (four) times daily as needed for diarrhea or loose stools.    [provider]  metoprolol succinate (TOPROL-XL) 25 MG 24 hr tablet Take 1 tablet (25 mg total) by mouth daily. 07/09/20   Gerlene Fee, NP  mirtazapine (REMERON) 15 MG tablet Take 15 mg by mouth at bedtime. 10/12/20   [provider]  pantoprazole (PROTONIX) 40 MG tablet Take 1 tablet (40 mg total) by mouth 2 (two) times daily. Patient taking differently: Take 40 mg by mouth daily. 07/09/20   Gerlene Fee, NP  sodium bicarbonate 650 MG  tablet Take 650 mg by mouth 2 (two) times daily. 09/20/20   [provider]  VITAMIN D PO Take by mouth daily.    [provider]    Family History Family History  Problem Relation Age of Onset   Anuerysm Father    Rheum arthritis Sister    Healthy Sister    COPD Sister    Healthy Brother    Cancer Other    Colon cancer Neg Hx     Social History Social History   Tobacco Use   Smoking status: Former  Packs/day: 1.50    Years: 20.00    Pack years: 30.00    Types: Cigarettes   Smokeless tobacco: Never  Vaping Use   Vaping Use: Never used  Substance Use Topics   Alcohol use: No   Drug use: No     Allergies   Codeine   Review of Systems Review of Systems  Gastrointestinal: Negative.   Musculoskeletal:  Positive for arthralgias and back pain. Negative for joint swelling, neck pain and neck stiffness.  Neurological:  Negative for syncope, weakness and numbness.    Physical Exam Triage Vital Signs ED Triage Vitals  Enc Vitals Group     BP 08/17/21 1051 120/78     Pulse Rate 08/17/21 1051 (!) 115     Resp 08/17/21 1051 17     Temp 08/17/21 1051 98.5 F (36.9 C)     Temp Source 08/17/21 1051 Oral     SpO2 08/17/21 1051 94 %     Weight --      Height --      Head Circumference --      Peak Flow --      Pain Score 08/17/21 1049 7     Pain Loc --      Pain Edu? --      Excl. in [place]? --    No data found.  Updated Vital Signs BP 120/78 (BP Location: Right Arm)   Pulse (!) 115   Temp 98.5 F (36.9 C) (Oral)   Resp 17   SpO2 94%   Visual Acuity Right Eye Distance:   Left Eye Distance:   Bilateral Distance:    Right Eye Near:   Left Eye Near:    Bilateral Near:     Physical Exam Vitals and nursing note reviewed.  Constitutional:      Appearance: Normal appearance.  Cardiovascular:     Rate and Rhythm: Normal rate and regular rhythm.  Musculoskeletal:        General: Normal range of motion.     Comments: Patient has some  discomfort with flexion and extension of the LS spine.  Skin:    Capillary Refill: Capillary refill takes less than 2 seconds.  Neurological:     General: No focal deficit present.     Mental Status: She is alert and oriented to person, place, and time.     UC Treatments / Results  Labs (all labs ordered are listed, but only abnormal results are displayed) Labs Reviewed - No data to display  EKG   Radiology No results found.  Procedures Procedures (including critical care time)  Medications Ordered in UC Medications - No data to display  Initial Impression / Assessment and Plan / UC Course  I have reviewed the triage vital signs and the nursing notes.  Pertinent labs & imaging results that were available during my care of the patient were reviewed by me and considered in my medical decision making (see chart for details).     Low back pain: Voltaren gel Ibuprofen as needed for back pain Gentle range of motion exercises Heating pad use will be helpful Return precautions given No indication for x-rays at this time. Final Clinical Impressions(s) / UC Diagnoses   Final diagnoses:  Low back pain, unspecified back pain laterality, unspecified chronicity, unspecified whether sciatica present     Discharge Instructions      Gentle range of motion exercises Use medications as prescribed Heating pad use on a 15-minute on-15 minutes off cycle If  pain worsens please return to urgent care to be reevaluated.   ED Prescriptions     Medication Sig Dispense Auth. Provider   diclofenac Sodium (VOLTAREN) 1 % GEL Apply 4 g topically 4 (four) times daily. 350 g Chase Picket, MD   ibuprofen (ADVIL) 600 MG tablet Take 1 tablet (600 mg total) by mouth every 6 (six) hours as needed. 30 tablet Logyn Kendrick, Myrene Galas, MD      PDMP not reviewed this encounter.   Chase Picket, MD 08/17/21 [phone]

## 2021-08-29 DIAGNOSIS — C44319 Basal cell carcinoma of skin of other parts of face: Secondary | ICD-10-CM | POA: Diagnosis not present

## 2021-09-20 DIAGNOSIS — E538 Deficiency of other specified B group vitamins: Secondary | ICD-10-CM | POA: Diagnosis not present

## 2021-10-03 DIAGNOSIS — H18593 Other hereditary corneal dystrophies, bilateral: Secondary | ICD-10-CM | POA: Diagnosis not present

## 2021-10-03 DIAGNOSIS — H0288A Meibomian gland dysfunction right eye, upper and lower eyelids: Secondary | ICD-10-CM | POA: Diagnosis not present

## 2021-10-03 DIAGNOSIS — H04123 Dry eye syndrome of bilateral lacrimal glands: Secondary | ICD-10-CM | POA: Diagnosis not present

## 2021-10-03 DIAGNOSIS — H353132 Nonexudative age-related macular degeneration, bilateral, intermediate dry stage: Secondary | ICD-10-CM | POA: Diagnosis not present

## 2021-10-25 DIAGNOSIS — E063 Autoimmune thyroiditis: Secondary | ICD-10-CM | POA: Diagnosis not present

## 2021-10-25 DIAGNOSIS — M1991 Primary osteoarthritis, unspecified site: Secondary | ICD-10-CM | POA: Diagnosis not present

## 2021-10-25 DIAGNOSIS — I10 Essential (primary) hypertension: Secondary | ICD-10-CM | POA: Diagnosis not present

## 2021-10-25 DIAGNOSIS — Z13220 Encounter for screening for lipoid disorders: Secondary | ICD-10-CM | POA: Diagnosis not present

## 2021-10-25 DIAGNOSIS — I4891 Unspecified atrial fibrillation: Secondary | ICD-10-CM | POA: Diagnosis not present

## 2021-10-25 DIAGNOSIS — E538 Deficiency of other specified B group vitamins: Secondary | ICD-10-CM | POA: Diagnosis not present

## 2021-10-25 DIAGNOSIS — N764 Abscess of vulva: Secondary | ICD-10-CM | POA: Diagnosis not present

## 2021-10-25 DIAGNOSIS — Z6821 Body mass index (BMI) 21.0-21.9, adult: Secondary | ICD-10-CM | POA: Diagnosis not present

## 2021-10-27 ENCOUNTER — Emergency Department (HOSPITAL_COMMUNITY)
Admission: EM | Admit: 2021-10-27 | Discharge: 2021-10-27 | Disposition: A | Discharge status: Home / Self Care | Payer: Medicare Other | Attending: Emergency Medicine | Admitting: Emergency Medicine

## 2021-10-27 ENCOUNTER — Encounter (HOSPITAL_COMMUNITY): Payer: Self-pay | Admitting: Emergency Medicine

## 2021-10-27 ENCOUNTER — Other Ambulatory Visit: Payer: Self-pay

## 2021-10-27 DIAGNOSIS — N94819 Vulvodynia, unspecified: Secondary | ICD-10-CM | POA: Diagnosis not present

## 2021-10-27 DIAGNOSIS — N760 Acute vaginitis: Secondary | ICD-10-CM

## 2021-10-27 DIAGNOSIS — Z79899 Other long term (current) drug therapy: Secondary | ICD-10-CM | POA: Insufficient documentation

## 2021-10-27 DIAGNOSIS — N183 Chronic kidney disease, stage 3 unspecified: Secondary | ICD-10-CM | POA: Insufficient documentation

## 2021-10-27 DIAGNOSIS — E039 Hypothyroidism, unspecified: Secondary | ICD-10-CM | POA: Diagnosis not present

## 2021-10-27 DIAGNOSIS — Z87891 Personal history of nicotine dependence: Secondary | ICD-10-CM | POA: Insufficient documentation

## 2021-10-27 DIAGNOSIS — I129 Hypertensive chronic kidney disease with stage 1 through stage 4 chronic kidney disease, or unspecified chronic kidney disease: Secondary | ICD-10-CM | POA: Insufficient documentation

## 2021-10-27 DIAGNOSIS — N771 Vaginitis, vulvitis and vulvovaginitis in diseases classified elsewhere: Secondary | ICD-10-CM | POA: Diagnosis not present

## 2021-10-27 LAB — WET PREP, GENITAL
Clue Cells Wet Prep HPF POC: NONE SEEN
Sperm: NONE SEEN
Trich, Wet Prep: NONE SEEN
Yeast Wet Prep HPF POC: NONE SEEN

## 2021-10-27 LAB — URINALYSIS, ROUTINE W REFLEX MICROSCOPIC
Bilirubin Urine: NEGATIVE
Glucose, UA: NEGATIVE mg/dL
Ketones, ur: NEGATIVE mg/dL
Nitrite: NEGATIVE
Protein, ur: NEGATIVE mg/dL
Specific Gravity, Urine: 1.005 (ref 1.005–1.030)
pH: 6 (ref 5.0–8.0)

## 2021-10-27 MED ORDER — PREMARIN 0.625 MG/GM VA CREA: 1.0000 | TOPICAL_CREAM | Freq: Every day | VAGINAL | 12 refills | Status: DC

## 2021-10-27 NOTE — ED Provider Notes (Signed)
[facility] Note   CSN: [id number] Arrival date & time: 10/27/21  1049     History No chief complaint on file.   Jane Andrews is a 87 y.o. female with a past medical history of aortic stenosis, A. fib, breast carcinoma, hypertension, hypothyroidism, history of short gut syndrome who presents emergency department chief complaint of vulvar pain.  Patient states that 4 days ago she was watching TV in bed when she suddenly began having pain in her vulvar and clitoral area.  She states that it is got progressively worse.  She applied petroleum jelly which did not help.  She states that overnight her she began having swelling and pain.  She states that her pain is improved when she lies very still but worse whenever she moves, walks, stands.  She states she did have some urinary symptoms described as "trouble with peeing" about 4 days ago and took some Azo Standard.  She denies suprapubic pain, flank pain or fever.  She saw her PCP 3 days ago and states she was given a shot of antibiotic but was told that they did not know what was wrong.  She states the antibiotic did not help.  She describes the pain as aching, nonradiating.  She is not sexually active.  She has mild discharge which has had for a couple of years.  She denies itching, trauma, vaginal bleeding or rashes.  HPI     Past Medical History:  Diagnosis Date   Abdominal adhesions 1994   Allergic rhinitis    Anemia    Anxiety and depression    Aortic stenosis    Arthritis    Atrial fibrillation ([place]) 10/2012   Associated with severe anemia and esophageal pill impaction   Breast carcinoma ([facility])    Right mastectomy   Cholelithiasis    Essential hypertension    Gastroesophageal reflux disease    Hiatal hernia   History of blood transfusion    Hypothyroidism    Low back pain    Malabsorption    Short gut syndrome following small bowel resection surgery x2   Nephrolithiasis 2004   Painless  hematuria   Short gut syndrome    Bowel resection , 2004   Upper GI bleed 2004   Multiple episodes of melena-? due to gastritis or adverse drug effect (nonsteroidals, small bowel ulceration with Fosamax); caused by Pepto-Bismol during one Emergency Department evaluation    Patient Active Problem List   Diagnosis Date Noted   Diarrhea 03/05/2021   Scoliosis 06/26/2020   Thrombocytopenia ([place]) 06/22/2020   Aspiration pneumonia ([place]) 06/22/2020   Chronic constipation 06/21/2020   Post-menopausal osteoporosis 06/21/2020   Essential tremor 06/21/2020   Vitamin B 12 deficiency 06/21/2020   Major depression, recurrent, chronic ([place]) 06/21/2020   Status post total replacement of right hip    AKI (acute kidney injury) ([place])    Postoperative anemia due to acute blood loss    Closed right hip fracture ([place]) 06/14/2020   IDA (iron deficiency anemia) 02/28/2020   Atrial fibrillation with RVR ([place]) 12/25/2018   GERD (gastroesophageal reflux disease) 12/25/2018   Chronic cholecystitis s/p lap cholecystectomy 06/25/2018 06/18/2018   Vagal reaction 04/12/2016   Abdominal pain 09/19/2014   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   Ecchymoses, spontaneous 05/31/2013   Hypothyroid    Atrial fibrillation ([facility])    Breast carcinoma ([facility])    Malabsorption    CKD (chronic kidney disease) stage 3, GFR 30-59 ml/min ([place]) 10/21/2012  Anemia, normocytic normochromic 07/06/2012   Chronic diarrhea 02/10/2012   Hypertension 02/10/2012    Past Surgical History:  Procedure Laterality Date   ABDOMINAL HYSTERECTOMY     emergency s/p delivery   ABDOMINAL HYSTERECTOMY  1960   massive gynecologic bleeding   BOWEL RESECTION     Resulting short gut syndrome   CHOLECYSTECTOMY N/A 06/25/2018   Procedure: LAPAROSCOPIC CHOLECYSTECTOMY WITH INTRAOPERATIVE CHOLANGIOGRAM;  Surgeon: Armandina Gemma, MD;  Location: [facility];  Service: General;  Laterality: N/A;   COLONOSCOPY W/ POLYPECTOMY  2005   Lipoma; diverticulosis    COLONOSCOPY WITH ESOPHAGOGASTRODUODENOSCOPY (EGD)  11/22/2012   [facility]   HIP ARTHROPLASTY Right 06/15/2020   Procedure: ANTERIOR ARTHROPLASTY BIPOLAR HIP (HEMIARTHROPLASTY);  Surgeon: Mcarthur Rossetti, MD;  Location: [facility];  Service: Orthopedics;  Laterality: Right;   LAPAROSCOPIC LYSIS OF ADHESIONS  1965   s/p adhesions   MASTECTOMY  right breast   MASTECTOMY     Carcinoma of the breast; right   UPPER GASTROINTESTINAL ENDOSCOPY       OB History     Gravida      Para      Term      Preterm      AB      Living  0      SAB      IAB      Ectopic      Multiple      Live Births              Family History  Problem Relation Age of Onset   Anuerysm Father    Rheum arthritis Sister    Healthy Sister    COPD Sister    Healthy Brother    Cancer Other    Colon cancer Neg Hx     Social History   Tobacco Use   Smoking status: Former    Packs/day: 1.50    Years: 20.00    Pack years: 30.00    Types: Cigarettes   Smokeless tobacco: Never  Vaping Use   Vaping Use: Never used  Substance Use Topics   Alcohol use: No   Drug use: No    Home Medications Prior to Admission medications   Medication Sig Start Date End Date Taking? Authorizing Provider  conjugated estrogens (PREMARIN) vaginal cream Place 1 Applicatorful vaginally daily. Apply externally to the labia as well. 10/27/21  Yes Margarita Mail, PA-C  acetaminophen (TYLENOL) 650 MG CR tablet Take 650 mg by mouth every 6 (six) hours. 06/28/20   [provider]  Bacillus Coagulans-Inulin (PROBIOTIC FORMULA PO) Take 1 tablet by mouth daily.    [provider]  cyanocobalamin (,VITAMIN B-12,) 1000 MCG/ML injection Inject 1,000 mcg into the muscle every 30 (thirty) days. 07/16/20   [provider]  diclofenac Sodium (VOLTAREN) 1 % GEL Apply 4 g topically 4 (four) times daily. 08/17/21   Chase Picket, MD  diphenoxylate-atropine (LOMOTIL) 2.5-0.025 MG tablet Take 1 tablet by  mouth 4 (four) times daily as needed for diarrhea or loose stools.    [provider]  DULoxetine (CYMBALTA) 30 MG capsule Take 30 mg by mouth daily.    [provider]  DULoxetine (CYMBALTA) 60 MG capsule Take 60 mg by mouth daily. 07/28/20   [provider]  ibuprofen (ADVIL) 600 MG tablet Take 1 tablet (600 mg total) by mouth every 6 (six) hours as needed. 08/17/21   Chase Picket, MD  levothyroxine (SYNTHROID) 75 MCG tablet Take 1  tablet (75 mcg total) by mouth daily before breakfast. 07/09/20   Gerlene Fee, NP  metoprolol succinate (TOPROL-XL) 25 MG 24 hr tablet Take 1 tablet (25 mg total) by mouth daily. 07/09/20   Gerlene Fee, NP  mirtazapine (REMERON) 15 MG tablet Take 15 mg by mouth at bedtime. 10/12/20   [provider]  Multiple Vitamins-Iron (MULTIVITAMINS WITH IRON) TABS tablet Take 1 tablet by mouth daily. 06/19/20   [provider]  pantoprazole (PROTONIX) 40 MG tablet Take 1 tablet (40 mg total) by mouth 2 (two) times daily. Patient taking differently: Take 40 mg by mouth daily. 07/09/20   Gerlene Fee, NP  sodium bicarbonate 650 MG tablet Take 650 mg by mouth 2 (two) times daily. 09/20/20   [provider]  traMADol (ULTRAM) 50 MG tablet Take 1 tablet (50 mg total) by mouth every 6 (six) hours. 07/09/20   Gerlene Fee, NP  VITAMIN D PO Take by mouth daily.    [provider]    Allergies    Codeine  Review of Systems   Review of Systems Ten systems reviewed and are negative for acute change, except as noted in the HPI.   Physical Exam Updated Vital Signs BP (!) 147/77   Pulse 80   Temp 98.6 F (37 C) (Oral)   Resp 17   Ht 5\' 1"  (1.549 m)   Wt 54.4 kg   SpO2 98%   BMI 22.67 kg/m   Physical Exam Vitals and nursing note reviewed. Exam conducted with a chaperone present.  Constitutional:      General: She is not in acute distress.    Appearance: She is well-developed. She is not diaphoretic.   HENT:     Head: Normocephalic and atraumatic.     Right Ear: External ear normal.     Left Ear: External ear normal.     Nose: Nose normal.     Mouth/Throat:     Mouth: Mucous membranes are moist.  Eyes:     General: No scleral icterus.    Conjunctiva/sclera: Conjunctivae normal.  Cardiovascular:     Rate and Rhythm: Normal rate and regular rhythm.     Heart sounds: Normal heart sounds. No murmur heard.   No friction rub. No gallop.  Pulmonary:     Effort: Pulmonary effort is normal. No respiratory distress.     Breath sounds: Normal breath sounds.  Abdominal:     General: Bowel sounds are normal. There is no distension.     Palpations: Abdomen is soft. There is no mass.     Tenderness: There is no abdominal tenderness. There is no guarding.  Musculoskeletal:     Cervical back: Normal range of motion.  Skin:    General: Skin is warm and dry.  Neurological:     Mental Status: She is alert and oriented to person, place, and time.  Psychiatric:        Behavior: Behavior normal.    ED Results / Procedures / Treatments   Labs (all labs ordered are listed, but only abnormal results are displayed) Labs Reviewed  WET PREP, GENITAL - Abnormal; Notable for the following components:      Result Value   WBC, Wet Prep HPF POC FEW (*)    All other components within normal limits  URINALYSIS, ROUTINE W REFLEX MICROSCOPIC - Abnormal; Notable for the following components:   Hgb urine dipstick MODERATE (*)    Leukocytes,Ua SMALL (*)    Bacteria,  UA RARE (*)    All other components within normal limits    EKG None  Radiology No results found.  Procedures Procedures   Medications Ordered in ED Medications - No data to display  ED Course  I have reviewed the triage vital signs and the nursing notes.  Pertinent labs & imaging results that were available during my care of the patient were reviewed by me and considered in my medical decision making (see chart for details).     MDM Rules/Calculators/A&P                           Patient seen and shared visit with Dr. Almyra Free. Patient's physical examination is consistent with vulvovaginitis likely due to estrogen deficiency.  She does not appear to have cellulitis, candidal infection, trauma, spontaneous hematoma.  She had some mild adhesions at the superior aspect of the labia minora.  Patient will be discharged with Premarin cream, she may apply ice externally with barrier for swelling.  I ordered and reviewed the patient's wet prep and UA which appears negative for acute abnormality or infection.  Patient advised to follow closely with GYN and given referral [facility] here in [place].  Patient appears otherwise appropriate for discharge at this time Final Clinical Impression(s) / ED Diagnoses Final diagnoses:  Vulvovaginitis    Rx / DC Orders ED Discharge Orders          Ordered    conjugated estrogens (PREMARIN) vaginal cream  Daily        10/27/21 1226             Margarita Mail, PA-C 10/27/21 1234    Luna Fuse, MD 10/28/21 [phone]

## 2021-10-27 NOTE — Discharge Instructions (Addendum)
Contact a health care provider if: Your discharge looks different than normal. Your vagina has an unusual smell. You have new symptoms. Your symptoms do not improve with treatment. Your symptoms get worse.

## 2021-10-27 NOTE — ED Notes (Signed)
PA at bedside for MSE

## 2021-10-27 NOTE — ED Triage Notes (Signed)
Pt c/o vaginal pain x 4 days

## 2021-12-20 ENCOUNTER — Encounter: Payer: Self-pay | Admitting: Adult Health

## 2021-12-20 ENCOUNTER — Other Ambulatory Visit: Payer: Self-pay

## 2021-12-20 ENCOUNTER — Ambulatory Visit (INDEPENDENT_AMBULATORY_CARE_PROVIDER_SITE_OTHER): Payer: Medicare Other | Admitting: Adult Health

## 2021-12-20 VITALS — BP 136/89 | HR 86 | Ht 61.0 in | Wt 123.2 lb

## 2021-12-20 DIAGNOSIS — R300 Dysuria: Secondary | ICD-10-CM

## 2021-12-20 DIAGNOSIS — N9089 Other specified noninflammatory disorders of vulva and perineum: Secondary | ICD-10-CM

## 2021-12-20 DIAGNOSIS — R399 Unspecified symptoms and signs involving the genitourinary system: Secondary | ICD-10-CM | POA: Diagnosis not present

## 2021-12-20 LAB — POCT URINALYSIS DIPSTICK OB
Glucose, UA: NEGATIVE
Ketones, UA: NEGATIVE
Nitrite, UA: NEGATIVE
POC,PROTEIN,UA: NEGATIVE

## 2021-12-20 MED ORDER — FLUCONAZOLE 100 MG PO TABS: 100.0000 mg | ORAL_TABLET | Freq: Every day | ORAL | 0 refills | Status: DC

## 2021-12-20 NOTE — Progress Notes (Signed)
°  Subjective:     Patient ID: Jane Andrews, female   DOB: Jul 20, 1925, 88 y.o.   MRN: [id number]  HPI Jane Andrews is a 88 year old white female, widowed, sp hysterectomy, worked in for burning with urination, for about 2 weeks, she tried AZO with out relief. She has stage 3 kidney disease and has had breast cancer. PCP is Dr Gerarda Fraction  Review of Systems +burning with urination Reviewed past medical,surgical, social and family history. Reviewed medications and allergies.     Objective:   Physical Exam BP 136/89 (BP Location: Left Arm, Patient Position: Sitting, Cuff Size: Normal)    Pulse 86    Ht 5\' 1"  (1.549 m)    Wt 123 lb 3.2 oz (55.9 kg)    BMI 23.28 kg/m     Urine dipstick large blood and moderate leuks. Skin warm and dry.Pelvic: labia red and swollen, vagina:pale and dry,urethra has no lesions or masses noted, cervix and uterus are absent,adnexa: no masses or tenderness noted. Bladder is non tender and no masses felt. Painted labia with gentian violet. Examination chaperoned by Marcelino Scot RN.  Assessment:     1. Symptoms of urinary tract infection Will send urine for UA C&S - POC Urinalysis Dipstick OB - Urinalysis, Routine w reflex microscopic - Urine Culture  2. Burning with urination Will get UA C&S   3. Vulvar irritation Painted with gentian violet and will rx diflcuan Meds ordered this encounter  Medications   fluconazole (DIFLUCAN) 100 MG tablet    Sig: Take 1 tablet (100 mg total) by mouth daily.    Dispense:  5 tablet    Refill:  0    Order Specific Question:   Supervising Provider    Answer:   Florian Buff [[id number]]       Plan:     Follow up in 1 week, if better can cancel

## 2021-12-21 LAB — URINALYSIS, ROUTINE W REFLEX MICROSCOPIC
Bilirubin, UA: NEGATIVE
Glucose, UA: NEGATIVE
Ketones, UA: NEGATIVE
Nitrite, UA: NEGATIVE
Specific Gravity, UA: 1.013 (ref 1.005–1.030)
Urobilinogen, Ur: 0.2 mg/dL (ref 0.2–1.0)
pH, UA: 6 (ref 5.0–7.5)

## 2021-12-21 LAB — MICROSCOPIC EXAMINATION
Casts: NONE SEEN /lpf
Epithelial Cells (non renal): >10 /hpf — AB (ref 0–10)
WBC, UA: >30 /hpf — AB (ref 0–5)

## 2021-12-23 ENCOUNTER — Telehealth: Payer: Self-pay | Admitting: Adult Health

## 2021-12-23 LAB — URINE CULTURE

## 2021-12-23 NOTE — Telephone Encounter (Signed)
Pt aware no growth on urine, has blood and protein and leuks, call Dr Theador Hawthorne and let him know, you do not need antibiotic at this time

## 2021-12-24 DIAGNOSIS — N1832 Chronic kidney disease, stage 3b: Secondary | ICD-10-CM | POA: Diagnosis not present

## 2021-12-24 DIAGNOSIS — I129 Hypertensive chronic kidney disease with stage 1 through stage 4 chronic kidney disease, or unspecified chronic kidney disease: Secondary | ICD-10-CM | POA: Diagnosis not present

## 2021-12-24 DIAGNOSIS — I5032 Chronic diastolic (congestive) heart failure: Secondary | ICD-10-CM | POA: Diagnosis not present

## 2021-12-24 DIAGNOSIS — D638 Anemia in other chronic diseases classified elsewhere: Secondary | ICD-10-CM | POA: Diagnosis not present

## 2021-12-26 ENCOUNTER — Ambulatory Visit: Payer: Medicare Other | Admitting: Adult Health

## 2021-12-26 DIAGNOSIS — E538 Deficiency of other specified B group vitamins: Secondary | ICD-10-CM | POA: Diagnosis not present

## 2021-12-26 DIAGNOSIS — I10 Essential (primary) hypertension: Secondary | ICD-10-CM | POA: Diagnosis not present

## 2021-12-26 DIAGNOSIS — Z6821 Body mass index (BMI) 21.0-21.9, adult: Secondary | ICD-10-CM | POA: Diagnosis not present

## 2021-12-26 DIAGNOSIS — N12 Tubulo-interstitial nephritis, not specified as acute or chronic: Secondary | ICD-10-CM | POA: Diagnosis not present

## 2021-12-30 ENCOUNTER — Ambulatory Visit: Payer: Medicare Other | Admitting: Adult Health

## 2022-01-07 ENCOUNTER — Encounter: Payer: Self-pay | Admitting: Urology

## 2022-01-07 ENCOUNTER — Ambulatory Visit (INDEPENDENT_AMBULATORY_CARE_PROVIDER_SITE_OTHER): Payer: Medicare Other | Admitting: Urology

## 2022-01-07 ENCOUNTER — Other Ambulatory Visit: Payer: Self-pay

## 2022-01-07 VITALS — BP 126/62 | HR 89 | Ht 61.0 in | Wt 126.0 lb

## 2022-01-07 DIAGNOSIS — R300 Dysuria: Secondary | ICD-10-CM | POA: Diagnosis not present

## 2022-01-07 DIAGNOSIS — N179 Acute kidney failure, unspecified: Secondary | ICD-10-CM

## 2022-01-07 DIAGNOSIS — N200 Calculus of kidney: Secondary | ICD-10-CM | POA: Diagnosis not present

## 2022-01-07 DIAGNOSIS — R8290 Unspecified abnormal findings in urine: Secondary | ICD-10-CM | POA: Diagnosis not present

## 2022-01-07 LAB — URINALYSIS, ROUTINE W REFLEX MICROSCOPIC
Bilirubin, UA: NEGATIVE
Glucose, UA: NEGATIVE
Ketones, UA: NEGATIVE
Nitrite, UA: NEGATIVE
Specific Gravity, UA: 1.015 (ref 1.005–1.030)
Urobilinogen, Ur: 0.2 mg/dL (ref 0.2–1.0)
pH, UA: 5.5 (ref 5.0–7.5)

## 2022-01-07 LAB — MICROSCOPIC EXAMINATION
RBC, Urine: >30 /hpf — AB (ref 0–2)
Renal Epithel, UA: NONE SEEN /hpf

## 2022-01-07 NOTE — Progress Notes (Addendum)
Tracking [phone] Confirmation #[id number]   Urological Symptom Review  Patient is experiencing the following symptoms: Frequent urination Burning/pain with urination Get up at night to urinate Leakage of urine Blood in urine   Review of Systems  Gastrointestinal (upper)  : Negative for upper GI symptoms  Gastrointestinal (lower) : Diarrhea  Constitutional : Negative for symptoms  Skin: Negative for skin symptoms  Eyes: Negative for eye symptoms  Ear/Nose/Throat : Negative for Ear/Nose/Throat symptoms  Hematologic/Lymphatic: Negative for Hematologic/Lymphatic symptoms  Cardiovascular : Negative for cardiovascular symptoms  Respiratory : Negative for respiratory symptoms  Endocrine: Negative for endocrine symptoms  Musculoskeletal: Negative for musculoskeletal symptoms  Neurological: Negative for neurological symptoms  Psychologic: Negative for psychiatric symptoms

## 2022-01-07 NOTE — Progress Notes (Signed)
Assessment: 1. Dysuria   2. Nephrolithiasis   3. Abnormal urine findings     Plan: Resolve MDX urine culture today Will call with results Pyridium or AZO prn CT renal stone protocol Return to office in 3-4 weeks for cystoscopy  Chief Complaint:  Chief Complaint  Patient presents with   Dysuria    History of Present Illness:  Jane Andrews is a 88 y.o. year old female who is seen in consultation from [employer], MD for evaluation of dysuria.  She had onset of symptoms approximately 2 months ago.  She initially noted some redness and swelling of the vaginal area.  She was treated for vulvovaginitis with topical therapy and this improved.  She continued to have dysuria with each void.  No significant frequency.  She has nocturia x3.  She also reports both stress and urge incontinence.  No gross hematuria, flank pain, or low back pain. Urinalysis from 10/27/2021: 21-50 RBCs, 21-50 WBCs, rare bacteria Urinalysis from 12/21/2021: >30 WBCs, 3-10 RBCs, >10 epithelial cells, few bacteria Urine culture from 12/21/2021: 50-100 K mixed flora  CT abdomen and pelvis from 9/21 showed a large staghorn calculus within the right kidney, right renal cortical atrophy, probable proteinaceous/hemorrhagic cyst in the lower pole of the right kidney, large simple appearing cyst upper pole left kidney, 3 mm nonobstructing calculus lower pole left kidney.  Past Medical History:  Past Medical History:  Diagnosis Date   Abdominal adhesions 1994   Allergic rhinitis    Anemia    Anxiety and depression    Aortic stenosis    Arthritis    Atrial fibrillation ([place]) 10/2012   Associated with severe anemia and esophageal pill impaction   Breast carcinoma (HCC)    Right mastectomy   Cholelithiasis    Essential hypertension    Gastroesophageal reflux disease    Hiatal hernia   History of blood transfusion    Hypothyroidism    Low back pain    Malabsorption    Short gut syndrome following small  bowel resection surgery x2   Nephrolithiasis 2004   Painless hematuria   Short gut syndrome    Bowel resection , 2004   Upper GI bleed 2004   Multiple episodes of melena-? due to gastritis or adverse drug effect (nonsteroidals, small bowel ulceration with Fosamax); caused by Pepto-Bismol during one Emergency Department evaluation    Past Surgical History:  Past Surgical History:  Procedure Laterality Date   ABDOMINAL HYSTERECTOMY     emergency s/p delivery   ABDOMINAL HYSTERECTOMY  1960   massive gynecologic bleeding   BOWEL RESECTION     Resulting short gut syndrome   CHOLECYSTECTOMY N/A 06/25/2018   Procedure: LAPAROSCOPIC CHOLECYSTECTOMY WITH INTRAOPERATIVE CHOLANGIOGRAM;  Surgeon: Armandina Gemma, MD;  Location: [facility];  Service: General;  Laterality: N/A;   COLONOSCOPY W/ POLYPECTOMY  2005   Lipoma; diverticulosis   COLONOSCOPY WITH ESOPHAGOGASTRODUODENOSCOPY (EGD)  11/22/2012   [facility]   HIP ARTHROPLASTY Right 06/15/2020   Procedure: ANTERIOR ARTHROPLASTY BIPOLAR HIP (HEMIARTHROPLASTY);  Surgeon: Mcarthur Rossetti, MD;  Location: [facility];  Service: Orthopedics;  Laterality: Right;   LAPAROSCOPIC LYSIS OF ADHESIONS  1965   s/p adhesions   MASTECTOMY  right breast   MASTECTOMY     Carcinoma of the breast; right   UPPER GASTROINTESTINAL ENDOSCOPY      Allergies:  Allergies  Allergen Reactions   Codeine Nausea And Vomiting    Family History:  Family History  Problem Relation Age of Onset  Anuerysm Father    Rheum arthritis Sister    Healthy Sister    COPD Sister    Healthy Brother    Cancer Other    Colon cancer Neg Hx     Social History:  Social History   Tobacco Use   Smoking status: Former    Packs/day: 1.50    Years: 20.00    Pack years: 30.00    Types: Cigarettes   Smokeless tobacco: Never  Vaping Use   Vaping Use: Never used  Substance Use Topics   Alcohol use: No   Drug use: No    Review of symptoms:  Constitutional:  Negative for  unexplained weight loss, night sweats, fever, chills ENT:  Negative for nose bleeds, sinus pain, painful swallowing CV:  Negative for chest pain, shortness of breath, exercise intolerance, palpitations, loss of consciousness Resp:  Negative for cough, wheezing, shortness of breath GI:  Negative for nausea, vomiting, diarrhea, bloody stools GU:  Positives noted in HPI; otherwise negative for gross hematuria Neuro:  Negative for seizures, poor balance, limb weakness, slurred speech Psych:  Negative for lack of energy, depression, anxiety Endocrine:  Negative for polydipsia, polyuria, symptoms of hypoglycemia (dizziness, hunger, sweating) Hematologic:  Negative for anemia, purpura, petechia, prolonged or excessive bleeding, use of anticoagulants  Allergic:  Negative for difficulty breathing or choking as a result of exposure to anything; no shellfish allergy; no allergic response (rash/itch) to materials, foods  Physical exam: BP 126/62 (BP Location: Left Arm)    Pulse 89    Ht 5\' 1"  (1.549 m)    Wt 126 lb (57.2 kg)    BMI 23.81 kg/m  GENERAL APPEARANCE:  Well appearing, well developed, well nourished, NAD HEENT: Atraumatic, Normocephalic, oropharynx clear. NECK: Supple without lymphadenopathy or thyromegaly. LUNGS: Clear to auscultation bilaterally. HEART: Regular Rate and Rhythm without murmurs, gallops, or rubs. ABDOMEN: Soft, non-tender, No Masses. EXTREMITIES: Moves all extremities well.  Without clubbing, cyanosis, or edema. NEUROLOGIC:  Alert and oriented x 3, normal gait, CN II-XII grossly intact.  MENTAL STATUS:  Appropriate. BACK:  Non-tender to palpation.  No CVAT SKIN:  Warm, dry and intact.    Results: U/A:  6-10 WBC, >30 RBC, mod bacteria

## 2022-01-10 ENCOUNTER — Other Ambulatory Visit: Payer: Self-pay

## 2022-01-24 ENCOUNTER — Ambulatory Visit (HOSPITAL_COMMUNITY)
Admission: RE | Admit: 2022-01-24 | Discharge: 2022-01-24 | Disposition: A | Discharge status: Home / Self Care | Payer: Medicare Other | Source: Ambulatory Visit | Attending: Urology | Admitting: Urology

## 2022-01-24 ENCOUNTER — Other Ambulatory Visit: Payer: Self-pay

## 2022-01-24 DIAGNOSIS — N200 Calculus of kidney: Secondary | ICD-10-CM | POA: Insufficient documentation

## 2022-01-24 DIAGNOSIS — N281 Cyst of kidney, acquired: Secondary | ICD-10-CM | POA: Diagnosis not present

## 2022-01-24 DIAGNOSIS — K7689 Other specified diseases of liver: Secondary | ICD-10-CM | POA: Diagnosis not present

## 2022-01-24 DIAGNOSIS — K8689 Other specified diseases of pancreas: Secondary | ICD-10-CM | POA: Diagnosis not present

## 2022-01-31 ENCOUNTER — Telehealth: Payer: Self-pay

## 2022-01-31 NOTE — Telephone Encounter (Signed)
Patient called office for CT results-  Reviewed with patient Dr.Stoneking message of CT stable from urologic standpoint- and would discuss further at 03/03 appt.  Patient voiced understanding

## 2022-02-08 ENCOUNTER — Ambulatory Visit
Admission: EM | Admit: 2022-02-08 | Discharge: 2022-02-08 | Disposition: A | Discharge status: Home / Self Care | Payer: Medicare Other | Attending: Family Medicine | Admitting: Family Medicine

## 2022-02-08 ENCOUNTER — Other Ambulatory Visit: Payer: Self-pay

## 2022-02-08 ENCOUNTER — Encounter: Payer: Self-pay | Admitting: Emergency Medicine

## 2022-02-08 ENCOUNTER — Ambulatory Visit (INDEPENDENT_AMBULATORY_CARE_PROVIDER_SITE_OTHER): Payer: Medicare Other

## 2022-02-08 DIAGNOSIS — R051 Acute cough: Secondary | ICD-10-CM | POA: Diagnosis not present

## 2022-02-08 DIAGNOSIS — R059 Cough, unspecified: Secondary | ICD-10-CM

## 2022-02-08 DIAGNOSIS — R0989 Other specified symptoms and signs involving the circulatory and respiratory systems: Secondary | ICD-10-CM

## 2022-02-08 DIAGNOSIS — H66001 Acute suppurative otitis media without spontaneous rupture of ear drum, right ear: Secondary | ICD-10-CM

## 2022-02-08 DIAGNOSIS — J069 Acute upper respiratory infection, unspecified: Secondary | ICD-10-CM | POA: Diagnosis not present

## 2022-02-08 DIAGNOSIS — R509 Fever, unspecified: Secondary | ICD-10-CM

## 2022-02-08 DIAGNOSIS — J029 Acute pharyngitis, unspecified: Secondary | ICD-10-CM | POA: Diagnosis not present

## 2022-02-08 MED ORDER — FLUTICASONE PROPIONATE 50 MCG/ACT NA SUSP: 1.0000 | Freq: Two times a day (BID) | NASAL | 0 refills | Status: DC

## 2022-02-08 NOTE — ED Triage Notes (Addendum)
Pt reports sore throat initially but is now gone and reports continued cough, chest congestion.  PCP started pt on z-pack yesterday.

## 2022-02-10 ENCOUNTER — Observation Stay (HOSPITAL_COMMUNITY)
Admission: EM | Admit: 2022-02-10 | Discharge: 2022-02-11 | Disposition: A | Discharge status: Home / Self Care | Payer: Medicare Other | Attending: Family Medicine | Admitting: Family Medicine

## 2022-02-10 ENCOUNTER — Emergency Department (HOSPITAL_COMMUNITY): Payer: Medicare Other

## 2022-02-10 ENCOUNTER — Encounter (HOSPITAL_COMMUNITY): Payer: Self-pay

## 2022-02-10 ENCOUNTER — Other Ambulatory Visit: Payer: Self-pay

## 2022-02-10 DIAGNOSIS — Z96641 Presence of right artificial hip joint: Secondary | ICD-10-CM | POA: Insufficient documentation

## 2022-02-10 DIAGNOSIS — J0110 Acute frontal sinusitis, unspecified: Secondary | ICD-10-CM | POA: Diagnosis not present

## 2022-02-10 DIAGNOSIS — I10 Essential (primary) hypertension: Secondary | ICD-10-CM | POA: Diagnosis not present

## 2022-02-10 DIAGNOSIS — Z853 Personal history of malignant neoplasm of breast: Secondary | ICD-10-CM | POA: Insufficient documentation

## 2022-02-10 DIAGNOSIS — E039 Hypothyroidism, unspecified: Secondary | ICD-10-CM | POA: Insufficient documentation

## 2022-02-10 DIAGNOSIS — Z87891 Personal history of nicotine dependence: Secondary | ICD-10-CM | POA: Insufficient documentation

## 2022-02-10 DIAGNOSIS — H6093 Unspecified otitis externa, bilateral: Secondary | ICD-10-CM | POA: Diagnosis present

## 2022-02-10 DIAGNOSIS — R519 Headache, unspecified: Secondary | ICD-10-CM | POA: Diagnosis not present

## 2022-02-10 DIAGNOSIS — I4891 Unspecified atrial fibrillation: Secondary | ICD-10-CM | POA: Insufficient documentation

## 2022-02-10 DIAGNOSIS — K219 Gastro-esophageal reflux disease without esophagitis: Secondary | ICD-10-CM

## 2022-02-10 DIAGNOSIS — J0140 Acute pansinusitis, unspecified: Secondary | ICD-10-CM | POA: Diagnosis not present

## 2022-02-10 DIAGNOSIS — Z79899 Other long term (current) drug therapy: Secondary | ICD-10-CM | POA: Insufficient documentation

## 2022-02-10 DIAGNOSIS — H60503 Unspecified acute noninfective otitis externa, bilateral: Secondary | ICD-10-CM | POA: Diagnosis not present

## 2022-02-10 DIAGNOSIS — R4182 Altered mental status, unspecified: Secondary | ICD-10-CM | POA: Diagnosis not present

## 2022-02-10 DIAGNOSIS — R000 Tachycardia, unspecified: Secondary | ICD-10-CM | POA: Diagnosis not present

## 2022-02-10 DIAGNOSIS — N179 Acute kidney failure, unspecified: Secondary | ICD-10-CM | POA: Diagnosis present

## 2022-02-10 DIAGNOSIS — D649 Anemia, unspecified: Secondary | ICD-10-CM | POA: Diagnosis not present

## 2022-02-10 DIAGNOSIS — H60313 Diffuse otitis externa, bilateral: Secondary | ICD-10-CM | POA: Diagnosis not present

## 2022-02-10 DIAGNOSIS — R509 Fever, unspecified: Secondary | ICD-10-CM | POA: Diagnosis present

## 2022-02-10 DIAGNOSIS — J329 Chronic sinusitis, unspecified: Secondary | ICD-10-CM | POA: Diagnosis present

## 2022-02-10 DIAGNOSIS — Z0389 Encounter for observation for other suspected diseases and conditions ruled out: Secondary | ICD-10-CM | POA: Diagnosis not present

## 2022-02-10 DIAGNOSIS — R6510 Systemic inflammatory response syndrome (SIRS) of non-infectious origin without acute organ dysfunction: Secondary | ICD-10-CM | POA: Insufficient documentation

## 2022-02-10 DIAGNOSIS — Z20822 Contact with and (suspected) exposure to covid-19: Secondary | ICD-10-CM | POA: Insufficient documentation

## 2022-02-10 LAB — COMPREHENSIVE METABOLIC PANEL
ALT: 14 U/L (ref 0–44)
AST: 18 U/L (ref 15–41)
Albumin: 3.9 g/dL (ref 3.5–5.0)
Alkaline Phosphatase: 85 U/L (ref 38–126)
Anion gap: 7 (ref 5–15)
BUN: 54 mg/dL — ABNORMAL HIGH (ref 8–23)
CO2: 22 mmol/L (ref 22–32)
Calcium: 9 mg/dL (ref 8.9–10.3)
Chloride: 106 mmol/L (ref 98–111)
Creatinine, Ser: 1.8 mg/dL — ABNORMAL HIGH (ref 0.44–1.00)
GFR, Estimated: 26 mL/min — ABNORMAL LOW (ref >60)
Glucose, Bld: 129 mg/dL — ABNORMAL HIGH (ref 70–99)
Potassium: 4.5 mmol/L (ref 3.5–5.1)
Sodium: 135 mmol/L (ref 135–145)
Total Bilirubin: 0.6 mg/dL (ref 0.3–1.2)
Total Protein: 7.5 g/dL (ref 6.5–8.1)

## 2022-02-10 LAB — CBC WITH DIFFERENTIAL/PLATELET
Abs Immature Granulocytes: 0.09 10*3/uL — ABNORMAL HIGH (ref 0.00–0.07)
Basophils Absolute: 0.1 10*3/uL (ref 0.0–0.1)
Basophils Relative: 0 %
Eosinophils Absolute: 0.2 10*3/uL (ref 0.0–0.5)
Eosinophils Relative: 1 %
HCT: 31.1 % — ABNORMAL LOW (ref 36.0–46.0)
Hemoglobin: 9.6 g/dL — ABNORMAL LOW (ref 12.0–15.0)
Immature Granulocytes: 1 %
Lymphocytes Relative: 8 %
Lymphs Abs: 1.2 10*3/uL (ref 0.7–4.0)
MCH: 29.8 pg (ref 26.0–34.0)
MCHC: 30.9 g/dL (ref 30.0–36.0)
MCV: 96.6 fL (ref 80.0–100.0)
Monocytes Absolute: 2.3 10*3/uL — ABNORMAL HIGH (ref 0.1–1.0)
Monocytes Relative: 17 %
Neutro Abs: 10.3 10*3/uL — ABNORMAL HIGH (ref 1.7–7.7)
Neutrophils Relative %: 73 %
Platelets: 199 10*3/uL (ref 150–400)
RBC: 3.22 MIL/uL — ABNORMAL LOW (ref 3.87–5.11)
RDW: 13.1 % (ref 11.5–15.5)
WBC: 14.2 10*3/uL — ABNORMAL HIGH (ref 4.0–10.5)
nRBC: 0 % (ref 0.0–0.2)

## 2022-02-10 LAB — COVID-19, FLU A+B NAA
Influenza A, NAA: NOT DETECTED
Influenza B, NAA: NOT DETECTED
SARS-CoV-2, NAA: NOT DETECTED

## 2022-02-10 LAB — PROTIME-INR
INR: 1.1 (ref 0.8–1.2)
Prothrombin Time: 14.5 seconds (ref 11.4–15.2)

## 2022-02-10 LAB — LACTIC ACID, PLASMA
Lactic Acid, Venous: 2.3 mmol/L (ref 0.5–1.9)
Lactic Acid, Venous: 0.7 mmol/L (ref 0.5–1.9)

## 2022-02-10 LAB — APTT: aPTT: 35 seconds (ref 24–36)

## 2022-02-10 LAB — RESP PANEL BY RT-PCR (FLU A&B, COVID) ARPGX2
Influenza A by PCR: NEGATIVE
Influenza B by PCR: NEGATIVE
SARS Coronavirus 2 by RT PCR: NEGATIVE

## 2022-02-10 MED ORDER — SODIUM CHLORIDE 0.9 % IV SOLN
2.0000 g | Freq: Once | INTRAVENOUS | Status: AC
  Administered 2022-02-10: 2 g via INTRAVENOUS
  Filled 2022-02-10: qty 20

## 2022-02-10 MED ORDER — ALPRAZOLAM 0.5 MG PO TABS: 0.5000 mg | ORAL_TABLET | Freq: Every evening | ORAL | Status: DC | PRN

## 2022-02-10 MED ORDER — LEVOTHYROXINE SODIUM 75 MCG PO TABS
75.0000 ug | ORAL_TABLET | Freq: Every day | ORAL | Status: DC
  Administered 2022-02-11: 75 ug via ORAL
  Filled 2022-02-10: qty 1

## 2022-02-10 MED ORDER — METOPROLOL SUCCINATE ER 25 MG PO TB24
25.0000 mg | ORAL_TABLET | Freq: Every day | ORAL | Status: DC
  Administered 2022-02-11: 25 mg via ORAL
  Filled 2022-02-10: qty 1

## 2022-02-10 MED ORDER — LACTATED RINGERS IV BOLUS (SEPSIS)
1000.0000 mL | Freq: Once | INTRAVENOUS | Status: AC
  Administered 2022-02-10: 1000 mL via INTRAVENOUS

## 2022-02-10 MED ORDER — HEPARIN SODIUM (PORCINE) 5000 UNIT/ML IJ SOLN
5000.0000 [IU] | Freq: Three times a day (TID) | INTRAMUSCULAR | Status: DC
  Administered 2022-02-10 – 2022-02-11 (×2): 5000 [IU] via SUBCUTANEOUS
  Filled 2022-02-10 (×2): qty 1

## 2022-02-10 MED ORDER — VITAMIN D 25 MCG (1000 UNIT) PO TABS
1000.0000 [IU] | ORAL_TABLET | Freq: Every day | ORAL | Status: DC
Start: 2022-02-10 — End: 2022-02-11
  Administered 2022-02-11: 1000 [IU] via ORAL
  Filled 2022-02-10: qty 1

## 2022-02-10 MED ORDER — SODIUM BICARBONATE 650 MG PO TABS
650.0000 mg | ORAL_TABLET | Freq: Two times a day (BID) | ORAL | Status: DC
  Administered 2022-02-10 – 2022-02-11 (×2): 650 mg via ORAL
  Filled 2022-02-10 (×2): qty 1

## 2022-02-10 MED ORDER — ACETAMINOPHEN 325 MG PO TABS
650.0000 mg | ORAL_TABLET | Freq: Once | ORAL | Status: AC
  Administered 2022-02-10: 650 mg via ORAL
  Filled 2022-02-10: qty 2

## 2022-02-10 MED ORDER — INFLUENZA VAC A&B SA ADJ QUAD 0.5 ML IM PRSY: 0.5000 mL | PREFILLED_SYRINGE | INTRAMUSCULAR | Status: DC

## 2022-02-10 MED ORDER — HYDROCORTISONE SOD SUC (PF) 100 MG IJ SOLR
100.0000 mg | Freq: Two times a day (BID) | INTRAMUSCULAR | Status: DC
  Administered 2022-02-10: 100 mg via INTRAVENOUS
  Filled 2022-02-10: qty 2

## 2022-02-10 MED ORDER — ZINC GLUCONATE 50 MG PO TABS: 50.0000 mg | ORAL_TABLET | Freq: Every day | ORAL | Status: DC

## 2022-02-10 MED ORDER — DIPHENHYDRAMINE HCL 50 MG/ML IJ SOLN
12.5000 mg | Freq: Once | INTRAMUSCULAR | Status: AC
  Administered 2022-02-10: 12.5 mg via INTRAVENOUS
  Filled 2022-02-10: qty 1

## 2022-02-10 MED ORDER — ACETAMINOPHEN 325 MG PO TABS
650.0000 mg | ORAL_TABLET | Freq: Four times a day (QID) | ORAL | Status: DC | PRN
Start: 2022-02-10 — End: 2022-02-11

## 2022-02-10 MED ORDER — ACETAMINOPHEN 650 MG RE SUPP: 650.0000 mg | Freq: Four times a day (QID) | RECTAL | Status: DC | PRN

## 2022-02-10 MED ORDER — OXYBUTYNIN CHLORIDE 5 MG PO TABS
5.0000 mg | ORAL_TABLET | Freq: Every day | ORAL | Status: DC
  Administered 2022-02-11: 5 mg via ORAL
  Filled 2022-02-10: qty 1

## 2022-02-10 MED ORDER — PROCHLORPERAZINE EDISYLATE 10 MG/2ML IJ SOLN
10.0000 mg | Freq: Once | INTRAMUSCULAR | Status: AC
  Administered 2022-02-10: 10 mg via INTRAVENOUS
  Filled 2022-02-10: qty 2

## 2022-02-10 MED ORDER — KETOROLAC TROMETHAMINE 15 MG/ML IJ SOLN
15.0000 mg | Freq: Once | INTRAMUSCULAR | Status: AC
  Administered 2022-02-10: 15 mg via INTRAVENOUS
  Filled 2022-02-10: qty 1

## 2022-02-10 MED ORDER — SODIUM CHLORIDE 0.9 % IV SOLN: INTRAVENOUS | Status: DC

## 2022-02-10 MED ORDER — ONDANSETRON HCL 4 MG PO TABS: 4.0000 mg | ORAL_TABLET | Freq: Four times a day (QID) | ORAL | Status: DC | PRN

## 2022-02-10 MED ORDER — ASCORBIC ACID 500 MG PO TABS
500.0000 mg | ORAL_TABLET | Freq: Every day | ORAL | Status: DC
  Administered 2022-02-11: 500 mg via ORAL
  Filled 2022-02-10: qty 1

## 2022-02-10 MED ORDER — DULOXETINE HCL 60 MG PO CPEP
60.0000 mg | ORAL_CAPSULE | Freq: Every day | ORAL | Status: DC
  Administered 2022-02-11: 60 mg via ORAL
  Filled 2022-02-10: qty 1

## 2022-02-10 MED ORDER — SODIUM CHLORIDE 0.9 % IV SOLN
1.5000 g | Freq: Two times a day (BID) | INTRAVENOUS | Status: DC
  Filled 2022-02-10 (×2): qty 4

## 2022-02-10 MED ORDER — OXYCODONE HCL 5 MG PO TABS: 5.0000 mg | ORAL_TABLET | ORAL | Status: DC | PRN

## 2022-02-10 MED ORDER — POLYETHYLENE GLYCOL 3350 17 G PO PACK: 17.0000 g | PACK | Freq: Every day | ORAL | Status: DC | PRN

## 2022-02-10 MED ORDER — ONDANSETRON HCL 4 MG/2ML IJ SOLN: 4.0000 mg | Freq: Four times a day (QID) | INTRAMUSCULAR | Status: DC | PRN

## 2022-02-10 NOTE — Assessment & Plan Note (Signed)
Continue Protonix °

## 2022-02-10 NOTE — H&P (Signed)
History and Physical    Patient: Jane Andrews GNF:[id number] DOB: 03-21-1925 DOA: 02/10/2022 DOS: the patient was seen and examined on 02/10/2022 PCP: [employer], MD  Patient coming from: Home  Chief Complaint:  Chief Complaint  Patient presents with   Otalgia    HPI: NECHELLE PETRIZZO is a 88 y.o. female with medical history significant of with history of atrial fibrillation, essential hypertension, GERD, hypothyroidism, malabsorption, short gut syndrome, upper GI bleed, breast cancer, and more presents ED with a chief complaint of ear infections.  Patient reports she had bilateral ear infections for 5 days.  She had intense pressure and sharp pain in both of her ears.  She denies any fever at home, but did have a fever in the ER.  She saw an outpatient facility who prescribed her a Z-Pak.  Patient reports compliance with this for 4 days and it has not improved her symptoms.  She reports that she has felt so sick, and weak that she has not been able to prepare meals, and therefore has a decreased p.o. intake.  Patient reports that she has associated hearing loss.  She has associated postnasal drip no facial tenderness.  Patient reports she is also had a frontal sinus headache during these days.  Nothing would make it go away.  Patient has no other complaints at this time.  Patient does not smoke.  Review of Systems: As mentioned in the history of present illness. All other systems reviewed and are negative. Past Medical History:  Diagnosis Date   Abdominal adhesions 1994   Allergic rhinitis    Anemia    Anxiety and depression    Aortic stenosis    Arthritis    Atrial fibrillation ([place]) 10/2012   Associated with severe anemia and esophageal pill impaction   Breast carcinoma ([facility])    Right mastectomy   Cholelithiasis    Essential hypertension    Gastroesophageal reflux disease    Hiatal hernia   History of blood transfusion    Hypothyroidism    Low back pain     Malabsorption    Short gut syndrome following small bowel resection surgery x2   Nephrolithiasis 2004   Painless hematuria   Short gut syndrome    Bowel resection , 2004   Upper GI bleed 2004   Multiple episodes of melena-? due to gastritis or adverse drug effect (nonsteroidals, small bowel ulceration with Fosamax); caused by Pepto-Bismol during one Emergency Department evaluation   Past Surgical History:  Procedure Laterality Date   ABDOMINAL HYSTERECTOMY     emergency s/p delivery   ABDOMINAL HYSTERECTOMY  1960   massive gynecologic bleeding   BOWEL RESECTION     Resulting short gut syndrome   CHOLECYSTECTOMY N/A 06/25/2018   Procedure: LAPAROSCOPIC CHOLECYSTECTOMY WITH INTRAOPERATIVE CHOLANGIOGRAM;  Surgeon: Armandina Gemma, MD;  Location: [facility];  Service: General;  Laterality: N/A;   COLONOSCOPY W/ POLYPECTOMY  2005   Lipoma; diverticulosis   COLONOSCOPY WITH ESOPHAGOGASTRODUODENOSCOPY (EGD)  11/22/2012   [facility]   HIP ARTHROPLASTY Right 06/15/2020   Procedure: ANTERIOR ARTHROPLASTY BIPOLAR HIP (HEMIARTHROPLASTY);  Surgeon: Mcarthur Rossetti, MD;  Location: [facility];  Service: Orthopedics;  Laterality: Right;   LAPAROSCOPIC LYSIS OF ADHESIONS  1965   s/p adhesions   MASTECTOMY  right breast   MASTECTOMY     Carcinoma of the breast; right   UPPER GASTROINTESTINAL ENDOSCOPY     Social History:  reports that she has quit smoking. Her smoking use included cigarettes. She  has a 30.00 pack-year smoking history. She has never used smokeless tobacco. She reports that she does not drink alcohol and does not use drugs.  Allergies  Allergen Reactions   Codeine Nausea And Vomiting    Family History  Problem Relation Age of Onset   Anuerysm Father    Rheum arthritis Sister    Healthy Sister    COPD Sister    Healthy Brother    Cancer Other    Colon cancer Neg Hx     Prior to Admission medications   Medication Sig Start Date End Date Taking? Authorizing Provider  ALPRAZolam  Duanne Moron) 0.5 MG tablet Take 0.5 mg by mouth at bedtime as needed for anxiety. 12/19/21  Yes [provider]  azithromycin (ZITHROMAX) 250 MG tablet Take 1 tablet by mouth as directed. 02/07/22  Yes [provider]  Bacillus Coagulans-Inulin (PROBIOTIC FORMULA PO) Take 1 tablet by mouth daily.   Yes [provider]  cephALEXin (KEFLEX) 500 MG capsule Take 500 mg by mouth every 12 (twelve) hours. 02/06/22  Yes [provider]  cyanocobalamin (,VITAMIN B-12,) 1000 MCG/ML injection Inject 1,000 mcg into the muscle every 30 (thirty) days. 07/16/20  Yes [provider]  diphenoxylate-atropine (LOMOTIL) 2.5-0.025 MG tablet Take 1 tablet by mouth 4 (four) times daily as needed for diarrhea or loose stools.   Yes [provider]  DULoxetine (CYMBALTA) 30 MG capsule Take 30 mg by mouth daily.   Yes [provider]  DULoxetine (CYMBALTA) 60 MG capsule Take 60 mg by mouth daily. 07/28/20  Yes [provider]  fluticasone (FLONASE) 50 MCG/ACT nasal spray Place 1 spray into both nostrils 2 (two) times daily. 02/08/22  Yes Volney American, PA-C  levothyroxine (SYNTHROID) 75 MCG tablet Take 1 tablet (75 mcg total) by mouth daily before breakfast. 07/09/20  Yes Gerlene Fee, NP  metoprolol succinate (TOPROL-XL) 25 MG 24 hr tablet Take 1 tablet (25 mg total) by mouth daily. 07/09/20  Yes Gerlene Fee, NP  Multiple Vitamins-Iron (MULTIVITAMINS WITH IRON) TABS tablet Take 1 tablet by mouth daily. 06/19/20  Yes [provider]  oxybutynin (DITROPAN) 5 MG tablet Take 5 mg by mouth daily. 12/26/21  Yes [provider]  pantoprazole (PROTONIX) 40 MG tablet Take 1 tablet (40 mg total) by mouth 2 (two) times daily. Patient taking differently: Take 40 mg by mouth daily. 07/09/20  Yes Gerlene Fee, NP  sodium bicarbonate 650 MG tablet Take 650 mg by mouth 2 (two) times daily. 09/20/20  Yes [provider]  traMADol (ULTRAM) 50  MG tablet Take 1 tablet (50 mg total) by mouth every 6 (six) hours. 07/09/20  Yes Gerlene Fee, NP  vitamin C (ASCORBIC ACID) 500 MG tablet Take 500 mg by mouth daily.   Yes [provider]  VITAMIN D PO Take by mouth daily.   Yes [provider]  zinc gluconate 50 MG tablet Take 50 mg by mouth daily.   Yes [provider]  fluconazole (DIFLUCAN) 100 MG tablet Take 1 tablet (100 mg total) by mouth daily. Patient not taking: Reported on 02/10/2022 12/20/21   Estill Dooms, NP    Physical Exam: Vitals:   02/10/22 1815 02/10/22 1819 02/10/22 1902 02/10/22 2029  BP: (!) 112/49  (!) 102/46 (!) 90/49  Pulse: 84  81 79  Resp: 16   16  Temp:  (!) 97.4 F (36.3 C) 97.8 F (36.6 C) 98.1 F (36.7 C)  TempSrc:  Oral Oral Oral  SpO2: 95%  97% 92%  Weight:      Height:       1.  General: Patient lying supine in bed,  no acute distress   2. Psychiatric: Alert and oriented x 3, mood and behavior normal for situation, pleasant and cooperative with exam   3. Neurologic: Speech and language are normal, face is symmetric, moves all 4 extremities voluntarily, at baseline without acute deficits on limited exam   4. HEENMT:  Hard of hearing, head is atraumatic, normocephalic, pupils reactive to light, neck is supple, trachea is midline, mucous membranes are moist   5. Respiratory : Lungs are clear to auscultation bilaterally without wheezing, rhonchi, rales, no cyanosis, no increase in work of breathing or accessory muscle use   6. Cardiovascular : Heart rate normal, rhythm is regular, systolic murmur, rubs or gallops, no peripheral edema, peripheral pulses palpated   7. Gastrointestinal:  Abdomen is soft, nondistended, nontender to palpation bowel sounds active, no masses or organomegaly palpated   8. Skin:  Skin is warm, dry and intact without rashes, acute lesions, or ulcers on limited exam   9.Musculoskeletal:  No acute deformities or trauma, no asymmetry  in tone, no peripheral edema, peripheral pulses palpated, no tenderness to palpation in the extremities   Data Reviewed: In the ED Temp 100.8, heart rate 125, respiratory rate 20, blood pressure 91/74, satting at 95% Leukocytosis with a white blood cell count of 14.2, hemoglobin 9.6 Chemistry panel reveals an elevated creatinine 1.80 Lactic acid 2.3 Negative respiratory panel CT head shows age-related cerebral atrophy, ventriculomegaly, periventricular white matter disease, opacification of sinuses Chest x-ray shows no acute abnormality in the lungs EKG shows a heart rate of 142, SVT, QTc 437 Admission requested to rule out sepsis Patient did have a headache and was given Toradol, Compazine, Benadryl, fluids in the ED Rocephin started in the ED as well   Assessment and Plan: * SIRS (systemic inflammatory response syndrome) ([place])- (present on admission) Temperature 100.8, heart rate 125, white blood cell count 14.2, lactic acid 2.3 Source seems to be sinusitis and ear infections Failed Zithromax outpatient Started on Rocephin in the ED, changed to Unasyn at admission Sepsis order set utilized 1 L fluids given in the ED, continue with gentle IV hydration Continue to monitor   Sinusitis- (present on admission) Maxillary facial tenderness, congested, CT scan showing opacification of the sinuses Failed Z-Pak outpatient Continue Unasyn Associated headache was relieved with Toradol, Compazine, Benadryl and fluids Continue to monitor  Bilateral external ear infections- (present on admission) Continue Unasyn as above  AKI (acute kidney injury) ([place])- (present on admission) Creatinine bumped from 1.4-1.8 In the setting of mild dehydration, possible sepsis 1 L fluids given in the ED Continue gentle IV hydration Avoid nephrotoxic agents when possible Trend in the a.m.  GERD (gastroesophageal reflux disease)- (present on admission) Continue Protonix  Hypothyroid- (present on  admission) Continue Synthroid  Hypertension- (present on admission) Continue metoprolol       Advance Care Planning:   Code Status: Full Code   Consults: None  Family Communication: No family at bedside  Severity of Illness: The appropriate patient status for this patient is INPATIENT. Inpatient status is judged to be reasonable and necessary in order to provide the required intensity of service to ensure the patient's safety. The patient's presenting symptoms, physical exam findings, and initial radiographic and laboratory data in the context of their chronic comorbidities is felt to place them at high risk  for further clinical deterioration. Furthermore, it is not anticipated that the patient will be medically stable for discharge from the hospital within 2 midnights of admission.   * I certify that at the point of admission it is my clinical judgment that the patient will require inpatient hospital care spanning beyond 2 midnights from the point of admission due to high intensity of service, high risk for further deterioration and high frequency of surveillance required.*  Author: Rolla Plate, DO 02/10/2022 9:45 PM  For on call review [email or web address].

## 2022-02-10 NOTE — Assessment & Plan Note (Addendum)
Temperature 100.8, heart rate 125, white blood cell count 14.2, lactic acid 2.3 Source seems to be sinusitis and ear infections Failed Zithromax outpatient Started on Rocephin in the ED, changed to Unasyn at admission Sepsis order set utilized 1 L fluids given in the ED, continue with gentle IV hydration Continue to monitor

## 2022-02-10 NOTE — Assessment & Plan Note (Signed)
Creatinine bumped from 1.4-1.8 In the setting of mild dehydration, possible sepsis 1 L fluids given in the ED Continue gentle IV hydration Avoid nephrotoxic agents when possible Trend in the a.m.

## 2022-02-10 NOTE — Assessment & Plan Note (Signed)
Continue Synthroid °

## 2022-02-10 NOTE — Assessment & Plan Note (Signed)
Continue Unasyn as above

## 2022-02-10 NOTE — Assessment & Plan Note (Signed)
Maxillary facial tenderness, congested, CT scan showing opacification of the sinuses Failed Z-Pak outpatient Continue Unasyn Associated headache was relieved with Toradol, Compazine, Benadryl and fluids Continue to monitor

## 2022-02-10 NOTE — ED Triage Notes (Signed)
Pt presents to ED with complaints of bilateral ear ache. Pt was seen at Urgent Care on 2/25. Pt reports head congestion.

## 2022-02-10 NOTE — ED Notes (Signed)
Pt reports sinus pain, bilateral ear aches and fevers. Seen for same and given abx a few days ago without improvement.

## 2022-02-10 NOTE — Assessment & Plan Note (Signed)
Continue metoprolol. 

## 2022-02-10 NOTE — ED Provider Notes (Signed)
[facility] EMERGENCY DEPARTMENT Provider Note   CSN: [id number] Arrival date & time: 02/10/22  1233     History  Chief Complaint  Patient presents with   Otalgia    Jane Andrews is a 88 y.o. female presenting to ED with fevers, headache, ear pain for the past 5 days.  Patient ports onset of symptoms on Thursday.  She was seen at an urgent care and treated with a course of steroids as well as azithromycin antibiotics for possible sinusitis 2 days ago on 02/08/22.  HPI     Home Medications Prior to Admission medications   Medication Sig Start Date End Date Taking? Authorizing Provider  ALPRAZolam Duanne Moron) 0.5 MG tablet Take 0.5 mg by mouth at bedtime as needed for anxiety. 12/19/21  Yes [provider]  azithromycin (ZITHROMAX) 250 MG tablet Take 1 tablet by mouth as directed. 02/07/22  Yes [provider]  Bacillus Coagulans-Inulin (PROBIOTIC FORMULA PO) Take 1 tablet by mouth daily.   Yes [provider]  cephALEXin (KEFLEX) 500 MG capsule Take 500 mg by mouth every 12 (twelve) hours. 02/06/22  Yes [provider]  cyanocobalamin (,VITAMIN B-12,) 1000 MCG/ML injection Inject 1,000 mcg into the muscle every 30 (thirty) days. 07/16/20  Yes [provider]  diphenoxylate-atropine (LOMOTIL) 2.5-0.025 MG tablet Take 1 tablet by mouth 4 (four) times daily as needed for diarrhea or loose stools.   Yes [provider]  DULoxetine (CYMBALTA) 30 MG capsule Take 30 mg by mouth daily.   Yes [provider]  DULoxetine (CYMBALTA) 60 MG capsule Take 60 mg by mouth daily. 07/28/20  Yes [provider]  fluticasone (FLONASE) 50 MCG/ACT nasal spray Place 1 spray into both nostrils 2 (two) times daily. 02/08/22  Yes Volney American, PA-C  levothyroxine (SYNTHROID) 75 MCG tablet Take 1 tablet (75 mcg total) by mouth daily before breakfast. 07/09/20  Yes Gerlene Fee, NP  metoprolol succinate (TOPROL-XL) 25 MG 24 hr tablet  Take 1 tablet (25 mg total) by mouth daily. 07/09/20  Yes Gerlene Fee, NP  Multiple Vitamins-Iron (MULTIVITAMINS WITH IRON) TABS tablet Take 1 tablet by mouth daily. 06/19/20  Yes [provider]  oxybutynin (DITROPAN) 5 MG tablet Take 5 mg by mouth daily. 12/26/21  Yes [provider]  pantoprazole (PROTONIX) 40 MG tablet Take 1 tablet (40 mg total) by mouth 2 (two) times daily. Patient taking differently: Take 40 mg by mouth daily. 07/09/20  Yes Gerlene Fee, NP  sodium bicarbonate 650 MG tablet Take 650 mg by mouth 2 (two) times daily. 09/20/20  Yes [provider]  traMADol (ULTRAM) 50 MG tablet Take 1 tablet (50 mg total) by mouth every 6 (six) hours. 07/09/20  Yes Gerlene Fee, NP  vitamin C (ASCORBIC ACID) 500 MG tablet Take 500 mg by mouth daily.   Yes [provider]  VITAMIN D PO Take by mouth daily.   Yes [provider]  zinc gluconate 50 MG tablet Take 50 mg by mouth daily.   Yes [provider]  fluconazole (DIFLUCAN) 100 MG tablet Take 1 tablet (100 mg total) by mouth daily. Patient not taking: Reported on 02/10/2022 12/20/21   Estill Dooms, NP      Allergies    Codeine    Review of Systems   Review of Systems  Physical Exam Updated Vital Signs BP 109/62    Pulse (!) 102    Temp 98.7 F (37.1 C) (Oral)  Resp 19    Ht 5\' 1"  (1.549 m)    Wt 54.4 kg    SpO2 96%    BMI 22.67 kg/m  Physical Exam Constitutional:      General: She is not in acute distress. HENT:     Head: Normocephalic and atraumatic.     Comments: Frontal maxillary sinus tenderness    Ears:     Comments: Bilateral external ear effusion, TM poorly visualized, trace blood in the external ear Eyes:     Conjunctiva/sclera: Conjunctivae normal.     Pupils: Pupils are equal, round, and reactive to light.  Cardiovascular:     Rate and Rhythm: Normal rate and regular rhythm.  Pulmonary:     Effort: Pulmonary effort is normal. No respiratory  distress.  Abdominal:     General: There is no distension.     Tenderness: There is no abdominal tenderness.  Skin:    General: Skin is warm and dry.  Neurological:     General: No focal deficit present.     Mental Status: She is alert. Mental status is at baseline.  Psychiatric:        Mood and Affect: Mood normal.        Behavior: Behavior normal.    ED Results / Procedures / Treatments   Labs (all labs ordered are listed, but only abnormal results are displayed) Labs Reviewed  LACTIC ACID, PLASMA - Abnormal; Notable for the following components:      Result Value   Lactic Acid, Venous 2.3 (*)    All other components within normal limits  COMPREHENSIVE METABOLIC PANEL - Abnormal; Notable for the following components:   Glucose, Bld 129 (*)    BUN 54 (*)    Creatinine, Ser 1.80 (*)    GFR, Estimated 26 (*)    All other components within normal limits  CBC WITH DIFFERENTIAL/PLATELET - Abnormal; Notable for the following components:   WBC 14.2 (*)    RBC 3.22 (*)    Hemoglobin 9.6 (*)    HCT 31.1 (*)    Neutro Abs 10.3 (*)    Monocytes Absolute 2.3 (*)    Abs Immature Granulocytes 0.09 (*)    All other components within normal limits  CULTURE, BLOOD (ROUTINE X 2)  CULTURE, BLOOD (ROUTINE X 2)  RESP PANEL BY RT-PCR (FLU A&B, COVID) [id number]  PROTIME-INR  APTT  LACTIC ACID, PLASMA  URINALYSIS, ROUTINE W REFLEX MICROSCOPIC    EKG EKG Interpretation  Date/Time:  Monday February 10 2022 14:00:25 EST Ventricular Rate:  142 PR Interval:    QRS Duration: 80 QT Interval:  284 QTC Calculation: 437 R Axis:   -29 Text Interpretation: Supraventricular tachycardia Borderline left axis deviation Baseline wander in lead(s) V6 Confirmed by Octaviano Glow [phone]) on 02/10/2022 2:29:25 PM  Radiology CT Head Wo Contrast  Result Date: 02/10/2022 CLINICAL DATA:  Mental status change.  Sinus congestion. EXAM: CT HEAD WITHOUT CONTRAST TECHNIQUE: Contiguous axial images were obtained  from the base of the skull through the vertex without intravenous contrast. RADIATION DOSE REDUCTION: This exam was performed according to the departmental dose-optimization program which includes automated exposure control, adjustment of the mA and/or kV according to patient size and/or use of iterative reconstruction technique. COMPARISON:  08/30/2020 FINDINGS: Brain: Stable age related cerebral atrophy, ventriculomegaly and periventricular white matter disease. No extra-axial fluid collections are identified. No CT findings for acute hemispheric infarction or intracranial hemorrhage. No mass lesions. The brainstem and cerebellum are normal. Vascular: Stable  vascular calcifications. No aneurysm or hyperdense vessels. Skull: No skull fracture or bone lesions. Sinuses/Orbits: The paranasal sinuses demonstrate air-fluid levels suggesting sinusitis. The mastoid air cells and middle ear cavities are clear. The globes are intact. Other: No scalp lesions or scalp hematoma. IMPRESSION: 1. Stable age related cerebral atrophy, ventriculomegaly and periventricular white matter disease. 2. No acute intracranial findings or mass lesions. 3. Acute pansinusitis. Electronically Signed   By: Marijo Sanes M.D.   On: 02/10/2022 15:00   DG Chest Port 1 View  Result Date: 02/10/2022 CLINICAL DATA:  Questionable sepsis EXAM: PORTABLE CHEST 1 VIEW COMPARISON:  02/08/2022 FINDINGS: The heart size and mediastinal contours are within normal limits. Both lungs are clear. The visualized skeletal structures are unremarkable. IMPRESSION: No acute abnormality of the lungs in AP portable projection. Electronically Signed   By: Delanna Ahmadi M.D.   On: 02/10/2022 13:58    Procedures Procedures    Medications Ordered in ED Medications  lactated ringers bolus 1,000 mL (0 mLs Intravenous Stopped 02/10/22 1638)  ketorolac (TORADOL) 15 MG/ML injection 15 mg (15 mg Intravenous Given 02/10/22 1406)  acetaminophen (TYLENOL) tablet 650 mg  (650 mg Oral Given 02/10/22 1407)  prochlorperazine (COMPAZINE) injection 10 mg (10 mg Intravenous Given 02/10/22 1530)  diphenhydrAMINE (BENADRYL) injection 12.5 mg (12.5 mg Intravenous Given 02/10/22 1531)  cefTRIAXone (ROCEPHIN) 2 g in sodium chloride 0.9 % 100 mL IVPB (0 g Intravenous Stopped 02/10/22 1601)    ED Course/ Medical Decision Making/ A&P Clinical Course as of 02/10/22 1729  Mon Feb 10, 2022  1506 3. Acute pansinusitis. [MT]  1548 Admitted to hospitalist [MT]    Clinical Course User Index [MT] Del Overfelt, Carola Rhine, MD                           Medical Decision Making Amount and/or Complexity of Data Reviewed Labs: ordered. Radiology: ordered. ECG/medicine tests: ordered.  Risk OTC drugs. Prescription drug management. Decision regarding hospitalization.   This patient presents to the ED with concern for fever, headache, ear pain.  This involves an extensive number of treatment options, and is a complaint that carries with it a high risk of complications and morbidity.  The differential diagnosis includes otitis media with effusion versus viral syndrome versus sinusitis versus other  Patient with SIRS criteria on arrival, febrile and tachycardic.  Is still possible this is a viral syndrome given there is bilateral ear involvement, and therefore I have ordered a blood culture and lactate and some fluids, but will hold on antibiotics until the blood test return (WBC, lactate).    Her headache is largely frontal, no nuchal rigidity.  Lower suspicion for meningitis particularly with ear effusions.  Co-morbidities that complicate the patient evaluation: Advanced age  Additional history obtained from patient's daughter at bedside  I ordered and personally interpreted labs.  The pertinent results include: Leukocytosis, increased BUN and creatinine from baseline  I ordered imaging studies including CT scan of the brain I independently visualized and interpreted imaging which  showed pansinusitis I agree with the radiologist interpretation  The patient was maintained on a cardiac monitor.  I personally viewed and interpreted the cardiac monitored which showed an underlying rhythm of: Sinus tachycardia  Per my interpretation the patient's ECG shows normal sinus rhythm with no acute ischemic findings  I ordered medication including IV Toradol, IV fluids for headache pain and hydration I have reviewed the patients home medicines and have made adjustments as  needed  Test Considered:  -Patient clinical presentation is seemingly more consistent with sinusitis given her headache is frontal, located exactly behind the frontal and maxillary sinus.  I had a lower suspicion for meningitis with no nuchal rigidity or photophobia, I did not feel that she needed an emergent lumbar puncture at this time.  After the interventions noted above, I reevaluated the patient and found that they have: improved  Dispostion:  After consideration of the diagnostic results and the patients response to treatment, I feel that the patent would benefit from medical admission for IV antibiotics and sepsis bacteremia rule out.  If deemed appropriate inpatient team could consider transfer or consultation with ENT for significant pansinusitis disease and bilateral external auditory effusions.        Final Clinical Impression(s) / ED Diagnoses Final diagnoses:  Acute pansinusitis, recurrence not specified  Otitis externa of both ears, unspecified chronicity, unspecified type    Rx / DC Orders ED Discharge Orders     None         Charmaine Placido, Carola Rhine, MD 02/10/22 1729

## 2022-02-11 DIAGNOSIS — D649 Anemia, unspecified: Secondary | ICD-10-CM | POA: Diagnosis present

## 2022-02-11 DIAGNOSIS — N179 Acute kidney failure, unspecified: Secondary | ICD-10-CM | POA: Diagnosis not present

## 2022-02-11 DIAGNOSIS — J0140 Acute pansinusitis, unspecified: Secondary | ICD-10-CM | POA: Diagnosis not present

## 2022-02-11 DIAGNOSIS — R6510 Systemic inflammatory response syndrome (SIRS) of non-infectious origin without acute organ dysfunction: Secondary | ICD-10-CM | POA: Diagnosis not present

## 2022-02-11 LAB — CBC WITH DIFFERENTIAL/PLATELET
Abs Immature Granulocytes: 0.04 K/uL (ref 0.00–0.07)
Basophils Absolute: 0 K/uL (ref 0.0–0.1)
Basophils Relative: 0 %
Eosinophils Absolute: 0 K/uL (ref 0.0–0.5)
Eosinophils Relative: 0 %
HCT: 25.2 % — ABNORMAL LOW (ref 36.0–46.0)
Hemoglobin: 7.7 g/dL — ABNORMAL LOW (ref 12.0–15.0)
Immature Granulocytes: 0 %
Lymphocytes Relative: 10 %
Lymphs Abs: 1 K/uL (ref 0.7–4.0)
MCH: 29.8 pg (ref 26.0–34.0)
MCHC: 30.6 g/dL (ref 30.0–36.0)
MCV: 97.7 fL (ref 80.0–100.0)
Monocytes Absolute: 0.9 K/uL (ref 0.1–1.0)
Monocytes Relative: 9 %
Neutro Abs: 7.7 K/uL (ref 1.7–7.7)
Neutrophils Relative %: 81 %
Platelets: 160 K/uL (ref 150–400)
RBC: 2.58 MIL/uL — ABNORMAL LOW (ref 3.87–5.11)
RDW: 13.2 % (ref 11.5–15.5)
WBC: 9.6 K/uL (ref 4.0–10.5)
nRBC: 0 % (ref 0.0–0.2)

## 2022-02-11 LAB — COMPREHENSIVE METABOLIC PANEL WITH GFR
ALT: 12 U/L (ref 0–44)
AST: 13 U/L — ABNORMAL LOW (ref 15–41)
Albumin: 2.8 g/dL — ABNORMAL LOW (ref 3.5–5.0)
Alkaline Phosphatase: 68 U/L (ref 38–126)
Anion gap: 8 (ref 5–15)
BUN: 54 mg/dL — ABNORMAL HIGH (ref 8–23)
CO2: 21 mmol/L — ABNORMAL LOW (ref 22–32)
Calcium: 7.8 mg/dL — ABNORMAL LOW (ref 8.9–10.3)
Chloride: 108 mmol/L (ref 98–111)
Creatinine, Ser: 1.79 mg/dL — ABNORMAL HIGH (ref 0.44–1.00)
GFR, Estimated: 26 mL/min — ABNORMAL LOW
Glucose, Bld: 122 mg/dL — ABNORMAL HIGH (ref 70–99)
Potassium: 4 mmol/L (ref 3.5–5.1)
Sodium: 137 mmol/L (ref 135–145)
Total Bilirubin: 0.3 mg/dL (ref 0.3–1.2)
Total Protein: 5.9 g/dL — ABNORMAL LOW (ref 6.5–8.1)

## 2022-02-11 LAB — TSH: TSH: 0.582 u[IU]/mL (ref 0.350–4.500)

## 2022-02-11 LAB — VITAMIN B12: Vitamin B-12: 489 pg/mL (ref 180–914)

## 2022-02-11 LAB — FOLATE: Folate: 43.7 ng/mL

## 2022-02-11 LAB — IRON AND TIBC
Iron: 10 ug/dL — ABNORMAL LOW (ref 28–170)
Saturation Ratios: 3 % — ABNORMAL LOW (ref 10.4–31.8)
TIBC: 343 ug/dL (ref 250–450)
UIBC: 333 ug/dL

## 2022-02-11 LAB — FERRITIN: Ferritin: 376 ng/mL — ABNORMAL HIGH (ref 11–307)

## 2022-02-11 LAB — MAGNESIUM: Magnesium: 1.9 mg/dL (ref 1.7–2.4)

## 2022-02-11 LAB — PROTIME-INR
INR: 1.1 (ref 0.8–1.2)
Prothrombin Time: 14.3 seconds (ref 11.4–15.2)

## 2022-02-11 LAB — CORTISOL-AM, BLOOD: Cortisol - AM: 27 ug/dL — ABNORMAL HIGH (ref 6.7–22.6)

## 2022-02-11 LAB — PREPARE RBC (CROSSMATCH)

## 2022-02-11 LAB — PROCALCITONIN: Procalcitonin: 0.3 ng/mL

## 2022-02-11 MED ORDER — CARBAMIDE PEROXIDE 6.5 % OT SOLN: 5.0000 [drp] | Freq: Two times a day (BID) | OTIC | 0 refills | Status: AC

## 2022-02-11 MED ORDER — AMOXICILLIN-POT CLAVULANATE 500-125 MG PO TABS: 1.0000 | ORAL_TABLET | Freq: Two times a day (BID) | ORAL | 0 refills | Status: AC

## 2022-02-11 MED ORDER — FUROSEMIDE 10 MG/ML IJ SOLN
20.0000 mg | Freq: Once | INTRAMUSCULAR | Status: AC
  Administered 2022-02-11: 20 mg via INTRAVENOUS
  Filled 2022-02-11: qty 2

## 2022-02-11 MED ORDER — SODIUM CHLORIDE 0.9% IV SOLUTION: Freq: Once | INTRAVENOUS | Status: AC

## 2022-02-11 NOTE — Care Management Obs Status (Signed)
[facility] NOTIFICATION   Patient Details  Name: Jane Andrews MRN: [id number] Date of Birth: 06-02-1925   Medicare Observation Status Notification Given:  Yes    Shade Flood, LCSW 02/11/2022, 11:43 AM

## 2022-02-11 NOTE — Care Management CC44 (Signed)
Condition Code 44 Documentation Completed  Patient Details  Name: Jane Andrews MRN: [id number] Date of Birth: 10/22/1925   Condition Code 44 given:  Yes Patient signature on Condition Code 44 notice:  Yes Documentation of 2 MD's agreement:  Yes Code 44 added to claim:  Yes    Shade Flood, LCSW 02/11/2022, 11:43 AM

## 2022-02-11 NOTE — TOC Progression Note (Signed)
°  Transition of Care [facility]) Screening Note   Patient Details  Name: Jane Andrews Date of Birth: 1925-07-08   Transition of Care [facility]) CM/SW Contact:    Shade Flood, LCSW Phone Number: 02/11/2022, 11:43 AM    Transition of Care Department [facility]) has reviewed patient and no TOC needs have been identified at this time. We will continue to monitor patient advancement through interdisciplinary progression rounds. If new patient transition needs arise, please place a TOC consult.

## 2022-02-11 NOTE — Discharge Summary (Incomplete)
Jane Andrews, is a 88 y.o. female  DOB 1925-08-29  MRN [id number].  Admission date:  02/10/2022  Admitting Physician  Roxan Hockey, MD  Discharge Date:  02/11/2022   Primary MD  Redmond School, MD  Recommendations for primary care physician for things to follow:   1)Avoid ibuprofen/Advil/Aleve/Motrin/Goody Powders/Naproxen/BC powders/Meloxicam/Diclofenac/Indomethacin and other Nonsteroidal anti-inflammatory medications as these will make you more likely to bleed and can cause stomach ulcers, can also cause Kidney problems.   2)Follow-up with Dr. Laural Golden gastroenterologist for possible iron infusions once to twice every month  3)Repeat CBC and BMP blood test with primary care physician on Monday, 02/17/2022  4)Debrox (peroxide) eardrops as advised--- follow-up with primary care physician on 02/17/2022 for irrigation of both ears due to wax impaction  5)Please complete Cephalexin/Keflex and azithromycin antibiotics as previously prescribed  6)Please continue Lomotil  for diarrhea and probiotic as previously advised  Admission Diagnosis  SIRS (systemic inflammatory response syndrome) (HCC) [R65.10] Acute pansinusitis, recurrence not specified [J01.40] Otitis externa of both ears, unspecified chronicity, unspecified type [H60.93] Symptomatic anemia [D64.9]   Discharge Diagnosis  SIRS (systemic inflammatory response syndrome) (HCC) [R65.10] Acute pansinusitis, recurrence not specified [J01.40] Otitis externa of both ears, unspecified chronicity, unspecified type [H60.93] Symptomatic anemia [D64.9]    Principal Problem:   SIRS (systemic inflammatory response syndrome) ([place]) Active Problems:   Hypertension   Hypothyroid   GERD (gastroesophageal reflux disease)   AKI (acute kidney injury) ([place])   Bilateral external ear infections   Sinusitis   Symptomatic anemia      Past Medical History:   Diagnosis Date   Abdominal adhesions 1994   Allergic rhinitis    Anemia    Anxiety and depression    Aortic stenosis    Arthritis    Atrial fibrillation ([place]) 10/2012   Associated with severe anemia and esophageal pill impaction   Breast carcinoma (HCC)    Right mastectomy   Cholelithiasis    Essential hypertension    Gastroesophageal reflux disease    Hiatal hernia   History of blood transfusion    Hypothyroidism    Low back pain    Malabsorption    Short gut syndrome following small bowel resection surgery x2   Nephrolithiasis 2004   Painless hematuria   Short gut syndrome    Bowel resection , 2004   Upper GI bleed 2004   Multiple episodes of melena-? due to gastritis or adverse drug effect (nonsteroidals, small bowel ulceration with Fosamax); caused by Pepto-Bismol during one Emergency Department evaluation    Past Surgical History:  Procedure Laterality Date   ABDOMINAL HYSTERECTOMY     emergency s/p delivery   ABDOMINAL HYSTERECTOMY  1960   massive gynecologic bleeding   BOWEL RESECTION     Resulting short gut syndrome   CHOLECYSTECTOMY N/A 06/25/2018   Procedure: LAPAROSCOPIC CHOLECYSTECTOMY WITH INTRAOPERATIVE CHOLANGIOGRAM;  Surgeon: Armandina Gemma, MD;  Location: [facility];  Service: General;  Laterality: N/A;   COLONOSCOPY W/ POLYPECTOMY  2005   Lipoma; diverticulosis   COLONOSCOPY  WITH ESOPHAGOGASTRODUODENOSCOPY (EGD)  11/22/2012   [facility]   HIP ARTHROPLASTY Right 06/15/2020   Procedure: ANTERIOR ARTHROPLASTY BIPOLAR HIP (HEMIARTHROPLASTY);  Surgeon: Mcarthur Rossetti, MD;  Location: [facility];  Service: Orthopedics;  Laterality: Right;   LAPAROSCOPIC LYSIS OF ADHESIONS  1965   s/p adhesions   MASTECTOMY  right breast   MASTECTOMY     Carcinoma of the breast; right   UPPER GASTROINTESTINAL ENDOSCOPY         HPI  from the history and physical done on the day of admission:     HPI: Jane Andrews is a 88 y.o. female with medical history significant of  with history of atrial fibrillation, essential hypertension, GERD, hypothyroidism, malabsorption, short gut syndrome, upper GI bleed, breast cancer, and more presents ED with a chief complaint of ear infections.  Patient reports she had bilateral ear infections for 5 days.  She had intense pressure and sharp pain in both of her ears.  She denies any fever at home, but did have a fever in the ER.  She saw an outpatient facility who prescribed her a Z-Pak.  Patient reports compliance with this for 4 days and it has not improved her symptoms.  She reports that she has felt so sick, and weak that she has not been able to prepare meals, and therefore has a decreased p.o. intake.  Patient reports that she has associated hearing loss.  She has associated postnasal drip no facial tenderness.  Patient reports she is also had a frontal sinus headache during these days.  Nothing would make it go away.  Patient has no other complaints at this time.   Patient does not smoke.   Review of Systems: As mentioned in the history of present illness. All other systems reviewed and are negative    Hospital Course:    Assessment and Plan: SIRS (systemic inflammatory response syndrome) ([place])- (present on admission) Temperature 100.8, heart rate 125, white blood cell count 14.2, lactic acid 2.3 Source seems to be sinusitis and ear infections Failed Zithromax outpatient Started on Rocephin in the ED, changed to Unasyn at admission Sepsis order set utilized 1 L fluids given in the ED, continue with gentle IV hydration Continue to monitor     Sinusitis- (present on admission) Maxillary facial tenderness, congested, CT scan showing opacification of the sinuses Failed Z-Pak outpatient Continue Unasyn Associated headache was relieved with Toradol, Compazine, Benadryl and fluids Continue to monitor  Acute on Chronic Anemia--   Bilateral external ear infections- (present on admission) Continue Unasyn as above   AKI  (acute kidney injury) ([place])- (present on admission) Creatinine bumped from 1.4-1.8 In the setting of mild dehydration, possible sepsis 1 L fluids given in the ED Continue gentle IV hydration Avoid nephrotoxic agents when possible Trend in the a.m.   GERD (gastroesophageal reflux disease)- (present on admission) Continue Protonix   Hypothyroid- (present on admission) Continue Synthroid   Hypertension- (present on admission) Continue metoprolol  -Acute on chronic anemia--- patient received 1 unit of PRBC, follow-up with Dr. Kallie Edward for iron infusion and transfusions  Discharge Condition: ***  Follow UP     Consults obtained - ***  Diet and Activity recommendation:  As advised  Discharge Instructions    **** Discharge Instructions     Call MD for:  difficulty breathing, headache or visual disturbances   Complete by: As directed    Call MD for:  persistant dizziness or light-headedness   Complete by: As directed  Call MD for:  persistant nausea and vomiting   Complete by: As directed    Call MD for:  temperature >100.4   Complete by: As directed    Diet - low sodium heart healthy   Complete by: As directed    Discharge instructions   Complete by: As directed    1)Avoid ibuprofen/Advil/Aleve/Motrin/Goody Powders/Naproxen/BC powders/Meloxicam/Diclofenac/Indomethacin and other Nonsteroidal anti-inflammatory medications as these will make you more likely to bleed and can cause stomach ulcers, can also cause Kidney problems.   2)Follow-up with Dr. Laural Golden gastroenterologist for possible iron infusions once to twice every month  3)Repeat CBC and BMP blood test with primary care physician on Monday, 02/17/2022  4)Debrox (peroxide) eardrops as advised--- follow-up with primary care physician on 02/17/2022 for irrigation of both ears due to wax impaction  5)Please complete Cephalexin/Keflex and azithromycin antibiotics as previously prescribed  6)Please continue Lomotil  for  diarrhea and probiotic as previously advised   Increase activity slowly   Complete by: As directed          Discharge Medications     Allergies as of 02/11/2022       Reactions   Codeine Nausea And Vomiting        Medication List     STOP taking these medications    azithromycin 250 MG tablet Commonly known as: ZITHROMAX   cephALEXin 500 MG capsule Commonly known as: KEFLEX   fluconazole 100 MG tablet Commonly known as: DIFLUCAN       TAKE these medications    ALPRAZolam 0.5 MG tablet Commonly known as: XANAX Take 0.5 mg by mouth at bedtime as needed for anxiety.   amoxicillin-clavulanate 500-125 MG tablet Commonly known as: Augmentin Take 1 tablet (500 mg total) by mouth in the morning and at bedtime for 7 days.   carbamide peroxide 6.5 % OTIC solution Commonly known as: DEBROX Place 5 drops into both ears 2 (two) times daily for 7 days.   cyanocobalamin 1000 MCG/ML injection Commonly known as: (VITAMIN B-12) Inject 1,000 mcg into the muscle every 30 (thirty) days.   diphenoxylate-atropine 2.5-0.025 MG tablet Commonly known as: LOMOTIL Take 1 tablet by mouth 4 (four) times daily as needed for diarrhea or loose stools.   DULoxetine 30 MG capsule Commonly known as: CYMBALTA Take 30 mg by mouth daily. What changed: Another medication with the same name was removed. Continue taking this medication, and follow the directions you see here.   fluticasone 50 MCG/ACT nasal spray Commonly known as: FLONASE Place 1 spray into both nostrils 2 (two) times daily.   levothyroxine 75 MCG tablet Commonly known as: SYNTHROID Take 1 tablet (75 mcg total) by mouth daily before breakfast.   metoprolol succinate 25 MG 24 hr tablet Commonly known as: TOPROL-XL Take 1 tablet (25 mg total) by mouth daily.   multivitamins with iron Tabs tablet Take 1 tablet by mouth daily.   oxybutynin 5 MG tablet Commonly known as: DITROPAN Take 5 mg by mouth daily.    pantoprazole 40 MG tablet Commonly known as: PROTONIX Take 1 tablet (40 mg total) by mouth 2 (two) times daily. What changed: when to take this   PROBIOTIC FORMULA PO Take 1 tablet by mouth daily.   sodium bicarbonate 650 MG tablet Take 650 mg by mouth 2 (two) times daily.   traMADol 50 MG tablet Commonly known as: ULTRAM Take 1 tablet (50 mg total) by mouth every 6 (six) hours.   vitamin C 500 MG tablet Commonly known as:  ASCORBIC ACID Take 500 mg by mouth daily.   VITAMIN D PO Take by mouth daily.   zinc gluconate 50 MG tablet Take 50 mg by mouth daily.        Major procedures and Radiology Reports - PLEASE review detailed and final reports for all details, in brief -   ***  DG Chest 2 View  Result Date: 02/08/2022 CLINICAL DATA:  Table formatting from the original note was not included. Pt reports sore throat initially but is now gone and reports continued cough, chest congestion. EXAM: CHEST - 2 VIEW COMPARISON:  08/30/2020 FINDINGS: Cardiac silhouette is normal in size. No mediastinal or hilar masses or evidence of adenopathy. Clear lungs.  No pleural effusion or pneumothorax. Stable changes from prior right breast surgery. Skeletal structures are intact. IMPRESSION: No active cardiopulmonary disease. Electronically Signed   By: Lajean Manes M.D.   On: 02/08/2022 14:40   CT Head Wo Contrast  Result Date: 02/10/2022 CLINICAL DATA:  Mental status change.  Sinus congestion. EXAM: CT HEAD WITHOUT CONTRAST TECHNIQUE: Contiguous axial images were obtained from the base of the skull through the vertex without intravenous contrast. RADIATION DOSE REDUCTION: This exam was performed according to the departmental dose-optimization program which includes automated exposure control, adjustment of the mA and/or kV according to patient size and/or use of iterative reconstruction technique. COMPARISON:  08/30/2020 FINDINGS: Brain: Stable age related cerebral atrophy, ventriculomegaly  and periventricular white matter disease. No extra-axial fluid collections are identified. No CT findings for acute hemispheric infarction or intracranial hemorrhage. No mass lesions. The brainstem and cerebellum are normal. Vascular: Stable vascular calcifications. No aneurysm or hyperdense vessels. Skull: No skull fracture or bone lesions. Sinuses/Orbits: The paranasal sinuses demonstrate air-fluid levels suggesting sinusitis. The mastoid air cells and middle ear cavities are clear. The globes are intact. Other: No scalp lesions or scalp hematoma. IMPRESSION: 1. Stable age related cerebral atrophy, ventriculomegaly and periventricular white matter disease. 2. No acute intracranial findings or mass lesions. 3. Acute pansinusitis. Electronically Signed   By: Marijo Sanes M.D.   On: 02/10/2022 15:00   DG Chest Port 1 View  Result Date: 02/10/2022 CLINICAL DATA:  Questionable sepsis EXAM: PORTABLE CHEST 1 VIEW COMPARISON:  02/08/2022 FINDINGS: The heart size and mediastinal contours are within normal limits. Both lungs are clear. The visualized skeletal structures are unremarkable. IMPRESSION: No acute abnormality of the lungs in AP portable projection. Electronically Signed   By: Delanna Ahmadi M.D.   On: 02/10/2022 13:58   CT RENAL STONE STUDY  Result Date: 01/26/2022 CLINICAL DATA:  Nephrolithiasis in a 88 year old female. The large renal calculus noted on prior CT scan from 2021. EXAM: CT ABDOMEN AND PELVIS WITHOUT CONTRAST TECHNIQUE: Multidetector CT imaging of the abdomen and pelvis was performed following the standard protocol without IV contrast. RADIATION DOSE REDUCTION: This exam was performed according to the departmental dose-optimization program which includes automated exposure control, adjustment of the mA and/or kV according to patient size and/or use of iterative reconstruction technique. COMPARISON:  Comparison is made with August 24, 2020. FINDINGS: Lower chest: Basilar atelectasis. No  effusion. No consolidative changes. Hepatobiliary: Post cholecystectomy. Mildly nodular contours of the liver. No visible lesion on noncontrast imaging. Pancreas: Fatty atrophy of the pancreatic head. No signs of pancreatic inflammation. Spleen: Normal size and contour. Adrenals/Urinary Tract: Adrenal glands are normal. Large branched/staghorn calculus fills all lower pole and some interpolar infundibular elements extending into caliceal elements as well and into the renal pelvis measuring  19 mm AP dimension. Findings are stable with respect to the renal pelvis and peripheral extension. This spares the upper pole and there is no hydronephrosis of upper pole collecting system elements. Parenchyma overlying this large branched calculus is stable. A stable 13 mm lesion arising from the lower pole the RIGHT kidney displays intermediate density of 56 Hounsfield units shown to represent a cyst on previous imaging likely now with some hemorrhage. Not substantially changed since 2018. Renal cysts on the LEFT. Tiny lower pole calculi on the LEFT without change. No ureteral calculi. No gross urinary bladder abnormality though the urinary bladder is under distended in there is streak artifact from a RIGHT hip arthroplasty. Stomach/Bowel: No acute gastrointestinal process. Appendix not visualized no secondary signs to suggest inflammation about the cecum. No stranding adjacent to the stomach. No substantial small bowel dilation. Vascular/Lymphatic: Aortic atherosclerosis. No sign of aneurysm. Smooth contour of the IVC. There is no gastrohepatic or hepatoduodenal ligament lymphadenopathy. No retroperitoneal or mesenteric lymphadenopathy. No pelvic sidewall lymphadenopathy. Vascular structures with limited assessment due to lack of intravenous contrast. Reproductive: Cystic area about the RIGHT pelvis/adnexa measures 4.0 x 2.7 cm as compared to 4.0 x 2.7 cm on the previous study. Other: No ascites Musculoskeletal: Osteopenia No  acute bone finding. No destructive bone process. Spinal degenerative changes. IMPRESSION: 1. Stable staghorn calculus in the RIGHT kidney associated with renal pelvis than lower pole and interpolar collecting system elements. No perinephric stranding. 2. Tiny lower pole calculi on the LEFT without change. 3. Cystic area about the RIGHT pelvis/adnexa measures 4.0 x 2.7 cm as compared to 4.0 x 2.7 cm on the previous study. Recommend follow-up US in 6-12 months as warranted if not yet performed. Note: This recommendation does not apply to premenarchal patients and to those with increased risk (genetic, family history, elevated tumor markers or other high-risk factors) of ovarian cancer. Reference: JACR 2020 Feb; 17(2):248-254 4. Aortic atherosclerosis. Aortic Atherosclerosis (ICD10-I70.0). Electronically Signed   By: Zetta Bills M.D.   On: 01/26/2022 22:33    Micro Results   *** Recent Results (from the past 240 hour(s))  Covid-19, Flu A+B ([facility])     Status: None   Collection Time: 02/08/22  2:22 PM   Specimen: Nasopharyngeal   Naso  Result Value Ref Range Status   SARS-CoV-2, NAA Not Detected Not Detected Final   Influenza A, NAA Not Detected Not Detected Final   Influenza B, NAA Not Detected Not Detected Final   Test Information: Comment  Final    Comment: This nucleic acid amplification test was developed and its performance characteristics determined by [employer]. Nucleic acid amplification tests include RT-PCR and TMA. This test has not been FDA cleared or approved. This test has been authorized by FDA under an Emergency Use Authorization (EUA). This test is only authorized for the duration of time the declaration that circumstances exist justifying the authorization of the emergency use of in vitro diagnostic tests for detection of SARS-CoV-2 virus and/or diagnosis of COVID-19 infection under section 564(b)(1) of the Act, 21 U.S.C. 564PPI-9(J) (1), unless the authorization  is terminated or revoked sooner. When diagnostic testing is negative, the possibility of a false negative result should be considered in the context of a patient's recent exposures and the presence of clinical signs and symptoms consistent with COVID-19. An individual without symptoms of COVID-19 and who is not shedding SARS-CoV-2 virus wo uld expect to have a negative (not detected) result in this assay.   Blood Culture (routine  x 2)     Status: None (Preliminary result)   Collection Time: 02/10/22  1:33 PM   Specimen: BLOOD  Result Value Ref Range Status   Specimen Description BLOOD BLOOD LEFT WRIST  Final   Special Requests   Final    BOTTLES DRAWN AEROBIC AND ANAEROBIC Blood Culture results may not be optimal due to an inadequate volume of blood received in culture bottles   Culture   Final    NO GROWTH < 24 HOURS Performed at [facility], [address] [place], [place] [address]    Report Status PENDING  Incomplete  Blood Culture (routine x 2)     Status: None (Preliminary result)   Collection Time: 02/10/22  1:48 PM   Specimen: BLOOD  Result Value Ref Range Status   Specimen Description BLOOD BLOOD LEFT ARM  Final   Special Requests   Final    BOTTLES DRAWN AEROBIC AND ANAEROBIC Blood Culture adequate volume   Culture   Final    NO GROWTH < 24 HOURS Performed at [facility], [address], [place], [place] [address]    Report Status PENDING  Incomplete  Resp Panel by RT-PCR (Flu A&B, Covid) Nasopharyngeal Swab     Status: None   Collection Time: 02/10/22  1:59 PM   Specimen: Nasopharyngeal Swab; Nasopharyngeal(NP) swabs in vial transport medium  Result Value Ref Range Status   SARS Coronavirus 2 by RT PCR NEGATIVE NEGATIVE Final    Comment: (NOTE) SARS-CoV-2 target nucleic acids are NOT DETECTED.  The SARS-CoV-2 RNA is generally detectable in upper respiratory specimens during the acute phase of infection. The lowest concentration of SARS-CoV-2 viral copies this  assay can detect is 138 copies/mL. A negative result does not preclude SARS-Cov-2 infection and should not be used as the sole basis for treatment or other patient management decisions. A negative result may occur with  improper specimen collection/handling, submission of specimen other than nasopharyngeal swab, presence of viral mutation(s) within the areas targeted by this assay, and inadequate number of viral copies(<138 copies/mL). A negative result must be combined with clinical observations, patient history, and epidemiological information. The expected result is Negative.  Fact Sheet for Patients:  EntrepreneurPulse.com.au  Fact Sheet for Healthcare Providers:  IncredibleEmployment.be  This test is no t yet approved or cleared by the [place] FDA and  has been authorized for detection and/or diagnosis of SARS-CoV-2 by FDA under an Emergency Use Authorization (EUA). This EUA will remain  in effect (meaning this test can be used) for the duration of the COVID-19 declaration under Section 564(b)(1) of the Act, 21 U.S.C.section 360bbb-3(b)(1), unless the authorization is terminated  or revoked sooner.       Influenza A by PCR NEGATIVE NEGATIVE Final   Influenza B by PCR NEGATIVE NEGATIVE Final    Comment: (NOTE) The Xpert Xpress SARS-CoV-2/FLU/RSV plus assay is intended as an aid in the diagnosis of influenza from Nasopharyngeal swab specimens and should not be used as a sole basis for treatment. Nasal washings and aspirates are unacceptable for Xpert Xpress SARS-CoV-2/FLU/RSV testing.  Fact Sheet for Patients: EntrepreneurPulse.com.au  Fact Sheet for Healthcare Providers: IncredibleEmployment.be  This test is not yet approved or cleared by the [place] FDA and has been authorized for detection and/or diagnosis of SARS-CoV-2 by FDA under an Emergency Use Authorization (EUA). This EUA will  remain in effect (meaning this test can be used) for the duration of the COVID-19 declaration under Section 564(b)(1) of the Act, 21  U.S.C. section 360bbb-3(b)(1), unless the authorization is terminated or revoked.  Performed at [facility], [address], [place], [place] [address]     Today   Subjective    Delorese Sellin today has no ***          Patient has been seen and examined prior to discharge   Objective   Blood pressure 100/79, pulse 80, temperature 97.6 F (36.4 C), resp. rate 18, height 5\' 1"  (1.549 m), weight 54.4 kg, SpO2 99 %.   Intake/Output Summary (Last 24 hours) at 02/11/2022 1658 Last data filed at 02/11/2022 1300 Gross per 24 hour  Intake 480 ml  Output --  Net 480 ml    Exam Gen:- Awake Alert, no acute distress *** HEENT:- [place].AT, No sclera icterus Neck-Supple Neck,No JVD,.  Lungs-  CTAB , good air movement bilaterally CV- S1, S2 normal, regular Abd-  +ve B.Sounds, Abd Soft, No tenderness,    Extremity/Skin:- No  edema,   good pulses Psych-affect is appropriate, oriented x3 Neuro-no new focal deficits, no tremors ***   Data Review   CBC w Diff:  Lab Results  Component Value Date   WBC 9.6 02/11/2022   HGB 7.7 (L) 02/11/2022   HCT 25.2 (L) 02/11/2022   PLT 160 02/11/2022   LYMPHOPCT 10 02/11/2022   MONOPCT 9 02/11/2022   EOSPCT 0 02/11/2022   BASOPCT 0 02/11/2022    CMP:  Lab Results  Component Value Date   NA 137 02/11/2022   K 4.0 02/11/2022   CL 108 02/11/2022   CO2 21 (L) 02/11/2022   BUN 54 (H) 02/11/2022   CREATININE 1.79 (H) 02/11/2022   CREATININE 1.31 (H) 09/23/2018   PROT 5.9 (L) 02/11/2022   ALBUMIN 2.8 (L) 02/11/2022   BILITOT 0.3 02/11/2022   ALKPHOS 68 02/11/2022   AST 13 (L) 02/11/2022   ALT 12 02/11/2022  .  Total Discharge time is about 33 minutes  Roxan Hockey M.D on 02/11/2022 at 4:58 PM  Go to [email or web address] -  for contact info  [facility] - Office  [phone]

## 2022-02-11 NOTE — Discharge Instructions (Signed)
1)Avoid ibuprofen/Advil/Aleve/Motrin/Goody Powders/Naproxen/BC powders/Meloxicam/Diclofenac/Indomethacin and other Nonsteroidal anti-inflammatory medications as these will make you more likely to bleed and can cause stomach ulcers, can also cause Kidney problems.   2)Follow-up with Dr. Laural Golden gastroenterologist for possible iron infusions once to twice every month  3)Repeat CBC and BMP blood test with primary care physician on Monday, 02/17/2022  4)Debrox (peroxide) eardrops as advised--- follow-up with primary care physician on 02/17/2022 for irrigation of both ears due to wax impaction  5)Please complete Cephalexin/Keflex and azithromycin antibiotics as previously prescribed  6)Please continue Lomotil  for diarrhea and probiotic as previously advised

## 2022-02-12 LAB — TYPE AND SCREEN
ABO/RH(D): O POS
Antibody Screen: NEGATIVE
Unit division: 0

## 2022-02-12 LAB — BPAM RBC
Blood Product Expiration Date: 202304052359
ISSUE DATE / TIME: 202302281329
Unit Type and Rh: 5100

## 2022-02-12 NOTE — ED Provider Notes (Signed)
[facility]    CSN: [id number] Arrival date & time: 02/08/22  1239      History   Chief Complaint Chief Complaint  Patient presents with   Cough    HPI Jane Andrews is a 88 y.o. female.   Presenting today with family member for evaluation of several days of sore throat that is now resolved, cough, chest congestion, right ear pain, fever, fatigue.  She denies shortness of breath, chest pain, abdominal pain, nausea vomiting diarrhea, intolerance to p.o.  Has been trying over-the-counter remedies, started on azithromycin through her PCP yesterday with so far no improvement.  No known sick contacts recently.  Past Medical History:  Diagnosis Date   Abdominal adhesions 1994   Allergic rhinitis    Anemia    Anxiety and depression    Aortic stenosis    Arthritis    Atrial fibrillation ([place]) 10/2012   Associated with severe anemia and esophageal pill impaction   Breast carcinoma ([place])    Right mastectomy   Cholelithiasis    Essential hypertension    Gastroesophageal reflux disease    Hiatal hernia   History of blood transfusion    Hypothyroidism    Low back pain    Malabsorption    Short gut syndrome following small bowel resection surgery x2   Nephrolithiasis 2004   Painless hematuria   Short gut syndrome    Bowel resection , 2004   Upper GI bleed 2004   Multiple episodes of melena-? due to gastritis or adverse drug effect (nonsteroidals, small bowel ulceration with Fosamax); caused by Pepto-Bismol during one Emergency Department evaluation   Patient Active Problem List   Diagnosis Date Noted   Symptomatic anemia 02/11/2022   SIRS (systemic inflammatory response syndrome) ([place]) 02/10/2022   Bilateral external ear infections 02/10/2022   Sinusitis 02/10/2022   Nephrolithiasis 01/07/2022   Vulvar irritation 12/20/2021   Burning with urination 12/20/2021   Symptoms of urinary tract infection 12/20/2021   Diarrhea 03/05/2021   Scoliosis 06/26/2020    Thrombocytopenia ([place]) 06/22/2020   Aspiration pneumonia ([place]) 06/22/2020   Chronic constipation 06/21/2020   Post-menopausal osteoporosis 06/21/2020   Essential tremor 06/21/2020   Vitamin B 12 deficiency 06/21/2020   Major depression, recurrent, chronic ([place]) 06/21/2020   Status post total replacement of right hip    AKI (acute kidney injury) ([place])    Postoperative anemia due to acute blood loss    Closed right hip fracture ([place]) 06/14/2020   IDA (iron deficiency anemia) 02/28/2020   Atrial fibrillation with RVR ([place]) 12/25/2018   GERD (gastroesophageal reflux disease) 12/25/2018   Chronic cholecystitis s/p lap cholecystectomy 06/25/2018 06/18/2018   Vagal reaction 04/12/2016   Abdominal pain 09/19/2014   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   Ecchymoses, spontaneous 05/31/2013   Hypothyroid    Atrial fibrillation (HCC)    Breast carcinoma (HCC)    Malabsorption    CKD (chronic kidney disease) stage 3, GFR 30-59 ml/min ([place]) 10/21/2012   Anemia, normocytic normochromic 07/06/2012   Chronic diarrhea 02/10/2012   Hypertension 02/10/2012   Past Surgical History:  Procedure Laterality Date   ABDOMINAL HYSTERECTOMY     emergency s/p delivery   ABDOMINAL HYSTERECTOMY  1960   massive gynecologic bleeding   BOWEL RESECTION     Resulting short gut syndrome   CHOLECYSTECTOMY N/A 06/25/2018   Procedure: LAPAROSCOPIC CHOLECYSTECTOMY WITH INTRAOPERATIVE CHOLANGIOGRAM;  Surgeon: Armandina Gemma, MD;  Location: [facility];  Service: General;  Laterality: N/A;   COLONOSCOPY W/  POLYPECTOMY  2005   Lipoma; diverticulosis   COLONOSCOPY WITH ESOPHAGOGASTRODUODENOSCOPY (EGD)  11/22/2012   [facility]   HIP ARTHROPLASTY Right 06/15/2020   Procedure: ANTERIOR ARTHROPLASTY BIPOLAR HIP (HEMIARTHROPLASTY);  Surgeon: Mcarthur Rossetti, MD;  Location: [facility];  Service: Orthopedics;  Laterality: Right;   LAPAROSCOPIC LYSIS OF ADHESIONS  1965   s/p adhesions   MASTECTOMY  right breast   MASTECTOMY      Carcinoma of the breast; right   UPPER GASTROINTESTINAL ENDOSCOPY      OB History     Gravida  2   Para      Term      Preterm      AB  1   Living  0      SAB  1   IAB      Ectopic      Multiple      Live Births  1          Home Medications    Prior to Admission medications   Medication Sig Start Date End Date Taking? Authorizing Provider  fluticasone (FLONASE) 50 MCG/ACT nasal spray Place 1 spray into both nostrils 2 (two) times daily. 02/08/22  Yes Volney American, PA-C  ALPRAZolam Duanne Moron) 0.5 MG tablet Take 0.5 mg by mouth at bedtime as needed for anxiety. 12/19/21   [provider]  amoxicillin-clavulanate (AUGMENTIN) 500-125 MG tablet Take 1 tablet (500 mg total) by mouth in the morning and at bedtime for 7 days. 02/11/22 02/18/22  Roxan Hockey, MD  Bacillus Coagulans-Inulin (PROBIOTIC FORMULA PO) Take 1 tablet by mouth daily.    [provider]  carbamide peroxide (DEBROX) 6.5 % OTIC solution Place 5 drops into both ears 2 (two) times daily for 7 days. 02/11/22 02/18/22  Roxan Hockey, MD  cyanocobalamin (,VITAMIN B-12,) 1000 MCG/ML injection Inject 1,000 mcg into the muscle every 30 (thirty) days. 07/16/20   [provider]  diphenoxylate-atropine (LOMOTIL) 2.5-0.025 MG tablet Take 1 tablet by mouth 4 (four) times daily as needed for diarrhea or loose stools.    [provider]  DULoxetine (CYMBALTA) 30 MG capsule Take 30 mg by mouth daily.    [provider]  levothyroxine (SYNTHROID) 75 MCG tablet Take 1 tablet (75 mcg total) by mouth daily before breakfast. 07/09/20   Gerlene Fee, NP  metoprolol succinate (TOPROL-XL) 25 MG 24 hr tablet Take 1 tablet (25 mg total) by mouth daily. 07/09/20   Gerlene Fee, NP  Multiple Vitamins-Iron (MULTIVITAMINS WITH IRON) TABS tablet Take 1 tablet by mouth daily. 06/19/20   [provider]  oxybutynin (DITROPAN) 5 MG tablet Take 5 mg by mouth daily. 12/26/21    [provider]  pantoprazole (PROTONIX) 40 MG tablet Take 1 tablet (40 mg total) by mouth 2 (two) times daily. Patient taking differently: Take 40 mg by mouth daily. 07/09/20   Gerlene Fee, NP  sodium bicarbonate 650 MG tablet Take 650 mg by mouth 2 (two) times daily. 09/20/20   [provider]  traMADol (ULTRAM) 50 MG tablet Take 1 tablet (50 mg total) by mouth every 6 (six) hours. 07/09/20   Gerlene Fee, NP  vitamin C (ASCORBIC ACID) 500 MG tablet Take 500 mg by mouth daily.    [provider]  VITAMIN D PO Take by mouth daily.    [provider]  zinc gluconate 50 MG tablet Take 50 mg by mouth daily.    [provider]  Family History Family History  Problem Relation Age of Onset   Anuerysm Father    Rheum arthritis Sister    Healthy Sister    COPD Sister    Healthy Brother    Cancer Other    Colon cancer Neg Hx     Social History Social History   Tobacco Use   Smoking status: Former    Packs/day: 1.50    Years: 20.00    Pack years: 30.00    Types: Cigarettes   Smokeless tobacco: Never  Vaping Use   Vaping Use: Never used  Substance Use Topics   Alcohol use: No   Drug use: No     Allergies   Codeine   Review of Systems Review of Systems PER HPI  Physical Exam Triage Vital Signs ED Triage Vitals [02/08/22 1418]  Enc Vitals Group     BP 119/79     Pulse Rate 94     Resp 20     Temp 100.1 F (37.8 C)     Temp Source Oral     SpO2 91 %     Weight 120 lb (54.4 kg)     Height 5\' 1"  (1.549 m)     Head Circumference      Peak Flow      Pain Score 0     Pain Loc      Pain Edu?      Excl. in [place]?    No data found.  Updated Vital Signs BP 119/79 (BP Location: Right Arm)    Pulse 94    Temp 100.1 F (37.8 C) (Oral)    Resp 20    Ht 5\' 1"  (1.549 m)    Wt 120 lb (54.4 kg)    SpO2 91%    BMI 22.67 kg/m   Visual Acuity Right Eye Distance:   Left Eye Distance:   Bilateral Distance:    Right Eye  Near:   Left Eye Near:    Bilateral Near:     Physical Exam Vitals and nursing note reviewed.  Constitutional:      Appearance: Normal appearance.  HENT:     Head: Atraumatic.     Right Ear: External ear normal.     Left Ear: Tympanic membrane and external ear normal.     Ears:     Comments: Right TM erythematous and edematous    Nose: Rhinorrhea present.     Mouth/Throat:     Mouth: Mucous membranes are moist.     Pharynx: No posterior oropharyngeal erythema.  Eyes:     Extraocular Movements: Extraocular movements intact.     Conjunctiva/sclera: Conjunctivae normal.  Cardiovascular:     Rate and Rhythm: Normal rate and regular rhythm.     Heart sounds: Normal heart sounds.  Pulmonary:     Effort: Pulmonary effort is normal.     Breath sounds: Normal breath sounds. No wheezing.  Musculoskeletal:        General: Normal range of motion.     Cervical back: Normal range of motion and neck supple.  Skin:    General: Skin is warm and dry.  Neurological:     Mental Status: She is alert and oriented to person, place, and time.  Psychiatric:        Mood and Affect: Mood normal.        Thought Content: Thought content normal.   UC Treatments / Results  Labs (all labs ordered are listed, but only abnormal results are  displayed) Labs Reviewed  COVID-19, FLU A+B NAA   Narrative:    Performed at:  [facility] [address], [place], [place]  [id number] Lab Director: Rush Farmer MD, Phone:  [phone]   EKG  Radiology No results found.  Procedures Procedures (including critical care time)  Medications Ordered in UC Medications - No data to display  Initial Impression / Assessment and Plan / UC Course  I have reviewed the triage vital signs and the nursing notes.  Pertinent labs & imaging results that were available during my care of the patient were reviewed by me and considered in my medical decision making (see chart for details).     Febrile in  triage and oxygen saturation on the lower end of normal today, but overall fairly well-appearing on exam.  She is noted to have a right ear infection and what is suspected to be a viral upper respiratory infection, COVID and flu testing pending today.  She is already on antibiotics through her PCP that started yesterday, complete this course.  We will add Flonase, continue fever reducers and go to the emergency department if symptoms worsening at any time.  Final Clinical Impressions(s) / UC Diagnoses   Final diagnoses:  Acute cough  Non-recurrent acute suppurative otitis media of right ear without spontaneous rupture of tympanic membrane  Viral URI with cough  Fever, unspecified   Discharge Instructions   None    ED Prescriptions     Medication Sig Dispense Auth. Provider   fluticasone (FLONASE) 50 MCG/ACT nasal spray Place 1 spray into both nostrils 2 (two) times daily. 16 g Volney American, [place]      PDMP not reviewed this encounter.   Volney American, [place] 02/12/22 1713

## 2022-02-14 ENCOUNTER — Other Ambulatory Visit: Payer: Medicare Other | Admitting: Urology

## 2022-02-15 LAB — CULTURE, BLOOD (ROUTINE X 2)
Culture: NO GROWTH
Culture: NO GROWTH
Special Requests: ADEQUATE

## 2022-02-18 ENCOUNTER — Ambulatory Visit (INDEPENDENT_AMBULATORY_CARE_PROVIDER_SITE_OTHER): Payer: Medicare Other | Admitting: Gastroenterology

## 2022-02-18 ENCOUNTER — Encounter (INDEPENDENT_AMBULATORY_CARE_PROVIDER_SITE_OTHER): Payer: Self-pay | Admitting: Gastroenterology

## 2022-02-18 ENCOUNTER — Other Ambulatory Visit: Payer: Self-pay

## 2022-02-18 VITALS — BP 147/83 | HR 90 | Temp 98.0°F | Ht 61.0 in | Wt 123.2 lb

## 2022-02-18 DIAGNOSIS — D509 Iron deficiency anemia, unspecified: Secondary | ICD-10-CM | POA: Diagnosis not present

## 2022-02-18 DIAGNOSIS — Z6821 Body mass index (BMI) 21.0-21.9, adult: Secondary | ICD-10-CM | POA: Diagnosis not present

## 2022-02-18 DIAGNOSIS — D508 Other iron deficiency anemias: Secondary | ICD-10-CM | POA: Diagnosis not present

## 2022-02-18 DIAGNOSIS — H6123 Impacted cerumen, bilateral: Secondary | ICD-10-CM | POA: Diagnosis not present

## 2022-02-18 NOTE — Patient Instructions (Signed)
We will recheck your blood counts and iron levels today ?I am also sending you with 3 stool cards, please bring these back once you have completed them so that they can be checked for presence of blood in your stools ?Please continue taking the multivitamin with Iron that you are on ?Please start lactobacillus rhamnosus, this is a probiotic, you can take this for 10-14 days after finishing your antibiotic to help restore good bacteria in your GI tract. ? ?We will discuss further recommendations once labs and stool card results are back. ?Please make me aware of any rectal bleeding or black stools. ?

## 2022-02-18 NOTE — Progress Notes (Cosign Needed)
Referring Provider: Redmond School, MD Primary Care Physician:  Redmond School, MD Primary GI Physician: Rehman  Chief Complaint  Patient presents with   Hospitalization Follow-up    Patient here today for a hospital follow up. Patient was seen at [facility] on 02/10/2022 and was told to follow up here at the patient hgb and Fe was low. She was transfused with one unit of PRBC and told to follow up here. She is not taking any po fe. Has some headaches, and weakness upon exertion . Denies any shortness of breath or dizziness. Has some dark stools.(Sample brought for your review). On Amoxicillin 500-125 one po Bid.    HPI:   Jane Andrews is a 88 y.o. female with past medical history of GERD, IDA, previus bowel resection, SIBO.   Patient presenting today for hospital follow up.  Last seen in office 03/05/21.  Patient presented to ED on 02/10/22 for bilateral ear pain and congestion. Pt found to have hgb of 9.6 on admission which dropped to 7.7 with MCV 97.7 with iron 10, TIBC 343, saturation 3 and ferritin 376, B12 and Folate WNL. She received 1 unit PRBCs during admission, though was without Overt GI bleeding. No FOBT done. She denied any rectal bleeding or melena.  Patient has hx of IDA for many years, previously maintained on oral Iron.  Today, Patient states that she is still feeling somewhat weak, denies dizziness or SOB. she denies any blood in stools or black stools. She denies abdominal pain,  nausea or vomiting. She is taking flintstone multivitamin with iron in it, she takes this daily. Patients niece reports that patient was having heavier nose bleeds prior to hospitalization, and wonders if this contributed to her low hgb and iron levels. Patient did bring a stool sample in with her today without any obvious presence of blood. No recent CBC done since 2/28, prior to blood transfusion. Having some diarrhea since being on amoxicillin, though she has recently finished  this.  Last Colonoscopy:12/9/13Few diverticula at sigmoid colon and small external hemorrhoids otherwise normal colonoscopy to ileocolonic anastomosis located in the vicinity of hepatic flexure or ascending colon. Last Endoscopy:12/9/13Normal esophagogastroduodenoscopy.   Past Medical History:  Diagnosis Date   Abdominal adhesions 1994   Allergic rhinitis    Anemia    Anxiety and depression    Aortic stenosis    Arthritis    Atrial fibrillation ([place]) 10/2012   Associated with severe anemia and esophageal pill impaction   Breast carcinoma ([facility])    Right mastectomy   Cholelithiasis    Essential hypertension    Gastroesophageal reflux disease    Hiatal hernia   History of blood transfusion    Hypothyroidism    Low back pain    Malabsorption    Short gut syndrome following small bowel resection surgery x2   Nephrolithiasis 2004   Painless hematuria   Short gut syndrome    Bowel resection , 2004   Upper GI bleed 2004   Multiple episodes of melena-? due to gastritis or adverse drug effect (nonsteroidals, small bowel ulceration with Fosamax); caused by Pepto-Bismol during one Emergency Department evaluation    Past Surgical History:  Procedure Laterality Date   ABDOMINAL HYSTERECTOMY     emergency s/p delivery   ABDOMINAL HYSTERECTOMY  1960   massive gynecologic bleeding   BOWEL RESECTION     Resulting short gut syndrome   CHOLECYSTECTOMY N/A 06/25/2018   Procedure: LAPAROSCOPIC CHOLECYSTECTOMY WITH INTRAOPERATIVE CHOLANGIOGRAM;  Surgeon:  Armandina Gemma, MD;  Location: [facility];  Service: General;  Laterality: N/A;   COLONOSCOPY W/ POLYPECTOMY  2005   Lipoma; diverticulosis   COLONOSCOPY WITH ESOPHAGOGASTRODUODENOSCOPY (EGD)  11/22/2012   [facility]   HIP ARTHROPLASTY Right 06/15/2020   Procedure: ANTERIOR ARTHROPLASTY BIPOLAR HIP (HEMIARTHROPLASTY);  Surgeon: Mcarthur Rossetti, MD;  Location: [facility];  Service: Orthopedics;  Laterality: Right;   LAPAROSCOPIC LYSIS OF ADHESIONS   1965   s/p adhesions   MASTECTOMY  right breast   MASTECTOMY     Carcinoma of the breast; right   UPPER GASTROINTESTINAL ENDOSCOPY      Current Outpatient Medications  Medication Sig Dispense Refill   ALPRAZolam (XANAX) 0.5 MG tablet Take 0.5 mg by mouth at bedtime as needed for anxiety.     amoxicillin-clavulanate (AUGMENTIN) 500-125 MG tablet Take 1 tablet (500 mg total) by mouth in the morning and at bedtime for 7 days. 14 tablet 0   Bacillus Coagulans-Inulin (PROBIOTIC FORMULA PO) Take 1 tablet by mouth daily.     carbamide peroxide (DEBROX) 6.5 % OTIC solution Place 5 drops into both ears 2 (two) times daily for 7 days. 3.5 mL 0   cyanocobalamin (,VITAMIN B-12,) 1000 MCG/ML injection Inject 1,000 mcg into the muscle every 30 (thirty) days.     diphenoxylate-atropine (LOMOTIL) 2.5-0.025 MG tablet Take 1 tablet by mouth 4 (four) times daily as needed for diarrhea or loose stools.     DULoxetine (CYMBALTA) 30 MG capsule Take 30 mg by mouth daily.     fluticasone (FLONASE) 50 MCG/ACT nasal spray Place 1 spray into both nostrils 2 (two) times daily. 16 g 0   levothyroxine (SYNTHROID) 75 MCG tablet Take 1 tablet (75 mcg total) by mouth daily before breakfast. 30 tablet 0   metoprolol succinate (TOPROL-XL) 25 MG 24 hr tablet Take 1 tablet (25 mg total) by mouth daily. 30 tablet 0   Multiple Vitamins-Iron (MULTIVITAMINS WITH IRON) TABS tablet Take 1 tablet by mouth daily.     oxybutynin (DITROPAN) 5 MG tablet Take 5 mg by mouth daily.     pantoprazole (PROTONIX) 40 MG tablet Take 1 tablet (40 mg total) by mouth 2 (two) times daily. (Patient taking differently: Take 40 mg by mouth daily.) 60 tablet 0   sodium bicarbonate 650 MG tablet Take 650 mg by mouth 2 (two) times daily.     traMADol (ULTRAM) 50 MG tablet Take 1 tablet (50 mg total) by mouth every 6 (six) hours. 60 tablet 0   vitamin C (ASCORBIC ACID) 500 MG tablet Take 500 mg by mouth daily.     VITAMIN D PO Take by mouth daily.     zinc  gluconate 50 MG tablet Take 50 mg by mouth daily.     No current facility-administered medications for this visit.    Allergies as of 02/18/2022 - Review Complete 02/18/2022  Allergen Reaction Noted   Codeine Nausea And Vomiting 06/13/2012    Family History  Problem Relation Age of Onset   Anuerysm Father    Rheum arthritis Sister    Healthy Sister    COPD Sister    Healthy Brother    Cancer Other    Colon cancer Neg Hx     Social History   Socioeconomic History   Marital status: Widowed    Spouse name: Not on file   Number of children: Not on file   Years of education: 12   Highest education level: Not on file  Occupational History  Not on file  Tobacco Use   Smoking status: Former    Packs/day: 1.50    Years: 20.00    Pack years: 30.00    Types: Cigarettes   Smokeless tobacco: Never  Vaping Use   Vaping Use: Never used  Substance and Sexual Activity   Alcohol use: No   Drug use: No   Sexual activity: Not Currently    Birth control/protection: Post-menopausal, Surgical    Comment: hyst  Other Topics Concern   Not on file  Social History Narrative   Not on file   Social Determinants of Health   Financial Resource Strain: Not on file  Food Insecurity: Not on file  Transportation Needs: Not on file  Physical Activity: Not on file  Stress: Not on file  Social Connections: Not on file   Review of systems General: negative for night sweats, fever, chills, weight loss +fatigue Neck: Negative for lumps, goiter, pain and significant neck swelling Resp: Negative for cough, wheezing, dyspnea at rest CV: Negative for chest pain, leg swelling, palpitations, orthopnea GI: denies melena, hematochezia, nausea, vomiting,  constipation, dysphagia, odyonophagia, early satiety or unintentional weight loss. +diarrhea MSK: Negative for joint pain or swelling, back pain, and muscle pain. Derm: Negative for itching or rash Psych: Denies depression, anxiety, memory loss,  confusion. No homicidal or suicidal ideation.  Heme: Negative for prolonged bleeding, bruising easily, and swollen nodes. Endocrine: Negative for cold or heat intolerance, polyuria, polydipsia and goiter. Neuro: negative for tremor, gait imbalance, syncope and seizures. The remainder of the review of systems is noncontributory.  Physical Exam: BP (!) 147/83 (BP Location: Left Arm, Patient Position: Sitting, Cuff Size: Small)    Pulse 90    Temp 98 F (36.7 C) (Oral)    Ht '5\' 1"'$  (1.549 m)    Wt 123 lb 3.2 oz (55.9 kg)    BMI 23.28 kg/m  General:   Alert and oriented. No distress noted. Pleasant and cooperative.  Head:  Normocephalic and atraumatic. Eyes:  Conjuctiva clear without scleral icterus. Mouth:  Oral mucosa pink and moist. Good dentition. No lesions. Heart: Normal rate and rhythm, s1 and s2 heart sounds present.  Lungs: Clear lung sounds in all lobes. Respirations equal and unlabored. Abdomen:  +BS, soft, non-tender and non-distended. No rebound or guarding. No HSM or masses noted. Derm: No palmar erythema or jaundice Msk:  Symmetrical without gross deformities. Normal posture. Extremities:  Without edema. Neurologic:  Alert and  oriented x4 Psych:  Alert and cooperative. Normal mood and affect.  Invalid input(s): 6 MONTHS   ASSESSMENT: NAFEESAH LAPAGLIA is a 88 y.o. female presenting today for hospital follow up after findings of hgb to be 7.7 with decreased iron studies, requiring blood transfusion.  Patient with long hx of IDA, taking flintstone vitamin with Iron. has been without any overt GI bleeding, did have some relatively heavy volume nose bleeds just prior to admission, no FOBT checked during hospitalization. She continues with some fatigue and some diarrhea from recent antibiotic therapy. We will recheck CBC and Iron studies today as well as occult stool cards x3. given patient's age, I discussed conservative investigation at this time with checking blood counts and  for presence of blood in stools. If occult stool cards are indicative of GI blood loss, we will further discuss the possibility of proceeding with endoscopic evaluation, though at this time, given patient's long hx of IDA, I suspect this may have been influence by presence of large volume nose bleeds.  If FOBT is negative and iron is still very low, will consider iron infusions. All questions were answered, patient and her niece, [name], verbalized understanding and is in agreement with plan as outline above.    PLAN:  Occult stool card x3 2. Repeat CBC, Iron studies 3. Start probiotic lactobacillus rhamnosus x10-14 days after completion of antibiotic 4. Continue flintstone vitamin with iron 5. Pt to make me aware of any melena or rectal bleeding   Follow Up: TBD   Maytal Mijangos L. Alver Sorrow, MSN, APRN, AGNP-C Adult-Gerontology Nurse Practitioner [facility]

## 2022-02-19 LAB — CBC
HCT: 31.1 % — ABNORMAL LOW (ref 35.0–45.0)
Hemoglobin: 10.1 g/dL — ABNORMAL LOW (ref 11.7–15.5)
MCH: 29.9 pg (ref 27.0–33.0)
MCHC: 32.5 g/dL (ref 32.0–36.0)
MCV: 92 fL (ref 80.0–100.0)
MPV: 9.8 fL (ref 7.5–12.5)
Platelets: 242 10*3/uL (ref 140–400)
RBC: 3.38 10*6/uL — ABNORMAL LOW (ref 3.80–5.10)
RDW: 12.8 % (ref 11.0–15.0)
WBC: 10.4 10*3/uL (ref 3.8–10.8)

## 2022-02-19 LAB — IRON,TIBC AND FERRITIN PANEL
%SAT: 22 % (calc) (ref 16–45)
Ferritin: 306 ng/mL — ABNORMAL HIGH (ref 16–288)
Iron: 69 ug/dL (ref 45–160)
TIBC: 314 mcg/dL (calc) (ref 250–450)

## 2022-02-24 DIAGNOSIS — E875 Hyperkalemia: Secondary | ICD-10-CM | POA: Diagnosis not present

## 2022-02-24 DIAGNOSIS — Z79899 Other long term (current) drug therapy: Secondary | ICD-10-CM | POA: Diagnosis not present

## 2022-02-24 DIAGNOSIS — D638 Anemia in other chronic diseases classified elsewhere: Secondary | ICD-10-CM | POA: Diagnosis not present

## 2022-02-24 DIAGNOSIS — I129 Hypertensive chronic kidney disease with stage 1 through stage 4 chronic kidney disease, or unspecified chronic kidney disease: Secondary | ICD-10-CM | POA: Diagnosis not present

## 2022-02-24 DIAGNOSIS — N1832 Chronic kidney disease, stage 3b: Secondary | ICD-10-CM | POA: Diagnosis not present

## 2022-02-25 ENCOUNTER — Telehealth (INDEPENDENT_AMBULATORY_CARE_PROVIDER_SITE_OTHER): Payer: Self-pay

## 2022-02-25 NOTE — Telephone Encounter (Signed)
? ?  Diagnosis:  Anemia ? ? ?Result(s) ? ? Card 1: Negative:  ?  ? Card 2: Positive: ? ? Card 3: Negative: ? ? ? ?Completed by: Kourtlynn Trevor ? ? ?HEMOCCULT SENSA DEVELOPER: LOT#: [id number]  EXPIRATION DATE: 05/2023 ? ? ?HEMOCCULT SENSA CARD:  LOT#: [id number]  EXPIRATION DATE: 01/2023 ? ? ?CARD CONTROL RESULTS:  POSITIVE: + NEGATIVE: - ? ? ? ?ADDITIONAL COMMENTS:  Forwarded this result to [facility] ?

## 2022-02-26 DIAGNOSIS — I129 Hypertensive chronic kidney disease with stage 1 through stage 4 chronic kidney disease, or unspecified chronic kidney disease: Secondary | ICD-10-CM | POA: Diagnosis not present

## 2022-02-26 DIAGNOSIS — D638 Anemia in other chronic diseases classified elsewhere: Secondary | ICD-10-CM | POA: Diagnosis not present

## 2022-02-26 DIAGNOSIS — I5032 Chronic diastolic (congestive) heart failure: Secondary | ICD-10-CM | POA: Diagnosis not present

## 2022-02-26 DIAGNOSIS — N1832 Chronic kidney disease, stage 3b: Secondary | ICD-10-CM | POA: Diagnosis not present

## 2022-02-26 DIAGNOSIS — R809 Proteinuria, unspecified: Secondary | ICD-10-CM | POA: Diagnosis not present

## 2022-03-03 ENCOUNTER — Telehealth (INDEPENDENT_AMBULATORY_CARE_PROVIDER_SITE_OTHER): Payer: Medicare Other | Admitting: Gastroenterology

## 2022-03-03 DIAGNOSIS — H6503 Acute serous otitis media, bilateral: Secondary | ICD-10-CM | POA: Diagnosis not present

## 2022-03-03 DIAGNOSIS — H906 Mixed conductive and sensorineural hearing loss, bilateral: Secondary | ICD-10-CM | POA: Diagnosis not present

## 2022-03-03 NOTE — Telephone Encounter (Signed)
Patient's niece [name] ( on [place]) states her main concern is that her hemoglobin was starting to drop again. She dropped off bw from kidney specialist where hemoglobin was 9.1 done after bw in epic. Discussed with her that chelsea was going to talk with dr Laural Golden.  ?

## 2022-03-03 NOTE — Telephone Encounter (Signed)
Patients niece would like someone to give her a call regarding patients hemoglobin - ph# [phone]  ?

## 2022-03-03 NOTE — Telephone Encounter (Signed)
Per Jane Andrews she will have to speak with Dr. Laural Andrews regarding this as patient did have a positive stool card the other two were negative,but labs are stable. Jane Andrews made the niece aware.  ?Patients niece would like someone to give her a call regarding patients hemoglobin - ph# [phone]  ?

## 2022-03-05 DIAGNOSIS — H4921 Sixth [abducent] nerve palsy, right eye: Secondary | ICD-10-CM | POA: Diagnosis not present

## 2022-03-05 DIAGNOSIS — Z961 Presence of intraocular lens: Secondary | ICD-10-CM | POA: Diagnosis not present

## 2022-03-05 DIAGNOSIS — H532 Diplopia: Secondary | ICD-10-CM | POA: Diagnosis not present

## 2022-03-17 DIAGNOSIS — E538 Deficiency of other specified B group vitamins: Secondary | ICD-10-CM | POA: Diagnosis not present

## 2022-03-18 ENCOUNTER — Other Ambulatory Visit (INDEPENDENT_AMBULATORY_CARE_PROVIDER_SITE_OTHER): Payer: Self-pay

## 2022-03-18 DIAGNOSIS — D509 Iron deficiency anemia, unspecified: Secondary | ICD-10-CM

## 2022-03-18 DIAGNOSIS — R195 Other fecal abnormalities: Secondary | ICD-10-CM

## 2022-03-19 ENCOUNTER — Encounter (INDEPENDENT_AMBULATORY_CARE_PROVIDER_SITE_OTHER): Payer: Self-pay

## 2022-03-24 DIAGNOSIS — D638 Anemia in other chronic diseases classified elsewhere: Secondary | ICD-10-CM | POA: Diagnosis not present

## 2022-03-24 DIAGNOSIS — I129 Hypertensive chronic kidney disease with stage 1 through stage 4 chronic kidney disease, or unspecified chronic kidney disease: Secondary | ICD-10-CM | POA: Diagnosis not present

## 2022-03-24 DIAGNOSIS — I5032 Chronic diastolic (congestive) heart failure: Secondary | ICD-10-CM | POA: Diagnosis not present

## 2022-03-24 DIAGNOSIS — N1832 Chronic kidney disease, stage 3b: Secondary | ICD-10-CM | POA: Diagnosis not present

## 2022-03-27 ENCOUNTER — Ambulatory Visit (INDEPENDENT_AMBULATORY_CARE_PROVIDER_SITE_OTHER): Payer: Medicare Other | Admitting: Physician Assistant

## 2022-03-27 VITALS — BP 117/73 | HR 82 | Ht 61.0 in | Wt 122.0 lb

## 2022-03-27 DIAGNOSIS — R309 Painful micturition, unspecified: Secondary | ICD-10-CM

## 2022-03-27 DIAGNOSIS — I129 Hypertensive chronic kidney disease with stage 1 through stage 4 chronic kidney disease, or unspecified chronic kidney disease: Secondary | ICD-10-CM | POA: Diagnosis not present

## 2022-03-27 DIAGNOSIS — N170 Acute kidney failure with tubular necrosis: Secondary | ICD-10-CM | POA: Diagnosis not present

## 2022-03-27 DIAGNOSIS — I5032 Chronic diastolic (congestive) heart failure: Secondary | ICD-10-CM | POA: Diagnosis not present

## 2022-03-27 DIAGNOSIS — N3000 Acute cystitis without hematuria: Secondary | ICD-10-CM

## 2022-03-27 DIAGNOSIS — N1832 Chronic kidney disease, stage 3b: Secondary | ICD-10-CM | POA: Diagnosis not present

## 2022-03-27 DIAGNOSIS — R809 Proteinuria, unspecified: Secondary | ICD-10-CM | POA: Diagnosis not present

## 2022-03-27 DIAGNOSIS — D638 Anemia in other chronic diseases classified elsewhere: Secondary | ICD-10-CM | POA: Diagnosis not present

## 2022-03-27 LAB — URINALYSIS, ROUTINE W REFLEX MICROSCOPIC
Bilirubin, UA: NEGATIVE
Glucose, UA: NEGATIVE
Ketones, UA: NEGATIVE
Nitrite, UA: POSITIVE — AB
RBC, UA: NEGATIVE
Specific Gravity, UA: 1.02 (ref 1.005–1.030)
Urobilinogen, Ur: 0.2 mg/dL (ref 0.2–1.0)
pH, UA: 5.5 (ref 5.0–7.5)

## 2022-03-27 LAB — BLADDER SCAN AMB NON-IMAGING: Scan Result: 28

## 2022-03-27 MED ORDER — AMOXICILLIN-POT CLAVULANATE 875-125 MG PO TABS: 1.0000 | ORAL_TABLET | Freq: Two times a day (BID) | ORAL | 0 refills | Status: DC

## 2022-03-27 NOTE — Progress Notes (Signed)
? ?Assessment: ?1. Pain with urination   ?2. Acute cystitis without hematuria   ? ? ?Plan: ?Will reschedule pt's cystoscopy appointment for 2 to 3 weeks.  She was unable to give enough specimen for microscopic and culture, urine is sent for culture and she is treated with Augmentin as she has tolerated well in the past. Will adjust therapy if indicated by culture results.  Continue AZO as needed for discomfort and increase fluid intake as discussed. ? ?Chief Complaint: ?Dysuria ? ?HPI: ?Jane Andrews is a 88 y.o. female who presents for evaluation of dysuria which has been ongoing, but worse over the past few days.  She continues to have urgency, frequency, occasional burning as on previous exam.  No gross hematuria.  She denies fever, chills, abdominal pain, nausea or vomiting.. ? ?MDX on 01/07/22: No organisms.   ? ?Significant history since last office visit, the patient was hospitalized on 02/11/2022 with SIRS, pansinusitis, symptomatic anemia during that time she was to follow-up for cystoscopic exam. ? ?UA = urine is nitrite positive with 1+ leukocytes.  3+ blood and 2+ protein.  Microscopic exam not performed as patient was unable to give enough of a specimen for microscopic and culture. ?PVR =62m ? ?01/07/22 ?Jane RAUDAis a 88y.o. year old female who is seen in consultation from FRedmond School MD for evaluation of dysuria.  She had onset of symptoms approximately 2 months ago.  She initially noted some redness and swelling of the vaginal area.  She was treated for vulvovaginitis with topical therapy and this improved.  She continued to have dysuria with each void.  No significant frequency.  She has nocturia x3.  She also reports both stress and urge incontinence.  No gross hematuria, flank pain, or low back pain. ?Urinalysis from 10/27/2021: 21-50 RBCs, 21-50 WBCs, rare bacteria ?Urinalysis from 12/21/2021: >30 WBCs, 3-10 RBCs, >10 epithelial cells, few bacteria ?Urine culture from 12/21/2021:  50-100 K mixed flora ?  ?CT abdomen and pelvis from 9/21 showed a large staghorn calculus within the right kidney, right renal cortical atrophy, probable proteinaceous/hemorrhagic cyst in the lower pole of the right kidney, large simple appearing cyst upper pole left kidney, 3 mm nonobstructing calculus lower pole left kidney. ?Portions of the above documentation were copied from a prior visit for review purposes only. ? ?Allergies: ?Allergies  ?Allergen Reactions  ? Codeine Nausea And Vomiting  ? ? ?PMH: ?Past Medical History:  ?Diagnosis Date  ? Abdominal adhesions 1994  ? Allergic rhinitis   ? Anemia   ? Anxiety and depression   ? Aortic stenosis   ? Arthritis   ? Atrial fibrillation ([place]  ? Associated with severe anemia and esophageal pill impaction  ? Breast carcinoma ([place]   ? Right mastectomy  ? Cholelithiasis   ? Essential hypertension   ? Gastroesophageal reflux disease   ? Hiatal hernia  ? History of blood transfusion   ? Hypothyroidism   ? Low back pain   ? Malabsorption   ? 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 Nephrolithiasis 2004  ? Painless hematuria  ? Short gut syndrome   ? Bowel resection , 2004  ? Upper GI bleed 2004  ? Multiple episodes of melena-? due to gastritis or adverse drug effect (nonsteroidals, small bowel ulceration with Fosamax); caused by Pepto-Bismol during one Emergency Department evaluation  ? ? ?PSH: ?Past Surgical History:  ?Procedure Laterality Date  ? ABDOMINAL HYSTERECTOMY    ?  emergency s/p delivery  ? ABDOMINAL HYSTERECTOMY  1960  ? massive gynecologic bleeding  ? BOWEL RESECTION    ? Resulting short gut syndrome  ? CHOLECYSTECTOMY N/A 06/25/2018  ? Procedure: LAPAROSCOPIC CHOLECYSTECTOMY WITH INTRAOPERATIVE CHOLANGIOGRAM;  Surgeon: Armandina Gemma, MD;  Location: [facility];  Service: General;  Laterality: N/A;  ? COLONOSCOPY W/ POLYPECTOMY  2005  ? Lipoma; diverticulosis  ? COLONOSCOPY WITH ESOPHAGOGASTRODUODENOSCOPY (EGD)  11/22/2012  ? [facility]   ? HIP ARTHROPLASTY Right 06/15/2020  ? Procedure: ANTERIOR ARTHROPLASTY BIPOLAR HIP (HEMIARTHROPLASTY);  Surgeon: Mcarthur Rossetti, MD;  Location: [facility];  Service: Orthopedics;  Laterality: Right;  ? [place]  ? s/p adhesions  ? MASTECTOMY  right breast  ? MASTECTOMY    ? Carcinoma of the breast; right  ? UPPER GASTROINTESTINAL ENDOSCOPY    ? ? ?SH: ?Social History  ? ?Tobacco Use  ? Smoking status: Former  ?  Packs/day: 1.50  ?  Years: 20.00  ?  Pack years: 30.00  ?  Types: Cigarettes  ? Smokeless tobacco: Never  ?Vaping Use  ? Vaping Use: Never used  ?Substance Use Topics  ? Alcohol use: No  ? Drug use: No  ? ? ?ROS: ?Constitutional:  Negative for fever, chills, weight loss ?CV: Negative for chest pain ?Respiratory:  Negative for shortness of breath, wheezing, sleep apnea, frequent cough ?GI:  Negative for nausea, vomiting, bloody stool, GERD ? ?PE: ?BP 117/73   Pulse 82   Ht '5\' 1"'$  (1.549 m)   Wt 122 lb (55.3 kg)   BMI 23.05 kg/m?  ?GENERAL APPEARANCE:  Well appearing, well developed, well nourished, NAD ?HEENT:  Atraumatic, normocephalic ?NECK:  Supple. Trachea midline ?ABDOMEN:  Soft, non-tender, no masses ?EXTREMITIES:  Moves all extremities well without edema ?NEUROLOGIC:  Alert and oriented x 3, slow gait ?MENTAL STATUS:  appropriate ?BACK:  Non-tender to palpation, No CVAT ?SKIN:  Warm, dry, and intact ? ? ?Results: ?Laboratory Data: ?Lab Results  ?Component Value Date  ? WBC 10.4 02/18/2022  ? HGB 10.1 (L) 02/18/2022  ? HCT 31.1 (L) 02/18/2022  ? MCV 92.0 02/18/2022  ? PLT 242 02/18/2022  ? ? ?Lab Results  ?Component Value Date  ? CREATININE 1.79 (H) 02/11/2022  ? ? ? ?Lab Results  ?Component Value Date  ? HGBA1C 5.1 10/21/2012  ? ? ?Urinalysis ?   ?Component Value Date/Time  ? [place] YELLOW 10/27/2021 1159  ? APPEARANCEUR Cloudy (A) 01/07/2022 1625  ? LABSPEC 1.005 10/27/2021 1159  ? PHURINE 6.0 10/27/2021 1159  ? GLUCOSEU Negative 01/07/2022 1625  ? HGBUR  MODERATE (A) 10/27/2021 1159  ? BILIRUBINUR Negative 01/07/2022 1625  ? [place] NEGATIVE 10/27/2021 1159  ? PROTEINUR 2+ (A) 01/07/2022 1625  ? PROTEINUR NEGATIVE 10/27/2021 1159  ? UROBILINOGEN 0.2 01/16/2015 0848  ? NITRITE Negative 01/07/2022 1625  ? NITRITE NEGATIVE 10/27/2021 1159  ? LEUKOCYTESUR 1+ (A) 01/07/2022 1625  ? LEUKOCYTESUR SMALL (A) 10/27/2021 1159  ? ? ?Lab Results  ?Component Value Date  ? LABMICR See below: 01/07/2022  ? [place] 6-10 (A) 01/07/2022  ? LABEPIT 0-10 01/07/2022  ? BACTERIA Moderate (A) 01/07/2022  ? ? ?Pertinent Imaging: ?Results for orders placed during the hospital encounter of 10/16/04 ? ?DG Abd 1 View ? ?Narrative ?Clinical Data: Post ESL. ?ONE VIEW ABDOMEN - 10/16/2004 ?Comparison: 10/09/2004. ?No definite ureteral calculi are visible by plain film on today's study. There are vague upper pole right renal calculi noted. Stool and gas overlie the right kidney  as well. L3-4 degenerative disk space narrowing noted. ?IMPRESSION ?No definite ureteral calculi demonstrable by plain film. Upper pole right renal calculi noted. ? ?Provider: Jennye Boroughs ? ?Results for orders placed during the hospital encounter of 03/12/16 ? ?US Venous Img Lower Bilateral ? ?Narrative ?CLINICAL DATA:  Bilateral lower extremity pain and edema for the ?past 2 weeks. History of breast cancer. History of varicose veins. ?Former smoker. ? ?EXAM: ?BILATERAL LOWER EXTREMITY VENOUS DOPPLER ULTRASOUND ? ?TECHNIQUE: ?Gray-scale sonography with graded compression, as well as color ?Doppler and duplex ultrasound were performed to evaluate the lower ?extremity deep venous systems from the level of the common femoral ?vein and including the common femoral, femoral, profunda femoral, ?popliteal and calf veins including the posterior tibial, peroneal ?and gastrocnemius veins when visible. The superficial great ?saphenous vein was also interrogated. Spectral Doppler was utilized ?to evaluate flow at rest and with distal  augmentation maneuvers in ?the common femoral, femoral and popliteal veins. ? ?COMPARISON:  None. ? ?FINDINGS: ?RIGHT LOWER EXTREMITY ? ?Common Femoral Vein: No evidence of thrombus. Normal ?compressibility, res

## 2022-03-27 NOTE — Progress Notes (Signed)
post void residual=28 

## 2022-03-27 NOTE — Patient Instructions (Signed)
Take AZO over the counter as needed ?

## 2022-03-29 LAB — URINE CULTURE

## 2022-03-31 ENCOUNTER — Telehealth: Payer: Self-pay

## 2022-03-31 NOTE — Telephone Encounter (Signed)
Patient called and made aware.

## 2022-04-01 DIAGNOSIS — H6503 Acute serous otitis media, bilateral: Secondary | ICD-10-CM | POA: Diagnosis not present

## 2022-04-01 NOTE — Patient Instructions (Signed)
? ? ? ? ? ? Jane Andrews ? 04/01/2022  ?  ? '@PREFPERIOPPHARMACY'$ @ ? ? Your procedure is scheduled on  04/04/2022. ? ? Report to [facility] at  1150  A.M. ? ? Call this number if you have problems the morning of surgery: ? [phone] ? ? Remember: ? Follow the diet instructions given to you by the office. ?  ? Take these medicines the morning of surgery with A SIP OF WATER  ? ?     cymbalta, levothyroxine, metoprolol, tramadol(if needed). ?  ? ? Do not wear jewelry, make-up or nail polish. ? Do not wear lotions, powders, or perfumes, or deodorant. ? Do not shave 48 hours prior to surgery.  Men may shave face and neck. ? Do not bring valuables to the hospital. ? [facility] is not responsible for any belongings or valuables. ? ?Contacts, dentures or bridgework may not be worn into surgery.  Leave your suitcase in the car.  After surgery it may be brought to your room. ? ?For patients admitted to the hospital, discharge time will be determined by your treatment team. ? ?Patients discharged the day of surgery will not be allowed to drive home and must have someone with them for 24 hours.  ? ? ?Special instructions:   DO NOT smoke tobacco or vape for 24 hours before your procedure. ? ?Please read over the following fact sheets that you were given. ?Anesthesia Post-op Instructions and Care and Recovery After Surgery ?  ? ? ? Upper Endoscopy, Adult, Care After ?This sheet gives you information about how to care for yourself after your procedure. Your health care provider may also give you more specific instructions. If you have problems or questions, contact your health care provider. ?What can I expect after the procedure? ?After the procedure, it is common to have: ?A sore throat. ?Mild stomach pain or discomfort. ?Bloating. ?Nausea. ?Follow these instructions at home: ? ?Follow instructions from your health care provider about what to eat or drink after your procedure. ?Return to your normal activities as told by  your health care provider. Ask your health care provider what activities are safe for you. ?Take over-the-counter and prescription medicines only as told by your health care provider. ?If you were given a sedative during the procedure, it can affect you for several hours. Do not drive or operate machinery until your health care provider says that it is safe. ?Keep all follow-up visits as told by your health care provider. This is important. ?Contact a health care provider if you have: ?A sore throat that lasts longer than one day. ?Trouble swallowing. ?Get help right away if: ?You vomit blood or your vomit looks like coffee grounds. ?You have: ?A fever. ?Bloody, black, or tarry stools. ?A severe sore throat or you cannot swallow. ?Difficulty breathing. ?Severe pain in your chest or abdomen. ?Summary ?After the procedure, it is common to have a sore throat, mild stomach discomfort, bloating, and nausea. ?If you were given a sedative during the procedure, it can affect you for several hours. Do not drive or operate machinery until your health care provider says that it is safe. ?Follow instructions from your health care provider about what to eat or drink after your procedure. ?Return to your normal activities as told by your health care provider. ?This information is not intended to replace advice given to you by your health care provider. Make sure you discuss any questions you have with your health care provider. ?Document  Revised: 10/07/2019 Document Reviewed: 05/03/2018 ?Elsevier Patient Education ? [place]. ?Monitored Anesthesia Care, Care After ?This sheet gives you information about how to care for yourself after your procedure. Your health care provider may also give you more specific instructions. If you have problems or questions, contact your health care provider. ?What can I expect after the procedure? ?After the procedure, it is common to have: ?Tiredness. ?Forgetfulness about what happened  after the procedure. ?Impaired judgment for important decisions. ?Nausea or vomiting. ?Some difficulty with balance. ?Follow these instructions at home: ?For the time period you were told by your health care provider: ? ?  ? ?Rest as needed. ?Do not participate in activities where you could fall or become injured. ?Do not drive or use machinery. ?Do not drink alcohol. ?Do not take sleeping pills or medicines that cause drowsiness. ?Do not make important decisions or sign legal documents. ?Do not take care of children on your own. ?Eating and drinking ?Follow the diet that is recommended by your health care provider. ?Drink enough fluid to keep your urine pale yellow. ?If you vomit: ?Drink water, juice, or soup when you can drink without vomiting. ?Make sure you have little or no nausea before eating solid foods. ?General instructions ?Have a responsible adult stay with you for the time you are told. It is important to have someone help care for you until you are awake and alert. ?Take over-the-counter and prescription medicines only as told by your health care provider. ?If you have sleep apnea, surgery and certain medicines can increase your risk for breathing problems. Follow instructions from your health care provider about wearing your sleep device: ?Anytime you are sleeping, including during daytime naps. ?While taking prescription pain medicines, sleeping medicines, or medicines that make you drowsy. ?Avoid smoking. ?Keep all follow-up visits as told by your health care provider. This is important. ?Contact a health care provider if: ?You keep feeling nauseous or you keep vomiting. ?You feel light-headed. ?You are still sleepy or having trouble with balance after 24 hours. ?You develop a rash. ?You have a fever. ?You have redness or swelling around the IV site. ?Get help right away if: ?You have trouble breathing. ?You have new-onset confusion at home. ?Summary ?For several hours after your procedure, you may  feel tired. You may also be forgetful and have poor judgment. ?Have a responsible adult stay with you for the time you are told. It is important to have someone help care for you until you are awake and alert. ?Rest as told. Do not drive or operate machinery. Do not drink alcohol or take sleeping pills. ?Get help right away if you have trouble breathing, or if you suddenly become confused. ?This information is not intended to replace advice given to you by your health care provider. Make sure you discuss any questions you have with your health care provider. ?Document Revised: 11/05/2021 Document Reviewed: 11/03/2019 ?Elsevier Patient Education ? [place]. ? ?

## 2022-04-02 ENCOUNTER — Encounter (HOSPITAL_COMMUNITY)
Admission: RE | Admit: 2022-04-02 | Discharge: 2022-04-02 | Disposition: A | Discharge status: Home / Self Care | Payer: Medicare Other | Source: Ambulatory Visit | Attending: Internal Medicine | Admitting: Internal Medicine

## 2022-04-02 DIAGNOSIS — D509 Iron deficiency anemia, unspecified: Secondary | ICD-10-CM | POA: Insufficient documentation

## 2022-04-02 DIAGNOSIS — D649 Anemia, unspecified: Secondary | ICD-10-CM

## 2022-04-02 DIAGNOSIS — Z01818 Encounter for other preprocedural examination: Secondary | ICD-10-CM | POA: Diagnosis not present

## 2022-04-02 DIAGNOSIS — N1832 Chronic kidney disease, stage 3b: Secondary | ICD-10-CM | POA: Insufficient documentation

## 2022-04-02 LAB — CBC WITH DIFFERENTIAL/PLATELET
Abs Immature Granulocytes: 0.01 10*3/uL (ref 0.00–0.07)
Basophils Absolute: 0 10*3/uL (ref 0.0–0.1)
Basophils Relative: 1 %
Eosinophils Absolute: 0.7 10*3/uL — ABNORMAL HIGH (ref 0.0–0.5)
Eosinophils Relative: 9 %
HCT: 33.3 % — ABNORMAL LOW (ref 36.0–46.0)
Hemoglobin: 10.4 g/dL — ABNORMAL LOW (ref 12.0–15.0)
Immature Granulocytes: 0 %
Lymphocytes Relative: 28 %
Lymphs Abs: 2.1 10*3/uL (ref 0.7–4.0)
MCH: 29.2 pg (ref 26.0–34.0)
MCHC: 31.2 g/dL (ref 30.0–36.0)
MCV: 93.5 fL (ref 80.0–100.0)
Monocytes Absolute: 0.8 10*3/uL (ref 0.1–1.0)
Monocytes Relative: 10 %
Neutro Abs: 4 10*3/uL (ref 1.7–7.7)
Neutrophils Relative %: 52 %
Platelets: 121 10*3/uL — ABNORMAL LOW (ref 150–400)
RBC: 3.56 MIL/uL — ABNORMAL LOW (ref 3.87–5.11)
RDW: 13.5 % (ref 11.5–15.5)
WBC: 7.6 10*3/uL (ref 4.0–10.5)
nRBC: 0 % (ref 0.0–0.2)

## 2022-04-02 LAB — BASIC METABOLIC PANEL
Anion gap: 8 (ref 5–15)
BUN: 53 mg/dL — ABNORMAL HIGH (ref 8–23)
CO2: 18 mmol/L — ABNORMAL LOW (ref 22–32)
Calcium: 9.6 mg/dL (ref 8.9–10.3)
Chloride: 113 mmol/L — ABNORMAL HIGH (ref 98–111)
Creatinine, Ser: 1.98 mg/dL — ABNORMAL HIGH (ref 0.44–1.00)
GFR, Estimated: 23 mL/min — ABNORMAL LOW (ref >60)
Glucose, Bld: 108 mg/dL — ABNORMAL HIGH (ref 70–99)
Potassium: 6.2 mmol/L — ABNORMAL HIGH (ref 3.5–5.1)
Sodium: 139 mmol/L (ref 135–145)

## 2022-04-03 ENCOUNTER — Telehealth (INDEPENDENT_AMBULATORY_CARE_PROVIDER_SITE_OTHER): Payer: Self-pay

## 2022-04-03 ENCOUNTER — Telehealth (INDEPENDENT_AMBULATORY_CARE_PROVIDER_SITE_OTHER): Payer: Self-pay | Admitting: *Deleted

## 2022-04-03 MED ORDER — SODIUM POLYSTYRENE SULFONATE PO POWD: Freq: Once | ORAL | 0 refills | Status: DC

## 2022-04-03 MED ORDER — SODIUM POLYSTYRENE SULFONATE PO POWD: Freq: Once | ORAL | 0 refills | Status: AC

## 2022-04-03 NOTE — Telephone Encounter (Signed)
-----   Message from Wilmer Floor, RN sent at 04/03/2022  7:50 AM EDT ----- ?Regarding: K 6.2 ?Hey Dr Laural Golden, ? ?Ms Handler had labs yesterday and K+ 6.2.  She is for an EGD on 04/04/22.  ? Dr Briant Cedar wants it below 6.   ? ?Thanks, ? ?Rosalyn Gess RN ? ? ?

## 2022-04-03 NOTE — Telephone Encounter (Signed)
Ok to change per dr Laural Golden and he did send in script. I called [facility] to make sure pt could get today since having procedure tomorrow and pharm did not receive rx but I gave verbal order and pharm told me they would get ready for her. I called pt and let her know she could pick up today.  ?

## 2022-04-03 NOTE — Telephone Encounter (Signed)
Tammy from [facility] called. Rx for kayexalate was sent to them and they do not carry this med. She called [facility] and they do have it in suspension form but not for powder. Can it be changed and sent to [facility].  ? ?Tammy at [facility]. [phone].  ?

## 2022-04-03 NOTE — Progress Notes (Signed)
Dr Laural Golden notified of K+ 6.2.  He will give a dose of Kayexalate 25 gms po and repeat K+ on arrival ?

## 2022-04-03 NOTE — Telephone Encounter (Signed)
Kayexalate 25 g has been sent to her pharmacy and she is aware to pick up and take today and is aware she will be rechecked in the morning  ?

## 2022-04-04 ENCOUNTER — Ambulatory Visit (HOSPITAL_BASED_OUTPATIENT_CLINIC_OR_DEPARTMENT_OTHER): Payer: Medicare Other | Admitting: Certified Registered Nurse Anesthetist

## 2022-04-04 ENCOUNTER — Ambulatory Visit (HOSPITAL_COMMUNITY): Payer: Medicare Other | Admitting: Certified Registered Nurse Anesthetist

## 2022-04-04 ENCOUNTER — Ambulatory Visit (HOSPITAL_COMMUNITY)
Admission: RE | Admit: 2022-04-04 | Discharge: 2022-04-04 | Disposition: A | Discharge status: Home / Self Care | Payer: Medicare Other | Attending: Internal Medicine | Admitting: Internal Medicine

## 2022-04-04 ENCOUNTER — Encounter (HOSPITAL_COMMUNITY)
Admission: RE | Disposition: A | Discharge status: Home / Self Care | Payer: Self-pay | Source: Home / Self Care | Attending: Internal Medicine

## 2022-04-04 ENCOUNTER — Encounter (HOSPITAL_COMMUNITY): Payer: Self-pay | Admitting: Internal Medicine

## 2022-04-04 DIAGNOSIS — E039 Hypothyroidism, unspecified: Secondary | ICD-10-CM | POA: Diagnosis not present

## 2022-04-04 DIAGNOSIS — K449 Diaphragmatic hernia without obstruction or gangrene: Secondary | ICD-10-CM

## 2022-04-04 DIAGNOSIS — K3189 Other diseases of stomach and duodenum: Secondary | ICD-10-CM | POA: Diagnosis not present

## 2022-04-04 DIAGNOSIS — Z79899 Other long term (current) drug therapy: Secondary | ICD-10-CM | POA: Insufficient documentation

## 2022-04-04 DIAGNOSIS — D500 Iron deficiency anemia secondary to blood loss (chronic): Secondary | ICD-10-CM | POA: Diagnosis not present

## 2022-04-04 DIAGNOSIS — N1832 Chronic kidney disease, stage 3b: Secondary | ICD-10-CM

## 2022-04-04 DIAGNOSIS — K219 Gastro-esophageal reflux disease without esophagitis: Secondary | ICD-10-CM | POA: Insufficient documentation

## 2022-04-04 DIAGNOSIS — F418 Other specified anxiety disorders: Secondary | ICD-10-CM | POA: Insufficient documentation

## 2022-04-04 DIAGNOSIS — K766 Portal hypertension: Secondary | ICD-10-CM | POA: Insufficient documentation

## 2022-04-04 DIAGNOSIS — K2289 Other specified disease of esophagus: Secondary | ICD-10-CM | POA: Insufficient documentation

## 2022-04-04 DIAGNOSIS — K31819 Angiodysplasia of stomach and duodenum without bleeding: Secondary | ICD-10-CM | POA: Diagnosis not present

## 2022-04-04 DIAGNOSIS — D509 Iron deficiency anemia, unspecified: Secondary | ICD-10-CM

## 2022-04-04 DIAGNOSIS — R195 Other fecal abnormalities: Secondary | ICD-10-CM

## 2022-04-04 DIAGNOSIS — I10 Essential (primary) hypertension: Secondary | ICD-10-CM | POA: Diagnosis not present

## 2022-04-04 DIAGNOSIS — Z87891 Personal history of nicotine dependence: Secondary | ICD-10-CM | POA: Insufficient documentation

## 2022-04-04 HISTORY — PX: ESOPHAGOGASTRODUODENOSCOPY (EGD) WITH PROPOFOL: SHX5813

## 2022-04-04 LAB — BASIC METABOLIC PANEL
Anion gap: 11 (ref 5–15)
BUN: 52 mg/dL — ABNORMAL HIGH (ref 8–23)
CO2: 18 mmol/L — ABNORMAL LOW (ref 22–32)
Calcium: 9.4 mg/dL (ref 8.9–10.3)
Chloride: 110 mmol/L (ref 98–111)
Creatinine, Ser: 1.79 mg/dL — ABNORMAL HIGH (ref 0.44–1.00)
GFR, Estimated: 26 mL/min — ABNORMAL LOW (ref >60)
Glucose, Bld: 103 mg/dL — ABNORMAL HIGH (ref 70–99)
Potassium: 4.7 mmol/L (ref 3.5–5.1)
Sodium: 139 mmol/L (ref 135–145)

## 2022-04-04 SURGERY — ESOPHAGOGASTRODUODENOSCOPY (EGD) WITH PROPOFOL
Anesthesia: General

## 2022-04-04 MED ORDER — LACTATED RINGERS IV SOLN: INTRAVENOUS | Status: DC | PRN

## 2022-04-04 MED ORDER — GLYCOPYRROLATE PF 0.2 MG/ML IJ SOSY
PREFILLED_SYRINGE | INTRAMUSCULAR | Status: AC
  Filled 2022-04-04: qty 1

## 2022-04-04 MED ORDER — PROPOFOL 500 MG/50ML IV EMUL
INTRAVENOUS | Status: DC | PRN
  Administered 2022-04-04: 50 ug/kg/min via INTRAVENOUS

## 2022-04-04 MED ORDER — GLYCOPYRROLATE PF 0.2 MG/ML IJ SOSY
PREFILLED_SYRINGE | INTRAMUSCULAR | Status: DC | PRN
  Administered 2022-04-04: .2 mg via INTRAVENOUS

## 2022-04-04 MED ORDER — LACTATED RINGERS IV SOLN: INTRAVENOUS | Status: DC

## 2022-04-04 MED ORDER — PROPOFOL 10 MG/ML IV BOLUS
INTRAVENOUS | Status: DC | PRN
Start: 2022-04-04 — End: 2022-04-04
  Administered 2022-04-04: 50 mg via INTRAVENOUS

## 2022-04-04 NOTE — Op Note (Signed)
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                          Technician ?Referring MD:             Redmond School, MD ?Medicines:                Propofol per Anesthesia ?Complications:            No immediate complications. ?Estimated Blood Loss:     Estimated blood loss: none. ?Procedure:                Pre-Anesthesia Assessment: ?                          - Prior to the procedure, a History and Physical  ?                          was performed, and patient medications and  ?                          allergies were reviewed. The patient's tolerance of  ?                          previous anesthesia was also reviewed. The risks  ?                          and benefits of the procedure and the sedation  ?                          options and risks were discussed with the patient.  ?                          All questions were answered, and informed consent  ?                          was obtained. Prior Anticoagulants: The patient has  ?                          taken no previous anticoagulant or antiplatelet  ?                          agents. ASA Grade Assessment: III - A patient with  ?  severe systemic disease. After reviewing the risks  ?                          and benefits, the patient was deemed in  ?                          satisfactory condition to undergo the procedure. ?                          After obtaining informed consent, the endoscope was  ?                          passed under direct vision. Throughout the  ?                          procedure, the patient's blood  pressure, pulse, and  ?                          oxygen saturations were monitored continuously. The  ?                          GIF-H190 ([id number]) scope was introduced through the  ?                          mouth, and advanced to the second part of duodenum.  ?                          The upper GI endoscopy was accomplished without  ?                          difficulty. The patient tolerated the procedure  ?                          well. ?Scope In: 1:07:13 PM ?Scope Out: 1:11:30 PM ?Total Procedure Duration: 0 hours 4 minutes 17 seconds  ?Findings: ?     The hypopharynx was normal. ?     The examined esophagus was normal. ?     The Z-line was irregular and was found 35 cm from the incisors. ?     A 2 cm hiatal hernia was present. ?     Mild portal hypertensive gastropathy was found in the gastric fundus. ?     Mild gastric antral vascular ectasia without bleeding was present in the  ?     gastric antrum and in the prepyloric region of the stomach. ?     The exam of the stomach was otherwise normal. ?     The duodenal bulb, second portion of the duodenum and third portion of  ?     the duodenum were normal. ?Impression:               - Normal hypopharynx. ?                          - Normal esophagus. ?                          -  Z-line irregular, 35 cm from the incisors. ?                          - 2 cm hiatal hernia. ?                          - Portal hypertensive gastropathy. ?                          - Gastric antral vascular ectasia without bleeding. ?                          - Normal duodenal bulb, second portion of the  ?                          duodenum and third portion of the duodenum. ?                          - No specimens collected. ?Moderate Sedation: ?     Per Anesthesia Care ?Recommendation:           - Patient has a contact number available for  ?                          emergencies. The signs and symptoms of potential  ?                          delayed complications were discussed with  the  ?                          patient. Return to normal activities tomorrow.  ?                          Written discharge instructions were provided to the  ?                          patient. ?                          - Resume previous diet today. ?                          - Continue present medications. ?                          - No aspirin, ibuprofen, naproxen, or other  ?                          non-steroidal anti-inflammatory drugs. ?                          - Will recheck H&H 2 weeks. No further work-up  ?                          unless she has gross GI bleed and no need for blood  ?  transfusion. ?Procedure Code(s):        --- Professional --- ?                          [phone], Esophagogastroduodenoscopy, flexible,  ?                          transoral; diagnostic, including collection of  ?                          specimen(s) by brushing or washing, when performed  ?                          (separate procedure) ?Diagnosis Code(s):        --- Professional --- ?                          K22.8, Other specified diseases of esophagus ?                          K44.9, Diaphragmatic hernia without obstruction or  ?                          gangrene ?                          K76.6, Portal hypertension ?                          K31.89, Other diseases of stomach and duodenum ?                          K31.819, Angiodysplasia of stomach and duodenum  ?                          without bleeding ?                          D50.0, Iron deficiency anemia secondary to blood  ?                          loss (chronic) ?CPT copyright 2019 American Medical Association. All rights reserved. ?The codes documented in this report are preliminary and upon coder review may  ?be revised to meet current compliance requirements. ?Hildred Laser, MD ?Hildred Laser, MD ?04/04/2022 1:22:42 PM ?This report has been signed electronically. ?Number of Addenda: 0 ?

## 2022-04-04 NOTE — Anesthesia Postprocedure Evaluation (Signed)
Anesthesia Post Note ? ?Patient: Jane Andrews ? ?Procedure(s) Performed: ESOPHAGOGASTRODUODENOSCOPY (EGD) WITH PROPOFOL ? ?Patient location during evaluation: Phase II ?Anesthesia Type: General ?Level of consciousness: awake ?Pain management: pain level controlled ?Vital Signs Assessment: post-procedure vital signs reviewed and stable ?Respiratory status: spontaneous breathing and respiratory function stable ?Cardiovascular status: blood pressure returned to baseline and stable ?Postop Assessment: no headache and no apparent nausea or vomiting ?Anesthetic complications: no ?Comments: Late entry ? ? ?No notable events documented. ? ? ?Last Vitals:  ?Vitals:  ? 04/04/22 1315 04/04/22 1323  ?BP: (!) 77/39 97/77  ?Pulse:    ?Resp: 15   ?Temp: 36.5 ?C   ?SpO2: 99%   ?  ?Last Pain:  ?Vitals:  ? 04/04/22 1315  ?TempSrc: Oral  ?PainSc: 0-No pain  ? ? ?  ?  ?  ?  ?  ?  ? ?Louann Sjogren ? ? ? ? ?

## 2022-04-04 NOTE — Discharge Instructions (Signed)
Resume usual medications including multivitamin with iron as before. ?Please do not take aspirin Advil Aleve or similar medications. ?Resume usual diet. ?No driving for 24 hours. ?Please notify if you have rectal bleeding or tarry stool. ? ?We will check electrolytes and H&H in 2 weeks.  Office will call. ?

## 2022-04-04 NOTE — H&P (Signed)
Jane Andrews is an 88 y.o. female.   ?Chief Complaint: Patient is here for esophagogastroduodenoscopy. ?HPI: Patient is a 88 year old Caucasian female who was hospitalized back in late February 2023 with acute para sinusitis and SIRS.  She was found to be anemic.  Iron studies confirmed iron deficiency anemia.  She received transfusion.  Patient return for follow-up visit.  About 6 weeks ago.  She recalls her stool has been dark prior to her hospitalization.  However she has not experienced frank rectal bleeding hematemesis vaginal bleeding or hematuria.  She does have a history of iron deficiency anemia but has not been evaluated in about 10 years.  We did 3 Hemoccults and 1 was positive.  We recommended diagnostic esophagogastroduodenoscopy and holding off colonoscopy given lack of symptoms and her age.  She does not take aspirin or OTC NSAIDs.  She is on pantoprazole for chronic GERD.  Heartburn has been well controlled.  She denies dysphagia diarrhea constipation melena or rectal bleeding. ?Patient's preop blood work revealed serum potassium of 6.2.  She was given a dose of Kayexalate and serum potassium today is 4.7. ?Her iron studies and hemoglobin has improved with multivitamin containing iron. ? ?Past Medical History:  ?Diagnosis Date  ? Abdominal adhesions 1994  ? Allergic rhinitis   ? Anemia   ? Anxiety and depression   ? Aortic stenosis   ? Arthritis   ? Atrial fibrillation ([place]) 10/2012  ? Associated with severe anemia and esophageal pill impaction  ? Breast carcinoma ([place])   ? Right mastectomy  ? Cholelithiasis   ? Essential hypertension   ? Gastroesophageal reflux disease   ? Hiatal hernia  ? History of blood transfusion   ? Hypothyroidism   ? Low back pain   ? Malabsorption   ? 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 Nephrolithiasis 2004  ? Painless hematuria  ? Short gut syndrome   ? Bowel resection , 2004  ? Upper GI bleed 2004  ? Multiple episodes of melena-? due to  gastritis or adverse drug effect (nonsteroidals, small bowel ulceration with Fosamax); caused by Pepto-Bismol during one Emergency Department evaluation  ? ? ?Past Surgical History:  ?Procedure Laterality Date  ? ABDOMINAL HYSTERECTOMY    ? emergency s/p delivery  ? ABDOMINAL HYSTERECTOMY  1960  ? massive gynecologic bleeding  ? BOWEL RESECTION    ? Resulting short gut syndrome  ? CHOLECYSTECTOMY N/A 06/25/2018  ? Procedure: LAPAROSCOPIC CHOLECYSTECTOMY WITH INTRAOPERATIVE CHOLANGIOGRAM;  Surgeon: Armandina Gemma, MD;  Location: [facility];  Service: General;  Laterality: N/A;  ? COLONOSCOPY W/ POLYPECTOMY  2005  ? Lipoma; diverticulosis  ? COLONOSCOPY WITH ESOPHAGOGASTRODUODENOSCOPY (EGD)  11/22/2012  ? Minka Knight  ? HIP ARTHROPLASTY Right 06/15/2020  ? Procedure: ANTERIOR ARTHROPLASTY BIPOLAR HIP (HEMIARTHROPLASTY);  Surgeon: Mcarthur Rossetti, MD;  Location: [facility];  Service: Orthopedics;  Laterality: Right;  ? [place]  ? s/p adhesions  ? MASTECTOMY  right breast  ? MASTECTOMY    ? Carcinoma of the breast; right  ? UPPER GASTROINTESTINAL ENDOSCOPY    ? ? ?Family History  ?Problem Relation Age of Onset  ? Anuerysm Father   ? Rheum arthritis Sister   ? Healthy Sister   ? COPD Sister   ? Healthy Brother   ? Cancer Other   ? Colon cancer Neg Hx   ? ?Social History:  reports that she has quit smoking. Her smoking use included cigarettes. She has a  30.00 pack-year smoking history. She has never used smokeless tobacco. She reports that she does not drink alcohol and does not use drugs. ? ?Allergies:  ?Allergies  ?Allergen Reactions  ? Codeine Nausea And Vomiting  ? ? ?Medications Prior to Admission  ?Medication Sig Dispense Refill  ? ALPRAZolam (XANAX) 0.5 MG tablet Take 0.5 mg by mouth at bedtime as needed for anxiety.    ? amoxicillin-clavulanate (AUGMENTIN) 875-125 MG tablet Take 1 tablet by mouth every 12 (twelve) hours. 14 tablet 0  ? Bacillus Coagulans-Inulin (PROBIOTIC FORMULA PO) Take 1  tablet by mouth daily.    ? cyanocobalamin (,VITAMIN B-12,) 1000 MCG/ML injection Inject 1,000 mcg into the muscle every 30 (thirty) days.    ? diphenoxylate-atropine (LOMOTIL) 2.5-0.025 MG tablet Take 1 tablet by mouth 4 (four) times daily as needed for diarrhea or loose stools.    ? DULoxetine (CYMBALTA) 30 MG capsule Take 30 mg by mouth daily. Take with 60 mg for a total of 90 mg daily    ? DULoxetine (CYMBALTA) 60 MG capsule Take 60 mg by mouth See admin instructions. Take with 30 mg for a total of 90 mg daily    ? fluticasone (FLONASE) 50 MCG/ACT nasal spray Place 1 spray into both nostrils 2 (two) times daily. (Patient taking differently: Place 1 spray into both nostrils daily as needed for rhinitis.) 16 g 0  ? levothyroxine (SYNTHROID) 75 MCG tablet Take 1 tablet (75 mcg total) by mouth daily before breakfast. 30 tablet 0  ? metoprolol succinate (TOPROL-XL) 25 MG 24 hr tablet Take 1 tablet (25 mg total) by mouth daily. 30 tablet 0  ? Multiple Vitamins-Iron (MULTIVITAMINS WITH IRON) TABS tablet Take 1 tablet by mouth daily.    ? pantoprazole (PROTONIX) 40 MG tablet Take 1 tablet (40 mg total) by mouth 2 (two) times daily. (Patient taking differently: Take 40 mg by mouth daily.) 60 tablet 0  ? sodium bicarbonate 650 MG tablet Take 650 mg by mouth 2 (two) times daily.    ? sodium polystyrene (KAYEXALATE) 15 GM/60ML suspension Take 25 g by mouth once.    ? traMADol (ULTRAM) 50 MG tablet Take 1 tablet (50 mg total) by mouth every 6 (six) hours. (Patient taking differently: Take 50 mg by mouth every 6 (six) hours as needed for moderate pain or severe pain.) 60 tablet 0  ? vitamin C (ASCORBIC ACID) 500 MG tablet Take 500 mg by mouth daily.    ? VITAMIN D PO Take by mouth daily.    ? ? ?Results for orders placed or performed during the hospital encounter of 04/04/22 (from the past 48 hour(s))  ?Basic metabolic panel     Status: Abnormal  ? Collection Time: 04/04/22 12:06 PM  ?Result Value Ref Range  ? Sodium 139 135  - 145 mmol/L  ? Potassium 4.7 3.5 - 5.1 mmol/L  ?  Comment: DELTA CHECK NOTED  ? Chloride 110 98 - 111 mmol/L  ? CO2 18 (L) 22 - 32 mmol/L  ? Glucose, Bld 103 (H) 70 - 99 mg/dL  ?  Comment: Glucose reference range applies only to samples taken after fasting for at least 8 hours.  ? BUN 52 (H) 8 - 23 mg/dL  ? Creatinine, Ser 1.79 (H) 0.44 - 1.00 mg/dL  ? Calcium 9.4 8.9 - 10.3 mg/dL  ? GFR, Estimated 26 (L) >60 mL/min  ?  Comment: (NOTE) ?Calculated using the CKD-EPI Creatinine Equation (2021) ?  ? Anion gap 11 5 - 15  ?  Comment: Performed at [facility], [address], [place], [place] [address]  ? ?No results found. ? ?Review of Systems ? ?Blood pressure 134/76, pulse 86, temperature 97.7 ?F (36.5 ?C), temperature source Oral, resp. rate 18, SpO2 96 %. ?Physical Exam ?HENT:  ?   Mouth/Throat:  ?   Mouth: Mucous membranes are moist.  ?   Pharynx: Oropharynx is clear.  ?Eyes:  ?   General: No scleral icterus. ?   Conjunctiva/sclera: Conjunctivae normal.  ?Cardiovascular:  ?   Rate and Rhythm: Normal rate and regular rhythm.  ?   Heart sounds: Normal heart sounds. No murmur heard. ?Pulmonary:  ?   Effort: Pulmonary effort is normal.  ?   Breath sounds: Normal breath sounds.  ?Abdominal:  ?   Comments: Abdomen is flat.  She has long midline scar.  On palpation abdomen is soft and nontender with organomegaly or masses.  ?Musculoskeletal:     ?   General: No swelling.  ?   Cervical back: Neck supple.  ?Lymphadenopathy:  ?   Cervical: No cervical adenopathy.  ?Skin: ?   General: Skin is warm and dry.  ?Neurological:  ?   Mental Status: She is alert.  ?  ? ?Assessment/Plan ? ?Iron deficiency anemia.  Heme positive stool. ?Diagnostic esophagogastroduodenoscopy. ? ?Hildred Laser, MD ?04/04/2022, 12:51 PM ? ? ? ?

## 2022-04-04 NOTE — Anesthesia Preprocedure Evaluation (Signed)
Anesthesia Evaluation  ?Patient identified by MRN, date of birth, ID band ?Patient awake ? ? ? ?Reviewed: ?Allergy & Precautions, H&P , NPO status , Patient's Chart, lab work & pertinent test results, reviewed documented beta blocker date and time  ? ?Airway ?Mallampati: II ? ?TM Distance: >3 FB ?Neck ROM: full ? ? ? Dental ?no notable dental hx. ? ?  ?Pulmonary ?neg pulmonary ROS, former smoker,  ?  ?Pulmonary exam normal ?breath sounds clear to auscultation ? ? ? ? ? ? Cardiovascular ?Exercise Tolerance: Good ?hypertension, negative cardio ROS ? ? ?Rhythm:regular Rate:Normal ? ? ?  ?Neuro/Psych ?PSYCHIATRIC DISORDERS Anxiety Depression negative neurological ROS ?   ? GI/Hepatic ?Neg liver ROS, GERD  Medicated,  ?Endo/Other  ?Hypothyroidism  ? Renal/GU ?CRFRenal disease  ?negative genitourinary ?  ?Musculoskeletal ? ? Abdominal ?  ?Peds ? Hematology ? ?(+) Blood dyscrasia, anemia ,   ?Anesthesia Other Findings ?Late Entry ? Reproductive/Obstetrics ?negative OB ROS ? ?  ? ? ? ? ? ? ? ? ? ? ? ? ? ?  ?  ? ? ? ? ? ? ? ? ?Anesthesia Physical ?Anesthesia Plan ? ?ASA: 2 ? ?Anesthesia Plan: General  ? ?Post-op Pain Management:   ? ?Induction:  ? ?PONV Risk Score and Plan: Propofol infusion ? ?Airway Management Planned:  ? ?Additional Equipment:  ? ?Intra-op Plan:  ? ?Post-operative Plan:  ? ?Informed Consent: I have reviewed the patients History and Physical, chart, labs and discussed the procedure including the risks, benefits and alternatives for the proposed anesthesia with the patient or authorized representative who has indicated his/her understanding and acceptance.  ? ? ? ?Dental Advisory Given ? ?Plan Discussed with: CRNA ? ?Anesthesia Plan Comments:   ? ? ? ? ? ? ?Anesthesia Quick Evaluation ? ?

## 2022-04-04 NOTE — Transfer of Care (Signed)
Immediate Anesthesia Transfer of Care Note ? ?Patient: Jane Andrews ? ?Procedure(s) Performed: ESOPHAGOGASTRODUODENOSCOPY (EGD) WITH PROPOFOL ? ?Patient Location: PACU ? ?Anesthesia Type:General ? ?Level of Consciousness: awake, alert  and oriented ? ?Airway & Oxygen Therapy: Patient Spontanous Breathing ? ?Post-op Assessment: Report given to RN, Post -op Vital signs reviewed and stable, Patient moving all extremities X 4 and Patient able to stick tongue midline ? ?Post vital signs: Reviewed ? ?Last Vitals:  ?Vitals Value Taken Time  ?BP 77/39 04/04/22 1315  ?Temp 36.5 ?C 04/04/22 1315  ?Pulse 97   ?Resp 15 04/04/22 1315  ?SpO2 99 % 04/04/22 1315  ? ? ?Last Pain:  ?Vitals:  ? 04/04/22 1315  ?TempSrc: Oral  ?PainSc: 0-No pain  ?   ? ?Patients Stated Pain Goal: 7 (04/04/22 1228) ? ?Complications: No notable events documented. ?

## 2022-04-07 ENCOUNTER — Telehealth (INDEPENDENT_AMBULATORY_CARE_PROVIDER_SITE_OTHER): Payer: Self-pay

## 2022-04-07 ENCOUNTER — Ambulatory Visit (INDEPENDENT_AMBULATORY_CARE_PROVIDER_SITE_OTHER): Payer: Medicare Other

## 2022-04-07 ENCOUNTER — Encounter: Payer: Self-pay | Admitting: Orthopedic Surgery

## 2022-04-07 ENCOUNTER — Ambulatory Visit (INDEPENDENT_AMBULATORY_CARE_PROVIDER_SITE_OTHER): Payer: Medicare Other | Admitting: Orthopedic Surgery

## 2022-04-07 VITALS — BP 130/83 | HR 92 | Ht 61.0 in | Wt 122.8 lb

## 2022-04-07 DIAGNOSIS — M79641 Pain in right hand: Secondary | ICD-10-CM

## 2022-04-07 MED ORDER — PREDNISONE 10 MG PO TABS: 10.0000 mg | ORAL_TABLET | Freq: Every day | ORAL | 0 refills | Status: DC

## 2022-04-07 NOTE — Telephone Encounter (Signed)
Patient will need electrolytes and h&h two weeks from 04/04/2022. Per Tammy at Endo and Dr. Laural Golden. ?

## 2022-04-07 NOTE — Telephone Encounter (Signed)
Tried calling no answer. Patient will need to have her labs drawn around 04/18/2022. ?

## 2022-04-07 NOTE — Patient Instructions (Signed)
Follow up in 2 weeks for right hand pain  ?

## 2022-04-07 NOTE — Progress Notes (Signed)
Chief Complaint  ?Patient presents with  ? Hand Pain  ?  Right thumb- started about a week ago   ? ? ?HPI: RIGHT THUMB PAIN, HARD TO TURN THE KEY AND USE THE HAND  ? ?PAIN IS DESCRIBED OVER THE IPJ ? ? ? ?Past Medical History:  ?Diagnosis Date  ? Abdominal adhesions 1994  ? Allergic rhinitis   ? Anemia   ? Anxiety and depression   ? Aortic stenosis   ? Arthritis   ? Atrial fibrillation ([place]) 10/2012  ? Associated with severe anemia and esophageal pill impaction  ? Breast carcinoma ([place])   ? Right mastectomy  ? Cholelithiasis   ? Essential hypertension   ? Gastroesophageal reflux disease   ? Hiatal hernia  ? History of blood transfusion   ? Hypothyroidism   ? Low back pain   ? Malabsorption   ? 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 Nephrolithiasis 2004  ? Painless hematuria  ? Short gut syndrome   ? Bowel resection , 2004  ? Upper GI bleed 2004  ? Multiple episodes of melena-? due to gastritis or adverse drug effect (nonsteroidals, small bowel ulceration with Fosamax); caused by Pepto-Bismol during one Emergency Department evaluation  ? ? ?BP 130/83   Pulse 92   Ht '5\' 1"'$  (1.549 m)   Wt 122 lb 12.8 oz (55.7 kg)   BMI 23.20 kg/m?  ? ? ?General appearance: Well-developed well-nourished no gross deformities ? ?Cardiovascular normal pulse and perfusion normal color without edema ? ?Neurologically no sensation loss or deficits or pathologic reflexes ? ?Psychological: Awake alert and oriented x3 mood and affect normal ? ?Skin no lacerations or ulcerations no nodularity no palpable masses, no erythema or nodularity ? ?Musculoskeletal:  ?TENDERNESS OVER THE MCP , W / NO PAIN OVER THE IP ? ?NORMAL EXT OF DIP AND IP AND NORMAL FLEXION AT HE DIP  ? ?Imaging XRAYS - OA MCP  ? ?A/P ? ?? AETIOLGY ? ?OA V SYNOVOTIS VS IPJ OA VS TENDON SUBLUXATION  ? ?SPLINT AND NASIAIDS RE CK IN 10 DAYS OR SO  ? ?Meds ordered this encounter  ?Medications  ? predniSONE (DELTASONE) 10 MG tablet  ?  Sig: Take 1 tablet (10  mg total) by mouth daily for 10 days.  ?  Dispense:  10 tablet  ?  Refill:  0  ? ? ?

## 2022-04-08 ENCOUNTER — Encounter (HOSPITAL_COMMUNITY): Payer: Self-pay | Admitting: Internal Medicine

## 2022-04-08 DIAGNOSIS — I5032 Chronic diastolic (congestive) heart failure: Secondary | ICD-10-CM | POA: Diagnosis not present

## 2022-04-08 DIAGNOSIS — R809 Proteinuria, unspecified: Secondary | ICD-10-CM | POA: Diagnosis not present

## 2022-04-08 DIAGNOSIS — I129 Hypertensive chronic kidney disease with stage 1 through stage 4 chronic kidney disease, or unspecified chronic kidney disease: Secondary | ICD-10-CM | POA: Diagnosis not present

## 2022-04-08 DIAGNOSIS — H4921 Sixth [abducent] nerve palsy, right eye: Secondary | ICD-10-CM | POA: Diagnosis not present

## 2022-04-08 DIAGNOSIS — D638 Anemia in other chronic diseases classified elsewhere: Secondary | ICD-10-CM | POA: Diagnosis not present

## 2022-04-08 DIAGNOSIS — N170 Acute kidney failure with tubular necrosis: Secondary | ICD-10-CM | POA: Diagnosis not present

## 2022-04-08 DIAGNOSIS — N1832 Chronic kidney disease, stage 3b: Secondary | ICD-10-CM | POA: Diagnosis not present

## 2022-04-09 ENCOUNTER — Other Ambulatory Visit (INDEPENDENT_AMBULATORY_CARE_PROVIDER_SITE_OTHER): Payer: Self-pay

## 2022-04-09 DIAGNOSIS — N1832 Chronic kidney disease, stage 3b: Secondary | ICD-10-CM

## 2022-04-09 DIAGNOSIS — D509 Iron deficiency anemia, unspecified: Secondary | ICD-10-CM

## 2022-04-09 DIAGNOSIS — E875 Hyperkalemia: Secondary | ICD-10-CM

## 2022-04-09 NOTE — Telephone Encounter (Signed)
Patient aware to have her labs drawn at [facility] around 04/18/2022. She is aware to pick up the lab orders from the front desk. ?

## 2022-04-10 ENCOUNTER — Encounter: Payer: Self-pay | Admitting: Urology

## 2022-04-10 ENCOUNTER — Ambulatory Visit (INDEPENDENT_AMBULATORY_CARE_PROVIDER_SITE_OTHER): Payer: Medicare Other | Admitting: Urology

## 2022-04-10 VITALS — BP 117/68 | HR 92

## 2022-04-10 DIAGNOSIS — Z87440 Personal history of urinary (tract) infections: Secondary | ICD-10-CM | POA: Diagnosis not present

## 2022-04-10 DIAGNOSIS — R309 Painful micturition, unspecified: Secondary | ICD-10-CM

## 2022-04-10 DIAGNOSIS — N200 Calculus of kidney: Secondary | ICD-10-CM

## 2022-04-10 DIAGNOSIS — N3289 Other specified disorders of bladder: Secondary | ICD-10-CM | POA: Diagnosis not present

## 2022-04-10 MED ORDER — CIPROFLOXACIN HCL 500 MG PO TABS
500.0000 mg | ORAL_TABLET | Freq: Once | ORAL | Status: AC
  Administered 2022-04-10: 500 mg via ORAL

## 2022-04-10 NOTE — H&P (View-Only) (Signed)
? ?Assessment: ?1. Bladder mass   ?2. History of UTI   ?3. Pain with urination   ?4. Nephrolithiasis   ? ? ?Plan: ?Urine cytology sent today. ?Cipro x1 following cystoscopy. ?I discussed the findings on cystoscopy with the patient today.  Given these findings, I have recommended further evaluation with cystoscopy, bilateral retrograde pyelograms, transurethral resection of bladder tumor.  Risk and benefits of the procedure discussed. ? ?Procedure: ?The patient will be scheduled for cystoscopy, bilateral retrograde pyelograms, transurethral section of bladder tumor at [facility].  Surgical request is placed with the surgery schedulers and will be scheduled at the patient's/family request. Informed consent is given as documented below. ?Anesthesia: General ? ?The patient does not have sleep apnea, history of MRSA, history of VRE, history of cardiac device requiring special anesthetic needs. ?Patient is stable and considered clear for surgical in an outpatient ambulatory surgery setting as well as patient hospital setting. ? ?Consent for Operation or Procedure: Provider Certification ?I hereby certify that the nature, purpose, benefits, usual and most frequent risks of, and alternatives to, the operation or procedure have been explained to the patient (or person authorized to sign for the patient) either by me as responsible physician or by the provider who is to perform the operation or procedure. Time spent such that the patient/family has had an opportunity to ask questions, and that those questions have been answered. The patient or the patient's representative has been advised that selected tasks may be performed by assistants to the primary health care provider(s). I believe that the patient (or person authorized to sign for the patient) understands what has been explained, and has consented to the operation or procedure. No guarantees were implied or made. ? ? ?Chief Complaint: ?Chief Complaint  ?Patient  presents with  ? Dysuria  ? ? ?HPI: ?Jane Andrews is a 88 y.o. female who presents for continued evaluation of dysuria.  She was initially seen in January 2023 for dysuria.  Her symptoms have been present for approximately 2 months.   ?Urine culture from 12/21/2021 grew 50-100 K mixed flora.   ?MDX culture from 01/07/2022 showed no organisms. ?Urine culture from 03/29/2022 grew 10-20 5K mixed flora. ?No gross hematuria.  She does have urgency, frequency. ? ?She presents today for further evaluation with cystoscopy. ?She continues to have intermittent dysuria, frequency, and incontinence.  No gross hematuria or flank pain. ?CT renal stone study from 01/26/2022 showed a stable staghorn calculus in the right kidney, tiny left lower pole renal calculi, and a cystic area in the right pelvis/adnexa. ? ?Portions of the above documentation were copied from a prior visit for review purposes only. ? ?Allergies: ?Allergies  ?Allergen Reactions  ? Codeine Nausea And Vomiting  ? ? ?PMH: ?Past Medical History:  ?Diagnosis Date  ? Abdominal adhesions 1994  ? Allergic rhinitis   ? Anemia   ? Anxiety and depression   ? Aortic stenosis   ? Arthritis   ? Atrial fibrillation ([place]) 10/2012  ? Associated with severe anemia and esophageal pill impaction  ? Breast carcinoma ([place])   ? Right mastectomy  ? Cholelithiasis   ? Essential hypertension   ? Gastroesophageal reflux disease   ? Hiatal hernia  ? History of blood transfusion   ? Hypothyroidism   ? Low back pain   ? Malabsorption   ? 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 Nephrolithiasis 2004  ? Painless hematuria  ? Short gut syndrome   ?  Bowel resection , 2004  ? Upper GI bleed 2004  ? Multiple episodes of melena-? due to gastritis or adverse drug effect (nonsteroidals, small bowel ulceration with Fosamax); caused by Pepto-Bismol during one Emergency Department evaluation  ? ? ?PSH: ?Past Surgical History:  ?Procedure Laterality Date  ? ABDOMINAL  HYSTERECTOMY    ? emergency s/p delivery  ? ABDOMINAL HYSTERECTOMY  1960  ? massive gynecologic bleeding  ? BOWEL RESECTION    ? Resulting short gut syndrome  ? CHOLECYSTECTOMY N/A 06/25/2018  ? Procedure: LAPAROSCOPIC CHOLECYSTECTOMY WITH INTRAOPERATIVE CHOLANGIOGRAM;  Surgeon: Armandina Gemma, MD;  Location: [facility];  Service: General;  Laterality: N/A;  ? COLONOSCOPY W/ POLYPECTOMY  2005  ? Lipoma; diverticulosis  ? COLONOSCOPY WITH ESOPHAGOGASTRODUODENOSCOPY (EGD)  11/22/2012  ? [place]  ? ESOPHAGOGASTRODUODENOSCOPY (EGD) WITH PROPOFOL N/A 04/04/2022  ? Procedure: ESOPHAGOGASTRODUODENOSCOPY (EGD) WITH PROPOFOL;  Surgeon: Rogene Houston, MD;  [facility];  Service: Endoscopy;  Laterality: N/A;  210  ? HIP ARTHROPLASTY Right 06/15/2020  ? Procedure: ANTERIOR ARTHROPLASTY BIPOLAR HIP (HEMIARTHROPLASTY);  Surgeon: Mcarthur Rossetti, MD;  Location: [facility];  Service: Orthopedics;  Laterality: Right;  ? [place]  ? s/p adhesions  ? MASTECTOMY  right breast  ? MASTECTOMY    ? Carcinoma of the breast; right  ? UPPER GASTROINTESTINAL ENDOSCOPY    ? ? ?SH: ?Social History  ? ?Tobacco Use  ? Smoking status: Former  ?  Packs/day: 1.50  ?  Years: 20.00  ?  Pack years: 30.00  ?  Types: Cigarettes  ? Smokeless tobacco: Never  ?Vaping Use  ? Vaping Use: Never used  ?Substance Use Topics  ? Alcohol use: No  ? Drug use: No  ? ? ?ROS: ?Constitutional:  Negative for fever, chills, weight loss ?CV: Negative for chest pain, previous MI, hypertension ?Respiratory:  Negative for shortness of breath, wheezing, sleep apnea, frequent cough ?GI:  Negative for nausea, vomiting, bloody stool, GERD ? ?PE: ?BP 117/68   Pulse 92  ?GENERAL APPEARANCE:  Well appearing, well developed, well nourished, NAD ?HEENT:  Atraumatic, normocephalic, oropharynx clear ?NECK:  Supple without lymphadenopathy or thyromegaly ?ABDOMEN:  Soft, non-tender, no masses ?EXTREMITIES:  Moves all extremities well, without clubbing,  cyanosis, or edema ?NEUROLOGIC:  Alert and oriented x 3, normal gait, CN II-XII grossly intact ?MENTAL STATUS:  appropriate ?BACK:  Non-tender to palpation, No CVAT ?SKIN:  Warm, dry, and intact ? ? ?Results: ?U/A: >30 WBCs, 3-10 RBCs, >10 epithelial cells, few bacteria, positive yeast ? ?Procedure:  Flexible Cystourethroscopy ? ?Pre-operative Diagnosis: Dysuria ? ?Post-operative Diagnosis:  Bladder mass ? ?Anesthesia:  local with lidocaine jelly ? ?Surgical Narrative: ? ?After appropriate informed consent was obtained, the patient was prepped and draped in the usual sterile fashion in the supine position.  The patient was correctly identified and the proper procedure delineated prior to proceeding.  Sterile lidocaine gel was instilled in the urethra. ?The flexible cystoscope was introduced without difficulty. ? ?Findings: ? ?Urethra: Normal ? ?Bladder:  Sessile appearing mass at bladder neck in area of trigone with associated inflammatory changes ? ?Ureteral orifices:  Unable to visualize due to mass ? ?Additional findings: None ? ?Saline bladder wash for cytology was performed.   ? ?The cystoscope was then removed.  The patient tolerated the procedure well. ? ? ? ?

## 2022-04-10 NOTE — Progress Notes (Signed)
Tracking number [id number] ?Pick up number [id number] ?Urine cytology sent via UPS ?

## 2022-04-10 NOTE — Progress Notes (Signed)
? ?Assessment: ?1. Bladder mass   ?2. History of UTI   ?3. Pain with urination   ?4. Nephrolithiasis   ? ? ?Plan: ?Urine cytology sent today. ?Cipro x1 following cystoscopy. ?I discussed the findings on cystoscopy with the patient today.  Given these findings, I have recommended further evaluation with cystoscopy, bilateral retrograde pyelograms, transurethral resection of bladder tumor.  Risk and benefits of the procedure discussed. ? ?Procedure: ?The patient will be scheduled for cystoscopy, bilateral retrograde pyelograms, transurethral section of bladder tumor at [facility].  Surgical request is placed with the surgery schedulers and will be scheduled at the patient's/family request. Informed consent is given as documented below. ?Anesthesia: General ? ?The patient does not have sleep apnea, history of MRSA, history of VRE, history of cardiac device requiring special anesthetic needs. ?Patient is stable and considered clear for surgical in an outpatient ambulatory surgery setting as well as patient hospital setting. ? ?Consent for Operation or Procedure: Provider Certification ?I hereby certify that the nature, purpose, benefits, usual and most frequent risks of, and alternatives to, the operation or procedure have been explained to the patient (or person authorized to sign for the patient) either by me as responsible physician or by the provider who is to perform the operation or procedure. Time spent such that the patient/family has had an opportunity to ask questions, and that those questions have been answered. The patient or the patient's representative has been advised that selected tasks may be performed by assistants to the primary health care provider(s). I believe that the patient (or person authorized to sign for the patient) understands what has been explained, and has consented to the operation or procedure. No guarantees were implied or made. ? ? ?Chief Complaint: ?Chief Complaint  ?Patient  presents with  ? Dysuria  ? ? ?HPI: ?Jane Andrews is a 88 y.o. female who presents for continued evaluation of dysuria.  She was initially seen in January 2023 for dysuria.  Her symptoms have been present for approximately 2 months.   ?Urine culture from 12/21/2021 grew 50-100 K mixed flora.   ?MDX culture from 01/07/2022 showed no organisms. ?Urine culture from 03/29/2022 grew 10-20 5K mixed flora. ?No gross hematuria.  She does have urgency, frequency. ? ?She presents today for further evaluation with cystoscopy. ?She continues to have intermittent dysuria, frequency, and incontinence.  No gross hematuria or flank pain. ?CT renal stone study from 01/26/2022 showed a stable staghorn calculus in the right kidney, tiny left lower pole renal calculi, and a cystic area in the right pelvis/adnexa. ? ?Portions of the above documentation were copied from a prior visit for review purposes only. ? ?Allergies: ?Allergies  ?Allergen Reactions  ? Codeine Nausea And Vomiting  ? ? ?PMH: ?Past Medical History:  ?Diagnosis Date  ? Abdominal adhesions 1994  ? Allergic rhinitis   ? Anemia   ? Anxiety and depression   ? Aortic stenosis   ? Arthritis   ? Atrial fibrillation ([place]) 10/2012  ? Associated with severe anemia and esophageal pill impaction  ? Breast carcinoma ([place])   ? Right mastectomy  ? Cholelithiasis   ? Essential hypertension   ? Gastroesophageal reflux disease   ? Hiatal hernia  ? History of blood transfusion   ? Hypothyroidism   ? Low back pain   ? Malabsorption   ? 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 Nephrolithiasis 2004  ? Painless hematuria  ? Short gut syndrome   ?  Bowel resection , 2004  ? Upper GI bleed 2004  ? Multiple episodes of melena-? due to gastritis or adverse drug effect (nonsteroidals, small bowel ulceration with Fosamax); caused by Pepto-Bismol during one Emergency Department evaluation  ? ? ?PSH: ?Past Surgical History:  ?Procedure Laterality Date  ? ABDOMINAL  HYSTERECTOMY    ? emergency s/p delivery  ? ABDOMINAL HYSTERECTOMY  1960  ? massive gynecologic bleeding  ? BOWEL RESECTION    ? Resulting short gut syndrome  ? CHOLECYSTECTOMY N/A 06/25/2018  ? Procedure: LAPAROSCOPIC CHOLECYSTECTOMY WITH INTRAOPERATIVE CHOLANGIOGRAM;  Surgeon: Armandina Gemma, MD;  Location: [facility];  Service: General;  Laterality: N/A;  ? COLONOSCOPY W/ POLYPECTOMY  2005  ? Lipoma; diverticulosis  ? COLONOSCOPY WITH ESOPHAGOGASTRODUODENOSCOPY (EGD)  11/22/2012  ? Rehman  ? ESOPHAGOGASTRODUODENOSCOPY (EGD) WITH PROPOFOL N/A 04/04/2022  ? Procedure: ESOPHAGOGASTRODUODENOSCOPY (EGD) WITH PROPOFOL;  Surgeon: Rogene Houston, MD;  [facility];  Service: Endoscopy;  Laterality: N/A;  210  ? HIP ARTHROPLASTY Right 06/15/2020  ? Procedure: ANTERIOR ARTHROPLASTY BIPOLAR HIP (HEMIARTHROPLASTY);  Surgeon: Mcarthur Rossetti, MD;  Location: [facility];  Service: Orthopedics;  Laterality: Right;  ? [place]  ? s/p adhesions  ? MASTECTOMY  right breast  ? MASTECTOMY    ? Carcinoma of the breast; right  ? UPPER GASTROINTESTINAL ENDOSCOPY    ? ? ?SH: ?Social History  ? ?Tobacco Use  ? Smoking status: Former  ?  Packs/day: 1.50  ?  Years: 20.00  ?  Pack years: 30.00  ?  Types: Cigarettes  ? Smokeless tobacco: Never  ?Vaping Use  ? Vaping Use: Never used  ?Substance Use Topics  ? Alcohol use: No  ? Drug use: No  ? ? ?ROS: ?Constitutional:  Negative for fever, chills, weight loss ?CV: Negative for chest pain, previous MI, hypertension ?Respiratory:  Negative for shortness of breath, wheezing, sleep apnea, frequent cough ?GI:  Negative for nausea, vomiting, bloody stool, GERD ? ?PE: ?BP 117/68   Pulse 92  ?GENERAL APPEARANCE:  Well appearing, well developed, well nourished, NAD ?HEENT:  Atraumatic, normocephalic, oropharynx clear ?NECK:  Supple without lymphadenopathy or thyromegaly ?ABDOMEN:  Soft, non-tender, no masses ?EXTREMITIES:  Moves all extremities well, without clubbing,  cyanosis, or edema ?NEUROLOGIC:  Alert and oriented x 3, normal gait, CN II-XII grossly intact ?MENTAL STATUS:  appropriate ?BACK:  Non-tender to palpation, No CVAT ?SKIN:  Warm, dry, and intact ? ? ?Results: ?U/A: >30 WBCs, 3-10 RBCs, >10 epithelial cells, few bacteria, positive yeast ? ?Procedure:  Flexible Cystourethroscopy ? ?Pre-operative Diagnosis: Dysuria ? ?Post-operative Diagnosis:  Bladder mass ? ?Anesthesia:  local with lidocaine jelly ? ?Surgical Narrative: ? ?After appropriate informed consent was obtained, the patient was prepped and draped in the usual sterile fashion in the supine position.  The patient was correctly identified and the proper procedure delineated prior to proceeding.  Sterile lidocaine gel was instilled in the urethra. ?The flexible cystoscope was introduced without difficulty. ? ?Findings: ? ?Urethra: Normal ? ?Bladder:  Sessile appearing mass at bladder neck in area of trigone with associated inflammatory changes ? ?Ureteral orifices:  Unable to visualize due to mass ? ?Additional findings: None ? ?Saline bladder wash for cytology was performed.   ? ?The cystoscope was then removed.  The patient tolerated the procedure well. ? ? ? ?

## 2022-04-11 LAB — URINALYSIS, ROUTINE W REFLEX MICROSCOPIC
Bilirubin, UA: NEGATIVE
Glucose, UA: NEGATIVE
Ketones, UA: NEGATIVE
Nitrite, UA: NEGATIVE
Specific Gravity, UA: 1.015 (ref 1.005–1.030)
Urobilinogen, Ur: 0.2 mg/dL (ref 0.2–1.0)
pH, UA: 5.5 (ref 5.0–7.5)

## 2022-04-11 LAB — MICROSCOPIC EXAMINATION
Epithelial Cells (non renal): >10 /hpf — AB (ref 0–10)
WBC, UA: >30 /hpf — AB (ref 0–5)

## 2022-04-12 DIAGNOSIS — R809 Proteinuria, unspecified: Secondary | ICD-10-CM | POA: Diagnosis not present

## 2022-04-12 DIAGNOSIS — D638 Anemia in other chronic diseases classified elsewhere: Secondary | ICD-10-CM | POA: Diagnosis not present

## 2022-04-12 DIAGNOSIS — I5032 Chronic diastolic (congestive) heart failure: Secondary | ICD-10-CM | POA: Diagnosis not present

## 2022-04-12 DIAGNOSIS — N170 Acute kidney failure with tubular necrosis: Secondary | ICD-10-CM | POA: Diagnosis not present

## 2022-04-12 DIAGNOSIS — N1832 Chronic kidney disease, stage 3b: Secondary | ICD-10-CM | POA: Diagnosis not present

## 2022-04-12 DIAGNOSIS — I129 Hypertensive chronic kidney disease with stage 1 through stage 4 chronic kidney disease, or unspecified chronic kidney disease: Secondary | ICD-10-CM | POA: Diagnosis not present

## 2022-04-12 DIAGNOSIS — N3289 Other specified disorders of bladder: Secondary | ICD-10-CM | POA: Diagnosis not present

## 2022-04-14 ENCOUNTER — Other Ambulatory Visit: Payer: Self-pay | Admitting: Nephrology

## 2022-04-14 ENCOUNTER — Other Ambulatory Visit (HOSPITAL_COMMUNITY): Payer: Self-pay | Admitting: Nephrology

## 2022-04-14 DIAGNOSIS — I129 Hypertensive chronic kidney disease with stage 1 through stage 4 chronic kidney disease, or unspecified chronic kidney disease: Secondary | ICD-10-CM

## 2022-04-14 DIAGNOSIS — N170 Acute kidney failure with tubular necrosis: Secondary | ICD-10-CM

## 2022-04-14 DIAGNOSIS — R808 Other proteinuria: Secondary | ICD-10-CM

## 2022-04-14 DIAGNOSIS — N1832 Chronic kidney disease, stage 3b: Secondary | ICD-10-CM

## 2022-04-14 DIAGNOSIS — D638 Anemia in other chronic diseases classified elsewhere: Secondary | ICD-10-CM

## 2022-04-15 ENCOUNTER — Other Ambulatory Visit: Payer: Self-pay

## 2022-04-15 ENCOUNTER — Encounter (HOSPITAL_COMMUNITY): Payer: Self-pay

## 2022-04-15 ENCOUNTER — Encounter (HOSPITAL_COMMUNITY)
Admission: RE | Admit: 2022-04-15 | Discharge: 2022-04-15 | Disposition: A | Discharge status: Home / Self Care | Payer: Medicare Other | Source: Ambulatory Visit | Attending: Urology | Admitting: Urology

## 2022-04-15 DIAGNOSIS — N1832 Chronic kidney disease, stage 3b: Secondary | ICD-10-CM | POA: Diagnosis not present

## 2022-04-15 DIAGNOSIS — N170 Acute kidney failure with tubular necrosis: Secondary | ICD-10-CM | POA: Diagnosis not present

## 2022-04-15 DIAGNOSIS — I129 Hypertensive chronic kidney disease with stage 1 through stage 4 chronic kidney disease, or unspecified chronic kidney disease: Secondary | ICD-10-CM | POA: Diagnosis not present

## 2022-04-15 DIAGNOSIS — R809 Proteinuria, unspecified: Secondary | ICD-10-CM | POA: Diagnosis not present

## 2022-04-15 DIAGNOSIS — I5032 Chronic diastolic (congestive) heart failure: Secondary | ICD-10-CM | POA: Diagnosis not present

## 2022-04-15 DIAGNOSIS — D638 Anemia in other chronic diseases classified elsewhere: Secondary | ICD-10-CM | POA: Diagnosis not present

## 2022-04-16 ENCOUNTER — Ambulatory Visit (HOSPITAL_BASED_OUTPATIENT_CLINIC_OR_DEPARTMENT_OTHER): Payer: Medicare Other | Admitting: Anesthesiology

## 2022-04-16 ENCOUNTER — Ambulatory Visit (HOSPITAL_COMMUNITY): Payer: Medicare Other

## 2022-04-16 ENCOUNTER — Ambulatory Visit (HOSPITAL_COMMUNITY): Payer: Medicare Other | Admitting: Anesthesiology

## 2022-04-16 ENCOUNTER — Inpatient Hospital Stay (HOSPITAL_COMMUNITY)
Admission: RE | Admit: 2022-04-16 | Discharge: 2022-04-18 | DRG: 669 | Disposition: A | Discharge status: Home / Self Care | Payer: Medicare Other | Attending: Urology | Admitting: Urology

## 2022-04-16 ENCOUNTER — Encounter (HOSPITAL_COMMUNITY)
Admission: RE | Disposition: A | Discharge status: Home / Self Care | Payer: Self-pay | Source: Home / Self Care | Attending: Urology

## 2022-04-16 ENCOUNTER — Observation Stay (HOSPITAL_COMMUNITY): Payer: Medicare Other

## 2022-04-16 ENCOUNTER — Encounter (HOSPITAL_COMMUNITY): Payer: Self-pay | Admitting: Urology

## 2022-04-16 DIAGNOSIS — E039 Hypothyroidism, unspecified: Secondary | ICD-10-CM | POA: Diagnosis not present

## 2022-04-16 DIAGNOSIS — K5790 Diverticulosis of intestine, part unspecified, without perforation or abscess without bleeding: Secondary | ICD-10-CM | POA: Diagnosis present

## 2022-04-16 DIAGNOSIS — N132 Hydronephrosis with renal and ureteral calculous obstruction: Secondary | ICD-10-CM | POA: Diagnosis present

## 2022-04-16 DIAGNOSIS — Z853 Personal history of malignant neoplasm of breast: Secondary | ICD-10-CM | POA: Diagnosis not present

## 2022-04-16 DIAGNOSIS — Z9011 Acquired absence of right breast and nipple: Secondary | ICD-10-CM

## 2022-04-16 DIAGNOSIS — Z885 Allergy status to narcotic agent status: Secondary | ICD-10-CM | POA: Diagnosis not present

## 2022-04-16 DIAGNOSIS — Z87891 Personal history of nicotine dependence: Secondary | ICD-10-CM | POA: Diagnosis not present

## 2022-04-16 DIAGNOSIS — Z9049 Acquired absence of other specified parts of digestive tract: Secondary | ICD-10-CM | POA: Diagnosis not present

## 2022-04-16 DIAGNOSIS — F419 Anxiety disorder, unspecified: Secondary | ICD-10-CM | POA: Diagnosis present

## 2022-04-16 DIAGNOSIS — K219 Gastro-esophageal reflux disease without esophagitis: Secondary | ICD-10-CM | POA: Diagnosis present

## 2022-04-16 DIAGNOSIS — F338 Other recurrent depressive disorders: Secondary | ICD-10-CM | POA: Diagnosis present

## 2022-04-16 DIAGNOSIS — Z79899 Other long term (current) drug therapy: Secondary | ICD-10-CM

## 2022-04-16 DIAGNOSIS — N184 Chronic kidney disease, stage 4 (severe): Secondary | ICD-10-CM | POA: Diagnosis present

## 2022-04-16 DIAGNOSIS — Z825 Family history of asthma and other chronic lower respiratory diseases: Secondary | ICD-10-CM | POA: Diagnosis not present

## 2022-04-16 DIAGNOSIS — D649 Anemia, unspecified: Secondary | ICD-10-CM | POA: Diagnosis present

## 2022-04-16 DIAGNOSIS — Z79890 Hormone replacement therapy (postmenopausal): Secondary | ICD-10-CM

## 2022-04-16 DIAGNOSIS — I96 Gangrene, not elsewhere classified: Secondary | ICD-10-CM | POA: Diagnosis present

## 2022-04-16 DIAGNOSIS — N200 Calculus of kidney: Secondary | ICD-10-CM | POA: Diagnosis not present

## 2022-04-16 DIAGNOSIS — I129 Hypertensive chronic kidney disease with stage 1 through stage 4 chronic kidney disease, or unspecified chronic kidney disease: Secondary | ICD-10-CM | POA: Diagnosis not present

## 2022-04-16 DIAGNOSIS — Z8261 Family history of arthritis: Secondary | ICD-10-CM

## 2022-04-16 DIAGNOSIS — N189 Chronic kidney disease, unspecified: Secondary | ICD-10-CM

## 2022-04-16 DIAGNOSIS — Z96641 Presence of right artificial hip joint: Secondary | ICD-10-CM | POA: Diagnosis present

## 2022-04-16 DIAGNOSIS — N3289 Other specified disorders of bladder: Secondary | ICD-10-CM | POA: Diagnosis not present

## 2022-04-16 DIAGNOSIS — D494 Neoplasm of unspecified behavior of bladder: Secondary | ICD-10-CM | POA: Diagnosis not present

## 2022-04-16 DIAGNOSIS — C670 Malignant neoplasm of trigone of bladder: Secondary | ICD-10-CM | POA: Diagnosis not present

## 2022-04-16 DIAGNOSIS — C679 Malignant neoplasm of bladder, unspecified: Principal | ICD-10-CM | POA: Diagnosis present

## 2022-04-16 DIAGNOSIS — D631 Anemia in chronic kidney disease: Secondary | ICD-10-CM | POA: Diagnosis not present

## 2022-04-16 DIAGNOSIS — I10 Essential (primary) hypertension: Secondary | ICD-10-CM | POA: Diagnosis present

## 2022-04-16 DIAGNOSIS — F339 Major depressive disorder, recurrent, unspecified: Secondary | ICD-10-CM | POA: Diagnosis present

## 2022-04-16 DIAGNOSIS — N1330 Unspecified hydronephrosis: Secondary | ICD-10-CM | POA: Diagnosis not present

## 2022-04-16 DIAGNOSIS — Z87440 Personal history of urinary (tract) infections: Secondary | ICD-10-CM

## 2022-04-16 HISTORY — PX: TRANSURETHRAL RESECTION OF BLADDER TUMOR: SHX2575

## 2022-04-16 HISTORY — PX: CYSTOSCOPY W/ RETROGRADES: SHX1426

## 2022-04-16 LAB — CBC WITH DIFFERENTIAL/PLATELET
Abs Immature Granulocytes: 0.07 10*3/uL (ref 0.00–0.07)
Basophils Absolute: 0.1 10*3/uL (ref 0.0–0.1)
Basophils Relative: 0 %
Eosinophils Absolute: 0.2 10*3/uL (ref 0.0–0.5)
Eosinophils Relative: 2 %
HCT: 33 % — ABNORMAL LOW (ref 36.0–46.0)
Hemoglobin: 10.2 g/dL — ABNORMAL LOW (ref 12.0–15.0)
Immature Granulocytes: 1 %
Lymphocytes Relative: 17 %
Lymphs Abs: 2 10*3/uL (ref 0.7–4.0)
MCH: 28.8 pg (ref 26.0–34.0)
MCHC: 30.9 g/dL (ref 30.0–36.0)
MCV: 93.2 fL (ref 80.0–100.0)
Monocytes Absolute: 1.2 10*3/uL — ABNORMAL HIGH (ref 0.1–1.0)
Monocytes Relative: 10 %
Neutro Abs: 8.2 10*3/uL — ABNORMAL HIGH (ref 1.7–7.7)
Neutrophils Relative %: 70 %
Platelets: 198 10*3/uL (ref 150–400)
RBC: 3.54 MIL/uL — ABNORMAL LOW (ref 3.87–5.11)
RDW: 14.1 % (ref 11.5–15.5)
WBC: 11.7 10*3/uL — ABNORMAL HIGH (ref 4.0–10.5)
nRBC: 0 % (ref 0.0–0.2)

## 2022-04-16 LAB — BASIC METABOLIC PANEL
Anion gap: 7 (ref 5–15)
BUN: 55 mg/dL — ABNORMAL HIGH (ref 8–23)
CO2: 20 mmol/L — ABNORMAL LOW (ref 22–32)
Calcium: 9.1 mg/dL (ref 8.9–10.3)
Chloride: 111 mmol/L (ref 98–111)
Creatinine, Ser: 1.96 mg/dL — ABNORMAL HIGH (ref 0.44–1.00)
GFR, Estimated: 23 mL/min — ABNORMAL LOW (ref >60)
Glucose, Bld: 109 mg/dL — ABNORMAL HIGH (ref 70–99)
Potassium: 4.4 mmol/L (ref 3.5–5.1)
Sodium: 138 mmol/L (ref 135–145)

## 2022-04-16 SURGERY — CYSTOSCOPY, WITH RETROGRADE PYELOGRAM
Anesthesia: General | Site: Bladder

## 2022-04-16 MED ORDER — DULOXETINE HCL 30 MG PO CPEP
30.0000 mg | ORAL_CAPSULE | Freq: Every day | ORAL | Status: DC
  Administered 2022-04-16 – 2022-04-18 (×3): 30 mg via ORAL
  Filled 2022-04-16 (×3): qty 1

## 2022-04-16 MED ORDER — ONDANSETRON HCL 4 MG/2ML IJ SOLN: 4.0000 mg | Freq: Once | INTRAMUSCULAR | Status: DC | PRN

## 2022-04-16 MED ORDER — PREDNISONE 20 MG PO TABS: 10.0000 mg | ORAL_TABLET | Freq: Every day | ORAL | Status: DC

## 2022-04-16 MED ORDER — ORAL CARE MOUTH RINSE: 15.0000 mL | Freq: Once | OROMUCOSAL | Status: AC

## 2022-04-16 MED ORDER — FENTANYL CITRATE PF 50 MCG/ML IJ SOSY
PREFILLED_SYRINGE | INTRAMUSCULAR | Status: AC
  Filled 2022-04-16: qty 2

## 2022-04-16 MED ORDER — METHYLENE BLUE 1 % INJ SOLN
INTRAVENOUS | Status: DC | PRN
Start: 2022-04-16 — End: 2022-04-16
  Administered 2022-04-16: 25 mg via INTRAVENOUS

## 2022-04-16 MED ORDER — PANTOPRAZOLE SODIUM 40 MG PO TBEC
40.0000 mg | DELAYED_RELEASE_TABLET | Freq: Every day | ORAL | Status: DC
  Administered 2022-04-16 – 2022-04-18 (×3): 40 mg via ORAL
  Filled 2022-04-16 (×3): qty 1

## 2022-04-16 MED ORDER — DIATRIZOATE MEGLUMINE 30 % UR SOLN
URETHRAL | Status: DC | PRN
  Administered 2022-04-16: 20 mL via URETHRAL

## 2022-04-16 MED ORDER — ONDANSETRON HCL 4 MG/2ML IJ SOLN
INTRAMUSCULAR | Status: DC | PRN
  Administered 2022-04-16: 4 mg via INTRAVENOUS

## 2022-04-16 MED ORDER — LACTATED RINGERS IV SOLN: INTRAVENOUS | Status: DC

## 2022-04-16 MED ORDER — ONDANSETRON HCL 4 MG/2ML IJ SOLN
INTRAMUSCULAR | Status: AC
  Filled 2022-04-16: qty 2

## 2022-04-16 MED ORDER — FENTANYL CITRATE (PF) 100 MCG/2ML IJ SOLN
INTRAMUSCULAR | Status: DC | PRN
  Administered 2022-04-16: 25 ug via INTRAVENOUS
  Administered 2022-04-16: 50 ug via INTRAVENOUS

## 2022-04-16 MED ORDER — METHYLENE BLUE 1 % INJ SOLN
INTRAVENOUS | Status: AC
  Filled 2022-04-16: qty 10

## 2022-04-16 MED ORDER — HYDROMORPHONE HCL 1 MG/ML IJ SOLN
INTRAMUSCULAR | Status: AC
  Filled 2022-04-16: qty 0.5

## 2022-04-16 MED ORDER — METOPROLOL SUCCINATE ER 25 MG PO TB24
25.0000 mg | ORAL_TABLET | Freq: Every day | ORAL | Status: DC
  Administered 2022-04-16 – 2022-04-18 (×3): 25 mg via ORAL
  Filled 2022-04-16 (×3): qty 1

## 2022-04-16 MED ORDER — FENTANYL CITRATE (PF) 100 MCG/2ML IJ SOLN
INTRAMUSCULAR | Status: AC
  Filled 2022-04-16: qty 2

## 2022-04-16 MED ORDER — FENTANYL CITRATE PF 50 MCG/ML IJ SOSY
25.0000 ug | PREFILLED_SYRINGE | INTRAMUSCULAR | Status: DC | PRN
  Administered 2022-04-16 (×4): 50 ug via INTRAVENOUS

## 2022-04-16 MED ORDER — TRAMADOL HCL 50 MG PO TABS
50.0000 mg | ORAL_TABLET | Freq: Four times a day (QID) | ORAL | Status: DC | PRN
  Administered 2022-04-16 – 2022-04-18 (×4): 50 mg via ORAL
  Filled 2022-04-16 (×4): qty 1

## 2022-04-16 MED ORDER — HYDROMORPHONE HCL 1 MG/ML IJ SOLN
0.5000 mg | Freq: Once | INTRAMUSCULAR | Status: AC
  Administered 2022-04-16: 0.5 mg via INTRAVENOUS

## 2022-04-16 MED ORDER — FENTANYL CITRATE (PF) 250 MCG/5ML IJ SOLN
INTRAMUSCULAR | Status: AC
Start: 2022-04-16 — End: ?
  Filled 2022-04-16: qty 5

## 2022-04-16 MED ORDER — CEFDINIR 300 MG PO CAPS
300.0000 mg | ORAL_CAPSULE | Freq: Two times a day (BID) | ORAL | Status: DC
  Administered 2022-04-16 – 2022-04-18 (×4): 300 mg via ORAL
  Filled 2022-04-16 (×4): qty 1

## 2022-04-16 MED ORDER — OXYBUTYNIN CHLORIDE 5 MG PO TABS
5.0000 mg | ORAL_TABLET | Freq: Three times a day (TID) | ORAL | Status: DC | PRN
  Administered 2022-04-17 (×2): 5 mg via ORAL
  Filled 2022-04-16 (×3): qty 1

## 2022-04-16 MED ORDER — LIDOCAINE HCL (PF) 2 % IJ SOLN
INTRAMUSCULAR | Status: AC
  Filled 2022-04-16: qty 5

## 2022-04-16 MED ORDER — CEFAZOLIN SODIUM-DEXTROSE 2-4 GM/100ML-% IV SOLN
2.0000 g | INTRAVENOUS | Status: AC
  Administered 2022-04-16: 2 g via INTRAVENOUS

## 2022-04-16 MED ORDER — SODIUM BICARBONATE 650 MG PO TABS
650.0000 mg | ORAL_TABLET | Freq: Two times a day (BID) | ORAL | Status: DC
  Administered 2022-04-16 – 2022-04-18 (×4): 650 mg via ORAL
  Filled 2022-04-16 (×4): qty 1

## 2022-04-16 MED ORDER — DIATRIZOATE MEGLUMINE 30 % UR SOLN
URETHRAL | Status: AC
  Filled 2022-04-16: qty 100

## 2022-04-16 MED ORDER — EPHEDRINE SULFATE (PRESSORS) 50 MG/ML IJ SOLN
INTRAMUSCULAR | Status: DC | PRN
  Administered 2022-04-16 (×2): 10 mg via INTRAVENOUS

## 2022-04-16 MED ORDER — OXYCODONE-ACETAMINOPHEN 5-325 MG PO TABS
1.0000 | ORAL_TABLET | ORAL | Status: DC | PRN
  Administered 2022-04-16 – 2022-04-18 (×5): 1 via ORAL
  Filled 2022-04-16 (×5): qty 1

## 2022-04-16 MED ORDER — PROPOFOL 10 MG/ML IV BOLUS
INTRAVENOUS | Status: AC
  Filled 2022-04-16: qty 20

## 2022-04-16 MED ORDER — PROPOFOL 10 MG/ML IV BOLUS
INTRAVENOUS | Status: DC | PRN
Start: 2022-04-16 — End: 2022-04-16
  Administered 2022-04-16: 120 mg via INTRAVENOUS

## 2022-04-16 MED ORDER — ACETAMINOPHEN 325 MG PO TABS: 650.0000 mg | ORAL_TABLET | Freq: Four times a day (QID) | ORAL | Status: DC | PRN

## 2022-04-16 MED ORDER — DEXTROSE-NACL 5-0.45 % IV SOLN: INTRAVENOUS | Status: DC

## 2022-04-16 MED ORDER — STERILE WATER FOR IRRIGATION IR SOLN
Status: DC | PRN
  Administered 2022-04-16: 500 mL

## 2022-04-16 MED ORDER — CHLORHEXIDINE GLUCONATE 0.12 % MT SOLN
15.0000 mL | Freq: Once | OROMUCOSAL | Status: AC
  Administered 2022-04-16: 15 mL via OROMUCOSAL

## 2022-04-16 MED ORDER — ONDANSETRON 4 MG PO TBDP: 4.0000 mg | ORAL_TABLET | Freq: Four times a day (QID) | ORAL | Status: DC | PRN

## 2022-04-16 MED ORDER — SODIUM CHLORIDE 0.9 % IR SOLN
Status: DC | PRN
  Administered 2022-04-16 (×13): 3000 mL

## 2022-04-16 MED ORDER — ALPRAZOLAM 0.5 MG PO TABS
0.5000 mg | ORAL_TABLET | Freq: Every evening | ORAL | Status: DC | PRN
  Administered 2022-04-16 – 2022-04-17 (×2): 0.5 mg via ORAL
  Filled 2022-04-16 (×2): qty 1

## 2022-04-16 MED ORDER — ACETAMINOPHEN 650 MG RE SUPP: 650.0000 mg | Freq: Four times a day (QID) | RECTAL | Status: DC | PRN

## 2022-04-16 MED ORDER — DULOXETINE HCL 60 MG PO CPEP
60.0000 mg | ORAL_CAPSULE | Freq: Every day | ORAL | Status: DC
Start: 2022-04-16 — End: 2022-04-18
  Administered 2022-04-16 – 2022-04-18 (×3): 60 mg via ORAL
  Filled 2022-04-16 (×3): qty 1

## 2022-04-16 MED ORDER — SODIUM CHLORIDE 0.9 % IR SOLN
3000.0000 mL | Status: DC
  Administered 2022-04-16 (×3): 3000 mL

## 2022-04-16 MED ORDER — CEFAZOLIN SODIUM-DEXTROSE 2-4 GM/100ML-% IV SOLN
INTRAVENOUS | Status: AC
  Filled 2022-04-16: qty 100

## 2022-04-16 MED ORDER — STERILE WATER FOR IRRIGATION IR SOLN
Status: DC | PRN
  Administered 2022-04-16: 1000 mL

## 2022-04-16 MED ORDER — LEVOTHYROXINE SODIUM 75 MCG PO TABS
75.0000 ug | ORAL_TABLET | Freq: Every day | ORAL | Status: DC
  Administered 2022-04-17 – 2022-04-18 (×2): 75 ug via ORAL
  Filled 2022-04-16 (×2): qty 1

## 2022-04-16 MED ORDER — LIDOCAINE HCL (CARDIAC) PF 50 MG/5ML IV SOSY
PREFILLED_SYRINGE | INTRAVENOUS | Status: DC | PRN
  Administered 2022-04-16: 75 mg via INTRAVENOUS

## 2022-04-16 MED ORDER — ONDANSETRON HCL 4 MG/2ML IJ SOLN: 4.0000 mg | Freq: Four times a day (QID) | INTRAMUSCULAR | Status: DC | PRN

## 2022-04-16 SURGICAL SUPPLY — 32 items
BAG DRAIN URO TABLE W/ADPT NS (BAG) ×3 IMPLANT
BAG DRN 8 ADPR NS SKTRN CSTL (BAG) ×2
BAG DRN RND TRDRP ANRFLXCHMBR (UROLOGICAL SUPPLIES) ×2
BAG DRN URN TUBE DRIP CHMBR (OSTOMY) ×2
BAG HAMPER (MISCELLANEOUS) ×3 IMPLANT
BAG URINE DRAIN 2000ML AR STRL (UROLOGICAL SUPPLIES) ×3 IMPLANT
BAG URINE DRAIN TURP 4L (OSTOMY) ×1 IMPLANT
CATH HEMA 3WAY 30CC 24FR COUDE (CATHETERS) ×1 IMPLANT
CATH URET 5FR 28IN OPEN ENDED (CATHETERS) ×3 IMPLANT
CLOTH BEACON ORANGE TIMEOUT ST (SAFETY) ×3 IMPLANT
COVER MAYO STAND XLG (MISCELLANEOUS) ×3 IMPLANT
ELECT LOOP 22F BIPOLAR SML (ELECTROSURGICAL) ×3
ELECTRODE LOOP 22F BIPOLAR SML (ELECTROSURGICAL) IMPLANT
EVACUATOR MICROVAS BLADDER (UROLOGICAL SUPPLIES) ×1 IMPLANT
GLOVE BIO SURGEON STRL SZ8 (GLOVE) ×3 IMPLANT
GLOVE BIOGEL PI IND STRL 7.0 (GLOVE) ×4 IMPLANT
GLOVE BIOGEL PI INDICATOR 7.0 (GLOVE) ×2
GOWN STRL REUS W/TWL LRG LVL3 (GOWN DISPOSABLE) ×3 IMPLANT
GOWN STRL REUS W/TWL XL LVL3 (GOWN DISPOSABLE) ×3 IMPLANT
GUIDEWIRE STR DUAL SENSOR (WIRE) ×1 IMPLANT
IV NS IRRIG 3000ML ARTHROMATIC (IV SOLUTION) ×16 IMPLANT
KIT TURNOVER CYSTO (KITS) ×3 IMPLANT
MANIFOLD NEPTUNE II (INSTRUMENTS) ×3 IMPLANT
NS IRRIG 1000ML POUR BTL (IV SOLUTION) ×1 IMPLANT
PACK CYSTO (CUSTOM PROCEDURE TRAY) ×3 IMPLANT
PAD ARMBOARD 7.5X6 YLW CONV (MISCELLANEOUS) ×3 IMPLANT
PLUG CATH AND CAP STER (CATHETERS) ×3 IMPLANT
SYR 30ML LL (SYRINGE) ×3 IMPLANT
SYR TOOMEY IRRIG 70ML (MISCELLANEOUS) ×3
SYRINGE TOOMEY IRRIG 70ML (MISCELLANEOUS) ×2 IMPLANT
TOWEL OR 17X26 4PK STRL BLUE (TOWEL DISPOSABLE) ×3 IMPLANT
WATER STERILE IRR 500ML POUR (IV SOLUTION) ×3 IMPLANT

## 2022-04-16 NOTE — Consult Note (Addendum)
Initial Consultation Note ? ? ?Patient: Jane Andrews STM:[id number] DOB: 1925-01-22 PCP: Redmond School, MD ?DOA: 04/16/2022 ?DOS: the patient was seen and examined on 04/16/2022 ?Primary service: Primus Bravo., MD ? ?Referring physician: Dr. Michaelle Birks  ?Reason for consult: Medical management post-op  ? ?Assessment/Plan: ? ?Bladder mass ?-Status post cystoscopy with transurethral resection of bladder tumor 5/3  ?-Pathology pending  ?-CBI  ?-Per urology  ? ?CKD stage 4 ?-Baseline Cr 1.8 ?-Repeat BMP in AM  ? ?Anxiety ?-Xanax, Cymbalta  ? ?Hypothyroidism ?-Synthroid ? ?HTN ?-Toprol ? ?GERD ?-Protonix ? ?Right MCP pain ?-She was placed on prednisone for 10 days by her PCP on 4/24  ? ? ? ?[facility] will continue to follow the patient. ? ?HPI: Jane Andrews is a 88 y.o. female with past medical history of bladder mass, history of UTIs, anxiety and depression, hypothyroidism, hypertension who presented to [facility] for cystoscopy, transurethral resection of bladder tumor by Dr. Michaelle Birks.  Postoperatively, [facility] hospitalist service consulted for medical management. ? ?She had complaints of dysuria since January 2023 with multiple urine cultures showing mixed flora.  She saw Dr. Felipa Eth as an outpatient and underwent cystoscopy which revealed a bladder mass.  At that time, patient was recommended for cystoscopy with bilateral retrograde pyelogram, transurethral resection of bladder tumor. ? ?Patient was seen in PACU.  She complains of pelvic discomfort but otherwise feeling well.  Denies any nausea. ? ?Review of Systems: As mentioned in the history of present illness. All other systems reviewed and are negative. ?Past Medical History:  ?Diagnosis Date  ? Abdominal adhesions 1994  ? Allergic rhinitis   ? Anemia   ? Anxiety and depression   ? Aortic stenosis   ? Arthritis   ? Atrial fibrillation ([place]) 10/2012  ? Associated with severe anemia and esophageal pill impaction  ? Breast  carcinoma ([place])   ? Right mastectomy  ? Cholelithiasis   ? Essential hypertension   ? Gastroesophageal reflux disease   ? Hiatal hernia  ? History of blood transfusion   ? Hypothyroidism   ? Low back pain   ? Malabsorption   ? 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 Nephrolithiasis 2004  ? Painless hematuria  ? Short gut syndrome   ? Bowel resection , 2004  ? Upper GI bleed 2004  ? Multiple episodes of melena-? due to gastritis or adverse drug effect (nonsteroidals, small bowel ulceration with Fosamax); caused by Pepto-Bismol during one Emergency Department evaluation  ? ?Past Surgical History:  ?Procedure Laterality Date  ? ABDOMINAL HYSTERECTOMY    ? emergency s/p delivery  ? ABDOMINAL HYSTERECTOMY  1960  ? massive gynecologic bleeding  ? BOWEL RESECTION    ? Resulting short gut syndrome  ? CHOLECYSTECTOMY N/A 06/25/2018  ? Procedure: LAPAROSCOPIC CHOLECYSTECTOMY WITH INTRAOPERATIVE CHOLANGIOGRAM;  Surgeon: Armandina Gemma, MD;  [facility];  Service: General;  Laterality: N/A;  ? COLONOSCOPY W/ POLYPECTOMY  2005  ? Lipoma; diverticulosis  ? COLONOSCOPY WITH ESOPHAGOGASTRODUODENOSCOPY (EGD)  11/22/2012  ? Rehman  ? ESOPHAGOGASTRODUODENOSCOPY (EGD) WITH PROPOFOL N/A 04/04/2022  ? Procedure: ESOPHAGOGASTRODUODENOSCOPY (EGD) WITH PROPOFOL;  Surgeon: Rogene Houston, MD;  [facility];  Service: Endoscopy;  Laterality: N/A;  210  ? HIP ARTHROPLASTY Right 06/15/2020  ? Procedure: ANTERIOR ARTHROPLASTY BIPOLAR HIP (HEMIARTHROPLASTY);  Surgeon: Mcarthur Rossetti, MD;  Location[facility];  Service: Orthopedics;  Laterality: Right;  ? [place]  ? s/p adhesions  ?  MASTECTOMY  right breast  ? MASTECTOMY    ? Carcinoma of the breast; right  ? UPPER GASTROINTESTINAL ENDOSCOPY    ? ?Social History:  reports that she has quit smoking. Her smoking use included cigarettes. She has a 30.00 pack-year smoking history. She has never used smokeless tobacco. She reports  that she does not drink alcohol and does not use drugs. ? ?Allergies  ?Allergen Reactions  ? Codeine Nausea And Vomiting  ? ? ?Family History  ?Problem Relation Age of Onset  ? Anuerysm Father   ? Rheum arthritis Sister   ? Healthy Sister   ? COPD Sister   ? Healthy Brother   ? Cancer Other   ? Colon cancer Neg Hx   ? ? ?Prior to Admission medications   ?Medication Sig Start Date End Date Taking? Authorizing Provider  ?ALPRAZolam (XANAX) 0.5 MG tablet Take 0.5 mg by mouth at bedtime as needed for anxiety. 12/19/21  Yes [provider]  ?Bacillus Coagulans-Inulin (PROBIOTIC FORMULA PO) Take 1 tablet by mouth daily.   Yes [provider]  ?cyanocobalamin (,VITAMIN B-12,) 1000 MCG/ML injection Inject 1,000 mcg into the muscle every 30 (thirty) days. 07/16/20  Yes [provider]  ?diphenoxylate-atropine (LOMOTIL) 2.5-0.025 MG tablet Take 1 tablet by mouth 4 (four) times daily as needed for diarrhea or loose stools.   Yes [provider]  ?DULoxetine (CYMBALTA) 30 MG capsule Take 30 mg by mouth daily. Take with 60 mg for a total of 90 mg daily   Yes [provider]  ?DULoxetine (CYMBALTA) 60 MG capsule Take 60 mg by mouth See admin instructions. Take with 30 mg for a total of 90 mg daily 03/30/22  Yes [provider]  ?levothyroxine (SYNTHROID) 75 MCG tablet Take 1 tablet (75 mcg total) by mouth daily before breakfast. 07/09/20  Yes Gerlene Fee, NP  ?metoprolol succinate (TOPROL-XL) 25 MG 24 hr tablet Take 1 tablet (25 mg total) by mouth daily. 07/09/20  Yes Gerlene Fee, NP  ?Multiple Vitamins-Iron (MULTIVITAMINS WITH IRON) TABS tablet Take 1 tablet by mouth daily. 06/19/20  Yes [provider]  ?pantoprazole (PROTONIX) 40 MG tablet Take 1 tablet (40 mg total) by mouth 2 (two) times daily. ?Patient taking differently: Take 40 mg by mouth daily. 07/09/20  Yes Gerlene Fee, NP  ?predniSONE (DELTASONE) 10 MG tablet Take 1 tablet (10 mg total) by mouth daily  for 10 days. 04/07/22 04/17/22 Yes Carole Civil, MD  ?sodium bicarbonate 650 MG tablet Take 650 mg by mouth 2 (two) times daily. 09/20/20  Yes [provider]  ?traMADol (ULTRAM) 50 MG tablet Take 1 tablet (50 mg total) by mouth every 6 (six) hours. ?Patient taking differently: Take 50 mg by mouth every 6 (six) hours as needed for moderate pain or severe pain. 07/09/20  Yes Gerlene Fee, NP  ?vitamin C (ASCORBIC ACID) 500 MG tablet Take 500 mg by mouth daily.   Yes [provider]  ?VITAMIN D PO Take by mouth daily.   Yes [provider]  ?amoxicillin-clavulanate (AUGMENTIN) 875-125 MG tablet Take 1 tablet by mouth every 12 (twelve) hours. 03/27/22   Summerlin, Berneice Heinrich, PA-C  ?fluticasone (FLONASE) 50 MCG/ACT nasal spray Place 1 spray into both nostrils 2 (two) times daily. ?Patient taking differently: Place 1 spray into both nostrils daily as needed for rhinitis. 02/08/22   Volney American, PA-C  ? ? ?Physical Exam: ?Vitals:  ? 04/16/22 0920 04/16/22 1317 04/16/22 1330 04/16/22  1400  ?BP: (!) 165/86 132/66 (!) 133/96 133/68  ?Pulse: 69 89 89 82  ?Resp: '16 16 13 14  '$ ?Temp: 97.8 ?F (36.6 ?C) (!) 97.5 ?F (36.4 ?C)    ?TempSrc: Oral     ?SpO2: 99% 100% 100% 100%  ? ?Examination: ?General exam: Appears calm and comfortable  ?Respiratory system: Clear to auscultation. Respiratory effort normal. ?Cardiovascular system: S1 & S2 heard, RRR. No pedal edema. ?Gastrointestinal system: Abdomen is nondistended, soft and nontender. Normal bowel sounds heard. ?Central nervous system: Alert and oriented. Non focal exam. Speech clear  ?Extremities: Symmetric in appearance bilaterally  ?Skin: No rashes, lesions or ulcers on exposed skin  ?Psychiatry: Judgement and insight appear stable. Mood & affect appropriate.  ? ?Data Reviewed:  ? ?Labs pending in AM    ? ? ?Family Communication: None at bedside in PACU  ?Primary team communication: Discussed via phone with Dr. Felipa Eth.  ?Thank you  very much for involving [name] in the care of your patient. ? ?Author: ?Dessa Phi, DO ?04/16/2022 2:23 PM ? ?For on call review [email or web address].  ?

## 2022-04-16 NOTE — Op Note (Signed)
OPERATIVE NOTE ? ? ?Patient Name: Jane Andrews ? ?MRN:  [id number] ? ?Date of Procedure: 04/16/22 ? ?Preoperative diagnosis:  ?Bladder tumor ?Dysuria ? ?Postoperative diagnosis:  ?Bladder tumor, large ?Dysuria ? ?Procedure:  ?Cystoscopy ?Transurethral resection of bladder tumor large (>5 cm) ? ?Attending: Primus Bravo, MD ? ?Anesthesia: General ? ?Estimated blood loss: 30 ml ? ?Fluids: Per anesthesia record ? ?Drains: 108F 3 way foley catheter ? ?Specimens: Bladder tumor ? ?Antibiotics: Ancef 2 g ? ?Findings: Large (greater than 5 cm), necrotic tumor involving trigone and extending posteriorly; small papillary lesions lateral to large tumor; UO's not seen ? ?Indications:  ?88 year old female with a several month history of dysuria.  She has been evaluated on multiple occasions for UTIs however, urine cultures have not shown a definite organism.  She was evaluated with cystoscopy in the office which showed a sessile appearing mass at the bladder neck in the area of the trigone with inflammatory changes.  The exam was limited due to patient discomfort.  Ureteral orifices were not identified.  Urine cytology sent and is pending.  She presents now for further evaluation with cystoscopy, bilateral retrograde pyelograms, and transurethral resection of bladder tumor.  Risk and benefits of the procedure were discussed in detail.  She understands wishes to proceed as described. ? ?Description of Procedure:  ?The patient received IV Ancef preoperatively.  After successful induction of a general anesthetic, the patient was placed in the lithotomy position.  The patient's genital areas prepped and draped in sterile fashion.  Under direct visualization, a 21 French rigid cystoscope was passed through the urethra and into the bladder.  A large sessile appearing tumor was noted at the trigone extending posteriorly and laterally.  There was a mass effect on the bladder.  Necrotic appearing tissue was noted in  association with the tumor.  Smaller papillary lesions were seen lateral to the larger tumor.  I was unable to identify either ureteral orifice.  The patient was given methylene blue to assist in identification.   ? ?Scout film showed a large staghorn calculus in the right kidney.  A nonspecific bowel gas pattern was appreciated.  A right hip prosthesis was also noted.  Retrograde pyelograms were not performed at this time.   ? ?During evaluation, bleeding was noted from the tumor.  The urethra was gently dilated to 28 [place] using urethral sounds.  A continuous-flow resectoscope sheath was placed under direct visualization.  Using the bipolar loop, transurethral resection of the bladder tumor was undertaken.  The tumor had significant necrosis associated with it.  I was unable to easily determine the depth of the tumor.  The overall size of the tumor was >5 cm.  Hemostasis was obtained with the bipolar loop.  I again inspected the bladder for the UOs.  I was unable to visualize either orifice.  No efflux of urine or methylene blue was appreciated.  Consequently, retrograde pyelograms were not performed.  An Ellik evacuator was used to irrigate resected tissue and blood clot from the bladder.  Inspection of the resection site showed no evidence of bleeding.  There was no evidence of bladder perforation.  Bimanual examination demonstrated a palpable mass in the anterior vaginal vault.  A 24 French three-way Foley catheter was placed with 30 mL of sterile water in the balloon.  Gentle CBI was started.  The patient was then extubated and taken to the post anesthesia care unit in stable condition. ? ?Complications: None ? ?Condition: Stable, extubated, transferred to  PACU ? ?Plan:  ?We will admit for observation. ?Renal ultrasound to evaluate for hydronephrosis. ?She may need placement of bilateral nephrostomy tubes if there is evidence of obstruction. ?I discussed the findings and the recommendations with the patient's  niece postoperatively. ?

## 2022-04-16 NOTE — Transfer of Care (Signed)
Immediate Anesthesia Transfer of Care Note ? ?Patient: Jane Andrews ? ?Procedure(s) Performed: CYSTOSCOPY WITH RETROGRADE PYELOGRAM (Bilateral) ?TRANSURETHRAL RESECTION OF BLADDER TUMOR (TURBT) ? ?Patient Location: PACU ? ?Anesthesia Type:General ? ?Level of Consciousness: awake ? ?Airway & Oxygen Therapy: Patient connected to nasal cannula oxygen ? ?Post-op Assessment: Report given to RN ? ?Post vital signs: Reviewed and stable ? ?Last Vitals:  ?Vitals Value Taken Time  ?BP 132/66 04/16/22 1317  ?Temp    ?Pulse 90 04/16/22 1318  ?Resp 16 04/16/22 1318  ?SpO2 100 % 04/16/22 1318  ?Vitals shown include unvalidated device data. ? ?Last Pain:  ?Vitals:  ? 04/16/22 0920  ?TempSrc: Oral  ?PainSc: 0-No pain  ?   ? ?Patients Stated Pain Goal: 6 (04/16/22 0920) ? ?Complications: No notable events documented. ?

## 2022-04-16 NOTE — Anesthesia Procedure Notes (Signed)
Procedure Name: LMA Insertion ?Date/Time: 04/16/2022 11:05 AM ?Performed by: Ollen Bowl, CRNA ?Pre-anesthesia Checklist: Patient identified, Patient being monitored, Emergency Drugs available, Timeout performed and Suction available ?Patient Re-evaluated:Patient Re-evaluated prior to induction ?Oxygen Delivery Method: Circle System Utilized ?Preoxygenation: Pre-oxygenation with 100% oxygen ?Induction Type: IV induction ?Ventilation: Mask ventilation without difficulty ?LMA: LMA inserted ?LMA Size: 4.0 ?Number of attempts: 1 ?Placement Confirmation: positive ETCO2 and breath sounds checked- equal and bilateral ? ? ? ? ?

## 2022-04-16 NOTE — Interval H&P Note (Signed)
History and Physical Interval Note: ? ?04/16/2022 ?10:21 AM ? ?Jane Andrews  has presented today for surgery, with the diagnosis of bladder mass.  The various methods of treatment have been discussed with the patient and family. After consideration of risks, benefits and other options for treatment, the patient has consented to  Procedure(s): ?CYSTOSCOPY WITH RETROGRADE PYELOGRAM (Bilateral) ?TRANSURETHRAL RESECTION OF BLADDER TUMOR (TURBT) (N/A) as a surgical intervention.  The patient's history has been reviewed, patient examined, no change in status, stable for surgery.  I have reviewed the patient's chart and labs.  Questions were answered to the patient's satisfaction.   ? ? ?Leory Plowman Francis Yardley ? ? ?

## 2022-04-16 NOTE — Anesthesia Preprocedure Evaluation (Signed)
Anesthesia Evaluation  ?Patient identified by MRN, date of birth, ID band ?Patient awake ? ? ? ?Reviewed: ?Allergy & Precautions, H&P , NPO status , Patient's Chart, lab work & pertinent test results, reviewed documented beta blocker date and time  ? ?Airway ?Mallampati: II ? ?TM Distance: >3 FB ?Neck ROM: full ? ? ? Dental ?no notable dental hx. ? ?  ?Pulmonary ?neg pulmonary ROS, former smoker,  ?  ?Pulmonary exam normal ?breath sounds clear to auscultation ? ? ? ? ? ? Cardiovascular ?Exercise Tolerance: Good ?hypertension, negative cardio ROS ? ? ?Rhythm:regular Rate:Normal ? ? ?  ?Neuro/Psych ?PSYCHIATRIC DISORDERS Anxiety Depression negative neurological ROS ?   ? GI/Hepatic ?Neg liver ROS, GERD  Medicated,  ?Endo/Other  ?Hypothyroidism  ? Renal/GU ?CRFRenal disease  ?negative genitourinary ?  ?Musculoskeletal ? ? Abdominal ?  ?Peds ? Hematology ? ?(+) Blood dyscrasia, anemia ,   ?Anesthesia Other Findings ? ? Reproductive/Obstetrics ?negative OB ROS ? ?  ? ? ? ? ? ? ? ? ? ? ? ? ? ?  ?  ? ? ? ? ? ? ? ? ?Anesthesia Physical ?Anesthesia Plan ? ?ASA: 3 ? ?Anesthesia Plan: General and General LMA  ? ?Post-op Pain Management:   ? ?Induction:  ? ?PONV Risk Score and Plan:  ? ?Airway Management Planned:  ? ?Additional Equipment:  ? ?Intra-op Plan:  ? ?Post-operative Plan:  ? ?Informed Consent: I have reviewed the patients History and Physical, chart, labs and discussed the procedure including the risks, benefits and alternatives for the proposed anesthesia with the patient or authorized representative who has indicated his/her understanding and acceptance.  ? ? ? ?Dental Advisory Given ? ?Plan Discussed with: CRNA ? ?Anesthesia Plan Comments:   ? ? ? ? ? ? ?Anesthesia Quick Evaluation ? ?

## 2022-04-17 DIAGNOSIS — N3289 Other specified disorders of bladder: Secondary | ICD-10-CM | POA: Diagnosis not present

## 2022-04-17 DIAGNOSIS — N189 Chronic kidney disease, unspecified: Secondary | ICD-10-CM | POA: Diagnosis not present

## 2022-04-17 DIAGNOSIS — N1330 Unspecified hydronephrosis: Secondary | ICD-10-CM

## 2022-04-17 DIAGNOSIS — N200 Calculus of kidney: Secondary | ICD-10-CM | POA: Diagnosis not present

## 2022-04-17 DIAGNOSIS — C670 Malignant neoplasm of trigone of bladder: Secondary | ICD-10-CM

## 2022-04-17 LAB — CBC
HCT: 32.2 % — ABNORMAL LOW (ref 36.0–46.0)
Hemoglobin: 10.2 g/dL — ABNORMAL LOW (ref 12.0–15.0)
MCH: 29.7 pg (ref 26.0–34.0)
MCHC: 31.7 g/dL (ref 30.0–36.0)
MCV: 93.9 fL (ref 80.0–100.0)
Platelets: 177 10*3/uL (ref 150–400)
RBC: 3.43 MIL/uL — ABNORMAL LOW (ref 3.87–5.11)
RDW: 14.3 % (ref 11.5–15.5)
WBC: 10.6 10*3/uL — ABNORMAL HIGH (ref 4.0–10.5)
nRBC: 0 % (ref 0.0–0.2)

## 2022-04-17 LAB — BASIC METABOLIC PANEL
Anion gap: 6 (ref 5–15)
BUN: 44 mg/dL — ABNORMAL HIGH (ref 8–23)
CO2: 22 mmol/L (ref 22–32)
Calcium: 8.6 mg/dL — ABNORMAL LOW (ref 8.9–10.3)
Chloride: 112 mmol/L — ABNORMAL HIGH (ref 98–111)
Creatinine, Ser: 2.1 mg/dL — ABNORMAL HIGH (ref 0.44–1.00)
GFR, Estimated: 21 mL/min — ABNORMAL LOW (ref >60)
Glucose, Bld: 100 mg/dL — ABNORMAL HIGH (ref 70–99)
Potassium: 4.5 mmol/L (ref 3.5–5.1)
Sodium: 140 mmol/L (ref 135–145)

## 2022-04-17 MED ORDER — CHLORHEXIDINE GLUCONATE CLOTH 2 % EX PADS
6.0000 | MEDICATED_PAD | Freq: Every day | CUTANEOUS | Status: DC
  Administered 2022-04-17 – 2022-04-18 (×2): 6 via TOPICAL

## 2022-04-17 MED ORDER — SODIUM CHLORIDE 0.9 % IR SOLN
3000.0000 mL | Status: DC
  Administered 2022-04-17 (×3): 3000 mL

## 2022-04-17 NOTE — Progress Notes (Signed)
100 ml of Bloody urine without clots noted. MD Golden Hurter requested to start CBI again.  ?

## 2022-04-17 NOTE — Anesthesia Postprocedure Evaluation (Signed)
Anesthesia Post Note ? ?Patient: Jane Andrews ? ?Procedure(s) Performed: CYSTOSCOPY (Bilateral: Bladder) ?TRANSURETHRAL RESECTION OF BLADDER TUMOR (TURBT) (Bladder) ? ?Patient location during evaluation: Phase II ?Anesthesia Type: General ?Level of consciousness: awake ?Pain management: pain level controlled ?Vital Signs Assessment: post-procedure vital signs reviewed and stable ?Respiratory status: spontaneous breathing and respiratory function stable ?Cardiovascular status: blood pressure returned to baseline and stable ?Postop Assessment: no headache and no apparent nausea or vomiting ?Anesthetic complications: no ?Comments: Late entry ? ? ?No notable events documented. ? ? ?Last Vitals:  ?Vitals:  ? 04/17/22 0201 04/17/22 0528  ?BP: 135/72 (!) 146/75  ?Pulse: 80 68  ?Resp: 18 18  ?Temp: 36.7 ?C 36.4 ?C  ?SpO2: 99% 99%  ?  ?Last Pain:  ?Vitals:  ? 04/17/22 0201  ?TempSrc: Oral  ?PainSc:   ? ? ?  ?  ?  ?  ?  ?  ? ?Louann Sjogren ? ? ? ? ?

## 2022-04-17 NOTE — TOC Progression Note (Signed)
?  Transition of Care (TOC) Screening Note ? ? ?Patient Details  ?Name: Jane Andrews ?Date of Birth: [birth year] ? ? ?Transition of Care (TOC) CM/SW Contact:    ?Shade Flood, LCSW ?Phone Number: ?04/17/2022, 9:11 AM ? ? ? ?Transition of Care Department [facility]) has reviewed patient and no TOC needs have been identified at this time. We will continue to monitor patient advancement through interdisciplinary progression rounds. If new patient transition needs arise, please place a TOC consult. ? ? ?

## 2022-04-17 NOTE — Progress Notes (Signed)
Bladder irrigation stopped Per MD verbal request.  ?

## 2022-04-17 NOTE — Progress Notes (Signed)
Urology Inpatient Progress Note ? ?Subjective: ?C/o bladder spasms.  Urine clear with minimal CBI.   ?Cr slightly increased to 2.10 this AM. ?Renal U/S from 04/16/22 showed mild bilateral hydronephrosis. ?She has had some intermittent right flank pain. ? ?Anti-infectives: ?Anti-infectives (From admission, onward)  ? ? Start     Dose/Rate Route Frequency Ordered Stop  ? 04/16/22 2200  cefdinir (OMNICEF) capsule 300 mg       ? 300 mg Oral Every 12 hours 04/16/22 1521    ? 04/16/22 0924  ceFAZolin (ANCEF) 2-4 GM/100ML-% IVPB       ?Note to Pharmacy: Wilmer Floor: cabinet override  ?    04/16/22 0924 04/16/22 1117  ? 04/16/22 0922  ceFAZolin (ANCEF) IVPB 2g/100 mL premix       ? 2 g ?200 mL/hr over 30 Minutes Intravenous 30 min pre-op 04/16/22 3419 04/16/22 1110  ? ?  ? ? ?Current Facility-Administered Medications  ?Medication Dose Route Frequency Provider Last Rate Last Admin  ? acetaminophen (TYLENOL) tablet 650 mg  650 mg Oral Q6H PRN Merryn Thaker, Reece Leader., MD      ? Or  ? acetaminophen (TYLENOL) suppository 650 mg  650 mg Rectal Q6H PRN Salvator Seppala, Reece Leader., MD      ? ALPRAZolam Duanne Moron) tablet 0.5 mg  0.5 mg Oral QHS PRN Primus Bravo., MD   0.5 mg at 04/16/22 2113  ? cefdinir (OMNICEF) capsule 300 mg  300 mg Oral Q12H Sanaii Caporaso, Reece Leader., MD   300 mg at 04/16/22 2113  ? dextrose 5 %-0.45 % sodium chloride infusion   Intravenous Continuous Primus Bravo., MD 75 mL/hr at 04/17/22 0437 New Bag at 04/17/22 0437  ? DULoxetine (CYMBALTA) DR capsule 30 mg  30 mg Oral Daily Primus Bravo., MD   30 mg at 04/16/22 1541  ? DULoxetine (CYMBALTA) DR capsule 60 mg  60 mg Oral Daily Primus Bravo., MD   60 mg at 04/16/22 1540  ? levothyroxine (SYNTHROID) tablet 75 mcg  75 mcg Oral Q0600 Primus Bravo., MD   75 mcg at 04/17/22 3790  ? metoprolol succinate (TOPROL-XL) 24 hr tablet 25 mg  25 mg Oral Daily Primus Bravo., MD   25 mg at 04/16/22 1542  ? ondansetron (ZOFRAN-ODT)  disintegrating tablet 4 mg  4 mg Oral Q6H PRN Kavita Bartl, Reece Leader., MD      ? Or  ? ondansetron (ZOFRAN) injection 4 mg  4 mg Intravenous Q6H PRN Rasha Ibe, Reece Leader., MD      ? oxybutynin (DITROPAN) tablet 5 mg  5 mg Oral TID PRN Primus Bravo., MD   5 mg at 04/17/22 2409  ? oxyCODONE-acetaminophen (PERCOCET/ROXICET) 5-325 MG per tablet 1 tablet  1 tablet Oral Q4H PRN Dessa Phi, DO   1 tablet at 04/16/22 1542  ? pantoprazole (PROTONIX) EC tablet 40 mg  40 mg Oral Daily Primus Bravo., MD   40 mg at 04/16/22 1738  ? sodium bicarbonate tablet 650 mg  650 mg Oral BID Primus Bravo., MD   650 mg at 04/16/22 2113  ? sodium chloride irrigation 0.9 % 3,000 mL  3,000 mL Irrigation Continuous Primus Bravo., MD   3,000 mL at 04/16/22 2241  ? traMADol (ULTRAM) tablet 50 mg  50 mg Oral Q6H PRN Primus Bravo., MD   50 mg at 04/17/22 0434  ? ? ? ?Objective: ?Vital signs in last 24 hours: ?Temp:  [97.4 ?F (36.3 ?C)-98.1 ?F (36.7 ?C)]  97.6 ?F (36.4 ?C) (05/04 1610) ?Pulse Rate:  [68-97] 68 (05/04 0528) ?Resp:  [12-19] 18 (05/04 0528) ?BP: (114-165)/(65-96) 146/75 (05/04 0528) ?SpO2:  [98 %-100 %] 99 % (05/04 0528) ?Weight:  [55.8 kg] 55.8 kg (05/03 1618) ? ?Intake/Output from previous day: ?05/03 0701 - 05/04 0700 ?In: 2124.7 [P.O.:720; I.V.:1404.7] ?Out: 27200 [Urine:27100; Blood:100] ?Intake/Output this shift: ?No intake/output data recorded. ? ?GENERAL APPEARANCE:  Well appearing, well developed, well nourished, NAD ?HEENT:  Atraumatic, normocephalic, oropharynx clear ?ABDOMEN:  Soft, non-tender, no masses ?EXTREMITIES:  Moves all extremities well, without clubbing, cyanosis, or edema ?NEUROLOGIC:  Alert and oriented x 3, CN II-XII grossly intact ?MENTAL STATUS:  appropriate ?BACK:  Non-tender to palpation, No CVAT ?SKIN:  Warm, dry, and intact ?GU:  foley draining clear urine ? ? ?Lab Results:  ?Recent Labs  ?  04/16/22 ?1546 04/17/22 ?9604  ?WBC 11.7* 10.6*  ?HGB 10.2* 10.2*  ?HCT  33.0* 32.2*  ?PLT 198 177  ? ?BMET ?Recent Labs  ?  04/16/22 ?1546 04/17/22 ?5409  ?NA 138 140  ?K 4.4 4.5  ?CL 111 112*  ?CO2 20* 22  ?GLUCOSE 109* 100*  ?BUN 55* 44*  ?CREATININE 1.96* 2.10*  ?CALCIUM 9.1 8.6*  ? ?PT/INR ?No results for input(s): LABPROT, INR in the last 72 hours. ?ABG ?No results for input(s): PHART, HCO3 in the last 72 hours. ? ?Invalid input(s): PCO2, PO2 ? ?Studies/Results: ?US RENAL ? ?Result Date: 04/16/2022 ?CLINICAL DATA:  Bladder mass.  TURBT today. EXAM: RENAL / URINARY TRACT ULTRASOUND COMPLETE COMPARISON:  CT abdomen pelvis 01/24/2022 FINDINGS: Right Kidney: Renal measurements: 10.3 x 5.1 x 4.9 cm = volume: 134 mL. Large staghorn calculus right renal pelvis. Mild hydronephrosis. Right lower pole cyst 10 mm. Right lower pole cyst 15 mm Left Kidney: Renal measurements: 8.2 x 4.6 x 4.2 cm = volume: 83 mL. Mild left hydronephrosis. No definite calculi. Left upper pole cyst 3 x 4 cm. Small left lateral cyst 6 mm Bladder: Foley catheter present in the bladder. Bladder is empty. No mass is seen in the bladder. Other: None. IMPRESSION: Large staghorn calculus right renal pelvis with mild right hydronephrosis Small kidney with mild left hydronephrosis Foley catheter in the bladder. Electronically Signed   By: Franchot Gallo M.D.   On: 04/16/2022 17:20  ? ?DG C-Arm 1-60 Min-No Report ? ?Result Date: 04/16/2022 ?Fluoroscopy was utilized by the requesting physician.  No radiographic interpretation.  ? ?DG C-Arm 1-60 Min-No Report ? ?Result Date: 04/16/2022 ?Fluoroscopy was utilized by the requesting physician.  No radiographic interpretation.   ? ? ?Assessment & Plan: ?Bladder mass - concerning for invasive bladder cancer ?Chronic renal failure ?Right staghorn calculus ? ?POD #1 s/p TURBT for bladder mass.  Her urine is clear with CBI overnight. ?Will D/C CBI and monitor UOP this morning. ?I was unable to identify either ureteral orifice at the time of TURBT and I am concerned about the possibility of  unilateral/bilateral ureteral obstruction.  I discussed  the potential need for nephrostomy tube placement.  Her renal U/S yesterday did not show any significant obstruction amenable to percutaneous nephrostomy tube placement.  Her Cr is slightly increased this AM. ?Will consider repeat renal U/S if UOP is abnormal or for increased flank pain. ?Plan discussed with patient and her niece this AM. ? ?Michaelle Birks, MD ?04/17/2022 ? ?

## 2022-04-17 NOTE — Progress Notes (Signed)
?PROGRESS NOTE ? ? ? ?Jane Andrews  XBD:[id number] DOB: [birth year] DOA: 04/16/2022 ?PCP: Redmond School, MD  ? ?  ?Brief Narrative:  ?Jane Andrews is a 88 y.o. female with past medical history of bladder mass, history of UTIs, anxiety and depression, hypothyroidism, hypertension who presented to [facility] for cystoscopy, transurethral resection of bladder tumor by Dr. Michaelle Birks.  Postoperatively, [facility] service consulted for medical management. ?  ?She had complaints of dysuria since January 2023 with multiple urine cultures showing mixed flora.  She saw Dr. Felipa Eth as an outpatient and underwent cystoscopy which revealed a bladder mass.  At that time, patient was recommended for cystoscopy with bilateral retrograde pyelogram, transurethral resection of bladder tumor. ? ?New events last 24 hours / Subjective: ?Patient without any new complaints today.  CBI discontinued by urology this morning. ? ?Assessment & Plan: ?  ?Principal Problem: ?  Bladder mass ?Active Problems: ?  Hypertension ?  CKD (chronic kidney disease) stage 4, GFR 15-29 ml/min (HCC) ?  Hypothyroid ?  GERD (gastroesophageal reflux disease) ?  Major depression, recurrent, chronic ([place]) ? ?Bladder mass ?-Status post cystoscopy with transurethral resection of bladder tumor 5/3  ?-Pathology pending  ?-CBI discontinued ?-Per urology  ?  ?CKD stage 4 ?-Baseline Cr 1.8 ?-Continue to monitor BMP to watch for ureteral obstruction, monitor urine output ?  ?Anxiety and depression  ?-Xanax, Cymbalta  ?  ?Hypothyroidism ?-Synthroid ?  ?HTN ?-Toprol ?  ?GERD ?-Protonix ?  ?Right MCP pain ?-She was placed on prednisone for 10 days by her PCP on 4/24  ?-Can discontinue prednisone ? ? ?DVT prophylaxis:  ?SCDs Start: 04/16/22 1522 ? ?Code Status: Full code ?Family Communication: Family at bedside ? ? ?Antimicrobials:  ?Anti-infectives (From admission, onward)  ? ? Start     Dose/Rate Route Frequency Ordered Stop  ? 04/16/22  2200  cefdinir (OMNICEF) capsule 300 mg       ? 300 mg Oral Every 12 hours 04/16/22 1521    ? 04/16/22 0924  ceFAZolin (ANCEF) 2-4 GM/100ML-% IVPB       ?Note to Pharmacy: Wilmer Floor: cabinet override  ?    04/16/22 0924 04/16/22 1117  ? 04/16/22 0922  ceFAZolin (ANCEF) IVPB 2g/100 mL premix       ? 2 g ?200 mL/hr over 30 Minutes Intravenous 30 min pre-op 04/16/22 8341 04/16/22 1110  ? ?  ? ? ? ?Objective: ?Vitals:  ? 04/16/22 1837 04/16/22 2108 04/17/22 0201 04/17/22 0528  ?BP: 114/77 (!) 149/77 135/72 (!) 146/75  ?Pulse: 91 86 80 68  ?Resp: '16 19 18 18  '$ ?Temp: 97.8 ?F (36.6 ?C) 98.1 ?F (36.7 ?C) 98 ?F (36.7 ?C) 97.6 ?F (36.4 ?C)  ?TempSrc: Oral  Oral   ?SpO2: 100% 100% 99% 99%  ?Weight:      ?Height:      ? ? ?Intake/Output Summary (Last 24 hours) at 04/17/2022 1107 ?Last data filed at 04/17/2022 9622 ?Gross per 24 hour  ?Intake 2364.71 ml  ?Output 27200 ml  ?Net -24835.29 ml  ? ?Filed Weights  ? 04/16/22 1618  ?Weight: 55.8 kg  ? ? ?Examination:  ?General exam: Appears calm and comfortable  ?Psychiatry: Judgement and insight appear normal. Mood & affect appropriate.  ? ?Data Reviewed: I have personally reviewed following labs and imaging studies ? ?CBC: ?Recent Labs  ?Lab 04/16/22 ?1546 04/17/22 ?2979  ?WBC 11.7* 10.6*  ?NEUTROABS 8.2*  --   ?HGB 10.2* 10.2*  ?HCT 33.0* 32.2*  ?  MCV 93.2 93.9  ?PLT 198 177  ? ?Basic Metabolic Panel: ?Recent Labs  ?Lab 04/16/22 ?1546 04/17/22 ?2841  ?NA 138 140  ?K 4.4 4.5  ?CL 111 112*  ?CO2 20* 22  ?GLUCOSE 109* 100*  ?BUN 55* 44*  ?CREATININE 1.96* 2.10*  ?CALCIUM 9.1 8.6*  ? ?GFR: ?Estimated Creatinine Clearance: 12.4 mL/min (A) (by C-G formula based on SCr of 2.1 mg/dL (H)). ?Liver Function Tests: ?No results for input(s): AST, ALT, ALKPHOS, BILITOT, PROT, ALBUMIN in the last 168 hours. ?No results for input(s): LIPASE, AMYLASE in the last 168 hours. ?No results for input(s): AMMONIA in the last 168 hours. ?Coagulation Profile: ?No results for input(s): INR, PROTIME in the  last 168 hours. ?Cardiac Enzymes: ?No results for input(s): CKTOTAL, CKMB, CKMBINDEX, TROPONINI in the last 168 hours. ?BNP (last 3 results) ?No results for input(s): PROBNP in the last 8760 hours. ?HbA1C: ?No results for input(s): HGBA1C in the last 72 hours. ?CBG: ?No results for input(s): GLUCAP in the last 168 hours. ?Lipid Profile: ?No results for input(s): CHOL, HDL, LDLCALC, TRIG, CHOLHDL, LDLDIRECT in the last 72 hours. ?Thyroid Function Tests: ?No results for input(s): TSH, T4TOTAL, FREET4, T3FREE, THYROIDAB in the last 72 hours. ?Anemia Panel: ?No results for input(s): VITAMINB12, FOLATE, FERRITIN, TIBC, IRON, RETICCTPCT in the last 72 hours. ?Sepsis Labs: ?No results for input(s): PROCALCITON, LATICACIDVEN in the last 168 hours. ? ?Recent Results (from the past 240 hour(s))  ?Microscopic Examination     Status: Abnormal  ? Collection Time: 04/10/22  2:10 PM  ? Urine  ?Result Value Ref Range Status  ? WBC, UA >30 (A) 0 - 5 /hpf Final  ? RBC 3-10 (A) 0 - 2 /hpf Final  ? Epithelial Cells (non renal) >10 (A) 0 - 10 /hpf Final  ? Renal Epithel, UA 0-10 (A) None seen /hpf Final  ? Mucus, UA Present Not Estab. Final  ? Bacteria, UA Few None seen/Few Final  ? Yeast, UA Present None seen Final  ?  ? ? ?Radiology Studies: ?US RENAL ? ?Result Date: 04/16/2022 ?CLINICAL DATA:  Bladder mass.  TURBT today. EXAM: RENAL / URINARY TRACT ULTRASOUND COMPLETE COMPARISON:  CT abdomen pelvis 01/24/2022 FINDINGS: Right Kidney: Renal measurements: 10.3 x 5.1 x 4.9 cm = volume: 134 mL. Large staghorn calculus right renal pelvis. Mild hydronephrosis. Right lower pole cyst 10 mm. Right lower pole cyst 15 mm Left Kidney: Renal measurements: 8.2 x 4.6 x 4.2 cm = volume: 83 mL. Mild left hydronephrosis. No definite calculi. Left upper pole cyst 3 x 4 cm. Small left lateral cyst 6 mm Bladder: Foley catheter present in the bladder. Bladder is empty. No mass is seen in the bladder. Other: None. IMPRESSION: Large staghorn calculus right  renal pelvis with mild right hydronephrosis Small kidney with mild left hydronephrosis Foley catheter in the bladder. Electronically Signed   By: Franchot Gallo M.D.   On: 04/16/2022 17:20  ? ?DG C-Arm 1-60 Min-No Report ? ?Result Date: 04/16/2022 ?Fluoroscopy was utilized by the requesting physician.  No radiographic interpretation.  ? ?DG C-Arm 1-60 Min-No Report ? ?Result Date: 04/16/2022 ?Fluoroscopy was utilized by the requesting physician.  No radiographic interpretation.   ? ? ? ?Scheduled Meds: ? cefdinir  300 mg Oral Q12H  ? Chlorhexidine Gluconate Cloth  6 each Topical Daily  ? DULoxetine  30 mg Oral Daily  ? DULoxetine  60 mg Oral Daily  ? levothyroxine  75 mcg Oral Q0600  ? metoprolol succinate  25 mg  Oral Daily  ? pantoprazole  40 mg Oral Daily  ? sodium bicarbonate  650 mg Oral BID  ? ?Continuous Infusions: ? dextrose 5 % and 0.45% NaCl 75 mL/hr at 04/17/22 0437  ? sodium chloride irrigation    ? ? ? LOS: 0 days  ? ? ? ?Dessa Phi, DO ?[facility] ?04/17/2022, 11:07 AM  ? ?Available via Epic secure chat 7am-7pm ?After these hours, please refer to coverage provider listed on [email or web address] ? ?

## 2022-04-17 NOTE — Progress Notes (Signed)
Patient vitals have been stable.  Patient has c/o pain and bladder spasms and has been medicated appropriately.  Bladder irrigation has flowed well, urine has been light pink to clear this shift.  ?

## 2022-04-18 ENCOUNTER — Encounter (HOSPITAL_COMMUNITY): Payer: Self-pay | Admitting: Urology

## 2022-04-18 DIAGNOSIS — I96 Gangrene, not elsewhere classified: Secondary | ICD-10-CM | POA: Diagnosis present

## 2022-04-18 DIAGNOSIS — C679 Malignant neoplasm of bladder, unspecified: Secondary | ICD-10-CM | POA: Diagnosis present

## 2022-04-18 DIAGNOSIS — Z87440 Personal history of urinary (tract) infections: Secondary | ICD-10-CM | POA: Diagnosis not present

## 2022-04-18 DIAGNOSIS — Z853 Personal history of malignant neoplasm of breast: Secondary | ICD-10-CM | POA: Diagnosis not present

## 2022-04-18 DIAGNOSIS — Z87891 Personal history of nicotine dependence: Secondary | ICD-10-CM | POA: Diagnosis not present

## 2022-04-18 DIAGNOSIS — N132 Hydronephrosis with renal and ureteral calculous obstruction: Secondary | ICD-10-CM | POA: Diagnosis present

## 2022-04-18 DIAGNOSIS — Z96641 Presence of right artificial hip joint: Secondary | ICD-10-CM | POA: Diagnosis present

## 2022-04-18 DIAGNOSIS — Z8261 Family history of arthritis: Secondary | ICD-10-CM | POA: Diagnosis not present

## 2022-04-18 DIAGNOSIS — K219 Gastro-esophageal reflux disease without esophagitis: Secondary | ICD-10-CM | POA: Diagnosis present

## 2022-04-18 DIAGNOSIS — Z79899 Other long term (current) drug therapy: Secondary | ICD-10-CM | POA: Diagnosis not present

## 2022-04-18 DIAGNOSIS — Z885 Allergy status to narcotic agent status: Secondary | ICD-10-CM | POA: Diagnosis not present

## 2022-04-18 DIAGNOSIS — N184 Chronic kidney disease, stage 4 (severe): Secondary | ICD-10-CM | POA: Diagnosis present

## 2022-04-18 DIAGNOSIS — Z79890 Hormone replacement therapy (postmenopausal): Secondary | ICD-10-CM | POA: Diagnosis not present

## 2022-04-18 DIAGNOSIS — F338 Other recurrent depressive disorders: Secondary | ICD-10-CM | POA: Diagnosis present

## 2022-04-18 DIAGNOSIS — I129 Hypertensive chronic kidney disease with stage 1 through stage 4 chronic kidney disease, or unspecified chronic kidney disease: Secondary | ICD-10-CM | POA: Diagnosis present

## 2022-04-18 DIAGNOSIS — Z9049 Acquired absence of other specified parts of digestive tract: Secondary | ICD-10-CM | POA: Diagnosis not present

## 2022-04-18 DIAGNOSIS — F419 Anxiety disorder, unspecified: Secondary | ICD-10-CM | POA: Diagnosis present

## 2022-04-18 DIAGNOSIS — Z9011 Acquired absence of right breast and nipple: Secondary | ICD-10-CM | POA: Diagnosis not present

## 2022-04-18 DIAGNOSIS — K5790 Diverticulosis of intestine, part unspecified, without perforation or abscess without bleeding: Secondary | ICD-10-CM | POA: Diagnosis present

## 2022-04-18 DIAGNOSIS — D494 Neoplasm of unspecified behavior of bladder: Secondary | ICD-10-CM | POA: Diagnosis present

## 2022-04-18 DIAGNOSIS — N3289 Other specified disorders of bladder: Secondary | ICD-10-CM | POA: Diagnosis not present

## 2022-04-18 DIAGNOSIS — D649 Anemia, unspecified: Secondary | ICD-10-CM | POA: Diagnosis present

## 2022-04-18 DIAGNOSIS — E039 Hypothyroidism, unspecified: Secondary | ICD-10-CM | POA: Diagnosis present

## 2022-04-18 DIAGNOSIS — Z825 Family history of asthma and other chronic lower respiratory diseases: Secondary | ICD-10-CM | POA: Diagnosis not present

## 2022-04-18 LAB — BASIC METABOLIC PANEL
Anion gap: 5 (ref 5–15)
BUN: 38 mg/dL — ABNORMAL HIGH (ref 8–23)
CO2: 20 mmol/L — ABNORMAL LOW (ref 22–32)
Calcium: 8.2 mg/dL — ABNORMAL LOW (ref 8.9–10.3)
Chloride: 112 mmol/L — ABNORMAL HIGH (ref 98–111)
Creatinine, Ser: 2.1 mg/dL — ABNORMAL HIGH (ref 0.44–1.00)
GFR, Estimated: 21 mL/min — ABNORMAL LOW (ref >60)
Glucose, Bld: 101 mg/dL — ABNORMAL HIGH (ref 70–99)
Potassium: 3.9 mmol/L (ref 3.5–5.1)
Sodium: 137 mmol/L (ref 135–145)

## 2022-04-18 LAB — SURGICAL PATHOLOGY

## 2022-04-18 MED ORDER — MENTHOL 3 MG MT LOZG
1.0000 | LOZENGE | OROMUCOSAL | Status: DC | PRN
  Administered 2022-04-18: 3 mg via ORAL
  Filled 2022-04-18: qty 9

## 2022-04-18 MED ORDER — TRAMADOL HCL 50 MG PO TABS: 50.0000 mg | ORAL_TABLET | Freq: Four times a day (QID) | ORAL | 0 refills | Status: DC | PRN

## 2022-04-18 MED ORDER — MIRABEGRON ER 25 MG PO TB24
25.0000 mg | ORAL_TABLET | Freq: Every day | ORAL | Status: DC
Start: 2022-04-18 — End: 2022-04-18
  Administered 2022-04-18: 25 mg via ORAL
  Filled 2022-04-18: qty 1

## 2022-04-18 NOTE — Evaluation (Signed)
Physical Therapy Evaluation ?Patient Details ?Name: Jane Andrews ?MRN: [id number] ?DOB: July 17, 1925 ?Today's Date: 04/18/2022 ? ?History of Present Illness ? ALAYZHA AN is a 88 y.o. female with past medical history of bladder mass, history of UTIs, anxiety and depression, hypothyroidism, hypertension who presented to [facility] for cystoscopy, transurethral resection of bladder tumor by Dr. Michaelle Birks.  Postoperatively, [facility] service consulted for medical management. ?  ?Clinical Impression ? Patient seated in chair at beginning of session. Patient limited for functional mobility as stated below secondary to BLE weakness and slightly impaired standing balance. She is able to transfer to standing without AD and ambulate with HHA with slight unsteadiness but no loss of balance. She is then agreeable to ambulate further with use of RW with improves balance for a total of 300 feet with slight fatigue following. Educated patient's family on mobilizing patient frequently while in hospital to maintain and improve strength and balance.  Patient will benefit from continued physical therapy in hospital and recommended venue below to increase strength, balance, endurance for safe ADLs and gait. ?   ?   ? ?Recommendations for follow up therapy are one component of a multi-disciplinary discharge planning process, led by the attending physician.  Recommendations may be updated based on patient status, additional functional criteria and insurance authorization. ? ?Follow Up Recommendations Home health PT ? ?  ?Assistance Recommended at Discharge Intermittent Supervision/Assistance  ?Patient can return home with the following ? A little help with bathing/dressing/bathroom;Assistance with cooking/housework;Assist for transportation;Help with stairs or ramp for entrance ? ?  ?Equipment Recommendations None recommended by PT  ?Recommendations for Other Services ?    ?  ?Functional Status  Assessment Patient has had a recent decline in their functional status and demonstrates the ability to make significant improvements in function in a reasonable and predictable amount of time.  ? ?  ?Precautions / Restrictions Precautions ?Precautions: Fall ?Restrictions ?Weight Bearing Restrictions: No  ? ?  ? ?Mobility ? Bed Mobility ?  ?  ?  ?  ?  ?  ?  ?General bed mobility comments: seated in chair at beginning of session ?  ? ?Transfers ?Overall transfer level: Needs assistance ?Equipment used: None, Rolling walker (2 wheels) ?Transfers: Sit to/from Stand ?Sit to Stand: Min guard ?  ?  ?  ?  ?  ?General transfer comment: slighlty unsteady without AD without loss of balance ?  ? ?Ambulation/Gait ?Ambulation/Gait assistance: Min guard ?Gait Distance (Feet): 300 Feet ?Assistive device: 1 person hand held assist, Rolling walker (2 wheels) ?Gait Pattern/deviations: Step-through pattern ?Gait velocity: deceased ?  ?  ?General Gait Details: able to ambulate120 feet without AD with HHA - slight unsteadiness;  ambulates 153fet  with RW - improved stability ? ?Stairs ?  ?  ?  ?  ?  ? ?Wheelchair Mobility ?  ? ?Modified Rankin (Stroke Patients Only) ?  ? ?  ? ?Balance Overall balance assessment: Needs assistance ?Sitting-balance support: No upper extremity supported ?Sitting balance-Leahy Scale: Good ?  ?  ?Standing balance support: No upper extremity supported ?Standing balance-Leahy Scale: Fair ?Standing balance comment: good with RW ?  ?  ?  ?  ?  ?  ?  ?  ?  ?  ?  ?   ? ? ? ?Pertinent Vitals/Pain Pain Assessment ?Pain Assessment: No/denies pain  ? ? ?Home Living Family/patient expects to be discharged to:: Private residence ?Living Arrangements: Alone ?Available Help at Discharge: Family ?Type of Home: House ?  Home Access: Stairs to enter ?Entrance Stairs-Rails: Right;Left ?Entrance Stairs-Number of Steps: 3 ?Alternate Level Stairs-Number of Steps: 20 ?Home Layout: Two level ?Home Equipment: Conservation officer, nature (2  wheels);Cane - single point;Shower seat;Grab bars - tub/shower ?   ?  ?Prior Function Prior Level of Function : Independent/Modified Independent;Driving ?  ?  ?  ?  ?  ?  ?Mobility Comments: household ambulation without AD ?ADLs Comments: independent ?  ? ? ?Hand Dominance  ?   ? ?  ?Extremity/Trunk Assessment  ? Upper Extremity Assessment ?Upper Extremity Assessment: Defer to OT evaluation ?  ? ?Lower Extremity Assessment ?Lower Extremity Assessment: Overall WFL for tasks assessed;Generalized weakness ?  ? ?   ?Communication  ? Communication: No difficulties  ?Cognition Arousal/Alertness: Awake/alert ?Behavior During Therapy: [facility] for tasks assessed/performed ?Overall Cognitive Status: Within Functional Limits for tasks assessed ?  ?  ?  ?  ?  ?  ?  ?  ?  ?  ?  ?  ?  ?  ?  ?  ?  ?  ?  ? ?  ?General Comments   ? ?  ?Exercises    ? ?Assessment/Plan  ?  ?PT Assessment Patient needs continued PT services  ?PT Problem List Decreased strength;Decreased mobility;Decreased activity tolerance;Decreased balance;Decreased knowledge of use of DME ? ?   ?  ?PT Treatment Interventions DME instruction;Therapeutic exercise;Gait training;Balance training;Neuromuscular re-education;Stair training;Functional mobility training;Therapeutic activities;Patient/family education   ? ?PT Goals (Current goals can be found in the Care Plan section)  ?Acute Rehab PT Goals ?Patient Stated Goal: Return home ?PT Goal Formulation: With patient ?Time For Goal Achievement: 04/25/22 ?Potential to Achieve Goals: Good ? ?  ?Frequency Min 3X/week ?  ? ? ?Co-evaluation   ?  ?  ?  ?  ? ? ?  ?AM-PAC PT "6 Clicks" Mobility  ?Outcome Measure Help needed turning from your back to your side while in a flat bed without using bedrails?: None ?Help needed moving from lying on your back to sitting on the side of a flat bed without using bedrails?: A Little ?Help needed moving to and from a bed to a chair (including a wheelchair)?: A Little ?Help needed standing up  from a chair using your arms (e.g., wheelchair or bedside chair)?: A Little ?Help needed to walk in hospital room?: A Little ?Help needed climbing 3-5 steps with a railing? : A Little ?6 Click Score: 19 ? ?  ?End of Session Equipment Utilized During Treatment: Gait belt ?Activity Tolerance: Patient tolerated treatment well ?Patient left: in chair;with family/visitor present;with call bell/phone within reach ?Nurse Communication: Mobility status ?PT Visit Diagnosis: Unsteadiness on feet (R26.81);Other abnormalities of gait and mobility (R26.89);Muscle weakness (generalized) (M62.81) ?  ? ?Time: 3009-2330 ?PT Time Calculation (min) (ACUTE ONLY): 20 min ? ? ?Charges:   PT Evaluation ?$PT Eval Low Complexity: 1 Low ?PT Treatments ?$Therapeutic Activity: 8-22 mins ?  ?   ? ?1:11 PM, 04/18/22 ?Mearl Latin PT, DPT ?Physical Therapist at [facility] ?[facility] ? ? ?

## 2022-04-18 NOTE — Progress Notes (Signed)
Pt requests to be made a DNR. Pt educated with niece in the room. MD aware.  ?

## 2022-04-18 NOTE — Progress Notes (Signed)
?PROGRESS NOTE ? ? ? ?Jane Andrews  JOI:[id number] DOB: 1925/02/17 DOA: 04/16/2022 ?PCP: Redmond School, MD  ? ?  ?Brief Narrative:  ?Jane Andrews is a 88 y.o. female with past medical history of bladder mass, history of UTIs, anxiety and depression, hypothyroidism, hypertension who presented to [facility] for cystoscopy, transurethral resection of bladder tumor by Dr. Michaelle Birks.  Postoperatively, [facility] consulted for medical management. ?  ?She had complaints of dysuria since January 2023 with multiple urine cultures showing mixed flora.  She saw Dr. Felipa Eth as an outpatient and underwent cystoscopy which revealed a bladder mass.  At that time, patient was recommended for cystoscopy with bilateral retrograde pyelogram, transurethral resection of bladder tumor. ? ?New events last 24 hours / Subjective: ?Patient continues to have complaints of bladder spasms.  CBI was resumed yesterday afternoon, held this morning, and patient continues to have hematuria.  Family at bedside concerned that patient has not been out of bed much.  Discussed PT evaluation today ? ?Assessment & Plan: ?  ?Principal Problem: ?  Bladder mass ?Active Problems: ?  Hypertension ?  CKD (chronic kidney disease) stage 4, GFR 15-29 ml/min (HCC) ?  Hypothyroid ?  GERD (gastroesophageal reflux disease) ?  Major depression, recurrent, chronic ([place]) ? ?Bladder mass ?-Status post cystoscopy with transurethral resection of bladder tumor 5/3  ?-Pathology pending  ?-CBI on hold ?-Per urology  ?  ?CKD stage 4 ?-Baseline Cr 1.8 ?-Continue to monitor BMP to watch for ureteral obstruction, monitor urine output ?-Remains stable ?  ?Anxiety and depression  ?-Xanax, Cymbalta  ?  ?Hypothyroidism ?-Synthroid ?  ?HTN ?-Toprol ?  ?GERD ?-Protonix ?  ?Right MCP pain ?-She was placed on prednisone for 10 days by her PCP on 4/24  ?-Can discontinue prednisone ? ? ?DVT prophylaxis:  ?SCDs Start: 04/16/22 1522 ? ?Code  Status: Full code ?Family Communication: Family at bedside ? ? ?Antimicrobials:  ?Anti-infectives (From admission, onward)  ? ? Start     Dose/Rate Route Frequency Ordered Stop  ? 04/16/22 2200  cefdinir (OMNICEF) capsule 300 mg       ? 300 mg Oral Every 12 hours 04/16/22 1521    ? 04/16/22 0924  ceFAZolin (ANCEF) 2-4 GM/100ML-% IVPB       ?Note to Pharmacy: [place] Floor: cabinet override  ?    04/16/22 0924 04/16/22 1117  ? 04/16/22 0922  ceFAZolin (ANCEF) IVPB 2g/100 mL premix       ? 2 g ?200 mL/hr over 30 Minutes Intravenous 30 min pre-op 04/16/22 4709 04/16/22 1110  ? ?  ? ? ? ?Objective: ?Vitals:  ? 04/17/22 1337 04/17/22 2232 04/18/22 0456 04/18/22 0813  ?BP: 110/65 128/61 94/64 114/69  ?Pulse: 67 66 70 75  ?Resp: '20 18 18 18  '$ ?Temp: 98.1 ?F (36.7 ?C) 97.6 ?F (36.4 ?C) 97.8 ?F (36.6 ?C)   ?TempSrc: Oral     ?SpO2: 98% 100% 98% 98%  ?Weight:      ?Height:      ? ? ?Intake/Output Summary (Last 24 hours) at 04/18/2022 0957 ?Last data filed at 04/18/2022 0820 ?Gross per 24 hour  ?Intake 960 ml  ?Output 21300 ml  ?Net -20340 ml  ? ? ?Filed Weights  ? 04/16/22 1618  ?Weight: 55.8 kg  ? ? ?Examination: ?General exam: Appears calm and comfortable  ?Respiratory system: Clear to auscultation. Respiratory effort normal. ?Cardiovascular system: S1 & S2 heard, RRR. No pedal edema. ?Gastrointestinal system: Abdomen is nondistended, soft and  nontender. Normal bowel sounds heard. ?Central nervous system: Alert and oriented. Non focal exam. Speech clear  ?Extremities: Symmetric in appearance bilaterally  ?Skin: No rashes, lesions or ulcers on exposed skin  ?Psychiatry: Judgement and insight appear stable. Mood & affect appropriate.  ? ? ?Data Reviewed: I have personally reviewed following labs and imaging studies ? ?CBC: ?Recent Labs  ?Lab 04/16/22 ?1546 04/17/22 ?8938  ?WBC 11.7* 10.6*  ?NEUTROABS 8.2*  --   ?HGB 10.2* 10.2*  ?HCT 33.0* 32.2*  ?MCV 93.2 93.9  ?PLT 198 177  ? ? ?Basic Metabolic Panel: ?Recent Labs  ?Lab  04/16/22 ?1546 04/17/22 ?1017 04/18/22 ?0512  ?NA 138 140 137  ?K 4.4 4.5 3.9  ?CL 111 112* 112*  ?CO2 20* 22 20*  ?GLUCOSE 109* 100* 101*  ?BUN 55* 44* 38*  ?CREATININE 1.96* 2.10* 2.10*  ?CALCIUM 9.1 8.6* 8.2*  ? ? ?GFR: ?Estimated Creatinine Clearance: 12.4 mL/min (A) (by C-G formula based on SCr of 2.1 mg/dL (H)). ?Liver Function Tests: ?No results for input(s): AST, ALT, ALKPHOS, BILITOT, PROT, ALBUMIN in the last 168 hours. ?No results for input(s): LIPASE, AMYLASE in the last 168 hours. ?No results for input(s): AMMONIA in the last 168 hours. ?Coagulation Profile: ?No results for input(s): INR, PROTIME in the last 168 hours. ?Cardiac Enzymes: ?No results for input(s): CKTOTAL, CKMB, CKMBINDEX, TROPONINI in the last 168 hours. ?BNP (last 3 results) ?No results for input(s): PROBNP in the last 8760 hours. ?HbA1C: ?No results for input(s): HGBA1C in the last 72 hours. ?CBG: ?No results for input(s): GLUCAP in the last 168 hours. ?Lipid Profile: ?No results for input(s): CHOL, HDL, LDLCALC, TRIG, CHOLHDL, LDLDIRECT in the last 72 hours. ?Thyroid Function Tests: ?No results for input(s): TSH, T4TOTAL, FREET4, T3FREE, THYROIDAB in the last 72 hours. ?Anemia Panel: ?No results for input(s): VITAMINB12, FOLATE, FERRITIN, TIBC, IRON, RETICCTPCT in the last 72 hours. ?Sepsis Labs: ?No results for input(s): PROCALCITON, LATICACIDVEN in the last 168 hours. ? ?Recent Results (from the past 240 hour(s))  ?Microscopic Examination     Status: Abnormal  ? Collection Time: 04/10/22  2:10 PM  ? Urine  ?Result Value Ref Range Status  ? WBC, UA >30 (A) 0 - 5 /hpf Final  ? RBC 3-10 (A) 0 - 2 /hpf Final  ? Epithelial Cells (non renal) >10 (A) 0 - 10 /hpf Final  ? Renal Epithel, UA 0-10 (A) None seen /hpf Final  ? Mucus, UA Present Not Estab. Final  ? Bacteria, UA Few None seen/Few Final  ? Yeast, UA Present None seen Final  ? ?  ? ? ?Radiology Studies: ?US RENAL ? ?Result Date: 04/16/2022 ?CLINICAL DATA:  Bladder mass.  TURBT today.  EXAM: RENAL / URINARY TRACT ULTRASOUND COMPLETE COMPARISON:  CT abdomen pelvis 01/24/2022 FINDINGS: Right Kidney: Renal measurements: 10.3 x 5.1 x 4.9 cm = volume: 134 mL. Large staghorn calculus right renal pelvis. Mild hydronephrosis. Right lower pole cyst 10 mm. Right lower pole cyst 15 mm Left Kidney: Renal measurements: 8.2 x 4.6 x 4.2 cm = volume: 83 mL. Mild left hydronephrosis. No definite calculi. Left upper pole cyst 3 x 4 cm. Small left lateral cyst 6 mm Bladder: Foley catheter present in the bladder. Bladder is empty. No mass is seen in the bladder. Other: None. IMPRESSION: Large staghorn calculus right renal pelvis with mild right hydronephrosis Small kidney with mild left hydronephrosis Foley catheter in the bladder. Electronically Signed   By: Franchot Gallo M.D.   On: 04/16/2022 17:20  ? ?  DG C-Arm 1-60 Min-No Report ? ?Result Date: 04/16/2022 ?Fluoroscopy was utilized by the requesting physician.  No radiographic interpretation.  ? ?DG C-Arm 1-60 Min-No Report ? ?Result Date: 04/16/2022 ?Fluoroscopy was utilized by the requesting physician.  No radiographic interpretation.   ? ? ? ?Scheduled Meds: ? cefdinir  300 mg Oral Q12H  ? Chlorhexidine Gluconate Cloth  6 each Topical Daily  ? DULoxetine  30 mg Oral Daily  ? DULoxetine  60 mg Oral Daily  ? levothyroxine  75 mcg Oral Q0600  ? metoprolol succinate  25 mg Oral Daily  ? mirabegron ER  25 mg Oral Daily  ? pantoprazole  40 mg Oral Daily  ? sodium bicarbonate  650 mg Oral BID  ? ?Continuous Infusions: ? sodium chloride irrigation    ? ? ? LOS: 0 days  ? ? ? ?Dessa Phi, DO ?[facility] ?04/18/2022, 9:57 AM  ? ?Available via Epic secure chat 7am-7pm ?After these hours, please refer to coverage provider listed on [email or web address] ? ?

## 2022-04-18 NOTE — Progress Notes (Signed)
Foley catheter removed per verbal order. MD notified prior to removal 1 hr post CBI urine was a light pink color. Pt tolerated removal with 20 mL deflated from balloon. Pt expresses 10/10 bladder pain post PRN medication. Pt ambulated with assistance in room, and continues to sit up in recliner chair at this time.  ?

## 2022-04-18 NOTE — Plan of Care (Signed)
?  Problem: Acute Rehab PT Goals(only PT should resolve) ?Goal: Patient Will Transfer Sit To/From Stand ?Outcome: Progressing ?Flowsheets (Taken 04/18/2022 1312) ?Patient will transfer sit to/from stand: with supervision ?Goal: Pt Will Transfer Bed To Chair/Chair To Bed ?Outcome: Progressing ?Flowsheets (Taken 04/18/2022 1312) ?Pt will Transfer Bed to Chair/Chair to Bed: with supervision ?Goal: Pt Will Ambulate ?Outcome: Progressing ?Flowsheets (Taken 04/18/2022 1312) ?Pt will Ambulate: ? > 125 feet ? with modified independence ? with least restrictive assistive device ?Goal: Pt/caregiver will Perform Home Exercise Program ?Outcome: Progressing ?Flowsheets (Taken 04/18/2022 1312) ?Pt/caregiver will Perform Home Exercise Program: ? For increased strengthening ? For improved balance ? Independently ? 1:12 PM, 04/18/22 ?Mearl Latin PT, DPT ?Physical Therapist at [facility] ?[facility] ? ?

## 2022-04-21 ENCOUNTER — Ambulatory Visit: Payer: Medicare Other | Admitting: Orthopedic Surgery

## 2022-04-21 ENCOUNTER — Other Ambulatory Visit: Payer: Self-pay | Admitting: Urology

## 2022-04-23 ENCOUNTER — Ambulatory Visit (INDEPENDENT_AMBULATORY_CARE_PROVIDER_SITE_OTHER): Payer: Medicare Other | Admitting: Urology

## 2022-04-23 ENCOUNTER — Encounter: Payer: Self-pay | Admitting: Urology

## 2022-04-23 VITALS — BP 137/80 | HR 81

## 2022-04-23 DIAGNOSIS — R309 Painful micturition, unspecified: Secondary | ICD-10-CM

## 2022-04-23 DIAGNOSIS — C670 Malignant neoplasm of trigone of bladder: Secondary | ICD-10-CM | POA: Diagnosis not present

## 2022-04-23 MED ORDER — MIRABEGRON ER 25 MG PO TB24: 25.0000 mg | ORAL_TABLET | Freq: Every day | ORAL | 0 refills | Status: DC

## 2022-04-23 MED ORDER — URIBEL 118 MG PO CAPS: 1.0000 | ORAL_CAPSULE | Freq: Four times a day (QID) | ORAL | 5 refills | Status: DC | PRN

## 2022-04-23 NOTE — Progress Notes (Signed)
She  ? ?Assessment: ?1. 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 Pain with urination   ? ? ?Plan: ?I had a lengthy discussion with the patient and her niece regarding the pathology results.  I discussed the findings of invasive high-grade bladder cancer with squamous component.  I recommended further evaluation with imaging studies to evaluate for possible metastatic disease.  I briefly discussed options for management.  Given her advanced age, she is not a candidate for cystectomy and urinary diversion.  Palliative radiation therapy may be an option. ?CMP today ?PET CT ordered ?Rx for Uribel provided ?Samples of Myrbetriq 25 mg daily provided.   ?Return to office in 2 weeks ? ? ?Today, I personally spent 30 minutes in direct face to face time with the patient, of which greater than 50% of the time was spent in patient education and counseling as described above. ? ?Chief Complaint: ?Chief Complaint  ?Patient presents with  ? Bladder Cancer  ? ? ?HPI: ?Jane Andrews is a 88 y.o. female who presents for continued evaluation of dysuria.  She was initially seen in January 2023 for dysuria.  Her symptoms have been present for approximately 2 months.   ?Urine culture from 12/21/2021 grew 50-100 K mixed flora.   ?MDX culture from 01/07/2022 showed no organisms. ?Urine culture from 03/29/2022 grew 10-20 5K mixed flora. ?No gross hematuria.  She does have urgency, frequency. ?She continued to have intermittent dysuria, frequency, and incontinence.  No gross hematuria or flank pain. ?CT renal stone study from 01/26/2022 showed a stable staghorn calculus in the right kidney, tiny left lower pole renal calculi, and a cystic area in the right pelvis/adnexa. ?Cystoscopy from 4/23 demonstrated a sessile appearing bladder mass in the trigone area. ?Urine cytology showed dysplastic cells and was FISH positive. ?She underwent  cystoscopy with transurethral resection of the bladder tumor on 04/16/2022.  The UOs were unable to be identified during the procedure.  Pathology showed high-grade urothelial carcinoma with squamous cell component (95%) with invasion of the muscularis propria.  She was discharged from the hospital on postoperative day #2.  Her creatinine remained fairly stable at 2.1 at the time of discharge. ? ?She returns today for follow-up.  She is not having any gross hematuria at the present time.  She is voiding frequently.  She continues to have dysuria and bladder spasms.  No flank pain or abdominal pain.  No nausea or vomiting.  She has completed her antibiotics. ? ?Portions of the above documentation were copied from a prior visit for review purposes only. ? ?Allergies: ?Allergies  ?Allergen Reactions  ? Codeine Nausea And Vomiting  ? ? ?PMH: ?Past Medical History:  ?Diagnosis Date  ? Abdominal adhesions 1994  ? Allergic rhinitis   ? Anemia   ? Anxiety and depression   ? Aortic stenosis   ? Arthritis   ? Atrial fibrillation ([place]) 10/2012  ? Associated with severe anemia and esophageal pill impaction  ? Breast carcinoma ([place])   ? Right mastectomy  ? Cholelithiasis   ? Essential hypertension   ? Gastroesophageal reflux disease   ? Hiatal hernia  ? History of blood transfusion   ? Hypothyroidism   ? Low back pain   ? Malabsorption   ? 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 Nephrolithiasis 2004  ? Painless hematuria  ? Short gut syndrome   ? Bowel resection ,  2004  ? Upper GI bleed 2004  ? Multiple episodes of melena-? due to gastritis or adverse drug effect (nonsteroidals, small bowel ulceration with Fosamax); caused by Pepto-Bismol during one Emergency Department evaluation  ? ? ?PSH: ?Past Surgical History:  ?Procedure Laterality Date  ? ABDOMINAL HYSTERECTOMY    ? emergency s/p delivery  ? ABDOMINAL HYSTERECTOMY  1960  ? massive gynecologic bleeding  ? BOWEL RESECTION    ? Resulting short gut  syndrome  ? CHOLECYSTECTOMY N/A 06/25/2018  ? Procedure: LAPAROSCOPIC CHOLECYSTECTOMY WITH INTRAOPERATIVE CHOLANGIOGRAM;  Surgeon: Armandina Gemma, MD;  Location: [facility];  Service: General;  Laterality: N/A;  ? COLONOSCOPY W/ POLYPECTOMY  2005  ? Lipoma; diverticulosis  ? COLONOSCOPY WITH ESOPHAGOGASTRODUODENOSCOPY (EGD)  11/22/2012  ? [place]  ? CYSTOSCOPY W/ RETROGRADES Bilateral 04/16/2022  ? Procedure: CYSTOSCOPY;  Surgeon: Primus Bravo., MD;  Location: [facility];  Service: Urology;  Laterality: Bilateral;  ? ESOPHAGOGASTRODUODENOSCOPY (EGD) WITH PROPOFOL N/A 04/04/2022  ? Procedure: ESOPHAGOGASTRODUODENOSCOPY (EGD) WITH PROPOFOL;  Surgeon: Rogene Houston, MD;  Location: [facility];  Service: Endoscopy;  Laterality: N/A;  210  ? HIP ARTHROPLASTY Right 06/15/2020  ? Procedure: ANTERIOR ARTHROPLASTY BIPOLAR HIP (HEMIARTHROPLASTY);  Surgeon: Mcarthur Rossetti, MD;  Location: [facility];  Service: Orthopedics;  Laterality: Right;  ? [place]  ? s/p adhesions  ? MASTECTOMY  right breast  ? MASTECTOMY    ? Carcinoma of the breast; right  ? TRANSURETHRAL RESECTION OF BLADDER TUMOR N/A 04/16/2022  ? Procedure: TRANSURETHRAL RESECTION OF BLADDER TUMOR (TURBT);  Surgeon: Primus Bravo., MD;  Location: [facility];  Service: Urology;  Laterality: N/A;  ? UPPER GASTROINTESTINAL ENDOSCOPY    ? ? ?SH: ?Social History  ? ?Tobacco Use  ? Smoking status: Former  ?  Packs/day: 1.50  ?  Years: 20.00  ?  Pack years: 30.00  ?  Types: Cigarettes  ? Smokeless tobacco: Never  ?Vaping Use  ? Vaping Use: Never used  ?Substance Use Topics  ? Alcohol use: No  ? Drug use: No  ? ? ?ROS: ?Constitutional:  Negative for fever, chills, weight loss ?CV: Negative for chest pain, previous MI, hypertension ?Respiratory:  Negative for shortness of breath, wheezing, sleep apnea, frequent cough ?GI:  Negative for nausea, vomiting, bloody stool, GERD ? ?PE: ?BP 137/80   Pulse 81  ?GENERAL APPEARANCE:  Well appearing, well  developed, well nourished, NAD ?HEENT:  Atraumatic, normocephalic, oropharynx clear ?NECK:  Supple without lymphadenopathy or thyromegaly ?ABDOMEN:  Soft, non-tender, no masses ?EXTREMITIES:  Moves all extremities well, without clubbing, cyanosis, or edema ?NEUROLOGIC:  Alert and oriented x 3, normal gait, CN II-XII grossly intact ?MENTAL STATUS:  appropriate ?BACK:  Non-tender to palpation, No CVAT ?SKIN:  Warm, dry, and intact ? ? ?Results: ?None ? ? ?

## 2022-04-24 LAB — COMPREHENSIVE METABOLIC PANEL
ALT: 8 IU/L (ref 0–32)
AST: 17 IU/L (ref 0–40)
Albumin/Globulin Ratio: 1.8 (ref 1.2–2.2)
Albumin: 4.3 g/dL (ref 3.5–4.6)
Alkaline Phosphatase: 83 IU/L (ref 44–121)
BUN/Creatinine Ratio: 20 (ref 12–28)
BUN: 37 mg/dL — ABNORMAL HIGH (ref 10–36)
Bilirubin Total: 0.2 mg/dL (ref 0.0–1.2)
CO2: 18 mmol/L — ABNORMAL LOW (ref 20–29)
Calcium: 9.4 mg/dL (ref 8.7–10.3)
Chloride: 107 mmol/L — ABNORMAL HIGH (ref 96–106)
Creatinine, Ser: 1.87 mg/dL — ABNORMAL HIGH (ref 0.57–1.00)
Globulin, Total: 2.4 g/dL (ref 1.5–4.5)
Glucose: 108 mg/dL — ABNORMAL HIGH (ref 70–99)
Potassium: 4.9 mmol/L (ref 3.5–5.2)
Sodium: 141 mmol/L (ref 134–144)
Total Protein: 6.7 g/dL (ref 6.0–8.5)
eGFR: 25 mL/min/{1.73_m2} — ABNORMAL LOW (ref >59)

## 2022-05-01 ENCOUNTER — Ambulatory Visit (HOSPITAL_COMMUNITY)
Admission: RE | Admit: 2022-05-01 | Discharge: 2022-05-01 | Disposition: A | Discharge status: Home / Self Care | Payer: Medicare Other | Source: Ambulatory Visit | Attending: Urology | Admitting: Urology

## 2022-05-01 DIAGNOSIS — I700 Atherosclerosis of aorta: Secondary | ICD-10-CM | POA: Diagnosis not present

## 2022-05-01 DIAGNOSIS — C670 Malignant neoplasm of trigone of bladder: Secondary | ICD-10-CM

## 2022-05-01 DIAGNOSIS — C679 Malignant neoplasm of bladder, unspecified: Secondary | ICD-10-CM | POA: Diagnosis not present

## 2022-05-01 DIAGNOSIS — N200 Calculus of kidney: Secondary | ICD-10-CM | POA: Diagnosis not present

## 2022-05-01 DIAGNOSIS — Z9011 Acquired absence of right breast and nipple: Secondary | ICD-10-CM | POA: Diagnosis not present

## 2022-05-01 MED ORDER — FLUDEOXYGLUCOSE F - 18 (FDG) INJECTION
7.3000 | Freq: Once | INTRAVENOUS | Status: AC | PRN
  Administered 2022-05-01: 6.46 via INTRAVENOUS

## 2022-05-02 ENCOUNTER — Ambulatory Visit: Payer: Medicare Other | Admitting: Urology

## 2022-05-07 NOTE — Discharge Summary (Signed)
Physician Discharge Summary  Patient ID: [name] MRN: [id number] DOB/AGE: 1925/10/08 88 y.o.  Admit date: 04/16/2022 Discharge date: 04/17/2022  Admission Diagnoses:  Bladder mass  Discharge Diagnoses:  Principal Problem:   Bladder mass Active Problems:   Hypertension   CKD (chronic kidney disease) stage 4, GFR 15-29 ml/min (HCC)   Hypothyroid   GERD (gastroesophageal reflux disease)   Major depression, recurrent, chronic (HCC)   Past Medical History:  Diagnosis Date   Abdominal adhesions 1994   Allergic rhinitis    Anemia    Anxiety and depression    Aortic stenosis    Arthritis    Atrial fibrillation ([place]) 10/2012   Associated with severe anemia and esophageal pill impaction   Breast carcinoma (HCC)    Right mastectomy   Cholelithiasis    Essential hypertension    Gastroesophageal reflux disease    Hiatal hernia   History of blood transfusion    Hypothyroidism    Low back pain    Malabsorption    Short gut syndrome following small bowel resection surgery x2   Nephrolithiasis 2004   Painless hematuria   Short gut syndrome    Bowel resection , 2004   Upper GI bleed 2004   Multiple episodes of melena-? due to gastritis or adverse drug effect (nonsteroidals, small bowel ulceration with Fosamax); caused by Pepto-Bismol during one Emergency Department evaluation    Surgeries: Procedure(s): CYSTOSCOPY TRANSURETHRAL RESECTION OF BLADDER TUMOR (TURBT) on 04/16/2022   Consultants (if any):   Discharged Condition: Improved  Hospital Course: Jane Andrews is an 88 y.o. female who was admitted 04/16/2022 with a diagnosis of Bladder mass and went to the operating room on 04/16/2022 and underwent the above named procedures.    She was given perioperative antibiotics:  Anti-infectives (From admission, onward)    Start     Dose/Rate Route Frequency Ordered Stop   04/16/22 2200  cefdinir (OMNICEF) capsule 300 mg  Status:  Discontinued        300 mg Oral Every  12 hours 04/16/22 1521 04/18/22 1954   04/16/22 0924  ceFAZolin (ANCEF) 2-4 GM/100ML-% IVPB       Note to Pharmacy: Hinton Rao W: cabinet override      04/16/22 0924 04/16/22 1117   04/16/22 0922  ceFAZolin (ANCEF) IVPB 2g/100 mL premix        2 g 200 mL/hr over 30 Minutes Intravenous 30 min pre-op 04/16/22 0922 04/16/22 1110     .  She was given sequential compression devices, early ambulation for DVT prophylaxis.  She benefited maximally from the hospital stay and there were no complications.    Recent vital signs:  Vitals:   04/18/22 0813 04/18/22 1315  BP: 114/69 (!) 81/47  Pulse: 75 73  Resp: 18 16  Temp:  98.1 F (36.7 C)  SpO2: 98% 96%    Recent laboratory studies:  Lab Results  Component Value Date   HGB 10.2 (L) 04/17/2022   HGB 10.2 (L) 04/16/2022   HGB 10.4 (L) 04/02/2022   Lab Results  Component Value Date   WBC 10.6 (H) 04/17/2022   PLT 177 04/17/2022   Lab Results  Component Value Date   INR 1.1 02/11/2022   Lab Results  Component Value Date   NA 141 04/23/2022   K 4.9 04/23/2022   CL 107 (H) 04/23/2022   CO2 18 (L) 04/23/2022   BUN 37 (H) 04/23/2022   CREATININE 1.87 (H) 04/23/2022   GLUCOSE 108 (H) 04/23/2022  Discharge Medications:   Allergies as of 04/18/2022       Reactions   Codeine Nausea And Vomiting        Medication List     STOP taking these medications    predniSONE 10 MG tablet Commonly known as: DELTASONE       TAKE these medications    ALPRAZolam 0.5 MG tablet Commonly known as: XANAX Take 0.5 mg by mouth at bedtime as needed for anxiety.   amoxicillin-clavulanate 875-125 MG tablet Commonly known as: AUGMENTIN Take 1 tablet by mouth every 12 (twelve) hours.   cyanocobalamin 1000 MCG/ML injection Commonly known as: (VITAMIN B-12) Inject 1,000 mcg into the muscle every 30 (thirty) days.   diphenoxylate-atropine 2.5-0.025 MG tablet Commonly known as: LOMOTIL Take 1 tablet by mouth 4 (four) times  daily as needed for diarrhea or loose stools.   DULoxetine 30 MG capsule Commonly known as: CYMBALTA Take 30 mg by mouth daily. Take with 60 mg for a total of 90 mg daily   DULoxetine 60 MG capsule Commonly known as: CYMBALTA Take 60 mg by mouth See admin instructions. Take with 30 mg for a total of 90 mg daily   fluticasone 50 MCG/ACT nasal spray Commonly known as: FLONASE Place 1 spray into both nostrils 2 (two) times daily. What changed:  when to take this reasons to take this   levothyroxine 75 MCG tablet Commonly known as: SYNTHROID Take 1 tablet (75 mcg total) by mouth daily before breakfast.   metoprolol succinate 25 MG 24 hr tablet Commonly known as: TOPROL-XL Take 1 tablet (25 mg total) by mouth daily.   multivitamins with iron Tabs tablet Take 1 tablet by mouth daily.   pantoprazole 40 MG tablet Commonly known as: PROTONIX Take 1 tablet (40 mg total) by mouth 2 (two) times daily. What changed: when to take this   PROBIOTIC FORMULA PO Take 1 tablet by mouth daily.   sodium bicarbonate 650 MG tablet Take 650 mg by mouth 2 (two) times daily.   traMADol 50 MG tablet Commonly known as: ULTRAM Take 1 tablet (50 mg total) by mouth every 6 (six) hours. What changed:  when to take this reasons to take this   traMADol 50 MG tablet Commonly known as: Ultram Take 1 tablet (50 mg total) by mouth every 6 (six) hours as needed. What changed: You were already taking a medication with the same name, and this prescription was added. Make sure you understand how and when to take each.   vitamin C 500 MG tablet Commonly known as: ASCORBIC ACID Take 500 mg by mouth daily.   VITAMIN D PO Take by mouth daily.        Diagnostic Studies: US RENAL  Result Date: 04/16/2022 CLINICAL DATA:  Bladder mass.  TURBT today. EXAM: RENAL / URINARY TRACT ULTRASOUND COMPLETE COMPARISON:  CT abdomen pelvis 01/24/2022 FINDINGS: Right Kidney: Renal measurements: 10.3 x 5.1 x 4.9 cm =  volume: 134 mL. Large staghorn calculus right renal pelvis. Mild hydronephrosis. Right lower pole cyst 10 mm. Right lower pole cyst 15 mm Left Kidney: Renal measurements: 8.2 x 4.6 x 4.2 cm = volume: 83 mL. Mild left hydronephrosis. No definite calculi. Left upper pole cyst 3 x 4 cm. Small left lateral cyst 6 mm Bladder: Foley catheter present in the bladder. Bladder is empty. No mass is seen in the bladder. Other: None. IMPRESSION: Large staghorn calculus right renal pelvis with mild right hydronephrosis Small kidney with mild left hydronephrosis Foley  catheter in the bladder. Electronically Signed   By: Franchot Gallo M.D.   On: 04/16/2022 17:20   NM PET Image Initial (PI) Skull Base To Thigh (F-18 FDG)  Result Date: 05/02/2022 CLINICAL DATA:  Initial treatment strategy for bladder cancer. EXAM: NUCLEAR MEDICINE PET SKULL BASE TO THIGH TECHNIQUE: 6.46 mCi F-18 FDG was injected intravenously. Full-ring PET imaging was performed from the skull base to thigh after the radiotracer. CT data was obtained and used for attenuation correction and anatomic localization. Fasting blood glucose: 116 mg/dl COMPARISON:  Renal stone protocol CT of February 2023. FINDINGS: Mediastinal blood pool activity: SUV max 2.64 Liver activity: SUV max NA NECK: No hypermetabolic lymph nodes in the neck. Incidental CT findings: none CHEST: No hypermetabolic mediastinal or hilar nodes. No suspicious pulmonary nodules on the CT scan. Incidental CT findings: Aortic atherosclerosis, no aneurysmal dilation of the thoracic aorta. Central pulmonary vasculature is normal caliber. Normal heart size without pericardial effusion. Limited assessment of cardiovascular structures given lack of intravenous contrast. Signs of RIGHT mastectomy and breast reconstruction. No adenopathy by size criteria in the chest. Lungs are clear and airways are patent aside from basilar atelectasis. ABDOMEN/PELVIS: No signs of solid organ metastasis or nodal disease with  increased metabolic activity. At the bladder base there are areas of potential irregularity extending beyond the bladder base though the pattern of FDG uptake could also be related to bladder diverticulum at the bladder base, this is not clear and the area shows substantial beam hardening artifact from RIGHT hip arthroplasty changes limiting assessment of the bladder base. Discrete lesion of the bladder base is not seen by CT. Abnormal contour of the bladder base as evidence by FDG uptake best seen on image 210/3. When windowed differently there are more focal areas of increased metabolic activity with a maximum SUV of 22 though difficult to separate at different when the levels from opacified urine both in the LEFT and RIGHT hemi bladder. Area of bulging at the bladder base inseparable from region of reproductive structures and not well-defined. Incidental CT findings: Post cholecystectomy. No acute findings relative to pancreas, spleen, adrenal glands or gastrointestinal tract. Large staghorn calculus fills the RIGHT renal collecting systems. No LEFT-sided hydronephrosis or distal RIGHT ureteral dilation. Aortic atherosclerosis without aneurysmal dilation. SKELETON: No focal hypermetabolic activity to suggest skeletal metastasis. Incidental CT findings: Spinal degenerative changes and RIGHT hip arthroplasty. IMPRESSION: Irregular contour the urinary bladder could reflect the patient's bladder mass though could also be related to bladder diverticulum or perivesical extension of bladder tumor. Ultrasound correlation could be considered in addition to MRI for further assessment if this would change management. No signs of metastatic disease. Multifocal sites of variable FDG uptake at the bladder base may reflect multifocal involvement or artifact related to attenuation correction, concentrated urine with FDG does not allow for assessment and streak artifact on CT for the limits evaluation. Staghorn calculus of the  RIGHT kidney similar to prior imaging. Aortic atherosclerosis. Electronically Signed   By: Zetta Bills M.D.   On: 05/02/2022 17:04   DG Hand Complete Right  Result Date: 04/10/2022 Images of the right hand chief complaint pain right thumb at the IP joint and metacarpal phalangeal joint. X-rays reveal extensive CMC arthritis arthritis of the interphalangeal joint there is no arthritis at the MP joint Impression CMC arthritis IP joint arthritis  DG C-Arm 1-60 Min-No Report  Result Date: 04/16/2022 Fluoroscopy was utilized by the requesting physician.  No radiographic interpretation.   DG C-Arm 1-60  Min-No Report  Result Date: 04/16/2022 Fluoroscopy was utilized by the requesting physician.  No radiographic interpretation.    Disposition: Discharge disposition: 01-Home or Self Care       Discharge Instructions     Discharge patient   Complete by: As directed    Discharge disposition: 01-Home or Self Care   Discharge patient date: 04/18/2022        Follow-up Information     Stoneking, Reece Leader., MD. Call in 1 week(s).   Specialty: Urology Contact information: [place] [address] [phone]                  Signed: Nicolette Bang 05/07/2022, 8:47 AM

## 2022-05-08 ENCOUNTER — Encounter: Payer: Self-pay | Admitting: Urology

## 2022-05-08 ENCOUNTER — Ambulatory Visit (INDEPENDENT_AMBULATORY_CARE_PROVIDER_SITE_OTHER): Payer: Medicare Other | Admitting: Urology

## 2022-05-08 VITALS — BP 98/60 | HR 76 | Ht 61.0 in | Wt 123.0 lb

## 2022-05-08 DIAGNOSIS — C670 Malignant neoplasm of trigone of bladder: Secondary | ICD-10-CM

## 2022-05-08 DIAGNOSIS — N200 Calculus of kidney: Secondary | ICD-10-CM | POA: Diagnosis not present

## 2022-05-08 LAB — URINALYSIS, ROUTINE W REFLEX MICROSCOPIC
Bilirubin, UA: NEGATIVE
Glucose, UA: NEGATIVE
Ketones, UA: NEGATIVE
Nitrite, UA: NEGATIVE
Specific Gravity, UA: 1.015 (ref 1.005–1.030)
Urobilinogen, Ur: 0.2 mg/dL (ref 0.2–1.0)
pH, UA: 6 (ref 5.0–7.5)

## 2022-05-08 LAB — MICROSCOPIC EXAMINATION
RBC, Urine: >30 /hpf — AB (ref 0–2)
Renal Epithel, UA: NONE SEEN /hpf
WBC, UA: >30 /hpf — AB (ref 0–5)

## 2022-05-08 NOTE — Progress Notes (Signed)
No   Assessment: 1. 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 Nephrolithiasis      Plan: I reviewed options for management of high grade muscle invasive bladder cancer.  Given her advanced age, she is not a candidate for cystectomy and urinary diversion.  Palliative radiation therapy may be an option. I will contact Radiation Oncology to discuss this further and let patient know. Continue Uribel prn Continue Myrbetriq 25 mg daily - samples provided Return to office in 1 month  Chief Complaint: Chief Complaint  Patient presents with   Bladder Cancer    HPI: Jane Andrews is a 88 y.o. female who presents for continued evaluation of dysuria.  She was initially seen in January 2023 for dysuria.  Her symptoms have been present for approximately 2 months.   Urine culture from 12/21/2021 grew 50-100 K mixed flora.   MDX culture from 01/07/2022 showed no organisms. Urine culture from 03/29/2022 grew 10-20 5K mixed flora. No gross hematuria.  She does have urgency, frequency. She continued to have intermittent dysuria, frequency, and incontinence.  No gross hematuria or flank pain. CT renal stone study from 01/26/2022 showed a stable staghorn calculus in the right kidney, tiny left lower pole renal calculi, and a cystic area in the right pelvis/adnexa. Cystoscopy from 4/23 demonstrated a sessile appearing bladder mass in the trigone area. Urine cytology showed dysplastic cells and was FISH positive. She underwent cystoscopy with transurethral resection of the bladder tumor on 04/16/2022.  The UOs were unable to be identified during the procedure.  Pathology showed high-grade urothelial carcinoma with squamous cell component (95%) with invasion of the muscularis propria.  She was discharged from the hospital on postoperative day #2.  Her creatinine remained fairly stable at 2.1 at the time of  discharge.  At her visit on 04/23/2022, she was not having any gross hematuria at the present time.  She was voiding frequently and continued to have dysuria and bladder spasms.  No flank pain or abdominal pain.  No nausea or vomiting.  She had completed her antibiotics. Creatinine from 04/23/2022 stable at 1.87. PET scan from 05/02/2022 showed irregular contour of the bladder consistent with the known bladder cancer, no evidence of metastatic disease, no evidence of ureteral obstruction.  She returns today for follow-up.  She has had symptomatic improvement with the Uribel and Myrbetriq.  No gross hematuria.  Her dysuria is controlled with the Uribel.  Portions of the above documentation were copied from a prior visit for review purposes only.  Allergies: Allergies  Allergen Reactions   Codeine Nausea And Vomiting    PMH: Past Medical History:  Diagnosis Date   Abdominal adhesions 1994   Allergic rhinitis    Anemia    Anxiety and depression    Aortic stenosis    Arthritis    Atrial fibrillation ([place]) 10/2012   Associated with severe anemia and esophageal pill impaction   Breast carcinoma ([place])    Right mastectomy   Cholelithiasis    Essential hypertension    Gastroesophageal reflux disease    Hiatal hernia   History of blood transfusion    Hypothyroidism    Low back pain    Malabsorption    Short gut syndrome following small bowel resection surgery x2   Nephrolithiasis 2004   Painless hematuria   Short gut syndrome    Bowel resection , 2004   Upper GI bleed 2004  Multiple episodes of melena-? due to gastritis or adverse drug effect (nonsteroidals, small bowel ulceration with Fosamax); caused by Pepto-Bismol during one Emergency Department evaluation    PSH: Past Surgical History:  Procedure Laterality Date   ABDOMINAL HYSTERECTOMY     emergency s/p delivery   ABDOMINAL HYSTERECTOMY  1960   massive gynecologic bleeding   BOWEL RESECTION     Resulting short gut  syndrome   CHOLECYSTECTOMY N/A 06/25/2018   Procedure: LAPAROSCOPIC CHOLECYSTECTOMY WITH INTRAOPERATIVE CHOLANGIOGRAM;  Surgeon: Armandina Gemma, MD;  Location: [facility];  Service: General;  Laterality: N/A;   COLONOSCOPY W/ POLYPECTOMY  2005   Lipoma; diverticulosis   COLONOSCOPY WITH ESOPHAGOGASTRODUODENOSCOPY (EGD)  11/22/2012   [facility]   CYSTOSCOPY W/ RETROGRADES Bilateral 04/16/2022   Procedure: CYSTOSCOPY;  Surgeon: Primus Bravo., MD;  Location: [facility];  Service: Urology;  Laterality: Bilateral;   ESOPHAGOGASTRODUODENOSCOPY (EGD) WITH PROPOFOL N/A 04/04/2022   Procedure: ESOPHAGOGASTRODUODENOSCOPY (EGD) WITH PROPOFOL;  Surgeon: Rogene Houston, MD;  Location: [facility];  Service: Endoscopy;  Laterality: N/A;  210   HIP ARTHROPLASTY Right 06/15/2020   Procedure: ANTERIOR ARTHROPLASTY BIPOLAR HIP (HEMIARTHROPLASTY);  Surgeon: Mcarthur Rossetti, MD;  Location: [facility];  Service: Orthopedics;  Laterality: Right;   LAPAROSCOPIC LYSIS OF ADHESIONS  1965   s/p adhesions   MASTECTOMY  right breast   MASTECTOMY     Carcinoma of the breast; right   TRANSURETHRAL RESECTION OF BLADDER TUMOR N/A 04/16/2022   Procedure: TRANSURETHRAL RESECTION OF BLADDER TUMOR (TURBT);  Surgeon: Primus Bravo., MD;  Location: [facility];  Service: Urology;  Laterality: N/A;   UPPER GASTROINTESTINAL ENDOSCOPY      SH: Social History   Tobacco Use   Smoking status: Former    Packs/day: 1.50    Years: 20.00    Pack years: 30.00    Types: Cigarettes   Smokeless tobacco: Never  Vaping Use   Vaping Use: Never used  Substance Use Topics   Alcohol use: No   Drug use: No    ROS: Constitutional:  Negative for fever, chills, weight loss CV: Negative for chest pain, previous MI, hypertension Respiratory:  Negative for shortness of breath, wheezing, sleep apnea, frequent cough GI:  Negative for nausea, vomiting, bloody stool, GERD  PE: BP 98/60   Pulse 76   Ht '5\' 1"'$  (1.549 m)   Wt 123 lb 0.3 oz  (55.8 kg)   BMI 23.24 kg/m  GENERAL APPEARANCE:  Well appearing, well developed, well nourished, NAD HEENT:  Atraumatic, normocephalic, oropharynx clear NECK:  Supple without lymphadenopathy or thyromegaly ABDOMEN:  Soft, non-tender, no masses EXTREMITIES:  Moves all extremities well, without clubbing, cyanosis, or edema NEUROLOGIC:  Alert and oriented x 3, normal gait, CN II-XII grossly intact MENTAL STATUS:  appropriate BACK:  Non-tender to palpation, No CVAT SKIN:  Warm, dry, and intact   Results: U/A:  >30 WBCs, >30 RBCs, few bacteria, nitrite negative

## 2022-05-09 ENCOUNTER — Encounter: Payer: Self-pay | Admitting: Urology

## 2022-05-09 ENCOUNTER — Other Ambulatory Visit: Payer: Self-pay | Admitting: Urology

## 2022-05-09 DIAGNOSIS — C670 Malignant neoplasm of trigone of bladder: Secondary | ICD-10-CM

## 2022-05-13 ENCOUNTER — Emergency Department (HOSPITAL_COMMUNITY): Payer: Medicare Other

## 2022-05-13 ENCOUNTER — Encounter (HOSPITAL_COMMUNITY): Payer: Self-pay

## 2022-05-13 ENCOUNTER — Other Ambulatory Visit: Payer: Self-pay

## 2022-05-13 ENCOUNTER — Inpatient Hospital Stay (HOSPITAL_COMMUNITY)
Admission: EM | Admit: 2022-05-13 | Discharge: 2022-05-17 | DRG: 086 | Disposition: A | Discharge status: Rehabilitation Facility | Payer: Medicare Other | Attending: Surgery | Admitting: Surgery

## 2022-05-13 DIAGNOSIS — K219 Gastro-esophageal reflux disease without esophagitis: Secondary | ICD-10-CM | POA: Diagnosis not present

## 2022-05-13 DIAGNOSIS — S069X0S Unspecified intracranial injury without loss of consciousness, sequela: Secondary | ICD-10-CM | POA: Diagnosis not present

## 2022-05-13 DIAGNOSIS — C679 Malignant neoplasm of bladder, unspecified: Secondary | ICD-10-CM | POA: Diagnosis not present

## 2022-05-13 DIAGNOSIS — Z79890 Hormone replacement therapy (postmenopausal): Secondary | ICD-10-CM

## 2022-05-13 DIAGNOSIS — Z7409 Other reduced mobility: Secondary | ICD-10-CM | POA: Diagnosis not present

## 2022-05-13 DIAGNOSIS — Z043 Encounter for examination and observation following other accident: Secondary | ICD-10-CM | POA: Diagnosis not present

## 2022-05-13 DIAGNOSIS — M47812 Spondylosis without myelopathy or radiculopathy, cervical region: Secondary | ICD-10-CM | POA: Diagnosis present

## 2022-05-13 DIAGNOSIS — I350 Nonrheumatic aortic (valve) stenosis: Secondary | ICD-10-CM | POA: Diagnosis not present

## 2022-05-13 DIAGNOSIS — M4802 Spinal stenosis, cervical region: Secondary | ICD-10-CM | POA: Diagnosis present

## 2022-05-13 DIAGNOSIS — Z885 Allergy status to narcotic agent status: Secondary | ICD-10-CM

## 2022-05-13 DIAGNOSIS — S299XXA Unspecified injury of thorax, initial encounter: Secondary | ICD-10-CM | POA: Diagnosis not present

## 2022-05-13 DIAGNOSIS — S2231XD Fracture of one rib, right side, subsequent encounter for fracture with routine healing: Secondary | ICD-10-CM | POA: Diagnosis not present

## 2022-05-13 DIAGNOSIS — W1830XA Fall on same level, unspecified, initial encounter: Secondary | ICD-10-CM | POA: Diagnosis present

## 2022-05-13 DIAGNOSIS — K529 Noninfective gastroenteritis and colitis, unspecified: Secondary | ICD-10-CM | POA: Diagnosis not present

## 2022-05-13 DIAGNOSIS — Z9011 Acquired absence of right breast and nipple: Secondary | ICD-10-CM

## 2022-05-13 DIAGNOSIS — S0282XA Fracture of other specified skull and facial bones, left side, initial encounter for closed fracture: Secondary | ICD-10-CM | POA: Diagnosis present

## 2022-05-13 DIAGNOSIS — R159 Full incontinence of feces: Secondary | ICD-10-CM | POA: Diagnosis present

## 2022-05-13 DIAGNOSIS — F32A Depression, unspecified: Secondary | ICD-10-CM | POA: Diagnosis present

## 2022-05-13 DIAGNOSIS — D509 Iron deficiency anemia, unspecified: Secondary | ICD-10-CM | POA: Diagnosis present

## 2022-05-13 DIAGNOSIS — R54 Age-related physical debility: Secondary | ICD-10-CM | POA: Diagnosis present

## 2022-05-13 DIAGNOSIS — Y9301 Activity, walking, marching and hiking: Secondary | ICD-10-CM | POA: Diagnosis present

## 2022-05-13 DIAGNOSIS — Z8261 Family history of arthritis: Secondary | ICD-10-CM

## 2022-05-13 DIAGNOSIS — Z90710 Acquired absence of both cervix and uterus: Secondary | ICD-10-CM

## 2022-05-13 DIAGNOSIS — N200 Calculus of kidney: Secondary | ICD-10-CM | POA: Diagnosis not present

## 2022-05-13 DIAGNOSIS — J309 Allergic rhinitis, unspecified: Secondary | ICD-10-CM | POA: Diagnosis not present

## 2022-05-13 DIAGNOSIS — K912 Postsurgical malabsorption, not elsewhere classified: Secondary | ICD-10-CM | POA: Diagnosis present

## 2022-05-13 DIAGNOSIS — N184 Chronic kidney disease, stage 4 (severe): Secondary | ICD-10-CM | POA: Diagnosis present

## 2022-05-13 DIAGNOSIS — R32 Unspecified urinary incontinence: Secondary | ICD-10-CM | POA: Diagnosis not present

## 2022-05-13 DIAGNOSIS — S065X0A Traumatic subdural hemorrhage without loss of consciousness, initial encounter: Secondary | ICD-10-CM | POA: Diagnosis present

## 2022-05-13 DIAGNOSIS — W19XXXA Unspecified fall, initial encounter: Secondary | ICD-10-CM

## 2022-05-13 DIAGNOSIS — S06890A Other specified intracranial injury without loss of consciousness, initial encounter: Secondary | ICD-10-CM | POA: Diagnosis not present

## 2022-05-13 DIAGNOSIS — Z79899 Other long term (current) drug therapy: Secondary | ICD-10-CM

## 2022-05-13 DIAGNOSIS — Z8719 Personal history of other diseases of the digestive system: Secondary | ICD-10-CM

## 2022-05-13 DIAGNOSIS — S2241XA Multiple fractures of ribs, right side, initial encounter for closed fracture: Secondary | ICD-10-CM | POA: Diagnosis not present

## 2022-05-13 DIAGNOSIS — Z515 Encounter for palliative care: Secondary | ICD-10-CM | POA: Diagnosis not present

## 2022-05-13 DIAGNOSIS — E1122 Type 2 diabetes mellitus with diabetic chronic kidney disease: Secondary | ICD-10-CM | POA: Diagnosis not present

## 2022-05-13 DIAGNOSIS — H8110 Benign paroxysmal vertigo, unspecified ear: Secondary | ICD-10-CM | POA: Diagnosis not present

## 2022-05-13 DIAGNOSIS — I129 Hypertensive chronic kidney disease with stage 1 through stage 4 chronic kidney disease, or unspecified chronic kidney disease: Secondary | ICD-10-CM | POA: Diagnosis present

## 2022-05-13 DIAGNOSIS — E039 Hypothyroidism, unspecified: Secondary | ICD-10-CM | POA: Diagnosis present

## 2022-05-13 DIAGNOSIS — E559 Vitamin D deficiency, unspecified: Secondary | ICD-10-CM | POA: Diagnosis not present

## 2022-05-13 DIAGNOSIS — R260 Ataxic gait: Secondary | ICD-10-CM | POA: Diagnosis present

## 2022-05-13 DIAGNOSIS — C670 Malignant neoplasm of trigone of bladder: Secondary | ICD-10-CM | POA: Diagnosis present

## 2022-05-13 DIAGNOSIS — S22009A Unspecified fracture of unspecified thoracic vertebra, initial encounter for closed fracture: Secondary | ICD-10-CM | POA: Diagnosis not present

## 2022-05-13 DIAGNOSIS — X58XXXD Exposure to other specified factors, subsequent encounter: Secondary | ICD-10-CM | POA: Diagnosis present

## 2022-05-13 DIAGNOSIS — S2231XA Fracture of one rib, right side, initial encounter for closed fracture: Secondary | ICD-10-CM | POA: Diagnosis present

## 2022-05-13 DIAGNOSIS — S069XAA Unspecified intracranial injury with loss of consciousness status unknown, initial encounter: Secondary | ICD-10-CM | POA: Diagnosis present

## 2022-05-13 DIAGNOSIS — Z87891 Personal history of nicotine dependence: Secondary | ICD-10-CM

## 2022-05-13 DIAGNOSIS — Z8249 Family history of ischemic heart disease and other diseases of the circulatory system: Secondary | ICD-10-CM

## 2022-05-13 DIAGNOSIS — Z96641 Presence of right artificial hip joint: Secondary | ICD-10-CM | POA: Diagnosis present

## 2022-05-13 DIAGNOSIS — I10 Essential (primary) hypertension: Secondary | ICD-10-CM | POA: Diagnosis not present

## 2022-05-13 DIAGNOSIS — R4189 Other symptoms and signs involving cognitive functions and awareness: Secondary | ICD-10-CM | POA: Diagnosis not present

## 2022-05-13 DIAGNOSIS — M810 Age-related osteoporosis without current pathological fracture: Secondary | ICD-10-CM | POA: Diagnosis present

## 2022-05-13 DIAGNOSIS — R200 Anesthesia of skin: Secondary | ICD-10-CM | POA: Diagnosis present

## 2022-05-13 DIAGNOSIS — R402412 Glasgow coma scale score 13-15, at arrival to emergency department: Secondary | ICD-10-CM | POA: Diagnosis present

## 2022-05-13 DIAGNOSIS — R58 Hemorrhage, not elsewhere classified: Secondary | ICD-10-CM | POA: Diagnosis not present

## 2022-05-13 DIAGNOSIS — N189 Chronic kidney disease, unspecified: Secondary | ICD-10-CM | POA: Diagnosis not present

## 2022-05-13 DIAGNOSIS — S0101XA Laceration without foreign body of scalp, initial encounter: Secondary | ICD-10-CM | POA: Diagnosis present

## 2022-05-13 DIAGNOSIS — Y92019 Unspecified place in single-family (private) house as the place of occurrence of the external cause: Secondary | ICD-10-CM

## 2022-05-13 DIAGNOSIS — S069XAS Unspecified intracranial injury with loss of consciousness status unknown, sequela: Secondary | ICD-10-CM | POA: Diagnosis not present

## 2022-05-13 DIAGNOSIS — S0003XA Contusion of scalp, initial encounter: Secondary | ICD-10-CM | POA: Diagnosis not present

## 2022-05-13 DIAGNOSIS — S06360A Traumatic hemorrhage of cerebrum, unspecified, without loss of consciousness, initial encounter: Secondary | ICD-10-CM | POA: Diagnosis not present

## 2022-05-13 DIAGNOSIS — M5134 Other intervertebral disc degeneration, thoracic region: Secondary | ICD-10-CM | POA: Diagnosis not present

## 2022-05-13 DIAGNOSIS — G729 Myopathy, unspecified: Secondary | ICD-10-CM | POA: Diagnosis not present

## 2022-05-13 DIAGNOSIS — Z9049 Acquired absence of other specified parts of digestive tract: Secondary | ICD-10-CM

## 2022-05-13 DIAGNOSIS — Z87820 Personal history of traumatic brain injury: Secondary | ICD-10-CM | POA: Diagnosis not present

## 2022-05-13 DIAGNOSIS — F329 Major depressive disorder, single episode, unspecified: Secondary | ICD-10-CM | POA: Diagnosis not present

## 2022-05-13 DIAGNOSIS — S14129S Central cord syndrome at unspecified level of cervical spinal cord, sequela: Secondary | ICD-10-CM | POA: Diagnosis not present

## 2022-05-13 DIAGNOSIS — I6200 Nontraumatic subdural hemorrhage, unspecified: Secondary | ICD-10-CM

## 2022-05-13 DIAGNOSIS — F419 Anxiety disorder, unspecified: Secondary | ICD-10-CM | POA: Diagnosis present

## 2022-05-13 DIAGNOSIS — M50223 Other cervical disc displacement at C6-C7 level: Secondary | ICD-10-CM | POA: Diagnosis not present

## 2022-05-13 DIAGNOSIS — I609 Nontraumatic subarachnoid hemorrhage, unspecified: Secondary | ICD-10-CM

## 2022-05-13 DIAGNOSIS — S066X0A Traumatic subarachnoid hemorrhage without loss of consciousness, initial encounter: Secondary | ICD-10-CM | POA: Diagnosis present

## 2022-05-13 DIAGNOSIS — S098XXA Other specified injuries of head, initial encounter: Secondary | ICD-10-CM

## 2022-05-13 DIAGNOSIS — R404 Transient alteration of awareness: Secondary | ICD-10-CM | POA: Diagnosis not present

## 2022-05-13 DIAGNOSIS — M792 Neuralgia and neuritis, unspecified: Secondary | ICD-10-CM | POA: Diagnosis not present

## 2022-05-13 DIAGNOSIS — M79601 Pain in right arm: Secondary | ICD-10-CM | POA: Diagnosis not present

## 2022-05-13 DIAGNOSIS — S4991XA Unspecified injury of right shoulder and upper arm, initial encounter: Secondary | ICD-10-CM | POA: Diagnosis not present

## 2022-05-13 DIAGNOSIS — M542 Cervicalgia: Secondary | ICD-10-CM | POA: Diagnosis not present

## 2022-05-13 DIAGNOSIS — Z66 Do not resuscitate: Secondary | ICD-10-CM | POA: Diagnosis present

## 2022-05-13 DIAGNOSIS — Z7189 Other specified counseling: Secondary | ICD-10-CM | POA: Diagnosis not present

## 2022-05-13 DIAGNOSIS — S0990XA Unspecified injury of head, initial encounter: Secondary | ICD-10-CM | POA: Diagnosis not present

## 2022-05-13 DIAGNOSIS — Z87442 Personal history of urinary calculi: Secondary | ICD-10-CM

## 2022-05-13 DIAGNOSIS — Z825 Family history of asthma and other chronic lower respiratory diseases: Secondary | ICD-10-CM

## 2022-05-13 DIAGNOSIS — Z8679 Personal history of other diseases of the circulatory system: Secondary | ICD-10-CM

## 2022-05-13 DIAGNOSIS — I619 Nontraumatic intracerebral hemorrhage, unspecified: Principal | ICD-10-CM

## 2022-05-13 DIAGNOSIS — Z853 Personal history of malignant neoplasm of breast: Secondary | ICD-10-CM

## 2022-05-13 DIAGNOSIS — W19XXXD Unspecified fall, subsequent encounter: Secondary | ICD-10-CM | POA: Diagnosis present

## 2022-05-13 DIAGNOSIS — S14129D Central cord syndrome at unspecified level of cervical spinal cord, subsequent encounter: Secondary | ICD-10-CM | POA: Diagnosis not present

## 2022-05-13 DIAGNOSIS — R5381 Other malaise: Secondary | ICD-10-CM | POA: Diagnosis not present

## 2022-05-13 LAB — COMPREHENSIVE METABOLIC PANEL
ALT: 12 U/L (ref 0–44)
AST: 20 U/L (ref 15–41)
Albumin: 3.9 g/dL (ref 3.5–5.0)
Alkaline Phosphatase: 75 U/L (ref 38–126)
Anion gap: 7 (ref 5–15)
BUN: 29 mg/dL — ABNORMAL HIGH (ref 8–23)
CO2: 22 mmol/L (ref 22–32)
Calcium: 9.3 mg/dL (ref 8.9–10.3)
Chloride: 108 mmol/L (ref 98–111)
Creatinine, Ser: 1.73 mg/dL — ABNORMAL HIGH (ref 0.44–1.00)
GFR, Estimated: 27 mL/min — ABNORMAL LOW (ref >60)
Glucose, Bld: 160 mg/dL — ABNORMAL HIGH (ref 70–99)
Potassium: 4.6 mmol/L (ref 3.5–5.1)
Sodium: 137 mmol/L (ref 135–145)
Total Bilirubin: 0.3 mg/dL (ref 0.3–1.2)
Total Protein: 6.7 g/dL (ref 6.5–8.1)

## 2022-05-13 LAB — CBC WITH DIFFERENTIAL/PLATELET
Abs Immature Granulocytes: 0.07 10*3/uL (ref 0.00–0.07)
Basophils Absolute: 0 10*3/uL (ref 0.0–0.1)
Basophils Relative: 0 %
Eosinophils Absolute: 0.2 10*3/uL (ref 0.0–0.5)
Eosinophils Relative: 1 %
HCT: 30.5 % — ABNORMAL LOW (ref 36.0–46.0)
Hemoglobin: 9.7 g/dL — ABNORMAL LOW (ref 12.0–15.0)
Immature Granulocytes: 1 %
Lymphocytes Relative: 7 %
Lymphs Abs: 1 10*3/uL (ref 0.7–4.0)
MCH: 29.5 pg (ref 26.0–34.0)
MCHC: 31.8 g/dL (ref 30.0–36.0)
MCV: 92.7 fL (ref 80.0–100.0)
Monocytes Absolute: 0.9 10*3/uL (ref 0.1–1.0)
Monocytes Relative: 7 %
Neutro Abs: 11.2 10*3/uL — ABNORMAL HIGH (ref 1.7–7.7)
Neutrophils Relative %: 84 %
Platelets: 183 10*3/uL (ref 150–400)
RBC: 3.29 MIL/uL — ABNORMAL LOW (ref 3.87–5.11)
RDW: 13.9 % (ref 11.5–15.5)
WBC: 13.4 10*3/uL — ABNORMAL HIGH (ref 4.0–10.5)
nRBC: 0 % (ref 0.0–0.2)

## 2022-05-13 LAB — TSH: TSH: 2.788 u[IU]/mL (ref 0.350–4.500)

## 2022-05-13 LAB — MAGNESIUM: Magnesium: 1.9 mg/dL (ref 1.7–2.4)

## 2022-05-13 LAB — TROPONIN I (HIGH SENSITIVITY): Troponin I (High Sensitivity): 8 ng/L (ref <18)

## 2022-05-13 MED ORDER — DULOXETINE HCL 60 MG PO CPEP: 60.0000 mg | ORAL_CAPSULE | ORAL | Status: DC

## 2022-05-13 MED ORDER — SODIUM BICARBONATE 650 MG PO TABS
650.0000 mg | ORAL_TABLET | Freq: Two times a day (BID) | ORAL | Status: DC
  Administered 2022-05-13 – 2022-05-17 (×8): 650 mg via ORAL
  Filled 2022-05-13 (×8): qty 1

## 2022-05-13 MED ORDER — LEVOTHYROXINE SODIUM 75 MCG PO TABS
75.0000 ug | ORAL_TABLET | Freq: Every day | ORAL | Status: DC
  Administered 2022-05-14 – 2022-05-17 (×4): 75 ug via ORAL
  Filled 2022-05-13 (×4): qty 1

## 2022-05-13 MED ORDER — IOHEXOL 300 MG/ML  SOLN: 100.0000 mL | Freq: Once | INTRAMUSCULAR | Status: DC | PRN

## 2022-05-13 MED ORDER — POTASSIUM CHLORIDE IN NACL 20-0.9 MEQ/L-% IV SOLN
INTRAVENOUS | Status: DC
Start: 2022-05-13 — End: 2022-05-14
  Filled 2022-05-13 (×2): qty 1000

## 2022-05-13 MED ORDER — ONDANSETRON 4 MG PO TBDP: 4.0000 mg | ORAL_TABLET | Freq: Four times a day (QID) | ORAL | Status: DC | PRN

## 2022-05-13 MED ORDER — DULOXETINE HCL 60 MG PO CPEP
90.0000 mg | ORAL_CAPSULE | Freq: Every day | ORAL | Status: DC
  Administered 2022-05-14 – 2022-05-17 (×4): 90 mg via ORAL
  Filled 2022-05-13: qty 3
  Filled 2022-05-13 (×3): qty 1

## 2022-05-13 MED ORDER — LIDOCAINE-EPINEPHRINE-TETRACAINE (LET) TOPICAL GEL
3.0000 mL | Freq: Once | TOPICAL | Status: AC
  Administered 2022-05-13: 3 mL via TOPICAL
  Filled 2022-05-13: qty 3

## 2022-05-13 MED ORDER — ONDANSETRON HCL 4 MG/2ML IJ SOLN
4.0000 mg | Freq: Four times a day (QID) | INTRAMUSCULAR | Status: DC | PRN
  Administered 2022-05-14 – 2022-05-17 (×3): 4 mg via INTRAVENOUS
  Filled 2022-05-13 (×3): qty 2

## 2022-05-13 MED ORDER — LEVETIRACETAM IN NACL 500 MG/100ML IV SOLN
500.0000 mg | Freq: Two times a day (BID) | INTRAVENOUS | Status: DC
  Administered 2022-05-13 – 2022-05-14 (×2): 500 mg via INTRAVENOUS
  Filled 2022-05-13 (×2): qty 100

## 2022-05-13 MED ORDER — MORPHINE SULFATE (PF) 2 MG/ML IV SOLN
2.0000 mg | INTRAVENOUS | Status: DC | PRN
  Administered 2022-05-14: 2 mg via INTRAVENOUS
  Filled 2022-05-13: qty 1

## 2022-05-13 MED ORDER — ALPRAZOLAM 0.25 MG PO TABS
0.5000 mg | ORAL_TABLET | Freq: Every evening | ORAL | Status: DC | PRN
  Administered 2022-05-13 – 2022-05-16 (×3): 0.5 mg via ORAL
  Filled 2022-05-13 (×3): qty 2
  Filled 2022-05-13: qty 1

## 2022-05-13 MED ORDER — METOPROLOL SUCCINATE ER 25 MG PO TB24
25.0000 mg | ORAL_TABLET | Freq: Every day | ORAL | Status: DC
  Administered 2022-05-14 – 2022-05-17 (×4): 25 mg via ORAL
  Filled 2022-05-13 (×4): qty 1

## 2022-05-13 MED ORDER — MIRABEGRON ER 25 MG PO TB24
25.0000 mg | ORAL_TABLET | Freq: Every day | ORAL | Status: DC
  Administered 2022-05-14 – 2022-05-17 (×4): 25 mg via ORAL
  Filled 2022-05-13 (×4): qty 1

## 2022-05-13 MED ORDER — ACETAMINOPHEN 325 MG PO TABS
650.0000 mg | ORAL_TABLET | ORAL | Status: DC | PRN
  Administered 2022-05-14: 650 mg via ORAL
  Filled 2022-05-13: qty 2

## 2022-05-13 MED ORDER — ACETAMINOPHEN 325 MG PO TABS
650.0000 mg | ORAL_TABLET | Freq: Once | ORAL | Status: AC
  Administered 2022-05-13: 650 mg via ORAL
  Filled 2022-05-13: qty 2

## 2022-05-13 MED ORDER — TRAMADOL HCL 50 MG PO TABS
50.0000 mg | ORAL_TABLET | Freq: Four times a day (QID) | ORAL | Status: DC | PRN
  Administered 2022-05-13 – 2022-05-17 (×4): 50 mg via ORAL
  Filled 2022-05-13 (×4): qty 1

## 2022-05-13 NOTE — ED Notes (Signed)
FLOAT NURSE 

## 2022-05-13 NOTE — ED Provider Notes (Signed)
[facility] EMERGENCY DEPARTMENT Provider Note   CSN: [id number] Arrival date & time: 05/13/22  1613     History  Chief Complaint  Patient presents with   Jane Andrews is a 88 y.o. female.  Patient is a 88 year old female with past medical history of A-fib on Toprol XL, no blood thinners, hypothyroidism, and upper GI bleed presenting to emergency department after being found on the floor in her home.  unknown downtime.  Patient has laceration to the back of scalp.  Bruising over the right shoulder blade.  Admits to numbness and tingling in bilateral hands and feet.  Denies sensation loss or motor dysfunction.  Has generalized weakness.  Denies any chest pain, shortness of breath, recent illness, URI symptoms, fevers, chills.  Of note, patient saturated in feces.  Denies any sick contacts.  Denies abdominal pain.  The history is provided by the patient. No language interpreter was used.  Fall Pertinent negatives include no chest pain, no abdominal pain and no shortness of breath.      Home Medications Prior to Admission medications   Medication Sig Start Date End Date Taking? Authorizing Provider  ALPRAZolam Duanne Moron) 0.5 MG tablet Take 0.5 mg by mouth at bedtime as needed for anxiety. 12/19/21   [provider]  Bacillus Coagulans-Inulin (PROBIOTIC FORMULA PO) Take 1 tablet by mouth daily.    [provider]  cyanocobalamin (,VITAMIN B-12,) 1000 MCG/ML injection Inject 1,000 mcg into the muscle every 30 (thirty) days. 07/16/20   [provider]  diphenoxylate-atropine (LOMOTIL) 2.5-0.025 MG tablet Take 1 tablet by mouth 4 (four) times daily as needed for diarrhea or loose stools.    [provider]  DULoxetine (CYMBALTA) 30 MG capsule Take 30 mg by mouth daily. Take with 60 mg for a total of 90 mg daily    [provider]  DULoxetine (CYMBALTA) 60 MG capsule Take 60 mg by mouth See admin instructions. Take with 30 mg for a total  of 90 mg daily 03/30/22   [provider]  fluticasone (FLONASE) 50 MCG/ACT nasal spray Place 1 spray into both nostrils 2 (two) times daily. Patient taking differently: Place 1 spray into both nostrils daily as needed for rhinitis. 02/08/22   Volney American, PA-C  levothyroxine (SYNTHROID) 75 MCG tablet Take 1 tablet (75 mcg total) by mouth daily before breakfast. 07/09/20   Gerlene Fee, NP  Meth-Hyo-M Barnett Hatter Phos-Ph Sal (URIBEL) 118 MG CAPS Take 1 capsule (118 mg total) by mouth 4 (four) times daily as needed (pain with urination). 04/23/22   Stoneking, Reece Leader., MD  metoprolol succinate (TOPROL-XL) 25 MG 24 hr tablet Take 1 tablet (25 mg total) by mouth daily. 07/09/20   Gerlene Fee, NP  mirabegron ER (MYRBETRIQ) 25 MG TB24 tablet Take 1 tablet (25 mg total) by mouth daily. 04/23/22   Stoneking, Reece Leader., MD  Multiple Vitamins-Iron (MULTIVITAMINS WITH IRON) TABS tablet Take 1 tablet by mouth daily. 06/19/20   [provider]  pantoprazole (PROTONIX) 40 MG tablet Take 1 tablet (40 mg total) by mouth 2 (two) times daily. Patient taking differently: Take 40 mg by mouth daily. 07/09/20   Gerlene Fee, NP  sodium bicarbonate 650 MG tablet Take 650 mg by mouth 2 (two) times daily. 09/20/20   [provider]  traMADol (ULTRAM) 50 MG tablet Take 1 tablet (50 mg total) by mouth every 6 (six) hours. Patient taking differently: Take 50 mg by mouth every  6 (six) hours as needed for moderate pain or severe pain. 07/09/20   Gerlene Fee, NP  traMADol (ULTRAM) 50 MG tablet Take 1 tablet (50 mg total) by mouth every 6 (six) hours as needed. 04/18/22 04/18/23  Cleon Gustin, MD  vitamin C (ASCORBIC ACID) 500 MG tablet Take 500 mg by mouth daily.    [provider]  VITAMIN D PO Take by mouth daily.    [provider]      Allergies    Codeine    Review of Systems   Review of Systems  Constitutional:  Negative for chills and fever.  HENT:   Negative for ear pain and sore throat.   Eyes:  Negative for pain and visual disturbance.  Respiratory:  Negative for cough and shortness of breath.   Cardiovascular:  Negative for chest pain and palpitations.  Gastrointestinal:  Positive for diarrhea. Negative for abdominal pain and vomiting.  Genitourinary:  Negative for dysuria and hematuria.  Musculoskeletal:  Negative for arthralgias and back pain.  Skin:  Negative for color change and rash.  Neurological:  Positive for syncope, weakness and numbness. Negative for seizures.  All other systems reviewed and are negative.  Physical Exam Updated Vital Signs BP (!) 155/94   Pulse 91   Temp (!) 97.5 F (36.4 C) (Axillary)   Resp 17   Ht '5\' 1"'$  (1.549 m)   Wt 54.4 kg   SpO2 99%   BMI 22.67 kg/m  Physical Exam Vitals and nursing note reviewed.  Constitutional:      General: She is not in acute distress.    Appearance: She is well-developed.  HENT:     Head: Normocephalic. Laceration present.   Eyes:     General: Lids are normal. Vision grossly intact.     Conjunctiva/sclera: Conjunctivae normal.     Pupils: Pupils are equal, round, and reactive to light.  Cardiovascular:     Rate and Rhythm: Normal rate and regular rhythm.     Heart sounds: No murmur heard. Pulmonary:     Effort: Pulmonary effort is normal. No respiratory distress.     Breath sounds: Normal breath sounds.  Abdominal:     Palpations: Abdomen is soft.     Tenderness: There is no abdominal tenderness.     Comments: Patient saturated in feces.  No evidence of hematochezia or melena.  Musculoskeletal:        General: No swelling.     Right shoulder: Bony tenderness present.     Left shoulder: Normal.     Right upper arm: Tenderness and bony tenderness present.     Left upper arm: Normal.     Right elbow: Normal.     Left elbow: Normal.     Right forearm: Normal.     Left forearm: Normal.     Right wrist: Normal.     Left wrist: Normal.     Right hand:  Normal.     Left hand: Normal.       Arms:     Cervical back: Normal and neck supple.     Thoracic back: Tenderness present.     Lumbar back: Normal.     Right hip: Normal.     Left hip: Normal.     Right upper leg: Normal.     Left upper leg: Normal.     Right knee: Normal.     Left knee: Normal.     Right lower leg: Normal.  Left lower leg: Normal.     Right ankle: Normal.     Left ankle: Normal.     Right foot: Normal.     Left foot: Normal.  Skin:    General: Skin is warm and dry.     Capillary Refill: Capillary refill takes less than 2 seconds.  Neurological:     General: No focal deficit present.     Mental Status: She is alert and oriented to person, place, and time.     GCS: GCS eye subscore is 4. GCS verbal subscore is 5. GCS motor subscore is 6.     Cranial Nerves: Cranial nerves 2-12 are intact.     Sensory: Sensation is intact.     Motor: Motor function is intact.     Comments: Admits to subjective numbness/tingling in bilateral hands and feet. No sensation loss on exam. No motor deficits. Alert, following commands, oriented. Moving all four ext without difficulty.   Psychiatric:        Mood and Affect: Mood normal.    ED Results / Procedures / Treatments   Labs (all labs ordered are listed, but only abnormal results are displayed) Labs Reviewed  CBC WITH DIFFERENTIAL/PLATELET - Abnormal; Notable for the following components:      Result Value   WBC 13.4 (*)    RBC 3.29 (*)    Hemoglobin 9.7 (*)    HCT 30.5 (*)    Neutro Abs 11.2 (*)    All other components within normal limits  COMPREHENSIVE METABOLIC PANEL - Abnormal; Notable for the following components:   Glucose, Bld 160 (*)    BUN 29 (*)    Creatinine, Ser 1.73 (*)    GFR, Estimated 27 (*)    All other components within normal limits  MAGNESIUM  TSH  URINALYSIS, ROUTINE W REFLEX MICROSCOPIC  TROPONIN I (HIGH SENSITIVITY)    EKG None  Radiology DG Chest 1 View  Result Date:  05/13/2022 CLINICAL DATA:  Fall EXAM: CHEST  1 VIEW COMPARISON:  02/10/2022 FINDINGS: Lungs are clear.  No pleural effusion or pneumothorax. The heart is normal in size. Surgical clips along the right chest wall/axilla. IMPRESSION: No evidence of acute cardiopulmonary disease. Electronically Signed   By: Julian Hy M.D.   On: 05/13/2022 17:34   DG Pelvis 1-2 Views  Result Date: 05/13/2022 CLINICAL DATA:  Fall EXAM: PELVIS - 1-2 VIEW COMPARISON:  None Available. FINDINGS: Right hip arthroplasty, without evidence of complication. Left hip joint space is preserved. Visualized bony pelvis appears intact. Degenerative changes of the lower lumbar spine. IMPRESSION: Negative. Electronically Signed   By: Julian Hy M.D.   On: 05/13/2022 17:33   DG Shoulder Right  Result Date: 05/13/2022 CLINICAL DATA:  Right arm pain after fall EXAM: RIGHT SHOULDER - 2+ VIEW COMPARISON:  06/14/2020 FINDINGS: Suspected calcific rotator cuff tendinopathy with superior subluxation of the humeral head with respect to the glenoid, but without dislocation. Suspected fracture of the right second rib laterally. IMPRESSION: 1. Suspected fracture of the right second rib laterally. 2. Mildly superiorly subluxed humeral head, which can sometimes be seen in setting of a chronic rotator cuff tear. There is a suggestion of calcific supraspinatus tendinopathy. No overt dislocation. Electronically Signed   By: Van Clines M.D.   On: 05/13/2022 17:37   CT Head Wo Contrast  Result Date: 05/13/2022 CLINICAL DATA:  Provided history: Head trauma, minor. Additional history provided by scanning technologist: Patient found on floor at home by neighbor. Laceration  to back of head. EXAM: CT HEAD WITHOUT CONTRAST TECHNIQUE: Contiguous axial images were obtained from the base of the skull through the vertex without intravenous contrast. RADIATION DOSE REDUCTION: This exam was performed according to the departmental dose-optimization  program which includes automated exposure control, adjustment of the mA and/or kV according to patient size and/or use of iterative reconstruction technique. COMPARISON:  Prior head CT examinations 02/10/2022 and earlier. FINDINGS: Brain: Mild generalized cerebral atrophy. Small-volume acute subarachnoid hemorrhage overlying the anterolateral left frontal lobe. Small volume subarachnoid hemorrhage is also present along the anteroinferior frontal lobes, bilaterally. Superimposed hemorrhagic parenchymal contusions at these sites cannot be excluded. Small hemorrhagic parenchymal contusions within the anterior right temporal lobe, measuring up to 9 mm, with overlying small volume acute subarachnoid hemorrhage. Thin acute subdural hemorrhage layering along the left tentorium, measuring up to 3 mm in thickness. Moderate patchy and ill-defined hypoattenuation within the cerebral white matter, nonspecific but compatible with chronic small vessel ischemic disease. No demarcated cortical infarct. No evidence of an intracranial mass. No midline shift. Vascular: No hyperdense vessel. Atherosclerotic calcifications Skull: No fracture or aggressive osseous lesion. Sinuses/Orbits: No mass or acute finding within the imaged orbits. Small osteoma within a right ethmoid air cell. Minimal mucosal thickening within a left ethmoid air cell. Other: Bilateral mastoid effusions. Right parietooccipital scalp hematoma. These results were called by telephone at the time of interpretation on 05/13/2022 at 6:01 pm to provider Campbell Stall , who verbally acknowledged these results. IMPRESSION: Small-volume acute subarachnoid hemorrhage overlying the anterolateral left frontal lobe. Small-volume acute subarachnoid hemorrhage also present along the anteroinferior frontal lobes, bilaterally. Superimposed hemorrhagic parenchymal contusions at these sites cannot be excluded. Small hemorrhagic parenchymal contusions within the anterior right temporal  lobe (measuring up to 9 mm) with overlying small-volume acute subarachnoid hemorrhage. Thin acute subdural hemorrhage along the left tentorium, measuring up to 3 mm in thickness. Right parietooccipital scalp hematoma. Moderate chronic small vessel ischemic changes within the cerebral white matter. Mild generalized cerebral atrophy. Bilateral mastoid effusions. Electronically Signed   By: Kellie Simmering D.O.   On: 05/13/2022 18:02   CT Cervical Spine Wo Contrast  Result Date: 05/13/2022 CLINICAL DATA:  Neck trauma. Additional history provided: Patient found on floor at home by neighbor. Laceration to back of head. EXAM: CT CERVICAL SPINE WITHOUT CONTRAST TECHNIQUE: Multidetector CT imaging of the cervical spine was performed without intravenous contrast. Multiplanar CT image reconstructions were also generated. RADIATION DOSE REDUCTION: This exam was performed according to the departmental dose-optimization program which includes automated exposure control, adjustment of the mA and/or kV according to patient size and/or use of iterative reconstruction technique. COMPARISON:  Radiographs of the cervical spine 08/24/2017. Cervical spine CT 08/24/2017. FINDINGS: Alignment: Slight cervical dextrocurvature. 2 mm C2-C3 grade 1 anterolisthesis. 4 mm C3-C4 grade 1 retrolisthesis. Skull base and vertebrae: The basion-dental and atlanto-dental intervals are maintained.No evidence of acute fracture to the cervical spine. Soft tissues and spinal canal: No prevertebral fluid or swelling. No visible canal hematoma. Disc levels: Cervical spondylosis with multilevel disc space narrowing, disc bulges, posterior disc osteophytes, endplate spurring, uncovertebral hypertrophy and facet arthrosis. Disc degeneration is greatest at C3-C4, C4-C5, C5-C6 and C6-C7. Multilevel spinal canal stenosis. Most notably, there is apparent at least moderate spinal canal stenosis at C3-C4. Multilevel bony neural foraminal narrowing. Upper chest: No  consolidation within the imaged lung apices. No visible pneumothorax. IMPRESSION: No evidence of acute fracture to the cervical spine. 2 mm grade 1 anterolisthesis at C2-C3. 4 mm grade  1 retrolisthesis at C3-C4. Cervical spondylosis, as described. Multilevel spinal canal stenosis. Notably, there appears to be at least moderate spinal canal stenosis at C3-C4. Multilevel bony neural foraminal narrowing. Disc degeneration is advanced at C3-C4, C4-C5, C5-C6 and C6-C7. Electronically Signed   By: Kellie Simmering D.O.   On: 05/13/2022 18:10   CT Thoracic Spine Wo Contrast  Result Date: 05/13/2022 CLINICAL DATA:  Found down by neighbor. EXAM: CT THORACIC SPINE WITHOUT CONTRAST TECHNIQUE: Multidetector CT images of the thoracic were obtained using the standard protocol without intravenous contrast. RADIATION DOSE REDUCTION: This exam was performed according to the departmental dose-optimization program which includes automated exposure control, adjustment of the mA and/or kV according to patient size and/or use of iterative reconstruction technique. COMPARISON:  PET-CT dated May 01, 2022. Chest x-ray dated February 08, 2022. CT chest dated August 24, 2017. FINDINGS: Alignment: No traumatic malalignment. Vertebrae: No acute fracture or focal pathologic process. Paraspinal and other soft tissues: Old, partially healed nondisplaced fracture of the posterior left seventh rib. Incompletely visualized right renal staghorn calculus. Disc levels: Mild degenerative disc disease of the mid to lower thoracic spine. No evident high-grade spinal canal or neuroforaminal stenosis. IMPRESSION: 1. No acute osseous abnormality. Electronically Signed   By: Titus Dubin M.D.   On: 05/13/2022 18:02   DG Humerus Right  Result Date: 05/13/2022 CLINICAL DATA:  Trauma, pain EXAM: RIGHT HUMERUS - 2+ VIEW COMPARISON:  None Available. FINDINGS: No recent fracture or dislocation is seen. There is decreased distance between humeral head and  acromion. There is widening of right AC joint space measuring 7 mm. There is no increase in coracoclavicular distance. Surgical clips are seen in the right axilla. IMPRESSION: No recent fracture or dislocation is seen in the right humerus. Decreased distance between humeral head and acromion may suggest chronic tear of rotator cuff. There is some widening of right AC joint space, possibly suggesting a normal variation or right AC separation. Electronically Signed   By: Elmer Picker M.D.   On: 05/13/2022 17:34    Procedures .Critical Care Performed by: Lianne Cure, DO Authorized by: Lianne Cure, DO   Critical care provider statement:    Critical care time (minutes):  100   Critical care was necessary to treat or prevent imminent or life-threatening deterioration of the following conditions: trauma.   Critical care was time spent personally by me on the following activities:  Development of treatment plan with patient or surrogate, discussions with consultants, evaluation of patient's response to treatment, examination of patient, ordering and review of laboratory studies, ordering and review of radiographic studies, ordering and performing treatments and interventions, pulse oximetry, re-evaluation of patient's condition and review of old charts   Care discussed with: admitting provider     Care discussed with comment:  Trauma surgery, neurosurgery, ortho consults .Marland KitchenLaceration Repair  Date/Time: 05/13/2022 7:00 PM Performed by: Lianne Cure, DO Authorized by: Lianne Cure, DO      Medications Ordered in ED Medications  iohexol (OMNIPAQUE) 300 MG/ML solution 100 mL (has no administration in time range)  acetaminophen (TYLENOL) tablet 650 mg (650 mg Oral Given 05/13/22 1703)  lidocaine-EPINEPHrine-tetracaine (LET) topical gel (3 mLs Topical Given 05/13/22 1859)    ED Course/ Medical Decision Making/ A&P                           Medical Decision Making Amount and/or  Complexity of Data Reviewed Labs: ordered. Radiology:  ordered.  Risk OTC drugs. Prescription drug management. Decision regarding hospitalization.   67:45 PM   88 year old female with past medical history of A-fib on Toprol XL, no blood thinners, hypothyroidism, and upper GI bleed presenting to emergency department after being found on the floor in her home.  Patient is alert and oriented x3, no acute distress, afebrile, stable vital signs.  Laceration to posterior scalp.  Bleeding controlled at this time.  Midline thoracic spinal tenderness.  No step-off.  Ecchymosis and tenderness to palpation of right scapula.  Tenderness palpation of the right humerus.  -No c-spine injury -CXR and pelvis stable -Right shoulder xray demonstrates concern for possible rotator cuff injury versus dislocation. Consult to ortho.    -CT head demonstrates: Small-volume acute subarachnoid hemorrhage overlying the anterolateral left frontal lobe.   Small-volume acute subarachnoid hemorrhage also present along the anteroinferior frontal lobes, bilaterally. Superimposed hemorrhagic parenchymal contusions at these sites cannot be excluded.   Small hemorrhagic parenchymal contusions within the anterior right temporal lobe (measuring up to 9 mm) with overlying small-volume acute subarachnoid hemorrhage.   Thin acute subdural hemorrhage along the left tentorium, measuring up to 3 mm in thickness.   Right parietooccipital scalp hematoma.   Moderate chronic small vessel ischemic changes within the cerebral white matter.   Mild generalized cerebral atrophy.   Bilateral mastoid effusions.   -No blood thinner use.  -Posterior scalp lac. Patient left prior to lac repair. Admitting doc up to date and will repair.  -Neuro on consult. Recs no intervention at this time due to patient being awake, alert, and neurovascularly intact despite subjective sensation changes.  -Trauma surgery admitting with Dr. Grandville Silos  to ICU 4th.  Ortho on consult          Final Clinical Impression(s) / ED Diagnoses Final diagnoses:  Intraparenchymal hemorrhage of brain ([place])  Subarachnoid hemorrhage ([place])  Subdural hemorrhage ([place])  Closed fracture of one rib of right side, initial encounter  Fall, initial encounter  Blunt head trauma, initial encounter  Laceration of scalp, initial encounter    Rx / DC Orders ED Discharge Orders     None         Lianne Cure, DO 33/74/45 1522

## 2022-05-13 NOTE — ED Notes (Signed)
C-collar applied

## 2022-05-13 NOTE — ED Triage Notes (Signed)
EMS reports pt was found in the floor at home by neighbor, does not know how long she has been lying there. EMS reports pt has laceration to back of head.

## 2022-05-13 NOTE — H&P (Signed)
Jane Andrews is an 88 y.o. female.   Chief Complaint: fall, dizziness, N/V HPI: 88yo F lives alone and fell while walking from 1 room to another today.  No loss of consciousness.  She is uncertain why she fell.  She was evaluated at [facility] and found to have traumatic brain injury including bilateral frontal subarachnoid hemorrhage, right temporal lobe intracerebral contusion and a left tentorium subdural.  She was also found to have right second rib fracture.  I accepted her in transfer for for admission to the trauma service.  She gets dizzy when she moves her head.  She also complains of tingling in her whole body.  GCS 15.  Past Medical History:  Diagnosis Date   Abdominal adhesions 1994   Allergic rhinitis    Anemia    Anxiety and depression    Aortic stenosis    Arthritis    Atrial fibrillation ([place]) 10/2012   Associated with severe anemia and esophageal pill impaction   Breast carcinoma ([facility])    Right mastectomy   Cholelithiasis    Essential hypertension    Gastroesophageal reflux disease    Hiatal hernia   History of blood transfusion    Hypothyroidism    Low back pain    Malabsorption    Short gut syndrome following small bowel resection surgery x2   Nephrolithiasis 2004   Painless hematuria   Short gut syndrome    Bowel resection , 2004   Upper GI bleed 2004   Multiple episodes of melena-? due to gastritis or adverse drug effect (nonsteroidals, small bowel ulceration with Fosamax); caused by Pepto-Bismol during one Emergency Department evaluation    Past Surgical History:  Procedure Laterality Date   ABDOMINAL HYSTERECTOMY     emergency s/p delivery   ABDOMINAL HYSTERECTOMY  1960   massive gynecologic bleeding   BOWEL RESECTION     Resulting short gut syndrome   CHOLECYSTECTOMY N/A 06/25/2018   Procedure: LAPAROSCOPIC CHOLECYSTECTOMY WITH INTRAOPERATIVE CHOLANGIOGRAM;  Surgeon: Armandina Gemma, MD;  Location: [facility];  Service: General;  Laterality:  N/A;   COLONOSCOPY W/ POLYPECTOMY  2005   Lipoma; diverticulosis   COLONOSCOPY WITH ESOPHAGOGASTRODUODENOSCOPY (EGD)  11/22/2012   [facility]   CYSTOSCOPY W/ RETROGRADES Bilateral 04/16/2022   Procedure: CYSTOSCOPY;  Surgeon: Primus Bravo., MD;  Location: [facility];  Service: Urology;  Laterality: Bilateral;   ESOPHAGOGASTRODUODENOSCOPY (EGD) WITH PROPOFOL N/A 04/04/2022   Procedure: ESOPHAGOGASTRODUODENOSCOPY (EGD) WITH PROPOFOL;  Surgeon: Rogene Houston, MD;  Location: [facility];  Service: Endoscopy;  Laterality: N/A;  210   HIP ARTHROPLASTY Right 06/15/2020   Procedure: ANTERIOR ARTHROPLASTY BIPOLAR HIP (HEMIARTHROPLASTY);  Surgeon: Mcarthur Rossetti, MD;  Location: [facility];  Service: Orthopedics;  Laterality: Right;   LAPAROSCOPIC LYSIS OF ADHESIONS  1965   s/p adhesions   MASTECTOMY  right breast   MASTECTOMY     Carcinoma of the breast; right   TRANSURETHRAL RESECTION OF BLADDER TUMOR N/A 04/16/2022   Procedure: TRANSURETHRAL RESECTION OF BLADDER TUMOR (TURBT);  Surgeon: Primus Bravo., MD;  Location: [facility];  Service: Urology;  Laterality: N/A;   UPPER GASTROINTESTINAL ENDOSCOPY      Family History  Problem Relation Age of Onset   Anuerysm Father    Rheum arthritis Sister    Healthy Sister    COPD Sister    Healthy Brother    Cancer Other    Colon cancer Neg Hx    Social History:  reports that she has quit  smoking. Her smoking use included cigarettes. She has a 30.00 pack-year smoking history. She has never used smokeless tobacco. She reports that she does not drink alcohol and does not use drugs.  Allergies:  Allergies  Allergen Reactions   Codeine Nausea And Vomiting    Medications Prior to Admission  Medication Sig Dispense Refill   ALPRAZolam (XANAX) 0.5 MG tablet Take 0.5 mg by mouth at bedtime as needed for anxiety.     Bacillus Coagulans-Inulin (PROBIOTIC FORMULA PO) Take 1 tablet by mouth daily.     cyanocobalamin (,VITAMIN B-12,) 1000 MCG/ML  injection Inject 1,000 mcg into the muscle every 30 (thirty) days.     diphenoxylate-atropine (LOMOTIL) 2.5-0.025 MG tablet Take 1 tablet by mouth 4 (four) times daily as needed for diarrhea or loose stools.     DULoxetine (CYMBALTA) 30 MG capsule Take 30 mg by mouth daily. Take with 60 mg for a total of 90 mg daily     DULoxetine (CYMBALTA) 60 MG capsule Take 60 mg by mouth See admin instructions. Take with 30 mg for a total of 90 mg daily     fluticasone (FLONASE) 50 MCG/ACT nasal spray Place 1 spray into both nostrils 2 (two) times daily. (Patient taking differently: Place 1 spray into both nostrils daily as needed for rhinitis.) 16 g 0   levothyroxine (SYNTHROID) 75 MCG tablet Take 1 tablet (75 mcg total) by mouth daily before breakfast. 30 tablet 0   Meth-Hyo-M Bl-Na Phos-Ph Sal (URIBEL) 118 MG CAPS Take 1 capsule (118 mg total) by mouth 4 (four) times daily as needed (pain with urination). 20 capsule 5   metoprolol succinate (TOPROL-XL) 25 MG 24 hr tablet Take 1 tablet (25 mg total) by mouth daily. 30 tablet 0   mirabegron ER (MYRBETRIQ) 25 MG TB24 tablet Take 1 tablet (25 mg total) by mouth daily. 28 tablet 0   Multiple Vitamins-Iron (MULTIVITAMINS WITH IRON) TABS tablet Take 1 tablet by mouth daily.     pantoprazole (PROTONIX) 40 MG tablet Take 1 tablet (40 mg total) by mouth 2 (two) times daily. (Patient taking differently: Take 40 mg by mouth daily.) 60 tablet 0   sodium bicarbonate 650 MG tablet Take 650 mg by mouth 2 (two) times daily.     traMADol (ULTRAM) 50 MG tablet Take 1 tablet (50 mg total) by mouth every 6 (six) hours. (Patient taking differently: Take 50 mg by mouth every 6 (six) hours as needed for moderate pain or severe pain.) 60 tablet 0   traMADol (ULTRAM) 50 MG tablet Take 1 tablet (50 mg total) by mouth every 6 (six) hours as needed. 15 tablet 0   vitamin C (ASCORBIC ACID) 500 MG tablet Take 500 mg by mouth daily.     VITAMIN D PO Take by mouth daily.      Results for  orders placed or performed during the hospital encounter of 05/13/22 (from the past 48 hour(s))  CBC with Differential     Status: Abnormal   Collection Time: 05/13/22  4:52 PM  Result Value Ref Range   WBC 13.4 (H) 4.0 - 10.5 K/uL   RBC 3.29 (L) 3.87 - 5.11 MIL/uL   Hemoglobin 9.7 (L) 12.0 - 15.0 g/dL   HCT 30.5 (L) 36.0 - 46.0 %   MCV 92.7 80.0 - 100.0 fL   MCH 29.5 26.0 - 34.0 pg   MCHC 31.8 30.0 - 36.0 g/dL   RDW 13.9 11.5 - 15.5 %   Platelets 183 150 - 400  K/uL   nRBC 0.0 0.0 - 0.2 %   Neutrophils Relative % 84 %   Neutro Abs 11.2 (H) 1.7 - 7.7 K/uL   Lymphocytes Relative 7 %   Lymphs Abs 1.0 0.7 - 4.0 K/uL   Monocytes Relative 7 %   Monocytes Absolute 0.9 0.1 - 1.0 K/uL   Eosinophils Relative 1 %   Eosinophils Absolute 0.2 0.0 - 0.5 K/uL   Basophils Relative 0 %   Basophils Absolute 0.0 0.0 - 0.1 K/uL   Immature Granulocytes 1 %   Abs Immature Granulocytes 0.07 0.00 - 0.07 K/uL    Comment: Performed at [facility], [address], [place], [place] [address]  Comprehensive metabolic panel     Status: Abnormal   Collection Time: 05/13/22  4:52 PM  Result Value Ref Range   Sodium 137 135 - 145 mmol/L   Potassium 4.6 3.5 - 5.1 mmol/L   Chloride 108 98 - 111 mmol/L   CO2 22 22 - 32 mmol/L   Glucose, Bld 160 (H) 70 - 99 mg/dL    Comment: Glucose reference range applies only to samples taken after fasting for at least 8 hours.   BUN 29 (H) 8 - 23 mg/dL   Creatinine, Ser 1.73 (H) 0.44 - 1.00 mg/dL   Calcium 9.3 8.9 - 10.3 mg/dL   Total Protein 6.7 6.5 - 8.1 g/dL   Albumin 3.9 3.5 - 5.0 g/dL   AST 20 15 - 41 U/L   ALT 12 0 - 44 U/L   Alkaline Phosphatase 75 38 - 126 U/L   Total Bilirubin 0.3 0.3 - 1.2 mg/dL   GFR, Estimated 27 (L) >60 mL/min    Comment: (NOTE) Calculated using the CKD-EPI Creatinine Equation (2021)    Anion gap 7 5 - 15    Comment: Performed at [facility], [address] [place], [place] [address]  Troponin I (High Sensitivity)     Status: None    Collection Time: 05/13/22  4:52 PM  Result Value Ref Range   Troponin I (High Sensitivity) 8 <18 ng/L    Comment: (NOTE) Elevated high sensitivity troponin I (hsTnI) values and significant  changes across serial measurements may suggest ACS but many other  chronic and acute conditions are known to elevate hsTnI results.  Refer to the "Links" section for chest pain algorithms and additional  guidance. Performed at [facility], [address], [place], [place] [address]   Magnesium     Status: None   Collection Time: 05/13/22  4:52 PM  Result Value Ref Range   Magnesium 1.9 1.7 - 2.4 mg/dL    Comment: Performed at [facility], [address] [place], [place] [address]  TSH     Status: None   Collection Time: 05/13/22  4:52 PM  Result Value Ref Range   TSH 2.788 0.350 - 4.500 uIU/mL    Comment: Performed by a 3rd Generation assay with a functional sensitivity of <=0.01 uIU/mL. Performed at [facility], [address] [place], [place] [address]    DG Chest 1 View  Result Date: 05/13/2022 CLINICAL DATA:  Fall EXAM: CHEST  1 VIEW COMPARISON:  02/10/2022 FINDINGS: Lungs are clear.  No pleural effusion or pneumothorax. The heart is normal in size. Surgical clips along the right chest wall/axilla. IMPRESSION: No evidence of acute cardiopulmonary disease. Electronically Signed   By: Julian Hy M.D.   On: 05/13/2022 17:34   DG Pelvis 1-2 Views  Result Date: 05/13/2022 CLINICAL DATA:  Fall EXAM: PELVIS - 1-2 VIEW COMPARISON:  None Available. FINDINGS: Right hip arthroplasty, without evidence of complication. Left hip joint space is preserved. Visualized bony pelvis appears intact. Degenerative changes of the lower lumbar spine. IMPRESSION: Negative. Electronically Signed   By: Julian Hy M.D.   On: 05/13/2022 17:33   DG Shoulder Right  Result Date: 05/13/2022 CLINICAL DATA:  Right arm pain after fall EXAM: RIGHT SHOULDER - 2+ VIEW COMPARISON:  06/14/2020 FINDINGS: Suspected  calcific rotator cuff tendinopathy with superior subluxation of the humeral head with respect to the glenoid, but without dislocation. Suspected fracture of the right second rib laterally. IMPRESSION: 1. Suspected fracture of the right second rib laterally. 2. Mildly superiorly subluxed humeral head, which can sometimes be seen in setting of a chronic rotator cuff tear. There is a suggestion of calcific supraspinatus tendinopathy. No overt dislocation. Electronically Signed   By: Van Clines M.D.   On: 05/13/2022 17:37   CT Head Wo Contrast  Result Date: 05/13/2022 CLINICAL DATA:  Provided history: Head trauma, minor. Additional history provided by scanning technologist: Patient found on floor at home by neighbor. Laceration to back of head. EXAM: CT HEAD WITHOUT CONTRAST TECHNIQUE: Contiguous axial images were obtained from the base of the skull through the vertex without intravenous contrast. RADIATION DOSE REDUCTION: This exam was performed according to the departmental dose-optimization program which includes automated exposure control, adjustment of the mA and/or kV according to patient size and/or use of iterative reconstruction technique. COMPARISON:  Prior head CT examinations 02/10/2022 and earlier. FINDINGS: Brain: Mild generalized cerebral atrophy. Small-volume acute subarachnoid hemorrhage overlying the anterolateral left frontal lobe. Small volume subarachnoid hemorrhage is also present along the anteroinferior frontal lobes, bilaterally. Superimposed hemorrhagic parenchymal contusions at these sites cannot be excluded. Small hemorrhagic parenchymal contusions within the anterior right temporal lobe, measuring up to 9 mm, with overlying small volume acute subarachnoid hemorrhage. Thin acute subdural hemorrhage layering along the left tentorium, measuring up to 3 mm in thickness. Moderate patchy and ill-defined hypoattenuation within the cerebral white matter, nonspecific but compatible with  chronic small vessel ischemic disease. No demarcated cortical infarct. No evidence of an intracranial mass. No midline shift. Vascular: No hyperdense vessel. Atherosclerotic calcifications Skull: No fracture or aggressive osseous lesion. Sinuses/Orbits: No mass or acute finding within the imaged orbits. Small osteoma within a right ethmoid air cell. Minimal mucosal thickening within a left ethmoid air cell. Other: Bilateral mastoid effusions. Right parietooccipital scalp hematoma. These results were called by telephone at the time of interpretation on 05/13/2022 at 6:01 pm to provider Campbell Stall , who verbally acknowledged these results. IMPRESSION: Small-volume acute subarachnoid hemorrhage overlying the anterolateral left frontal lobe. Small-volume acute subarachnoid hemorrhage also present along the anteroinferior frontal lobes, bilaterally. Superimposed hemorrhagic parenchymal contusions at these sites cannot be excluded. Small hemorrhagic parenchymal contusions within the anterior right temporal lobe (measuring up to 9 mm) with overlying small-volume acute subarachnoid hemorrhage. Thin acute subdural hemorrhage along the left tentorium, measuring up to 3 mm in thickness. Right parietooccipital scalp hematoma. Moderate chronic small vessel ischemic changes within the cerebral white matter. Mild generalized cerebral atrophy. Bilateral mastoid effusions. Electronically Signed   By: Kellie Simmering D.O.   On: 05/13/2022 18:02   CT Cervical Spine Wo Contrast  Result Date: 05/13/2022 CLINICAL DATA:  Neck trauma. Additional history provided: Patient found on floor at home by neighbor. Laceration to back of head. EXAM: CT CERVICAL SPINE WITHOUT CONTRAST TECHNIQUE: Multidetector CT imaging of the cervical spine  was performed without intravenous contrast. Multiplanar CT image reconstructions were also generated. RADIATION DOSE REDUCTION: This exam was performed according to the departmental dose-optimization program  which includes automated exposure control, adjustment of the mA and/or kV according to patient size and/or use of iterative reconstruction technique. COMPARISON:  Radiographs of the cervical spine 08/24/2017. Cervical spine CT 08/24/2017. FINDINGS: Alignment: Slight cervical dextrocurvature. 2 mm C2-C3 grade 1 anterolisthesis. 4 mm C3-C4 grade 1 retrolisthesis. Skull base and vertebrae: The basion-dental and atlanto-dental intervals are maintained.No evidence of acute fracture to the cervical spine. Soft tissues and spinal canal: No prevertebral fluid or swelling. No visible canal hematoma. Disc levels: Cervical spondylosis with multilevel disc space narrowing, disc bulges, posterior disc osteophytes, endplate spurring, uncovertebral hypertrophy and facet arthrosis. Disc degeneration is greatest at C3-C4, C4-C5, C5-C6 and C6-C7. Multilevel spinal canal stenosis. Most notably, there is apparent at least moderate spinal canal stenosis at C3-C4. Multilevel bony neural foraminal narrowing. Upper chest: No consolidation within the imaged lung apices. No visible pneumothorax. IMPRESSION: No evidence of acute fracture to the cervical spine. 2 mm grade 1 anterolisthesis at C2-C3. 4 mm grade 1 retrolisthesis at C3-C4. Cervical spondylosis, as described. Multilevel spinal canal stenosis. Notably, there appears to be at least moderate spinal canal stenosis at C3-C4. Multilevel bony neural foraminal narrowing. Disc degeneration is advanced at C3-C4, C4-C5, C5-C6 and C6-C7. Electronically Signed   By: Kellie Simmering D.O.   On: 05/13/2022 18:10   CT Thoracic Spine Wo Contrast  Result Date: 05/13/2022 CLINICAL DATA:  Found down by neighbor. EXAM: CT THORACIC SPINE WITHOUT CONTRAST TECHNIQUE: Multidetector CT images of the thoracic were obtained using the standard protocol without intravenous contrast. RADIATION DOSE REDUCTION: This exam was performed according to the departmental dose-optimization program which includes  automated exposure control, adjustment of the mA and/or kV according to patient size and/or use of iterative reconstruction technique. COMPARISON:  PET-CT dated May 01, 2022. Chest x-ray dated February 08, 2022. CT chest dated August 24, 2017. FINDINGS: Alignment: No traumatic malalignment. Vertebrae: No acute fracture or focal pathologic process. Paraspinal and other soft tissues: Old, partially healed nondisplaced fracture of the posterior left seventh rib. Incompletely visualized right renal staghorn calculus. Disc levels: Mild degenerative disc disease of the mid to lower thoracic spine. No evident high-grade spinal canal or neuroforaminal stenosis. IMPRESSION: 1. No acute osseous abnormality. Electronically Signed   By: Titus Dubin M.D.   On: 05/13/2022 18:02   DG Humerus Right  Result Date: 05/13/2022 CLINICAL DATA:  Trauma, pain EXAM: RIGHT HUMERUS - 2+ VIEW COMPARISON:  None Available. FINDINGS: No recent fracture or dislocation is seen. There is decreased distance between humeral head and acromion. There is widening of right AC joint space measuring 7 mm. There is no increase in coracoclavicular distance. Surgical clips are seen in the right axilla. IMPRESSION: No recent fracture or dislocation is seen in the right humerus. Decreased distance between humeral head and acromion may suggest chronic tear of rotator cuff. There is some widening of right AC joint space, possibly suggesting a normal variation or right AC separation. Electronically Signed   By: Elmer Picker M.D.   On: 05/13/2022 17:34    Review of Systems  Constitutional: Negative.   HENT: Negative.    Eyes: Negative.   Respiratory: Negative.    Cardiovascular: Negative.   Gastrointestinal:  Positive for nausea and vomiting.  Endocrine: Negative.   Genitourinary: Negative.   Allergic/Immunologic: Negative.   Neurological:  Positive for dizziness.  Tingling in her whole body  Hematological: Negative.    Psychiatric/Behavioral: Negative.     Blood pressure (!) 164/90, pulse 77, temperature 97.9 F (36.6 C), temperature source Oral, resp. rate 16, height '5\' 1"'$  (1.549 m), weight 54.4 kg, SpO2 (!) 86 %. Physical Exam Constitutional:      Appearance: She is not ill-appearing.  HENT:     Head:     Comments: Scalp hematoma    Nose: Nose normal.     Mouth/Throat:     Mouth: Mucous membranes are moist.  Eyes:     General: No scleral icterus.    Extraocular Movements: Extraocular movements intact.     Pupils: Pupils are equal, round, and reactive to light.  Cardiovascular:     Rate and Rhythm: Normal rate and regular rhythm.     Pulses: Normal pulses.     Heart sounds: Normal heart sounds.  Pulmonary:     Effort: Pulmonary effort is normal. No respiratory distress.     Breath sounds: No wheezing or rhonchi.  Abdominal:     General: Abdomen is flat.     Palpations: Abdomen is soft.     Tenderness: There is no abdominal tenderness. There is no guarding or rebound.  Musculoskeletal:        General: No swelling or tenderness. Normal range of motion.     Cervical back: Neck supple. No tenderness.  Skin:    General: Skin is warm.     Capillary Refill: Capillary refill takes 2 to 3 seconds.  Neurological:     Mental Status: She is alert and oriented to person, place, and time.     Cranial Nerves: No cranial nerve deficit.     Comments: GCS 15  Psychiatric:        Mood and Affect: Mood normal.     Assessment/Plan GLF  TBI, bilateral frontal subarachnoid hemorrhage, right temporal lobe intracerebral contusion and a left tentorium subdural  R 2nd rib FX   Admit to inpatient, Dr. Annette Stable will consult from neurosurgery.  Plan TBI team therapies.  Will await Dr. Marchelle Folks assessment of need for follow-up CT scan.  Pain control and pulmonary toilet for her rib fracture.   Zenovia Jarred, MD 05/13/2022, 10:15 PM

## 2022-05-14 LAB — BASIC METABOLIC PANEL
Anion gap: 6 (ref 5–15)
BUN: 24 mg/dL — ABNORMAL HIGH (ref 8–23)
CO2: 23 mmol/L (ref 22–32)
Calcium: 9.2 mg/dL (ref 8.9–10.3)
Chloride: 108 mmol/L (ref 98–111)
Creatinine, Ser: 1.61 mg/dL — ABNORMAL HIGH (ref 0.44–1.00)
GFR, Estimated: 29 mL/min — ABNORMAL LOW (ref >60)
Glucose, Bld: 115 mg/dL — ABNORMAL HIGH (ref 70–99)
Potassium: 4.2 mmol/L (ref 3.5–5.1)
Sodium: 137 mmol/L (ref 135–145)

## 2022-05-14 LAB — CBC
HCT: 28.3 % — ABNORMAL LOW (ref 36.0–46.0)
Hemoglobin: 9.4 g/dL — ABNORMAL LOW (ref 12.0–15.0)
MCH: 29.7 pg (ref 26.0–34.0)
MCHC: 33.2 g/dL (ref 30.0–36.0)
MCV: 89.3 fL (ref 80.0–100.0)
Platelets: 183 10*3/uL (ref 150–400)
RBC: 3.17 MIL/uL — ABNORMAL LOW (ref 3.87–5.11)
RDW: 13.6 % (ref 11.5–15.5)
WBC: 9.4 10*3/uL (ref 4.0–10.5)
nRBC: 0 % (ref 0.0–0.2)

## 2022-05-14 LAB — MRSA NEXT GEN BY PCR, NASAL: MRSA by PCR Next Gen: NOT DETECTED

## 2022-05-14 MED ORDER — LEVETIRACETAM 500 MG PO TABS
500.0000 mg | ORAL_TABLET | Freq: Two times a day (BID) | ORAL | Status: DC
Start: 2022-05-14 — End: 2022-05-17
  Administered 2022-05-14 – 2022-05-17 (×6): 500 mg via ORAL
  Filled 2022-05-14 (×6): qty 1

## 2022-05-14 MED ORDER — ACETAMINOPHEN 500 MG PO TABS
1000.0000 mg | ORAL_TABLET | Freq: Four times a day (QID) | ORAL | Status: DC
  Administered 2022-05-14 – 2022-05-17 (×12): 1000 mg via ORAL
  Filled 2022-05-14 (×13): qty 2

## 2022-05-14 MED ORDER — CHLORHEXIDINE GLUCONATE CLOTH 2 % EX PADS
6.0000 | MEDICATED_PAD | Freq: Every day | CUTANEOUS | Status: DC
  Administered 2022-05-14 – 2022-05-17 (×4): 6 via TOPICAL

## 2022-05-14 MED ORDER — MORPHINE SULFATE (PF) 2 MG/ML IV SOLN
1.0000 mg | INTRAVENOUS | Status: DC | PRN
  Administered 2022-05-15: 2 mg via INTRAVENOUS
  Filled 2022-05-14: qty 1

## 2022-05-14 MED ORDER — ENOXAPARIN SODIUM 30 MG/0.3ML IJ SOSY
30.0000 mg | PREFILLED_SYRINGE | INTRAMUSCULAR | Status: DC
  Administered 2022-05-16 – 2022-05-17 (×2): 30 mg via SUBCUTANEOUS
  Filled 2022-05-14 (×2): qty 0.3

## 2022-05-14 MED ORDER — METHOCARBAMOL 750 MG PO TABS
750.0000 mg | ORAL_TABLET | Freq: Four times a day (QID) | ORAL | Status: DC
  Administered 2022-05-14 – 2022-05-17 (×13): 750 mg via ORAL
  Filled 2022-05-14 (×13): qty 1

## 2022-05-14 NOTE — Evaluation (Signed)
Occupational Therapy Evaluation Patient Details Name: SARHA BARTELT MRN: [id number] DOB: February 21, 1925 Today's Date: 05/14/2022   History of Present Illness 88 y.o. female presents to [facility] hospital on 05/13/2022 after sustaining a fall. 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 Pt also with R 2nd rib fx. PMH includes anxiety, depression, afib, breast CA, HTN, GIB, GERD.   Clinical Impression   Pt is typically independent in ADL and mobility, still drives, enjoys going out for meals with friends. Today reports decreased sensation globally in all 4 extremities. Describes as "tingly" and "It makes it hard to move my arms and lefs" Pt sleepy during session, suspect medication, does arouse and will answer questions and participate in therapy. Pt mod A to come EOB, once EOB total A for LB dressing, mostly min guard for seated balance with intermittent min support for seated balance. Mod A for UB ADL, max to total for LB ADL at this time. Grasp in weak in BUE, provided with built up handles which enabled her to eat cheerios successfully, and she reports easier. Pt was min A +2 for sit<>stand transfer with knees buckling EOB initially requiring blocking. Pt reports dizziness with positional changes and head turns. OT will continue to follow acutely and at this time recommending inpatient rehab to maximize safety and independence in ADL and functional transfers and return Pt to PLOF.      Recommendations for follow up therapy are one component of a multi-disciplinary discharge planning process, led by the attending physician.  Recommendations may be updated based on patient status, additional functional criteria and insurance authorization.   Follow Up Recommendations  Acute inpatient rehab (3hours/day)    Assistance Recommended at Discharge Frequent or constant Supervision/Assistance  Patient can return home with the following A lot of help  with walking and/or transfers;A lot of help with bathing/dressing/bathroom;Assistance with cooking/housework;Assistance with feeding;Direct supervision/assist for medications management;Direct supervision/assist for financial management;Assist for transportation;Help with stairs or ramp for entrance    Functional Status Assessment  Patient has had a recent decline in their functional status and demonstrates the ability to make significant improvements in function in a reasonable and predictable amount of time.  Equipment Recommendations  Other (comment) (defer to next venue of care)    Recommendations for Other Services Rehab consult     Precautions / Restrictions Precautions Precautions: Fall Restrictions Weight Bearing Restrictions: No Other Position/Activity Restrictions: 2nd rib fx      Mobility Bed Mobility Overal bed mobility: Needs Assistance Bed Mobility: Supine to Sit     Supine to sit: Mod assist, +2 for safety/equipment, HOB elevated     General bed mobility comments: vc for sequencing step by step, assist for trunk elevation and hips to EOB with use of bed pad    Transfers Overall transfer level: Needs assistance Equipment used: 2 person hand held assist Transfers: Sit to/from Stand Sit to Stand: Min assist, +2 safety/equipment, +2 physical assistance, From elevated surface (ICU bed)           General transfer comment: initially knees buckling, min A for balance and steadying as well as line management leans to left in standing      Balance Overall balance assessment: Needs assistance Sitting-balance support: Bilateral upper extremity supported Sitting balance-Leahy Scale: Poor Sitting balance - Comments: min guard with intermittent min support Postural control: Left lateral lean, Posterior lean Standing balance support: Bilateral upper extremity supported Standing balance-Leahy Scale: Poor Standing balance comment:  dependent on therapist assist for  balance in standing, strong left lean during BP reading                           ADL either performed or assessed with clinical judgement   ADL Overall ADL's : Needs assistance/impaired Eating/Feeding: Minimal assistance;With adaptive utensils Eating/Feeding Details (indicate cue type and reason): built up handles on spoon to assist with self-feeding - will need red tubing Grooming: Moderate assistance;Sitting;Wash/dry face Grooming Details (indicate cue type and reason): able to bring hand to face with grooming items, initially stating "I can't do it" Upper Body Bathing: Maximal assistance   Lower Body Bathing: Maximal assistance   Upper Body Dressing : Moderate assistance;Sitting   Lower Body Dressing: Total assistance;Sitting/lateral leans Lower Body Dressing Details (indicate cue type and reason): to don socks Toilet Transfer: Minimal assistance;+2 for physical assistance;+2 for safety/equipment;Stand-pivot Toilet Transfer Details (indicate cue type and reason): 2 person HHA Toileting- Clothing Manipulation and Hygiene: Maximal assistance;Sit to/from stand       Functional mobility during ADLs: Minimal assistance;+2 for physical assistance;+2 for safety/equipment (2 person HHA) General ADL Comments: Pt with overall feeling of unwell, "tingling" all over and decreased strength and grasp in BUE     Vision Baseline Vision/History: 1 Wears glasses Patient Visual Report: Nausea/blurring vision with head movement (dizziness with head turns and general unwell feeling) Vision Assessment?: Vision impaired- to be further tested in functional context Additional Comments: watch for nystagmus     Perception     Praxis      Pertinent Vitals/Pain Pain Assessment Pain Assessment: Faces Faces Pain Scale: Hurts a little bit Pain Location: headache Pain Descriptors / Indicators: Headache Pain Intervention(s): Limited activity within patient's tolerance, Monitored during  session, Repositioned     Hand Dominance Right   Extremity/Trunk Assessment Upper Extremity Assessment Upper Extremity Assessment: Generalized weakness (potential RUE rotator cuff injury, limited evaluation today due to dizzness)   Lower Extremity Assessment Lower Extremity Assessment: Defer to PT evaluation   Cervical / Trunk Assessment Cervical / Trunk Assessment: Kyphotic   Communication Communication Communication: No difficulties   Cognition Arousal/Alertness: Suspect due to medications Behavior During Therapy: Flat affect Overall Cognitive Status: Impaired/Different from baseline Area of Impairment: Following commands, Awareness, Problem solving                       Following Commands: Follows one step commands consistently, Follows one step commands with increased time   Awareness: Emergent Problem Solving: Decreased initiation, Difficulty sequencing, Requires verbal cues, Requires tactile cues General Comments: Pt frequently stating "I can't do that" Pt trouble coordinating gross motor movements, conversationaly appropriate however frequently falling asleep (neice in room states she has been this way since getting morphine at 2/3am)     General Comments  Pt complaining of "tingling all over" Niece present throughout    Exercises     Shoulder Instructions      Home Living Family/patient expects to be discharged to:: Private residence Living Arrangements: Alone Available Help at Discharge: Family;Available 24 hours/day Type of Home: House Home Access: Stairs to enter [employer] of Steps: 3 Entrance Stairs-Rails: Right;Left Home Layout: Two level Alternate Level Stairs-Number of Steps: 20 Alternate Level Stairs-Rails: Right Bathroom Shower/Tub: Teacher, early years/pre: Standard Bathroom Accessibility: Yes How Accessible: Accessible via walker Home Equipment: [place] (2 wheels);Cane - single point;Shower seat;Grab bars -  tub/shower  Lives With: Alone    Prior Functioning/Environment Prior Level of Function : Independent/Modified Independent;Driving             Mobility Comments: household ambulation without AD ADLs Comments: independent        OT Problem List:        OT Treatment/Interventions: Self-care/ADL training;Neuromuscular education;DME and/or AE instruction;Therapeutic activities;Cognitive remediation/compensation;Patient/family education;Balance training    OT Goals(Current goals can be found in the care plan section) Acute Rehab OT Goals Patient Stated Goal: get some food OT Goal Formulation: With patient/family Time For Goal Achievement: 05/28/22 Potential to Achieve Goals: Good ADL Goals Pt Will Perform Eating: with modified independence;with adaptive utensils;sitting Pt Will Perform Grooming: with modified independence;sitting Pt Will Perform Upper Body Bathing: with set-up;sitting Pt Will Perform Lower Body Bathing: with set-up;sitting/lateral leans Pt Will Perform Upper Body Dressing: with set-up;sitting Pt Will Perform Lower Body Dressing: with supervision;sit to/from stand Pt Will Transfer to Toilet: with supervision;ambulating Pt Will Perform Toileting - Clothing Manipulation and hygiene: with supervision;sit to/from stand  OT Frequency: Min 2X/week    Co-evaluation PT/OT/SLP Co-Evaluation/Treatment: Yes Reason for Co-Treatment: Necessary to address cognition/behavior during functional activity;For patient/therapist safety;To address functional/ADL transfers PT goals addressed during session: Mobility/safety with mobility;Balance;Strengthening/ROM OT goals addressed during session: ADL's and self-care;Strengthening/ROM      AM-PAC OT "6 Clicks" Daily Activity     Outcome Measure Help from another person eating meals?: A Lot Help from another person taking care of personal grooming?: A Lot Help from another person toileting, which includes using toliet, bedpan,  or urinal?: A Lot Help from another person bathing (including washing, rinsing, drying)?: A Lot Help from another person to put on and taking off regular upper body clothing?: A Lot Help from another person to put on and taking off regular lower body clothing?: Total 6 Click Score: 11   End of Session Equipment Utilized During Treatment: Gait belt Nurse Communication: Mobility status;Precautions  Activity Tolerance: Patient tolerated treatment well (impacted by lethargy - suspect medication) Patient left: in bed;with call bell/phone within reach;with bed alarm set;with family/visitor present;with SCD's reapplied  OT Visit Diagnosis: Unsteadiness on feet (R26.81);Other abnormalities of gait and mobility (R26.89);Muscle weakness (generalized) (M62.81);Other symptoms and signs involving the nervous system (R29.898);Other symptoms and signs involving cognitive function                Time: 0254-2706 OT Time Calculation (min): 31 min Charges:  OT General Charges $OT Visit: 1 Visit OT Evaluation $OT Eval Moderate Complexity: [place] OTR/L Acute Rehabilitation Services Pager: [phone] Office: [place] 05/14/2022, 11:31 AM

## 2022-05-14 NOTE — Consult Note (Signed)
Reason for Consult: Left frontal contusion Referring Physician: Trauma  Jane Andrews is an 88 y.o. female.  HPI: 88 year old female status post fall of unknown etiology.  Unknown loss of consciousness.  Patient evaluated at outside emergency department and was found to have a small left frontal cortical contusion with a small amount of traumatic subarachnoid hemorrhage.  No evidence of significant subdural hemorrhage.  No evidence of edema or mass effect.  Patient is currently awake and alert.  She has minimal headache.  She has no neurologic symptoms.  Denies neck or back pain.  Past Medical History:  Diagnosis Date   Abdominal adhesions 1994   Allergic rhinitis    Anemia    Anxiety and depression    Aortic stenosis    Arthritis    Atrial fibrillation ([place]) 10/2012   Associated with severe anemia and esophageal pill impaction   Breast carcinoma (HCC)    Right mastectomy   Cholelithiasis    Essential hypertension    Gastroesophageal reflux disease    Hiatal hernia   History of blood transfusion    Hypothyroidism    Low back pain    Malabsorption    Short gut syndrome following small bowel resection surgery x2   Nephrolithiasis 2004   Painless hematuria   Short gut syndrome    Bowel resection , 2004   Upper GI bleed 2004   Multiple episodes of melena-? due to gastritis or adverse drug effect (nonsteroidals, small bowel ulceration with Fosamax); caused by Pepto-Bismol during one Emergency Department evaluation    Past Surgical History:  Procedure Laterality Date   ABDOMINAL HYSTERECTOMY     emergency s/p delivery   ABDOMINAL HYSTERECTOMY  1960   massive gynecologic bleeding   BOWEL RESECTION     Resulting short gut syndrome   CHOLECYSTECTOMY N/A 06/25/2018   Procedure: LAPAROSCOPIC CHOLECYSTECTOMY WITH INTRAOPERATIVE CHOLANGIOGRAM;  Surgeon: Armandina Gemma, MD;  Location: [facility];  Service: General;  Laterality: N/A;   COLONOSCOPY W/ POLYPECTOMY  2005   Lipoma;  diverticulosis   COLONOSCOPY WITH ESOPHAGOGASTRODUODENOSCOPY (EGD)  11/22/2012   [facility]   CYSTOSCOPY W/ RETROGRADES Bilateral 04/16/2022   Procedure: CYSTOSCOPY;  Surgeon: Primus Bravo., MD;  Location: [facility];  Service: Urology;  Laterality: Bilateral;   ESOPHAGOGASTRODUODENOSCOPY (EGD) WITH PROPOFOL N/A 04/04/2022   Procedure: ESOPHAGOGASTRODUODENOSCOPY (EGD) WITH PROPOFOL;  Surgeon: Rogene Houston, MD;  Location: [facility];  Service: Endoscopy;  Laterality: N/A;  210   HIP ARTHROPLASTY Right 06/15/2020   Procedure: ANTERIOR ARTHROPLASTY BIPOLAR HIP (HEMIARTHROPLASTY);  Surgeon: Mcarthur Rossetti, MD;  Location: [facility];  Service: Orthopedics;  Laterality: Right;   LAPAROSCOPIC LYSIS OF ADHESIONS  1965   s/p adhesions   MASTECTOMY  right breast   MASTECTOMY     Carcinoma of the breast; right   TRANSURETHRAL RESECTION OF BLADDER TUMOR N/A 04/16/2022   Procedure: TRANSURETHRAL RESECTION OF BLADDER TUMOR (TURBT);  Surgeon: Primus Bravo., MD;  Location: [facility];  Service: Urology;  Laterality: N/A;   UPPER GASTROINTESTINAL ENDOSCOPY      Family History  Problem Relation Age of Onset   Anuerysm Father    Rheum arthritis Sister    Healthy Sister    COPD Sister    Healthy Brother    Cancer Other    Colon cancer Neg Hx     Social History:  reports that she has quit smoking. Her smoking use included cigarettes. She has a 30.00 pack-year smoking history. She has never used smokeless tobacco.  She reports that she does not drink alcohol and does not use drugs.  Allergies:  Allergies  Allergen Reactions   Codeine Nausea And Vomiting    Medications: I have reviewed the patient's current medications.  Results for orders placed or performed during the hospital encounter of 05/13/22 (from the past 48 hour(s))  CBC with Differential     Status: Abnormal   Collection Time: 05/13/22  4:52 PM  Result Value Ref Range   WBC 13.4 (H) 4.0 - 10.5 K/uL   RBC 3.29 (L) 3.87 - 5.11  MIL/uL   Hemoglobin 9.7 (L) 12.0 - 15.0 g/dL   HCT 30.5 (L) 36.0 - 46.0 %   MCV 92.7 80.0 - 100.0 fL   MCH 29.5 26.0 - 34.0 pg   MCHC 31.8 30.0 - 36.0 g/dL   RDW 13.9 11.5 - 15.5 %   Platelets 183 150 - 400 K/uL   nRBC 0.0 0.0 - 0.2 %   Neutrophils Relative % 84 %   Neutro Abs 11.2 (H) 1.7 - 7.7 K/uL   Lymphocytes Relative 7 %   Lymphs Abs 1.0 0.7 - 4.0 K/uL   Monocytes Relative 7 %   Monocytes Absolute 0.9 0.1 - 1.0 K/uL   Eosinophils Relative 1 %   Eosinophils Absolute 0.2 0.0 - 0.5 K/uL   Basophils Relative 0 %   Basophils Absolute 0.0 0.0 - 0.1 K/uL   Immature Granulocytes 1 %   Abs Immature Granulocytes 0.07 0.00 - 0.07 K/uL    Comment: Performed at [facility], [address], [place], [place] [address]  Comprehensive metabolic panel     Status: Abnormal   Collection Time: 05/13/22  4:52 PM  Result Value Ref Range   Sodium 137 135 - 145 mmol/L   Potassium 4.6 3.5 - 5.1 mmol/L   Chloride 108 98 - 111 mmol/L   CO2 22 22 - 32 mmol/L   Glucose, Bld 160 (H) 70 - 99 mg/dL    Comment: Glucose reference range applies only to samples taken after fasting for at least 8 hours.   BUN 29 (H) 8 - 23 mg/dL   Creatinine, Ser 1.73 (H) 0.44 - 1.00 mg/dL   Calcium 9.3 8.9 - 10.3 mg/dL   Total Protein 6.7 6.5 - 8.1 g/dL   Albumin 3.9 3.5 - 5.0 g/dL   AST 20 15 - 41 U/L   ALT 12 0 - 44 U/L   Alkaline Phosphatase 75 38 - 126 U/L   Total Bilirubin 0.3 0.3 - 1.2 mg/dL   GFR, Estimated 27 (L) >60 mL/min    Comment: (NOTE) Calculated using the CKD-EPI Creatinine Equation (2021)    Anion gap 7 5 - 15    Comment: Performed at [facility], [address] [place], [place] [address]  Troponin I (High Sensitivity)     Status: None   Collection Time: 05/13/22  4:52 PM  Result Value Ref Range   Troponin I (High Sensitivity) 8 <18 ng/L    Comment: (NOTE) Elevated high sensitivity troponin I (hsTnI) values and significant  changes across serial measurements may suggest ACS but many other   chronic and acute conditions are known to elevate hsTnI results.  Refer to the "Links" section for chest pain algorithms and additional  guidance. Performed at [facility], [address], [place], [place] [address]   Magnesium     Status: None   Collection Time: 05/13/22  4:52 PM  Result Value Ref Range   Magnesium 1.9 1.7 -  2.4 mg/dL    Comment: Performed at [facility], [address] [place], [place] [address]  TSH     Status: None   Collection Time: 05/13/22  4:52 PM  Result Value Ref Range   TSH 2.788 0.350 - 4.500 uIU/mL    Comment: Performed by a 3rd Generation assay with a functional sensitivity of <=0.01 uIU/mL. Performed at [facility], [address], [place], [place] [address]   MRSA Next Gen by PCR, Nasal     Status: None   Collection Time: 05/13/22 10:46 PM   Specimen: Nasal Mucosa; Nasal Swab  Result Value Ref Range   MRSA by PCR Next Gen NOT DETECTED NOT DETECTED    Comment: (NOTE) The GeneXpert MRSA Assay (FDA approved for NASAL specimens only), is one component of a comprehensive MRSA colonization surveillance program. It is not intended to diagnose MRSA infection nor to guide or monitor treatment for MRSA infections. Test performance is not FDA approved in patients less than 66 years old. Performed at [facility], [place] [address] [place], [place] [address]   CBC     Status: Abnormal   Collection Time: 05/14/22  5:13 AM  Result Value Ref Range   WBC 9.4 4.0 - 10.5 K/uL   RBC 3.17 (L) 3.87 - 5.11 MIL/uL   Hemoglobin 9.4 (L) 12.0 - 15.0 g/dL   HCT 28.3 (L) 36.0 - 46.0 %   MCV 89.3 80.0 - 100.0 fL   MCH 29.7 26.0 - 34.0 pg   MCHC 33.2 30.0 - 36.0 g/dL   RDW 13.6 11.5 - 15.5 %   Platelets 183 150 - 400 K/uL   nRBC 0.0 0.0 - 0.2 %    Comment: Performed at [facility], [place] [address] [place] [address], [place], [place] [address]  Basic metabolic panel     Status: Abnormal   Collection Time: 05/14/22  5:13 AM  Result Value Ref Range   Sodium 137 135 - 145  mmol/L   Potassium 4.2 3.5 - 5.1 mmol/L   Chloride 108 98 - 111 mmol/L   CO2 23 22 - 32 mmol/L   Glucose, Bld 115 (H) 70 - 99 mg/dL    Comment: Glucose reference range applies only to samples taken after fasting for at least 8 hours.   BUN 24 (H) 8 - 23 mg/dL   Creatinine, Ser 1.61 (H) 0.44 - 1.00 mg/dL   Calcium 9.2 8.9 - 10.3 mg/dL   GFR, Estimated 29 (L) >60 mL/min    Comment: (NOTE) Calculated using the CKD-EPI Creatinine Equation (2021)    Anion gap 6 5 - 15    Comment: Performed at [facility] [address] [place], [place] [address]    DG Chest 1 View  Result Date: 05/13/2022 CLINICAL DATA:  Fall EXAM: CHEST  1 VIEW COMPARISON:  02/10/2022 FINDINGS: Lungs are clear.  No pleural effusion or pneumothorax. The heart is normal in size. Surgical clips along the right chest wall/axilla. IMPRESSION: No evidence of acute cardiopulmonary disease. Electronically Signed   By: Julian Hy M.D.   On: 05/13/2022 17:34   DG Pelvis 1-2 Views  Result Date: 05/13/2022 CLINICAL DATA:  Fall EXAM: PELVIS - 1-2 VIEW COMPARISON:  None Available. FINDINGS: Right hip arthroplasty, without evidence of complication. Left hip joint space is preserved. Visualized bony pelvis appears intact. Degenerative changes of the lower lumbar spine. IMPRESSION: Negative. Electronically Signed   By: Julian Hy M.D.   On: 05/13/2022 17:33  DG Shoulder Right  Result Date: 05/13/2022 CLINICAL DATA:  Right arm pain after fall EXAM: RIGHT SHOULDER - 2+ VIEW COMPARISON:  06/14/2020 FINDINGS: Suspected calcific rotator cuff tendinopathy with superior subluxation of the humeral head with respect to the glenoid, but without dislocation. Suspected fracture of the right second rib laterally. IMPRESSION: 1. Suspected fracture of the right second rib laterally. 2. Mildly superiorly subluxed humeral head, which can sometimes be seen in setting of a chronic rotator cuff tear. There is a suggestion of calcific  supraspinatus tendinopathy. No overt dislocation. Electronically Signed   By: Van Clines M.D.   On: 05/13/2022 17:37   CT Head Wo Contrast  Result Date: 05/13/2022 CLINICAL DATA:  Provided history: Head trauma, minor. Additional history provided by scanning technologist: Patient found on floor at home by neighbor. Laceration to back of head. EXAM: CT HEAD WITHOUT CONTRAST TECHNIQUE: Contiguous axial images were obtained from the base of the skull through the vertex without intravenous contrast. RADIATION DOSE REDUCTION: This exam was performed according to the departmental dose-optimization program which includes automated exposure control, adjustment of the mA and/or kV according to patient size and/or use of iterative reconstruction technique. COMPARISON:  Prior head CT examinations 02/10/2022 and earlier. FINDINGS: Brain: Mild generalized cerebral atrophy. Small-volume acute subarachnoid hemorrhage overlying the anterolateral left frontal lobe. Small volume subarachnoid hemorrhage is also present along the anteroinferior frontal lobes, bilaterally. Superimposed hemorrhagic parenchymal contusions at these sites cannot be excluded. Small hemorrhagic parenchymal contusions within the anterior right temporal lobe, measuring up to 9 mm, with overlying small volume acute subarachnoid hemorrhage. Thin acute subdural hemorrhage layering along the left tentorium, measuring up to 3 mm in thickness. Moderate patchy and ill-defined hypoattenuation within the cerebral white matter, nonspecific but compatible with chronic small vessel ischemic disease. No demarcated cortical infarct. No evidence of an intracranial mass. No midline shift. Vascular: No hyperdense vessel. Atherosclerotic calcifications Skull: No fracture or aggressive osseous lesion. Sinuses/Orbits: No mass or acute finding within the imaged orbits. Small osteoma within a right ethmoid air cell. Minimal mucosal thickening within a left ethmoid air  cell. Other: Bilateral mastoid effusions. Right parietooccipital scalp hematoma. These results were called by telephone at the time of interpretation on 05/13/2022 at 6:01 pm to provider Campbell Stall , who verbally acknowledged these results. IMPRESSION: Small-volume acute subarachnoid hemorrhage overlying the anterolateral left frontal lobe. Small-volume acute subarachnoid hemorrhage also present along the anteroinferior frontal lobes, bilaterally. Superimposed hemorrhagic parenchymal contusions at these sites cannot be excluded. Small hemorrhagic parenchymal contusions within the anterior right temporal lobe (measuring up to 9 mm) with overlying small-volume acute subarachnoid hemorrhage. Thin acute subdural hemorrhage along the left tentorium, measuring up to 3 mm in thickness. Right parietooccipital scalp hematoma. Moderate chronic small vessel ischemic changes within the cerebral white matter. Mild generalized cerebral atrophy. Bilateral mastoid effusions. Electronically Signed   By: Kellie Simmering D.O.   On: 05/13/2022 18:02   CT Cervical Spine Wo Contrast  Result Date: 05/13/2022 CLINICAL DATA:  Neck trauma. Additional history provided: Patient found on floor at home by neighbor. Laceration to back of head. EXAM: CT CERVICAL SPINE WITHOUT CONTRAST TECHNIQUE: Multidetector CT imaging of the cervical spine was performed without intravenous contrast. Multiplanar CT image reconstructions were also generated. RADIATION DOSE REDUCTION: This exam was performed according to the departmental dose-optimization program which includes automated exposure control, adjustment of the mA and/or kV according to patient size and/or use of iterative reconstruction technique. COMPARISON:  Radiographs of the cervical spine 08/24/2017.  Cervical spine CT 08/24/2017. FINDINGS: Alignment: Slight cervical dextrocurvature. 2 mm C2-C3 grade 1 anterolisthesis. 4 mm C3-C4 grade 1 retrolisthesis. Skull base and vertebrae: The basion-dental  and atlanto-dental intervals are maintained.No evidence of acute fracture to the cervical spine. Soft tissues and spinal canal: No prevertebral fluid or swelling. No visible canal hematoma. Disc levels: Cervical spondylosis with multilevel disc space narrowing, disc bulges, posterior disc osteophytes, endplate spurring, uncovertebral hypertrophy and facet arthrosis. Disc degeneration is greatest at C3-C4, C4-C5, C5-C6 and C6-C7. Multilevel spinal canal stenosis. Most notably, there is apparent at least moderate spinal canal stenosis at C3-C4. Multilevel bony neural foraminal narrowing. Upper chest: No consolidation within the imaged lung apices. No visible pneumothorax. IMPRESSION: No evidence of acute fracture to the cervical spine. 2 mm grade 1 anterolisthesis at C2-C3. 4 mm grade 1 retrolisthesis at C3-C4. Cervical spondylosis, as described. Multilevel spinal canal stenosis. Notably, there appears to be at least moderate spinal canal stenosis at C3-C4. Multilevel bony neural foraminal narrowing. Disc degeneration is advanced at C3-C4, C4-C5, C5-C6 and C6-C7. Electronically Signed   By: Kellie Simmering D.O.   On: 05/13/2022 18:10   CT Thoracic Spine Wo Contrast  Result Date: 05/13/2022 CLINICAL DATA:  Found down by neighbor. EXAM: CT THORACIC SPINE WITHOUT CONTRAST TECHNIQUE: Multidetector CT images of the thoracic were obtained using the standard protocol without intravenous contrast. RADIATION DOSE REDUCTION: This exam was performed according to the departmental dose-optimization program which includes automated exposure control, adjustment of the mA and/or kV according to patient size and/or use of iterative reconstruction technique. COMPARISON:  PET-CT dated May 01, 2022. Chest x-ray dated February 08, 2022. CT chest dated August 24, 2017. FINDINGS: Alignment: No traumatic malalignment. Vertebrae: No acute fracture or focal pathologic process. Paraspinal and other soft tissues: Old, partially healed  nondisplaced fracture of the posterior left seventh rib. Incompletely visualized right renal staghorn calculus. Disc levels: Mild degenerative disc disease of the mid to lower thoracic spine. No evident high-grade spinal canal or neuroforaminal stenosis. IMPRESSION: 1. No acute osseous abnormality. Electronically Signed   By: Titus Dubin M.D.   On: 05/13/2022 18:02   DG Humerus Right  Result Date: 05/13/2022 CLINICAL DATA:  Trauma, pain EXAM: RIGHT HUMERUS - 2+ VIEW COMPARISON:  None Available. FINDINGS: No recent fracture or dislocation is seen. There is decreased distance between humeral head and acromion. There is widening of right AC joint space measuring 7 mm. There is no increase in coracoclavicular distance. Surgical clips are seen in the right axilla. IMPRESSION: No recent fracture or dislocation is seen in the right humerus. Decreased distance between humeral head and acromion may suggest chronic tear of rotator cuff. There is some widening of right AC joint space, possibly suggesting a normal variation or right AC separation. Electronically Signed   By: Elmer Picker M.D.   On: 05/13/2022 17:34    Pertinent items noted in HPI and remainder of comprehensive ROS otherwise negative. Blood pressure 139/62, pulse 72, temperature (!) 97.3 F (36.3 C), temperature source Axillary, resp. rate 17, height '5\' 1"'$  (1.549 m), weight 54.4 kg, SpO2 97 %. Patient is awake and alert.  She is oriented and appropriate.  Speech is fluent.  Judgment insight appear intact.  Motor examination 5/5 bilateral.  No drift.  Sensory examination nonfocal.  Examination of head demonstrates minimal scalp bruising without evidence of bony abnormality.  Oropharynx, nasopharynx and external auditory canals clear.  Chest and abdomen benign.  Assessment/Plan: Status post fall with a minimal left frontal  contusion with a small amount of traumatic subarachnoid hemorrhage.  Patient may be mobilized ad lib.  No indication for  further imaging unless neurologic status declines.  Mallie Mussel A Ricardo Schubach 05/14/2022, 10:09 AM

## 2022-05-14 NOTE — TOC CAGE-AID Note (Signed)
Transition of Care [facility]) - CAGE-AID Screening   Patient Details  Name: Jane Andrews MRN: [id number] Date of Birth: 1925/01/11  [facility] CM/SW Contact:    Generoso Cropper C Tarpley-Carter, [place] Phone Number: 05/14/2022, 11:21 AM   Clinical Narrative: Pt participated in [place].  Pt stated she does smoke cigarettes.  Pt was offered resources, due to usage of substance.     Jkayla Spiewak Tarpley-Carter, MSW, LCSW-A Pronouns:  She/Her/Hers [facility] Clinical Social Worker Direct Number:  [phone] [email or web address]   CAGE-AID Screening:    Have You Ever Felt You Ought to Cut Down on Your Drinking or Drug Use?: No Have People Annoyed You By [employer] Your Drinking Or Drug Use?: No Have You Felt Bad Or Guilty About Your Drinking Or Drug Use?: No Have You Ever Had a Drink or Used Drugs First Thing In The Morning to Steady Your Nerves or to Get Rid of a Hangover?: No CAGE-AID Score: 0  Substance Abuse Education Offered: Yes  Substance abuse interventions: Scientist, clinical (histocompatibility and immunogenetics)

## 2022-05-14 NOTE — Progress Notes (Signed)
Pt arrived to 4NP from 4NICU. Pt alert and orientedx4, comfortable, with no needs at this time. Pt's daughter at bedside.   Justice Rocher, RN

## 2022-05-14 NOTE — Progress Notes (Signed)
Inpatient Rehab Admissions Coordinator:  ? ?Per therapy recommendations,  patient was screened for CIR candidacy by Keyetta Hollingworth, MS, CCC-SLP. At this time, Pt. Appears to be a a potential candidate for CIR. I will place   order for rehab consult per protocol for full assessment. Please contact me any with questions. ? ?Laprecious Austill, MS, CCC-SLP ?Rehab Admissions Coordinator  ?[phone] (celll) ?[phone] (office) ? ?

## 2022-05-14 NOTE — Evaluation (Addendum)
Speech Language Pathology Evaluation Patient Details Name: Jane Andrews MRN: [id number] DOB: 13-Aug-1925 Today's Date: 05/14/2022 Time: 9833-8250 SLP Time Calculation (min) (ACUTE ONLY): 21 min  Problem List:  Patient Active Problem List   Diagnosis Date Noted   SAH (subarachnoid hemorrhage) ([place]) 05/13/2022   TBI (traumatic brain injury) ([place]) 05/13/2022   Bladder mass 04/16/2022   Hyperkalemia 04/09/2022   Symptomatic anemia 02/11/2022   SIRS (systemic inflammatory response syndrome) ([place]) 02/10/2022   Bilateral external ear infections 02/10/2022   Sinusitis 02/10/2022   Nephrolithiasis 01/07/2022   Vulvar irritation 12/20/2021   Burning with urination 12/20/2021   Symptoms of urinary tract infection 12/20/2021   Diarrhea 03/05/2021   Scoliosis 06/26/2020   Thrombocytopenia ([place]) 06/22/2020   Aspiration pneumonia ([place]) 06/22/2020   Chronic constipation 06/21/2020   Post-menopausal osteoporosis 06/21/2020   Essential tremor 06/21/2020   Vitamin B 12 deficiency 06/21/2020   Major depression, recurrent, chronic ([place]) 06/21/2020   Status post total replacement of right hip    AKI (acute kidney injury) ([place])    Postoperative anemia due to acute blood loss    Closed right hip fracture (HCC) 06/14/2020   IDA (iron deficiency anemia) 02/28/2020   Atrial fibrillation with RVR ([place]) 12/25/2018   GERD (gastroesophageal reflux disease) 12/25/2018   Chronic cholecystitis s/p lap cholecystectomy 06/25/2018 06/18/2018   Vagal reaction 04/12/2016   Abdominal pain 09/19/2014   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   Ecchymoses, spontaneous 05/31/2013   Hypothyroid    Atrial fibrillation ([place])    Breast carcinoma (HCC)    Malabsorption    CKD (chronic kidney disease) stage 4, GFR 15-29 ml/min (HCC) 10/21/2012   Anemia, normocytic normochromic 07/06/2012   Chronic diarrhea 02/10/2012   Hypertension 02/10/2012   Past Medical History:  Past Medical History:  Diagnosis Date    Abdominal adhesions 1994   Allergic rhinitis    Anemia    Anxiety and depression    Aortic stenosis    Arthritis    Atrial fibrillation ([place]) 10/2012   Associated with severe anemia and esophageal pill impaction   Breast carcinoma (HCC)    Right mastectomy   Cholelithiasis    Essential hypertension    Gastroesophageal reflux disease    Hiatal hernia   History of blood transfusion    Hypothyroidism    Low back pain    Malabsorption    Short gut syndrome following small bowel resection surgery x2   Nephrolithiasis 2004   Painless hematuria   Short gut syndrome    Bowel resection , 2004   Upper GI bleed 2004   Multiple episodes of melena-? due to gastritis or adverse drug effect (nonsteroidals, small bowel ulceration with Fosamax); caused by Pepto-Bismol during one Emergency Department evaluation   Past Surgical History:  Past Surgical History:  Procedure Laterality Date   ABDOMINAL HYSTERECTOMY     emergency s/p delivery   ABDOMINAL HYSTERECTOMY  1960   massive gynecologic bleeding   BOWEL RESECTION     Resulting short gut syndrome   CHOLECYSTECTOMY N/A 06/25/2018   Procedure: LAPAROSCOPIC CHOLECYSTECTOMY WITH INTRAOPERATIVE CHOLANGIOGRAM;  Surgeon: Armandina Gemma, MD;  Location: [facility];  Service: General;  Laterality: N/A;   COLONOSCOPY W/ POLYPECTOMY  2005   Lipoma; diverticulosis   COLONOSCOPY WITH ESOPHAGOGASTRODUODENOSCOPY (EGD)  11/22/2012   [facility]   CYSTOSCOPY W/ RETROGRADES Bilateral 04/16/2022   Procedure: CYSTOSCOPY;  Surgeon: Primus Bravo., MD;  Location: [facility];  Service: Urology;  Laterality: Bilateral;   ESOPHAGOGASTRODUODENOSCOPY (EGD) WITH PROPOFOL  N/A 04/04/2022   Procedure: ESOPHAGOGASTRODUODENOSCOPY (EGD) WITH PROPOFOL;  Surgeon: Rogene Houston, MD;  Location: [facility];  Service: Endoscopy;  Laterality: N/A;  210   HIP ARTHROPLASTY Right 06/15/2020   Procedure: ANTERIOR ARTHROPLASTY BIPOLAR HIP (HEMIARTHROPLASTY);  Surgeon: Mcarthur Rossetti, MD;  Location: [facility];  Service: Orthopedics;  Laterality: Right;   LAPAROSCOPIC LYSIS OF ADHESIONS  1965   s/p adhesions   MASTECTOMY  right breast   MASTECTOMY     Carcinoma of the breast; right   TRANSURETHRAL RESECTION OF BLADDER TUMOR N/A 04/16/2022   Procedure: TRANSURETHRAL RESECTION OF BLADDER TUMOR (TURBT);  Surgeon: Primus Bravo., MD;  Location: [facility];  Service: Urology;  Laterality: N/A;   UPPER GASTROINTESTINAL ENDOSCOPY     HPI:  Pt is a 88 y/o female who presented to the ED after fall. CT head: Small-volume acute subarachnoid hemorrhage overlying the anterolateral left frontal lobe also present along the anteroinferior frontal lobes, bilaterally. Superimposed hemorrhagic parenchymal contusions not excluded. Pt also with R 2nd rib fx. PMH includes anxiety, depression, afib, breast CA, HTN, GIB, GERD.   Assessment / Plan / Recommendation Clinical Impression  Pt participated in speech-language-cognition evaluation with her brother and sister-in-law present. All parties reported that the pt lives alone and has been able to manage her medications and finances with minimal assistance from her niece. Pt's alertness was suboptimal and pt's family attributed this to her recent receipt of morphine. [facility] Mental Status Examination was completed to evaluate the pt's cognitive-linguistic skills, but the impact of her lethargy on performance is considered. She achieved a score of 14/30 which is below the normal limits of 27 or more out of 30. She exhibited difficulty in the areas of attention, memory, and executive function. Skilled SLP services are clinically indicated at this time to improve cognitive-linguistic function.    SLP Assessment  SLP Recommendation/Assessment: Patient needs continued Speech [place] [facility] Services SLP Visit Diagnosis: Cognitive communication deficit (R41.841)    Recommendations for follow up therapy are one component of a  multi-disciplinary discharge planning process, led by the attending physician.  Recommendations may be updated based on patient status, additional functional criteria and insurance authorization.    Follow Up Recommendations  Acute inpatient rehab (3hours/day)    Assistance Recommended at Discharge  Intermittent Supervision/Assistance  Functional Status Assessment Patient has had a recent decline in their functional status and demonstrates the ability to make significant improvements in function in a reasonable and predictable amount of time.  Frequency and Duration min 2x/week  2 weeks      SLP Evaluation Cognition  Overall Cognitive Status: Impaired/Different from baseline Arousal/Alertness: Awake/alert Orientation Level: Oriented X4 Year: 2023 Month: May Day of Week: Correct Attention: Focused;Sustained Focused Attention: Appears intact Sustained Attention: Impaired Memory: Impaired Memory Impairment: Decreased short term memory;Storage deficit (immediate: 5/5 with repetition x3; delayed: 0/5; with cues: 5/5) Awareness: Impaired Awareness Impairment: Emergent impairment Problem Solving Impairment: Verbal complex (Money: 1/3; time: 0/1) Executive Function: Sequencing;Organizing Sequencing:  (clock drawaing: 4/4) Sequencing Impairment: Verbal complex Organizing: Impaired Organizing Impairment: Verbal complex (backward digit span: 1/2) [employer] Scales of Cognitive Functioning: Purposeful/appropriate       Comprehension  Auditory Comprehension Overall Auditory Comprehension: Appears within functional limits for tasks assessed Yes/No Questions: Within Functional Limits Commands: Within Functional Limits Conversation: Complex Reading Comprehension Reading Status: Within funtional limits    Expression Expression Primary Mode of Expression: Verbal Verbal Expression Overall Verbal Expression: Appears within functional limits  for tasks assessed Initiation: No  impairment Level of Generative/Spontaneous Verbalization: Conversation Repetition: No impairment Naming: No impairment Pragmatics: No impairment   Oral / Motor  Oral Motor/Sensory Function Overall Oral Motor/Sensory Function: Within functional limits Motor Speech Overall Motor Speech: Appears within functional limits for tasks assessed Respiration: Within functional limits Phonation: Normal Resonance: Within functional limits Intelligibility: Intelligible Motor Planning: Witnin functional limits Motor Speech Errors: Not applicable           Deone Leifheit I. Hardin Negus, [place], [place] Office number [phone] Pager [place] 05/14/2022, 2:31 PM

## 2022-05-14 NOTE — Progress Notes (Signed)
Trauma/Critical Care Follow Up Note  Subjective:    Overnight Issues:   Objective:  Vital signs for last 24 hours: Temp:  [97.3 F (36.3 C)-98.3 F (36.8 C)] 98.3 F (36.8 C) (05/31 1154) Pulse Rate:  [71-94] 75 (05/31 1300) Resp:  [10-18] 13 (05/31 1300) BP: (116-164)/(62-94) 133/67 (05/31 1300) SpO2:  [86 %-100 %] 98 % (05/31 1300) Weight:  [54.4 kg] 54.4 kg (05/30 1643)  Hemodynamic parameters for last 24 hours:    Intake/Output from previous day: 05/30 0701 - 05/31 0700 In: 700.7 [I.V.:600.7; IV Piggyback:100] Out: 700 [Urine:700]  Intake/Output this shift: Total I/O In: 529.6 [I.V.:429.6; IV Piggyback:100] Out: -   Vent settings for last 24 hours:    Physical Exam:  Gen: comfortable, no distress Neuro: non-focal exam HEENT: PERRL Neck: supple CV: RRR Pulm: unlabored breathing Abd: soft, NT GU: clear yellow urine Extr: wwp, no edema   Results for orders placed or performed during the hospital encounter of 05/13/22 (from the past 24 hour(s))  CBC with Differential     Status: Abnormal   Collection Time: 05/13/22  4:52 PM  Result Value Ref Range   WBC 13.4 (H) 4.0 - 10.5 K/uL   RBC 3.29 (L) 3.87 - 5.11 MIL/uL   Hemoglobin 9.7 (L) 12.0 - 15.0 g/dL   HCT 30.5 (L) 36.0 - 46.0 %   MCV 92.7 80.0 - 100.0 fL   MCH 29.5 26.0 - 34.0 pg   MCHC 31.8 30.0 - 36.0 g/dL   RDW 13.9 11.5 - 15.5 %   Platelets 183 150 - 400 K/uL   nRBC 0.0 0.0 - 0.2 %   Neutrophils Relative % 84 %   Neutro Abs 11.2 (H) 1.7 - 7.7 K/uL   Lymphocytes Relative 7 %   Lymphs Abs 1.0 0.7 - 4.0 K/uL   Monocytes Relative 7 %   Monocytes Absolute 0.9 0.1 - 1.0 K/uL   Eosinophils Relative 1 %   Eosinophils Absolute 0.2 0.0 - 0.5 K/uL   Basophils Relative 0 %   Basophils Absolute 0.0 0.0 - 0.1 K/uL   Immature Granulocytes 1 %   Abs Immature Granulocytes 0.07 0.00 - 0.07 K/uL  Comprehensive metabolic panel     Status: Abnormal   Collection Time: 05/13/22  4:52 PM  Result Value Ref Range    Sodium 137 135 - 145 mmol/L   Potassium 4.6 3.5 - 5.1 mmol/L   Chloride 108 98 - 111 mmol/L   CO2 22 22 - 32 mmol/L   Glucose, Bld 160 (H) 70 - 99 mg/dL   BUN 29 (H) 8 - 23 mg/dL   Creatinine, Ser 1.73 (H) 0.44 - 1.00 mg/dL   Calcium 9.3 8.9 - 10.3 mg/dL   Total Protein 6.7 6.5 - 8.1 g/dL   Albumin 3.9 3.5 - 5.0 g/dL   AST 20 15 - 41 U/L   ALT 12 0 - 44 U/L   Alkaline Phosphatase 75 38 - 126 U/L   Total Bilirubin 0.3 0.3 - 1.2 mg/dL   GFR, Estimated 27 (L) >60 mL/min   Anion gap 7 5 - 15  Troponin I (High Sensitivity)     Status: None   Collection Time: 05/13/22  4:52 PM  Result Value Ref Range   Troponin I (High Sensitivity) 8 <18 ng/L  Magnesium     Status: None   Collection Time: 05/13/22  4:52 PM  Result Value Ref Range   Magnesium 1.9 1.7 - 2.4 mg/dL  TSH  Status: None   Collection Time: 05/13/22  4:52 PM  Result Value Ref Range   TSH 2.788 0.350 - 4.500 uIU/mL  MRSA Next Gen by PCR, Nasal     Status: None   Collection Time: 05/13/22 10:46 PM   Specimen: Nasal Mucosa; Nasal Swab  Result Value Ref Range   MRSA by PCR Next Gen NOT DETECTED NOT DETECTED  CBC     Status: Abnormal   Collection Time: 05/14/22  5:13 AM  Result Value Ref Range   WBC 9.4 4.0 - 10.5 K/uL   RBC 3.17 (L) 3.87 - 5.11 MIL/uL   Hemoglobin 9.4 (L) 12.0 - 15.0 g/dL   HCT 28.3 (L) 36.0 - 46.0 %   MCV 89.3 80.0 - 100.0 fL   MCH 29.7 26.0 - 34.0 pg   MCHC 33.2 30.0 - 36.0 g/dL   RDW 13.6 11.5 - 15.5 %   Platelets 183 150 - 400 K/uL   nRBC 0.0 0.0 - 0.2 %  Basic metabolic panel     Status: Abnormal   Collection Time: 05/14/22  5:13 AM  Result Value Ref Range   Sodium 137 135 - 145 mmol/L   Potassium 4.2 3.5 - 5.1 mmol/L   Chloride 108 98 - 111 mmol/L   CO2 23 22 - 32 mmol/L   Glucose, Bld 115 (H) 70 - 99 mg/dL   BUN 24 (H) 8 - 23 mg/dL   Creatinine, Ser 1.61 (H) 0.44 - 1.00 mg/dL   Calcium 9.2 8.9 - 10.3 mg/dL   GFR, Estimated 29 (L) >60 mL/min   Anion gap 6 5 - 15    Assessment &  Plan: The plan of care was discussed with the bedside nurse for the day, who is in agreement with this plan and no additional concerns were raised.   Present on Admission:  TBI (traumatic brain injury) ([place])    LOS: 1 day   Additional comments:I reviewed the patient's new clinical lab test results.   and I reviewed the patients new imaging test results.    GLF   TBI, bilateral frontal subarachnoid hemorrhage, right temporal lobe intracerebral contusion and a left tentorium subdural - NSGY c/s, no repeat head CT, keppra x7d, monitor clinical exam, SLP eval R 2nd rib FX - pain control, IS/pulm toilet H/o newly dx bladder cancer - scheduled for nephro appt 6/8 and onc appt 6/9 with Dr. Tammi Klippel. Dr. Tammi Klippel notified of admission and possible scheduling conflict. FEN - CLD, okay for regular DVT - SCDs, LMWH to start 6/2 AM Dispo - [place], MD Trauma & General Surgery Please use [email or web address] to contact on call provider  05/14/2022  *Care during the described time interval was provided by me. I have reviewed this patient's available data, including medical history, events of note, physical examination and test results as part of my evaluation.

## 2022-05-14 NOTE — Evaluation (Signed)
Physical Therapy Evaluation Patient Details Name: Jane Andrews MRN: [id number] DOB: 05-14-1925 Today's Date: 05/14/2022  History of Present Illness  88 y.o. female presents to [facility] hospital on 05/13/2022 after sustaining a fall. 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 Pt also with R 2nd rib fx. PMH includes anxiety, depression, afib, breast CA, HTN, GIB, GERD.  Clinical Impression  Pt presents to PT with deficits in balance, strength, power, sensation, gait. Pt currently requires physical assistance to perform all functional mobility tasks. Pt reports feeling unwell, with tingling in all extremities. Pt's niece contributes the pt's slowed processing and tingling to morphine which the pt received. Pt will benefit from continued aggressive mobilization in an effort to improve balance and reduce falls risk. PT recommends AIR placement at this time.       Recommendations for follow up therapy are one component of a multi-disciplinary discharge planning process, led by the attending physician.  Recommendations may be updated based on patient status, additional functional criteria and insurance authorization.  Follow Up Recommendations Acute inpatient rehab (3hours/day)    Assistance Recommended at Discharge Intermittent Supervision/Assistance  Patient can return home with the following  A lot of help with walking and/or transfers;A lot of help with bathing/dressing/bathroom;Assistance with cooking/housework;Direct supervision/assist for medications management;Direct supervision/assist for financial management;Assist for transportation;Help with stairs or ramp for entrance    Equipment Recommendations Rolling walker (2 wheels);BSC/3in1  Recommendations for Other Services  Rehab consult    Functional Status Assessment Patient has had a recent decline in their functional status and demonstrates the ability to make significant  improvements in function in a reasonable and predictable amount of time.     Precautions / Restrictions Precautions Precautions: Fall Restrictions Weight Bearing Restrictions: No Other Position/Activity Restrictions: 2nd rib fx      Mobility  Bed Mobility Overal bed mobility: Needs Assistance Bed Mobility: Supine to Sit, Sit to Supine     Supine to sit: Mod assist, +2 for physical assistance Sit to supine: Mod assist, +2 for physical assistance   General bed mobility comments: assist to scoot hips toward edge of bed, UE support to pull trunk into upright    Transfers Overall transfer level: Needs assistance Equipment used: 2 person hand held assist Transfers: Sit to/from Stand Sit to Stand: Min assist, +2 physical assistance           General transfer comment: posterior lean, initial knee buckling    Ambulation/Gait                  Stairs            Wheelchair Mobility    Modified Rankin (Stroke Patients Only)       Balance Overall balance assessment: Needs assistance Sitting-balance support: No upper extremity supported, Feet supported Sitting balance-Leahy Scale: Poor Sitting balance - Comments: minG, brief periods of minA Postural control: Left lateral lean Standing balance support: Bilateral upper extremity supported Standing balance-Leahy Scale: Poor Standing balance comment: minA x2, posterior and left lean                             Pertinent Vitals/Pain Pain Assessment Pain Assessment: Faces Faces Pain Scale: Hurts a little bit Pain Location: head Pain Descriptors / Indicators: Headache Pain Intervention(s): Limited activity within patient's tolerance    Home Living Family/patient expects to be discharged to:: Private residence Living Arrangements: Alone Available Help  at Discharge: Family;Available 24 hours/day Type of Home: House Home Access: Stairs to enter Entrance Stairs-Rails: Right;Left Entrance  Stairs-Number of Steps: 3 Alternate Level Stairs-Number of Steps: 20 Home Layout: Two level Home Equipment: Conservation officer, nature (2 wheels);Cane - single point;Shower seat;Grab bars - tub/shower      Prior Function Prior Level of Function : Independent/Modified Independent;Driving             Mobility Comments: household and limited community ambulation without AD ADLs Comments: independent     Hand Dominance   Dominant Hand: Right    Extremity/Trunk Assessment   Upper Extremity Assessment Upper Extremity Assessment: Generalized weakness;RUE deficits/detail;LUE deficits/detail RUE Sensation: decreased light touch (tingling) LUE Sensation: decreased light touch (tingling)    Lower Extremity Assessment Lower Extremity Assessment: Generalized weakness;RLE deficits/detail;LLE deficits/detail RLE Sensation: decreased light touch (tingling) LLE Sensation: decreased light touch (tingling)    Cervical / Trunk Assessment Cervical / Trunk Assessment: Kyphotic  Communication   Communication: No difficulties  Cognition Arousal/Alertness: Awake/alert Behavior During Therapy: Flat affect Overall Cognitive Status: Impaired/Different from baseline Area of Impairment: Orientation, Attention, Memory, Following commands, Safety/judgement, Awareness, Problem solving                 Orientation Level: Disoriented to, Situation (unaware of extent of injuries) Current Attention Level: Sustained Memory: Decreased recall of precautions, Decreased short-term memory Following Commands: Follows one step commands with increased time Safety/Judgement: Decreased awareness of safety, Decreased awareness of deficits Awareness: Emergent Problem Solving: Slow processing, Decreased initiation, Requires verbal cues          General Comments General comments (skin integrity, edema, etc.): VSS on RA, pt reports dizziness with mobility, pursuits WFL, no nystagmus noted. Pt reports head turns  increase symptoms    Exercises     Assessment/Plan    PT Assessment Patient needs continued PT services  PT Problem List Decreased strength;Decreased activity tolerance;Decreased balance;Decreased mobility;Decreased cognition;Decreased knowledge of use of DME;Decreased safety awareness;Decreased knowledge of precautions;Impaired sensation       PT Treatment Interventions DME instruction;Gait training;Stair training;Functional mobility training;Therapeutic activities;Therapeutic exercise;Balance training;Patient/family education;Neuromuscular re-education    PT Goals (Current goals can be found in the Care Plan section)  Acute Rehab PT Goals Patient Stated Goal: to return to independence PT Goal Formulation: With patient Time For Goal Achievement: 05/28/22 Potential to Achieve Goals: Good    Frequency Min 3X/week     Co-evaluation PT/OT/SLP Co-Evaluation/Treatment: Yes Reason for Co-Treatment: Complexity of the patient's impairments (multi-system involvement);Necessary to address cognition/behavior during functional activity;For patient/therapist safety;To address functional/ADL transfers PT goals addressed during session: Mobility/safety with mobility;Balance;Strengthening/ROM         AM-PAC PT "6 Clicks" Mobility  Outcome Measure Help needed turning from your back to your side while in a flat bed without using bedrails?: A Lot Help needed moving from lying on your back to sitting on the side of a flat bed without using bedrails?: A Lot Help needed moving to and from a bed to a chair (including a wheelchair)?: A Lot Help needed standing up from a chair using your arms (e.g., wheelchair or bedside chair)?: A Lot Help needed to walk in hospital room?: Total Help needed climbing 3-5 steps with a railing? : Total 6 Click Score: 10    End of Session   Activity Tolerance: Patient tolerated treatment well Patient left: in bed;with call bell/phone within reach;with bed alarm  set Nurse Communication: Mobility status PT Visit Diagnosis: Other abnormalities of gait and mobility (R26.89);Other symptoms and signs  involving the nervous system (R29.898)    Time: 8757-9728 PT Time Calculation (min) (ACUTE ONLY): 31 min   Charges:   PT Evaluation $PT Eval Low Complexity: [place], PT, DPT Acute Rehabilitation Pager: [phone] Office [phone]   Zenaida Niece 05/14/2022, 12:21 PM

## 2022-05-15 ENCOUNTER — Inpatient Hospital Stay (HOSPITAL_COMMUNITY): Payer: Medicare Other

## 2022-05-15 ENCOUNTER — Encounter (HOSPITAL_COMMUNITY): Payer: Self-pay | Admitting: General Surgery

## 2022-05-15 DIAGNOSIS — W19XXXA Unspecified fall, initial encounter: Secondary | ICD-10-CM | POA: Diagnosis not present

## 2022-05-15 DIAGNOSIS — I609 Nontraumatic subarachnoid hemorrhage, unspecified: Secondary | ICD-10-CM

## 2022-05-15 DIAGNOSIS — Z7189 Other specified counseling: Secondary | ICD-10-CM | POA: Diagnosis not present

## 2022-05-15 DIAGNOSIS — Z515 Encounter for palliative care: Secondary | ICD-10-CM | POA: Diagnosis not present

## 2022-05-15 LAB — BASIC METABOLIC PANEL
Anion gap: 6 (ref 5–15)
BUN: 23 mg/dL (ref 8–23)
CO2: 23 mmol/L (ref 22–32)
Calcium: 8.9 mg/dL (ref 8.9–10.3)
Chloride: 109 mmol/L (ref 98–111)
Creatinine, Ser: 1.73 mg/dL — ABNORMAL HIGH (ref 0.44–1.00)
GFR, Estimated: 27 mL/min — ABNORMAL LOW (ref >60)
Glucose, Bld: 102 mg/dL — ABNORMAL HIGH (ref 70–99)
Potassium: 4.3 mmol/L (ref 3.5–5.1)
Sodium: 138 mmol/L (ref 135–145)

## 2022-05-15 NOTE — Progress Notes (Signed)
Physical Therapy Treatment Patient Details Name: Jane Andrews MRN: [id number] DOB: [birth year] Today's Date: 05/15/2022   History of Present Illness 88 y.o. female presents to [facility] hospital on 05/13/2022 after sustaining a fall. 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 Pt also with R 2nd rib fx. PMH includes anxiety, depression, afib, breast CA, HTN, GIB, GERD.    PT Comments    Pt tolerates treatment well, progressing to multiple out of bed transfers and initiation of gait. Pt with persistent left lateral lean and ataxic movements noted in all extremities, with L side more significant than R. Pt remains at a high risk for falls due to imbalance and ataxia. PT recommends AIR admission. Acute PT to follow.   Recommendations for follow up therapy are one component of a multi-disciplinary discharge planning process, led by the attending physician.  Recommendations may be updated based on patient status, additional functional criteria and insurance authorization.  Follow Up Recommendations  Acute inpatient rehab (3hours/day)     Assistance Recommended at Discharge Intermittent Supervision/Assistance  Patient can return home with the following A lot of help with walking and/or transfers;A lot of help with bathing/dressing/bathroom;Assistance with cooking/housework;Direct supervision/assist for medications management;Direct supervision/assist for financial management;Assist for transportation;Help with stairs or ramp for entrance   Equipment Recommendations  Rolling walker (2 wheels);BSC/3in1    Recommendations for Other Services       Precautions / Restrictions Precautions Precautions: Fall Restrictions Weight Bearing Restrictions: No Other Position/Activity Restrictions: 2nd rib fx     Mobility  Bed Mobility Overal bed mobility: Needs Assistance Bed Mobility: Supine to Sit     Supine to sit: Min assist, HOB  elevated     General bed mobility comments: assist to scoot hips to edge of bed    Transfers Overall transfer level: Needs assistance Equipment used: Rolling walker (2 wheels), 1 person hand held assist Transfers: Sit to/from Stand, Bed to chair/wheelchair/BSC Sit to Stand: Mod assist Stand pivot transfers: Max assist Step pivot transfers: Max assist       General transfer comment: pt with ataxic movements of BLE, moreso with LLE. LLE tending to slide to midline with left lateral lean    Ambulation/Gait Ambulation/Gait assistance: Max assist Gait Distance (Feet): 4 Feet Assistive device: Rolling walker (2 wheels) Gait Pattern/deviations: Step-to pattern, Ataxic Gait velocity: reduced Gait velocity interpretation: <1.31 ft/sec, indicative of household ambulator   General Gait Details: pt with left lateral lean, ataxic movement with BLE   Stairs             Wheelchair Mobility    Modified Rankin (Stroke Patients Only)       Balance Overall balance assessment: Needs assistance Sitting-balance support: Single extremity supported, Bilateral upper extremity supported, Feet supported Sitting balance-Leahy Scale: Poor Sitting balance - Comments: minA, left lateral and posterior lean   Standing balance support: Bilateral upper extremity supported, Reliant on assistive device for balance Standing balance-Leahy Scale: Poor Standing balance comment: min-modA, left lateral lean                            Cognition Arousal/Alertness: Awake/alert Behavior During Therapy: Anxious Overall Cognitive Status: Impaired/Different from baseline Area of Impairment: Memory, Safety/judgement, Awareness, Problem solving               Rancho Levels of Cognitive Functioning Rancho Los Amigos Scales of Cognitive Functioning: Purposeful/appropriate     Memory: Decreased  short-term memory   Safety/Judgement: Decreased awareness of deficits Awareness:  Emergent Problem Solving: Requires verbal cues, Requires tactile cues     Rancho [employer] Scales of Cognitive Functioning: Purposeful/appropriate    Exercises      General Comments General comments (skin integrity, edema, etc.): VSS on RA, pt reports lightheadedness, BP stable      Pertinent Vitals/Pain Pain Assessment Pain Assessment: No/denies pain    Home Living                          Prior Function            PT Goals (current goals can now be found in the care plan section) Acute Rehab PT Goals Patient Stated Goal: to return to independence Progress towards PT goals: Progressing toward goals    Frequency    Min 3X/week      PT Plan Current plan remains appropriate    Co-evaluation              AM-PAC PT "6 Clicks" Mobility   Outcome Measure  Help needed turning from your back to your side while in a flat bed without using bedrails?: A Little Help needed moving from lying on your back to sitting on the side of a flat bed without using bedrails?: A Little Help needed moving to and from a bed to a chair (including a wheelchair)?: A Lot Help needed standing up from a chair using your arms (e.g., wheelchair or bedside chair)?: A Lot Help needed to walk in hospital room?: Total Help needed climbing 3-5 steps with a railing? : Total 6 Click Score: 12    End of Session   Activity Tolerance: Patient tolerated treatment well Patient left: in chair;with call bell/phone within reach;with chair alarm set Nurse Communication: Mobility status;Need for lift equipment (STEDY) PT Visit Diagnosis: Other abnormalities of gait and mobility (R26.89);Other symptoms and signs involving the nervous system (R29.898)     Time: 1127-1205 PT Time Calculation (min) (ACUTE ONLY): 38 min  Charges:  $Gait Training: 8-22 mins $Therapeutic Activity: 23-37 mins                     Zenaida Niece, PT, DPT Acute Rehabilitation Pager: [phone] Office  Thornton Opie Maclaughlin 05/15/2022, 12:24 PM

## 2022-05-15 NOTE — Progress Notes (Signed)
IP rehab admissions - I met with patient at the bedside.  She lives alone.  She does have a brother and a niece, but she says she does not want to burden them.  She feels she may need to go to a SNF since she does not have 24/7 caregiver support.  I will follow up with her again tomorrow.  Call for questions.  [phone]

## 2022-05-15 NOTE — Progress Notes (Signed)
[facility] Progress Note     Subjective: CC:  Patient denies pain. Was having HA that is currently resolved. Has nausea and occasional emesis with movement. Denies chest pain. Voiding without reported sxs - she is on a medication for bladder CA that causes green urine. +flatus and BM. Pt reports numbness of her hands and feet that were not present before the accident.   Niece and POA at bedside, states she sees and talks to the patient multiple times weekly. Patient is independent of ADLs, lives alone, drives. Niece expresses great concern about patients lower extremity and upper extremity weakness. She also tells me that the patient was diagnosed with a mini stroke around her R orbit sometime not too long ago.  Objective: Vital signs in last 24 hours: Temp:  [97.9 F (36.6 C)-98.8 F (37.1 C)] 97.9 F (36.6 C) (06/01 0726) Pulse Rate:  [67-88] 67 (06/01 0726) Resp:  [12-20] 13 (06/01 0726) BP: (109-139)/(58-67) 138/66 (06/01 0726) SpO2:  [94 %-98 %] 95 % (06/01 0726) Last BM Date :  (PTA)  Intake/Output from previous day: 05/31 0701 - 06/01 0700 In: 724.5 [P.O.:120; I.V.:504.5; IV Piggyback:100] Out: 1150 [Urine:1150] Intake/Output this shift: No intake/output data recorded.  PE: Gen:  Alert, NAD, pleasant and cooperative Card:  Regular rate and rhythm, pedal pulses 2+ BL, no pedal edema  Pulm:  Normal effort, clear to auscultation bilaterally Abd: Soft, non-tender, non-distended, bowel sounds present in all 4 quadrants, no HSM Skin: warm and dry, no rashes; dressings over medial and lateral ankles bilaterally.  Psych: A&Ox4 Neuro: no focal deficits. No facial drorp. Decreased grip strength RUE 4/5, expresses numbness of her R hand and both feet - no tingling. Finger to nose test done -- normal on left. Right finger touched upper lip.   Lab Results:  Recent Labs    05/13/22 1652 05/14/22 0513  WBC 13.4* 9.4  HGB 9.7* 9.4*  HCT 30.5* 28.3*  PLT 183 183    BMET Recent Labs    05/14/22 0513 05/15/22 0834  NA 137 138  K 4.2 4.3  CL 108 109  CO2 23 23  GLUCOSE 115* 102*  BUN 24* 23  CREATININE 1.61* 1.73*  CALCIUM 9.2 8.9   PT/INR No results for input(s): LABPROT, INR in the last 72 hours. CMP     Component Value Date/Time   NA 138 05/15/2022 0834   NA 141 04/23/2022 1243   K 4.3 05/15/2022 0834   CL 109 05/15/2022 0834   CO2 23 05/15/2022 0834   GLUCOSE 102 (H) 05/15/2022 0834   BUN 23 05/15/2022 0834   BUN 37 (H) 04/23/2022 1243   CREATININE 1.73 (H) 05/15/2022 0834   CREATININE 1.31 (H) 09/23/2018 0952   CALCIUM 8.9 05/15/2022 0834   PROT 6.7 05/13/2022 1652   PROT 6.7 04/23/2022 1243   ALBUMIN 3.9 05/13/2022 1652   ALBUMIN 4.3 04/23/2022 1243   AST 20 05/13/2022 1652   ALT 12 05/13/2022 1652   ALKPHOS 75 05/13/2022 1652   BILITOT 0.3 05/13/2022 1652   BILITOT 0.2 04/23/2022 1243   GFRNONAA 27 (L) 05/15/2022 0834   GFRAA 37 (L) 08/30/2020 1255   Lipase     Component Value Date/Time   LIPASE 45 03/01/2018 1348       Studies/Results: DG Chest 1 View  Result Date: 05/13/2022 CLINICAL DATA:  Fall EXAM: CHEST  1 VIEW COMPARISON:  02/10/2022 FINDINGS: Lungs are clear.  No pleural effusion or pneumothorax. The heart is normal in  size. Surgical clips along the right chest wall/axilla. IMPRESSION: No evidence of acute cardiopulmonary disease. Electronically Signed   By: Julian Hy M.D.   On: 05/13/2022 17:34   DG Pelvis 1-2 Views  Result Date: 05/13/2022 CLINICAL DATA:  Fall EXAM: PELVIS - 1-2 VIEW COMPARISON:  None Available. FINDINGS: Right hip arthroplasty, without evidence of complication. Left hip joint space is preserved. Visualized bony pelvis appears intact. Degenerative changes of the lower lumbar spine. IMPRESSION: Negative. Electronically Signed   By: Julian Hy M.D.   On: 05/13/2022 17:33   DG Shoulder Right  Result Date: 05/13/2022 CLINICAL DATA:  Right arm pain after fall EXAM: RIGHT  SHOULDER - 2+ VIEW COMPARISON:  06/14/2020 FINDINGS: Suspected calcific rotator cuff tendinopathy with superior subluxation of the humeral head with respect to the glenoid, but without dislocation. Suspected fracture of the right second rib laterally. IMPRESSION: 1. Suspected fracture of the right second rib laterally. 2. Mildly superiorly subluxed humeral head, which can sometimes be seen in setting of a chronic rotator cuff tear. There is a suggestion of calcific supraspinatus tendinopathy. No overt dislocation. Electronically Signed   By: Van Clines M.D.   On: 05/13/2022 17:37   CT Head Wo Contrast  Result Date: 05/13/2022 CLINICAL DATA:  Provided history: Head trauma, minor. Additional history provided by scanning technologist: Patient found on floor at home by neighbor. Laceration to back of head. EXAM: CT HEAD WITHOUT CONTRAST TECHNIQUE: Contiguous axial images were obtained from the base of the skull through the vertex without intravenous contrast. RADIATION DOSE REDUCTION: This exam was performed according to the departmental dose-optimization program which includes automated exposure control, adjustment of the mA and/or kV according to patient size and/or use of iterative reconstruction technique. COMPARISON:  Prior head CT examinations 02/10/2022 and earlier. FINDINGS: Brain: Mild generalized cerebral atrophy. Small-volume acute subarachnoid hemorrhage overlying the anterolateral left frontal lobe. Small volume subarachnoid hemorrhage is also present along the anteroinferior frontal lobes, bilaterally. Superimposed hemorrhagic parenchymal contusions at these sites cannot be excluded. Small hemorrhagic parenchymal contusions within the anterior right temporal lobe, measuring up to 9 mm, with overlying small volume acute subarachnoid hemorrhage. Thin acute subdural hemorrhage layering along the left tentorium, measuring up to 3 mm in thickness. Moderate patchy and ill-defined hypoattenuation  within the cerebral white matter, nonspecific but compatible with chronic small vessel ischemic disease. No demarcated cortical infarct. No evidence of an intracranial mass. No midline shift. Vascular: No hyperdense vessel. Atherosclerotic calcifications Skull: No fracture or aggressive osseous lesion. Sinuses/Orbits: No mass or acute finding within the imaged orbits. Small osteoma within a right ethmoid air cell. Minimal mucosal thickening within a left ethmoid air cell. Other: Bilateral mastoid effusions. Right parietooccipital scalp hematoma. These results were called by telephone at the time of interpretation on 05/13/2022 at 6:01 pm to provider Campbell Stall , who verbally acknowledged these results. IMPRESSION: Small-volume acute subarachnoid hemorrhage overlying the anterolateral left frontal lobe. Small-volume acute subarachnoid hemorrhage also present along the anteroinferior frontal lobes, bilaterally. Superimposed hemorrhagic parenchymal contusions at these sites cannot be excluded. Small hemorrhagic parenchymal contusions within the anterior right temporal lobe (measuring up to 9 mm) with overlying small-volume acute subarachnoid hemorrhage. Thin acute subdural hemorrhage along the left tentorium, measuring up to 3 mm in thickness. Right parietooccipital scalp hematoma. Moderate chronic small vessel ischemic changes within the cerebral white matter. Mild generalized cerebral atrophy. Bilateral mastoid effusions. Electronically Signed   By: Kellie Simmering D.O.   On: 05/13/2022 18:02   CT Cervical  Spine Wo Contrast  Result Date: 05/13/2022 CLINICAL DATA:  Neck trauma. Additional history provided: Patient found on floor at home by neighbor. Laceration to back of head. EXAM: CT CERVICAL SPINE WITHOUT CONTRAST TECHNIQUE: Multidetector CT imaging of the cervical spine was performed without intravenous contrast. Multiplanar CT image reconstructions were also generated. RADIATION DOSE REDUCTION: This exam was  performed according to the departmental dose-optimization program which includes automated exposure control, adjustment of the mA and/or kV according to patient size and/or use of iterative reconstruction technique. COMPARISON:  Radiographs of the cervical spine 08/24/2017. Cervical spine CT 08/24/2017. FINDINGS: Alignment: Slight cervical dextrocurvature. 2 mm C2-C3 grade 1 anterolisthesis. 4 mm C3-C4 grade 1 retrolisthesis. Skull base and vertebrae: The basion-dental and atlanto-dental intervals are maintained.No evidence of acute fracture to the cervical spine. Soft tissues and spinal canal: No prevertebral fluid or swelling. No visible canal hematoma. Disc levels: Cervical spondylosis with multilevel disc space narrowing, disc bulges, posterior disc osteophytes, endplate spurring, uncovertebral hypertrophy and facet arthrosis. Disc degeneration is greatest at C3-C4, C4-C5, C5-C6 and C6-C7. Multilevel spinal canal stenosis. Most notably, there is apparent at least moderate spinal canal stenosis at C3-C4. Multilevel bony neural foraminal narrowing. Upper chest: No consolidation within the imaged lung apices. No visible pneumothorax. IMPRESSION: No evidence of acute fracture to the cervical spine. 2 mm grade 1 anterolisthesis at C2-C3. 4 mm grade 1 retrolisthesis at C3-C4. Cervical spondylosis, as described. Multilevel spinal canal stenosis. Notably, there appears to be at least moderate spinal canal stenosis at C3-C4. Multilevel bony neural foraminal narrowing. Disc degeneration is advanced at C3-C4, C4-C5, C5-C6 and C6-C7. Electronically Signed   By: Kellie Simmering D.O.   On: 05/13/2022 18:10   CT Thoracic Spine Wo Contrast  Result Date: 05/13/2022 CLINICAL DATA:  Found down by neighbor. EXAM: CT THORACIC SPINE WITHOUT CONTRAST TECHNIQUE: Multidetector CT images of the thoracic were obtained using the standard protocol without intravenous contrast. RADIATION DOSE REDUCTION: This exam was performed according to  the departmental dose-optimization program which includes automated exposure control, adjustment of the mA and/or kV according to patient size and/or use of iterative reconstruction technique. COMPARISON:  PET-CT dated May 01, 2022. Chest x-ray dated February 08, 2022. CT chest dated August 24, 2017. FINDINGS: Alignment: No traumatic malalignment. Vertebrae: No acute fracture or focal pathologic process. Paraspinal and other soft tissues: Old, partially healed nondisplaced fracture of the posterior left seventh rib. Incompletely visualized right renal staghorn calculus. Disc levels: Mild degenerative disc disease of the mid to lower thoracic spine. No evident high-grade spinal canal or neuroforaminal stenosis. IMPRESSION: 1. No acute osseous abnormality. Electronically Signed   By: Titus Dubin M.D.   On: 05/13/2022 18:02   DG Humerus Right  Result Date: 05/13/2022 CLINICAL DATA:  Trauma, pain EXAM: RIGHT HUMERUS - 2+ VIEW COMPARISON:  None Available. FINDINGS: No recent fracture or dislocation is seen. There is decreased distance between humeral head and acromion. There is widening of right AC joint space measuring 7 mm. There is no increase in coracoclavicular distance. Surgical clips are seen in the right axilla. IMPRESSION: No recent fracture or dislocation is seen in the right humerus. Decreased distance between humeral head and acromion may suggest chronic tear of rotator cuff. There is some widening of right AC joint space, possibly suggesting a normal variation or right AC separation. Electronically Signed   By: Elmer Picker M.D.   On: 05/13/2022 17:34    Anti-infectives: Anti-infectives (From admission, onward)    None  Assessment/Plan    GLF   TBI, bilateral frontal subarachnoid hemorrhage, right temporal lobe intracerebral contusion and a left tentorium subdural - NSGY c/s, no repeat head CT, keppra x7d, monitor clinical exam, SLP eval recommending CIR. Given numbness in  hands and feet I have asked NS to evaluate her -- her CT C spine did not suggest acute c-spine injury. Appreciate NS eval. R 2nd rib FX - pain control, IS/pulm toilet H/o newly dx bladder cancer - scheduled for nephro appt 6/8 and onc appt 6/9 with Dr. Tammi Klippel. Dr. Tammi Klippel notified of admission and possible scheduling conflict. FEN - Reg DVT - SCDs, LMWH to start 6/2 AM if ok with NS Dispo - 4NP    LOS: 2 days   I reviewed nursing notes, Consultant NS notes, last 24 h vitals and pain scores, last 48 h intake and output, last 24 h labs and trends, and last 24 h imaging results.    Obie Dredge, PA-C [facility] Please see Amion for pager number during day hours 7:00am-4:30pm

## 2022-05-15 NOTE — Progress Notes (Addendum)
Providing Compassionate, Quality Care - Together   Subjective: Patient reports numbness and tingling in bilateral hands and feet. Report her hands "feel like sand."  Objective: Vital signs in last 24 hours: Temp:  [97.9 F (36.6 C)-98.8 F (37.1 C)] 97.9 F (36.6 C) (06/01 0726) Pulse Rate:  [67-88] 67 (06/01 0726) Resp:  [12-20] 13 (06/01 0726) BP: (109-138)/(58-67) 138/66 (06/01 0726) SpO2:  [94 %-98 %] 95 % (06/01 0726)  Intake/Output from previous day: 05/31 0701 - 06/01 0700 In: 724.5 [P.O.:120; I.V.:504.5; IV Piggyback:100] Out: 1150 [Urine:1150] Intake/Output this shift: No intake/output data recorded.  Alert and oriented x 4 PERRLA Speech clear CN II-XII grossly intact MAE, Generalized weakness; 4/5 grips Decreased light touch sensation in hands and feet   Lab Results: Recent Labs    05/13/22 1652 05/14/22 0513  WBC 13.4* 9.4  HGB 9.7* 9.4*  HCT 30.5* 28.3*  PLT 183 183   BMET Recent Labs    05/14/22 0513 05/15/22 0834  NA 137 138  K 4.2 4.3  CL 108 109  CO2 23 23  GLUCOSE 115* 102*  BUN 24* 23  CREATININE 1.61* 1.73*  CALCIUM 9.2 8.9    Studies/Results: DG Chest 1 View  Result Date: 05/13/2022 CLINICAL DATA:  Fall EXAM: CHEST  1 VIEW COMPARISON:  02/10/2022 FINDINGS: Lungs are clear.  No pleural effusion or pneumothorax. The heart is normal in size. Surgical clips along the right chest wall/axilla. IMPRESSION: No evidence of acute cardiopulmonary disease. Electronically Signed   By: Julian Hy M.D.   On: 05/13/2022 17:34   DG Pelvis 1-2 Views  Result Date: 05/13/2022 CLINICAL DATA:  Fall EXAM: PELVIS - 1-2 VIEW COMPARISON:  None Available. FINDINGS: Right hip arthroplasty, without evidence of complication. Left hip joint space is preserved. Visualized bony pelvis appears intact. Degenerative changes of the lower lumbar spine. IMPRESSION: Negative. Electronically Signed   By: Julian Hy M.D.   On: 05/13/2022 17:33   DG  Shoulder Right  Result Date: 05/13/2022 CLINICAL DATA:  Right arm pain after fall EXAM: RIGHT SHOULDER - 2+ VIEW COMPARISON:  06/14/2020 FINDINGS: Suspected calcific rotator cuff tendinopathy with superior subluxation of the humeral head with respect to the glenoid, but without dislocation. Suspected fracture of the right second rib laterally. IMPRESSION: 1. Suspected fracture of the right second rib laterally. 2. Mildly superiorly subluxed humeral head, which can sometimes be seen in setting of a chronic rotator cuff tear. There is a suggestion of calcific supraspinatus tendinopathy. No overt dislocation. Electronically Signed   By: Van Clines M.D.   On: 05/13/2022 17:37   CT Head Wo Contrast  Result Date: 05/13/2022 CLINICAL DATA:  Provided history: Head trauma, minor. Additional history provided by scanning technologist: Patient found on floor at home by neighbor. Laceration to back of head. EXAM: CT HEAD WITHOUT CONTRAST TECHNIQUE: Contiguous axial images were obtained from the base of the skull through the vertex without intravenous contrast. RADIATION DOSE REDUCTION: This exam was performed according to the departmental dose-optimization program which includes automated exposure control, adjustment of the mA and/or kV according to patient size and/or use of iterative reconstruction technique. COMPARISON:  Prior head CT examinations 02/10/2022 and earlier. FINDINGS: Brain: Mild generalized cerebral atrophy. Small-volume acute subarachnoid hemorrhage overlying the anterolateral left frontal lobe. Small volume subarachnoid hemorrhage is also present along the anteroinferior frontal lobes, bilaterally. Superimposed hemorrhagic parenchymal contusions at these sites cannot be excluded. Small hemorrhagic parenchymal contusions within the anterior right temporal lobe, measuring up to 9  mm, with overlying small volume acute subarachnoid hemorrhage. Thin acute subdural hemorrhage layering along the left  tentorium, measuring up to 3 mm in thickness. Moderate patchy and ill-defined hypoattenuation within the cerebral white matter, nonspecific but compatible with chronic small vessel ischemic disease. No demarcated cortical infarct. No evidence of an intracranial mass. No midline shift. Vascular: No hyperdense vessel. Atherosclerotic calcifications Skull: No fracture or aggressive osseous lesion. Sinuses/Orbits: No mass or acute finding within the imaged orbits. Small osteoma within a right ethmoid air cell. Minimal mucosal thickening within a left ethmoid air cell. Other: Bilateral mastoid effusions. Right parietooccipital scalp hematoma. These results were called by telephone at the time of interpretation on 05/13/2022 at 6:01 pm to provider Campbell Stall , who verbally acknowledged these results. IMPRESSION: Small-volume acute subarachnoid hemorrhage overlying the anterolateral left frontal lobe. Small-volume acute subarachnoid hemorrhage also present along the anteroinferior frontal lobes, bilaterally. Superimposed hemorrhagic parenchymal contusions at these sites cannot be excluded. Small hemorrhagic parenchymal contusions within the anterior right temporal lobe (measuring up to 9 mm) with overlying small-volume acute subarachnoid hemorrhage. Thin acute subdural hemorrhage along the left tentorium, measuring up to 3 mm in thickness. Right parietooccipital scalp hematoma. Moderate chronic small vessel ischemic changes within the cerebral white matter. Mild generalized cerebral atrophy. Bilateral mastoid effusions. Electronically Signed   By: Kellie Simmering D.O.   On: 05/13/2022 18:02   CT Cervical Spine Wo Contrast  Result Date: 05/13/2022 CLINICAL DATA:  Neck trauma. Additional history provided: Patient found on floor at home by neighbor. Laceration to back of head. EXAM: CT CERVICAL SPINE WITHOUT CONTRAST TECHNIQUE: Multidetector CT imaging of the cervical spine was performed without intravenous contrast.  Multiplanar CT image reconstructions were also generated. RADIATION DOSE REDUCTION: This exam was performed according to the departmental dose-optimization program which includes automated exposure control, adjustment of the mA and/or kV according to patient size and/or use of iterative reconstruction technique. COMPARISON:  Radiographs of the cervical spine 08/24/2017. Cervical spine CT 08/24/2017. FINDINGS: Alignment: Slight cervical dextrocurvature. 2 mm C2-C3 grade 1 anterolisthesis. 4 mm C3-C4 grade 1 retrolisthesis. Skull base and vertebrae: The basion-dental and atlanto-dental intervals are maintained.No evidence of acute fracture to the cervical spine. Soft tissues and spinal canal: No prevertebral fluid or swelling. No visible canal hematoma. Disc levels: Cervical spondylosis with multilevel disc space narrowing, disc bulges, posterior disc osteophytes, endplate spurring, uncovertebral hypertrophy and facet arthrosis. Disc degeneration is greatest at C3-C4, C4-C5, C5-C6 and C6-C7. Multilevel spinal canal stenosis. Most notably, there is apparent at least moderate spinal canal stenosis at C3-C4. Multilevel bony neural foraminal narrowing. Upper chest: No consolidation within the imaged lung apices. No visible pneumothorax. IMPRESSION: No evidence of acute fracture to the cervical spine. 2 mm grade 1 anterolisthesis at C2-C3. 4 mm grade 1 retrolisthesis at C3-C4. Cervical spondylosis, as described. Multilevel spinal canal stenosis. Notably, there appears to be at least moderate spinal canal stenosis at C3-C4. Multilevel bony neural foraminal narrowing. Disc degeneration is advanced at C3-C4, C4-C5, C5-C6 and C6-C7. Electronically Signed   By: Kellie Simmering D.O.   On: 05/13/2022 18:10   CT Thoracic Spine Wo Contrast  Result Date: 05/13/2022 CLINICAL DATA:  Found down by neighbor. EXAM: CT THORACIC SPINE WITHOUT CONTRAST TECHNIQUE: Multidetector CT images of the thoracic were obtained using the standard  protocol without intravenous contrast. RADIATION DOSE REDUCTION: This exam was performed according to the departmental dose-optimization program which includes automated exposure control, adjustment of the mA and/or kV according to patient size and/or  use of iterative reconstruction technique. COMPARISON:  PET-CT dated May 01, 2022. Chest x-ray dated February 08, 2022. CT chest dated August 24, 2017. FINDINGS: Alignment: No traumatic malalignment. Vertebrae: No acute fracture or focal pathologic process. Paraspinal and other soft tissues: Old, partially healed nondisplaced fracture of the posterior left seventh rib. Incompletely visualized right renal staghorn calculus. Disc levels: Mild degenerative disc disease of the mid to lower thoracic spine. No evident high-grade spinal canal or neuroforaminal stenosis. IMPRESSION: 1. No acute osseous abnormality. Electronically Signed   By: Titus Dubin M.D.   On: 05/13/2022 18:02   DG Humerus Right  Result Date: 05/13/2022 CLINICAL DATA:  Trauma, pain EXAM: RIGHT HUMERUS - 2+ VIEW COMPARISON:  None Available. FINDINGS: No recent fracture or dislocation is seen. There is decreased distance between humeral head and acromion. There is widening of right AC joint space measuring 7 mm. There is no increase in coracoclavicular distance. Surgical clips are seen in the right axilla. IMPRESSION: No recent fracture or dislocation is seen in the right humerus. Decreased distance between humeral head and acromion may suggest chronic tear of rotator cuff. There is some widening of right AC joint space, possibly suggesting a normal variation or right AC separation. Electronically Signed   By: Elmer Picker M.D.   On: 05/13/2022 17:34    Assessment/Plan: Patient suffered an unwitnessed fall and was brought to the emergency department on 05/13/2022. She had a small SAH. Her mental status is at baseline. CT scan c-spine revealed multilevel degenerative spondylosis. Stenosis  greatest at C3-4. There were no acute abnormalities noted.    LOS: 2 days   -Ok to start chemical DVT prophylaxis -Will get cervical MRI to further assess for neural impingement. Given patient's age and comorbidities, surgery might not be a treatment option if there are significant findings.   Viona Gilmore, DNP, AGNP-C Nurse Practitioner  [facility] [place] [address], [address], [place], [place] [address] P: [phone]    F: [phone]  05/15/2022, 10:41 AM

## 2022-05-15 NOTE — Consult Note (Addendum)
Consultation Note Date: 05/15/2022   Patient Name: Jane Andrews  DOB: 02-Jul-1925  MRN: [id number]  Age / Sex: 88 y.o., female  PCP: Redmond School, MD Referring Physician: Md, Trauma, MD  Reason for Consultation: Establishing goals of care  HPI/Patient Profile: 88 y.o. 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   Clinical Assessment and Goals of Care: I have reviewed medical records including EPIC notes, labs and imaging, received report from RN, assessed the patient.  Jane Andrews is lying quietly in bed.  She greets me, making and mostly keeping eye contact.  She appears somewhat frail and tired.  She is alert and oriented x3, able to make her basic needs known.  There is no family at bedside at this time.  We met at the bedside to discuss diagnosis prognosis, GOC, EOL wishes, disposition and options.  I introduced Palliative Medicine as specialized medical care for people living with serious illness. It focuses on providing relief from the symptoms and stress of a serious illness. The goal is to improve quality of life for both the patient and the  family.  We discussed a brief life review of the patient.  Jane Andrews shares that her husband died in February 15, 2012.  She has no children.  She is close with her niece, Jane Andrews.  Jane Andrews shares that she had been living independently managing her own household.  Addendum: Jane Andrews tells me that she has been incontinent of stool since bowel resection in 2003-02-14.  We then focused on their current illness.  We talk about her fall, physical therapy evaluation, recommendations for CRR.  We talk about her numbness/tingling in her hands and legs.  While we are visiting the NP from neurosurgery arrives.  At this point the plan is for MRI of cervical spine to assess for neural impingement.  The natural disease trajectory and expectations at EOL were discussed.  Advanced directives, concepts specific to code status, were considered and discussed.  Jane Andrews states that she has not considered CODE STATUS.  We talked about the concept of "treat the treatable, but allowing natural death".  I encourage Jane Andrews to speak with her niece/healthcare agent Jane Andrews about her choices.  We talk about outpatient palliative services to continue goals of care discussions.   Palliative Care services outpatient were briefly discussed.   Discussed the importance of continued conversation with family and the medical providers regarding  overall plan of care and treatment options, ensuring decisions are within the context of the patient's values and GOCs.  Questions and concerns were addressed.  The family was encouraged to call with questions or concerns.  PMT will continue to support holistically.  Conference with attending, bedside nursing staff, transition of care team, physical therapy, related to patient condition, needs, goals of care, disposition.   HCPOA HCPOA -Jane Andrews names her niece, Jane Andrews, as her healthcare surrogate.  She tells me that her husband died in February 13, 2012 and she has no  children.    SUMMARY OF RECOMMENDATIONS   At this point continue to treat the treatable Full scope/full code Time for outcomes Agreeable to CIR rehab if offered Goal is to return to her own home   Code Status/Advance Care Planning: Full code -Jane Andrews states that she has not really considered CODE STATUS.  We talked about the concept of "treat the treatable, but allowing natural passing".  We also talk about setting limits on length of time on life support if she so desires.  Symptom Management:  Per hospitalist, no additional needs at this time.  Palliative Prophylaxis:  Frequent Pain Assessment and Oral Care  Additional Recommendations (Limitations, Scope, Preferences): Full Scope Treatment  Psycho-social/Spiritual:  Desire for further Chaplaincy support:yes Additional Recommendations: Caregiving  Support/Resources  Prognosis:  Unable to determine, based on outcomes.  1 year or more would not be surprising based on current functional status.  Discharge Planning:  To be determined, based on outcomes.  At this point may qualify for CIR.        Primary Diagnoses: Present on Admission:  TBI (traumatic brain injury) ([place])   I have reviewed the medical record, interviewed the patient and family, and examined the patient. The following aspects are pertinent.  Past Medical History:  Diagnosis Date   Abdominal adhesions 02/12/93   Allergic rhinitis    Anemia    Anxiety and depression    Aortic stenosis    Arthritis    Atrial fibrillation ([place]) 10/2012   Associated with severe anemia and esophageal pill impaction   Breast carcinoma ([facility])    Right mastectomy   Cholelithiasis    Essential hypertension    Gastroesophageal reflux disease    Hiatal hernia   History of blood transfusion    Hypothyroidism    Low back pain    Malabsorption    Short gut syndrome following small bowel resection surgery x2   Nephrolithiasis 02-12-03   Painless hematuria   Short gut syndrome     Bowel resection , 02-12-03   Upper GI bleed 02/12/2003   Multiple episodes of melena-? due to gastritis or adverse drug effect (nonsteroidals, small bowel ulceration with Fosamax); caused by Pepto-Bismol during one Emergency Department evaluation   Social History   Socioeconomic History   Marital status: Widowed    Spouse name: Not on file   Number of children: Not on file   Years of education: 12   Highest education level: Not on file  Occupational History   Not on file  Tobacco Use   Smoking status: Former    Packs/day: 1.50    Years: 20.00    Pack years: 30.00    Types: Cigarettes   Smokeless tobacco: Never  Vaping Use   Vaping Use: Never used  Substance and Sexual Activity   Alcohol use: No   Drug use: No   Sexual activity: Not Currently    Birth control/protection: Post-menopausal, Surgical  Comment: hyst  Other Topics Concern   Not on file  Social History Narrative   Not on file   Social Determinants of Health   Financial Resource Strain: Not on file  Food Insecurity: Not on file  Transportation Needs: Not on file  Physical Activity: Not on file  Stress: Not on file  Social Connections: Not on file   Family History  Problem Relation Age of Onset   Anuerysm Father    Rheum arthritis Sister    Healthy Sister    COPD Sister    Healthy Brother    Cancer Other    Colon cancer Neg Hx    Scheduled Meds:  acetaminophen  1,000 mg Oral Q6H   Chlorhexidine Gluconate Cloth  6 each Topical Daily   DULoxetine  90 mg Oral Daily   [START ON 05/16/2022] enoxaparin (LOVENOX) injection  30 mg Subcutaneous Q24H   levETIRAcetam  500 mg Oral BID   levothyroxine  75 mcg Oral QAC breakfast   methocarbamol  750 mg Oral QID   metoprolol succinate  25 mg Oral Daily   mirabegron ER  25 mg Oral Daily   sodium bicarbonate  650 mg Oral BID   Continuous Infusions: PRN Meds:.ALPRAZolam, morphine injection, ondansetron **OR** ondansetron (ZOFRAN) IV, traMADol Medications Prior to  Admission:  Prior to Admission medications   Medication Sig Start Date End Date Taking? Authorizing Provider  acetaminophen (TYLENOL) 500 MG tablet Take 500 mg by mouth daily as needed for headache (pain).   Yes [provider]  ALPRAZolam Duanne Moron) 0.5 MG tablet Take 0.5-1 mg by mouth See admin instructions. Take 1-2 tablets (0.5-1 mg) daily at bedtime, take 1 tablet (0.5 mg) during the day as needed for anxiety 12/19/21  Yes [provider]  Ascorbic Acid (VITAMIN C PO) Take 1 tablet by mouth every morning.   Yes [provider]  Cholecalciferol (VITAMIN D3 PO) Take 1 capsule by mouth every morning.   Yes [provider]  cyanocobalamin (,VITAMIN B-12,) 1000 MCG/ML injection Inject 1,000 mcg into the muscle every 30 (thirty) days. 07/16/20  Yes [provider]  diphenoxylate-atropine (LOMOTIL) 2.5-0.025 MG tablet Take 1 tablet by mouth daily as needed for diarrhea or loose stools.   Yes [provider]  DULoxetine (CYMBALTA) 30 MG capsule Take 30 mg by mouth every morning. Take with a 60 mg capsule for a total of 90 mg daily   Yes [provider]  DULoxetine (CYMBALTA) 60 MG capsule Take 60 mg by mouth every morning. Take with a 30 mg capsule for a total of 90 mg daily 03/30/22  Yes [provider]  fluticasone (FLONASE) 50 MCG/ACT nasal spray Place 1 spray into both nostrils 2 (two) times daily. Patient taking differently: Place 1 spray into both nostrils daily as needed for rhinitis. 02/08/22  Yes Volney American, PA-C  levothyroxine (SYNTHROID) 75 MCG tablet Take 1 tablet (75 mcg total) by mouth daily before breakfast. Patient taking differently: Take 75 mcg by mouth every morning. 07/09/20  Yes Gerlene Fee, NP  Meth-Hyo-M Barnett Hatter Phos-Ph Sal (URIBEL) 118 MG CAPS Take 1 capsule (118 mg total) by mouth 4 (four) times daily as needed (pain with urination). 04/23/22  Yes Stoneking, Reece Leader., MD  metoprolol succinate  (TOPROL-XL) 25 MG 24 hr tablet Take 1 tablet (25 mg total) by mouth daily. Patient taking differently: Take 25 mg by mouth every morning. 07/09/20  Yes Gerlene Fee, NP  mirabegron ER (MYRBETRIQ) 25 MG TB24  tablet Take 1 tablet (25 mg total) by mouth daily. Patient taking differently: Take 25 mg by mouth every morning. 04/23/22  Yes Stoneking, Reece Leader., MD  Multiple Vitamins-Iron (MULTIVITAMINS WITH IRON) TABS tablet Take 1 tablet by mouth every morning. 06/19/20  Yes [provider]  pantoprazole (PROTONIX) 40 MG tablet Take 1 tablet (40 mg total) by mouth 2 (two) times daily. Patient taking differently: Take 40 mg by mouth every morning. 07/09/20  Yes Gerlene Fee, NP  Probiotic Product (PROBIOTIC PO) Take 1 tablet by mouth every morning.   Yes [provider]  sodium bicarbonate 650 MG tablet Take 650 mg by mouth 2 (two) times daily. 09/20/20  Yes [provider]  traMADol (ULTRAM) 50 MG tablet Take 1 tablet (50 mg total) by mouth every 6 (six) hours as needed. Patient taking differently: Take 50 mg by mouth See admin instructions. Take one tablet (50 mg) by mouth every morning, take one tablet (50 mg) during the day as needed for pain 04/18/22 04/18/23 Yes McKenzie, Candee Furbish, MD   Allergies  Allergen Reactions   Codeine Nausea And Vomiting   Review of Systems  Unable to perform ROS: Age   Physical Exam Vitals and nursing note reviewed.  Constitutional:      General: She is not in acute distress.    Appearance: Normal appearance. She is normal weight. She is not ill-appearing.  HENT:     Mouth/Throat:     Mouth: Mucous membranes are moist.  Cardiovascular:     Rate and Rhythm: Normal rate.  Pulmonary:     Effort: Pulmonary effort is normal. No respiratory distress.  Musculoskeletal:        General: No swelling.  Skin:    General: Skin is warm and dry.  Neurological:     Mental Status: She is alert and oriented to person, place, and time.   Psychiatric:        Mood and Affect: Mood normal.        Behavior: Behavior normal.    Vital Signs: BP (!) 141/69 (BP Location: Left Arm)   Pulse 71   Temp 98.5 F (36.9 C) (Oral)   Resp 15   Ht _0  (1.549 m)   Wt 54.4 kg   SpO2 96%   BMI 22.67 kg/m  Pain Scale: 0-10   Pain Score: 5    SpO2: SpO2: 96 % O2 Device:SpO2: 96 % O2 Flow Rate: .   IO: Intake/output summary:  Intake/Output Summary (Last 24 hours) at 05/15/2022 1226 Last data filed at 05/15/2022 0303 Gross per 24 hour  Intake 425.74 ml  Output 1150 ml  Net -724.26 ml    LBM: Last BM Date :  (PTA) Baseline Weight: Weight: 54.4 kg Most recent weight: Weight: 54.4 kg     Palliative Assessment/Data:   Flowsheet Rows    Flowsheet Row Most Recent Value  Intake Tab   Referral Department Hospitalist  Unit at Time of Referral Intermediate Care Unit  Palliative Care Primary Diagnosis Trauma  Date Notified 05/14/22  Palliative Care Type New Palliative care  Date of Admission 05/13/22  Date first seen by Palliative Care 05/15/22  # of days Palliative referral response time 1 Day(s)  # of days IP prior to Palliative referral 1  Clinical Assessment   Palliative Performance Scale Score 60%  Pain Max last 24 hours Not able to report  Pain Min Last 24 hours Not able to report  Dyspnea Max Last 24 Hours Not  able to report  Dyspnea Min Last 24 hours Not able to report  Psychosocial & Spiritual Assessment   Palliative Care Outcomes        Time In: 1000 Time Out: 1115 Time Total: 75 minutes  Greater than 50%  of this time was spent counseling and coordinating care related to the above assessment and plan.  Signed by: Drue Novel, NP   Please contact Palliative Medicine Team phone at [phone] for questions and concerns.  For individual provider: See Shea Evans

## 2022-05-15 NOTE — Progress Notes (Signed)
Trauma Event Note    TRN at bedside to round, pt finishing her bath. VS WDL, pt stable, denies needs at this time. Checked in with primary nurse, who has no needs for TRN at this time.   Last imported Vital Signs BP 138/64 (BP Location: Left Arm)   Pulse 72   Temp 98.8 F (37.1 C) (Oral)   Resp 14   Ht '5\' 1"'$  (1.549 m)   Wt 120 lb (54.4 kg)   SpO2 96%   BMI 22.67 kg/m   Trending CBC Recent Labs    05/13/22 1652 05/14/22 0513  WBC 13.4* 9.4  HGB 9.7* 9.4*  HCT 30.5* 28.3*  PLT 183 183    Trending Coag's No results for input(s): APTT, INR in the last 72 hours.  Trending BMET Recent Labs    05/13/22 1652 05/14/22 0513  NA 137 137  K 4.6 4.2  CL 108 108  CO2 22 23  BUN 29* 24*  CREATININE 1.73* 1.61*  GLUCOSE 160* 115*      Pierrette Scheu O Shenia Alan  Trauma Response RN  Please call TRN at [phone] for further assistance.

## 2022-05-15 NOTE — Progress Notes (Signed)
  Progress Note   Date: 05/15/2022  Patient Name: Jane Andrews        MRN#: [id number]  Review the patient's clinical findings supports the diagnosis of:   CKD Stage 4

## 2022-05-16 ENCOUNTER — Inpatient Hospital Stay (HOSPITAL_COMMUNITY): Payer: Medicare Other

## 2022-05-16 MED ORDER — GABAPENTIN 100 MG PO CAPS
100.0000 mg | ORAL_CAPSULE | Freq: Every day | ORAL | Status: DC
  Administered 2022-05-16: 100 mg via ORAL
  Filled 2022-05-16 (×2): qty 1

## 2022-05-16 NOTE — PMR Pre-admission (Signed)
PMR Admission Coordinator Pre-Admission Assessment  Patient: Jane Andrews is an 88 y.o., female MRN: [id number] DOB: 01-Oct-1925 Height: '5\' 1"'  (154.9 cm) Weight: 54.4 kg  Insurance Information HMO:     PPO:      PCP:      IPA:      80/20:      OTHER:  PRIMARY: Medicare A and B      Policy#: [id number]      Subscriber: patient CM Name:        Phone#:       Fax#:   Pre-Cert#:        Employer: Retired Benefits:  Phone #:       Name: Checked in passport one source [employer] Date: 09/14/92     Deduct: $1600      Out of Pocket Max: none      Life Max: N/A CIR: 100%      SNF: 100 days Outpatient: 80%     Co-Pay: 20% Home Health: 100%      Co-Pay: none DME: 80%     Co-Pay: 20% Providers: patient's choice  SECONDARY: BCBS federal emp PPO      Policy#: [id number]     Phone#: [phone]  Financial Counselor:        Phone#:    The "Data Collection Information Summary" for patients in Inpatient Rehabilitation Facilities with attached "Privacy Act [place] Records" was provided and verbally reviewed with: Patient  Emergency Contact Information Contact Information     Name Relation Home Work Mobile   [place] Niece [phone]  [phone]   dove,dwight Brother [phone]         Current Medical History  Patient Admitting Diagnosis: Fall with TBI   History of Present Illness: A 88 yo F lives alone and fell while walking from 1 room to another today.  No loss of consciousness.  She is uncertain why she fell.  She was evaluated at [facility] on 05/13/22 and found to have traumatic brain injury including bilateral frontal subarachnoid hemorrhage, right temporal lobe intracerebral contusion and a left tentorium subdural.  She was also found to have right second rib fracture. She was accepted for transfer fand admission to the trauma service at [facility].  She gets dizzy when she moves her head.  She also complains of tingling in her whole body.  GCS 15.   Neurosurgery saw patient on 05/14/22 with no surgical intervention recommended. Palliative care consult was done and goals of care were discussed.  PT/OT/SLP evaluations were completed with recommendations for acute inpatient rehab admission.  Patient's medical record from [facility] has been reviewed by the rehabilitation admission coordinator and physician.  Past Medical History  Past Medical History:  Diagnosis Date   Abdominal adhesions 1994   Allergic rhinitis    Anemia    Anxiety and depression    Aortic stenosis    Arthritis    Atrial fibrillation ([place]) 10/2012   Associated with severe anemia and esophageal pill impaction   Breast carcinoma ([place])    Right mastectomy   Cholelithiasis    Essential hypertension    Gastroesophageal reflux disease    Hiatal hernia   History of blood transfusion    Hypothyroidism    Low back pain    Malabsorption    Short gut syndrome following small bowel resection surgery x2   Nephrolithiasis 2004   Painless hematuria   Short gut syndrome    Bowel resection , 2004   Upper GI  bleed 2004   Multiple episodes of melena-? due to gastritis or adverse drug effect (nonsteroidals, small bowel ulceration with Fosamax); caused by Pepto-Bismol during one Emergency Department evaluation    Has the patient had major surgery during 100 days prior to admission? Yes  Family History   family history includes Anuerysm in her father; COPD in her sister; Cancer in an other family member; Healthy in her brother and sister; Rheum arthritis in her sister.  Current Medications  Current Facility-Administered Medications:    acetaminophen (TYLENOL) tablet 1,000 mg, 1,000 mg, Oral, Q6H, Lovick, Montel Culver, MD, 1,000 mg at 05/16/22 1309   ALPRAZolam (XANAX) tablet 0.5 mg, 0.5 mg, Oral, QHS PRN, Georganna Skeans, MD, 0.5 mg at 05/15/22 2320   Chlorhexidine Gluconate Cloth 2 % PADS 6 each, 6 each, Topical, Daily, Jesusita Oka, MD, 6 each at 05/16/22 1019    DULoxetine (CYMBALTA) DR capsule 90 mg, 90 mg, Oral, Daily, Georganna Skeans, MD, 90 mg at 05/16/22 1019   enoxaparin (LOVENOX) injection 30 mg, 30 mg, Subcutaneous, Q24H, Lovick, Montel Culver, MD, 30 mg at 05/16/22 1019   gabapentin (NEURONTIN) capsule 100 mg, 100 mg, Oral, QHS, Simaan, Elizabeth S, PA-C   levETIRAcetam (KEPPRA) tablet 500 mg, 500 mg, Oral, BID, Lovick, Montel Culver, MD, 500 mg at 05/16/22 1019   levothyroxine (SYNTHROID) tablet 75 mcg, 75 mcg, Oral, QAC breakfast, Georganna Skeans, MD, 75 mcg at 05/16/22 4235   methocarbamol (ROBAXIN) tablet 750 mg, 750 mg, Oral, QID, Jesusita Oka, MD, 750 mg at 05/16/22 1309   metoprolol succinate (TOPROL-XL) 24 hr tablet 25 mg, 25 mg, Oral, Daily, Georganna Skeans, MD, 25 mg at 05/16/22 1019   mirabegron ER (MYRBETRIQ) tablet 25 mg, 25 mg, Oral, Daily, Georganna Skeans, MD, 25 mg at 05/16/22 1019   morphine (PF) 2 MG/ML injection 1-2 mg, 1-2 mg, Intravenous, Q4H PRN, Jesusita Oka, MD, 2 mg at 05/15/22 2334   ondansetron (ZOFRAN-ODT) disintegrating tablet 4 mg, 4 mg, Oral, Q6H PRN **OR** ondansetron (ZOFRAN) injection 4 mg, 4 mg, Intravenous, Q6H PRN, Georganna Skeans, MD, 4 mg at 05/15/22 0858   sodium bicarbonate tablet 650 mg, 650 mg, Oral, BID, Georganna Skeans, MD, 650 mg at 05/16/22 1019   traMADol (ULTRAM) tablet 50 mg, 50 mg, Oral, Q6H PRN, Georganna Skeans, MD, 50 mg at 05/16/22 1019  Patients Current Diet:  Diet Order             Diet regular Room service appropriate? Yes; Fluid consistency: Thin  Diet effective now                   Precautions / Restrictions Precautions Precautions: Fall Restrictions Weight Bearing Restrictions: (P) No Other Position/Activity Restrictions: 2nd rib fx   Has the patient had 2 or more falls or a fall with injury in the past year? Yes  Prior Activity Level Community (5-7x/wk): Went out daily.  Was driving, was independent.  Went out to eat with friends.  Prior Functional Level Self Care:  Did the patient need help bathing, dressing, using the toilet or eating? Independent  Indoor Mobility: Did the patient need assistance with walking from room to room (with or without device)? Independent  Stairs: Did the patient need assistance with internal or external stairs (with or without device)? Independent  Functional Cognition: Did the patient need help planning regular tasks such as shopping or remembering to take medications? Independent  Patient Information Are you of Hispanic, Latino/a,or Spanish origin?: A. No, not of  Hispanic, Latino/a, or Spanish origin What is your race?: A. White Do you need or want an interpreter to communicate with a doctor or health care staff?: 0. No  Patient's Response To:  Health Literacy and Transportation Is the patient able to respond to health literacy and transportation needs?: Yes Health Literacy - How often do you need to have someone help you when you read instructions, pamphlets, or other written material from your doctor or pharmacy?: Never In the past 12 months, has lack of transportation kept you from medical appointments or from getting medications?: No In the past 12 months, has lack of transportation kept you from meetings, work, or from getting things needed for daily living?: No  Home Assistive Devices / Equipment Home Equipment: Conservation officer, nature (2 wheels), [employer] - single point, Civil engineer, contracting, Grab bars - tub/shower  Prior Device Use: Indicate devices/aids used by the patient prior to current illness, exacerbation or injury? None of the above  Current Functional Level Cognition  Arousal/Alertness: Awake/alert Overall Cognitive Status: Impaired/Different from baseline Current Attention Level: Sustained Orientation Level: (P) Oriented X4 Following Commands: Follows one step commands with increased time Safety/Judgement: Decreased awareness of deficits General Comments: reports feeling "depressed" described it was related to not  being able to walk and move about the way she was before Attention: Focused, Sustained Focused Attention: Appears intact Sustained Attention: Impaired Memory: Impaired Memory Impairment: Decreased short term memory, Storage deficit (immediate: 5/5 with repetition x3; delayed: 0/5; with cues: 5/5) Awareness: Impaired Awareness Impairment: Emergent impairment Problem Solving Impairment: Verbal complex (Money: 1/3; time: 0/1) Executive Function: Sequencing, Technical brewer:  (clock drawaing: 4/4) Sequencing Impairment: Verbal complex Organizing: Impaired Organizing Impairment: Verbal complex (backward digit span: 1/2) [employer] Scales of Cognitive Functioning: Purposeful/appropriate    Extremity Assessment (includes Sensation/Coordination)  Upper Extremity Assessment: Generalized weakness, RUE deficits/detail, LUE deficits/detail RUE Sensation: decreased light touch (tingling) LUE Sensation: decreased light touch (tingling)  Lower Extremity Assessment: Generalized weakness, RLE deficits/detail, LLE deficits/detail RLE Sensation: decreased light touch (tingling) LLE Sensation: decreased light touch (tingling)    ADLs  Overall ADL's : Needs assistance/impaired Eating/Feeding: Minimal assistance, With adaptive utensils Eating/Feeding Details (indicate cue type and reason): built up handles on spoon to assist with self-feeding - will need red tubing Grooming: Moderate assistance, Sitting, Wash/dry face Grooming Details (indicate cue type and reason): able to bring hand to face with grooming items, initially stating "I can't do it" Upper Body Bathing: Maximal assistance Lower Body Bathing: Maximal assistance Upper Body Dressing : Moderate assistance, Sitting Lower Body Dressing: Total assistance, Sitting/lateral leans Lower Body Dressing Details (indicate cue type and reason): to don socks Toilet Transfer: Moderate assistance, Cueing for safety, Cueing for sequencing,  Ambulation, Rolling walker (2 wheels), Maximal assistance Toilet Transfer Details (indicate cue type and reason): simulated with transfer from eob with steps to recliner-supine on bed pan upon arrival-total a for peri care Toileting- Clothing Manipulation and Hygiene: Maximal assistance, Sit to/from stand Functional mobility during ADLs: Moderate assistance, Maximal assistance, Cueing for safety, Cueing for sequencing, Rolling walker (2 wheels) General ADL Comments: Pt with overall feeling of unwell, "tingling" all over and decreased strength and grasp in BUE-described that her body felt like "sand paper" but also described that she "couldnt feel anything"    Mobility  Overal bed mobility: Needs Assistance Bed Mobility: Sidelying to Sit, Rolling Rolling: Mod assist Sidelying to sit: Min assist Supine to sit: Min assist, HOB elevated Sit to supine: Mod assist, +2 for physical assistance General  bed mobility comments: able to roll r for bed pan removal at beginning of session. cues for hand placement on bed rails and to use ues to pull, assistance required to bring trunk upright, assistance to initiate scooting hips to eob-able to sit with no lob noted    Transfers  Overall transfer level: Needs assistance Equipment used: Rolling walker (2 wheels) Transfers: Sit to/from Stand, Bed to chair/wheelchair/BSC Sit to Stand: Mod assist Bed to/from chair/wheelchair/BSC transfer type:: Step pivot Stand pivot transfers: Max assist Step pivot transfers: Mod assist, Max assist General transfer comment: pt with ataxic movements of BLE, moreso with LLE. LLE tending to slide to midline with left lateral lean    Ambulation / Gait / Stairs / Wheelchair Mobility  Ambulation/Gait Ambulation/Gait assistance: Max Web designer (Feet): 4 Feet Assistive device: Rolling walker (2 wheels) Gait Pattern/deviations: Step-to pattern, Ataxic General Gait Details: pt with left lateral lean, ataxic movement with  BLE Gait velocity: reduced Gait velocity interpretation: <1.31 ft/sec, indicative of household ambulator    Posture / Balance Dynamic Sitting Balance Sitting balance - Comments: minA, left lateral and posterior lean Balance Overall balance assessment: Needs assistance Sitting-balance support: Single extremity supported, Bilateral upper extremity supported, Feet supported Sitting balance-Leahy Scale: Poor Sitting balance - Comments: minA, left lateral and posterior lean Postural control: Left lateral lean Standing balance support: Bilateral upper extremity supported, Reliant on assistive device for balance Standing balance-Leahy Scale: Poor Standing balance comment: min-modA, left lateral lean    Special needs/care consideration Skin Bruising on facial/head area and Behavioral consideration is depressed with current situation.   Previous Home Environment (from acute therapy documentation) Living Arrangements: Alone  Lives With: Alone Available Help at Discharge: Family, Available 24 hours/day Type of Home: House Home Layout: Two level Alternate Level Stairs-Rails: Right Alternate Level Stairs-Number of Steps: 20 Home Access: Stairs to enter Entrance Stairs-Rails: Right, Left Entrance Stairs-Number of Steps: 3 Bathroom Shower/Tub: Chiropodist: Standard Bathroom Accessibility: Yes How Accessible: Accessible via walker  Discharge Living Setting Plans for Discharge Living Setting: Patient's home, House, Alone (Lives alone, but niece can assist/hire caregiver) Type of Home at Discharge: House Discharge Home Layout: Two level, Bed/bath upstairs, 1/2 bath on main level, Other (Comment) (Has a bed/cot on the first floor that she can use.) Alternate Level Stairs-Rails: Right Alternate Level Stairs-Number of Steps: 20 steps to second level Discharge Home Access: Stairs to enter Entrance Stairs-Rails: Right, Left Entrance Stairs-Number of Steps: 3 steps Discharge  Bathroom Shower/Tub: Tub/shower unit Discharge Bathroom Toilet: Standard Discharge Bathroom Accessibility: Yes How Accessible: Accessible via walker  Social/Family/Support Systems Patient Roles: Other (Comment) (Has a niece and a brother/sister-in-law) Contact Information: Vanessa Barbara - niece - [phone] Anticipated Caregiver: Niece and hired caregiver Ability/Limitations of Caregiver: Niece can provide supervision intermittently but will hire caregiver as needed. Caregiver Availability: 24/7 Discharge Plan Discussed with Primary Caregiver: Yes Is Caregiver In Agreement with Plan?: Yes Does Caregiver/Family have Issues with Lodging/Transportation while Pt is in Rehab?: No  Goals Patient/Family Goal for Rehab: PT/OT/SLP supervision to min assist goals Expected length of stay: 12-14 days Pt/Family Agrees to Admission and willing to participate: Yes Program Orientation Provided & Reviewed with Pt/Caregiver Including Roles  & Responsibilities: Yes  Decrease burden of Care through IP rehab admission: N/A  Possible need for SNF placement upon discharge: Not planned  Patient Condition: I have reviewed medical records from [facility], spoken with CM, and patient and family member. I met with patient at the bedside and  discussed via phone for inpatient rehabilitation assessment.  Patient will benefit from ongoing PT, OT, and SLP, can actively participate in 3 hours of therapy a day 5 days of the week, and can make measurable gains during the admission.  Patient will also benefit from the coordinated team approach during an Inpatient Acute Rehabilitation admission.  The patient will receive intensive therapy as well as Rehabilitation physician, nursing, social worker, and care management interventions.  Due to bladder management, bowel management, safety, skin/wound care, disease management, medication administration, pain management, and patient education the patient requires 24 hour a day  rehabilitation nursing.  The patient is currently from min assist to max assist with mobility and basic ADLs.  Discharge setting and therapy post discharge at home with home health is anticipated.  Patient has agreed to participate in the Acute Inpatient Rehabilitation Program and will admit tomorrow, 05/17/22.  Preadmission Screen Completed By:  Retta Diones, 05/16/2022 1:47 PM ______________________________________________________________________   Discussed status with Dr. Ranell Patrick on 05/16/22 at 1:30 pm and received approval for admission tomorrow, 05/17/22.  Admission Coordinator:  Retta Diones, RN, time 1:53 pm/Date 05/16/22   Assessment/Plan: Diagnosis: Does the need for close, 24 hr/day Medical supervision in concert with the patient's rehab needs make it unreasonable for this patient to be served in a less intensive setting? {yes_no_potentially:3041433} Co-Morbidities requiring supervision/potential complications: *** Due to {due HC:0979499}, does the patient require 24 hr/day rehab nursing? {yes_no_potentially:3041433} Does the patient require coordinated care of a physician, rehab nurse, PT, OT, and SLP to address physical and functional deficits in the context of the above medical diagnosis(es)? {yes_no_potentially:3041433} Addressing deficits in the following areas: {deficits:3041436} Can the patient actively participate in an intensive therapy program of at least 3 hrs of therapy 5 days a week? {yes_no_potentially:3041433} The potential for patient to make measurable gains while on inpatient rehab is {potential:3041437} Anticipated functional outcomes upon discharge from inpatient rehab: {functional outcomes:304600100} PT, {functional outcomes:304600100} OT, {functional outcomes:304600100} SLP Estimated rehab length of stay to reach the above functional goals is: *** Anticipated discharge destination: {anticipated dc setting:21604} 10. Overall Rehab/Functional Prognosis:  {potential:3041437}   MD Signature: ***

## 2022-05-16 NOTE — Care Management Important Message (Signed)
Important Message  Patient Details  Name: Jane Andrews MRN: [id number] Date of Birth: 06-19-1925   Medicare Important Message Given:  Yes     Orbie Pyo 05/16/2022, 2:38 PM

## 2022-05-16 NOTE — TOC Initial Note (Signed)
Transition of Care [facility]) - Initial/Assessment Note    Patient Details  Name: Jane Andrews MRN: [id number] Date of Birth: 06-25-1925  Transition of Care [facility]) CM/SW Contact:    Ella Bodo, RN Phone Number: 05/16/2022, 4:42 PM  Clinical Narrative:                 88 y.o. female presents to [facility] hospital on 05/13/2022 after sustaining a fall. 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 Pt also with R 2nd rib fx. PT/OT recommending CIR, and bed available on Saturday, June 3.  Family available to provide assistance at discharge; they plan to hire caregivers as needed.  Expected Discharge Plan: IP Rehab Facility Barriers to Discharge: Barriers Resolved   Patient Goals and CMS Choice   CMS Medicare.gov Compare Post Acute Care list provided to:: Patient Choice offered to / list presented to : Patient  Expected Discharge Plan and Services Expected Discharge Plan: [place]   Discharge Planning Services: CM Consult Post Acute Care Choice: IP Rehab Living arrangements for the past 2 months: Single Family Home                                      Prior Living Arrangements/Services Living arrangements for the past 2 months: Single Family Home Lives with:: Self Patient language and need for interpreter reviewed:: Yes Do you feel safe going back to the place where you live?: Yes      Need for Family Participation in Patient Care: Yes (Comment) Care giver support system in place?: Yes (comment)   Criminal Activity/Legal Involvement Pertinent to Current Situation/Hospitalization: No - Comment as needed               Emotional Assessment Appearance:: Appears stated age Attitude/Demeanor/Rapport: Engaged Affect (typically observed): Accepting Orientation: : Oriented to Self, Oriented to Place, Oriented to  Time, Oriented to Situation      Admission diagnosis:  Subarachnoid hemorrhage (Home)  [I60.9] Subdural hemorrhage (HCC) [I62.00] SAH (subarachnoid hemorrhage) (HCC) [I60.9] Intraparenchymal hemorrhage of brain ([place]) [I61.9] Fall, initial encounter [W19.XXXA] Blunt head trauma, initial encounter [S09.8XXA] Laceration of scalp, initial encounter [S01.01XA] Closed fracture of one rib of right side, initial encounter [S22.31XA] TBI (traumatic brain injury) ([place]) [S06.9XAA] Patient Active Problem List   Diagnosis Date Noted   SAH (subarachnoid hemorrhage) ([place]) 05/13/2022   TBI (traumatic brain injury) ([place]) 05/13/2022   Bladder mass 04/16/2022   Hyperkalemia 04/09/2022   Symptomatic anemia 02/11/2022   SIRS (systemic inflammatory response syndrome) ([place]) 02/10/2022   Bilateral external ear infections 02/10/2022   Sinusitis 02/10/2022   Nephrolithiasis 01/07/2022   Vulvar irritation 12/20/2021   Burning with urination 12/20/2021   Symptoms of urinary tract infection 12/20/2021   Diarrhea 03/05/2021   Scoliosis 06/26/2020   Thrombocytopenia ([place]) 06/22/2020   Aspiration pneumonia ([place]) 06/22/2020   Chronic constipation 06/21/2020   Post-menopausal osteoporosis 06/21/2020   Essential tremor 06/21/2020   Vitamin B 12 deficiency 06/21/2020   Major depression, recurrent, chronic ([place]) 06/21/2020   Status post total replacement of right hip    AKI (acute kidney injury) ([place])    Postoperative anemia due to acute blood loss    Closed right hip fracture (HCC) 06/14/2020   IDA (iron deficiency anemia) 02/28/2020   Atrial fibrillation with RVR ([place]) 12/25/2018   GERD (gastroesophageal reflux disease) 12/25/2018   Chronic  cholecystitis s/p lap cholecystectomy 06/25/2018 06/18/2018   Vagal reaction 04/12/2016   Abdominal pain 09/19/2014   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   Ecchymoses, spontaneous 05/31/2013   Hypothyroid    Atrial fibrillation (HCC)    Breast carcinoma (HCC)    Malabsorption    CKD (chronic kidney disease) stage 4, GFR 15-29 ml/min ([place])  10/21/2012   Anemia, normocytic normochromic 07/06/2012   Chronic diarrhea 02/10/2012   Hypertension 02/10/2012   PCP:  Redmond School, MD Pharmacy:   [facility], [address] [place] [address] Phone: [phone] Fax: [phone]  [facility] [place] [facility] [address] [place] [address] Phone: [phone] Fax: [phone]     Social Determinants of Health (SDOH) Interventions    Readmission Risk Interventions    06/15/2020   10:21 AM  Readmission Risk Prevention Plan  Medication Screening Complete  Transportation Screening Complete   Reinaldo Raddle, RN, BSN  Trauma/Neuro ICU Case Manager [phone]

## 2022-05-16 NOTE — Progress Notes (Signed)
Occupational Therapy Treatment Patient Details Name: PRINCELLA JASKIEWICZ MRN: [id number] DOB: Apr 25, 1925 Today's Date: 05/16/2022   History of present illness 88 y.o. female presents to [facility] hospital on 05/13/2022 after sustaining a fall. 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 Pt also with R 2nd rib fx. PMH includes anxiety, depression, afib, breast CA, HTN, GIB, GERD.   OT comments  Pt. Seen for skilled OT session.  Niece present for session also.  Pt. Able to complete bed mobility with cues for hand placement and sequencing of task.  Step pivot to recliner mod/max a.  Aware of her deficits "my legs wont move and im going this way" (referencing lean) but unable to correct consistently with verbal and tactile cues.  Expresses frustration with her current situation.  Niece also providing support but unable to redirect pt. Either.  Agree with current d/c recommendations as pt. Will benefit from continued intense level therapies prior to home.     Recommendations for follow up therapy are one component of a multi-disciplinary discharge planning process, led by the attending physician.  Recommendations may be updated based on patient status, additional functional criteria and insurance authorization.    Follow Up Recommendations  Acute inpatient rehab (3hours/day)    Assistance Recommended at Discharge    Patient can return home with the following  A lot of help with walking and/or transfers;A lot of help with bathing/dressing/bathroom;Assistance with cooking/housework;Assistance with feeding;Direct supervision/assist for medications management;Direct supervision/assist for financial management;Assist for transportation;Help with stairs or ramp for entrance   Equipment Recommendations       Recommendations for Other Services Rehab consult    Precautions / Restrictions Precautions Precautions: Fall Restrictions Other  Position/Activity Restrictions: 2nd rib fx       Mobility Bed Mobility Overal bed mobility: Needs Assistance Bed Mobility: Sidelying to Sit, Rolling Rolling: Mod assist Sidelying to sit: Min assist       General bed mobility comments: able to roll r for bed pan removal at beginning of session. cues for hand placement on bed rails and to use ues to pull, assistance required to bring trunk upright, assistance to initiate scooting hips to eob-able to sit with no lob noted    Transfers Overall transfer level: Needs assistance Equipment used: Rolling walker (2 wheels) Transfers: Sit to/from Stand, Bed to chair/wheelchair/BSC Sit to Stand: Mod assist     Step pivot transfers: Mod assist, Max assist     General transfer comment: pt with ataxic movements of BLE, moreso with LLE. LLE tending to slide to midline with left lateral lean     Balance                                           ADL either performed or assessed with clinical judgement   ADL Overall ADL's : Needs assistance/impaired                         Toilet Transfer: Moderate assistance;Cueing for safety;Cueing for sequencing;Ambulation;Rolling walker (2 wheels);Maximal assistance Toilet Transfer Details (indicate cue type and reason): simulated with transfer from eob with steps to recliner-supine on bed pan upon arrival-total a for peri care         Functional mobility during ADLs: Moderate assistance;Maximal assistance;Cueing for safety;Cueing for sequencing;Rolling walker (2 wheels) General ADL Comments: Pt  with overall feeling of unwell, "tingling" all over and decreased strength and grasp in BUE-described that her body felt like "sand paper" but also described that she "couldnt feel anything"    Extremity/Trunk Assessment              Vision       Perception     Praxis      Cognition Arousal/Alertness: Awake/alert Behavior During Therapy: Flat affect Overall  Cognitive Status: Impaired/Different from baseline Area of Impairment: Memory, Safety/judgement, Awareness, Problem solving                 Orientation Level: Disoriented to Current Attention Level: Sustained Memory: Decreased short-term memory Following Commands: Follows one step commands with increased time Safety/Judgement: Decreased awareness of deficits Awareness: Emergent Problem Solving: Requires verbal cues, Requires tactile cues General Comments: reports feeling "depressed" described it was related to not being able to walk and move about the way she was before        Exercises      Shoulder Instructions       General Comments      Pertinent Vitals/ Pain       Pain Assessment Pain Assessment: No/denies pain  Home Living                                          Prior Functioning/Environment              Frequency  Min 2X/week        Progress Toward Goals  OT Goals(current goals can now be found in the care plan section)  Progress towards OT goals: Progressing toward goals     Plan      Co-evaluation                 AM-PAC OT "6 Clicks" Daily Activity     Outcome Measure   Help from another person eating meals?: A Lot Help from another person taking care of personal grooming?: A Lot Help from another person toileting, which includes using toliet, bedpan, or urinal?: A Lot Help from another person bathing (including washing, rinsing, drying)?: A Lot Help from another person to put on and taking off regular upper body clothing?: A Lot Help from another person to put on and taking off regular lower body clothing?: Total 6 Click Score: 11    End of Session Equipment Utilized During Treatment: Gait belt;Rolling walker (2 wheels)  OT Visit Diagnosis: Unsteadiness on feet (R26.81);Other abnormalities of gait and mobility (R26.89);Muscle weakness (generalized) (M62.81);Other symptoms and signs involving the nervous  system (R29.898);Other symptoms and signs involving cognitive function   Activity Tolerance Patient tolerated treatment well   Patient Left in chair;with call bell/phone within reach;with chair alarm set;with family/visitor present   Nurse Communication Other (comment) (alerted rn i had taken pt. off of bed pan with liquid stool)        Time: 8250-0370 OT Time Calculation (min): 26 min  Charges: OT General Charges $OT Visit: 1 Visit OT Treatments $Self Care/Home Management : 23-37 mins  Sonia Baller, COTA/L Acute Rehabilitation [phone]   Tanya Nones 05/16/2022, 12:24 PM

## 2022-05-16 NOTE — Progress Notes (Signed)
Palliative: Jane Andrews, Jane Andrews, is lying quietly in the [name] in her room.  She greets me, making but not keeping eye contact.  She appears acutely ill and somewhat frail today.  She is less engaged than yesterday.  She is alert and oriented x3, able to make her basic needs known.  Her niece/healthcare agent, Jane Andrews, is present at bedside along with CIR intake RN.  We met at the bedside to discuss diagnosis prognosis, GOC, EOL wishes, disposition and options. I introduced Palliative Medicine as specialized medical care for people living with serious illness. It focuses on providing relief from the symptoms and stress of a serious illness. The goal is to improve quality of life for both the patient and the family.  We focused on their current illness. The natural disease trajectory and expectations at EOL were discussed.  We talk in detail about Jane Andrews's acute health concerns including, but not limited to, her fall with subarachnoid bleed and the treatment plan; new diagnosis in May of bladder cancer with follow-ups for treatment options.  We also talk about CIR for short-term rehab.  Initially, Jane Andrews shares that she is reluctant to participate in rehab due to the pain/numbness in her hands and feet.  I draw diagram of the chronic illness pathway, what is normal and expected.  I share the changes that happen for those with cancers.  [name] and I talk about how to make choices for loved ones including 1) keeping them at the center of decision-making, 2 are we doing something for them or to them, can we change what is happening and 3) the person.was 5 years ago, how would that woman tell [name] to care for her now.  Advanced directives, concepts specific to code status, artifical feeding and hydration, and rehospitalization were considered and discussed.  Jane Andrews shares that she would not want life support.  We talked about the concept of "treat the treatable, but allowing natural passing".  Both.and her  healthcare surrogate agree for DNR.  I share that she will receive a purple armband.  Hospice Care services outpatient were explained and offered.  [name] shares that she is experience with at home hospice care and residential hospice.  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  At this point they are interested "treat the treatable" hospice care with hosp[facility] of [facility] upon discharge.  Conference with attending, CIR admitting RN, bedside nursing staff, transition of care team related to patient condition, needs, goals of care, disposition.  Plan: At this point continue to treat the treatable but no CPR or intubation.  Agreeable to CIR rehab.  Time for outcomes.  Ultimate goal is to return home with 24/7 care if needed.   States that she will follow-up with radiation oncology for plan but is unsure at this time what treatment she will and will not accept.  13 minutes Quinn Axe, NP Palliative medicine team Team phone [phone]

## 2022-05-16 NOTE — Progress Notes (Signed)
Speech Language Pathology Treatment: Cognitive-Linquistic  Patient Details Name: Jane Andrews MRN: [id number] DOB: Feb 17, 1925 Today's Date: 05/16/2022 Time: 5456-2563 SLP Time Calculation (min) (ACUTE ONLY): 15 min  Assessment / Plan / Recommendation Clinical Impression  Pt was seen for cognitive-linguistic treatment. She was notably more alert than during the initial evaluation. She stated that she believes her memory is improved compared to 5/31. She demonstrated 60% accuracy with a medication management (prescription task) increasing to 100% with cues. She demonstrated 20% accuracy with recall of concrete information from recorded voice-mails given verbal prompts. She required cues for providing details during a sequencing activity and often self-corrected for ordering of steps. Additional tasks were deferred due to pt's report of being "too worn out from the exercises earlier". SLP will continue to follow pt.     HPI HPI: Pt is a 88 y/o female who presented to the ED after fall. CT head: Small-volume acute subarachnoid hemorrhage overlying the anterolateral left frontal lobe also present along the anteroinferior frontal lobes, bilaterally. Superimposed hemorrhagic parenchymal contusions not excluded. Pt also with R 2nd rib fx. PMH includes anxiety, depression, afib, breast CA, HTN, GIB, GERD.      SLP Plan  Continue with current plan of care      Recommendations for follow up therapy are one component of a multi-disciplinary discharge planning process, led by the attending physician.  Recommendations may be updated based on patient status, additional functional criteria and insurance authorization.    Recommendations                   Follow Up Recommendations: Acute inpatient rehab (3hours/day) Assistance recommended at discharge: Intermittent Supervision/Assistance SLP Visit Diagnosis: Cognitive communication deficit (S93.734) Plan: Continue with current plan of  care          Rolando Hessling I. Hardin Negus, [place], [place] Office number [phone] Pager [place]  05/16/2022, 3:32 PM

## 2022-05-16 NOTE — Progress Notes (Signed)
IP rehab admissions - I met with patient, her niece, [name], and her sister in law/brother at the bedside.  I also spoke with [name] by phone.  [name] is now planning for 24/7 assist at home after rehab.  [name] can do some supervision herself and can hire caregivers as needed.  Patient is medically ready for CIR.  I will have a bed on CIR tomorrow, Saturday.  Please have nurse call report tomorrow to [phone] around 12 noon to confirm admission.  Rehab MD will see patient in am and clear for admit.  Call for questions.  [phone]

## 2022-05-16 NOTE — H&P (Shared)
Physical Medicine and Rehabilitation Admission H&P    Chief Complaint  Patient presents with   Functional deficits due to fall with TBI/trauma.     HPI: Jane Andrews. Degan is a 88 year old female with history of AF, R- MRM for breast cancer, CKD IV, R- staghorn calculus, TURBT (Dr. Felipa Eth) for bladder CA, abdominal adhesions, short gut syndrome who was admitted via [facility] on 05/13/22 after found on the floor by a neighbor with laceration on the back of her head. She was found to have right parieto-occipital scalp hematoma, small acute SAH overlying anterolateral  left frontal lobe and small SAH along anteroinferior B-fronal lobe with small hemorrhagic contusions right temporal lobe and acute SDH along left tentorium, cervical stenosis C3/4 with advanced DDD C3-C7, suspected fracture right lateral 2nd rib. Patient reported some HA as well as dizziness. Dr. Annette Stable evaluated patient and recommended conservative management as without neurologic symptoms.   She has had issues with bouts of lethargy reported numbness and tingling in bilateral hands and feet as well as sandpaper feeling in her hands. MRI C- spine ordered for work up on 06/02 to rule out nerve impingement given cervical stenosis. This revealed mild edema in posterior paraspinous soft tissue with thin hairline fracture involving left occipital calvarium extending towards jugular foramen and degenerative spondylosis C3/4 and C4/5 with moderate to severe spinal stenosis with flattening of cord (no signal changes).  C -collar recommended for support.  Palliative care consulted to discuss [place] and patient elected on DNR. Exam by Dr. Annette Stable revealed some mild weakness in grips/intrinsic function bilaterally with patchy sensory loss in BUE/BLE felt to be due to myelopathy secondary to fall in setting of severe cervical spondylosis and monitoring recommended.  She continues to be limited by pain, ataxic gait with left lean, decreased recall with  problems sequencing  as well as impaired sensation in bilateral feet. CIR recommended due to functional decline.    Review of Systems  Constitutional:  Negative for fever.  HENT:  Negative for hearing loss.   Eyes:  Negative for blurred vision.  Respiratory:  Negative for cough.   Cardiovascular:  Negative for chest pain.  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  Genitourinary:  Negative for dysuria.  Musculoskeletal:  Positive for falls (has had problems with balance) and myalgias.  Neurological:  Positive for sensory change (sandpaper/painful sensation bilateral hands. Unable to feel her feet) and weakness.  Psychiatric/Behavioral:  The patient is nervous/anxious and has insomnia.     Past Medical History:  Diagnosis Date   Abdominal adhesions 1994   Allergic rhinitis    Anemia    Anxiety and depression    Aortic stenosis    Arthritis    Atrial fibrillation ([place]) 10/2012   Associated with severe anemia and esophageal pill impaction   Breast carcinoma ([facility])    Right mastectomy   Cholelithiasis    Essential hypertension    Gastroesophageal reflux disease    Hiatal hernia   History of blood transfusion    Hypothyroidism    Low back pain    Malabsorption    Short gut syndrome following small bowel resection surgery x2   Nephrolithiasis 2004   Painless hematuria   Short gut syndrome    Bowel resection , 2004   Upper GI bleed 2004   Multiple episodes of melena-? due to gastritis or adverse drug effect (nonsteroidals, small bowel ulceration with Fosamax); caused by Pepto-Bismol during one Emergency Department evaluation  Past Surgical History:  Procedure Laterality Date   ABDOMINAL HYSTERECTOMY     emergency s/p delivery   ABDOMINAL HYSTERECTOMY  1960   massive gynecologic bleeding   BOWEL RESECTION     Resulting short gut syndrome   CHOLECYSTECTOMY N/A 06/25/2018   Procedure: LAPAROSCOPIC CHOLECYSTECTOMY WITH INTRAOPERATIVE  CHOLANGIOGRAM;  Surgeon: Armandina Gemma, MD;  Location: [facility];  Service: General;  Laterality: N/A;   COLONOSCOPY W/ POLYPECTOMY  2005   Lipoma; diverticulosis   COLONOSCOPY WITH ESOPHAGOGASTRODUODENOSCOPY (EGD)  11/22/2012   [facility]   CYSTOSCOPY W/ RETROGRADES Bilateral 04/16/2022   Procedure: CYSTOSCOPY;  Surgeon: Primus Bravo., MD;  Location: [facility];  Service: Urology;  Laterality: Bilateral;   ESOPHAGOGASTRODUODENOSCOPY (EGD) WITH PROPOFOL N/A 04/04/2022   Procedure: ESOPHAGOGASTRODUODENOSCOPY (EGD) WITH PROPOFOL;  Surgeon: Rogene Houston, MD;  Location: [facility];  Service: Endoscopy;  Laterality: N/A;  210   HIP ARTHROPLASTY Right 06/15/2020   Procedure: ANTERIOR ARTHROPLASTY BIPOLAR HIP (HEMIARTHROPLASTY);  Surgeon: Mcarthur Rossetti, MD;  Location: [facility];  Service: Orthopedics;  Laterality: Right;   LAPAROSCOPIC LYSIS OF ADHESIONS  1965   s/p adhesions   MASTECTOMY  right breast   MASTECTOMY     Carcinoma of the breast; right   TRANSURETHRAL RESECTION OF BLADDER TUMOR N/A 04/16/2022   Procedure: TRANSURETHRAL RESECTION OF BLADDER TUMOR (TURBT);  Surgeon: Primus Bravo., MD;  Location: [facility];  Service: Urology;  Laterality: N/A;   UPPER GASTROINTESTINAL ENDOSCOPY      Family History  Problem Relation Age of Onset   Anuerysm Father    Rheum arthritis Sister    Healthy Sister    COPD Sister    Healthy Brother    Cancer Other    Colon cancer Neg Hx     Social History:  reports that she has quit smoking. Her smoking use included cigarettes. She has a 30.00 pack-year smoking history. She has never used smokeless tobacco. She reports that she does not drink alcohol and does not use drugs.   Allergies  Allergen Reactions   Codeine Nausea And Vomiting    Medications Prior to Admission  Medication Sig Dispense Refill   acetaminophen (TYLENOL) 500 MG tablet Take 500 mg by mouth daily as needed for headache (pain).     ALPRAZolam (XANAX) 0.5 MG tablet Take  0.5-1 mg by mouth See admin instructions. Take 1-2 tablets (0.5-1 mg) daily at bedtime, take 1 tablet (0.5 mg) during the day as needed for anxiety     Ascorbic Acid (VITAMIN C PO) Take 1 tablet by mouth every morning.     Cholecalciferol (VITAMIN D3 PO) Take 1 capsule by mouth every morning.     cyanocobalamin (,VITAMIN B-12,) 1000 MCG/ML injection Inject 1,000 mcg into the muscle every 30 (thirty) days.     diphenoxylate-atropine (LOMOTIL) 2.5-0.025 MG tablet Take 1 tablet by mouth daily as needed for diarrhea or loose stools.     DULoxetine (CYMBALTA) 30 MG capsule Take 30 mg by mouth every morning. Take with a 60 mg capsule for a total of 90 mg daily     DULoxetine (CYMBALTA) 60 MG capsule Take 60 mg by mouth every morning. Take with a 30 mg capsule for a total of 90 mg daily     fluticasone (FLONASE) 50 MCG/ACT nasal spray Place 1 spray into both nostrils 2 (two) times daily. (Patient taking differently: Place 1 spray into both nostrils daily as needed for rhinitis.) 16 g 0   levothyroxine (SYNTHROID) 75 MCG  tablet Take 1 tablet (75 mcg total) by mouth daily before breakfast. (Patient taking differently: Take 75 mcg by mouth every morning.) 30 tablet 0   Meth-Hyo-M Bl-Na Phos-Ph Sal (URIBEL) 118 MG CAPS Take 1 capsule (118 mg total) by mouth 4 (four) times daily as needed (pain with urination). 20 capsule 5   metoprolol succinate (TOPROL-XL) 25 MG 24 hr tablet Take 1 tablet (25 mg total) by mouth daily. (Patient taking differently: Take 25 mg by mouth every morning.) 30 tablet 0   mirabegron ER (MYRBETRIQ) 25 MG TB24 tablet Take 1 tablet (25 mg total) by mouth daily. (Patient taking differently: Take 25 mg by mouth every morning.) 28 tablet 0   Multiple Vitamins-Iron (MULTIVITAMINS WITH IRON) TABS tablet Take 1 tablet by mouth every morning.     pantoprazole (PROTONIX) 40 MG tablet Take 1 tablet (40 mg total) by mouth 2 (two) times daily. (Patient taking differently: Take 40 mg by mouth every  morning.) 60 tablet 0   Probiotic Product (PROBIOTIC PO) Take 1 tablet by mouth every morning.     sodium bicarbonate 650 MG tablet Take 650 mg by mouth 2 (two) times daily.     traMADol (ULTRAM) 50 MG tablet Take 1 tablet (50 mg total) by mouth every 6 (six) hours as needed. (Patient taking differently: Take 50 mg by mouth See admin instructions. Take one tablet (50 mg) by mouth every morning, take one tablet (50 mg) during the day as needed for pain) 15 tablet 0      Home: Home Living Family/patient expects to be discharged to:: Private residence Living Arrangements: Alone Available Help at Discharge: Family, Available 24 hours/day Type of Home: House Home Access: Stairs to enter [employer] of Steps: 3 Entrance Stairs-Rails: Right, Left Home Layout: Two level Alternate Level Stairs-Number of Steps: 20 Alternate Level Stairs-Rails: Right Bathroom Shower/Tub: Chiropodist: Standard Bathroom Accessibility: Yes Home Equipment: Conservation officer, nature (2 wheels), [employer] - single point, Civil engineer, contracting, Grab bars - tub/shower  Lives With: Alone   Functional History: Prior Function Prior Level of Function : Independent/Modified Independent, Driving Mobility Comments: household and limited community ambulation without AD ADLs Comments: independent  Functional Status:  Mobility: Bed Mobility Overal bed mobility: Needs Assistance Bed Mobility: Sidelying to Sit, Rolling Rolling: Mod assist Sidelying to sit: Min assist Supine to sit: Min assist, HOB elevated Sit to supine: Mod assist, +2 for physical assistance General bed mobility comments: able to roll r for bed pan removal at beginning of session. cues for hand placement on bed rails and to use ues to pull, assistance required to bring trunk upright, assistance to initiate scooting hips to eob-able to sit with no lob noted Transfers Overall transfer level: Needs assistance Equipment used: Rolling walker (2  wheels) Transfers: Sit to/from Stand, Bed to chair/wheelchair/BSC Sit to Stand: Mod assist Bed to/from chair/wheelchair/BSC transfer type:: Step pivot Stand pivot transfers: Max assist Step pivot transfers: Mod assist, Max assist General transfer comment: pt with ataxic movements of BLE, moreso with LLE. LLE tending to slide to midline with left lateral lean Ambulation/Gait Ambulation/Gait assistance: Max assist Gait Distance (Feet): 4 Feet Assistive device: Rolling walker (2 wheels) Gait Pattern/deviations: Step-to pattern, Ataxic General Gait Details: pt with left lateral lean, ataxic movement with BLE Gait velocity: reduced Gait velocity interpretation: <1.31 ft/sec, indicative of household ambulator    ADL: ADL Overall ADL's : Needs assistance/impaired Eating/Feeding: Minimal assistance, With adaptive utensils Eating/Feeding Details (indicate cue type and reason): built up handles  on spoon to assist with self-feeding - will need red tubing Grooming: Moderate assistance, Sitting, Wash/dry face Grooming Details (indicate cue type and reason): able to bring hand to face with grooming items, initially stating "I can't do it" Upper Body Bathing: Maximal assistance Lower Body Bathing: Maximal assistance Upper Body Dressing : Moderate assistance, Sitting Lower Body Dressing: Total assistance, Sitting/lateral leans Lower Body Dressing Details (indicate cue type and reason): to don socks Toilet Transfer: Moderate assistance, Cueing for safety, Cueing for sequencing, Ambulation, Rolling walker (2 wheels), Maximal assistance Toilet Transfer Details (indicate cue type and reason): simulated with transfer from eob with steps to recliner-supine on bed pan upon arrival-total a for peri care Toileting- Clothing Manipulation and Hygiene: Maximal assistance, Sit to/from stand Functional mobility during ADLs: Moderate assistance, Maximal assistance, Cueing for safety, Cueing for sequencing, Rolling  walker (2 wheels) General ADL Comments: Pt with overall feeling of unwell, "tingling" all over and decreased strength and grasp in BUE-described that her body felt like "sand paper" but also described that she "couldnt feel anything"  Cognition: Cognition Overall Cognitive Status: Impaired/Different from baseline Arousal/Alertness: Awake/alert Orientation Level: (P) Oriented X4 Year: 2023 Month: May Day of Week: Correct Attention: Focused, Sustained Focused Attention: Appears intact Sustained Attention: Impaired Memory: Impaired Memory Impairment: Decreased short term memory, Storage deficit (immediate: 5/5 with repetition x3; delayed: 0/5; with cues: 5/5) Awareness: Impaired Awareness Impairment: Emergent impairment Problem Solving Impairment: Verbal complex (Money: 1/3; time: 0/1) Executive Function: Sequencing, Technical brewer:  (clock drawaing: 4/4) Sequencing Impairment: Verbal complex Organizing: Impaired Organizing Impairment: Verbal complex (backward digit span: 1/2) [employer] Scales of Cognitive Functioning: Purposeful/appropriate Cognition Arousal/Alertness: Awake/alert Behavior During Therapy: Flat affect Overall Cognitive Status: Impaired/Different from baseline Area of Impairment: Memory, Safety/judgement, Awareness, Problem solving Orientation Level: Disoriented to Current Attention Level: Sustained Memory: Decreased short-term memory Following Commands: Follows one step commands with increased time Safety/Judgement: Decreased awareness of deficits Awareness: Emergent Problem Solving: Requires verbal cues, Requires tactile cues General Comments: reports feeling "depressed" described it was related to not being able to walk and move about the way she was before   Blood pressure (!) 134/59, pulse 72, temperature 97.7 F (36.5 C), temperature source Oral, resp. rate 14, height '5\' 1"'$  (1.549 m), weight 54.4 kg, SpO2 95 %. Physical Exam  Results for  orders placed or performed during the hospital encounter of 05/13/22 (from the past 48 hour(s))  Basic metabolic panel     Status: Abnormal   Collection Time: 05/15/22  8:34 AM  Result Value Ref Range   Sodium 138 135 - 145 mmol/L   Potassium 4.3 3.5 - 5.1 mmol/L   Chloride 109 98 - 111 mmol/L   CO2 23 22 - 32 mmol/L   Glucose, Bld 102 (H) 70 - 99 mg/dL    Comment: Glucose reference range applies only to samples taken after fasting for at least 8 hours.   BUN 23 8 - 23 mg/dL   Creatinine, Ser 1.73 (H) 0.44 - 1.00 mg/dL   Calcium 8.9 8.9 - 10.3 mg/dL   GFR, Estimated 27 (L) >60 mL/min    Comment: (NOTE) Calculated using the CKD-EPI Creatinine Equation (2021)    Anion gap 6 5 - 15    Comment: Performed at [facility] [address], [place], [place] [address]   MR CERVICAL SPINE WO CONTRAST  Result Date: 05/16/2022 CLINICAL DATA:  Initial evaluation for spinal stenosis. EXAM: MRI CERVICAL SPINE WITHOUT CONTRAST TECHNIQUE: Multiplanar, multisequence MR imaging of  the cervical spine was performed. No intravenous contrast was administered. COMPARISON:  CT from 05/13/2022. FINDINGS: Alignment: Straightening with reversal of the normal cervical lordosis. 2 mm anterolisthesis of C2 on C3, 3 mm retrolisthesis of C3 on C4, with 2 mm retrolisthesis of C4 on C5. Findings likely chronic and degenerative. Vertebrae: Vertebral body height maintained without acute or chronic fracture. Bone marrow signal intensity within normal limits. No discrete or worrisome osseous lesions. No abnormal marrow edema. Cord: Normal signal and morphology. No convincing cord signal changes on this mildly motion degraded exam. Posterior Fossa, vertebral arteries, paraspinal tissues: Chronic microvascular ischemic disease noted within the pons. Visualized brain otherwise unremarkable. Craniocervical junction normal. Mild edema noted within the posterior paraspinous soft tissues of the neck at the level of C4-5, suspected to  reflect mild soft tissue injury related to recent fall. Paraspinous soft tissues demonstrate no other acute finding. Normal flow voids seen within the vertebral arteries bilaterally. Upon review of prior CT, note is made of a thin hairline fracture involving the left occipital calvarium extending towards the jugular foramen (series 3, image 16 on prior head CT). Disc levels: C2-C3: Anterolisthesis. No significant disc bulge. Left-sided facet arthrosis. No significant spinal stenosis. Foramina remain patent. C3-C4: Retrolisthesis with degenerative intervertebral disc space narrowing. Broad posterior disc osteophyte flattens and effaces the ventral thecal sac. Superimposed facet and ligament flavum hypertrophy. Secondary cord flattening without definite cord signal changes. Severe spinal stenosis, with the thecal sac measuring 6 mm in AP diameter at its most narrow point. Severe bilateral C4 foraminal stenosis. C4-C5: Moderate degenerative intervertebral disc space narrowing with diffuse disc osteophyte complex. Broad posterior component flattens and partially faces the ventral thecal sac. Superimposed facet and ligament flavum hypertrophy. Resultant moderate to severe spinal stenosis. Mild cord flattening without cord signal changes. Moderate to severe left with moderate right C5 foraminal stenosis. C5-C6: Degenerative intervertebral disc space narrowing with diffuse disc osteophyte complex. Broad posterior component flattens and partially effaces the ventral thecal sac, eccentric to the left. Mild cord flattening without cord signal changes. Mild spinal stenosis. Moderate right with mild-to-moderate left C6 foraminal narrowing. C6-C7: Degenerative intervertebral disc space narrowing with diffuse disc bulge. Left greater than right uncovertebral spurring. Bilateral facet hypertrophy. No significant spinal stenosis. Mild left C7 foraminal narrowing. Right neural foramen remains patent. C7-T1: Negative interspace.  Right worse than left facet hypertrophy. No spinal stenosis. Mild right C8 foraminal narrowing. Left neural foramen is patent. Visualized upper thoracic spine demonstrates no significant finding. IMPRESSION: 1. Mild edema within the posterior paraspinous soft tissues of the upper cervical spine, likely mild soft tissue injury given the history of recent trauma. 2. No other acute abnormality within the cervical spine. 3. Degenerative spondylosis at C3-4 and C4-5 with resultant moderate to severe spinal stenosis, most pronounced at C3-4. Secondary cord flattening without cord signal changes. Associated moderate to severe bilateral C4 through C6 foraminal narrowing as above. 4. Upon review of prior CT, note is made of a thin hairline fracture involving the left occipital calvarium extending towards the jugular foramen. Electronically Signed   By: Jeannine Boga M.D.   On: 05/16/2022 03:44   DG CHEST PORT 1 VIEW  Result Date: 05/16/2022 CLINICAL DATA:  Rib fractures. EXAM: PORTABLE CHEST 1 VIEW COMPARISON:  05/13/2022 and older exams. FINDINGS: Cardiac silhouette is normal in size and configuration. No mediastinal or hilar masses. Clear lungs.  No convincing pleural effusion and no pneumothorax. No evidence of rib fracture. Specifically, the suspected fracture of the right  lateral second rib on shoulder radiographs from 05/13/2022 is not evident. No bone lesions. Stable changes from right breast surgery. IMPRESSION: 1. No active cardiopulmonary disease. 2. No evidence of a rib fracture. Electronically Signed   By: Lajean Manes M.D.   On: 05/16/2022 10:29      Blood pressure (!) 134/59, pulse 72, temperature 97.7 F (36.5 C), temperature source Oral, resp. rate 14, height '5\' 1"'$  (1.549 m), weight 54.4 kg, SpO2 95 %.  Medical Problem List and Plan: 1. Functional deficits secondary to ***  -patient may *** shower  -ELOS/Goals: *** 2.  Antithrombotics: -DVT/anticoagulation:  Pharmaceutical: Lovenox  started pm 06/02   -antiplatelet therapy: N/a due to bleed.  3. Pain Management:  Tylenol QID,  4. Mood: LCSW to follow for evaluation and support.    -antipsychotic agents: N/A 5. Neuropsych: This patient is capable of making decisions on her own behalf. 6. Skin/Wound Care: Routine pressure relief measures.  7. Fluids/Electrolytes/Nutrition: Monitor I/O. Check CMET on Monday 8.  TBI w/SAH/SDH: On Keppra bid X 7 days per NS 9. L occipital calvarium Fx: Soft collar at all times with outpatient follow up w/Dr. Annette Stable 10. R- 2nd rib Fx: Encourage IS 11. Severe cervical stenosis: Neuropathy/myelopathy due to fall-->gabapentin added 06/02 12. CKD stage IV: Followed by Dr. Theador Hawthorne Baseline SCr- --monitor with serial checks. Continue sodium bicarb 13. H/o UGIB/Iron deficiency anemia/HH: Add iron supplement 14. TURBT 04/16/2022 for invasive high grade bladder CA: h/o dysuria/frequency --was referred for palliative XRT by Dr. Tammi Klippel.  15. Anxiety d/o: Stable on Cymbalta and Xanax PTA.  16. 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     ***  Bary Leriche, PA-C 05/16/2022

## 2022-05-16 NOTE — Progress Notes (Signed)
Physical Therapy Treatment Patient Details Name: Jane Andrews MRN: [id number] DOB: [birth year] Today's Date: 05/16/2022   History of Present Illness 88 y.o. female presents to [facility] hospital on 05/13/2022 after sustaining a fall. 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 Pt also with R 2nd rib fx. PMH includes anxiety, depression, afib, breast CA, HTN, GIB, GERD.    PT Comments    Pt tolerates treatment well with increased ambulation distances. Pt with improvement in transfer quality with verbal cues for stance width and hand placement however she demonstrates difficulty retaining these cues for future attempts. Pt continues to demonstrate ataxic gait and reports numbness in BLE with burning sensation in feet at the end of session. Pt will benefit from continued acute PT services in an effort to improve balance and reduce falls risk. PT continues to recommend AIR admission.  Recommendations for follow up therapy are one component of a multi-disciplinary discharge planning process, led by the attending physician.  Recommendations may be updated based on patient status, additional functional criteria and insurance authorization.  Follow Up Recommendations  Acute inpatient rehab (3hours/day)     Assistance Recommended at Discharge Intermittent Supervision/Assistance  Patient can return home with the following A lot of help with walking and/or transfers;A lot of help with bathing/dressing/bathroom;Assistance with cooking/housework;Direct supervision/assist for medications management;Direct supervision/assist for financial management;Assist for transportation;Help with stairs or ramp for entrance   Equipment Recommendations  Rolling walker (2 wheels);BSC/3in1    Recommendations for Other Services       Precautions / Restrictions Precautions Precautions: Fall;Cervical Precaution Booklet Issued: No Precaution Comments: verbal  review of neck precautions, will need reinforcement Required Braces or Orthoses: Cervical Brace Cervical Brace: Soft collar (no collar present, order noted after session. PT provides education on soft collar use once delivered by ortho tech) Restrictions Weight Bearing Restrictions: No Other Position/Activity Restrictions: 2nd rib fx     Mobility  Bed Mobility Overal bed mobility: Needs Assistance Bed Mobility: Rolling, Sidelying to Sit, Sit to Supine Rolling: Min guard Sidelying to sit: Min assist   Sit to supine: Min guard        Transfers Overall transfer level: Needs assistance Equipment used: Rolling walker (2 wheels) Transfers: Sit to/from Stand, Bed to chair/wheelchair/BSC Sit to Stand: Min assist, Mod assist   Step pivot transfers: Max assist       General transfer comment: pt reporting impaired sensation of feet, often scissoring with mobility and with leftward lean    Ambulation/Gait Ambulation/Gait assistance: Max assist Gait Distance (Feet): 6 Feet (6' x 2 trials) Assistive device: Rolling walker (2 wheels) Gait Pattern/deviations: Step-to pattern, Scissoring, Ataxic Gait velocity: reduced Gait velocity interpretation: <1.31 ft/sec, indicative of household ambulator   General Gait Details: pt with left lateral lean, often with LLE crossing midline to right. Pt with some improvement with cues to look downward at feet to provide visual input due to LE sensation deficits.   Stairs             Wheelchair Mobility    Modified Rankin (Stroke Patients Only)       Balance Overall balance assessment: Needs assistance Sitting-balance support: No upper extremity supported, Feet supported Sitting balance-Leahy Scale: Fair     Standing balance support: Bilateral upper extremity supported, Reliant on assistive device for balance Standing balance-Leahy Scale: Poor Standing balance comment: min-maxA, variable based on stance width  Cognition Arousal/Alertness: Awake/alert Behavior During Therapy: WFL for tasks assessed/performed Overall Cognitive Status: Impaired/Different from baseline Area of Impairment: Memory, Problem solving, Safety/judgement               Rancho Levels of Cognitive Functioning [place] [employer] Scales of Cognitive Functioning: Purposeful/appropriate     Memory: Decreased recall of precautions, Decreased short-term memory Following Commands: Follows one step commands consistently Safety/Judgement: Decreased awareness of safety, Decreased awareness of deficits Awareness: Emergent       [place] [employer] Scales of Cognitive Functioning: Purposeful/appropriate    Exercises      General Comments General comments (skin integrity, edema, etc.): VSS on RA, pt reports lightheadedness with supine to sit, BP stable. Pt later reports dizziness and room spinnine when transitioning from sitting to supine, will benefit from further vestibular assessment this admission      Pertinent Vitals/Pain Pain Assessment Pain Assessment: No/denies pain    Home Living                          Prior Function            PT Goals (current goals can now be found in the care plan section) Acute Rehab PT Goals Patient Stated Goal: to return to independence Progress towards PT goals: Progressing toward goals    Frequency    Min 3X/week      PT Plan Current plan remains appropriate    Co-evaluation              AM-PAC PT "6 Clicks" Mobility   Outcome Measure  Help needed turning from your back to your side while in a flat bed without using bedrails?: A Little Help needed moving from lying on your back to sitting on the side of a flat bed without using bedrails?: A Little Help needed moving to and from a bed to a chair (including a wheelchair)?: A Lot Help needed standing up from a chair using your arms (e.g., wheelchair or bedside chair)?: A Lot Help  needed to walk in hospital room?: Total Help needed climbing 3-5 steps with a railing? : Total 6 Click Score: 12    End of Session Equipment Utilized During Treatment:  (soft collar order noted after session, collar not present during session. RN to call ortho tech.) Activity Tolerance: Patient tolerated treatment well Patient left: in bed;with call bell/phone within reach;with bed alarm set Nurse Communication: Mobility status PT Visit Diagnosis: Other abnormalities of gait and mobility (R26.89);Other symptoms and signs involving the nervous system (R29.898)     Time: 2703-5009 PT Time Calculation (min) (ACUTE ONLY): 29 min  Charges:  $Gait Training: 8-22 mins $Therapeutic Activity: 8-22 mins                     Zenaida Niece, PT, DPT Acute Rehabilitation Office [place] Jaleel Allen 05/16/2022, 3:26 PM

## 2022-05-16 NOTE — Progress Notes (Signed)
Patient states that she is depressed.  She is worried that she is not going to walk again.  The patient continues to complain of numbness and tingling in both upper and lower extremities.  Although she has good motor strength she has difficulty controlling her legs when attempting to walk.  She is awake and alert.  She is oriented and appropriate.  Her motor examination reveals some mild weakness of her grip and intrinsic function bilaterally.  She has good motor strength in both upper and lower extremities otherwise.  Sensory exam with patchy distal sensory loss in both upper and lower extremities.  I reviewed patient's recent MRI scan of her cervical spine.  This demonstrates evidence of extensive chronic appearing degenerative disease.  The patient has evidence of significant disc space collapse with degenerative retrolisthesis of C3 on C4 with severe stenosis.  She has perhaps a very mild amount of edema with her cord at this level.  At C4-5 she has moderately severe stenosis secondary to spondylosis.  The remainder of her cervical spine shows disc degeneration with some spondylitic change but no other areas of significant stenosis.  Patient is status post fall with a small left frontal cortical contusion with associated subarachnoid hemorrhage.  She has baseline severe cervical spondylosis with stenosis and with this fall has developed worsening cervical myelopathy.  I would like to see how this progresses over the next week or 2.  Given her advanced age I am not anxious to rush into surgery.  She may be mobilized ad lib.Marland Kitchen

## 2022-05-16 NOTE — Progress Notes (Signed)
Inpatient Rehabilitation Admission Medication Review by a Pharmacist  A complete drug regimen review was completed for this patient to identify any potential clinically significant medication issues.  High Risk Drug Classes Is patient taking? Indication by Medication  Antipsychotic Yes Compazine prn N/V  Anticoagulant Yes Lovenox for VTE ppx  Antibiotic No   Opioid Yes Tramadol prn pain  Antiplatelet No   Hypoglycemics/insulin No   Vasoactive Medication Yes Metoprolol XL for BP  Chemotherapy No   Other Yes Alprazolam prn anxiety Cymbalta for mood Keppra for seizure ppx Levothyroxine for low thyroid Methocarbamol for spasms  Gabapentin for neuropathic pain  Mirabegron for incontinence  Sodium bicarb for replacement     Type of Medication Issue Identified Description of Issue Recommendation(s)  Drug Interaction(s) (clinically significant)     Duplicate Therapy     Allergy     No Medication Administration End Date     Incorrect Dose     Additional Drug Therapy Needed     Significant med changes from prior encounter (inform family/care partners about these prior to discharge).    Other       Clinically significant medication issues were identified that warrant physician communication and completion of prescribed/recommended actions by midnight of the next day:  No  Pharmacist comments: None  Time spent performing this drug regimen review (minutes):  20 minutes  Thank you Anette Guarneri, PharmD 05/17/20

## 2022-05-16 NOTE — Discharge Summary (Signed)
Patient ID: MCKYNLIE VANDERSLICE [id number] 1925-09-01 88 y.o.  Admit date: 05/13/2022 Discharge date: 05/16/2022  Admitting Diagnosis: GLF TBI, bilateral frontal subarachnoid hemorrhage, right temporal lobe intracerebral contusion and a left tentorium subdural R 2nd rib FX  Discharge Diagnosis Patient Active Problem List   Diagnosis Date Noted   SAH (subarachnoid hemorrhage) ([place]) 05/13/2022   TBI (traumatic brain injury) ([place]) 05/13/2022   Bladder mass 04/16/2022   Hyperkalemia 04/09/2022   Symptomatic anemia 02/11/2022   SIRS (systemic inflammatory response syndrome) ([place]) 02/10/2022   Bilateral external ear infections 02/10/2022   Sinusitis 02/10/2022   Nephrolithiasis 01/07/2022   Vulvar irritation 12/20/2021   Burning with urination 12/20/2021   Symptoms of urinary tract infection 12/20/2021   Diarrhea 03/05/2021   Scoliosis 06/26/2020   Thrombocytopenia ([place]) 06/22/2020   Aspiration pneumonia ([place]) 06/22/2020   Chronic constipation 06/21/2020   Post-menopausal osteoporosis 06/21/2020   Essential tremor 06/21/2020   Vitamin B 12 deficiency 06/21/2020   Major depression, recurrent, chronic ([place]) 06/21/2020   Status post total replacement of right hip    AKI (acute kidney injury) ([place])    Postoperative anemia due to acute blood loss    Closed right hip fracture ([facility]) 06/14/2020   IDA (iron deficiency anemia) 02/28/2020   Atrial fibrillation with RVR ([place]) 12/25/2018   GERD (gastroesophageal reflux disease) 12/25/2018   Chronic cholecystitis s/p lap cholecystectomy 06/25/2018 06/18/2018   Vagal reaction 04/12/2016   Abdominal pain 09/19/2014   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 Earnie Larsson, [place]  Reason for Admission: 88yo F lives alone and fell while walking from 1 room to another today.  No loss of consciousness.  She is uncertain why she fell.  She was evaluated at [facility] and found to have traumatic brain injury including bilateral frontal subarachnoid hemorrhage, right temporal lobe intracerebral contusion and a left tentorium subdural.  She was also found to have right second rib fracture.  I accepted her in transfer for for admission to the trauma service.  She gets dizzy when she moves her head.  She also complains of tingling in her whole body.  GCS 15.  Procedures none  Hospital Course:  GLF   TBI, bilateral frontal subarachnoid hemorrhage, right temporal lobe intracerebral contusion and a left tentorium subdural  NSGY c/s, no repeat head CT, keppra x7d, monitor clinical exam, SLP eval recommending CIR. Given numbness in hands and feet, NSGY recommended MRI of c-spine.  This revealed mild edema of the posterior paraspinous soft tissues likely related to soft tissue injury from recent trauma along with some moderate to severe spinal stenosis at C3-4 C4-5 which does not appear acute.  No acute intervention warranted.  L occipital calvarium fracture  Delayed finding when radiologist reviewed CT on 6/1 after MRI. Reviewed with NS - non-op mgmt with soft collar and OP F/U  R 2nd rib FX  pain control, IS/pulm toilet  H/o newly dx bladder cancer  Scheduled for nephro appt 6/8  and onc appt 6/9 with Dr. Tammi Klippel. Dr. Tammi Klippel notified of admission and possible scheduling conflict.  She was seen by therapies who recommended CIR  I did not participate in this patient's care.  All history was obtained from the  chart.  Medications: Per CIR    Follow-up Information     Earnie Larsson, MD Follow up in 1 month(s).   Specialty: Neurosurgery Why: for head and neck injuries Contact information: [address] [place] [address] [phone]         Alexis Frock, MD Follow up on 05/22/2022.   Specialty: Urology Contact information: [place] [address] [phone]         Redmond School, MD Follow up.   Specialty: Internal Medicine Why: As needed Contact information: [address] [place] [address] [phone]                 Signed: Henreitta Cea, [facility] 05/16/2022, 3:52 PM Please see Amion for pager number during day hours 7:00am-4:30pm, 7-11:30am on Weekends

## 2022-05-16 NOTE — Progress Notes (Signed)
Orthopedic Tech Progress Note Patient Details:  Jane Andrews 08-07-1925 [id number]  Ortho Devices Type of Ortho Device: Soft collar Ortho Device/Splint Interventions: Ordered, Adjustment, Application   Post Interventions Patient Tolerated: Well Instructions Provided: Care of device, Adjustment of device  Jane Andrews 05/16/2022, 4:16 PM

## 2022-05-16 NOTE — Progress Notes (Addendum)
[facility] Progress Note     Subjective: CC:  NAEO. Nausea slightly better. Having intermittent HA, no HA currently. Per patient she still has weakness/numbness in hands and feet, unchanged from yesterday.    Objective: Vital signs in last 24 hours: Temp:  [97.7 F (36.5 C)-98.6 F (37 C)] 97.7 F (36.5 C) (06/02 0810) Pulse Rate:  [67-76] 72 (06/02 0810) Resp:  [14-19] 14 (06/02 0810) BP: (123-145)/(59-83) 134/59 (06/02 0810) SpO2:  [93 %-97 %] 95 % (06/02 0810) Last BM Date :  (PTA)  Intake/Output from previous day: 06/01 0701 - 06/02 0700 In: 720 [P.O.:720] Out: 1200 [Urine:1200] Intake/Output this shift: No intake/output data recorded.  PE: Gen:  Alert, NAD, pleasant and cooperative Card:  Regular rate and rhythm, pedal pulses 2+ BL, no pedal edema  Pulm:  Normal effort, clear to auscultation bilaterally Abd: Soft, non-tender, non-distended, bowel sounds present in all 4 quadrants, no HSM Skin: warm and dry, no rashes; dressings over medial and lateral ankles bilaterally.  Psych: A&Ox4 Neuro: no focal deficits. No facial drorp. Decreased grip strength RUE 4/5, expresses numbness of her R hand and both feet - no tingling. Finger to nose test done -- normal on left. Right finger touched upper lip.   Lab Results:  Recent Labs    05/13/22 1652 05/14/22 0513  WBC 13.4* 9.4  HGB 9.7* 9.4*  HCT 30.5* 28.3*  PLT 183 183   BMET Recent Labs    05/14/22 0513 05/15/22 0834  NA 137 138  K 4.2 4.3  CL 108 109  CO2 23 23  GLUCOSE 115* 102*  BUN 24* 23  CREATININE 1.61* 1.73*  CALCIUM 9.2 8.9   PT/INR No results for input(s): LABPROT, INR in the last 72 hours. CMP     Component Value Date/Time   NA 138 05/15/2022 0834   NA 141 04/23/2022 1243   K 4.3 05/15/2022 0834   CL 109 05/15/2022 0834   CO2 23 05/15/2022 0834   GLUCOSE 102 (H) 05/15/2022 0834   BUN 23 05/15/2022 0834   BUN 37 (H) 04/23/2022 1243   CREATININE 1.73 (H) 05/15/2022 0834    CREATININE 1.31 (H) 09/23/2018 0952   CALCIUM 8.9 05/15/2022 0834   PROT 6.7 05/13/2022 1652   PROT 6.7 04/23/2022 1243   ALBUMIN 3.9 05/13/2022 1652   ALBUMIN 4.3 04/23/2022 1243   AST 20 05/13/2022 1652   ALT 12 05/13/2022 1652   ALKPHOS 75 05/13/2022 1652   BILITOT 0.3 05/13/2022 1652   BILITOT 0.2 04/23/2022 1243   GFRNONAA 27 (L) 05/15/2022 0834   GFRAA 37 (L) 08/30/2020 1255   Lipase     Component Value Date/Time   LIPASE 45 03/01/2018 1348       Studies/Results: MR CERVICAL SPINE WO CONTRAST  Result Date: 05/16/2022 CLINICAL DATA:  Initial evaluation for spinal stenosis. EXAM: MRI CERVICAL SPINE WITHOUT CONTRAST TECHNIQUE: Multiplanar, multisequence MR imaging of the cervical spine was performed. No intravenous contrast was administered. COMPARISON:  CT from 05/13/2022. FINDINGS: Alignment: Straightening with reversal of the normal cervical lordosis. 2 mm anterolisthesis of C2 on C3, 3 mm retrolisthesis of C3 on C4, with 2 mm retrolisthesis of C4 on C5. Findings likely chronic and degenerative. Vertebrae: Vertebral body height maintained without acute or chronic fracture. Bone marrow signal intensity within normal limits. No discrete or worrisome osseous lesions. No abnormal marrow edema. Cord: Normal signal and morphology. No convincing cord signal changes on this mildly motion degraded exam. Posterior Fossa, vertebral arteries,  paraspinal tissues: Chronic microvascular ischemic disease noted within the pons. Visualized brain otherwise unremarkable. Craniocervical junction normal. Mild edema noted within the posterior paraspinous soft tissues of the neck at the level of C4-5, suspected to reflect mild soft tissue injury related to recent fall. Paraspinous soft tissues demonstrate no other acute finding. Normal flow voids seen within the vertebral arteries bilaterally. Upon review of prior CT, note is made of a thin hairline fracture involving the left occipital calvarium extending  towards the jugular foramen (series 3, image 16 on prior head CT). Disc levels: C2-C3: Anterolisthesis. No significant disc bulge. Left-sided facet arthrosis. No significant spinal stenosis. Foramina remain patent. C3-C4: Retrolisthesis with degenerative intervertebral disc space narrowing. Broad posterior disc osteophyte flattens and effaces the ventral thecal sac. Superimposed facet and ligament flavum hypertrophy. Secondary cord flattening without definite cord signal changes. Severe spinal stenosis, with the thecal sac measuring 6 mm in AP diameter at its most narrow point. Severe bilateral C4 foraminal stenosis. C4-C5: Moderate degenerative intervertebral disc space narrowing with diffuse disc osteophyte complex. Broad posterior component flattens and partially faces the ventral thecal sac. Superimposed facet and ligament flavum hypertrophy. Resultant moderate to severe spinal stenosis. Mild cord flattening without cord signal changes. Moderate to severe left with moderate right C5 foraminal stenosis. C5-C6: Degenerative intervertebral disc space narrowing with diffuse disc osteophyte complex. Broad posterior component flattens and partially effaces the ventral thecal sac, eccentric to the left. Mild cord flattening without cord signal changes. Mild spinal stenosis. Moderate right with mild-to-moderate left C6 foraminal narrowing. C6-C7: Degenerative intervertebral disc space narrowing with diffuse disc bulge. Left greater than right uncovertebral spurring. Bilateral facet hypertrophy. No significant spinal stenosis. Mild left C7 foraminal narrowing. Right neural foramen remains patent. C7-T1: Negative interspace. Right worse than left facet hypertrophy. No spinal stenosis. Mild right C8 foraminal narrowing. Left neural foramen is patent. Visualized upper thoracic spine demonstrates no significant finding. IMPRESSION: 1. Mild edema within the posterior paraspinous soft tissues of the upper cervical spine,  likely mild soft tissue injury given the history of recent trauma. 2. No other acute abnormality within the cervical spine. 3. Degenerative spondylosis at C3-4 and C4-5 with resultant moderate to severe spinal stenosis, most pronounced at C3-4. Secondary cord flattening without cord signal changes. Associated moderate to severe bilateral C4 through C6 foraminal narrowing as above. 4. Upon review of prior CT, note is made of a thin hairline fracture involving the left occipital calvarium extending towards the jugular foramen. Electronically Signed   By: Jeannine Boga M.D.   On: 05/16/2022 03:44    Anti-infectives: Anti-infectives (From admission, onward)    None      Assessment/Plan    GLF   TBI, bilateral frontal subarachnoid hemorrhage, right temporal lobe intracerebral contusion and a left tentorium subdural - NSGY c/s, no repeat head CT, keppra x7d, monitor clinical exam, SLP eval recommending CIR. Given numbness in hands and feet I have asked NS to evaluate her -- they ordered cervical MRI which does not show an acute injury. L occipital calvarium fracture - delayed finding when radiologist reviewed CT yesterday 6/1 after MRI. Reviewed with NS - non-op mgmt with soft collar and OP F/U R 2nd rib FX - pain control, IS/pulm toilet H/o newly dx bladder cancer - scheduled for nephro appt 6/8 and onc appt 6/9 with Dr. Tammi Klippel. Dr. Tammi Klippel notified of admission and possible scheduling conflict. FEN - Reg DVT - SCDs, LMWH to start this morning Dispo - 4NP , PT/OT/SLP, CIR - medically  stable for discharge to CIR pending insurance auth and bed availability   Addendum 6/2: will start low dose gabapentin 100 mg QHS for numbness/tingling in hands/feet  LOS: 3 days   I reviewed nursing notes, Consultant NS notes, last 24 h vitals and pain scores, last 48 h intake and output, last 24 h labs and trends, and last 24 h imaging results.    Obie Dredge, PA-C [facility] Please  see Amion for pager number during day hours 7:00am-4:30pm

## 2022-05-17 ENCOUNTER — Inpatient Hospital Stay (HOSPITAL_COMMUNITY)
Admission: RE | Admit: 2022-05-17 | Discharge: 2022-06-07 | DRG: 945 | Disposition: A | Discharge status: Home Health | Payer: Medicare Other | Source: Intra-hospital | Attending: Physical Medicine & Rehabilitation | Admitting: Physical Medicine & Rehabilitation

## 2022-05-17 ENCOUNTER — Other Ambulatory Visit: Payer: Self-pay

## 2022-05-17 ENCOUNTER — Encounter (HOSPITAL_COMMUNITY): Payer: Self-pay | Admitting: Physical Medicine & Rehabilitation

## 2022-05-17 DIAGNOSIS — F419 Anxiety disorder, unspecified: Secondary | ICD-10-CM | POA: Diagnosis present

## 2022-05-17 DIAGNOSIS — K912 Postsurgical malabsorption, not elsewhere classified: Secondary | ICD-10-CM | POA: Diagnosis present

## 2022-05-17 DIAGNOSIS — W19XXXD Unspecified fall, subsequent encounter: Secondary | ICD-10-CM | POA: Diagnosis present

## 2022-05-17 DIAGNOSIS — K219 Gastro-esophageal reflux disease without esophagitis: Secondary | ICD-10-CM | POA: Diagnosis present

## 2022-05-17 DIAGNOSIS — H8110 Benign paroxysmal vertigo, unspecified ear: Secondary | ICD-10-CM | POA: Diagnosis present

## 2022-05-17 DIAGNOSIS — D509 Iron deficiency anemia, unspecified: Secondary | ICD-10-CM | POA: Diagnosis present

## 2022-05-17 DIAGNOSIS — F32A Depression, unspecified: Secondary | ICD-10-CM | POA: Diagnosis present

## 2022-05-17 DIAGNOSIS — R5381 Other malaise: Secondary | ICD-10-CM | POA: Diagnosis present

## 2022-05-17 DIAGNOSIS — Z79890 Hormone replacement therapy (postmenopausal): Secondary | ICD-10-CM | POA: Diagnosis not present

## 2022-05-17 DIAGNOSIS — S066XAD Traumatic subarachnoid hemorrhage with loss of consciousness status unknown, subsequent encounter: Secondary | ICD-10-CM

## 2022-05-17 DIAGNOSIS — Z510 Encounter for antineoplastic radiation therapy: Secondary | ICD-10-CM | POA: Diagnosis not present

## 2022-05-17 DIAGNOSIS — S069XAA Unspecified intracranial injury with loss of consciousness status unknown, initial encounter: Secondary | ICD-10-CM | POA: Diagnosis present

## 2022-05-17 DIAGNOSIS — R4189 Other symptoms and signs involving cognitive functions and awareness: Secondary | ICD-10-CM | POA: Diagnosis present

## 2022-05-17 DIAGNOSIS — M545 Low back pain, unspecified: Secondary | ICD-10-CM | POA: Diagnosis not present

## 2022-05-17 DIAGNOSIS — S14129A Central cord syndrome at unspecified level of cervical spinal cord, initial encounter: Secondary | ICD-10-CM

## 2022-05-17 DIAGNOSIS — S14129S Central cord syndrome at unspecified level of cervical spinal cord, sequela: Secondary | ICD-10-CM | POA: Diagnosis not present

## 2022-05-17 DIAGNOSIS — X58XXXD Exposure to other specified factors, subsequent encounter: Secondary | ICD-10-CM | POA: Diagnosis present

## 2022-05-17 DIAGNOSIS — E039 Hypothyroidism, unspecified: Secondary | ICD-10-CM | POA: Diagnosis present

## 2022-05-17 DIAGNOSIS — C670 Malignant neoplasm of trigone of bladder: Secondary | ICD-10-CM | POA: Diagnosis not present

## 2022-05-17 DIAGNOSIS — N184 Chronic kidney disease, stage 4 (severe): Secondary | ICD-10-CM | POA: Diagnosis present

## 2022-05-17 DIAGNOSIS — I129 Hypertensive chronic kidney disease with stage 1 through stage 4 chronic kidney disease, or unspecified chronic kidney disease: Secondary | ICD-10-CM | POA: Diagnosis present

## 2022-05-17 DIAGNOSIS — E559 Vitamin D deficiency, unspecified: Secondary | ICD-10-CM | POA: Diagnosis present

## 2022-05-17 DIAGNOSIS — S14129D Central cord syndrome at unspecified level of cervical spinal cord, subsequent encounter: Principal | ICD-10-CM

## 2022-05-17 DIAGNOSIS — C679 Malignant neoplasm of bladder, unspecified: Secondary | ICD-10-CM | POA: Diagnosis not present

## 2022-05-17 DIAGNOSIS — S2231XD Fracture of one rib, right side, subsequent encounter for fracture with routine healing: Secondary | ICD-10-CM | POA: Diagnosis not present

## 2022-05-17 DIAGNOSIS — Z66 Do not resuscitate: Secondary | ICD-10-CM | POA: Diagnosis present

## 2022-05-17 DIAGNOSIS — S069X0S Unspecified intracranial injury without loss of consciousness, sequela: Secondary | ICD-10-CM | POA: Diagnosis not present

## 2022-05-17 DIAGNOSIS — S069XAS Unspecified intracranial injury with loss of consciousness status unknown, sequela: Secondary | ICD-10-CM | POA: Diagnosis not present

## 2022-05-17 DIAGNOSIS — E1122 Type 2 diabetes mellitus with diabetic chronic kidney disease: Secondary | ICD-10-CM | POA: Diagnosis present

## 2022-05-17 DIAGNOSIS — S0003XA Contusion of scalp, initial encounter: Secondary | ICD-10-CM | POA: Diagnosis not present

## 2022-05-17 DIAGNOSIS — M792 Neuralgia and neuritis, unspecified: Secondary | ICD-10-CM | POA: Diagnosis not present

## 2022-05-17 DIAGNOSIS — S066X0A Traumatic subarachnoid hemorrhage without loss of consciousness, initial encounter: Secondary | ICD-10-CM | POA: Diagnosis not present

## 2022-05-17 DIAGNOSIS — I609 Nontraumatic subarachnoid hemorrhage, unspecified: Secondary | ICD-10-CM | POA: Diagnosis not present

## 2022-05-17 DIAGNOSIS — S065XAD Traumatic subdural hemorrhage with loss of consciousness status unknown, subsequent encounter: Secondary | ICD-10-CM

## 2022-05-17 DIAGNOSIS — Z79899 Other long term (current) drug therapy: Secondary | ICD-10-CM

## 2022-05-17 DIAGNOSIS — G729 Myopathy, unspecified: Secondary | ICD-10-CM | POA: Diagnosis present

## 2022-05-17 DIAGNOSIS — K529 Noninfective gastroenteritis and colitis, unspecified: Secondary | ICD-10-CM | POA: Diagnosis present

## 2022-05-17 DIAGNOSIS — J309 Allergic rhinitis, unspecified: Secondary | ICD-10-CM | POA: Diagnosis present

## 2022-05-17 DIAGNOSIS — Z87820 Personal history of traumatic brain injury: Secondary | ICD-10-CM | POA: Diagnosis not present

## 2022-05-17 DIAGNOSIS — F329 Major depressive disorder, single episode, unspecified: Secondary | ICD-10-CM | POA: Diagnosis not present

## 2022-05-17 DIAGNOSIS — M542 Cervicalgia: Secondary | ICD-10-CM | POA: Diagnosis not present

## 2022-05-17 DIAGNOSIS — Z7409 Other reduced mobility: Secondary | ICD-10-CM | POA: Diagnosis not present

## 2022-05-17 DIAGNOSIS — M4802 Spinal stenosis, cervical region: Secondary | ICD-10-CM | POA: Diagnosis present

## 2022-05-17 DIAGNOSIS — Z043 Encounter for examination and observation following other accident: Secondary | ICD-10-CM | POA: Diagnosis not present

## 2022-05-17 DIAGNOSIS — Z8551 Personal history of malignant neoplasm of bladder: Secondary | ICD-10-CM

## 2022-05-17 DIAGNOSIS — R32 Unspecified urinary incontinence: Secondary | ICD-10-CM | POA: Diagnosis present

## 2022-05-17 DIAGNOSIS — S0211HD Other fracture of occiput, left side, subsequent encounter for fracture with routine healing: Secondary | ICD-10-CM

## 2022-05-17 DIAGNOSIS — N189 Chronic kidney disease, unspecified: Secondary | ICD-10-CM | POA: Diagnosis not present

## 2022-05-17 MED ORDER — PROCHLORPERAZINE MALEATE 5 MG PO TABS
5.0000 mg | ORAL_TABLET | Freq: Four times a day (QID) | ORAL | Status: DC | PRN
  Administered 2022-06-03 – 2022-06-06 (×3): 10 mg via ORAL
  Filled 2022-05-17 (×3): qty 2

## 2022-05-17 MED ORDER — METHOCARBAMOL 750 MG PO TABS
750.0000 mg | ORAL_TABLET | Freq: Four times a day (QID) | ORAL | Status: DC
  Administered 2022-05-17 – 2022-06-07 (×80): 750 mg via ORAL
  Filled 2022-05-17 (×82): qty 1

## 2022-05-17 MED ORDER — DIPHENHYDRAMINE HCL 12.5 MG/5ML PO ELIX: 12.5000 mg | ORAL_SOLUTION | Freq: Four times a day (QID) | ORAL | Status: DC | PRN

## 2022-05-17 MED ORDER — ACETAMINOPHEN 325 MG PO TABS
650.0000 mg | ORAL_TABLET | Freq: Three times a day (TID) | ORAL | Status: DC
  Administered 2022-05-17 – 2022-05-18 (×2): 650 mg via ORAL
  Administered 2022-05-18: 325 mg via ORAL
  Administered 2022-05-18 – 2022-06-04 (×67): 650 mg via ORAL
  Filled 2022-05-17 (×71): qty 2

## 2022-05-17 MED ORDER — POLYETHYLENE GLYCOL 3350 17 G PO PACK: 17.0000 g | PACK | Freq: Every day | ORAL | Status: DC | PRN

## 2022-05-17 MED ORDER — POLYSACCHARIDE IRON COMPLEX 150 MG PO CAPS
150.0000 mg | ORAL_CAPSULE | Freq: Every day | ORAL | Status: DC
  Administered 2022-05-18 – 2022-06-05 (×19): 150 mg via ORAL
  Filled 2022-05-17 (×21): qty 1

## 2022-05-17 MED ORDER — TRAZODONE HCL 50 MG PO TABS: 25.0000 mg | ORAL_TABLET | Freq: Every evening | ORAL | Status: DC | PRN

## 2022-05-17 MED ORDER — LEVETIRACETAM 500 MG PO TABS
500.0000 mg | ORAL_TABLET | Freq: Two times a day (BID) | ORAL | Status: AC
  Administered 2022-05-17 – 2022-05-21 (×8): 500 mg via ORAL
  Filled 2022-05-17 (×8): qty 1

## 2022-05-17 MED ORDER — SODIUM BICARBONATE 650 MG PO TABS
650.0000 mg | ORAL_TABLET | Freq: Two times a day (BID) | ORAL | Status: DC
  Administered 2022-05-17 – 2022-06-07 (×40): 650 mg via ORAL
  Filled 2022-05-17 (×41): qty 1

## 2022-05-17 MED ORDER — ALPRAZOLAM 0.5 MG PO TABS
0.5000 mg | ORAL_TABLET | Freq: Every evening | ORAL | Status: DC | PRN
Start: 2022-05-17 — End: 2022-05-20
  Administered 2022-05-17 – 2022-05-19 (×3): 0.5 mg via ORAL
  Filled 2022-05-17 (×3): qty 1

## 2022-05-17 MED ORDER — GUAIFENESIN-DM 100-10 MG/5ML PO SYRP: 5.0000 mL | ORAL_SOLUTION | Freq: Four times a day (QID) | ORAL | Status: DC | PRN

## 2022-05-17 MED ORDER — BISACODYL 10 MG RE SUPP: 10.0000 mg | Freq: Every day | RECTAL | Status: DC | PRN

## 2022-05-17 MED ORDER — LEVOTHYROXINE SODIUM 75 MCG PO TABS
75.0000 ug | ORAL_TABLET | Freq: Every day | ORAL | Status: DC
  Administered 2022-05-18 – 2022-06-07 (×20): 75 ug via ORAL
  Filled 2022-05-17 (×21): qty 1

## 2022-05-17 MED ORDER — MIRABEGRON ER 25 MG PO TB24
25.0000 mg | ORAL_TABLET | Freq: Every day | ORAL | Status: DC
  Administered 2022-05-18 – 2022-06-07 (×20): 25 mg via ORAL
  Filled 2022-05-17 (×22): qty 1

## 2022-05-17 MED ORDER — ENOXAPARIN SODIUM 30 MG/0.3ML IJ SOSY
30.0000 mg | PREFILLED_SYRINGE | INTRAMUSCULAR | Status: DC
  Administered 2022-05-18 – 2022-06-07 (×20): 30 mg via SUBCUTANEOUS
  Filled 2022-05-17 (×20): qty 0.3

## 2022-05-17 MED ORDER — DULOXETINE HCL 60 MG PO CPEP
90.0000 mg | ORAL_CAPSULE | Freq: Every day | ORAL | Status: DC
  Administered 2022-05-18 – 2022-06-07 (×20): 90 mg via ORAL
  Filled 2022-05-17 (×21): qty 1

## 2022-05-17 MED ORDER — PROCHLORPERAZINE 25 MG RE SUPP
12.5000 mg | Freq: Four times a day (QID) | RECTAL | Status: DC | PRN
Start: 2022-05-17 — End: 2022-06-07

## 2022-05-17 MED ORDER — ACETAMINOPHEN 325 MG PO TABS
325.0000 mg | ORAL_TABLET | ORAL | Status: DC | PRN
  Administered 2022-05-18: 650 mg via ORAL
  Filled 2022-05-17: qty 2

## 2022-05-17 MED ORDER — FLEET ENEMA 7-19 GM/118ML RE ENEM
1.0000 | ENEMA | Freq: Once | RECTAL | Status: DC | PRN
Start: 2022-05-17 — End: 2022-06-07

## 2022-05-17 MED ORDER — GABAPENTIN 100 MG PO CAPS
100.0000 mg | ORAL_CAPSULE | Freq: Every day | ORAL | Status: DC
  Administered 2022-05-17 – 2022-06-03 (×18): 100 mg via ORAL
  Filled 2022-05-17 (×18): qty 1

## 2022-05-17 MED ORDER — PROCHLORPERAZINE EDISYLATE 10 MG/2ML IJ SOLN: 5.0000 mg | Freq: Four times a day (QID) | INTRAMUSCULAR | Status: DC | PRN

## 2022-05-17 MED ORDER — METOPROLOL SUCCINATE ER 25 MG PO TB24
25.0000 mg | ORAL_TABLET | Freq: Every day | ORAL | Status: DC
  Administered 2022-05-18 – 2022-06-07 (×20): 25 mg via ORAL
  Filled 2022-05-17 (×20): qty 1

## 2022-05-17 MED ORDER — ALUM & MAG HYDROXIDE-SIMETH 200-200-20 MG/5ML PO SUSP
30.0000 mL | ORAL | Status: DC | PRN
  Administered 2022-05-21 – 2022-05-22 (×2): 30 mL via ORAL
  Filled 2022-05-17 (×3): qty 30

## 2022-05-17 MED ORDER — TRAMADOL HCL 50 MG PO TABS
50.0000 mg | ORAL_TABLET | Freq: Four times a day (QID) | ORAL | Status: DC | PRN
  Administered 2022-05-18 – 2022-06-06 (×8): 50 mg via ORAL
  Filled 2022-05-17 (×8): qty 1

## 2022-05-17 NOTE — H&P (Signed)
Physical Medicine and Rehabilitation Admission H&P    Chief Complaint  Patient presents with   Functional deficits due to fall with TBI/trauma.     HPI: Jane Andrews is a 88 year old female with history of AF, R- MRM for breast cancer, CKD IV, R- staghorn calculus, TURBT (Dr. Felipa Eth) for bladder CA, abdominal adhesions, short gut syndrome who was admitted via [facility] on 05/13/22 after found on the floor by a neighbor with laceration on the back of her head. She was found to have right parieto-occipital scalp hematoma, small acute SAH overlying anterolateral  left frontal lobe and small SAH along anteroinferior B-fronal lobe with small hemorrhagic contusions right temporal lobe and acute SDH along left tentorium, cervical stenosis C3/4 with advanced DDD C3-C7, suspected fracture right lateral 2nd rib. Patient reported some HA as well as dizziness. Dr. Annette Stable evaluated patient and recommended conservative management as without neurologic symptoms.   She has had issues with bouts of lethargy reported numbness and tingling in bilateral hands and feet as well as sandpaper feeling in her hands. MRI C- spine ordered for work up on 06/02 to rule out nerve impingement given cervical stenosis. This revealed mild edema in posterior paraspinous soft tissue with thin hairline fracture involving left occipital calvarium extending towards jugular foramen and degenerative spondylosis C3/4 and C4/5 with moderate to severe spinal stenosis with flattening of cord (no signal changes).  C -collar recommended for support.  Palliative care consulted to discuss [place] and patient elected on DNR. Exam by Dr. Annette Stable revealed some mild weakness in grips/intrinsic function bilaterally with patchy sensory loss in BUE/BLE felt to be due to myelopathy secondary to fall in setting of severe cervical spondylosis and monitoring recommended.  She continues to be limited by pain, ataxic gait with left lean, decreased recall with  problems sequencing  as well as impaired sensation in bilateral feet. CIR recommended due to functional decline. She states she is very depressed regarding her current limitations.    Review of Systems  Constitutional:  Negative for fever.  HENT:  Negative for hearing loss.   Eyes:  Negative for blurred vision.  Respiratory:  Negative for cough.   Cardiovascular:  Negative for chest pain.  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  Genitourinary:  Negative for dysuria.  Musculoskeletal:  Positive for falls (has had problems with balance) and myalgias.  Neurological:  Positive for sensory change (sandpaper/painful sensation bilateral hands. Unable to feel her feet) and weakness.  Psychiatric/Behavioral:  The patient is nervous/anxious and has insomnia.     Past Medical History:  Diagnosis Date   Abdominal adhesions 1994   Allergic rhinitis    Anemia    Anxiety and depression    Aortic stenosis    Arthritis    Atrial fibrillation ([place]) 10/2012   Associated with severe anemia and esophageal pill impaction   Breast carcinoma ([facility])    Right mastectomy   Cholelithiasis    Essential hypertension    Gastroesophageal reflux disease    Hiatal hernia   History of blood transfusion    Hypothyroidism    Low back pain    Malabsorption    Short gut syndrome following small bowel resection surgery x2   Nephrolithiasis 2004   Painless hematuria   Short gut syndrome    Bowel resection , 2004   Upper GI bleed 2004   Multiple episodes of melena-? due to gastritis or adverse drug effect (nonsteroidals, small bowel ulceration with Fosamax);  caused by Pepto-Bismol during one Emergency Department evaluation   Past Surgical History:  Procedure Laterality Date   ABDOMINAL HYSTERECTOMY     emergency s/p delivery   ABDOMINAL HYSTERECTOMY  1960   massive gynecologic bleeding   BOWEL RESECTION     Resulting short gut syndrome   CHOLECYSTECTOMY N/A  06/25/2018   Procedure: LAPAROSCOPIC CHOLECYSTECTOMY WITH INTRAOPERATIVE CHOLANGIOGRAM;  Surgeon: Armandina Gemma, MD;  Location: [facility];  Service: General;  Laterality: N/A;   COLONOSCOPY W/ POLYPECTOMY  2005   Lipoma; diverticulosis   COLONOSCOPY WITH ESOPHAGOGASTRODUODENOSCOPY (EGD)  11/22/2012   [facility]   CYSTOSCOPY W/ RETROGRADES Bilateral 04/16/2022   Procedure: CYSTOSCOPY;  Surgeon: Primus Bravo., MD;  Location: [facility];  Service: Urology;  Laterality: Bilateral;   ESOPHAGOGASTRODUODENOSCOPY (EGD) WITH PROPOFOL N/A 04/04/2022   Procedure: ESOPHAGOGASTRODUODENOSCOPY (EGD) WITH PROPOFOL;  Surgeon: Rogene Houston, MD;  Location: [facility];  Service: Endoscopy;  Laterality: N/A;  210   HIP ARTHROPLASTY Right 06/15/2020   Procedure: ANTERIOR ARTHROPLASTY BIPOLAR HIP (HEMIARTHROPLASTY);  Surgeon: Mcarthur Rossetti, MD;  Location: [facility];  Service: Orthopedics;  Laterality: Right;   LAPAROSCOPIC LYSIS OF ADHESIONS  1965   s/p adhesions   MASTECTOMY  right breast   MASTECTOMY     Carcinoma of the breast; right   TRANSURETHRAL RESECTION OF BLADDER TUMOR N/A 04/16/2022   Procedure: TRANSURETHRAL RESECTION OF BLADDER TUMOR (TURBT);  Surgeon: Primus Bravo., MD;  Location: [facility];  Service: Urology;  Laterality: N/A;   UPPER GASTROINTESTINAL ENDOSCOPY      Family History  Problem Relation Age of Onset   Anuerysm Father    Rheum arthritis Sister    Healthy Sister    COPD Sister    Healthy Brother    Cancer Other    Colon cancer Neg Hx     Social History:  reports that she has quit smoking. Her smoking use included cigarettes. She has a 30.00 pack-year smoking history. She has never used smokeless tobacco. She reports that she does not drink alcohol and does not use drugs.   Allergies  Allergen Reactions   Codeine Nausea And Vomiting    Medications Prior to Admission  Medication Sig Dispense Refill   acetaminophen (TYLENOL) 500 MG tablet Take 500 mg by mouth daily as  needed for headache (pain).     ALPRAZolam (XANAX) 0.5 MG tablet Take 0.5-1 mg by mouth See admin instructions. Take 1-2 tablets (0.5-1 mg) daily at bedtime, take 1 tablet (0.5 mg) during the day as needed for anxiety     Ascorbic Acid (VITAMIN C PO) Take 1 tablet by mouth every morning.     Cholecalciferol (VITAMIN D3 PO) Take 1 capsule by mouth every morning.     cyanocobalamin (,VITAMIN B-12,) 1000 MCG/ML injection Inject 1,000 mcg into the muscle every 30 (thirty) days.     diphenoxylate-atropine (LOMOTIL) 2.5-0.025 MG tablet Take 1 tablet by mouth daily as needed for diarrhea or loose stools.     DULoxetine (CYMBALTA) 30 MG capsule Take 30 mg by mouth every morning. Take with a 60 mg capsule for a total of 90 mg daily     DULoxetine (CYMBALTA) 60 MG capsule Take 60 mg by mouth every morning. Take with a 30 mg capsule for a total of 90 mg daily     fluticasone (FLONASE) 50 MCG/ACT nasal spray Place 1 spray into both nostrils 2 (two) times daily. (Patient taking differently: Place 1 spray into both nostrils daily as needed for  rhinitis.) 16 g 0   levothyroxine (SYNTHROID) 75 MCG tablet Take 1 tablet (75 mcg total) by mouth daily before breakfast. (Patient taking differently: Take 75 mcg by mouth every morning.) 30 tablet 0   Meth-Hyo-M Bl-Na Phos-Ph Sal (URIBEL) 118 MG CAPS Take 1 capsule (118 mg total) by mouth 4 (four) times daily as needed (pain with urination). 20 capsule 5   metoprolol succinate (TOPROL-XL) 25 MG 24 hr tablet Take 1 tablet (25 mg total) by mouth daily. (Patient taking differently: Take 25 mg by mouth every morning.) 30 tablet 0   mirabegron ER (MYRBETRIQ) 25 MG TB24 tablet Take 1 tablet (25 mg total) by mouth daily. (Patient taking differently: Take 25 mg by mouth every morning.) 28 tablet 0   Multiple Vitamins-Iron (MULTIVITAMINS WITH IRON) TABS tablet Take 1 tablet by mouth every morning.     pantoprazole (PROTONIX) 40 MG tablet Take 1 tablet (40 mg total) by mouth 2 (two)  times daily. (Patient taking differently: Take 40 mg by mouth every morning.) 60 tablet 0   Probiotic Product (PROBIOTIC PO) Take 1 tablet by mouth every morning.     sodium bicarbonate 650 MG tablet Take 650 mg by mouth 2 (two) times daily.     traMADol (ULTRAM) 50 MG tablet Take 1 tablet (50 mg total) by mouth every 6 (six) hours as needed. (Patient taking differently: Take 50 mg by mouth See admin instructions. Take one tablet (50 mg) by mouth every morning, take one tablet (50 mg) during the day as needed for pain) 15 tablet 0      Home: Home Living Family/patient expects to be discharged to:: Private residence Living Arrangements: Alone Available Help at Discharge: Family, Available 24 hours/day Type of Home: House Home Access: Stairs to enter [employer] of Steps: 3 Entrance Stairs-Rails: Right, Left Home Layout: Two level Alternate Level Stairs-Number of Steps: 20 Alternate Level Stairs-Rails: Right Bathroom Shower/Tub: Chiropodist: Standard Bathroom Accessibility: Yes Home Equipment: Conservation officer, nature (2 wheels), [employer] - single point, Civil engineer, contracting, Grab bars - tub/shower  Lives With: Alone   Functional History: Prior Function Prior Level of Function : Independent/Modified Independent, Driving Mobility Comments: household and limited community ambulation without AD ADLs Comments: independent  Functional Status:  Mobility: Bed Mobility Overal bed mobility: Needs Assistance Bed Mobility: Rolling, Sidelying to Sit, Sit to Supine Rolling: Min guard Sidelying to sit: Min assist Supine to sit: Min assist, HOB elevated Sit to supine: Min guard General bed mobility comments: able to roll r for bed pan removal at beginning of session. cues for hand placement on bed rails and to use ues to pull, assistance required to bring trunk upright, assistance to initiate scooting hips to eob-able to sit with no lob noted Transfers Overall transfer level: Needs  assistance Equipment used: Rolling walker (2 wheels) Transfers: Sit to/from Stand, Bed to chair/wheelchair/BSC Sit to Stand: Min assist, Mod assist Bed to/from chair/wheelchair/BSC transfer type:: Step pivot Stand pivot transfers: Max assist Step pivot transfers: Max assist General transfer comment: pt reporting impaired sensation of feet, often scissoring with mobility and with leftward lean Ambulation/Gait Ambulation/Gait assistance: Max assist Gait Distance (Feet): 6 Feet (6' x 2 trials) Assistive device: Rolling walker (2 wheels) Gait Pattern/deviations: Step-to pattern, Scissoring, Ataxic General Gait Details: pt with left lateral lean, often with LLE crossing midline to right. Pt with some improvement with cues to look downward at feet to provide visual input due to LE sensation deficits. Gait velocity: reduced Gait velocity interpretation: <  1.31 ft/sec, indicative of household ambulator    ADL: ADL Overall ADL's : Needs assistance/impaired Eating/Feeding: Minimal assistance, With adaptive utensils Eating/Feeding Details (indicate cue type and reason): built up handles on spoon to assist with self-feeding - will need red tubing Grooming: Moderate assistance, Sitting, Wash/dry face Grooming Details (indicate cue type and reason): able to bring hand to face with grooming items, initially stating "I can't do it" Upper Body Bathing: Maximal assistance Lower Body Bathing: Maximal assistance Upper Body Dressing : Moderate assistance, Sitting Lower Body Dressing: Total assistance, Sitting/lateral leans Lower Body Dressing Details (indicate cue type and reason): to don socks Toilet Transfer: Moderate assistance, Cueing for safety, Cueing for sequencing, Ambulation, Rolling walker (2 wheels), Maximal assistance Toilet Transfer Details (indicate cue type and reason): simulated with transfer from eob with steps to recliner-supine on bed pan upon arrival-total a for peri care Toileting-  Clothing Manipulation and Hygiene: Maximal assistance, Sit to/from stand Functional mobility during ADLs: Moderate assistance, Maximal assistance, Cueing for safety, Cueing for sequencing, Rolling walker (2 wheels) General ADL Comments: Pt with overall feeling of unwell, "tingling" all over and decreased strength and grasp in BUE-described that her body felt like "sand paper" but also described that she "couldnt feel anything"  Cognition: Cognition Overall Cognitive Status: Impaired/Different from baseline Arousal/Alertness: Awake/alert Orientation Level: Oriented X4 Year: 2023 Month: May Day of Week: Correct Attention: Focused, Sustained Focused Attention: Appears intact Sustained Attention: Impaired Memory: Impaired Memory Impairment: Decreased short term memory, Storage deficit (immediate: 5/5 with repetition x3; delayed: 0/5; with cues: 5/5) Awareness: Impaired Awareness Impairment: Emergent impairment Problem Solving Impairment: Verbal complex (Money: 1/3; time: 0/1) Executive Function: Sequencing, Technical brewer:  (clock drawaing: 4/4) Sequencing Impairment: Verbal complex Organizing: Impaired Organizing Impairment: Verbal complex (backward digit span: 1/2) [employer] Scales of Cognitive Functioning: Purposeful/appropriate Cognition Arousal/Alertness: Awake/alert Behavior During Therapy: WFL for tasks assessed/performed Overall Cognitive Status: Impaired/Different from baseline Area of Impairment: Memory, Problem solving, Safety/judgement Orientation Level: Disoriented to Current Attention Level: Sustained Memory: Decreased recall of precautions, Decreased short-term memory Following Commands: Follows one step commands consistently Safety/Judgement: Decreased awareness of safety, Decreased awareness of deficits Awareness: Emergent Problem Solving: Requires verbal cues, Requires tactile cues General Comments: reports feeling "depressed" described it was  related to not being able to walk and move about the way she was before   Blood pressure 140/62, pulse 76, temperature 98 F (36.7 C), temperature source Oral, resp. rate 17, height '5\' 1"'$  (1.549 m), weight 54.4 kg, SpO2 97 %. Physical Exam Gen: no distress, normal appearing HEENT: oral mucosa pink and moist, NCAT Cardio: Reg rate Chest: normal effort, normal rate of breathing Abd: soft, non-distended Ext: no edema Psych: pleasant, normal affect Skin: intact Neuro: Alert and oriented x3. Decreased sensation in bilateral hands. 4/5 strength in upper extremities, 5/5 strength in lower extremities.  Musculoskeletal: Cervical soft collar in place.   No results found for this or any previous visit (from the past 48 hour(s)).  MR CERVICAL SPINE WO CONTRAST  Result Date: 05/16/2022 CLINICAL DATA:  Initial evaluation for spinal stenosis. EXAM: MRI CERVICAL SPINE WITHOUT CONTRAST TECHNIQUE: Multiplanar, multisequence MR imaging of the cervical spine was performed. No intravenous contrast was administered. COMPARISON:  CT from 05/13/2022. FINDINGS: Alignment: Straightening with reversal of the normal cervical lordosis. 2 mm anterolisthesis of C2 on C3, 3 mm retrolisthesis of C3 on C4, with 2 mm retrolisthesis of C4 on C5. Findings likely chronic and degenerative. Vertebrae: Vertebral body height maintained without acute or chronic  fracture. Bone marrow signal intensity within normal limits. No discrete or worrisome osseous lesions. No abnormal marrow edema. Cord: Normal signal and morphology. No convincing cord signal changes on this mildly motion degraded exam. Posterior Fossa, vertebral arteries, paraspinal tissues: Chronic microvascular ischemic disease noted within the pons. Visualized brain otherwise unremarkable. Craniocervical junction normal. Mild edema noted within the posterior paraspinous soft tissues of the neck at the level of C4-5, suspected to reflect mild soft tissue injury related to recent  fall. Paraspinous soft tissues demonstrate no other acute finding. Normal flow voids seen within the vertebral arteries bilaterally. Upon review of prior CT, note is made of a thin hairline fracture involving the left occipital calvarium extending towards the jugular foramen (series 3, image 16 on prior head CT). Disc levels: C2-C3: Anterolisthesis. No significant disc bulge. Left-sided facet arthrosis. No significant spinal stenosis. Foramina remain patent. C3-C4: Retrolisthesis with degenerative intervertebral disc space narrowing. Broad posterior disc osteophyte flattens and effaces the ventral thecal sac. Superimposed facet and ligament flavum hypertrophy. Secondary cord flattening without definite cord signal changes. Severe spinal stenosis, with the thecal sac measuring 6 mm in AP diameter at its most narrow point. Severe bilateral C4 foraminal stenosis. C4-C5: Moderate degenerative intervertebral disc space narrowing with diffuse disc osteophyte complex. Broad posterior component flattens and partially faces the ventral thecal sac. Superimposed facet and ligament flavum hypertrophy. Resultant moderate to severe spinal stenosis. Mild cord flattening without cord signal changes. Moderate to severe left with moderate right C5 foraminal stenosis. C5-C6: Degenerative intervertebral disc space narrowing with diffuse disc osteophyte complex. Broad posterior component flattens and partially effaces the ventral thecal sac, eccentric to the left. Mild cord flattening without cord signal changes. Mild spinal stenosis. Moderate right with mild-to-moderate left C6 foraminal narrowing. C6-C7: Degenerative intervertebral disc space narrowing with diffuse disc bulge. Left greater than right uncovertebral spurring. Bilateral facet hypertrophy. No significant spinal stenosis. Mild left C7 foraminal narrowing. Right neural foramen remains patent. C7-T1: Negative interspace. Right worse than left facet hypertrophy. No spinal  stenosis. Mild right C8 foraminal narrowing. Left neural foramen is patent. Visualized upper thoracic spine demonstrates no significant finding. IMPRESSION: 1. Mild edema within the posterior paraspinous soft tissues of the upper cervical spine, likely mild soft tissue injury given the history of recent trauma. 2. No other acute abnormality within the cervical spine. 3. Degenerative spondylosis at C3-4 and C4-5 with resultant moderate to severe spinal stenosis, most pronounced at C3-4. Secondary cord flattening without cord signal changes. Associated moderate to severe bilateral C4 through C6 foraminal narrowing as above. 4. Upon review of prior CT, note is made of a thin hairline fracture involving the left occipital calvarium extending towards the jugular foramen. Electronically Signed   By: Jeannine Boga M.D.   On: 05/16/2022 03:44   DG CHEST PORT 1 VIEW  Result Date: 05/16/2022 CLINICAL DATA:  Rib fractures. EXAM: PORTABLE CHEST 1 VIEW COMPARISON:  05/13/2022 and older exams. FINDINGS: Cardiac silhouette is normal in size and configuration. No mediastinal or hilar masses. Clear lungs.  No convincing pleural effusion and no pneumothorax. No evidence of rib fracture. Specifically, the suspected fracture of the right lateral second rib on shoulder radiographs from 05/13/2022 is not evident. No bone lesions. Stable changes from right breast surgery. IMPRESSION: 1. No active cardiopulmonary disease. 2. No evidence of a rib fracture. Electronically Signed   By: Lajean Manes M.D.   On: 05/16/2022 10:29      Blood pressure 140/62, pulse 76, temperature 98 F (36.7  C), temperature source Oral, resp. rate 17, height '5\' 1"'$  (1.549 m), weight 54.4 kg, SpO2 97 %.  Medical Problem List and Plan: 1. Functional deficits secondary to Central cord syndrome  -patient may shower  -ELOS/Goals: 25 days  Admit to CIR  Order grounds pass 2.  Antithrombotics: -DVT/anticoagulation:  Pharmaceutical: Lovenox started  pm 06/02   -antiplatelet therapy: N/a due to bleed.  3. Pain Management:  Tylenol QID,  4. Mood: LCSW to follow for evaluation and support.    -antipsychotic agents: N/A 5. Neuropsych: This patient is capable of making decisions on her own behalf. 6. Skin/Wound Care: Routine pressure relief measures.  7. Fluids/Electrolytes/Nutrition: Monitor I/O. Check CMET on Monday 8.  TBI w/SAH/SDH: On Keppra bid X 7 days per NS 9. L occipital calvarium Fx: Soft collar at all times with outpatient follow up w/Dr. Annette Stable 10. R- 2nd rib Fx: Encourage IS 11. Severe cervical stenosis: Neuropathy/myelopathy due to fall-->gabapentin added 06/02 12. CKD stage IV: Followed by Dr. Theador Hawthorne Baseline SCr- --monitor with serial checks. Continue sodium bicarb 13. H/o UGIB/Iron deficiency anemia/HH: Add iron supplement. B12 reviewed and is stable.  14. TURBT 04/16/2022 for invasive high grade bladder CA: h/o dysuria/frequency --was referred for palliative XRT by Dr. Tammi Klippel.  She has an appointment at [facility] this Friday at 10:30am for evaluation for radiation and it is very important to her and her niece that she be able to attend this. Would be great if SW can schedule transport to and from this appointment.  15. Anxiety d/o: Stable on Cymbalta and Xanax PTA.  16. 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  41. Screening for vitamin D deficiency: check vitamin D level tomorrow.   I have personally performed a face to face diagnostic evaluation, including, but not limited to relevant history and physical exam findings, of this patient and developed relevant assessment and plan.  Additionally, I have reviewed and concur with the physician assistant's documentation above.  Leeroy Cha, MD, ABPMR   Bary Leriche, PA-C

## 2022-05-17 NOTE — Progress Notes (Signed)
Pt discharge to CIR and report called off to nurse Kennyth Lose. Pt IV saline locked and intact during transfer. Pt transported off unit via bed with belongings to the side to [id number] by NT's. Care plan and education completed. Delia Heady RN

## 2022-05-17 NOTE — Progress Notes (Signed)
Subjective: Patient reports patient stable reports numbness in her hands weakness in her arms  Objective: Vital signs in last 24 hours: Temp:  [97.8 F (36.6 C)-98.7 F (37.1 C)] 98 F (36.7 C) (06/03 0710) Pulse Rate:  [65-76] 65 (06/03 0710) Resp:  [15-18] 17 (06/03 0710) BP: (118-142)/(56-71) 140/62 (06/03 0710) SpO2:  [95 %-97 %] 97 % (06/03 0710)  Intake/Output from previous day: 06/02 0701 - 06/03 0700 In: 840 [P.O.:840] Out: 1050 [Urine:1050] Intake/Output this shift: No intake/output data recorded.  4-5 strength upper and lower extremities soft collar in place  Lab Results: No results for input(s): WBC, HGB, HCT, PLT in the last 72 hours. BMET Recent Labs    05/15/22 0834  NA 138  K 4.3  CL 109  CO2 23  GLUCOSE 102*  BUN 23  CREATININE 1.73*  CALCIUM 8.9    Studies/Results: MR CERVICAL SPINE WO CONTRAST  Result Date: 05/16/2022 CLINICAL DATA:  Initial evaluation for spinal stenosis. EXAM: MRI CERVICAL SPINE WITHOUT CONTRAST TECHNIQUE: Multiplanar, multisequence MR imaging of the cervical spine was performed. No intravenous contrast was administered. COMPARISON:  CT from 05/13/2022. FINDINGS: Alignment: Straightening with reversal of the normal cervical lordosis. 2 mm anterolisthesis of C2 on C3, 3 mm retrolisthesis of C3 on C4, with 2 mm retrolisthesis of C4 on C5. Findings likely chronic and degenerative. Vertebrae: Vertebral body height maintained without acute or chronic fracture. Bone marrow signal intensity within normal limits. No discrete or worrisome osseous lesions. No abnormal marrow edema. Cord: Normal signal and morphology. No convincing cord signal changes on this mildly motion degraded exam. Posterior Fossa, vertebral arteries, paraspinal tissues: Chronic microvascular ischemic disease noted within the pons. Visualized brain otherwise unremarkable. Craniocervical junction normal. Mild edema noted within the posterior paraspinous soft tissues of the neck  at the level of C4-5, suspected to reflect mild soft tissue injury related to recent fall. Paraspinous soft tissues demonstrate no other acute finding. Normal flow voids seen within the vertebral arteries bilaterally. Upon review of prior CT, note is made of a thin hairline fracture involving the left occipital calvarium extending towards the jugular foramen (series 3, image 16 on prior head CT). Disc levels: C2-C3: Anterolisthesis. No significant disc bulge. Left-sided facet arthrosis. No significant spinal stenosis. Foramina remain patent. C3-C4: Retrolisthesis with degenerative intervertebral disc space narrowing. Broad posterior disc osteophyte flattens and effaces the ventral thecal sac. Superimposed facet and ligament flavum hypertrophy. Secondary cord flattening without definite cord signal changes. Severe spinal stenosis, with the thecal sac measuring 6 mm in AP diameter at its most narrow point. Severe bilateral C4 foraminal stenosis. C4-C5: Moderate degenerative intervertebral disc space narrowing with diffuse disc osteophyte complex. Broad posterior component flattens and partially faces the ventral thecal sac. Superimposed facet and ligament flavum hypertrophy. Resultant moderate to severe spinal stenosis. Mild cord flattening without cord signal changes. Moderate to severe left with moderate right C5 foraminal stenosis. C5-C6: Degenerative intervertebral disc space narrowing with diffuse disc osteophyte complex. Broad posterior component flattens and partially effaces the ventral thecal sac, eccentric to the left. Mild cord flattening without cord signal changes. Mild spinal stenosis. Moderate right with mild-to-moderate left C6 foraminal narrowing. C6-C7: Degenerative intervertebral disc space narrowing with diffuse disc bulge. Left greater than right uncovertebral spurring. Bilateral facet hypertrophy. No significant spinal stenosis. Mild left C7 foraminal narrowing. Right neural foramen remains  patent. C7-T1: Negative interspace. Right worse than left facet hypertrophy. No spinal stenosis. Mild right C8 foraminal narrowing. Left neural foramen is patent. Visualized upper  thoracic spine demonstrates no significant finding. IMPRESSION: 1. Mild edema within the posterior paraspinous soft tissues of the upper cervical spine, likely mild soft tissue injury given the history of recent trauma. 2. No other acute abnormality within the cervical spine. 3. Degenerative spondylosis at C3-4 and C4-5 with resultant moderate to severe spinal stenosis, most pronounced at C3-4. Secondary cord flattening without cord signal changes. Associated moderate to severe bilateral C4 through C6 foraminal narrowing as above. 4. Upon review of prior CT, note is made of a thin hairline fracture involving the left occipital calvarium extending towards the jugular foramen. Electronically Signed   By: Jeannine Boga M.D.   On: 05/16/2022 03:44   DG CHEST PORT 1 VIEW  Result Date: 05/16/2022 CLINICAL DATA:  Rib fractures. EXAM: PORTABLE CHEST 1 VIEW COMPARISON:  05/13/2022 and older exams. FINDINGS: Cardiac silhouette is normal in size and configuration. No mediastinal or hilar masses. Clear lungs.  No convincing pleural effusion and no pneumothorax. No evidence of rib fracture. Specifically, the suspected fracture of the right lateral second rib on shoulder radiographs from 05/13/2022 is not evident. No bone lesions. Stable changes from right breast surgery. IMPRESSION: 1. No active cardiopulmonary disease. 2. No evidence of a rib fracture. Electronically Signed   By: Lajean Manes M.D.   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  LOS: 4 days     Elaina Hoops 05/17/2022, 8:28 AM

## 2022-05-17 NOTE — Progress Notes (Signed)
Patient arrieved at 4. [name]&O 3-4. Denies pain at this time. Will endorse complete admit to on coming nurse 7 pm-7 am.

## 2022-05-17 NOTE — Progress Notes (Signed)
Inpatient Rehabilitation Admission Medication Review by a Pharmacist  A complete drug regimen review was completed for this patient to identify any potential clinically significant medication issues.  High Risk Drug Classes Is patient taking? Indication by Medication  Antipsychotic Yes Prn Compazine - n/v  Anticoagulant Yes Enoxaparin  - VTE prophylaxis  Antibiotic No   Opioid Yes Prn Tramadol - pain  Antiplatelet No   Hypoglycemics/insulin No   Vasoactive Medication Yes Metoprolol XL - blood pressure  Chemotherapy No   Other Yes Acetaminophen QID - pain Cymbalta - mood Gabapentin - neuropathic pain Methocarbamol - muscle relaxer Keppra - seizure prophylaxis (for total 7 days) Levothyroxine - thyroid supplement Mirabegron - incontinence Iron - supplement Sodium bicarbonate - replacement  Prn Alprazolam - anxiety Prn Trazodone - sleep     Type of Medication Issue Identified Description of Issue Recommendation(s)  Drug Interaction(s) (clinically significant)     Duplicate Therapy     Allergy     No Medication Administration End Date     Incorrect Dose  Enoxaparin 40 mg SQ q24h ordered on transfer. 30 mg SQ q24h begun 05/16/22. Adjusted to 30 mg SQ q24h for creatinine clearance < 30 ml/miin.  Additional Drug Therapy Needed     Significant med changes from prior encounter (inform family/care partners about these prior to discharge). Iron supplement is new on CIR.   Other  Has been off Protonix, MVI, Vitamin C, Vitamin D, Vitamin B-12, Probiotic, and prn Flonase, Lomotil and Uribell Resume as appropriate. Noted checking Vitamin D level in am.    Clinically significant medication issues were identified that warrant physician communication and completion of prescribed/recommended actions by midnight of the next day:  No  Pharmacist comments:  Keppra x 7 days total. 6 doses given on inpatient unit. Stop time for 8 more doses is in place.  Time spent performing this drug  regimen review (minutes):  [place], [place], [place] 05/17/2022 9:03 PM

## 2022-05-17 NOTE — Progress Notes (Signed)
Subjective/Chief Complaint: No complaints this morning   Objective: Vital signs in last 24 hours: Temp:  [97.8 F (36.6 C)-98.7 F (37.1 C)] 98 F (36.7 C) (06/03 0710) Pulse Rate:  [65-76] 65 (06/03 0710) Resp:  [15-18] 17 (06/03 0710) BP: (118-142)/(56-71) 140/62 (06/03 0710) SpO2:  [95 %-97 %] 97 % (06/03 0710) Last BM Date : 05/15/22  Intake/Output from previous day: 06/02 0701 - 06/03 0700 In: 840 [P.O.:840] Out: 1050 [Urine:1050] Intake/Output this shift: No intake/output data recorded.  Exam: Awake and alert, follows commands Neck soft collar in place Lungs clear CV RRR Abd soft, NT Neuro grossly intact  Lab Results:  No results for input(s): WBC, HGB, HCT, PLT in the last 72 hours. BMET Recent Labs    05/15/22 0834  NA 138  K 4.3  CL 109  CO2 23  GLUCOSE 102*  BUN 23  CREATININE 1.73*  CALCIUM 8.9   PT/INR No results for input(s): LABPROT, INR in the last 72 hours. ABG No results for input(s): PHART, HCO3 in the last 72 hours.  Invalid input(s): PCO2, PO2  Studies/Results: MR CERVICAL SPINE WO CONTRAST  Result Date: 05/16/2022 CLINICAL DATA:  Initial evaluation for spinal stenosis. EXAM: MRI CERVICAL SPINE WITHOUT CONTRAST TECHNIQUE: Multiplanar, multisequence MR imaging of the cervical spine was performed. No intravenous contrast was administered. COMPARISON:  CT from 05/13/2022. FINDINGS: Alignment: Straightening with reversal of the normal cervical lordosis. 2 mm anterolisthesis of C2 on C3, 3 mm retrolisthesis of C3 on C4, with 2 mm retrolisthesis of C4 on C5. Findings likely chronic and degenerative. Vertebrae: Vertebral body height maintained without acute or chronic fracture. Bone marrow signal intensity within normal limits. No discrete or worrisome osseous lesions. No abnormal marrow edema. Cord: Normal signal and morphology. No convincing cord signal changes on this mildly motion degraded exam. Posterior Fossa, vertebral arteries,  paraspinal tissues: Chronic microvascular ischemic disease noted within the pons. Visualized brain otherwise unremarkable. Craniocervical junction normal. Mild edema noted within the posterior paraspinous soft tissues of the neck at the level of C4-5, suspected to reflect mild soft tissue injury related to recent fall. Paraspinous soft tissues demonstrate no other acute finding. Normal flow voids seen within the vertebral arteries bilaterally. Upon review of prior CT, note is made of a thin hairline fracture involving the left occipital calvarium extending towards the jugular foramen (series 3, image 16 on prior head CT). Disc levels: C2-C3: Anterolisthesis. No significant disc bulge. Left-sided facet arthrosis. No significant spinal stenosis. Foramina remain patent. C3-C4: Retrolisthesis with degenerative intervertebral disc space narrowing. Broad posterior disc osteophyte flattens and effaces the ventral thecal sac. Superimposed facet and ligament flavum hypertrophy. Secondary cord flattening without definite cord signal changes. Severe spinal stenosis, with the thecal sac measuring 6 mm in AP diameter at its most narrow point. Severe bilateral C4 foraminal stenosis. C4-C5: Moderate degenerative intervertebral disc space narrowing with diffuse disc osteophyte complex. Broad posterior component flattens and partially faces the ventral thecal sac. Superimposed facet and ligament flavum hypertrophy. Resultant moderate to severe spinal stenosis. Mild cord flattening without cord signal changes. Moderate to severe left with moderate right C5 foraminal stenosis. C5-C6: Degenerative intervertebral disc space narrowing with diffuse disc osteophyte complex. Broad posterior component flattens and partially effaces the ventral thecal sac, eccentric to the left. Mild cord flattening without cord signal changes. Mild spinal stenosis. Moderate right with mild-to-moderate left C6 foraminal narrowing. C6-C7: Degenerative  intervertebral disc space narrowing with diffuse disc bulge. Left greater than right uncovertebral spurring. Bilateral  facet hypertrophy. No significant spinal stenosis. Mild left C7 foraminal narrowing. Right neural foramen remains patent. C7-T1: Negative interspace. Right worse than left facet hypertrophy. No spinal stenosis. Mild right C8 foraminal narrowing. Left neural foramen is patent. Visualized upper thoracic spine demonstrates no significant finding. IMPRESSION: 1. Mild edema within the posterior paraspinous soft tissues of the upper cervical spine, likely mild soft tissue injury given the history of recent trauma. 2. No other acute abnormality within the cervical spine. 3. Degenerative spondylosis at C3-4 and C4-5 with resultant moderate to severe spinal stenosis, most pronounced at C3-4. Secondary cord flattening without cord signal changes. Associated moderate to severe bilateral C4 through C6 foraminal narrowing as above. 4. Upon review of prior CT, note is made of a thin hairline fracture involving the left occipital calvarium extending towards the jugular foramen. Electronically Signed   By: Jeannine Boga M.D.   On: 05/16/2022 03:44   DG CHEST PORT 1 VIEW  Result Date: 05/16/2022 CLINICAL DATA:  Rib fractures. EXAM: PORTABLE CHEST 1 VIEW COMPARISON:  05/13/2022 and older exams. FINDINGS: Cardiac silhouette is normal in size and configuration. No mediastinal or hilar masses. Clear lungs.  No convincing pleural effusion and no pneumothorax. No evidence of rib fracture. Specifically, the suspected fracture of the right lateral second rib on shoulder radiographs from 05/13/2022 is not evident. No bone lesions. Stable changes from right breast surgery. IMPRESSION: 1. No active cardiopulmonary disease. 2. No evidence of a rib fracture. Electronically Signed   By: Lajean Manes M.D.   On: 05/16/2022 10:29    Anti-infectives: Anti-infectives (From admission, onward)    None        Assessment/Plan: GLF   TBI, bilateral frontal subarachnoid hemorrhage, right temporal lobe intracerebral contusion and a left tentorium subdural - NSGY c/s, no repeat head CT, keppra x7d, monitor clinical exam, SLP eval recommending CIR. Given numbness in hands and feet I have asked NS to evaluate her -- they ordered cervical MRI which does not show an acute injury. L occipital calvarium fracture - delayed finding when radiologist reviewed CT 6/1 after MRI. Reviewed with NS - non-op mgmt with soft collar and OP F/U R 2nd rib FX - pain control, IS/pulm toilet H/o newly dx bladder cancer - scheduled for nephro appt 6/8 and onc appt 6/9 with Dr. Tammi Klippel. Dr. Tammi Klippel notified of admission and possible scheduling conflict. FEN - Reg DVT - SCDs, LMWH t Dispo - 4NP , PT/OT/SLP, CIR - medically stable for discharge to CIR pending insurance auth and bed availability   No new changes.  To CIR when bed ready  Coralie Keens MD 05/17/2022

## 2022-05-17 NOTE — H&P (Signed)
Physical Medicine and Rehabilitation Admission H&P    Chief Complaint  Patient presents with   Functional deficits due to fall with TBI/trauma.     HPI: Jane Andrews. Beaston is a 88 year old female with history of AF, R- MRM for breast cancer, CKD IV, R- staghorn calculus, TURBT (Dr. Felipa Eth) for bladder CA, abdominal adhesions, short gut syndrome who was admitted via [facility] on 05/13/22 after found on the floor by a neighbor with laceration on the back of her head. She was found to have right parieto-occipital scalp hematoma, small acute SAH overlying anterolateral  left frontal lobe and small SAH along anteroinferior B-fronal lobe with small hemorrhagic contusions right temporal lobe and acute SDH along left tentorium, cervical stenosis C3/4 with advanced DDD C3-C7, suspected fracture right lateral 2nd rib. Patient reported some HA as well as dizziness. Dr. Annette Stable evaluated patient and recommended conservative management as without neurologic symptoms.   She has had issues with bouts of lethargy reported numbness and tingling in bilateral hands and feet as well as sandpaper feeling in her hands. MRI C- spine ordered for work up on 06/02 to rule out nerve impingement given cervical stenosis. This revealed mild edema in posterior paraspinous soft tissue with thin hairline fracture involving left occipital calvarium extending towards jugular foramen and degenerative spondylosis C3/4 and C4/5 with moderate to severe spinal stenosis with flattening of cord (no signal changes).  C -collar recommended for support.  Palliative care consulted to discuss [place] and patient elected on DNR. Exam by Dr. Annette Stable revealed some mild weakness in grips/intrinsic function bilaterally with patchy sensory loss in BUE/BLE felt to be due to myelopathy secondary to fall in setting of severe cervical spondylosis and monitoring recommended.  She continues to be limited by pain, ataxic gait with left lean, decreased recall with  problems sequencing  as well as impaired sensation in bilateral feet. CIR recommended due to functional decline. She states she is very depressed regarding her current limitations.    Review of Systems  Constitutional:  Negative for fever.  HENT:  Negative for hearing loss.   Eyes:  Negative for blurred vision.  Respiratory:  Negative for cough.   Cardiovascular:  Negative for chest pain.  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  Genitourinary:  Negative for dysuria.  Musculoskeletal:  Positive for falls (has had problems with balance) and myalgias.  Neurological:  Positive for sensory change (sandpaper/painful sensation bilateral hands. Unable to feel her feet) and weakness.  Psychiatric/Behavioral:  The patient is nervous/anxious and has insomnia.     Past Medical History:  Diagnosis Date   Abdominal adhesions 1994   Allergic rhinitis    Anemia    Anxiety and depression    Aortic stenosis    Arthritis    Atrial fibrillation ([place]) 10/2012   Associated with severe anemia and esophageal pill impaction   Breast carcinoma ([facility])    Right mastectomy   Cholelithiasis    Essential hypertension    Gastroesophageal reflux disease    Hiatal hernia   History of blood transfusion    Hypothyroidism    Low back pain    Malabsorption    Short gut syndrome following small bowel resection surgery x2   Nephrolithiasis 2004   Painless hematuria   Short gut syndrome    Bowel resection , 2004   Upper GI bleed 2004   Multiple episodes of melena-? due to gastritis or adverse drug effect (nonsteroidals, small bowel ulceration with Fosamax);  caused by Pepto-Bismol during one Emergency Department evaluation   Past Surgical History:  Procedure Laterality Date   ABDOMINAL HYSTERECTOMY     emergency s/p delivery   ABDOMINAL HYSTERECTOMY  1960   massive gynecologic bleeding   BOWEL RESECTION     Resulting short gut syndrome   CHOLECYSTECTOMY N/A  06/25/2018   Procedure: LAPAROSCOPIC CHOLECYSTECTOMY WITH INTRAOPERATIVE CHOLANGIOGRAM;  Surgeon: Armandina Gemma, MD;  Location: [facility];  Service: General;  Laterality: N/A;   COLONOSCOPY W/ POLYPECTOMY  2005   Lipoma; diverticulosis   COLONOSCOPY WITH ESOPHAGOGASTRODUODENOSCOPY (EGD)  11/22/2012   [facility]   CYSTOSCOPY W/ RETROGRADES Bilateral 04/16/2022   Procedure: CYSTOSCOPY;  Surgeon: Primus Bravo., MD;  Location: [facility];  Service: Urology;  Laterality: Bilateral;   ESOPHAGOGASTRODUODENOSCOPY (EGD) WITH PROPOFOL N/A 04/04/2022   Procedure: ESOPHAGOGASTRODUODENOSCOPY (EGD) WITH PROPOFOL;  Surgeon: Rogene Houston, MD;  Location: [facility];  Service: Endoscopy;  Laterality: N/A;  210   HIP ARTHROPLASTY Right 06/15/2020   Procedure: ANTERIOR ARTHROPLASTY BIPOLAR HIP (HEMIARTHROPLASTY);  Surgeon: Mcarthur Rossetti, MD;  Location: [facility];  Service: Orthopedics;  Laterality: Right;   LAPAROSCOPIC LYSIS OF ADHESIONS  1965   s/p adhesions   MASTECTOMY  right breast   MASTECTOMY     Carcinoma of the breast; right   TRANSURETHRAL RESECTION OF BLADDER TUMOR N/A 04/16/2022   Procedure: TRANSURETHRAL RESECTION OF BLADDER TUMOR (TURBT);  Surgeon: Primus Bravo., MD;  Location: [facility];  Service: Urology;  Laterality: N/A;   UPPER GASTROINTESTINAL ENDOSCOPY      Family History  Problem Relation Age of Onset   Anuerysm Father    Rheum arthritis Sister    Healthy Sister    COPD Sister    Healthy Brother    Cancer Other    Colon cancer Neg Hx     Social History:  reports that she has quit smoking. Her smoking use included cigarettes. She has a 30.00 pack-year smoking history. She has never used smokeless tobacco. She reports that she does not drink alcohol and does not use drugs.   Allergies  Allergen Reactions   Codeine Nausea And Vomiting    Medications Prior to Admission  Medication Sig Dispense Refill   acetaminophen (TYLENOL) 500 MG tablet Take 500 mg by mouth daily as  needed for headache (pain).     ALPRAZolam (XANAX) 0.5 MG tablet Take 0.5-1 mg by mouth See admin instructions. Take 1-2 tablets (0.5-1 mg) daily at bedtime, take 1 tablet (0.5 mg) during the day as needed for anxiety     Ascorbic Acid (VITAMIN C PO) Take 1 tablet by mouth every morning.     Cholecalciferol (VITAMIN D3 PO) Take 1 capsule by mouth every morning.     cyanocobalamin (,VITAMIN B-12,) 1000 MCG/ML injection Inject 1,000 mcg into the muscle every 30 (thirty) days.     diphenoxylate-atropine (LOMOTIL) 2.5-0.025 MG tablet Take 1 tablet by mouth daily as needed for diarrhea or loose stools.     DULoxetine (CYMBALTA) 30 MG capsule Take 30 mg by mouth every morning. Take with a 60 mg capsule for a total of 90 mg daily     DULoxetine (CYMBALTA) 60 MG capsule Take 60 mg by mouth every morning. Take with a 30 mg capsule for a total of 90 mg daily     fluticasone (FLONASE) 50 MCG/ACT nasal spray Place 1 spray into both nostrils 2 (two) times daily. (Patient taking differently: Place 1 spray into both nostrils daily as needed for  rhinitis.) 16 g 0   levothyroxine (SYNTHROID) 75 MCG tablet Take 1 tablet (75 mcg total) by mouth daily before breakfast. (Patient taking differently: Take 75 mcg by mouth every morning.) 30 tablet 0   Meth-Hyo-M Bl-Na Phos-Ph Sal (URIBEL) 118 MG CAPS Take 1 capsule (118 mg total) by mouth 4 (four) times daily as needed (pain with urination). 20 capsule 5   metoprolol succinate (TOPROL-XL) 25 MG 24 hr tablet Take 1 tablet (25 mg total) by mouth daily. (Patient taking differently: Take 25 mg by mouth every morning.) 30 tablet 0   mirabegron ER (MYRBETRIQ) 25 MG TB24 tablet Take 1 tablet (25 mg total) by mouth daily. (Patient taking differently: Take 25 mg by mouth every morning.) 28 tablet 0   Multiple Vitamins-Iron (MULTIVITAMINS WITH IRON) TABS tablet Take 1 tablet by mouth every morning.     pantoprazole (PROTONIX) 40 MG tablet Take 1 tablet (40 mg total) by mouth 2 (two)  times daily. (Patient taking differently: Take 40 mg by mouth every morning.) 60 tablet 0   Probiotic Product (PROBIOTIC PO) Take 1 tablet by mouth every morning.     sodium bicarbonate 650 MG tablet Take 650 mg by mouth 2 (two) times daily.     traMADol (ULTRAM) 50 MG tablet Take 1 tablet (50 mg total) by mouth every 6 (six) hours as needed. (Patient taking differently: Take 50 mg by mouth See admin instructions. Take one tablet (50 mg) by mouth every morning, take one tablet (50 mg) during the day as needed for pain) 15 tablet 0   Home: Home Living Family/patient expects to be discharged to:: Private residence Living Arrangements: Alone Available Help at Discharge: Family, Available 24 hours/day Type of Home: House Home Access: Stairs to enter [employer] of Steps: 3 Entrance Stairs-Rails: Right, Left Home Layout: Two level Alternate Level Stairs-Number of Steps: 20 Alternate Level Stairs-Rails: Right Bathroom Shower/Tub: Chiropodist: Standard Bathroom Accessibility: Yes Home Equipment: Conservation officer, nature (2 wheels), [employer] - single point, Civil engineer, contracting, Grab bars - tub/shower  Lives With: Alone   Functional History: Prior Function Prior Level of Function : Independent/Modified Independent, Driving Mobility Comments: household and limited community ambulation without AD ADLs Comments: independent   Functional Status:  Mobility: Bed Mobility Overal bed mobility: Needs Assistance Bed Mobility: Rolling, Sidelying to Sit, Sit to Supine Rolling: Min guard Sidelying to sit: Min assist Supine to sit: Min assist, HOB elevated Sit to supine: Min guard General bed mobility comments: able to roll r for bed pan removal at beginning of session. cues for hand placement on bed rails and to use ues to pull, assistance required to bring trunk upright, assistance to initiate scooting hips to eob-able to sit with no lob noted Transfers Overall transfer level: Needs  assistance Equipment used: Rolling walker (2 wheels) Transfers: Sit to/from Stand, Bed to chair/wheelchair/BSC Sit to Stand: Min assist, Mod assist Bed to/from chair/wheelchair/BSC transfer type:: Step pivot Stand pivot transfers: Max assist Step pivot transfers: Max assist General transfer comment: pt reporting impaired sensation of feet, often scissoring with mobility and with leftward lean Ambulation/Gait Ambulation/Gait assistance: Max assist Gait Distance (Feet): 6 Feet (6' x 2 trials) Assistive device: Rolling walker (2 wheels) Gait Pattern/deviations: Step-to pattern, Scissoring, Ataxic General Gait Details: pt with left lateral lean, often with LLE crossing midline to right. Pt with some improvement with cues to look downward at feet to provide visual input due to LE sensation deficits. Gait velocity: reduced Gait velocity interpretation: <1.31 ft/sec,  indicative of household ambulator   ADL: ADL Overall ADL's : Needs assistance/impaired Eating/Feeding: Minimal assistance, With adaptive utensils Eating/Feeding Details (indicate cue type and reason): built up handles on spoon to assist with self-feeding - will need red tubing Grooming: Moderate assistance, Sitting, Wash/dry face Grooming Details (indicate cue type and reason): able to bring hand to face with grooming items, initially stating "I can't do it" Upper Body Bathing: Maximal assistance Lower Body Bathing: Maximal assistance Upper Body Dressing : Moderate assistance, Sitting Lower Body Dressing: Total assistance, Sitting/lateral leans Lower Body Dressing Details (indicate cue type and reason): to don socks Toilet Transfer: Moderate assistance, Cueing for safety, Cueing for sequencing, Ambulation, Rolling walker (2 wheels), Maximal assistance Toilet Transfer Details (indicate cue type and reason): simulated with transfer from eob with steps to recliner-supine on bed pan upon arrival-total a for peri care Toileting-  Clothing Manipulation and Hygiene: Maximal assistance, Sit to/from stand Functional mobility during ADLs: Moderate assistance, Maximal assistance, Cueing for safety, Cueing for sequencing, Rolling walker (2 wheels) General ADL Comments: Pt with overall feeling of unwell, "tingling" all over and decreased strength and grasp in BUE-described that her body felt like "sand paper" but also described that she "couldnt feel anything"   Cognition: Cognition Overall Cognitive Status: Impaired/Different from baseline Arousal/Alertness: Awake/alert Orientation Level: Oriented X4 Year: 2023 Month: May Day of Week: Correct Attention: Focused, Sustained Focused Attention: Appears intact Sustained Attention: Impaired Memory: Impaired Memory Impairment: Decreased short term memory, Storage deficit (immediate: 5/5 with repetition x3; delayed: 0/5; with cues: 5/5) Awareness: Impaired Awareness Impairment: Emergent impairment Problem Solving Impairment: Verbal complex (Money: 1/3; time: 0/1) Executive Function: Sequencing, Technical brewer:  (clock drawaing: 4/4) Sequencing Impairment: Verbal complex Organizing: Impaired Organizing Impairment: Verbal complex (backward digit span: 1/2) [employer] Scales of Cognitive Functioning: Purposeful/appropriate Cognition Arousal/Alertness: Awake/alert Behavior During Therapy: WFL for tasks assessed/performed Overall Cognitive Status: Impaired/Different from baseline Area of Impairment: Memory, Problem solving, Safety/judgement Orientation Level: Disoriented to Current Attention Level: Sustained Memory: Decreased recall of precautions, Decreased short-term memory Following Commands: Follows one step commands consistently Safety/Judgement: Decreased awareness of safety, Decreased awareness of deficits Awareness: Emergent Problem Solving: Requires verbal cues, Requires tactile cues General Comments: reports feeling "depressed" described it was  related to not being able to walk and move about the way she was before    Blood pressure 129/65, pulse 77, temperature 98.1 F (36.7 C), temperature source Oral, resp. rate 16, height '5\' 1"'$  (1.549 m), weight 55.7 kg, SpO2 97 %. Physical Exam Gen: no distress, normal appearing HEENT: oral mucosa pink and moist, NCAT Cardio: Reg rate Chest: normal effort, normal rate of breathing Abd: soft, non-distended Ext: no edema Psych: pleasant, normal affect Skin: intact Neuro: Alert and oriented x3. Decreased sensation in bilateral hands. 4/5 strength in upper extremities, 5/5 strength in lower extremities.  Musculoskeletal: Cervical soft collar in place.   No results found for this or any previous visit (from the past 48 hour(s)).  MR CERVICAL SPINE WO CONTRAST  Result Date: 05/16/2022 CLINICAL DATA:  Initial evaluation for spinal stenosis. EXAM: MRI CERVICAL SPINE WITHOUT CONTRAST TECHNIQUE: Multiplanar, multisequence MR imaging of the cervical spine was performed. No intravenous contrast was administered. COMPARISON:  CT from 05/13/2022. FINDINGS: Alignment: Straightening with reversal of the normal cervical lordosis. 2 mm anterolisthesis of C2 on C3, 3 mm retrolisthesis of C3 on C4, with 2 mm retrolisthesis of C4 on C5. Findings likely chronic and degenerative. Vertebrae: Vertebral body height maintained without acute or chronic fracture.  Bone marrow signal intensity within normal limits. No discrete or worrisome osseous lesions. No abnormal marrow edema. Cord: Normal signal and morphology. No convincing cord signal changes on this mildly motion degraded exam. Posterior Fossa, vertebral arteries, paraspinal tissues: Chronic microvascular ischemic disease noted within the pons. Visualized brain otherwise unremarkable. Craniocervical junction normal. Mild edema noted within the posterior paraspinous soft tissues of the neck at the level of C4-5, suspected to reflect mild soft tissue injury related to  recent fall. Paraspinous soft tissues demonstrate no other acute finding. Normal flow voids seen within the vertebral arteries bilaterally. Upon review of prior CT, note is made of a thin hairline fracture involving the left occipital calvarium extending towards the jugular foramen (series 3, image 16 on prior head CT). Disc levels: C2-C3: Anterolisthesis. No significant disc bulge. Left-sided facet arthrosis. No significant spinal stenosis. Foramina remain patent. C3-C4: Retrolisthesis with degenerative intervertebral disc space narrowing. Broad posterior disc osteophyte flattens and effaces the ventral thecal sac. Superimposed facet and ligament flavum hypertrophy. Secondary cord flattening without definite cord signal changes. Severe spinal stenosis, with the thecal sac measuring 6 mm in AP diameter at its most narrow point. Severe bilateral C4 foraminal stenosis. C4-C5: Moderate degenerative intervertebral disc space narrowing with diffuse disc osteophyte complex. Broad posterior component flattens and partially faces the ventral thecal sac. Superimposed facet and ligament flavum hypertrophy. Resultant moderate to severe spinal stenosis. Mild cord flattening without cord signal changes. Moderate to severe left with moderate right C5 foraminal stenosis. C5-C6: Degenerative intervertebral disc space narrowing with diffuse disc osteophyte complex. Broad posterior component flattens and partially effaces the ventral thecal sac, eccentric to the left. Mild cord flattening without cord signal changes. Mild spinal stenosis. Moderate right with mild-to-moderate left C6 foraminal narrowing. C6-C7: Degenerative intervertebral disc space narrowing with diffuse disc bulge. Left greater than right uncovertebral spurring. Bilateral facet hypertrophy. No significant spinal stenosis. Mild left C7 foraminal narrowing. Right neural foramen remains patent. C7-T1: Negative interspace. Right worse than left facet hypertrophy. No  spinal stenosis. Mild right C8 foraminal narrowing. Left neural foramen is patent. Visualized upper thoracic spine demonstrates no significant finding. IMPRESSION: 1. Mild edema within the posterior paraspinous soft tissues of the upper cervical spine, likely mild soft tissue injury given the history of recent trauma. 2. No other acute abnormality within the cervical spine. 3. Degenerative spondylosis at C3-4 and C4-5 with resultant moderate to severe spinal stenosis, most pronounced at C3-4. Secondary cord flattening without cord signal changes. Associated moderate to severe bilateral C4 through C6 foraminal narrowing as above. 4. Upon review of prior CT, note is made of a thin hairline fracture involving the left occipital calvarium extending towards the jugular foramen. Electronically Signed   By: Jeannine Boga M.D.   On: 05/16/2022 03:44   DG CHEST PORT 1 VIEW  Result Date: 05/16/2022 CLINICAL DATA:  Rib fractures. EXAM: PORTABLE CHEST 1 VIEW COMPARISON:  05/13/2022 and older exams. FINDINGS: Cardiac silhouette is normal in size and configuration. No mediastinal or hilar masses. Clear lungs.  No convincing pleural effusion and no pneumothorax. No evidence of rib fracture. Specifically, the suspected fracture of the right lateral second rib on shoulder radiographs from 05/13/2022 is not evident. No bone lesions. Stable changes from right breast surgery. IMPRESSION: 1. No active cardiopulmonary disease. 2. No evidence of a rib fracture. Electronically Signed   By: Lajean Manes M.D.   On: 05/16/2022 10:29      Blood pressure 129/65, pulse 77, temperature 98.1 F (36.7 C),  temperature source Oral, resp. rate 16, height '5\' 1"'$  (1.549 m), weight 55.7 kg, SpO2 97 %.  Medical Problem List and Plan: 1. Functional deficits secondary to Central cord syndrome  -patient may shower  -ELOS/Goals: 25 days  Admit to CIR  Order grounds pass 2.  Antithrombotics: -DVT/anticoagulation:  Pharmaceutical:  Lovenox started pm 06/02   -antiplatelet therapy: N/a due to bleed.  3. Pain Management:  Tylenol QID,  4. Mood: LCSW to follow for evaluation and support.    -antipsychotic agents: N/A 5. Neuropsych: This patient is capable of making decisions on her own behalf. 6. Skin/Wound Care: Routine pressure relief measures.  7. Fluids/Electrolytes/Nutrition: Monitor I/O. Check CMET on Monday 8.  TBI w/SAH/SDH: On Keppra bid X 7 days per NS 9. L occipital calvarium Fx: Soft collar at all times with outpatient follow up w/Dr. Annette Stable 10. R- 2nd rib Fx: Encourage IS 11. Severe cervical stenosis: Neuropathy/myelopathy due to fall-->gabapentin added 06/02 12. CKD stage IV: Followed by Dr. Theador Hawthorne Baseline SCr- --monitor with serial checks. Continue sodium bicarb 13. H/o UGIB/Iron deficiency anemia/HH: Add iron supplement. B12 reviewed and is stable.  14. TURBT 04/16/2022 for invasive high grade bladder CA: h/o dysuria/frequency --was referred for palliative XRT by Dr. Tammi Klippel.  She has an appointment at [facility] this Friday at 10:30am for evaluation for radiation and it is very important to her and her niece that she be able to attend this. Would be great if SW can schedule transport to and from this appointment.  15. Anxiety d/o: Stable on Cymbalta and Xanax PTA.  16. 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  30. Screening for vitamin D deficiency: check vitamin D level tomorrow.   I have personally performed a face to face diagnostic evaluation, including, but not limited to relevant history and physical exam findings, of this patient and developed relevant assessment and plan.  Additionally, I have reviewed and concur with the physician assistant's documentation above.  Leeroy Cha, MD, ABPMR   Bary Leriche, PA-C

## 2022-05-18 DIAGNOSIS — H8110 Benign paroxysmal vertigo, unspecified ear: Secondary | ICD-10-CM | POA: Diagnosis not present

## 2022-05-18 DIAGNOSIS — E559 Vitamin D deficiency, unspecified: Secondary | ICD-10-CM

## 2022-05-18 DIAGNOSIS — Z7409 Other reduced mobility: Secondary | ICD-10-CM | POA: Diagnosis not present

## 2022-05-18 DIAGNOSIS — S069XAA Unspecified intracranial injury with loss of consciousness status unknown, initial encounter: Secondary | ICD-10-CM

## 2022-05-18 LAB — VITAMIN D 25 HYDROXY (VIT D DEFICIENCY, FRACTURES): Vit D, 25-Hydroxy: 24.61 ng/mL — ABNORMAL LOW (ref 30–100)

## 2022-05-18 MED ORDER — VITAMIN D (ERGOCALCIFEROL) 1.25 MG (50000 UNIT) PO CAPS
50000.0000 [IU] | ORAL_CAPSULE | ORAL | Status: DC
  Administered 2022-05-18 – 2022-06-01 (×3): 50000 [IU] via ORAL
  Filled 2022-05-18 (×3): qty 1

## 2022-05-18 MED ORDER — MECLIZINE HCL 25 MG PO TABS
12.5000 mg | ORAL_TABLET | Freq: Three times a day (TID) | ORAL | Status: DC
  Administered 2022-05-18 – 2022-05-19 (×4): 12.5 mg via ORAL
  Filled 2022-05-18 (×4): qty 1

## 2022-05-18 NOTE — Evaluation (Addendum)
Physical Therapy Assessment and Plan  Patient Details  Name: Jane Andrews MRN: [id number] Date of Birth: October 08, 1925  PT Diagnosis: Abnormal posture, Abnormality of gait, Ataxia, Ataxic gait, Coordination disorder, Difficulty walking, Dizziness and giddiness, Edema, Impaired cognition, Impaired sensation, Pain in head, intermittent, Quadriplegia, and Vertigo Rehab Potential: Good ELOS: 3 weeks   Today's Date: 05/18/2022 PT Individual Time: 0800-0905 PT Individual Time Calculation (min): 65 min    Hospital Problem: Principal Problem:   TBI (traumatic brain injury) ([place])   Past Medical History:  Past Medical History:  Diagnosis Date   Abdominal adhesions 1994   Allergic rhinitis    Anemia    Anxiety and depression    Aortic stenosis    Arthritis    Atrial fibrillation ([place]) 10/2012   Associated with severe anemia and esophageal pill impaction   Breast carcinoma ([place])    Right mastectomy   Cholelithiasis    Essential hypertension    Gastroesophageal reflux disease    Hiatal hernia   History of blood transfusion    Hypothyroidism    Low back pain    Malabsorption    Short gut syndrome following small bowel resection surgery x2   Nephrolithiasis 2004   Painless hematuria   Short gut syndrome    Bowel resection , 2004   Upper GI bleed 2004   Multiple episodes of melena-? due to gastritis or adverse drug effect (nonsteroidals, small bowel ulceration with Fosamax); caused by Pepto-Bismol during one Emergency Department evaluation   Past Surgical History:  Past Surgical History:  Procedure Laterality Date   ABDOMINAL HYSTERECTOMY     emergency s/p delivery   ABDOMINAL HYSTERECTOMY  1960   massive gynecologic bleeding   BOWEL RESECTION     Resulting short gut syndrome   CHOLECYSTECTOMY N/A 06/25/2018   Procedure: LAPAROSCOPIC CHOLECYSTECTOMY WITH INTRAOPERATIVE CHOLANGIOGRAM;  Surgeon: Armandina Gemma, MD;  Location: [facility];  Service: General;  Laterality: N/A;    COLONOSCOPY W/ POLYPECTOMY  2005   Lipoma; diverticulosis   COLONOSCOPY WITH ESOPHAGOGASTRODUODENOSCOPY (EGD)  11/22/2012   [facility]   CYSTOSCOPY W/ RETROGRADES Bilateral 04/16/2022   Procedure: CYSTOSCOPY;  Surgeon: Primus Bravo., MD;  Location: [facility];  Service: Urology;  Laterality: Bilateral;   ESOPHAGOGASTRODUODENOSCOPY (EGD) WITH PROPOFOL N/A 04/04/2022   Procedure: ESOPHAGOGASTRODUODENOSCOPY (EGD) WITH PROPOFOL;  Surgeon: Rogene Houston, MD;  Location: [facility];  Service: Endoscopy;  Laterality: N/A;  210   HIP ARTHROPLASTY Right 06/15/2020   Procedure: ANTERIOR ARTHROPLASTY BIPOLAR HIP (HEMIARTHROPLASTY);  Surgeon: Mcarthur Rossetti, MD;  Location: [facility];  Service: Orthopedics;  Laterality: Right;   LAPAROSCOPIC LYSIS OF ADHESIONS  1965   s/p adhesions   MASTECTOMY  right breast   MASTECTOMY     Carcinoma of the breast; right   TRANSURETHRAL RESECTION OF BLADDER TUMOR N/A 04/16/2022   Procedure: TRANSURETHRAL RESECTION OF BLADDER TUMOR (TURBT);  Surgeon: Primus Bravo., MD;  Location: [facility];  Service: Urology;  Laterality: N/A;   UPPER GASTROINTESTINAL ENDOSCOPY      Assessment & Plan Clinical Impression: Patient is a 88 y.o. year old female with history of AF, R- MRM for breast cancer, CKD IV, R- staghorn calculus, TURBT (Dr. Felipa Eth) for bladder CA, abdominal adhesions, short gut syndrome who was admitted via [facility] on 05/13/22 after found on the floor by a neighbor with laceration on the back of her head. She was found to have right parieto-occipital scalp hematoma, small acute SAH overlying anterolateral  left frontal lobe and small  SAH along anteroinferior B-fronal lobe with small hemorrhagic contusions right temporal lobe and acute SDH along left tentorium, cervical stenosis C3/4 with advanced DDD C3-C7, suspected fracture right lateral 2nd rib. Patient reported some HA as well as dizziness. Dr. Annette Stable evaluated patient and recommended conservative management as  without neurologic symptoms.    She has had issues with bouts of lethargy reported numbness and tingling in bilateral hands and feet as well as sandpaper feeling in her hands. MRI C- spine ordered for work up on 06/02 to rule out nerve impingement given cervical stenosis. This revealed mild edema in posterior paraspinous soft tissue with thin hairline fracture involving left occipital calvarium extending towards jugular foramen and degenerative spondylosis C3/4 and C4/5 with moderate to severe spinal stenosis with flattening of cord (no signal changes).  C -collar recommended for support.  Palliative care consulted to discuss [place] and patient elected on DNR. Exam by Dr. Annette Stable revealed some mild weakness in grips/intrinsic function bilaterally with patchy sensory loss in BUE/BLE felt to be due to myelopathy secondary to fall in setting of severe cervical spondylosis and monitoring recommended.  She continues to be limited by pain, ataxic gait with left lean, decreased recall with problems sequencing  as well as impaired sensation in bilateral feet. CIR recommended due to functional decline. She states she is very depressed regarding her current limitations.  Patient transferred to CIR on 05/17/2022 .   Patient currently requires mod-max A with mobility secondary to decreased cardiorespiratoy endurance, motor apraxia, ataxia, and decreased coordination, decreased attention, decreased awareness, decreased problem solving, decreased safety awareness, decreased memory, and demonstrates behaviors consistent with [place] Level VIII, vestibular of undetermined origin, and decreased sitting balance, decreased standing balance, decreased postural control, decreased balance strategies, and difficulty maintaining precautions.  Prior to hospitalization, patient was independent  with mobility and lived with Alone in a House home.  Home access is 3Stairs to enter.  Patient will benefit from skilled PT intervention to maximize  safe functional mobility, minimize fall risk, and decrease caregiver burden for planned discharge home with 24 hour assist.  Anticipate patient will benefit from follow up [facility] at discharge.  PT - End of Session Activity Tolerance: Tolerates 30+ min activity with multiple rests Endurance Deficit: Yes PT Assessment Rehab Potential (ACUTE/IP ONLY): Good PT Barriers to Discharge: [place] home environment;Home environment access/layout;Decreased caregiver support;Incontinence;Lack of/limited family support;Behavior PT Patient demonstrates impairments in the following area(s): Balance;Perception;Behavior;Safety;Edema;Sensory;Endurance;Skin Integrity;Motor;Nutrition;Pain PT Transfers Functional Problem(s): Bed Mobility;Bed to Chair;Car;Furniture PT Locomotion Functional Problem(s): Ambulation;Wheelchair Mobility;Stairs PT Plan PT Intensity: Minimum of 1-2 x/day ,45 to 90 minutes PT Frequency: 5 out of 7 days PT Duration Estimated Length of Stay: 3 weeks PT Treatment/Interventions: Ambulation/gait training;Cognitive remediation/compensation;Discharge planning;DME/adaptive equipment instruction;Functional mobility training;Pain management;Psychosocial support;Splinting/orthotics;Therapeutic Activities;UE/LE Strength taining/ROM;Visual/perceptual remediation/compensation;Wheelchair propulsion/positioning;UE/LE Coordination activities;Therapeutic Exercise;Stair training;Skin care/wound management;Patient/family education;Neuromuscular re-education;Functional electrical stimulation;Disease management/prevention;Community reintegration;Balance/vestibular training PT Transfers Anticipated Outcome(s): Supervision using LRAD PT Locomotion Anticipated Outcome(s): CGA using LRAD PT Recommendation Follow Up Recommendations: Home health PT;Outpatient PT (TBD based on patient's progress) Patient destination: Home Equipment Recommended: To be determined Equipment Details: Patient has a RW and SPC from previous  hip fx   PT Evaluation Precautions/Restrictions Precautions Precautions: Fall;Cervical Required Braces or Orthoses: Cervical Brace Cervical Brace: Soft collar Restrictions Weight Bearing Restrictions: No Other Position/Activity Restrictions: 2nd rib fx Pain Interference Pain Interference Pain Effect on Sleep: 0. Does not apply - I have not had any pain or hurting in the past 5 days Pain Interference with Therapy Activities: 0. Does not  apply - I have not received rehabilitationtherapy in the past 5 days Pain Interference with Day-to-Day Activities: 1. Rarely or not at all Home Living/Prior [place] Available Help at Discharge: Family;Available 24 hours/day Type of Home: House Home Access: Stairs to enter [employer] of Steps: 3 Entrance Stairs-Rails: Right;Left Home Layout: Two level Alternate Level Stairs-Number of Steps: 20, bed/bath upstairs Alternate Level Stairs-Rails: Right Bathroom Shower/Tub: Chiropodist: Standard Bathroom Accessibility: Yes  Lives With: Alone Prior Function Level of Independence: Independent with basic ADLs;Independent with transfers;Independent with gait  Able to Take Stairs?: Yes Driving: Yes Vocation: Retired Biomedical scientist: Goes out for breakfast every morning, and lunch at the senior center, keeps up her home Vision/Perception  Vision - History Ability to See in Adequate Light: 0 Adequate Vision - Assessment Additional Comments: slight nystagmus with symptoms going from supine to/from sit on the R Perception Perception: Within Functional Limits Praxis Praxis: Impaired Praxis Impairment Details: Motor planning  Cognition Overall Cognitive Status: Impaired/Different from baseline Arousal/Alertness: Awake/alert Orientation Level: Oriented X4 Attention: Focused;Sustained Focused Attention: Appears intact Sustained Attention: Impaired Sustained Attention Impairment: Functional  basic Memory: Impaired Memory Impairment: Decreased short term memory;Storage deficit Decreased Short Term Memory: Functional basic Awareness: Impaired Awareness Impairment: Anticipatory impairment Executive Function: Sequencing;Organizing Sequencing: Impaired Sequencing Impairment: Functional basic Organizing: Impaired Organizing Impairment: Functional basic Safety/Judgment: Impaired [place] [employer] Scales of Cognitive Functioning: Purposeful/appropriate Sensation Sensation Light Touch: Impaired Detail Central sensation comments: numbness/tingling, described "like sand paper" on distal upper and lower extremities with stocking glove distribution Light Touch Impaired Details: Impaired LLE;Impaired RLE;Impaired LUE;Impaired RUE Proprioception: Impaired Detail Proprioception Impaired Details: Impaired RLE;Impaired LLE Coordination Gross Motor Movements are Fluid and Coordinated: No Fine Motor Movements are Fluid and Coordinated: No Motor  Motor Motor: Tetraplegia;Ataxia;Abnormal postural alignment and control  Trunk/Postural Assessment  Cervical Assessment Cervical Assessment:  (in cervical collar) Thoracic Assessment Thoracic Assessment:  (flexed forward) Lumbar Assessment Lumbar Assessment:  (posterior pelvic tilt) Postural Control Postural Control: Deficits on evaluation Trunk Control: min A Righting Reactions: delayed Protective Responses: delayed  Balance Balance Balance Assessed: Yes Standardized Balance Assessment Standardized Balance Assessment: Berg Balance Test Berg Balance Test Sit to Stand: Needs moderate or maximal assist to stand Standing Unsupported: Unable to stand 30 seconds unassisted Sitting with Back Unsupported but Feet Supported on Floor or Stool: Able to sit 30 seconds Stand to Sit: Needs assistance to sit Transfers: Needs one person to assist Standing Unsupported with Eyes Closed: Needs help to keep from falling Standing Ubsupported with Feet  Together: Needs help to attain position and unable to hold for 15 seconds From Standing, Reach Forward with Outstretched Arm: Loses balance while trying/requires external support From Standing Position, Pick up Object from Floor: Unable to try/needs assist to keep balance From Standing Position, Turn to Look Behind Over each Shoulder: Needs assist to keep from losing balance and falling Turn 360 Degrees: Needs assistance while turning Standing Unsupported, Alternately Place Feet on Step/Stool: Needs assistance to keep from falling or unable to try Standing Unsupported, One Foot in Front: Loses balance while stepping or standing Standing on One Leg: Unable to try or needs assist to prevent fall Total Score: 3 Static Sitting Balance Static Sitting - Balance Support: Feet supported Static Sitting - Level of Assistance: 4: Min assist;5: Stand by assistance Dynamic Sitting Balance Dynamic Sitting - Balance Support: During functional activity Dynamic Sitting - Level of Assistance: 4: Min assist Static Standing Balance Static Standing - Balance Support: During functional activity Static  Standing - Level of Assistance: 3: Mod assist Dynamic Standing Balance Dynamic Standing - Balance Support: During functional activity Dynamic Standing - Level of Assistance: 2: Max assist Extremity Assessment  RUE Assessment RUE Assessment: Exceptions to [facility] Active Range of Motion (AROM) Comments: full range General Strength Comments: 3/5 RUE Body System: Neuro Brunstrum levels for arm and hand: Arm;Hand Brunstrum level for arm: Stage V Relative Independence from Synergy Brunstrum level for hand: Stage V Independence from basic synergies RUE Tone RUE Tone: Within Functional Limits LUE Assessment LUE Assessment: Exceptions to [facility] Active Range of Motion (AROM) Comments: full ROM General Strength Comments: 3/5 LUE Body System: Neuro Brunstrum levels for arm and hand: Arm;Hand Brunstrum level for arm: Stage  V Relative Independence from Synergy Brunstrum level for hand: Stage V Independence from basic synergies LUE Tone LUE Tone: Within Functional Limits RLE Assessment RLE Assessment: Within Functional Limits Active Range of Motion (AROM) Comments: WFL for functional mobility General Strength Comments: Grossly at least 4+/5 throughout in sitting LLE Assessment LLE Assessment: Within Functional Limits Active Range of Motion (AROM) Comments: WFL for functional mobility General Strength Comments: Grossly at least 4+/5 throughout in sitting  [place] left and right activity   Roll left and right assist level: Moderate Assistance - Patient 50 - 74%    Sit to lying activity   Sit to lying assist level: Moderate Assistance - Patient 50 - 74%    Lying to sitting on side of bed activity   Lying to sitting on side of bed assist level: the ability to move from lying on the back to sitting on the side of the bed with no back support.: Moderate Assistance - Patient 50 - 74%     Care Tool Transfers Sit to stand transfer   Sit to stand assist level: Moderate Assistance - Patient 50 - 74%    Chair/bed transfer   Chair/bed transfer assist level: Maximal Assistance - Patient 25 - 49%     Toilet transfer   Assist Level: Maximal Assistance - Patient 24 - 49%    Car transfer Car transfer activity did not occur: Safety/medical concerns        Care Tool Locomotion Ambulation   Assist level: Maximal Assistance - Patient 25 - 49% Assistive device: Hand held assist Max distance: 3 ft  Walk 10 feet activity Walk 10 feet activity did not occur: Safety/medical concerns       Walk 50 feet with 2 turns activity Walk 50 feet with 2 turns activity did not occur: Safety/medical concerns      Walk 150 feet activity Walk 150 feet activity did not occur: Safety/medical concerns      Walk 10 feet on uneven surfaces activity Walk 10 feet on uneven surfaces activity did not  occur: Safety/medical concerns      Stairs Stair activity did not occur: Safety/medical concerns        Walk up/down 1 step activity Walk up/down 1 step or curb (drop down) activity did not occur: Safety/medical concerns      Walk up/down 4 steps activity Walk up/down 4 steps activity did not occur: Safety/medical concerns      Walk up/down 12 steps activity Walk up/down 12 steps activity did not occur: Safety/medical concerns      Pick up small objects from floor Pick up small object from the floor (from standing position) activity did not occur: Safety/medical concerns      Wheelchair Is the patient using  a wheelchair?: Yes Type of Wheelchair: Manual   Wheelchair assist level: Dependent - Patient 0%    Wheel 50 feet with 2 turns activity   Assist Level: Dependent - Patient 0%  Wheel 150 feet activity   Assist Level: Dependent - Patient 0%    Refer to Care Plan for Long Term Goals  SHORT TERM GOAL WEEK 1 PT Short Term Goal 1 (Week 1): Patient will perform basic transfers with equal to or better than mod A consistently. PT Short Term Goal 2 (Week 1): Patient will ambulate >50 fet using LRAD with mod A. PT Short Term Goal 3 (Week 1): Patient will initiate stair training.  Recommendations for other services: None   Skilled Therapeutic Intervention Evaluation completed (see details above and below) with education on PT POC and goals and individual treatment initiated with focus on functional mobility/transfers, LE strength, dynamic standing balance/coordination, and improved endurance with activity. Limited mobility due to frequent BM with urgency and symptoms of vertigo with nausea during evaluation, MD made aware. Discussed use of medication for symptoms of vertigo due to cervical precautions prohibiting a complete vestibular exam.   Patient in bed eating breakfast upon PT arrival. Patient alert and agreeable to PT session. Patient denied pain during session, reports  intermittent headaches and nausea, but none present at start of session, symptoms increased with mobility, RN made aware.   Patient reported bowel urgency before PT could obtain a w/c during the session. Patient was incontinent of bowel x1 and continent of bowel x2 on the [facility] during session. Performed peri-care and lower body clothing management with total A bed level and from [facility]   Therapeutic Activity: Bed Mobility: Patient performed rolling R/L with mod-min A and supine to/from sit with mod A for trunk and lower extremity management due to coordination deficits. Performed in a flat bed without use of bed rails. Patient sat EOB up to 2 min progressing form min A to supervision for sitting balance. Patient initially reporting the room spinning, noted slight nystagmus when holding patient's eye open, as she keeps them closed with symptoms. Symptoms also occurred once in lying from sitting with slightly more defined L beating nystagmus with upward rotation.  Transfers: Patient performed sit to/from stand x3 with mod progressing to min A with use of HHA or R rail. Provided verbal cues for foot placement and forward weight shift. She performed stand pivot bed<>BSC with max A x1 and mod A x1 without an AD. Patient sat on the [facility] for >20 min with supervision for sitting balance with intermittent back support with a pillow placed at back of BSC with x2 BMs. Patient reported that she usually has loose stool 2-3x per morning at home. She wears depends due to urgency, but reports she is usually continent of bowl at home.   Gait Training:  Patient ambulated 3 feet using L HHA with max-mod A. Ambulated with narrow/scissoring gait with decreased step length and hight and lateral trunk sway.  Instructed pt in results of PT evaluation as detailed above, PT POC, rehab potential, rehab goals, and discharge recommendations. Additionally discussed CIR's policies regarding fall safety and use of chair alarm and/or quick  release belt. Pt verbalized understanding and in agreement. Will update pt's family members as they become available.   Patient in bed due to fatigue and feeling "ill," RN made aware, at end of session with breaks locked, bed alarm set, and all needs within reach.    Discharge Criteria: Patient will be discharged  from PT if patient refuses treatment 3 consecutive times without medical reason, if treatment goals not met, if there is a change in medical status, if patient makes no progress towards goals or if patient is discharged from hospital.  The above assessment, treatment plan, treatment alternatives and goals were discussed and mutually agreed upon: by patient  Doreene Burke PT, DPT  05/18/2022, 12:46 PM

## 2022-05-18 NOTE — Progress Notes (Signed)
PROGRESS NOTE   Subjective/Complaints: Sleepy today after working with Cherie this morning Had loose stool Has vertigo concerning for BPPV- meclizine started 12.'5mg'$  TID Vitamin D level low- will start supplement   Objective: ROS: +vertigo   No results found. No results for input(s): WBC, HGB, HCT, PLT in the last 72 hours. No results for input(s): NA, K, CL, CO2, GLUCOSE, BUN, CREATININE, CALCIUM in the last 72 hours.  Intake/Output Summary (Last 24 hours) at 05/18/2022 1454 Last data filed at 05/18/2022 0900 Gross per 24 hour  Intake 240 ml  Output 300 ml  Net -60 ml        Physical Exam: Vital Signs Blood pressure 129/79, pulse 70, temperature 98.2 F (36.8 C), temperature source Oral, resp. rate 18, height '5\' 1"'$  (1.549 m), weight 53.2 kg, SpO2 95 %. Gen: no distress, normal appearing HEENT: oral mucosa pink and moist, NCAT, cervical collar in place Cardio: Reg rate Chest: normal effort, normal rate of breathing Abd: soft, non-distended Ext: no edema Psych: situationaly depressed Skin: intact Neuro: Alert and oriented x3. Decreased sensation in bilateral hands. 4/5 strength in upper extremities, 5/5 strength in lower extremities.  Musculoskeletal: Cervical soft collar in place.   Assessment/Plan: 1. Functional deficits which require 3+ hours per day of interdisciplinary therapy in a comprehensive inpatient rehab setting. Physiatrist is providing close team supervision and 24 hour management of active medical problems listed below. Physiatrist and rehab team continue to assess barriers to discharge/monitor patient progress toward functional and medical goals  Care Tool:  Bathing    Body parts bathed by patient: Right arm, Left arm, Chest, Abdomen, Right upper leg, Left upper leg, Right lower leg, Face   Body parts bathed by helper: Front perineal area, Buttocks, Left lower leg     Bathing assist Assist  Level: Maximal Assistance - Patient 24 - 49%     Upper Body Dressing/Undressing Upper body dressing   What is the patient wearing?: Hospital gown only    Upper body assist Assist Level: Maximal Assistance - Patient 25 - 49%    Lower Body Dressing/Undressing Lower body dressing      What is the patient wearing?: Pants, Incontinence brief     Lower body assist Assist for lower body dressing: Total Assistance - Patient < 25%     Toileting Toileting    Toileting assist Assist for toileting: Dependent - Patient 0%     Transfers Chair/bed transfer  Transfers assist     Chair/bed transfer assist level: Maximal Assistance - Patient 25 - 49%     Locomotion Ambulation   Ambulation assist      Assist level: Maximal Assistance - Patient 25 - 49% Assistive device: Hand held assist Max distance: 3 ft   Walk 10 feet activity   Assist  Walk 10 feet activity did not occur: Safety/medical concerns        Walk 50 feet activity   Assist Walk 50 feet with 2 turns activity did not occur: Safety/medical concerns         Walk 150 feet activity   Assist Walk 150 feet activity did not occur: Safety/medical concerns  Walk 10 feet on uneven surface  activity   Assist Walk 10 feet on uneven surfaces activity did not occur: Safety/medical concerns         Wheelchair     Assist Is the patient using a wheelchair?: Yes Type of Wheelchair: Manual    Wheelchair assist level: Dependent - Patient 0%      Wheelchair 50 feet with 2 turns activity    Assist        Assist Level: Dependent - Patient 0%   Wheelchair 150 feet activity     Assist      Assist Level: Dependent - Patient 0%   Blood pressure 129/79, pulse 70, temperature 98.2 F (36.8 C), temperature source Oral, resp. rate 18, height '5\' 1"'$  (1.549 m), weight 53.2 kg, SpO2 95 %.  . Medical Problem List and Plan: 1. Functional deficits secondary to Central cord  syndrome             -patient may shower, ordered [place] collar for shower             -ELOS/Goals: 25 days             Admit to CIR             Ordered grounds pass 2.  Antithrombotics: -DVT/anticoagulation:  Pharmaceutical: Lovenox started pm 06/02              -antiplatelet therapy: N/a due to bleed.  3. Pain: continue Tylenol QID, Cymbalta '90mg'$  daily, gabapentin, tramadol 4. Mood: LCSW to follow for evaluation and support.               -antipsychotic agents: N/A 5. Neuropsych: This patient is capable of making decisions on her own behalf. 6. Skin/Wound Care: Routine pressure relief measures.  7. Fluids/Electrolytes/Nutrition: Monitor I/O. Check CMET on Monday 8.  TBI w/SAH/SDH: On Keppra bid X 7 days per NS 9. L occipital calvarium Fx: Soft collar at all times with outpatient follow up w/Dr. Annette Stable 10. R- 2nd rib Fx: Encourage IS 11. Severe cervical stenosis: Neuropathy/myelopathy due to fall-->gabapentin added 06/02 12. CKD stage IV: Followed by Dr. Theador Hawthorne Baseline SCr- --monitor with serial checks. Continue sodium bicarb 13. H/o UGIB/Iron deficiency anemia/HH: Add iron supplement. B12 reviewed and is stable.  14. TURBT 04/16/2022 for invasive high grade bladder CA: h/o dysuria/frequency --was referred for palliative XRT by Dr. Tammi Klippel.  She has an appointment at [facility] this Friday at 10:30am for evaluation for radiation and it is very important to her and her niece that she be able to attend this. Would be great if SW can schedule transport to and from this appointment.  15. Anxiety d/o: Continue on Cymbalta and Xanax PTA.  16. 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  17. Vitamin D deficiency: start ergocalciferol 50,000U once per week for 7 weeks 18. BPPV: start meclizine 12.'5mg'$  TID  LOS: 1 days A FACE TO FACE EVALUATION WAS PERFORMED  Martha Clan P Jonatan Wilsey 05/18/2022, 2:54 PM

## 2022-05-18 NOTE — Progress Notes (Signed)
Orthopedic Tech Progress Note Patient Details:  Jane Andrews Jun 01, 1925 [id number]  Ortho Devices Type of Ortho Device: Soft collar Ortho Device/Splint Interventions: Ordered, Application, Adjustment  Delivered new soft collar to replace a soiled one. Post Interventions Patient Tolerated: Well Instructions Provided: Care of device, Adjustment of device  Karolee Stamps 05/18/2022, 6:32 AM

## 2022-05-18 NOTE — Evaluation (Signed)
Occupational Therapy Assessment and Plan  Patient Details  Name: Jane Andrews MRN: [id number] Date of Birth: 1924-12-21  OT Diagnosis: abnormal posture, acute pain, ataxia, cognitive deficits, and muscle weakness (generalized) Rehab Potential: Rehab Potential (ACUTE ONLY): Fair ELOS: ~21 days   Today's Date: 05/18/2022 OT Individual Time: 1100-1220 OT Individual Time Calculation (min): 80 min     Hospital Problem: Principal Problem:   TBI (traumatic brain injury) ([place])   Past Medical History:  Past Medical History:  Diagnosis Date   Abdominal adhesions 1994   Allergic rhinitis    Anemia    Anxiety and depression    Aortic stenosis    Arthritis    Atrial fibrillation ([place]) 10/2012   Associated with severe anemia and esophageal pill impaction   Breast carcinoma ([place])    Right mastectomy   Cholelithiasis    Essential hypertension    Gastroesophageal reflux disease    Hiatal hernia   History of blood transfusion    Hypothyroidism    Low back pain    Malabsorption    Short gut syndrome following small bowel resection surgery x2   Nephrolithiasis 2004   Painless hematuria   Short gut syndrome    Bowel resection , 2004   Upper GI bleed 2004   Multiple episodes of melena-? due to gastritis or adverse drug effect (nonsteroidals, small bowel ulceration with Fosamax); caused by Pepto-Bismol during one Emergency Department evaluation   Past Surgical History:  Past Surgical History:  Procedure Laterality Date   ABDOMINAL HYSTERECTOMY     emergency s/p delivery   ABDOMINAL HYSTERECTOMY  1960   massive gynecologic bleeding   BOWEL RESECTION     Resulting short gut syndrome   CHOLECYSTECTOMY N/A 06/25/2018   Procedure: LAPAROSCOPIC CHOLECYSTECTOMY WITH INTRAOPERATIVE CHOLANGIOGRAM;  Surgeon: Armandina Gemma, MD;  Location: [facility];  Service: General;  Laterality: N/A;   COLONOSCOPY W/ POLYPECTOMY  2005   Lipoma; diverticulosis   COLONOSCOPY WITH ESOPHAGOGASTRODUODENOSCOPY  (EGD)  11/22/2012   [facility]   CYSTOSCOPY W/ RETROGRADES Bilateral 04/16/2022   Procedure: CYSTOSCOPY;  Surgeon: Primus Bravo., MD;  Location: [facility];  Service: Urology;  Laterality: Bilateral;   ESOPHAGOGASTRODUODENOSCOPY (EGD) WITH PROPOFOL N/A 04/04/2022   Procedure: ESOPHAGOGASTRODUODENOSCOPY (EGD) WITH PROPOFOL;  Surgeon: Rogene Houston, MD;  Location: [facility];  Service: Endoscopy;  Laterality: N/A;  210   HIP ARTHROPLASTY Right 06/15/2020   Procedure: ANTERIOR ARTHROPLASTY BIPOLAR HIP (HEMIARTHROPLASTY);  Surgeon: Mcarthur Rossetti, MD;  Location: [facility];  Service: Orthopedics;  Laterality: Right;   LAPAROSCOPIC LYSIS OF ADHESIONS  1965   s/p adhesions   MASTECTOMY  right breast   MASTECTOMY     Carcinoma of the breast; right   TRANSURETHRAL RESECTION OF BLADDER TUMOR N/A 04/16/2022   Procedure: TRANSURETHRAL RESECTION OF BLADDER TUMOR (TURBT);  Surgeon: Primus Bravo., MD;  Location: [facility];  Service: Urology;  Laterality: N/A;   UPPER GASTROINTESTINAL ENDOSCOPY      Assessment & Plan Clinical Impression: Patient is a 88 y.o. year old female female with history of AF, R- MRM for breast cancer, CKD IV, R- staghorn calculus, TURBT (Dr. Felipa Eth) for bladder CA, abdominal adhesions, short gut syndrome who was admitted via [facility] on 05/13/22 after found on the floor by a neighbor with laceration on the back of her head. She was found to have right parieto-occipital scalp hematoma, small acute SAH overlying anterolateral  left frontal lobe and small SAH along anteroinferior B-fronal lobe with small hemorrhagic contusions right  temporal lobe and acute SDH along left tentorium, cervical stenosis C3/4 with advanced DDD C3-C7, suspected fracture right lateral 2nd rib. Patient reported some HA as well as dizziness. Dr. Annette Stable evaluated patient and recommended conservative management as without neurologic symptoms.    She has had issues with bouts of lethargy reported numbness and  tingling in bilateral hands and feet as well as sandpaper feeling in her hands. MRI C- spine ordered for work up on 06/02 to rule out nerve impingement given cervical stenosis. This revealed mild edema in posterior paraspinous soft tissue with thin hairline fracture involving left occipital calvarium extending towards jugular foramen and degenerative spondylosis C3/4 and C4/5 with moderate to severe spinal stenosis with flattening of cord (no signal changes).  C -collar recommended for support.  Palliative care consulted to discuss [place] and patient elected on DNR. Exam by Dr. Annette Stable revealed some mild weakness in grips/intrinsic function bilaterally with patchy sensory loss in BUE/BLE felt to be due to myelopathy secondary to fall in setting of severe cervical spondylosis and monitoring recommended.  She continues to be limited by pain, ataxic gait with left lean, decreased recall with problems sequencing  as well as impaired sensation in bilateral feet. CIR recommended due to functional decline. She states she is very depressed regarding her current limitations. Patient transferred to CIR on 05/17/2022 .    Patient currently requires  mod to max A   with basic self-care skills and basic mobility   secondary to muscle weakness, decreased cardiorespiratoy endurance, impaired timing and sequencing, unbalanced muscle activation, ataxia, and decreased coordination, decreased attention, decreased problem solving, decreased safety awareness, and decreased memory, and decreased sitting balance, decreased standing balance, decreased postural control, decreased balance strategies, and vestibular dysfunction .  Prior to hospitalization, patient could complete ADL and functional ambulation  with independent .  Patient will benefit from skilled intervention to decrease level of assist with basic self-care skills and increase independence with basic self-care skills prior to discharge home with care partner.  Anticipate patient  will require 24 hour supervision and follow up home health.  OT - End of Session Activity Tolerance: Tolerates 10 - 20 min activity with multiple rests Endurance Deficit: Yes OT Assessment Rehab Potential (ACUTE ONLY): Fair OT Patient demonstrates impairments in the following area(s): Balance;Pain;Perception;Cognition;Safety;Sensory;Endurance;Motor;Skin Integrity OT Basic ADL's Functional Problem(s): Eating;Grooming;Bathing;Dressing;Toileting OT Transfers Functional Problem(s): Toilet;Tub/Shower OT Additional Impairment(s): Fuctional Use of Upper Extremity OT Plan OT Intensity: Minimum of 1-2 x/day, 45 to 90 minutes OT Frequency: 5 out of 7 days OT Duration/Estimated Length of Stay: ~21 days OT Treatment/Interventions: Balance/vestibular training;Discharge planning;Functional electrical stimulation;Pain management;Self Care/advanced ADL retraining;Therapeutic Activities;UE/LE Coordination activities;Cognitive remediation/compensation;Disease mangement/prevention;Functional mobility training;Patient/family education;Therapeutic Exercise;Skin care/wound managment;Community reintegration;DME/adaptive equipment instruction;Neuromuscular re-education;Psychosocial support;Splinting/orthotics;UE/LE Strength taining/ROM;Wheelchair propulsion/positioning;Visual/perceptual remediation/compensation OT Self Feeding Anticipated Outcome(s): supervision OT Basic Self-Care Anticipated Outcome(s): supervision OT Toileting Anticipated Outcome(s): supervision OT Bathroom Transfers Anticipated Outcome(s): supervision OT Recommendation Recommendations for Other Services: Neuropsych consult Patient destination: Home Follow Up Recommendations: Outpatient OT Equipment Recommended: None recommended by OT   OT Evaluation Precautions/Restrictions  Precautions Precautions: Fall;Cervical Required Braces or Orthoses: Cervical Brace Cervical Brace: Soft collar Restrictions Weight Bearing Restrictions: No Other  Position/Activity Restrictions: 2nd rib fx General Chart Reviewed: Yes Family/Caregiver Present: Yes Vital Signs   Pain  No pain but sensitivity in Les and nauseous - allowed for rest as needed.  Home Living/Prior Functioning Home Living Family/patient expects to be discharged to:: Private residence Living Arrangements: Alone Available Help at Discharge: Family, Available 24 hours/day Type  of Home: House Home Access: Stairs to enter [employer] of Steps: 3 Entrance Stairs-Rails: Right, Left Home Layout: Two level Alternate Level Stairs-Number of Steps: 20, bed/bath upstairs Alternate Level Stairs-Rails: Right Bathroom Shower/Tub: Chiropodist: Standard Bathroom Accessibility: Yes  Lives With: Alone IADL History Homemaking Responsibilities: Yes Meal Prep Responsibility: Primary Laundry Responsibility: Primary Cleaning Responsibility: Primary Bill Paying/Finance Responsibility: Primary Current License: Yes Prior Function Level of Independence: Independent with basic ADLs, Independent with transfers, Independent with gait  Able to Take Stairs?: Yes Driving: Yes Vocation: Retired Public house manager Requirements: Goes out for breakfast every morning, and [facility], keeps up her home Vision Baseline Vision/History: 1 Wears glasses (diploplia baseline, glasses with prism built in to correct) Ability to See in Adequate Light: 0 Adequate Patient Visual Report: Nausea/blurring vision with head movement Vision Assessment?: Vision impaired- to be further tested in functional context Additional Comments: slight nystagmus with symptoms going from supine to/from sit on the R Perception  Perception: Within Functional Limits Praxis Praxis: Impaired Praxis Impairment Details: Motor planning Cognition Cognition Overall Cognitive Status: Impaired/Different from baseline Arousal/Alertness: Awake/alert Orientation Level:  Person;Place;Situation Person: Oriented Place: Oriented Situation: Oriented Memory: Impaired Memory Impairment: Decreased short term memory;Storage deficit Decreased Short Term Memory: Functional basic Attention: Focused;Sustained Focused Attention: Appears intact Sustained Attention: Impaired Sustained Attention Impairment: Functional basic Awareness: Impaired Awareness Impairment: Anticipatory impairment Executive Function: Sequencing;Organizing Sequencing: Impaired Sequencing Impairment: Manufacturing systems engineer: Impaired Organizing Impairment: Functional basic Safety/Judgment: Impaired [employer] Scales of Cognitive Functioning: Purposeful/appropriate Brief Interview for Mental Status (BIMS) Repetition of Three Words (First Attempt): 3 Temporal Orientation: Year: Correct Temporal Orientation: Month: Accurate within 5 days Temporal Orientation: Day: Incorrect Recall: "Sock": Yes, no cue required Recall: "Blue": Yes, no cue required Recall: "Bed": Yes, no cue required BIMS Summary Score: 14 Sensation Sensation Light Touch: Impaired Detail Central sensation comments: numbness/tingling, described "like sand paper" on distal upper and lower extremities with stocking glove distribution Light Touch Impaired Details: Impaired LLE;Impaired RLE;Impaired LUE;Impaired RUE Proprioception: Impaired Detail Proprioception Impaired Details: Impaired RLE;Impaired LLE Coordination Gross Motor Movements are Fluid and Coordinated: No Fine Motor Movements are Fluid and Coordinated: No Motor  Motor Motor: Tetraplegia;Ataxia;Abnormal postural alignment and control  Trunk/Postural Assessment  Cervical Assessment Cervical Assessment:  (in cervical collar) Thoracic Assessment Thoracic Assessment:  (flexed forward) Lumbar Assessment Lumbar Assessment:  (posterior pelvic tilt) Postural Control Postural Control: Deficits on evaluation Trunk Control: min A Righting Reactions:  delayed Protective Responses: delayed  Balance Balance Balance Assessed: Yes Standardized Balance Assessment Standardized Balance Assessment: Berg Balance Test Berg Balance Test Sit to Stand: Needs moderate or maximal assist to stand Standing Unsupported: Unable to stand 30 seconds unassisted Sitting with Back Unsupported but Feet Supported on Floor or Stool: Able to sit 30 seconds Stand to Sit: Needs assistance to sit Transfers: Needs one person to assist Standing Unsupported with Eyes Closed: Needs help to keep from falling Standing Ubsupported with Feet Together: Needs help to attain position and unable to hold for 15 seconds From Standing, Reach Forward with Outstretched Arm: Loses balance while trying/requires external support From Standing Position, Pick up Object from Floor: Unable to try/needs assist to keep balance From Standing Position, Turn to Look Behind Over each Shoulder: Needs assist to keep from losing balance and falling Turn 360 Degrees: Needs assistance while turning Standing Unsupported, Alternately Place Feet on Step/Stool: Needs assistance to keep from falling or unable to try Standing Unsupported, One Foot in Front: Loses balance while stepping or  standing Standing on One Leg: Unable to try or needs assist to prevent fall Total Score: 3 Static Sitting Balance Static Sitting - Balance Support: Feet supported Static Sitting - Level of Assistance: 4: Min assist;5: Stand by assistance Dynamic Sitting Balance Dynamic Sitting - Balance Support: During functional activity Dynamic Sitting - Level of Assistance: 4: Min assist Static Standing Balance Static Standing - Balance Support: During functional activity Static Standing - Level of Assistance: 3: Mod assist Dynamic Standing Balance Dynamic Standing - Balance Support: During functional activity Dynamic Standing - Level of Assistance: 2: Max assist Extremity/Trunk Assessment RUE Assessment RUE Assessment:  Exceptions to [facility] Active Range of Motion (AROM) Comments: full range General Strength Comments: 3/5 RUE Body System: Neuro Brunstrum levels for arm and hand: Arm;Hand Brunstrum level for arm: Stage V Relative Independence from Synergy Brunstrum level for hand: Stage V Independence from basic synergies RUE Tone RUE Tone: Within Functional Limits LUE Assessment LUE Assessment: Exceptions to [facility] Active Range of Motion (AROM) Comments: full ROM General Strength Comments: 3/5 LUE Body System: Neuro Brunstrum levels for arm and hand: Arm;Hand Brunstrum level for arm: Stage V Relative Independence from Synergy Brunstrum level for hand: Stage V Independence from basic synergies LUE Tone LUE Tone: Within Functional Limits  Care Tool Care Tool Self Care Eating   Eating Assist Level: Minimal Assistance - Patient > 75%    Oral Care    Oral Care Assist Level: Minimal Assistance - Patient > 75%    Bathing   Body parts bathed by patient: Right arm;Left arm;Chest;Abdomen;Right upper leg;Left upper leg;Right lower leg;Face Body parts bathed by helper: Front perineal area;Buttocks;Left lower leg   Assist Level: Maximal Assistance - Patient 24 - 49%    Upper Body Dressing(including orthotics)   What is the patient wearing?: Hospital gown only   Assist Level: Maximal Assistance - Patient 25 - 49%    Lower Body Dressing (excluding footwear)   What is the patient wearing?: Pants;Incontinence brief Assist for lower body dressing: Total Assistance - Patient < 25%    Putting on/Taking off footwear   What is the patient wearing?: Non-skid slipper socks Assist for footwear: Total Assistance - Patient < 25%       Care Tool Toileting Toileting activity   Assist for toileting: Dependent - Patient 0%     Care Tool Bed Mobility Roll left and right activity   Roll left and right assist level: Moderate Assistance - Patient 50 - 74%    Sit to lying activity   Sit to lying assist level:  Moderate Assistance - Patient 50 - 74%    Lying to sitting on side of bed activity   Lying to sitting on side of bed assist level: the ability to move from lying on the back to sitting on the side of the bed with no back support.: Moderate Assistance - Patient 50 - 74%     Care Tool Transfers Sit to stand transfer   Sit to stand assist level: Moderate Assistance - Patient 50 - 74%    Chair/bed transfer   Chair/bed transfer assist level: Maximal Assistance - Patient 25 - 49%     Toilet transfer   Assist Level: Maximal Assistance - Patient 24 - 49%     Care Tool Cognition  Expression of Ideas and Wants Expression of Ideas and Wants: 4. Without difficulty (complex and basic) - expresses complex messages without difficulty and with speech that is clear and easy to understand  Understanding Verbal and  Non-Verbal Content Understanding Verbal and Non-Verbal Content: 4. Understands (complex and basic) - clear comprehension without cues or repetitions   Memory/Recall Ability     Refer to Care Plan for Long Term Goals  SHORT TERM GOAL WEEK 1 OT Short Term Goal 1 (Week 1): Pt will transfer to [facility] with mod A consistently OT Short Term Goal 2 (Week 1): Pt will be able to tolerate sitting EOB to perform UB ADL with close supervision OT Short Term Goal 3 (Week 1): Pt  will thread underwear/ brief and pants with min A witout AE  Recommendations for other services: Neuropsych   Skilled Therapeutic Intervention  Ot eval initiated with OT purpose, role and discussed with pt, pt's brother, sister in law and later in session niece [name]. Pt received in bed and reported not feeling well.  Pt was agreeable to getting up nad trying. Pt came off on the right side of the bed and reported dizziness initially in sitting but resolved quickly. Pt was able to to sit with contact guard at EOB. Initial transfer to the w/c with max A with decr coordination and ataxia present in bilateral LEs with movement. Pt was  abl eto come into standing (sit to stand) with min A with cues for feet placement and to maintain forward weight shift to come onto feet. Pt participated in bathing and dressing at the sink with extra time to attempt. Pt required A to thread brief and pants but was able to assist. Encouraged pt to sit up for lunch and initially agreed but then declined. Assisted back to the bed with max A. Pt able to assist with getting one Le into the bed and A for the other and for trunk control. Pt given red tubing for utensils to help with better grip to be able to self feed. Pt reports decr success with self feeding. Did begin lunch with her and does require setup and food to be cut up and all containers to be open. Pt was then able to self feed with increased success.  Left pt in bed with family present with safety measures in place.  ADL ADL Eating: Minimal assistance Where Assessed-Eating: Bed level Grooming: Minimal assistance Where Assessed-Grooming: Chair Upper Body Bathing: Minimal assistance Where Assessed-Upper Body Bathing: Chair Lower Body Bathing: Minimal assistance Where Assessed-Lower Body Bathing: Chair Upper Body Dressing: Moderate assistance Where Assessed-Upper Body Dressing: Chair Lower Body Dressing: Maximal assistance Where Assessed-Lower Body Dressing: Chair Toileting: Dependent Toilet Transfer: Maximal assistance Mobility  Bed Mobility Bed Mobility: Rolling Right;Rolling Left;Supine to Sit;Sit to Supine Rolling Right: Minimal Assistance - Patient > 75% Rolling Left: Minimal Assistance - Patient > 75% Supine to Sit: Moderate Assistance - Patient 50-74% Sit to Supine: Moderate Assistance - Patient 50-74% Transfers Sit to Stand: Moderate Assistance - Patient 50-74% Stand to Sit: Moderate Assistance - Patient 50-74%   Discharge Criteria: Patient will be discharged from OT if patient refuses treatment 3 consecutive times without medical reason, if treatment goals not met, if  there is a change in medical status, if patient makes no progress towards goals or if patient is discharged from hospital.  The above assessment, treatment plan, treatment alternatives and goals were discussed and mutually agreed upon: by patient  Nicoletta Ba 05/18/2022, 12:47 PM

## 2022-05-18 NOTE — Plan of Care (Signed)
Problem: RH Balance Goal: LTG: Patient will maintain dynamic sitting balance (OT) Description: LTG:  Patient will maintain dynamic sitting balance with assistance during activities of daily living (OT) Flowsheets (Taken 05/18/2022 1221) LTG: Pt will maintain dynamic sitting balance during ADLs with: Independent with assistive device Goal: LTG Patient will maintain dynamic standing with ADLs (OT) Description: LTG:  Patient will maintain dynamic standing balance with assist during activities of daily living (OT)  Flowsheets (Taken 05/18/2022 1221) LTG: Pt will maintain dynamic standing balance during ADLs with: Supervision/Verbal cueing   Problem: Sit to Stand Goal: LTG:  Patient will perform sit to stand in prep for activites of daily living with assistance level (OT) Description: LTG:  Patient will perform sit to stand in prep for activites of daily living with assistance level (OT) Flowsheets (Taken 05/18/2022 1221) LTG: PT will perform sit to stand in prep for activites of daily living with assistance level: Supervision/Verbal cueing   Problem: RH Eating Goal: LTG Patient will perform eating w/assist, cues/equip (OT) Description: LTG: Patient will perform eating with assist, with/without cues using equipment (OT) Flowsheets (Taken 05/18/2022 1221) LTG: Pt will perform eating with assistance level of: Set up assist    Problem: RH Grooming Goal: LTG Patient will perform grooming w/assist,cues/equip (OT) Description: LTG: Patient will perform grooming with assist, with/without cues using equipment (OT) Flowsheets (Taken 05/18/2022 1221) LTG: Pt will perform grooming with assistance level of: Set up assist    Problem: RH Bathing Goal: LTG Patient will bathe all body parts with assist levels (OT) Description: LTG: Patient will bathe all body parts with assist levels (OT) Flowsheets (Taken 05/18/2022 1221) LTG: Pt will perform bathing with assistance level/cueing: Supervision/Verbal cueing    Problem: RH Dressing Goal: LTG Patient will perform upper body dressing (OT) Description: LTG Patient will perform upper body dressing with assist, with/without cues (OT). Flowsheets (Taken 05/18/2022 1221) LTG: Pt will perform upper body dressing with assistance level of: Supervision/Verbal cueing Goal: LTG Patient will perform lower body dressing w/assist (OT) Description: LTG: Patient will perform lower body dressing with assist, with/without cues in positioning using equipment (OT) Flowsheets (Taken 05/18/2022 1221) LTG: Pt will perform lower body dressing with assistance level of: Supervision/Verbal cueing   Problem: RH Toileting Goal: LTG Patient will perform toileting task (3/3 steps) with assistance level (OT) Description: LTG: Patient will perform toileting task (3/3 steps) with assistance level (OT)  Flowsheets (Taken 05/18/2022 1221) LTG: Pt will perform toileting task (3/3 steps) with assistance level: Supervision/Verbal cueing   Problem: RH Toilet Transfers Goal: LTG Patient will perform toilet transfers w/assist (OT) Description: LTG: Patient will perform toilet transfers with assist, with/without cues using equipment (OT) Flowsheets (Taken 05/18/2022 1221) LTG: Pt will perform toilet transfers with assistance level of: Supervision/Verbal cueing   Problem: RH Tub/Shower Transfers Goal: LTG Patient will perform tub/shower transfers w/assist (OT) Description: LTG: Patient will perform tub/shower transfers with assist, with/without cues using equipment (OT) Flowsheets (Taken 05/18/2022 1221) LTG: Pt will perform tub/shower stall transfers with assistance level of: Contact Guard/Touching assist   Problem: RH Memory Goal: LTG Patient will demonstrate ability for day to day recall/carry over during activities of daily living with assistance level (OT) Description: LTG:  Patient will demonstrate ability for day to day recall/carry over during activities of daily living with assistance  level (OT). Flowsheets (Taken 05/18/2022 1221) LTG:  Patient will demonstrate ability for day to day recall/carry over during activities of daily living with assistance level (OT): Supervision   Problem: RH Attention  Goal: LTG Patient will demonstrate this level of attention during functional activites (OT) Description: LTG:  Patient will demonstrate this level of attention during functional activites  (OT) Flowsheets (Taken 05/18/2022 1221) Patient will demonstrate this level of attention during functional activites: Selective Patient will demonstrate above attention level in the following environment: Home LTG: Patient will demonstrate this level of attention during functional activites (OT): Supervision

## 2022-05-18 NOTE — Progress Notes (Signed)
Pt c/o pain in L AC IV. Drainage, mild swelling and redness noted. PIV Dc'ed. New foam ankle and hill dressings applied bilaterally. Heels noted to have some blanchable redness bilaterally. Skin is intact. Pt requesting sleeping meds at 0445. Education provided around rehab and being alert and awake. Explained that the Pt will have evaluations this morning. Pt verbalizes understanding. New soft Collar applied and old collar washed and is at bedside. Old blood and drainage noted on the old collar. PT is asleep currently and wants to eat breakfast later. Tray placed back in the warmer for Pt.

## 2022-05-19 ENCOUNTER — Ambulatory Visit: Payer: Medicare Other | Admitting: Orthopedic Surgery

## 2022-05-19 DIAGNOSIS — S14129A Central cord syndrome at unspecified level of cervical spinal cord, initial encounter: Principal | ICD-10-CM

## 2022-05-19 DIAGNOSIS — N184 Chronic kidney disease, stage 4 (severe): Secondary | ICD-10-CM

## 2022-05-19 DIAGNOSIS — K529 Noninfective gastroenteritis and colitis, unspecified: Secondary | ICD-10-CM | POA: Diagnosis not present

## 2022-05-19 DIAGNOSIS — S069XAS Unspecified intracranial injury with loss of consciousness status unknown, sequela: Secondary | ICD-10-CM | POA: Diagnosis not present

## 2022-05-19 DIAGNOSIS — F329 Major depressive disorder, single episode, unspecified: Secondary | ICD-10-CM

## 2022-05-19 DIAGNOSIS — C679 Malignant neoplasm of bladder, unspecified: Secondary | ICD-10-CM

## 2022-05-19 LAB — CBC WITH DIFFERENTIAL/PLATELET
Abs Immature Granulocytes: 0.1 10*3/uL — ABNORMAL HIGH (ref 0.00–0.07)
Basophils Absolute: 0.1 10*3/uL (ref 0.0–0.1)
Basophils Relative: 1 %
Eosinophils Absolute: 0.6 10*3/uL — ABNORMAL HIGH (ref 0.0–0.5)
Eosinophils Relative: 6 %
HCT: 33.1 % — ABNORMAL LOW (ref 36.0–46.0)
Hemoglobin: 10.8 g/dL — ABNORMAL LOW (ref 12.0–15.0)
Immature Granulocytes: 1 %
Lymphocytes Relative: 20 %
Lymphs Abs: 2 10*3/uL (ref 0.7–4.0)
MCH: 29.2 pg (ref 26.0–34.0)
MCHC: 32.6 g/dL (ref 30.0–36.0)
MCV: 89.5 fL (ref 80.0–100.0)
Monocytes Absolute: 1.3 10*3/uL — ABNORMAL HIGH (ref 0.1–1.0)
Monocytes Relative: 13 %
Neutro Abs: 5.8 10*3/uL (ref 1.7–7.7)
Neutrophils Relative %: 59 %
Platelets: 190 10*3/uL (ref 150–400)
RBC: 3.7 MIL/uL — ABNORMAL LOW (ref 3.87–5.11)
RDW: 13.8 % (ref 11.5–15.5)
WBC: 9.8 10*3/uL (ref 4.0–10.5)
nRBC: 0 % (ref 0.0–0.2)

## 2022-05-19 LAB — COMPREHENSIVE METABOLIC PANEL
ALT: 13 U/L (ref 0–44)
AST: 18 U/L (ref 15–41)
Albumin: 3.3 g/dL — ABNORMAL LOW (ref 3.5–5.0)
Alkaline Phosphatase: 74 U/L (ref 38–126)
Anion gap: 7 (ref 5–15)
BUN: 29 mg/dL — ABNORMAL HIGH (ref 8–23)
CO2: 20 mmol/L — ABNORMAL LOW (ref 22–32)
Calcium: 8.9 mg/dL (ref 8.9–10.3)
Chloride: 111 mmol/L (ref 98–111)
Creatinine, Ser: 1.65 mg/dL — ABNORMAL HIGH (ref 0.44–1.00)
GFR, Estimated: 29 mL/min — ABNORMAL LOW (ref >60)
Glucose, Bld: 98 mg/dL (ref 70–99)
Potassium: 3.6 mmol/L (ref 3.5–5.1)
Sodium: 138 mmol/L (ref 135–145)
Total Bilirubin: 0.7 mg/dL (ref 0.3–1.2)
Total Protein: 6.5 g/dL (ref 6.5–8.1)

## 2022-05-19 MED ORDER — TRAZODONE HCL 50 MG PO TABS
25.0000 mg | ORAL_TABLET | Freq: Every day | ORAL | Status: DC
  Administered 2022-05-19 – 2022-06-06 (×19): 25 mg via ORAL
  Filled 2022-05-19 (×19): qty 1

## 2022-05-19 MED ORDER — MECLIZINE HCL 25 MG PO TABS
12.5000 mg | ORAL_TABLET | Freq: Three times a day (TID) | ORAL | Status: DC | PRN
Start: 2022-05-19 — End: 2022-06-07

## 2022-05-19 NOTE — Progress Notes (Signed)
Occupational Therapy TBI Note  Patient Details  Name: Jane Andrews MRN: [id number] Date of Birth: 06/17/1925  Today's Date: 05/19/2022 OT Individual Time: 7591-6384 OT Individual Time Calculation (min): 65 min    Short Term Goals: Week 1:  OT Short Term Goal 1 (Week 1): Pt will transfer to [facility] with mod A consistently OT Short Term Goal 2 (Week 1): Pt will be able to tolerate sitting EOB to perform UB ADL with close supervision OT Short Term Goal 3 (Week 1): Pt  will thread underwear/ brief and pants with min A witout AE  Skilled Therapeutic Interventions/Progress Updates:    Patient received resting supine in bed.  Patient awake, but flat affect - stating "I do not feel good."  Patient denies pain, dizziness, headache, nausea while supine.  Agreeable to get up and dressed.  Declined putting on street clothing, due to "bathroom issues' but agreeable to put on pajamas.  Patient assisted to seated at edge of bed.  Patient with strong posterior bias in sitting initially.  Once assisted to have feet on floor, able to maintain unsupported static sitting with supervision.  Patient indicated nausea once upright - allowed time to let nausea subside - minimal change.  Patient assisted to stand with moderate assistance - mostly for forward weight shift.  Once standing worked on standing weight shifts before asking her to move feet to stand step toward wheelchair.  Patient with less volitional control of LLE for this transfer - but with slow and controlled weight shifts - able to manage.  Patient able to flex forward at hips to turn on water faucet.  Patient with limited affect this session - at times stating she might feel slightly better - but overall reporting nausea.  Nurse in to pass medication - nausea medication given.   Patient with limited use of hands R>L for grasp, pinch, in-hand manipulation which limits functional tasks such as buttoning.  She does attempt consistently to use hands  functionally, however has limited frustration tolerance while feeling nauseated.   Patient indicated need to void - used Stedy lift to transfer (due to patient's level of fatigue) to bathroom and second helper to manage clothing.   Patient returned to bed at end of session.  Patient indicated room was spinning with transition from sit to supine - would recommend slow transitional movements to allow patient to accommodate.   Bed alarm turned on and call bell / phone/personal items within reach.    Therapy Documentation Precautions:  Precautions Precautions: Fall, Cervical Precaution Booklet Issued: No Precaution Comments: verbal review of neck precautions, will need reinforcement Required Braces or Orthoses: Cervical Brace Cervical Brace: Soft collar Restrictions Weight Bearing Restrictions: No Other Position/Activity Restrictions: 2nd rib fx   Pain: Pain Assessment Pain Scale: 0-10 Pain Score: 0-No pain Agitated Behavior Scale: TBI Observation Details Observation Environment: patient's room Start of observation period - Date: 05/19/22 Start of observation period - Time: 0945 End of observation period - Date: 05/19/22 End of observation period - Time: 1050 Agitated Behavior Scale (DO NOT LEAVE BLANKS) Short attention span, easy distractibility, inability to concentrate: Present to a slight degree Impulsive, impatient, low tolerance for pain or frustration: Absent Uncooperative, resistant to care, demanding: Absent Violent and/or threatening violence toward people or property: Absent Explosive and/or unpredictable anger: Absent Rocking, rubbing, moaning, or other self-stimulating behavior: Absent Pulling at tubes, restraints, etc.: Absent Wandering from treatment areas: Absent Restlessness, pacing, excessive movement: Absent Repetitive behaviors, motor, and/or verbal: Absent Rapid, loud, or  excessive talking: Absent Sudden changes of mood: Absent Easily initiated or excessive  crying and/or laughter: Absent Self-abusiveness, physical and/or verbal: Absent Agitated behavior scale total score: 15   Therapy/Group: Individual Therapy  Mariah Milling 05/19/2022, 12:28 PM

## 2022-05-19 NOTE — Plan of Care (Signed)
  Problem: RH Swallowing Goal: LTG Patient will consume least restrictive diet using compensatory strategies with assistance (SLP) Description: LTG:  Patient will consume least restrictive diet using compensatory strategies with assistance (SLP) Flowsheets (Taken 05/19/2022 1308) LTG: Pt Patient will consume least restrictive diet using compensatory strategies with assistance of (SLP): Modified Independent   Problem: RH Expression Communication Goal: LTG Patient will increase word finding of common (SLP) Description: LTG:  Patient will increase word finding of common objects/daily info/abstract thoughts with cues using compensatory strategies (SLP). Flowsheets (Taken 05/19/2022 1308) LTG: Patient will increase word finding of common (SLP): Supervision Patient will use compensatory strategies to increase word finding of:  Common objects  Daily info   Problem: RH Problem Solving Goal: LTG Patient will demonstrate problem solving for (SLP) Description: LTG:  Patient will demonstrate problem solving for basic/complex daily situations with cues  (SLP) Flowsheets (Taken 05/19/2022 1308) LTG: Patient will demonstrate problem solving for (SLP): Basic daily situations LTG Patient will demonstrate problem solving for: Supervision   Problem: RH Memory Goal: LTG Patient will demonstrate ability for day to day (SLP) Description: LTG:   Patient will demonstrate ability for day to day recall/carryover during cognitive/linguistic activities with assist  (SLP) Flowsheets (Taken 05/19/2022 1308) LTG: Patient will demonstrate ability for day to day recall: New information LTG: Patient will demonstrate ability for day to day recall/carryover during cognitive/linguistic activities with assist (SLP): Supervision Goal: LTG Patient will use memory compensatory aids to (SLP) Description: LTG:  Patient will use memory compensatory aids to recall biographical/new, daily complex information with cues (SLP) Flowsheets  (Taken 05/19/2022 1308) LTG: Patient will use memory compensatory aids to (SLP): Modified Independent   Problem: RH Attention Goal: LTG Patient will demonstrate this level of attention during functional activites (SLP) Description: LTG:  Patient will will demonstrate this level of attention during functional activites (SLP) Flowsheets (Taken 05/19/2022 1308) Patient will demonstrate during cognitive/linguistic activities the attention type of: Sustained Patient will demonstrate this level of attention during cognitive/linguistic activities in: Controlled LTG: Patient will demonstrate this level of attention during cognitive/linguistic activities with assistance of (SLP): Modified Independent Number of minutes patient will demonstrate attention during cognitive/linguistic activities: 30   Problem: RH Awareness Goal: LTG: Patient will demonstrate awareness during functional activites type of (SLP) Description: LTG: Patient will demonstrate awareness during functional activites type of (SLP) Flowsheets (Taken 05/19/2022 1308) Patient will demonstrate during cognitive/linguistic activities awareness type of: Emergent LTG: Patient will demonstrate awareness during cognitive/linguistic activities with assistance of (SLP): Supervision

## 2022-05-19 NOTE — Progress Notes (Signed)
Inpatient Rehabilitation Care Coordinator Assessment and Plan Patient Details  Name: Jane Andrews MRN: [id number] Date of Birth: Dec 08, 1925  Today's Date: 05/19/2022  Hospital Problems: Principal Problem:   Central cord syndrome of cervical spinal cord ([place]) Active Problems:   TBI (traumatic brain injury) [facility])  Past Medical History:  Past Medical History:  Diagnosis Date   Abdominal adhesions 1994   Allergic rhinitis    Anemia    Anxiety and depression    Aortic stenosis    Arthritis    Atrial fibrillation ([place]) 10/2012   Associated with severe anemia and esophageal pill impaction   Breast carcinoma ([place])    Right mastectomy   Cholelithiasis    Essential hypertension    Gastroesophageal reflux disease    Hiatal hernia   History of blood transfusion    Hypothyroidism    Low back pain    Malabsorption    Short gut syndrome following small bowel resection surgery x2   Nephrolithiasis 2004   Painless hematuria   Short gut syndrome    Bowel resection , 2004   Upper GI bleed 2004   Multiple episodes of melena-? due to gastritis or adverse drug effect (nonsteroidals, small bowel ulceration with Fosamax); caused by Pepto-Bismol during one Emergency Department evaluation   Past Surgical History:  Past Surgical History:  Procedure Laterality Date   ABDOMINAL HYSTERECTOMY     emergency s/p delivery   ABDOMINAL HYSTERECTOMY  1960   massive gynecologic bleeding   BOWEL RESECTION     Resulting short gut syndrome   CHOLECYSTECTOMY N/A 06/25/2018   Procedure: LAPAROSCOPIC CHOLECYSTECTOMY WITH INTRAOPERATIVE CHOLANGIOGRAM;  Surgeon: Armandina Gemma, MD;  Location: [facility] ORS;  Service: General;  Laterality: N/A;   COLONOSCOPY W/ POLYPECTOMY  2005   Lipoma; diverticulosis   COLONOSCOPY WITH ESOPHAGOGASTRODUODENOSCOPY (EGD)  11/22/2012   Rehman   CYSTOSCOPY W/ RETROGRADES Bilateral 04/16/2022   Procedure: CYSTOSCOPY;  Surgeon: Primus Bravo., MD;  Location: [facility] ORS;  Service:  Urology;  Laterality: Bilateral;   ESOPHAGOGASTRODUODENOSCOPY (EGD) WITH PROPOFOL N/A 04/04/2022   Procedure: ESOPHAGOGASTRODUODENOSCOPY (EGD) WITH PROPOFOL;  Surgeon: Rogene Houston, MD;  Location: [facility] ENDO SUITE;  Service: Endoscopy;  Laterality: N/A;  210   HIP ARTHROPLASTY Right 06/15/2020   Procedure: ANTERIOR ARTHROPLASTY BIPOLAR HIP (HEMIARTHROPLASTY);  Surgeon: Mcarthur Rossetti, MD;  Location: [facility] OR[facility];  Service: Orthopedics;  Laterality: Right;   LAPAROSCOPIC LYSIS OF ADHESIONS  1965   s/p adhesions   MASTECTOMY  right breast   MASTECTOMY     Carcinoma of the breast; right   TRANSURETHRAL RESECTION OF BLADDER TUMOR N/A 04/16/2022   Procedure: TRANSURETHRAL RESECTION OF BLADDER TUMOR (TURBT);  Surgeon: Primus Bravo., MD;  Location: [facility] ORS;  Service: Urology;  Laterality: N/A;   UPPER GASTROINTESTINAL ENDOSCOPY     Social History:  reports that she has quit smoking. Her smoking use included cigarettes. She has a 30.00 pack-year smoking history. She has never used smokeless tobacco. She reports that she does not drink alcohol and does not use drugs.  Family / Support Systems Marital Status: Widow/Widower How Long?: 2013 Patient Roles:  (N/A) Spouse/Significant Other: Widowed Children: 1 adult son that has since deceased Other Supports: None report Anticipated Caregiver: pt neice [name] [phone]) Ability/Limitations of Caregiver: Pt niece reports that she is retiring at the end of the month so she can be avaialble to help her aunt. Also reports that she has hired care at night, and workign on an additional support to help her during  the day. Caregiver Availability: 24/7 Family Dynamics: Pt lives alone and manages all care needs.  Social History Preferred language: English Religion: Presbyterian Cultural Background: Pt worked in [employer] order for 23 yrs until retirement. Education: high school grad Health Literacy - How often do you need to have someone help you  when you read instructions, pamphlets, or other written material from your doctor or pharmacy?: Never Employment Status: Retired Age Retired: 79 Public relations account executive Issues: Denies Guardian/Conservator: N/A   Abuse/Neglect Abuse/Neglect Assessment Can Be Completed: Yes Physical Abuse: Denies Verbal Abuse: Denies Sexual Abuse: Denies Exploitation of patient/patient's resources: Denies Self-Neglect: Denies  Patient response to: Social Isolation - How often do you feel lonely or isolated from those around you?: Never  Emotional Status Pt's affect, behavior and adjustment status: Pt admits that she is not feeling well at time of visit. Admits to she has been depressed since "this" happened, and has numbness and tingling in her feet. Agreeable to meet with neuropsych. Recent Psychosocial Issues: Seea bove Psychiatric History: Denies any hx Substance Abuse History: Denies  Patient / Family Perceptions, Expectations & Goals Pt/Family understanding of illness & functional limitations: Pt and pt niece [name] have a general understanding of care needs Premorbid pt/family roles/activities: Independent; and assistance with transportation to some appointments. Anticipated changes in roles/activities/participation: Assistance with ADLs/IADLs Pt/family expectations/goals: Pt goal " just want to walk."  [employer]: None Premorbid Home Care/DME Agencies: None Transportation available at discharge: Niece [name] Is the patient able to respond to transportation needs?: Yes In the past 12 months, has lack of transportation kept you from medical appointments or from getting medications?: No In the past 12 months, has lack of transportation kept you from meetings, work, or from getting things needed for daily living?: No Resource referrals recommended: Neuropsychology  Discharge Planning Living Arrangements: Alone Support Systems: Other relatives, Home care  staff Type of Residence: Private residence Insurance Resources: [employer], Multimedia programmer (specify) Nurse, mental health) Financial Resources: [place] Referred: No Living Expenses: Own Money Management: Patient Does the patient have any problems obtaining your medications?: No Home Management: Pt managed all homecare needs Patient/Family Preliminary Plans: TBD Care Coordinator Barriers to Discharge: Decreased caregiver support, Lack of/limited family support Care Coordinator Anticipated Follow Up Needs: HH/OP  Clinical Impression SW met with pt in room to introduce self, explain role, and discuss discharge process. Pt is not a English as a second language teacher. HCPOA-Donna (niece). DME: crutches, cane, and RW (does not use). Pt is depressed since admission and not being able to care for herself, and  having to rely on others. Appreciates the help she has from her niece who has bene very helpful to her.   1321-SW spoke with pt niece [name] to introduce self, explain role, discuss discharge process, and ELOS. She is aware SW will f/u after team conference with more details.   Elchanan Bob A Mickel Schreur 05/19/2022, 1:42 PM

## 2022-05-19 NOTE — Progress Notes (Signed)
Inpatient Rehabilitation  Patient information reviewed and entered into eRehab system by Shalla Bulluck Rilynn Habel, OTR/L.   Information including medical coding, functional ability and quality indicators will be reviewed and updated through discharge.    

## 2022-05-19 NOTE — Care Management (Signed)
[facility] Individual Statement of Services  Patient Name:  Jane Andrews  Date:  05/19/2022  Welcome to the [facility].  Our goal is to provide you with an individualized program based on your diagnosis and situation, designed to meet your specific needs.  With this comprehensive rehabilitation program, you will be expected to participate in at least 3 hours of rehabilitation therapies Monday-Friday, with modified therapy programming on the weekends.  Your rehabilitation program will include the following services:  Physical Therapy (PT), Occupational Therapy (OT), 24 hour per day rehabilitation nursing, Therapeutic Recreaction (TR), Psychology, Neuropsychology, Care Coordinator, Rehabilitation Medicine, [place], and Other  Weekly team conferences will be held on Tuesdays to discuss your progress.  Your Inpatient Rehabilitation Care Coordinator will talk with you frequently to get your input and to update you on team discussions.  Team conferences with you and your family in attendance may also be held.  Expected length of stay: 3 weeks    Overall anticipated outcome: Contact Guard  Depending on your progress and recovery, your program may change. Your Inpatient Rehabilitation Care Coordinator will coordinate services and will keep you informed of any changes. Your Inpatient Rehabilitation Care Coordinator's name and contact numbers are listed  below.  The following services may also be recommended but are not provided by the [facility] will be made to provide these services after discharge if needed.  Arrangements include referral to agencies that provide these services.  Your insurance has been verified to be:  Medicare A/B  Your primary doctor is:  [employer]  Pertinent information will be shared with your doctor and your insurance company.  Inpatient Rehabilitation Care Coordinator:  Cathleen Corti [phone] or ([phone]  Information discussed with and copy given to patient by: Rana Snare, 05/19/2022, 8:47 AM

## 2022-05-19 NOTE — Progress Notes (Signed)
Jane Ribas, MD  Physician CASE MANAGEMENT PMR Pre-admission    Signed Date of Service:  05/16/2022  1:47 PM  Related encounter: ED to Hosp-Admission (Discharged) from 05/13/2022 in [place]                                                                                                                                                                                                                                                                                                                                                                              PMR Admission Coordinator Pre-Admission Assessment   Patient: Jane Andrews is an 88 y.o., female MRN: [id number] DOB: [birth year] Height: _0  (154.9 cm) Weight: 54.4 kg   Insurance Information HMO:     PPO:      PCP:      IPA:      80/20:      OTHER:  PRIMARY: Medicare A and B      Policy#: [id number]      Subscriber: patient CM Name:        Phone#:       Fax#:   Pre-Cert#:        Employer: Retired Benefits:  Phone #:       Name: Checked in passport one source [employer] Date: 09/14/92     Deduct: $1600      Out of Pocket Max: none      Life Max: N/A CIR: 100%      SNF: 100 days Outpatient: 80%     Co-Pay: 20% Home Health: 100%      Co-Pay: none DME: 80%     Co-Pay: 20% Providers: patient's choice  SECONDARY: BCBS federal emp PPO      Policy#: [id number]     Phone#: [phone]   Financial Counselor:        Phone#:     The "Data Collection Information Summary" for patients in Inpatient Rehabilitation Facilities with attached "[place] Records" was provided and verbally reviewed with: Patient   Emergency Contact Information Contact Information       Name Relation Home Work Mobile    Eden Niece [phone]   [phone]    dove,dwight  Brother [phone]               Current Medical History  Patient Admitting Diagnosis: Fall with TBI    History of Present Illness: A 88 yo F lives alone and fell while walking from 1 room to another today.  No loss of consciousness.  She is uncertain why she fell.  She was evaluated at [facility] on 05/13/22 and found to have traumatic brain injury including bilateral frontal subarachnoid hemorrhage, right temporal lobe intracerebral contusion and a left tentorium subdural.  She was also found to have right second rib fracture. She was accepted for transfer fand admission to the trauma service at [facility].  She gets dizzy when she moves her head.  She also complains of tingling in her whole body.  GCS 15.  Neurosurgery saw patient on 05/14/22 with no surgical intervention recommended. Palliative care consult was done and goals of care were discussed.  PT/OT/SLP evaluations were completed with recommendations for acute inpatient rehab admission.   Patient's medical record from [facility] has been reviewed by the rehabilitation admission coordinator and physician.   Past Medical History      Past Medical History:  Diagnosis Date   Abdominal adhesions 1994   Allergic rhinitis     Anemia     Anxiety and depression     Aortic stenosis     Arthritis     Atrial fibrillation ([place]) 10/2012    Associated with severe anemia and esophageal pill impaction   Breast carcinoma (HCC)      Right mastectomy   Cholelithiasis     Essential hypertension     Gastroesophageal reflux disease      Hiatal hernia   History of blood transfusion     Hypothyroidism     Low back pain     Malabsorption      Short gut syndrome following small bowel resection surgery x2   Nephrolithiasis 2004    Painless hematuria   Short gut syndrome      Bowel resection , 2004   Upper GI bleed 2004    Multiple episodes of melena-? due to gastritis or adverse drug effect (nonsteroidals, small bowel ulceration  with Fosamax); caused by Pepto-Bismol during one Emergency Department evaluation      Has the patient had major surgery during 100 days prior to admission? Yes   Family History   family history includes Anuerysm in her father; COPD in her sister; Cancer in an other family member; Healthy in her brother and sister; Rheum arthritis in her sister.   Current Medications   Current Facility-Administered Medications:    acetaminophen (TYLENOL) tablet 1,000 mg, 1,000 mg, Oral, Q6H, Lovick, Ayesha N, MD, 1,000 mg at 05/16/22 1309   ALPRAZolam (XANAX) tablet 0.5 mg, 0.5 mg, Oral, QHS PRN, Georganna Skeans, MD, 0.5 mg at 05/15/22 2320   Chlorhexidine Gluconate Cloth 2 % PADS 6 each, 6 each, Topical, Daily, Lovick, Montel Culver, MD,  6 each at 05/16/22 1019   DULoxetine (CYMBALTA) DR capsule 90 mg, 90 mg, Oral, Daily, Georganna Skeans, MD, 90 mg at 05/16/22 1019   enoxaparin (LOVENOX) injection 30 mg, 30 mg, Subcutaneous, Q24H, Lovick, Montel Culver, MD, 30 mg at 05/16/22 1019   gabapentin (NEURONTIN) capsule 100 mg, 100 mg, Oral, QHS, Simaan, Elizabeth S, PA-C   levETIRAcetam (KEPPRA) tablet 500 mg, 500 mg, Oral, BID, Lovick, Montel Culver, MD, 500 mg at 05/16/22 1019   levothyroxine (SYNTHROID) tablet 75 mcg, 75 mcg, Oral, QAC breakfast, Georganna Skeans, MD, 75 mcg at 05/16/22 9622   methocarbamol (ROBAXIN) tablet 750 mg, 750 mg, Oral, QID, Jesusita Oka, MD, 750 mg at 05/16/22 1309   metoprolol succinate (TOPROL-XL) 24 hr tablet 25 mg, 25 mg, Oral, Daily, Georganna Skeans, MD, 25 mg at 05/16/22 1019   mirabegron ER (MYRBETRIQ) tablet 25 mg, 25 mg, Oral, Daily, Georganna Skeans, MD, 25 mg at 05/16/22 1019   morphine (PF) 2 MG/ML injection 1-2 mg, 1-2 mg, Intravenous, Q4H PRN, Jesusita Oka, MD, 2 mg at 05/15/22 2334   ondansetron (ZOFRAN-ODT) disintegrating tablet 4 mg, 4 mg, Oral, Q6H PRN **OR** ondansetron (ZOFRAN) injection 4 mg, 4 mg, Intravenous, Q6H PRN, Georganna Skeans, MD, 4 mg at 05/15/22 0858   sodium  bicarbonate tablet 650 mg, 650 mg, Oral, BID, Georganna Skeans, MD, 650 mg at 05/16/22 1019   traMADol (ULTRAM) tablet 50 mg, 50 mg, Oral, Q6H PRN, Georganna Skeans, MD, 50 mg at 05/16/22 1019   Patients Current Diet:  Diet Order                  Diet regular Room service appropriate? Yes; Fluid consistency: Thin  Diet effective now                         Precautions / Restrictions Precautions Precautions: Fall Restrictions Weight Bearing Restrictions: (P) No Other Position/Activity Restrictions: 2nd rib fx    Has the patient had 2 or more falls or a fall with injury in the past year? Yes   Prior Activity Level Community (5-7x/wk): Went out daily.  Was driving, was independent.  Went out to eat with friends.   Prior Functional Level Self Care: Did the patient need help bathing, dressing, using the toilet or eating? Independent   Indoor Mobility: Did the patient need assistance with walking from room to room (with or without device)? Independent   Stairs: Did the patient need assistance with internal or external stairs (with or without device)? Independent   Functional Cognition: Did the patient need help planning regular tasks such as shopping or remembering to take medications? Independent   Patient Information Are you of Hispanic, Latino/a,or Spanish origin?: A. No, not of Hispanic, Latino/a, or Spanish origin What is your race?: A. White Do you need or want an interpreter to communicate with a doctor or health care staff?: 0. No   Patient's Response To:  Health Literacy and Transportation Is the patient able to respond to health literacy and transportation needs?: Yes Health Literacy - How often do you need to have someone help you when you read instructions, pamphlets, or other written material from your doctor or pharmacy?: Never In the past 12 months, has lack of transportation kept you from medical appointments or from getting medications?: No In the past 12  months, has lack of transportation kept you from meetings, work, or from getting things needed for daily living?: No   Home  Assistive Devices / Equipment Home Equipment: Conservation officer, nature (2 wheels), [employer] - single point, [employer], Grab bars - tub/shower   Prior Device Use: Indicate devices/aids used by the patient prior to current illness, exacerbation or injury? None of the above   Current Functional Level Cognition   Arousal/Alertness: Awake/alert Overall Cognitive Status: Impaired/Different from baseline Current Attention Level: Sustained Orientation Level: (P) Oriented X4 Following Commands: Follows one step commands with increased time Safety/Judgement: Decreased awareness of deficits General Comments: reports feeling "depressed" described it was related to not being able to walk and move about the way she was before Attention: Focused, Sustained Focused Attention: Appears intact Sustained Attention: Impaired Memory: Impaired Memory Impairment: Decreased short term memory, Storage deficit (immediate: 5/5 with repetition x3; delayed: 0/5; with cues: 5/5) Awareness: Impaired Awareness Impairment: Emergent impairment Problem Solving Impairment: Verbal complex (Money: 1/3; time: 0/1) Executive Function: Sequencing, Technical brewer:  (clock drawaing: 4/4) Sequencing Impairment: Verbal complex Organizing: Impaired Organizing Impairment: Verbal complex (backward digit span: 1/2) [employer] Scales of Cognitive Functioning: Purposeful/appropriate    Extremity Assessment (includes Sensation/Coordination)   Upper Extremity Assessment: Generalized weakness, RUE deficits/detail, LUE deficits/detail RUE Sensation: decreased light touch (tingling) LUE Sensation: decreased light touch (tingling)  Lower Extremity Assessment: Generalized weakness, RLE deficits/detail, LLE deficits/detail RLE Sensation: decreased light touch (tingling) LLE Sensation: decreased light touch  (tingling)     ADLs   Overall ADL's : Needs assistance/impaired Eating/Feeding: Minimal assistance, With adaptive utensils Eating/Feeding Details (indicate cue type and reason): built up handles on spoon to assist with self-feeding - will need red tubing Grooming: Moderate assistance, Sitting, Wash/dry face Grooming Details (indicate cue type and reason): able to bring hand to face with grooming items, initially stating "I can't do it" Upper Body Bathing: Maximal assistance Lower Body Bathing: Maximal assistance Upper Body Dressing : Moderate assistance, Sitting Lower Body Dressing: Total assistance, Sitting/lateral leans Lower Body Dressing Details (indicate cue type and reason): to don socks Toilet Transfer: Moderate assistance, Cueing for safety, Cueing for sequencing, Ambulation, Rolling walker (2 wheels), Maximal assistance Toilet Transfer Details (indicate cue type and reason): simulated with transfer from eob with steps to recliner-supine on bed pan upon arrival-total a for peri care Toileting- Clothing Manipulation and Hygiene: Maximal assistance, Sit to/from stand Functional mobility during ADLs: Moderate assistance, Maximal assistance, Cueing for safety, Cueing for sequencing, Rolling walker (2 wheels) General ADL Comments: Pt with overall feeling of unwell, "tingling" all over and decreased strength and grasp in BUE-described that her body felt like "sand paper" but also described that she "couldnt feel anything"     Mobility   Overal bed mobility: Needs Assistance Bed Mobility: Sidelying to Sit, Rolling Rolling: Mod assist Sidelying to sit: Min assist Supine to sit: Min assist, HOB elevated Sit to supine: Mod assist, +2 for physical assistance General bed mobility comments: able to roll r for bed pan removal at beginning of session. cues for hand placement on bed rails and to use ues to pull, assistance required to bring trunk upright, assistance to initiate scooting hips to  eob-able to sit with no lob noted     Transfers   Overall transfer level: Needs assistance Equipment used: Rolling walker (2 wheels) Transfers: Sit to/from Stand, Bed to chair/wheelchair/BSC Sit to Stand: Mod assist Bed to/from chair/wheelchair/BSC transfer type:: Step pivot Stand pivot transfers: Max assist Step pivot transfers: Mod assist, Max assist General transfer comment: pt with ataxic movements of BLE, moreso with LLE. LLE tending to slide to midline with  left lateral lean     Ambulation / Gait / Stairs / Wheelchair Mobility   Ambulation/Gait Ambulation/Gait assistance: Max Web designer (Feet): 4 Feet Assistive device: Rolling walker (2 wheels) Gait Pattern/deviations: Step-to pattern, Ataxic General Gait Details: pt with left lateral lean, ataxic movement with BLE Gait velocity: reduced Gait velocity interpretation: <1.31 ft/sec, indicative of household ambulator     Posture / Balance Dynamic Sitting Balance Sitting balance - Comments: minA, left lateral and posterior lean Balance Overall balance assessment: Needs assistance Sitting-balance support: Single extremity supported, Bilateral upper extremity supported, Feet supported Sitting balance-Leahy Scale: Poor Sitting balance - Comments: minA, left lateral and posterior lean Postural control: Left lateral lean Standing balance support: Bilateral upper extremity supported, Reliant on assistive device for balance Standing balance-Leahy Scale: Poor Standing balance comment: min-modA, left lateral lean     Special needs/care consideration Skin Bruising on facial/head area and Behavioral consideration is depressed with current situation.    Previous Home Environment (from acute therapy documentation) Living Arrangements: Alone  Lives With: Alone Available Help at Discharge: Family, Available 24 hours/day Type of Home: House Home Layout: Two level Alternate Level Stairs-Rails: Right Alternate Level Stairs-Number  of Steps: 20 Home Access: Stairs to enter Entrance Stairs-Rails: Right, Left Entrance Stairs-Number of Steps: 3 Bathroom Shower/Tub: Chiropodist: Standard Bathroom Accessibility: Yes How Accessible: Accessible via walker   Discharge Living Setting Plans for Discharge Living Setting: Patient's home, House, Alone (Lives alone, but niece can assist/hire caregiver) Type of Home at Discharge: House Discharge Home Layout: Two level, Bed/bath upstairs, 1/2 bath on main level, Other (Comment) (Has a bed/cot on the first floor that she can use.) Alternate Level Stairs-Rails: Right Alternate Level Stairs-Number of Steps: 20 steps to second level Discharge Home Access: Stairs to enter Entrance Stairs-Rails: Right, Left Entrance Stairs-Number of Steps: 3 steps Discharge Bathroom Shower/Tub: Tub/shower unit Discharge Bathroom Toilet: Standard Discharge Bathroom Accessibility: Yes How Accessible: Accessible via walker   Social/Family/Support Systems Patient Roles: Other (Comment) (Has a niece and a brother/sister-in-law) Contact Information: Vanessa Barbara - niece - [phone] Anticipated Caregiver: Niece and hired caregiver Ability/Limitations of Caregiver: Niece can provide supervision intermittently but will hire caregiver as needed. Caregiver Availability: 24/7 Discharge Plan Discussed with Primary Caregiver: Yes Is Caregiver In Agreement with Plan?: Yes Does Caregiver/Family have Issues with Lodging/Transportation while Pt is in Rehab?: No   Goals Patient/Family Goal for Rehab: PT/OT/SLP supervision to min assist goals Expected length of stay: 12-14 days Pt/Family Agrees to Admission and willing to participate: Yes Program Orientation Provided & Reviewed with Pt/Caregiver Including Roles  & Responsibilities: Yes   Decrease burden of Care through IP rehab admission: N/A   Possible need for SNF placement upon discharge: Not planned   Patient Condition: I have  reviewed medical records from [facility], spoken with CM, and patient and family member. I met with patient at the bedside and discussed via phone for inpatient rehabilitation assessment.  Patient will benefit from ongoing PT, OT, and SLP, can actively participate in 3 hours of therapy a day 5 days of the week, and can make measurable gains during the admission.  Patient will also benefit from the coordinated team approach during an Inpatient Acute Rehabilitation admission.  The patient will receive intensive therapy as well as Rehabilitation physician, nursing, social worker, and care management interventions.  Due to bladder management, bowel management, safety, skin/wound care, disease management, medication administration, pain management, and patient education the patient requires 24 hour a day rehabilitation  nursing.  The patient is currently from min assist to max assist with mobility and basic ADLs.  Discharge setting and therapy post discharge at home with home health is anticipated.  Patient has agreed to participate in the Acute Inpatient Rehabilitation Program and will admit tomorrow, 05/17/22.   Preadmission Screen Completed By:  Retta Diones, 05/16/2022 1:47 PM ______________________________________________________________________   Discussed status with Dr. Ranell Patrick on 05/16/22 at 1:30 pm and received approval for admission tomorrow, 05/17/22.   Admission Coordinator:  Retta Diones, RN, time 1:53 pm/Date 05/16/22    Assessment/Plan: Diagnosis: SAH Does the need for close, 24 hr/day Medical supervision in concert with the patient's rehab needs make it unreasonable for this patient to be served in a less intensive setting? Yes Co-Morbidities requiring supervision/potential complications: TBI, chronic diarrhea, HTN, Anemia, CKD stage 4 Due to bladder management, bowel management, safety, skin/wound care, disease management, medication administration, pain management, and patient education, does  the patient require 24 hr/day rehab nursing? Yes Does the patient require coordinated care of a physician, rehab nurse, PT, OT, and SLP to address physical and functional deficits in the context of the above medical diagnosis(es)? Yes Addressing deficits in the following areas: balance, endurance, locomotion, strength, transferring, bowel/bladder control, bathing, dressing, feeding, grooming, toileting, cognition, and psychosocial support Can the patient actively participate in an intensive therapy program of at least 3 hrs of therapy 5 days a week? Yes The potential for patient to make measurable gains while on inpatient rehab is excellent Anticipated functional outcomes upon discharge from inpatient rehab: supervision PT, supervision OT, supervision SLP Estimated rehab length of stay to reach the above functional goals is: 10-14 days Anticipated discharge destination: Home 10. Overall Rehab/Functional Prognosis: excellent     MD Signature: Leeroy Cha, MD         Revision History                                         Note Details  Author Jane Ribas, MD File Time 05/17/2022  1:56 PM  Author Type Physician Status Signed  Last Editor Jane Andrews, [place] # [phone] [place] Date 05/17/2022

## 2022-05-19 NOTE — Progress Notes (Signed)
Physical Therapy TBI Note  Patient Details  Name: Jane Andrews MRN: [id number] Date of Birth: 30-Jun-1925  Today's Date: 05/19/2022 PT Individual Time: 1415-1530 PT Individual Time Calculation (min): 75 min   Short Term Goals: Week 1:  PT Short Term Goal 1 (Week 1): Patient will perform basic transfers with equal to or better than mod A consistently. PT Short Term Goal 2 (Week 1): Patient will ambulate >50 fet using LRAD with mod A. PT Short Term Goal 3 (Week 1): Patient will initiate stair training.  Skilled Therapeutic Interventions/Progress Updates:   Pt initially supine, agreeable to treatment. Dons pants w/max assist and cues for sequencing of LE movment/bridgning Supine to side to sit w/max assist.   Static sit w/min to cga. stand pivot transfer w/max assist due to ataxic LE movment w/limited awareness/sensory deficits w/progresssion/placement of feet. Pt transpoted to gym  Sit to stand in parallel bars w/assist for foot placement/stabilization and mod to min assist to power up. In standing tends to demonstrate hips windswept to L posture.  Worked on wt shifting to midline and maintaining midline w/static stand. Seated break Advanced to single step forward/reverse LLE using cone as target to promote abduction w/stepping due to scissoring L>R.  Cues to avoid scissoring w/reverse step as scissoring again occurs, therapist foot placed to block scissoring.  Seated rest then repeated x 8-10 reps w/R. Seated rest  Gait in parallel bars: 31f w/cues as above to promote wider base/avoid scissoring, wc closely following.  Seated rest   Gait w/RW x 292fw/min assist of 2, mod at times when LOB to L due to windswept lean L at hips/L lean/L lob.  Ataxic advancement and scissoring L>R, instability at pelvis, pt states she is unable to feel feet or placement.  stand pivot transfer using RW wc to bed w/heavy cueing for footplacement, sequencing, overall mod assist.  Sit to supine w/min  assist and cues.  Pt left supine w/rails up x 3, alarm set, bed in lowest position, and needs in reach.  Pt requires frequent seated rest breaks.  States "do you know how old I am?"  Pt encouraged by therapist and educated that staff will allow for rest as needed and be considerate of age as challenge.   Therapy Documentation Precautions:  Precautions Precautions: Fall, Cervical Precaution Booklet Issued: No Precaution Comments: verbal review of neck precautions, will need reinforcement Required Braces or Orthoses: Cervical Brace Cervical Brace: Soft collar Restrictions Weight Bearing Restrictions: No Other Position/Activity Restrictions: 2nd rib fx Agitated Behavior Scale: TBI Observation Details Observation Environment: gym Start of observation period - Date: 05/19/22 Start of observation period - Time: 1415 End of observation period - Date: 05/19/22 End of observation period - Time: 1530 Agitated Behavior Scale (DO NOT LEAVE BLANKS) Short attention span, easy distractibility, inability to concentrate: Present to a slight degree Impulsive, impatient, low tolerance for pain or frustration: Absent Uncooperative, resistant to care, demanding: Absent Violent and/or threatening violence toward people or property: Absent Explosive and/or unpredictable anger: Absent Rocking, rubbing, moaning, or other self-stimulating behavior: Absent Pulling at tubes, restraints, etc.: Absent Wandering from treatment areas: Absent Restlessness, pacing, excessive movement: Absent Repetitive behaviors, motor, and/or verbal: Absent Rapid, loud, or excessive talking: Absent Sudden changes of mood: Absent Easily initiated or excessive crying and/or laughter: Absent Self-abusiveness, physical and/or verbal: Absent Agitated behavior scale total score: 15  Therapy/Group: Individual Therapy BaCallie FieldingPTOwl [place]/04/2022, 3:52 PM

## 2022-05-19 NOTE — Progress Notes (Addendum)
PROGRESS NOTE   Subjective/Complaints: Pt says she's not feeling well. I asked her what specifically was bothering her and she just feels depressed. Asked if we could start something for her mood. Frustrated by weakness and numbness in arms among other symptoms she's dealing with  ROS: Patient denies fever, rash, sore throat, blurred vision, dizziness, nausea, vomiting, diarrhea, cough, shortness of breath or chest pain, joint or back/neck pain, headache .    Objective: ROS: +vertigo   No results found. Recent Labs    05/19/22 0612  WBC 9.8  HGB 10.8*  HCT 33.1*  PLT 190   Recent Labs    05/19/22 0612  NA 138  K 3.6  CL 111  CO2 20*  GLUCOSE 98  BUN 29*  CREATININE 1.65*  CALCIUM 8.9    Intake/Output Summary (Last 24 hours) at 05/19/2022 1034 Last data filed at 05/19/2022 0000 Gross per 24 hour  Intake 480 ml  Output --  Net 480 ml        Physical Exam: Vital Signs Blood pressure 122/67, pulse 74, temperature 98 F (36.7 C), temperature source Oral, resp. rate 18, height '5\' 1"'$  (1.549 m), weight 53.2 kg, SpO2 98 %. Constitutional: No distress . Vital signs reviewed. HEENT: NCAT, EOMI, oral membranes moist Neck: c-collar Cardiovascular: RRR without murmur. No JVD    Respiratory/Chest: CTA Bilaterally without wheezes or rales. Normal effort    GI/Abdomen: BS +, non-tender, non-distended Ext: no clubbing, cyanosis, or edema Psych:appears a bit down Skin: occipital wound with scab, clean/dry Neuro: Alert and oriented x3. Decreased sensation in bilateral hands and ulnar side of wrist.  4/5 strength in upper extremities, 5/5 strength in lower extremities.  Musculoskeletal: no pain with limb rom.   Assessment/Plan: 1. Functional deficits which require 3+ hours per day of interdisciplinary therapy in a comprehensive inpatient rehab setting. Physiatrist is providing close team supervision and 24 hour management  of active medical problems listed below. Physiatrist and rehab team continue to assess barriers to discharge/monitor patient progress toward functional and medical goals  Care Tool:  Bathing    Body parts bathed by patient: Right arm, Left arm, Chest, Abdomen, Right upper leg, Left upper leg, Right lower leg, Face   Body parts bathed by helper: Front perineal area, Buttocks, Left lower leg     Bathing assist Assist Level: Maximal Assistance - Patient 24 - 49%     Upper Body Dressing/Undressing Upper body dressing   What is the patient wearing?: Hospital gown only    Upper body assist Assist Level: Maximal Assistance - Patient 25 - 49%    Lower Body Dressing/Undressing Lower body dressing      What is the patient wearing?: Incontinence brief     Lower body assist Assist for lower body dressing: Maximal Assistance - Patient 25 - 49%     Toileting Toileting    Toileting assist Assist for toileting: Moderate Assistance - Patient 50 - 74%     Transfers Chair/bed transfer  Transfers assist     Chair/bed transfer assist level: Maximal Assistance - Patient 25 - 49%     Locomotion Ambulation   Ambulation assist  Assist level: Maximal Assistance - Patient 25 - 49% Assistive device: Hand held assist Max distance: 3 ft   Walk 10 feet activity   Assist  Walk 10 feet activity did not occur: Safety/medical concerns        Walk 50 feet activity   Assist Walk 50 feet with 2 turns activity did not occur: Safety/medical concerns         Walk 150 feet activity   Assist Walk 150 feet activity did not occur: Safety/medical concerns         Walk 10 feet on uneven surface  activity   Assist Walk 10 feet on uneven surfaces activity did not occur: Safety/medical concerns         Wheelchair     Assist Is the patient using a wheelchair?: Yes Type of Wheelchair: Manual    Wheelchair assist level: Dependent - Patient 0%      Wheelchair  50 feet with 2 turns activity    Assist        Assist Level: Dependent - Patient 0%   Wheelchair 150 feet activity     Assist      Assist Level: Dependent - Patient 0%   Blood pressure 122/67, pulse 74, temperature 98 F (36.7 C), temperature source Oral, resp. rate 18, height '5\' 1"'$  (1.549 m), weight 53.2 kg, SpO2 98 %.  . Medical Problem List and Plan: 1. Functional deficits secondary to Central cord syndrome as well as TBI (SAH/SDH)             -patient may shower, ordered [place] collar for shower             -ELOS/Goals: 25 days             -Continue CIR therapies including PT, OT  2.  Antithrombotics: -DVT/anticoagulation:  Pharmaceutical: Lovenox started pm 06/02              -antiplatelet therapy: N/a due to bleed.  3. Pain: continue Tylenol QID, Cymbalta '90mg'$  daily, gabapentin, tramadol 4. Mood: pt to provide ego support -pt is already on cymbalta and xanax as pta for anxiety disorder   -hesitate adjusting either med higher given age  -check free T4 level -have asked neuropsych to see              -antipsychotic agents: N/A 5. Neuropsych: This patient is capable of making decisions on her own behalf. 6. Skin/Wound Care: Routine pressure relief measures.  7. Fluids/Electrolytes/Nutrition: encourage PO  I personally reviewed the patient's labs today.   8.  Seizure proph: On Keppra bid X 7 days per NS 9. L occipital calvarium Fx: Soft collar at all times with outpatient follow up w/Dr. Annette Stable 10. R- 2nd rib Fx: Encourage IS 11. Severe cervical stenosis: Neuropathy/myelopathy due to fall-->gabapentin added 06/02--hands still feel "like sand paper", sx might be a little worse today. Advised her that we are following at her sensory symptoms might wax and wane a bit. 12. CKD stage IV: Followed by Dr. Theador Hawthorne Baseline SCr- --sl bump in BUN today, no changes to regimen - Continue sodium bicarb 13. H/o UGIB/Iron deficiency anemia/HH: Add iron supplement. B12  reviewed and is stable.  14. TURBT 04/16/2022 for invasive high grade bladder CA: h/o dysuria/frequency --was referred for palliative XRT by Dr. Tammi Klippel.  --6/5 'm checking today to see if there are options aside from taking ambulance to the cancer center for this consult   16. 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  17. Vitamin D deficiency: start ergocalciferol 50,000U once per week for 7 weeks 18. BPPV: change meclizine to tid prn  -vestibular evaluation with therapy  LOS: 2 days A FACE TO FACE EVALUATION WAS PERFORMED  Meredith Staggers 05/19/2022, 10:34 AM

## 2022-05-19 NOTE — Evaluation (Addendum)
Speech Language Pathology Assessment and Plan  Patient Details  Name: Jane Andrews MRN: [id number] Date of Birth: 04/05/1925  SLP Diagnosis: Cognitive Impairments  Rehab Potential: Good ELOS: 3 weeks    Today's Date: 05/19/2022 SLP Individual Time: 0825-0920 SLP Individual Time Calculation (min): 55 min   Hospital Problem: Principal Problem:   Central cord syndrome of cervical spinal cord (HCC) Active Problems:   TBI (traumatic brain injury) (HCC)  Past Medical History:  Past Medical History:  Diagnosis Date   Abdominal adhesions 1994   Allergic rhinitis    Anemia    Anxiety and depression    Aortic stenosis    Arthritis    Atrial fibrillation (HCC) 10/2012   Associated with severe anemia and esophageal pill impaction   Breast carcinoma (HCC)    Right mastectomy   Cholelithiasis    Essential hypertension    Gastroesophageal reflux disease    Hiatal hernia   History of blood transfusion    Hypothyroidism    Low back pain    Malabsorption    Short gut syndrome following small bowel resection surgery x2   Nephrolithiasis 2004   Painless hematuria   Short gut syndrome    Bowel resection , 2004   Upper GI bleed 2004   Multiple episodes of melena-? due to gastritis or adverse drug effect (nonsteroidals, small bowel ulceration with Fosamax); caused by Pepto-Bismol during one Emergency Department evaluation   Past Surgical History:  Past Surgical History:  Procedure Laterality Date   ABDOMINAL HYSTERECTOMY     emergency s/p delivery   ABDOMINAL HYSTERECTOMY  1960   massive gynecologic bleeding   BOWEL RESECTION     Resulting short gut syndrome   CHOLECYSTECTOMY N/A 06/25/2018   Procedure: LAPAROSCOPIC CHOLECYSTECTOMY WITH INTRAOPERATIVE CHOLANGIOGRAM;  Surgeon: Gerkin, Todd, MD;  Location: [facility];  Service: General;  Laterality: N/A;   COLONOSCOPY W/ POLYPECTOMY  2005   Lipoma; diverticulosis   COLONOSCOPY WITH ESOPHAGOGASTRODUODENOSCOPY (EGD)  11/22/2012    Rehman   CYSTOSCOPY W/ RETROGRADES Bilateral 04/16/2022   Procedure: CYSTOSCOPY;  Surgeon: Stoneking, Bradley J., MD;  Location: [facility];  Service: Urology;  Laterality: Bilateral;   ESOPHAGOGASTRODUODENOSCOPY (EGD) WITH PROPOFOL N/A 04/04/2022   Procedure: ESOPHAGOGASTRODUODENOSCOPY (EGD) WITH PROPOFOL;  Surgeon: Rehman, Najeeb U, MD;  Location: [facility];  Service: Endoscopy;  Laterality: N/A;  210   HIP ARTHROPLASTY Right 06/15/2020   Procedure: ANTERIOR ARTHROPLASTY BIPOLAR HIP (HEMIARTHROPLASTY);  Surgeon: Blackman, Christopher Y, MD;  Location: [facility];  Service: Orthopedics;  Laterality: Right;   LAPAROSCOPIC LYSIS OF ADHESIONS  1965   s/p adhesions   MASTECTOMY  right breast   MASTECTOMY     Carcinoma of the breast; right   TRANSURETHRAL RESECTION OF BLADDER TUMOR N/A 04/16/2022   Procedure: TRANSURETHRAL RESECTION OF BLADDER TUMOR (TURBT);  Surgeon: Stoneking, Bradley J., MD;  Location: [facility];  Service: Urology;  Laterality: N/A;   UPPER GASTROINTESTINAL ENDOSCOPY      Assessment / Plan / Recommendation Clinical Impression HPI: Patient is a 88 y.o. year old female with history of AF, R- MRM for breast cancer, CKD IV, R- staghorn calculus, TURBT (Dr. Stoneking) for bladder CA, abdominal adhesions, short gut syndrome who was admitted via [facility] on 05/13/22 after found on the floor by a neighbor with laceration on the back of her head. She was found to have right parieto-occipital scalp hematoma, small acute SAH overlying anterolateral  left frontal lobe and small SAH along anteroinferior B-fronal lobe with small hemorrhagic contusions   right temporal lobe and acute SDH along left tentorium, cervical stenosis C3/4 with advanced DDD C3-C7, suspected fracture right lateral 2nd rib. Patient reported some HA as well as dizziness. Dr. Annette Stable evaluated patient and recommended conservative management as without neurologic symptoms.    She has had issues with bouts of lethargy reported numbness and tingling  in bilateral hands and feet as well as sandpaper feeling in her hands. MRI C- spine ordered for work up on 06/02 to rule out nerve impingement given cervical stenosis. This revealed mild edema in posterior paraspinous soft tissue with thin hairline fracture involving left occipital calvarium extending towards jugular foramen and degenerative spondylosis C3/4 and C4/5 with moderate to severe spinal stenosis with flattening of cord (no signal changes).  C -collar recommended for support.  Palliative care consulted to discuss [place] and patient elected on DNR. Exam by Dr. Annette Stable revealed some mild weakness in grips/intrinsic function bilaterally with patchy sensory loss in BUE/BLE felt to be due to myelopathy secondary to fall in setting of severe cervical spondylosis and monitoring recommended.  She continues to be limited by pain, ataxic gait with left lean, decreased recall with problems sequencing  as well as impaired sensation in bilateral feet. CIR recommended due to functional decline. She states she is very depressed regarding her current limitations.  Patient transferred to CIR on 05/17/2022.  SLP consulted to complete cognitive-linguistic evaluation in the setting of acute SAH and SDH s/p fall. Pt received awake, though lethargic and sitting upright in bed with soft collar donned. Endorses depressed mood due to current circumstances, and voiced concerns to MD during morning rounds. Agreeable to ST intervention.   Overall, pt presents with at least mild to moderate cognitive-linguistic deficits in the domains of attention, recall, language (word finding) and executive functioning. Receptive language appears intact for single and multi-step commands. Expressively, pt demonstrates functional communication via verbal means despite intermittent word finding difficulties, which pt endorses were present prior to hospitalization. Despite acute onset of strained vocal quality, pt's speech is fully intelligible and  fluent. Benefited from increased vocal volume from communication partner (hx of tinnitus per pt report - no HA's), extended processing time, and slowed speech. Brief assessment of thin liquid intake appeared unremarkable for orofacial weakness and oropharyngeal dysphagia. Pt declined solid textures despite encouragement. Implemented small sips with thin liquid via straw with Mod I. Denies dysphagia s/p fall, and per chart review, tolerated regular diet textures and thin liquids during acute admission.  Prior to admission, pt reports living alone and completing all iADL's independently to include driving, cooking, cleaning, and medication + financial management. Does endorse feeling depressed s/p fall and is amenable to discussing her emotional state with the hospital chaplain and neuropsychologist; MD aware. Supportive listening and validation provided throughout.   Given pt's current level of functioning and lifestyle PTA, pt would benefit from skilled ST intervention targeting goals outlined below and in care plan to maximize her independence and decreased caregiver burden at time of discharge. Results and recommendations reviewed with pt who is in agreement with proposed plan. Please see below for additional details.   Skilled Therapeutic Interventions          Cognitive-linguistic evaluation completed; COGNISTAT administered. Please see above for details.  SLP Assessment  Patient will need skilled Speech Lanaguage Pathology Services during CIR admission    Recommendations  SLP Diet Recommendations: Age appropriate regular solids;Thin Liquid Administration via: Cup;Straw Medication Administration: Whole meds with liquid Supervision: Patient able to self feed;Intermittent supervision  to cue for compensatory strategies Compensations: Minimize environmental distractions;Slow rate;Small sips/bites Postural Changes and/or Swallow Maneuvers: Seated upright 90 degrees Oral Care Recommendations: Oral  care BID Recommendations for Other Services: Neuropsych consult Patient destination: Home Follow up Recommendations: Outpatient SLP;Home Health SLP;24 hour supervision/assistance Equipment Recommended: None recommended by SLP    SLP Frequency 3 to 5 out of 7 days   SLP Duration  SLP Intensity  SLP Treatment/Interventions 3 weeks  Minumum of 1-2 x/day, 30 to 90 minutes  Cognitive remediation/compensation;Functional tasks;Internal/external aids;Environmental controls;Patient/family education;Medication managment    Pain Pain Assessment Pain Scale: 0-10 Pain Score: 0-No pain  Prior Functioning Cognitive/Linguistic Baseline: Within functional limits Type of Home: House  Lives With: Alone Available Help at Discharge: Family;Available 24 hours/day Education: 12th grade Vocation: Retired  SLP Evaluation Cognition Overall Cognitive Status: Impaired/Different from baseline Arousal/Alertness: Lethargic Orientation Level: Oriented to person;Oriented to place;Oriented to situation;Oriented to time (COGNISTAT orientation subtest score - 10/12 - WFL) Year: 2023 Month: June Day of Week: Incorrect Attention: Focused;Sustained (COGNISTAT attention subtest score  - 4/8 - mild to moderate impairment) Focused Attention: Appears intact Sustained Attention: Impaired Sustained Attention Impairment: Verbal basic;Functional basic Memory: Impaired (COGNISTAT registration score - 5 - moderate impairment; delayed recall subtest score - 7/12 - mild to moderate impairment) Memory Impairment: Decreased short term memory;Storage deficit Decreased Short Term Memory: Functional basic;Verbal basic Awareness: Impaired Awareness Impairment: Emergent impairment, Anticipatory impairment Problem Solving: Impaired (COGNISTAT mental math calculations subtest score - 2/4 - mild impairment ; verbal judgment subtest score - 3/6 - mild impairment) Problem Solving Impairment: Verbal basic;Verbal complex Executive  Function: Self Correcting Reasoning: Appears intact Self Correcting: Impaired Self Correcting Impairment: Functional basic;Verbal basic Safety/Judgment: Impaired Rancho Los Amigos Scales of Cognitive Functioning: Purposeful/appropriate   Comprehension Auditory Comprehension Overall Auditory Comprehension: Appears within functional limits for tasks assessed Yes/No Questions: Within Functional Limits Commands: Within Functional Limits Conversation: Complex Interfering Components: Hearing;Working memory;Processing speed EffectiveTechniques: Extra processing time;Increased volume;Repetition;Slowed speech Visual Recognition/Discrimination Discrimination: Not tested Reading Comprehension Reading Status: Not tested  Expression Expression Primary Mode of Expression: Verbal Verbal Expression Overall Verbal Expression: Appears within functional limits for tasks assessed Initiation: No impairment Level of Generative/Spontaneous Verbalization: Conversation Repetition: Impaired (COGNISTAT repetition score - 10/12 - borderline WFL) Level of Impairment: Sentence level (likely due to decreased hearing acuity vs motor planning deficit) Naming: Impairment  Confrontation: Impaired (COGNISTAT naming subtest score - 5/8 - mild impairment) Pragmatics: No impairment Effective Techniques: Semantic cues;Sentence completion;Written cues Written Expression Dominant Hand: Right Written Expression: Not tested  Oral Motor Oral Motor/Sensory Function Overall Oral Motor/Sensory Function: Within functional limits Motor Speech Overall Motor Speech: Appears within functional limits for tasks assessed Respiration: Within functional limits Phonation: Other (comment) (Strained) Resonance: Within functional limits Articulation: Within functional limitis Intelligibility: Intelligible Motor Planning: Witnin functional limits Motor Speech Errors: Not applicable  Care Tool Care Tool Cognition Ability to  hear (with hearing aid or hearing appliances if normally used Ability to hear (with hearing aid or hearing appliances if normally used): 1. Minimal difficulty - difficulty in some environments (e.g. when person speaks softly or setting is noisy)   Expression of Ideas and Wants Expression of Ideas and Wants: 3. Some difficulty - exhibits some difficulty with expressing needs and ideas (e.g, some words or finishing thoughts) or speech is not clear   Understanding Verbal and Non-Verbal Content Understanding Verbal and Non-Verbal Content: 4. Understands (complex and basic) - clear comprehension without cues or repetitions  Memory/Recall Ability Memory/Recall Ability : That he   or she is in a hospital/hospital unit;Current season    Bedside Swallowing Assessment N/A - pt tolerating regular diet textures and thin liquids at time of admission to CIR.  Short Term Goals: Week 1: SLP Short Term Goal 1 (Week 1): Pt will sustain attention to simple functional task for 10 minutes with Min A. SLP Short Term Goal 2 (Week 1): Pt will recall at least 3 pieces of functional information pertaining to her daily events with use of external and internal aids (WRAP strategies) with Mod A. SLP Short Term Goal 3 (Week 1): Pt will participate in simple problem-solving tasks re: iADL's with 90% accuracy given Mod A. SLP Short Term Goal 4 (Week 1): Pt will complete functional divergent and convergent naming tasks with 90% accuracy given Mod A. SLP Short Term Goal 5 (Week 1): Pt will consume regular diet textures and thin liquids with implementation of safe swallowing strategies with Mod I.  Refer to Care Plan for Long Term Goals  Recommendations for other services: Neuropsych  Discharge Criteria: Patient will be discharged from SLP if patient refuses treatment 3 consecutive times without medical reason, if treatment goals not met, if there is a change in medical status, if patient makes no progress towards goals or if  patient is discharged from hospital.  The above assessment, treatment plan, treatment alternatives and goals were discussed and mutually agreed upon: by patient  Bethany A Lutes 05/19/2022, 12:51 PM   

## 2022-05-20 DIAGNOSIS — N184 Chronic kidney disease, stage 4 (severe): Secondary | ICD-10-CM | POA: Diagnosis not present

## 2022-05-20 DIAGNOSIS — S069XAS Unspecified intracranial injury with loss of consciousness status unknown, sequela: Secondary | ICD-10-CM | POA: Diagnosis not present

## 2022-05-20 DIAGNOSIS — K529 Noninfective gastroenteritis and colitis, unspecified: Secondary | ICD-10-CM | POA: Diagnosis not present

## 2022-05-20 DIAGNOSIS — C679 Malignant neoplasm of bladder, unspecified: Secondary | ICD-10-CM | POA: Diagnosis not present

## 2022-05-20 LAB — T4, FREE: Free T4: 1.14 ng/dL — ABNORMAL HIGH (ref 0.61–1.12)

## 2022-05-20 MED ORDER — ALPRAZOLAM 0.5 MG PO TABS
1.0000 mg | ORAL_TABLET | Freq: Every day | ORAL | Status: DC
  Administered 2022-05-20 – 2022-06-06 (×18): 1 mg via ORAL
  Filled 2022-05-20 (×18): qty 2

## 2022-05-20 NOTE — Progress Notes (Signed)
PROGRESS NOTE   Subjective/Complaints: Pt in a little better spirits today. Asked for something to help her sleep better. Said she took '1mg'$  of xanax at bedtime as opposed to the 0.'5mg'$  she is getting here. Feels that hands are "worse"  ROS: Patient denies fever, rash, sore throat, blurred vision, dizziness, nausea, vomiting, diarrhea, cough, shortness of breath or chest pain, joint or back/neck pain, headache, or mood change.    Objective:     No results found. Recent Labs    05/19/22 0612  WBC 9.8  HGB 10.8*  HCT 33.1*  PLT 190   Recent Labs    05/19/22 0612  NA 138  K 3.6  CL 111  CO2 20*  GLUCOSE 98  BUN 29*  CREATININE 1.65*  CALCIUM 8.9    Intake/Output Summary (Last 24 hours) at 05/20/2022 0942 Last data filed at 05/20/2022 0830 Gross per 24 hour  Intake 598 ml  Output --  Net 598 ml        Physical Exam: Vital Signs Blood pressure 111/79, pulse 73, temperature 98.1 F (36.7 C), resp. rate 16, height '5\' 1"'$  (1.549 m), weight 53.2 kg, SpO2 97 %. Constitutional: No distress . Vital signs reviewed. HEENT: NCAT, EOMI, oral membranes moist Neck: supple Cardiovascular: RRR without murmur. No JVD    Respiratory/Chest: CTA Bilaterally without wheezes or rales. Normal effort    GI/Abdomen: BS +, non-tender, non-distended Ext: no clubbing, cyanosis, or edema Psych: pleasant and cooperative, sl anxious Skin: occipital wound with scab, some blood smears on pillow Neuro: Alert and oriented x3. Decreased sensation in bilateral hands and ulnar side of wrist.  4/5 strength in upper extremities, 5/5 strength in lower extremities.  Musculoskeletal: no pain with limb rom.   Assessment/Plan: 1. Functional deficits which require 3+ hours per day of interdisciplinary therapy in a comprehensive inpatient rehab setting. Physiatrist is providing close team supervision and 24 hour management of active medical problems  listed below. Physiatrist and rehab team continue to assess barriers to discharge/monitor patient progress toward functional and medical goals  Care Tool:  Bathing    Body parts bathed by patient: Right arm, Left arm, Chest, Abdomen, Right upper leg, Left upper leg, Face   Body parts bathed by helper: Front perineal area, Buttocks, Right lower leg, Left lower leg     Bathing assist Assist Level: Moderate Assistance - Patient 50 - 74%     Upper Body Dressing/Undressing Upper body dressing   What is the patient wearing?: Button up shirt    Upper body assist Assist Level: Moderate Assistance - Patient 50 - 74%    Lower Body Dressing/Undressing Lower body dressing      What is the patient wearing?: Incontinence brief, Pants     Lower body assist Assist for lower body dressing: Maximal Assistance - Patient 25 - 49%     Toileting Toileting    Toileting assist Assist for toileting: 2 Helpers     Transfers Chair/bed transfer  Transfers assist     Chair/bed transfer assist level: Total Assistance - Patient < 25%     Locomotion Ambulation   Ambulation assist      Assist level: Maximal Assistance -  Patient 25 - 49% Assistive device: Hand held assist Max distance: 3 ft   Walk 10 feet activity   Assist  Walk 10 feet activity did not occur: Safety/medical concerns        Walk 50 feet activity   Assist Walk 50 feet with 2 turns activity did not occur: Safety/medical concerns         Walk 150 feet activity   Assist Walk 150 feet activity did not occur: Safety/medical concerns         Walk 10 feet on uneven surface  activity   Assist Walk 10 feet on uneven surfaces activity did not occur: Safety/medical concerns         Wheelchair     Assist Is the patient using a wheelchair?: Yes Type of Wheelchair: Manual    Wheelchair assist level: Dependent - Patient 0%      Wheelchair 50 feet with 2 turns activity    Assist         Assist Level: Dependent - Patient 0%   Wheelchair 150 feet activity     Assist      Assist Level: Dependent - Patient 0%   Blood pressure 111/79, pulse 73, temperature 98.1 F (36.7 C), resp. rate 16, height '5\' 1"'$  (1.549 m), weight 53.2 kg, SpO2 97 %.  . Medical Problem List and Plan: 1. Functional deficits secondary to Central cord syndrome as well as TBI (SAH/SDH)             -patient may shower, ordered [place] collar for shower             -ELOS/Goals: 25 days            -Continue CIR therapies including PT, OT, and SLP. Interdisciplinary team conference today to discuss goals, barriers to discharge, and dc planning.   2.  Antithrombotics: -DVT/anticoagulation:  Pharmaceutical: Lovenox started pm 06/02              -antiplatelet therapy: N/a due to bleed.  3. Pain: continue Tylenol QID, Cymbalta '90mg'$  daily, gabapentin, tramadol 4. Mood: pt to provide ego support -pt is already on cymbalta and xanax as pta for anxiety disorder   -will adjust xanax to '1mg'$  she uses at home  - free T4 level pending -have asked neuropsych to see              -antipsychotic agents: N/A 5. Neuropsych: This patient is capable of making decisions on her own behalf. 6. Skin/Wound Care: Routine pressure relief measures.  7. Fluids/Electrolytes/Nutrition: encourage PO  I personally reviewed the patient's labs today.   8.  Seizure proph: On Keppra bid X 7 days per NS 9. L occipital calvarium Fx: Soft collar at all times with outpatient follow up w/Dr. Annette Stable 10. R- 2nd rib Fx: Encourage IS 11. 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   Advised her that we are following at her sensory symptoms might wax and wane a bit.  -I don't see any neuro changes on exam 12. CKD stage IV: Followed by Dr. Theador Hawthorne Baseline SCr- --sl bump in BUN.  no changes to regimen - Continue sodium bicarb 13.  H/o UGIB/Iron deficiency anemia/HH: Add iron supplement. B12 reviewed and is stable.  14. TURBT 04/16/2022 for invasive high grade bladder CA: h/o dysuria/frequency --was referred for palliative XRT by Dr. Tammi Klippel.  --6/6 spoke to Dr. Tammi Klippel, likely televisit Friday 6/9  16. 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  17. Vitamin D deficiency: started ergocalciferol 50,000U once per week for 7 weeks 18. BPPV: changed meclizine to tid prn  -vestibular evaluation with therapy  LOS: 3 days A FACE TO FACE EVALUATION WAS PERFORMED  Meredith Staggers 05/20/2022, 9:42 AM

## 2022-05-20 NOTE — Patient Care Conference (Signed)
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 Alger Simons Social Worker Present: Loralee Pacas, [place] Nurse Present: Dorthula Nettles, RN PT Present: Apolinar Junes, PT OT Present: Laverle Hobby, OT SLP Present: Weston Anna, SLP PPS Coordinator present : Ileana Ladd, PT     Current Status/Progress Goal Weekly Team Focus  Bowel/Bladder   continent to bowel with period of incontinent bladder episodes  to be 100% contintent  encourage toileting   Swallow/Nutrition/ Hydration   Regular textures with thin liquids, Mod I  Mod I  tolerance of current diet   ADL's   mod-max A overall, poor grasp  supervision overall  ADLs, transfers, BUE strengthening, safety, cognition   Mobility   min-mod A overall, gait 25 ft with RW and ataxia, vestibular symptoms on eval  Supervision-CGA overall  Balance, functional mobility, motor planning, gait and stair training, activity tolerance, sensory integration, safety awareness, vestibular treatment as appropriate, patient/caregiver education   Communication   Min A  Supervision  word-finding strategies   Safety/Cognition/ Behavioral Observations  Min A  Supervision  complex problem solving, recall with use of strategies, sustained attention, emeregent awareness   Pain    pt c/o pain 2/10, controlled on current regimen  pain <3  Assess every shift and prn   Skin   abraison posterior head, bilateral arms and legs  wounds to show signs of healing  assess every shift     Discharge Planning:  D/c to home with support from her niece [name] (retiring at the end of the month) and hired caregiver support.   Team Discussion: TBI, SDH, SAH, Central Cord Syndrome r/t fall. Anxiety/depression. Tele visit with Oncology on Friday. Scheduled with Dr. Sima Matas on Friday. Not sleeping well , numbness/tingling in hands. Adjusting medications. Continent/incontinent B/B. Frequent BM's in AM. No reported pain. Incision CDI, redness to heels, foam applied. Discharging home, niece to provide care.   Patient on target to meet rehab goals: yes, supervision/CGA goals. Currently mod/max assist overall ADL's. Min/mod overall mobility. Initiated gait training. Mild cognitive deficits. Working on [employer], problem solving.  *See Care Plan and progress notes for long and short-term goals.   Revisions to Treatment Plan:  Adjusting medications, therapy may downgrade goals.   Teaching Needs: Family education, medication management, bowel/bladder management, skin/wound care, transfer/gait training, etc.   Current Barriers to Discharge: Decreased caregiver support, Home enviroment access/layout, Incontinence, Wound care, and Lack of/limited family support  Possible Resolutions to Barriers: Family education Follow-up therapy Order recommended DME     Medical Summary Current Status: cervical central cord, tbi, bladder cancer awaiting palliative xrt. anxiety/depression  Barriers to Discharge: Medical stability   Possible Resolutions to [employer] Focus: daily assessment of neuro exam, anxiety rx, rad-onc consult   Continued Need for Acute Rehabilitation Level of Care: The  patient requires daily medical management by a physician with specialized training in physical medicine and  rehabilitation for the following reasons: Direction of a multidisciplinary physical rehabilitation program to maximize functional independence : Yes Medical management of patient stability for increased activity during participation in an intensive rehabilitation regime.: Yes Analysis of laboratory values and/or radiology reports with any subsequent need for medication adjustment and/or medical intervention. : Yes   I attest that I was present, lead the team conference, and concur with the assessment and plan of the team.   Cristi Loron 05/20/2022, 1:31 PM

## 2022-05-20 NOTE — Progress Notes (Signed)
While donning ted hose, found small intact blister on left shin. Covered with foam. Sheela Stack, LPN

## 2022-05-20 NOTE — Progress Notes (Signed)
Patient ID: Jane Andrews, female   DOB: 1925/06/09, 88 y.o.   MRN: [id number]  SW met with pt and pt niece [name] in room to provide updates from team conference, d/c date 6/23, and upcoming tele-visit with oncology on this Friday. She had not received a phone call yet about this appt. SW discussed scheduling family edu closer towards d/c, and SW will continue to provide updates.  Loralee Pacas, MSW, [place] Office: [phone] Cell: [phone] Fax: [phone]

## 2022-05-20 NOTE — Progress Notes (Signed)
Speech Language Pathology TBI Note  Patient Details  Name: Jane Andrews MRN: [id number] Date of Birth: [birth year]  Today's Date: 05/20/2022 SLP Individual Time: 8938-1017 SLP Individual Time Calculation (min): 40 min  Short Term Goals: Week 1: SLP Short Term Goal 1 (Week 1): Pt will sustain attention to simple functional task for 10 minutes with Min A. SLP Short Term Goal 2 (Week 1): Pt will recall at least 3 pieces of functional information pertaining to her daily events with use of external and internal aids (WRAP strategies) with Mod A. SLP Short Term Goal 3 (Week 1): Pt will participate in simple problem-solving tasks re: iADL's with 90% accuracy given Mod A. SLP Short Term Goal 4 (Week 1): Pt will complete functional divergent and convergent naming tasks with 90% accuracy given Mod A. SLP Short Term Goal 5 (Week 1): Pt will consume regular diet textures and thin liquids with implementation of safe swallowing strategies with Mod I.  Skilled Therapeutic Interventions: Skilled treatment session focused on cognitive goals. SLP facilitated session by providing supervision level verbal cues for functional problem solving during a basic money and medication management task. Patient demonstrated sustained attention to tasks for ~30 minutes with supervision level verbal cues for redirection. Patient was overall Mod I for word-finding during a basic, functional conversation. Patient left upright in bed with alarm on and all needs within reach. Continue with current plan of care.      Pain No/Denies Pain   Agitated Behavior Scale: TBI Observation Details Observation Environment: CIR Start of observation period - Date: 05/20/22 Start of observation period - Time: 0905 End of observation period - Date: 05/20/22 End of observation period - Time: 0945 Agitated Behavior Scale (DO NOT LEAVE BLANKS) Short attention span, easy distractibility, inability to concentrate: Absent Impulsive,  impatient, low tolerance for pain or frustration: Absent Uncooperative, resistant to care, demanding: Absent Violent and/or threatening violence toward people or property: Absent Explosive and/or unpredictable anger: Absent Rocking, rubbing, moaning, or other self-stimulating behavior: Absent Pulling at tubes, restraints, etc.: Absent Wandering from treatment areas: Absent Restlessness, pacing, excessive movement: Absent Repetitive behaviors, motor, and/or verbal: Absent Rapid, loud, or excessive talking: Absent Sudden changes of mood: Absent Easily initiated or excessive crying and/or laughter: Absent Self-abusiveness, physical and/or verbal: Absent Agitated behavior scale total score: 14  Therapy/Group: Individual Therapy  Anael Rosch 05/20/2022, 3:30 PM

## 2022-05-20 NOTE — Progress Notes (Signed)
Physical Therapy Session Note  Patient Details  Name: Jane Andrews MRN: [id number] Date of Birth: [birth year]  Today's Date: 05/20/2022 PT Individual Time: 1300-1345 PT Individual Time Calculation (min): 45 min   Short Term Goals: Week 1:  PT Short Term Goal 1 (Week 1): Patient will perform basic transfers with equal to or better than mod A consistently. PT Short Term Goal 2 (Week 1): Patient will ambulate >50 fet using LRAD with mod A. PT Short Term Goal 3 (Week 1): Patient will initiate stair training.  Skilled Therapeutic Interventions/Progress Updates:     Patient in bed with family in the room upon PT arrival. Patient alert and agreeable to PT session. Patient reported 6/10 head pain (TTP not described as headache) during session, RN made aware. PT provided repositioning, rest breaks, and distraction as pain interventions throughout session. Patient also stated that she felt generally, "unwell." Provided education on general symptoms and recovery from TBI. Patient reports continued, but improved dizziness with mobility throughout the day. Patient did report some "lightheadedness" in standing earlier today.   Orthostatic Vitals: Supine: BP 113/71, HR 79  Sitting: BP 121/74, HR 88 Standing: BP 100/51, HR 104 (symptomatic, unable to remain standing) LPN made aware, received orders for thigh hight TED hose and donned with total A. Educated patient and family on orthostatic hypotension and encouraged patient to drink fluids throughout the day. Patient and family receptive to education.   Therapeutic Activity: Bed Mobility: Patient performed supine to/from sit with min-mod A for trunk support with HOB slightly elevated. Provided verbal cues for sequencing and progressing through side-lying to maintain cervical precautions. Patient sat EOB with CGA-supervision >5 min with mild posterior lean with fatigue.  Transfers: Patient performed sit to/from stand from the bed with mod progressing  to min A with a RW. Provided verbal cues for hand placement and forward weight shift.  Patient in bed with CSW and her niece at bedside at end of session with breaks locked, bed alarm set, and all needs within reach.   Therapy Documentation Precautions:  Precautions Precautions: Fall, Cervical Precaution Booklet Issued: No Precaution Comments: verbal review of neck precautions, will need reinforcement Required Braces or Orthoses: Cervical Brace Cervical Brace: Soft collar Restrictions Weight Bearing Restrictions: No Other Position/Activity Restrictions: 2nd rib fx    Therapy/Group: Individual Therapy  Marvin Grabill L Tauna Macfarlane PT, DPT  05/20/2022, 4:22 PM

## 2022-05-20 NOTE — Progress Notes (Signed)
Physical Therapy TBI Note  Patient Details  Name: Jane Andrews MRN: [id number] Date of Birth: March 28, 1925  Today's Date: 05/20/2022 PT Individual Time: 0946-1030 PT Individual Time Calculation (min): 44 min   Short Term Goals: Week 1:  PT Short Term Goal 1 (Week 1): Patient will perform basic transfers with equal to or better than mod A consistently. PT Short Term Goal 2 (Week 1): Patient will ambulate >50 fet using LRAD with mod A. PT Short Term Goal 3 (Week 1): Patient will initiate stair training.  Skilled Therapeutic Interventions/Progress Updates:     Pt received supine in bed and agrees to therapy. No complaint of pain initially. PT assists pt to position legs in hooklying and pt performs supine bridge to assist with donning pants. Supine to R sidelying with cues for hand placement and body mechanics. Pt performs sidelying to sitting with minA/modA facilitation at trunk. Stand step transfer to [facility] with modA and cues for sequencing and positioning. WC transport to gym for time management. Pt completes standing activities in parallel bars for NMR and balance training. Pt stands in parallel bars with minA/modA and turns to face mirror for visual feedback. Both hands supported on one bar. Pt performs marching in place with modA required overall for balance with tendency for L lateral lean outside base of support. Able to correct with multimodal cueing and can attain static standing with CGA for short periods of time. Pt reports some pain in L foot. Number not provided. PT provides rest breaks to manage pain. Pt attempts marching in place but has notable ataxia in bilateral lower extremities and L lateral lean. Following seated rest break, pt performs ambulation with cones at midline for physical barrier to assist with maintaining wide BOS. Pt contacts cones with both legs but seems to respond well to visual cue. X10' total ambulation with modA. WC transport back to room. Pt left seated in WC  with alarm intact and all needs within reach.  Therapy Documentation Precautions:  Precautions Precautions: Fall, Cervical Precaution Booklet Issued: No Precaution Comments: verbal review of neck precautions, will need reinforcement Required Braces or Orthoses: Cervical Brace Cervical Brace: Soft collar Restrictions Weight Bearing Restrictions: No Other Position/Activity Restrictions: 2nd rib fx  Agitated Behavior Scale: TBI Observation Details Observation Environment: CIR Start of observation period - Date: 05/20/22 Start of observation period - Time: 0905 End of observation period - Date: 05/20/22 End of observation period - Time: 0945 Agitated Behavior Scale (DO NOT LEAVE BLANKS) Short attention span, easy distractibility, inability to concentrate: Absent Impulsive, impatient, low tolerance for pain or frustration: Absent Uncooperative, resistant to care, demanding: Absent Violent and/or threatening violence toward people or property: Absent Explosive and/or unpredictable anger: Absent Rocking, rubbing, moaning, or other self-stimulating behavior: Absent Pulling at tubes, restraints, etc.: Absent Wandering from treatment areas: Absent Restlessness, pacing, excessive movement: Absent Repetitive behaviors, motor, and/or verbal: Absent Rapid, loud, or excessive talking: Absent Sudden changes of mood: Absent Easily initiated or excessive crying and/or laughter: Absent Self-abusiveness, physical and/or verbal: Absent Agitated behavior scale total score: 14    Therapy/Group: Individual Therapy  Breck Coons, PT, DPT 05/20/2022, 6:17 PM

## 2022-05-20 NOTE — IPOC Note (Signed)
Overall Plan of Care [facility]) Patient Details Name: Jane Andrews MRN: [id number] DOB: [birth year]  Admitting Diagnosis: Central cord syndrome of cervical spinal cord [facility])  Hospital Problems: Principal Problem:   Central cord syndrome of cervical spinal cord ([place]) Active Problems:   TBI (traumatic brain injury) ([place])     Functional Problem List: Nursing Behavior, Bladder, Bowel, Edema, Endurance, Medication Management, Pain, Safety, Motor, Sensory  PT Balance, Perception, Behavior, Safety, Edema, Sensory, Endurance, Skin Integrity, Motor, Nutrition, Pain  OT Balance, Pain, Perception, Cognition, Safety, Sensory, Endurance, Motor, Skin Integrity  SLP Cognition  TR         Basic ADL's: OT Eating, Grooming, Bathing, Dressing, Toileting     Advanced  ADL's: OT       Transfers: PT Bed Mobility, Bed to Chair, Car, Manufacturing systems engineer, Metallurgist: PT Ambulation, Emergency planning/management officer, Stairs     Additional Impairments: OT Fuctional Use of Upper Extremity  SLP Social Cognition   Problem Solving, Memory, Attention, Awareness  TR      Anticipated Outcomes Item Anticipated Outcome  Self Feeding supervision  Swallowing  N/A   Basic self-care  supervision  Toileting  supervision   Bathroom Transfers supervision  Bowel/Bladder  min assist  Transfers  Supervision using LRAD  Locomotion  CGA using LRAD  Communication  Sup A  Cognition  Sup A  Pain  < 3  Safety/Judgment  min assist   Therapy Plan: PT Intensity: Minimum of 1-2 x/day ,45 to 90 minutes PT Frequency: 5 out of 7 days PT Duration Estimated Length of Stay: 3 weeks OT Intensity: Minimum of 1-2 x/day, 45 to 90 minutes OT Frequency: 5 out of 7 days OT Duration/Estimated Length of Stay: ~21 days SLP Intensity: Minumum of 1-2 x/day, 30 to 90 minutes SLP Frequency: 3 to 5 out of 7 days SLP Duration/Estimated Length of Stay: 3 weeks   Team Interventions: Nursing Interventions  Patient/Family Education, Bladder Management, Bowel Management, Disease Management/Prevention, Pain Management, Medication Management, Discharge Planning  PT interventions Ambulation/gait training, Cognitive remediation/compensation, Discharge planning, DME/adaptive equipment instruction, Functional mobility training, Pain management, Psychosocial support, Splinting/orthotics, Therapeutic Activities, UE/LE Strength taining/ROM, Visual/perceptual remediation/compensation, Wheelchair propulsion/positioning, UE/LE Coordination activities, Therapeutic Exercise, Stair training, Skin care/wound management, Patient/family education, Neuromuscular re-education, Functional electrical stimulation, Disease management/prevention, Academic librarian, Training and development officer  OT Interventions Training and development officer, Discharge planning, Functional electrical stimulation, Pain management, Self Care/advanced ADL retraining, Therapeutic Activities, UE/LE Coordination activities, Cognitive remediation/compensation, Disease mangement/prevention, Functional mobility training, Patient/family education, Therapeutic Exercise, Skin care/wound managment, Community reintegration, Engineer, drilling, Neuromuscular re-education, Psychosocial support, Splinting/orthotics, UE/LE Strength taining/ROM, Wheelchair propulsion/positioning, Visual/perceptual remediation/compensation  SLP Interventions Cognitive remediation/compensation, Functional tasks, Internal/external aids, Environmental controls, Patient/family education, Medication managment  TR Interventions    SW/CM Interventions Discharge Planning, Psychosocial Support, Patient/Family Education   Barriers to Discharge MD  Medical stability  Nursing Decreased caregiver support, Home environment access/layout, Incontinence, Lack of/limited family support, Behavior 2 level, 3 steps, 2 rails. Bedroom/bathroom upstairs, 1/2 bath main level. 20 steps to 2nd  level, right rail. Lives alone, niece can assist/hire caregiver.  PT Inaccessible home environment, Home environment access/layout, Decreased caregiver support, Incontinence, Lack of/limited family support, Behavior    OT      SLP Decreased caregiver support    SW Decreased caregiver support, Lack of/limited family support     Team Discharge Planning: Destination: PT-Home ,OT- Home , SLP-Home Projected Follow-up: PT-Home health PT, Outpatient PT (TBD based on patient's progress), OT-  Outpatient OT,  SLP-Outpatient SLP, Home Health SLP, 24 hour supervision/assistance Projected Equipment Needs: PT-To be determined, OT- None recommended by OT, SLP-None recommended by SLP Equipment Details: PT-Patient has a RW and SPC from previous hip fx, OT-  Patient/family involved in discharge planning: PT- Patient,  OT-Patient, Family member/caregiver, SLP-Patient  MD ELOS: 3 weeks Medical Rehab Prognosis:  Excellent Assessment: The patient has been admitted for CIR therapies with the diagnosis of cervical central cord, TBI. The team will be addressing functional mobility, strength, stamina, balance, safety, adaptive techniques and equipment, self-care, bowel and bladder mgt, patient and caregiver education, NMR, cognition, ego support, visual-spatial awareness. Community re-entry. Goals have been set at supervision to contact guard assist. Anticipated discharge destination is home with supports. .        See Team Conference Notes for weekly updates to the plan of care

## 2022-05-20 NOTE — Plan of Care (Signed)
Behavioral Plan   Rancho Level: Rancho 7-8  Behavior to decrease/ eliminate: execute the safety plan   Changes to environment:  -Lights on, blinds open during the day; off and closed at night  Interventions: - ensure pt has soft call light in reach - belt alarm - offer TIS or reclining chair for OOB tolerance - soft collar at all times for [place] collar for showering - offer built up handles for feeding  Recommendations for interactions with patient: - offer OOB toileting especially in morning with BSC at EOB to decrease bowel incontinence; but do not leave alone with use of stedy PRN  Attendees:  Neldon Labella

## 2022-05-20 NOTE — Progress Notes (Signed)
Occupational Therapy TBI Note  Patient Details  Name: Jane Andrews MRN: [id number] Date of Birth: 10/02/1925  Today's Date: 05/20/2022 OT Individual Time: 1400-1430 OT Individual Time Calculation (min): 30 min    Short Term Goals: Week 1:  OT Short Term Goal 1 (Week 1): Pt will transfer to [facility] with mod A consistently OT Short Term Goal 2 (Week 1): Pt will be able to tolerate sitting EOB to perform UB ADL with close supervision OT Short Term Goal 3 (Week 1): Pt  will thread underwear/ brief and pants with min A witout AE  Skilled Therapeutic Interventions/Progress Updates:     Pt received in bed with no pain   Therapeutic exercise Pt completes 2x10-15 dowel rod therex for BUE shoulder strengthening required for BADLs/functional transfers as follows with demo cuing and 1 # dowel rod  Shoulder flex/ext, Circles in B directions Elbow flex/ext, chest press Wrist flex/ext  Pt left at end of session in bed with exit alarm on, call light in reach and all needs met   Therapy Documentation Precautions:  Precautions Precautions: Fall, Cervical Precaution Booklet Issued: No Precaution Comments: verbal review of neck precautions, will need reinforcement Required Braces or Orthoses: Cervical Brace Cervical Brace: Soft collar Restrictions Weight Bearing Restrictions: No Other Position/Activity Restrictions: 2nd rib fx General:  Agitated Behavior Scale: TBI  Observation Details Observation Environment: CIR Start of observation period - Date: 05/20/22 Start of observation period - Time: 1400 End of observation period - Date: 05/20/22 End of observation period - Time: 1430 Agitated Behavior Scale (DO NOT LEAVE BLANKS) Short attention span, easy distractibility, inability to concentrate: Absent Impulsive, impatient, low tolerance for pain or frustration: Absent Uncooperative, resistant to care, demanding: Absent Violent and/or threatening violence toward people or property:  Absent Explosive and/or unpredictable anger: Absent Rocking, rubbing, moaning, or other self-stimulating behavior: Absent Pulling at tubes, restraints, etc.: Absent Wandering from treatment areas: Absent Restlessness, pacing, excessive movement: Absent Repetitive behaviors, motor, and/or verbal: Absent Rapid, loud, or excessive talking: Absent Sudden changes of mood: Absent Easily initiated or excessive crying and/or laughter: Absent Self-abusiveness, physical and/or verbal: Absent Agitated behavior scale total score: 14  ATherapy/Group: Individual Therapy  Tonny Branch 05/20/2022, 3:08 PM

## 2022-05-20 NOTE — Progress Notes (Signed)
   05/20/22 1340  Orthostatic Lying   BP- Lying 113/71  Pulse- Lying 79  Orthostatic Sitting  BP- Sitting 121/74  Pulse- Sitting 88  Orthostatic Standing at 3 minutes  BP- Standing at 3 minutes 100/51  Pulse- Standing at 3 minutes 104   PA Olin Hauser notified of results. New orders received for abdominal binder and teds

## 2022-05-20 NOTE — Progress Notes (Signed)
Occupational Therapy TBI Note  Patient Details  Name: Jane Andrews MRN: [id number] Date of Birth: September 16, 1925  Today's Date: 05/20/2022 OT Individual Time: 1030-1100 OT Individual Time Calculation (min): 30 min    Short Term Goals: Week 1:  OT Short Term Goal 1 (Week 1): Pt will transfer to [facility] with mod A consistently OT Short Term Goal 2 (Week 1): Pt will be able to tolerate sitting EOB to perform UB ADL with close supervision OT Short Term Goal 3 (Week 1): Pt  will thread underwear/ brief and pants with min A witout AE  Skilled Therapeutic Interventions/Progress Updates:    Pt received sitting up in the w/c with no c/o pain, agreeable to OT session. She endorsed a depressed mood and feelings of sadness re her condition. Provided encouragement and emotional support. She was taken via w/c to the therapy gym. Worked on theraputty activities with her BUE to work on [employer]. Used level 1 soft theraputty and instructed pt in intrinsic hand strengthening activities, composite finger flexion and extension strengthening, and pincer grasp. She required min cueing for positioning. She then completed a pipe tree puzzle with a near model, requiring min cueing to sequence completion and had some difficulty with bimanual integration. Provided pt with two built up handles as her previous one was taken by nutrition staff accidentally. She returned to her room and completed a stand pivot transfer back to bed with min A. She was left supine with all needs met, bed alarm set. Fashioned a ring with a towel to promote pressure relief from sore draining on her posterior scalp.    Therapy Documentation Precautions:  Precautions Precautions: Fall, Cervical Precaution Booklet Issued: No Precaution Comments: verbal review of neck precautions, will need reinforcement Required Braces or Orthoses: Cervical Brace Cervical Brace: Soft collar Restrictions Weight Bearing Restrictions: No Other Position/Activity  Restrictions: 2nd rib fx  Agitated Behavior Scale: TBI Observation Details Observation Environment: CIR Start of observation period - Date: 05/20/22 Start of observation period - Time: 1030 End of observation period - Date: 05/20/22 End of observation period - Time: 1100 Agitated Behavior Scale (DO NOT LEAVE BLANKS) Short attention span, easy distractibility, inability to concentrate: Absent Impulsive, impatient, low tolerance for pain or frustration: Absent Uncooperative, resistant to care, demanding: Absent Violent and/or threatening violence toward people or property: Absent Explosive and/or unpredictable anger: Absent Rocking, rubbing, moaning, or other self-stimulating behavior: Absent Pulling at tubes, restraints, etc.: Absent Wandering from treatment areas: Absent Restlessness, pacing, excessive movement: Absent Repetitive behaviors, motor, and/or verbal: Absent Rapid, loud, or excessive talking: Absent Sudden changes of mood: Absent Easily initiated or excessive crying and/or laughter: Absent Self-abusiveness, physical and/or verbal: Absent Agitated behavior scale total score: 14    Therapy/Group: Individual Therapy  Curtis Sites 05/20/2022, 1:27 PM

## 2022-05-21 NOTE — Progress Notes (Signed)
PROGRESS NOTE   Subjective/Complaints: Seems to have slept better last night. Still complaining about her hands feeling gritty  ROS: Patient denies fever, rash, sore throat, blurred vision, dizziness, nausea, vomiting, diarrhea, cough, shortness of breath or chest pain, joint or back/neck pain, headache.    Objective:     No results found. Recent Labs    05/19/22 0612  WBC 9.8  HGB 10.8*  HCT 33.1*  PLT 190   Recent Labs    05/19/22 0612  NA 138  K 3.6  CL 111  CO2 20*  GLUCOSE 98  BUN 29*  CREATININE 1.65*  CALCIUM 8.9    Intake/Output Summary (Last 24 hours) at 05/21/2022 0851 Last data filed at 05/21/2022 0730 Gross per 24 hour  Intake 355 ml  Output --  Net 355 ml        Physical Exam: Vital Signs Blood pressure 139/64, pulse 67, temperature 97.9 F (36.6 C), resp. rate 18, height '5\' 1"'$  (1.549 m), weight 53.2 kg, SpO2 99 %. Constitutional: No distress . Vital signs reviewed. HEENT: NCAT, EOMI, oral membranes moist Neck: supple Cardiovascular: RRR without murmur. No JVD    Respiratory/Chest: CTA Bilaterally without wheezes or rales. Normal effort    GI/Abdomen: BS +, non-tender, non-distended Ext: no clubbing, cyanosis, or edema Psych: pleasant and cooperative, anxious Skin: occipital wound with scab  Neuro: Alert and oriented x3. Decreased sensation in bilateral hands and ulnar side of wrist.  4/5 strength in upper extremities, 5/5 strength in lower extremities.  Musculoskeletal: no pain with limb rom.   Assessment/Plan: 1. Functional deficits which require 3+ hours per day of interdisciplinary therapy in a comprehensive inpatient rehab setting. Physiatrist is providing close team supervision and 24 hour management of active medical problems listed below. Physiatrist and rehab team continue to assess barriers to discharge/monitor patient progress toward functional and medical goals  Care  Tool:  Bathing    Body parts bathed by patient: Right arm, Left arm, Chest, Abdomen, Right upper leg, Left upper leg, Face   Body parts bathed by helper: Front perineal area, Buttocks, Right lower leg, Left lower leg     Bathing assist Assist Level: Moderate Assistance - Patient 50 - 74%     Upper Body Dressing/Undressing Upper body dressing   What is the patient wearing?: Button up shirt    Upper body assist Assist Level: Moderate Assistance - Patient 50 - 74%    Lower Body Dressing/Undressing Lower body dressing      What is the patient wearing?: Incontinence brief, Pants     Lower body assist Assist for lower body dressing: Maximal Assistance - Patient 25 - 49%     Toileting Toileting    Toileting assist Assist for toileting: Moderate Assistance - Patient 50 - 74%     Transfers Chair/bed transfer  Transfers assist     Chair/bed transfer assist level: Total Assistance - Patient < 25%     Locomotion Ambulation   Ambulation assist      Assist level: Maximal Assistance - Patient 25 - 49% Assistive device: Hand held assist Max distance: 3 ft   Walk 10 feet activity   Assist  Walk 10  feet activity did not occur: Safety/medical concerns        Walk 50 feet activity   Assist Walk 50 feet with 2 turns activity did not occur: Safety/medical concerns         Walk 150 feet activity   Assist Walk 150 feet activity did not occur: Safety/medical concerns         Walk 10 feet on uneven surface  activity   Assist Walk 10 feet on uneven surfaces activity did not occur: Safety/medical concerns         Wheelchair     Assist Is the patient using a wheelchair?: Yes Type of Wheelchair: Manual    Wheelchair assist level: Dependent - Patient 0%      Wheelchair 50 feet with 2 turns activity    Assist        Assist Level: Dependent - Patient 0%   Wheelchair 150 feet activity     Assist      Assist Level: Dependent -  Patient 0%   Blood pressure 139/64, pulse 67, temperature 97.9 F (36.6 C), resp. rate 18, height '5\' 1"'$  (1.549 m), weight 53.2 kg, SpO2 99 %.  . Medical Problem List and Plan: 1. Functional deficits secondary to Central cord syndrome as well as TBI (SAH/SDH)             -patient may shower, ordered [place] collar for shower             -ELOS/Goals: 25 days            -Continue CIR therapies including PT, OT, and SLP  2.  Antithrombotics: -DVT/anticoagulation:  Pharmaceutical: Lovenox started pm 06/02              -antiplatelet therapy: N/a due to bleed.  3. Pain: continue Tylenol QID, Cymbalta '90mg'$  daily, gabapentin, tramadol 4. Mood: pt to provide ego support -pt is already on cymbalta and xanax as pta for anxiety disorder   -adjusted xanax to '1mg'$  qhs she uses at home  - free T4 level ok, acutally a bit high -have asked neuropsych to see              -antipsychotic agents: N/A 5. Neuropsych: This patient is capable of making decisions on her own behalf. 6. Skin/Wound Care: Routine pressure relief measures.  7. Fluids/Electrolytes/Nutrition: encourage PO  I personally reviewed the patient's labs today.   8.  Seizure proph: On Keppra bid X 7 days per NS 9. L occipital calvarium Fx: Soft collar at all times with outpatient follow up w/Dr. Annette Stable 10. R- 2nd rib Fx: Encourage IS 11. 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   Advised her that we are following at her sensory symptoms might wax and wane a bit.  -neuro exam stable 12. CKD stage IV: Followed by Dr. Theador Hawthorne Baseline SCr- --sl bump in BUN.  no changes to regimen - Continue sodium bicarb 13. H/o UGIB/Iron deficiency anemia/HH: Add iron supplement. B12 reviewed and is stable.  14. TURBT 04/16/2022 for invasive high grade bladder CA: h/o dysuria/frequency --was referred for palliative XRT, Dr. Tammi Klippel.  --6/6 spoke  to Dr. Tammi Klippel, likely televisit Friday 6/9   16. 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  17. Vitamin D deficiency: started ergocalciferol 50,000U once per week for 7 weeks 18. BPPV: changed meclizine to tid prn  -vestibular treatment limited d/t cervical issues  LOS:  4 days A FACE TO FACE EVALUATION WAS PERFORMED  Meredith Staggers 05/21/2022, 8:51 AM

## 2022-05-21 NOTE — Progress Notes (Addendum)
Occupational Therapy TBI Note  Patient Details  Name: Jane Andrews MRN: [id number] Date of Birth: 01-Aug-1925  Today's Date: 05/21/2022 OT Individual Time: 0105-0128 OT Individual Time Calculation (min): 23 min    Short Term Goals: Week 1:  OT Short Term Goal 1 (Week 1): Pt will transfer to [facility] with mod A consistently OT Short Term Goal 2 (Week 1): Pt will be able to tolerate sitting EOB to perform UB ADL with close supervision OT Short Term Goal 3 (Week 1): Pt  will thread underwear/ brief and pants with min A witout AE  Skilled Therapeutic Interventions/Progress Updates:    1:1. Pt received in bed agreeable to OT initially but stating, "I just feel really off today" and "I just dont feel good" Pt happy she walked this morning but overall with low mood. Pt educated on OH, abdominal binder and compressive devices. Pt encouraged to sit OOB or at lease with HOB elevated and FOB lowered. Pt vitals assessed as stated below with thigh high teds donned.   Laying 123/69 Sitting 108/70 Standing 97/72  Pt declining further tx. Stating "I just feel to bad to do anything." Educated pt on use of adaptive remote with cell phone to allow pt to change channel, but pt declined use.  Exited session with pt seated in bed, exit alarm on and call light in reach  Pt missed 37 min skilled OT d/t orthostatic hypotension and fatigue. Will follow up to make up missed time per POC    Therapy Documentation Precautions:  Precautions Precautions: Fall, Cervical Precaution Booklet Issued: No Precaution Comments: verbal review of neck precautions, will need reinforcement Required Braces or Orthoses: Cervical Brace Cervical Brace: Soft collar Restrictions Weight Bearing Restrictions: No Other Position/Activity Restrictions: 2nd rib fx General:  Agitated Behavior Scale: TBI  Observation Details Observation Environment: CIR Start of observation period - Date: 05/21/22 Start of observation period -  Time: 1305 End of observation period - Date: 05/21/22 End of observation period - Time: 1329 Agitated Behavior Scale (DO NOT LEAVE BLANKS) Short attention span, easy distractibility, inability to concentrate: Absent Impulsive, impatient, low tolerance for pain or frustration: Absent Uncooperative, resistant to care, demanding: Absent Violent and/or threatening violence toward people or property: Absent Explosive and/or unpredictable anger: Absent Rocking, rubbing, moaning, or other self-stimulating behavior: Absent Pulling at tubes, restraints, etc.: Absent Wandering from treatment areas: Absent Restlessness, pacing, excessive movement: Absent Repetitive behaviors, motor, and/or verbal: Absent Rapid, loud, or excessive talking: Absent Sudden changes of mood: Absent Easily initiated or excessive crying and/or laughter: Absent Self-abusiveness, physical and/or verbal: Absent Agitated behavior scale total score: 14  Therapy/Group: Individual Therapy  Tonny Branch 05/21/2022, 1:30 PM

## 2022-05-21 NOTE — Progress Notes (Signed)
Orthopedic Tech Progress Note Patient Details:  Jane Andrews 03-29-1925 [id number] [place] collar was delivered to the patient's room for nurse to apply.  Ortho Devices Type of Ortho Device: [place] cervical collar Ortho Device/Splint Location: Neck Ortho Device/Splint Interventions: Ordered   Post Interventions Patient Tolerated: Well Instructions Provided: Care of device, Adjustment of device  Jane Andrews 05/21/2022, 9:07 AM

## 2022-05-21 NOTE — Progress Notes (Signed)
Physical Therapy TBI Note  Patient Details  Name: Jane Andrews MRN: [id number] Date of Birth: [birth year]  Today's Date: 05/21/2022 PT Individual Time: 0920-1030 and 1640-1735 PT Individual Time Calculation (min): 70 min and 55 min  Short Term Goals: Week 1:  PT Short Term Goal 1 (Week 1): Patient will perform basic transfers with equal to or better than mod A consistently. PT Short Term Goal 2 (Week 1): Patient will ambulate >50 fet using LRAD with mod A. PT Short Term Goal 3 (Week 1): Patient will initiate stair training.  Skilled Therapeutic Interventions/Progress Updates:    Session 1: Pt received asleep, supine in bed and upon awakening agreeable to therapy session though remaining lethargic at beginning of session. Pt wearing soft cervical collar throughout session. Noticed a rigid cervical collar had been delivered to the pt's room - notified the nurse - per MD note this is for showering. Donned thigh high TED hose and assessed vitals in supine: BP 132/71 (MAP 88), HR 92bpm   Supine>sitting R EOB, HOB partially elevated and relying on bedrails, via logroll technique to maintain cervical precautions with light mod assist for bringing trunk upright and then mod assist to scoot hips to EOB via R/L lateral lean technique. While coming to sitting pt experienced onset of sudden "dizziness" that lasted <30seconds - likely related to BPPV diagnosis per MD note.  Pt reports feeling "weak" and "kind of lightheaded" once EOB, vitals reassessed: BP 107/83 (MAP 92), HR 118bpm with pt stating "I just can't wake up honey," but despite this agreeable to continue participating. Reassessed seated vitals at EOB after 3 minutes while encouraging increased fluid intake: BP 118/99 (MAP 103), HR 102bpm   L squat pivot EOB>w/c to avoid standing due to orthostatic hypotension with heavy mod assist for pivoting hips and requiring multiple scoots. Reassessed vitals in w/c: BP 130/98 (MAP 109), HR 98bpm     Transported to/from gym in w/c for time management and energy conservation.  Performed B LE reciprocal movement pattern for LE strengthening and BP management on Kinetron against 70cm/sec resistance for 3 minutes. Pt states "feels good to be able to move".   R stand pivot w/c>EOM using RW with light mod assist for balance and AD management while turning - demos impaired LE coordination while turning and poor AD management not keeping it with her.  Reassessed vitals: BP 126/94 (MAP 105), HR 111bpm    Gait training 26f + 262f+ 4050f [id number] mod assist for balance and AD management as well as +2 for w/c follow initially for safety but not needed. Pt demos L lateral trunk lean caused from L LE scissoring - overall L LE and truncal ataxia with increased postural instability - benefits from therapist controlling AD movements and keeping it stable as well as repeated verbal cuing for LLE hip abduction to widen BOS (pt demos slight ankle inversion rolling but not appearing at large risk for rolling her ankle) - slow gait speed.   Vitals after 1st walk: BP 136/91 (MAP 104), HR 112bpm with pt stating she "feels terrible" perseverating on the fact that her "hands feel numb." Therapist provided encouragement on her progress and emotional support; however, pt responded by saying "I don't want to live like this." Therapist notified pt's primary SLP of her comment and requested Rec Therapy as well as Neuropsych referral.  Of note, when turning to sit in w/c at end of each walk pt requires heavier mod assist to maintain balance and  keep AD close while turning as well as max cuing to step back fully to seat prior to initiating sit as pt starts to sit and push hips backwards towards the seat.  At end of session, pt agreeable to remain sitting up in w/c - left with needs in reach, soft call button in lap, and seat belt alarm on.     Session 2: Pt received resting in the bed and agreeable to therapy session. Pt  wearing soft cervical collar throughout session. Pt continues to perseverate on her bilateral hand numbness stating she feels they are even more numb today than yesterday - therapist provides emotional support and encouragement throughout session for improvement in pt's mood. Pt already wearing B LE thigh high TED hose.   Vitals as follows during session: Supine: BP 125/66 (MAP 84), HR 84bpm  Sitting: BP 116/74 (MAP 88), HR 91bpm  In sitting, but after standing for LB clothing management: BP 130/74 (MAP 93), HR 96bpm  After 1st gait bout: BP 140/80 (MAP 96), HR 97bpm  Did not utilize abdominal binder during session as BP stable and no reports of symptoms.  Supine>sitting R EOB, HOB partially elevated, with light mod assist for trunk upright - while coming into sitting pt again reports feeling sudden onset of "dizziness" that dissipates in < 30 seconds, believe this may be due to pt having BPPV per MD note - light mod assist to scoot hips towards EOB.   Pt requesting to change into her personal pants.   Sit<>stands EOB<>RW with varying min/mod assist for balance and lifting into standing - requires verbal/visual cuing to ensure L LE abducted to avoid NBOS prior to initiating coming to stand - pt with L posterior lean coming to stand. Standing with heavy min/light mod assist for balance while pt managed LB clothing with min assist given increased time. Sitting EOB, requires mod assist to thread pants onto LEs and then able to return to standing and pull them over her hips with min assist.  L stand pivot EOB>w/c using RW with mod assist for balance and AD management - continued max cuing to keep RW close to her while turning and not initiate sitting too soon because she starts to stick her hips backwards and lean back before truly being close enough to the seat - demos B LE ataxia when turning with overshooting/undershooting steps.   Transported to/from gym in w/c for time management and energy  conservation.  Gait training 80f x2 using RW with mod assist for balance and AD management due to L lateral lean associated with L LE scissoring ataxia - thearpist needing to stabilize AD throughout although less so than this AM - continues to require repeated cuing to keep L LE abducted as well as maintain full upright posture.  When turning to sit, continues to require total step-by-step cuing for AD management and LE step sequencing to turn fully prior to initiating sitting - mild improvement at end when cued to take several small steps as pt trying to make too large of a movement.  Transported back to room and pt requesting to sit in recliner. R stand pivot w/c>recliner using UE support on bedrail and armrest with min assist for balance. Pt left seated in recliner with needs in reach, seat belt alarm on, soft call button in her lap, and meal tray set-up.    Therapy Documentation Precautions:  Precautions Precautions: Fall, Cervical Precaution Booklet Issued: No Precaution Comments: verbal review of neck precautions, will need reinforcement  Required Braces or Orthoses: Cervical Brace Cervical Brace: Soft collar Restrictions Weight Bearing Restrictions: No Other Position/Activity Restrictions: 2nd rib fx   Pain:   Session 1: Reports bilateral hip and knee pain which pt states is from "being old," but otherwise no complaints of pain during session.  Session 2: No reports of pain throughout session.  Agitated Behavior Scale: TBI  Session 1: Observation Details Observation Environment: CIR Start of observation period - Date: 05/21/22 Start of observation period - Time: 0920 End of observation period - Date: 05/21/22 End of observation period - Time: 1030 Agitated Behavior Scale (DO NOT LEAVE BLANKS) Short attention span, easy distractibility, inability to concentrate: Absent Impulsive, impatient, low tolerance for pain or frustration: Absent Uncooperative, resistant to care,  demanding: Absent Violent and/or threatening violence toward people or property: Absent Explosive and/or unpredictable anger: Absent Rocking, rubbing, moaning, or other self-stimulating behavior: Absent Pulling at tubes, restraints, etc.: Absent Wandering from treatment areas: Absent Restlessness, pacing, excessive movement: Absent Repetitive behaviors, motor, and/or verbal: Absent Rapid, loud, or excessive talking: Absent Sudden changes of mood: Absent Easily initiated or excessive crying and/or laughter: Absent Self-abusiveness, physical and/or verbal: Absent Agitated behavior scale total score: 14  Session 2: Observation Details Observation Environment: CIR Start of observation period - Date: 05/21/22 Start of observation period - Time: 0920 End of observation period - Date: 05/21/22 End of observation period - Time: 1030 Agitated Behavior Scale (DO NOT LEAVE BLANKS) Short attention span, easy distractibility, inability to concentrate: Absent Impulsive, impatient, low tolerance for pain or frustration: Absent Uncooperative, resistant to care, demanding: Absent Violent and/or threatening violence toward people or property: Absent Explosive and/or unpredictable anger: Absent Rocking, rubbing, moaning, or other self-stimulating behavior: Absent Pulling at tubes, restraints, etc.: Absent Wandering from treatment areas: Absent Restlessness, pacing, excessive movement: Absent Repetitive behaviors, motor, and/or verbal: Absent Rapid, loud, or excessive talking: Absent Sudden changes of mood: Absent Easily initiated or excessive crying and/or laughter: Absent Self-abusiveness, physical and/or verbal: Absent Agitated behavior scale total score: 14     Therapy/Group: Individual Therapy  Tawana Scale , PT, DPT, NCS, CSRS 05/21/2022, 7:56 AM

## 2022-05-22 NOTE — Progress Notes (Signed)
Speech Language Pathology Daily Session Note  Patient Details  Name: Jane Andrews MRN: [id number] Date of Birth: 24-Feb-1925  Today's Date: 05/22/2022 SLP Group Time: 1215-1230 SLP Group Time Calculation (min): 15 min  Short Term Goals: Week 1: SLP Short Term Goal 1 (Week 1): Pt will sustain attention to simple functional task for 10 minutes with Min A. SLP Short Term Goal 2 (Week 1): Pt will recall at least 3 pieces of functional information pertaining to her daily events with use of external and internal aids (WRAP strategies) with Mod A. SLP Short Term Goal 3 (Week 1): Pt will participate in simple problem-solving tasks re: iADL's with 90% accuracy given Mod A. SLP Short Term Goal 4 (Week 1): Pt will complete functional divergent and convergent naming tasks with 90% accuracy given Mod A. SLP Short Term Goal 5 (Week 1): Pt will consume regular diet textures and thin liquids with implementation of safe swallowing strategies with Mod I.  Skilled Therapeutic Interventions: Skilled group treatment with OT in [place] focused on cognitive-linguistic goals and self-feeding. Patient participated in a functional conversation that focus on recall of events from previous therapy sessions. Patient was able to alternate her attention between conversation and self-feeding for ~5 minutes and required Min verbal cues to recall events from previous therapy sessions. Patient was overall Mod I for word-finding. Patient left upright in wheelchair with alarm on and all needs within reach. Continue with current plan of care.       Pain No/Denies Pain   Therapy/Group: Group Therapy  Kimara Bencomo 05/22/2022, 1:30 PM

## 2022-05-22 NOTE — Progress Notes (Signed)
GU Location of Tumor / Histology: Malignant neoplasm of trigone urinary bladder   NM PET Skull Base To Thigh 05/01/2022 CLINICAL DATA:  Initial treatment strategy for bladder cancer  FINDINGS:  Mediastinal blood pool activity: SUV max 2.64   Liver activity: SUV max NA   NECK: No hypermetabolic lymph nodes in the neck.   Incidental CT findings: none   CHEST: No hypermetabolic mediastinal or hilar nodes. No suspicious pulmonary nodules on the CT scan.   Incidental CT findings: Aortic atherosclerosis, no aneurysmal dilation of the thoracic aorta. Central pulmonary vasculature is normal caliber. Normal heart size without pericardial effusion.  Limited assessment of cardiovascular structures given lack of intravenous contrast. Signs of RIGHT mastectomy and breast reconstruction. No adenopathy by size criteria in the chest.   Lungs are clear and airways are patent aside from basilar atelectasis.   ABDOMEN/PELVIS: No signs of solid organ metastasis or nodal disease with increased metabolic activity.   At the bladder base there are areas of potential irregularity extending beyond the bladder base though the pattern of FDG uptake could also be related to bladder diverticulum at the bladder base, this is not clear and the area shows substantial beam hardening artifact from RIGHT hip arthroplasty changes limiting assessment of the bladder base. Discrete lesion of the bladder base is not seen by CT.   Abnormal contour of the bladder base as evidence by FDG uptake best seen on image 210/3. When windowed differently there are more focal areas of increased metabolic activity with a maximum SUV of 22 though difficult to separate at different when the levels from opacified urine both in the LEFT and RIGHT hemi bladder. Area of bulging at the bladder base inseparable from region of reproductive structures and not well-defined.   Incidental CT findings: Post cholecystectomy. No acute findings relative to  pancreas, spleen, adrenal glands or gastrointestinal tract. Large staghorn calculus fills the RIGHT renal collecting systems. No LEFT-sided hydronephrosis or distal RIGHT ureteral dilation. Aortic atherosclerosis without aneurysmal dilation.   SKELETON: No focal hypermetabolic activity to suggest skeletal metastasis.   Incidental CT findings: Spinal degenerative changes and RIGHT hip arthroplasty.   IMPRESSION: Irregular contour the urinary bladder could reflect the patient's bladder mass though could also be related to bladder diverticulum or perivesical extension of bladder tumor. Ultrasound correlation could be considered in addition to MRI for further assessment if this would change management. No signs of metastatic disease.  Multifocal sites of variable FDG uptake at the bladder base may reflect multifocal involvement or artifact related to attenuation correction, concentrated urine with FDG does not allow for assessment and streak artifact on CT for the limits evaluation.  Staghorn calculus of the RIGHT kidney similar to prior imaging.   Aortic atherosclerosis.    US Renal 04/16/2022 CLINICAL DATA:  Bladder mass.  TURBT today. FINDINGS:  Right Kidney: Renal measurements: 10.3 x 5.1 x 4.9 cm = volume: 134 mL. Large staghorn calculus right renal pelvis. Mild hydronephrosis. Right lower pole cyst 10 mm. Right lower pole cyst 15 mm.   Left Kidney: Renal measurements: 8.2 x 4.6 x 4.2 cm = volume: 83 mL. Mild left hydronephrosis. No definite calculi. Left upper pole cyst 3 x 4 cm.  Small left lateral cyst 6 mm.   Bladder:  Foley catheter present in the bladder. Bladder is empty. No mass is seen in the bladder.   Other:   None.   IMPRESSION: Large staghorn calculus right renal pelvis with mild right hydronephrosis.  Small kidney with  mild left hydronephrosis.  Foley catheter in the bladder.  Past/Anticipated interventions by urology, if any: NA  Past/Anticipated interventions by  medical oncology, if any: NA  Weight changes, if any: No  Bowel/Bladder complaints, if any:  UTI and due surgery after removal some intestines causing diarrhea over 25 year ago  Nausea/Vomiting, if any:  No  Pain issues, if any:  No  Weakness/numbness to extremities:, if any:  Yes, numbness tingling to hands and feet due to fall.  SAFETY ISSUES: Prior radiation? No Pacemaker/ICD?  No Possible current pregnancy? Postmenopausal Is the patient on methotrexate? No  Current Complaints / other details:   Inpatient

## 2022-05-22 NOTE — Progress Notes (Signed)
PROGRESS NOTE   Subjective/Complaints: Sleeping better with xanax adjustment. Still anxious about her neurological symptoms and prognosis for recovery. Daughter present and says she went through this before when hospitalized.  ROS: Patient denies fever, rash, sore throat, blurred vision, dizziness, nausea, vomiting, diarrhea, cough, shortness of breath or chest pain, joint or back/neck pain, headache . .    Objective:     No results found. No results for input(s): "WBC", "HGB", "HCT", "PLT" in the last 72 hours.  No results for input(s): "NA", "K", "CL", "CO2", "GLUCOSE", "BUN", "CREATININE", "CALCIUM" in the last 72 hours.   Intake/Output Summary (Last 24 hours) at 05/22/2022 0957 Last data filed at 05/22/2022 4481 Gross per 24 hour  Intake 680 ml  Output --  Net 680 ml        Physical Exam: Vital Signs Blood pressure 133/64, pulse 76, temperature 98 F (36.7 C), resp. rate 18, height '5\' 1"'$  (1.549 m), weight 53.2 kg, SpO2 100 %. Constitutional: No distress . Vital signs reviewed. HEENT: NCAT, EOMI, oral membranes moist Neck: supple Cardiovascular: RRR without murmur. No JVD    Respiratory/Chest: CTA Bilaterally without wheezes or rales. Normal effort    GI/Abdomen: BS +, non-tender, non-distended Ext: no clubbing, cyanosis, or edema Psych: pleasant, seems more up beat and less anxious Skin: occipital wound with scab --sl drainage on pillow, wound looks dry Neuro: Alert and oriented x3. Decreased sensation in bilateral hands and ulnar side of wrist. Proprioceptive deficits in legs too.  4/5 strength in upper extremities, 5/5 strength in lower extremities.  Musculoskeletal: no pain with limb rom.   Assessment/Plan: 1. Functional deficits which require 3+ hours per day of interdisciplinary therapy in a comprehensive inpatient rehab setting. Physiatrist is providing close team supervision and 24 hour management of active  medical problems listed below. Physiatrist and rehab team continue to assess barriers to discharge/monitor patient progress toward functional and medical goals  Care Tool:  Bathing    Body parts bathed by patient: Right arm, Left arm, Chest, Abdomen, Right upper leg, Left upper leg, Face   Body parts bathed by helper: Front perineal area, Buttocks, Right lower leg, Left lower leg     Bathing assist Assist Level: Moderate Assistance - Patient 50 - 74%     Upper Body Dressing/Undressing Upper body dressing   What is the patient wearing?: Button up shirt    Upper body assist Assist Level: Moderate Assistance - Patient 50 - 74%    Lower Body Dressing/Undressing Lower body dressing      What is the patient wearing?: Incontinence brief, Pants     Lower body assist Assist for lower body dressing: Maximal Assistance - Patient 25 - 49%     Toileting Toileting    Toileting assist Assist for toileting: Moderate Assistance - Patient 50 - 74%     Transfers Chair/bed transfer  Transfers assist     Chair/bed transfer assist level: Moderate Assistance - Patient 50 - 74% Chair/bed transfer assistive device: Armrests, Programmer, multimedia   Ambulation assist      Assist level: Moderate Assistance - Patient 50 - 74% Assistive device: Walker-rolling Max distance: 33f   Walk  10 feet activity   Assist  Walk 10 feet activity did not occur: Safety/medical concerns        Walk 50 feet activity   Assist Walk 50 feet with 2 turns activity did not occur: Safety/medical concerns         Walk 150 feet activity   Assist Walk 150 feet activity did not occur: Safety/medical concerns         Walk 10 feet on uneven surface  activity   Assist Walk 10 feet on uneven surfaces activity did not occur: Safety/medical concerns         Wheelchair     Assist Is the patient using a wheelchair?: Yes Type of Wheelchair: Manual    Wheelchair assist  level: Dependent - Patient 0%      Wheelchair 50 feet with 2 turns activity    Assist        Assist Level: Dependent - Patient 0%   Wheelchair 150 feet activity     Assist      Assist Level: Dependent - Patient 0%   Blood pressure 133/64, pulse 76, temperature 98 F (36.7 C), resp. rate 18, height '5\' 1"'$  (1.549 m), weight 53.2 kg, SpO2 100 %.  . Medical Problem List and Plan: 1. Functional deficits secondary to Central cord syndrome as well as TBI (SAH/SDH)             -patient may shower, ordered [place] collar for shower             -ELOS/Goals: 25 days            -Continue CIR therapies including PT, OT, and SLP. Spoke to patient and daughter about prognosis, ambulation, etc.   2.  Antithrombotics: -DVT/anticoagulation:  Pharmaceutical: Lovenox started pm 06/02              -antiplatelet therapy: N/a due to bleed.  3. Pain: continue Tylenol QID, Cymbalta '90mg'$  daily, gabapentin, tramadol 4. Mood/sleep: pt to provide ego support -pt is already on cymbalta and xanax as pta for anxiety disorder   -adjusted xanax to '1mg'$  qhs she uses at home which has helped sleep  - free T4 level ok, acutally a bit high - neuropsych to see Friday -team/family providing positive reinforcement and support             -antipsychotic agents: N/A 5. Neuropsych: This patient is capable of making decisions on her own behalf. 6. Skin/Wound Care: wound generally dry. I think she might be loosening it at times when she rubs on pillow, causing some of drainage 7. Fluids/Electrolytes/Nutrition: encourage PO      8.  Seizure proph: On Keppra bid X 7 days per NS 9. L occipital calvarium Fx: Soft collar at all times with outpatient follow up w/Dr. Annette Stable 10. R- 2nd rib Fx: Encourage IS 11. 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   Advised her that we are following at her sensory  symptoms might wax and wane a bit.  -neuro exam stable 12. CKD stage IV: Followed by Dr. Theador Hawthorne Baseline SCr- --sl bump in BUN.  no changes to regimen - Continue sodium bicarb 13. H/o UGIB/Iron deficiency anemia/HH: Add iron supplement. B12 reviewed and is stable.  14. TURBT 04/16/2022 for invasive high grade bladder CA: h/o dysuria/frequency --was referred for palliative XRT, Dr. Tammi Klippel.  --  televisit Friday 6/9, daughter confirmed   36. 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  17. Vitamin D deficiency: started ergocalciferol 50,000U once per week for 7 weeks 18. BPPV: changed meclizine to tid prn  -vestibular treatment limited d/t cervical issues  LOS: 5 days A FACE TO FACE EVALUATION WAS PERFORMED  Meredith Staggers 05/22/2022, 9:57 AM

## 2022-05-22 NOTE — Evaluation (Signed)
Recreational Therapy Assessment and Plan  Patient Details  Name: Jane Andrews MRN: [id number] Date of Birth: 1925-01-23 Today's Date: 05/22/2022  Rehab Potential:  Good ELOS:   d/c 6/23  Assessment Hospital Problem: Principal Problem:   TBI (traumatic brain injury) [facility])     Past Medical History:      Past Medical History:  Diagnosis Date   Abdominal adhesions 1994   Allergic rhinitis     Anemia     Anxiety and depression     Aortic stenosis     Arthritis     Atrial fibrillation ([place]) 10/2012    Associated with severe anemia and esophageal pill impaction   Breast carcinoma ([place])      Right mastectomy   Cholelithiasis     Essential hypertension     Gastroesophageal reflux disease      Hiatal hernia   History of blood transfusion     Hypothyroidism     Low back pain     Malabsorption      Short gut syndrome following small bowel resection surgery x2   Nephrolithiasis 2004    Painless hematuria   Short gut syndrome      Bowel resection , 2004   Upper GI bleed 2004    Multiple episodes of melena-? due to gastritis or adverse drug effect (nonsteroidals, small bowel ulceration with Fosamax); caused by Pepto-Bismol during one Emergency Department evaluation    Past Surgical History:       Past Surgical History:  Procedure Laterality Date   ABDOMINAL HYSTERECTOMY        emergency s/p delivery   ABDOMINAL HYSTERECTOMY   1960    massive gynecologic bleeding   BOWEL RESECTION        Resulting short gut syndrome   CHOLECYSTECTOMY N/A 06/25/2018    Procedure: LAPAROSCOPIC CHOLECYSTECTOMY WITH INTRAOPERATIVE CHOLANGIOGRAM;  Surgeon: Armandina Gemma, MD;  Location: [facility];  Service: General;  Laterality: N/A;   COLONOSCOPY W/ POLYPECTOMY   2005    Lipoma; diverticulosis   COLONOSCOPY WITH ESOPHAGOGASTRODUODENOSCOPY (EGD)   11/22/2012    [facility]   CYSTOSCOPY W/ RETROGRADES Bilateral 04/16/2022    Procedure: CYSTOSCOPY;  Surgeon: Primus Bravo., MD;  Location: [facility];   Service: Urology;  Laterality: Bilateral;   ESOPHAGOGASTRODUODENOSCOPY (EGD) WITH PROPOFOL N/A 04/04/2022    Procedure: ESOPHAGOGASTRODUODENOSCOPY (EGD) WITH PROPOFOL;  Surgeon: Rogene Houston, MD;  Location: [facility] SUITE;  Service: Endoscopy;  Laterality: N/A;  210   HIP ARTHROPLASTY Right 06/15/2020    Procedure: ANTERIOR ARTHROPLASTY BIPOLAR HIP (HEMIARTHROPLASTY);  Surgeon: Mcarthur Rossetti, MD;  Location: [facility];  Service: Orthopedics;  Laterality: Right;   LAPAROSCOPIC LYSIS OF ADHESIONS   1965    s/p adhesions   MASTECTOMY   right breast   MASTECTOMY        Carcinoma of the breast; right   TRANSURETHRAL RESECTION OF BLADDER TUMOR N/A 04/16/2022    Procedure: TRANSURETHRAL RESECTION OF BLADDER TUMOR (TURBT);  Surgeon: Primus Bravo., MD;  Location: [facility];  Service: Urology;  Laterality: N/A;   UPPER GASTROINTESTINAL ENDOSCOPY          Assessment & Plan Clinical Impression: Patient is a 88 y.o. year old female with history of AF, R- MRM for breast cancer, CKD IV, R- staghorn calculus, TURBT (Dr. Felipa Eth) for bladder CA, abdominal adhesions, short gut syndrome who was admitted via [facility] on 05/13/22 after found on the floor by a neighbor with laceration on the back of her head.  She was found to have right parieto-occipital scalp hematoma, small acute SAH overlying anterolateral  left frontal lobe and small SAH along anteroinferior B-fronal lobe with small hemorrhagic contusions right temporal lobe and acute SDH along left tentorium, cervical stenosis C3/4 with advanced DDD C3-C7, suspected fracture right lateral 2nd rib. Patient reported some HA as well as dizziness. Dr. Annette Stable evaluated patient and recommended conservative management as without neurologic symptoms.    She has had issues with bouts of lethargy reported numbness and tingling in bilateral hands and feet as well as sandpaper feeling in her hands. MRI C- spine ordered for work up on 06/02 to rule out nerve impingement  given cervical stenosis. This revealed mild edema in posterior paraspinous soft tissue with thin hairline fracture involving left occipital calvarium extending towards jugular foramen and degenerative spondylosis C3/4 and C4/5 with moderate to severe spinal stenosis with flattening of cord (no signal changes).  C -collar recommended for support.  Palliative care consulted to discuss [place] and patient elected on DNR. Exam by Dr. Annette Stable revealed some mild weakness in grips/intrinsic function bilaterally with patchy sensory loss in BUE/BLE felt to be due to myelopathy secondary to fall in setting of severe cervical spondylosis and monitoring recommended.  She continues to be limited by pain, ataxic gait with left lean, decreased recall with problems sequencing  as well as impaired sensation in bilateral feet. CIR recommended due to functional decline. She states she is very depressed regarding her current limitations.  Patient transferred to CIR on 05/17/2022 .   Pt presents with decreased activity tolerance, decreased functional mobility, decreased balance, decreased coordination, decreased attention, decreased awareness, feelings of stress and stated feelings of depression Limiting pt's independence with leisure/community pursuits.  Met with pt today to discuss TR services including leisure education, activity analysis/modifications and stress management.  Also discussed the importance of social, emotional, spiritual health in addition to physical health and their effects on overall health and wellness.  Pt stated understanding.  Pt's family and friend present per patient request/approval during this session.   Plan   Min 1 session per week >20 minutes during LOS Recommendations for other services: Neuropsych  Discharge Criteria: Patient will be discharged from TR if patient refuses treatment 3 consecutive times without medical reason.  If treatment goals not met, if there is a change in medical status, if  patient makes no progress towards goals or if patient is discharged from hospital.  The above assessment, treatment plan, treatment alternatives and goals were discussed and mutually agreed upon: by patient  [place] 05/22/2022, 4:13 PM

## 2022-05-22 NOTE — Progress Notes (Signed)
Physical Therapy Session Note  Patient Details  Name: Jane Andrews MRN: [id number] Date of Birth: 01-28-1925  Today's Date: 05/22/2022 PT Individual Time: 7902-4097 and 3532-9924 PT Individual Time Calculation (min): 45 min and 30 min.  Short Term Goals: Week 1:  PT Short Term Goal 1 (Week 1): Patient will perform basic transfers with equal to or better than mod A consistently. PT Short Term Goal 2 (Week 1): Patient will ambulate >50 fet using LRAD with mod A. PT Short Term Goal 3 (Week 1): Patient will initiate stair training.  Skilled Therapeutic Interventions/Progress Updates:   First session:  Pt presents semi-reclined in bed and needing to use BP.  Pt agreeable to therapy.Pt performed bridging enough to remove pants and brief, but unable to clear bed for bedpan placement.  Pt rolled to Left w/ mod A and verbal cues for hooklyiing position.to place bedpan.  Pt rolled to left for removal of bedpan, continent of bladder, nursing notified.  Ne w brief donned during rolling.  PT removed pants to don THT total A w/ pt lifting legs.  PT pulled pants to knees and pt able to pull to hips and perform bridging to attempt pulling pants rest of way, but unable.  Pt rolled side to side for total A to complete pulling up pants.  Pt transferred R sidelying to sit w/ cues for log roll technique and mod A.  Abd binder donned in sitting at EOB.Pt performed squat pivot transfer bed > w/c w/ mod A and verbal cues.  Pt remained sitting in w/c w/ chair alarm on and all needs in reach w/ visitor present.  Pt states "just don't feel right, but not really dizzy".  BP (supine)- 124/75 MAP 90 BP (sitting)- 127/94 MAP 105 BP (after transfer to w/c)- 121/66 MAP 83  Second session:  Pt presents supine in bed and agreeable for therapy.  Pt BP supine 112/69 MAP 82.  Pt required mod A for supine to R sidelying w/ cues for log roll technique.  Mod A required for sidelying to sit transfers.  BP in sitting at 125/64 MA  81.  Pt scooted self to EOB and then performed seated there ex of calf raises, LAQ, hip flexion and isometric add w/ pillow 3 x 10.  Pt performed w/o UE support except for hip flexion required UE on bed.  Pt states " not feeling quite right, but not really dizzy."  BP while performing activity measured at 115/66.  Pt returned to sidleying w/ mod A but c/o momentary dizziness but disippated w/in 10 seconds.  Educated on breathing technique during transfers.  Pt BP taken in R sidelying at 114/62 MAP 78.  Pt wished to remain sidelying.  Pillow between knees and behind back for comfort.  Bed alarm on and all needs in reach.  Nursing notified of BP and pt response.      Therapy Documentation Precautions:  Precautions Precautions: Fall, Cervical Precaution Booklet Issued: No Precaution Comments: verbal review of neck precautions, will need reinforcement Required Braces or Orthoses: Cervical Brace Cervical Brace: Soft collar Restrictions Weight Bearing Restrictions: No Other Position/Activity Restrictions: 2nd rib fx General:   Vital Signs:   Pain:6/10        Therapy/Group: Individual Therapy  Jane Andrews 05/22/2022, 9:59 AM

## 2022-05-22 NOTE — Progress Notes (Signed)
Physical Therapy Session Note  Patient Details  Name: Jane Andrews MRN: [id number] Date of Birth: October 13, 1925  Today's Date: 05/22/2022 PT Individual Time: 1130-1155 PT Individual Time Calculation (min): 25 min   Short Term Goals: Week 1:  PT Short Term Goal 1 (Week 1): Patient will perform basic transfers with equal to or better than mod A consistently. PT Short Term Goal 2 (Week 1): Patient will ambulate >50 fet using LRAD with mod A. PT Short Term Goal 3 (Week 1): Patient will initiate stair training.  Skilled Therapeutic Interventions/Progress Updates:    Pt received seated in bed, agreeable to PT session. No complaints of pain but pt does report some heartburn. Nursing notified and going to check to see if pt can take any medication for heartburn. Supine BP 126/71. Pt agreeable to sit up to EOB, CGA for supine to sit with use of bedrail. Pt reports the room is "spinning" upon initially sitting up but symptoms resolve. Seated BP 113/75 with knee-high TEDs and abdominal binder. Sit to stand and stand pivot transfer to w/c with RW and min A. Standing BP 97/78 although pt remains asymptomatic. Standing alt L/R toe-taps on 1" step with RW and min to mod A for balance due to ataxia of BLE and trunk. Standing alt L/R marches with RW and min to mod A for balance with poor ability to control LE placement. Pt left seated in w/c in dayroom for [employer] handed off to OT and SLP for next session.  Therapy Documentation Precautions:  Precautions Precautions: Fall, Cervical Precaution Booklet Issued: No Precaution Comments: verbal review of neck precautions, will need reinforcement Required Braces or Orthoses: Cervical Brace Cervical Brace: Soft collar Restrictions Weight Bearing Restrictions: No Other Position/Activity Restrictions: 2nd rib fx      Therapy/Group: Individual Therapy   Excell Seltzer, PT, DPT, CSRS 05/22/2022, 3:38 PM

## 2022-05-22 NOTE — Progress Notes (Signed)
Occupational Therapy TBI Note  Patient Details  Name: Jane Andrews MRN: [id number] Date of Birth: 1925-05-22  Today's Date: 05/22/2022 OT Individual Time: 0900-1015 OT Individual Time Calculation (min): 75 min    Short Term Goals: Week 1:  OT Short Term Goal 1 (Week 1): Pt will transfer to [facility] with mod A consistently OT Short Term Goal 2 (Week 1): Pt will be able to tolerate sitting EOB to perform UB ADL with close supervision OT Short Term Goal 3 (Week 1): Pt  will thread underwear/ brief and pants with min A witout AE  Skilled Therapeutic Interventions/Progress Updates:    Pt resting in w/c upon arrival with Niece present. Pt reports she is tired but agreeable to washing up "a little." UB bathing with min A. Pt required max A to doff/don button up pajama top. Pt required assistance to unbutton and button pajama top. Pt transitioned to day room and completed table tasks with focus on problem solving, attention to task, and grip strengthening. Pt able to complete moderately complex colored peg pattern with supervision. Pt self corrected one error without verbal cues. Pt removed and replaced graded clothes pins (yellow, red, and green) on dowel. Pt reported her hands were fatigued at end of task. Pt returned to room and requested to return to bed. Stand pivot transfer to EOB with min A. Sit>supine with mod A. Tot A for repositioning. Pt remained in bed with all needs within reach and bed alarm activated. Niece present.   Therapy Documentation Precautions:  Precautions Precautions: Fall, Cervical Precaution Booklet Issued: No Precaution Comments: verbal review of neck precautions, will need reinforcement Required Braces or Orthoses: Cervical Brace Cervical Brace: Soft collar Restrictions Weight Bearing Restrictions: No Other Position/Activity Restrictions: 2nd rib fx   Pain:  Pt received meds prior to therapy; no report of pain Agitated Behavior Scale: TBI Observation  Details Observation Environment: CIR Start of observation period - Date: 05/22/22 Start of observation period - Time: 0915 End of observation period - Date: 05/22/22 End of observation period - Time: 1015 Agitated Behavior Scale (DO NOT LEAVE BLANKS) Short attention span, easy distractibility, inability to concentrate: Absent Impulsive, impatient, low tolerance for pain or frustration: Absent Uncooperative, resistant to care, demanding: Absent Violent and/or threatening violence toward people or property: Absent Explosive and/or unpredictable anger: Absent Rocking, rubbing, moaning, or other self-stimulating behavior: Absent Pulling at tubes, restraints, etc.: Absent Wandering from treatment areas: Absent Restlessness, pacing, excessive movement: Absent Repetitive behaviors, motor, and/or verbal: Absent Rapid, loud, or excessive talking: Absent Sudden changes of mood: Absent Easily initiated or excessive crying and/or laughter: Absent Self-abusiveness, physical and/or verbal: Absent Agitated behavior scale total score: 14   Therapy/Group: Individual Therapy  Leroy Libman 05/22/2022, 10:18 AM

## 2022-05-22 NOTE — Progress Notes (Signed)
Occupational Therapy Session Note  Patient Details  Name: Jane Andrews MRN: [id number] Date of Birth: Jun 17, 1925  Today's Date: 05/22/2022 OT Group Time: 1200-1215 OT Group Time Calculation (min): 15 min   Short Term Goals: Week 1:  OT Short Term Goal 1 (Week 1): Pt will transfer to [facility] with mod A consistently OT Short Term Goal 2 (Week 1): Pt will be able to tolerate sitting EOB to perform UB ADL with close supervision OT Short Term Goal 3 (Week 1): Pt  will thread underwear/ brief and pants with min A witout AE Week 2:    Week 3:     Skilled Therapeutic Interventions/Progress Updates:     [employer] with SLP with focus on  self feeding  with red built up tubing, tolerance of sitting up and out of bed, endurance, increased PO intake, bilateral Ue tasks of helping to set up tray and d selective attention in a mildly distracting environment,.  Therapy Documentation Precautions:  Precautions Precautions: Fall, Cervical Precaution Booklet Issued: No Precaution Comments: verbal review of neck precautions, will need reinforcement Required Braces or Orthoses: Cervical Brace Cervical Brace: Soft collar Restrictions Weight Bearing Restrictions: No Other Position/Activity Restrictions: 2nd rib fx  Pain:  No indications of pain   Therapy/Group: Group Therapy  Willeen Cass [facility] 05/22/2022, 3:14 PM

## 2022-05-23 ENCOUNTER — Ambulatory Visit
Admission: RE | Admit: 2022-05-23 | Discharge: 2022-05-23 | Disposition: A | Discharge status: Home / Self Care | Payer: Medicare Other | Source: Ambulatory Visit | Attending: Radiation Oncology | Admitting: Radiation Oncology

## 2022-05-23 DIAGNOSIS — C670 Malignant neoplasm of trigone of bladder: Secondary | ICD-10-CM

## 2022-05-23 DIAGNOSIS — C679 Malignant neoplasm of bladder, unspecified: Secondary | ICD-10-CM

## 2022-05-23 MED ORDER — LOPERAMIDE HCL 2 MG PO CAPS
2.0000 mg | ORAL_CAPSULE | ORAL | Status: DC | PRN
Start: 2022-05-23 — End: 2022-06-05
  Administered 2022-06-04 – 2022-06-05 (×2): 2 mg via ORAL
  Filled 2022-05-23 (×2): qty 1

## 2022-05-23 NOTE — Progress Notes (Signed)
Radiation Oncology         [phone] ________________________________  Initial inpatient Telephone Consultation  Name: Jane Andrews MRN: [id number]  Date of Service: 05/23/2022 DOB: November 20, 1925  CC:Redmond [employer], MD  Primus Bravo., *   REFERRING PHYSICIAN: Primus Bravo., *  DIAGNOSIS: 88 y/o female with muscle invasive bladder cancer    ICD-10-CM   1. 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 ([place])  C67.0     2. 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 female seen at the request of Dr. Felipa Eth. She initially presented with persistent dysuria in 12/2021. Urine cultures on 1/7, 1/24, and 4/15 and renal stone CT on 01/26/22 were all benign. She underwent cystoscopy on 04/06/22 showing a sessile-appearing bladder mass in the trigone area. Urine cytology obtained at that time showed dysplastic cells, FISH positive. 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  She underwent staging PET scan on 05/01/22 showing irregular contour of urinary bladder which could reflect the patient's bladder mass though could also be related to a bladder diverticulum or perivesical extension of tumor but no signs of metastatic disease.   Unfortunately, she had a fall at home on 05/13/2022 and sustained a traumatic brain injury with bilateral frontal subarachnoid hemorrhage, left tentorial subdural, right temporal lobe contusion and a right second rib fracture.  Cervical spine MRI showed soft tissue injury with degenerative changes only.  She is currently in the inpatient rehab at [facility], anticipating discharge around 06/06/22.  Prior to this fall, she was living independently at home.  She is not felt to be a surgical candidate so she has been kindly referred today to discuss  potential radiation treatment options for her muscle invasive bladder cancer.  PREVIOUS RADIATION THERAPY: No  PAST MEDICAL HISTORY:  Past Medical History:  Diagnosis Date   Abdominal adhesions 1994   Allergic rhinitis    Anemia    Anxiety and depression    Aortic stenosis    Arthritis    Atrial fibrillation ([place]) 10/2012   Associated with severe anemia and esophageal pill impaction   Breast carcinoma (HCC)    Right mastectomy   Cholelithiasis    Essential hypertension    Gastroesophageal reflux disease    Hiatal hernia   History of blood transfusion    Hypothyroidism    Low back pain    Malabsorption    Short gut syndrome following small bowel resection surgery x2   Nephrolithiasis 2004   Painless hematuria   Short gut syndrome    Bowel resection , 2004   Upper GI bleed 2004   Multiple episodes of melena-? due to gastritis or adverse drug effect (nonsteroidals, small bowel ulceration with Fosamax); caused by Pepto-Bismol during one Emergency Department evaluation      PAST SURGICAL HISTORY: Past Surgical History:  Procedure Laterality Date   ABDOMINAL HYSTERECTOMY     emergency s/p delivery   ABDOMINAL HYSTERECTOMY  1960   massive gynecologic bleeding   BOWEL RESECTION     Resulting short gut syndrome   CHOLECYSTECTOMY N/A 06/25/2018   Procedure: LAPAROSCOPIC CHOLECYSTECTOMY WITH INTRAOPERATIVE CHOLANGIOGRAM;  Surgeon: Armandina Gemma, MD;  Location: [facility];  Service: General;  Laterality: N/A;   COLONOSCOPY W/ POLYPECTOMY  2005   Lipoma; diverticulosis   COLONOSCOPY WITH ESOPHAGOGASTRODUODENOSCOPY (  EGD)  11/22/2012   Rehman   CYSTOSCOPY W/ RETROGRADES Bilateral 04/16/2022   Procedure: CYSTOSCOPY;  Surgeon: Primus Bravo., MD;  Location: [facility];  Service: Urology;  Laterality: Bilateral;   ESOPHAGOGASTRODUODENOSCOPY (EGD) WITH PROPOFOL N/A 04/04/2022   Procedure: ESOPHAGOGASTRODUODENOSCOPY (EGD) WITH PROPOFOL;  Surgeon: Rogene Houston, MD;  Location: [facility];  Service: Endoscopy;  Laterality: N/A;  210   HIP ARTHROPLASTY Right 06/15/2020   Procedure: ANTERIOR ARTHROPLASTY BIPOLAR HIP (HEMIARTHROPLASTY);  Surgeon: Mcarthur Rossetti, MD;  Location: [facility];  Service: Orthopedics;  Laterality: Right;   LAPAROSCOPIC LYSIS OF ADHESIONS  1965   s/p adhesions   MASTECTOMY  right breast   MASTECTOMY     Carcinoma of the breast; right   TRANSURETHRAL RESECTION OF BLADDER TUMOR N/A 04/16/2022   Procedure: TRANSURETHRAL RESECTION OF BLADDER TUMOR (TURBT);  Surgeon: Primus Bravo., MD;  Location: [facility];  Service: Urology;  Laterality: N/A;   UPPER GASTROINTESTINAL ENDOSCOPY      FAMILY HISTORY:  Family History  Problem Relation Age of Onset   Anuerysm Father    Rheum arthritis Sister    Healthy Sister    COPD Sister    Healthy Brother    Cancer Other    Colon cancer Neg Hx     SOCIAL HISTORY:  Social History   Socioeconomic History   Marital status: Widowed    Spouse name: Not on file   Number of children: Not on file   Years of education: 12   Highest education level: Not on file  Occupational History   Not on file  Tobacco Use   Smoking status: Former    Packs/day: 1.50    Years: 20.00    Total pack years: 30.00    Types: Cigarettes   Smokeless tobacco: Never  Vaping Use   Vaping Use: Never used  Substance and Sexual Activity   Alcohol use: No   Drug use: No   Sexual activity: Not Currently    Birth control/protection: Post-menopausal, Surgical    Comment: hyst  Other Topics Concern   Not on file  Social History Narrative   Not on file   Social Determinants of Health   Financial Resource Strain: Not on file  Food Insecurity: Not on file  Transportation Needs: Not on file  Physical Activity: Not on file  Stress: Not on file  Social Connections: Not on file  Intimate Partner Violence: Not on file    ALLERGIES: Codeine  MEDICATIONS:  No current facility-administered medications for this encounter.    No current outpatient medications on file.   Facility-Administered Medications Ordered in Other Encounters  Medication Dose Route Frequency Provider Last Rate Last Admin   acetaminophen (TYLENOL) tablet 325-650 mg  325-650 mg Oral Q4H PRN Bary Leriche, PA-C   650 mg at 05/18/22 0417   acetaminophen (TYLENOL) tablet 650 mg  650 mg Oral TID PC & HS Bary Leriche, PA-C   650 mg at 05/23/22 1019   ALPRAZolam (XANAX) tablet 1 mg  1 mg Oral QHS Alger Simons T, MD   1 mg at 05/22/22 2103   alum & mag hydroxide-simeth (MAALOX/MYLANTA) 200-200-20 MG/5ML suspension 30 mL  30 mL Oral Q4H PRN Bary Leriche, PA-C   30 mL at 05/22/22 1257   bisacodyl (DULCOLAX) suppository 10 mg  10 mg Rectal Daily PRN Love, Pamela S, PA-C       diphenhydrAMINE (BENADRYL) 12.5 MG/5ML elixir 12.5-25 mg  12.5-25 mg Oral Q6H  PRN Bary Leriche, PA-C       DULoxetine (CYMBALTA) DR capsule 90 mg  90 mg Oral Daily Bary Leriche, PA-C   90 mg at 05/23/22 0731   enoxaparin (LOVENOX) injection 30 mg  30 mg Subcutaneous Q24H Love, Pamela S, PA-C   30 mg at 05/23/22 4268   gabapentin (NEURONTIN) capsule 100 mg  100 mg Oral QHS Love, Pamela S, PA-C   100 mg at 05/22/22 2103   guaiFENesin-dextromethorphan (ROBITUSSIN DM) 100-10 MG/5ML syrup 5-10 mL  5-10 mL Oral Q6H PRN Love, Pamela S, PA-C       iron polysaccharides (NIFEREX) capsule 150 mg  150 mg Oral QPC lunch Bary Leriche, PA-C   150 mg at 05/22/22 1257   levothyroxine (SYNTHROID) tablet 75 mcg  75 mcg Oral QAC breakfast Bary Leriche, [place]   75 mcg at 05/23/22 3419   meclizine (ANTIVERT) tablet 12.5 mg  12.5 mg Oral TID PRN Bary Leriche, PA-C       methocarbamol (ROBAXIN) tablet 750 mg  750 mg Oral QID Love, Pamela S, PA-C   750 mg at 05/23/22 6222   metoprolol succinate (TOPROL-XL) 24 hr tablet 25 mg  25 mg Oral Daily Bary Leriche, PA-C   25 mg at 05/23/22 0731   mirabegron ER (MYRBETRIQ) tablet 25 mg  25 mg Oral Daily Bary Leriche, PA-C   25 mg at 05/23/22 0732    polyethylene glycol (MIRALAX / GLYCOLAX) packet 17 g  17 g Oral Daily PRN Love, Pamela S, PA-C       prochlorperazine (COMPAZINE) tablet 5-10 mg  5-10 mg Oral Q6H PRN Love, Pamela S, PA-C       Or   prochlorperazine (COMPAZINE) injection 5-10 mg  5-10 mg Intramuscular Q6H PRN Love, Pamela S, PA-C       Or   prochlorperazine (COMPAZINE) suppository 12.5 mg  12.5 mg Rectal Q6H PRN Love, Pamela S, PA-C       sodium bicarbonate tablet 650 mg  650 mg Oral BID Love, Pamela S, PA-C   650 mg at 05/23/22 0731   sodium phosphate (FLEET) 7-19 GM/118ML enema 1 enema  1 enema Rectal Once PRN Love, Pamela S, PA-C       traMADol (ULTRAM) tablet 50 mg  50 mg Oral Q6H PRN Bary Leriche, PA-C   50 mg at 05/20/22 0533   traZODone (DESYREL) tablet 25 mg  25 mg Oral QHS Meredith Staggers, MD   25 mg at 05/22/22 2102   Vitamin D (Ergocalciferol) (DRISDOL) capsule 50,000 Units  50,000 Units Oral Q7 days Izora Ribas, MD   50,000 Units at 05/18/22 1711    REVIEW OF SYSTEMS:  On review of systems, the patient reports that she is doing well overall. She denies any chest pain, shortness of breath, cough, fevers, chills, night sweats, unintended weight changes. She denies any bladder disturbances, and denies abdominal pain, nausea or vomiting. She notes chronic, intermittent diarrhea s/p bowel resection ~25 years ago. She denies any new musculoskeletal or joint aches or pains. She does report numbness and tingling to her hands and feet secondary to her recent fall. A complete review of systems is obtained and is otherwise negative.    PHYSICAL EXAM:  Wt Readings from Last 3 Encounters:  05/18/22 117 lb 4.6 oz (53.2 kg)  05/13/22 120 lb (54.4 kg)  05/08/22 123 lb 0.3 oz (55.8 kg)   Temp Readings from Last 3 Encounters:  05/23/22  98.2 F (36.8 C)  05/17/22 98 F (36.7 C) (Oral)  04/18/22 98.1 F (36.7 C) (Oral)   BP Readings from Last 3 Encounters:  05/23/22 140/80  05/17/22 133/67  05/08/22 98/60    Pulse Readings from Last 3 Encounters:  05/23/22 80  05/17/22 73  05/08/22 76    /10  Unable to assess due to telephone consult visit format.  KPS = 80  100 - Normal; no complaints; no evidence of disease. 90   - Able to carry on normal activity; minor signs or symptoms of disease. 80   - Normal activity with effort; some signs or symptoms of disease. 71   - Cares for self; unable to carry on normal activity or to do active work. 60   - Requires occasional assistance, but is able to care for most of his personal needs. 50   - Requires considerable assistance and frequent medical care. 59   - Disabled; requires special care and assistance. 70   - Severely disabled; hospital admission is indicated although death not imminent. 14   - Very sick; hospital admission necessary; active supportive treatment necessary. 10   - Moribund; fatal processes progressing rapidly. 0     - Dead  Karnofsky DA, Abelmann [place], Craver LS and Burchenal [facility] [phone]) The use of the nitrogen mustards in the palliative treatment of carcinoma: with particular reference to bronchogenic carcinoma Cancer 1 634-56  LABORATORY DATA:  Lab Results  Component Value Date   WBC 9.8 05/19/2022   HGB 10.8 (L) 05/19/2022   HCT 33.1 (L) 05/19/2022   MCV 89.5 05/19/2022   PLT 190 05/19/2022   Lab Results  Component Value Date   NA 138 05/19/2022   K 3.6 05/19/2022   CL 111 05/19/2022   CO2 20 (L) 05/19/2022   Lab Results  Component Value Date   ALT 13 05/19/2022   AST 18 05/19/2022   ALKPHOS 74 05/19/2022   BILITOT 0.7 05/19/2022     RADIOGRAPHY: DG CHEST PORT 1 VIEW  Result Date: 05/16/2022 CLINICAL DATA:  Rib fractures. EXAM: PORTABLE CHEST 1 VIEW COMPARISON:  05/13/2022 and older exams. FINDINGS: Cardiac silhouette is normal in size and configuration. No mediastinal or hilar masses. Clear lungs.  No convincing pleural effusion and no pneumothorax. No evidence of rib fracture. Specifically, the suspected  fracture of the right lateral second rib on shoulder radiographs from 05/13/2022 is not evident. No bone lesions. Stable changes from right breast surgery. IMPRESSION: 1. No active cardiopulmonary disease. 2. No evidence of a rib fracture. Electronically Signed   By: Lajean Manes M.D.   On: 05/16/2022 10:29   MR CERVICAL SPINE WO CONTRAST  Result Date: 05/16/2022 CLINICAL DATA:  Initial evaluation for spinal stenosis. EXAM: MRI CERVICAL SPINE WITHOUT CONTRAST TECHNIQUE: Multiplanar, multisequence MR imaging of the cervical spine was performed. No intravenous contrast was administered. COMPARISON:  CT from 05/13/2022. FINDINGS: Alignment: Straightening with reversal of the normal cervical lordosis. 2 mm anterolisthesis of C2 on C3, 3 mm retrolisthesis of C3 on C4, with 2 mm retrolisthesis of C4 on C5. Findings likely chronic and degenerative. Vertebrae: Vertebral body height maintained without acute or chronic fracture. Bone marrow signal intensity within normal limits. No discrete or worrisome osseous lesions. No abnormal marrow edema. Cord: Normal signal and morphology. No convincing cord signal changes on this mildly motion degraded exam. Posterior Fossa, vertebral arteries, paraspinal tissues: Chronic microvascular ischemic disease noted within the pons. Visualized brain otherwise unremarkable. Craniocervical junction normal. Mild edema  noted within the posterior paraspinous soft tissues of the neck at the level of C4-5, suspected to reflect mild soft tissue injury related to recent fall. Paraspinous soft tissues demonstrate no other acute finding. Normal flow voids seen within the vertebral arteries bilaterally. Upon review of prior CT, note is made of a thin hairline fracture involving the left occipital calvarium extending towards the jugular foramen (series 3, image 16 on prior head CT). Disc levels: C2-C3: Anterolisthesis. No significant disc bulge. Left-sided facet arthrosis. No significant spinal  stenosis. Foramina remain patent. C3-C4: Retrolisthesis with degenerative intervertebral disc space narrowing. Broad posterior disc osteophyte flattens and effaces the ventral thecal sac. Superimposed facet and ligament flavum hypertrophy. Secondary cord flattening without definite cord signal changes. Severe spinal stenosis, with the thecal sac measuring 6 mm in AP diameter at its most narrow point. Severe bilateral C4 foraminal stenosis. C4-C5: Moderate degenerative intervertebral disc space narrowing with diffuse disc osteophyte complex. Broad posterior component flattens and partially faces the ventral thecal sac. Superimposed facet and ligament flavum hypertrophy. Resultant moderate to severe spinal stenosis. Mild cord flattening without cord signal changes. Moderate to severe left with moderate right C5 foraminal stenosis. C5-C6: Degenerative intervertebral disc space narrowing with diffuse disc osteophyte complex. Broad posterior component flattens and partially effaces the ventral thecal sac, eccentric to the left. Mild cord flattening without cord signal changes. Mild spinal stenosis. Moderate right with mild-to-moderate left C6 foraminal narrowing. C6-C7: Degenerative intervertebral disc space narrowing with diffuse disc bulge. Left greater than right uncovertebral spurring. Bilateral facet hypertrophy. No significant spinal stenosis. Mild left C7 foraminal narrowing. Right neural foramen remains patent. C7-T1: Negative interspace. Right worse than left facet hypertrophy. No spinal stenosis. Mild right C8 foraminal narrowing. Left neural foramen is patent. Visualized upper thoracic spine demonstrates no significant finding. IMPRESSION: 1. Mild edema within the posterior paraspinous soft tissues of the upper cervical spine, likely mild soft tissue injury given the history of recent trauma. 2. No other acute abnormality within the cervical spine. 3. Degenerative spondylosis at C3-4 and C4-5 with resultant  moderate to severe spinal stenosis, most pronounced at C3-4. Secondary cord flattening without cord signal changes. Associated moderate to severe bilateral C4 through C6 foraminal narrowing as above. 4. Upon review of prior CT, note is made of a thin hairline fracture involving the left occipital calvarium extending towards the jugular foramen. Electronically Signed   By: Jeannine Boga M.D.   On: 05/16/2022 03:44   CT Cervical Spine Wo Contrast  Result Date: 05/13/2022 CLINICAL DATA:  Neck trauma. Additional history provided: Patient found on floor at home by neighbor. Laceration to back of head. EXAM: CT CERVICAL SPINE WITHOUT CONTRAST TECHNIQUE: Multidetector CT imaging of the cervical spine was performed without intravenous contrast. Multiplanar CT image reconstructions were also generated. RADIATION DOSE REDUCTION: This exam was performed according to the departmental dose-optimization program which includes automated exposure control, adjustment of the mA and/or kV according to patient size and/or use of iterative reconstruction technique. COMPARISON:  Radiographs of the cervical spine 08/24/2017. Cervical spine CT 08/24/2017. FINDINGS: Alignment: Slight cervical dextrocurvature. 2 mm C2-C3 grade 1 anterolisthesis. 4 mm C3-C4 grade 1 retrolisthesis. Skull base and vertebrae: The basion-dental and atlanto-dental intervals are maintained.No evidence of acute fracture to the cervical spine. Soft tissues and spinal canal: No prevertebral fluid or swelling. No visible canal hematoma. Disc levels: Cervical spondylosis with multilevel disc space narrowing, disc bulges, posterior disc osteophytes, endplate spurring, uncovertebral hypertrophy and facet arthrosis. Disc degeneration is greatest at C3-C4,  C4-C5, C5-C6 and C6-C7. Multilevel spinal canal stenosis. Most notably, there is apparent at least moderate spinal canal stenosis at C3-C4. Multilevel bony neural foraminal narrowing. Upper chest: No  consolidation within the imaged lung apices. No visible pneumothorax. IMPRESSION: No evidence of acute fracture to the cervical spine. 2 mm grade 1 anterolisthesis at C2-C3. 4 mm grade 1 retrolisthesis at C3-C4. Cervical spondylosis, as described. Multilevel spinal canal stenosis. Notably, there appears to be at least moderate spinal canal stenosis at C3-C4. Multilevel bony neural foraminal narrowing. Disc degeneration is advanced at C3-C4, C4-C5, C5-C6 and C6-C7. Electronically Signed   By: Kellie Simmering D.O.   On: 05/13/2022 18:10   CT Head Wo Contrast  Result Date: 05/13/2022 CLINICAL DATA:  Provided history: Head trauma, minor. Additional history provided by scanning technologist: Patient found on floor at home by neighbor. Laceration to back of head. EXAM: CT HEAD WITHOUT CONTRAST TECHNIQUE: Contiguous axial images were obtained from the base of the skull through the vertex without intravenous contrast. RADIATION DOSE REDUCTION: This exam was performed according to the departmental dose-optimization program which includes automated exposure control, adjustment of the mA and/or kV according to patient size and/or use of iterative reconstruction technique. COMPARISON:  Prior head CT examinations 02/10/2022 and earlier. FINDINGS: Brain: Mild generalized cerebral atrophy. Small-volume acute subarachnoid hemorrhage overlying the anterolateral left frontal lobe. Small volume subarachnoid hemorrhage is also present along the anteroinferior frontal lobes, bilaterally. Superimposed hemorrhagic parenchymal contusions at these sites cannot be excluded. Small hemorrhagic parenchymal contusions within the anterior right temporal lobe, measuring up to 9 mm, with overlying small volume acute subarachnoid hemorrhage. Thin acute subdural hemorrhage layering along the left tentorium, measuring up to 3 mm in thickness. Moderate patchy and ill-defined hypoattenuation within the cerebral white matter, nonspecific but compatible  with chronic small vessel ischemic disease. No demarcated cortical infarct. No evidence of an intracranial mass. No midline shift. Vascular: No hyperdense vessel. Atherosclerotic calcifications Skull: No fracture or aggressive osseous lesion. Sinuses/Orbits: No mass or acute finding within the imaged orbits. Small osteoma within a right ethmoid air cell. Minimal mucosal thickening within a left ethmoid air cell. Other: Bilateral mastoid effusions. Right parietooccipital scalp hematoma. These results were called by telephone at the time of interpretation on 05/13/2022 at 6:01 pm to provider Campbell Stall , who verbally acknowledged these results. IMPRESSION: Small-volume acute subarachnoid hemorrhage overlying the anterolateral left frontal lobe. Small-volume acute subarachnoid hemorrhage also present along the anteroinferior frontal lobes, bilaterally. Superimposed hemorrhagic parenchymal contusions at these sites cannot be excluded. Small hemorrhagic parenchymal contusions within the anterior right temporal lobe (measuring up to 9 mm) with overlying small-volume acute subarachnoid hemorrhage. Thin acute subdural hemorrhage along the left tentorium, measuring up to 3 mm in thickness. Right parietooccipital scalp hematoma. Moderate chronic small vessel ischemic changes within the cerebral white matter. Mild generalized cerebral atrophy. Bilateral mastoid effusions. Electronically Signed   By: Kellie Simmering D.O.   On: 05/13/2022 18:02   CT Thoracic Spine Wo Contrast  Result Date: 05/13/2022 CLINICAL DATA:  Found down by neighbor. EXAM: CT THORACIC SPINE WITHOUT CONTRAST TECHNIQUE: Multidetector CT images of the thoracic were obtained using the standard protocol without intravenous contrast. RADIATION DOSE REDUCTION: This exam was performed according to the departmental dose-optimization program which includes automated exposure control, adjustment of the mA and/or kV according to patient size and/or use of iterative  reconstruction technique. COMPARISON:  PET-CT dated May 01, 2022. Chest x-ray dated February 08, 2022. CT chest dated August 24, 2017. FINDINGS:  Alignment: No traumatic malalignment. Vertebrae: No acute fracture or focal pathologic process. Paraspinal and other soft tissues: Old, partially healed nondisplaced fracture of the posterior left seventh rib. Incompletely visualized right renal staghorn calculus. Disc levels: Mild degenerative disc disease of the mid to lower thoracic spine. No evident high-grade spinal canal or neuroforaminal stenosis. IMPRESSION: 1. No acute osseous abnormality. Electronically Signed   By: Titus Dubin M.D.   On: 05/13/2022 18:02   DG Shoulder Right  Result Date: 05/13/2022 CLINICAL DATA:  Right arm pain after fall EXAM: RIGHT SHOULDER - 2+ VIEW COMPARISON:  06/14/2020 FINDINGS: Suspected calcific rotator cuff tendinopathy with superior subluxation of the humeral head with respect to the glenoid, but without dislocation. Suspected fracture of the right second rib laterally. IMPRESSION: 1. Suspected fracture of the right second rib laterally. 2. Mildly superiorly subluxed humeral head, which can sometimes be seen in setting of a chronic rotator cuff tear. There is a suggestion of calcific supraspinatus tendinopathy. No overt dislocation. Electronically Signed   By: Van Clines M.D.   On: 05/13/2022 17:37   DG Humerus Right  Result Date: 05/13/2022 CLINICAL DATA:  Trauma, pain EXAM: RIGHT HUMERUS - 2+ VIEW COMPARISON:  None Available. FINDINGS: No recent fracture or dislocation is seen. There is decreased distance between humeral head and acromion. There is widening of right AC joint space measuring 7 mm. There is no increase in coracoclavicular distance. Surgical clips are seen in the right axilla. IMPRESSION: No recent fracture or dislocation is seen in the right humerus. Decreased distance between humeral head and acromion may suggest chronic tear of rotator cuff.  There is some widening of right AC joint space, possibly suggesting a normal variation or right AC separation. Electronically Signed   By: Elmer Picker M.D.   On: 05/13/2022 17:34   DG Chest 1 View  Result Date: 05/13/2022 CLINICAL DATA:  Fall EXAM: CHEST  1 VIEW COMPARISON:  02/10/2022 FINDINGS: Lungs are clear.  No pleural effusion or pneumothorax. The heart is normal in size. Surgical clips along the right chest wall/axilla. IMPRESSION: No evidence of acute cardiopulmonary disease. Electronically Signed   By: Julian Hy M.D.   On: 05/13/2022 17:34   DG Pelvis 1-2 Views  Result Date: 05/13/2022 CLINICAL DATA:  Fall EXAM: PELVIS - 1-2 VIEW COMPARISON:  None Available. FINDINGS: Right hip arthroplasty, without evidence of complication. Left hip joint space is preserved. Visualized bony pelvis appears intact. Degenerative changes of the lower lumbar spine. IMPRESSION: Negative. Electronically Signed   By: Julian Hy M.D.   On: 05/13/2022 17:33   NM PET Image Initial (PI) Skull Base To Thigh (F-18 FDG)  Result Date: 05/02/2022 CLINICAL DATA:  Initial treatment strategy for bladder cancer. EXAM: NUCLEAR MEDICINE PET SKULL BASE TO THIGH TECHNIQUE: 6.46 mCi F-18 FDG was injected intravenously. Full-ring PET imaging was performed from the skull base to thigh after the radiotracer. CT data was obtained and used for attenuation correction and anatomic localization. Fasting blood glucose: 116 mg/dl COMPARISON:  Renal stone protocol CT of February 2023. FINDINGS: Mediastinal blood pool activity: SUV max 2.64 Liver activity: SUV max NA NECK: No hypermetabolic lymph nodes in the neck. Incidental CT findings: none CHEST: No hypermetabolic mediastinal or hilar nodes. No suspicious pulmonary nodules on the CT scan. Incidental CT findings: Aortic atherosclerosis, no aneurysmal dilation of the thoracic aorta. Central pulmonary vasculature is normal caliber. Normal heart size without pericardial  effusion. Limited assessment of cardiovascular structures given lack of intravenous contrast. Signs of  RIGHT mastectomy and breast reconstruction. No adenopathy by size criteria in the chest. Lungs are clear and airways are patent aside from basilar atelectasis. ABDOMEN/PELVIS: No signs of solid organ metastasis or nodal disease with increased metabolic activity. At the bladder base there are areas of potential irregularity extending beyond the bladder base though the pattern of FDG uptake could also be related to bladder diverticulum at the bladder base, this is not clear and the area shows substantial beam hardening artifact from RIGHT hip arthroplasty changes limiting assessment of the bladder base. Discrete lesion of the bladder base is not seen by CT. Abnormal contour of the bladder base as evidence by FDG uptake best seen on image 210/3. When windowed differently there are more focal areas of increased metabolic activity with a maximum SUV of 22 though difficult to separate at different when the levels from opacified urine both in the LEFT and RIGHT hemi bladder. Area of bulging at the bladder base inseparable from region of reproductive structures and not well-defined. Incidental CT findings: Post cholecystectomy. No acute findings relative to pancreas, spleen, adrenal glands or gastrointestinal tract. Large staghorn calculus fills the RIGHT renal collecting systems. No LEFT-sided hydronephrosis or distal RIGHT ureteral dilation. Aortic atherosclerosis without aneurysmal dilation. SKELETON: No focal hypermetabolic activity to suggest skeletal metastasis. Incidental CT findings: Spinal degenerative changes and RIGHT hip arthroplasty. IMPRESSION: Irregular contour the urinary bladder could reflect the patient's bladder mass though could also be related to bladder diverticulum or perivesical extension of bladder tumor. Ultrasound correlation could be considered in addition to MRI for further assessment if this  would change management. No signs of metastatic disease. Multifocal sites of variable FDG uptake at the bladder base may reflect multifocal involvement or artifact related to attenuation correction, concentrated urine with FDG does not allow for assessment and streak artifact on CT for the limits evaluation. Staghorn calculus of the RIGHT kidney similar to prior imaging. Aortic atherosclerosis. Electronically Signed   By: Zetta Bills M.D.   On: 05/02/2022 17:04      IMPRESSION/PLAN: 1. 88 y.o. 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  Today, we talked to the patient and her niece, [name], about the findings and workup thus far. We discussed the natural history of urothelial carcinoma and general treatment, highlighting the role of radiotherapy in a bladder sparing approach to management of her disease. We discussed the available radiation techniques, and focused on the details and logistics of delivery. We reviewed the anticipated acute and late sequelae associated with radiation in this setting. The patient was encouraged to ask questions that were answered to her stated satisfaction.  At the end of our conversation, the patient is interested in proceeding with the recommended course of daily bladder radiotherapy.  We would anticipate a 2-week course of treatment initially followed by a 3 to 4-week break.  Pending she tolerates this treatment well, we could potentially proceed with an additional 2 to 3-week course of treatment to complete a definitive course of radiotherapy.  They would like to proceed with starting treatment while she is still here in rehab at [facility] so we will coordinate with her inpatient rehab team to set up Elaine early next week, in anticipation of beginning her daily treatments in the near future.  She appears to have a good understanding of her disease and our treatment recommendations which  are of curative intent and is comfortable and in agreement with the stated plan.  We enjoyed meeting her and her niece today and look forward to continuing to participate in her care.   We personally spent 60 minutes in this encounter including chart review, reviewing radiological studies, meeting face-to-face with the patient, entering orders and completing documentation.  This visit was conducted via telephone to spare the patient unnecessary potential exposure in the healthcare setting during the current COVID-19 pandemic.   The attendants for this meeting include Tyler Pita MD, Cadie Sorci PA-C, Katie- scribe, patient, Dot and family members daughter [name]. During the encounter, Tyler Pita MD, Takisha Pelle PA-C, and scribe were located at [facility] Department.  Patient,  Dot and family members daughter [name].were located at [facility].    Nicholos Johns, PA-C    Tyler Pita, MD  [facility] Direct Dial: [phone]  Fax: [phone] [email or web address]  Skype  [email or web address]   This document serves as a record of services personally performed by Tyler Pita, MD and Freeman Caldron, PA-C. It was created on their behalf by Wilburn Mylar, a trained medical scribe. The creation of this record is based on the scribe's personal observations and the provider's statements to them. This document has been checked and approved by the attending provider.

## 2022-05-23 NOTE — Progress Notes (Signed)
Physical Therapy Session Note  Patient Details  Name: Jane Andrews MRN: [id number] Date of Birth: [birth year]  Today's Date: 05/23/2022 PT Individual Time: 1300-1355 PT Individual Time Calculation (min): 55 min   Short Term Goals: Week 1:  PT Short Term Goal 1 (Week 1): Patient will perform basic transfers with equal to or better than mod A consistently. PT Short Term Goal 2 (Week 1): Patient will ambulate >50 fet using LRAD with mod A. PT Short Term Goal 3 (Week 1): Patient will initiate stair training. Week 2:    Week 3:    Week 4:     Skilled Therapeutic Interventions/Progress Updates:   Pt received supine in bed and agreeable to PT. Supine>sit transfer with *** assist and ***cues   Lower body dressing.   Transfers.   Gait training.   Orthostatic VS performed throughout session  Supine: 111/65,  Sitting. 114/69.  Standing 0 min 88/55 Standing 1 min 93/70.  Sitting 126/72.  Sitting following gait training. 105/64.  Marland Kitchen   Pt returned to room and performed ** transfer to bed with **. Sit>supine completed with ** and left supine in bed with call bell in reach and all needs met.  \    Therapy Documentation Precautions:  Precautions Precautions: Fall, Cervical Precaution Booklet Issued: No Precaution Comments: verbal review of neck precautions, will need reinforcement Required Braces or Orthoses: Cervical Brace Cervical Brace: Soft collar Restrictions Weight Bearing Restrictions: No Other Position/Activity Restrictions: 2nd rib fx General:   Vital Signs: Therapy Vitals Temp: (!) 97.4 F (36.3 C) Pulse Rate: 87 Resp: 20 BP: 111/65 Patient Position (if appropriate): Lying Oxygen Therapy SpO2: 97 % O2 Device: Room Air Pain:   Mobility:   Locomotion :    Trunk/Postural Assessment :    Balance:   Exercises:   Other Treatments:      Therapy/Group: Individual Therapy  Lorie Phenix 05/23/2022, 2:27 PM

## 2022-05-23 NOTE — Progress Notes (Signed)
Occupational Therapy TBI Note  Patient Details  Name: Jane Andrews MRN: [id number] Date of Birth: 02-Jun-1925  Today's Date: 05/23/2022 OT Individual Time: 6644-0347 OT Individual Time Calculation (min): 30 min    Short Term Goals: Week 1:  OT Short Term Goal 1 (Week 1): Pt will transfer to [facility] with mod A consistently OT Short Term Goal 2 (Week 1): Pt will be able to tolerate sitting EOB to perform UB ADL with close supervision OT Short Term Goal 3 (Week 1): Pt  will thread underwear/ brief and pants with min A witout AE  Skilled Therapeutic Interventions/Progress Updates:    Pt received semi-reclined in bed, soft collar donned , family present, agreeable to therapy. Session focus on self-care retraining, activity tolerance, transfer retraining in prep for improved ADL/IADL/func mobility performance + decreased caregiver burden.  Total A bed level to don B thigh high teds and pants, pt able to bridge sufficiently to assist as therapist pulled up pants around hips. Came to sitting EOB with mod A to progress BLE off bed and to lift trunk. Close S for static sitting balance, total A to don abdominal binder.       Orthostatic Lying   BP- Lying 139/71 (89)  Pulse- Lying   Orthostatic Sitting  BP- Sitting 118/70 (85)  Pulse- Sitting   Orthostatic Standing at 0 minutes  BP- Standing at 0 minutes 127/66 (84)   Sit to stand and stand-pivot with min A for RW management > w/c. Pt reports urgent need for BM. Stedy transfer with min A > toilet, total A for clothing management and posterior pericare. Direct hand off to SLP as pt needed more time to continue to void.  Therapy Documentation Precautions:  Precautions Precautions: Fall, Cervical Precaution Booklet Issued: No Precaution Comments: verbal review of neck precautions, will need reinforcement Required Braces or Orthoses: Cervical Brace Cervical Brace: Soft collar Restrictions Weight Bearing Restrictions: No Other  Position/Activity Restrictions: 2nd rib fx  Pain:   No c/o Agitated Behavior Scale: TBI Observation Details Observation Environment: CIR Start of observation period - Date: 05/23/22 Start of observation period - Time: 0750 End of observation period - Date: 05/23/22 End of observation period - Time: 0820 Agitated Behavior Scale (DO NOT LEAVE BLANKS) Short attention span, easy distractibility, inability to concentrate: Absent Impulsive, impatient, low tolerance for pain or frustration: Absent Uncooperative, resistant to care, demanding: Absent Violent and/or threatening violence toward people or property: Absent Explosive and/or unpredictable anger: Absent Rocking, rubbing, moaning, or other self-stimulating behavior: Absent Pulling at tubes, restraints, etc.: Absent Wandering from treatment areas: Absent Restlessness, pacing, excessive movement: Absent Repetitive behaviors, motor, and/or verbal: Absent Rapid, loud, or excessive talking: Absent Sudden changes of mood: Absent Easily initiated or excessive crying and/or laughter: Absent Self-abusiveness, physical and/or verbal: Absent Agitated behavior scale total score: 14  ADL: See Care Tool for more details.  Therapy/Group: Individual Therapy  Volanda Napoleon MS, OTR/L  05/23/2022, 12:18 PM

## 2022-05-23 NOTE — Progress Notes (Signed)
Occupational Therapy TBI Note  Patient Details  Name: Jane Andrews MRN: [id number] Date of Birth: February 28, 1925  Today's Date: 05/23/2022 OT Individual Time: 2355-7322 OT Individual Time Calculation (min): 66 min    Short Term Goals: Week 1:  OT Short Term Goal 1 (Week 1): Pt will transfer to [facility] with mod A consistently OT Short Term Goal 2 (Week 1): Pt will be able to tolerate sitting EOB to perform UB ADL with close supervision OT Short Term Goal 3 (Week 1): Pt  will thread underwear/ brief and pants with min A witout AE  Skilled Therapeutic Interventions/Progress Updates:    Pt received supine with no c/o pain, agreeable to OT session. Pt transferred to EOB with min A to shift L hip forward. BP EOB 93/81. Stand pivot transfer to the w/c with min A, premature termination of transfer with edu provided on safety. Stand pivot transfer to the TTB, BP 116/78. Pt required max A to doff LB clothing sit <> stand.  BP following removal of ted hose 108/74. Pt reporting feelings of depression re current situation. Changed out cervical collar to [place] collar (water proof). Pt completed UB bathing with (S). Max A to wash hair (pt's baseline). Mod A to stand and to control forward weight shift (feet sliding out) to stand for posterior peri hygiene. Mod A overall to wash LB. She transferred back to the w/c with mod A following shower. No OH s/s. She completed oral care at the sink with set up assist. No built up handle required. She required assist for donning shirt- max A. Mod A to don pants, ted hose and socks. Max A for hair care.  Pt was left sitting up with all needs met.   Therapy Documentation Precautions:  Precautions Precautions: Fall, Cervical Precaution Booklet Issued: No Precaution Comments: verbal review of neck precautions, will need reinforcement Required Braces or Orthoses: Cervical Brace Cervical Brace: Soft collar Restrictions Weight Bearing Restrictions: No Other  Position/Activity Restrictions: 2nd rib fx  Agitated Behavior Scale: TBI Observation Details Observation Environment: pt room Start of observation period - Date: 05/23/22 Start of observation period - Time: 1400 End of observation period - Date: 05/23/22 End of observation period - Time: 1410 Agitated Behavior Scale (DO NOT LEAVE BLANKS) Short attention span, easy distractibility, inability to concentrate: Absent Impulsive, impatient, low tolerance for pain or frustration: Absent Uncooperative, resistant to care, demanding: Absent Violent and/or threatening violence toward people or property: Absent Explosive and/or unpredictable anger: Absent Rocking, rubbing, moaning, or other self-stimulating behavior: Absent Pulling at tubes, restraints, etc.: Absent Wandering from treatment areas: Absent Restlessness, pacing, excessive movement: Absent Repetitive behaviors, motor, and/or verbal: Absent Rapid, loud, or excessive talking: Absent Sudden changes of mood: Absent Easily initiated or excessive crying and/or laughter: Absent Self-abusiveness, physical and/or verbal: Absent Agitated behavior scale total score: 14  Therapy/Group: Individual Therapy  Curtis Sites 05/23/2022, 3:17 PM

## 2022-05-23 NOTE — Progress Notes (Signed)
Speech Language Pathology Daily Session Note  Patient Details  Name: Jane Andrews MRN: [id number] Date of Birth: 12-Jun-1925  Today's Date: 05/23/2022 SLP Individual Time: 0820-0915 SLP Individual Time Calculation (min): 55 min  Short Term Goals: Week 1: SLP Short Term Goal 1 (Week 1): Pt will sustain attention to simple functional task for 10 minutes with Min A. SLP Short Term Goal 2 (Week 1): Pt will recall at least 3 pieces of functional information pertaining to her daily events with use of external and internal aids (WRAP strategies) with Mod A. SLP Short Term Goal 3 (Week 1): Pt will participate in simple problem-solving tasks re: iADL's with 90% accuracy given Mod A. SLP Short Term Goal 4 (Week 1): Pt will complete functional divergent and convergent naming tasks with 90% accuracy given Mod A. SLP Short Term Goal 5 (Week 1): Pt will consume regular diet textures and thin liquids with implementation of safe swallowing strategies with Mod I.  Skilled Therapeutic Interventions: Skilled treatment session focused on cognitive goals. Upon arrival, patient on commode due to multiple loose stools. Patient would feel as if she was done and would attempt to stand via the [facility] for peri care but then would have loose stools again, this happened X 3. Patient was eventually transferred to the wheelchair but then reported she had to use the bathroom again. Patient was incontinent of bowel which required patient to change her clothes. Patient required Mod verbal cues for problem solving throughout tasks. Patient began to report feeling weak with decreased verbal output noted. Patient transferred back to bed to complete peri care and dressing change. BP assessed: 106/80 with HR: 127. Nursing made aware. Once patient was repositioned, patient reported improvement in overall fatigue and became brighter. Niece present throughout session and assisted intermittently. Patient left supine in bed with alarm on  and all needs within reach. Continue with current plan of care.      Pain No/Denies Pain   Therapy/Group: Individual Therapy  Azriella Mattia 05/23/2022, 3:31 PM

## 2022-05-23 NOTE — Progress Notes (Addendum)
Patient ID: Jane Andrews, female   DOB: Dec 22, 1924, 88 y.o.   MRN: [id number]  SW met with pt niece [name] in passing to discuss meeting with oncology earlier this morning. Reports it went well, and the physicians will speak to discuss best plan of care since they would like to begin treatment while pt is here.   SW received updates from Dr. Tammi Klippel and Dr. Naaman Plummer pt will begin treatment. Pt upcoming appointments are as follows:  6/12-  appt at 2:30pm 6/13-6/20 appt at 4pm 6/21- appt at 4:30pm 6/22- appt at 3:45pm  1334-SW scheduled transportation with [facility] [phone]) for the following pick up times to [facility]:  6/12- p/u at 2pm 6/13-6/16 p/u at 3:30pm  *Transportation for following week will be scheduled next week Friday.  53- SW spoke with pt niece [name] to provide updates. Appreciative of aunt being able to start treatment while she is here. SW shared will f/u on Tuesday after team conference with updates.   1500-SW met with pt in room to discuss above while pt ending OT session. Pt is nervous about procedure since she does not know what to expect. Pt encouraged to remain positive, and reminded that we are here to support and she can decide on what she feels is best.   Loralee Pacas, MSW, [place] Office: [phone] Cell: [phone] Fax: [phone]

## 2022-05-23 NOTE — Progress Notes (Addendum)
PROGRESS NOTE   Subjective/Complaints: Having reasonable nights.   ROS: Patient denies fever, rash, sore throat, blurred vision, dizziness, nausea, vomiting, diarrhea, cough, shortness of breath or chest pain,   back/neck pain, headache, or mood change.      Objective:     No results found. No results for input(s): "WBC", "HGB", "HCT", "PLT" in the last 72 hours.  No results for input(s): "NA", "K", "CL", "CO2", "GLUCOSE", "BUN", "CREATININE", "CALCIUM" in the last 72 hours.   Intake/Output Summary (Last 24 hours) at 05/23/2022 1013 Last data filed at 05/22/2022 1839 Gross per 24 hour  Intake 480 ml  Output --  Net 480 ml        Physical Exam: Vital Signs Blood pressure 140/80, pulse 80, temperature 98.2 F (36.8 C), resp. rate 16, height '5\' 1"'$  (1.549 m), weight 53.2 kg, SpO2 100 %. Constitutional: No distress . Vital signs reviewed. HEENT: NCAT, EOMI, oral membranes moist Neck: supple Cardiovascular: RRR without murmur. No JVD    Respiratory/Chest: CTA Bilaterally without wheezes or rales. Normal effort    GI/Abdomen: BS +, non-tender, non-distended Ext: no clubbing, cyanosis, or edema Psych: pleasant and cooperative, a little anxious  Skin: occipital wound clean/dry Neuro: Alert and oriented x3. Decreased sensation in bilateral hands and ulnar side of wrist. Proprioceptive deficits in legs too.  4/5 strength in upper extremities, 5/5 strength in lower extremities.  Musculoskeletal: no pain with limb rom.   Assessment/Plan: 1. Functional deficits which require 3+ hours per day of interdisciplinary therapy in a comprehensive inpatient rehab setting. Physiatrist is providing close team supervision and 24 hour management of active medical problems listed below. Physiatrist and rehab team continue to assess barriers to discharge/monitor patient progress toward functional and medical goals  Care Tool:  Bathing     Body parts bathed by patient: Right arm, Left arm, Chest, Abdomen, Right upper leg, Left upper leg, Face   Body parts bathed by helper: Front perineal area, Buttocks, Right lower leg, Left lower leg     Bathing assist Assist Level: Moderate Assistance - Patient 50 - 74%     Upper Body Dressing/Undressing Upper body dressing   What is the patient wearing?: Button up shirt    Upper body assist Assist Level: Moderate Assistance - Patient 50 - 74%    Lower Body Dressing/Undressing Lower body dressing      What is the patient wearing?: Incontinence brief, Pants     Lower body assist Assist for lower body dressing: Maximal Assistance - Patient 25 - 49%     Toileting Toileting    Toileting assist Assist for toileting: Moderate Assistance - Patient 50 - 74%     Transfers Chair/bed transfer  Transfers assist     Chair/bed transfer assist level: Moderate Assistance - Patient 50 - 74% Chair/bed transfer assistive device: Armrests, Programmer, multimedia   Ambulation assist      Assist level: Moderate Assistance - Patient 50 - 74% Assistive device: Walker-rolling Max distance: 102f   Walk 10 feet activity   Assist  Walk 10 feet activity did not occur: Safety/medical concerns        Walk 50 feet activity  Assist Walk 50 feet with 2 turns activity did not occur: Safety/medical concerns         Walk 150 feet activity   Assist Walk 150 feet activity did not occur: Safety/medical concerns         Walk 10 feet on uneven surface  activity   Assist Walk 10 feet on uneven surfaces activity did not occur: Safety/medical concerns         Wheelchair     Assist Is the patient using a wheelchair?: Yes Type of Wheelchair: Manual    Wheelchair assist level: Dependent - Patient 0%      Wheelchair 50 feet with 2 turns activity    Assist        Assist Level: Dependent - Patient 0%   Wheelchair 150 feet activity      Assist      Assist Level: Dependent - Patient 0%   Blood pressure 140/80, pulse 80, temperature 98.2 F (36.8 C), resp. rate 16, height '5\' 1"'$  (1.549 m), weight 53.2 kg, SpO2 100 %.  . Medical Problem List and Plan: 1. Functional deficits secondary to Central cord syndrome as well as TBI (SAH/SDH)             -patient may shower, ordered [place] collar for shower             -ELOS/Goals: 25 days            --Continue CIR therapies including PT, OT, and SLP  2.  Antithrombotics: -DVT/anticoagulation:  Pharmaceutical: Lovenox started pm 06/02              -antiplatelet therapy: N/a due to bleed.  3. Pain: continue Tylenol QID, Cymbalta '90mg'$  daily, gabapentin, tramadol 4. Mood/sleep: pt to provide ego support -pt is already on cymbalta and xanax as pta for anxiety disorder   -adjusted xanax to '1mg'$  qhs she uses at home which has helped sleep  - free T4 level ok, acutally a bit high - neuropsych to see today -team/family providing positive reinforcement and support             -antipsychotic agents: N/A 5. Neuropsych: This patient is capable of making decisions on her own behalf. 6. Skin/Wound Care: wound generally dry. I think she might be loosening it at times when she rubs on pillow, causing some of drainage 7. Fluids/Electrolytes/Nutrition: encourage PO     -recheck bmet in am 6/10 8.  Seizure proph: keppra completed 9. L occipital calvarium Fx: Soft collar at all times with outpatient follow up w/Dr. Annette Stable 10. R- 2nd rib Fx: Encouraging IS 11. 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  -still having sensory sx in hands, LE's to lesser extent 12. CKD stage IV: Followed by Dr. Theador Hawthorne Baseline SCr- --sl bump in BUN.  no changes to regimen - Continue sodium bicarb 13. H/o UGIB/Iron deficiency anemia/HH: Add iron supplement. B12 reviewed and is stable.  14. TURBT 04/16/2022 for invasive high grade bladder CA: h/o  dysuria/frequency --was referred for palliative XRT, Dr. Tammi Klippel.  --  televisit Friday today   16. 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  17. Vitamin D deficiency: started ergocalciferol 50,000U once per week for 7 weeks 18. BPPV: changed meclizine to tid prn  -vestibular treatment limited d/t cervical issues  LOS: 6 days A FACE TO FACE EVALUATION WAS PERFORMED  Meredith Staggers 05/23/2022, 10:13 AM

## 2022-05-23 NOTE — Progress Notes (Signed)
Physical Therapy Session Note  Patient Details  Name: Jane Andrews MRN: [id number] Date of Birth: September 10, 1925  Today's Date: 05/23/2022 PT Individual Time: 4481-8563 PT Individual Time Calculation (min): 27 min   Short Term Goals: Week 1:  PT Short Term Goal 1 (Week 1): Patient will perform basic transfers with equal to or better than mod A consistently. PT Short Term Goal 2 (Week 1): Patient will ambulate >50 fet using LRAD with mod A. PT Short Term Goal 3 (Week 1): Patient will initiate stair training.  Skilled Therapeutic Interventions/Progress Updates:     Pt received seated in [facility] and agrees to therapy. No complaint of pain. WC transport to gym for time management. Pt performs stand step transfer to mat with RW and modA, with cues for safe RW management and positioning. Following seated reset break, pt ambulates x50' with RW and primarily minA, but with instances of modA, especially during 180 degree turn due to difficulty sequencing steps. PT provides multimodal cues for posture, maintaining stride width, and ensuring that both legs touch mat prior to sitting. Following extended seated rest break, pt ambulates additional 5' with minA and consistent slight L lateral lean, but no complete LOBs. Pt again sits suddenly and quickly and requires education on safety with transition to [place]. WC transport back to room. Stand step transfer to bed with minA. Sit to supine with modA. Left with alarm intact and all needs within reach.  Therapy Documentation Precautions:  Precautions Precautions: Fall, Cervical Precaution Booklet Issued: No Precaution Comments: verbal review of neck precautions, will need reinforcement Required Braces or Orthoses: Cervical Brace Cervical Brace: Soft collar Restrictions Weight Bearing Restrictions: No Other Position/Activity Restrictions: 2nd rib fx   Therapy/Group: Individual Therapy  Breck Coons, PT, DPT 05/23/2022, 6:10 PM

## 2022-05-24 LAB — BASIC METABOLIC PANEL
Anion gap: 11 (ref 5–15)
BUN: 40 mg/dL — ABNORMAL HIGH (ref 8–23)
CO2: 19 mmol/L — ABNORMAL LOW (ref 22–32)
Calcium: 8.9 mg/dL (ref 8.9–10.3)
Chloride: 107 mmol/L (ref 98–111)
Creatinine, Ser: 1.7 mg/dL — ABNORMAL HIGH (ref 0.44–1.00)
GFR, Estimated: 28 mL/min — ABNORMAL LOW (ref >60)
Glucose, Bld: 96 mg/dL (ref 70–99)
Potassium: 3.9 mmol/L (ref 3.5–5.1)
Sodium: 137 mmol/L (ref 135–145)

## 2022-05-24 MED ORDER — SODIUM CHLORIDE 0.45 % IV SOLN: INTRAVENOUS | Status: DC

## 2022-05-24 NOTE — Progress Notes (Signed)
Orthopedic Tech Progress Note Patient Details:  Jane Andrews Jul 16, 1925 [id number]  Patient ID: [name], female   DOB: 1924/12/26, 88 y.o.   MRN: [id number] I spoke with the patients RN about the soft collar order. They said that the dr requested a smaller collar. I showed her the small collar that I have available and she agreed that it was to small. I told her I would call [facility]. I asked her to get clarification from the dr on if we cant get a smaller soft collar but can get a proper fitting hard collar what would they prefer. Karolee Stamps 05/24/2022, 9:40 PM

## 2022-05-24 NOTE — Progress Notes (Signed)
PROGRESS NOTE   Subjective/Complaints: Slept well again. Has an appetite. Seems to be in better spirits overall.   ROS: Patient denies fever, rash, sore throat, blurred vision, dizziness, nausea, vomiting, diarrhea, cough, shortness of breath or chest pain, joint or back/neck pain, headache, or mood change.      Objective:     No results found. No results for input(s): "WBC", "HGB", "HCT", "PLT" in the last 72 hours.  Recent Labs    05/24/22 0507  NA 137  K 3.9  CL 107  CO2 19*  GLUCOSE 96  BUN 40*  CREATININE 1.70*  CALCIUM 8.9     Intake/Output Summary (Last 24 hours) at 05/24/2022 0919 Last data filed at 05/24/2022 0746 Gross per 24 hour  Intake 450 ml  Output --  Net 450 ml        Physical Exam: Vital Signs Blood pressure 127/76, pulse 92, temperature 97.9 F (36.6 C), temperature source Oral, resp. rate 18, height '5\' 1"'$  (1.549 m), weight 53.2 kg, SpO2 99 %. Constitutional: No distress . Vital signs reviewed. HEENT: NCAT, EOMI, oral membranes moist Neck: supple, cervical collar soiled, too big Cardiovascular: RRR without murmur. No JVD    Respiratory/Chest: CTA Bilaterally without wheezes or rales. Normal effort    GI/Abdomen: BS +, non-tender, non-distended Ext: no clubbing, cyanosis, or edema Psych: pleasant and cooperative  Skin: occipital wound clean/dry. No visible drainage Neuro: Alert and oriented x3. Decreased sensation in bilateral hands and ulnar side of wrist. Proprioceptive deficits in legs too.  4/5 strength in upper extremities, 5/5 strength in lower extremities.  Musculoskeletal: no pain with limb rom.   Assessment/Plan: 1. Functional deficits which require 3+ hours per day of interdisciplinary therapy in a comprehensive inpatient rehab setting. Physiatrist is providing close team supervision and 24 hour management of active medical problems listed below. Physiatrist and rehab team  continue to assess barriers to discharge/monitor patient progress toward functional and medical goals  Care Tool:  Bathing    Body parts bathed by patient: Right arm, Left arm, Chest, Abdomen, Right upper leg, Left upper leg, Face   Body parts bathed by helper: Front perineal area, Buttocks, Right lower leg, Left lower leg     Bathing assist Assist Level: Moderate Assistance - Patient 50 - 74%     Upper Body Dressing/Undressing Upper body dressing   What is the patient wearing?: Button up shirt    Upper body assist Assist Level: Moderate Assistance - Patient 50 - 74%    Lower Body Dressing/Undressing Lower body dressing      What is the patient wearing?: Incontinence brief, Pants     Lower body assist Assist for lower body dressing: Maximal Assistance - Patient 25 - 49%     Toileting Toileting    Toileting assist Assist for toileting: Moderate Assistance - Patient 50 - 74%     Transfers Chair/bed transfer  Transfers assist     Chair/bed transfer assist level: Moderate Assistance - Patient 50 - 74% Chair/bed transfer assistive device: Armrests, Programmer, multimedia   Ambulation assist      Assist level: Moderate Assistance - Patient 50 - 74% Assistive device: Walker-rolling  Max distance: 71f   Walk 10 feet activity   Assist  Walk 10 feet activity did not occur: Safety/medical concerns        Walk 50 feet activity   Assist Walk 50 feet with 2 turns activity did not occur: Safety/medical concerns         Walk 150 feet activity   Assist Walk 150 feet activity did not occur: Safety/medical concerns         Walk 10 feet on uneven surface  activity   Assist Walk 10 feet on uneven surfaces activity did not occur: Safety/medical concerns         Wheelchair     Assist Is the patient using a wheelchair?: Yes Type of Wheelchair: Manual    Wheelchair assist level: Dependent - Patient 0%      Wheelchair 50 feet  with 2 turns activity    Assist        Assist Level: Dependent - Patient 0%   Wheelchair 150 feet activity     Assist      Assist Level: Dependent - Patient 0%   Blood pressure 127/76, pulse 92, temperature 97.9 F (36.6 C), temperature source Oral, resp. rate 18, height '5\' 1"'$  (1.549 m), weight 53.2 kg, SpO2 99 %.  . Medical Problem List and Plan: 1. Functional deficits secondary to Central cord syndrome as well as TBI (SAH/SDH)             -patient may shower, ordered [place] collar for shower             -ELOS/Goals: 25 days            -Continue CIR therapies including PT, OT, and SLP   2.  Antithrombotics: -DVT/anticoagulation:  Pharmaceutical: Lovenox started pm 06/02              -antiplatelet therapy: N/a due to bleed.  3. Pain: continue Tylenol QID, Cymbalta '90mg'$  daily, gabapentin, tramadol 4. Mood/sleep: pt to provide ego support -pt is already on cymbalta and xanax as pta for anxiety disorder   -adjusted xanax to '1mg'$  qhs she uses at home which has helped sleep  - free T4 level ok, acutally a bit high - neuropsych requested -team/family providing positive reinforcement and support -actually seems to be doing better, more positive             -antipsychotic agents: N/A 5. Neuropsych: This patient is capable of making decisions on her own behalf. 6. Skin/Wound Care: wound generally dry. I think she might be loosening it at times when she rubs on pillow, causing some of drainage 7. Fluids/Electrolytes/Nutrition: encourage PO     -BUN with further elevation   -will ad HS IVF 8.  Seizure proph: keppra completed 9. L occipital calvarium Fx: Soft collar at all times with outpatient follow up w/Dr. PAnnette Stable10. R- 2nd rib Fx: Encouraging IS 11. 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  -still having sensory sx in hands, LE's to lesser extent 12. CKD stage IV: Followed by Dr. BTheador HawthorneBaseline SCr- --increased  BUN--add hs IVF - Continue sodium bicarb 13. H/o UGIB/Iron deficiency anemia/HH: Add iron supplement. B12 reviewed and is stable.  14. TURBT 04/16/2022 for invasive high grade bladder CA: h/o dysuria/frequency --was referred for palliative XRT, Dr. MTammi Klippel  -marking starts Monday followed by treatments -sessions after therapy complete   16. 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  17. Vitamin D deficiency: started ergocalciferol 50,000U  once per week for 7 weeks 18. BPPV: changed meclizine to tid prn  -vestibular treatment limited d/t cervical issues  LOS: 7 days A FACE TO FACE EVALUATION WAS PERFORMED  Meredith Staggers 05/24/2022, 9:19 AM

## 2022-05-25 NOTE — Progress Notes (Signed)
Speech Language Pathology Daily Session Note  Patient Details  Name: Jane Andrews MRN: [id number] Date of Birth: [birth year]  Today's Date: 05/25/2022 SLP Individual Time: 8295-6213; 0865-7846 SLP Individual Time Calculation (min): 60 min; 25 min  Short Term Goals: Week 1: SLP Short Term Goal 1 (Week 1): Pt will sustain attention to simple functional task for 10 minutes with Min A. SLP Short Term Goal 2 (Week 1): Pt will recall at least 3 pieces of functional information pertaining to her daily events with use of external and internal aids (WRAP strategies) with Mod A. SLP Short Term Goal 3 (Week 1): Pt will participate in simple problem-solving tasks re: iADL's with 90% accuracy given Mod A. SLP Short Term Goal 4 (Week 1): Pt will complete functional divergent and convergent naming tasks with 90% accuracy given Mod A. SLP Short Term Goal 5 (Week 1): Pt will consume regular diet textures and thin liquids with implementation of safe swallowing strategies with Mod I.  Skilled Therapeutic Interventions:  Session 1: Pt was seen for skilled ST targeting communication goals.  Upon arrival, pt was with nursing donning a clean brief at bed level following incontinence.  Pt requested to transfer to the recliner once in a clean brief.  SLP facilitated the session with a divergent naming task to address word finding.  Pt was able to name specific category members in ~75% of opportunities independently; however, when experience word finding difficulty pt needed up to mod assist to use compensatory strategies.  SLP provided skilled education regarding additional word finding strategies that can facilitate improved functional communication.  Pt was left in recliner with chair alarm set and call bell within reach.  Continue per current plan of care.    Session 2:  Unscheduled make up session.  Pt was seen for skilled ST targeting dysphagia goals.  Upon arrival, pt was in bed but agreeable to sitting up to  participate in treatment.  Pt reported increased reflux symptoms since hospitalization.  SLP provided skilled education regarding strategies to mitigate reflux such as sitting upright for meals (pt reports being in bed partially reclined when eating), staying up for 30-60 min after meals, and alternating between solids and liquids.  Pt consumed a functional snack of regular textures and thin liquids with x1 immediate cough on dry, crumbly cracker consistency.  Pt with good awareness and utilized a liquid wash to mitigate.  Pt was left in bed with bed alarm set and call bell within reach.  Continue per current plan of care.       Pain Pain Assessment Pain Scale: 0-10 Pain Score: 0-No pain  Therapy/Group: Individual Therapy  Lynard Postlewait, Selinda Orion 05/25/2022, 4:50 PM

## 2022-05-25 NOTE — Progress Notes (Signed)
Spoke with Dr Naaman Plummer in regards to soft collar, if unable to obtain a smaller size, he request to have the patient collar replaced with the current size. On coming nurse notified .

## 2022-05-25 NOTE — Progress Notes (Signed)
PROGRESS NOTE   Subjective/Complaints: No new complaints. Slept well again. Wearing same collar as no smaller size available except peds which is too small.   ROS: Patient denies fever, rash, sore throat, blurred vision, dizziness, nausea, vomiting, diarrhea, cough, shortness of breath or chest pain, joint or back/neck pain, headache, or mood change.     Objective:     No results found. No results for input(s): "WBC", "HGB", "HCT", "PLT" in the last 72 hours.  Recent Labs    05/24/22 0507  NA 137  K 3.9  CL 107  CO2 19*  GLUCOSE 96  BUN 40*  CREATININE 1.70*  CALCIUM 8.9     Intake/Output Summary (Last 24 hours) at 05/25/2022 0906 Last data filed at 05/24/2022 1300 Gross per 24 hour  Intake 240 ml  Output --  Net 240 ml        Physical Exam: Vital Signs Blood pressure (!) 123/59, pulse 72, temperature 98 F (36.7 C), temperature source Oral, resp. rate 18, height '5\' 1"'$  (1.549 m), weight 53.8 kg, SpO2 98 %. Constitutional: No distress . Vital signs reviewed. HEENT: NCAT, EOMI, oral membranes moist Neck: soft collar, stained. Too big Cardiovascular: RRR without murmur. No JVD    Respiratory/Chest: CTA Bilaterally without wheezes or rales. Normal effort    GI/Abdomen: BS +, non-tender, non-distended Ext: no clubbing, cyanosis, or edema Psych: pleasant and cooperative  Skin: occipital wound clean/dry.  Neuro: Alert and oriented x3. Decreased sensation in bilateral hands and ulnar side of wrist. Proprioceptive deficits in legs too.  4/5 strength in upper extremities, 5/5 strength in lower extremities.  Motor/sensory stable Musculoskeletal: no pain with limb rom.   Assessment/Plan: 1. Functional deficits which require 3+ hours per day of interdisciplinary therapy in a comprehensive inpatient rehab setting. Physiatrist is providing close team supervision and 24 hour management of active medical problems listed  below. Physiatrist and rehab team continue to assess barriers to discharge/monitor patient progress toward functional and medical goals  Care Tool:  Bathing    Body parts bathed by patient: Right arm, Left arm, Chest, Abdomen, Right upper leg, Left upper leg, Face   Body parts bathed by helper: Front perineal area, Buttocks, Right lower leg, Left lower leg     Bathing assist Assist Level: Moderate Assistance - Patient 50 - 74%     Upper Body Dressing/Undressing Upper body dressing   What is the patient wearing?: Button up shirt    Upper body assist Assist Level: Moderate Assistance - Patient 50 - 74%    Lower Body Dressing/Undressing Lower body dressing      What is the patient wearing?: Incontinence brief, Pants     Lower body assist Assist for lower body dressing: Maximal Assistance - Patient 25 - 49%     Toileting Toileting    Toileting assist Assist for toileting: Moderate Assistance - Patient 50 - 74%     Transfers Chair/bed transfer  Transfers assist     Chair/bed transfer assist level: Moderate Assistance - Patient 50 - 74% Chair/bed transfer assistive device: Armrests, Programmer, multimedia   Ambulation assist      Assist level: Moderate Assistance - Patient  50 - 74% Assistive device: Walker-rolling Max distance: 79f   Walk 10 feet activity   Assist  Walk 10 feet activity did not occur: Safety/medical concerns        Walk 50 feet activity   Assist Walk 50 feet with 2 turns activity did not occur: Safety/medical concerns         Walk 150 feet activity   Assist Walk 150 feet activity did not occur: Safety/medical concerns         Walk 10 feet on uneven surface  activity   Assist Walk 10 feet on uneven surfaces activity did not occur: Safety/medical concerns         Wheelchair     Assist Is the patient using a wheelchair?: Yes Type of Wheelchair: Manual    Wheelchair assist level: Dependent -  Patient 0%      Wheelchair 50 feet with 2 turns activity    Assist        Assist Level: Dependent - Patient 0%   Wheelchair 150 feet activity     Assist      Assist Level: Dependent - Patient 0%   Blood pressure (!) 123/59, pulse 72, temperature 98 F (36.7 C), temperature source Oral, resp. rate 18, height '5\' 1"'$  (1.549 m), weight 53.8 kg, SpO2 98 %.  . Medical Problem List and Plan: 1. Functional deficits secondary to Central cord syndrome as well as TBI (SAH/SDH)             -patient may shower, ordered [place] collar for shower             -ELOS/Goals: 25 days             -Continue CIR therapies including PT, OT, and SLP  2.  Antithrombotics: -DVT/anticoagulation:  Pharmaceutical: Lovenox started pm 06/02              -antiplatelet therapy: N/a due to bleed.  3. Pain: continue Tylenol QID, Cymbalta '90mg'$  daily, gabapentin, tramadol 4. Mood/sleep: pt to provide ego support -pt is already on cymbalta and xanax as pta for anxiety disorder   -adjusted xanax to '1mg'$  qhs she uses at home which has helped sleep  - free T4 level ok, acutally a bit high - neuropsych requested -team/family providing positive reinforcement and support -  seems to be doing better, more positive             -antipsychotic agents: N/A 5. Neuropsych: This patient is capable of making decisions on her own behalf. 6. Skin/Wound Care: wound generally dry. I think she might be loosening it at times when she rubs on pillow, causing some of drainage 7. Fluids/Electrolytes/Nutrition: encourage PO     -BUN with further elevation   -will ad HS IVF 8.  Seizure proph: keppra completed 9. L occipital calvarium Fx: Soft collar at all times with outpatient follow up w/Dr. PAnnette Stable -6/11 [employer] looking into soft collar options. If we can't find a smaller size, just replace with current type.  10. R- 2nd rib Fx: Encouraging IS 11. Severe cervical stenosis: Neuropathy/myelopathy due to fall-->gabapentin  added 06/02-  .  -still having sensory sx in hands, LE's to lesser extent 12. CKD stage IV: Followed by Dr. BTheador HawthorneBaseline SCr- --increased BUN--added hs IVF--recheck labs Monday - Continue sodium bicarb 13. H/o UGIB/Iron deficiency anemia/HH: Add iron supplement. B12 reviewed and is stable.  14. TURBT 04/16/2022 for invasive high grade bladder CA: h/o dysuria/frequency --was referred for palliative XRT, Dr. MTammi Klippel  -  marking starts Monday followed by treatments -sessions after therapy complete   16. 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  17. Vitamin D deficiency: started ergocalciferol 50,000U once per week for 7 weeks 18. BPPV: changed meclizine to tid prn  -vestibular treatment limited d/t cervical issues  LOS: 8 days A FACE TO FACE EVALUATION WAS PERFORMED  Meredith Staggers 05/25/2022, 9:06 AM

## 2022-05-25 NOTE — Progress Notes (Signed)
Continues to maintain soft collar per MD order.   Yehuda Mao, LPN

## 2022-05-26 ENCOUNTER — Ambulatory Visit
Admission: RE | Admit: 2022-05-26 | Discharge: 2022-05-26 | Disposition: A | Discharge status: Home / Self Care | Payer: Medicare Other | Source: Ambulatory Visit | Attending: Radiation Oncology | Admitting: Radiation Oncology

## 2022-05-26 DIAGNOSIS — Z510 Encounter for antineoplastic radiation therapy: Secondary | ICD-10-CM | POA: Insufficient documentation

## 2022-05-26 DIAGNOSIS — C670 Malignant neoplasm of trigone of bladder: Secondary | ICD-10-CM | POA: Diagnosis not present

## 2022-05-26 DIAGNOSIS — S069X0S Unspecified intracranial injury without loss of consciousness, sequela: Secondary | ICD-10-CM

## 2022-05-26 DIAGNOSIS — C679 Malignant neoplasm of bladder, unspecified: Secondary | ICD-10-CM

## 2022-05-26 DIAGNOSIS — S14129S Central cord syndrome at unspecified level of cervical spinal cord, sequela: Secondary | ICD-10-CM

## 2022-05-26 LAB — CBC
HCT: 29.6 % — ABNORMAL LOW (ref 36.0–46.0)
Hemoglobin: 9.5 g/dL — ABNORMAL LOW (ref 12.0–15.0)
MCH: 29.8 pg (ref 26.0–34.0)
MCHC: 32.1 g/dL (ref 30.0–36.0)
MCV: 92.8 fL (ref 80.0–100.0)
Platelets: 179 10*3/uL (ref 150–400)
RBC: 3.19 MIL/uL — ABNORMAL LOW (ref 3.87–5.11)
RDW: 13.8 % (ref 11.5–15.5)
WBC: 7.2 10*3/uL (ref 4.0–10.5)
nRBC: 0 % (ref 0.0–0.2)

## 2022-05-26 LAB — BASIC METABOLIC PANEL
Anion gap: 10 (ref 5–15)
BUN: 36 mg/dL — ABNORMAL HIGH (ref 8–23)
CO2: 18 mmol/L — ABNORMAL LOW (ref 22–32)
Calcium: 8.5 mg/dL — ABNORMAL LOW (ref 8.9–10.3)
Chloride: 108 mmol/L (ref 98–111)
Creatinine, Ser: 1.53 mg/dL — ABNORMAL HIGH (ref 0.44–1.00)
GFR, Estimated: 31 mL/min — ABNORMAL LOW (ref >60)
Glucose, Bld: 91 mg/dL (ref 70–99)
Potassium: 3.7 mmol/L (ref 3.5–5.1)
Sodium: 136 mmol/L (ref 135–145)

## 2022-05-26 MED ORDER — CALCIUM CARBONATE ANTACID 500 MG PO CHEW: 1.0000 | CHEWABLE_TABLET | Freq: Four times a day (QID) | ORAL | Status: DC | PRN

## 2022-05-26 NOTE — Progress Notes (Signed)
Patient ID: Jane Andrews, female   DOB: 1925-08-04, 88 y.o.   MRN: [id number]  SW confirms transportation in place with [facility].   Loralee Pacas, MSW, [place] Office: [phone] Cell: [phone] Fax: [phone]

## 2022-05-26 NOTE — Plan of Care (Signed)
  Problem: RH Balance Goal: LTG Patient will maintain dynamic standing balance (PT) Description: LTG:  Patient will maintain dynamic standing balance with assistance during mobility activities (PT) Flowsheets (Taken 05/26/2022 1150) LTG: Pt will maintain dynamic standing balance during mobility activities with:: (downgraded goal due to slow progress with decreased activity tolerance and symptoms of dizziness with mobility.) Contact Guard/Touching assist   Problem: Sit to Stand Goal: LTG:  Patient will perform sit to stand with assistance level (PT) Description: LTG:  Patient will perform sit to stand with assistance level (PT) Flowsheets (Taken 05/26/2022 1150) LTG: PT will perform sit to stand in preparation for functional mobility with assistance level: (downgraded goal due to slow progress with decreased activity tolerance and symptoms of dizziness with mobility.) Contact Guard/Touching assist   Problem: RH Bed Mobility Goal: LTG Patient will perform bed mobility with assist (PT) Description: LTG: Patient will perform bed mobility with assistance, with/without cues (PT). Flowsheets (Taken 05/26/2022 1150) LTG: Pt will perform bed mobility with assistance level of: (downgraded goal due to slow progress with decreased activity tolerance and symptoms of dizziness with mobility.) Minimal Assistance - Patient > 75%   Problem: RH Bed to Chair Transfers Goal: LTG Patient will perform bed/chair transfers w/assist (PT) Description: LTG: Patient will perform bed to chair transfers with assistance (PT). Flowsheets (Taken 05/26/2022 1150) LTG: Pt will perform Bed to Chair Transfers with assistance level: (downgraded goal due to slow progress with decreased activity tolerance and symptoms of dizziness with mobility.) Minimal Assistance - Patient > 75%   Problem: RH Car Transfers Goal: LTG Patient will perform car transfers with assist (PT) Description: LTG: Patient will perform car transfers with  assistance (PT). Flowsheets (Taken 05/26/2022 1150) LTG: Pt will perform car transfers with assist:: (downgraded goal due to slow progress with decreased activity tolerance and symptoms of dizziness with mobility.) Minimal Assistance - Patient > 75%   Problem: RH Ambulation Goal: LTG Patient will ambulate in controlled environment (PT) Description: LTG: Patient will ambulate in a controlled environment, # of feet with assistance (PT). Flowsheets (Taken 05/26/2022 1150) LTG: Pt will ambulate in controlled environ  assist needed:: (downgraded goal due to slow progress with decreased activity tolerance and symptoms of dizziness with mobility.) Minimal Assistance - Patient > 75% LTG: Ambulation distance in controlled environment: >100 ft using LRAD Goal: LTG Patient will ambulate in home environment (PT) Description: LTG: Patient will ambulate in home environment, # of feet with assistance (PT). Flowsheets (Taken 05/26/2022 1150) LTG: Pt will ambulate in home environ  assist needed:: (downgraded goal due to slow progress with decreased activity tolerance and symptoms of dizziness with mobility.) Minimal Assistance - Patient > 75% LTG: Ambulation distance in home environment: 25 ft using LRAD   Problem: RH Stairs Goal: LTG Patient will ambulate up and down stairs w/assist (PT) Description: LTG: Patient will ambulate up and down # of stairs with assistance (PT) Flowsheets (Taken 05/26/2022 1150) LTG: Pt will ambulate up/down stairs assist needed:: (downgraded goal due to slow progress with decreased activity tolerance and symptoms of dizziness with mobility.) Minimal Assistance - Patient > 75% LTG: Pt will  ambulate up and down number of stairs: 3 steps to enter home using LRAD

## 2022-05-26 NOTE — Progress Notes (Signed)
PROGRESS NOTE   Subjective/Complaints:  Hands feel like "sandpaper" Word finding issues Pt worried about getting tired from rad onc tx   ROS: Patient denies CP, SOB, N/V/D   Objective:     No results found. Recent Labs    05/26/22 0528  WBC 7.2  HGB 9.5*  HCT 29.6*  PLT 179    Recent Labs    05/24/22 0507 05/26/22 0528  NA 137 136  K 3.9 3.7  CL 107 108  CO2 19* 18*  GLUCOSE 96 91  BUN 40* 36*  CREATININE 1.70* 1.53*  CALCIUM 8.9 8.5*      Intake/Output Summary (Last 24 hours) at 05/26/2022 1228 Last data filed at 05/26/2022 0700 Gross per 24 hour  Intake 220 ml  Output --  Net 220 ml         Physical Exam: Vital Signs Blood pressure 113/66, pulse 83, temperature 97.6 F (36.4 C), temperature source Oral, resp. rate 18, height '5\' 1"'$  (1.549 m), weight 53.8 kg, SpO2 99 %.  General: No acute distress Mood and affect are appropriate Heart: Regular rate and rhythm no rubs murmurs or extra sounds Lungs: Clear to auscultation, breathing unlabored, no rales or wheezes Abdomen: Positive bowel sounds, soft nontender to palpation, nondistended Extremities: No clubbing, cyanosis, or edema Skin: No evidence of breakdown, no evidence of rash   Skin: occipital wound clean/dry.  Neuro: Alert and oriented x3. Decreased sensation in bilateral hands and ulnar side of wrist. Proprioceptive deficits in legs too.  4/5 strength in upper extremities, 5/5 strength in lower extremities.  Motor/sensory good ID LT on fingers, 50% accurate on toes  Musculoskeletal: no pain with limb rom.   Assessment/Plan: 1. Functional deficits which require 3+ hours per day of interdisciplinary therapy in a comprehensive inpatient rehab setting. Physiatrist is providing close team supervision and 24 hour management of active medical problems listed below. Physiatrist and rehab team continue to assess barriers to discharge/monitor  patient progress toward functional and medical goals  Care Tool:  Bathing    Body parts bathed by patient: Right arm, Left arm, Chest, Abdomen, Right upper leg, Left upper leg, Face   Body parts bathed by helper: Front perineal area, Buttocks, Right lower leg, Left lower leg     Bathing assist Assist Level: Moderate Assistance - Patient 50 - 74%     Upper Body Dressing/Undressing Upper body dressing   What is the patient wearing?: Button up shirt    Upper body assist Assist Level: Moderate Assistance - Patient 50 - 74%    Lower Body Dressing/Undressing Lower body dressing      What is the patient wearing?: Incontinence brief, Pants     Lower body assist Assist for lower body dressing: Maximal Assistance - Patient 25 - 49%     Toileting Toileting    Toileting assist Assist for toileting: Moderate Assistance - Patient 50 - 74%     Transfers Chair/bed transfer  Transfers assist     Chair/bed transfer assist level: Moderate Assistance - Patient 50 - 74% Chair/bed transfer assistive device: Armrests, Programmer, multimedia   Ambulation assist      Assist level: Moderate Assistance -  Patient 50 - 74% Assistive device: Walker-rolling Max distance: 68f   Walk 10 feet activity   Assist  Walk 10 feet activity did not occur: Safety/medical concerns        Walk 50 feet activity   Assist Walk 50 feet with 2 turns activity did not occur: Safety/medical concerns         Walk 150 feet activity   Assist Walk 150 feet activity did not occur: Safety/medical concerns         Walk 10 feet on uneven surface  activity   Assist Walk 10 feet on uneven surfaces activity did not occur: Safety/medical concerns         Wheelchair     Assist Is the patient using a wheelchair?: Yes Type of Wheelchair: Manual    Wheelchair assist level: Dependent - Patient 0%      Wheelchair 50 feet with 2 turns activity    Assist         Assist Level: Dependent - Patient 0%   Wheelchair 150 feet activity     Assist      Assist Level: Dependent - Patient 0%   Blood pressure 113/66, pulse 83, temperature 97.6 F (36.4 C), temperature source Oral, resp. rate 18, height '5\' 1"'$  (1.549 m), weight 53.8 kg, SpO2 99 %.  . Medical Problem List and Plan: 1. Functional deficits secondary to Central cord syndrome as well as TBI (SAH/SDH)             -patient may shower, ordered [place] collar for shower             -ELOS/Goals: 25 days             -Continue CIR therapies including PT, OT, and SLP  2.  Antithrombotics: -DVT/anticoagulation:  Pharmaceutical: Lovenox started pm 06/02              -antiplatelet therapy: N/a due to bleed.  3. Pain: continue Tylenol QID, Cymbalta '90mg'$  daily, gabapentin, tramadol 4. Mood/sleep: pt to provide ego support -pt is already on cymbalta and xanax as pta for anxiety disorder   -adjusted xanax to '1mg'$  qhs she uses at home which has helped sleep  - free T4 level ok, acutally a bit high - neuropsych requested -team/family providing positive reinforcement and support -  seems to be doing better, more positive             -antipsychotic agents: N/A 5. Neuropsych: This patient is capable of making decisions on her own behalf. 6. Skin/Wound Care: wound generally dry. I think she might be loosening it at times when she rubs on pillow, causing some of drainage 7. Fluids/Electrolytes/Nutrition: encourage PO     -BUN with further elevation   -will ad HS IVF 8.  Seizure proph: keppra completed 9. L occipital calvarium Fx: Soft collar at all times with outpatient follow up w/Dr. PAnnette Stable -no ecchymosis or drainage  10. R- 2nd rib Fx: Encouraging IS 11. 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  -still having sensory sx in hands, LE's to lesser extent, feels like sandpaper but not painful  12. CKD stage IV: Followed by Dr. BTheador HawthorneBaseline  SCr- --increased BUN--added hs IVF--recheck labs Monday - Continue sodium bicarb 13. H/o UGIB/Iron deficiency anemia/HH: Add iron supplement. B12 reviewed and is stable.  14. TURBT 04/16/2022 for invasive high grade bladder CA: h/o dysuria/frequency --was referred for palliative XRT, Dr. MTammi Klippel  -marking starts Monday followed by treatments -sessions  after therapy complete- pt worried about her endurance    16. 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  17. Vitamin D deficiency: started ergocalciferol 50,000U once per week for 7 weeks 18. BPPV: changed meclizine to tid prn  -vestibular treatment limited d/t cervical issues  LOS: 9 days A FACE TO FACE EVALUATION WAS PERFORMED  Charlett Blake 05/26/2022, 12:28 PM

## 2022-05-26 NOTE — Progress Notes (Signed)
  Radiation Oncology         [phone] ________________________________  Name: Jane Andrews MRN: [id number]  Date: 05/26/2022  DOB: 1925/02/02  INPATIENT  SIMULATION AND TREATMENT PLANNING NOTE    ICD-10-CM   1. 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  Identity was confirmed.  All relevant records and images related to the planned course of therapy were reviewed.  The patient freely provided informed written consent to proceed with treatment after reviewing the details related to the planned course of therapy. The consent form was witnessed and verified by the simulation staff.  Then, the patient was set-up in a stable reproducible  supine position for radiation therapy.  CT images were obtained.  Surface markings were placed.  The CT images were loaded into the planning software.  Then the target and avoidance structures were contoured.  Treatment planning then occurred.  The radiation prescription was entered and confirmed.  Then, I designed and supervised the construction of a total of 5 medically necessary complex treatment devices including a body positioner and 4 MLCs to treat the tumor from orthogonal angles using independent collimation accounting for beam divergence.  I have requested : 3D Simulation  I have requested a DVH of the following structures: bowel, rectum, hips and target.  PLAN:  The patient will receive 28 Gy in 8 fractions to be followed by break with a potential 2nd few weeks of radiation after a break to assess tolerance, using split-course strategy.  ________________________________  Sheral Apley Tammi Klippel, M.D.

## 2022-05-26 NOTE — Progress Notes (Signed)
Physical Therapy Weekly Progress Note  Patient Details  Name: Jane Andrews MRN: [id number] Date of Birth: April 11, 1925  Beginning of progress report period: May 18, 2022 End of progress report period: May 26, 2022  Today's Date: 05/26/2022 PT Individual Time: 1108-1205 PT Individual Time Calculation (min): 57 min   Patient has met 2 of 3 short term goals.  Patient with slow progress this week due to decreased activity tolerance secondary to symptoms of fatigue and intermittent dizziness. Patient noted to express symptoms of depression with loss of independence with functional mobility with present deficits, MD addressing. Patient currently performs mobility with mod A due to motor planning deficits and lower extremity ataxia. She is ambulating up to 50 feet using a RW with intermittent use of ankle weights to address ataxia with improved limb placement.   Patient continues to demonstrate the following deficits muscle weakness, decreased cardiorespiratoy endurance, ataxia, decreased coordination, and decreased motor planning, decreased problem solving, decreased memory, and demonstrates behaviors consistent with Rancho Level VIII, peripheral vs central vestibular deficits (unable to formally assess due to cervical precautions), and decreased sitting balance, decreased standing balance, decreased postural control, and decreased balance strategies and therefore will continue to benefit from skilled PT intervention to increase functional independence with mobility.  Patient not progressing toward long term goals.  See goal revision..  Plan of care revisions: downgraded LTGs to min A-CGA overall due to slow progress with functional mobility secondary to factors listed above.  PT Short Term Goals Week 1:  PT Short Term Goal 1 (Week 1): Patient will perform basic transfers with equal to or better than mod A consistently. PT Short Term Goal 1 - Progress (Week 1): Met PT Short Term Goal 2 (Week 1):  Patient will ambulate >50 ft using LRAD with mod A. PT Short Term Goal 2 - Progress (Week 1): Met PT Short Term Goal 3 (Week 1): Patient will initiate stair training. PT Short Term Goal 3 - Progress (Week 1): Progressing toward goal Week 2:  PT Short Term Goal 1 (Week 2): Patient will perform bed mobility with min A consistently. PT Short Term Goal 2 (Week 2): Patient will perform basic transfers with min A >50% of the time. PT Short Term Goal 3 (Week 2): Patient will initiate stair training. PT Short Term Goal 4 (Week 2): Patient will ambulate >50 ft with min A using LRAD.  Skilled Therapeutic Interventions/Progress Updates:     Patient in bed upon PT arrival. Patient alert and agreeable to PT session. Patient denied pain during session. Patient continues to report persistent fatigue and intermittent dizziness with supine to/from sit.   Patient initially declined getting out of bed due to fatigue. Spent increased time reviewing patient's goals, see POC note for goal revisions, educating on need for 24/7 supervision/assist. Patient expressed "depressed" feeling about her loss of independence and challenges with ambulation at this time. Provided therapeutic listening and education on her injuries, recovery, and coping strategies.   Therapeutic Activity: Bed Mobility: Patient performed supine to/from sit with min A with HOB elevated. Provided verbal cues for initiation and log roll technique to maintain cervical precautions. Transfers: Patient performed sit to/from stand form the hospital bed, w/c, and toilet with mod progressing to min A using a RW. Provided verbal cues for hand placement, forward weight shift, pressing her toes into the floor to reduce posterior bias, and completing turns with controlled descent to sit safely. Patient was continent of bladder during toileting, performed peri-care with set-up assist  seated and lower body clothing management with total A due to need for B upper  extremity support in standing using RW.  Gait Training:  Patient ambulated 34 feet and 15-20 feet x2 using RW with mod-min A. Ambulated with decreased gait speed, decreased step length and height R>L, narrow BOS, intermittent variable foot placement, >on turns, forward trunk lean, and downward head gaze. Provided verbal cues and facilitation for AD management, increased BOS with visual cues for stepping to the outside of the walker, and increased weight shift to improve foot clearance.  Neuromuscular Re-ed: Patient performed the following lower extremity motor control activities: -heel to shin R/L 3x5 focused on smooth direct tracking coordination worse R>L -seated step-taps on 4" step focused on hip flexion activation, precise foot placement, and reciprocal pattern -sit to/from stand x3 focused on forwards weight shift with mod progressing to min A for facilitation of weight shift without an AD  Patient sitting up in bed at end of session with breaks locked, bed alarm set, and all needs within reach.   Therapy Documentation Precautions:  Precautions Precautions: Fall, Cervical Precaution Booklet Issued: No Precaution Comments: verbal review of neck precautions, will need reinforcement Required Braces or Orthoses: Cervical Brace Cervical Brace: Soft collar Restrictions Weight Bearing Restrictions: No Other Position/Activity Restrictions: 2nd rib fx   Therapy/Group: Individual Therapy  Euleta Belson L Carlie Solorzano PT, DPT  05/26/2022, 3:50 PM

## 2022-05-26 NOTE — Progress Notes (Addendum)
Occupational Therapy Session Note  Patient Details  Name: Jane Andrews MRN: [id number] Date of Birth: August 23, 1925  Today's Date: 05/26/2022 OT Individual Time: 3419-6222 OT Individual Time Calculation (min): 30 min    Short Term Goals: Week 1:  OT Short Term Goal 1 (Week 1): Pt will transfer to [facility] with mod A consistently OT Short Term Goal 2 (Week 1): Pt will be able to tolerate sitting EOB to perform UB ADL with close supervision OT Short Term Goal 3 (Week 1): Pt  will thread underwear/ brief and pants with min A witout AE   Skilled Therapeutic Interventions/Progress Updates:    Pt semi reclined in bed, reports she already had breakfast, and no c/o pain.  Agreeable to sitting up in recliner and requesting to change into clean pajamas.  Pt doffed/donned button down shirt with mod assist and also needing nurse assist to disconnect/reconnect IV.  Supine to sit EOB with supervision and increased time using bed features.  Pt donned pants at sit<>stand level using RW with mod assist to thread over feet and maintain standing balance.  Pt completed stand pivot using RW to recliner with mod assist noting ataxia BLE and difficulty motor planning.  Pt brushed teeth and washed face in seated position with setup.  Soft call bell in reach, seat alarm on. Ensured lights on and blinds open to promote day/night schedule per behavior plan.  Therapy Documentation Precautions:  Precautions Precautions: Fall, Cervical Precaution Booklet Issued: No Precaution Comments: verbal review of neck precautions, will need reinforcement Required Braces or Orthoses: Cervical Brace Cervical Brace: Soft collar Restrictions Weight Bearing Restrictions: No Other Position/Activity Restrictions: 2nd rib fx  Agitated Behavior Scale: TBI Observation Details Observation Environment: pt room Start of observation period - Date: 05/26/22 Start of observation period - Time: 0815 End of observation period - Date:  05/26/22 End of observation period - Time: 0845 Agitated Behavior Scale (DO NOT LEAVE BLANKS) Short attention span, easy distractibility, inability to concentrate: Absent Impulsive, impatient, low tolerance for pain or frustration: Absent Uncooperative, resistant to care, demanding: Absent Violent and/or threatening violence toward people or property: Absent Explosive and/or unpredictable anger: Absent Rocking, rubbing, moaning, or other self-stimulating behavior: Absent Pulling at tubes, restraints, etc.: Absent Wandering from treatment areas: Absent Restlessness, pacing, excessive movement: Absent Repetitive behaviors, motor, and/or verbal: Absent Rapid, loud, or excessive talking: Absent Sudden changes of mood: Absent Easily initiated or excessive crying and/or laughter: Absent Self-abusiveness, physical and/or verbal: Absent Agitated behavior scale total score: 14   Therapy/Group: Individual Therapy  Ezekiel Slocumb 05/26/2022, 12:14 PM

## 2022-05-26 NOTE — Progress Notes (Signed)
Orthopedic Tech Progress Note Patient Details:  Jane Andrews 1925-09-13 [id number]  Called to see if patient had SMALL SOFT COLLAR RN said patient had on a nice fitting soft collar  Patient ID: Jane Andrews, female   DOB: 1925-03-23, 88 y.o.   MRN: [id number]  [name] 05/26/2022, 9:31 AM

## 2022-05-26 NOTE — Progress Notes (Signed)
Physical Therapy Session Note  Patient Details  Name: Jane Andrews MRN: [id number] Date of Birth: Sep 26, 1925  Today's Date: 05/26/2022 PT Individual Time: 1304-1330 PT Individual Time Calculation (min): 26 min   Short Term Goals: Week 2:  PT Short Term Goal 1 (Week 2): Patient will perform bed mobility with min A consistently. PT Short Term Goal 2 (Week 2): Patient will perform basic transfers with min A >50% of the time. PT Short Term Goal 3 (Week 2): Patient will initiate stair training. PT Short Term Goal 4 (Week 2): Patient will ambulate >50 ft with min A using LRAD.  Skilled Therapeutic Interventions/Progress Updates:     Pt received supine in bed and agrees to therapy. No complaint of pain. Supine to sit with minA and cues for body mechanics and sequencing. Pt performs stand pivot transfer to [facility] with moda and cues for anterior weight shift, hand placement, and sequencing. WC transport to gym for time management. Pt ambulates multiple bouts with RW. PT provides cues for hand placement and sequencing of sit to stand, with pt completing with minA overall. Pt ambulates x50', x100', and x100', with seated rest breaks. PT provides minA/modA overall but one instance of maxA due to pt having LOB anteriorly during turn. PT provides consistent verbal cues to maintain wide stride width, especially on L, as well as maintaining upright gaze to improve posture and balance. WC transport back to room. Stand pivot back to bed with modA. minA for sit to supine. Left with alarm intact and all needs within reach.  Therapy Documentation Precautions:  Precautions Precautions: Fall, Cervical Precaution Booklet Issued: No Precaution Comments: verbal review of neck precautions, will need reinforcement Required Braces or Orthoses: Cervical Brace Cervical Brace: Soft collar Restrictions Weight Bearing Restrictions: No Other Position/Activity Restrictions: 2nd rib fx   Therapy/Group: Individual  Therapy  Breck Coons, PT, DPT 05/26/2022, 6:22 PM

## 2022-05-26 NOTE — Discharge Instructions (Addendum)
Inpatient Rehab Discharge Instructions  Jane Andrews Discharge date and time:    Activities/Precautions/ Functional Status: Activity: no lifting, driving, or strenuous exercise till cleared by MD Diet: regular diet Wound Care: none needed   Functional status:  ___ No restrictions     ___ Walk up steps independently ___ 24/7 supervision/assistance   ___ Walk up steps with assistance ___ Intermittent supervision/assistance  ___ Bathe/dress independently ___ Walk with walker     ___ Bathe/dress with assistance ___ Walk Independently    ___ Shower independently ___ Walk with assistance    ___ Shower with assistance ___ No alcohol     ___ Return to work/school ________   COMMUNITY REFERRALS UPON DISCHARGE:    Home Health:   PT     OT     ST     SNA                    Agency: [facility]          Phone: [phone] *please expect follow-up within 2-3 days to schedule your home visit. If you have not received follow-up, be sure  to contact the branch directly.*    Medical Equipment/Items Ordered: tub transfer bench, 3in1 bedside commode, rolling walker, transport chair                                                 Agency/Supplier: [facility] [phone]   Special Instructions:    My questions have been answered and I understand these instructions. I will adhere to these goals and the provided educational materials after my discharge from the hospital.  Patient/Caregiver Signature _______________________________ Date __________  Clinician Signature _______________________________________ Date __________  Please bring this form and your medication list with you to all your follow-up doctor's appointments.

## 2022-05-26 NOTE — Progress Notes (Signed)
Occupational Therapy Session Note  Patient Details  Name: Jane Andrews MRN: [id number] Date of Birth: 1925/11/20  Today's Date: 05/26/2022 OT Individual Time: 5940-9050 OT Individual Time Calculation (min): 70 min    Short Term Goals: Week 1:  OT Short Term Goal 1 (Week 1): Pt will transfer to [facility] with mod A consistently OT Short Term Goal 2 (Week 1): Pt will be able to tolerate sitting EOB to perform UB ADL with close supervision OT Short Term Goal 3 (Week 1): Pt  will thread underwear/ brief and pants with min A witout AE  Skilled Therapeutic Interventions/Progress Updates:    Pt received sitting in the recliner and agreeable to OT tx. No reports of pain. However, pt voiced strong feelings of depression and discouragement with her current condition. Discussed previous interests at home including church community/attendance, painting, and other hobbies that may be good to incorporate into therapy. Completed sit > stand with Mod A and took a couple of steps with RW to w/c. Notable ataxia of BLE noted when walking. Pt transported to the rehab gym for time management. 1 lbs ankle weights placed on for proprioceptive input. Pt completed functional mobility across the rehab gym with Min A with RW, 50 ft. Pt sat at the [facility] and completed clothes pin activity with yellow, red, and green colors. Completed 4 sets of 12 clothes pins on the middle/far row. Pt verbalized frustration at decreased strength and independence in activities. Focused conversation on ways to enhance participation in activities at home/in the hospital like listening to the bible/church instead of reading d/t slowness. Pt transported back to room for time management. Pt left supine in bed with call pad in reach, bed alarm in reach, and all needs met.   Therapy Documentation Precautions:  Precautions Precautions: Fall, Cervical Precaution Booklet Issued: No Precaution Comments: verbal review of neck precautions, will need  reinforcement Required Braces or Orthoses: Cervical Brace Cervical Brace: Soft collar Restrictions Weight Bearing Restrictions: No Other Position/Activity Restrictions: 2nd rib fx  Therapy/Group: Individual Therapy  Catalina Lunger 05/26/2022, 7:14 AM

## 2022-05-27 ENCOUNTER — Other Ambulatory Visit: Payer: Self-pay

## 2022-05-27 ENCOUNTER — Ambulatory Visit
Admit: 2022-05-27 | Discharge: 2022-05-27 | Disposition: A | Discharge status: Home / Self Care | Payer: Medicare Other | Attending: Radiation Oncology | Admitting: Radiation Oncology

## 2022-05-27 DIAGNOSIS — Z510 Encounter for antineoplastic radiation therapy: Secondary | ICD-10-CM | POA: Diagnosis not present

## 2022-05-27 DIAGNOSIS — C670 Malignant neoplasm of trigone of bladder: Secondary | ICD-10-CM | POA: Diagnosis not present

## 2022-05-27 LAB — RAD ONC ARIA SESSION SUMMARY
Course Elapsed Days: 0
Plan Fractions Treated to Date: 1
Plan Prescribed Dose Per Fraction: 3.5 Gy
Plan Total Fractions Prescribed: 8
Plan Total Prescribed Dose: 28 Gy
Reference Point Dosage Given to Date: 3.5 Gy
Reference Point Session Dosage Given: 3.5 Gy
Session Number: 1

## 2022-05-27 NOTE — Patient Care Conference (Signed)
Inpatient RehabilitationTeam Conference and Plan of Care Update Date: 05/27/2022   Time: 10:02 AM    Patient Name: Jane Andrews      Medical Record Number: [id number]  Date of Birth: August 31, 1925 Sex: Female         Room/Bed: 4W02C/4W02C-01 Payor Info: Payor: MEDICARE / Plan: MEDICARE PART A AND B / Product Type: *No Product type* /    Admit Date/Time:  05/17/2022  6:57 PM  Primary Diagnosis:  Central cord syndrome of cervical spinal cord [facility])  Hospital Problems: Principal Problem:   Central cord syndrome of cervical spinal cord ([place]) Active Problems:   TBI (traumatic brain injury) [facility])    Expected Discharge Date: Expected Discharge Date: 06/06/22  Team Members Present: Physician leading conference: Dr. Alger Simons Social Worker Present: Loralee Pacas, [place] Nurse Present: Dorthula Nettles, RN PT Present: Apolinar Junes, PT OT Present: Willeen Cass, OT SLP Present: Weston Anna, SLP PPS Coordinator present : Gunnar Fusi, SLP     Current Status/Progress Goal Weekly Team Focus  Bowel/Bladder   continent to bowel with episodes of incontinent bladder  pt to have less frequent incontinent episodes  encourage Q3 toileting   Swallow/Nutrition/ Hydration   Regular textures with thin liquids, Mod I  Mod I  Goal Met   ADL's   Making steady progress, min-mod A overall. Still reports feelings of depression. Barriers to d/c will include need for 24/7 supervision and assist with all IADLs  supervision overall- may need to downgrade  ADLs, transfers, [facility], strengthening, safety, cognition, d/c planning   Mobility   Min A overall with intermittent mod A with deficits of motor planning and ataxia, gait up to 100 ft with RW  Downgraded to CGA-min A overall due to slow progress secondary to fatigue/dizziness  Balance, functional mobility, motor planning, gait and stair training, activity tolerance, sensory integration, safety awareness, patient/caregiver education    Communication   Supervision-Min A  Supervision  use of word-finding strategies   Safety/Cognition/ Behavioral Observations  Min-Mod A  Supervision  complex problem solving, recall with use of strategies, sustained attention and emergent awareness   Pain   pain controlled on current regimen, occasional request for pain medication  pain <3  assess q 4hr and prn   Skin   incision CDI  no new breakdown  assess skin q shift and prn     Discharge Planning:  D/c to home with support from her niece [name] (retiring at the end of the month) and hired caregiver support. Pt begins radiation treatment 6/12-6/22.   Team Discussion: Doing well. Sleeping and anxiety improved. Depressed. Marking today for radiation. Continent B/B, no pain reported. Scalp incision CDI. Will schedule family education. Receiving radiation treatments all week. Moving bedroom downstairs. BP doing okay. Regular diet, thin liquids.  Patient on target to meet rehab goals: yes, CGA with min assist intermittently. Currently min/mod assist overall. Min assist transfers and gait. Making slow, steady progress. Word finding improved. Speech intelligibility improved.   *See Care Plan and progress notes for long and short-term goals.   Revisions to Treatment Plan:  Adjusting medications.   Teaching Needs: Family education, medication management, transfer/gait training, etc.   Current Barriers to Discharge: Home enviroment access/layout, Lack of/limited family support, Pending chemo/radiation, and 24/7 supervision   Possible Resolutions to Barriers: Family education Follow-up therapy Radiation therapy     Medical Summary Current Status: improved sleep, anxiety better. pain controlled. starting xrt this week  Barriers to Discharge: Behavior;Medical stability   Possible  Resolutions to [employer]: egosupport by team. xrt after therapy to maximize participation   Continued Need for Acute Rehabilitation Level of  Care: The patient requires daily medical management by a physician with specialized training in physical medicine and rehabilitation for the following reasons: Direction of a multidisciplinary physical rehabilitation program to maximize functional independence : Yes Medical management of patient stability for increased activity during participation in an intensive rehabilitation regime.: Yes Analysis of laboratory values and/or radiology reports with any subsequent need for medication adjustment and/or medical intervention. : Yes   I attest that I was present, lead the team conference, and concur with the assessment and plan of the team.   Cristi Loron 05/27/2022, 12:45 PM

## 2022-05-27 NOTE — Progress Notes (Signed)
Speech Language Pathology Weekly Progress and Session Note  Patient Details  Name: Jane Andrews MRN: [id number] Date of Birth: 03-17-1925  Beginning of progress report period: May 19, 2022 End of progress report period: May 27, 2022  Today's Date: 05/27/2022 SLP Individual Time: 6160-7371 SLP Individual Time Calculation (min): 45 min  Short Term Goals: Week 1: SLP Short Term Goal 1 (Week 1): Pt will sustain attention to simple functional task for 10 minutes with Min A. SLP Short Term Goal 1 - Progress (Week 1): Met SLP Short Term Goal 2 (Week 1): Pt will recall at least 3 pieces of functional information pertaining to her daily events with use of external and internal aids (WRAP strategies) with Mod A. SLP Short Term Goal 2 - Progress (Week 1): Met SLP Short Term Goal 3 (Week 1): Pt will participate in simple problem-solving tasks re: iADL's with 90% accuracy given Mod A. SLP Short Term Goal 3 - Progress (Week 1): Met SLP Short Term Goal 4 (Week 1): Pt will complete functional divergent and convergent naming tasks with 90% accuracy given Mod A. SLP Short Term Goal 4 - Progress (Week 1): Met SLP Short Term Goal 5 (Week 1): Pt will consume regular diet textures and thin liquids with implementation of safe swallowing strategies with Mod I. SLP Short Term Goal 5 - Progress (Week 1): Met    New Short Term Goals: Week 2: SLP Short Term Goal 1 (Week 2): Patient will demonstrate functional problem solving with mildly complex tasks with Min verbal cues. SLP Short Term Goal 2 (Week 2): Patient will recall at least 3 pieces of functional information pertaining to her daily events with use of external and internal aids (WRAP strategies) with Min A verbal cues. SLP Short Term Goal 3 (Week 2): Patient will self-monitor and correct errors during functional tasks with Min verbal cues. SLP Short Term Goal 4 (Week 2): Patient will demonstrate sustained attention to functional tasks for 30 minutes  with Min verbal cues for redirection.  Weekly Progress Updates: Patient has made excellent gains and has met 5 of 5 STGs this reporting period. Currently, patient is consuming regular textures with thin liquids with minimal overt s/s of aspiration and is overall Mod I for use of swallowing compensatory strategies. Patient also demonstrates improved word-finding in both structured tasks and informal conversation requiring intermittent verbal cues for use of compensatory strategies. Patient is demonstrating behaviors consistent with a Rancho Level VII and requires overall Min-Mod A verbal cues to complete functional and familiar tasks safely in regards to problem solving, recall, sustained attention and awareness. Patient and family education ongoing. Patient would benefit from continued skilled SLP intervention to maximize her cognitive-linguistic functioning prior to discharge.      Intensity: Minumum of 1-2 x/day, 30 to 90 minutes Frequency: 3 to 5 out of 7 days Duration/Length of Stay: 06/06/22 Treatment/Interventions: Cognitive remediation/compensation;Functional tasks;Internal/external aids;Environmental controls;Patient/family education;Cueing hierarchy;Therapeutic Activities   Daily Session  Skilled Therapeutic Interventions:  Skilled treatment session focused on cognitive goals. SLP facilitated session by providing Min verbal cues for error awareness as well as working memory during a mildly complex medication management task in which patient had to identify errors on both a BID and TID pill box. Patient demonstrated sustained attention to task for ~ 30 minutes with supervision verbal cues for redirection. Patient left upright in bed with alarm on and all needs within reach. Continue with current plan of care.      Pain Intermittent pain when sitting  upright in bed, patient repositioned    Therapy/Group: Individual Therapy  PAYNE, COURTNEY 05/27/2022, 6:21 AM

## 2022-05-27 NOTE — Progress Notes (Signed)
PROGRESS NOTE   Subjective/Complaints: No new issues. Received new collar.  Ongoing sensory issues in hands  ROS: Patient denies fever, rash, sore throat, blurred vision, dizziness, nausea, vomiting, diarrhea, cough, shortness of breath or chest pain, joint or back/neck pain  .   Objective:     No results found. Recent Labs    05/26/22 0528  WBC 7.2  HGB 9.5*  HCT 29.6*  PLT 179    Recent Labs    05/26/22 0528  NA 136  K 3.7  CL 108  CO2 18*  GLUCOSE 91  BUN 36*  CREATININE 1.53*  CALCIUM 8.5*     Intake/Output Summary (Last 24 hours) at 05/27/2022 1113 Last data filed at 05/27/2022 1025 Gross per 24 hour  Intake 460 ml  Output --  Net 460 ml        Physical Exam: Vital Signs Blood pressure 137/68, pulse 83, temperature 98 F (36.7 C), resp. rate 16, height '5\' 1"'$  (1.549 m), weight 53.8 kg, SpO2 96 %.  Constitutional: No distress . Vital signs reviewed. HEENT: NCAT, EOMI, oral membranes moist Neck: supple Cardiovascular: RRR without murmur. No JVD    Respiratory/Chest: CTA Bilaterally without wheezes or rales. Normal effort    GI/Abdomen: BS +, non-tender, non-distended Ext: no clubbing, cyanosis, or edema Psych: pleasant and cooperative  Skin: occipital wound clean/dry. Area remains sens to palpation Neuro: Alert and oriented x3. Decreased sensation in bilateral hands and ulnar side of wrist. Proprioceptive deficits in legs too.  4/5 strength in upper extremities, 5/5 strength in lower extremities.  Motor exam stable.  Musculoskeletal: no pain with limb rom.   Assessment/Plan: 1. Functional deficits which require 3+ hours per day of interdisciplinary therapy in a comprehensive inpatient rehab setting. Physiatrist is providing close team supervision and 24 hour management of active medical problems listed below. Physiatrist and rehab team continue to assess barriers to discharge/monitor patient  progress toward functional and medical goals  Care Tool:  Bathing    Body parts bathed by patient: Right arm, Left arm, Chest, Abdomen, Right upper leg, Left upper leg, Face   Body parts bathed by helper: Front perineal area, Buttocks, Right lower leg, Left lower leg     Bathing assist Assist Level: Moderate Assistance - Patient 50 - 74%     Upper Body Dressing/Undressing Upper body dressing   What is the patient wearing?: Button up shirt    Upper body assist Assist Level: Moderate Assistance - Patient 50 - 74%    Lower Body Dressing/Undressing Lower body dressing      What is the patient wearing?: Incontinence brief, Pants     Lower body assist Assist for lower body dressing: Maximal Assistance - Patient 25 - 49%     Toileting Toileting    Toileting assist Assist for toileting: Moderate Assistance - Patient 50 - 74%     Transfers Chair/bed transfer  Transfers assist     Chair/bed transfer assist level: Moderate Assistance - Patient 50 - 74% Chair/bed transfer assistive device: Armrests, Programmer, multimedia   Ambulation assist      Assist level: Moderate Assistance - Patient 50 - 74% Assistive device: Walker-rolling  Max distance: 94f   Walk 10 feet activity   Assist  Walk 10 feet activity did not occur: Safety/medical concerns        Walk 50 feet activity   Assist Walk 50 feet with 2 turns activity did not occur: Safety/medical concerns         Walk 150 feet activity   Assist Walk 150 feet activity did not occur: Safety/medical concerns         Walk 10 feet on uneven surface  activity   Assist Walk 10 feet on uneven surfaces activity did not occur: Safety/medical concerns         Wheelchair     Assist Is the patient using a wheelchair?: Yes Type of Wheelchair: Manual    Wheelchair assist level: Dependent - Patient 0%      Wheelchair 50 feet with 2 turns activity    Assist        Assist  Level: Dependent - Patient 0%   Wheelchair 150 feet activity     Assist      Assist Level: Dependent - Patient 0%   Blood pressure 137/68, pulse 83, temperature 98 F (36.7 C), resp. rate 16, height '5\' 1"'$  (1.549 m), weight 53.8 kg, SpO2 96 %.  . Medical Problem List and Plan: 1. Functional deficits secondary to Central cord syndrome as well as TBI (SAH/SDH)             -patient may shower, ordered [place] collar for shower             -ELOS/Goals: 25 days           -Continue CIR therapies including PT, OT, and SLP. Interdisciplinary team conference today to discuss goals, barriers to discharge, and dc planning.   2.  Antithrombotics: -DVT/anticoagulation:  Pharmaceutical: Lovenox started pm 06/02              -antiplatelet therapy: N/a due to bleed.  3. Pain: continue Tylenol QID, Cymbalta '90mg'$  daily, gabapentin, tramadol 4. Mood/sleep: pt to provide ego support -pt is already on cymbalta and xanax as pta for anxiety disorder   -adjusted xanax to '1mg'$  qhs she uses at home which has helped sleep  - free T4 level ok, acutally a bit high - neuropsych requested -team/family providing positive reinforcement and support - pt more positive, but therapy notices depressive comments during activities             -antipsychotic agents: N/A 5. Neuropsych: This patient is capable of making decisions on her own behalf. 6. Skin/Wound Care: wound generally dry. I think she might be loosening it at times when she rubs on pillow, causing some of drainage 7. Fluids/Electrolytes/Nutrition: encourage PO     -BUN sl improved yesterday   -continue HS IVF--dc after tonight's run 8.  Seizure proph: keppra completed 9. L occipital calvarium Fx: Soft collar at all times with outpatient follow up w/Dr. PAnnette Stable -no ecchymosis or drainage  10. R- 2nd rib Fx: Encouraging IS 11. 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  -still having sensory sx in hands,  LE's to lesser extent, feels like sandpaper but not painful  12. CKD stage IV: Followed by Dr. BTheador HawthorneBaseline SCr- --increased BUN--added hs IVF--recheck labs Monday - Continue sodium bicarb 13. H/o UGIB/Iron deficiency anemia/HH: Add iron supplement. B12 reviewed and is stable.  14. TURBT 04/16/2022 for invasive high grade bladder CA: h/o dysuria/frequency --was referred for palliative XRT, Dr. MTammi Klippel  -marking  starts today followed by treatments -sessions after therapy complete- pt worried about her endurance    16. 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  17. Vitamin D deficiency: started ergocalciferol 50,000U once per week for 7 weeks 18. BPPV: changed meclizine to tid prn  -vestibular treatment limited d/t cervical issues  LOS: 10 days A FACE TO FACE EVALUATION WAS PERFORMED  Meredith Staggers 05/27/2022, 11:13 AM

## 2022-05-27 NOTE — Progress Notes (Signed)
Occupational Therapy Session Note  Patient Details  Name: Jane Andrews MRN: [id number] Date of Birth: 1925-10-23  Today's Date: 05/27/2022 OT Individual Time: 0850-1005 OT Individual Time Calculation (min): 75 min    Short Term Goals: Week 1:  OT Short Term Goal 1 (Week 1): Pt will transfer to [facility] with mod A consistently OT Short Term Goal 1 - Progress (Week 1): Met OT Short Term Goal 2 (Week 1): Pt will be able to tolerate sitting EOB to perform UB ADL with close supervision OT Short Term Goal 2 - Progress (Week 1): Met OT Short Term Goal 3 (Week 1): Pt  will thread underwear/ brief and pants with min A witout AE OT Short Term Goal 3 - Progress (Week 1): Progressing toward goal Week 2:  OT Short Term Goal 1 (Week 2): Pt will use built up handles as needed to complete grooming tasks with set up assist only OT Short Term Goal 2 (Week 2): Pt will complete a stand pivot transfer to the toilet with min A OT Short Term Goal 3 (Week 2): Pt will don pants with min A overall, AD as needed  Skilled Therapeutic Interventions/Progress Updates:     Patient seen this AM, pt in bed posiitoned in supine upon arrival, pt was receptive to performing a BADL related task in bathing at EOB.  The pt was able to come to the EOB with CGA inclusive of managing BLE.  The pt was able to maintain good sitting balance EOB occasionally including the bed rails for adjustments and balance.  The pt was able to remove her top with MinA for  managing the buttons, she is a candidate for AE botton hook to improve independence secondary to numbness of B hands.  The pt was able to come from sit to stand using the [facility], the pt was able to grab the bar and pull herself into a standing position with CGA.  The pt was able to maintain position for doffing her LB garments, she was MinA for doffing her pants and MaxA for the brief, the pt is incontinent of bowel and bladder and presented as dependent for cleaning herself and  doffing/donning her brief.  The pt was able to donn her pants with MinA and was able to complete a grooming task at the sink with s/u assist.  The pt transferred from the stedy to the recliner with [place].  The pt was seated in the recliner with her bedside table and call light within reach with alarm in place.  The pt hand no c/o pain this treatment session and all additional needs were addressed at the time of service delivery.  Therapy Documentation Precautions:  Precautions Precautions: Fall, Cervical Precaution Booklet Issued: No Precaution Comments: verbal review of neck precautions, will need reinforcement Required Braces or Orthoses: Cervical Brace Cervical Brace: Soft collar Restrictions Weight Bearing Restrictions: No Other Position/Activity Restrictions: 2nd rib fx  Therapy/Group: Individual Therapy  Yvonne Kendall 05/27/2022, 12:11 PM

## 2022-05-27 NOTE — Progress Notes (Signed)
Occupational Therapy TBI Note  Patient Details  Name: Jane Andrews MRN: [id number] Date of Birth: 09/16/1925  Today's Date: 05/27/2022 OT Individual Time: 1348-1434 OT Individual Time Calculation (min): 46 min    Short Term Goals: Week 1:  OT Short Term Goal 1 (Week 1): Pt will transfer to BSC with mod A consistently OT Short Term Goal 1 - Progress (Week 1): Met OT Short Term Goal 2 (Week 1): Pt will be able to tolerate sitting EOB to perform UB ADL with close supervision OT Short Term Goal 2 - Progress (Week 1): Met OT Short Term Goal 3 (Week 1): Pt  will thread underwear/ brief and pants with min A witout AE OT Short Term Goal 3 - Progress (Week 1): Progressing toward goal  Skilled Therapeutic Interventions/Progress Updates:  Pt greeted  supine in bed reporting pain in butt requesting to get OOB. Pt completed supine>sit with CGA, utilized stedy for transfer OOB d/t urgency with wanting to get OOB, CGA to stand to stedy and total A transport to recliner. From recliner worked on functional sit>stands with an emphasis on using BLE strength for sit>stand. Pt needed MIN A to power into standing with no UE support x3 reps. Pt completed ~ 20 ft of functional ambulation in room with RW and MINA. Pt continues to present with BLE ataxia and moments of scissoring during gait. Pt transported to day room in w/c with total A for time mgmt. Worked on BLE strength, coordination, dynamic standing balance via therapeutic activity below: - pt instructed to stand at railing with 1.5 lb ankle weights to provide proprioceptive input to BLEs. CGA for sit>stand at railing - OTA called out various colors with pt instructed to step on corresponding color and then return to "home base" ( wide BOS; with feet under hips).  - all steps required pt to step out of BOS and shift weight anteriorly or laterally to work on dynamic balance during functional gait and provide NMR to decrease BLE ataxia - pt completed 2  trials for ~ 3 mins each before reporting fatigue.  Pt transported back to room with total A where pt completed functional ambulation into bathroom with RW and MINA, pt completed 3/3 toileting tasks with overall MOD A needing assist to manage clothing but able to complete anterior hygiene from sitting. + urine void recorded in chart.pt ambulated back to recliner with Rw and MINA. Pt left up in recliner with alarm belt activated and all needs within reach.     Therapy Documentation Precautions:  Precautions Precautions: Fall, Cervical Precaution Booklet Issued: No Precaution Comments: verbal review of neck precautions, will need reinforcement Required Braces or Orthoses: Cervical Brace Cervical Brace: Soft collar Restrictions Weight Bearing Restrictions: No Other Position/Activity Restrictions: 2nd rib fx  Pain: no pain reported during session   Therapy/Group: Individual Therapy  Mary Kate Kanoy 05/27/2022, 2:52 PM 

## 2022-05-27 NOTE — Progress Notes (Signed)
Physical Therapy Session Note  Patient Details  Name: Jane Andrews MRN: [id number] Date of Birth: December 13, 1925  Today's Date: 05/27/2022 PT Individual Time: 8527-7824 PT Individual Time Calculation (min): 31 min   Short Term Goals: Week 2:  PT Short Term Goal 1 (Week 2): Patient will perform bed mobility with min A consistently. PT Short Term Goal 2 (Week 2): Patient will perform basic transfers with min A >50% of the time. PT Short Term Goal 3 (Week 2): Patient will initiate stair training. PT Short Term Goal 4 (Week 2): Patient will ambulate >50 ft with min A using LRAD.  Skilled Therapeutic Interventions/Progress Updates:     Patient in recliner upon PT arrival. Patient alert and agreeable to PT session. Patient denied pain during session.  Therapeutic Activity: Bed Mobility: Patient performed sit to supine with supervision. Provided verbal cues for log roll technique. Transfers: Patient performed sit to/from stand x3 with CGA-min A with RW and stand step transfer with HHA x1 with min-mod A. Provided verbal cues for hand placement, forward weight shift, pressing her toes into the floor to reduce posterior bias, and completing turns with controlled descent to sit safely.   Gait Training:  Patient ambulated 5 feet and 50 feet with 2x180 deg turns x2 using RW with min A with mod/max A x1 due to L LOB with L foot crossing over with scissoring. Ambulated with decreased gait speed, decreased step length and height R>L, narrow BOS, intermittent variable foot placement, >on turns, forward trunk lean, and downward head gaze. Provided verbal cues and facilitation for AD management, increased BOS with visual cues for stepping to the outside of the walker, and increased weight shift to improve foot clearance.  Patient in bed due to fatigue at end of session with breaks locked, bed alarm set, and all needs within reach. Located patient's preferred TV channel and provided patient with 2 water  bottles for improved fluid intake at end of session. LPN made aware.  Therapy Documentation Precautions:  Precautions Precautions: Fall, Cervical Precaution Booklet Issued: No Precaution Comments: verbal review of neck precautions, will need reinforcement Required Braces or Orthoses: Cervical Brace Cervical Brace: Soft collar Restrictions Weight Bearing Restrictions: No Other Position/Activity Restrictions: 2nd rib fx    Therapy/Group: Individual Therapy  Daja Shuping L Veleda Mun PT, DPT  05/27/2022, 12:05 PM

## 2022-05-27 NOTE — Plan of Care (Signed)
Several goals downgraded to [place] level for increased safety at home.   Problem: RH Balance Goal: LTG Patient will maintain dynamic standing with ADLs (OT) Description: LTG:  Patient will maintain dynamic standing balance with assist during activities of daily living (OT)  Flowsheets (Taken 05/27/2022 0907) LTG: Pt will maintain dynamic standing balance during ADLs with: (downgraded 6/13- SD) Contact Guard/Touching assist   Problem: RH Bathing Goal: LTG Patient will bathe all body parts with assist levels (OT) Description: LTG: Patient will bathe all body parts with assist levels (OT) Flowsheets (Taken 05/27/2022 0907) LTG: Pt will perform bathing with assistance level/cueing: (downgraded 6/13- SD) Contact Guard/Touching assist   Problem: RH Dressing Goal: LTG Patient will perform lower body dressing w/assist (OT) Description: LTG: Patient will perform lower body dressing with assist, with/without cues in positioning using equipment (OT) Flowsheets (Taken 05/27/2022 0907) LTG: Pt will perform lower body dressing with assistance level of: (downgraded 6/13- SD) Contact Guard/Touching assist   Problem: RH Toileting Goal: LTG Patient will perform toileting task (3/3 steps) with assistance level (OT) Description: LTG: Patient will perform toileting task (3/3 steps) with assistance level (OT)  Flowsheets (Taken 05/27/2022 0907) LTG: Pt will perform toileting task (3/3 steps) with assistance level: (downgraded 6/13- SD) Minimal Assistance - Patient > 75%   Problem: RH Toilet Transfers Goal: LTG Patient will perform toilet transfers w/assist (OT) Description: LTG: Patient will perform toilet transfers with assist, with/without cues using equipment (OT) Flowsheets (Taken 05/27/2022 0907) LTG: Pt will perform toilet transfers with assistance level of: (downgraded 6/13- SD) Contact Guard/Touching assist

## 2022-05-27 NOTE — Progress Notes (Addendum)
Occupational Therapy Weekly Progress Note  Patient Details  Name: CHRISTALYN GOERTZ MRN: [id number] Date of Birth: 1925-03-14  Beginning of progress report period: May 18, 2022 End of progress report period: May 27, 2022  Patient has met 2 of 3 short term goals.  Dot has made great progress her first week in CIR. She has progressed to a min A level with UB ADLs, mod A with LB, and is becoming more consistent with min A transfers with the RW. Her grasp is improving bilaterally as well as overall strengthening and functional activity tolerance. Her BLE ataxia still limits her safety with ADL transfers. Family education will be completed closer to d/c with her niece.   Patient continues to demonstrate the following deficits: muscle weakness, ataxia, delayed processing, and decreased standing balance, decreased postural control, and decreased balance strategies and therefore will continue to benefit from skilled OT intervention to enhance overall performance with BADL.  Patient progressing toward long term goals., however several goals downgraded to CGA and min A for toileting to ensure pt safety at home. Continue plan of care.  OT Short Term Goals Week 1:  OT Short Term Goal 1 (Week 1): Pt will transfer to [facility] with mod A consistently OT Short Term Goal 1 - Progress (Week 1): Met OT Short Term Goal 2 (Week 1): Pt will be able to tolerate sitting EOB to perform UB ADL with close supervision OT Short Term Goal 2 - Progress (Week 1): Met OT Short Term Goal 3 (Week 1): Pt  will thread underwear/ brief and pants with min A witout AE OT Short Term Goal 3 - Progress (Week 1): Progressing toward goal Week 2:  OT Short Term Goal 1 (Week 2): Pt will use built up handles as needed to complete grooming tasks with set up assist only OT Short Term Goal 2 (Week 2): Pt will complete a stand pivot transfer to the toilet with min A OT Short Term Goal 3 (Week 2): Pt will don pants with min A overall, AD as  needed   Curtis Sites 05/27/2022, 9:01 AM

## 2022-05-27 NOTE — Progress Notes (Signed)
Patient ID: Jane Andrews, female   DOB: 03/22/1925, 88 y.o.   MRN: [id number]  SW confirmed with [facility] pt remains on schedule for pick up at 3:30pm for the remainder of the week. Packet left a front desk.   SW met with pt in room to provide updates from team conference, and d/c date remains 6/23. Pt aware SW will follow-up with her niece [name].   1649- SW left message for pt niece [name] to provide updates from team conference, and discuss scheduling family education.   Loralee Pacas, MSW, [place] Office: [phone] Cell: [phone] Fax: [phone]

## 2022-05-28 ENCOUNTER — Ambulatory Visit
Admit: 2022-05-28 | Discharge: 2022-05-28 | Disposition: A | Discharge status: Home / Self Care | Payer: Medicare Other | Attending: Radiation Oncology | Admitting: Radiation Oncology

## 2022-05-28 ENCOUNTER — Other Ambulatory Visit: Payer: Self-pay

## 2022-05-28 DIAGNOSIS — Z510 Encounter for antineoplastic radiation therapy: Secondary | ICD-10-CM | POA: Diagnosis not present

## 2022-05-28 DIAGNOSIS — C670 Malignant neoplasm of trigone of bladder: Secondary | ICD-10-CM | POA: Diagnosis not present

## 2022-05-28 LAB — RAD ONC ARIA SESSION SUMMARY
Course Elapsed Days: 1
Plan Fractions Treated to Date: 2
Plan Prescribed Dose Per Fraction: 3.5 Gy
Plan Total Fractions Prescribed: 8
Plan Total Prescribed Dose: 28 Gy
Reference Point Dosage Given to Date: 7 Gy
Reference Point Session Dosage Given: 3.5 Gy
Session Number: 2

## 2022-05-28 NOTE — Progress Notes (Signed)
Physical Therapy Session Note  Patient Details  Name: Jane Andrews MRN: [id number] Date of Birth: Nov 20, 1925  Today's Date: 05/28/2022 PT Individual Time: 1350-1435 PT Individual Time Calculation (min): 45 min   Short Term Goals: Week 2:  PT Short Term Goal 1 (Week 2): Patient will perform bed mobility with min A consistently. PT Short Term Goal 2 (Week 2): Patient will perform basic transfers with min A >50% of the time. PT Short Term Goal 3 (Week 2): Patient will initiate stair training. PT Short Term Goal 4 (Week 2): Patient will ambulate >50 ft with min A using LRAD.  Skilled Therapeutic Interventions/Progress Updates:     Patient in bed with soft collar donned upon PT arrival. Patient alert and agreeable to PT session. Patient denied pain during session.  Patient continues to express concerns about loss of functional independence. Provided therapeutic listening and reinforcement of coping strategies and encouragement throughout session. Patient very pleased with her progress during session today at end of session.   Therapeutic Activity: Bed Mobility: Patient performed supine to/from sit with min A for trunk and lower extremity management with use of hospital bed features. Provided verbal cues for log roll technique. Donned B tennis shoes with total A for energy/time management. B thigh high TEDs donned prior and during session.  Transfers: Patient performed stand pivot bed<>w/c with mod A and +2 assist on first trial due to L LOB and min A of 1 person on second trial with improved foot placement while stepping with decreased speed and increased verbal cues. She performed  sit to/from stand x2 using B hand rails and x1 with RW with CGA-min A. Provided verbal cues for forward weight shift.  Gait Training:  Patient ambulated 132 feet using RW with min A with +2 w/c follow for safety. Provided multimodal cues for increased BOS L>R, controlled/intentional foot placement, AD  management with R veer x2, and erect posture.  Patient ascended/descended 8x3" and 1x6" steps using B rails with min A of 1-2 for safety and increased patient confidence with activity. Performed step-to gait pattern leading with R while ascending and L while descending. Provided cues for foot placement to reduced scissoring and increased forward weight shift on descent due to posterior bias.   Patient in bed at end of session with breaks locked, bed alarm set, and all needs within reach. Had patient demonstrate use of modified TV control buttons, done by OT during session, to allow patient to select the channels independently. Patient able to use modified buttons effectively.   Therapy Documentation Precautions:  Precautions Precautions: Fall, Cervical Precaution Booklet Issued: No Precaution Comments: verbal review of neck precautions, will need reinforcement Required Braces or Orthoses: Cervical Brace Cervical Brace: Soft collar Restrictions Weight Bearing Restrictions: No Other Position/Activity Restrictions: 2nd rib fx    Therapy/Group: Individual Therapy  Vernesha Talbot L Bascom Biel PT, DPT  05/28/2022, 3:48 PM

## 2022-05-28 NOTE — Progress Notes (Signed)
Occupational Therapy Session Note  Patient Details  Name: Jane Andrews MRN: [id number] Date of Birth: Dec 11, 1925  Today's Date: 05/28/2022 OT Individual Time: 1100-1200 OT Individual Time Calculation (min): 60 min    Short Term Goals: Week 1:  OT Short Term Goal 1 (Week 1): Pt will transfer to [facility] with mod A consistently OT Short Term Goal 1 - Progress (Week 1): Met OT Short Term Goal 2 (Week 1): Pt will be able to tolerate sitting EOB to perform UB ADL with close supervision OT Short Term Goal 2 - Progress (Week 1): Met OT Short Term Goal 3 (Week 1): Pt  will thread underwear/ brief and pants with min A witout AE OT Short Term Goal 3 - Progress (Week 1): Progressing toward goal Week 2:  OT Short Term Goal 1 (Week 2): Pt will use built up handles as needed to complete grooming tasks with set up assist only OT Short Term Goal 2 (Week 2): Pt will complete a stand pivot transfer to the toilet with min A OT Short Term Goal 3 (Week 2): Pt will don pants with min A overall, AD as needed  Skilled Therapeutic Interventions/Progress Updates:     Patient in bed upon arrival, pt able to come from supine to EOB with MinA for transitioning to EOB , pt indicated that she was experienceing dizziness secondary to change in position. The pt presented with a BP of 115/61 and a 02 saturation of 100% on room air. The pt was able to transfer from EOB to the stedy for toileting with MinA , the pt was MaxA for doffing her LB garments, a brief and pajama bottoms. she presents with a strong preference for leaning to the left during sit to stands, requiring verbal prompts for adjusting her balance.  The pt was able to complete toileting with MaxA for task performance involving wiping and clothing management.  The pt returned to her living quarter and was instructed in UB theraex using a 1lb dowel in various planes 1 set of 10 with rest breaks as needed ,the pt required 2 rest break .The pt was returned to bed LOF  using the stedy with  MinA, her bedside table and call light were within reach and her alarm was activated.  The pt had no c/o pain this treatment session. Therapy Documentation Precautions:  Precautions Precautions: Fall, Cervical Precaution Booklet Issued: No Precaution Comments: verbal review of neck precautions, will need reinforcement Required Braces or Orthoses: Cervical Brace Cervical Brace: Soft collar Restrictions Weight Bearing Restrictions: No Other Position/Activity Restrictions: 2nd rib fx Therapy/Group: Individual Therapy  Yvonne Kendall 05/28/2022, 12:32 PM

## 2022-05-28 NOTE — Progress Notes (Signed)
PROGRESS NOTE   Subjective/Complaints: Made it through yesterday without any major issues. Was a little tired by end of day  ROS: Patient denies fever, rash, sore throat, blurred vision, dizziness, nausea, vomiting, diarrhea, cough, shortness of breath or chest pain,  headache, or mood change.   Objective:     No results found. Recent Labs    05/26/22 0528  WBC 7.2  HGB 9.5*  HCT 29.6*  PLT 179    Recent Labs    05/26/22 0528  NA 136  K 3.7  CL 108  CO2 18*  GLUCOSE 91  BUN 36*  CREATININE 1.53*  CALCIUM 8.5*     Intake/Output Summary (Last 24 hours) at 05/28/2022 0825 Last data filed at 05/27/2022 1806 Gross per 24 hour  Intake 480 ml  Output 300 ml  Net 180 ml        Physical Exam: Vital Signs Blood pressure 136/60, pulse 78, temperature 98 F (36.7 C), resp. rate 18, height '5\' 1"'$  (1.549 m), weight 53.8 kg, SpO2 98 %.  Constitutional: No distress . Vital signs reviewed. HEENT: NCAT, EOMI, oral membranes moist Neck: soft collar Cardiovascular: RRR without murmur. No JVD    Respiratory/Chest: CTA Bilaterally without wheezes or rales. Normal effort    GI/Abdomen: BS +, non-tender, non-distended Ext: no clubbing, cyanosis, or edema Psych: pleasant and cooperative, in good spirits  Skin: occipital wound clean/dry. Area remains sens to palpation Neuro: Alert and oriented x3. Decreased sensation in bilateral hands and ulnar side of wrist. Proprioceptive deficits in legs too.  4/5 strength in upper extremities, 5/5 strength in lower extremities.  Motor exam stable.  Musculoskeletal: no pain with limb rom.   Assessment/Plan: 1. Functional deficits which require 3+ hours per day of interdisciplinary therapy in a comprehensive inpatient rehab setting. Physiatrist is providing close team supervision and 24 hour management of active medical problems listed below. Physiatrist and rehab team continue to assess  barriers to discharge/monitor patient progress toward functional and medical goals  Care Tool:  Bathing    Body parts bathed by patient: Right arm, Left arm, Chest, Abdomen, Right upper leg, Left upper leg, Face   Body parts bathed by helper: Front perineal area, Buttocks, Right lower leg, Left lower leg     Bathing assist Assist Level: Moderate Assistance - Patient 50 - 74%     Upper Body Dressing/Undressing Upper body dressing   What is the patient wearing?: Button up shirt    Upper body assist Assist Level: Moderate Assistance - Patient 50 - 74%    Lower Body Dressing/Undressing Lower body dressing      What is the patient wearing?: Incontinence brief, Pants     Lower body assist Assist for lower body dressing: Maximal Assistance - Patient 25 - 49%     Toileting Toileting    Toileting assist Assist for toileting: Moderate Assistance - Patient 50 - 74%     Transfers Chair/bed transfer  Transfers assist     Chair/bed transfer assist level: Moderate Assistance - Patient 50 - 74% Chair/bed transfer assistive device: Armrests, Programmer, multimedia   Ambulation assist      Assist level: Moderate Assistance -  Patient 50 - 74% Assistive device: Walker-rolling Max distance: 56f   Walk 10 feet activity   Assist  Walk 10 feet activity did not occur: Safety/medical concerns        Walk 50 feet activity   Assist Walk 50 feet with 2 turns activity did not occur: Safety/medical concerns         Walk 150 feet activity   Assist Walk 150 feet activity did not occur: Safety/medical concerns         Walk 10 feet on uneven surface  activity   Assist Walk 10 feet on uneven surfaces activity did not occur: Safety/medical concerns         Wheelchair     Assist Is the patient using a wheelchair?: Yes Type of Wheelchair: Manual    Wheelchair assist level: Dependent - Patient 0%      Wheelchair 50 feet with 2 turns  activity    Assist        Assist Level: Dependent - Patient 0%   Wheelchair 150 feet activity     Assist      Assist Level: Dependent - Patient 0%   Blood pressure 136/60, pulse 78, temperature 98 F (36.7 C), resp. rate 18, height '5\' 1"'$  (1.549 m), weight 53.8 kg, SpO2 98 %.  . Medical Problem List and Plan: 1. Functional deficits secondary to Central cord syndrome as well as TBI (SAH/SDH)             -patient may shower, ordered [place] collar for shower             -ELOS/Goals: 25 days           -Continue CIR therapies including PT, OT, and SLP. Interdisciplinary team conference today to discuss goals, barriers to discharge, and dc planning.   2.  Antithrombotics: -DVT/anticoagulation:  Pharmaceutical: Lovenox started pm 06/02              -antiplatelet therapy: N/a due to bleed.  3. Pain: continue Tylenol QID, Cymbalta '90mg'$  daily, gabapentin, tramadol 4. Mood/sleep: pt to provide ego support -pt is already on cymbalta and xanax as pta for anxiety disorder   -adjusted xanax to '1mg'$  qhs she uses at home which has helped sleep  - free T4 level ok, acutally a bit high - neuropsych requested -team/family continues to provide positive reinforcement and support -               -antipsychotic agents: N/A 5. Neuropsych: This patient is capable of making decisions on her own behalf. 6. Skin/Wound Care: wound generally dry. I think she might be loosening it at times when she rubs on pillow, causing some of drainage 7. Fluids/Electrolytes/Nutrition: encourage PO     -BUN sl improved     -have dc'ed IVF. Re-check tomorrow 8.  Seizure proph: keppra completed 9. L occipital calvarium Fx: Soft collar at all times with outpatient follow up w/Dr. PAnnette Stable -no ecchymosis or drainage  10. R- 2nd rib Fx: Encouraging IS 11. 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  -still having sensory sx in hands, LE's to lesser extent,no  change 12. CKD stage IV: Followed by Dr. BTheador HawthorneBaseline SCr- --increased BUN--added hs IVF--recheck labs Monday - Continue sodium bicarb 13. H/o UGIB/Iron deficiency anemia/HH: Add iron supplement. B12 reviewed and is stable.  14. TURBT 04/16/2022 for invasive high grade bladder CA: h[facility]o dysuria/frequency [facility] -sessions after therapy complete- pt worried about her endurance  16. 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  17. Vitamin D deficiency: started ergocalciferol 50,000U once per week for 7 weeks 18. BPPV: changed meclizine to tid prn  -vestibular treatment limited d/t cervical issues  LOS: 11 days A FACE TO FACE EVALUATION WAS PERFORMED  Meredith Staggers 05/28/2022, 8:25 AM

## 2022-05-28 NOTE — Progress Notes (Signed)
Speech Language Pathology Daily Session Note  Patient Details  Name: Jane Andrews MRN: [id number] Date of Birth: 02-Feb-1925  Today's Date: 05/28/2022 SLP Individual Time: 0370-4888 SLP Individual Time Calculation (min): 58 min  Short Term Goals: Week 2: SLP Short Term Goal 1 (Week 2): Patient will demonstrate functional problem solving with mildly complex tasks with Min verbal cues. SLP Short Term Goal 2 (Week 2): Patient will recall at least 3 pieces of functional information pertaining to her daily events with use of external and internal aids (WRAP strategies) with Min A verbal cues. SLP Short Term Goal 3 (Week 2): Patient will self-monitor and correct errors during functional tasks with Min verbal cues. SLP Short Term Goal 4 (Week 2): Patient will demonstrate sustained attention to functional tasks for 30 minutes with Min verbal cues for redirection.  Skilled Therapeutic Interventions: Skilled treatment session focused on cognitive goals. Upon arrival, patient was awake in the wheelchair. SLP facilitated session by providing Mod verbal cues for recall of her current medication and their functions. SLP also provided multiple modifications with extra time for patient to organize a QD pill box. Modifications made/recommended were "pop top" lids, a no skid placemat to place underneath the pill box, a plate or bowl to empty pills in for ease of pick up and a pill box that has easy open tabs that you have to push down to open. Patient verbalized understanding. Patient transferred back to bed via the Stedy at end of session per her request with Min verbal cues needed for problem solving. Patient left upright in bed with alarm on and all needs within reach. Continue with current plan of care.      Pain No/Denies Pain   Therapy/Group: Individual Therapy  Nelton Amsden 05/28/2022, 1:13 PM

## 2022-05-28 NOTE — Progress Notes (Signed)
Physical Therapy Session Note  Patient Details  Name: Jane Andrews MRN: [id number] Date of Birth: September 30, 1925  Today's Date: 05/28/2022 PT Individual Time: 9937-1696 PT Individual Time Calculation (min): 32 min   Short Term Goals: Week 2:  PT Short Term Goal 1 (Week 2): Patient will perform bed mobility with min A consistently. PT Short Term Goal 2 (Week 2): Patient will perform basic transfers with min A >50% of the time. PT Short Term Goal 3 (Week 2): Patient will initiate stair training. PT Short Term Goal 4 (Week 2): Patient will ambulate >50 ft with min A using LRAD.  Skilled Therapeutic Interventions/Progress Updates: Pt presented in bed completing breakfast and agreeable to therapy. Pt denies pain at rest. Once breakfast completed PTA donned thigh high TED hose total A and threaded pants total A. Pt performed supine to sit with minA and use of bed features. Pt with orthostatic symptoms upon sitting EOB with abdominal placed upon sitting and pt stating symptoms subsiding within 2 min. During this time pt stating that she has had a leg length discrepancy PTA and at home some shoes have heel lift. Pt was able to stand with minA and PTA pulled pants over hips with pt performed stand pivot transfer to w/c with minA. Pt transported to rehab gym and PTA placed heel wedge in L shoe. Pt ambulated ~60f with minA on straight path and near modA during turns due to decreased coordination/mild ataxia. Pt did idicate may have felt a little better with heel lift placed. Adv to continue to monitor during latter sessions and that this therapist will notify primary therapist. Pt then handed off to SLP for following session.      Therapy Documentation Precautions:  Precautions Precautions: Fall, Cervical Precaution Booklet Issued: No Precaution Comments: verbal review of neck precautions, will need reinforcement Required Braces or Orthoses: Cervical Brace Cervical Brace: Soft  collar Restrictions Weight Bearing Restrictions: No Other Position/Activity Restrictions: 2nd rib fx General:   Vital Signs: Therapy Vitals Temp: 98.7 F (37.1 C) Pulse Rate: 78 Resp: 18 BP: (!) 118/59 Patient Position (if appropriate): Lying Oxygen Therapy SpO2: 99 % O2 Device: Room Air Pain:   Mobility:   Locomotion :    Trunk/Postural Assessment :    Balance:   Exercises:   Other Treatments:      Therapy/Group: Individual Therapy  [name] 05/28/2022, 4:30 PM

## 2022-05-29 ENCOUNTER — Ambulatory Visit: Payer: Medicare Other

## 2022-05-29 ENCOUNTER — Ambulatory Visit
Admit: 2022-05-29 | Discharge: 2022-05-29 | Disposition: A | Discharge status: Home / Self Care | Payer: Medicare Other | Attending: Radiation Oncology | Admitting: Radiation Oncology

## 2022-05-29 ENCOUNTER — Other Ambulatory Visit: Payer: Self-pay

## 2022-05-29 DIAGNOSIS — Z510 Encounter for antineoplastic radiation therapy: Secondary | ICD-10-CM | POA: Diagnosis not present

## 2022-05-29 DIAGNOSIS — C670 Malignant neoplasm of trigone of bladder: Secondary | ICD-10-CM | POA: Diagnosis not present

## 2022-05-29 LAB — RAD ONC ARIA SESSION SUMMARY
Course Elapsed Days: 2
Plan Fractions Treated to Date: 3
Plan Prescribed Dose Per Fraction: 3.5 Gy
Plan Total Fractions Prescribed: 8
Plan Total Prescribed Dose: 28 Gy
Reference Point Dosage Given to Date: 10.5 Gy
Reference Point Session Dosage Given: 3.5 Gy
Session Number: 3

## 2022-05-29 LAB — CBC
HCT: 29.2 % — ABNORMAL LOW (ref 36.0–46.0)
Hemoglobin: 9.6 g/dL — ABNORMAL LOW (ref 12.0–15.0)
MCH: 29.7 pg (ref 26.0–34.0)
MCHC: 32.9 g/dL (ref 30.0–36.0)
MCV: 90.4 fL (ref 80.0–100.0)
Platelets: 195 10*3/uL (ref 150–400)
RBC: 3.23 MIL/uL — ABNORMAL LOW (ref 3.87–5.11)
RDW: 13.9 % (ref 11.5–15.5)
WBC: 8.7 10*3/uL (ref 4.0–10.5)
nRBC: 0 % (ref 0.0–0.2)

## 2022-05-29 NOTE — Progress Notes (Signed)
Occupational Therapy TBI Note  Patient Details  Name: Jane Andrews MRN: [id number] Date of Birth: 30-Aug-1925  Today's Date: 05/29/2022 OT Individual Time: 5625-6389 OT Individual Time Calculation (min): 56 min    Short Term Goals: Week 1:  OT Short Term Goal 1 (Week 1): Pt will transfer to [facility] with mod A consistently OT Short Term Goal 1 - Progress (Week 1): Met OT Short Term Goal 2 (Week 1): Pt will be able to tolerate sitting EOB to perform UB ADL with close supervision OT Short Term Goal 2 - Progress (Week 1): Met OT Short Term Goal 3 (Week 1): Pt  will thread underwear/ brief and pants with min A witout AE OT Short Term Goal 3 - Progress (Week 1): Progressing toward goal Week 2:  OT Short Term Goal 1 (Week 2): Pt will use built up handles as needed to complete grooming tasks with set up assist only OT Short Term Goal 2 (Week 2): Pt will complete a stand pivot transfer to the toilet with min A OT Short Term Goal 3 (Week 2): Pt will don pants with min A overall, AD as needed  Skilled Therapeutic Interventions/Progress Updates:    Pt received semi-reclined in bed, agreeable to therapy. Session focus on activity tolerance, BUE/BLE strengthening, transfer/balance retraining in prep for improved ADL/IADL/func mobility performance + decreased caregiver burden. Total A to don B shoes. Thigh high teds, soft collar, and abdominal binder already donned. Came to sitting EOB with close S and use of bed rail.  Stand-pivot and short ambulatory transfers throughout session with RW and min A due to LLE ataxia.   Pt participated in three rounds of corn hole, able to reach across midline and complete side stepping to reach bags with overall min A for balance. Seated rest breaks due to fatigue in between rounds. Able to accurately keep track of points with min A.   Completed 3x2 min on Nustep to target BUE/BLE strengthening, LLE NMR, and generalized activity tolerance. 2/10 resistance with average  step length of 59 steps/min. Pt reports increase in R chronic knee pain and fatigue, activity ceased.   Short ambulatory transfer back to bed, total A to doff shoes. Returned to supine and boosted up in bed with min A.   Pt left semi-reclined in bed with bed alarm engaged, call bell in reach, and all immediate needs met.    Therapy Documentation Precautions:  Precautions Precautions: Fall, Cervical Precaution Booklet Issued: No Precaution Comments: verbal review of neck precautions, will need reinforcement Required Braces or Orthoses: Cervical Brace Cervical Brace: Soft collar Restrictions Weight Bearing Restrictions: No Other Position/Activity Restrictions: 2nd rib fx  Pain: see session note     ADL: See Care Tool for more details.    Therapy/Group: Individual Therapy  Volanda Napoleon MS, OTR/L   05/29/2022, 6:59 AM

## 2022-05-29 NOTE — Progress Notes (Signed)
Physical Therapy Session Note  Patient Details  Name: Jane Andrews MRN: [id number] Date of Birth: 1925-09-21  Today's Date: 05/29/2022 PT Individual Time: 1135-1205 PT Individual Time Calculation (min): 30 min   Short Term Goals: Week 2:  PT Short Term Goal 1 (Week 2): Patient will perform bed mobility with min A consistently. PT Short Term Goal 2 (Week 2): Patient will perform basic transfers with min A >50% of the time. PT Short Term Goal 3 (Week 2): Patient will initiate stair training. PT Short Term Goal 4 (Week 2): Patient will ambulate >50 ft with min A using LRAD.  Skilled Therapeutic Interventions/Progress Updates:     Patient in w/c in the room upon PT arrival. Patient alert and agreeable to PT session. Patient denied pain during session.  Transfers: Patient performed sit to/from stand x3 with RW with CGA-min A. Provided verbal cues for forward weight shift, spent increased time educating and cueing for sequencing of controlled descent for safety due to patient sitting impulsively and prematurely.   Gait Training:  Patient ambulated 50 feet and 100 feet using RW with min A with +2 w/c follow for safety. Provided multimodal cues for increased BOS L>R, controlled/intentional foot placement, AD management with R veer x2, and erect posture.  Patient ascended/descended 4x6" steps using B rails with min A and increased patient confidence with activity. Performed step-to gait pattern leading with R while ascending and L while descending. Provided demonstration and cues for foot placement to reduced scissoring and increased forward weight shift on descent due to posterior bias.   Patient in w/c in the room set-up for lunch at end of session with breaks locked, seat belt alarm set, and all needs within reach.   Therapy Documentation Precautions:  Precautions Precautions: Fall, Cervical Precaution Booklet Issued: No Precaution Comments: verbal review of neck precautions, will  need reinforcement Required Braces or Orthoses: Cervical Brace Cervical Brace: Soft collar Restrictions Weight Bearing Restrictions: No Other Position/Activity Restrictions: 2nd rib fx    Therapy/Group: Individual Therapy  Jevin Camino L Christropher Gintz PT, DPT  05/29/2022, 12:23 PM

## 2022-05-29 NOTE — Progress Notes (Signed)
Occupational Therapy Session Note  Patient Details  Name: Jane Andrews MRN: [id number] Date of Birth: 01/08/1925  Today's Date: 05/29/2022 OT Individual Time: 1030-1100 OT Individual Time Calculation (min): 30 min    Short Term Goals: Week 2:  OT Short Term Goal 1 (Week 2): Pt will use built up handles as needed to complete grooming tasks with set up assist only OT Short Term Goal 2 (Week 2): Pt will complete a stand pivot transfer to the toilet with min A OT Short Term Goal 3 (Week 2): Pt will don pants with min A overall, AD as needed  Skilled Therapeutic Interventions/Progress Updates:    S: Pt reports that both her hands and her feet are numb.  O: - Fine motor coordination task completed: card flip using right hand focusing on accuracy of picking up on card at a time prior to flipping into discard pile.  - Theraputty: Located 12/12 small beads; squeeze/release;10X each hand. Lateral pinch and 3 point pinch: 5X each hand.   A: Session focused on fine motor coordination of patient's dominant extremity and bilateral hand strength (grip and pinch) in order to increase functional performance when performing self care tasks and fine motor tasks such as manipulating small objects. VC for form and technique were provided. Pt completed each task with min-mod difficulty.   P; Continue with POC while increasing functional performance during self care tasks.   Therapy Documentation Precautions:  Precautions Precautions: Fall, Cervical Precaution Booklet Issued: No Precaution Comments: verbal review of neck precautions, will need reinforcement Required Braces or Orthoses: Cervical Brace Cervical Brace: Soft collar Restrictions Weight Bearing Restrictions: No Other Position/Activity Restrictions: 2nd rib fx  Pain:   0/10 pain reported   Therapy/Group: Individual Therapy  Ailene Ravel, OTR/L,CBIS  Supplemental OT - [facility] and [facility]  05/29/2022, 3:40 PM

## 2022-05-29 NOTE — Progress Notes (Signed)
Physical Therapy Session Note  Patient Details  Name: Jane Andrews MRN: [id number] Date of Birth: 1925-07-09  Today's Date: 05/29/2022 PT Individual Time: 9379-0240 PT Individual Time Calculation (min): 40 min   Short Term Goals: Week 1:  PT Short Term Goal 1 (Week 1): Patient will perform basic transfers with equal to or better than mod A consistently. PT Short Term Goal 1 - Progress (Week 1): Met PT Short Term Goal 2 (Week 1): Patient will ambulate >50 ft using LRAD with mod A. PT Short Term Goal 2 - Progress (Week 1): Met PT Short Term Goal 3 (Week 1): Patient will initiate stair training. PT Short Term Goal 3 - Progress (Week 1): Progressing toward goal Week 2:  PT Short Term Goal 1 (Week 2): Patient will perform bed mobility with min A consistently. PT Short Term Goal 2 (Week 2): Patient will perform basic transfers with min A >50% of the time. PT Short Term Goal 3 (Week 2): Patient will initiate stair training. PT Short Term Goal 4 (Week 2): Patient will ambulate >50 ft with min A using LRAD.   Skilled Therapeutic Interventions/Progress Updates:   Pt received supine in bed, requesting to finish breakfast. PT returned and pt agreeable to PT.  PT donned thigh high ted hose while supine with total A. Semireclined to sit transfer with min assist from PT to rotate pelvis to allow BLE to be supported on floor. Donning pants and shoes EOB with total A for shoes and max assist for pants. Min assist from PT for balance and sit<>stand transfer while pulling pants to waist. Pt performed stand pivot transfer with min assist and cues for step width on the LLE. Pt performed WC propulsion through hall x 157f to day room with min assist progressing to supervision assist with cues for safety in turns and obstacle navigation in hall. Pt then reports need for BM. [facility] min assist and RW. Supervision assist for sitting balance sitting  on BSC while completing BM. Sit<>stand for hygiene completed with min assist and total A for pericare performed by PT. [facility] assist from PT with cues for posture and step width. Pt left sitting in WC with call bell in reach and chair alarm in place.         Therapy Documentation Precautions:  Precautions Precautions: Fall, Cervical Precaution Booklet Issued: No Precaution Comments: verbal review of neck precautions, will need reinforcement Required Braces or Orthoses: Cervical Brace Cervical Brace: Soft collar Restrictions Weight Bearing Restrictions: No Other Position/Activity Restrictions: 2nd rib fx General: PT Amount of Missed Time (min): 35 Minutes PT Missed Treatment Reason: Other (Comment) (eating)    Pain: Pain Assessment Pain Scale: 0-10 Pain Score: 0-No pain    Therapy/Group: Individual Therapy  ALorie Phenix6/15/2023, 10:57 AM

## 2022-05-29 NOTE — Progress Notes (Signed)
PROGRESS NOTE   Subjective/Complaints: Occipital scalp still sensitive. Bothers her most when sleeping/on pillow. Otherwise feeling reasonably well  ROS: Patient denies fever, rash, sore throat, blurred vision, dizziness, nausea, vomiting, diarrhea, cough, shortness of breath or chest pain, joint or back/neck pain, headache, or mood change.   Objective:     No results found. Recent Labs    05/29/22 0540  WBC 8.7  HGB 9.6*  HCT 29.2*  PLT 195    No results for input(s): "NA", "K", "CL", "CO2", "GLUCOSE", "BUN", "CREATININE", "CALCIUM" in the last 72 hours.    Intake/Output Summary (Last 24 hours) at 05/29/2022 0925 Last data filed at 05/28/2022 1958 Gross per 24 hour  Intake 560 ml  Output --  Net 560 ml        Physical Exam: Vital Signs Blood pressure 119/68, pulse 75, temperature 97.7 F (36.5 C), resp. rate 16, height '5\' 1"'$  (1.549 m), weight 53.8 kg, SpO2 100 %.  Constitutional: No distress . Vital signs reviewed. HEENT: NCAT, EOMI, oral membranes moist Neck: supple Cardiovascular: RRR without murmur. No JVD    Respiratory/Chest: CTA Bilaterally without wheezes or rales. Normal effort    GI/Abdomen: BS +, non-tender, non-distended Ext: no clubbing, cyanosis, or edema Psych: pleasant and cooperative  Skin: occipital area healing, dry. Sensitive to palpation Neuro: Alert and oriented x3. Decreased sensation in bilateral hands and ulnar side of wrist. Proprioceptive deficits in legs too.  4/5 strength in upper extremities, 5/5 strength in lower extremities.  Motor exam stable.  Musculoskeletal: no pain with limb rom.   Assessment/Plan: 1. Functional deficits which require 3+ hours per day of interdisciplinary therapy in a comprehensive inpatient rehab setting. Physiatrist is providing close team supervision and 24 hour management of active medical problems listed below. Physiatrist and rehab team continue to  assess barriers to discharge/monitor patient progress toward functional and medical goals  Care Tool:  Bathing    Body parts bathed by patient: Right arm, Left arm, Chest, Abdomen, Right upper leg, Left upper leg, Face   Body parts bathed by helper: Front perineal area, Buttocks, Right lower leg, Left lower leg     Bathing assist Assist Level: Moderate Assistance - Patient 50 - 74%     Upper Body Dressing/Undressing Upper body dressing   What is the patient wearing?: Button up shirt    Upper body assist Assist Level: Moderate Assistance - Patient 50 - 74%    Lower Body Dressing/Undressing Lower body dressing      What is the patient wearing?: Incontinence brief, Pants     Lower body assist Assist for lower body dressing: Maximal Assistance - Patient 25 - 49%     Toileting Toileting    Toileting assist Assist for toileting: Moderate Assistance - Patient 50 - 74%     Transfers Chair/bed transfer  Transfers assist     Chair/bed transfer assist level: Moderate Assistance - Patient 50 - 74% Chair/bed transfer assistive device: Armrests, Programmer, multimedia   Ambulation assist      Assist level: Moderate Assistance - Patient 50 - 74% Assistive device: Walker-rolling Max distance: 67f   Walk 10 feet activity   Assist  Walk 10 feet activity did not occur: Safety/medical concerns        Walk 50 feet activity   Assist Walk 50 feet with 2 turns activity did not occur: Safety/medical concerns         Walk 150 feet activity   Assist Walk 150 feet activity did not occur: Safety/medical concerns         Walk 10 feet on uneven surface  activity   Assist Walk 10 feet on uneven surfaces activity did not occur: Safety/medical concerns         Wheelchair     Assist Is the patient using a wheelchair?: Yes Type of Wheelchair: Manual    Wheelchair assist level: Dependent - Patient 0%      Wheelchair 50 feet with 2 turns  activity    Assist        Assist Level: Dependent - Patient 0%   Wheelchair 150 feet activity     Assist      Assist Level: Dependent - Patient 0%   Blood pressure 119/68, pulse 75, temperature 97.7 F (36.5 C), resp. rate 16, height '5\' 1"'$  (1.549 m), weight 53.8 kg, SpO2 100 %.  . Medical Problem List and Plan: 1. Functional deficits secondary to Central cord syndrome as well as TBI (SAH/SDH)             -patient may shower, ordered [place] collar for shower             -ELOS/Goals: 25 days           -Continue CIR therapies including PT, OT, and SLP  2.  Antithrombotics: -DVT/anticoagulation:  Pharmaceutical: Lovenox started pm 06/02              -antiplatelet therapy: N/a due to bleed.  3. Pain: continue Tylenol QID, Cymbalta '90mg'$  daily, gabapentin, tramadol  -encouraged patient to massage area, avoid direct pressure when she can. It should improve with time 4. Mood/sleep: pt to provide ego support -pt is already on cymbalta and xanax as pta for anxiety disorder   -adjusted xanax to '1mg'$  qhs she uses at home which has helped sleep  - free T4 level ok, acutally a bit high - neuropsych requested -team/family continues to provide positive reinforcement and support -               -antipsychotic agents: N/A 5. Neuropsych: This patient is capable of making decisions on her own behalf. 6. Skin/Wound Care: wound generally dry. I think she might be loosening it at times when she rubs on pillow, causing some of drainage 7. Fluids/Electrolytes/Nutrition: encourage PO     -BUN sl improved     -have dc'ed IVF. Recheck bmet 6/16 8.  Seizure proph: keppra completed 9. L occipital calvarium Fx: Soft collar at all times with outpatient follow up w/Dr. Annette Stable  -no ecchymosis or drainage  10. R- 2nd rib Fx: Encouraging IS 11. 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  -still having sensory sx in hands, LE's to lesser extent,no  change 12. CKD stage IV: Followed by Dr. Theador Hawthorne Baseline SCr- -- Continue sodium bicarb 13. H/o UGIB/Iron deficiency anemia/HH: Add iron supplement. B12 reviewed and is stable.  14. TURBT 04/16/2022 for invasive high grade bladder CA: h/o dysuria/frequency -palliative xrt at [facility]-- -sessions after therapy complete- pt worried about her endurance    16. 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  17. Vitamin D deficiency: started ergocalciferol 50,000U once per  week for 7 weeks 18. BPPV: changed meclizine to tid prn  -vestibular treatment limited d/t cervical issues  LOS: 12 days A FACE TO FACE EVALUATION WAS PERFORMED  Jane Andrews 05/29/2022, 9:25 AM

## 2022-05-30 ENCOUNTER — Ambulatory Visit
Admit: 2022-05-30 | Discharge: 2022-05-30 | Disposition: A | Discharge status: Home / Self Care | Payer: Medicare Other | Attending: Radiation Oncology | Admitting: Radiation Oncology

## 2022-05-30 ENCOUNTER — Other Ambulatory Visit: Payer: Self-pay

## 2022-05-30 ENCOUNTER — Ambulatory Visit: Payer: Medicare Other

## 2022-05-30 DIAGNOSIS — Z510 Encounter for antineoplastic radiation therapy: Secondary | ICD-10-CM | POA: Diagnosis not present

## 2022-05-30 DIAGNOSIS — C670 Malignant neoplasm of trigone of bladder: Secondary | ICD-10-CM | POA: Diagnosis not present

## 2022-05-30 LAB — RAD ONC ARIA SESSION SUMMARY
Course Elapsed Days: 3
Plan Fractions Treated to Date: 4
Plan Prescribed Dose Per Fraction: 3.5 Gy
Plan Total Fractions Prescribed: 8
Plan Total Prescribed Dose: 28 Gy
Reference Point Dosage Given to Date: 14 Gy
Reference Point Session Dosage Given: 3.5 Gy
Session Number: 4

## 2022-05-30 LAB — BASIC METABOLIC PANEL
Anion gap: 8 (ref 5–15)
BUN: 29 mg/dL — ABNORMAL HIGH (ref 8–23)
CO2: 18 mmol/L — ABNORMAL LOW (ref 22–32)
Calcium: 8.8 mg/dL — ABNORMAL LOW (ref 8.9–10.3)
Chloride: 113 mmol/L — ABNORMAL HIGH (ref 98–111)
Creatinine, Ser: 1.5 mg/dL — ABNORMAL HIGH (ref 0.44–1.00)
GFR, Estimated: 32 mL/min — ABNORMAL LOW (ref >60)
Glucose, Bld: 91 mg/dL (ref 70–99)
Potassium: 3.9 mmol/L (ref 3.5–5.1)
Sodium: 139 mmol/L (ref 135–145)

## 2022-05-30 NOTE — Progress Notes (Signed)
Physical Therapy Session Note  Patient Details  Name: Jane Andrews MRN: [id number] Date of Birth: Feb 08, 1925  Today's Date: 05/30/2022 PT Individual Time: 1300-1340 PT Individual Time Calculation (min): 40 min   Short Term Goals: Week 1:  PT Short Term Goal 1 (Week 1): Patient will perform basic transfers with equal to or better than mod A consistently. PT Short Term Goal 1 - Progress (Week 1): Met PT Short Term Goal 2 (Week 1): Patient will ambulate >50 ft using LRAD with mod A. PT Short Term Goal 2 - Progress (Week 1): Met PT Short Term Goal 3 (Week 1): Patient will initiate stair training. PT Short Term Goal 3 - Progress (Week 1): Progressing toward goal Week 2:  PT Short Term Goal 1 (Week 2): Patient will perform bed mobility with min A consistently. PT Short Term Goal 2 (Week 2): Patient will perform basic transfers with min A >50% of the time. PT Short Term Goal 3 (Week 2): Patient will initiate stair training. PT Short Term Goal 4 (Week 2): Patient will ambulate >50 ft with min A using LRAD.   Skilled Therapeutic Interventions/Progress Updates:   Pt received sitting in WC and agreeable to PT. Pt transported to entrance of [place]. Pt noted to have no ted hose or abdominal binder in place. Pt reports being asymptomatic throughout day without teds or binder.   Gait training with RW 2 x 28f with min assist from PT with mild LOB in turns requiring assist from PT to prevent. Posterior LOB. Pt also required cues from PT to maintain adequate step width throughout gait training over cement sidewalk. No reports of orthostatic s/s   Pt performed 5 time sit<>stand (5xSTS):  34.6 37 sec, 34, sec and 33 sec  (>15 sec indicates increased fall risk)   [facility] and all needs met.        Therapy Documentation Precautions:  Precautions Precautions: Fall, Cervical Precaution Booklet Issued: No Precaution Comments: verbal  review of neck precautions, will need reinforcement Required Braces or Orthoses: Cervical Brace Cervical Brace: Soft collar Restrictions Weight Bearing Restrictions: No Other Position/Activity Restrictions: 2nd rib fx    Vital Signs: Therapy Vitals Temp: 98.1 F (36.7 C) Pulse Rate: 89 Resp: 16 BP: 105/72 Patient Position (if appropriate): Sitting Oxygen Therapy SpO2: 98 % O2 Device: Room Air Pain: denies   Therapy/Group: Individual Therapy  ALorie Phenix6/16/2023, 2:42 PM

## 2022-05-30 NOTE — Progress Notes (Signed)
Speech Language Pathology TBI Note  Patient Details  Name: Jane Andrews MRN: [id number] Date of Birth: 05-14-1925  Today's Date: 05/30/2022 SLP Individual Time: 0930-1030 SLP Individual Time Calculation (min): 60 min  Short Term Goals: Week 2: SLP Short Term Goal 1 (Week 2): Patient will demonstrate functional problem solving with mildly complex tasks with Min verbal cues. SLP Short Term Goal 2 (Week 2): Patient will recall at least 3 pieces of functional information pertaining to her daily events with use of external and internal aids (WRAP strategies) with Min A verbal cues. SLP Short Term Goal 3 (Week 2): Patient will self-monitor and correct errors during functional tasks with Min verbal cues. SLP Short Term Goal 4 (Week 2): Patient will demonstrate sustained attention to functional tasks for 30 minutes with Min verbal cues for redirection.  Skilled Therapeutic Interventions: Skilled treatment session focused on cognitive goals. Upon arrival, patient was awake in bed. SLP facilitated session by providing Min verbal cues for recall of events from previous therapy sessions/day. SLP also facilitated session by providing Min-Mod visual and verbal cues for working memory and problem solving during a complex scheduling task. Patient demonstrated sustained attention to task for ~30 minutes with Mod I. Patient left upright in bed with alarm on and all needs within reach. Continue with current plan of care.      Pain No/Denies Pain   Agitated Behavior Scale: TBI No unsafe behaviors noted throughout admission, therefore, ABS discontinued   Therapy/Group: Individual Therapy  Katherinne Mofield 05/30/2022, 6:59 AM

## 2022-05-30 NOTE — Progress Notes (Addendum)
PROGRESS NOTE   Subjective/Complaints: No new issues this morning. Still sleeping well.   ROS: Patient denies fever, rash, sore throat, blurred vision, dizziness, nausea, vomiting, diarrhea, cough, shortness of breath or chest pain, joint or back/neck pain, headache, or mood change.   Objective:     No results found. Recent Labs    05/29/22 0540  WBC 8.7  HGB 9.6*  HCT 29.2*  PLT 195    Recent Labs    05/30/22 0516  NA 139  K 3.9  CL 113*  CO2 18*  GLUCOSE 91  BUN 29*  CREATININE 1.50*  CALCIUM 8.8*      Intake/Output Summary (Last 24 hours) at 05/30/2022 1013 Last data filed at 05/30/2022 0933 Gross per 24 hour  Intake 1237.93 ml  Output --  Net 1237.93 ml        Physical Exam: Vital Signs Blood pressure 128/74, pulse 80, temperature 98 F (36.7 C), resp. rate 15, height '5\' 1"'$  (1.549 m), weight 53.8 kg, SpO2 99 %.  General: No acute distress HEENT: NCAT, EOMI, oral membranes moist Cards: reg rate  Chest: normal effort Abdomen: Soft, NT, ND Skin: dry, intact Extremities: no edema Psych: pleasant and appropriate  Skin: occipital area healing, dry. Sensitive somewhat to palpation Neuro: Alert and oriented x3. Decreased sensation in bilateral hands and ulnar side of wrist. Proprioceptive deficits in legs too.  4/5 strength in upper extremities, 5/5 strength in lower extremities.  Motor exam stable.  Musculoskeletal: no pain with limb rom.   Assessment/Plan: 1. Functional deficits which require 3+ hours per day of interdisciplinary therapy in a comprehensive inpatient rehab setting. Physiatrist is providing close team supervision and 24 hour management of active medical problems listed below. Physiatrist and rehab team continue to assess barriers to discharge/monitor patient progress toward functional and medical goals  Care Tool:  Bathing    Body parts bathed by patient: Right arm, Left arm,  Chest, Abdomen, Right upper leg, Left upper leg, Face   Body parts bathed by helper: Front perineal area, Buttocks, Right lower leg, Left lower leg     Bathing assist Assist Level: Moderate Assistance - Patient 50 - 74%     Upper Body Dressing/Undressing Upper body dressing   What is the patient wearing?: Button up shirt    Upper body assist Assist Level: Moderate Assistance - Patient 50 - 74%    Lower Body Dressing/Undressing Lower body dressing      What is the patient wearing?: Incontinence brief, Pants     Lower body assist Assist for lower body dressing: Maximal Assistance - Patient 25 - 49%     Toileting Toileting    Toileting assist Assist for toileting: Moderate Assistance - Patient 50 - 74%     Transfers Chair/bed transfer  Transfers assist     Chair/bed transfer assist level: Minimal Assistance - Patient > 75% Chair/bed transfer assistive device: Programmer, multimedia   Ambulation assist      Assist level: Minimal Assistance - Patient > 75% Assistive device: Walker-rolling Max distance: 100 ft   Walk 10 feet activity   Assist  Walk 10 feet activity did not occur: Safety/medical concerns  Assist level: Minimal Assistance - Patient > 75% Assistive device: Walker-rolling   Walk 50 feet activity   Assist Walk 50 feet with 2 turns activity did not occur: Safety/medical concerns  Assist level: Minimal Assistance - Patient > 75% Assistive device: Walker-rolling    Walk 150 feet activity   Assist Walk 150 feet activity did not occur: Safety/medical concerns         Walk 10 feet on uneven surface  activity   Assist Walk 10 feet on uneven surfaces activity did not occur: Safety/medical concerns         Wheelchair     Assist Is the patient using a wheelchair?: Yes Type of Wheelchair: Manual    Wheelchair assist level: Dependent - Patient 0%      Wheelchair 50 feet with 2 turns activity    Assist         Assist Level: Dependent - Patient 0%   Wheelchair 150 feet activity     Assist      Assist Level: Dependent - Patient 0%   Blood pressure 128/74, pulse 80, temperature 98 F (36.7 C), resp. rate 15, height '5\' 1"'$  (1.549 m), weight 53.8 kg, SpO2 99 %.  . Medical Problem List and Plan: 1. Functional deficits secondary to Central cord syndrome as well as TBI (SAH/SDH)             -patient may shower, ordered [place] collar for shower             -ELOS/Goals: 06/06/22           -Continue CIR therapies including PT, OT, and SLP   2.  Antithrombotics: -DVT/anticoagulation:  Pharmaceutical: Lovenox started pm 06/02              -antiplatelet therapy: N/a due to bleed.  3. Pain: continue Tylenol QID, Cymbalta '90mg'$  daily, gabapentin, tramadol  -encouraged patient to massage area, avoid direct pressure when she can. It should improve with time 4. Mood/sleep: pt to provide ego support -pt is already on cymbalta and xanax as pta for anxiety disorder   -adjusted xanax to '1mg'$  qhs she uses at home which has helped sleep  - free T4 level ok, acutally a bit high - neuropsych requested -team/family continues to provide positive reinforcement and support -               -antipsychotic agents: N/A 5. Neuropsych: This patient is capable of making decisions on her own behalf. 6. Skin/Wound Care: wound cdi. Still some associated sensitivity  7. Fluids/Electrolytes/Nutrition: encourage PO     -6/16 BUN  has improved back to baseline range   -dc IVF. Recheck bmet 6/19 8.  Seizure proph: keppra completed 9. L occipital calvarium Fx: Soft collar at all times with outpatient follow up w/Dr. Annette Stable  -no ecchymosis or drainage  10. R- 2nd rib Fx: Encouraging IS 11. 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  -still having sensory sx in hands, LE's to lesser extent,no change 12. CKD stage IV: Followed by Dr. Theador Hawthorne Baseline SCr- -- Continue sodium  bicarb -see #7 13. H/o UGIB/Iron deficiency anemia/HH: Add iron supplement. B12 reviewed and is stable.  14. TURBT 04/16/2022 for invasive high grade bladder CA: h/o dysuria/frequency -palliative xrt at [facility]-- -sessions after therapy complete- pt worried about her endurance    16. 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  17. Vitamin D deficiency: started ergocalciferol 50,000U once per week for 7 weeks 18.  BPPV: changed meclizine to tid prn  -vestibular treatment limited d/t cervical issues  LOS: 13 days A FACE TO FACE EVALUATION WAS PERFORMED  Meredith Staggers 05/30/2022, 10:13 AM

## 2022-05-30 NOTE — Progress Notes (Addendum)
Patient ID: Jane Andrews, female   DOB: [birth year], 88 y.o.   MRN: [id number]   1SW scheduled transportation with [facility] [phone]) for the following pick up times to [facility]:  6/19 & 6/20 p/u at 3:30pm for appt at 4pm 6/21 p/u at 4pm for appt at 4:30pm 6/22 p/u at 3:15 for appt at 3:45pm  *SW received phone call from pt niece [name] to discuss family edu. Scheduled for Wed (6/21) 9am-12pm.   Loralee Pacas, MSW, [place] Office: [phone] Cell: [phone] Fax: [phone]

## 2022-05-30 NOTE — Progress Notes (Signed)
Occupational Therapy Session Note  Patient Details  Name: Jane Andrews MRN: [id number] Date of Birth: September 29, 1925  Today's Date: 05/30/2022 Session 1 OT Individual Time: 1115-1200 OT Individual Time Calculation (min): 45 min   Session 2 OT Individual Time: 1415-1500 OT Individual Time Calculation (min): 45 min    Short Term Goals: Week 2:  OT Short Term Goal 1 (Week 2): Pt will use built up handles as needed to complete grooming tasks with set up assist only OT Short Term Goal 2 (Week 2): Pt will complete a stand pivot transfer to the toilet with min A OT Short Term Goal 3 (Week 2): Pt will don pants with min A overall, AD as needed  Skilled Therapeutic Interventions/Progress Updates:     Session 1: Pt received supine in bed with bed pan in use. Pt completed bed mobility to the left <> right with (S). Urine did not make it fully into the bed pan. Supine to EOB completed with Min A. Stand pivot from EOB to w/c with Min A. Pt practiced unbottoning top shirt but voiced she was not able to. With encouragement, ended up manipulating 2 buttons and therapist finished. Pt with c/o hands being cold so gloves were donned. Pt stood from w/c with Min A and LB clothing donned with total A while Pt balanced with hands on the w/c. Back/bottom washed with Total A. Pt left seated in w/c with nurse present in the room. Pt with multiple comments of worsening depressive symptoms.   Session 2 Pt greeted seated in wc and agreeable to OT treatment session. Pt tolerated being up through lunch and stated she had a good lunch. Niece was on her way out the door after visiting. Pt worked on fine motor control with theraputty activity to locate beads in soft  putty. Pt requested we do something with her hair. With pt consent, OT cut out part of hair that was stuck to her scab from head wound on posterior aspect of scalp. Wound looked almost healed. Pt brought outside for mood boost. Talked about her depression and  provided emotional support. Continued working on fine motor control with "Pop-It." Also practied manipulating shapes of different colors and sizes. Pt returned to room and pivoted back to bed with Min A. Pt able to bring LE's back into bed. Pt left semi-reclined in bed with bed alarm on, call bell in reach and needs met.   Therapy Documentation Precautions:  Precautions Precautions: Fall, Cervical Precaution Booklet Issued: No Precaution Comments: verbal review of neck precautions, will need reinforcement Required Braces or Orthoses: Cervical Brace Cervical Brace: Soft collar Restrictions Weight Bearing Restrictions: No Other Position/Activity Restrictions: 2nd rib fx Pain:  Denies pain  Therapy/Group: Individual Therapy  Valma Cava 05/30/2022, 3:08 PM

## 2022-05-31 NOTE — Progress Notes (Signed)
Physical Therapy Session Note  Patient Details  Name: Jane Andrews MRN: [id number] Date of Birth: Jul 30, 1925  Today's Date: 05/31/2022 PT Individual Time: 0907-1002 PT Individual Time Calculation (min): 55 min   Short Term Goals: Week 2:  PT Short Term Goal 1 (Week 2): Patient will perform bed mobility with min A consistently. PT Short Term Goal 2 (Week 2): Patient will perform basic transfers with min A >50% of the time. PT Short Term Goal 3 (Week 2): Patient will initiate stair training. PT Short Term Goal 4 (Week 2): Patient will ambulate >50 ft with min A using LRAD.  Skilled Therapeutic Interventions/Progress Updates:    Pt received supine in bed awake and agreeable to therapy session. Pt already wearing soft cervical collar and remained donned throughout session. Supine>sitting R EOB, HOB elevated 20deg and using bedrail, with CGA and increased time and effort via logroll technique to maintain cervical precautions - continues to have dizziness episode lasting <20seconds while coming upright. Sitting EOB, donned socks and tennis shoes max assist due to pt reporting having a "bad bad" and not being able to reach down comfortably.   L stand pivot EOB>w/c using RW with min assist for balance - cuing to push up with L hand when coming to stand - cuing to turn fully prior to initiating sit - demos improved/wider BOS and improved control of L foot placement when stepping while turning.  Transported to/from gym in w/c for time management and energy conservation.   Pt not wearing TED hose nor abdominal binder throughout session.  Sitting in w/c vitals at beginning: BP 116/75 (MAP 88), HR 97bpm   Gait training 169f using RW with primarily min assist but mod assist when turning due to poor L LE coordination when stepping causing scissoring and L LOB - overall though has improving L LE foot positioning with only min intermittent cuing needed to avoid scissoring - continues to have overall  poor control over her balance while moving with frequent anterior lean - cuing for turning fully and keeping AD close when turning to sit.   Vitals after gait: BP 134/71 (MAP 90), HR 117bpm No report of symptoms.  Gait training through ~474fobstacle course focusing on turning and managing AD - requires min/mod assist of 1 for balance - requires repeated cuing to ensure L LE abducted to avoid scissoring when turning, keeping AD close to her body, maintaining upright posture to avoid progressively worsening anterior lean/LOB, and cuing/education on importance of moving slow and controlled as pt starts to have poor control over her balance when moving too quickly.  Repeated x3 total  Stair navigation training ascending/descending 4 steps (6" height) using B HRs with heavy min assist of 1 for balance - step-to pattern leading with R LE on ascent and L LE on descent - requires max cuing to keep L LE abducted and placing it fully forward on step during ascent - requires more assist for balance on descent due to L posterior lean.   Transported back to room.   Gait training ~1590f/c>recliner using RW with heavy min assist of 1 for balance - noticed that when pt looking down at her feet she has overall slower and more controlled foot placement and balance compared to when she looks up at surroundings. Discussed goal of improving this for pt safety as she needs to be able to look up around her.  Pt left seated in recliner with needs in reach and seat belt alarm on.  Therapy Documentation Precautions:  Precautions Precautions: Fall, Cervical Precaution Booklet Issued: No Precaution Comments: verbal review of neck precautions, will need reinforcement Required Braces or Orthoses: Cervical Brace Cervical Brace: Soft collar Restrictions Weight Bearing Restrictions: No Other Position/Activity Restrictions: 2nd rib fx   Pain: Reports R knee pain during session - pt inquiring if she can "get a shot" in  her knee while on CIR - will defer to primary rehab MD. Provided seated rest breaks for pain management during session.   Therapy/Group: Individual Therapy  Tawana Scale , PT, DPT, NCS, CSRS 05/31/2022, 7:54 AM

## 2022-05-31 NOTE — Progress Notes (Signed)
Speech Language Pathology TBI Note  Patient Details  Name: Jane Andrews MRN: [id number] Date of Birth: 10-09-1925  Today's Date: 05/31/2022 SLP Individual Time:  -     Short Term Goals: Week 2: SLP Short Term Goal 1 (Week 2): Patient will demonstrate functional problem solving with mildly complex tasks with Min verbal cues. - Pt demonstrated functional problem-solving with mildly complex task (balancing a checkbook register) with Mod-Max A and Total A for writing.   SLP Short Term Goal 2 (Week 2): Patient will recall at least 3 pieces of functional information pertaining to her daily events with use of external and internal aids (WRAP strategies) with Min A verbal cues. - Did not formally address this date.  SLP Short Term Goal 3 (Week 2): Patient will self-monitor and correct errors during functional tasks with Min verbal cues. Min verbal A for self-correcting during functional task r/t money management.   SLP Short Term Goal 4 (Week 2): Patient will demonstrate sustained attention to functional tasks for 30 minutes with Min verbal cues for redirection. - Pt demonstrated sustained attention to functional task for 15 minutes with Sup A.   Skilled Therapeutic Interventions: Pt seen this date for skilled ST intervention targeting cognitive-linguistic goals outlined above. Pt encountered awake/alert and lying semi-reclined in bed with soft collar donned. No c/o pain. Pt's friend present for the majority of today's session. Agreeable to ST intervention at bedside.   Please see above for objective data re: pt's performance on targeted goals. Pt continues to be self-limiting at times. Education completed re: ST POC, progress thus far, and recommendation from 24/7 supervision and assistance at time of discharge; pt verbalized understanding, stating that her niece is planning to help her with her medication management post-discharge.  Pt left in bed with all safety measures activated. Call bell  reviewed and within reach and all immediate needs met. Continue per current ST POC.  Pain Denies pain; NAD  Agitated Behavior Scale: TBI No unsafe behaviors noted throughout admission, therefore, ABS discontinued   Therapy/Group: Individual Therapy  Nery Kalisz A Akita Maxim 05/31/2022, 7:21 AM

## 2022-05-31 NOTE — Progress Notes (Signed)
Pts PVR showed 433m. Pt was educated on provider orders regarding PVR volume, including In/Out procedure.Pt refused. This nurse offered to toilet the pt again, pt declined. Pt states "just give me time, I think I can go". Pt was notified of risk and benefits of the In/ Out cath procedure and continue to refuse. Provider will be made aware.

## 2022-05-31 NOTE — Progress Notes (Signed)
PROGRESS NOTE   Subjective/Complaints:  No new issues today was working with physical therapy earlier this morning so had to circle back to see patient.  Reports that she is sleeping well her nephew is in the room visiting.  ROS: Patient denies fever, rash, sore throat, blurred vision, dizziness, nausea, vomiting, diarrhea, cough, shortness of breath or chest pain, joint or neck pain, back pain, headache, or mood change   Objective:     No results found. Recent Labs    05/29/22 0540  WBC 8.7  HGB 9.6*  HCT 29.2*  PLT 195    Recent Labs    05/30/22 0516  NA 139  K 3.9  CL 113*  CO2 18*  GLUCOSE 91  BUN 29*  CREATININE 1.50*  CALCIUM 8.8*      Intake/Output Summary (Last 24 hours) at 05/31/2022 1611 Last data filed at 05/31/2022 1256 Gross per 24 hour  Intake 657 ml  Output 414 ml  Net 243 ml        Physical Exam: Vital Signs Blood pressure (!) 109/59, pulse 80, temperature 97.6 F (36.4 C), resp. rate 20, height '5\' 1"'$  (1.549 m), weight 53.8 kg, SpO2 98 %.  General: Not in acute distress alert and awake. HEENT: NCAT, EOMI, moist oral membranes Cardiac: Regular rate rhythm no extra heart sounds no clicks rubs murmurs no JVD. Respiratory: CTA bilaterally, normal effort. Abdomen: Soft, non-tender, non-distended Skin: Warm dry and intact Extremities: No edema present. Psych: Pleasant and cooperative. Skin: Occipital area, dry, healing and is somewhat sensitive to palpation. Neuro: A XO x 3.  Decreased sensation bilateral hans and ulnar side of wrists.  Describes sensation as "sandpaper hands".  Also described "sandpaper" sensation of soles of feet.  Has some proprioception deficits in legs.   Motor: 4/5 LUR/RUE, 5/5 LLE/RLE, which is stable. Musculoskeletal: No pain with AROM/PROM.   Assessment/Plan: 1. Functional deficits which require 3+ hours per day of interdisciplinary therapy in a comprehensive  inpatient rehab setting. Physiatrist is providing close team supervision and 24 hour management of active medical problems listed below. Physiatrist and rehab team continue to assess barriers to discharge/monitor patient progress toward functional and medical goals  Care Tool:  Bathing    Body parts bathed by patient: Right arm, Left arm, Chest, Abdomen, Right upper leg, Left upper leg, Face   Body parts bathed by helper: Front perineal area, Buttocks, Right lower leg, Left lower leg     Bathing assist Assist Level: Moderate Assistance - Patient 50 - 74%     Upper Body Dressing/Undressing Upper body dressing   What is the patient wearing?: Button up shirt    Upper body assist Assist Level: Moderate Assistance - Patient 50 - 74%    Lower Body Dressing/Undressing Lower body dressing      What is the patient wearing?: Incontinence brief, Pants     Lower body assist Assist for lower body dressing: Maximal Assistance - Patient 25 - 49%     Toileting Toileting    Toileting assist Assist for toileting: Moderate Assistance - Patient 50 - 74%     Transfers Chair/bed transfer  Transfers assist     Chair/bed transfer  assist level: Minimal Assistance - Patient > 75% Chair/bed transfer assistive device: Museum/gallery exhibitions officer assist      Assist level: Minimal Assistance - Patient > 75% Assistive device: Walker-rolling Max distance: 100 ft   Walk 10 feet activity   Assist  Walk 10 feet activity did not occur: Safety/medical concerns  Assist level: Minimal Assistance - Patient > 75% Assistive device: Walker-rolling   Walk 50 feet activity   Assist Walk 50 feet with 2 turns activity did not occur: Safety/medical concerns  Assist level: Minimal Assistance - Patient > 75% Assistive device: Walker-rolling    Walk 150 feet activity   Assist Walk 150 feet activity did not occur: Safety/medical concerns         Walk 10 feet on  uneven surface  activity   Assist Walk 10 feet on uneven surfaces activity did not occur: Safety/medical concerns         Wheelchair     Assist Is the patient using a wheelchair?: Yes Type of Wheelchair: Manual    Wheelchair assist level: Dependent - Patient 0%      Wheelchair 50 feet with 2 turns activity    Assist        Assist Level: Dependent - Patient 0%   Wheelchair 150 feet activity     Assist      Assist Level: Dependent - Patient 0%   Blood pressure (!) 109/59, pulse 80, temperature 97.6 F (36.4 C), resp. rate 20, height '5\' 1"'$  (1.549 m), weight 53.8 kg, SpO2 98 %.  . Medical Problem List and Plan: 1. Functional deficits secondary to Central cord syndrome as well as TBI (SAH/SDH)             -patient may shower, ordered [place] collar for shower             -ELOS/Goals: 06/06/22           -Continue CIR therapies including PT, OT, and SLP   2.  Antithrombotics: -DVT/anticoagulation:  Pharmaceutical: Lovenox started pm 06/02              -antiplatelet therapy: N/a due to bleed.  3. Pain: continue Tylenol QID, Cymbalta '90mg'$  daily, gabapentin, tramadol  -encouraged patient to massage area, avoid direct pressure when she can. It should improve with time 4. Mood/sleep: pt to provide ego support -pt is already on cymbalta and xanax as pta for anxiety disorder   -adjusted xanax to '1mg'$  qhs she uses at home which has helped sleep  - free T4 level ok, acutally a bit high - neuropsych requested -team/family continues to provide positive reinforcement and support              -antipsychotic agents: N/A 5. Neuropsych: This patient is capable of making decisions on her own behalf. 6. Skin/Wound Care: wound cdi. Still some associated sensitivity  7. Fluids/Electrolytes/Nutrition: encourage PO     -6/16 BUN  has improved back to baseline range   -dc IVF. Recheck bmet 6/19               6/17 Pending qMonday labs    Latest Ref Rng & Units 05/30/2022     5:16 AM 05/26/2022    5:28 AM 05/24/2022    5:07 AM  BMP  Glucose 70 - 99 mg/dL 91  91  96   BUN 8 - 23 mg/dL 29  36  40   Creatinine 0.44 - 1.00 mg/dL 1.50  1.53  1.70  Sodium 135 - 145 mmol/L 139  136  137   Potassium 3.5 - 5.1 mmol/L 3.9  3.7  3.9   Chloride 98 - 111 mmol/L 113  108  107   CO2 22 - 32 mmol/L '18  18  19   '$ Calcium 8.9 - 10.3 mg/dL 8.8  8.5  8.9     8.  Seizure proph: keppra completed 9. L occipital calvarium Fx: Soft collar at all times with outpatient follow up w/Dr. Annette Stable  -no ecchymosis or drainage              6/17 Site C/D/I 10. R- 2nd rib Fx: Encouraging IS 11. 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  -still having sensory sx in hands, LE's to lesser extent,no change 12. CKD stage IV: Followed by Dr. Theador Hawthorne Baseline SCr- -- Continue sodium bicarb -see #7 13. H/o UGIB/Iron deficiency anemia/HH: Add iron supplement. B12 reviewed and is stable.  6/17 Continue iron supplements 14. TURBT 04/16/2022 for invasive high grade bladder CA: h/o dysuria/frequency -palliative xrt at [facility]-- -sessions after therapy complete- pt worried about her endurance  16. 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  17. Vitamin D deficiency: started ergocalciferol 50,000U once per week for 7 weeks 18. BPPV: changed meclizine to tid prn  -vestibular treatment limited d/t cervical issues  LOS: 14 days A FACE TO FACE EVALUATION WAS PERFORMED  Luetta Nutting 05/31/2022, 4:11 PM

## 2022-06-01 ENCOUNTER — Inpatient Hospital Stay (HOSPITAL_COMMUNITY): Payer: Medicare Other

## 2022-06-01 NOTE — Progress Notes (Signed)
Daughter at the bedside expressing her concerns about the fall that occurred in the early morning hours today. Questions were answered and daughter was made aware of the post fall care being provided. Daughter stated that she did not want the tech involved with the fall present in her mother's room anymore. Daughter made aware that director will be in on Tuesday to follow up. Unsure of the time but instructed daughter to call Tuesday morning for more information.

## 2022-06-01 NOTE — Progress Notes (Addendum)
PROGRESS NOTE   Subjective/Complaints:  Notified upon arriving on 4W unit by bedside nurses [place] of an unwitnessed and unreported fall that occurred in the early morning hours today. The fall was reported by the patient to day shift nurses when they entered her room for her morning medication pass.  See note on event in separate progress report.  Patient limited historian of event. Full CN exam performed, non-focal, visible injuries limited to left forearm open linear laceration and bruising and patient reported lower back pain.   ROS: Patient denies fever, rash, sore throat, blurred vision, dizziness, has chronic diarrhea (short bowel syndrome), or mood change denies headache, neck pain, +Nausea, +vomiting both new and abrupt s/p unwitnessed fall today, +lower back pain s/p mechanical fall to floor today, +bruising, bleeding Left FA   Objective:     DG Lumbar Spine 2-3 Views  Result Date: 06/01/2022 CLINICAL DATA:  Fall last night in hospital room.  Low back pain. EXAM: LUMBAR SPINE - 2-3 VIEW COMPARISON:  04/27/2019 FINDINGS: There is no evidence of lumbar spine fracture. Severe degenerative disc disease is again seen from L1 through S1. Mid and lower lumbar spine facet DJD is again seen, right side greater than left, with mild degenerative levoscoliosis. Grade 1 degenerative anterolisthesis at L5-S1 shows no significant change, measuring approximately 5 mm. Generalized osteopenia is also noted, however no focal lytic or sclerotic bone lesions identified. Staghorn calculus again seen within the right renal collecting system. IMPRESSION: No acute findings. Stable severe lumbar degenerative spondylosis, with grade 1 anterolisthesis at L5-S1 and levoscoliosis. Right renal staghorn calculus again noted. Electronically Signed   By: Marlaine Hind M.D.   On: 06/01/2022 10:38   No results for input(s): "WBC", "HGB", "HCT", "PLT" in the  last 72 hours.   Recent Labs    05/30/22 0516  NA 139  K 3.9  CL 113*  CO2 18*  GLUCOSE 91  BUN 29*  CREATININE 1.50*  CALCIUM 8.8*      Intake/Output Summary (Last 24 hours) at 06/01/2022 1122 Last data filed at 05/31/2022 2107 Gross per 24 hour  Intake 600 ml  Output --  Net 600 ml        Physical Exam: Vital Signs Blood pressure 112/76, pulse 84, temperature 98.4 F (36.9 C), temperature source Oral, resp. rate 18, height '5\' 1"'$  (1.549 m), weight 53.8 kg, SpO2 97 %.  General: Alert and awake, had mechanical fall early this morning but not in acute distress. HEENT: NCAT, EOMI, moist oral membranes Note: No visible new head trauma s/p mechanical fall 06/01/22 Cardiac: Regular rate rhythm no extra heart sounds no clicks rubs murmurs no JVD. Respiratory: CTA bilaterally, normal effort. Abdomen: Soft, non-tender, non-distended Note: Patient experienced an abrupt on-set of vomiting this morning s/p fall. Skin: Warm dry and intact Extremities: No edema present. Psych: Pleasant and cooperative. Skin: Occipital area, dry, healing and is somewhat sensitive to palpation. Note: s/p mechanical fall 06/01/22 no visible new head trauma seen on exam. Note: S/p mechanical fall with bruising on lt FA and a linear laceration on left FA about 3 cm long, but not deep, secured by nurses with steri-strips. Neuro: A  XO x 3.  Decreased sensation bilateral hands and ulnar side of wrists.  Describes sensation as "sandpaper hands".  Also described "sandpaper" sensation of soles of feet.  Has some proprioception deficits in legs.  Her sensory and proprioception deficits are stable without appreciable change. Note. Mental status normal, A x O x 3. Patient experienced an abrupt on-set of vomiting this morning s/p fall. CN exam: Normal, non-focal. II: Normal visual fields, all 4 quadrants III: PERRLA, normal accomodation (finger to nose) IV: Can move eyes down and in V: Motor: Normal jaw strength,  Sensory: normal sensation for all 3 branches  VI: Can move eyes side to side VII: Able to smile, raise eyebrows, puff out cheeks VIII: Normal hearing IX: Intact gag reflex, normal swallowing X: Normal elevated of palate/uvula, can push tongue against cheek XI: Able to protrude tongue XII: Normal shoulder elevation, against resistance Musculoskeletal: Patient has full AROM/PROM of left fingers, left wrist, left elbow, and left shoulder without pain s/p fall on left side of body. She is able to fully move left hip and leg without pain s/p mechanical fall.  However, is reporting lumbar back pain s/p fall. Motor: 4/5 LUR/RUE, 5/5 LLE/RLE, which is stable.    Assessment/Plan: 1. Functional deficits which require 3+ hours per day of interdisciplinary therapy in a comprehensive inpatient rehab setting. Physiatrist is providing close team supervision and 24 hour management of active medical problems listed below. Physiatrist and rehab team continue to assess barriers to discharge/monitor patient progress toward functional and medical goals  Care Tool:  Bathing    Body parts bathed by patient: Right arm, Left arm, Chest, Abdomen, Right upper leg, Left upper leg, Face   Body parts bathed by helper: Front perineal area, Buttocks, Right lower leg, Left lower leg     Bathing assist Assist Level: Moderate Assistance - Patient 50 - 74%     Upper Body Dressing/Undressing Upper body dressing   What is the patient wearing?: Button up shirt    Upper body assist Assist Level: Moderate Assistance - Patient 50 - 74%    Lower Body Dressing/Undressing Lower body dressing      What is the patient wearing?: Incontinence brief, Pants     Lower body assist Assist for lower body dressing: Maximal Assistance - Patient 25 - 49%     Toileting Toileting    Toileting assist Assist for toileting: Moderate Assistance - Patient 50 - 74%     Transfers Chair/bed transfer  Transfers assist      Chair/bed transfer assist level: Minimal Assistance - Patient > 75% Chair/bed transfer assistive device: Programmer, multimedia   Ambulation assist      Assist level: Minimal Assistance - Patient > 75% Assistive device: Walker-rolling Max distance: 100 ft   Walk 10 feet activity   Assist  Walk 10 feet activity did not occur: Safety/medical concerns  Assist level: Minimal Assistance - Patient > 75% Assistive device: Walker-rolling   Walk 50 feet activity   Assist Walk 50 feet with 2 turns activity did not occur: Safety/medical concerns  Assist level: Minimal Assistance - Patient > 75% Assistive device: Walker-rolling    Walk 150 feet activity   Assist Walk 150 feet activity did not occur: Safety/medical concerns         Walk 10 feet on uneven surface  activity   Assist Walk 10 feet on uneven surfaces activity did not occur: Safety/medical concerns         Wheelchair  Assist Is the patient using a wheelchair?: Yes Type of Wheelchair: Manual    Wheelchair assist level: Dependent - Patient 0%      Wheelchair 50 feet with 2 turns activity    Assist        Assist Level: Dependent - Patient 0%   Wheelchair 150 feet activity     Assist      Assist Level: Dependent - Patient 0%   Blood pressure 112/76, pulse 84, temperature 98.4 F (36.9 C), temperature source Oral, resp. rate 18, height '5\' 1"'$  (1.549 m), weight 53.8 kg, SpO2 97 %.  . Medical Problem List and Plan: 1. Functional deficits secondary to Central cord syndrome as well as TBI (SAH/SDH)             -patient may shower, ordered [place] collar for shower             -ELOS/Goals: 06/06/22           -Continue CIR therapies including PT, OT, and SLP   2.  Antithrombotics: -DVT/anticoagulation:  Pharmaceutical: Lovenox started pm 06/02              -antiplatelet therapy: N/a due to bleed.  3. Pain: continue Tylenol QID, Cymbalta '90mg'$  daily, gabapentin,  tramadol  -encouraged patient to massage area, avoid direct pressure when she can. It should improve with time 4. Mood/sleep: pt to provide ego support -pt is already on cymbalta and xanax as pta for anxiety disorder   -adjusted xanax to '1mg'$  qhs she uses at home which has helped sleep  - free T4 level ok, acutally a bit high - neuropsych requested -team/family continues to provide positive reinforcement and support              -antipsychotic agents: N/A 5. Neuropsych: This patient is capable of making decisions on her own behalf. 6. Skin/Wound Care: wound cdi. Still some associated sensitivity  7. Fluids/Electrolytes/Nutrition: encourage PO     -6/16 BUN  has improved back to baseline range   -dc IVF. Recheck bmet 6/19               6/18 Pending qMonday labs    Latest Ref Rng & Units 05/30/2022    5:16 AM 05/26/2022    5:28 AM 05/24/2022    5:07 AM  BMP  Glucose 70 - 99 mg/dL 91  91  96   BUN 8 - 23 mg/dL 29  36  40   Creatinine 0.44 - 1.00 mg/dL 1.50  1.53  1.70   Sodium 135 - 145 mmol/L 139  136  137   Potassium 3.5 - 5.1 mmol/L 3.9  3.7  3.9   Chloride 98 - 111 mmol/L 113  108  107   CO2 22 - 32 mmol/L '18  18  19   '$ Calcium 8.9 - 10.3 mg/dL 8.8  8.5  8.9     8.  Seizure proph: keppra completed 9. L occipital calvarium Fx: Soft collar at all times with outpatient follow up w/Dr. Annette Stable  -no ecchymosis or drainage              6/18 Site C/D/I 10. R- 2nd rib Fx: Encouraging IS 11. 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  -still having sensory sx in hands, LE's to lesser extent,no change 12. CKD stage IV: Followed by Dr. Theador Hawthorne Baseline SCr- -- Continue sodium bicarb -see #7 13. H/o UGIB/Iron deficiency anemia/HH: Add iron supplement. B12 reviewed and is stable.  6/18 Continue iron supplements 14. TURBT 04/16/2022 for invasive high grade bladder CA: h/o dysuria/frequency -palliative x[facility] at [facility]-- -sessions after therapy complete-  pt worried about her endurance  16. 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  17. Vitamin D deficiency: started ergocalciferol 50,000U once per week for 7 weeks 18. BPPV: changed meclizine to tid prn  -vestibular treatment limited d/t cervical issues 19. S/p mechanical fall unwitnessed early morning on 06/01/22 6/18 See notes regarding mechanical fall which occurred early morning hours, nursing staff learned of event per patient report during morning medication pass.  Examined and assessed pt with main areas of noted injury to the left FA (bruising and linear laceration/skin tear), no pain, swelling or tenderness of limitations of movement to left hand, wrist, arm, elbow, or shoulder.  Monitor for any signs or symptoms for which Xray may be considered.  No limitation of AROM/PROM for LLE or pain at this time.  However, orders X-ray for lumbar spine since pt self reported pain.  C-spine X-ray also done since she is admitted for central cord syndrome of C-spine.  At this time, not reporting C-spine symptoms.   -CT C-spine demonstrates no acute osseous abnormality identified in the cervical spine. Pt denies hitting head but is not completely sure of this, no visible new head trauma.  However, with new onset of abrupt vomiting, CT head ordered. -X-ray lumbar spine shows no acute findings. -CT head demonstrated: Regressed but not resolved small volume subarachnoid hemorrhage and/or hemorrhagic contusion in the anterior left frontal lobe since the CT last month. No associated mass effect. No new intracranial abnormality or new traumatic injury identified. -Continue to manage symptoms of nausea/vomiting/pain s/p fall.    LOS: 15 days A FACE TO FACE EVALUATION WAS PERFORMED  Luetta Nutting 06/01/2022, 11:22 AM

## 2022-06-01 NOTE — Progress Notes (Signed)
Received call from bedside nurses [place] that patient has abrupt onset of vomiting status-post fall.  Per bedside nurses, she has had no prior episodes of nausea and vomiting.  Went to bedside to assess no changes from this morning exam, no visible new head trauma, but ordered CT head to be sure.

## 2022-06-01 NOTE — Progress Notes (Signed)
Physical Therapy Note  Patient Details  Name: Jane Andrews MRN: [id number] Date of Birth: 1925-12-04 Today's Date: 06/01/2022    Patient in bed with nursing and medical staff in the room upon PT arrival. Per LPN, patient had a fall with staff this morning and is currently being assessed by staff with plan for imaging. Patient missed 30 min of skilled PT due to medical assessment post fall, LPN made aware. Will attempt to make-up missed time as able.      Nikan Ellingson L Mari[place]ula Krist PT, DPT  06/01/2022, 9:15 AM

## 2022-06-01 NOTE — Progress Notes (Signed)
Notified by bedside nurses [place] during rounds that patient had an unreported and unwitnessed fall during the night.  The fall was reported to the bedside nurses when they went to the patient's room to administer morning medications.  Per report of the patient, she fell out of bed onto her left side while the nurse tech was changing the linens/chuck.  Her left side rail was down and she was rolled onto her left side onto the floor.  The patient is a limited historian regarding exact body position when she fell.  She is unsure if she hit her head, although she doesn't appear to have any new head trauma. She does not think she lost consciousness.  She does have a scabbed, healing area on occiput of head from prior fall on 04/3022.  Her main visible injuries are a large skin tear on her left forearm which the nurses have secured with steri-strips.  She had FROM of her left hand, wrist, elbow, and shoulder without pain.  Full neurological exam shows no new focal deficits with her neuro exam consistent with yesterday's exam. She is able to move her left hip and leg without pain or ROM limitation.  She is reporting lower back back that is new today status-post fall.  Consulted with Dr. Roderic Palau, MD with regards to the fall and physical exam findings.

## 2022-06-01 NOTE — Progress Notes (Signed)
Physical Therapy Session Note  Patient Details  Name: Jane Andrews MRN: [id number] Date of Birth: Nov 06, 1925  Today's Date: 06/01/2022 PT Individual Time: 1601-0932 (make-up time from am) PT Individual Time Calculation (min): 30 min   Short Term Goals: Week 2:  PT Short Term Goal 1 (Week 2): Patient will perform bed mobility with min A consistently. PT Short Term Goal 2 (Week 2): Patient will perform basic transfers with min A >50% of the time. PT Short Term Goal 3 (Week 2): Patient will initiate stair training. PT Short Term Goal 4 (Week 2): Patient will ambulate >50 ft with min A using LRAD.  Skilled Therapeutic Interventions/Progress Updates:     Patient in bed with family at bedside upon PT arrival to make up missed time from this morning. Patient alert and agreeable to PT session. Patient denied pain during session aside from TTP over L calf, L forearm, and R posterior ribs. Focused session on assessment of pain and functional mobility following patient's fall this morning. Aside from minor brusing and TTP over areas listed above, patient with no other pain or change in function noted during mobility. Patient continues to perseverate on [name], however her niece reports she was able to eat without assistive devices over her silverware or spilling yesterday.   Discussed d/c planning and equipment needs with patient and her niece. Educated on hospital bed vs personal bed with bed rails and wedge/pillows. Patient would like to use her regular bed with rails at this time. Patient's niece reports it will be brought downstairs prior to d/c and rails will be ordered. Patient reports she has a RW, will need a w/c and BSC. Her niece stated that the home is an historic home with narrow doorways and thresholds between rooms. Educated on techniques for side-stepping with RW. Report all throw rugs will be removed for patient safety. Reports 3 steps with wide rails and 1x8" step to  enter the home. Will focus on household mobility this week prior to d/c with family education scheduled for Wednesday per patient's niece. He niece expressed concerns about d/c date with patient falling this morning and reports that she will be working the first week the patient is home before retiring. Informed her that this would be passed on to team and that the team would assess the patient's progress this week, her niece was satisfied with this at this time.   Therapeutic Activity: Bed Mobility: Patient performed supine to/from sit with CGA-supervision with use of hospital bed features. Provided verbal cues for log roll technique with poor carry-over of technique when returning to supine. Transfers: Patient performed sit to/from stand x2 with min A-CGA using RW. Provided verbal cues for hand placement forward weight shift, and completing turn and reaching back before sitting.  Gait Training:  Patient ambulated 22 feet using RW with min A. Ambulated with mild-moderate variable foot placement, improved BOS and control of stepping with cues. Provided verbal cues for increased BOS using RW as visual target and slowed cadence for increased control with stepping.  Patient in bed with family at bedside at end of session with breaks locked, bed alarm set, and all needs within reach.   Therapy Documentation Precautions:  Precautions Precautions: Fall, Cervical Precaution Booklet Issued: No Precaution Comments: verbal review of neck precautions, will need reinforcement Required Braces or Orthoses: Cervical Brace Cervical Brace: Soft collar Restrictions Weight Bearing Restrictions: No Other Position/Activity Restrictions: 2nd rib fx    Therapy/Group: Individual Therapy  Steward Sames L Girl Schissler PT, DPT  06/01/2022, 4:58 PM

## 2022-06-02 ENCOUNTER — Ambulatory Visit
Admit: 2022-06-02 | Discharge: 2022-06-02 | Disposition: A | Discharge status: Home / Self Care | Payer: Medicare Other | Attending: Radiation Oncology | Admitting: Radiation Oncology

## 2022-06-02 ENCOUNTER — Other Ambulatory Visit: Payer: Self-pay

## 2022-06-02 DIAGNOSIS — C670 Malignant neoplasm of trigone of bladder: Secondary | ICD-10-CM | POA: Diagnosis not present

## 2022-06-02 DIAGNOSIS — Z510 Encounter for antineoplastic radiation therapy: Secondary | ICD-10-CM | POA: Diagnosis not present

## 2022-06-02 LAB — RAD ONC ARIA SESSION SUMMARY
Course Elapsed Days: 6
Plan Fractions Treated to Date: 5
Plan Prescribed Dose Per Fraction: 3.5 Gy
Plan Total Fractions Prescribed: 8
Plan Total Prescribed Dose: 28 Gy
Reference Point Dosage Given to Date: 17.5 Gy
Reference Point Session Dosage Given: 3.5 Gy
Session Number: 5

## 2022-06-02 LAB — CBC
HCT: 31.4 % — ABNORMAL LOW (ref 36.0–46.0)
Hemoglobin: 10.4 g/dL — ABNORMAL LOW (ref 12.0–15.0)
MCH: 29.9 pg (ref 26.0–34.0)
MCHC: 33.1 g/dL (ref 30.0–36.0)
MCV: 90.2 fL (ref 80.0–100.0)
Platelets: 201 10*3/uL (ref 150–400)
RBC: 3.48 MIL/uL — ABNORMAL LOW (ref 3.87–5.11)
RDW: 14.6 % (ref 11.5–15.5)
WBC: 7.9 10*3/uL (ref 4.0–10.5)
nRBC: 0 % (ref 0.0–0.2)

## 2022-06-02 LAB — BASIC METABOLIC PANEL
Anion gap: 9 (ref 5–15)
BUN: 32 mg/dL — ABNORMAL HIGH (ref 8–23)
CO2: 16 mmol/L — ABNORMAL LOW (ref 22–32)
Calcium: 9 mg/dL (ref 8.9–10.3)
Chloride: 111 mmol/L (ref 98–111)
Creatinine, Ser: 1.52 mg/dL — ABNORMAL HIGH (ref 0.44–1.00)
GFR, Estimated: 32 mL/min — ABNORMAL LOW (ref >60)
Glucose, Bld: 90 mg/dL (ref 70–99)
Potassium: 3.9 mmol/L (ref 3.5–5.1)
Sodium: 136 mmol/L (ref 135–145)

## 2022-06-02 MED ORDER — POLYVINYL ALCOHOL 1.4 % OP SOLN
1.0000 [drp] | OPHTHALMIC | Status: DC | PRN
  Filled 2022-06-02: qty 15

## 2022-06-02 MED ORDER — ACETAMINOPHEN 325 MG PO TABS: 650.0000 mg | ORAL_TABLET | Freq: Three times a day (TID) | ORAL | Status: DC

## 2022-06-02 NOTE — Progress Notes (Signed)
Speech Language Pathology Daily TBI Session Note  Patient Details  Name: PRABHJOT MADDUX MRN: [id number] Date of Birth: [birth year]  Today's Date: 06/02/2022 SLP Individual Time: 4098-1191 SLP Individual Time Calculation (min): 55 min  Short Term Goals: Week 2: SLP Short Term Goal 1 (Week 2): Patient will demonstrate functional problem solving with mildly complex tasks with Min verbal cues. SLP Short Term Goal 2 (Week 2): Patient will recall at least 3 pieces of functional information pertaining to her daily events with use of external and internal aids (WRAP strategies) with Min A verbal cues. SLP Short Term Goal 3 (Week 2): Patient will self-monitor and correct errors during functional tasks with Min verbal cues. SLP Short Term Goal 4 (Week 2): Patient will demonstrate sustained attention to functional tasks for 30 minutes with Min verbal cues for redirection.  Skilled Therapeutic Interventions: Skilled treatment session focused on cognitive goals. SLP facilitated session by re-administering the Cognistat. Patient scored WFL on all subtests with the exception of mild impairments in calculations and registration. However, all scores improved since initial evaluation. Patient demonstrated sustained attention to the standardized assessment for 30 minutes independently and also recalled previous therapy events from the weekend and earlier in the day independently.  SLP also facilitated a functional conversation that focused on anticipatory awareness with home set-up and identifying tasks/activities she can participate in at home. Patient left upright in bed with alarm on and all needs within reach. Continue with current plan of care.    Pain No/Denies Pain   *Agitated Behavior Scale not Completed due to no unsafe behaviors documented since admission *  Therapy/Group: Individual Therapy  Daniela Siebers, Portal 06/02/2022, 3:01 PM

## 2022-06-02 NOTE — Progress Notes (Signed)
Physical Therapy Weekly Progress Note  Patient Details  Name: Jane Andrews MRN: [id number] Date of Birth: August 18, 1925  Beginning of progress report period: May 26, 2022 End of progress report period: June 02, 2022  Today's Date: 06/02/2022 PT Individual Time: 0847-1000 PT Individual Time Calculation (min): 73 min   Patient has met 4 of 4 short term goals.  Patient with steady progress this week with improved activity tolerance and outlook on recovery. Patient with a fall with nursing this week without serious injury, however, she did sustain bruising to her R ribs and calf and a laceration to her L forearm, no change in functional status following. She currently performs all mobility with min A-CGA with intermittent mod A for LOB secondary to ataxia. She is ambulating >100 feet with a RW and has performed 4x6" steps with B rails. Focusing on d/c planning, family education and hands on training, and functional mobility with compensatory strategies to reduce LOB in preparation for d/c at the end of the week.   Patient continues to demonstrate the following deficits muscle weakness, decreased cardiorespiratoy endurance, ataxia, decreased coordination, and decreased motor planning, vestibular symptoms of unspecified origin, and decreased sitting balance, decreased standing balance, decreased postural control, and decreased balance strategies and therefore will continue to benefit from skilled PT intervention to increase functional independence with mobility.  Patient progressing toward long term goals..  Continue plan of care.  PT Short Term Goals Week 2:  PT Short Term Goal 1 (Week 2): Patient will perform bed mobility with min A consistently. PT Short Term Goal 1 - Progress (Week 2): Met PT Short Term Goal 2 (Week 2): Patient will perform basic transfers with min A >50% of the time. PT Short Term Goal 2 - Progress (Week 2): Met PT Short Term Goal 3 (Week 2): Patient will initiate stair  training. PT Short Term Goal 3 - Progress (Week 2): Met PT Short Term Goal 4 (Week 2): Patient will ambulate >50 ft with min A using LRAD. PT Short Term Goal 4 - Progress (Week 2): Met Week 3:  PT Short Term Goal 1 (Week 3): STG=LTG due to ELOS.  Skilled Therapeutic Interventions/Progress Updates:     Patient in bed upon PT arrival. Patient alert and agreeable to PT session. Patient denied pain during session.  Patient requested to have a BM at beginning of session. Donned B thigh high TED hose with total A prior to mobility.  MD rounded during session, patient denies any new pain from her fall yesterday, reports R knee pain, baseline arthritis.   Therapeutic Activity: Bed Mobility: Patient performed supine to sit with supervision using bed rails with bed flat to simulate home set-up. Provided verbal cues for log roll technique and sequencing. Transfers: Patient performed stand pivot bed<>BSC and sit to/from stand x5 with min A-CGA using RW. Provided max verbal cues for hand placement, forward weight shift, pressing her toes into the ground to reduce posterior bias, completing turn before sitting, and reaching back to control descent. Patient was continent of bowl and bladder during toileting. Patient performed peri-care and lower body clothing management with total A in standing.    Gait Training:  Patient ambulated 164 feet and 114 feet using RW with CGA and intermittent min A. Ambulated with mild-moderate variable foot placement, improved BOS and control of stepping with cues. Provided verbal cues for increased BOS using RW as visual target and slowed cadence for increased control with stepping. Patient ascended/descended 4x6" steps using L rail  to simulate home set-up with min A-CGA. Performed step-to gait pattern leading with R while ascending and L while descending. Provided cues for technique and sequencing.   Neuromuscular Re-ed: Patient performed the following standing balance  activities: -standing at the sink >8 min performing hand hygiene, oral care, and combing her hair focused on reduced posterior bias, especially with dual task component without upper extremity support with min A-CGA throughout, provided cues for pushing her toes into the floor to shift her weight forward, attention to balance with dual task, and increased BOS to reduce postural sway  Patient in w/c in the room at end of session with breaks locked, seat belt alarm set, and all needs within reach.   Therapy Documentation Precautions:  Precautions Precautions: Fall, Cervical Precaution Booklet Issued: No Precaution Comments: verbal review of neck precautions, will need reinforcement Required Braces or Orthoses: Cervical Brace Cervical Brace: Soft collar Restrictions Weight Bearing Restrictions: No Other Position/Activity Restrictions: 2nd rib fx  Therapy/Group: Individual Therapy  Jane Andrews PT, DPT  06/02/2022, 4:13 PM

## 2022-06-02 NOTE — Consult Note (Addendum)
[place] Nurse Consult Note: Reason for Consult: Consult requested for left arm.  Pt has a full thickness skin tear to the left arm which has already been approximated with Steristrips.  Marland Kitchen2X6cm; no odor, drainage, or fluctuance. Dressing procedure/placement/frequency: Topical treatment orders provided for bedside nurses to perfrom as follows to promote healing: Apply xeroform gauze to left arm Q M/W/F, then cover with foam dressing. Leave Steristrips in place until they fall off. Please re-consult if further assistance is needed.  Thank-you,  Julien Girt MSN, [place], [place], [place], [place]

## 2022-06-02 NOTE — Progress Notes (Signed)
PROGRESS NOTE   Subjective/Complaints:  Had BM this am standing at sink brushing teeth with PT today  Fall unwitnessed , CT head improved, C spine and L spine with degenerative changes , no acute abnormalities   ROS: Patient denies CP, SOB, N/V/D   Objective:     CT HEAD WO CONTRAST (5MM)  Result Date: 06/01/2022 CLINICAL DATA:  88 year old female status post fall in hospital room yesterday. History of fall and small volume subarachnoid hemorrhage last month. EXAM: CT HEAD WITHOUT CONTRAST TECHNIQUE: Contiguous axial images were obtained from the base of the skull through the vertex without intravenous contrast. RADIATION DOSE REDUCTION: This exam was performed according to the departmental dose-optimization program which includes automated exposure control, adjustment of the mA and/or kV according to patient size and/or use of iterative reconstruction technique. COMPARISON:  Head CT 05/13/2022 and earlier. FINDINGS: Brain: Regressed but not completely resolved small volume subarachnoid hemorrhage and/or hemorrhagic contusion in the anterior left frontal lobe on series 3, image 14. No associated mass effect. Blood volume has not increased. No intraventricular or new intracranial hemorrhage elsewhere. Stable ventricle size and configuration. No midline shift, mass effect, or evidence of intracranial mass lesion. Stable gray-white matter differentiation throughout the brain. No cortically based acute infarct identified. Vascular: Calcified atherosclerosis at the skull base. Skull: No acute osseous abnormality identified. Sinuses/Orbits: Bilateral mastoid effusions not significantly changed. Visualized paranasal sinuses are stable and well aerated. Other: Right posterior scalp hematoma has regressed from last month. Stable and negative orbits. IMPRESSION: 1. Regressed but not resolved small volume subarachnoid hemorrhage and/or hemorrhagic  contusion in the anterior left frontal lobe since the CT last month. No associated mass effect. 2. No new intracranial abnormality or new traumatic injury identified. Electronically Signed   By: Genevie Ann M.D.   On: 06/01/2022 12:13   DG Cervical Spine 2 or 3 views  Result Date: 06/01/2022 CLINICAL DATA:  88 year old female status post fall in hospital room yesterday. EXAM: CERVICAL SPINE - 2-3 VIEW COMPARISON:  Cervical spine radiographs 08/24/2017. FINDINGS: Prevertebral soft tissue contour remains normal. Chronically abnormal, stable C2-C3 alignment with mild anterolisthesis not significantly changed since 2018. Preserved disc space there but superimposed advanced chronic disc space loss and endplate spurring X9-B7 through C6-C7. Cervicothoracic junction alignment is within normal limits. AP alignment, C1-C2 alignment within normal limits. No acute osseous abnormality identified. Negative visible upper chest. Right axillary surgical clips. IMPRESSION: 1. No acute osseous abnormality identified in the cervical spine. 2. Chronic cervical spine degeneration and C2-C3 spondylolisthesis has not significantly changed since 2018. Electronically Signed   By: Genevie Ann M.D.   On: 06/01/2022 12:07   DG Lumbar Spine 2-3 Views  Result Date: 06/01/2022 CLINICAL DATA:  Fall last night in hospital room.  Low back pain. EXAM: LUMBAR SPINE - 2-3 VIEW COMPARISON:  04/27/2019 FINDINGS: There is no evidence of lumbar spine fracture. Severe degenerative disc disease is again seen from L1 through S1. Mid and lower lumbar spine facet DJD is again seen, right side greater than left, with mild degenerative levoscoliosis. Grade 1 degenerative anterolisthesis at L5-S1 shows no significant change, measuring approximately 5 mm. Generalized osteopenia is also noted, however no  focal lytic or sclerotic bone lesions identified. Staghorn calculus again seen within the right renal collecting system. IMPRESSION: No acute findings. Stable  severe lumbar degenerative spondylosis, with grade 1 anterolisthesis at L5-S1 and levoscoliosis. Right renal staghorn calculus again noted. Electronically Signed   By: Marlaine Hind M.D.   On: 06/01/2022 10:38   Recent Labs    06/02/22 0603  WBC 7.9  HGB 10.4*  HCT 31.4*  PLT 201     Recent Labs    06/02/22 0603  NA 136  K 3.9  CL 111  CO2 16*  GLUCOSE 90  BUN 32*  CREATININE 1.52*  CALCIUM 9.0       Intake/Output Summary (Last 24 hours) at 06/02/2022 0916 Last data filed at 06/01/2022 2138 Gross per 24 hour  Intake 720 ml  Output --  Net 720 ml         Physical Exam: Vital Signs Blood pressure 114/62, pulse 74, temperature 98.2 F (36.8 C), resp. rate 16, height '5\' 1"'$  (1.549 m), weight 53.8 kg, SpO2 97 %.   General: No acute distress Mood and affect are appropriate Heart: Regular rate and rhythm no rubs murmurs or extra sounds Lungs: Clear to auscultation, breathing unlabored, no rales or wheezes Abdomen: Positive bowel sounds, soft nontender to palpation, nondistended Extremities: No clubbing, cyanosis, or edema Skin: No evidence of breakdown, no evidence of rash Neurologic: Cranial nerves II through XII intact, motor strength is 5/5 in bilateral deltoid, bicep, tricep, grip, hip flexor, knee extensors, ankle dorsiflexor and plantar flexor   Musculoskeletal: Full range of motion in all 4 extremities. No joint swelling, no pain with ROM x 4 ext      Assessment/Plan: 1. Functional deficits which require 3+ hours per day of interdisciplinary therapy in a comprehensive inpatient rehab setting. Physiatrist is providing close team supervision and 24 hour management of active medical problems listed below. Physiatrist and rehab team continue to assess barriers to discharge/monitor patient progress toward functional and medical goals  Care Tool:  Bathing    Body parts bathed by patient: Right arm, Left arm, Chest, Abdomen, Right upper leg, Left upper leg,  Face   Body parts bathed by helper: Front perineal area, Buttocks, Right lower leg, Left lower leg     Bathing assist Assist Level: Moderate Assistance - Patient 50 - 74%     Upper Body Dressing/Undressing Upper body dressing   What is the patient wearing?: Button up shirt    Upper body assist Assist Level: Moderate Assistance - Patient 50 - 74%    Lower Body Dressing/Undressing Lower body dressing      What is the patient wearing?: Incontinence brief, Pants     Lower body assist Assist for lower body dressing: Maximal Assistance - Patient 25 - 49%     Toileting Toileting    Toileting assist Assist for toileting: Moderate Assistance - Patient 50 - 74%     Transfers Chair/bed transfer  Transfers assist     Chair/bed transfer assist level: Minimal Assistance - Patient > 75% Chair/bed transfer assistive device: Programmer, multimedia   Ambulation assist      Assist level: Minimal Assistance - Patient > 75% Assistive device: Walker-rolling Max distance: 100 ft   Walk 10 feet activity   Assist  Walk 10 feet activity did not occur: Safety/medical concerns  Assist level: Minimal Assistance - Patient > 75% Assistive device: Walker-rolling   Walk 50 feet activity   Assist Walk 50 feet with 2  turns activity did not occur: Safety/medical concerns  Assist level: Minimal Assistance - Patient > 75% Assistive device: Walker-rolling    Walk 150 feet activity   Assist Walk 150 feet activity did not occur: Safety/medical concerns         Walk 10 feet on uneven surface  activity   Assist Walk 10 feet on uneven surfaces activity did not occur: Safety/medical concerns         Wheelchair     Assist Is the patient using a wheelchair?: Yes Type of Wheelchair: Manual    Wheelchair assist level: Dependent - Patient 0%      Wheelchair 50 feet with 2 turns activity    Assist        Assist Level: Dependent - Patient 0%    Wheelchair 150 feet activity     Assist      Assist Level: Dependent - Patient 0%   Blood pressure 114/62, pulse 74, temperature 98.2 F (36.8 C), resp. rate 16, height '5\' 1"'$  (1.549 m), weight 53.8 kg, SpO2 97 %.  . Medical Problem List and Plan: 1. Functional deficits secondary to Central cord syndrome as well as TBI (SAH/SDH)             -patient may shower, ordered [place] collar for shower             -ELOS/Goals: 06/06/22           -Continue CIR therapies including PT, OT, and SLP   2.  Antithrombotics: -DVT/anticoagulation:  Pharmaceutical: Lovenox started pm 06/02              -antiplatelet therapy: N/a due to bleed.  3. Pain: continue Tylenol QID, Cymbalta '90mg'$  daily, gabapentin, tramadol  -encouraged patient to massage area, avoid direct pressure when she can. It should improve with time 4. Mood/sleep: pt to provide ego support -pt is already on cymbalta and xanax as pta for anxiety disorder   -adjusted xanax to '1mg'$  qhs she uses at home which has helped sleep  - free T4 level ok, acutally a bit high - neuropsych requested -team/family continues to provide positive reinforcement and support              -antipsychotic agents: N/A 5. Neuropsych: This patient is capable of making decisions on her own behalf. 6. Skin/Wound Care: wound cdi. Still some associated sensitivity  7. Fluids/Electrolytes/Nutrition: encourage PO     -6/16 BUN  has improved back to baseline range   -dc IVF. Recheck bmet 6/19               6/18 Pending qMonday labs    Latest Ref Rng & Units 06/02/2022    6:03 AM 05/30/2022    5:16 AM 05/26/2022    5:28 AM  BMP  Glucose 70 - 99 mg/dL 90  91  91   BUN 8 - 23 mg/dL 32  29  36   Creatinine 0.44 - 1.00 mg/dL 1.52  1.50  1.53   Sodium 135 - 145 mmol/L 136  139  136   Potassium 3.5 - 5.1 mmol/L 3.9  3.9  3.7   Chloride 98 - 111 mmol/L 111  113  108   CO2 22 - 32 mmol/L '16  18  18   '$ Calcium 8.9 - 10.3 mg/dL 9.0  8.8  8.5     8.  Seizure  proph: keppra completed 9. L occipital calvarium Fx: Soft collar at all times with outpatient follow up w/Dr. Annette Stable  -  no ecchymosis or drainage              6/18 Site C/D/I 10. R- 2nd rib Fx: Encouraging IS 11. 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  -still having sensory sx in hands, LE's to lesser extent,no change 12. CKD stage IV: Followed by Dr. Theador Hawthorne Baseline SCr- -- Continue sodium bicarb -see #7 13. H/o UGIB/Iron deficiency anemia/HH: Add iron supplement. B12 reviewed and is stable.  6/18 Continue iron supplements 14. TURBT 04/16/2022 for invasive high grade bladder CA: h/o dysuria/frequency -palliative x[facility] at [facility]-- -sessions after therapy complete- pt worried about her endurance  16. 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  17. Vitamin D deficiency: started ergocalciferol 50,000U once per week for 7 weeks 18. BPPV: changed meclizine to tid prn  -vestibular treatment limited d/t cervical issues 19. S/p mechanical fall unwitnessed early morning on 06/01/22 Xrays of C spine and L spine show no acute abnormalities, CT head showed chronic small vessel disease no acute abnormalities     LOS: 16 days A FACE TO FACE EVALUATION WAS PERFORMED  Charlett Blake 06/02/2022, 9:16 AM

## 2022-06-02 NOTE — Progress Notes (Signed)
Occupational Therapy Session Note  Patient Details  Name: Jane Andrews MRN: [id number] Date of Birth: [birth year]  Today's Date: 06/02/2022 OT Individual Time: 1642-9037 OT Individual Time Calculation (min): 60 min    Short Term Goals: Week 2:  OT Short Term Goal 1 (Week 2): Pt will use built up handles as needed to complete grooming tasks with set up assist only OT Short Term Goal 2 (Week 2): Pt will complete a stand pivot transfer to the toilet with min A OT Short Term Goal 3 (Week 2): Pt will don pants with min A overall, AD as needed  Skilled Therapeutic Interventions/Progress Updates:    Pt greeted semi-reclined in bed and agreeable to OT treatment session. Pt agreeable to shower today. Skin tear and IV covered with seal skin. Pt then completed bed mobility with min A and min cues for cervical precautions. Pt ambulated into bathroom w/ RW and min A with an occasional LOB requiring min A to correct. Pt sat on tub bench for shower and was issued LH sponge to increase access to lower half of body. Pt ambulated out of shower in similar fashion. Fine motor coordination with buttoning task. Pt able to complete one button, but then reported fatigue. OT assist to thread pant legs, then worked on sit<>Stand and lateral punch to pull up pants, which she was able to do today with min A. Pt brought to the sink for hair druing task. Pt was able to maintain gasp on hair dryer with R hand, but demonstrated some weakness with keeping it the right distance from her head. She tolerated drying 50% of hair. Pt left seated in wc with alarm belt on, call bell in reach, and needs met.  Therapy Documentation Precautions:  Precautions Precautions: Fall, Cervical Precaution Booklet Issued: No Precaution Comments: verbal review of neck precautions, will need reinforcement Required Braces or Orthoses: Cervical Brace Cervical Brace: Soft collar Restrictions Weight Bearing Restrictions: No Other  Position/Activity Restrictions: 2nd rib fx Pain: Pain Assessment Pain Scale: 0-10 Pain Score: 0-No pain   Therapy/Group: Individual Therapy  Valma Cava 06/02/2022, 11:56 AM

## 2022-06-02 NOTE — Progress Notes (Signed)
Pt rested well through out the night. No new complaints at this time. Pt sleeping in bed, call light in reach, and bed alarm on.

## 2022-06-02 NOTE — Progress Notes (Signed)
**  LATE ENTRY**    06/01/22 0830  What Happened  Was fall witnessed? No  Was patient injured? Yes  Follow Up  MD notified Luetta Nutting, NP  Time MD notified [phone]  Additional tests Yes-comment  Simple treatment Dressing (vitals)  Adult Fall Risk Assessment  Risk Factor Category (scoring not indicated) Fall has occurred during this admission (document High fall risk);History of more than one fall within 6 months before admission (document High fall risk)  Patient Fall Risk Level High fall risk  Adult Fall Risk Interventions  Required Bundle Interventions *See Row Information* High fall risk - low, moderate, and high requirements implemented  Additional Interventions Use of appropriate toileting equipment (bedpan, BSC, etc.)  Screening for Fall Injury Risk (To be completed on HIGH fall risk patients) - Assessing Need for Floor Mats  Risk For Fall Injury- Criteria for Floor Mats 85 years or older;Previous fall this admission  Will Implement Floor Mats Yes  PCA/Epidural/Spinal Assessment  Respiratory Pattern Regular;Unlabored  Neurological  Neuro (WDL) X  Level of Consciousness Alert  Orientation Level Oriented X4  Cognition Appropriate at baseline  Speech Clear  R Hand Grip Moderate  L Hand Grip Moderate  R Foot Dorsiflexion Moderate  L Foot Dorsiflexion Moderate  R Foot Plantar Flexion Moderate  L Foot Plantar Flexion Moderate  RUE Motor Response Purposeful movement  RUE Sensation Numbness;Tingling  RUE Motor Strength 4  LUE Motor Response Purposeful movement  LUE Sensation Numbness;Tingling  LUE Motor Strength 4  RLE Motor Response Purposeful movement  RLE Sensation Numbness;Tingling  RLE Motor Strength 4  LLE Motor Response Purposeful movement  LLE Sensation Tingling;Numbness  LLE Motor Strength 4  Musculoskeletal  Musculoskeletal (WDL) X  Assistive Device Stedy  Generalized Weakness Yes  Weight Bearing Restrictions No  Musculoskeletal Details  Neck Ortho/Supportive  Device  Neck Ortho/Supportive Device Collar  Collar On and aligned;Soft  Integumentary  Integumentary (WDL) X  Skin Color Appropriate for ethnicity  Skin Condition Dry

## 2022-06-03 ENCOUNTER — Other Ambulatory Visit: Payer: Self-pay

## 2022-06-03 ENCOUNTER — Ambulatory Visit
Admit: 2022-06-03 | Discharge: 2022-06-03 | Disposition: A | Discharge status: Home / Self Care | Payer: Medicare Other | Attending: Radiation Oncology | Admitting: Radiation Oncology

## 2022-06-03 DIAGNOSIS — C670 Malignant neoplasm of trigone of bladder: Secondary | ICD-10-CM | POA: Diagnosis not present

## 2022-06-03 DIAGNOSIS — Z510 Encounter for antineoplastic radiation therapy: Secondary | ICD-10-CM | POA: Diagnosis not present

## 2022-06-03 DIAGNOSIS — M542 Cervicalgia: Secondary | ICD-10-CM

## 2022-06-03 DIAGNOSIS — S14129D Central cord syndrome at unspecified level of cervical spinal cord, subsequent encounter: Principal | ICD-10-CM

## 2022-06-03 DIAGNOSIS — N189 Chronic kidney disease, unspecified: Secondary | ICD-10-CM

## 2022-06-03 DIAGNOSIS — D509 Iron deficiency anemia, unspecified: Secondary | ICD-10-CM

## 2022-06-03 LAB — RAD ONC ARIA SESSION SUMMARY
Course Elapsed Days: 7
Plan Fractions Treated to Date: 6
Plan Prescribed Dose Per Fraction: 3.5 Gy
Plan Total Fractions Prescribed: 8
Plan Total Prescribed Dose: 28 Gy
Reference Point Dosage Given to Date: 21 Gy
Reference Point Session Dosage Given: 3.5 Gy
Session Number: 6

## 2022-06-03 MED ORDER — DICLOFENAC SODIUM 1 % EX GEL
4.0000 g | Freq: Four times a day (QID) | CUTANEOUS | Status: DC
  Administered 2022-06-03 – 2022-06-07 (×11): 4 g via TOPICAL
  Filled 2022-06-03 (×2): qty 100

## 2022-06-03 MED ORDER — DICLOFENAC SODIUM 1 % EX GEL: 2.0000 g | CUTANEOUS | Status: DC | PRN

## 2022-06-03 NOTE — Progress Notes (Addendum)
PROGRESS NOTE   Subjective/Complaints:  She had a fall over the weekend, feeling better today. No new concerns.  She continues to have frequent Bms.  ROS: Patient denies CP, SOB, N/V/D, HA   Objective:     CT HEAD WO CONTRAST (5MM)  Result Date: 06/01/2022 CLINICAL DATA:  88 year old female status post fall in hospital room yesterday. History of fall and small volume subarachnoid hemorrhage last month. EXAM: CT HEAD WITHOUT CONTRAST TECHNIQUE: Contiguous axial images were obtained from the base of the skull through the vertex without intravenous contrast. RADIATION DOSE REDUCTION: This exam was performed according to the departmental dose-optimization program which includes automated exposure control, adjustment of the mA and/or kV according to patient size and/or use of iterative reconstruction technique. COMPARISON:  Head CT 05/13/2022 and earlier. FINDINGS: Brain: Regressed but not completely resolved small volume subarachnoid hemorrhage and/or hemorrhagic contusion in the anterior left frontal lobe on series 3, image 14. No associated mass effect. Blood volume has not increased. No intraventricular or new intracranial hemorrhage elsewhere. Stable ventricle size and configuration. No midline shift, mass effect, or evidence of intracranial mass lesion. Stable gray-white matter differentiation throughout the brain. No cortically based acute infarct identified. Vascular: Calcified atherosclerosis at the skull base. Skull: No acute osseous abnormality identified. Sinuses/Orbits: Bilateral mastoid effusions not significantly changed. Visualized paranasal sinuses are stable and well aerated. Other: Right posterior scalp hematoma has regressed from last month. Stable and negative orbits. IMPRESSION: 1. Regressed but not resolved small volume subarachnoid hemorrhage and/or hemorrhagic contusion in the anterior left frontal lobe since the CT last  month. No associated mass effect. 2. No new intracranial abnormality or new traumatic injury identified. Electronically Signed   By: Genevie Ann M.D.   On: 06/01/2022 12:13   DG Cervical Spine 2 or 3 views  Result Date: 06/01/2022 CLINICAL DATA:  88 year old female status post fall in hospital room yesterday. EXAM: CERVICAL SPINE - 2-3 VIEW COMPARISON:  Cervical spine radiographs 08/24/2017. FINDINGS: Prevertebral soft tissue contour remains normal. Chronically abnormal, stable C2-C3 alignment with mild anterolisthesis not significantly changed since 2018. Preserved disc space there but superimposed advanced chronic disc space loss and endplate spurring G1-W2 through C6-C7. Cervicothoracic junction alignment is within normal limits. AP alignment, C1-C2 alignment within normal limits. No acute osseous abnormality identified. Negative visible upper chest. Right axillary surgical clips. IMPRESSION: 1. No acute osseous abnormality identified in the cervical spine. 2. Chronic cervical spine degeneration and C2-C3 spondylolisthesis has not significantly changed since 2018. Electronically Signed   By: Genevie Ann M.D.   On: 06/01/2022 12:07   DG Lumbar Spine 2-3 Views  Result Date: 06/01/2022 CLINICAL DATA:  Fall last night in hospital room.  Low back pain. EXAM: LUMBAR SPINE - 2-3 VIEW COMPARISON:  04/27/2019 FINDINGS: There is no evidence of lumbar spine fracture. Severe degenerative disc disease is again seen from L1 through S1. Mid and lower lumbar spine facet DJD is again seen, right side greater than left, with mild degenerative levoscoliosis. Grade 1 degenerative anterolisthesis at L5-S1 shows no significant change, measuring approximately 5 mm. Generalized osteopenia is also noted, however no focal lytic or sclerotic bone lesions identified. Staghorn calculus again seen  within the right renal collecting system. IMPRESSION: No acute findings. Stable severe lumbar degenerative spondylosis, with grade 1  anterolisthesis at L5-S1 and levoscoliosis. Right renal staghorn calculus again noted. Electronically Signed   By: Marlaine Hind M.D.   On: 06/01/2022 10:38   Recent Labs    06/02/22 0603  WBC 7.9  HGB 10.4*  HCT 31.4*  PLT 201      Recent Labs    06/02/22 0603  NA 136  K 3.9  CL 111  CO2 16*  GLUCOSE 90  BUN 32*  CREATININE 1.52*  CALCIUM 9.0       Intake/Output Summary (Last 24 hours) at 06/03/2022 1006 Last data filed at 06/03/2022 0300 Gross per 24 hour  Intake 220 ml  Output --  Net 220 ml         Physical Exam: Vital Signs Blood pressure 102/80, pulse 73, temperature 98 F (36.7 C), resp. rate 15, height '5\' 1"'$  (1.549 m), weight 53.8 kg, SpO2 99 %.   General: No acute distress, in bed Mood and affect are appropriate Heart: Regular rate and rhythm no rubs murmurs or extra sounds Lungs: Clear to auscultation, breathing unlabored, no rales or wheezes Abdomen: Positive bowel sounds, soft nontender to palpation, nondistended Extremities: No clubbing, cyanosis, or edema Skin: dressing over skin tear left arm clean and dry Neurologic: Cranial nerves II through XII intact, motor strength is 5/5 in bilateral deltoid, bicep, tricep, grip, hip flexor, knee extensors, ankle dorsiflexor and plantar flexor   Musculoskeletal: Full range of motion in all 4 extremities. No joint swelling, no pain with ROM x 4 ext      Assessment/Plan: 1. Functional deficits which require 3+ hours per day of interdisciplinary therapy in a comprehensive inpatient rehab setting. Physiatrist is providing close team supervision and 24 hour management of active medical problems listed below. Physiatrist and rehab team continue to assess barriers to discharge/monitor patient progress toward functional and medical goals  Care Tool:  Bathing    Body parts bathed by patient: Right arm, Left arm, Chest, Abdomen, Right upper leg, Left upper leg, Face   Body parts bathed by helper: Front  perineal area, Buttocks, Right lower leg, Left lower leg     Bathing assist Assist Level: Moderate Assistance - Patient 50 - 74%     Upper Body Dressing/Undressing Upper body dressing   What is the patient wearing?: Button up shirt    Upper body assist Assist Level: Moderate Assistance - Patient 50 - 74%    Lower Body Dressing/Undressing Lower body dressing      What is the patient wearing?: Incontinence brief, Pants     Lower body assist Assist for lower body dressing: Maximal Assistance - Patient 25 - 49%     Toileting Toileting    Toileting assist Assist for toileting: Moderate Assistance - Patient 50 - 74%     Transfers Chair/bed transfer  Transfers assist     Chair/bed transfer assist level: Minimal Assistance - Patient > 75% Chair/bed transfer assistive device: Programmer, multimedia   Ambulation assist      Assist level: Minimal Assistance - Patient > 75% Assistive device: Walker-rolling Max distance: 164 ft   Walk 10 feet activity   Assist  Walk 10 feet activity did not occur: Safety/medical concerns  Assist level: Minimal Assistance - Patient > 75% Assistive device: Walker-rolling   Walk 50 feet activity   Assist Walk 50 feet with 2 turns activity did not occur: Safety/medical concerns  Assist level: Minimal Assistance - Patient > 75% Assistive device: Walker-rolling    Walk 150 feet activity   Assist Walk 150 feet activity did not occur: Safety/medical concerns  Assist level: Minimal Assistance - Patient > 75% Assistive device: Walker-rolling    Walk 10 feet on uneven surface  activity   Assist Walk 10 feet on uneven surfaces activity did not occur: Safety/medical concerns         Wheelchair     Assist Is the patient using a wheelchair?: Yes Type of Wheelchair: Manual    Wheelchair assist level: Dependent - Patient 0%      Wheelchair 50 feet with 2 turns activity    Assist        Assist  Level: Dependent - Patient 0%   Wheelchair 150 feet activity     Assist      Assist Level: Dependent - Patient 0%   Blood pressure 102/80, pulse 73, temperature 98 F (36.7 C), resp. rate 15, height '5\' 1"'$  (1.549 m), weight 53.8 kg, SpO2 99 %.  . Medical Problem List and Plan: 1. Functional deficits secondary to Central cord syndrome as well as TBI (SAH/SDH)             -patient may shower, ordered [place] collar for shower             -ELOS/Goals: 06/06/22           -Continue CIR therapies including PT, OT, and SLP    -Team Conference today 2.  Antithrombotics: -DVT/anticoagulation:  Pharmaceutical: Lovenox started pm 06/02              -antiplatelet therapy: N/a due to bleed.  3. Pain: continue Tylenol QID, Cymbalta '90mg'$  daily, gabapentin, tramadol  -encouraged patient to massage area, avoid direct pressure when she can. It should improve with time  -Pain well controlled 6/20 4. Mood/sleep: pt to provide ego support -pt is already on cymbalta and xanax as pta for anxiety disorder   -adjusted xanax to '1mg'$  qhs she uses at home which has helped sleep  - free T4 level ok, acutally a bit high - neuropsych requested -team/family continues to provide positive reinforcement and support              -antipsychotic agents: N/A 5. Neuropsych: This patient is capable of making decisions on her own behalf. 6. Skin/Wound Care: wound cdi. Still some associated sensitivity  7. Fluids/Electrolytes/Nutrition: encourage PO     -6/16 BUN  has improved back to baseline range   -dc IVF. Recheck bmet 6/19               6/18 Pending qMonday labs  6/20 Labs yesterday with Cr stable at 1.52    Latest Ref Rng & Units 06/02/2022    6:03 AM 05/30/2022    5:16 AM 05/26/2022    5:28 AM  BMP  Glucose 70 - 99 mg/dL 90  91  91   BUN 8 - 23 mg/dL 32  29  36   Creatinine 0.44 - 1.00 mg/dL 1.52  1.50  1.53   Sodium 135 - 145 mmol/L 136  139  136   Potassium 3.5 - 5.1 mmol/L 3.9  3.9  3.7   Chloride  98 - 111 mmol/L 111  113  108   CO2 22 - 32 mmol/L '16  18  18   '$ Calcium 8.9 - 10.3 mg/dL 9.0  8.8  8.5     8.  Seizure proph: keppra completed  9. L occipital calvarium Fx: Soft collar at all times with outpatient follow up w/Dr. Annette Stable  -no ecchymosis or drainage              6/18 Site C/D/I 10. R- 2nd rib Fx: Encouraging IS 11. 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  -still having sensory sx in hands, LE's to lesser extent,no change 12. CKD stage IV: Followed by Dr. Theador Hawthorne Baseline SCr- -- Continue sodium bicarb -Cr stable at 1.52 on labs 6/19, continue to monitor 13. H/o UGIB/Iron deficiency anemia/HH: Add iron supplement. B12 reviewed and is stable.  6/18 Continue iron supplements 6/20 HGB 10.4 improved, follow 14. TURBT 04/16/2022 for invasive high grade bladder CA: h/o dysuria/frequency -palliative xrt at [facility]-- -sessions after therapy complete- pt worried about her endurance  16. 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  17. Vitamin D deficiency: started ergocalciferol 50,000U once per week for 7 weeks 18. BPPV: changed meclizine to tid prn  -vestibular treatment limited d/t cervical issues 19. S/p mechanical fall unwitnessed early morning on 06/01/22 Xrays of C spine and L spine show no acute abnormalities, CT head showed chronic small vessel disease no acute abnormalities     LOS: 17 days A FACE TO FACE EVALUATION WAS PERFORMED  Jennye Boroughs 06/03/2022, 10:06 AM

## 2022-06-03 NOTE — Patient Care Conference (Signed)
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 Jennye Boroughs Social Worker Present: Loralee Pacas, [place] Nurse Present: Dorthula Nettles, RN PT Present: Apolinar Junes, PT OT Present: Laverle Hobby, OT SLP Present: Weston Anna, SLP PPS Coordinator present : Gunnar Fusi, SLP     Current Status/Progress Goal Weekly Team Focus  Bowel/Bladder   continent b/b, lbm 6/19  to remain continent  time toileting q 2 hr and prn   Swallow/Nutrition/ Hydration             ADL's   Mod/max A LB ADLs, work on Research scientist (life sciences), Min A UB BADLs, Min A transfers w/ RW  CGA/supervision  seld-care reatining, fine motor coordination, dc planning, pt/family education   Mobility   CGA-min A overall with intermittent mod A for LOB due to ataxia, gait >150 ft with RW, 4 steps with 1 rail  CGA-min A overall  Balance, functional mobility, motor planning, gait and stair training, activity tolerance, sensory integration, safety awareness, patient/caregiver education, d/c planning   Communication   Supervision  Supervision  utilization of word-finding strategies   Safety/Cognition/ Behavioral Observations  Supervision-Min A  Supervision  Family Education   Pain   pain controlled  < 3  assess q 4hr and prn   Skin   skin tear  from fall over the weekend  no new breakdown  assess skin q shift and prn     Discharge Planning:  DC to home with support from her niece [name] (retiring at the end of the month) and hired caregiver support. Pt begins radiation treatment 6/12-6/22.   Team Discussion: Family inquiring about fall over the weekend, patient not at fault. Patient did receive skin tear to arm and steri-strips place. CKD at baseline. Continent B/B, pain controlled. Does report chronic right knee pain. Discharging home, family education tomorrow. Making good progress.  Patient on target to meet rehab goals: yes, CGA to supervision goals. Currently mod/max assist lower body ADL's, min assist upper body, transfers with RW. Using assistive equipment. Does have loss of balance to left intermittently. Supervision for recall, basic problem solving.  *See Care Plan and progress notes for long and short-term goals.   Revisions to Treatment Plan:  None at this time.   Teaching Needs: Family education, medication/pain management, skin/wound care, transfer/gait training, etc.   Current Barriers to Discharge: Decreased caregiver support, Home enviroment access/layout, Wound care, and Pending chemo/radiation  Possible Resolutions to Barriers: Family education Follow-up therapy Continue chemo     Medical Summary Current Status: CKD, falls, anemia, Diarrhea, cervical stenosis, pain  Barriers to Discharge: Home enviroment access/layout;Medical stability  Barriers to Discharge Comments: CKD, falls, anemia, Diarrhea, cervical stenosis, pain Possible Resolutions to [employer] Focus: follow labs, monitor fluid intak, monitor bowel and bladder fx,   Continued Need  for Acute Rehabilitation Level of Care: The patient requires daily medical management by a physician with specialized training in physical medicine and rehabilitation for the following reasons: Direction of a multidisciplinary physical rehabilitation program to  maximize functional independence : Yes Medical management of patient stability for increased activity during participation in an intensive rehabilitation regime.: Yes Analysis of laboratory values and/or radiology reports with any subsequent need for medication adjustment and/or medical intervention. : Yes   I attest that I was present, lead the team conference, and concur with the assessment and plan of the team.   Cristi Loron 06/03/2022, 12:25 PM

## 2022-06-03 NOTE — Plan of Care (Signed)
Goals downgraded d/t slow pt progress and for pt safety Problem: RH Balance Goal: LTG Patient will maintain dynamic standing with ADLs (OT) Description: LTG:  Patient will maintain dynamic standing balance with assist during activities of daily living (OT)  Flowsheets (Taken 06/03/2022 1003) LTG: Pt will maintain dynamic standing balance during ADLs with: (downgraded 6/20- SD) Minimal Assistance - Patient > 75%   Problem: Sit to Stand Goal: LTG:  Patient will perform sit to stand in prep for activites of daily living with assistance level (OT) Description: LTG:  Patient will perform sit to stand in prep for activites of daily living with assistance level (OT) Flowsheets (Taken 06/03/2022 1003) LTG: PT will perform sit to stand in prep for activites of daily living with assistance level: (downgraded 6/20- SD) Minimal Assistance - Patient > 75%   Problem: RH Bathing Goal: LTG Patient will bathe all body parts with assist levels (OT) Description: LTG: Patient will bathe all body parts with assist levels (OT) Flowsheets (Taken 06/03/2022 1003) LTG: Pt will perform bathing with assistance level/cueing: (downgraded 6/20- SD) Minimal Assistance - Patient > 75%   Problem: RH Dressing Goal: LTG Patient will perform lower body dressing w/assist (OT) Description: LTG: Patient will perform lower body dressing with assist, with/without cues in positioning using equipment (OT) Flowsheets (Taken 06/03/2022 1003) LTG: Pt will perform lower body dressing with assistance level of: (downgraded 6/20- SD) Minimal Assistance - Patient > 75%   Problem: RH Toilet Transfers Goal: LTG Patient will perform toilet transfers w/assist (OT) Description: LTG: Patient will perform toilet transfers with assist, with/without cues using equipment (OT) Flowsheets (Taken 06/03/2022 1003) LTG: Pt will perform toilet transfers with assistance level of: (downgraded 6/20- SD) Minimal Assistance - Patient > 75%   Problem: RH  Tub/Shower Transfers Goal: LTG Patient will perform tub/shower transfers w/assist (OT) Description: LTG: Patient will perform tub/shower transfers with assist, with/without cues using equipment (OT) Flowsheets (Taken 06/03/2022 1003) LTG: Pt will perform tub/shower stall transfers with assistance level of: (downgraded 6/20- SD) Minimal Assistance - Patient > 75%

## 2022-06-03 NOTE — Progress Notes (Signed)
Occupational Therapy Weekly Progress Note  Patient Details  Name: Jane Andrews MRN: [id number] Date of Birth: 02-01-1925  Beginning of progress report period: May 28, 2022 End of progress report period: June 03, 2022  Today's Date: 06/03/2022 OT Individual Time: 9432-7614 OT Individual Time Calculation (min): 32 min    Patient has met 2 of 3 short term goals.  Dot continues to make progress towards goals. She can now complete UB ADLs with Min A and LB ADLs with Mod A. Transfers have improved to Min A/CGA consistently. Family education is scheduled for 6/23 with pts niece.   Patient continues to demonstrate the following deficits: muscle weakness, ataxia and decreased coordination, and decreased standing balance, decreased postural control, and decreased balance strategies and therefore will continue to benefit from skilled OT intervention to enhance overall performance with BADL.  Patient progressing toward long term goals..  Continue plan of care. Several goals downgraded to Min A to reflect pt need for increased safety.  OT Short Term Goals Week 2:  OT Short Term Goal 1 (Week 2): Pt will use built up handles as needed to complete grooming tasks with set up assist only OT Short Term Goal 1 - Progress (Week 2): Met OT Short Term Goal 2 (Week 2): Pt will complete a stand pivot transfer to the toilet with min A OT Short Term Goal 2 - Progress (Week 2): Met OT Short Term Goal 3 (Week 2): Pt will don pants with min A overall, AD as needed OT Short Term Goal 3 - Progress (Week 2): Progressing toward goal  Skilled Therapeutic Interventions/Progress Updates:    Pt received sitting in recliner and agreeable to OT session. Discussed discharge and family education with pt who is apprehensive to return home d/t fear of current function. Pt voicing depression and strong feelings of inadequacy with her current status. Therapist provided verbal encouragement of pt progress and current  functioning. Directed the rest of the session on [facility] to increase independence in ADL tasks including manipulating buttons, grasping utensils/toothbrush, and manipulating clothing. Discussed starting a bowel program and will f/u about this with staff. Pt requesting to lay in bed d/t pain in her tailbone from sitting in recliner. Pt left in bed with HOB elevated, bed alarm on, call bell in reach, and all needs met.   Therapy Documentation Precautions:  Precautions Precautions: Fall, Cervical Precaution Booklet Issued: No Precaution Comments: verbal review of neck precautions, will need reinforcement Required Braces or Orthoses: Cervical Brace Cervical Brace: Soft collar Restrictions Weight Bearing Restrictions: No Other Position/Activity Restrictions: 2nd rib fx   Therapy/Group: Individual   Catalina Lunger 06/03/2022, 12:21 PM

## 2022-06-03 NOTE — Discharge Summary (Incomplete)
Physician Discharge Summary  Patient ID: Jane Andrews MRN: [id number] DOB/AGE: October 13, 1925 88 y.o.  Admit date: 05/17/2022 Discharge date: 06/04/2022  Discharge Diagnoses:  Principal Problem:   Central cord syndrome of cervical spinal cord [facility]) Active Problems:   Chronic diarrhea   CKD (chronic kidney disease) stage 4, GFR 15-29 ml/min (HCC)   IDA (iron deficiency anemia)   TBI (traumatic brain injury) ([place])   Discharged Condition: stable  Significant Diagnostic Studies: CT HEAD WO CONTRAST (5MM)  Result Date: 06/01/2022 CLINICAL DATA:  88 year old female status post fall in hospital room yesterday. History of fall and small volume subarachnoid hemorrhage last month. EXAM: CT HEAD WITHOUT CONTRAST TECHNIQUE: Contiguous axial images were obtained from the base of the skull through the vertex without intravenous contrast. RADIATION DOSE REDUCTION: This exam was performed according to the departmental dose-optimization program which includes automated exposure control, adjustment of the mA and/or kV according to patient size and/or use of iterative reconstruction technique. COMPARISON:  Head CT 05/13/2022 and earlier. FINDINGS: Brain: Regressed but not completely resolved small volume subarachnoid hemorrhage and/or hemorrhagic contusion in the anterior left frontal lobe on series 3, image 14. No associated mass effect. Blood volume has not increased. No intraventricular or new intracranial hemorrhage elsewhere. Stable ventricle size and configuration. No midline shift, mass effect, or evidence of intracranial mass lesion. Stable gray-white matter differentiation throughout the brain. No cortically based acute infarct identified. Vascular: Calcified atherosclerosis at the skull base. Skull: No acute osseous abnormality identified. Sinuses/Orbits: Bilateral mastoid effusions not significantly changed. Visualized paranasal sinuses are stable and well aerated. Other: Right posterior scalp hematoma  has regressed from last month. Stable and negative orbits. IMPRESSION: 1. Regressed but not resolved small volume subarachnoid hemorrhage and/or hemorrhagic contusion in the anterior left frontal lobe since the CT last month. No associated mass effect. 2. No new intracranial abnormality or new traumatic injury identified. Electronically Signed   By: Genevie Ann M.D.   On: 06/01/2022 12:13   DG Cervical Spine 2 or 3 views  Result Date: 06/01/2022 CLINICAL DATA:  88 year old female status post fall in hospital room yesterday. EXAM: CERVICAL SPINE - 2-3 VIEW COMPARISON:  Cervical spine radiographs 08/24/2017. FINDINGS: Prevertebral soft tissue contour remains normal. Chronically abnormal, stable C2-C3 alignment with mild anterolisthesis not significantly changed since 2018. Preserved disc space there but superimposed advanced chronic disc space loss and endplate spurring V2-Z3 through C6-C7. Cervicothoracic junction alignment is within normal limits. AP alignment, C1-C2 alignment within normal limits. No acute osseous abnormality identified. Negative visible upper chest. Right axillary surgical clips. IMPRESSION: 1. No acute osseous abnormality identified in the cervical spine. 2. Chronic cervical spine degeneration and C2-C3 spondylolisthesis has not significantly changed since 2018. Electronically Signed   By: Genevie Ann M.D.   On: 06/01/2022 12:07   DG Lumbar Spine 2-3 Views  Result Date: 06/01/2022 CLINICAL DATA:  Fall last night in hospital room.  Low back pain. EXAM: LUMBAR SPINE - 2-3 VIEW COMPARISON:  04/27/2019 FINDINGS: There is no evidence of lumbar spine fracture. Severe degenerative disc disease is again seen from L1 through S1. Mid and lower lumbar spine facet DJD is again seen, right side greater than left, with mild degenerative levoscoliosis. Grade 1 degenerative anterolisthesis at L5-S1 shows no significant change, measuring approximately 5 mm. Generalized osteopenia is also noted, however no focal  lytic or sclerotic bone lesions identified. Staghorn calculus again seen within the right renal collecting system. IMPRESSION: No acute findings. Stable severe lumbar degenerative spondylosis, with grade  1 anterolisthesis at L5-S1 and levoscoliosis. Right renal staghorn calculus again noted. Electronically Signed   By: Marlaine Hind M.D.   On: 06/01/2022 10:38   DG CHEST PORT 1 VIEW  Result Date: 05/16/2022 CLINICAL DATA:  Rib fractures. EXAM: PORTABLE CHEST 1 VIEW COMPARISON:  05/13/2022 and older exams. FINDINGS: Cardiac silhouette is normal in size and configuration. No mediastinal or hilar masses. Clear lungs.  No convincing pleural effusion and no pneumothorax. No evidence of rib fracture. Specifically, the suspected fracture of the right lateral second rib on shoulder radiographs from 05/13/2022 is not evident. No bone lesions. Stable changes from right breast surgery. IMPRESSION: 1. No active cardiopulmonary disease. 2. No evidence of a rib fracture. Electronically Signed   By: Lajean Manes M.D.   On: 05/16/2022 10:29   MR CERVICAL SPINE WO CONTRAST  Result Date: 05/16/2022 CLINICAL DATA:  Initial evaluation for spinal stenosis. EXAM: MRI CERVICAL SPINE WITHOUT CONTRAST TECHNIQUE: Multiplanar, multisequence MR imaging of the cervical spine was performed. No intravenous contrast was administered. COMPARISON:  CT from 05/13/2022. FINDINGS: Alignment: Straightening with reversal of the normal cervical lordosis. 2 mm anterolisthesis of C2 on C3, 3 mm retrolisthesis of C3 on C4, with 2 mm retrolisthesis of C4 on C5. Findings likely chronic and degenerative. Vertebrae: Vertebral body height maintained without acute or chronic fracture. Bone marrow signal intensity within normal limits. No discrete or worrisome osseous lesions. No abnormal marrow edema. Cord: Normal signal and morphology. No convincing cord signal changes on this mildly motion degraded exam. Posterior Fossa, vertebral arteries, paraspinal  tissues: Chronic microvascular ischemic disease noted within the pons. Visualized brain otherwise unremarkable. Craniocervical junction normal. Mild edema noted within the posterior paraspinous soft tissues of the neck at the level of C4-5, suspected to reflect mild soft tissue injury related to recent fall. Paraspinous soft tissues demonstrate no other acute finding. Normal flow voids seen within the vertebral arteries bilaterally. Upon review of prior CT, note is made of a thin hairline fracture involving the left occipital calvarium extending towards the jugular foramen (series 3, image 16 on prior head CT). Disc levels: C2-C3: Anterolisthesis. No significant disc bulge. Left-sided facet arthrosis. No significant spinal stenosis. Foramina remain patent. C3-C4: Retrolisthesis with degenerative intervertebral disc space narrowing. Broad posterior disc osteophyte flattens and effaces the ventral thecal sac. Superimposed facet and ligament flavum hypertrophy. Secondary cord flattening without definite cord signal changes. Severe spinal stenosis, with the thecal sac measuring 6 mm in AP diameter at its most narrow point. Severe bilateral C4 foraminal stenosis. C4-C5: Moderate degenerative intervertebral disc space narrowing with diffuse disc osteophyte complex. Broad posterior component flattens and partially faces the ventral thecal sac. Superimposed facet and ligament flavum hypertrophy. Resultant moderate to severe spinal stenosis. Mild cord flattening without cord signal changes. Moderate to severe left with moderate right C5 foraminal stenosis. C5-C6: Degenerative intervertebral disc space narrowing with diffuse disc osteophyte complex. Broad posterior component flattens and partially effaces the ventral thecal sac, eccentric to the left. Mild cord flattening without cord signal changes. Mild spinal stenosis. Moderate right with mild-to-moderate left C6 foraminal narrowing. C6-C7: Degenerative intervertebral disc  space narrowing with diffuse disc bulge. Left greater than right uncovertebral spurring. Bilateral facet hypertrophy. No significant spinal stenosis. Mild left C7 foraminal narrowing. Right neural foramen remains patent. C7-T1: Negative interspace. Right worse than left facet hypertrophy. No spinal stenosis. Mild right C8 foraminal narrowing. Left neural foramen is patent. Visualized upper thoracic spine demonstrates no significant finding. IMPRESSION: 1. Mild edema within the  posterior paraspinous soft tissues of the upper cervical spine, likely mild soft tissue injury given the history of recent trauma. 2. No other acute abnormality within the cervical spine. 3. Degenerative spondylosis at C3-4 and C4-5 with resultant moderate to severe spinal stenosis, most pronounced at C3-4. Secondary cord flattening without cord signal changes. Associated moderate to severe bilateral C4 through C6 foraminal narrowing as above. 4. Upon review of prior CT, note is made of a thin hairline fracture involving the left occipital calvarium extending towards the jugular foramen. Electronically Signed   By: Jeannine Boga M.D.   On: 05/16/2022 03:44   CT Cervical Spine Wo Contrast  Result Date: 05/13/2022 CLINICAL DATA:  Neck trauma. Additional history provided: Patient found on floor at home by neighbor. Laceration to back of head. EXAM: CT CERVICAL SPINE WITHOUT CONTRAST TECHNIQUE: Multidetector CT imaging of the cervical spine was performed without intravenous contrast. Multiplanar CT image reconstructions were also generated. RADIATION DOSE REDUCTION: This exam was performed according to the departmental dose-optimization program which includes automated exposure control, adjustment of the mA and/or kV according to patient size and/or use of iterative reconstruction technique. COMPARISON:  Radiographs of the cervical spine 08/24/2017. Cervical spine CT 08/24/2017. FINDINGS: Alignment: Slight cervical dextrocurvature. 2 mm  C2-C3 grade 1 anterolisthesis. 4 mm C3-C4 grade 1 retrolisthesis. Skull base and vertebrae: The basion-dental and atlanto-dental intervals are maintained.No evidence of acute fracture to the cervical spine. Soft tissues and spinal canal: No prevertebral fluid or swelling. No visible canal hematoma. Disc levels: Cervical spondylosis with multilevel disc space narrowing, disc bulges, posterior disc osteophytes, endplate spurring, uncovertebral hypertrophy and facet arthrosis. Disc degeneration is greatest at C3-C4, C4-C5, C5-C6 and C6-C7. Multilevel spinal canal stenosis. Most notably, there is apparent at least moderate spinal canal stenosis at C3-C4. Multilevel bony neural foraminal narrowing. Upper chest: No consolidation within the imaged lung apices. No visible pneumothorax. IMPRESSION: No evidence of acute fracture to the cervical spine. 2 mm grade 1 anterolisthesis at C2-C3. 4 mm grade 1 retrolisthesis at C3-C4. Cervical spondylosis, as described. Multilevel spinal canal stenosis. Notably, there appears to be at least moderate spinal canal stenosis at C3-C4. Multilevel bony neural foraminal narrowing. Disc degeneration is advanced at C3-C4, C4-C5, C5-C6 and C6-C7. Electronically Signed   By: Kellie Simmering D.O.   On: 05/13/2022 18:10   CT Head Wo Contrast  Result Date: 05/13/2022 CLINICAL DATA:  Provided history: Head trauma, minor. Additional history provided by scanning technologist: Patient found on floor at home by neighbor. Laceration to back of head. EXAM: CT HEAD WITHOUT CONTRAST TECHNIQUE: Contiguous axial images were obtained from the base of the skull through the vertex without intravenous contrast. RADIATION DOSE REDUCTION: This exam was performed according to the departmental dose-optimization program which includes automated exposure control, adjustment of the mA and/or kV according to patient size and/or use of iterative reconstruction technique. COMPARISON:  Prior head CT examinations  02/10/2022 and earlier. FINDINGS: Brain: Mild generalized cerebral atrophy. Small-volume acute subarachnoid hemorrhage overlying the anterolateral left frontal lobe. Small volume subarachnoid hemorrhage is also present along the anteroinferior frontal lobes, bilaterally. Superimposed hemorrhagic parenchymal contusions at these sites cannot be excluded. Small hemorrhagic parenchymal contusions within the anterior right temporal lobe, measuring up to 9 mm, with overlying small volume acute subarachnoid hemorrhage. Thin acute subdural hemorrhage layering along the left tentorium, measuring up to 3 mm in thickness. Moderate patchy and ill-defined hypoattenuation within the cerebral white matter, nonspecific but compatible with chronic small vessel ischemic disease. No demarcated  cortical infarct. No evidence of an intracranial mass. No midline shift. Vascular: No hyperdense vessel. Atherosclerotic calcifications Skull: No fracture or aggressive osseous lesion. Sinuses/Orbits: No mass or acute finding within the imaged orbits. Small osteoma within a right ethmoid air cell. Minimal mucosal thickening within a left ethmoid air cell. Other: Bilateral mastoid effusions. Right parietooccipital scalp hematoma. These results were called by telephone at the time of interpretation on 05/13/2022 at 6:01 pm to provider Campbell Stall , who verbally acknowledged these results. IMPRESSION: Small-volume acute subarachnoid hemorrhage overlying the anterolateral left frontal lobe. Small-volume acute subarachnoid hemorrhage also present along the anteroinferior frontal lobes, bilaterally. Superimposed hemorrhagic parenchymal contusions at these sites cannot be excluded. Small hemorrhagic parenchymal contusions within the anterior right temporal lobe (measuring up to 9 mm) with overlying small-volume acute subarachnoid hemorrhage. Thin acute subdural hemorrhage along the left tentorium, measuring up to 3 mm in thickness. Right  parietooccipital scalp hematoma. Moderate chronic small vessel ischemic changes within the cerebral white matter. Mild generalized cerebral atrophy. Bilateral mastoid effusions. Electronically Signed   By: Kellie Simmering D.O.   On: 05/13/2022 18:02   CT Thoracic Spine Wo Contrast  Result Date: 05/13/2022 CLINICAL DATA:  Found down by neighbor. EXAM: CT THORACIC SPINE WITHOUT CONTRAST TECHNIQUE: Multidetector CT images of the thoracic were obtained using the standard protocol without intravenous contrast. RADIATION DOSE REDUCTION: This exam was performed according to the departmental dose-optimization program which includes automated exposure control, adjustment of the mA and/or kV according to patient size and/or use of iterative reconstruction technique. COMPARISON:  PET-CT dated May 01, 2022. Chest x-ray dated February 08, 2022. CT chest dated August 24, 2017. FINDINGS: Alignment: No traumatic malalignment. Vertebrae: No acute fracture or focal pathologic process. Paraspinal and other soft tissues: Old, partially healed nondisplaced fracture of the posterior left seventh rib. Incompletely visualized right renal staghorn calculus. Disc levels: Mild degenerative disc disease of the mid to lower thoracic spine. No evident high-grade spinal canal or neuroforaminal stenosis. IMPRESSION: 1. No acute osseous abnormality. Electronically Signed   By: Titus Dubin M.D.   On: 05/13/2022 18:02   DG Shoulder Right  Result Date: 05/13/2022 CLINICAL DATA:  Right arm pain after fall EXAM: RIGHT SHOULDER - 2+ VIEW COMPARISON:  06/14/2020 FINDINGS: Suspected calcific rotator cuff tendinopathy with superior subluxation of the humeral head with respect to the glenoid, but without dislocation. Suspected fracture of the right second rib laterally. IMPRESSION: 1. Suspected fracture of the right second rib laterally. 2. Mildly superiorly subluxed humeral head, which can sometimes be seen in setting of a chronic rotator cuff  tear. There is a suggestion of calcific supraspinatus tendinopathy. No overt dislocation. Electronically Signed   By: Van Clines M.D.   On: 05/13/2022 17:37   DG Humerus Right  Result Date: 05/13/2022 CLINICAL DATA:  Trauma, pain EXAM: RIGHT HUMERUS - 2+ VIEW COMPARISON:  None Available. FINDINGS: No recent fracture or dislocation is seen. There is decreased distance between humeral head and acromion. There is widening of right AC joint space measuring 7 mm. There is no increase in coracoclavicular distance. Surgical clips are seen in the right axilla. IMPRESSION: No recent fracture or dislocation is seen in the right humerus. Decreased distance between humeral head and acromion may suggest chronic tear of rotator cuff. There is some widening of right AC joint space, possibly suggesting a normal variation or right AC separation. Electronically Signed   By: Elmer Picker M.D.   On: 05/13/2022 17:34   DG Chest 1  View  Result Date: 05/13/2022 CLINICAL DATA:  Fall EXAM: CHEST  1 VIEW COMPARISON:  02/10/2022 FINDINGS: Lungs are clear.  No pleural effusion or pneumothorax. The heart is normal in size. Surgical clips along the right chest wall/axilla. IMPRESSION: No evidence of acute cardiopulmonary disease. Electronically Signed   By: Julian Hy M.D.   On: 05/13/2022 17:34   DG Pelvis 1-2 Views  Result Date: 05/13/2022 CLINICAL DATA:  Fall EXAM: PELVIS - 1-2 VIEW COMPARISON:  None Available. FINDINGS: Right hip arthroplasty, without evidence of complication. Left hip joint space is preserved. Visualized bony pelvis appears intact. Degenerative changes of the lower lumbar spine. IMPRESSION: Negative. Electronically Signed   By: Julian Hy M.D.   On: 05/13/2022 17:33    Labs:  Basic Metabolic Panel:    Latest Ref Rng & Units 06/02/2022    6:03 AM 05/30/2022    5:16 AM 05/26/2022    5:28 AM  BMP  Glucose 70 - 99 mg/dL 90  91  91   BUN 8 - 23 mg/dL 32  29  36   Creatinine 0.44  - 1.00 mg/dL 1.52  1.50  1.53   Sodium 135 - 145 mmol/L 136  139  136   Potassium 3.5 - 5.1 mmol/L 3.9  3.9  3.7   Chloride 98 - 111 mmol/L 111  113  108   CO2 22 - 32 mmol/L '16  18  18   '$ Calcium 8.9 - 10.3 mg/dL 9.0  8.8  8.5      CBC:    Latest Ref Rng & Units 06/02/2022    6:03 AM 05/29/2022    5:40 AM 05/26/2022    5:28 AM  CBC  WBC 4.0 - 10.5 K/uL 7.9  8.7  7.2   Hemoglobin 12.0 - 15.0 g/dL 10.4  9.6  9.5   Hematocrit 36.0 - 46.0 % 31.4  29.2  29.6   Platelets 150 - 400 K/uL 201  195  179      CBG: No results for input(s): "GLUCAP" in the last 168 hours.  Brief HPI:   Jane Andrews is a 88 y.o. female with history of AF, right breast cancer, CKD IV, right staghorn calculus, TURBT for bladder cancer, short gut syndrome who was admitted on 05/13/22 after found on the floor by a neighbor with laceration on the back of her head. She was found to have TBI with reports of HA, and dizziness with head turns. Work up revealed bilateral frontal SAH, right temporal ICH and left tentorium SDH, right 2nd rib fracture and severe stenosis C3/4 with advanced DDD C3-C7.  She reported numbness and tingling bilateral hands with sandpaper feeling and C spine MRI showed thin hairline fracture involving left occipital calvarium and extending towards jugular foramen as well as moderate to severe stenosis C3/4 and C4/5 with flattening of cord without signal changes.   Dr. Trenton Gammon recommended conservative management of [place] and C collar for support.  Mild weakness in grips with patchy sensory loss felt to be secondary to myelopathy in setting of severe spinal stenosis. Palliative care consulted and patient elected on DNR. She continues to be limited by pain, ataxic gait with left lean, decreased recall with problems sequencing as well as impaired sensation bilateral feet. PT/OT has been working with patient and CIR recommended due to functional decline.    Hospital Course: Jane Andrews was  admitted to rehab 05/17/2022 for inpatient therapies to consist of PT, ST and OT at least three  hours five days a week. Past admission physiatrist, therapy team and rehab RN have worked together to provide customized collaborative inpatient rehab.   Blood pressures were monitored on TID basis and  Follow up CBC showed anemia of chronic disease/iron deficiency and iron supplement added. Follow up CBC showed H/H to be stable. Her vitamin B-12 levels noted to WNL @ 489. Vitamin D deficiency noted w/Vit D level-24.6 and ergocalciferol 50,000 units/weekly added for supplement.    Xanax resumed to help manage anxiety d/o and insomnia. She completed 7 day course of Keppra for seizure prophylaxis and has been seizure free during her stay.  Diabetes has been monitored with ac/hs CBG checks and SSI was use prn for tighter BS control.    Rehab course: During patient's stay in rehab weekly team conferences were held to monitor patient's progress, set goals and discuss barriers to discharge. At admission, patient required mod to max assist with ADL tasks and with mobility. She exhibited mild to moderate cognitive deficits with intermittent word finding deficits as well as strained sounding voice.  She  has had improvement in activity tolerance, balance, postural control as well as ability to compensate for deficits.      Disposition: Home   Diet: Regular.   Special Instructions:  Discharge Instructions     Ambulatory referral to Physical Medicine Rehab   Complete by: As directed    Hospital follow up with Naaman Plummer or Marciano Sequin      Allergies as of 06/04/2022       Reactions   Codeine Nausea And Vomiting     Med Rec must be completed prior to using this [facility]***       Follow-up Information     Redmond School, MD Follow up.   Specialty: Internal Medicine Why: Call in 1-2 days for post hospital follow up Contact information: [place] [address] [phone]          Earnie Larsson, MD Follow up.   Specialty: Neurosurgery Why: Call in 1-2 days for post hospital follow up Contact information: [address] [address] [phone]         Meredith Staggers, MD Follow up.   Specialty: Physical Medicine and Rehabilitation Why: office will call you with follow up appointment Contact information: [address] [place] [address] [phone]                 Signed: Bary Leriche 06/04/2022, 11:37 AM

## 2022-06-03 NOTE — Progress Notes (Signed)
Physical Therapy Session Note  Patient Details  Name: Jane Andrews MRN: [id number] Date of Birth: 12-25-1924  Today's Date: 06/03/2022 PT Individual Time: 1300-1408 PT Individual Time Calculation (min): 68 min   Short Term Goals: Week 3:  PT Short Term Goal 1 (Week 3): STG=LTG due to ELOS.  Skilled Therapeutic Interventions/Progress Updates:     Patient in bed upon PT arrival. Patient alert and agreeable to PT session. Patient denied pain at rest, reports anterior knee pain with weight bearing, LPN and MD aware prior to session, awaiting Voltaren Gel arrival from pharmacy per RN. Patient also reported sacral pain in sitting today. Noted blanchable quarter size redness to coccyx, RN made aware, and sacral foam dressing applied during session to prevent further skin breakdown.   Applied Kinesiotape around the patient's R patella using structural support technique with the aim of minimizing patellar movement with mobility. Prior to application, patient denied any history of skin irritation or allergy to adhesive. Educated patient on purpose of kinesiotape placement and signs symptoms of allergic reaction or irritation. Cleaned patient's skin and applied test strip at beginning of session and removed at end of session without sings of skin irritation. Instructed patient that the tape can be left on up to 3 days and can be worn in the shower. Instructed to removed the tape if peeling off, signs of skin irritation arise, or it has been on for >3 days. Informed patient that the tape is best removed in the shower or with a wet wash cloth. Patient stated understanding. Rehab team informed about tape placement to assist with monitoring patient's response. Patient reported improved anterior knee pain with Kinesiotape applied.   Patient reports that she has not worn her thigh high TED hose today and has not had any symptoms. Assessed orthostatic vitals during session to assess need at this time. See  details below.   Orthostatic Vitals: Supine: BP 113/65, HR 87  Sitting: BP 97/61, HR 96 Standing: BP 74/52, HR 100 Standing x3 min: BP 106/66, HR 102 Patient asymptomatic throughout. Will continue to monitor need for thigh high TED hose. Tolerated session today without symptoms.   Therapeutic Activity: Bed Mobility: Patient performed supine to/from sit with min A-supervision with use of bed rails. Provided verbal cues for log roll technique. Reports continued dizziness with rolling and supine to sit lasting <1 min.  Transfers: Patient performed sit to/from stand x3 with CGA with and without RW. Provided verbal cues for forward weight shift, pressing toes into the floor, completing turn, and reaching back to sit.  Gait Training:  Patient ambulated ~20 feet with 2x180 deg turns using RW with CGA and min A x1 on turn due to minor posterior L LOB. Ambulated with mild variable foot placement, improved BOS and control of stepping with cues. Provided min verbal cues for increased BOS using RW as visual target and slowed cadence for increased control with stepping  Therapeutic Exercise: Patient performed the following exercises from HEP handout for [facility] Exercises group A  with B 0.25 lb ankle weights with verbal and tactile cues for proper technique. -B LAQ x10 -B hamstring curls x1 Patient with increased fatigue during session. Requested to complete exercises later this week. PT demonstrated remaining exercises, hip abduction, tandem stance, mini squats, and sit to stand. Patient appreciative and requested to lie down to rest due to increased fatigue.   Patient missed 7 min of skilled PT due to fatigue, RN made aware. Will attempt to make-up missed time as  able.    Patient in bed at end of session with breaks locked, bed alarm set, and all needs within reach.   Therapy Documentation Precautions:  Precautions Precautions: Fall, Cervical Precaution Booklet Issued: No Precaution Comments:  verbal review of neck precautions, will need reinforcement Required Braces or Orthoses: Cervical Brace Cervical Brace: Soft collar Restrictions Weight Bearing Restrictions: No Other Position/Activity Restrictions: 2nd rib fx General: PT Amount of Missed Time (min): 7 Minutes PT Missed Treatment Reason: Patient fatigue    Therapy/Group: Individual Therapy  Mintie Witherington L Eliyah Bazzi PT, DPT  06/03/2022, 3:43 PM

## 2022-06-03 NOTE — Progress Notes (Signed)
Speech Language Pathology TBI Note  Patient Details  Name: Jane Andrews MRN: [id number] Date of Birth: 1925/02/03  Today's Date: 06/03/2022 SLP Individual Time: 1103-1130 SLP Individual Time Calculation (min): 27 min  Short Term Goals: Week 2: SLP Short Term Goal 1 (Week 2): Patient will demonstrate functional problem solving with mildly complex tasks with Min verbal cues. SLP Short Term Goal 2 (Week 2): Patient will recall at least 3 pieces of functional information pertaining to her daily events with use of external and internal aids (WRAP strategies) with Min A verbal cues. SLP Short Term Goal 3 (Week 2): Patient will self-monitor and correct errors during functional tasks with Min verbal cues. SLP Short Term Goal 4 (Week 2): Patient will demonstrate sustained attention to functional tasks for 30 minutes with Min verbal cues for redirection.  Skilled Therapeutic Interventions: Skilled treatment session focused on cognitive-linguistic goals. SLP facilitated session by providing extra time and overall supervision level phonemic cues for word-finding during a novel, complex verbal description task. However, patient was overall Mod I for word-finding at the conversation level. Patient left upright in recliner with alarm on and all needs within reach. Continue with current plan of care.      Pain No/Denies Pain   Agitated Behavior Scale: TBI  *Agitated Behavior Scale not completed due to no unsafe behaviors documented since admission *  Therapy/Group: Individual Therapy  Savon Bordonaro 06/03/2022, 2:42 PM

## 2022-06-03 NOTE — Progress Notes (Addendum)
Occupational Therapy Session Note  Patient Details  Name: Jane Andrews MRN: [id number] Date of Birth: 1925/07/08  Today's Date: 06/03/2022 OT Individual Time: 4098-1191 OT Individual Time Calculation (min): 67 min    Short Term Goals: Week 1:  OT Short Term Goal 1 (Week 1): Pt will transfer to [facility] with mod A consistently OT Short Term Goal 1 - Progress (Week 1): Met OT Short Term Goal 2 (Week 1): Pt will be able to tolerate sitting EOB to perform UB ADL with close supervision OT Short Term Goal 2 - Progress (Week 1): Met OT Short Term Goal 3 (Week 1): Pt  will thread underwear/ brief and pants with min A witout AE OT Short Term Goal 3 - Progress (Week 1): Progressing toward goal Week 2:  OT Short Term Goal 1 (Week 2): Pt will use built up handles as needed to complete grooming tasks with set up assist only OT Short Term Goal 2 (Week 2): Pt will complete a stand pivot transfer to the toilet with min A OT Short Term Goal 3 (Week 2): Pt will don pants with min A overall, AD as needed  Skilled Therapeutic Interventions/Progress Updates:    The pt was in bed resting upon arrival, the pt indicated that she had bathe on yesterday and would prefer bathing at EOB today.  The pt was able to come from supine to EOB with MinA for managing BLE.  The pt was able to maintain static/dynamic sitting balance ,incorporating the bed rail for balance.  The pt was able to wash her UB and LB inclusive of the private area and bottom with s/uA using the stedy for standing balance incorporating the bar for balance.. The pt required MaxA for BLE, her lower legs and feet.  The pt was able to donn her UB clothing items with s/u assist, but was MaxA for managing buttons.  The pt was MaxA for donning her brief and ModA for donning her pants incorporating the stedy for donning her clothing items over her bottom. The pt was transported to the sink area using the stedy where she was able to brush her teeth and comb her  hair with s/u assist.  The pt was placed in the recliner using the stedy, she was able to reach for the arm of the chair with one hand for proper placement into the recliner. The pt bedside table, call light, and telephone were within reach.  The chair alarm was activiated with no report of pain during this OT treatment interaction.  Therapy Documentation Precautions:  Precautions Precautions: Fall, Cervical Precaution Booklet Issued: No Precaution Comments: verbal review of neck precautions, will need reinforcement Required Braces or Orthoses: Cervical Brace Cervical Brace: Soft collar Restrictions Weight Bearing Restrictions: No Other Position/Activity Restrictions: 2nd rib fx   Therapy/Group: Individual Therapy  Yvonne Kendall 06/03/2022, 10:46 AM

## 2022-06-03 NOTE — Progress Notes (Signed)
Patient ID: [name], female   DOB: 30-Mar-1925, 88 y.o.   MRN: [id number]  SW met with pt in room to provide updates from team conference, and d/c date remains   1615- SW made contact with pt niece [name] to provide updates from team conference, and d/c date remains., SW shared HHA list left in room and will need to review. Confirms will be here for family edu tomorrow.   SW left HHA list in room as patient was off unit due to radiation treatment.   Loralee Pacas, MSW, [place] Office: [phone] Cell: [phone] Fax: [phone]

## 2022-06-04 ENCOUNTER — Other Ambulatory Visit: Payer: Self-pay

## 2022-06-04 ENCOUNTER — Ambulatory Visit
Admit: 2022-06-04 | Discharge: 2022-06-04 | Disposition: A | Discharge status: Home / Self Care | Payer: Medicare Other | Attending: Radiation Oncology | Admitting: Radiation Oncology

## 2022-06-04 DIAGNOSIS — C670 Malignant neoplasm of trigone of bladder: Secondary | ICD-10-CM | POA: Diagnosis not present

## 2022-06-04 DIAGNOSIS — M792 Neuralgia and neuritis, unspecified: Secondary | ICD-10-CM

## 2022-06-04 DIAGNOSIS — Z510 Encounter for antineoplastic radiation therapy: Secondary | ICD-10-CM | POA: Diagnosis not present

## 2022-06-04 LAB — RAD ONC ARIA SESSION SUMMARY
Course Elapsed Days: 8
Plan Fractions Treated to Date: 7
Plan Prescribed Dose Per Fraction: 3.5 Gy
Plan Total Fractions Prescribed: 8
Plan Total Prescribed Dose: 28 Gy
Reference Point Dosage Given to Date: 24.5 Gy
Reference Point Session Dosage Given: 3.5 Gy
Session Number: 7

## 2022-06-04 MED ORDER — GABAPENTIN 100 MG PO CAPS
100.0000 mg | ORAL_CAPSULE | Freq: Two times a day (BID) | ORAL | Status: DC
  Administered 2022-06-04 – 2022-06-05 (×3): 100 mg via ORAL
  Filled 2022-06-04 (×3): qty 1

## 2022-06-04 MED ORDER — GABAPENTIN 100 MG PO CAPS: 100.0000 mg | ORAL_CAPSULE | Freq: Two times a day (BID) | ORAL | Status: DC

## 2022-06-04 MED ORDER — ACETAMINOPHEN 500 MG PO TABS
1000.0000 mg | ORAL_TABLET | Freq: Three times a day (TID) | ORAL | Status: DC
  Administered 2022-06-04 – 2022-06-05 (×7): 1000 mg via ORAL
  Filled 2022-06-04 (×7): qty 2

## 2022-06-04 NOTE — Progress Notes (Signed)
Patient ID: SHAWNIKA PEPIN, female   DOB: 1925-03-07, 88 y.o.   MRN: [id number]  SW met with pt and pt niece [name] to review discharge. Preferred [facility]- [facility] SW will order: RW, 3in1 BSC, TTB, and transport chair.   SW sent HHPT/OT/SLP/aide referral to [facility] and waiting on follow-up.   DME order sent to [employer] via parachute.   Loralee Pacas, MSW, [place] Office: [phone] Cell: [phone] Fax: [phone]

## 2022-06-04 NOTE — Progress Notes (Signed)
Occupational Therapy Session Note  Patient Details  Name: Jane Andrews MRN: [id number] Date of Birth: [birth year]  Today's Date: 06/04/2022 OT Individual Time: 1007-1047 OT Individual Time Calculation (min): 40 min    Short Term Goals: Week 2:  OT Short Term Goal 1 (Week 2): Pt will use built up handles as needed to complete grooming tasks with set up assist only OT Short Term Goal 1 - Progress (Week 2): Met OT Short Term Goal 2 (Week 2): Pt will complete a stand pivot transfer to the toilet with min A OT Short Term Goal 2 - Progress (Week 2): Met OT Short Term Goal 3 (Week 2): Pt will don pants with min A overall, AD as needed OT Short Term Goal 3 - Progress (Week 2): Progressing toward goal  Skilled Therapeutic Interventions/Progress Updates:    S: Patient agreeable to participate in OT session. Reports 0/10 pain level.    Patient participated in skilled OT session focusing on functional transfers, UB strengthening., and standing tolerance/balance. Therapist facilitated ADL skills and upper extremity strength during session in order to improve patient's overall functional performance during bed to chair transfers using RW and during self care tasks. Pt provided with Min  assist and VC for form and technique.  Patient completed strengthening activity seated while using her LUE to push forward folded wrist weights using shoulder flexion with combined elbow extension 10X then switched using her RUE to complete same task with greater weight 10X. Completed both upper extremities twice.  Standing tolerance activity completed while sorting deck of cards. Patient utilized RW while standing up against table. Heavily relied on stable surface to maintain balance. Pt was able to verbalize when she fatigued and required a rest break.     Therapy Documentation Precautions:  Precautions Precautions: Fall, Cervical Precaution Booklet Issued: No Precaution Comments: verbal review of neck  precautions, will need reinforcement Required Braces or Orthoses: Cervical Brace Cervical Brace: Soft collar Restrictions Weight Bearing Restrictions: No Other Position/Activity Restrictions: 2nd rib fx    Jane Andrews, OTR/L,CBIS  Supplemental OT - [facility]  06/04/2022, 2:36 PM

## 2022-06-04 NOTE — Progress Notes (Signed)
PROGRESS NOTE   Subjective/Complaints:  Reports continues tingling her her hands and feet. Continues to have loose BMs.   ROS: Patient denies CP, SOB, N/V/D, cough   Objective:     No results found. Recent Labs    06/02/22 0603  WBC 7.9  HGB 10.4*  HCT 31.4*  PLT 201      Recent Labs    06/02/22 0603  NA 136  K 3.9  CL 111  CO2 16*  GLUCOSE 90  BUN 32*  CREATININE 1.52*  CALCIUM 9.0       Intake/Output Summary (Last 24 hours) at 06/04/2022 0806 Last data filed at 06/03/2022 2100 Gross per 24 hour  Intake 900 ml  Output --  Net 900 ml         Physical Exam: Vital Signs Blood pressure 114/60, pulse 71, temperature 98.2 F (36.8 C), temperature source Oral, resp. rate 16, height '5\' 1"'$  (1.549 m), weight 53.8 kg, SpO2 98 %.   General: No acute distress, in chair Mood and affect are appropriate Heart: Regular rate and rhythm no rubs murmurs or extra sounds Lungs: Clear to auscultation,  no rales or wheezes Abdomen: Positive bowel sounds, soft nontender to palpation, nondistended Extremities: No clubbing, cyanosis, or edema Skin: dressing over skin tear left arm clean and dry, otherwise warm and dry Neurologic: Cranial nerves II through XII intact, motor strength is 5/5 in bilateral deltoid, bicep, tricep, grip, hip flexor, knee extensors, ankle dorsiflexor and plantar flexor  Musculoskeletal: Full range of motion in all 4 extremities. No joint swelling, no pain with ROM x 4 ext      Assessment/Plan: 1. Functional deficits which require 3+ hours per day of interdisciplinary therapy in a comprehensive inpatient rehab setting. Physiatrist is providing close team supervision and 24 hour management of active medical problems listed below. Physiatrist and rehab team continue to assess barriers to discharge/monitor patient progress toward functional and medical goals  Care Tool:  Bathing    Body  parts bathed by patient: Right arm, Left arm, Chest, Abdomen, Right upper leg, Left upper leg, Face   Body parts bathed by helper: Front perineal area, Buttocks, Right lower leg, Left lower leg     Bathing assist Assist Level: Moderate Assistance - Patient 50 - 74%     Upper Body Dressing/Undressing Upper body dressing   What is the patient wearing?: Button up shirt    Upper body assist Assist Level: Moderate Assistance - Patient 50 - 74%    Lower Body Dressing/Undressing Lower body dressing      What is the patient wearing?: Incontinence brief, Pants     Lower body assist Assist for lower body dressing: Maximal Assistance - Patient 25 - 49%     Toileting Toileting    Toileting assist Assist for toileting: Moderate Assistance - Patient 50 - 74%     Transfers Chair/bed transfer  Transfers assist     Chair/bed transfer assist level: Minimal Assistance - Patient > 75% Chair/bed transfer assistive device: Programmer, multimedia   Ambulation assist      Assist level: Minimal Assistance - Patient > 75% Assistive device: Walker-rolling Max distance: 164 ft  Walk 10 feet activity   Assist  Walk 10 feet activity did not occur: Safety/medical concerns  Assist level: Minimal Assistance - Patient > 75% Assistive device: Walker-rolling   Walk 50 feet activity   Assist Walk 50 feet with 2 turns activity did not occur: Safety/medical concerns  Assist level: Minimal Assistance - Patient > 75% Assistive device: Walker-rolling    Walk 150 feet activity   Assist Walk 150 feet activity did not occur: Safety/medical concerns  Assist level: Minimal Assistance - Patient > 75% Assistive device: Walker-rolling    Walk 10 feet on uneven surface  activity   Assist Walk 10 feet on uneven surfaces activity did not occur: Safety/medical concerns         Wheelchair     Assist Is the patient using a wheelchair?: Yes Type of Wheelchair: Manual     Wheelchair assist level: Dependent - Patient 0%      Wheelchair 50 feet with 2 turns activity    Assist        Assist Level: Dependent - Patient 0%   Wheelchair 150 feet activity     Assist      Assist Level: Dependent - Patient 0%   Blood pressure 114/60, pulse 71, temperature 98.2 F (36.8 C), temperature source Oral, resp. rate 16, height '5\' 1"'$  (1.549 m), weight 53.8 kg, SpO2 98 %.  . Medical Problem List and Plan: 1. Functional deficits secondary to Central cord syndrome as well as TBI (SAH/SDH)             -patient may shower, ordered [place] collar for shower             -ELOS/Goals: 06/06/22 Min A to Sup             -Continue CIR therapies including PT, OT, and SLP  -PT asked about soft collar, will continue until f/u with SNGY  2.  Antithrombotics: -DVT/anticoagulation:  Pharmaceutical: Lovenox started pm 06/02              -antiplatelet therapy: N/a due to bleed.  3. Pain: continue Tylenol QID, Cymbalta '90mg'$  daily, gabapentin, tramadol  -encouraged patient to massage area, avoid direct pressure when she can. It should improve with time  -Pain well controlled 6/20  -Increased gabapentin '100mg'$  to BID 4. Mood/sleep: pt to provide ego support -pt is already on cymbalta and xanax as pta for anxiety disorder   -adjusted xanax to '1mg'$  qhs she uses at home which has helped sleep  - free T4 level ok, acutally a bit high - neuropsych requested -team/family continues to provide positive reinforcement and support              -antipsychotic agents: N/A 5. Neuropsych: This patient is capable of making decisions on her own behalf. 6. Skin/Wound Care: wound cdi. Still some associated sensitivity  7. Fluids/Electrolytes/Nutrition: encourage PO     -6/16 BUN  has improved back to baseline range   -dc IVF. Recheck bmet 6/19               6/18 Pending qMonday labs  6/20 Labs yesterday with Cr stable at 1.52    Latest Ref Rng & Units 06/02/2022    6:03 AM  05/30/2022    5:16 AM 05/26/2022    5:28 AM  BMP  Glucose 70 - 99 mg/dL 90  91  91   BUN 8 - 23 mg/dL 32  29  36   Creatinine 0.44 - 1.00 mg/dL 1.52  1.50  1.53   Sodium 135 - 145 mmol/L 136  139  136   Potassium 3.5 - 5.1 mmol/L 3.9  3.9  3.7   Chloride 98 - 111 mmol/L 111  113  108   CO2 22 - 32 mmol/L '16  18  18   '$ Calcium 8.9 - 10.3 mg/dL 9.0  8.8  8.5     8.  Seizure proph: keppra completed 9. L occipital calvarium Fx: Soft collar at all times with outpatient follow up w/Dr. Annette Stable  -no ecchymosis or drainage              6/18 Site C/D/I 10. R- 2nd rib Fx: Encouraging IS 11. 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  -still having sensory sx in hands, LE's to lesser extent,no change  -Increased gabapentin '100mg'$  to BID 12. CKD stage IV: Followed by Dr. Theador Hawthorne Baseline SCr- -- Continue sodium bicarb -Cr stable at 1.52 on labs 6/19, continue to monitor 13. H/o UGIB/Iron deficiency anemia/HH: Add iron supplement. B12 reviewed and is stable.  6/18 Continue iron supplements 6/20 HGB 10.4 improved, follow 14. TURBT 04/16/2022 for invasive high grade bladder CA: h/o dysuria/frequency -palliative xrt at [facility]-- -sessions after therapy complete- pt worried about her endurance  16. 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  -continue Imodium prn 17. Vitamin D deficiency: started ergocalciferol 50,000U once per week for 7 weeks, f/u with PCP for recheck 18. BPPV: changed meclizine to tid prn  -vestibular treatment limited d/t cervical issues 19. S/p mechanical fall unwitnessed early morning on 06/01/22 Xrays of C spine and L spine show no acute abnormalities, CT head showed chronic small vessel disease no acute abnormalities     LOS: 18 days A FACE TO FACE EVALUATION WAS PERFORMED  Jennye Boroughs 06/04/2022, 8:06 AM

## 2022-06-04 NOTE — Progress Notes (Signed)
Occupational Therapy Session Note  Patient Details  Name: Jane Andrews MRN: [id number] Date of Birth: 1925-05-14  Today's Date: 06/04/2022 OT Individual Time: 1007-1047 OT Individual Time Calculation (min): 40 min    Short Term Goals: Week 3:  OT Short Term Goal 1 (Week 3): STG=LTG d/t ELOS  Skilled Therapeutic Interventions/Progress Updates:    Family education session completed with pt and her niece [name]. Verbal education provided re fall risk reduction, energy conservation strategies, home carryover of transfer training, ADLs, and IADLs. Demonstration and hands on training completed for pt performance of UB/LB bathing and dressing at CGA-min A level, toileting hygiene and transfers, and shower transfers. Discussed equipment recommendations, including TTB and BSC- as well as bed rails and potentially a step to have her place her feet on while dressing. Problem solved through home set up and accessibility with pt and [name]. In the ADL apt, practiced a real TTB transfer, toilet transfer, and bed transfer. Cueing for safety and body mechanics of caregiver. Practiced donning/doffing [place] collar. Pt left sitting up in the w/c with her niece present.    Therapy Documentation Precautions:  Precautions Precautions: Fall, Cervical Precaution Booklet Issued: No Precaution Comments: verbal review of neck precautions, will need reinforcement Required Braces or Orthoses: Cervical Brace Cervical Brace: Soft collar Restrictions Weight Bearing Restrictions: No Other Position/Activity Restrictions: 2nd rib fx   Therapy/Group: Individual Therapy  Curtis Sites 06/04/2022, 11:15 AM

## 2022-06-04 NOTE — Progress Notes (Signed)
Physical Therapy Session Note  Patient Details  Name: Jane Andrews MRN: [id number] Date of Birth: October 11, 1925  Today's Date: 06/04/2022 PT Individual Time: 0905-1005 PT Individual Time Calculation (min): 60 min   Short Term Goals: Week 3:  PT Short Term Goal 1 (Week 3): STG=LTG due to ELOS.  Skilled Therapeutic Interventions/Progress Updates:     Patient in bed upon PT arrival. Patient alert and agreeable to PT session. Patient reported 2-3/10 R knee pain in stairs only during session, RN made aware. PT provided repositioning, rest breaks, and distraction as pain interventions throughout session.   Patient's niece present for family education and hands on training throughout session. Performed safe guarding with all mobility following PT cues and/or demonstration. Educated on fall risk/prevention, home modifications to prevent falls, and activation of emergency services in the event of a fall during session.   Reviewed equipment recommendations, RW and transports chair for reduced fall risk with mobility. Educated on use of transport chair for energy conservation, community access, and outdoor mobility at this time and use of RW with all standing and gait for safety. Patient and her niece in agreement. Reviewed energy conservation strategies, especially in regards to time when patient will receive radiation, and expectations for recovery with follow-up therapy to progress patient with community integration, as she was active in the community PTA.  Therapeutic Activity: Bed Mobility: Patient reports that she was incontinent of bowel in the bed prior to session due to urgency. She performed rolling R/L with use of bed rails with supervision-min A. Performed peri-care and changed incontinence brief with total A.  performed supine to sit with supervision in a flat bed with use of bed rail to simulate home set-up. Provided verbal cues for log roll technique.  Patient sat EOB with bed elevated  to simulate home set-up while getting dressed with mod A provided by her niece. Placed a stool under her feet for improved sitting balance with elevated height of the bed.  Transfers: Patient performed sit to/from stand x4 with CGA-close supervision. Provided verbal cues for pushing up and reaching back, forward weight shift, and shifting weight into her toes to reduce posterior bias, patient's niece provided cues after first trial. Patient performed a simulated sedan height car transfer with CGA-min A using RW. Provided cues for safe technique.  Gait Training:  Patient ambulated 15 feet and 50 feet using RW with CGA and min A x2 due to L LOB. Ambulated with mild variable foot placement, improved BOS and control of stepping with cues. Provided min verbal cues for increased BOS using RW as visual target and slowed cadence for increased control with stepping. Patient ascended/descended 4x6" steps and 1x8" step to simulate home entry using 1 rail with min A. Performed step-to gait pattern leading with R while ascending and L while descending. Provided cues for guarding technique and sequencing. Reviewed assist to sitting in the event of LOB on the steps.   Patient in w/c with her niece in the room awaiting OT to continue family education at end of session with breaks locked and all needs within reach.   Therapy Documentation Precautions:  Precautions Precautions: Fall, Cervical Precaution Booklet Issued: No Precaution Comments: verbal review of neck precautions, will need reinforcement Required Braces or Orthoses: Cervical Brace Cervical Brace: Soft collar Restrictions Weight Bearing Restrictions: No Other Position/Activity Restrictions: 2nd rib fx    Therapy/Group: Individual Therapy  Sota Hetz L Arvis Miguez PT, DPT  06/04/2022, 12:37 PM

## 2022-06-04 NOTE — Progress Notes (Signed)
Speech Language Pathology TBI Note  Patient Details  Name: ZELLIE JENNING MRN: [id number] Date of Birth: 1925/09/15  Today's Date: 06/04/2022 SLP Individual Time: 1050-1130 SLP Individual Time Calculation (min): 40 min  Short Term Goals: Week 2: SLP Short Term Goal 1 (Week 2): Patient will demonstrate functional problem solving with mildly complex tasks with Min verbal cues. SLP Short Term Goal 2 (Week 2): Patient will recall at least 3 pieces of functional information pertaining to her daily events with use of external and internal aids (WRAP strategies) with Min A verbal cues. SLP Short Term Goal 3 (Week 2): Patient will self-monitor and correct errors during functional tasks with Min verbal cues. SLP Short Term Goal 4 (Week 2): Patient will demonstrate sustained attention to functional tasks for 30 minutes with Min verbal cues for redirection.  Skilled Therapeutic Interventions: Skilled treatment session focused on completion of patient and family education with the patient's niece. SLP facilitated session by providing education regarding patient's current cognitive-linguistic functioning and strategies to utilize at home to maximize word-finding and working memory. SLP also provided a verbal list of activities/tasks patient can participate in at home safely to stay cognitively engaged. Handouts were also given to reinforce information. Both verbalized understanding. Patient left upright in wheelchair with family present.  Continue with current plan of care.      Pain Pain Assessment Pain Scale: 0-10 Pain Score: 0-No pain  Agitated Behavior Scale: TBI  ABS discontinued d/t ABS score less than 20 for the last three days or no behaviors present   Therapy/Group: Individual Therapy  Millie Shorb 06/04/2022, 1:04 PM

## 2022-06-04 NOTE — Progress Notes (Signed)
Recreational Therapy Session Note  Patient Details  Name: Jane Andrews MRN: [id number] Date of Birth: 1924-12-30 Today's Date: 06/04/2022  Pain: no c/o Skilled Therapeutic Interventions/Progress Updates: Pt participated in animal assisted activity seated w/c level with supervision.  Pt visibly excited to see the pet partner team and quickly engaged in conversations and petting the dog.  Pt utilized BUEs as active assist during interaction.  Pt discussing upcoming discharge.  Emotional support and encouragement provided as pt state uncertainty about returning home.  Pt's niece present, observing, participating and encouraging pt about upcoming discharge. Pt response:   Therapy/Group: Individual Therapy  Glady Ouderkirk 06/04/2022, 1:24 PM

## 2022-06-05 ENCOUNTER — Telehealth: Payer: Self-pay

## 2022-06-05 ENCOUNTER — Ambulatory Visit
Admit: 2022-06-05 | Discharge: 2022-06-05 | Disposition: A | Discharge status: Home / Self Care | Payer: Medicare Other | Attending: Radiation Oncology | Admitting: Radiation Oncology

## 2022-06-05 ENCOUNTER — Other Ambulatory Visit: Payer: Self-pay

## 2022-06-05 ENCOUNTER — Encounter: Payer: Self-pay | Admitting: Urology

## 2022-06-05 ENCOUNTER — Ambulatory Visit: Payer: Medicare Other

## 2022-06-05 DIAGNOSIS — C670 Malignant neoplasm of trigone of bladder: Secondary | ICD-10-CM | POA: Diagnosis not present

## 2022-06-05 DIAGNOSIS — Z510 Encounter for antineoplastic radiation therapy: Secondary | ICD-10-CM | POA: Diagnosis not present

## 2022-06-05 DIAGNOSIS — C679 Malignant neoplasm of bladder, unspecified: Secondary | ICD-10-CM

## 2022-06-05 LAB — RAD ONC ARIA SESSION SUMMARY
Course Elapsed Days: 9
Plan Fractions Treated to Date: 8
Plan Prescribed Dose Per Fraction: 3.5 Gy
Plan Total Fractions Prescribed: 8
Plan Total Prescribed Dose: 28 Gy
Reference Point Dosage Given to Date: 28 Gy
Reference Point Session Dosage Given: 3.5 Gy
Session Number: 8

## 2022-06-05 MED ORDER — GABAPENTIN 100 MG PO CAPS
200.0000 mg | ORAL_CAPSULE | Freq: Two times a day (BID) | ORAL | Status: DC
  Administered 2022-06-05 – 2022-06-07 (×3): 200 mg via ORAL
  Filled 2022-06-05 (×3): qty 2

## 2022-06-05 MED ORDER — LOPERAMIDE HCL 2 MG PO CAPS
4.0000 mg | ORAL_CAPSULE | ORAL | Status: DC | PRN
  Administered 2022-06-05: 4 mg via ORAL
  Filled 2022-06-05 (×2): qty 2

## 2022-06-05 NOTE — Progress Notes (Addendum)
Gave pt compazine for nausea.  This LPN waited at least 20 minutes before giving her her meds.  After giving pt meds she vomited.  Charge nurse made aware.

## 2022-06-05 NOTE — Plan of Care (Signed)
  Problem: RH Swallowing Goal: LTG Patient will consume least restrictive diet using compensatory strategies with assistance (SLP) Description: LTG:  Patient will consume least restrictive diet using compensatory strategies with assistance (SLP) Outcome: Completed/Met   Problem: RH Expression Communication Goal: LTG Patient will increase word finding of common (SLP) Description: LTG:  Patient will increase word finding of common objects/daily info/abstract thoughts with cues using compensatory strategies (SLP). Outcome: Completed/Met   Problem: RH Problem Solving Goal: LTG Patient will demonstrate problem solving for (SLP) Description: LTG:  Patient will demonstrate problem solving for basic/complex daily situations with cues  (SLP) Outcome: Completed/Met   Problem: RH Memory Goal: LTG Patient will demonstrate ability for day to day (SLP) Description: LTG:   Patient will demonstrate ability for day to day recall/carryover during cognitive/linguistic activities with assist  (SLP) Outcome: Completed/Met Goal: LTG Patient will use memory compensatory aids to (SLP) Description: LTG:  Patient will use memory compensatory aids to recall biographical/new, daily complex information with cues (SLP) Outcome: Completed/Met   Problem: RH Attention Goal: LTG Patient will demonstrate this level of attention during functional activites (SLP) Description: LTG:  Patient will will demonstrate this level of attention during functional activites (SLP) Outcome: Completed/Met   Problem: RH Awareness Goal: LTG: Patient will demonstrate awareness during functional activites type of (SLP) Description: LTG: Patient will demonstrate awareness during functional activites type of (SLP) Outcome: Completed/Met

## 2022-06-05 NOTE — Progress Notes (Signed)
Speech Language Pathology Discharge Summary  Patient Details  Name: Jane Andrews MRN: [id number] Date of Birth: 06/17/1925  Today's Date: 06/05/2022 SLP Individual Time: 1415-1445 SLP Individual Time Calculation (min): 30 min  Skilled Therapeutic Interventions: Pt agreeable to SLP intervention. Niece present at bedside and requested assist with setting up adaptive equipment to ensure readiness for discharge tomorrow. SLP assisted with set-up of bedside commode. SLP also provided follow up education regarding cognitive engaging activities in home setting, and strategies to maximize memory. Pt and niece indicated interest in going to senior center with friends for social engagement when pt feels more confident with self feeding abilities. All questions addressed and pt/family are feeling ready for discharge tomorrow. Pt left in bed with alarm activated and immediate needs within reach.   Patient has met 7 of 7 long term goals.  Patient to discharge at overall Supervision level.   Reasons goals not met: N/A   Clinical Impression/Discharge Summary: Patient has made functional gains and has met 7 of 7 LTGs this reporting period. Currently, patient demonstrates behaviors consistent with a Rancho Level VIII and requires overall supervision level verbal cues to complete functional and familiar tasks safely in regard to problem solving, selective attention, emergent awareness and recall with use of external aids. Patient also requires intermittent supervision verbal cues for word-finding at the conversation level. Patient is consuming regular textures with thin liquids without overt s/s of aspiration and is Mod I for use of swallowing compensatory strategies. Patient and family education is complete and patient will discharge home with 24 hour supervision from family. Patient would benefit from f/u SLP intervention to maximize her cognitive-linguistic functioning and overall functional independence in  order to reduce caregiver burden.   Care Partner:  Caregiver Able to Provide Assistance: Yes  Type of Caregiver Assistance: Physical;Cognitive  Recommendation:  Home Health SLP;24 hour supervision/assistance  Rationale for SLP Follow Up: Reduce caregiver burden;Maximize cognitive function and independence   Equipment: N/A   Reasons for discharge: Discharged from hospital;Treatment goals met   Patient/Family Agrees with Progress Made and Goals Achieved: Yes    PAYNE, COURTNEY 06/05/2022, 6:26 AM  

## 2022-06-05 NOTE — Progress Notes (Signed)
Physical Therapy Discharge Summary  Patient Details  Name: Jane Andrews MRN: [id number] Date of Birth: May 03, 1925  Today's Date: 06/05/2022 PT Individual Time: 0800-0900 PT Individual Time Calculation (min): 60 min    Patient has met 10 of 10 long term goals due to improved activity tolerance, improved balance, improved postural control, decreased pain, ability to compensate for deficits, improved attention, improved awareness, and improved coordination.  Patient to discharge at a household ambulatory level [place].   Patient's care partner is independent to provide the necessary physical and cognitive assistance at discharge.  Reasons goals not met: All PT goals met at this time.   Recommendation:  Patient will benefit from ongoing skilled PT services in home health setting to continue to advance safe functional mobility, address ongoing impairments in balance, coordination, activity tolerance, functional mobility, safety awareness, gait and stair training, community integration, patient/caregiver education, and minimize fall risk.  Equipment: Recommending: RW  and transport chair  Reasons for discharge: treatment goals met and discharge from hospital  Patient/family agrees with progress made and goals achieved: Yes  Skilled Therapeutic Intervention: Patient in bed on the bedpan with am BM upon PT arrival, patient requested more time on the bed pan and asked PT to return in a few minutes. Returned after 10 min with patient in bed with nursing staff at bedside. Patient again reporting need for BM on nursing staffs departure, patient agreeable to get to [facility] with PT. Patient alert and agreeable to PT session. Patient denied pain during session.  Patient with x2 continent BMs during session, see flowsheet for details. Limited session to in room due to bowel frequency this session. Focused on d/c assessment, see below, and educating patient on her progress and expectations for  follow-up therapy.   Therapeutic Activity: Bed Mobility: Patient performed supine to/from sit with CGA-min A in a flat bed without use of bed rails. Provided verbal cues for log roll technique. Transfers: Patient performed stand pivot bed<>BSC x2 with CGA using RW. Provided verbal cues for forward weight shift, shifting weight into her toes to prevent posterior bias, and pushing up and reaching back for safety.  Patient performed peri-care and lower body clothing management with total A due to decreased standing balance without B upper extremity support during toileting. Removed Kinesiotape from patient's R knee due to limited benefit while patient was toileting. Used adhesive removal wipes to maintain patient's skin integrity, no adverse response to the tape or removal noted after and patient tolerated this well.   Patient on [facility] handed off to NT at end of session.   PT Discharge Precautions/Restrictions Precautions Precautions: Fall;Cervical Precaution Comments: verbal review of neck precautions when donning/doffing neck brace Required Braces or Orthoses: Cervical Brace Cervical Brace: Soft collar;Other (comment) (c-collar for bathing) Restrictions Weight Bearing Restrictions: No Other Position/Activity Restrictions: 2nd rib fx Pain Pain Assessment Pain Scale: 0-10 Pain Score: 0-No pain Pain Interference Pain Interference Pain Effect on Sleep: 1. Rarely or not at all Pain Interference with Therapy Activities: 2. Occasionally (R anterior knee pain with sit to stand and stairs) Pain Interference with Day-to-Day Activities: 1. Rarely or not at all Vision/Perception  Vision - History Ability to See in Adequate Light: 0 Adequate Vision - Assessment Eye Alignment: Within Functional Limits Ocular Range of Motion: Within Functional Limits Alignment/Gaze Preference: Within Defined Limits Tracking/Visual Pursuits: Able to track stimulus in all quads without difficulty Saccades: Decreased  speed of saccadic movement;Additional eye shifts occurred during testing Convergence: Within functional limits Additional Comments:  mild intermittent nystagmus with quick supine to/from sit and rolling, unable to perform visual assessment due to cervical precautions; able to compensate with slow movements Perception Perception: Within Functional Limits Praxis Praxis: Impaired Praxis Impairment Details: Motor planning  Cognition Overall Cognitive Status: Impaired/Different from baseline Arousal/Alertness: Awake/alert Focused Attention: Appears intact Sustained Attention: Appears intact Memory: Impaired Memory Impairment: Decreased short term memory;Storage deficit;Decreased recall of new information Decreased Short Term Memory: Verbal complex;Functional complex Awareness: Impaired Awareness Impairment: Anticipatory impairment Problem Solving: Impaired Problem Solving Impairment: Verbal basic;Verbal complex Reasoning: Appears intact Safety/Judgment: Impaired Rancho [employer] Scales of Cognitive Functioning: Purposeful/appropriate Sensation Sensation Light Touch: Impaired Detail Central sensation comments: numbness/tingling, described "like sand paper" on distal upper and lower extremities with stocking glove distribution; with sensation testing sensation absent at distal toes and lateral plantar surface Light Touch Impaired Details: Impaired LLE;Impaired RLE;Impaired LUE;Impaired RUE Hot/Cold: Appears Intact Proprioception: Impaired Detail Proprioception Impaired Details: Impaired RLE;Impaired LLE Coordination Gross Motor Movements are Fluid and Coordinated: No Fine Motor Movements are Fluid and Coordinated: No Coordination and Movement Description: Ataxia, LE's>UE's, limiting coordination with all movements Finger Nose Finger Test: Intact Motor  Motor Motor: Tetraplegia;Ataxia;Abnormal postural alignment and control Motor - Discharge Observations: Significant improvement  overall with increasing FMC, balance, and endurnace during BADLs.  Mobility Bed Mobility Bed Mobility: Rolling Right;Right Sidelying to Sit;Supine to Sit;Sit to Supine;Sit to Sidelying Right Rolling Right: Supervision/verbal cueing Rolling Left: Supervision/Verbal cueing Right Sidelying to Sit: Supervision/Verbal cueing Supine to Sit: Supervision/Verbal cueing Sit to Supine: Supervision/Verbal cueing Sit to Sidelying Right: Supervision/Verbal cueing Transfers Transfers: Sit to Stand;Stand to Sit;Transfer Sit to Stand: Contact Guard/Touching assist Stand to Sit: Contact Guard/Touching assist Stand Pivot Transfers: Contact Guard/Touching assist Stand Pivot Transfer Details: Verbal cues for technique;Verbal cues for precautions/safety;Verbal cues for safe use of DME/AE Transfer (Assistive device): Rolling walker Locomotion  Pick up small object from the floor (from standing position) activity did not occur:  (decreased coordination and balance) Pick up small object from the floor assist level: Moderate Assistance - Patient 50 - 74% Pick up small object from the floor assistive device: RW and reacher  Trunk/Postural Assessment  Postural Control Postural Control: Deficits on evaluation Righting Reactions: delayed Protective Responses: delayed  Balance Balance Balance Assessed: Yes Standardized Balance Assessment Standardized Balance Assessment: Berg Balance Test Berg Balance Test Sit to Stand: Needs minimal aid to stand or to stabilize Standing Unsupported: Unable to stand 30 seconds unassisted Sitting with Back Unsupported but Feet Supported on Floor or Stool: Able to sit safely and securely 2 minutes Stand to Sit: Needs assistance to sit Transfers: Needs one person to assist Standing Unsupported with Eyes Closed: Needs help to keep from falling Standing Ubsupported with Feet Together: Needs help to attain position and unable to hold for 15 seconds From Standing, Reach Forward with  Outstretched Arm: Loses balance while trying/requires external support From Standing Position, Pick up Object from Floor: Unable to try/needs assist to keep balance From Standing Position, Turn to Look Behind Over each Shoulder: Needs assist to keep from losing balance and falling Turn 360 Degrees: Needs assistance while turning Standing Unsupported, Alternately Place Feet on Step/Stool: Needs assistance to keep from falling or unable to try Standing Unsupported, One Foot in Front: Loses balance while stepping or standing Standing on One Leg: Unable to try or needs assist to prevent fall Total Score: 6 Static Sitting Balance Static Sitting - Balance Support: Feet supported Static Sitting - Level of Assistance: 5: Stand by assistance Dynamic Sitting Balance  Dynamic Sitting - Balance Support: During functional activity;Feet supported Dynamic Sitting - Level of Assistance: 5: Stand by assistance Dynamic Sitting - Balance Activities: Reaching for objects;Forward lean/weight shifting Static Standing Balance Static Standing - Balance Support: During functional activity;Bilateral upper extremity supported Static Standing - Level of Assistance: 5: Stand by assistance Dynamic Standing Balance Dynamic Standing - Balance Support: During functional activity;Right upper extremity supported;Left upper extremity supported Dynamic Standing - Level of Assistance: 4: Min assist Dynamic Standing - Balance Activities: Reaching for objects Extremity Assessment  RLE Assessment RLE Assessment: Within Functional Limits Active Range of Motion (AROM) Comments: WFL for functional mobility General Strength Comments: Grossly at least 4+/5 throughout in sitting LLE Assessment LLE Assessment: Within Functional Limits Active Range of Motion (AROM) Comments: WFL for functional mobility General Strength Comments: Grossly at least 4+/5 throughout in sitting    Kurstin Dimarzo L Kriss Ishler PT, DPT  06/05/2022, 12:28 PM

## 2022-06-05 NOTE — Progress Notes (Signed)
Patient ID: KARN DERK, female   DOB: [birth year], 88 y.o.   MRN: [id number]  HHPT/OT/SLP/aide referral accepted by [facility].   SW spoke with pt niece [name] to discuss above. Confirms that her aunt says DME arrived to room, and transport chair delivered to home. SW reviewed d/c process.   SW met with pt to inform on above, and confirms DME delivered to room and transport chair delivered to the home.   Loralee Pacas, MSW, [place] Office: [phone] Cell: [phone] Fax: [phone]

## 2022-06-05 NOTE — Progress Notes (Incomplete)
Inpatient Rehabilitation Discharge Medication Review by a Pharmacist  A complete drug regimen review was completed for this patient to identify any potential clinically significant medication issues.  High Risk Drug Classes Is patient taking? Indication by Medication  Antipsychotic No   Anticoagulant No   Antibiotic No   Opioid Yes Prn Tramadol - pain  Antiplatelet No   Hypoglycemics/insulin No   Vasoactive Medication Yes Metoprolol XL - blood pressure  Chemotherapy No   Other Yes Cymbalta - mood Levothyroxine - hypothyroidism Protonix- GERD Mirabegron - incontinence Prn Alprazolam - anxiety Prn Trazodone - sleep     Type of Medication Issue Identified Description of Issue Recommendation(s)  Drug Interaction(s) (clinically significant)     Duplicate Therapy     Allergy     No Medication Administration End Date     Incorrect Dose     Additional Drug Therapy Needed     Significant med changes from prior encounter (inform family/care partners about these prior to discharge).    Other       Clinically significant medication issues were identified that warrant physician communication and completion of prescribed/recommended actions by midnight of the next day:  No  Time spent performing this drug regimen review (minutes):  30  Jacqualyn Sedgwick BS, PharmD, BCPS Clinical Pharmacist 06/05/2022 9:10 AM  Contact: [phone] after 3 PM  "Be curious, not judgmental..." -Jamal Maes

## 2022-06-05 NOTE — Progress Notes (Signed)
Occupational Therapy Session Note  Patient Details  Name: Jane Andrews MRN: [id number] Date of Birth: [birth year]  Today's Date: 06/05/2022 OT Individual Time: 1301-1401 OT Individual Time Calculation (min): 60 min   Short Term Goals: Week 3:  OT Short Term Goal 1 (Week 3): STG=LTG d/t ELOS  Skilled Therapeutic Interventions/Progress Updates:    Pt greeted semi-reclined in bed with nurse tech present getting vitals and agreeable to OT treatment session. OT reviewed dc plan, OT goals, and self-care modifications in home environment. OT issued home fine motor program and reviewed activities with patient. Pt then participated in therapeutic activity focused on fine motor skills. 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 OT adapted paint brushes using co-band for improved grip and addressed fine motor skills with painting [employer] on flat rocks surface. Pt with good manipulation of utensil with adaptations. Pt reported that she did not think she could participate in painting anymore and was happy to see that she could still do this with adaptations. Pt left semi-reclined in bed at end of session with bed alarm on, call bell in reach, and needs met.  Therapy Documentation Precautions:  Precautions Precautions: Fall, Cervical Precaution Booklet Issued: No Precaution Comments: verbal review of neck precautions when donning/doffing neck brace Required Braces or Orthoses: Cervical Brace Cervical Brace: Soft collar, Other (comment) (c-collar for bathing) Restrictions Weight Bearing Restrictions: No Other Position/Activity Restrictions: 2nd rib fx  Pain: Pain Assessment Pain Scale: 0-10 Pain Score: 0-No pain   Therapy/Group: Individual Therapy  Valma Cava 06/05/2022, 2:01 PM

## 2022-06-05 NOTE — Telephone Encounter (Signed)
[facility] [phone], where patient is located in [place]. I identified myself and spoke w/ nurse Ms. Baxter Kail about patient's transportation to her 1:30pm-06/05/22 radiation [place] on machine L-1 here at [facility]. Ms. Baxter Kail states "That she will set up transportation from [facility] for Ms. Jane Andrews and that she should be on time for her [place] appointment." I left my extension [phone] in case I am needed to help w/ patient. Verbal understanding of this conversation between myself Sharyn Lull) and Nurse Ms. Baxter Kail was established.

## 2022-06-06 LAB — COMPREHENSIVE METABOLIC PANEL
ALT: 11 U/L (ref 0–44)
AST: 15 U/L (ref 15–41)
Albumin: 3.4 g/dL — ABNORMAL LOW (ref 3.5–5.0)
Alkaline Phosphatase: 69 U/L (ref 38–126)
Anion gap: 9 (ref 5–15)
BUN: 35 mg/dL — ABNORMAL HIGH (ref 8–23)
CO2: 16 mmol/L — ABNORMAL LOW (ref 22–32)
Calcium: 8.9 mg/dL (ref 8.9–10.3)
Chloride: 113 mmol/L — ABNORMAL HIGH (ref 98–111)
Creatinine, Ser: 1.59 mg/dL — ABNORMAL HIGH (ref 0.44–1.00)
GFR, Estimated: 30 mL/min — ABNORMAL LOW (ref >60)
Glucose, Bld: 114 mg/dL — ABNORMAL HIGH (ref 70–99)
Potassium: 4.2 mmol/L (ref 3.5–5.1)
Sodium: 138 mmol/L (ref 135–145)
Total Bilirubin: 0.6 mg/dL (ref 0.3–1.2)
Total Protein: 6.2 g/dL — ABNORMAL LOW (ref 6.5–8.1)

## 2022-06-06 LAB — CBC
HCT: 31 % — ABNORMAL LOW (ref 36.0–46.0)
Hemoglobin: 10 g/dL — ABNORMAL LOW (ref 12.0–15.0)
MCH: 29.2 pg (ref 26.0–34.0)
MCHC: 32.3 g/dL (ref 30.0–36.0)
MCV: 90.6 fL (ref 80.0–100.0)
Platelets: 197 10*3/uL (ref 150–400)
RBC: 3.42 MIL/uL — ABNORMAL LOW (ref 3.87–5.11)
RDW: 14.7 % (ref 11.5–15.5)
WBC: 6.7 10*3/uL (ref 4.0–10.5)
nRBC: 0 % (ref 0.0–0.2)

## 2022-06-06 MED ORDER — LOPERAMIDE HCL 2 MG PO CAPS
4.0000 mg | ORAL_CAPSULE | ORAL | Status: DC | PRN
Start: 2022-06-06 — End: 2022-06-07

## 2022-06-06 MED ORDER — DICLOFENAC SODIUM 1 % EX GEL: 4.0000 g | Freq: Four times a day (QID) | CUTANEOUS | 0 refills | Status: DC

## 2022-06-06 MED ORDER — POLYSACCHARIDE IRON COMPLEX 150 MG PO CAPS: 150.0000 mg | ORAL_CAPSULE | Freq: Every day | ORAL | 0 refills | Status: DC

## 2022-06-06 MED ORDER — TRAMADOL HCL 50 MG PO TABS: 25.0000 mg | ORAL_TABLET | Freq: Four times a day (QID) | ORAL | 0 refills | Status: DC | PRN

## 2022-06-06 MED ORDER — METOPROLOL SUCCINATE ER 25 MG PO TB24: 25.0000 mg | ORAL_TABLET | Freq: Every morning | ORAL | 0 refills | Status: DC

## 2022-06-06 MED ORDER — ACETAMINOPHEN 325 MG PO TABS: 650.0000 mg | ORAL_TABLET | Freq: Four times a day (QID) | ORAL | Status: DC | PRN

## 2022-06-06 MED ORDER — LOPERAMIDE HCL 2 MG PO CAPS: 2.0000 mg | ORAL_CAPSULE | Freq: Four times a day (QID) | ORAL | 0 refills | Status: DC | PRN

## 2022-06-06 MED ORDER — VITAMIN D (ERGOCALCIFEROL) 1.25 MG (50000 UNIT) PO CAPS: 50000.0000 [IU] | ORAL_CAPSULE | ORAL | 0 refills | Status: DC

## 2022-06-06 MED ORDER — ACETAMINOPHEN 500 MG PO TABS: 500.0000 mg | ORAL_TABLET | Freq: Four times a day (QID) | ORAL | 0 refills | Status: DC

## 2022-06-06 MED ORDER — SODIUM CHLORIDE 0.9 % IV SOLN
INTRAVENOUS | Status: DC
Start: 2022-06-06 — End: 2022-06-06

## 2022-06-06 MED ORDER — POLYVINYL ALCOHOL 1.4 % OP SOLN: 1.0000 [drp] | OPHTHALMIC | 0 refills | Status: DC | PRN

## 2022-06-06 MED ORDER — PANTOPRAZOLE SODIUM 40 MG IV SOLR
40.0000 mg | Freq: Two times a day (BID) | INTRAVENOUS | Status: DC
  Administered 2022-06-07: 40 mg via INTRAVENOUS
  Filled 2022-06-06: qty 10

## 2022-06-06 MED ORDER — SODIUM CHLORIDE 0.9 % IV SOLN
INTRAVENOUS | Status: DC
Start: 2022-06-06 — End: 2022-06-07

## 2022-06-06 MED ORDER — TRAZODONE HCL 50 MG PO TABS: 25.0000 mg | ORAL_TABLET | Freq: Every day | ORAL | 0 refills | Status: DC

## 2022-06-06 MED ORDER — GABAPENTIN 100 MG PO CAPS: 200.0000 mg | ORAL_CAPSULE | Freq: Two times a day (BID) | ORAL | 1 refills | Status: DC

## 2022-06-06 MED ORDER — ALPRAZOLAM 0.25 MG PO TABS
0.2500 mg | ORAL_TABLET | Freq: Once | ORAL | Status: AC
  Administered 2022-06-06: 0.25 mg via ORAL
  Filled 2022-06-06: qty 1

## 2022-06-06 MED ORDER — ACETAMINOPHEN 500 MG PO TABS: 500.0000 mg | ORAL_TABLET | Freq: Three times a day (TID) | ORAL | Status: DC

## 2022-06-06 MED ORDER — METHOCARBAMOL 750 MG PO TABS: 750.0000 mg | ORAL_TABLET | Freq: Four times a day (QID) | ORAL | 0 refills | Status: DC

## 2022-06-06 MED ORDER — LOPERAMIDE HCL 2 MG PO CAPS
4.0000 mg | ORAL_CAPSULE | Freq: Once | ORAL | Status: AC
  Administered 2022-06-06: 4 mg via ORAL

## 2022-06-12 ENCOUNTER — Ambulatory Visit (INDEPENDENT_AMBULATORY_CARE_PROVIDER_SITE_OTHER): Payer: Medicare Other | Admitting: Urology

## 2022-06-12 ENCOUNTER — Encounter: Payer: Self-pay | Admitting: Urology

## 2022-06-12 VITALS — BP 108/64 | HR 110

## 2022-06-12 DIAGNOSIS — S066X9A Traumatic subarachnoid hemorrhage with loss of consciousness of unspecified duration, initial encounter: Secondary | ICD-10-CM | POA: Diagnosis not present

## 2022-06-12 DIAGNOSIS — E538 Deficiency of other specified B group vitamins: Secondary | ICD-10-CM | POA: Diagnosis not present

## 2022-06-12 DIAGNOSIS — I10 Essential (primary) hypertension: Secondary | ICD-10-CM | POA: Diagnosis not present

## 2022-06-12 DIAGNOSIS — C670 Malignant neoplasm of trigone of bladder: Secondary | ICD-10-CM

## 2022-06-12 DIAGNOSIS — M4712 Other spondylosis with myelopathy, cervical region: Secondary | ICD-10-CM | POA: Diagnosis not present

## 2022-06-12 DIAGNOSIS — I4891 Unspecified atrial fibrillation: Secondary | ICD-10-CM | POA: Diagnosis not present

## 2022-06-12 DIAGNOSIS — E063 Autoimmune thyroiditis: Secondary | ICD-10-CM | POA: Diagnosis not present

## 2022-06-12 DIAGNOSIS — N183 Chronic kidney disease, stage 3 unspecified: Secondary | ICD-10-CM | POA: Diagnosis not present

## 2022-06-12 DIAGNOSIS — Z681 Body mass index (BMI) 19 or less, adult: Secondary | ICD-10-CM | POA: Diagnosis not present

## 2022-06-12 NOTE — Progress Notes (Signed)
Assessment: 1. 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 female who presents for continued evaluation of dysuria.  She was initially seen in January 2023 for dysuria.  Her symptoms have been present for approximately 2 months.   Urine culture from 12/21/2021 grew 50-100 K mixed flora.   MDX culture from 01/07/2022 showed no organisms. Urine culture from 03/29/2022 grew 10-20 5K mixed flora. No gross hematuria.  She does have urgency, frequency. She continued to have intermittent dysuria, frequency, and incontinence.  No gross hematuria or flank pain. CT renal stone study from 01/26/2022 showed a stable staghorn calculus in the right kidney, tiny left lower pole renal calculi, and a cystic area in the right pelvis/adnexa. Cystoscopy from 4/23 demonstrated a sessile appearing bladder mass in the trigone area. Urine cytology showed dysplastic cells and was FISH positive. She underwent cystoscopy with transurethral resection of the bladder tumor on 04/16/2022.  The UOs were unable to be identified during the procedure.  Pathology showed high-grade urothelial carcinoma with squamous cell component (95%) with invasion of the muscularis propria.  She was discharged from the hospital on postoperative day #2.  Her creatinine remained fairly stable at 2.1 at the time of discharge.  At her visit on 04/23/2022, she was not having any gross hematuria at the present time.  She was voiding frequently and continued to have dysuria and bladder spasms.  No flank pain or abdominal pain.  No nausea or vomiting.  She had completed her  antibiotics. Creatinine from 04/23/2022 stable at 1.87. PET scan from 05/02/2022 showed irregular contour of the bladder consistent with the known bladder cancer, no evidence of metastatic disease, no evidence of ureteral obstruction. She underwent further management with palliative radiation therapy, completing a total of 8 treatments last week. She fell at home and sustained a head injury with intracranial hemorrhage.  She was admitted to the hospital and subsequently to inpatient rehab.  She is now back at home. She has been voiding spontaneously.  She reports some improvement in her dysuria.  She noted gross hematuria today.  No flank pain.  She continues to use Uribel prn and Myrbetriq 25 mg daily. She reports issues with diarrhea and rectal discomfort.  Portions of the above documentation were copied from a prior visit for review purposes only.  Allergies: Allergies  Allergen Reactions   Codeine Nausea And Vomiting    PMH: Past Medical History:  Diagnosis Date   Abdominal adhesions 1994   Allergic rhinitis    Anemia    Anxiety and depression    Aortic stenosis    Arthritis    Atrial fibrillation ([place]) 10/2012   Associated with severe anemia and esophageal pill impaction   Breast carcinoma (HCC)    Right mastectomy   Cholelithiasis    Essential hypertension    Gastroesophageal reflux disease    Hiatal hernia   History of blood transfusion    Hypothyroidism    Low back pain    Malabsorption    Short gut syndrome following small bowel resection surgery x2   Nephrolithiasis 2004   Painless hematuria  Short gut syndrome    Bowel resection , 2004   Upper GI bleed 2004   Multiple episodes of melena-? due to gastritis or adverse drug effect (nonsteroidals, small bowel ulceration with Fosamax); caused by Pepto-Bismol during one Emergency Department evaluation    PSH: Past Surgical History:  Procedure Laterality Date   ABDOMINAL HYSTERECTOMY     emergency s/p delivery    ABDOMINAL HYSTERECTOMY  1960   massive gynecologic bleeding   BOWEL RESECTION     Resulting short gut syndrome   CHOLECYSTECTOMY N/A 06/25/2018   Procedure: LAPAROSCOPIC CHOLECYSTECTOMY WITH INTRAOPERATIVE CHOLANGIOGRAM;  Surgeon: Armandina Gemma, MD;  Location: [facility];  Service: General;  Laterality: N/A;   COLONOSCOPY W/ POLYPECTOMY  2005   Lipoma; diverticulosis   COLONOSCOPY WITH ESOPHAGOGASTRODUODENOSCOPY (EGD)  11/22/2012   [facility]   CYSTOSCOPY W/ RETROGRADES Bilateral 04/16/2022   Procedure: CYSTOSCOPY;  Surgeon: Primus Bravo., MD;  Location: [facility];  Service: Urology;  Laterality: Bilateral;   ESOPHAGOGASTRODUODENOSCOPY (EGD) WITH PROPOFOL N/A 04/04/2022   Procedure: ESOPHAGOGASTRODUODENOSCOPY (EGD) WITH PROPOFOL;  Surgeon: Rogene Houston, MD;  [facility]: [facility];  Service: Endoscopy;  Laterality: N/A;  210   HIP ARTHROPLASTY Right 06/15/2020   Procedure: ANTERIOR ARTHROPLASTY BIPOLAR HIP (HEMIARTHROPLASTY);  Surgeon: Mcarthur Rossetti, MD;  Location: [facility];  Service: Orthopedics;  Laterality: Right;   LAPAROSCOPIC LYSIS OF ADHESIONS  1965   s/p adhesions   MASTECTOMY  right breast   MASTECTOMY     Carcinoma of the breast; right   TRANSURETHRAL RESECTION OF BLADDER TUMOR N/A 04/16/2022   Procedure: TRANSURETHRAL RESECTION OF BLADDER TUMOR (TURBT);  Surgeon: Primus Bravo., MD;  Location: [facility];  Service: Urology;  Laterality: N/A;   UPPER GASTROINTESTINAL ENDOSCOPY      SH: Social History   Tobacco Use   Smoking status: Former    Packs/day: 1.50    Years: 20.00    Total pack years: 30.00    Types: Cigarettes   Smokeless tobacco: Never  Vaping Use   Vaping Use: Never used  Substance Use Topics   Alcohol use: No   Drug use: No    ROS: Constitutional:  Negative for fever, chills, weight loss CV: Negative for chest pain, previous MI, hypertension Respiratory:  Negative for shortness of breath, wheezing, sleep apnea, frequent cough GI:  Negative for  nausea, vomiting, bloody stool, GERD  PE: BP 108/64   Pulse (!) 110  GENERAL APPEARANCE:  Well appearing, well developed, well nourished, NAD HEENT:  Atraumatic, normocephalic, oropharynx clear NECK:  In neck brace ABDOMEN:  Soft, non-tender, no masses EXTREMITIES:  Moves all extremities well, without clubbing, cyanosis, or edema NEUROLOGIC:  Alert and oriented x 3, in wheelchair,  CN II-XII grossly intact MENTAL STATUS:  appropriate BACK:  Non-tender to palpation, No CVAT SKIN:  Warm, dry, and intact   Results: Unable to provide specimen today

## 2022-06-12 NOTE — Addendum Note (Signed)
Addended byIris Pert on: 06/12/2022 05:35 PM   Modules accepted: Orders

## 2022-06-13 DIAGNOSIS — M419 Scoliosis, unspecified: Secondary | ICD-10-CM | POA: Diagnosis not present

## 2022-06-13 DIAGNOSIS — S14125D Central cord syndrome at C5 level of cervical spinal cord, subsequent encounter: Secondary | ICD-10-CM | POA: Diagnosis not present

## 2022-06-13 DIAGNOSIS — I4891 Unspecified atrial fibrillation: Secondary | ICD-10-CM | POA: Diagnosis not present

## 2022-06-13 DIAGNOSIS — D509 Iron deficiency anemia, unspecified: Secondary | ICD-10-CM | POA: Diagnosis not present

## 2022-06-13 DIAGNOSIS — N184 Chronic kidney disease, stage 4 (severe): Secondary | ICD-10-CM | POA: Diagnosis not present

## 2022-06-13 DIAGNOSIS — E039 Hypothyroidism, unspecified: Secondary | ICD-10-CM | POA: Diagnosis not present

## 2022-06-13 DIAGNOSIS — M4126 Other idiopathic scoliosis, lumbar region: Secondary | ICD-10-CM | POA: Diagnosis not present

## 2022-06-13 DIAGNOSIS — E538 Deficiency of other specified B group vitamins: Secondary | ICD-10-CM | POA: Diagnosis not present

## 2022-06-13 DIAGNOSIS — M800AXD Age-related osteoporosis with current pathological fracture, other site, subsequent encounter for fracture with routine healing: Secondary | ICD-10-CM | POA: Diagnosis not present

## 2022-06-13 DIAGNOSIS — M4317 Spondylolisthesis, lumbosacral region: Secondary | ICD-10-CM | POA: Diagnosis not present

## 2022-06-13 DIAGNOSIS — S14124D Central cord syndrome at C4 level of cervical spinal cord, subsequent encounter: Secondary | ICD-10-CM | POA: Diagnosis not present

## 2022-06-13 DIAGNOSIS — R197 Diarrhea, unspecified: Secondary | ICD-10-CM | POA: Diagnosis not present

## 2022-06-13 DIAGNOSIS — M47812 Spondylosis without myelopathy or radiculopathy, cervical region: Secondary | ICD-10-CM | POA: Diagnosis not present

## 2022-06-13 DIAGNOSIS — S14123D Central cord syndrome at C3 level of cervical spinal cord, subsequent encounter: Secondary | ICD-10-CM | POA: Diagnosis not present

## 2022-06-13 DIAGNOSIS — M4312 Spondylolisthesis, cervical region: Secondary | ICD-10-CM | POA: Diagnosis not present

## 2022-06-13 DIAGNOSIS — I350 Nonrheumatic aortic (valve) stenosis: Secondary | ICD-10-CM | POA: Diagnosis not present

## 2022-06-13 DIAGNOSIS — D631 Anemia in chronic kidney disease: Secondary | ICD-10-CM | POA: Diagnosis not present

## 2022-06-13 DIAGNOSIS — G250 Essential tremor: Secondary | ICD-10-CM | POA: Diagnosis not present

## 2022-06-13 DIAGNOSIS — E875 Hyperkalemia: Secondary | ICD-10-CM | POA: Diagnosis not present

## 2022-06-13 DIAGNOSIS — M47816 Spondylosis without myelopathy or radiculopathy, lumbar region: Secondary | ICD-10-CM | POA: Diagnosis not present

## 2022-06-13 DIAGNOSIS — C679 Malignant neoplasm of bladder, unspecified: Secondary | ICD-10-CM | POA: Diagnosis not present

## 2022-06-13 DIAGNOSIS — D630 Anemia in neoplastic disease: Secondary | ICD-10-CM | POA: Diagnosis not present

## 2022-06-13 DIAGNOSIS — I129 Hypertensive chronic kidney disease with stage 1 through stage 4 chronic kidney disease, or unspecified chronic kidney disease: Secondary | ICD-10-CM | POA: Diagnosis not present

## 2022-06-13 DIAGNOSIS — S06300D Unspecified focal traumatic brain injury without loss of consciousness, subsequent encounter: Secondary | ICD-10-CM | POA: Diagnosis not present

## 2022-06-13 DIAGNOSIS — M4802 Spinal stenosis, cervical region: Secondary | ICD-10-CM | POA: Diagnosis not present

## 2022-06-16 DIAGNOSIS — S14123D Central cord syndrome at C3 level of cervical spinal cord, subsequent encounter: Secondary | ICD-10-CM | POA: Diagnosis not present

## 2022-06-16 DIAGNOSIS — S14125D Central cord syndrome at C5 level of cervical spinal cord, subsequent encounter: Secondary | ICD-10-CM | POA: Diagnosis not present

## 2022-06-16 DIAGNOSIS — I129 Hypertensive chronic kidney disease with stage 1 through stage 4 chronic kidney disease, or unspecified chronic kidney disease: Secondary | ICD-10-CM | POA: Diagnosis not present

## 2022-06-16 DIAGNOSIS — M800AXD Age-related osteoporosis with current pathological fracture, other site, subsequent encounter for fracture with routine healing: Secondary | ICD-10-CM | POA: Diagnosis not present

## 2022-06-16 DIAGNOSIS — S06300D Unspecified focal traumatic brain injury without loss of consciousness, subsequent encounter: Secondary | ICD-10-CM | POA: Diagnosis not present

## 2022-06-16 DIAGNOSIS — S14124D Central cord syndrome at C4 level of cervical spinal cord, subsequent encounter: Secondary | ICD-10-CM | POA: Diagnosis not present

## 2022-06-18 DIAGNOSIS — I129 Hypertensive chronic kidney disease with stage 1 through stage 4 chronic kidney disease, or unspecified chronic kidney disease: Secondary | ICD-10-CM | POA: Diagnosis not present

## 2022-06-18 DIAGNOSIS — S14123D Central cord syndrome at C3 level of cervical spinal cord, subsequent encounter: Secondary | ICD-10-CM | POA: Diagnosis not present

## 2022-06-18 DIAGNOSIS — S14124D Central cord syndrome at C4 level of cervical spinal cord, subsequent encounter: Secondary | ICD-10-CM | POA: Diagnosis not present

## 2022-06-18 DIAGNOSIS — S06300D Unspecified focal traumatic brain injury without loss of consciousness, subsequent encounter: Secondary | ICD-10-CM | POA: Diagnosis not present

## 2022-06-18 DIAGNOSIS — M800AXD Age-related osteoporosis with current pathological fracture, other site, subsequent encounter for fracture with routine healing: Secondary | ICD-10-CM | POA: Diagnosis not present

## 2022-06-18 DIAGNOSIS — S14125D Central cord syndrome at C5 level of cervical spinal cord, subsequent encounter: Secondary | ICD-10-CM | POA: Diagnosis not present

## 2022-06-20 DIAGNOSIS — I129 Hypertensive chronic kidney disease with stage 1 through stage 4 chronic kidney disease, or unspecified chronic kidney disease: Secondary | ICD-10-CM | POA: Diagnosis not present

## 2022-06-20 DIAGNOSIS — M800AXD Age-related osteoporosis with current pathological fracture, other site, subsequent encounter for fracture with routine healing: Secondary | ICD-10-CM | POA: Diagnosis not present

## 2022-06-20 DIAGNOSIS — S14125D Central cord syndrome at C5 level of cervical spinal cord, subsequent encounter: Secondary | ICD-10-CM | POA: Diagnosis not present

## 2022-06-20 DIAGNOSIS — S14124D Central cord syndrome at C4 level of cervical spinal cord, subsequent encounter: Secondary | ICD-10-CM | POA: Diagnosis not present

## 2022-06-20 DIAGNOSIS — S14123D Central cord syndrome at C3 level of cervical spinal cord, subsequent encounter: Secondary | ICD-10-CM | POA: Diagnosis not present

## 2022-06-20 DIAGNOSIS — S06300D Unspecified focal traumatic brain injury without loss of consciousness, subsequent encounter: Secondary | ICD-10-CM | POA: Diagnosis not present

## 2022-06-23 DIAGNOSIS — S14125D Central cord syndrome at C5 level of cervical spinal cord, subsequent encounter: Secondary | ICD-10-CM | POA: Diagnosis not present

## 2022-06-23 DIAGNOSIS — M800AXD Age-related osteoporosis with current pathological fracture, other site, subsequent encounter for fracture with routine healing: Secondary | ICD-10-CM | POA: Diagnosis not present

## 2022-06-23 DIAGNOSIS — S14124D Central cord syndrome at C4 level of cervical spinal cord, subsequent encounter: Secondary | ICD-10-CM | POA: Diagnosis not present

## 2022-06-23 DIAGNOSIS — S06300D Unspecified focal traumatic brain injury without loss of consciousness, subsequent encounter: Secondary | ICD-10-CM | POA: Diagnosis not present

## 2022-06-23 DIAGNOSIS — S14123D Central cord syndrome at C3 level of cervical spinal cord, subsequent encounter: Secondary | ICD-10-CM | POA: Diagnosis not present

## 2022-06-23 DIAGNOSIS — I129 Hypertensive chronic kidney disease with stage 1 through stage 4 chronic kidney disease, or unspecified chronic kidney disease: Secondary | ICD-10-CM | POA: Diagnosis not present

## 2022-06-24 ENCOUNTER — Telehealth: Payer: Self-pay

## 2022-06-24 ENCOUNTER — Other Ambulatory Visit: Payer: Medicare Other

## 2022-06-24 DIAGNOSIS — N3000 Acute cystitis without hematuria: Secondary | ICD-10-CM

## 2022-06-24 DIAGNOSIS — Z87440 Personal history of urinary (tract) infections: Secondary | ICD-10-CM

## 2022-06-24 LAB — MICROSCOPIC EXAMINATION
RBC, Urine: >30 /hpf — AB (ref 0–2)
Renal Epithel, UA: NONE SEEN /hpf
WBC, UA: >30 /hpf — AB (ref 0–5)

## 2022-06-24 LAB — URINALYSIS, ROUTINE W REFLEX MICROSCOPIC
Bilirubin, UA: NEGATIVE
Glucose, UA: NEGATIVE
Ketones, UA: NEGATIVE
Nitrite, UA: NEGATIVE
Specific Gravity, UA: 1.01 (ref 1.005–1.030)
Urobilinogen, Ur: 0.2 mg/dL (ref 0.2–1.0)
pH, UA: 6 (ref 5.0–7.5)

## 2022-06-24 NOTE — Telephone Encounter (Signed)
Open in error

## 2022-06-24 NOTE — Telephone Encounter (Signed)
Patient's daughter Vanessa Barbara - [phone]  Dropped off urine.  Is burning during urination. Please call [name] back of results.  Thanks, Helene Kelp

## 2022-06-24 NOTE — Telephone Encounter (Signed)
Patient drop urine off today U/A and U/C ordered

## 2022-06-26 ENCOUNTER — Other Ambulatory Visit: Payer: Self-pay | Admitting: Urology

## 2022-06-26 DIAGNOSIS — R300 Dysuria: Secondary | ICD-10-CM

## 2022-06-26 LAB — URINE CULTURE

## 2022-06-26 MED ORDER — CEFDINIR 300 MG PO CAPS: 300.0000 mg | ORAL_CAPSULE | Freq: Two times a day (BID) | ORAL | 0 refills | Status: AC

## 2022-06-26 NOTE — Telephone Encounter (Signed)
-----   Message from Primus Bravo, MD sent at 06/26/2022  9:05 AM EDT ----- Will start Cefdinir 300 mg BID since she is symptomatic.

## 2022-06-26 NOTE — Telephone Encounter (Signed)
Patient caregiver notified of prescription sent to pharmacy and to call office if symptoms do not improve in 5-7 days. Caregiver voiced understanding.

## 2022-06-27 DIAGNOSIS — S14125D Central cord syndrome at C5 level of cervical spinal cord, subsequent encounter: Secondary | ICD-10-CM | POA: Diagnosis not present

## 2022-06-27 DIAGNOSIS — S14124D Central cord syndrome at C4 level of cervical spinal cord, subsequent encounter: Secondary | ICD-10-CM | POA: Diagnosis not present

## 2022-06-27 DIAGNOSIS — S06300D Unspecified focal traumatic brain injury without loss of consciousness, subsequent encounter: Secondary | ICD-10-CM | POA: Diagnosis not present

## 2022-06-27 DIAGNOSIS — M800AXD Age-related osteoporosis with current pathological fracture, other site, subsequent encounter for fracture with routine healing: Secondary | ICD-10-CM | POA: Diagnosis not present

## 2022-06-27 DIAGNOSIS — S14123D Central cord syndrome at C3 level of cervical spinal cord, subsequent encounter: Secondary | ICD-10-CM | POA: Diagnosis not present

## 2022-06-27 DIAGNOSIS — I129 Hypertensive chronic kidney disease with stage 1 through stage 4 chronic kidney disease, or unspecified chronic kidney disease: Secondary | ICD-10-CM | POA: Diagnosis not present

## 2022-06-30 DIAGNOSIS — S14123D Central cord syndrome at C3 level of cervical spinal cord, subsequent encounter: Secondary | ICD-10-CM | POA: Diagnosis not present

## 2022-06-30 DIAGNOSIS — S14124D Central cord syndrome at C4 level of cervical spinal cord, subsequent encounter: Secondary | ICD-10-CM | POA: Diagnosis not present

## 2022-06-30 DIAGNOSIS — S14125D Central cord syndrome at C5 level of cervical spinal cord, subsequent encounter: Secondary | ICD-10-CM | POA: Diagnosis not present

## 2022-06-30 DIAGNOSIS — S06300D Unspecified focal traumatic brain injury without loss of consciousness, subsequent encounter: Secondary | ICD-10-CM | POA: Diagnosis not present

## 2022-06-30 DIAGNOSIS — I129 Hypertensive chronic kidney disease with stage 1 through stage 4 chronic kidney disease, or unspecified chronic kidney disease: Secondary | ICD-10-CM | POA: Diagnosis not present

## 2022-06-30 DIAGNOSIS — M800AXD Age-related osteoporosis with current pathological fracture, other site, subsequent encounter for fracture with routine healing: Secondary | ICD-10-CM | POA: Diagnosis not present

## 2022-07-03 ENCOUNTER — Other Ambulatory Visit: Payer: Self-pay | Admitting: Student

## 2022-07-03 ENCOUNTER — Other Ambulatory Visit (HOSPITAL_COMMUNITY): Payer: Self-pay | Admitting: Student

## 2022-07-03 DIAGNOSIS — R202 Paresthesia of skin: Secondary | ICD-10-CM | POA: Diagnosis not present

## 2022-07-03 DIAGNOSIS — M4312 Spondylolisthesis, cervical region: Secondary | ICD-10-CM | POA: Diagnosis not present

## 2022-07-03 DIAGNOSIS — R200 Anesthesia of skin: Secondary | ICD-10-CM | POA: Diagnosis not present

## 2022-07-04 ENCOUNTER — Inpatient Hospital Stay: Payer: Medicare Other | Admitting: Physical Medicine & Rehabilitation

## 2022-07-04 ENCOUNTER — Ambulatory Visit (INDEPENDENT_AMBULATORY_CARE_PROVIDER_SITE_OTHER): Payer: Medicare Other | Admitting: Urology

## 2022-07-04 ENCOUNTER — Encounter: Payer: Self-pay | Admitting: Urology

## 2022-07-04 VITALS — BP 126/66 | HR 93

## 2022-07-04 DIAGNOSIS — C670 Malignant neoplasm of trigone of bladder: Secondary | ICD-10-CM

## 2022-07-04 DIAGNOSIS — R300 Dysuria: Secondary | ICD-10-CM

## 2022-07-04 LAB — BLADDER SCAN AMB NON-IMAGING: Scan Result: 0

## 2022-07-04 NOTE — Progress Notes (Signed)
She   Assessment: 1. 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 Dysuria      Plan: She is unable to provide a urine sample today.  She will bring a sample by the office on Monday for evaluation.  If the urine is abnormal, will proceed with a resolve MDX urine culture. Continue Uribel prn Trial of Myrbetriq 50 mg daily.  Samples given. Return to office in 1 month.  Chief Complaint: Chief Complaint  Patient presents with   Dysuria    HPI: Jane Andrews is a 88 y.o. female who presents for continued evaluation of dysuria.  She was initially seen in January 2023 for dysuria.  Her symptoms have been present for approximately 2 months.   Urine culture from 12/21/2021 grew 50-100 K mixed flora.   MDX culture from 01/07/2022 showed no organisms. Urine culture from 03/29/2022 grew 10-20 5K mixed flora. No gross hematuria.  She does have urgency, frequency. She continued to have intermittent dysuria, frequency, and incontinence.  No gross hematuria or flank pain. CT renal stone study from 01/26/2022 showed a stable staghorn calculus in the right kidney, tiny left lower pole renal calculi, and a cystic area in the right pelvis/adnexa. Cystoscopy from 4/23 demonstrated a sessile appearing bladder mass in the trigone area. Urine cytology showed dysplastic cells and was FISH positive. She underwent cystoscopy with transurethral resection of the bladder tumor on 04/16/2022.  The UOs were unable to be identified during the procedure.  Pathology showed high-grade urothelial carcinoma with squamous cell component (95%) with invasion of the muscularis propria.  She was discharged from the hospital on postoperative day #2.  Her creatinine remained fairly stable at 2.1 at the time of discharge.  At her visit on 04/23/2022, she was not having any gross hematuria at the present time.  She was voiding frequently  and continued to have dysuria and bladder spasms.  No flank pain or abdominal pain.  No nausea or vomiting.  She had completed her antibiotics. Creatinine from 04/23/2022 stable at 1.87. PET scan from 05/02/2022 showed irregular contour of the bladder consistent with the known bladder cancer, no evidence of metastatic disease, no evidence of ureteral obstruction. She underwent further management with palliative radiation therapy, completing a total of 8 treatments on 06/05/22.   She fell at home and sustained a head injury with intracranial hemorrhage.  She was admitted to the hospital and subsequently to inpatient rehab.  She is now back at home. She has been voiding spontaneously.  She reported some improvement in her dysuria. She continued to use Uribel prn and Myrbetriq 25 mg daily. She was evaluated for increased dysuria.  A urinalysis from 06/24/2022 showed >30 WBCs, >30 RBCs, and moderate bacteria.  Urine culture grew 50-100 K of >2 organisms.  She was treated with cefdinir.  She completed the antibiotics yesterday.  She continued to have dysuria until this morning.  She is having continued frequency and increased urinary incontinence.  No gross hematuria.  Portions of the above documentation were copied from a prior visit for review purposes only.  Allergies: Allergies  Allergen Reactions   Codeine Nausea And Vomiting    PMH: Past Medical History:  Diagnosis Date   Abdominal adhesions 1994   Allergic rhinitis    Anemia    Anxiety and depression    Aortic stenosis    Arthritis    Atrial fibrillation ([place]) 10/2012  Associated with severe anemia and esophageal pill impaction   Breast carcinoma (HCC)    Right mastectomy   Cholelithiasis    Essential hypertension    Gastroesophageal reflux disease    Hiatal hernia   History of blood transfusion    Hypothyroidism    Low back pain    Malabsorption    Short gut syndrome following small bowel resection surgery x2    Nephrolithiasis 2004   Painless hematuria   Short gut syndrome    Bowel resection , 2004   Upper GI bleed 2004   Multiple episodes of melena-? due to gastritis or adverse drug effect (nonsteroidals, small bowel ulceration with Fosamax); caused by Pepto-Bismol during one Emergency Department evaluation    PSH: Past Surgical History:  Procedure Laterality Date   ABDOMINAL HYSTERECTOMY     emergency s/p delivery   ABDOMINAL HYSTERECTOMY  1960   massive gynecologic bleeding   BOWEL RESECTION     Resulting short gut syndrome   CHOLECYSTECTOMY N/A 06/25/2018   Procedure: LAPAROSCOPIC CHOLECYSTECTOMY WITH INTRAOPERATIVE CHOLANGIOGRAM;  Surgeon: Armandina Gemma, MD;  [facility]: [facility];  Service: General;  Laterality: N/A;   COLONOSCOPY W/ POLYPECTOMY  2005   Lipoma; diverticulosis   COLONOSCOPY WITH ESOPHAGOGASTRODUODENOSCOPY (EGD)  11/22/2012   [facility]   CYSTOSCOPY W/ RETROGRADES Bilateral 04/16/2022   Procedure: CYSTOSCOPY;  Surgeon: Primus Bravo., MD;  Location: [facility];  Service: Urology;  Laterality: Bilateral;   ESOPHAGOGASTRODUODENOSCOPY (EGD) WITH PROPOFOL N/A 04/04/2022   Procedure: ESOPHAGOGASTRODUODENOSCOPY (EGD) WITH PROPOFOL;  Surgeon: Rogene Houston, MD;  [facility];  Service: Endoscopy;  Laterality: N/A;  210   HIP ARTHROPLASTY Right 06/15/2020   Procedure: ANTERIOR ARTHROPLASTY BIPOLAR HIP (HEMIARTHROPLASTY);  Surgeon: Mcarthur Rossetti, MD;  Location: [facility];  Service: Orthopedics;  Laterality: Right;   LAPAROSCOPIC LYSIS OF ADHESIONS  1965   s/p adhesions   MASTECTOMY  right breast   MASTECTOMY     Carcinoma of the breast; right   TRANSURETHRAL RESECTION OF BLADDER TUMOR N/A 04/16/2022   Procedure: TRANSURETHRAL RESECTION OF BLADDER TUMOR (TURBT);  Surgeon: Primus Bravo., MD;  Location: [facility];  Service: Urology;  Laterality: N/A;   UPPER GASTROINTESTINAL ENDOSCOPY      SH: Social History   Tobacco Use   Smoking status: Former    Packs/day:  1.50    Years: 20.00    Total pack years: 30.00    Types: Cigarettes   Smokeless tobacco: Never  Vaping Use   Vaping Use: Never used  Substance Use Topics   Alcohol use: No   Drug use: No    ROS: Constitutional:  Negative for fever, chills, weight loss CV: Negative for chest pain, previous MI, hypertension Respiratory:  Negative for shortness of breath, wheezing, sleep apnea, frequent cough GI:  Negative for nausea, vomiting, bloody stool, GERD  PE: BP 126/66   Pulse 93  GENERAL APPEARANCE:  Well appearing, well developed, well nourished, NAD HEENT:  Atraumatic, normocephalic, oropharynx clear NECK:  Supple without lymphadenopathy or thyromegaly ABDOMEN:  Soft, non-tender, no masses EXTREMITIES:  Moves all extremities well, without clubbing, cyanosis, or edema NEUROLOGIC:  Alert and oriented x 3, in wheelchair, CN II-XII grossly intact MENTAL STATUS:  appropriate BACK:  Non-tender to palpation, No CVAT SKIN:  Warm, dry, and intact   Results: No specimen provided  Bladder scan:  0 ml

## 2022-07-07 DIAGNOSIS — I129 Hypertensive chronic kidney disease with stage 1 through stage 4 chronic kidney disease, or unspecified chronic kidney disease: Secondary | ICD-10-CM | POA: Diagnosis not present

## 2022-07-07 DIAGNOSIS — S14123D Central cord syndrome at C3 level of cervical spinal cord, subsequent encounter: Secondary | ICD-10-CM | POA: Diagnosis not present

## 2022-07-07 DIAGNOSIS — S14125D Central cord syndrome at C5 level of cervical spinal cord, subsequent encounter: Secondary | ICD-10-CM | POA: Diagnosis not present

## 2022-07-07 DIAGNOSIS — M800AXD Age-related osteoporosis with current pathological fracture, other site, subsequent encounter for fracture with routine healing: Secondary | ICD-10-CM | POA: Diagnosis not present

## 2022-07-07 DIAGNOSIS — S06300D Unspecified focal traumatic brain injury without loss of consciousness, subsequent encounter: Secondary | ICD-10-CM | POA: Diagnosis not present

## 2022-07-07 DIAGNOSIS — S14124D Central cord syndrome at C4 level of cervical spinal cord, subsequent encounter: Secondary | ICD-10-CM | POA: Diagnosis not present

## 2022-07-09 ENCOUNTER — Other Ambulatory Visit: Payer: Medicare Other

## 2022-07-09 DIAGNOSIS — Z87440 Personal history of urinary (tract) infections: Secondary | ICD-10-CM

## 2022-07-09 DIAGNOSIS — R300 Dysuria: Secondary | ICD-10-CM | POA: Diagnosis not present

## 2022-07-09 LAB — URINALYSIS, ROUTINE W REFLEX MICROSCOPIC
Bilirubin, UA: NEGATIVE
Glucose, UA: NEGATIVE
Ketones, UA: NEGATIVE
Nitrite, UA: NEGATIVE
Specific Gravity, UA: 1.015 (ref 1.005–1.030)
Urobilinogen, Ur: 0.2 mg/dL (ref 0.2–1.0)
pH, UA: 6 (ref 5.0–7.5)

## 2022-07-09 LAB — MICROSCOPIC EXAMINATION
Renal Epithel, UA: NONE SEEN /hpf
WBC, UA: >30 /hpf — AB (ref 0–5)

## 2022-07-10 ENCOUNTER — Encounter: Payer: Self-pay | Admitting: Physical Medicine & Rehabilitation

## 2022-07-10 ENCOUNTER — Encounter: Payer: Medicare Other | Attending: Physical Medicine & Rehabilitation | Admitting: Physical Medicine & Rehabilitation

## 2022-07-10 ENCOUNTER — Ambulatory Visit: Payer: Self-pay | Admitting: Urology

## 2022-07-10 VITALS — BP 154/76 | HR 82 | Ht 61.0 in | Wt 109.8 lb

## 2022-07-10 DIAGNOSIS — R197 Diarrhea, unspecified: Secondary | ICD-10-CM | POA: Insufficient documentation

## 2022-07-10 DIAGNOSIS — S02119D Unspecified fracture of occiput, subsequent encounter for fracture with routine healing: Secondary | ICD-10-CM | POA: Insufficient documentation

## 2022-07-10 DIAGNOSIS — M4802 Spinal stenosis, cervical region: Secondary | ICD-10-CM | POA: Insufficient documentation

## 2022-07-10 DIAGNOSIS — S14129D Central cord syndrome at unspecified level of cervical spinal cord, subsequent encounter: Secondary | ICD-10-CM | POA: Diagnosis not present

## 2022-07-10 DIAGNOSIS — M792 Neuralgia and neuritis, unspecified: Secondary | ICD-10-CM | POA: Insufficient documentation

## 2022-07-10 MED ORDER — GABAPENTIN 100 MG PO CAPS: 200.0000 mg | ORAL_CAPSULE | Freq: Three times a day (TID) | ORAL | 2 refills | Status: DC

## 2022-07-10 NOTE — Progress Notes (Signed)
Subjective:    Patient ID: Jane Andrews, female    DOB: Nov 23, 1925, 88 y.o.   MRN: [id number]  HPI Brief HPI:   Jane Andrews is a 88 y.o. female with history of AF, right breast cancer, CKD IV, right staghorn calculus, TURBT for bladder cancer, short gut syndrome who was admitted on 05/13/22 after found on the floor by a neighbor with laceration on the back of her head. She was found to have TBI with reports of HA, and dizziness with head turns. Work up revealed bilateral frontal SAH, right temporal ICH and left tentorium SDH, right 2nd rib fracture and severe stenosis C3/4 with advanced DDD C3-C7.  She reported numbness and tingling bilateral hands with sandpaper feeling and C spine MRI showed thin hairline fracture involving left occipital calvarium and extending towards jugular foramen as well as moderate to severe stenosis C3/4 and C4/5 with flattening of cord without signal changes.    Dr. Trenton Gammon recommended conservative management of Jane Andrews and C collar for support.  Mild weakness in grips with patchy sensory loss felt to be secondary to myelopathy in setting of severe spinal stenosis. Palliative care consulted and patient elected on DNR. She continues to be limited by pain, ataxic gait with left lean, decreased recall with problems sequencing as well as impaired sensation bilateral feet. PT/OT has been working with patient and CIR recommended due to functional decline.      Hospital Course: Jane Andrews was admitted to rehab 05/17/2022 for inpatient therapies to consist of PT, ST and OT at least three hours five days a week. Past admission physiatrist, therapy team and rehab RN have worked together to provide customized collaborative inpatient rehab.  Pertaining to patient's central cord syndrome as well as TBI.  Patient attending therapies [place] collar as directed.  Patient had been cleared to begin Lovenox for DVT prophylaxis no bleeding episodes.  Pain managed with use  of Cymbalta Neurontin as well as tramadol as needed.  Gabapentin was adjusted to 200 mg twice daily.  Mood stabilization patient will continue Cymbalta as well as Xanax that she had been on prior to admission.  Seizure prophylaxis Keppra completed no seizure activity.  Left occipital calvarium fracture soft collar at all times with follow-up per neurosurgery.  No ecchymosis or drainage.  Site clean and dry.  Severe cervical stenosis/neuropathy/myelopathy due to fall again gabapentin was adjusted.  Still has some sensory symptoms in the hands.  CKD stage IV followed by Dr.Bhutani with latest creatinine 1.59.  Iron deficiency anemia iron supplement as directed latest hemoglobin 10.0 and stable.  Patient with history of TURBT 04/16/2022 for invasive high-grade bladder cancer palliative XRT at [facility].  Chronic diarrhea for years due to short gut syndrome Imodium as directed.  Patient did have occasional bouts of nausea and monitoring.  BPPV change meclizine to 3 times daily as needed vestibular treatment limited due to cervical issues.  Patient did have mechanical fall unwitnessed early morning of 06/01/2022 x-rays of C-spine and L-spine showed no acute abnormalities.  CT of the head showed chronic small vessel disease no acute abnormality.   Interval History 88 year old female who is here for follow-up after completing rehabilitation at [facility].  Patient has been doing well overall since discharging home.  She continues to have altered sensation in her hands and reports that items feel like sandpaper when she touches them.  She reports this is uncomfortable and painful.  Functionally she has been doing well and has been  walking at home with a walker.  She has not had any additional falls.  She has had improvement in her strength of her arms and legs.  She is working with OT and PT and home and reports this is going well.  She continues to follow with urology for her bladder cancer.  She is also followed with  a PCP recently.  Additionally she saw Dr. Trenton Gammon last week, she says she was told to continue her cervical collar.   Pain Inventory Average Pain 4 Pain Right Now 6 My pain is constant and throbs  LOCATION OF PAIN  hand  BOWEL Number of stools per week: 2-3 per day  BLADDER Normal and Pads    Mobility walk with assistance use a walker ability to climb steps?  no do you drive?  no use a wheelchair needs help with transfers Do you have any goals in this area?  yes  Function retired  Neuro/Psych numbness tingling trouble walking depression  Prior Studies Any changes since last visit?  no  Physicians involved in your care Any changes since last visit?  no   Family History  Problem Relation Age of Onset   Anuerysm Father    Rheum arthritis Sister    Healthy Sister    COPD Sister    Healthy Brother    Cancer Other    Colon cancer Neg Hx    Social History   Socioeconomic History   Marital status: Widowed    Spouse name: Not on file   Number of children: Not on file   Years of education: 12   Highest education level: Not on file  Occupational History   Not on file  Tobacco Use   Smoking status: Former    Packs/day: 1.50    Years: 20.00    Total pack years: 30.00    Types: Cigarettes   Smokeless tobacco: Never  Vaping Use   Vaping Use: Never used  Substance and Sexual Activity   Alcohol use: No   Drug use: No   Sexual activity: Not Currently    Birth control/protection: Post-menopausal, Surgical    Comment: hyst  Other Topics Concern   Not on file  Social History Narrative   Not on file   Social Determinants of Health   Financial Resource Strain: Not on file  Food Insecurity: Not on file  Transportation Needs: Not on file  Physical Activity: Not on file  Stress: Not on file  Social Connections: Not on file   Past Surgical History:  Procedure Laterality Date   ABDOMINAL HYSTERECTOMY     emergency s/p delivery   ABDOMINAL HYSTERECTOMY   1960   massive gynecologic bleeding   BOWEL RESECTION     Resulting short gut syndrome   CHOLECYSTECTOMY N/A 06/25/2018   Procedure: LAPAROSCOPIC CHOLECYSTECTOMY WITH INTRAOPERATIVE CHOLANGIOGRAM;  Surgeon: Armandina Gemma, MD;  Location: [facility];  Service: General;  Laterality: N/A;   COLONOSCOPY W/ POLYPECTOMY  2005   Lipoma; diverticulosis   COLONOSCOPY WITH ESOPHAGOGASTRODUODENOSCOPY (EGD)  11/22/2012   [facility]   CYSTOSCOPY W/ RETROGRADES Bilateral 04/16/2022   Procedure: CYSTOSCOPY;  Surgeon: Primus Bravo., MD;  Location: [facility];  Service: Urology;  Laterality: Bilateral;   ESOPHAGOGASTRODUODENOSCOPY (EGD) WITH PROPOFOL N/A 04/04/2022   Procedure: ESOPHAGOGASTRODUODENOSCOPY (EGD) WITH PROPOFOL;  Surgeon: Rogene Houston, MD;  Location: [facility];  Service: Endoscopy;  Laterality: N/A;  210   HIP ARTHROPLASTY Right 06/15/2020   Procedure: ANTERIOR ARTHROPLASTY BIPOLAR HIP (HEMIARTHROPLASTY);  Surgeon: Ninfa Linden,  Lind Guest, MD;  Location: [facility];  Service: Orthopedics;  Laterality: Right;   LAPAROSCOPIC LYSIS OF ADHESIONS  1965   s/p adhesions   MASTECTOMY  right breast   MASTECTOMY     Carcinoma of the breast; right   TRANSURETHRAL RESECTION OF BLADDER TUMOR N/A 04/16/2022   Procedure: TRANSURETHRAL RESECTION OF BLADDER TUMOR (TURBT);  Surgeon: Primus Bravo., MD;  Location: [facility];  Service: Urology;  Laterality: N/A;   UPPER GASTROINTESTINAL ENDOSCOPY     Past Medical History:  Diagnosis Date   Abdominal adhesions 1994   Allergic rhinitis    Anemia    Anxiety and depression    Aortic stenosis    Arthritis    Atrial fibrillation ([place]) 10/2012   Associated with severe anemia and esophageal pill impaction   Breast carcinoma (HCC)    Right mastectomy   Cholelithiasis    Essential hypertension    Gastroesophageal reflux disease    Hiatal hernia   History of blood transfusion    Hypothyroidism    Low back pain    Malabsorption    Short gut syndrome following  small bowel resection surgery x2   Nephrolithiasis 2004   Painless hematuria   Short gut syndrome    Bowel resection , 2004   Upper GI bleed 2004   Multiple episodes of melena-? due to gastritis or adverse drug effect (nonsteroidals, small bowel ulceration with Fosamax); caused by Pepto-Bismol during one Emergency Department evaluation   BP (!) 154/76   Pulse 82   Ht '5\' 1"'$  (1.549 m)   Wt 109 lb 12.8 oz (49.8 kg)   SpO2 95%   BMI 20.75 kg/m   Opioid Risk Score:   Fall Risk Score:  `1  Depression screen [facility] 2/9     07/10/2022    1:57 PM  Depression screen PHQ 2/9  Decreased Interest 3  Down, Depressed, Hopeless 3  PHQ - 2 Score 6  Altered sleeping 3  Tired, decreased energy 3  Change in appetite 3  Feeling bad or failure about yourself  3  Trouble concentrating 3  Moving slowly or fidgety/restless 0  Suicidal thoughts 0  PHQ-9 Score 21    Review of Systems  Constitutional:  Positive for appetite change (poor).       Weight loss since Hospital visit. Currently gaining.  Gastrointestinal:  Positive for diarrhea.  Musculoskeletal:  Positive for gait problem.  Neurological:  Positive for numbness.       Tingling  Psychiatric/Behavioral:         Depression  All other systems reviewed and are negative.      Objective:   Physical Exam  Gen: no distress, normal appearing HEENT: oral mucosa pink and moist, NCAT, wearing soft cervical collar Cardio: Reg rate Chest: normal effort, normal rate of breathing Abd: soft, non-distended Ext: no edema Psych: pleasant, normal affect Skin: intact Neuro: alert and oriented x3, follows commands, strength 5/5 in all 4 extremities Alerted sensation in b/l UE to light touch Musculoskeletal: full ROM, no joint swelling or redness noted, no increased tone noted She is sitting in WC   Today's Vitals   07/10/22 1352  BP: (!) 154/76  Pulse: 82  SpO2: 95%  Weight: 49.8 kg  Height: '5\' 1"'$  (1.549 m)   Body mass index is 20.75  kg/m.      Assessment & Plan:   Central cord syndrome with cervical stenosis and TBI (SAH/SDH) -Strength improved but sensation continues to be altered in b/l  UE            -Continue home OT and PT  -Increase gabapentin from 100 in am, 100 mg in afternoon and '200mg'$  HS to 200 TID.  Hopefully this will help with painful neuropathic pain in her arms. Will increase slowly to avoid sedation.   -Continue duloxetine   2. L occipital calvarium fx  -Continue cervical collar until Dr. Annette Stable recommends discontinuation  3. Chronic Diarrhea, short gut syndrome  -Improved, cont imodium prn  4. HTN, she reports BP usually well controlled at home with SBP in 120s or 130s. Advise to monitor home BP and provide log to PCP at next visit.   5. S/p TURBT for invasive high grade bladder CA  -S/p xrt, continue to f/u with urology

## 2022-07-11 ENCOUNTER — Ambulatory Visit
Admission: RE | Admit: 2022-07-11 | Discharge: 2022-07-11 | Disposition: A | Discharge status: Home / Self Care | Payer: Medicare Other | Source: Ambulatory Visit | Attending: Urology | Admitting: Urology

## 2022-07-11 ENCOUNTER — Ambulatory Visit: Payer: Medicare Other | Admitting: Urology

## 2022-07-11 ENCOUNTER — Encounter: Payer: Self-pay | Admitting: Physical Medicine & Rehabilitation

## 2022-07-11 ENCOUNTER — Encounter: Payer: Self-pay | Admitting: Urology

## 2022-07-11 DIAGNOSIS — S14124D Central cord syndrome at C4 level of cervical spinal cord, subsequent encounter: Secondary | ICD-10-CM | POA: Diagnosis not present

## 2022-07-11 DIAGNOSIS — S14125D Central cord syndrome at C5 level of cervical spinal cord, subsequent encounter: Secondary | ICD-10-CM | POA: Diagnosis not present

## 2022-07-11 DIAGNOSIS — S14123D Central cord syndrome at C3 level of cervical spinal cord, subsequent encounter: Secondary | ICD-10-CM | POA: Diagnosis not present

## 2022-07-11 DIAGNOSIS — C679 Malignant neoplasm of bladder, unspecified: Secondary | ICD-10-CM

## 2022-07-11 DIAGNOSIS — M800AXD Age-related osteoporosis with current pathological fracture, other site, subsequent encounter for fracture with routine healing: Secondary | ICD-10-CM | POA: Diagnosis not present

## 2022-07-11 DIAGNOSIS — I129 Hypertensive chronic kidney disease with stage 1 through stage 4 chronic kidney disease, or unspecified chronic kidney disease: Secondary | ICD-10-CM | POA: Diagnosis not present

## 2022-07-11 DIAGNOSIS — S06300D Unspecified focal traumatic brain injury without loss of consciousness, subsequent encounter: Secondary | ICD-10-CM | POA: Diagnosis not present

## 2022-07-11 NOTE — Progress Notes (Signed)
Radiation Oncology         (336) [phone] ________________________________  Name: Jane Andrews MRN: [id number]  Date: 07/11/2022  DOB: [birth year]  Post Treatment Note  CC: Redmond School, MD  Primus Bravo., *  Diagnosis:   88 y/o female with muscle invasive bladder cancer  Interval Since Last Radiation:  5 weeks  05/27/22 - 06/05/22:  The bladder was treated to 28 Gy in 8 fractions of 3.5 Gy each with plans for a 4 week break with potential additional radiation after the break, pending tolerance of treatment, using a Split-course approach.  Narrative:  I spoke with the patient to conduct her routine scheduled 1 month follow up visit via telephone to spare the patient unnecessary potential exposure in the healthcare setting during the current COVID-19 pandemic.  The patient was notified in advance and gave permission to proceed with this visit format.  She tolerated radiation treatment relatively well.   She did report some dysuria, increased frequency and urgency with occasional urge incontinence.  She also experienced some diarrhea but denied abdominal pain, nausea or vomiting.  She has had multiple follow-up visits with her urologist since completing her radiation.  She has continued taking Myrbetriq and Uribel as needed and was treated with a course of Omnicef for UTI (urine C&S 06/24/2022 grew 2 organisms so no sensitivities were reported).  She continued with dysuria, frequency, urgency, hesitancy and UUI despite completing the antibiotics.  At the time of her most recent follow-up visit on 07/04/2022, her PVR was 0 but her microscopic urinalysis still indicated persistent infection so a urine C&S has been sent again but results pending.                        On review of systems, obtained through her Chauncey Reading, [name], the patient states that she is not feeling well today.  She denies any fever, chills or night sweats but has significant fatigue, anorexia and persistent LUTS.  She  denies gross hematuria, abdominal pain, nausea, vomiting or diarrhea.  ALLERGIES:  is allergic to codeine.  Meds: Current Outpatient Medications  Medication Sig Dispense Refill   acetaminophen (TYLENOL) 500 MG tablet Take 1 tablet (500 mg total) by mouth 4 (four) times daily -  with meals and at bedtime.     ALPRAZolam (XANAX) 0.5 MG tablet Take 0.5-1 mg by mouth See admin instructions. Take 1-2 tablets (0.5-1 mg) daily at bedtime, take 1 tablet (0.5 mg) during the day as needed for anxiety     Ascorbic Acid (VITAMIN C PO) Take 1 tablet by mouth every morning.     Cholecalciferol (VITAMIN D3 PO) Take 1 capsule by mouth every morning.     cyanocobalamin (,VITAMIN B-12,) 1000 MCG/ML injection Inject 1,000 mcg into the muscle every 30 (thirty) days.     diclofenac Sodium (VOLTAREN) 1 % GEL Apply 4 g topically 4 (four) times daily. 350 g 0   DULoxetine (CYMBALTA) 30 MG capsule Take 30 mg by mouth every morning. Take with a 60 mg capsule for a total of 90 mg daily     DULoxetine (CYMBALTA) 60 MG capsule Take 60 mg by mouth every morning. Take with a 30 mg capsule for a total of 90 mg daily     gabapentin (NEURONTIN) 100 MG capsule Take 2 capsules (200 mg total) by mouth 3 (three) times daily. 180 capsule 2   HYDROcodone-acetaminophen (NORCO/VICODIN) 5-325 MG tablet Take 1 tablet by mouth every 6 (six)  hours.     levothyroxine (SYNTHROID) 75 MCG tablet Take 1 tablet (75 mcg total) by mouth daily before breakfast. (Patient taking differently: Take 75 mcg by mouth every morning.) 30 tablet 0   loperamide (IMODIUM) 2 MG capsule Take 1 capsule (2 mg total) by mouth 4 (four) times daily as needed for diarrhea or loose stools. 60 capsule 0   Meth-Hyo-M Bl-Na Phos-Ph Sal (URIBEL) 118 MG CAPS Take 1 capsule (118 mg total) by mouth 4 (four) times daily as needed (pain with urination). 20 capsule 5   methocarbamol (ROBAXIN) 750 MG tablet Take 1 tablet (750 mg total) by mouth 4 (four) times daily. 120 tablet 0    metoprolol succinate (TOPROL-XL) 25 MG 24 hr tablet Take 1 tablet (25 mg total) by mouth every morning. 30 tablet 0   mirabegron ER (MYRBETRIQ) 25 MG TB24 tablet Take 1 tablet (25 mg total) by mouth daily. (Patient taking differently: Take 25 mg by mouth every morning.) 28 tablet 0   Multiple Vitamins-Iron (MULTIVITAMINS WITH IRON) TABS tablet Take 1 tablet by mouth every morning.     polyvinyl alcohol (LIQUIFILM TEARS) 1.4 % ophthalmic solution Place 1 drop into both eyes as needed for dry eyes. 15 mL 0   Probiotic Product (PROBIOTIC PO) Take 1 tablet by mouth every morning.     sodium bicarbonate 650 MG tablet Take 650 mg by mouth 2 (two) times daily.     traMADol (ULTRAM) 50 MG tablet Take 0.5-1 tablets (25-50 mg total) by mouth every 6 (six) hours as needed. 20 tablet 0   traZODone (DESYREL) 50 MG tablet Take 0.5 tablets (25 mg total) by mouth at bedtime. 30 tablet 0   Vitamin D, Ergocalciferol, (DRISDOL) 1.25 MG (50000 UNIT) CAPS capsule Take 1 capsule (50,000 Units total) by mouth every 7 (seven) days. 5 capsule 0   No current facility-administered medications for this visit.    Physical Findings:  vitals were not taken for this visit.   /10 Unable to assess due to telephone follow-up visit format.  Lab Findings: Lab Results  Component Value Date   WBC 6.7 06/06/2022   HGB 10.0 (L) 06/06/2022   HCT 31.0 (L) 06/06/2022   MCV 90.6 06/06/2022   PLT 197 06/06/2022     Radiographic Findings: No results found.  Impression/Plan: 38. 88 y/o female with muscle invasive bladder cancer. She continues to recover from the effects of her recent bladder radiation.  They are hoping to have the results of her urine culture back prior to the weekend so that she can be started on the appropriate antibiotic in hopes that she will start to perk back up soon.  At present, she is adamantly not interested in any further radiation so I advised that while we are happy to continue to participate in her  care if clinically indicated, at this point, we will plan to see her back on an as-needed basis.  She will continue in routine follow-up under the care and direction of Dr. Felipa Eth for continued monitoring and management of her bladder cancer.  I would anticipate that she will have a repeat cystoscopy in the urology office once her UTI has resolved, to assess treatment response and continue monitoring her disease. We enjoyed taking care of her and look forward to continuing to follow her progress via correspondence.  We did discuss that should she develop any gross hematuria, this may very likely be an indication for further radiation to help provide hemostasis.  They appear to  have a good understanding of her disease and our recommendations and are comfortable and in agreement with the stated plan.    Nicholos Johns, PA-C

## 2022-07-11 NOTE — Progress Notes (Signed)
Telephone appointment. I spoke w/ patient's POA (niece) Ms. Jane Andrews, and verified her identity. She reports patient is experiencing some dorsalgia 7/10. No other issues reported at this time.  Meaningful use complete.  Reminded patient's POA of her 1:00pm-07/11/22 telephone appointment w/ Ashlyn Bruning PA-C. I left my extension [phone] in case patient needs anything. Ms. Jane Andrews verbalized understanding.  Patient contact [phone]

## 2022-07-11 NOTE — Progress Notes (Signed)
  Radiation Oncology         (336) [phone] ________________________________  Name: Jane Andrews MRN: [id number]  Date: 06/05/2022  DOB: 06/25/1925  End of Treatment Note  Diagnosis:   88 y/o female with muscle invasive bladder cancer     Indication for treatment:  Palliative       Radiation treatment dates:   05/27/22 - 06/05/22  Site/dose:   The bladder was treated to 28 Gy in 8 fractions of 3.5 Gy each with plans for a 4 week break with potential additional radiation after the break, pending tolerance of treatment, using a Split-course approach.  Beams/energy:   A 3D field set-up was employed with 6 MV X-rays  Narrative: The patient tolerated radiation treatment relatively well.   She did report some dysuria, increased frequency and urgency with occasional urge incontinence.  She also experienced some diarrhea but denied abdominal pain, nausea or vomiting.  Plan: The patient has completed radiation treatment. The patient will return to radiation oncology clinic for routine followup in one month. I advised her to call or return sooner if she has any questions or concerns related to her recovery or treatment. ________________________________  Sheral Apley. Tammi Klippel, M.D.

## 2022-07-12 LAB — URINE CULTURE

## 2022-07-13 DIAGNOSIS — I4891 Unspecified atrial fibrillation: Secondary | ICD-10-CM | POA: Diagnosis not present

## 2022-07-13 DIAGNOSIS — D630 Anemia in neoplastic disease: Secondary | ICD-10-CM | POA: Diagnosis not present

## 2022-07-13 DIAGNOSIS — D509 Iron deficiency anemia, unspecified: Secondary | ICD-10-CM | POA: Diagnosis not present

## 2022-07-13 DIAGNOSIS — C679 Malignant neoplasm of bladder, unspecified: Secondary | ICD-10-CM | POA: Diagnosis not present

## 2022-07-13 DIAGNOSIS — M4802 Spinal stenosis, cervical region: Secondary | ICD-10-CM | POA: Diagnosis not present

## 2022-07-13 DIAGNOSIS — M4317 Spondylolisthesis, lumbosacral region: Secondary | ICD-10-CM | POA: Diagnosis not present

## 2022-07-13 DIAGNOSIS — D631 Anemia in chronic kidney disease: Secondary | ICD-10-CM | POA: Diagnosis not present

## 2022-07-13 DIAGNOSIS — G250 Essential tremor: Secondary | ICD-10-CM | POA: Diagnosis not present

## 2022-07-13 DIAGNOSIS — E039 Hypothyroidism, unspecified: Secondary | ICD-10-CM | POA: Diagnosis not present

## 2022-07-13 DIAGNOSIS — S14123D Central cord syndrome at C3 level of cervical spinal cord, subsequent encounter: Secondary | ICD-10-CM | POA: Diagnosis not present

## 2022-07-13 DIAGNOSIS — R197 Diarrhea, unspecified: Secondary | ICD-10-CM | POA: Diagnosis not present

## 2022-07-13 DIAGNOSIS — M47812 Spondylosis without myelopathy or radiculopathy, cervical region: Secondary | ICD-10-CM | POA: Diagnosis not present

## 2022-07-13 DIAGNOSIS — E875 Hyperkalemia: Secondary | ICD-10-CM | POA: Diagnosis not present

## 2022-07-13 DIAGNOSIS — M47816 Spondylosis without myelopathy or radiculopathy, lumbar region: Secondary | ICD-10-CM | POA: Diagnosis not present

## 2022-07-13 DIAGNOSIS — I129 Hypertensive chronic kidney disease with stage 1 through stage 4 chronic kidney disease, or unspecified chronic kidney disease: Secondary | ICD-10-CM | POA: Diagnosis not present

## 2022-07-13 DIAGNOSIS — I350 Nonrheumatic aortic (valve) stenosis: Secondary | ICD-10-CM | POA: Diagnosis not present

## 2022-07-13 DIAGNOSIS — N184 Chronic kidney disease, stage 4 (severe): Secondary | ICD-10-CM | POA: Diagnosis not present

## 2022-07-13 DIAGNOSIS — M4126 Other idiopathic scoliosis, lumbar region: Secondary | ICD-10-CM | POA: Diagnosis not present

## 2022-07-13 DIAGNOSIS — M419 Scoliosis, unspecified: Secondary | ICD-10-CM | POA: Diagnosis not present

## 2022-07-13 DIAGNOSIS — M800AXD Age-related osteoporosis with current pathological fracture, other site, subsequent encounter for fracture with routine healing: Secondary | ICD-10-CM | POA: Diagnosis not present

## 2022-07-13 DIAGNOSIS — E538 Deficiency of other specified B group vitamins: Secondary | ICD-10-CM | POA: Diagnosis not present

## 2022-07-13 DIAGNOSIS — S14124D Central cord syndrome at C4 level of cervical spinal cord, subsequent encounter: Secondary | ICD-10-CM | POA: Diagnosis not present

## 2022-07-13 DIAGNOSIS — S14125D Central cord syndrome at C5 level of cervical spinal cord, subsequent encounter: Secondary | ICD-10-CM | POA: Diagnosis not present

## 2022-07-13 DIAGNOSIS — S06300D Unspecified focal traumatic brain injury without loss of consciousness, subsequent encounter: Secondary | ICD-10-CM | POA: Diagnosis not present

## 2022-07-13 DIAGNOSIS — M4312 Spondylolisthesis, cervical region: Secondary | ICD-10-CM | POA: Diagnosis not present

## 2022-07-14 ENCOUNTER — Telehealth: Payer: Self-pay

## 2022-07-14 NOTE — Telephone Encounter (Signed)
-----   Message from Primus Bravo, MD sent at 07/13/2022 10:35 PM EDT ----- Please let the patient know that her urine culture did not show a specific bacteria. If possible, I would like for her to provide a urine sample for [facility] culture as soon as possible.

## 2022-07-14 NOTE — Telephone Encounter (Signed)
Patient's niece called and made aware. Ms. Jane Andrews will come by and pick up a urine cup and bring specimen back to be sent for MDX culture.

## 2022-07-15 ENCOUNTER — Other Ambulatory Visit: Payer: Medicare Other

## 2022-07-16 ENCOUNTER — Other Ambulatory Visit: Payer: Medicare Other

## 2022-07-16 DIAGNOSIS — S14124D Central cord syndrome at C4 level of cervical spinal cord, subsequent encounter: Secondary | ICD-10-CM | POA: Diagnosis not present

## 2022-07-16 DIAGNOSIS — M800AXD Age-related osteoporosis with current pathological fracture, other site, subsequent encounter for fracture with routine healing: Secondary | ICD-10-CM | POA: Diagnosis not present

## 2022-07-16 DIAGNOSIS — S06300D Unspecified focal traumatic brain injury without loss of consciousness, subsequent encounter: Secondary | ICD-10-CM | POA: Diagnosis not present

## 2022-07-16 DIAGNOSIS — R300 Dysuria: Secondary | ICD-10-CM | POA: Diagnosis not present

## 2022-07-16 DIAGNOSIS — I129 Hypertensive chronic kidney disease with stage 1 through stage 4 chronic kidney disease, or unspecified chronic kidney disease: Secondary | ICD-10-CM | POA: Diagnosis not present

## 2022-07-16 DIAGNOSIS — S14125D Central cord syndrome at C5 level of cervical spinal cord, subsequent encounter: Secondary | ICD-10-CM | POA: Diagnosis not present

## 2022-07-16 DIAGNOSIS — S14123D Central cord syndrome at C3 level of cervical spinal cord, subsequent encounter: Secondary | ICD-10-CM | POA: Diagnosis not present

## 2022-07-20 ENCOUNTER — Other Ambulatory Visit: Payer: Self-pay | Admitting: Urology

## 2022-07-20 MED ORDER — FOSFOMYCIN TROMETHAMINE 3 G PO PACK: 3.0000 g | PACK | Freq: Once | ORAL | 0 refills | Status: AC

## 2022-07-20 MED ORDER — SULFAMETHOXAZOLE-TRIMETHOPRIM 800-160 MG PO TABS: 1.0000 | ORAL_TABLET | Freq: Two times a day (BID) | ORAL | 0 refills | Status: AC

## 2022-07-21 ENCOUNTER — Ambulatory Visit: Payer: Medicare Other | Admitting: Orthopedic Surgery

## 2022-07-21 ENCOUNTER — Emergency Department (HOSPITAL_COMMUNITY): Payer: Medicare Other

## 2022-07-21 ENCOUNTER — Encounter (HOSPITAL_COMMUNITY): Payer: Self-pay

## 2022-07-21 ENCOUNTER — Emergency Department (HOSPITAL_COMMUNITY)
Admission: EM | Admit: 2022-07-21 | Discharge: 2022-07-21 | Disposition: A | Discharge status: Home / Self Care | Payer: Medicare Other | Attending: Emergency Medicine | Admitting: Emergency Medicine

## 2022-07-21 ENCOUNTER — Telehealth: Payer: Self-pay

## 2022-07-21 ENCOUNTER — Other Ambulatory Visit: Payer: Self-pay

## 2022-07-21 DIAGNOSIS — M25532 Pain in left wrist: Secondary | ICD-10-CM

## 2022-07-21 DIAGNOSIS — Z79899 Other long term (current) drug therapy: Secondary | ICD-10-CM | POA: Diagnosis not present

## 2022-07-21 DIAGNOSIS — M19032 Primary osteoarthritis, left wrist: Secondary | ICD-10-CM | POA: Insufficient documentation

## 2022-07-21 DIAGNOSIS — M19039 Primary osteoarthritis, unspecified wrist: Secondary | ICD-10-CM

## 2022-07-21 MED ORDER — COLCHICINE 0.6 MG PO TABS: 0.6000 mg | ORAL_TABLET | Freq: Every day | ORAL | 0 refills | Status: DC

## 2022-07-21 MED ORDER — OXYCODONE-ACETAMINOPHEN 5-325 MG PO TABS
1.0000 | ORAL_TABLET | Freq: Once | ORAL | Status: AC
  Administered 2022-07-21: 1 via ORAL
  Filled 2022-07-21: qty 1

## 2022-07-21 MED ORDER — COLCHICINE 0.6 MG PO TABS
0.6000 mg | ORAL_TABLET | Freq: Once | ORAL | Status: AC
Start: 2022-07-21 — End: 2022-07-21
  Administered 2022-07-21: 0.6 mg via ORAL
  Filled 2022-07-21: qty 1

## 2022-07-21 NOTE — Telephone Encounter (Signed)
-----   Message from Primus Bravo, MD sent at 07/20/2022 10:07 AM EDT ----- Please notify patient that her urine culture showed evidence of a UTI with 3 different bacteria. She will need 2 different antibiotics to completely treat this UTI. I have sent in rx for fosfomycin 3 g x 1 dose and Bactrim DS BID x 7 days.

## 2022-07-21 NOTE — Discharge Instructions (Addendum)
Please use Tylenol or ibuprofen for pain.  You may use 600 mg ibuprofen every 6 hours or 1000 mg of Tylenol every 6 hours.  You may choose to alternate between the 2.  This would be most effective.  Not to exceed 4 g of Tylenol within 24 hours.  Not to exceed 3200 mg ibuprofen 24 hours.  Recommend that you complete the RICE method which includes rest, ice, compression, elevation.  I am prescribing some medication to treat as though this was a possible gout flare due to the severity of the redness, however this medication should help if it is simply inflammation from arthritis as well.  I recommend that you continue using your at home Voltaren gel, you can use the tramadol, gabapentin that you have in addition to the above, as well as the muscle relaxants that you are prescribed as needed.  I recommend that you follow-up with orthopedics for further evaluation and management

## 2022-07-21 NOTE — ED Provider Notes (Signed)
[facility] EMERGENCY DEPARTMENT Provider Note   CSN: [id number] Arrival date & time: 07/21/22  7628     History  Chief Complaint  Patient presents with   Joint Swelling    Jane Andrews is a 88 y.o. female who presents with severe left wrist pain, swelling, hot to the touch for around a week.  Patient with no known history of disease of the left wrist she does use a walker and reports that she thinks she may have injured it by putting pressure on the walker.  She tried 1 hydrocodone last night with minimal relief of pain.  She tried ice as well but cannot tolerate for longer than a few minutes.  Patient denies any numbness, tingling.  She denies history of previous gout. HPI     Home Medications Prior to Admission medications   Medication Sig Start Date End Date Taking? Authorizing Provider  colchicine 0.6 MG tablet Take 1 tablet (0.6 mg total) by mouth daily. Take 1.'2mg'$  orally at the first sign of a flare, followed by 0.'6mg'$  one hour later, take one tablet daily thereafter 07/21/22  Yes Armand Preast H, PA-C  sulfamethoxazole-trimethoprim (BACTRIM DS) 800-160 MG tablet Take 1 tablet by mouth every 12 (twelve) hours for 7 days. 07/20/22 07/27/22  Stoneking, Reece Leader., MD  acetaminophen (TYLENOL) 500 MG tablet Take 1 tablet (500 mg total) by mouth 4 (four) times daily -  with meals and at bedtime. 06/06/22   Love, Ivan Anchors, PA-C  ALPRAZolam Duanne Moron) 0.5 MG tablet Take 0.5-1 mg by mouth See admin instructions. Take 1-2 tablets (0.5-1 mg) daily at bedtime, take 1 tablet (0.5 mg) during the day as needed for anxiety 12/19/21   [provider]  Ascorbic Acid (VITAMIN C PO) Take 1 tablet by mouth every morning.    [provider]  Cholecalciferol (VITAMIN D3 PO) Take 1 capsule by mouth every morning.    [provider]  cyanocobalamin (,VITAMIN B-12,) 1000 MCG/ML injection Inject 1,000 mcg into the muscle every 30 (thirty) days. 07/16/20   [provider]   diclofenac Sodium (VOLTAREN) 1 % GEL Apply 4 g topically 4 (four) times daily. 06/06/22   Love, Ivan Anchors, PA-C  DULoxetine (CYMBALTA) 30 MG capsule Take 30 mg by mouth every morning. Take with a 60 mg capsule for a total of 90 mg daily    [provider]  DULoxetine (CYMBALTA) 60 MG capsule Take 60 mg by mouth every morning. Take with a 30 mg capsule for a total of 90 mg daily 03/30/22   [provider]  gabapentin (NEURONTIN) 100 MG capsule Take 2 capsules (200 mg total) by mouth 3 (three) times daily. 07/10/22   Jennye Boroughs, MD  levothyroxine (SYNTHROID) 75 MCG tablet Take 1 tablet (75 mcg total) by mouth daily before breakfast. Patient taking differently: Take 75 mcg by mouth every morning. 07/09/20   Gerlene Fee, NP  loperamide (IMODIUM) 2 MG capsule Take 1 capsule (2 mg total) by mouth 4 (four) times daily as needed for diarrhea or loose stools. 06/06/22   Love, Ivan Anchors, PA-C  Meth-Hyo-M Bl-Na Phos-Ph Sal (URIBEL) 118 MG CAPS Take 1 capsule (118 mg total) by mouth 4 (four) times daily as needed (pain with urination). 04/23/22   Stoneking, Reece Leader., MD  methocarbamol (ROBAXIN) 750 MG tablet Take 1 tablet (750 mg total) by mouth 4 (four) times daily. 06/06/22   Love, Ivan Anchors, PA-C  metoprolol succinate (TOPROL-XL) 25 MG 24 hr tablet Take  1 tablet (25 mg total) by mouth every morning. 06/06/22   Love, Ivan Anchors, PA-C  mirabegron ER (MYRBETRIQ) 25 MG TB24 tablet Take 1 tablet (25 mg total) by mouth daily. Patient taking differently: Take 25 mg by mouth every morning. 04/23/22   Stoneking, Reece Leader., MD  Multiple Vitamins-Iron (MULTIVITAMINS WITH IRON) TABS tablet Take 1 tablet by mouth every morning. 06/19/20   [provider]  Probiotic Product (PROBIOTIC PO) Take 1 tablet by mouth every morning.    [provider]  sodium bicarbonate 650 MG tablet Take 650 mg by mouth 2 (two) times daily. 09/20/20   [provider]  traMADol (ULTRAM) 50 MG tablet  Take 0.5-1 tablets (25-50 mg total) by mouth every 6 (six) hours as needed. 06/06/22 06/06/23  Love, Ivan Anchors, PA-C  traZODone (DESYREL) 50 MG tablet Take 0.5 tablets (25 mg total) by mouth at bedtime. 06/06/22   Love, Ivan Anchors, PA-C  Vitamin D, Ergocalciferol, (DRISDOL) 1.25 MG (50000 UNIT) CAPS capsule Take 1 capsule (50,000 Units total) by mouth every 7 (seven) days. 06/08/22   Love, Ivan Anchors, PA-C      Allergies    Codeine    Review of Systems   Review of Systems  Musculoskeletal:  Positive for arthralgias and joint swelling.  All other systems reviewed and are negative.   Physical Exam Updated Vital Signs BP 128/87 (BP Location: Right Arm)   Pulse 90   Temp 98.4 F (36.9 C) (Oral)   Resp 16   Ht '5\' 1"'$  (1.549 m)   Wt 49 kg   SpO2 94%   BMI 20.41 kg/m  Physical Exam Vitals and nursing note reviewed.  Constitutional:      General: She is not in acute distress.    Appearance: Normal appearance.  HENT:     Head: Normocephalic and atraumatic.  Eyes:     General:        Right eye: No discharge.        Left eye: No discharge.  Cardiovascular:     Rate and Rhythm: Normal rate and regular rhythm.  Pulmonary:     Effort: Pulmonary effort is normal. No respiratory distress.  Musculoskeletal:        General: Swelling present. No deformity.  Skin:    General: Skin is warm and dry.  Neurological:     Mental Status: She is alert and oriented to person, place, and time.  Psychiatric:        Mood and Affect: Mood normal.        Behavior: Behavior normal.     ED Results / Procedures / Treatments   Labs (all labs ordered are listed, but only abnormal results are displayed) Labs Reviewed - No data to display  EKG None  Radiology DG Wrist Complete Left  Result Date: 07/21/2022 CLINICAL DATA:  Left wrist pain and swelling.  No known injury. EXAM: LEFT WRIST - COMPLETE 3+ VIEW COMPARISON:  None Available. FINDINGS: There is no evidence of acute fracture or dislocation. Severe  osteoarthritis is seen involving the 1st carpal-metacarpal joint. Chronic collapse of the trapezium is seen, with mild osteoarthritis involving scaphoid-trapezium joint space. Soft tissues are unremarkable. IMPRESSION: No acute findings. 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 Electronically Signed   By: Marlaine Hind M.D.   On: 07/21/2022 10:38    Procedures Procedures    Medications Ordered in ED Medications  oxyCODONE-acetaminophen (PERCOCET/ROXICET) 5-325 MG per tablet 1 tablet (has no administration in  time range)  colchicine tablet 0.6 mg (has no administration in time range)    ED Course/ Medical Decision Making/ A&P                           Medical Decision Making Amount and/or Complexity of Data Reviewed Radiology: ordered.  Risk Prescription drug management.   This is an overall well-appearing 88 year old female who presents with concern for acute Onset left wrist pain over the last 1 week with swelling, redness.  My emergent differential diagnosis includes arthritis, gout flare, less clinical concern based on afebrile presentation and chronicity of onset without  Physical exam findings seem consistent with arthritis pain plus or minus gout pain.  Patient without history of gout at 88 years old so I think this is less likely. I independently interpreted imaging including plain film imaging of the left wrist which shows severe arthritis with some collapse of trapezius. I agree with the radiologist interpretation.  Low clinical concern for septic arthritis with no fever , And intact joint range of motion.  She is neurovascularly intact throughout.  Discussed with patient that her findings are consistent with arthritis versus early gout flare.  We will treat with pain control, colchicine, encouraged Tylenol, topical anti-inflammatories, she can use her at home muscle relaxant, tramadol for breakthrough pain.  Encourage follow-up with her orthopedist.   We will provide wrist brace and encouraged rest, ice, compression, elevation of the affected extremity.  Patient discharged in stable condition at this time. Return precautions given.  Final Clinical Impression(s) / ED Diagnoses Final diagnoses:  Wrist arthritis  Left wrist pain    Rx / DC Orders ED Discharge Orders          Ordered    colchicine 0.6 MG tablet  Daily        07/21/22 1200              Elaf Clauson, Athol H, PA-C 07/21/22 1209    Wynona Dove A, DO 07/28/22 [phone]

## 2022-07-21 NOTE — Telephone Encounter (Signed)
Patient called with no answer. Detailed message left with call back requested to confirm message was received.

## 2022-07-21 NOTE — ED Triage Notes (Signed)
Patient with complaints of left wrist pain and swelling and is hot to the touch for a week.

## 2022-07-23 DIAGNOSIS — M800AXD Age-related osteoporosis with current pathological fracture, other site, subsequent encounter for fracture with routine healing: Secondary | ICD-10-CM | POA: Diagnosis not present

## 2022-07-23 DIAGNOSIS — S14125D Central cord syndrome at C5 level of cervical spinal cord, subsequent encounter: Secondary | ICD-10-CM | POA: Diagnosis not present

## 2022-07-23 DIAGNOSIS — I129 Hypertensive chronic kidney disease with stage 1 through stage 4 chronic kidney disease, or unspecified chronic kidney disease: Secondary | ICD-10-CM | POA: Diagnosis not present

## 2022-07-23 DIAGNOSIS — S14124D Central cord syndrome at C4 level of cervical spinal cord, subsequent encounter: Secondary | ICD-10-CM | POA: Diagnosis not present

## 2022-07-23 DIAGNOSIS — S14123D Central cord syndrome at C3 level of cervical spinal cord, subsequent encounter: Secondary | ICD-10-CM | POA: Diagnosis not present

## 2022-07-23 DIAGNOSIS — S06300D Unspecified focal traumatic brain injury without loss of consciousness, subsequent encounter: Secondary | ICD-10-CM | POA: Diagnosis not present

## 2022-07-23 NOTE — Telephone Encounter (Signed)
Spoke with patients caregiver to confirm that patient received message and is taking the antibiotics sent in. Caregiver states patient put up both medications and is currently taking them.

## 2022-07-24 DIAGNOSIS — R200 Anesthesia of skin: Secondary | ICD-10-CM | POA: Diagnosis not present

## 2022-07-24 DIAGNOSIS — M4312 Spondylolisthesis, cervical region: Secondary | ICD-10-CM | POA: Diagnosis not present

## 2022-07-25 ENCOUNTER — Ambulatory Visit (INDEPENDENT_AMBULATORY_CARE_PROVIDER_SITE_OTHER): Payer: Medicare Other

## 2022-07-25 ENCOUNTER — Ambulatory Visit (INDEPENDENT_AMBULATORY_CARE_PROVIDER_SITE_OTHER): Payer: Medicare Other | Admitting: Orthopedic Surgery

## 2022-07-25 ENCOUNTER — Encounter: Payer: Self-pay | Admitting: Orthopedic Surgery

## 2022-07-25 ENCOUNTER — Other Ambulatory Visit: Payer: Self-pay | Admitting: Orthopedic Surgery

## 2022-07-25 VITALS — BP 128/82 | HR 99

## 2022-07-25 DIAGNOSIS — M19032 Primary osteoarthritis, left wrist: Secondary | ICD-10-CM

## 2022-07-25 DIAGNOSIS — M25561 Pain in right knee: Secondary | ICD-10-CM

## 2022-07-25 DIAGNOSIS — G8929 Other chronic pain: Secondary | ICD-10-CM

## 2022-07-25 NOTE — Progress Notes (Signed)
Chief Complaint  Patient presents with   Knee Pain    RT knee/ has been hurting a while but today pain is much better. She is able to stand up and get her walker now however when she moves she feels pain in the front of the knee   Wrist Pain    LT wrist/ told to follow up with ortho. Seen in ED 06/20/22   Left wrist ER visit on the seventh notes reviewed apparently patient had an acute arthritic reaction and synovitis of the left wrist  Right knee x-rays were normal today  She came in for right knee pain she felt some sliding around the kneecap some grinding behind the kneecap but today she says her knee feels good  Her exam is benign she has full range of motion no swelling no effusion may be some crepitation on range of motion.  Good strength on extension  Left wrist no swelling range of motion is normal without pain  Wrist x-ray shows arthritis nothing too significant but there was soft tissue swelling on the outside film which I reviewed and that is my personal interpretation  As far as the knee goes no intervention at this time  Follow-up as needed  Problem #1 acute wrist swelling stable  Problem #2 acute right knee pain stable  1 outside image was interpreted  Low risk of morbidity mortality  Level 3 visit

## 2022-07-28 DIAGNOSIS — S14124D Central cord syndrome at C4 level of cervical spinal cord, subsequent encounter: Secondary | ICD-10-CM | POA: Diagnosis not present

## 2022-07-28 DIAGNOSIS — S14123D Central cord syndrome at C3 level of cervical spinal cord, subsequent encounter: Secondary | ICD-10-CM | POA: Diagnosis not present

## 2022-07-28 DIAGNOSIS — S06300D Unspecified focal traumatic brain injury without loss of consciousness, subsequent encounter: Secondary | ICD-10-CM | POA: Diagnosis not present

## 2022-07-28 DIAGNOSIS — I129 Hypertensive chronic kidney disease with stage 1 through stage 4 chronic kidney disease, or unspecified chronic kidney disease: Secondary | ICD-10-CM | POA: Diagnosis not present

## 2022-07-28 DIAGNOSIS — S14125D Central cord syndrome at C5 level of cervical spinal cord, subsequent encounter: Secondary | ICD-10-CM | POA: Diagnosis not present

## 2022-07-28 DIAGNOSIS — M800AXD Age-related osteoporosis with current pathological fracture, other site, subsequent encounter for fracture with routine healing: Secondary | ICD-10-CM | POA: Diagnosis not present

## 2022-07-30 ENCOUNTER — Other Ambulatory Visit: Payer: Self-pay | Admitting: Urology

## 2022-07-31 DIAGNOSIS — S14123D Central cord syndrome at C3 level of cervical spinal cord, subsequent encounter: Secondary | ICD-10-CM | POA: Diagnosis not present

## 2022-07-31 DIAGNOSIS — S06300D Unspecified focal traumatic brain injury without loss of consciousness, subsequent encounter: Secondary | ICD-10-CM | POA: Diagnosis not present

## 2022-07-31 DIAGNOSIS — S14124D Central cord syndrome at C4 level of cervical spinal cord, subsequent encounter: Secondary | ICD-10-CM | POA: Diagnosis not present

## 2022-07-31 DIAGNOSIS — M800AXD Age-related osteoporosis with current pathological fracture, other site, subsequent encounter for fracture with routine healing: Secondary | ICD-10-CM | POA: Diagnosis not present

## 2022-07-31 DIAGNOSIS — S14125D Central cord syndrome at C5 level of cervical spinal cord, subsequent encounter: Secondary | ICD-10-CM | POA: Diagnosis not present

## 2022-07-31 DIAGNOSIS — I129 Hypertensive chronic kidney disease with stage 1 through stage 4 chronic kidney disease, or unspecified chronic kidney disease: Secondary | ICD-10-CM | POA: Diagnosis not present

## 2022-08-05 ENCOUNTER — Encounter: Payer: Self-pay | Admitting: Urology

## 2022-08-05 ENCOUNTER — Ambulatory Visit (INDEPENDENT_AMBULATORY_CARE_PROVIDER_SITE_OTHER): Payer: Medicare Other | Admitting: Urology

## 2022-08-05 VITALS — BP 122/71 | HR 96 | Ht 61.0 in | Wt 108.0 lb

## 2022-08-05 DIAGNOSIS — C670 Malignant neoplasm of trigone of bladder: Secondary | ICD-10-CM

## 2022-08-05 DIAGNOSIS — Z87440 Personal history of urinary (tract) infections: Secondary | ICD-10-CM

## 2022-08-05 LAB — MICROSCOPIC EXAMINATION
Renal Epithel, UA: NONE SEEN /hpf
WBC, UA: >30 /hpf — AB (ref 0–5)

## 2022-08-05 LAB — URINALYSIS, ROUTINE W REFLEX MICROSCOPIC
Bilirubin, UA: NEGATIVE
Glucose, UA: NEGATIVE
Nitrite, UA: NEGATIVE
Specific Gravity, UA: 1.015 (ref 1.005–1.030)
Urobilinogen, Ur: 0.2 mg/dL (ref 0.2–1.0)
pH, UA: 5 (ref 5.0–7.5)

## 2022-08-05 MED ORDER — FOSFOMYCIN TROMETHAMINE 3 G PO PACK
PACK | ORAL | 3 refills | Status: DC
Start: 2022-08-05 — End: 2022-09-09

## 2022-08-05 MED ORDER — LOPERAMIDE HCL 2 MG PO CAPS: 2.0000 mg | ORAL_CAPSULE | Freq: Four times a day (QID) | ORAL | 0 refills | Status: DC | PRN

## 2022-08-05 MED ORDER — MIRABEGRON ER 50 MG PO TB24: 50.0000 mg | ORAL_TABLET | Freq: Every day | ORAL | 5 refills | Status: DC

## 2022-08-05 NOTE — Progress Notes (Signed)
Assessment: 1. 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 History of UTI     Plan: Continue Uribel prn Continue Myrbetriq 50 mg daily.  Samples and rx given. Begin fosfomycin 3 g p.o. every 10 days for UTI prevention. Return to office in 1 month for cystoscopy.  Chief Complaint: Chief Complaint  Patient presents with   Bladder Cancer    HPI: Jane Andrews is a 88 y.o. female who presents for continued evaluation of dysuria.  She was initially seen in January 2023 for dysuria.  Her symptoms have been present for approximately 2 months.   Urine culture from 12/21/2021 grew 50-100 K mixed flora.   MDX culture from 01/07/2022 showed no organisms. Urine culture from 03/29/2022 grew 10-20 5K mixed flora. No gross hematuria.  She does have urgency, frequency. She continued to have intermittent dysuria, frequency, and incontinence.  No gross hematuria or flank pain. CT renal stone study from 01/26/2022 showed a stable staghorn calculus in the right kidney, tiny left lower pole renal calculi, and a cystic area in the right pelvis/adnexa. Cystoscopy from 4/23 demonstrated a sessile appearing bladder mass in the trigone area. Urine cytology showed dysplastic cells and was FISH positive. She underwent cystoscopy with transurethral resection of the bladder tumor on 04/16/2022.  The UOs were unable to be identified during the procedure.  Pathology showed high-grade urothelial carcinoma with squamous cell component (95%) with invasion of the muscularis propria.  She was discharged from the hospital on postoperative day #2.  Her creatinine remained fairly stable at 2.1 at the time of discharge.  At her visit on 04/23/2022, she was not having any gross hematuria at the present time.  She was voiding frequently and continued to have dysuria and bladder spasms.  No flank pain or abdominal pain.  No nausea  or vomiting.  She had completed her antibiotics. Creatinine from 04/23/2022 stable at 1.87. PET scan from 05/02/2022 showed irregular contour of the bladder consistent with the known bladder cancer, no evidence of metastatic disease, no evidence of ureteral obstruction. She underwent further management with palliative radiation therapy, completing a total of 8 treatments on 06/05/22.   She fell at home and sustained a head injury with intracranial hemorrhage.  She was admitted to the hospital and subsequently to inpatient rehab.  She is now back at home. She has been voiding spontaneously.  She reported some improvement in her dysuria. She continued to use Uribel prn and Myrbetriq 25 mg daily. She was evaluated for increased dysuria.  A urinalysis from 06/24/2022 showed >30 WBCs, >30 RBCs, and moderate bacteria.  Urine culture grew 50-100 K of >2 organisms.  She was treated with cefdinir. She continued to have urinary symptoms as well as an abnormal urinalysis. Resolved MDX urine culture from 07/16/2022 grew Enterococcus, E. coli, and Klebsiella.  She was treated with a combination of fosfomycin and Bactrim.  She returns today for follow-up.  She has completed the fosfomycin and Bactrim.  She has noted improvement in her bladder symptoms following the antibiotics.  She is not having significant dysuria at this time.  No gross hematuria or flank pain.  She continues with frequency, urgency, and incontinence.  She did notice improvement in her symptoms with the higher dose of Myrbetriq.  Portions of the above documentation were copied from a prior visit for review purposes only.  Allergies: Allergies  Allergen Reactions   Codeine Nausea  And Vomiting    PMH: Past Medical History:  Diagnosis Date   Abdominal adhesions 1994   Allergic rhinitis    Anemia    Anxiety and depression    Aortic stenosis    Arthritis    Atrial fibrillation ([place]) 10/2012   Associated with severe anemia and esophageal  pill impaction   Breast carcinoma (HCC)    Right mastectomy   Cholelithiasis    Essential hypertension    Gastroesophageal reflux disease    Hiatal hernia   History of blood transfusion    Hypothyroidism    Low back pain    Malabsorption    Short gut syndrome following small bowel resection surgery x2   Nephrolithiasis 2004   Painless hematuria   Short gut syndrome    Bowel resection , 2004   Upper GI bleed 2004   Multiple episodes of melena-? due to gastritis or adverse drug effect (nonsteroidals, small bowel ulceration with Fosamax); caused by Pepto-Bismol during one Emergency Department evaluation    PSH: Past Surgical History:  Procedure Laterality Date   ABDOMINAL HYSTERECTOMY     emergency s/p delivery   ABDOMINAL HYSTERECTOMY  1960   massive gynecologic bleeding   BOWEL RESECTION     Resulting short gut syndrome   CHOLECYSTECTOMY N/A 06/25/2018   Procedure: LAPAROSCOPIC CHOLECYSTECTOMY WITH INTRAOPERATIVE CHOLANGIOGRAM;  Surgeon: Armandina Gemma, MD;  Location: [facility];  Service: General;  Laterality: N/A;   COLONOSCOPY W/ POLYPECTOMY  2005   Lipoma; diverticulosis   COLONOSCOPY WITH ESOPHAGOGASTRODUODENOSCOPY (EGD)  11/22/2012   [facility]   CYSTOSCOPY W/ RETROGRADES Bilateral 04/16/2022   Procedure: CYSTOSCOPY;  Surgeon: Primus Bravo., MD;  Location: [facility];  Service: Urology;  Laterality: Bilateral;   ESOPHAGOGASTRODUODENOSCOPY (EGD) WITH PROPOFOL N/A 04/04/2022   Procedure: ESOPHAGOGASTRODUODENOSCOPY (EGD) WITH PROPOFOL;  Surgeon: Rogene Houston, MD;  Location: [facility];  Service: Endoscopy;  Laterality: N/A;  210   HIP ARTHROPLASTY Right 06/15/2020   Procedure: ANTERIOR ARTHROPLASTY BIPOLAR HIP (HEMIARTHROPLASTY);  Surgeon: Mcarthur Rossetti, MD;  Location: [facility];  Service: Orthopedics;  Laterality: Right;   LAPAROSCOPIC LYSIS OF ADHESIONS  1965   s/p adhesions   MASTECTOMY  right breast   MASTECTOMY     Carcinoma of the breast; right   TRANSURETHRAL  RESECTION OF BLADDER TUMOR N/A 04/16/2022   Procedure: TRANSURETHRAL RESECTION OF BLADDER TUMOR (TURBT);  Surgeon: Primus Bravo., MD;  Location: [facility];  Service: Urology;  Laterality: N/A;   UPPER GASTROINTESTINAL ENDOSCOPY      SH: Social History   Tobacco Use   Smoking status: Former    Packs/day: 1.50    Years: 20.00    Total pack years: 30.00    Types: Cigarettes   Smokeless tobacco: Never  Vaping Use   Vaping Use: Never used  Substance Use Topics   Alcohol use: No   Drug use: No    ROS: Constitutional:  Negative for fever, chills, weight loss CV: Negative for chest pain, previous MI, hypertension Respiratory:  Negative for shortness of breath, wheezing, sleep apnea, frequent cough GI:  Negative for nausea, vomiting, bloody stool, GERD  PE: BP 122/71   Pulse 96   Ht '5\' 1"'$  (1.549 m)   Wt 108 lb (49 kg)   BMI 20.41 kg/m  GENERAL APPEARANCE:  Well appearing, well developed, well nourished, NAD HEENT:  Atraumatic, normocephalic, oropharynx clear NECK:  Supple without lymphadenopathy or thyromegaly ABDOMEN:  Soft, non-tender, no masses EXTREMITIES:  Moves all extremities well, without  clubbing, cyanosis, or edema NEUROLOGIC:  Alert and oriented x 3, CN II-XII grossly intact MENTAL STATUS:  appropriate BACK:  Non-tender to palpation, No CVAT SKIN:  Warm, dry, and intact   Results: U/A: >30 WBCs, 3-10 RBCs, moderate bacteria, nitrite negative

## 2022-08-13 ENCOUNTER — Ambulatory Visit: Payer: Medicare Other | Attending: Cardiology | Admitting: Cardiology

## 2022-08-13 ENCOUNTER — Encounter: Payer: Self-pay | Admitting: Cardiology

## 2022-08-13 VITALS — BP 112/64 | HR 75 | Ht 61.0 in | Wt 110.6 lb

## 2022-08-13 DIAGNOSIS — N1832 Chronic kidney disease, stage 3b: Secondary | ICD-10-CM | POA: Insufficient documentation

## 2022-08-13 DIAGNOSIS — I480 Paroxysmal atrial fibrillation: Secondary | ICD-10-CM | POA: Insufficient documentation

## 2022-08-13 DIAGNOSIS — I350 Nonrheumatic aortic (valve) stenosis: Secondary | ICD-10-CM | POA: Diagnosis not present

## 2022-08-13 NOTE — Progress Notes (Signed)
Cardiology Office Note  Date: 08/13/2022   ID: Jane Andrews, DOB 01/22/1925, MRN [id number]  PCP:  Redmond School, MD  Cardiologist:  Rozann Lesches, MD Electrophysiologist:  None   Chief Complaint  Patient presents with   Cardiac follow-up    History of Present Illness: Jane Andrews is a 88 y.o. female last seen in November 2021 by Ms. Strader PA-C.  She is here today with her niece for a routine visit.  I did review her interval records.  She was hospitalized with a fall back in June, also has interval diagnosis of bladder cancer status post radiation treatments.  Still living in her own home with 24-hour care, ambulates with a walker and assistance.  She does not report any palpitations.  I reviewed her medications which are noted below.  She is on Toprol-XL, not anticoagulated long-term given excessive bleeding risk.  I did review her ECG from late May showing sinus rhythm with prolonged PR interval.  Resting heart rate is in the 70s on current dose of Toprol-XL.  Past Medical History:  Diagnosis Date   Abdominal adhesions 1994   Allergic rhinitis    Anemia    Anxiety and depression    Aortic stenosis    Arthritis    Atrial fibrillation ([place]) 10/2012   Associated with severe anemia and esophageal pill impaction   Breast carcinoma (HCC)    Right mastectomy   Cholelithiasis    Essential hypertension    Gastroesophageal reflux disease    Hiatal hernia   History of blood transfusion    Hypothyroidism    Low back pain    Malabsorption    Short gut syndrome following small bowel resection surgery x2   Nephrolithiasis 2004   Painless hematuria   Short gut syndrome    Bowel resection , 2004   Upper GI bleed 2004   Multiple episodes of melena-? due to gastritis or adverse drug effect (nonsteroidals, small bowel ulceration with Fosamax); caused by Pepto-Bismol during one Emergency Department evaluation    Past Surgical History:  Procedure Laterality  Date   ABDOMINAL HYSTERECTOMY     emergency s/p delivery   ABDOMINAL HYSTERECTOMY  1960   massive gynecologic bleeding   BOWEL RESECTION     Resulting short gut syndrome   CHOLECYSTECTOMY N/A 06/25/2018   Procedure: LAPAROSCOPIC CHOLECYSTECTOMY WITH INTRAOPERATIVE CHOLANGIOGRAM;  Surgeon: Armandina Gemma, MD;  Location: [facility];  Service: General;  Laterality: N/A;   COLONOSCOPY W/ POLYPECTOMY  2005   Lipoma; diverticulosis   COLONOSCOPY WITH ESOPHAGOGASTRODUODENOSCOPY (EGD)  11/22/2012   [facility]   CYSTOSCOPY W/ RETROGRADES Bilateral 04/16/2022   Procedure: CYSTOSCOPY;  Surgeon: Primus Bravo., MD;  Location: [facility];  Service: Urology;  Laterality: Bilateral;   ESOPHAGOGASTRODUODENOSCOPY (EGD) WITH PROPOFOL N/A 04/04/2022   Procedure: ESOPHAGOGASTRODUODENOSCOPY (EGD) WITH PROPOFOL;  Surgeon: Rogene Houston, MD;  [facility];  Service: Endoscopy;  Laterality: N/A;  210   HIP ARTHROPLASTY Right 06/15/2020   Procedure: ANTERIOR ARTHROPLASTY BIPOLAR HIP (HEMIARTHROPLASTY);  Surgeon: Mcarthur Rossetti, MD;  Location: [facility];  Service: Orthopedics;  Laterality: Right;   LAPAROSCOPIC LYSIS OF ADHESIONS  1965   s/p adhesions   MASTECTOMY  right breast   MASTECTOMY     Carcinoma of the breast; right   TRANSURETHRAL RESECTION OF BLADDER TUMOR N/A 04/16/2022   Procedure: TRANSURETHRAL RESECTION OF BLADDER TUMOR (TURBT);  Surgeon: Primus Bravo., MD;  Location: [facility];  Service: Urology;  Laterality: N/A;   UPPER  GASTROINTESTINAL ENDOSCOPY      Current Outpatient Medications  Medication Sig Dispense Refill   acetaminophen (TYLENOL) 500 MG tablet Take 1 tablet (500 mg total) by mouth 4 (four) times daily -  with meals and at bedtime.     ALPRAZolam (XANAX) 0.5 MG tablet Take 0.5-1 mg by mouth See admin instructions. Take 1-2 tablets (0.5-1 mg) daily at bedtime, take 1 tablet (0.5 mg) during the day as needed for anxiety     Ascorbic Acid (VITAMIN C PO) Take 1 tablet by mouth  every morning.     Cholecalciferol (VITAMIN D3 PO) Take 1 capsule by mouth every morning.     colchicine 0.6 MG tablet Take 1 tablet (0.6 mg total) by mouth daily. Take 1.'2mg'$  orally at the first sign of a flare, followed by 0.'6mg'$  one hour later, take one tablet daily thereafter 30 tablet 0   cyanocobalamin (,VITAMIN B-12,) 1000 MCG/ML injection Inject 1,000 mcg into the muscle every 30 (thirty) days.     diclofenac Sodium (VOLTAREN) 1 % GEL Apply 4 g topically 4 (four) times daily. 350 g 0   DULoxetine (CYMBALTA) 30 MG capsule Take 30 mg by mouth every morning. Take with a 60 mg capsule for a total of 90 mg daily     DULoxetine (CYMBALTA) 60 MG capsule Take 60 mg by mouth every morning. Take with a 30 mg capsule for a total of 90 mg daily     fosfomycin (MONUROL) 3 g PACK 3 g by mouth every 10 days 9 g 3   gabapentin (NEURONTIN) 100 MG capsule Take 2 capsules (200 mg total) by mouth 3 (three) times daily. 180 capsule 2   levothyroxine (SYNTHROID) 75 MCG tablet Take 1 tablet (75 mcg total) by mouth daily before breakfast. (Patient taking differently: Take 75 mcg by mouth every morning.) 30 tablet 0   loperamide (IMODIUM) 2 MG capsule Take 1 capsule (2 mg total) by mouth 4 (four) times daily as needed for diarrhea or loose stools. 60 capsule 0   Meth-Hyo-M Bl-Na Phos-Ph Sal (URIBEL) 118 MG CAPS Take 1 capsule (118 mg total) by mouth 4 (four) times daily as needed (pain with urination). 20 capsule 5   methocarbamol (ROBAXIN) 750 MG tablet Take 1 tablet (750 mg total) by mouth 4 (four) times daily. 120 tablet 0   metoprolol succinate (TOPROL-XL) 25 MG 24 hr tablet Take 1 tablet (25 mg total) by mouth every morning. 30 tablet 0   mirabegron ER (MYRBETRIQ) 50 MG TB24 tablet Take 1 tablet (50 mg total) by mouth daily. 30 tablet 5   Multiple Vitamins-Iron (MULTIVITAMINS WITH IRON) TABS tablet Take 1 tablet by mouth every morning.     sodium bicarbonate 650 MG tablet Take 650 mg by mouth 2 (two) times daily.      traMADol (ULTRAM) 50 MG tablet Take 0.5-1 tablets (25-50 mg total) by mouth every 6 (six) hours as needed. 20 tablet 0   No current facility-administered medications for this visit.   Allergies:  Codeine   ROS: No orthopnea or PND.  No leg swelling.  Physical Exam: VS:  BP 112/64   Pulse 75   Ht '5\' 1"'$  (1.549 m)   Wt 110 lb 9.6 oz (50.2 kg)   SpO2 96%   BMI 20.90 kg/m , BMI Body mass index is 20.9 kg/m.  Wt Readings from Last 3 Encounters:  08/13/22 110 lb 9.6 oz (50.2 kg)  08/05/22 108 lb (49 kg)  07/21/22 108 lb (49 kg)  General: Patient appears comfortable at rest. HEENT: Conjunctiva and lids normal. Neck: Supple, no elevated JVP or carotid bruits, no thyromegaly. Lungs: Clear to auscultation, nonlabored breathing at rest. Cardiac: Regular rate and rhythm, no S3, 2/6 systolic murmur. Extremities: No pitting edema.  ECG:  An ECG dated 05/13/2022 was personally reviewed today and demonstrated:  Sinus rhythm with prolonged PR interval.  Recent Labwork: 05/13/2022: Magnesium 1.9; TSH 2.788 06/06/2022: ALT 11; AST 15; BUN 35; Creatinine, Ser 1.59; Hemoglobin 10.0; Platelets 197; Potassium 4.2; Sodium 138   Other Studies Reviewed Today:  Echocardiogram 08/28/2020:  1. Left ventricular ejection fraction, by estimation, is 60 to 65%. The  left ventricle has normal function. There is moderate left ventricular  hypertrophy of the basal-septal segment.   2. Right ventricular systolic function is normal. The right ventricular  size is normal.   3. The aortic valve is tricuspid. There is moderate calcification of the  aortic valve. There is moderate thickening of the aortic valve. Aortic  valve regurgitation is not visualized. Mild to moderate aortic valve  stenosis. Aortic valve mean gradient  measures 11.7 mmHg. Aortic valve peak gradient measures 21.6 mmHg. Aortic  valve area, by VTI measures 1.36 cm.   4. The inferior vena cava is normal in size with greater than 50%   respiratory variability, suggesting right atrial pressure of 3 mmHg.   5. Limited echo to evaluate LV function and aortic stenosis   Cardiac monitor September 2021: [employer] monitor reviewed.  30 days analyzed.  Predominant rhythm is sinus with heart rate ranging from 54 bpm up to 127 bpm and average heart rate 72 bpm.  Episode of syncope reported on October 9 correlates with what appears to be lead loss/artifact rather than ventricular fibrillation, particularly since there was presumably spontaneous improvement based on subsequent recordings showing sinus rhythm.  There is no onset or offset documented for review.  Assessment and Plan:  1.  Paroxysmal atrial fibrillation with CHA2DS2-VASc score of 5.  She is not anticoagulated with previous history of recurrent GI bleed, also status post fall and interval diagnosis of bladder cancer as discussed above.  She does not report any palpitations and has regular heart rate today in the 70s on Toprol-XL.  I reviewed her ECG from May.  Continue with observation.  2.  Mild to moderate aortic stenosis by echocardiogram in September 2021.  Continue to follow conservatively at this point.  3.  CKD stage IIIb, creatinine 1.59.  Medication Adjustments/Labs and Tests Ordered: Current medicines are reviewed at length with the patient today.  Concerns regarding medicines are outlined above.   Tests Ordered: No orders of the defined types were placed in this encounter.   Medication Changes: No orders of the defined types were placed in this encounter.   Disposition:  Follow up  6 months.  Signed, Satira Sark, MD, [facility] 08/13/2022 4:31 PM    [facility] at [facility] [address], [place], [place] [address] Phone: [phone]; Fax: [phone]

## 2022-08-13 NOTE — Patient Instructions (Signed)
Medication Instructions:  Your physician recommends that you continue on your current medications as directed. Please refer to the Current Medication list given to you today.   Labwork: None today  Testing/Procedures: None today  Follow-Up: 6 months  Any Other Special Instructions Will Be Listed Below (If Applicable).  If you need a refill on your cardiac medications before your next appointment, please call your pharmacy.  

## 2022-09-01 ENCOUNTER — Other Ambulatory Visit: Payer: Self-pay | Admitting: Orthopedic Surgery

## 2022-09-02 NOTE — Telephone Encounter (Signed)
This medication was not rx d by me Quentin Mulling is the rx er

## 2022-09-09 ENCOUNTER — Encounter: Payer: Self-pay | Admitting: Urology

## 2022-09-09 ENCOUNTER — Ambulatory Visit (INDEPENDENT_AMBULATORY_CARE_PROVIDER_SITE_OTHER): Payer: Medicare Other | Admitting: Urology

## 2022-09-09 VITALS — BP 135/72 | HR 74

## 2022-09-09 DIAGNOSIS — Z87440 Personal history of urinary (tract) infections: Secondary | ICD-10-CM | POA: Diagnosis not present

## 2022-09-09 DIAGNOSIS — C670 Malignant neoplasm of trigone of bladder: Secondary | ICD-10-CM | POA: Diagnosis not present

## 2022-09-09 LAB — BLADDER SCAN AMB NON-IMAGING: Scan Result: 22

## 2022-09-09 MED ORDER — FOSFOMYCIN TROMETHAMINE 3 G PO PACK: PACK | ORAL | 3 refills | Status: DC

## 2022-09-09 NOTE — Progress Notes (Signed)
Assessment: 1. 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 History of UTI      Plan: Continue Uribel prn Continue Myrbetriq 50 mg daily.   Continue fosfomycin 3 g p.o. every 10 days for UTI prevention. CT abdomen and pelvis without contrast Will hold off on cystoscopy today as she is doing well symptom wise. We will contact her with results of the CT scan  Return to office in 2 months.  Chief Complaint: Chief Complaint  Patient presents with   Bladder Cancer    HPI: Jane Andrews is a 88 y.o. female who presents for continued evaluation of dysuria.  She was initially seen in January 2023 for dysuria.  Her symptoms had been present for approximately 2 months.   Urine culture from 12/21/2021 grew 50-100 K mixed flora.   MDX culture from 01/07/2022 showed no organisms. Urine culture from 03/29/2022 grew 10-20 5K mixed flora. No gross hematuria.  She does have urgency, frequency. She continued to have intermittent dysuria, frequency, and incontinence.  No gross hematuria or flank pain. CT renal stone study from 01/26/2022 showed a stable staghorn calculus in the right kidney, tiny left lower pole renal calculi, and a cystic area in the right pelvis/adnexa. Cystoscopy from 4/23 demonstrated a sessile appearing bladder mass in the trigone area. Urine cytology showed dysplastic cells and was FISH positive. She underwent cystoscopy with transurethral resection of the bladder tumor on 04/16/2022.  The UOs were unable to be identified during the procedure.  Pathology showed high-grade urothelial carcinoma with squamous cell component (95%) with invasion of the muscularis propria.  She was discharged from the hospital on postoperative day #2.  Her creatinine remained fairly stable at 2.1 at the time of discharge.  At her visit on 04/23/2022, she was not having any gross hematuria at the present  time.  She was voiding frequently and continued to have dysuria and bladder spasms.  No flank pain or abdominal pain.  No nausea or vomiting.  She had completed her antibiotics. Creatinine from 04/23/2022 stable at 1.87. PET scan from 05/02/2022 showed irregular contour of the bladder consistent with the known bladder cancer, no evidence of metastatic disease, no evidence of ureteral obstruction. She underwent further management with palliative radiation therapy, completing a total of 8 treatments on 06/05/22.   She fell at home and sustained a head injury with intracranial hemorrhage.  She was admitted to the hospital and subsequently to inpatient rehab.  She is now back at home. She has been voiding spontaneously.  She reported some improvement in her dysuria. She continued to use Uribel prn and Myrbetriq 25 mg daily. She was evaluated for increased dysuria.  A urinalysis from 06/24/2022 showed >30 WBCs, >30 RBCs, and moderate bacteria.  Urine culture grew 50-100 K of >2 organisms.  She was treated with cefdinir. She continued to have urinary symptoms as well as an abnormal urinalysis. Resolved MDX urine culture from 07/16/2022 grew Enterococcus, E. coli, and Klebsiella.  She was treated with a combination of fosfomycin and Bactrim. She completed the fosfomycin and Bactrim and noted improvement in her bladder symptoms following the antibiotics.  She was not having significant dysuria.  No gross hematuria or flank pain.  She continued with frequency, urgency, and incontinence.  She noticed improvement in her symptoms with the higher dose of Myrbetriq.  She returns today for follow-up.  Overall, she is doing well.  She  is not having any significant dysuria.  No gross hematuria or flank pain.  No weight loss.  She last took the fosfomycin approximately 10 days ago.  She continues with frequency, urgency, and urinary incontinence.  She continues on Myrbetriq 50 mg daily.  She has not had to take the Uribel for  some time.  Portions of the above documentation were copied from a prior visit for review purposes only.  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 due to gastritis or adverse drug effect (nonsteroidals, small bowel ulceration with Fosamax); caused by Pepto-Bismol during one Emergency Department evaluation    PSH: Past Surgical History:  Procedure Laterality Date   ABDOMINAL HYSTERECTOMY     emergency s/p delivery   ABDOMINAL HYSTERECTOMY  1960   massive gynecologic bleeding   BOWEL RESECTION     Resulting short gut syndrome   CHOLECYSTECTOMY N/A 06/25/2018   Procedure: LAPAROSCOPIC CHOLECYSTECTOMY WITH INTRAOPERATIVE CHOLANGIOGRAM;  Surgeon: Armandina Gemma, MD;  Location: [facility];  Service: General;  Laterality: N/A;   COLONOSCOPY W/ POLYPECTOMY  2005   Lipoma; diverticulosis   COLONOSCOPY WITH ESOPHAGOGASTRODUODENOSCOPY (EGD)  11/22/2012   [facility]   CYSTOSCOPY W/ RETROGRADES Bilateral 04/16/2022   Procedure: CYSTOSCOPY;  Surgeon: Primus Bravo., MD;  Location: [facility];  Service: Urology;  Laterality: Bilateral;   ESOPHAGOGASTRODUODENOSCOPY (EGD) WITH PROPOFOL N/A 04/04/2022   Procedure: ESOPHAGOGASTRODUODENOSCOPY (EGD) WITH PROPOFOL;  Surgeon: Rogene Houston, MD;  [facility];  Service: Endoscopy;  Laterality: N/A;  210   HIP ARTHROPLASTY Right 06/15/2020   Procedure: ANTERIOR ARTHROPLASTY BIPOLAR HIP (HEMIARTHROPLASTY);  Surgeon: Mcarthur Rossetti, MD;  Location: [facility];  Service: Orthopedics;  Laterality: Right;   LAPAROSCOPIC LYSIS OF ADHESIONS  1965   s/p adhesions   MASTECTOMY  right breast   MASTECTOMY     Carcinoma of the breast; right   TRANSURETHRAL RESECTION OF BLADDER TUMOR N/A 04/16/2022   Procedure: TRANSURETHRAL RESECTION OF BLADDER TUMOR (TURBT);  Surgeon: Primus Bravo., MD;  Location: [facility];  Service: Urology;  Laterality: N/A;   UPPER GASTROINTESTINAL ENDOSCOPY      SH: Social History   Tobacco Use   Smoking status: Former    Packs/day: 1.50    Years: 20.00    Total pack years: 30.00    Types: Cigarettes   Smokeless tobacco: Never  Vaping Use   Vaping Use: Never used  Substance Use Topics   Alcohol use: No   Drug use: No    ROS: Constitutional:  Negative for fever, chills, weight loss CV: Negative for chest pain, previous MI, hypertension Respiratory:  Negative  for shortness of breath, wheezing, sleep apnea, frequent cough GI:  Negative for nausea, vomiting, bloody stool, GERD  PE: BP 135/72   Pulse 74  GENERAL APPEARANCE:  Well appearing, well developed, well nourished, NAD HEENT:  Atraumatic, normocephalic, oropharynx clear NECK:  Supple without lymphadenopathy or thyromegaly ABDOMEN:  Soft, non-tender, no masses EXTREMITIES:  Moves all extremities well, without clubbing, cyanosis, or edema NEUROLOGIC:  Alert and oriented x 3, in wheelchair, CN II-XII grossly intact MENTAL STATUS:  appropriate BACK:  Non-tender to palpation, No CVAT SKIN:  Warm, dry, and intact   Results: No sample given

## 2022-09-09 NOTE — Progress Notes (Signed)
post void residual=22 

## 2022-09-17 ENCOUNTER — Other Ambulatory Visit: Payer: Self-pay | Admitting: Urology

## 2022-09-24 DIAGNOSIS — G959 Disease of spinal cord, unspecified: Secondary | ICD-10-CM | POA: Diagnosis not present

## 2022-09-30 ENCOUNTER — Other Ambulatory Visit: Payer: Self-pay | Admitting: Neurosurgery

## 2022-10-13 NOTE — Pre-Procedure Instructions (Signed)
Surgical Instructions    Your procedure is scheduled on Friday 10/17/22.   Report to [facility] Main Entrance "A" at 12:30 P.M., then check in with the Admitting office.  Call this number if you have problems the morning of surgery:  [phone]   If you have any questions prior to your surgery date call [phone]: Open Monday-Friday 8am-4pm If you experience any cold or flu symptoms such as cough, fever, chills, shortness of breath, etc. between now and your scheduled surgery, please notify us at the above number     Remember:  Do not eat or drink after midnight the night before your surgery    Take these medicines the morning of surgery with A SIP OF WATER:   DULoxetine (CYMBALTA)   gabapentin (NEURONTIN)   levothyroxine (SYNTHROID)   metoprolol succinate (TOPROL-XL)   mirabegron ER (MYRBETRIQ)       Take these medicines if needed:   acetaminophen (TYLENOL)   methocarbamol (ROBAXIN)   As of today, STOP taking any Aspirin (unless otherwise instructed by your surgeon) Aleve, Naproxen, Ibuprofen, Motrin, Advil, Goody's, BC's, all herbal medications, fish oil, and all vitamins.           Do not wear jewelry or makeup. Do not wear lotions, powders, perfumes/cologne or deodorant. Do not shave 48 hours prior to surgery.  Men may shave face and neck. Do not bring valuables to the hospital. Do not wear nail polish, gel polish, artificial nails, or any other type of covering on natural nails (fingers and toes) If you have artificial nails or gel coating that need to be removed by a nail salon, please have this removed prior to surgery. Artificial nails or gel coating may interfere with anesthesia's ability to adequately monitor your vital signs.  [facility] is not responsible for any belongings or valuables.    Do NOT Smoke (Tobacco/Vaping)  24 hours prior to your procedure  If you use a CPAP at night, you may bring your mask for your overnight stay.   Contacts, glasses,  hearing aids, dentures or partials may not be worn into surgery, please bring cases for these belongings   For patients admitted to the hospital, discharge time will be determined by your treatment team.   Patients discharged the day of surgery will not be allowed to drive home, and someone needs to stay with them for 24 hours.   SURGICAL WAITING ROOM VISITATION Patients having surgery or a procedure may have no more than 2 support people in the waiting area - these visitors may rotate.   Children under the age of 64 must have an adult with them who is not the patient. If the patient needs to stay at the hospital during part of their recovery, the visitor guidelines for inpatient rooms apply. Pre-op nurse will coordinate an appropriate time for 1 support person to accompany patient in pre-op.  This support person may not rotate.   Please refer to [email or web address] for the visitor guidelines for Inpatients (after your surgery is over and you are in a regular room).    Special instructions:    Oral Hygiene is also important to reduce your risk of infection.  Remember - BRUSH YOUR TEETH THE MORNING OF SURGERY WITH YOUR REGULAR TOOTHPASTE   [place]- Preparing For Surgery  Before surgery, you can play an important role. Because skin is not sterile, your skin needs to be as free of germs as possible. You can reduce the number of germs on your skin  by washing with CHG (chlorahexidine gluconate) Soap before surgery.  CHG is an antiseptic cleaner which kills germs and bonds with the skin to continue killing germs even after washing.     Please do not use if you have an allergy to CHG or antibacterial soaps. If your skin becomes reddened/irritated stop using the CHG.  Do not shave (including legs and underarms) for at least 48 hours prior to first CHG shower. It is OK to shave your face.  Please follow these instructions carefully.      Shower the NIGHT BEFORE SURGERY and the MORNING OF SURGERY with CHG Soap.   If you chose to wash your hair, wash your hair first as usual with your normal shampoo. After you shampoo, rinse your hair and body thoroughly to remove the shampoo.  Then [employer] and genitals (private parts) with your normal soap and rinse thoroughly to remove soap.  After that Use CHG Soap as you would any other liquid soap. You can apply CHG directly to the skin and wash gently with a scrungie or a clean washcloth.   Apply the CHG Soap to your body ONLY FROM THE NECK DOWN.  Do not use on open wounds or open sores. Avoid contact with your eyes, ears, mouth and genitals (private parts). Wash Face and genitals (private parts)  with your normal soap.   Wash thoroughly, paying special attention to the area where your surgery will be performed.  Thoroughly rinse your body with warm water from the neck down.  DO NOT shower/wash with your normal soap after using and rinsing off the CHG Soap.  Pat yourself dry with a CLEAN TOWEL.  Wear CLEAN PAJAMAS to bed the night before surgery  Place CLEAN SHEETS on your bed the night before your surgery  DO NOT SLEEP WITH PETS.   Day of Surgery:  Take a shower with CHG soap. Wear Clean/Comfortable clothing the morning of surgery Do not apply any deodorants/lotions.   Remember to brush your teeth WITH YOUR REGULAR TOOTHPASTE.    If you received a COVID test during your pre-op visit, it is requested that you wear a mask when out in public, stay away from anyone that may not be feeling well, and notify your surgeon if you develop symptoms. If you have been in contact with anyone that has tested positive in the last 10 days, please notify your surgeon.    Please read over the following fact sheets that you were given.

## 2022-10-14 ENCOUNTER — Encounter (HOSPITAL_COMMUNITY)
Admission: RE | Admit: 2022-10-14 | Discharge: 2022-10-14 | Disposition: A | Discharge status: Home / Self Care | Payer: Medicare Other | Source: Ambulatory Visit | Attending: Neurosurgery | Admitting: Neurosurgery

## 2022-10-14 ENCOUNTER — Ambulatory Visit (HOSPITAL_COMMUNITY)
Admission: RE | Admit: 2022-10-14 | Discharge: 2022-10-14 | Disposition: A | Discharge status: Home / Self Care | Payer: Medicare Other | Source: Ambulatory Visit | Attending: Urology | Admitting: Urology

## 2022-10-14 ENCOUNTER — Encounter (HOSPITAL_COMMUNITY): Payer: Self-pay

## 2022-10-14 ENCOUNTER — Other Ambulatory Visit: Payer: Self-pay

## 2022-10-14 VITALS — BP 150/95 | HR 78 | Temp 97.6°F | Resp 16 | Ht 61.0 in | Wt 112.8 lb

## 2022-10-14 DIAGNOSIS — Z01818 Encounter for other preprocedural examination: Secondary | ICD-10-CM

## 2022-10-14 DIAGNOSIS — N1330 Unspecified hydronephrosis: Secondary | ICD-10-CM | POA: Diagnosis not present

## 2022-10-14 DIAGNOSIS — C670 Malignant neoplasm of trigone of bladder: Secondary | ICD-10-CM | POA: Insufficient documentation

## 2022-10-14 DIAGNOSIS — N200 Calculus of kidney: Secondary | ICD-10-CM | POA: Diagnosis not present

## 2022-10-14 LAB — CBC
HCT: 31.5 % — ABNORMAL LOW (ref 36.0–46.0)
Hemoglobin: 9.9 g/dL — ABNORMAL LOW (ref 12.0–15.0)
MCH: 27.7 pg (ref 26.0–34.0)
MCHC: 31.4 g/dL (ref 30.0–36.0)
MCV: 88 fL (ref 80.0–100.0)
Platelets: 224 10*3/uL (ref 150–400)
RBC: 3.58 MIL/uL — ABNORMAL LOW (ref 3.87–5.11)
RDW: 15.7 % — ABNORMAL HIGH (ref 11.5–15.5)
WBC: 7 10*3/uL (ref 4.0–10.5)
nRBC: 0 % (ref 0.0–0.2)

## 2022-10-14 LAB — BASIC METABOLIC PANEL
Anion gap: 13 (ref 5–15)
BUN: 27 mg/dL — ABNORMAL HIGH (ref 8–23)
CO2: 20 mmol/L — ABNORMAL LOW (ref 22–32)
Calcium: 9.7 mg/dL (ref 8.9–10.3)
Chloride: 106 mmol/L (ref 98–111)
Creatinine, Ser: 1.67 mg/dL — ABNORMAL HIGH (ref 0.44–1.00)
GFR, Estimated: 28 mL/min — ABNORMAL LOW (ref >60)
Glucose, Bld: 131 mg/dL — ABNORMAL HIGH (ref 70–99)
Potassium: 4.7 mmol/L (ref 3.5–5.1)
Sodium: 139 mmol/L (ref 135–145)

## 2022-10-14 LAB — SURGICAL PCR SCREEN
MRSA, PCR: NEGATIVE
Staphylococcus aureus: NEGATIVE

## 2022-10-14 NOTE — Progress Notes (Addendum)
PCP - Dr. Redmond School Cardiologist - Dr. Rozann Lesches Urologist- Dr. Michaelle Birks  PPM/ICD - n/a Device Orders - n/a Rep Notified - n/a  Chest x-ray - 05/16/22 EKG - 05/13/22 Stress Test - denies ECHO - 08/28/20 Cardiac Cath - denies  Sleep Study - denies CPAP - n/a  Fasting Blood Sugar - n/a Checks Blood Sugar _____ times a day- n/a  Last dose of GLP1 agonist- n/a GLP1 instructions: n/a  Blood Thinner Instructions: n/a Aspirin Instructions: n/a  ERAS Protcol - NPO PRE-SURGERY Ensure or G2- n/a  COVID TEST- n/a   Anesthesia review: Yes. Cardiac History. BP 150/95. Per patient's niece, patient took Metoprolol this morning and BP does not normally run high. Patient's niece thinks it's elevated due to patient activity with getting out of the car and into the building for PAT appointment. Patient left prior to having BP rechecked.  Hgb- 9.9. Review per anesthesia values for Hemoglobin less than 10.  Patient denies shortness of breath, fever, cough and chest pain at PAT appointment   All instructions explained to the patient, with a verbal understanding of the material. Patient agrees to go over the instructions while at home for a better understanding. The opportunity to ask questions was provided.

## 2022-10-15 ENCOUNTER — Encounter: Payer: Medicare Other | Admitting: Physical Medicine & Rehabilitation

## 2022-10-15 NOTE — Anesthesia Preprocedure Evaluation (Addendum)
Anesthesia Evaluation  Patient identified by MRN, date of birth, ID band Patient awake    Reviewed: Allergy & Precautions, NPO status , Patient's Chart, lab work & pertinent test results, reviewed documented beta blocker date and time   History of Anesthesia Complications Negative for: history of anesthetic complications  Airway Mallampati: II  TM Distance: >3 FB Neck ROM: Full    Dental no notable dental hx. (+) Dental Advisory Given   Pulmonary former smoker   Pulmonary exam normal        Cardiovascular hypertension, Pt. on medications and Pt. on home beta blockers + Valvular Problems/Murmurs AS  Rhythm:Regular Rate:Normal + Systolic murmurs    Neuro/Psych  PSYCHIATRIC DISORDERS Anxiety Depression       GI/Hepatic Neg liver ROS,GERD  ,,  Endo/Other  Hypothyroidism    Renal/GU Renal disease     Musculoskeletal   Abdominal   Peds  Hematology   Anesthesia Other Findings   Reproductive/Obstetrics                             Anesthesia Physical Anesthesia Plan  ASA: 3  Anesthesia Plan: General   Post-op Pain Management: Tylenol PO (pre-op)*   Induction: Intravenous  PONV Risk Score and Plan: 4 or greater and Ondansetron, Dexamethasone and Diphenhydramine  Airway Management Planned: Oral ETT and Video Laryngoscope Planned  Additional Equipment: None  Intra-op Plan:   Post-operative Plan: Extubation in OR  Informed Consent: I have reviewed the patients History and Physical, chart, labs and discussed the procedure including the risks, benefits and alternatives for the proposed anesthesia with the patient or authorized representative who has indicated his/her understanding and acceptance.     Dental advisory given  Plan Discussed with: Anesthesiologist and CRNA  Anesthesia Plan Comments: (PAT note by Karoline Caldwell, PA-C: Follows with cardiology for hx of Paroxysmal atrial  fibrillation with CHA2DS2-VASc score of 5, mild to moderate aortic stenosis (mean gradient 11.7 mmHg by echo 08/2020). She is not anticoagulated with previous history of recurrent GI bleed, also status post fall and bladder cancer.  Last seen by Dr. Domenic Polite 08/13/2022.  Stable at that time, no changes to management, 84-monthfollow-up recommended.  History of bladder cancer s/p transurethral resection of bladder tumor 04/16/2022.  Subsequently underwent radiation therapy, completing a total of 8 treatments on 06/05/2022.  CKD 3b followed by nephrologist Dr. BTheador Hawthorne  Preop labs reviewed, creatinine 1.67 c/w history of CKD, anemia with hgb 9.9, otherwise unremarkable.  EKG 05/13/22: Sinus rhythm. Rate 89. Prolonged PR interval. Abnormal R-wave progression, early transition  Echocardiogram 08/28/2020: 1. Left ventricular ejection fraction, by estimation, is 60 to 65%. The  left ventricle has normal function. There is moderate left ventricular  hypertrophy of the basal-septal segment.  2. Right ventricular systolic function is normal. The right ventricular  size is normal.  3. The aortic valve is tricuspid. There is moderate calcification of the  aortic valve. There is moderate thickening of the aortic valve. Aortic  valve regurgitation is not visualized. Mild to moderate aortic valve  stenosis. Aortic valve mean gradient  measures 11.7 mmHg. Aortic valve peak gradient measures 21.6 mmHg. Aortic  valve area, by VTI measures 1.36 cm.  4. The inferior vena cava is normal in size with greater than 50%  respiratory variability, suggesting right atrial pressure of 3 mmHg.  5. Limited echo to evaluate LV function and aortic stenosis  Cardiac monitor September 2021: [employer] monitor reviewed. 30 days  analyzed. Predominant rhythm is sinus with heart rate ranging from 54 bpm up to 127 bpm and average heart rate 72 bpm. Episode of syncope reported on October 9 correlates with what appears to be  lead loss/artifact rather than ventricular fibrillation, particularly since there was presumably spontaneous improvement based on subsequent recordings showing sinus rhythm. There is no onset or offset documented for review.  )        Anesthesia Quick Evaluation

## 2022-10-15 NOTE — Progress Notes (Signed)
Anesthesia Chart Review:  Follows with cardiology for hx of Paroxysmal atrial fibrillation with CHA2DS2-VASc score of 5, mild to moderate aortic stenosis (mean gradient 11.7 mmHg by echo 08/2020).  She is not anticoagulated with previous history of recurrent GI bleed, also status post fall and bladder cancer.  Last seen by Dr. Domenic Polite 08/13/2022.  Stable at that time, no changes to management, 66-monthfollow-up recommended.  History of bladder cancer s/p transurethral resection of bladder tumor 04/16/2022.  Subsequently underwent radiation therapy, completing a total of 8 treatments on 06/05/2022.  CKD 3b followed by nephrologist Dr. BTheador Hawthorne  Preop labs reviewed, creatinine 1.67 c/w history of CKD, anemia with hgb 9.9, otherwise unremarkable.  EKG 05/13/22: Sinus rhythm. Rate 89. Prolonged PR interval. Abnormal R-wave progression, early transition  Echocardiogram 08/28/2020:  1. Left ventricular ejection fraction, by estimation, is 60 to 65%. The  left ventricle has normal function. There is moderate left ventricular  hypertrophy of the basal-septal segment.   2. Right ventricular systolic function is normal. The right ventricular  size is normal.   3. The aortic valve is tricuspid. There is moderate calcification of the  aortic valve. There is moderate thickening of the aortic valve. Aortic  valve regurgitation is not visualized. Mild to moderate aortic valve  stenosis. Aortic valve mean gradient  measures 11.7 mmHg. Aortic valve peak gradient measures 21.6 mmHg. Aortic  valve area, by VTI measures 1.36 cm.   4. The inferior vena cava is normal in size with greater than 50%  respiratory variability, suggesting right atrial pressure of 3 mmHg.   5. Limited echo to evaluate LV function and aortic stenosis    Cardiac monitor September 2021: [employer] monitor reviewed.  30 days analyzed.  Predominant rhythm is sinus with heart rate ranging from 54 bpm up to 127 bpm and average heart rate 72  bpm.  Episode of syncope reported on October 9 correlates with what appears to be lead loss/artifact rather than ventricular fibrillation, particularly since there was presumably spontaneous improvement based on subsequent recordings showing sinus rhythm.  There is no onset or offset documented for review.   JWynonia MustyMNorfolk Regional CenterShort Stay Center/Anesthesiology Phone ([phone]/12/2021 10:39 AM

## 2022-10-17 ENCOUNTER — Ambulatory Visit (HOSPITAL_COMMUNITY): Payer: Medicare Other

## 2022-10-17 ENCOUNTER — Encounter (HOSPITAL_COMMUNITY): Payer: Self-pay | Admitting: Neurosurgery

## 2022-10-17 ENCOUNTER — Inpatient Hospital Stay (HOSPITAL_COMMUNITY)
Admission: RE | Disposition: A | Discharge status: Home / Self Care | Payer: Self-pay | Source: Home / Self Care | Attending: Neurosurgery

## 2022-10-17 ENCOUNTER — Ambulatory Visit (HOSPITAL_COMMUNITY): Payer: Medicare Other | Admitting: Physician Assistant

## 2022-10-17 ENCOUNTER — Ambulatory Visit (HOSPITAL_BASED_OUTPATIENT_CLINIC_OR_DEPARTMENT_OTHER): Payer: Medicare Other | Admitting: Physician Assistant

## 2022-10-17 ENCOUNTER — Other Ambulatory Visit: Payer: Self-pay

## 2022-10-17 ENCOUNTER — Observation Stay (HOSPITAL_COMMUNITY)
Admission: RE | Admit: 2022-10-17 | Discharge: 2022-10-18 | Disposition: A | Discharge status: Home / Self Care | Payer: Medicare Other | Source: Home / Self Care | Attending: Neurosurgery | Admitting: Neurosurgery

## 2022-10-17 DIAGNOSIS — Z981 Arthrodesis status: Secondary | ICD-10-CM | POA: Diagnosis not present

## 2022-10-17 DIAGNOSIS — Z87891 Personal history of nicotine dependence: Secondary | ICD-10-CM | POA: Insufficient documentation

## 2022-10-17 DIAGNOSIS — G959 Disease of spinal cord, unspecified: Secondary | ICD-10-CM

## 2022-10-17 DIAGNOSIS — I10 Essential (primary) hypertension: Secondary | ICD-10-CM | POA: Insufficient documentation

## 2022-10-17 DIAGNOSIS — E039 Hypothyroidism, unspecified: Secondary | ICD-10-CM | POA: Insufficient documentation

## 2022-10-17 DIAGNOSIS — M50021 Cervical disc disorder at C4-C5 level with myelopathy: Secondary | ICD-10-CM | POA: Insufficient documentation

## 2022-10-17 DIAGNOSIS — M5001 Cervical disc disorder with myelopathy,  high cervical region: Secondary | ICD-10-CM | POA: Insufficient documentation

## 2022-10-17 DIAGNOSIS — Z9889 Other specified postprocedural states: Secondary | ICD-10-CM

## 2022-10-17 DIAGNOSIS — Z79899 Other long term (current) drug therapy: Secondary | ICD-10-CM | POA: Insufficient documentation

## 2022-10-17 DIAGNOSIS — M4802 Spinal stenosis, cervical region: Secondary | ICD-10-CM | POA: Insufficient documentation

## 2022-10-17 DIAGNOSIS — J189 Pneumonia, unspecified organism: Secondary | ICD-10-CM | POA: Diagnosis not present

## 2022-10-17 DIAGNOSIS — J90 Pleural effusion, not elsewhere classified: Secondary | ICD-10-CM | POA: Diagnosis not present

## 2022-10-17 DIAGNOSIS — G992 Myelopathy in diseases classified elsewhere: Secondary | ICD-10-CM | POA: Diagnosis not present

## 2022-10-17 DIAGNOSIS — J9589 Other postprocedural complications and disorders of respiratory system, not elsewhere classified: Secondary | ICD-10-CM | POA: Diagnosis not present

## 2022-10-17 DIAGNOSIS — Z853 Personal history of malignant neoplasm of breast: Secondary | ICD-10-CM | POA: Insufficient documentation

## 2022-10-17 DIAGNOSIS — I4891 Unspecified atrial fibrillation: Secondary | ICD-10-CM | POA: Insufficient documentation

## 2022-10-17 DIAGNOSIS — I350 Nonrheumatic aortic (valve) stenosis: Secondary | ICD-10-CM | POA: Diagnosis not present

## 2022-10-17 HISTORY — PX: ANTERIOR CERVICAL DECOMP/DISCECTOMY FUSION: SHX1161

## 2022-10-17 SURGERY — ANTERIOR CERVICAL DECOMPRESSION/DISCECTOMY FUSION 2 LEVELS
Anesthesia: General | Site: Neck

## 2022-10-17 MED ORDER — METOPROLOL SUCCINATE ER 25 MG PO TB24
25.0000 mg | ORAL_TABLET | Freq: Every morning | ORAL | Status: DC
  Administered 2022-10-18: 25 mg via ORAL
  Filled 2022-10-17: qty 1

## 2022-10-17 MED ORDER — SODIUM CHLORIDE 0.9% FLUSH: 3.0000 mL | INTRAVENOUS | Status: DC | PRN

## 2022-10-17 MED ORDER — LOPERAMIDE HCL 2 MG PO CAPS: 2.0000 mg | ORAL_CAPSULE | ORAL | Status: DC | PRN

## 2022-10-17 MED ORDER — DEXAMETHASONE SODIUM PHOSPHATE 10 MG/ML IJ SOLN
INTRAMUSCULAR | Status: AC
  Filled 2022-10-17: qty 1

## 2022-10-17 MED ORDER — SODIUM CHLORIDE 0.9 % IV SOLN: 250.0000 mL | INTRAVENOUS | Status: DC

## 2022-10-17 MED ORDER — 0.9 % SODIUM CHLORIDE (POUR BTL) OPTIME
TOPICAL | Status: DC | PRN
  Administered 2022-10-17: 1000 mL

## 2022-10-17 MED ORDER — METHOCARBAMOL 750 MG PO TABS
750.0000 mg | ORAL_TABLET | Freq: Four times a day (QID) | ORAL | Status: DC | PRN
  Administered 2022-10-18: 750 mg via ORAL
  Filled 2022-10-17: qty 1

## 2022-10-17 MED ORDER — ONDANSETRON HCL 4 MG/2ML IJ SOLN
INTRAMUSCULAR | Status: AC
  Filled 2022-10-17: qty 2

## 2022-10-17 MED ORDER — SUGAMMADEX SODIUM 200 MG/2ML IV SOLN
INTRAVENOUS | Status: DC | PRN
  Administered 2022-10-17 (×2): 100 mg via INTRAVENOUS

## 2022-10-17 MED ORDER — LIDOCAINE 2% (20 MG/ML) 5 ML SYRINGE
INTRAMUSCULAR | Status: DC | PRN
  Administered 2022-10-17: 60 mg via INTRAVENOUS

## 2022-10-17 MED ORDER — DULOXETINE HCL 30 MG PO CPEP
30.0000 mg | ORAL_CAPSULE | Freq: Every morning | ORAL | Status: DC
  Administered 2022-10-18: 30 mg via ORAL
  Filled 2022-10-17: qty 1

## 2022-10-17 MED ORDER — ACETAMINOPHEN 650 MG RE SUPP: 650.0000 mg | RECTAL | Status: DC | PRN

## 2022-10-17 MED ORDER — ACETAMINOPHEN 500 MG PO TABS
1000.0000 mg | ORAL_TABLET | Freq: Once | ORAL | Status: AC
  Administered 2022-10-17: 1000 mg via ORAL
  Filled 2022-10-17: qty 2

## 2022-10-17 MED ORDER — SODIUM CHLORIDE 0.9% FLUSH: 3.0000 mL | Freq: Two times a day (BID) | INTRAVENOUS | Status: DC

## 2022-10-17 MED ORDER — PROPOFOL 10 MG/ML IV BOLUS
INTRAVENOUS | Status: DC | PRN
  Administered 2022-10-17: 80 mg via INTRAVENOUS

## 2022-10-17 MED ORDER — ONDANSETRON HCL 4 MG/2ML IJ SOLN: 4.0000 mg | Freq: Four times a day (QID) | INTRAMUSCULAR | Status: DC | PRN

## 2022-10-17 MED ORDER — MIRABEGRON ER 50 MG PO TB24
50.0000 mg | ORAL_TABLET | Freq: Every day | ORAL | Status: DC
  Administered 2022-10-17 – 2022-10-18 (×2): 50 mg via ORAL
  Filled 2022-10-17 (×2): qty 1

## 2022-10-17 MED ORDER — ACETAMINOPHEN 325 MG PO TABS: 650.0000 mg | ORAL_TABLET | ORAL | Status: DC | PRN

## 2022-10-17 MED ORDER — FENTANYL CITRATE (PF) 100 MCG/2ML IJ SOLN
INTRAMUSCULAR | Status: AC
  Filled 2022-10-17: qty 2

## 2022-10-17 MED ORDER — MENTHOL 3 MG MT LOZG: 1.0000 | LOZENGE | OROMUCOSAL | Status: DC | PRN

## 2022-10-17 MED ORDER — HYDROMORPHONE HCL 1 MG/ML IJ SOLN: 1.0000 mg | INTRAMUSCULAR | Status: DC | PRN

## 2022-10-17 MED ORDER — DEXMEDETOMIDINE HCL IN NACL 80 MCG/20ML IV SOLN
INTRAVENOUS | Status: AC
  Filled 2022-10-17: qty 20

## 2022-10-17 MED ORDER — CEFAZOLIN SODIUM-DEXTROSE 2-4 GM/100ML-% IV SOLN
2.0000 g | INTRAVENOUS | Status: AC
  Administered 2022-10-17: 2 g via INTRAVENOUS

## 2022-10-17 MED ORDER — AMISULPRIDE (ANTIEMETIC) 5 MG/2ML IV SOLN: 5.0000 mg | Freq: Once | INTRAVENOUS | Status: DC | PRN

## 2022-10-17 MED ORDER — ONDANSETRON HCL 4 MG PO TABS: 4.0000 mg | ORAL_TABLET | Freq: Four times a day (QID) | ORAL | Status: DC | PRN

## 2022-10-17 MED ORDER — FENTANYL CITRATE (PF) 250 MCG/5ML IJ SOLN
INTRAMUSCULAR | Status: AC
  Filled 2022-10-17: qty 5

## 2022-10-17 MED ORDER — ONDANSETRON HCL 4 MG/2ML IJ SOLN
INTRAMUSCULAR | Status: DC | PRN
  Administered 2022-10-17: 4 mg via INTRAVENOUS

## 2022-10-17 MED ORDER — ALPRAZOLAM 0.5 MG PO TABS
1.0000 mg | ORAL_TABLET | Freq: Every day | ORAL | Status: DC
  Administered 2022-10-17: 1 mg via ORAL
  Filled 2022-10-17: qty 2

## 2022-10-17 MED ORDER — CHLORHEXIDINE GLUCONATE 0.12 % MT SOLN: 15.0000 mL | Freq: Once | OROMUCOSAL | Status: AC

## 2022-10-17 MED ORDER — CYCLOBENZAPRINE HCL 10 MG PO TABS: 10.0000 mg | ORAL_TABLET | Freq: Three times a day (TID) | ORAL | Status: DC | PRN

## 2022-10-17 MED ORDER — HYDROCODONE-ACETAMINOPHEN 10-325 MG PO TABS: 1.0000 | ORAL_TABLET | ORAL | Status: DC | PRN

## 2022-10-17 MED ORDER — URIBEL 118 MG PO CAPS: 1.0000 | ORAL_CAPSULE | Freq: Four times a day (QID) | ORAL | Status: DC | PRN

## 2022-10-17 MED ORDER — TAB-A-VITE/IRON PO TABS
1.0000 | ORAL_TABLET | Freq: Every morning | ORAL | Status: DC
  Administered 2022-10-18: 1 via ORAL
  Filled 2022-10-17: qty 1

## 2022-10-17 MED ORDER — GABAPENTIN 100 MG PO CAPS
200.0000 mg | ORAL_CAPSULE | Freq: Every day | ORAL | Status: DC
  Administered 2022-10-17: 200 mg via ORAL
  Filled 2022-10-17: qty 2

## 2022-10-17 MED ORDER — LIDOCAINE 2% (20 MG/ML) 5 ML SYRINGE
INTRAMUSCULAR | Status: AC
  Filled 2022-10-17: qty 5

## 2022-10-17 MED ORDER — PHENOL 1.4 % MT LIQD: 1.0000 | OROMUCOSAL | Status: DC | PRN

## 2022-10-17 MED ORDER — ROCURONIUM BROMIDE 10 MG/ML (PF) SYRINGE
PREFILLED_SYRINGE | INTRAVENOUS | Status: DC | PRN
  Administered 2022-10-17: 70 mg via INTRAVENOUS

## 2022-10-17 MED ORDER — FENTANYL CITRATE (PF) 100 MCG/2ML IJ SOLN
25.0000 ug | INTRAMUSCULAR | Status: DC | PRN
  Administered 2022-10-17: 25 ug via INTRAVENOUS

## 2022-10-17 MED ORDER — LEVOTHYROXINE SODIUM 75 MCG PO TABS
75.0000 ug | ORAL_TABLET | Freq: Every day | ORAL | Status: DC
  Administered 2022-10-18: 75 ug via ORAL
  Filled 2022-10-17: qty 1

## 2022-10-17 MED ORDER — CHLORHEXIDINE GLUCONATE CLOTH 2 % EX PADS: 6.0000 | MEDICATED_PAD | Freq: Once | CUTANEOUS | Status: DC

## 2022-10-17 MED ORDER — PROMETHAZINE HCL 25 MG/ML IJ SOLN: 6.2500 mg | INTRAMUSCULAR | Status: DC | PRN

## 2022-10-17 MED ORDER — PHENYLEPHRINE 80 MCG/ML (10ML) SYRINGE FOR IV PUSH (FOR BLOOD PRESSURE SUPPORT)
PREFILLED_SYRINGE | INTRAVENOUS | Status: DC | PRN
  Administered 2022-10-17: 80 ug via INTRAVENOUS

## 2022-10-17 MED ORDER — HYDROCODONE-ACETAMINOPHEN 5-325 MG PO TABS
1.0000 | ORAL_TABLET | ORAL | Status: DC | PRN
  Administered 2022-10-17 – 2022-10-18 (×3): 1 via ORAL
  Filled 2022-10-17 (×3): qty 1

## 2022-10-17 MED ORDER — CHLORHEXIDINE GLUCONATE 0.12 % MT SOLN
OROMUCOSAL | Status: AC
  Administered 2022-10-17: 15 mL via OROMUCOSAL
  Filled 2022-10-17: qty 15

## 2022-10-17 MED ORDER — ORAL CARE MOUTH RINSE: 15.0000 mL | Freq: Once | OROMUCOSAL | Status: AC

## 2022-10-17 MED ORDER — DULOXETINE HCL 30 MG PO CPEP
60.0000 mg | ORAL_CAPSULE | Freq: Every morning | ORAL | Status: DC
  Administered 2022-10-18: 60 mg via ORAL
  Filled 2022-10-17: qty 2

## 2022-10-17 MED ORDER — FENTANYL CITRATE (PF) 250 MCG/5ML IJ SOLN
INTRAMUSCULAR | Status: DC | PRN
  Administered 2022-10-17 (×2): 25 ug via INTRAVENOUS
  Administered 2022-10-17: 100 ug via INTRAVENOUS

## 2022-10-17 MED ORDER — CEFAZOLIN SODIUM-DEXTROSE 1-4 GM/50ML-% IV SOLN
1.0000 g | Freq: Three times a day (TID) | INTRAVENOUS | Status: AC
  Administered 2022-10-17 – 2022-10-18 (×2): 1 g via INTRAVENOUS
  Filled 2022-10-17 (×2): qty 50

## 2022-10-17 MED ORDER — TRAMADOL HCL 50 MG PO TABS: 25.0000 mg | ORAL_TABLET | Freq: Four times a day (QID) | ORAL | Status: DC | PRN

## 2022-10-17 MED ORDER — THROMBIN 5000 UNITS EX SOLR
CUTANEOUS | Status: AC
  Filled 2022-10-17: qty 15000

## 2022-10-17 MED ORDER — THROMBIN 5000 UNITS EX SOLR
OROMUCOSAL | Status: DC | PRN
  Administered 2022-10-17: 5 mL via TOPICAL

## 2022-10-17 MED ORDER — LACTATED RINGERS IV SOLN: INTRAVENOUS | Status: DC

## 2022-10-17 MED ORDER — SODIUM BICARBONATE 650 MG PO TABS
650.0000 mg | ORAL_TABLET | Freq: Two times a day (BID) | ORAL | Status: DC
  Administered 2022-10-17 – 2022-10-18 (×2): 650 mg via ORAL
  Filled 2022-10-17 (×2): qty 1

## 2022-10-17 MED ORDER — THROMBIN (RECOMBINANT) 5000 UNITS EX SOLR
CUTANEOUS | Status: DC | PRN
  Administered 2022-10-17: 10 mL via TOPICAL

## 2022-10-17 MED ORDER — PHENYLEPHRINE HCL-NACL 20-0.9 MG/250ML-% IV SOLN
INTRAVENOUS | Status: DC | PRN
  Administered 2022-10-17: 25 ug/min via INTRAVENOUS

## 2022-10-17 MED ORDER — GABAPENTIN 100 MG PO CAPS
100.0000 mg | ORAL_CAPSULE | Freq: Two times a day (BID) | ORAL | Status: DC
  Administered 2022-10-18: 100 mg via ORAL
  Filled 2022-10-17: qty 1

## 2022-10-17 MED ORDER — CEFAZOLIN SODIUM-DEXTROSE 2-4 GM/100ML-% IV SOLN
INTRAVENOUS | Status: AC
  Filled 2022-10-17: qty 100

## 2022-10-17 MED ORDER — DEXAMETHASONE SODIUM PHOSPHATE 10 MG/ML IJ SOLN
INTRAMUSCULAR | Status: DC | PRN
  Administered 2022-10-17: 4 mg via INTRAVENOUS

## 2022-10-17 SURGICAL SUPPLY — 56 items
APL SKNCLS STERI-STRIP NONHPOA (GAUZE/BANDAGES/DRESSINGS) ×1
BAG COUNTER SPONGE SURGICOUNT (BAG) ×1 IMPLANT
BAG DECANTER FOR FLEXI CONT (MISCELLANEOUS) ×1 IMPLANT
BAG SPNG CNTER NS LX DISP (BAG) ×1
BAND INSRT 18 STRL LF DISP RB (MISCELLANEOUS) ×2
BAND RUBBER #18 3X1/16 STRL (MISCELLANEOUS) ×2 IMPLANT
BENZOIN TINCTURE PRP APPL 2/3 (GAUZE/BANDAGES/DRESSINGS) ×1 IMPLANT
BIT DRILL 13 (BIT) IMPLANT
BUR MATCHSTICK NEURO 3.0 LAGG (BURR) ×1 IMPLANT
CAGE PEEK 6X14X11 (Cage) ×1 IMPLANT
CAGE PEEK 8X14X11 (Cage) IMPLANT
CANISTER SUCT 3000ML PPV (MISCELLANEOUS) ×1 IMPLANT
DRAPE C-ARM 42X72 X-RAY (DRAPES) ×2 IMPLANT
DRAPE LAPAROTOMY 100X72 PEDS (DRAPES) ×1 IMPLANT
DRAPE MICROSCOPE SLANT 54X150 (MISCELLANEOUS) ×1 IMPLANT
DURAPREP 6ML APPLICATOR 50/CS (WOUND CARE) ×1 IMPLANT
ELECT COATED BLADE 2.86 ST (ELECTRODE) ×1 IMPLANT
ELECT REM PT RETURN 9FT ADLT (ELECTROSURGICAL) ×1
ELECTRODE REM PT RTRN 9FT ADLT (ELECTROSURGICAL) ×1 IMPLANT
GAUZE 4X4 16PLY ~~LOC~~+RFID DBL (SPONGE) IMPLANT
GAUZE SPONGE 4X4 12PLY STRL (GAUZE/BANDAGES/DRESSINGS) ×1 IMPLANT
GLOVE BIOGEL PI IND STRL 7.5 (GLOVE) IMPLANT
GLOVE ECLIPSE 7.5 STRL STRAW (GLOVE) IMPLANT
GLOVE ECLIPSE 9.0 STRL (GLOVE) ×1 IMPLANT
GLOVE EXAM NITRILE XL STR (GLOVE) IMPLANT
GOWN STRL REUS W/ TWL LRG LVL3 (GOWN DISPOSABLE) IMPLANT
GOWN STRL REUS W/ TWL XL LVL3 (GOWN DISPOSABLE) ×1 IMPLANT
GOWN STRL REUS W/TWL 2XL LVL3 (GOWN DISPOSABLE) IMPLANT
GOWN STRL REUS W/TWL LRG LVL3 (GOWN DISPOSABLE) ×2
GOWN STRL REUS W/TWL XL LVL3 (GOWN DISPOSABLE) ×1
HALTER HD/CHIN CERV TRACTION D (MISCELLANEOUS) ×1 IMPLANT
HEMOSTAT POWDER KIT SURGIFOAM (HEMOSTASIS) IMPLANT
HEMOSTAT SURGICEL 2X14 (HEMOSTASIS) IMPLANT
KIT BASIN OR (CUSTOM PROCEDURE TRAY) ×1 IMPLANT
KIT TURNOVER KIT B (KITS) ×1 IMPLANT
NDL SPNL 20GX3.5 QUINCKE YW (NEEDLE) ×1 IMPLANT
NEEDLE SPNL 20GX3.5 QUINCKE YW (NEEDLE) ×1 IMPLANT
NS IRRIG 1000ML POUR BTL (IV SOLUTION) ×1 IMPLANT
PACK LAMINECTOMY NEURO (CUSTOM PROCEDURE TRAY) ×1 IMPLANT
PAD ARMBOARD 7.5X6 YLW CONV (MISCELLANEOUS) ×3 IMPLANT
PLATE VISION ELITE 40MM (Plate) IMPLANT
SCREW ST 13X4XST VA NS SPNE (Screw) IMPLANT
SCREW ST VAR 4 ATL (Screw) ×6 IMPLANT
SPACER SPNL 11X14X6XPEEK CVD (Cage) IMPLANT
SPCR SPNL 11X14X6XPEEK CVD (Cage) ×1 IMPLANT
SPONGE INTESTINAL PEANUT (DISPOSABLE) ×1 IMPLANT
SPONGE SURGIFOAM ABS GEL SZ50 (HEMOSTASIS) ×1 IMPLANT
STRIP CLOSURE SKIN 1/2X4 (GAUZE/BANDAGES/DRESSINGS) ×1 IMPLANT
SUT VIC AB 3-0 SH 8-18 (SUTURE) ×1 IMPLANT
SUT VIC AB 4-0 RB1 18 (SUTURE) ×1 IMPLANT
TAPE CLOTH 4X10 WHT NS (GAUZE/BANDAGES/DRESSINGS) ×1 IMPLANT
TAPE CLOTH SURG 4X10 WHT LF (GAUZE/BANDAGES/DRESSINGS) IMPLANT
TOWEL GREEN STERILE (TOWEL DISPOSABLE) ×1 IMPLANT
TOWEL GREEN STERILE FF (TOWEL DISPOSABLE) ×1 IMPLANT
TRAP SPECIMEN MUCUS 40CC (MISCELLANEOUS) ×1 IMPLANT
WATER STERILE IRR 1000ML POUR (IV SOLUTION) ×1 IMPLANT

## 2022-10-17 NOTE — Progress Notes (Signed)
Orthopedic Tech Progress Note Patient Details:  Jane Andrews Feb 10, 1925 [id number]  Ortho Devices Type of Ortho Device: Soft collar Ortho Device/Splint Interventions: Ordered     Dropped off with PACU RN. Vernona Rieger 10/17/2022, 6:16 PM

## 2022-10-17 NOTE — Anesthesia Procedure Notes (Signed)
Procedure Name: Intubation Date/Time: 10/17/2022 3:24 PM  Performed by: Carolan Clines, CRNAPre-anesthesia Checklist: Patient identified, Emergency Drugs available, Suction available and Patient being monitored Patient Re-evaluated:Patient Re-evaluated prior to induction Oxygen Delivery Method: Circle System Utilized Preoxygenation: Pre-oxygenation with 100% oxygen Induction Type: IV induction Ventilation: Mask ventilation without difficulty Laryngoscope Size: Glidescope and 3 Grade View: Grade I Tube type: Oral Tube size: 7.0 mm Number of attempts: 1 Airway Equipment and Method: Rigid stylet and Video-laryngoscopy Placement Confirmation: ETT inserted through vocal cords under direct vision, positive ETCO2 and breath sounds checked- equal and bilateral Secured at: 21 cm Tube secured with: Tape Dental Injury: Teeth and Oropharynx as per pre-operative assessment  Comments: Elective glidescope intubation d/t cervical myelopathy.

## 2022-10-17 NOTE — Brief Op Note (Signed)
10/17/2022  5:06 PM  PATIENT:  Jane Andrews  88 y.o. female  PRE-OPERATIVE DIAGNOSIS:  Myelopathy  POST-OPERATIVE DIAGNOSIS:  Myelopathy  PROCEDURE:  Procedure(s) with comments: Anterior Cervical Discectomy and Fusion Cervical Three-Four/Cervical Four-Five (N/A) - 3C  SURGEON:  Surgeon(s) and Role:    * Earnie Larsson, MD - Primary  PHYSICIAN ASSISTANT:   ASSISTANTSMearl Latin   ANESTHESIA:   general  EBL:  100 mL   BLOOD ADMINISTERED:none  DRAINS: none   LOCAL MEDICATIONS USED:  NONE  SPECIMEN:  No Specimen  DISPOSITION OF SPECIMEN:  N/A  COUNTS:  YES  TOURNIQUET:  * No tourniquets in log *  DICTATION: .Dragon Dictation  PLAN OF CARE: Admit for overnight observation  PATIENT DISPOSITION:  PACU - hemodynamically stable.   Delay start of Pharmacological VTE agent (>24hrs) due to surgical blood loss or risk of bleeding: yes

## 2022-10-17 NOTE — Transfer of Care (Signed)
Immediate Anesthesia Transfer of Care Note  Patient: Jane Andrews  Procedure(s) Performed: Anterior Cervical Discectomy and Fusion Cervical Three-Four/Cervical Four-Five (Neck)  Patient Location: PACU  Anesthesia Type:General  Level of Consciousness: awake, alert , and oriented  Airway & Oxygen Therapy: Patient Spontanous Breathing  Post-op Assessment: Report given to RN and Post -op Vital signs reviewed and stable  Post vital signs: Reviewed and stable  Last Vitals:  Vitals Value Taken Time  BP 125/64 10/17/22 1715  Temp 36.6 C 10/17/22 1715  Pulse 80 10/17/22 1716  Resp 16 10/17/22 1716  SpO2 96 % 10/17/22 1716  Vitals shown include unvalidated device data.  Last Pain:  Vitals:   10/17/22 1248  TempSrc:   PainSc: 6       Patients Stated Pain Goal: 0 (70/78/67 5449)  Complications: No notable events documented.

## 2022-10-17 NOTE — Op Note (Signed)
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  Surgeon:Quintarius Ferns A.Daryan Cagley, M.D.  Asst. Surgeon: Reinaldo Meeker, NP  Anesthesia: General  Indication: Patient is a 88 year old female who status post fall approximately 6 months ago with resultant incomplete spinal cord injury.  She has made good recovery but has persistent numbness paresthesias and dysesthesias in both hands with some weakness in her hands and some bilateral gait instability.  The patient has ongoing cervical stenosis and cord compression.  We discussed options for further management.  She presents now for C3-4 and C4-5 anterior cervical decompression and fusion in hopes of improving her symptoms.  Operative note: After induction of anesthesia, patient positioned supine with her neck slightly extended and held in place with halter traction.  Patient's anterior cervical region prepped draped sterilely.  Incision made overlying C4.  Dissection performed on the right.  Retractor placed.  X-ray taken.  Level confirmed.  Disc spaces at C3-4 and C4-5 in size.  Discectomy then performed using various instruments down to level the posterior annulus.  Microscope brought to field used out the remainder of the discectomy and remaining aspects of annulus and osteophytes removed using high-speed drill down to level the posterior logical limb.  Posterior logical was then elevated and resected in a piecemeal fashion underlying thecal sac was identified.  Wide central decompression then performed undercutting the bodies of C3 and C4.  Decompression then proceeded into each neural foramina.  Wide anterior foraminotomies performed on the course exiting C4 nerve roots bilaterally.  At this point  a very thorough decompression of been achieved.  There was no evidence of injury to the thecal sac or nerve roots.  Procedures then repeated at C4-5 again without complication.  Wound was irrigated.  Gelfoam was placed topically for hemostasis then removed.  [employer]onic anatomic peek cage was then packed with locally harvested autograft and then impacted in the place at both levels where they were recessed slightly from the anterior cortical margin.  Atlantis anterior cervical plate was then placed over the C3, C4 and C5 levels.  This attached under fluoroscopic guidance using 13 mm variable angle screws to each at all 3 levels.  All screws given final tightening found to be solidly within the bone.  Locking screws engaged all 3 levels.  Final images reveal good position of the cages and the hardware at the proper operative level with normal alignment of spine.  Wound is then irrigated.  Hemostasis was assured.  Wounds then closed in a typical fashion.  Steri-Strips and sterile dressing were applied.  No apparent complications.  Patient tolerated the procedure well and she returns to the recovery room postop.

## 2022-10-17 NOTE — H&P (Signed)
Jane Andrews is an 88 y.o. female.   Chief Complaint: Neck pain and weakness HPI: 88 year old female approximately 6 months status post fall with resultant incomplete cervical spinal cord injury.  Work-up demonstrates evidence of marked spondylosis with severe stenosis at C3-4 and C4-5.  The patient has made very good recovery but still has painful paresthesias in both hands which are limiting her day-to-day activities.  She has diminished intrinsic strength and control.  She still has gait unsteadiness.  The patient is hopeful to receive benefit from decompressing her spinal cord.  She wants to move forward with two-level anterior cervical decompression and fusion surgery in hopes of improving her symptoms.  Past Medical History:  Diagnosis Date   Abdominal adhesions 1994   Allergic rhinitis    Anemia    Anxiety and depression    Aortic stenosis    Arthritis    Atrial fibrillation ([place]) 10/2012   Associated with severe anemia and esophageal pill impaction   Breast carcinoma ([facility])    Right mastectomy   Cholelithiasis    Essential hypertension    Gastroesophageal reflux disease    Hiatal hernia   History of blood transfusion    History of kidney stones    Hypothyroidism    Low back pain    Malabsorption    Short gut syndrome following small bowel resection surgery x2   Nephrolithiasis 2004   Painless hematuria   Short gut syndrome    Bowel resection , 2004   Upper GI bleed 2004   Multiple episodes of melena-? due to gastritis or adverse drug effect (nonsteroidals, small bowel ulceration with Fosamax); caused by Pepto-Bismol during one Emergency Department evaluation    Past Surgical History:  Procedure Laterality Date   ABDOMINAL HYSTERECTOMY     emergency s/p delivery   ABDOMINAL HYSTERECTOMY  1960   massive gynecologic bleeding   BOWEL RESECTION     Resulting short gut syndrome   CHOLECYSTECTOMY N/A 06/25/2018   Procedure: LAPAROSCOPIC CHOLECYSTECTOMY WITH  INTRAOPERATIVE CHOLANGIOGRAM;  Surgeon: Armandina Gemma, MD;  Location: [facility];  Service: General;  Laterality: N/A;   COLONOSCOPY W/ POLYPECTOMY  2005   Lipoma; diverticulosis   COLONOSCOPY WITH ESOPHAGOGASTRODUODENOSCOPY (EGD)  11/22/2012   [facility]   CYSTOSCOPY W/ RETROGRADES Bilateral 04/16/2022   Procedure: CYSTOSCOPY;  Surgeon: Primus Bravo., MD;  Location: [facility];  Service: Urology;  Laterality: Bilateral;   ESOPHAGOGASTRODUODENOSCOPY (EGD) WITH PROPOFOL N/A 04/04/2022   Procedure: ESOPHAGOGASTRODUODENOSCOPY (EGD) WITH PROPOFOL;  Surgeon: Rogene Houston, MD;  Location: [facility];  Service: Endoscopy;  Laterality: N/A;  210   HIP ARTHROPLASTY Right 06/15/2020   Procedure: ANTERIOR ARTHROPLASTY BIPOLAR HIP (HEMIARTHROPLASTY);  Surgeon: Mcarthur Rossetti, MD;  Location: [facility];  Service: Orthopedics;  Laterality: Right;   LAPAROSCOPIC LYSIS OF ADHESIONS  1965   s/p adhesions   MASTECTOMY  right breast   MASTECTOMY     Carcinoma of the breast; right   TRANSURETHRAL RESECTION OF BLADDER TUMOR N/A 04/16/2022   Procedure: TRANSURETHRAL RESECTION OF BLADDER TUMOR (TURBT);  Surgeon: Primus Bravo., MD;  Location: [facility];  Service: Urology;  Laterality: N/A;   UPPER GASTROINTESTINAL ENDOSCOPY      Family History  Problem Relation Age of Onset   Anuerysm Father    Rheum arthritis Sister    Healthy Sister    COPD Sister    Healthy Brother    Cancer Other    Colon cancer Neg Hx    Social History:  reports  that she has quit smoking. Her smoking use included cigarettes. She has a 30.00 pack-year smoking history. She has never been exposed to tobacco smoke. She has never used smokeless tobacco. She reports that she does not drink alcohol and does not use drugs.  Allergies:  Allergies  Allergen Reactions   Codeine Nausea And Vomiting    Medications Prior to Admission  Medication Sig Dispense Refill   acetaminophen (TYLENOL) 500 MG tablet Take 1 tablet (500 mg total) by  mouth 4 (four) times daily -  with meals and at bedtime. (Patient taking differently: Take 500 mg by mouth every 6 (six) hours as needed for moderate pain.)     ALPRAZolam (XANAX) 0.5 MG tablet Take 1 mg by mouth at bedtime.     DULoxetine (CYMBALTA) 30 MG capsule Take 30 mg by mouth every morning. Take with a 60 mg capsule for a total of 90 mg daily     DULoxetine (CYMBALTA) 60 MG capsule Take 60 mg by mouth every morning. T     fosfomycin (MONUROL) 3 g PACK 3 g by mouth every 10 days 9 g 3   gabapentin (NEURONTIN) 100 MG capsule Take 2 capsules (200 mg total) by mouth 3 (three) times daily. (Patient taking differently: Take 100-200 mg by mouth See admin instructions. Take 100 mg by mouth in the morning and afternoon and take 200 mg in the evening) 180 capsule 2   levothyroxine (SYNTHROID) 75 MCG tablet Take 1 tablet (75 mcg total) by mouth daily before breakfast. 30 tablet 0   loperamide (IMODIUM) 2 MG capsule TAKE ONE CAPSULE ('2MG'$  TOTAL) BY MOUTH FOUR TIMES DAILY AS NEEDED FOR DIARRHEA OR LOOSE STOOLS 60 capsule 0   Meth-Hyo-M Bl-Na Phos-Ph Sal (URIBEL) 118 MG CAPS Take 1 capsule (118 mg total) by mouth 4 (four) times daily as needed (pain with urination). 20 capsule 5   methocarbamol (ROBAXIN) 750 MG tablet Take 1 tablet (750 mg total) by mouth 4 (four) times daily. (Patient taking differently: Take 750 mg by mouth every 6 (six) hours as needed for muscle spasms.) 120 tablet 0   metoprolol succinate (TOPROL-XL) 25 MG 24 hr tablet Take 1 tablet (25 mg total) by mouth every morning. 30 tablet 0   mirabegron ER (MYRBETRIQ) 50 MG TB24 tablet Take 1 tablet (50 mg total) by mouth daily. 30 tablet 5   Multiple Vitamins-Iron (MULTIVITAMINS WITH IRON) TABS tablet Take 1 tablet by mouth every morning.     sodium bicarbonate 650 MG tablet Take 650 mg by mouth 2 (two) times daily.     diclofenac Sodium (VOLTAREN) 1 % GEL Apply 4 g topically 4 (four) times daily. (Patient not taking: Reported on 10/10/2022) 350  g 0   traMADol (ULTRAM) 50 MG tablet Take 0.5-1 tablets (25-50 mg total) by mouth every 6 (six) hours as needed. (Patient not taking: Reported on 10/10/2022) 20 tablet 0    No results found for this or any previous visit (from the past 48 hour(s)). No results found.  Pertinent items noted in HPI and remainder of comprehensive ROS otherwise negative.  Blood pressure (!) 159/89, pulse 77, temperature 98.7 F (37.1 C), temperature source Oral, resp. rate 17, height '5\' 1"'$  (1.549 m), weight 50.3 kg.  Patient is awake and alert.  She is oriented and appropriate.  Her speech is fluent.  Judgment insight are intact.  Cranial nerve function normal bilateral.  Motor examination reveals mild weakness of grip and intrinsic strength bilaterally otherwise motor strength is  reasonably intact.  Patient is awake and alert.  She is oriented and appropriate.  Her speech is fluent.  Judgment insight are intact.  Cranial nerve function normal bilateral.  Motor examination reveals mild weakness of grip and intrinsic strength bilaterally otherwise motor strength is reasonably intact.  Lower extremity strength is intact but with some increased tone.  Reflexes are increased.  Hoffmann's responses in both hands.  Sensory examination with patchy distal sensory loss in both upper extremities.  Examination head ears eyes nose and throat is unremarked.  Chest and abdomen are benign.  Extremities are free from injury deformity. Assessment/Plan Cervical stenosis with myelopathy status post remote cervical spinal cord injury.  Plan C3-4, C4-5 anterior cervical discectomy and fusion with interbody cages, local harvested autograft, and anterior plate instrumentation.  Risks and benefits been explained.  Patient wishes to proceed.  Mallie Mussel A Klinton Candelas 10/17/2022, 2:29 PM

## 2022-10-18 ENCOUNTER — Other Ambulatory Visit: Payer: Self-pay

## 2022-10-18 MED ORDER — METHOCARBAMOL 750 MG PO TABS: 750.0000 mg | ORAL_TABLET | Freq: Four times a day (QID) | ORAL | 0 refills | Status: DC

## 2022-10-18 MED ORDER — HYDROCODONE-ACETAMINOPHEN 5-325 MG PO TABS: 1.0000 | ORAL_TABLET | ORAL | 0 refills | Status: DC | PRN

## 2022-10-18 NOTE — TOC Transition Note (Signed)
Transition of Care [facility]) - CM/SW Discharge Note   Patient Details  Name: Jane Andrews MRN: [id number] Date of Birth: October 23, 1925  [facility] CM/SW Contact:  Carles Collet, RN Phone Number: 10/18/2022, 10:24 AM   Clinical Narrative:     Per bedside RN, attending Ronnald Ramp aware of recs for outpatient PT and agreeable to referral. Patient would like [facility], referral made and added to AVS        Patient Goals and CMS Choice        Discharge Placement                       Discharge Plan and Services                                     Social Determinants of Health (SDOH) Interventions Housing Interventions: Intervention Not Indicated   Readmission Risk Interventions    06/15/2020   10:21 AM  Readmission Risk Prevention Plan  Medication Screening Complete  Transportation Screening Complete

## 2022-10-18 NOTE — Discharge Instructions (Signed)
Wound Care Keep incision area dry.  You may remove outer bandage after 2 days and shower.   Do not put any creams, lotions, or ointments on incision. Leave steri-strips on neck.  They will fall off by themselves. Activity Walk each and every day, increasing distance each day. No lifting greater than 5 lbs.  Avoid excessive neck motion. No driving for 2 weeks; may ride as a passenger locally. Wear neck brace at all times except when showering. Diet Resume your normal diet.  Return to Work Will be discussed at you follow up appointment. Call Your Doctor If Any of These Occur Redness, drainage, or swelling at the wound.  Temperature greater than 101 degrees. Severe pain not relieved by pain medication. Increased difficulty swallowing.  Incision starts to come apart. Follow Up Appt Call today and ask for Rebecca for appointment in 1-2 weeks ([phone]) or for problems.  If you have any hardware placed in your spine, you will need an x-ray before your appointment.  

## 2022-10-18 NOTE — Evaluation (Signed)
Physical Therapy Evaluation Patient Details Name: Jane Andrews MRN: [id number] DOB: 28-Jul-1925 Today's Date: 10/18/2022  History of Present Illness  Jane Andrews is s/p C3-4, C4-5 anterior cervical discectomy and fusion due to fall approximately 6 months ago with resultant incomplete spinal cord injury.She has made good recovery but has persistent numbness paresthesias and dysesthesias in both hands with some weakness in her hands and some bilateral gait instability.  Clinical Impression  Pt presents to PT with deficits in strength, power, sensation, gait, balance, although she appears not far from her recent pre-surgical baseline. Pt is able to transfer and ambulate for household distances with limited assistance. Pt continues to have paresthesias in her finger, which she is upset have not immediately resolved after surgery. Pt will benefit from aggressive mobilization and continued education on neck precautions in an effort to protect her spine and improve mobility quality. PT recommends outpatient PT at the time of discharge.       Recommendations for follow up therapy are one component of a multi-disciplinary discharge planning process, led by the attending physician.  Recommendations may be updated based on patient status, additional functional criteria and insurance authorization.  Follow Up Recommendations Outpatient PT      Assistance Recommended at Discharge Intermittent Supervision/Assistance  Patient can return home with the following  A little help with walking and/or transfers;A little help with bathing/dressing/bathroom;Assistance with cooking/housework;Assist for transportation;Help with stairs or ramp for entrance    Equipment Recommendations None recommended by PT  Recommendations for Other Services       Functional Status Assessment Patient has had a recent decline in their functional status and demonstrates the ability to make significant improvements in function  in a reasonable and predictable amount of time.     Precautions / Restrictions Precautions Precautions: Fall;Cervical Precaution Booklet Issued: Yes (comment) Required Braces or Orthoses: Cervical Brace Cervical Brace: Soft collar;At all times Restrictions Weight Bearing Restrictions: No      Mobility  Bed Mobility               General bed mobility comments: pt declines bed mobility training, PT verbalized bed mobility technique to reinforce neck precautions as the pt reports her caregivers typically pull her up by her arms. PT instructs pt in log roll and for caregivers to support behind shoulder to assist trunk up from sidelying to sitting    Transfers Overall transfer level: Needs assistance Equipment used: Rolling walker (2 wheels) Transfers: Sit to/from Stand Sit to Stand: Min guard           General transfer comment: pt fails initial attempt but is successful on 2nd. She reports she is often unsuccessful on her 1st attempt    Ambulation/Gait Ambulation/Gait assistance: Min assist Gait Distance (Feet): 120 Feet Assistive device: Rolling walker (2 wheels) Gait Pattern/deviations: Step-through pattern Gait velocity: reduced Gait velocity interpretation: <1.31 ft/sec, indicative of household ambulator   General Gait Details: pt with increased lateral hip sway, reduced DF of R foot  Stairs Stairs: Yes Stairs assistance: Min guard Stair Management: One rail Right, Step to pattern, Sideways Number of Stairs: 3    Wheelchair Mobility    Modified Rankin (Stroke Patients Only)       Balance Overall balance assessment: Needs assistance Sitting-balance support: No upper extremity supported, Feet supported Sitting balance-Leahy Scale: Good     Standing balance support: Bilateral upper extremity supported, Reliant on assistive device for balance Standing balance-Leahy Scale: Poor  Pertinent Vitals/Pain Pain  Assessment Pain Assessment: 0-10 Pain Score: 6  Pain Location: neck Pain Descriptors / Indicators: Aching Pain Intervention(s): Patient requesting pain meds-RN notified    Home Living Family/patient expects to be discharged to:: Private residence Living Arrangements: Other (Comment) Available Help at Discharge: Available 24 hours/day;Personal care attendant Type of Home: House Home Access: Stairs to enter Entrance Stairs-Rails: Psychiatric nurse of Steps: 3   Home Layout: Able to live on main level with bedroom/bathroom Home Equipment: Conservation officer, nature (2 wheels);Cane - single point;Shower seat;Grab bars - tub/shower      Prior Function Prior Level of Function : Needs assist             Mobility Comments: pt ambulates for household distances with use of RW ADLs Comments: requires assistance for most ADLs due to impaired sensation in hands     Hand Dominance   Dominant Hand: Right    Extremity/Trunk Assessment   Upper Extremity Assessment Upper Extremity Assessment: Defer to OT evaluation    Lower Extremity Assessment Lower Extremity Assessment: Generalized weakness    Cervical / Trunk Assessment Cervical / Trunk Assessment: Neck Surgery  Communication   Communication: No difficulties  Cognition Arousal/Alertness: Awake/alert Behavior During Therapy: WFL for tasks assessed/performed Overall Cognitive Status: Within Functional Limits for tasks assessed                                          General Comments General comments (skin integrity, edema, etc.): VSS on RA    Exercises     Assessment/Plan    PT Assessment Patient needs continued PT services  PT Problem List Decreased strength;Decreased activity tolerance;Decreased balance;Decreased mobility;Impaired sensation       PT Treatment Interventions DME instruction;Gait training;Stair training;Functional mobility training;Therapeutic activities;Therapeutic  exercise;Balance training;Neuromuscular re-education;Patient/family education    PT Goals (Current goals can be found in the Care Plan section)  Acute Rehab PT Goals Patient Stated Goal: to go home, hopeful for improvement in sensation PT Goal Formulation: With patient Time For Goal Achievement: 11/01/22 Potential to Achieve Goals: Fair    Frequency Min 5X/week     Co-evaluation               AM-PAC PT "6 Clicks" Mobility  Outcome Measure Help needed turning from your back to your side while in a flat bed without using bedrails?: A Little Help needed moving from lying on your back to sitting on the side of a flat bed without using bedrails?: A Lot Help needed moving to and from a bed to a chair (including a wheelchair)?: A Little Help needed standing up from a chair using your arms (e.g., wheelchair or bedside chair)?: A Little Help needed to walk in hospital room?: A Little Help needed climbing 3-5 steps with a railing? : A Little 6 Click Score: 17    End of Session Equipment Utilized During Treatment: Cervical collar Activity Tolerance: Patient tolerated treatment well Patient left: in chair;with call bell/phone within reach Nurse Communication: Mobility status PT Visit Diagnosis: Other abnormalities of gait and mobility (R26.89);Muscle weakness (generalized) (M62.81);Other symptoms and signs involving the nervous system (K35.465)    Time: 6812-7517 PT Time Calculation (min) (ACUTE ONLY): 15 min   Charges:   PT Evaluation $PT Eval Low Complexity: Maxwell, PT, DPT Acute Rehabilitation Office [phone]  Zenaida Niece 10/18/2022, 9:08 AM

## 2022-10-18 NOTE — Progress Notes (Signed)
Patient is discharged from room 3C04 at this time. Alert and in stable condition. IV site d/c'd and instructions read to patient and niece with understanding verbalized and all questions answered. Left unit via wheelchair with all belongings at side.

## 2022-10-18 NOTE — Anesthesia Postprocedure Evaluation (Signed)
Anesthesia Post Note  Patient: Jane Andrews  Procedure(s) Performed: Anterior Cervical Discectomy and Fusion Cervical Three-Four/Cervical Four-Five (Neck)     Patient location during evaluation: PACU Anesthesia Type: General Level of consciousness: awake and alert Pain management: pain level controlled Vital Signs Assessment: post-procedure vital signs reviewed and stable Respiratory status: spontaneous breathing, nonlabored ventilation, respiratory function stable and patient connected to nasal cannula oxygen Cardiovascular status: blood pressure returned to baseline and stable Postop Assessment: no apparent nausea or vomiting Anesthetic complications: no   No notable events documented.            Effie Berkshire

## 2022-10-18 NOTE — Progress Notes (Signed)
Occupational Therapy Treatment and Discharge Patient Details Name: Jane Andrews MRN: [id number] DOB: 11-12-1925 Today's Date: 10/18/2022   History of present illness Ms. Maybury is s/p C3-4, C4-5 anterior cervical discectomy and fusion due to fall approximately 6 months ago with resultant incomplete spinal cord injury.She has made good recovery but has persistent numbness paresthesias and dysesthesias in both hands with some weakness in her hands and some bilateral gait instability.   OT comments  This 88 yo female seen for a 2nd time to go over information/education with pt's niece so she could relay to pt's PCA's at home. No further acute OT needs, we will sign off with the recommendation for OPOT.   Recommendations for follow up therapy are one component of a multi-disciplinary discharge planning process, led by the attending physician.  Recommendations may be updated based on patient status, additional functional criteria and insurance authorization.    Follow Up Recommendations  Outpatient OT    Assistance Recommended at Discharge Frequent or constant Supervision/Assistance  Patient can return home with the following  A little help with walking and/or transfers;A lot of help with bathing/dressing/bathroom;Assistance with cooking/housework;Help with stairs or ramp for entrance;Assist for transportation   Equipment Recommendations  None recommended by OT       Precautions / Restrictions Precautions Precautions: Fall;Cervical Precaution Booklet Issued: Yes (comment) Required Braces or Orthoses: Cervical Brace Cervical Brace: Soft collar;At all times Restrictions Weight Bearing Restrictions: No              ADL either performed or assessed with clinical judgement   ADL Overall ADL's : At baseline                                       General ADL Comments: Niece (Engineering geologist) in room and I was able to educate her on all the information on  the post op handout for cervical surgery so that she could relay to the PCA's at pt's house. I also wrote on the handout about pt using a dry washcloth every day in morning and night to rub between her hands so she could be able to tell if she felt the numbness/tingling was getting any better. I also spoke to both of them that pt could try some desensitaztion with her hands with rubbing them on different textures (ie: new kitchen scrub sponge, feeling around in dried rice and/or beans).    Extremity/Trunk Assessment Upper Extremity Assessment Upper Extremity Assessment: RUE deficits/detail;LUE deficits/detail RUE Deficits / Details: decreased sensation in hands, tingling, hypersensitive to touch; stronger for GM overall compared to LUE LUE Deficits / Details: decreased sensation in hands, tingling, hypersensitive to touch      Vision Baseline Vision/History: 1 Wears glasses Ability to See in Adequate Light: 0 Adequate Patient Visual Report: No change from baseline            Cognition Arousal/Alertness: Awake/alert Behavior During Therapy: WFL for tasks assessed/performed Overall Cognitive Status: Within Functional Limits for tasks assessed                                                General Comments VSS on RA    Pertinent Vitals/ Pain       Pain Assessment Pain Assessment: 0-10 Pain Score: 4  Pain Location: neck Pain Descriptors / Indicators: Aching, Sore Pain Intervention(s): Limited activity within patient's tolerance, Monitored during session      Progress Toward Goals  OT Goals(current goals can now be found in the care plan section)  Progress towards OT goals: Goals met/education completed, patient discharged from OT  Acute Rehab OT Goals Patient Stated Goal: for hands to no be numb and tingling any more OT Goal Formulation: With patient Time For Goal Achievement: 11/01/22 Potential to Achieve Goals: Good ADL Goals Additional ADL Goal #1:  Niece with verbalize understanding of handout and what information she needs to relay to pt's PCA's.  Plan Discharge plan remains appropriate       AM-PAC OT "6 Clicks" Daily Activity     Outcome Measure   Help from another person eating meals?: A Little Help from another person taking care of personal grooming?: A Lot Help from another person toileting, which includes using toliet, bedpan, or urinal?: A Lot Help from another person bathing (including washing, rinsing, drying)?: A Lot Help from another person to put on and taking off regular upper body clothing?: A Lot Help from another person to put on and taking off regular lower body clothing?: A Lot 6 Click Score: 13    End of Session Equipment Utilized During Treatment: Gait belt;Rolling walker (2 wheels);Cervical collar  OT Visit Diagnosis: Unsteadiness on feet (R26.81);Other abnormalities of gait and mobility (R26.89);Muscle weakness (generalized) (M62.81);Pain Pain - part of body:  (neck)   Activity Tolerance Patient tolerated treatment well   Patient Left in chair;with call bell/phone within reach;with family/visitor present   Nurse Communication  (pt ready to go from therapy standpoint as long as OP therapy services have been set up)        Time: 9242-6834 OT Time Calculation (min): 8 min  Charges: OT General Charges $OT Visit: 1 Visit OT Evaluation $OT Eval Moderate Complexity: 1 Mod OT Treatments $Self Care/Home Management : 8-22 mins  Golden Circle, OTR/L [facility] [phone] Office [phone]    Almon Register 10/18/2022, 10:50 AM

## 2022-10-18 NOTE — Evaluation (Signed)
Occupational Therapy Evaluation Patient Details Name: Jane Andrews MRN: [id number] DOB: Mar 29, 1925 Today's Date: 10/18/2022   History of Present Illness Ms. Pohlman is s/p C3-4, C4-5 anterior cervical discectomy and fusion due to fall approximately 6 months ago with resultant incomplete spinal cord injury.She has made good recovery but has persistent numbness paresthesias and dysesthesias in both hands with some weakness in her hands and some bilateral gait instability.   Clinical Impression   This 88 yo female admitted and underwent above presents to acute OT with all education completed with pt and handout provided. Pt has 24 hour PCAs at home that are not here to listen to what patient needs to be doing so I have asked the pt's RN to let me know when niece arrives so I can over information with her that she can then relay to the PCA's.       Recommendations for follow up therapy are one component of a multi-disciplinary discharge planning process, led by the attending physician.  Recommendations may be updated based on patient status, additional functional criteria and insurance authorization.   Follow Up Recommendations  Outpatient OT    Assistance Recommended at Discharge Frequent or constant Supervision/Assistance  Patient can return home with the following A little help with walking and/or transfers;A lot of help with bathing/dressing/bathroom;Assistance with cooking/housework;Help with stairs or ramp for entrance;Assist for transportation    Functional Status Assessment  Patient has had a recent decline in their functional status and demonstrates the ability to make significant improvements in function in a reasonable and predictable amount of time.  Equipment Recommendations  None recommended by OT       Precautions / Restrictions Precautions Precautions: Fall;Cervical Precaution Booklet Issued: Yes (comment) Required Braces or Orthoses: Cervical Brace Cervical Brace:  Soft collar;At all times Restrictions Weight Bearing Restrictions: No      Mobility Bed Mobility Overal bed mobility: Needs Assistance Bed Mobility: Rolling, Sidelying to Sit Rolling: Min assist Sidelying to sit: Min assist       General bed mobility comments: Encouraged her to make sure/direct caregivers as to help her in and OOB to protect her neck more. Pt reports normally the caregivers pull her up by her arms to get from supine to sit.    Transfers Overall transfer level: Needs assistance Equipment used: Rolling walker (2 wheels) Transfers: Sit to/from Stand Sit to Stand: Min guard           General transfer comment: increased time, may not always get up on first attempt      Balance Overall balance assessment: Needs assistance Sitting-balance support: No upper extremity supported, Feet supported Sitting balance-Leahy Scale: Good     Standing balance support: Bilateral upper extremity supported, Reliant on assistive device for balance Standing balance-Leahy Scale: Poor                             ADL either performed or assessed with clinical judgement   ADL Overall ADL's : At baseline                                       General ADL Comments: Needs A from PCAs for all basic ADLs due to her hands with decreased sensation (RUE stronger compared to LUE). Feel like she has the ability to do more with encouragement. Instructed her to use straws when drinking  any beverages and to to place a washcloth or cloth napkin between her neck and collar when eating and doing mouth care.     Vision Baseline Vision/History: 1 Wears glasses Ability to See in Adequate Light: 0 Adequate Patient Visual Report: No change from baseline              Pertinent Vitals/Pain Pain Assessment Pain Assessment: 0-10 Pain Score: 6  Pain Location: neck Pain Descriptors / Indicators: Aching, Sore Pain Intervention(s): Limited activity within patient's  tolerance, Monitored during session, Repositioned, Patient requesting pain meds-RN notified     Hand Dominance Right   Extremity/Trunk Assessment Upper Extremity Assessment Upper Extremity Assessment: RUE deficits/detail;LUE deficits/detail RUE Deficits / Details: decreased sensation in hands, tingling, hypersensitive to touch; stronger for GM overall compared to LUE LUE Deficits / Details: decreased sensation in hands, tingling, hypersensitive to touch     Communication Communication Communication: No difficulties   Cognition Arousal/Alertness: Awake/alert Behavior During Therapy: WFL for tasks assessed/performed Overall Cognitive Status: Within Functional Limits for tasks assessed                                                  Home Living Family/patient expects to be discharged to:: Private residence Living Arrangements: Other (Comment) (PCAs 24/7) Available Help at Discharge: Available 24 hours/day;Personal care attendant Type of Home: House Home Access: Stairs to enter [employer] of Steps: 3 Entrance Stairs-Rails: Right;Left Home Layout: Able to live on main level with bedroom/bathroom     Bathroom Shower/Tub: Teacher, early years/pre: Standard Bathroom Accessibility: Yes How Accessible: Accessible via walker Home Equipment: Rolling Walker (2 wheels);Cane - single point;Grab bars - tub/shower;Tub bench          Prior Functioning/Environment Prior Level of Function : Needs assist             Mobility Comments: pt ambulates for household distances with use of RW ADLs Comments: requires assistance for most ADLs due to impaired sensation in hands        OT Problem List: Decreased strength;Decreased range of motion;Impaired balance (sitting and/or standing);Decreased coordination;Decreased knowledge of precautions;Impaired UE functional use;Impaired sensation;Pain         OT Goals(Current goals can be found in the  care plan section) Acute Rehab OT Goals Patient Stated Goal: for hands to not be numb and tingling any more OT Goal Formulation: With patient Time For Goal Achievement: 11/01/22 Potential to Achieve Goals: Good         AM-PAC OT "6 Clicks" Daily Activity     Outcome Measure Help from another person eating meals?: A Little Help from another person taking care of personal grooming?: A Lot Help from another person toileting, which includes using toliet, bedpan, or urinal?: A Lot Help from another person bathing (including washing, rinsing, drying)?: A Lot Help from another person to put on and taking off regular upper body clothing?: A Lot Help from another person to put on and taking off regular lower body clothing?: A Lot 6 Click Score: 13   End of Session Equipment Utilized During Treatment: Gait belt;Rolling walker (2 wheels);Cervical collar Nurse Communication:  (needs an acute care PT consult to recommend follow up services)  Activity Tolerance: Patient tolerated treatment well Patient left: in chair;with call bell/phone within reach  OT Visit Diagnosis: Unsteadiness on feet (R26.81);Other abnormalities of  gait and mobility (R26.89);Muscle weakness (generalized) (M62.81);Pain Pain - part of body:  (neck)                Time: 2841-3244 OT Time Calculation (min): 45 min Charges:  OT General Charges $OT Visit: 1 Visit OT Evaluation $OT Eval Moderate Complexity: 1 Mod OT Treatments $Self Care/Home Management : 8-22 mins  Golden Circle, OTR/L [facility] [phone] Office [phone]    Almon Register 10/18/2022, 10:39 AM

## 2022-10-18 NOTE — Discharge Summary (Signed)
Physician Discharge Summary  Patient ID: Jane Andrews MRN: [id number] DOB/AGE: 86-Aug-1926 86 y.o.  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 The patient tolerated the procedure well and was taken to the recovery room and then to the floor in stable condition. The hospital course was routine. There were no complications. The wound remained clean dry and intact. Pt had appropriate neck soreness. No complaints of arm pain or new N/T/W. The patient remained afebrile with stable vital signs, and tolerated a regular diet. The patient continued to increase activities, and pain was well controlled with oral pain medications.   Consults: None  Significant Diagnostic Studies:  Results for orders placed or performed during the hospital encounter of 10/14/22  Surgical pcr screen   Specimen: Nasal Mucosa; Nasal Swab  Result Value Ref Range   MRSA, PCR NEGATIVE NEGATIVE   Staphylococcus aureus NEGATIVE NEGATIVE  Basic metabolic panel per protocol  Result Value Ref Range   Sodium 139 135 - 145 mmol/L   Potassium 4.7 3.5 - 5.1 mmol/L   Chloride 106 98 - 111 mmol/L   CO2 20 (L) 22 - 32 mmol/L   Glucose, Bld 131 (H) 70 - 99 mg/dL   BUN 27 (H) 8 - 23 mg/dL   Creatinine, Ser 1.67 (H) 0.44 - 1.00 mg/dL   Calcium 9.7 8.9 - 10.3 mg/dL   GFR, Estimated 28 (L) >60 mL/min   Anion gap 13 5 - 15  CBC per protocol  Result Value Ref Range   WBC 7.0 4.0 - 10.5 K/uL   RBC 3.58 (L) 3.87 - 5.11 MIL/uL   Hemoglobin 9.9 (L) 12.0 - 15.0 g/dL   HCT 31.5 (L) 36.0 - 46.0 %   MCV 88.0 80.0 - 100.0 fL   MCH 27.7 26.0 - 34.0 pg   MCHC 31.4 30.0 - 36.0 g/dL   RDW 15.7 (H) 11.5 - 15.5 %   Platelets 224 150 - 400 K/uL   nRBC 0.0 0.0 - 0.2 %    DG  Cervical Spine 2 or 3 views  Result Date: 10/17/2022 CLINICAL DATA:  Fluoroscopic assistance for cervical spinal fusion EXAM: CERVICAL SPINE - 2-3 VIEW COMPARISON:  06/01/2022 FINDINGS: Fluoroscopic images show anterior surgical fusion from C3-C5 levels. Fluoroscopy time 7.3 seconds. Radiation dose 0.61 mGy. IMPRESSION: Fluoroscopic assistance was provided for anterior surgical fusion at C3-C4 and C4-C5 levels. Electronically Signed   By: Elmer Picker M.D.   On: 10/17/2022 18:38   DG C-Arm 1-60 Min-No Report  Result Date: 10/17/2022 Fluoroscopy was utilized by the requesting physician.  No radiographic interpretation.   DG C-Arm 1-60 Min-No Report  Result Date: 10/17/2022 Fluoroscopy was utilized by the requesting physician.  No radiographic interpretation.   CT ABDOMEN PELVIS WO CONTRAST  Result Date: 10/16/2022 CLINICAL DATA:  88 year old female presents with history of bladder cancer for follow-up. * Tracking Code: BO * EXAM: CT ABDOMEN AND PELVIS WITHOUT CONTRAST TECHNIQUE: Multidetector CT imaging of the abdomen and pelvis was performed following the standard protocol without IV contrast. RADIATION DOSE REDUCTION: This exam was performed according to the departmental dose-optimization program which includes automated exposure control, adjustment of the mA and/or kV according to patient size and/or use of iterative reconstruction technique. COMPARISON:  January 24, 2022. FINDINGS: Lower chest:  Basilar atelectasis. No effusion. No consolidative changes. Hepatobiliary: Smooth hepatic contours. Post cholecystectomy. No gross biliary duct distension. No visible lesion on noncontrast imaging. Pancreas: Limited assessment due to lack of intravenous contrast. Fatty atrophy favoring the head of the pancreas. Spleen: Normal. Adrenals/Urinary Tract: Adrenal glands are normal. There is new fullness of LEFT ureter and intrarenal collecting systems since prior imaging with mild LEFT hydronephrosis. No  perinephric stranding. No gross calculus in the distal LEFT ureter. Transition does occur at the LEFT bladder base however. This areas obscured by streak artifact from hip arthroplasty changes. Large staghorn calculus in the interpolar and lower pole or RIGHT intrarenal collecting system similar to prior imaging. Enlarging intermediate density lesion arises from the lower pole the RIGHT kidney measuring 59 Hounsfield units at 1.5 cm previously 1.2 cm in May of 2023. No gross stranding adjacent urinary bladder. Stomach/Bowel: No acute gastrointestinal findings. Signs of prior bowel resection with ileocolonic anastomosis. Dilute contrast passes into the colon with patulous appearance of the proximal colon. Vascular/Lymphatic: Aortic atherosclerosis. No sign of aneurysm. Smooth contour of the IVC. There is no gastrohepatic or hepatoduodenal ligament lymphadenopathy. No retroperitoneal or mesenteric lymphadenopathy. No pelvic sidewall lymphadenopathy. Limited assessment of vascular structures due to lack of intravenous contrast. Reproductive: Cystic RIGHT adnexal lesion stable 4 cm greatest axial dimension only minimally changed since imaging from 2017. Other: No ascites.  No pneumoperitoneum. Musculoskeletal: RIGHT hip arthroplasty, incompletely imaged. Osteopenia. Spinal degenerative changes. IMPRESSION: 1. New mild LEFT hydroureteronephrosis in this patient with history of prior bladder neoplasm. Stricture related to therapy or lesion are considered as causes in this patient with history of cancer near the trigone. Cystoscopic assessment may be warranted if not recently performed. 2. Large staghorn calculus of the RIGHT kidney. 3. RIGHT renal lesion with intermediate density more likely hemorrhagic cyst based on simple appearance seen on prior imaging studies. Consider follow-up renal sonogram. 4. Cystic RIGHT adnexal lesion stable 4 cm greatest axial dimension only minimally changed since imaging from 2017.  Recommend follow-up US in 6-12 months. Note: This recommendation does not apply to premenarchal patients and to those with increased risk (genetic, family history, elevated tumor markers or other high-risk factors) of ovarian cancer. Reference: JACR 2020 Feb; 17(2):248-254 5. Aortic atherosclerosis. Aortic Atherosclerosis (ICD10-I70.0). Electronically Signed   By: Zetta Bills M.D.   On: 10/16/2022 14:05    Antibiotics:  Anti-infectives (From admission, onward)    Start     Dose/Rate Route Frequency Ordered Stop   10/17/22 1841  Uribel 118 MG CAPS 118 mg        1 capsule Oral 4 times daily PRN 10/17/22 1841     10/17/22 1830  ceFAZolin (ANCEF) IVPB 1 g/50 mL premix        1 g 100 mL/hr over 30 Minutes Intravenous Every 8 hours 10/17/22 1817 10/18/22 0254   10/17/22 1245  ceFAZolin (ANCEF) IVPB 2g/100 mL premix        2 g 200 mL/hr over 30 Minutes Intravenous On call to O.R. 10/17/22 1223 10/17/22 1530   10/17/22 1225  ceFAZolin (ANCEF) 2-4 GM/100ML-% IVPB       Note to Pharmacy: Leonides Sake: cabinet override      10/17/22 1225 10/17/22 1539       Discharge Exam: Blood pressure 124/64, pulse 84, temperature 98 F (36.7 C), temperature source Oral, resp. rate 18, height '5\' 1"'$  (1.549 m), weight 50.3 kg, SpO2 96 %. Neurologic: Grossly normal Ambulating and voiding well incision cdi   Discharge  Medications:   Allergies as of 10/18/2022       Reactions   Codeine Nausea And Vomiting        Medication List     STOP taking these medications    traMADol 50 MG tablet Commonly known as: Ultram       TAKE these medications    acetaminophen 500 MG tablet Commonly known as: TYLENOL Take 1 tablet (500 mg total) by mouth 4 (four) times daily -  with meals and at bedtime. What changed:  when to take this reasons to take this   ALPRAZolam 0.5 MG tablet Commonly known as: XANAX Take 1 mg by mouth at bedtime.   diclofenac Sodium 1 % Gel Commonly known as: VOLTAREN Apply 4  g topically 4 (four) times daily.   DULoxetine 30 MG capsule Commonly known as: CYMBALTA Take 30 mg by mouth every morning. Take with a 60 mg capsule for a total of 90 mg daily   DULoxetine 60 MG capsule Commonly known as: CYMBALTA Take 60 mg by mouth every morning. T   fosfomycin 3 g Pack Commonly known as: MONUROL 3 g by mouth every 10 days   gabapentin 100 MG capsule Commonly known as: NEURONTIN Take 2 capsules (200 mg total) by mouth 3 (three) times daily. What changed:  how much to take when to take this additional instructions   HYDROcodone-acetaminophen 5-325 MG tablet Commonly known as: NORCO/VICODIN Take 1 tablet by mouth every 4 (four) hours as needed for moderate pain ((score 4 to 6)).   levothyroxine 75 MCG tablet Commonly known as: SYNTHROID Take 1 tablet (75 mcg total) by mouth daily before breakfast.   loperamide 2 MG capsule Commonly known as: IMODIUM TAKE ONE CAPSULE ('2MG'$  TOTAL) BY MOUTH FOUR TIMES DAILY AS NEEDED FOR DIARRHEA OR LOOSE STOOLS   methocarbamol 750 MG tablet Commonly known as: ROBAXIN Take 1 tablet (750 mg total) by mouth 4 (four) times daily. What changed:  when to take this reasons to take this   metoprolol succinate 25 MG 24 hr tablet Commonly known as: TOPROL-XL Take 1 tablet (25 mg total) by mouth every morning.   mirabegron ER 50 MG Tb24 tablet Commonly known as: MYRBETRIQ Take 1 tablet (50 mg total) by mouth daily.   multivitamins with iron Tabs tablet Take 1 tablet by mouth every morning.   sodium bicarbonate 650 MG tablet Take 650 mg by mouth 2 (two) times daily.   Uribel 118 MG Caps Take 1 capsule (118 mg total) by mouth 4 (four) times daily as needed (pain with urination).        Disposition: home   Final Dx: acdf C3-4, C4-5  Discharge Instructions      Remove dressing in 72 hours   Complete by: As directed    Call MD for:  difficulty breathing, headache or visual disturbances   Complete by: As directed     Call MD for:  hives   Complete by: As directed    Call MD for:  persistant dizziness or light-headedness   Complete by: As directed    Call MD for:  persistant nausea and vomiting   Complete by: As directed    Call MD for:  redness, tenderness, or signs of infection (pain, swelling, redness, odor or green/yellow discharge around incision site)   Complete by: As directed    Call MD for:  severe uncontrolled pain   Complete by: As directed    Call MD for:  temperature >100.4  Complete by: As directed    Diet - low sodium heart healthy   Complete by: As directed    Driving Restrictions   Complete by: As directed    No driving for 2 weeks, no riding in the car for 1 week   Increase activity slowly   Complete by: As directed    Lifting restrictions   Complete by: As directed    No lifting more than 8 lbs          Signed: Ocie Cornfield Giancarlo Askren 10/18/2022, 7:12 AM

## 2022-10-19 ENCOUNTER — Emergency Department (HOSPITAL_COMMUNITY): Payer: Medicare Other

## 2022-10-19 ENCOUNTER — Encounter (HOSPITAL_COMMUNITY): Payer: Self-pay | Admitting: Emergency Medicine

## 2022-10-19 ENCOUNTER — Other Ambulatory Visit: Payer: Self-pay

## 2022-10-19 ENCOUNTER — Inpatient Hospital Stay (HOSPITAL_COMMUNITY)
Admission: EM | Admit: 2022-10-19 | Discharge: 2022-10-31 | DRG: 981 | Disposition: A | Discharge status: Skilled Nursing Facility | Payer: Medicare Other | Attending: Internal Medicine | Admitting: Internal Medicine

## 2022-10-19 DIAGNOSIS — I350 Nonrheumatic aortic (valve) stenosis: Secondary | ICD-10-CM | POA: Diagnosis present

## 2022-10-19 DIAGNOSIS — E43 Unspecified severe protein-calorie malnutrition: Secondary | ICD-10-CM | POA: Diagnosis not present

## 2022-10-19 DIAGNOSIS — Z885 Allergy status to narcotic agent status: Secondary | ICD-10-CM

## 2022-10-19 DIAGNOSIS — N390 Urinary tract infection, site not specified: Secondary | ICD-10-CM | POA: Diagnosis present

## 2022-10-19 DIAGNOSIS — N3281 Overactive bladder: Secondary | ICD-10-CM | POA: Insufficient documentation

## 2022-10-19 DIAGNOSIS — Z741 Need for assistance with personal care: Secondary | ICD-10-CM | POA: Diagnosis not present

## 2022-10-19 DIAGNOSIS — I480 Paroxysmal atrial fibrillation: Secondary | ICD-10-CM | POA: Diagnosis not present

## 2022-10-19 DIAGNOSIS — E46 Unspecified protein-calorie malnutrition: Secondary | ICD-10-CM | POA: Diagnosis not present

## 2022-10-19 DIAGNOSIS — E039 Hypothyroidism, unspecified: Secondary | ICD-10-CM | POA: Diagnosis not present

## 2022-10-19 DIAGNOSIS — J189 Pneumonia, unspecified organism: Secondary | ICD-10-CM | POA: Diagnosis present

## 2022-10-19 DIAGNOSIS — J9589 Other postprocedural complications and disorders of respiratory system, not elsewhere classified: Principal | ICD-10-CM | POA: Diagnosis present

## 2022-10-19 DIAGNOSIS — J939 Pneumothorax, unspecified: Secondary | ICD-10-CM | POA: Diagnosis not present

## 2022-10-19 DIAGNOSIS — I129 Hypertensive chronic kidney disease with stage 1 through stage 4 chronic kidney disease, or unspecified chronic kidney disease: Secondary | ICD-10-CM | POA: Diagnosis present

## 2022-10-19 DIAGNOSIS — F32A Depression, unspecified: Secondary | ICD-10-CM | POA: Diagnosis present

## 2022-10-19 DIAGNOSIS — R1310 Dysphagia, unspecified: Secondary | ICD-10-CM | POA: Diagnosis not present

## 2022-10-19 DIAGNOSIS — M6281 Muscle weakness (generalized): Secondary | ICD-10-CM | POA: Diagnosis not present

## 2022-10-19 DIAGNOSIS — I4819 Other persistent atrial fibrillation: Secondary | ICD-10-CM | POA: Diagnosis present

## 2022-10-19 DIAGNOSIS — Z853 Personal history of malignant neoplasm of breast: Secondary | ICD-10-CM

## 2022-10-19 DIAGNOSIS — Z90710 Acquired absence of both cervix and uterus: Secondary | ICD-10-CM

## 2022-10-19 DIAGNOSIS — I5032 Chronic diastolic (congestive) heart failure: Secondary | ICD-10-CM | POA: Diagnosis not present

## 2022-10-19 DIAGNOSIS — R262 Difficulty in walking, not elsewhere classified: Secondary | ICD-10-CM | POA: Diagnosis not present

## 2022-10-19 DIAGNOSIS — R042 Hemoptysis: Secondary | ICD-10-CM | POA: Diagnosis not present

## 2022-10-19 DIAGNOSIS — K219 Gastro-esophageal reflux disease without esophagitis: Secondary | ICD-10-CM | POA: Diagnosis present

## 2022-10-19 DIAGNOSIS — Z1152 Encounter for screening for COVID-19: Secondary | ICD-10-CM | POA: Diagnosis not present

## 2022-10-19 DIAGNOSIS — Z9181 History of falling: Secondary | ICD-10-CM | POA: Diagnosis not present

## 2022-10-19 DIAGNOSIS — E8809 Other disorders of plasma-protein metabolism, not elsewhere classified: Secondary | ICD-10-CM | POA: Diagnosis not present

## 2022-10-19 DIAGNOSIS — D509 Iron deficiency anemia, unspecified: Secondary | ICD-10-CM | POA: Diagnosis present

## 2022-10-19 DIAGNOSIS — W19XXXA Unspecified fall, initial encounter: Secondary | ICD-10-CM | POA: Diagnosis present

## 2022-10-19 DIAGNOSIS — J9601 Acute respiratory failure with hypoxia: Secondary | ICD-10-CM | POA: Insufficient documentation

## 2022-10-19 DIAGNOSIS — N184 Chronic kidney disease, stage 4 (severe): Secondary | ICD-10-CM | POA: Diagnosis present

## 2022-10-19 DIAGNOSIS — M810 Age-related osteoporosis without current pathological fracture: Secondary | ICD-10-CM | POA: Diagnosis present

## 2022-10-19 DIAGNOSIS — R2681 Unsteadiness on feet: Secondary | ICD-10-CM | POA: Diagnosis present

## 2022-10-19 DIAGNOSIS — Z66 Do not resuscitate: Secondary | ICD-10-CM | POA: Diagnosis not present

## 2022-10-19 DIAGNOSIS — Z981 Arthrodesis status: Secondary | ICD-10-CM

## 2022-10-19 DIAGNOSIS — F411 Generalized anxiety disorder: Secondary | ICD-10-CM | POA: Diagnosis not present

## 2022-10-19 DIAGNOSIS — F419 Anxiety disorder, unspecified: Secondary | ICD-10-CM | POA: Insufficient documentation

## 2022-10-19 DIAGNOSIS — D649 Anemia, unspecified: Secondary | ICD-10-CM | POA: Diagnosis present

## 2022-10-19 DIAGNOSIS — J690 Pneumonitis due to inhalation of food and vomit: Secondary | ICD-10-CM | POA: Diagnosis present

## 2022-10-19 DIAGNOSIS — R0902 Hypoxemia: Secondary | ICD-10-CM | POA: Diagnosis not present

## 2022-10-19 DIAGNOSIS — F13239 Sedative, hypnotic or anxiolytic dependence with withdrawal, unspecified: Secondary | ICD-10-CM | POA: Diagnosis present

## 2022-10-19 DIAGNOSIS — Z825 Family history of asthma and other chronic lower respiratory diseases: Secondary | ICD-10-CM

## 2022-10-19 DIAGNOSIS — Z431 Encounter for attention to gastrostomy: Secondary | ICD-10-CM | POA: Diagnosis not present

## 2022-10-19 DIAGNOSIS — K90829 Short bowel syndrome, unspecified: Secondary | ICD-10-CM | POA: Diagnosis not present

## 2022-10-19 DIAGNOSIS — R069 Unspecified abnormalities of breathing: Secondary | ICD-10-CM | POA: Diagnosis not present

## 2022-10-19 DIAGNOSIS — M4802 Spinal stenosis, cervical region: Secondary | ICD-10-CM | POA: Diagnosis not present

## 2022-10-19 DIAGNOSIS — R1314 Dysphagia, pharyngoesophageal phase: Secondary | ICD-10-CM | POA: Diagnosis not present

## 2022-10-19 DIAGNOSIS — J90 Pleural effusion, not elsewhere classified: Secondary | ICD-10-CM | POA: Diagnosis not present

## 2022-10-19 DIAGNOSIS — R000 Tachycardia, unspecified: Secondary | ICD-10-CM | POA: Diagnosis not present

## 2022-10-19 DIAGNOSIS — N1832 Chronic kidney disease, stage 3b: Secondary | ICD-10-CM | POA: Insufficient documentation

## 2022-10-19 DIAGNOSIS — Z87891 Personal history of nicotine dependence: Secondary | ICD-10-CM | POA: Diagnosis not present

## 2022-10-19 DIAGNOSIS — E538 Deficiency of other specified B group vitamins: Secondary | ICD-10-CM | POA: Diagnosis present

## 2022-10-19 DIAGNOSIS — I482 Chronic atrial fibrillation, unspecified: Secondary | ICD-10-CM | POA: Diagnosis not present

## 2022-10-19 DIAGNOSIS — I10 Essential (primary) hypertension: Secondary | ICD-10-CM | POA: Diagnosis not present

## 2022-10-19 DIAGNOSIS — Z96641 Presence of right artificial hip joint: Secondary | ICD-10-CM | POA: Diagnosis present

## 2022-10-19 DIAGNOSIS — M479 Spondylosis, unspecified: Secondary | ICD-10-CM | POA: Diagnosis present

## 2022-10-19 DIAGNOSIS — Z4682 Encounter for fitting and adjustment of non-vascular catheter: Secondary | ICD-10-CM | POA: Diagnosis not present

## 2022-10-19 DIAGNOSIS — Z87442 Personal history of urinary calculi: Secondary | ICD-10-CM

## 2022-10-19 DIAGNOSIS — R278 Other lack of coordination: Secondary | ICD-10-CM | POA: Diagnosis not present

## 2022-10-19 DIAGNOSIS — Z4689 Encounter for fitting and adjustment of other specified devices: Secondary | ICD-10-CM | POA: Diagnosis not present

## 2022-10-19 DIAGNOSIS — T380X5A Adverse effect of glucocorticoids and synthetic analogues, initial encounter: Secondary | ICD-10-CM | POA: Diagnosis not present

## 2022-10-19 DIAGNOSIS — G992 Myelopathy in diseases classified elsewhere: Secondary | ICD-10-CM | POA: Diagnosis not present

## 2022-10-19 DIAGNOSIS — R488 Other symbolic dysfunctions: Secondary | ICD-10-CM | POA: Diagnosis not present

## 2022-10-19 DIAGNOSIS — R770 Abnormality of albumin: Secondary | ICD-10-CM | POA: Diagnosis not present

## 2022-10-19 DIAGNOSIS — Y838 Other surgical procedures as the cause of abnormal reaction of the patient, or of later complication, without mention of misadventure at the time of the procedure: Secondary | ICD-10-CM | POA: Diagnosis present

## 2022-10-19 DIAGNOSIS — R739 Hyperglycemia, unspecified: Secondary | ICD-10-CM | POA: Diagnosis not present

## 2022-10-19 DIAGNOSIS — Z23 Encounter for immunization: Secondary | ICD-10-CM | POA: Diagnosis not present

## 2022-10-19 DIAGNOSIS — J392 Other diseases of pharynx: Secondary | ICD-10-CM | POA: Diagnosis present

## 2022-10-19 DIAGNOSIS — Z79899 Other long term (current) drug therapy: Secondary | ICD-10-CM

## 2022-10-19 DIAGNOSIS — Z79890 Hormone replacement therapy (postmenopausal): Secondary | ICD-10-CM

## 2022-10-19 DIAGNOSIS — F4322 Adjustment disorder with anxiety: Secondary | ICD-10-CM | POA: Diagnosis present

## 2022-10-19 DIAGNOSIS — K912 Postsurgical malabsorption, not elsewhere classified: Secondary | ICD-10-CM | POA: Diagnosis not present

## 2022-10-19 DIAGNOSIS — Z9011 Acquired absence of right breast and nipple: Secondary | ICD-10-CM

## 2022-10-19 DIAGNOSIS — Z452 Encounter for adjustment and management of vascular access device: Secondary | ICD-10-CM | POA: Diagnosis not present

## 2022-10-19 DIAGNOSIS — R0689 Other abnormalities of breathing: Secondary | ICD-10-CM | POA: Diagnosis not present

## 2022-10-19 DIAGNOSIS — Z87440 Personal history of urinary (tract) infections: Secondary | ICD-10-CM

## 2022-10-19 DIAGNOSIS — I4891 Unspecified atrial fibrillation: Secondary | ICD-10-CM | POA: Diagnosis present

## 2022-10-19 DIAGNOSIS — S14109A Unspecified injury at unspecified level of cervical spinal cord, initial encounter: Secondary | ICD-10-CM | POA: Diagnosis present

## 2022-10-19 DIAGNOSIS — I132 Hypertensive heart and chronic kidney disease with heart failure and with stage 5 chronic kidney disease, or end stage renal disease: Secondary | ICD-10-CM | POA: Diagnosis not present

## 2022-10-19 DIAGNOSIS — R1111 Vomiting without nausea: Secondary | ICD-10-CM | POA: Diagnosis not present

## 2022-10-19 LAB — CBC WITH DIFFERENTIAL/PLATELET
Abs Immature Granulocytes: 0.06 10*3/uL (ref 0.00–0.07)
Basophils Absolute: 0 10*3/uL (ref 0.0–0.1)
Basophils Relative: 0 %
Eosinophils Absolute: 0 10*3/uL (ref 0.0–0.5)
Eosinophils Relative: 0 %
HCT: 25.4 % — ABNORMAL LOW (ref 36.0–46.0)
Hemoglobin: 8 g/dL — ABNORMAL LOW (ref 12.0–15.0)
Immature Granulocytes: 1 %
Lymphocytes Relative: 3 %
Lymphs Abs: 0.3 10*3/uL — ABNORMAL LOW (ref 0.7–4.0)
MCH: 27.7 pg (ref 26.0–34.0)
MCHC: 31.5 g/dL (ref 30.0–36.0)
MCV: 87.9 fL (ref 80.0–100.0)
Monocytes Absolute: 0.5 10*3/uL (ref 0.1–1.0)
Monocytes Relative: 4 %
Neutro Abs: 10.6 10*3/uL — ABNORMAL HIGH (ref 1.7–7.7)
Neutrophils Relative %: 92 %
Platelets: 199 10*3/uL (ref 150–400)
RBC: 2.89 MIL/uL — ABNORMAL LOW (ref 3.87–5.11)
RDW: 15.9 % — ABNORMAL HIGH (ref 11.5–15.5)
WBC: 11.4 10*3/uL — ABNORMAL HIGH (ref 4.0–10.5)
nRBC: 0 % (ref 0.0–0.2)

## 2022-10-19 LAB — RESP PANEL BY RT-PCR (FLU A&B, COVID) ARPGX2
Influenza A by PCR: NEGATIVE
Influenza B by PCR: NEGATIVE
SARS Coronavirus 2 by RT PCR: NEGATIVE

## 2022-10-19 LAB — COMPREHENSIVE METABOLIC PANEL
ALT: 14 U/L (ref 0–44)
AST: 42 U/L — ABNORMAL HIGH (ref 15–41)
Albumin: 3 g/dL — ABNORMAL LOW (ref 3.5–5.0)
Alkaline Phosphatase: 117 U/L (ref 38–126)
Anion gap: 9 (ref 5–15)
BUN: 30 mg/dL — ABNORMAL HIGH (ref 8–23)
CO2: 20 mmol/L — ABNORMAL LOW (ref 22–32)
Calcium: 8.4 mg/dL — ABNORMAL LOW (ref 8.9–10.3)
Chloride: 107 mmol/L (ref 98–111)
Creatinine, Ser: 1.88 mg/dL — ABNORMAL HIGH (ref 0.44–1.00)
GFR, Estimated: 24 mL/min — ABNORMAL LOW (ref >60)
Glucose, Bld: 116 mg/dL — ABNORMAL HIGH (ref 70–99)
Potassium: 4.3 mmol/L (ref 3.5–5.1)
Sodium: 136 mmol/L (ref 135–145)
Total Bilirubin: 0.8 mg/dL (ref 0.3–1.2)
Total Protein: 6.5 g/dL (ref 6.5–8.1)

## 2022-10-19 LAB — PROTIME-INR
INR: 1 (ref 0.8–1.2)
Prothrombin Time: 13.5 seconds (ref 11.4–15.2)

## 2022-10-19 LAB — LACTIC ACID, PLASMA: Lactic Acid, Venous: 1.8 mmol/L (ref 0.5–1.9)

## 2022-10-19 LAB — APTT: aPTT: 24 seconds (ref 24–36)

## 2022-10-19 MED ORDER — SODIUM CHLORIDE 0.9 % IV BOLUS
1000.0000 mL | Freq: Once | INTRAVENOUS | Status: AC
  Administered 2022-10-19: 1000 mL via INTRAVENOUS

## 2022-10-19 MED ORDER — SODIUM CHLORIDE 0.9 % IV BOLUS
500.0000 mL | Freq: Once | INTRAVENOUS | Status: AC
  Administered 2022-10-19: 500 mL via INTRAVENOUS

## 2022-10-19 MED ORDER — SODIUM CHLORIDE 0.9 % IV SOLN
2.0000 g | INTRAVENOUS | Status: DC
  Administered 2022-10-19: 2 g via INTRAVENOUS
  Filled 2022-10-19: qty 20

## 2022-10-19 MED ORDER — SODIUM CHLORIDE 0.9 % IV SOLN
500.0000 mg | INTRAVENOUS | Status: DC
  Administered 2022-10-19 – 2022-10-20 (×2): 500 mg via INTRAVENOUS
  Filled 2022-10-19 (×2): qty 5

## 2022-10-19 NOTE — ED Provider Notes (Signed)
[facility] EMERGENCY DEPARTMENT Provider Note   CSN: [id number] Arrival date & time: 10/19/22  2052     History {Add pertinent medical, surgical, social history, OB history to HPI:1} Chief Complaint  Patient presents with   Shortness of Breath    Jane Andrews is a 88 y.o. female.  Patient has a history of atrial fibs and recent neck surgery.  She complains of shortness of breath   Shortness of Breath      Home Medications Prior to Admission medications   Medication Sig Start Date End Date Taking? Authorizing Provider  acetaminophen (TYLENOL) 500 MG tablet Take 1 tablet (500 mg total) by mouth 4 (four) times daily -  with meals and at bedtime. Patient taking differently: Take 500 mg by mouth every 6 (six) hours as needed for moderate pain. 06/06/22   Love, Ivan Anchors, PA-C  ALPRAZolam Duanne Moron) 0.5 MG tablet Take 1 mg by mouth at bedtime. 12/19/21   [provider]  diclofenac Sodium (VOLTAREN) 1 % GEL Apply 4 g topically 4 (four) times daily. Patient not taking: Reported on 10/10/2022 06/06/22   Love, Ivan Anchors, PA-C  DULoxetine (CYMBALTA) 30 MG capsule Take 30 mg by mouth every morning. Take with a 60 mg capsule for a total of 90 mg daily    [provider]  DULoxetine (CYMBALTA) 60 MG capsule Take 60 mg by mouth every morning. T 03/30/22   [provider]  fosfomycin (MONUROL) 3 g PACK 3 g by mouth every 10 days 09/09/22   Stoneking, Reece Leader., MD  gabapentin (NEURONTIN) 100 MG capsule Take 2 capsules (200 mg total) by mouth 3 (three) times daily. Patient taking differently: Take 100-200 mg by mouth See admin instructions. Take 100 mg by mouth in the morning and afternoon and take 200 mg in the evening 07/10/22   Jennye Boroughs, MD  HYDROcodone-acetaminophen (NORCO/VICODIN) 5-325 MG tablet Take 1 tablet by mouth every 4 (four) hours as needed for moderate pain ((score 4 to 6)). 10/18/22   Meyran, Ocie Cornfield, NP  levothyroxine (SYNTHROID) 75 MCG  tablet Take 1 tablet (75 mcg total) by mouth daily before breakfast. 07/09/20   Gerlene Fee, NP  loperamide (IMODIUM) 2 MG capsule TAKE ONE CAPSULE ('2MG'$  TOTAL) BY MOUTH FOUR TIMES DAILY AS NEEDED FOR DIARRHEA OR LOOSE STOOLS 09/17/22   Stoneking, Reece Leader., MD  Meth-Hyo-M Barnett Hatter Phos-Ph Sal (URIBEL) 118 MG CAPS Take 1 capsule (118 mg total) by mouth 4 (four) times daily as needed (pain with urination). 04/23/22   Stoneking, Reece Leader., MD  methocarbamol (ROBAXIN) 750 MG tablet Take 1 tablet (750 mg total) by mouth 4 (four) times daily. 10/18/22   Meyran, Ocie Cornfield, NP  metoprolol succinate (TOPROL-XL) 25 MG 24 hr tablet Take 1 tablet (25 mg total) by mouth every morning. 06/06/22   Love, Ivan Anchors, PA-C  mirabegron ER (MYRBETRIQ) 50 MG TB24 tablet Take 1 tablet (50 mg total) by mouth daily. 08/05/22   Stoneking, Reece Leader., MD  Multiple Vitamins-Iron (MULTIVITAMINS WITH IRON) TABS tablet Take 1 tablet by mouth every morning. 06/19/20   [provider]  sodium bicarbonate 650 MG tablet Take 650 mg by mouth 2 (two) times daily. 09/20/20   [provider]      Allergies    Codeine    Review of Systems   Review of Systems  Respiratory:  Positive for shortness of breath.     Physical Exam Updated Vital Signs BP 115/63   Pulse Marland Kitchen)  101   Temp 98.6 F (37 C) (Oral)   Resp (!) 21   Ht '5\' 1"'$  (1.549 m)   Wt 50.8 kg   SpO2 96%   BMI 21.16 kg/m  Physical Exam  ED Results / Procedures / Treatments   Labs (all labs ordered are listed, but only abnormal results are displayed) Labs Reviewed  COMPREHENSIVE METABOLIC PANEL - Abnormal; Notable for the following components:      Result Value   CO2 20 (*)    Glucose, Bld 116 (*)    BUN 30 (*)    Creatinine, Ser 1.88 (*)    Calcium 8.4 (*)    Albumin 3.0 (*)    AST 42 (*)    GFR, Estimated 24 (*)    All other components within normal limits  CBC WITH DIFFERENTIAL/PLATELET - Abnormal; Notable for the following components:    WBC 11.4 (*)    RBC 2.89 (*)    Hemoglobin 8.0 (*)    HCT 25.4 (*)    RDW 15.9 (*)    Neutro Abs 10.6 (*)    Lymphs Abs 0.3 (*)    All other components within normal limits  CULTURE, BLOOD (ROUTINE X 2)  CULTURE, BLOOD (ROUTINE X 2)  RESP PANEL BY RT-PCR (FLU A&B, COVID) [id number]  URINE CULTURE  LACTIC ACID, PLASMA  APTT  PROTIME-INR  LACTIC ACID, PLASMA  URINALYSIS, ROUTINE W REFLEX MICROSCOPIC    EKG None  Radiology DG Chest Port 1 View  Result Date: 10/19/2022 CLINICAL DATA:  Questionable sepsis. EXAM: PORTABLE CHEST 1 VIEW COMPARISON:  PA Lat 05/16/2022 FINDINGS: There is mild cardiomegaly without evidence of CHF. Mild aortic tortuosity and calcification with stable mediastinum. Since the prior study there has developed patchy airspace disease in both lung bases consistent with bilateral pneumonia or aspiration. Minimal pleural effusions have also developed. The mid and upper lung fields are clear. There are degenerative changes of the thoracic spine and old right axillary surgical clips. IMPRESSION: 1. New patchy airspace disease in both lung bases consistent with bilateral pneumonia or aspiration. Minimal pleural effusions. Follow-up study recommended after treatment to ensure clearing. 2. Mild cardiomegaly without evidence of CHF. 3. Aortic atherosclerosis. Electronically Signed   By: Telford Nab M.D.   On: 10/19/2022 21:35    Procedures Procedures  {Document cardiac monitor, telemetry assessment procedure when appropriate:1}  Medications Ordered in ED Medications  cefTRIAXone (ROCEPHIN) 2 g in sodium chloride 0.9 % 100 mL IVPB (0 g Intravenous Stopped 10/19/22 2241)  azithromycin (ZITHROMAX) 500 mg in sodium chloride 0.9 % 250 mL IVPB (500 mg Intravenous New Bag/Given 10/19/22 2242)  sodium chloride 0.9 % bolus 500 mL (0 mLs Intravenous Stopped 10/19/22 2241)  sodium chloride 0.9 % bolus 1,000 mL (1,000 mLs Intravenous New Bag/Given 10/19/22 2241)    ED Course/ Medical  Decision Making/ A&P  CRITICAL CARE Performed by: Milton Ferguson Total critical care time: 45 minutes Critical care time was exclusive of separately billable procedures and treating other patients. Critical care was necessary to treat or prevent imminent or life-threatening deterioration. Critical care was time spent personally by me on the following activities: development of treatment plan with patient and/or surrogate as well as nursing, discussions with consultants, evaluation of patient's response to treatment, examination of patient, obtaining history from patient or surrogate, ordering and performing treatments and interventions, ordering and review of laboratory studies, ordering and review of radiographic studies, pulse oximetry and re-evaluation of patient's condition.  Medical Decision Making Amount and/or Complexity of Data Reviewed Labs: ordered. Radiology: ordered.  Risk Decision regarding hospitalization.   Patient with bilateral pneumonia.  Mild hypoxia.  She will be admitted to medicine  {Document critical care time when appropriate:1} {Document review of labs and clinical decision tools ie heart score, Chads2Vasc2 etc:1}  {Document your independent review of radiology images, and any outside records:1} {Document your discussion with family members, caretakers, and with consultants:1} {Document social determinants of health affecting pt's care:1} {Document your decision making why or why not admission, treatments were needed:1} Final Clinical Impression(s) / ED Diagnoses Final diagnoses:  Community acquired pneumonia, unspecified laterality    Rx / DC Orders ED Discharge Orders     None

## 2022-10-19 NOTE — H&P (Signed)
History and Physical    Patient: Jane Andrews JQB:[id number] DOB: [birth year] DOA: 10/19/2022 DOS: the patient was seen and examined on 10/20/2022 PCP: Redmond School, MD  Patient coming from: Home  Chief Complaint:  Chief Complaint  Patient presents with   Shortness of Breath   HPI: Jane Andrews is a 88 y.o. 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  Patient was laying in bed with neck brace and most of the history was obtained from ED physician and niece at the bedside.  Per report, patient presented with cough and congestion a few days ago, her physician was contacted and recommended giving Mucinex, patient's aide called the niece this afternoon that patient was vomiting, niece activated EMS and on arrival of EMS team, she was noted to be hypoxic with an O2 sat of 84% on room air, NRB at 15 L was provided with improvement in oxygenation to 98%.  Patient was tachycardic at rate of 136, she was febrile with a temperature of 102F (orally).  Code sepsis was activated en route to the ED.  ED Course:  In the emergency department, temperature was 98.6 F, respiration was 25/min, HR 138 bpm, BP was 110/65, O2 sat was 94% on supplemental oxygen at 2 LPM.  Work-up in the ED showed normocytic anemia with WBC of 11.4, BMP showed sodium 136, potassium 4.3, chloride 107, bicarb 20, glucose 116, BUN 30, creatinine 1.88 (baseline creatinine at 1.5-1.7).  Albumin 3.0, AST 42, ALT 14, ALP 117.  Lactic acid was 1.8 > 1.4.  Blood culture pending, influenza A, B, SARS coronavirus 2 was negative. Chest x-ray showed new  patchy airspace disease in both lung bases consistent with bilateral pneumonia or aspiration. Minimal pleural effusions. He was treated  with IV ceftriaxone and azithromycin, IV hydration was provided.  Hospitalist was asked to admit patient for further evaluation and management.  Review of Systems: Review of systems as noted in the HPI. All other systems reviewed and are negative.   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 due to gastritis or adverse drug effect (nonsteroidals, small bowel ulceration with Fosamax); caused by Pepto-Bismol during one Emergency Department evaluation   Past Surgical History:  Procedure Laterality Date   ABDOMINAL HYSTERECTOMY     emergency s/p delivery   ABDOMINAL HYSTERECTOMY  1960   massive gynecologic bleeding   BOWEL RESECTION     Resulting short gut syndrome   CHOLECYSTECTOMY N/A 06/25/2018   Procedure: LAPAROSCOPIC  CHOLECYSTECTOMY WITH INTRAOPERATIVE CHOLANGIOGRAM;  Surgeon: Armandina Gemma, MD;  Location: [facility];  Service: General;  Laterality: N/A;   COLONOSCOPY W/ POLYPECTOMY  2005   Lipoma; diverticulosis   COLONOSCOPY WITH ESOPHAGOGASTRODUODENOSCOPY (EGD)  11/22/2012   [facility]   CYSTOSCOPY W/ RETROGRADES Bilateral 04/16/2022   Procedure: CYSTOSCOPY;  Surgeon: Primus Bravo., MD;  Location: [facility];  Service: Urology;  Laterality: Bilateral;   ESOPHAGOGASTRODUODENOSCOPY (EGD) WITH PROPOFOL N/A 04/04/2022    Procedure: ESOPHAGOGASTRODUODENOSCOPY (EGD) WITH PROPOFOL;  Surgeon: Rogene Houston, MD;  Location: [facility];  Service: Endoscopy;  Laterality: N/A;  210   HIP ARTHROPLASTY Right 06/15/2020   Procedure: ANTERIOR ARTHROPLASTY BIPOLAR HIP (HEMIARTHROPLASTY);  Surgeon: Mcarthur Rossetti, MD;  Location: [facility];  Service: Orthopedics;  Laterality: Right;   LAPAROSCOPIC LYSIS OF ADHESIONS  1965   s/p adhesions   MASTECTOMY  right breast   MASTECTOMY     Carcinoma of the breast; right   TRANSURETHRAL RESECTION OF BLADDER TUMOR N/A 04/16/2022   Procedure: TRANSURETHRAL RESECTION OF BLADDER TUMOR (TURBT);  Surgeon: Primus Bravo., MD;  Location: [facility];  Service: Urology;  Laterality: N/A;   UPPER GASTROINTESTINAL ENDOSCOPY      Social History:  reports that she has quit smoking. Her smoking use included cigarettes. She has a 30.00 pack-year smoking history. She has never been exposed to tobacco smoke. She has never used smokeless tobacco. She reports that she does not drink alcohol and does not use drugs.   Allergies  Allergen Reactions   Codeine Nausea And Vomiting    Family History  Problem Relation Age of Onset   Anuerysm Father    Rheum arthritis Sister    Healthy Sister    COPD Sister    Healthy Brother    Cancer Other    Colon cancer Neg Hx      Prior to Admission medications   Medication Sig Start Date End Date Taking? Authorizing Provider  acetaminophen (TYLENOL) 500 MG tablet Take 1 tablet (500 mg total) by mouth 4 (four) times daily -  with meals and at bedtime. Patient taking differently: Take 500 mg by mouth every 6 (six) hours as needed for moderate pain. 06/06/22   Love, Ivan Anchors, PA-C  ALPRAZolam Duanne Moron) 0.5 MG tablet Take 1 mg by mouth at bedtime. 12/19/21   [provider]  diclofenac Sodium (VOLTAREN) 1 % GEL Apply 4 g topically 4 (four) times daily. Patient not taking: Reported on 10/10/2022 06/06/22   Love, Ivan Anchors, PA-C  DULoxetine  (CYMBALTA) 30 MG capsule Take 30 mg by mouth every morning. Take with a 60 mg capsule for a total of 90 mg daily    [provider]  DULoxetine (CYMBALTA) 60 MG capsule Take 60 mg by mouth every morning. T 03/30/22   [provider]  fosfomycin (MONUROL) 3 g PACK 3 g by mouth every 10 days 09/09/22   Stoneking, Reece Leader., MD  gabapentin (NEURONTIN) 100 MG capsule Take 2 capsules (200 mg total) by mouth 3 (three) times daily. Patient taking differently: Take 100-200 mg by mouth See admin instructions. Take 100 mg by mouth in the morning and afternoon and take 200 mg in the evening 07/10/22   Jennye Boroughs, MD  HYDROcodone-acetaminophen (NORCO/VICODIN) 5-325 MG tablet Take 1 tablet by mouth every 4 (four) hours as needed for moderate pain ((score 4 to 6)). 10/18/22   Meyran, Ocie Cornfield, NP  levothyroxine (SYNTHROID) 75 MCG tablet Take 1 tablet (75 mcg total) by  mouth daily before breakfast. 07/09/20   Gerlene Fee, NP  loperamide (IMODIUM) 2 MG capsule TAKE ONE CAPSULE ('2MG'$  TOTAL) BY MOUTH FOUR TIMES DAILY AS NEEDED FOR DIARRHEA OR LOOSE STOOLS 09/17/22   Stoneking, Reece Leader., MD  Meth-Hyo-M Barnett Hatter Phos-Ph Sal (URIBEL) 118 MG CAPS Take 1 capsule (118 mg total) by mouth 4 (four) times daily as needed (pain with urination). 04/23/22   Stoneking, Reece Leader., MD  methocarbamol (ROBAXIN) 750 MG tablet Take 1 tablet (750 mg total) by mouth 4 (four) times daily. 10/18/22   Meyran, Ocie Cornfield, NP  metoprolol succinate (TOPROL-XL) 25 MG 24 hr tablet Take 1 tablet (25 mg total) by mouth every morning. 06/06/22   Love, Ivan Anchors, PA-C  mirabegron ER (MYRBETRIQ) 50 MG TB24 tablet Take 1 tablet (50 mg total) by mouth daily. 08/05/22   Stoneking, Reece Leader., MD  Multiple Vitamins-Iron (MULTIVITAMINS WITH IRON) TABS tablet Take 1 tablet by mouth every morning. 06/19/20   [provider]  sodium bicarbonate 650 MG tablet Take 650 mg by mouth 2 (two) times daily. 09/20/20   [provider]    Physical Exam: BP 101/60   Pulse 98   Temp 98.6 F (37 C) (Oral)   Resp 18   Ht '5\' 1"'$  (1.549 m)   Wt 50.8 kg   SpO2 97%   BMI 21.16 kg/m   General: 88 y.o. year-old female ill appearing, but in no acute distress.  Alert and oriented x3. HEENT: NCAT, EOMI, neck brace noted Neck: Supple, trachea medial Cardiovascular: Tachycardia.  Regular rate and rhythm with no rubs or gallops.  No thyromegaly or JVD noted.  No lower extremity edema. 2/4 pulses in all 4 extremities. Respiratory: Bilateral rhonchi in the lower lobes on auscultation. No wheezes. Abdomen: Soft, nontender nondistended with normal bowel sounds x4 quadrants. Muskuloskeletal: No cyanosis, clubbing or edema noted bilaterally Neuro: CN II-XII intact, sensation, reflexes intact Skin: No ulcerative lesions noted or rashes Psychiatry: Mood is appropriate for condition and setting          Labs on Admission:  Basic Metabolic Panel: Recent Labs  Lab 10/14/22 1328 10/19/22 2130  NA 139 136  K 4.7 4.3  CL 106 107  CO2 20* 20*  GLUCOSE 131* 116*  BUN 27* 30*  CREATININE 1.67* 1.88*  CALCIUM 9.7 8.4*   Liver Function Tests: Recent Labs  Lab 10/19/22 2130  AST 42*  ALT 14  ALKPHOS 117  BILITOT 0.8  PROT 6.5  ALBUMIN 3.0*   No results for input(s): "LIPASE", "AMYLASE" in the last 168 hours. No results for input(s): "AMMONIA" in the last 168 hours. CBC: Recent Labs  Lab 10/14/22 1328 10/19/22 2130  WBC 7.0 11.4*  NEUTROABS  --  10.6*  HGB 9.9* 8.0*  HCT 31.5* 25.4*  MCV 88.0 87.9  PLT 224 199   Cardiac Enzymes: No results for input(s): "CKTOTAL", "CKMB", "CKMBINDEX", "TROPONINI" in the last 168 hours.  BNP (last 3 results) No results for input(s): "BNP" in the last 8760 hours.  ProBNP (last 3 results) No results for input(s): "PROBNP" in the last 8760 hours.  CBG: No results for input(s): "GLUCAP" in the last 168 hours.  Radiological Exams on Admission: DG Chest Port 1  View  Result Date: 10/19/2022 CLINICAL DATA:  Questionable sepsis. EXAM: PORTABLE CHEST 1 VIEW COMPARISON:  PA Lat 05/16/2022 FINDINGS: There is mild cardiomegaly without evidence of CHF. Mild aortic tortuosity and calcification with stable mediastinum. Since the prior study there has  developed patchy airspace disease in both lung bases consistent with bilateral pneumonia or aspiration. Minimal pleural effusions have also developed. The mid and upper lung fields are clear. There are degenerative changes of the thoracic spine and old right axillary surgical clips. IMPRESSION: 1. New patchy airspace disease in both lung bases consistent with bilateral pneumonia or aspiration. Minimal pleural effusions. Follow-up study recommended after treatment to ensure clearing. 2. Mild cardiomegaly without evidence of CHF. 3. Aortic atherosclerosis. Electronically Signed   By: Telford Nab M.D.   On: 10/19/2022 21:35    EKG: I independently viewed the EKG done and my findings are as followed: EKG was not done in the ED.  Assessment/Plan Present on Admission:  CAP (community acquired pneumonia)  Normocytic anemia  Acquired hypothyroidism  Atrial fibrillation (HCC)  CKD (chronic kidney disease) stage 4, GFR 15-29 ml/min (HCC)  Principal Problem:   CAP (community acquired pneumonia) Active Problems:   CKD (chronic kidney disease) stage 4, GFR 15-29 ml/min (HCC)   Acquired hypothyroidism   Atrial fibrillation (HCC)   Normocytic anemia   Acute respiratory failure with hypoxia (HCC)   Overactive bladder   Hypoalbuminemia due to protein-calorie malnutrition (HCC)   Anxiety   Depression   Cervical stenosis of spinal canal  Acute respiratory failure with hypoxia secondary to possible Aspiration Pneumonia Chest x-ray was suggestive of bilateral pneumonia or aspiration Patient was started on ceftriaxone and azithromycin, we shall continue with Unasyn and azithromycin at this time with plan to  de-escalate/discontinue based on blood culture, sputum culture, urine Legionella, strep pneumo and procalcitonin Continue IV hydration Continue Tylenol as needed Continue Mucinex, incentive spirometry, flutter valve   Normocytic anemia Hemoglobin at 8.0, baseline appears to be at 9.5-10.4 No sign of blood loss.  This may be due to patient's history of CKD Continue to monitor H/H with morning labs  Hypoalbuminemia possibly secondary to moderate protein calorie malnutrition Albumin 3.0, protein supplement will be provided  CKD stage IV creatinine 1.88 (baseline creatinine at 1.5-1.7) Renally adjust medications, avoid nephrotoxic agents/dehydration/hypotension  Atrial fibrillation Metoprolol will be temporarily held at this time due to soft BP  Acquired hypothyroidism Continue Synthroid  Overactive bladder Continue Myrbetriq  Anxiety and depression Continue Xanax and Cymbalta  Cervical stenosis s/p anterior cervical discectomy and fusion (10/17/2022) Continue Robaxin and Neurontin Continue neck brace    DVT prophylaxis: Lovenox  Code Status: Full code  Family Communication: Niece at bedside  Consults: None  Severity of Illness: The appropriate patient status for this patient is INPATIENT. Inpatient status is judged to be reasonable and necessary in order to provide the required intensity of service to ensure the patient's safety. The patient's presenting symptoms, physical exam findings, and initial radiographic and laboratory data in the context of their chronic comorbidities is felt to place them at high risk for further clinical deterioration. Furthermore, it is not anticipated that the patient will be medically stable for discharge from the hospital within 2 midnights of admission.   * I certify that at the point of admission it is my clinical judgment that the patient will require inpatient hospital care spanning beyond 2 midnights from the point of admission due to  high intensity of service, high risk for further deterioration and high frequency of surveillance required.*  Author: Bernadette Hoit, DO 10/20/2022 2:06 AM  For on call review [email or web address].

## 2022-10-19 NOTE — ED Triage Notes (Addendum)
Pt bib EMS after family called stating pt was having difficulty breathing. Upon their arrival, EMS states O2 sats were 84% on R/A and they placed pt on NRB 15L and sats increased to 98%. EMS also states pt was tachycardic @ 136 and had a fever of 102 orally. Pt with recent sx to neck on Friday. EMS brought in as "Code Sepsis".

## 2022-10-19 NOTE — Sepsis Progress Note (Signed)
Elink following code sepsis °

## 2022-10-20 ENCOUNTER — Encounter (HOSPITAL_COMMUNITY): Payer: Self-pay | Admitting: Neurosurgery

## 2022-10-20 ENCOUNTER — Other Ambulatory Visit: Payer: Self-pay

## 2022-10-20 ENCOUNTER — Inpatient Hospital Stay: Payer: Self-pay

## 2022-10-20 DIAGNOSIS — F32A Depression, unspecified: Secondary | ICD-10-CM | POA: Insufficient documentation

## 2022-10-20 DIAGNOSIS — J9601 Acute respiratory failure with hypoxia: Secondary | ICD-10-CM | POA: Insufficient documentation

## 2022-10-20 DIAGNOSIS — M4802 Spinal stenosis, cervical region: Secondary | ICD-10-CM | POA: Insufficient documentation

## 2022-10-20 DIAGNOSIS — E46 Unspecified protein-calorie malnutrition: Secondary | ICD-10-CM | POA: Insufficient documentation

## 2022-10-20 DIAGNOSIS — N3281 Overactive bladder: Secondary | ICD-10-CM | POA: Insufficient documentation

## 2022-10-20 DIAGNOSIS — J189 Pneumonia, unspecified organism: Secondary | ICD-10-CM | POA: Diagnosis not present

## 2022-10-20 DIAGNOSIS — F419 Anxiety disorder, unspecified: Secondary | ICD-10-CM | POA: Insufficient documentation

## 2022-10-20 DIAGNOSIS — E8809 Other disorders of plasma-protein metabolism, not elsewhere classified: Secondary | ICD-10-CM | POA: Insufficient documentation

## 2022-10-20 LAB — PROCALCITONIN: Procalcitonin: 17.1 ng/mL

## 2022-10-20 LAB — RETICULOCYTES
Immature Retic Fract: 23.6 % — ABNORMAL HIGH (ref 2.3–15.9)
RBC.: 2.33 MIL/uL — ABNORMAL LOW (ref 3.87–5.11)
Retic Count, Absolute: 32.4 10*3/uL (ref 19.0–186.0)
Retic Ct Pct: 1.4 % (ref 0.4–3.1)

## 2022-10-20 LAB — URINALYSIS, ROUTINE W REFLEX MICROSCOPIC
Bacteria, UA: NONE SEEN
Bilirubin Urine: NEGATIVE
Glucose, UA: NEGATIVE mg/dL
Ketones, ur: NEGATIVE mg/dL
Nitrite: NEGATIVE
Protein, ur: 30 mg/dL — AB
Specific Gravity, Urine: 1.014 (ref 1.005–1.030)
pH: 5 (ref 5.0–8.0)

## 2022-10-20 LAB — FOLATE: Folate: 23.1 ng/mL (ref >5.9)

## 2022-10-20 LAB — FERRITIN: Ferritin: 271 ng/mL (ref 11–307)

## 2022-10-20 LAB — LACTIC ACID, PLASMA: Lactic Acid, Venous: 1.4 mmol/L (ref 0.5–1.9)

## 2022-10-20 LAB — VITAMIN B12: Vitamin B-12: 149 pg/mL — ABNORMAL LOW (ref 180–914)

## 2022-10-20 LAB — IRON AND TIBC
Iron: 7 ug/dL — ABNORMAL LOW (ref 28–170)
Saturation Ratios: 3 % — ABNORMAL LOW (ref 10.4–31.8)
TIBC: 215 ug/dL — ABNORMAL LOW (ref 250–450)
UIBC: 208 ug/dL

## 2022-10-20 LAB — PHOSPHORUS: Phosphorus: 3.1 mg/dL (ref 2.5–4.6)

## 2022-10-20 LAB — MAGNESIUM: Magnesium: 1.5 mg/dL — ABNORMAL LOW (ref 1.7–2.4)

## 2022-10-20 MED ORDER — METHOCARBAMOL 500 MG PO TABS
750.0000 mg | ORAL_TABLET | Freq: Four times a day (QID) | ORAL | Status: DC | PRN
  Administered 2022-10-29: 750 mg via ORAL
  Filled 2022-10-20: qty 2

## 2022-10-20 MED ORDER — AMIODARONE HCL IN DEXTROSE 360-4.14 MG/200ML-% IV SOLN
60.0000 mg/h | INTRAVENOUS | Status: AC
  Administered 2022-10-20: 60 mg/h via INTRAVENOUS
  Filled 2022-10-20 (×3): qty 200

## 2022-10-20 MED ORDER — HYDROMORPHONE HCL 1 MG/ML IJ SOLN
0.5000 mg | INTRAMUSCULAR | Status: DC | PRN
  Administered 2022-10-20 – 2022-10-22 (×6): 0.5 mg via INTRAVENOUS
  Filled 2022-10-20 (×7): qty 0.5

## 2022-10-20 MED ORDER — GABAPENTIN 100 MG PO CAPS
100.0000 mg | ORAL_CAPSULE | Freq: Two times a day (BID) | ORAL | Status: DC
  Administered 2022-10-20: 100 mg via ORAL
  Filled 2022-10-20 (×8): qty 1

## 2022-10-20 MED ORDER — LEVOTHYROXINE SODIUM 75 MCG PO TABS
75.0000 ug | ORAL_TABLET | Freq: Every day | ORAL | Status: DC
  Administered 2022-10-27: 75 ug via ORAL
  Filled 2022-10-20 (×3): qty 1

## 2022-10-20 MED ORDER — ONDANSETRON HCL 4 MG/2ML IJ SOLN
4.0000 mg | Freq: Four times a day (QID) | INTRAMUSCULAR | Status: DC | PRN
  Administered 2022-10-27 – 2022-10-28 (×3): 4 mg via INTRAVENOUS
  Filled 2022-10-20 (×3): qty 2

## 2022-10-20 MED ORDER — ONDANSETRON HCL 4 MG PO TABS: 4.0000 mg | ORAL_TABLET | Freq: Four times a day (QID) | ORAL | Status: DC | PRN

## 2022-10-20 MED ORDER — SODIUM CHLORIDE 0.9 % IV SOLN
3.0000 g | Freq: Two times a day (BID) | INTRAVENOUS | Status: DC
  Administered 2022-10-20 – 2022-10-23 (×7): 3 g via INTRAVENOUS
  Filled 2022-10-20 (×9): qty 8

## 2022-10-20 MED ORDER — SODIUM CHLORIDE 0.9% FLUSH: 10.0000 mL | INTRAVENOUS | Status: DC | PRN

## 2022-10-20 MED ORDER — ENSURE ENLIVE PO LIQD
237.0000 mL | Freq: Two times a day (BID) | ORAL | Status: DC
  Administered 2022-10-20 – 2022-10-21 (×2): 237 mL via ORAL
  Filled 2022-10-20 (×6): qty 237

## 2022-10-20 MED ORDER — ACETAMINOPHEN 325 MG PO TABS: 650.0000 mg | ORAL_TABLET | Freq: Four times a day (QID) | ORAL | Status: DC | PRN

## 2022-10-20 MED ORDER — SODIUM CHLORIDE 0.9 % IV SOLN: INTRAVENOUS | Status: DC

## 2022-10-20 MED ORDER — SODIUM CHLORIDE 0.9 % IV SOLN: 3.0000 g | Freq: Four times a day (QID) | INTRAVENOUS | Status: DC

## 2022-10-20 MED ORDER — LACTATED RINGERS IV SOLN: INTRAVENOUS | Status: DC

## 2022-10-20 MED ORDER — AMIODARONE HCL IN DEXTROSE 360-4.14 MG/200ML-% IV SOLN
30.0000 mg/h | INTRAVENOUS | Status: DC
  Administered 2022-10-21: 30 mg/h via INTRAVENOUS
  Filled 2022-10-20 (×2): qty 200

## 2022-10-20 MED ORDER — DM-GUAIFENESIN ER 30-600 MG PO TB12
1.0000 | ORAL_TABLET | Freq: Two times a day (BID) | ORAL | Status: DC
  Administered 2022-10-20 – 2022-10-21 (×3): 1 via ORAL
  Filled 2022-10-20 (×3): qty 1

## 2022-10-20 MED ORDER — MIRABEGRON ER 25 MG PO TB24
50.0000 mg | ORAL_TABLET | Freq: Every day | ORAL | Status: DC
  Administered 2022-10-20 – 2022-10-31 (×2): 50 mg via ORAL
  Filled 2022-10-20 (×7): qty 2

## 2022-10-20 MED ORDER — SODIUM CHLORIDE 0.9 % IV BOLUS
500.0000 mL | Freq: Once | INTRAVENOUS | Status: AC
  Administered 2022-10-20: 500 mL via INTRAVENOUS

## 2022-10-20 MED ORDER — SODIUM CHLORIDE 0.9% FLUSH
10.0000 mL | Freq: Two times a day (BID) | INTRAVENOUS | Status: DC
  Administered 2022-10-20 – 2022-10-21 (×3): 10 mL
  Administered 2022-10-22: 20 mL
  Administered 2022-10-22 – 2022-10-31 (×16): 10 mL

## 2022-10-20 MED ORDER — GABAPENTIN 100 MG PO CAPS
200.0000 mg | ORAL_CAPSULE | Freq: Every day | ORAL | Status: DC
  Administered 2022-10-26 – 2022-10-29 (×2): 200 mg via ORAL
  Filled 2022-10-20 (×4): qty 2

## 2022-10-20 MED ORDER — ENOXAPARIN SODIUM 30 MG/0.3ML IJ SOSY
30.0000 mg | PREFILLED_SYRINGE | INTRAMUSCULAR | Status: DC
  Administered 2022-10-20: 30 mg via SUBCUTANEOUS
  Filled 2022-10-20: qty 0.3

## 2022-10-20 MED ORDER — DULOXETINE HCL 60 MG PO CPEP
60.0000 mg | ORAL_CAPSULE | Freq: Every morning | ORAL | Status: DC
  Administered 2022-10-20 – 2022-10-31 (×2): 60 mg via ORAL
  Filled 2022-10-20 (×7): qty 1

## 2022-10-20 MED ORDER — CHLORHEXIDINE GLUCONATE CLOTH 2 % EX PADS
6.0000 | MEDICATED_PAD | Freq: Every day | CUTANEOUS | Status: DC
  Administered 2022-10-20 – 2022-10-31 (×12): 6 via TOPICAL

## 2022-10-20 MED ORDER — ALPRAZOLAM 1 MG PO TABS: 1.0000 mg | ORAL_TABLET | Freq: Every day | ORAL | Status: DC

## 2022-10-20 MED ORDER — ACETAMINOPHEN 650 MG RE SUPP: 650.0000 mg | Freq: Four times a day (QID) | RECTAL | Status: DC | PRN

## 2022-10-20 MED ORDER — MAGNESIUM SULFATE 2 GM/50ML IV SOLN
2.0000 g | Freq: Once | INTRAVENOUS | Status: AC
  Administered 2022-10-20: 2 g via INTRAVENOUS
  Filled 2022-10-20: qty 50

## 2022-10-20 MED FILL — Thrombin For Soln 5000 Unit: CUTANEOUS | Qty: 2 | Status: AC

## 2022-10-20 NOTE — Progress Notes (Signed)
Patient had three unsuccessful iv attempts to left arm, patient has right arm restricted from mastectomy and lymph node removal. MD made aware of unsuccessful attempts at IV. Asked MD if he wanted to place a line. MD placed order for PICC line. Awaiting for RN from [facility] to place PICC line. Family at bedside updated and patient updated with care.

## 2022-10-20 NOTE — Progress Notes (Addendum)
RN called due to patient going into A-fib with RVR, she has a history of atrial fibrillation and rate control medication was held on admission due to soft BP.  She had hypomagnesemia with magnesium at 1.5, this was replenished, but patient continued to be in A-fib.  Patient's BP is soft at 102/74, patient was transferred to stepdown unit for amiodarone drip.  Continue therapeutic Lovenox for A-fib.  Continue to monitor

## 2022-10-20 NOTE — Progress Notes (Signed)
Peripherally Inserted Central Catheter Placement  The IV Nurse has discussed with the patient and/or persons authorized to consent for the patient, the purpose of this procedure and the potential benefits and risks involved with this procedure.  The benefits include less needle sticks, lab draws from the catheter, and the patient may be discharged home with the catheter. Risks include, but not limited to, infection, bleeding, blood clot (thrombus formation), and puncture of an artery; nerve damage and irregular heartbeat and possibility to perform a PICC exchange if needed/ordered by physician.  Alternatives to this procedure were also discussed.  Bard Power PICC patient education guide, fact sheet on infection prevention and patient information card has been provided to patient /or left at bedside.    PICC Placement Documentation  PICC Double Lumen 10/20/22 Left Brachial 38 cm 0 cm (Active)  Indication for Insertion or Continuance of Line Poor Vasculature-patient has had multiple peripheral attempts or PIVs lasting less than 24 hours 10/20/22 1910  Exposed Catheter (cm) 0 cm 10/20/22 1910  Site Assessment Clean;Dry;Intact;Bruised 10/20/22 1910  Lumen #1 Status Saline locked;Blood return noted 10/20/22 1910  Lumen #2 Status Saline locked;Blood return noted 10/20/22 1910  Dressing Type Transparent;Securing device 10/20/22 1910  Dressing Status Antimicrobial disc in place;Clean, Dry, Intact 10/20/22 1910  Safety Lock Not Applicable 35/32/99 2426  Line Care Connections checked and tightened 10/20/22 1910  Line Adjustment (NICU/IV Team Only) No 10/20/22 1910  Dressing Intervention New dressing 10/20/22 1910  Dressing Change Due 10/27/22 10/20/22 1910       Rosalio Macadamia Chenice 10/20/2022, 7:11 PM

## 2022-10-20 NOTE — Progress Notes (Signed)
PHARMACY NOTE:  ANTIMICROBIAL RENAL DOSAGE ADJUSTMENT  Current antimicrobial regimen includes a mismatch between antimicrobial dosage and estimated renal function.  As per policy approved by the Pharmacy & Therapeutics and Medical Executive Committees, the antimicrobial dosage will be adjusted accordingly.  Current antimicrobial dosage:  Unasyn 3gm IV q6h  Indication: CAP  Renal Function:  Estimated Creatinine Clearance: 13.5 mL/min (A) (by C-G formula based on SCr of 1.88 mg/dL (H)).   Antimicrobial dosage has been changed to:  Unasyn 3gm IV q12h    Thank you for allowing pharmacy to be a part of this patient's care.  Everette Rank, [facility] 10/20/2022 1:58 AM

## 2022-10-20 NOTE — Progress Notes (Signed)
   Subjective:  Patient is feeling well with improvement in cough/congestion. Patient endorses dysuria and states she gets UTI's frequently and feels as if she has one. No acute events overnight.  Objective:  Vital signs in last 24 hours: Vitals:   10/20/22 0830 10/20/22 0834 10/20/22 0859 10/20/22 0923  BP: 99/76  (!) 110/59 (!) 116/59  Pulse: 97  93 95  Resp: (!) '21  17 16  '$ Temp: 98.4 F (36.9 C) 98.4 F (36.9 C) 98.4 F (36.9 C) 98.6 F (37 C)  TempSrc: Oral Oral Oral   SpO2: 96%  96% 98%  Weight:      Height:       Weight change:   Intake/Output Summary (Last 24 hours) at 10/20/2022 1008 Last data filed at 10/20/2022 0104 Gross per 24 hour  Intake 1600 ml  Output --  Net 1600 ml   Physical Examination  General: lying in bed with neck brace, in no acute distress Head: normocephalic, atraumatic Cardiovascular: regular rate and rhythm, systolic murmur, without rubs, or gallops, no LEE Respiratory: normal respiratory effort, bilateral basilar rhonchi, expiratory wheeze most notably in RUL Abdominal: normoactive bowel sounds, no tenderness to palpation Skin: warm, dry Central Nervous System: alert and oriented x 3 Psychiatric: Normal mood and affect     Assessment/Plan:  Principal Problem:   CAP (community acquired pneumonia) Active Problems:   CKD (chronic kidney disease) stage 4, GFR 15-29 ml/min (HCC)   Acquired hypothyroidism   Atrial fibrillation (HCC)   Normocytic anemia   Acute respiratory failure with hypoxia (HCC)   Overactive bladder   Hypoalbuminemia due to protein-calorie malnutrition (HCC)   Anxiety   Depression   Cervical stenosis of spinal canal   Acute respiratory failure with hypoxia secondary to possible Aspiration Pneumonia Chest x-ray was suggestive of bilateral pneumonia or aspiration.  Patient was started on ceftriaxone and azithromycin, we shall continue with Unasyn and azithromycin at this time with plan to de-escalate/discontinue  based on blood culture (no growth < 12 hours), sputum culture, urine Legionella, strep pneumo. Procalcitonin is 17.10. Per nursing, this morning patient started to cough while swallowing pills. Speech therapy evaluation ordered. -Continue antibiotics -Continue IV hydration -Continue Tylenol PRN -Continue Mucinex, incentive spirometry, flutter valve  -Follow-up with culture, an urinary antigens   Normocytic anemia Hemoglobin at 8.0, baseline appears to be at 9.5-10.4 No sign of blood loss. This may be due to patient's history of CKD. Anemia panel ordered. -Monitor CBC   Hypoalbuminemia possibly secondary to moderate protein calorie malnutrition Albumin 3.0, protein supplement will be provided -Monitor CMP   CKD Stage IV Creatinine 1.88 (baseline creatinine at 1.5-1.7) Renally adjust medications, avoid nephrotoxic agents/dehydration/hypotension -Trend BMP   Atrial fibrillation Metoprolol will be temporarily held at this time due to soft BP   Acquired hypothyroidism Continue Synthroid   Overactive bladder Continue Myrbetriq   Anxiety and depression Continue Xanax and Cymbalta   Cervical stenosis s/p anterior cervical discectomy and fusion (10/17/2022) Continue Robaxin and Neurontin Continue neck brace  UTI Patient has symptoms consistent with previous UTI's. UA with moderate Hgb and Leukocytes. Urine culture is pending. Patient is afebrile with WBC count of 11.4 as of last night. Patient receiving Zosyn and Azithromycin. -Follow-up with urine culture and CBC   LOS: 1 day   Stanford Breed, Medical Student 10/20/2022, 10:08 AM

## 2022-10-20 NOTE — TOC Initial Note (Signed)
Transition of Care [facility]) - Initial/Assessment Note    Patient Details  Name: Jane Andrews MRN: [id number] Date of Birth: August 01, 1925  Transition of Care [facility]) CM/SW Contact:    Salome Arnt, [place] Phone Number: 10/20/2022, 10:53 AM  Clinical Narrative:  Pt admitted with community acquired pneumonia. Assessment completed due to high risk readmission score. LCSW met with pt's niece [name], pt's brother, and pt's sister-in-law at bedside. Pt asleep at time of visit. [name] reports pt has 24 hour caregivers at home. She requires assist with all ADLs and ambulates with a walker. Discussed d/c planning and family is agreeable to SNF if recommended. Pt has been to [facility] in the past. PT evaluation pending. TOC will continue to follow.                     Barriers to Discharge: Continued Medical Work up   Patient Goals and CMS Choice Patient states their goals for this hospitalization and ongoing recovery are:: unsure   Choice offered to / list presented to : [place] / Guardian  Expected Discharge Plan and Services   In-house Referral: Clinical Social Work     Living arrangements for the past 2 months: Single Family Home                                      Prior Living Arrangements/Services Living arrangements for the past 2 months: Single Family Home Lives with:: Other (Comment) (around the clock care) Patient language and need for interpreter reviewed:: Yes Do you feel safe going back to the place where you live?: Yes      Need for Family Participation in Patient Care: Yes (Comment) Care giver support system in place?: Yes (comment) Current home services: DME (walker, wheelchair, tub seat, 3N1, cane) Criminal Activity/Legal Involvement Pertinent to Current Situation/Hospitalization: No - Comment as needed  Activities of Daily Living Home Assistive Devices/Equipment: Walker (specify type), Cane (specify quad or straight), Wheelchair, Eyeglasses, Grab bars  around toilet, Grab bars in shower, Hand-held shower hose, Shower chair with back ADL Screening (condition at time of admission) Patient's cognitive ability adequate to safely complete daily activities?: Yes Is the patient deaf or have difficulty hearing?: No Does the patient have difficulty seeing, even when wearing glasses/contacts?: No Does the patient have difficulty concentrating, remembering, or making decisions?: No Patient able to express need for assistance with ADLs?: Yes Does the patient have difficulty dressing or bathing?: Yes Independently performs ADLs?: No Communication: Independent Dressing (OT): Needs assistance Is this a change from baseline?: Pre-admission baseline Grooming: Needs assistance Is this a change from baseline?: Pre-admission baseline Feeding: Needs assistance Is this a change from baseline?: Change from baseline, expected to last <3 days Bathing: Needs assistance Is this a change from baseline?: Pre-admission baseline Toileting: Needs assistance Is this a change from baseline?: Pre-admission baseline In/Out Bed: Needs assistance Is this a change from baseline?: Pre-admission baseline Walks in Home: Needs assistance Is this a change from baseline?: Pre-admission baseline Does the patient have difficulty walking or climbing stairs?: Yes Weakness of Legs: Both Weakness of Arms/Hands: Both  Permission Sought/Granted                  Emotional Assessment       Orientation: : Oriented to Self, Oriented to Place, Oriented to  Time, Oriented to Situation Alcohol / Substance Use: Not Applicable Psych Involvement: No (  comment)  Admission diagnosis:  CAP (community acquired pneumonia) [J18.9] Community acquired pneumonia, unspecified laterality [J18.9] Patient Active Problem List   Diagnosis Date Noted   Acute respiratory failure with hypoxia ([place]) 10/20/2022   Overactive bladder 10/20/2022   Hypoalbuminemia due to protein-calorie malnutrition  ([place]) 10/20/2022   Anxiety 10/20/2022   Depression 10/20/2022   Cervical stenosis of spinal canal 10/20/2022   CAP (community acquired pneumonia) 10/19/2022   Cervical myelopathy ([place]) 10/17/2022   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 ([place]) 05/23/2022   Central cord syndrome of cervical spinal cord ([place]) 05/19/2022   SAH (subarachnoid hemorrhage) ([place]) 05/13/2022   TBI (traumatic brain injury) ([place]) 05/13/2022   Bladder mass 04/16/2022   Hyperkalemia 04/09/2022   Mixed conductive and sensorineural hearing loss of both ears 03/03/2022   Normocytic anemia 02/11/2022   SIRS (systemic inflammatory response syndrome) ([place]) 02/10/2022   Bilateral external ear infections 02/10/2022   Sinusitis 02/10/2022   Nephrolithiasis 01/07/2022   Vulvar irritation 12/20/2021   Burning with urination 12/20/2021   Symptoms of urinary tract infection 12/20/2021   Diarrhea 03/05/2021   Acute kidney failure, unspecified ([place]) 08/15/2020   Scoliosis 06/26/2020   Thrombocytopenia ([place]) 06/22/2020   Aspiration pneumonia ([place]) 06/22/2020   Chronic constipation 06/21/2020   Post-menopausal osteoporosis 06/21/2020   Essential tremor 06/21/2020   Vitamin B 12 deficiency 06/21/2020   Major depression, recurrent, chronic ([place]) 06/21/2020   Status post total replacement of right hip    AKI (acute kidney injury) ([place])    Postoperative anemia due to acute blood loss    Closed right hip fracture (HCC) 06/14/2020   IDA (iron deficiency anemia) 02/28/2020   Atrial fibrillation with RVR ([place]) 12/25/2018   GERD (gastroesophageal reflux disease) 12/25/2018   Chronic cholecystitis s/p lap cholecystectomy 06/25/2018 06/18/2018   Vagal reaction 04/12/2016   Abdominal pain 09/19/2014   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   Ecchymoses, spontaneous 05/31/2013   Acquired hypothyroidism    Atrial fibrillation (HCC)    Breast carcinoma (HCC)    Malabsorption    CKD (chronic kidney disease) stage  4, GFR 15-29 ml/min ([place]) 10/21/2012   Anemia, normocytic normochromic 07/06/2012   Chronic diarrhea 02/10/2012   Hypertension 02/10/2012   PCP:  Redmond School, MD Pharmacy:   [place], [place] [address] Phone: [phone] Fax: [phone]  [facility] [place] [facility] [address] [address] Phone: [phone] Fax: [phone]     Social Determinants of Health (SDOH) Interventions    Readmission Risk Interventions    06/15/2020   10:21 AM  Readmission Risk Prevention Plan  Medication Screening Complete  Transportation Screening Complete

## 2022-10-20 NOTE — Progress Notes (Signed)
Patients family member stated patient was coughing when eating lunch. Speech order in place awaiting for speech to evaluate swallowing. MD aware of coughing with swallowing. Patient also complaint of back pain patient has prn PO pain medicine, asked MD if we could have something IV since patient is coughing when swallowing . MD stated to keep patient NPO for now. And MD will place order for IV PRN pain medicine.

## 2022-10-21 ENCOUNTER — Inpatient Hospital Stay (HOSPITAL_COMMUNITY): Payer: Medicare Other

## 2022-10-21 DIAGNOSIS — I480 Paroxysmal atrial fibrillation: Secondary | ICD-10-CM | POA: Diagnosis not present

## 2022-10-21 DIAGNOSIS — Z4682 Encounter for fitting and adjustment of non-vascular catheter: Secondary | ICD-10-CM | POA: Diagnosis not present

## 2022-10-21 DIAGNOSIS — Z452 Encounter for adjustment and management of vascular access device: Secondary | ICD-10-CM | POA: Diagnosis not present

## 2022-10-21 DIAGNOSIS — J939 Pneumothorax, unspecified: Secondary | ICD-10-CM | POA: Diagnosis not present

## 2022-10-21 LAB — COMPREHENSIVE METABOLIC PANEL
ALT: 9 U/L (ref 0–44)
AST: 18 U/L (ref 15–41)
Albumin: 2.5 g/dL — ABNORMAL LOW (ref 3.5–5.0)
Alkaline Phosphatase: 82 U/L (ref 38–126)
Anion gap: 9 (ref 5–15)
BUN: 24 mg/dL — ABNORMAL HIGH (ref 8–23)
CO2: 19 mmol/L — ABNORMAL LOW (ref 22–32)
Calcium: 8 mg/dL — ABNORMAL LOW (ref 8.9–10.3)
Chloride: 112 mmol/L — ABNORMAL HIGH (ref 98–111)
Creatinine, Ser: 1.65 mg/dL — ABNORMAL HIGH (ref 0.44–1.00)
GFR, Estimated: 28 mL/min — ABNORMAL LOW (ref >60)
Glucose, Bld: 93 mg/dL (ref 70–99)
Potassium: 3.8 mmol/L (ref 3.5–5.1)
Sodium: 140 mmol/L (ref 135–145)
Total Bilirubin: 0.9 mg/dL (ref 0.3–1.2)
Total Protein: 5.8 g/dL — ABNORMAL LOW (ref 6.5–8.1)

## 2022-10-21 LAB — CBC
HCT: 23 % — ABNORMAL LOW (ref 36.0–46.0)
Hemoglobin: 7 g/dL — ABNORMAL LOW (ref 12.0–15.0)
MCH: 27.6 pg (ref 26.0–34.0)
MCHC: 30.4 g/dL (ref 30.0–36.0)
MCV: 90.6 fL (ref 80.0–100.0)
Platelets: 172 10*3/uL (ref 150–400)
RBC: 2.54 MIL/uL — ABNORMAL LOW (ref 3.87–5.11)
RDW: 16.3 % — ABNORMAL HIGH (ref 11.5–15.5)
WBC: 10.6 10*3/uL — ABNORMAL HIGH (ref 4.0–10.5)
nRBC: 0 % (ref 0.0–0.2)

## 2022-10-21 LAB — GLUCOSE, CAPILLARY: Glucose-Capillary: 89 mg/dL (ref 70–99)

## 2022-10-21 LAB — MAGNESIUM: Magnesium: 2.5 mg/dL — ABNORMAL HIGH (ref 1.7–2.4)

## 2022-10-21 LAB — URINE CULTURE: Culture: 6000 — AB

## 2022-10-21 LAB — TSH: TSH: 1.506 u[IU]/mL (ref 0.350–4.500)

## 2022-10-21 LAB — HEMOGLOBIN AND HEMATOCRIT, BLOOD
HCT: 21.8 % — ABNORMAL LOW (ref 36.0–46.0)
Hemoglobin: 6.7 g/dL — CL (ref 12.0–15.0)

## 2022-10-21 LAB — PREPARE RBC (CROSSMATCH)

## 2022-10-21 MED ORDER — SODIUM CHLORIDE 0.9 % IV SOLN: INTRAVENOUS | Status: DC | PRN

## 2022-10-21 MED ORDER — ENOXAPARIN SODIUM 30 MG/0.3ML IJ SOSY
30.0000 mg | PREFILLED_SYRINGE | Freq: Once | INTRAMUSCULAR | Status: AC
  Administered 2022-10-21: 30 mg via SUBCUTANEOUS
  Filled 2022-10-21: qty 0.3

## 2022-10-21 MED ORDER — METOPROLOL TARTRATE 5 MG/5ML IV SOLN
5.0000 mg | Freq: Four times a day (QID) | INTRAVENOUS | Status: DC
  Administered 2022-10-21 – 2022-10-23 (×10): 5 mg via INTRAVENOUS
  Filled 2022-10-21 (×10): qty 5

## 2022-10-21 MED ORDER — CYANOCOBALAMIN 1000 MCG/ML IJ SOLN
1000.0000 ug | Freq: Once | INTRAMUSCULAR | Status: AC
  Administered 2022-10-21: 1000 ug via INTRAMUSCULAR
  Filled 2022-10-21: qty 1

## 2022-10-21 MED ORDER — DEXTROSE IN LACTATED RINGERS 5 % IV SOLN: INTRAVENOUS | Status: DC

## 2022-10-21 MED ORDER — SODIUM CHLORIDE 0.9 % IV SOLN
250.0000 mg | Freq: Every day | INTRAVENOUS | Status: AC
  Administered 2022-10-21 – 2022-10-22 (×2): 250 mg via INTRAVENOUS
  Filled 2022-10-21 (×2): qty 250

## 2022-10-21 MED ORDER — SODIUM CHLORIDE 0.9 % IV SOLN
100.0000 mg | Freq: Two times a day (BID) | INTRAVENOUS | Status: AC
  Administered 2022-10-21 – 2022-10-23 (×5): 100 mg via INTRAVENOUS
  Filled 2022-10-21 (×5): qty 100

## 2022-10-21 MED ORDER — FLUCONAZOLE 100MG IVPB
100.0000 mg | INTRAVENOUS | Status: DC
  Administered 2022-10-22: 100 mg via INTRAVENOUS
  Filled 2022-10-21 (×2): qty 50

## 2022-10-21 MED ORDER — ENOXAPARIN SODIUM 60 MG/0.6ML IJ SOSY: 50.0000 mg | PREFILLED_SYRINGE | INTRAMUSCULAR | Status: DC

## 2022-10-21 MED ORDER — FLUCONAZOLE 100MG IVPB
100.0000 mg | INTRAVENOUS | Status: DC
  Filled 2022-10-21: qty 50

## 2022-10-21 MED ORDER — SODIUM CHLORIDE 0.9% IV SOLUTION: Freq: Once | INTRAVENOUS | Status: AC

## 2022-10-21 NOTE — Evaluation (Addendum)
Clinical/Bedside Swallow Evaluation Patient Details  Name: Jane Andrews MRN: [id number] Date of Birth: 08/11/1925  Today's Date: 10/21/2022 Time: SLP Start Time (ACUTE ONLY): 0930 SLP Stop Time (ACUTE ONLY): 1000 SLP Time Calculation (min) (ACUTE ONLY): 30 min  Past Medical History:  Past Medical History:  Diagnosis Date   Abdominal adhesions 1994   Allergic rhinitis    Anemia    Anxiety and depression    Aortic stenosis    Arthritis    Atrial fibrillation ([place]) 10/2012   Associated with severe anemia and esophageal pill impaction   Breast carcinoma ([place])    Right mastectomy   Cholelithiasis    Essential hypertension    Gastroesophageal reflux disease    Hiatal hernia   History of blood transfusion    History of kidney stones    Hypothyroidism    Low back pain    Malabsorption    Short gut syndrome following small bowel resection surgery x2   Nephrolithiasis 2004   Painless hematuria   Short gut syndrome    Bowel resection , 2004   Upper GI bleed 2004   Multiple episodes of melena-? due to gastritis or adverse drug effect (nonsteroidals, small bowel ulceration with Fosamax); caused by Pepto-Bismol during one Emergency Department evaluation   Past Surgical History:  Past Surgical History:  Procedure Laterality Date   ABDOMINAL HYSTERECTOMY     emergency s/p delivery   ABDOMINAL HYSTERECTOMY  1960   massive gynecologic bleeding   ANTERIOR CERVICAL DECOMP/DISCECTOMY FUSION N/A 10/17/2022   Procedure: Anterior Cervical Discectomy and Fusion Cervical Three-Four/Cervical Four-Five;  Surgeon: Earnie Larsson, MD;  Location: [facility];  Service: Neurosurgery;  Laterality: N/A;  3C   BOWEL RESECTION     Resulting short gut syndrome   CHOLECYSTECTOMY N/A 06/25/2018   Procedure: LAPAROSCOPIC CHOLECYSTECTOMY WITH INTRAOPERATIVE CHOLANGIOGRAM;  Surgeon: Armandina Gemma, MD;  Location: [facility];  Service: General;  Laterality: N/A;   COLONOSCOPY W/ POLYPECTOMY  2005   Lipoma;  diverticulosis   COLONOSCOPY WITH ESOPHAGOGASTRODUODENOSCOPY (EGD)  11/22/2012   [place]   CYSTOSCOPY W/ RETROGRADES Bilateral 04/16/2022   Procedure: CYSTOSCOPY;  Surgeon: Primus Bravo., MD;  Location: [facility];  Service: Urology;  Laterality: Bilateral;   ESOPHAGOGASTRODUODENOSCOPY (EGD) WITH PROPOFOL N/A 04/04/2022   Procedure: ESOPHAGOGASTRODUODENOSCOPY (EGD) WITH PROPOFOL;  Surgeon: Rogene Houston, MD;  [facility];  Service: Endoscopy;  Laterality: N/A;  210   HIP ARTHROPLASTY Right 06/15/2020   Procedure: ANTERIOR ARTHROPLASTY BIPOLAR HIP (HEMIARTHROPLASTY);  Surgeon: Mcarthur Rossetti, MD;  Location: [facility];  Service: Orthopedics;  Laterality: Right;   LAPAROSCOPIC LYSIS OF ADHESIONS  1965   s/p adhesions   MASTECTOMY  right breast   MASTECTOMY     Carcinoma of the breast; right   TRANSURETHRAL RESECTION OF BLADDER TUMOR N/A 04/16/2022   Procedure: TRANSURETHRAL RESECTION OF BLADDER TUMOR (TURBT);  Surgeon: Primus Bravo., MD;  Location: [facility];  Service: Urology;  Laterality: N/A;   UPPER GASTROINTESTINAL ENDOSCOPY     HPI:  Jane Andrews is a 88 y.o. 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  Patient was lying in bed with neck brace and most of the history was obtained from ED physician and niece at the bedside.  Per report, patient presented with cough and  congestion a few days ago, her physician was contacted and recommended giving Mucinex, patient's aide called the niece this afternoon that patient was vomiting, niece activated EMS and on arrival of EMS team, she was noted to be hypoxic with an O2 sat of 84% on room air, NRB at 15 L was provided with improvement in oxygenation to 98%.  Patient  was tachycardic at rate of 136, she was febrile with a temperature of 102F (orally).  Code sepsis was activated en route to the ED.;CXR indicated on 10/19/22 potential Aspiration PNA or B PNA. BSE generated to assess swallow function.   Assessment / Plan / Recommendation  Clinical Impression  Pt seen for clinical swallowing evaluation with pharyngoesophageal dysphagia observed c/b intermittent globus sensation, audible swallow, and suspected delay in the initiation of the swallow with all consistencies.  Immediate cough noted with ice chips and tsp of thin liquids and subsequent delayed throat clearing with sips of nectar thickened liquids.  Tsp amounts of nectar thick liquids did not elicit any s/sx of aspiration, but silent aspiration cannot be r/o due to recent sx, deconditioning, current slight neck hyperextension (d/t brace) as a result of cervical discectomy, and potential dysphagia prior to sx as reported by nieces (ie: throat clearing).  Recommend continuing NPO status with objective assessment completion to r/o aspiration and identify safest diet consistency/modifications to utilize during PO intake.  ST will f/u in acute setting for dysphagia tx/diet tolerance/education.  Thank you for this consult. SLP Visit Diagnosis: Dysphagia, pharyngoesophageal phase (R13.14)    Aspiration Risk  Mild aspiration risk;Moderate aspiration risk    Diet Recommendation   NPO (tsp amounts of nectar allowed until objective assessment completed or necessary meds with puree/whole)  Medication Administration: Via alternative means (or whole in puree if necessary)    Other  Recommendations Oral Care Recommendations: Oral care QID    Recommendations for follow up therapy are one component of a multi-disciplinary discharge planning process, led by the attending physician.  Recommendations may be updated based on patient status, additional functional criteria and insurance authorization.  Follow up Recommendations     TBD    Assistance Recommended at Discharge Frequent or constant Supervision/Assistance  Functional Status Assessment Patient has had a recent decline in their functional status and demonstrates the ability to make significant improvements in function in a reasonable and predictable amount of time.  Frequency and Duration min 2x/week  1 week       Prognosis Prognosis for Safe Diet Advancement: Good      Swallow Study   General Date of Onset: 10/21/22 HPI: Jane Andrews is a 88 y.o. 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  Patient was laying in bed with neck brace and most of the history was obtained from ED physician and niece at the bedside.  Per report, patient presented with cough and congestion a few days ago, her physician was contacted and recommended giving Mucinex, patient's aide called the niece this afternoon that patient was vomiting, niece activated EMS and on arrival of EMS team, she was noted to be hypoxic with an O2 sat of 84% on room air, NRB at 15 L was provided with improvement in oxygenation to 98%.  Patient was tachycardic at rate of 136, she was febrile with a temperature of 102F (  orally).  Code sepsis was activated en route to the ED.;CXR indicated on 10/19/22 potential Aspiration PNA or B PNA. Type of Study: Bedside Swallow Evaluation Previous Swallow Assessment: n/a Diet Prior to this Study: NPO Temperature Spikes Noted: Yes Respiratory Status: Nasal cannula History of Recent Intubation: Yes Length of Intubations (days): 1 days (for surgery (c-spine)) Behavior/Cognition: Alert;Cooperative Oral Cavity Assessment: Within Functional Limits Oral Care Completed by SLP: Recent completion by staff Oral Cavity -  Dentition: Adequate natural dentition Vision: Functional for self-feeding Self-Feeding Abilities: Able to feed self;Needs assist Patient Positioning: Upright in bed;Other (comment) (head min hyperextended d/t neck brace) Baseline Vocal Quality: Hoarse;Low vocal intensity;Other (comment) (min hoarse) Volitional Cough: Strong Volitional Swallow: Able to elicit    Oral/Motor/Sensory Function Overall Oral Motor/Sensory Function: Within functional limits   Ice Chips Ice chips: Impaired Presentation: Spoon Pharyngeal Phase Impairments: Cough - Immediate   Thin Liquid Thin Liquid: Impaired Presentation: Spoon Pharyngeal  Phase Impairments: Suspected delayed Swallow;Cough - Immediate    Nectar Thick Nectar Thick Liquid: Impaired Presentation: Cup;Spoon Pharyngeal Phase Impairments: Suspected delayed Swallow;Multiple swallows;Throat Clearing - Delayed Other Comments: only with sips, not tsp   Honey Thick Honey Thick Liquid: Not tested   Puree Puree: Impaired Presentation: Spoon Pharyngeal Phase Impairments: Multiple swallows;Suspected delayed Swallow;Other (comments) Other Comments: globus sensation   Solid     Solid: Not tested      Elvina Sidle, M.S., CCC-SLP 10/21/2022,10:07 AM

## 2022-10-21 NOTE — Evaluation (Signed)
Physical Therapy Evaluation Patient Details Name: Jane Andrews MRN: [id number] DOB: 1925-07-01 Today's Date: 10/21/2022  History of Present Illness  Jane Andrews is a 88 y.o. 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  Patient was laying in bed with neck brace and most of the history was obtained from ED physician and niece at the bedside.  Per report, patient presented with cough and congestion a few days ago, her physician was contacted and recommended giving Mucinex, patient's aide called the niece this afternoon that patient was vomiting, niece activated EMS and on arrival of EMS team, she was noted to be hypoxic with an O2 sat of 84% on room air, NRB at 15 L was provided with improvement in oxygenation to 98%.  Patient was tachycardic at rate of 136, she was febrile with a temperature of 102F (orally).  Code sepsis was activated en route to the ED.    Clinical Impression  On therapist arrival; patient in lying in supine on 2.5 L of O2; denies pain and has multiple family members in the room.  She is pleasant and agreeable to therapy assessment.  Patient reports she has had a BM so nursing comes in to assist patient with cleaning up.  She Is able to roll in bed with min to moderate assistance.  Supine to sit today with moderate to then minimal assistance. She needs min to mod assist to come fully to the edge of the bed.   Once sitting on the edge of the bed; she can sit with CGA for sitting balance with both feet on the floor. PT then assists patient to standing with 2 trials; moderate assist to then minimal assist and once standing needs min a to CGA to maintain standing balance.  PT assists patient back to bed; she needs moderate assist  for her legs.  Patient will benefit from continued skilled therapy services during the remainder of her hospital stay and at the next recommended venue of care to address deficits and promote return to optimal function.           Recommendations for follow up therapy are one component of a multi-disciplinary discharge planning process, led by the attending physician.  Recommendations may be updated based on patient status, additional functional criteria and insurance authorization.  Follow Up Recommendations Skilled nursing-short term rehab (<3 hours/day) Can patient physically be transported by private vehicle: Yes    Assistance Recommended at Discharge Frequent or constant Supervision/Assistance  Patient can return home with the following  A lot of help with walking and/or transfers;Assist for transportation;Direct supervision/assist for medications management;A lot of help with bathing/dressing/bathroom    Equipment Recommendations None recommended by PT  Recommendations for Other Services       Functional Status Assessment Patient has had a recent decline in their functional status and demonstrates the ability to make significant improvements in function in a reasonable and predictable amount of time.     Precautions / Restrictions Precautions Precautions: Fall;Cervical Required Braces or Orthoses: Cervical Brace Cervical Brace: Soft collar;At all times Restrictions Weight Bearing Restrictions: No      Mobility  Bed Mobility Overal bed mobility: Needs Assistance Bed Mobility: Rolling, Sidelying to Sit Rolling: Min assist, Mod assist Sidelying to sit: Min assist, Mod assist  General bed mobility comments: takes extra time to perform; tends to pull up with her arms; neck brace on cervical precautions Patient Response: Cooperative  Transfers Overall transfer level: Needs assistance Equipment used: 2 person hand held assist Transfers: Sit to/from Stand Sit to  Stand: Min assist, Mod assist           General transfer comment: increased time, may not always get up on first attempt; once standing can maintain balance with CGA    Ambulation/Gait                  Stairs            Wheelchair Mobility    Modified Rankin (Stroke Patients Only)       Balance Overall balance assessment: Needs assistance Sitting-balance support: No upper extremity supported, Feet supported Sitting balance-Leahy Scale: Fair Sitting balance - Comments: needs CGA for cueing and safety   Standing balance support: Bilateral upper extremity supported, During functional activity Standing balance-Leahy Scale: Fair Standing balance comment: fair standing balance with therapist CGA; patient holding to therapist for balance                             Pertinent Vitals/Pain Pain Assessment Pain Assessment: No/denies pain Pain Location: had some pain earlier after neck surgery but denies any pain today    Home Living Family/patient expects to be discharged to:: Private residence Living Arrangements: Alone Available Help at Discharge: Available 24 hours/day;Personal care attendant Type of Home: House Home Access: Stairs to enter Entrance Stairs-Rails: Psychiatric nurse of Steps: 3   Home Layout: Able to live on main level with bedroom/bathroom Home Equipment: Conservation officer, nature (2 wheels);Cane - single point;Grab bars - tub/shower;Tub bench;Wheelchair - Geophysicist/field seismologist        Extremity/Trunk Assessment   Upper Extremity Assessment Upper Extremity Assessment: Generalized weakness    Lower Extremity Assessment Lower Extremity Assessment: Generalized weakness    Cervical / Trunk Assessment Cervical / Trunk Assessment: Neck Surgery  Communication      Cognition Arousal/Alertness: Awake/alert Behavior During Therapy: WFL for tasks  assessed/performed Overall Cognitive Status: Within Functional Limits for tasks assessed                                          General Comments      Exercises     Assessment/Plan    PT Assessment Patient needs continued PT services  PT Problem List Decreased strength;Decreased activity tolerance;Decreased balance;Decreased mobility;Impaired sensation       PT Treatment Interventions DME instruction;Gait training;Stair training;Functional mobility training;Therapeutic activities;Therapeutic exercise;Balance training;Neuromuscular re-education;Patient/family education    PT Goals (Current goals can be found in the Care Plan section)  Acute Rehab PT Goals Patient Stated Goal: return home PT Goal Formulation: With patient/family Time For Goal Achievement: 11/04/22 Potential to Achieve Goals: Good    Frequency Min 3X/week     Co-evaluation               AM-PAC PT "6 Clicks" Mobility  Outcome Measure Help needed turning from your back to your side while in a flat bed without using bedrails?: A Lot Help needed moving from lying  on your back to sitting on the side of a flat bed without using bedrails?: A Lot Help needed moving to and from a bed to a chair (including a wheelchair)?: A Little Help needed standing up from a chair using your arms (e.g., wheelchair or bedside chair)?: A Little Help needed to walk in hospital room?: A Lot Help needed climbing 3-5 steps with a railing? : A Lot 6 Click Score: 14    End of Session Equipment Utilized During Treatment: Cervical collar Activity Tolerance: Patient tolerated treatment well Patient left: in chair;with call bell/phone within reach;with family/visitor present Nurse Communication: Mobility status PT Visit Diagnosis: Other abnormalities of gait and mobility (R26.89);Muscle weakness (generalized) (M62.81);Other symptoms and signs involving the nervous system (R29.898)    Time: 9735-3299 PT Time  Calculation (min) (ACUTE ONLY): 27 min   Charges:   PT Evaluation $PT Eval Low Complexity: 1 Low          11:48 AM, 10/21/22 Burrel Legrand Small Cris Gibby MPT [facility] physical therapy [place] [phone] ST:[phone]

## 2022-10-21 NOTE — Progress Notes (Signed)
Patient tolerated first 15 minutes of blood transfusion with no adverse effects reported or noted. Vital signs: T-98.0, P-77, R-15, BP-151/66, O2-100% at 2 liters. Will continue to monitor patient.

## 2022-10-21 NOTE — Progress Notes (Signed)
Patient arrived to unit noted patient attempting to cough up blood. Placed suction at bedside, contacted RT to access. MD Manuella Ghazi made aware.

## 2022-10-21 NOTE — Progress Notes (Signed)
Date and time results received: 10/21/22 1358 (use smartphrase ".now" to insert current time)  Test: Hemoglobin Critical Value: 6.7  Name of Provider Notified: Dr. Manuella Ghazi

## 2022-10-21 NOTE — Progress Notes (Signed)
Subjective:  Patient feels "okay" but endorses globus sensation with chest congestion. Patient went into A-fib with RVR last night and was started on Amiodarone drip. Patient converted to NSR at 01:13.  Objective:  Vital signs in last 24 hours: Vitals:   10/21/22 1137 10/21/22 1233 10/21/22 1300 10/21/22 1402  BP:  (!) 156/52 (!) 161/60 (!) 150/65  Pulse:    77  Resp:  '14 13 16  '$ Temp:    97.6 F (36.4 C)  TempSrc:    Oral  SpO2: 94% 100%  95%  Weight:      Height:       Weight change:   Intake/Output Summary (Last 24 hours) at 10/21/2022 1501 Last data filed at 10/21/2022 1004 Gross per 24 hour  Intake 1783.66 ml  Output 300 ml  Net 1483.66 ml   General: lying in bed with neck brace, in no acute distress Head: normocephalic, atraumatic Cardiovascular: regular rate and rhythm, systolic murmur, without rubs, or gallops, no LEE Respiratory: normal respiratory effort, bilateral basilar rhonchi, expiratory wheeze most notably in RUL Abdominal: normoactive bowel sounds, no tenderness to palpation Skin: warm, dry Central Nervous System: alert and oriented x 3 Psychiatric: Normal mood and affect   Assessment/Plan:  Principal Problem:   CAP (community acquired pneumonia) Active Problems:   CKD (chronic kidney disease) stage 4, GFR 15-29 ml/min (HCC)   Acquired hypothyroidism   Atrial fibrillation (HCC)   Normocytic anemia   Acute respiratory failure with hypoxia (HCC)   Overactive bladder   Hypoalbuminemia due to protein-calorie malnutrition (HCC)   Anxiety   Depression   Cervical stenosis of spinal canal   Acute respiratory failure with hypoxia secondary to possible Aspiration Pneumonia Chest x-ray was suggestive of bilateral pneumonia or aspiration.  Patient was started on ceftriaxone and azithromycin, we shall continue with Unasyn and azithromycin at this time with plan to de-escalate/discontinue based on blood culture (no growth after 2 days), sputum culture,  urine Legionella, strep pneumo (all pending). Procalcitonin is 17.10. Per nursing, this morning patient started to cough while swallowing pills. Speech therapy recommending N.P.O. Patient had multiple attempts at NG tube without success (including placement of tube in right mainstem bronchus). Patient was also had hemoptysis during this time. NG tube removed and chest X-ray obtained to rule out complications. Chest X-ray showed: 1. Removal of malpositioned NG tube. No right-sided pneumothorax.  2. Persistent bibasilar atelectasis or infiltrates and small left pleural effusion.  Surgery consulted for potential PEG tube but is unable to proceed given overall disposition. Patient currently on D5 LR infusion and IR will attempt NG tube placement in the morning. If unsuccessful, surgery will be consulted at [facility] for potential PEG tube placement.  -Continue antibiotics (Day 4 of 7) -Continue IV hydration -Continue Tylenol PRN -Continue Mucinex, incentive spirometry, flutter valve  -Follow-up with blood culture, urinary antigens, and sputum culture   Normocytic anemia Hemoglobin is 6.7. Patient to receive 2 units of PRBC's. Patient had hemoptysis during NG tube attempts. Baseline appears to be at 9.5-10.4. This may be due to patient's history of CKD. Anemia panel revealed iron (7), TIBC (215), and vitamin B12 (149). IV Ferrlecit 250 mg given. B12 injection ordered. -Monitor CBC   Hypoalbuminemia possibly secondary to moderate protein calorie malnutrition Albumin 2.5 (3.0 yesterday). Patient is N.P.O. and receiving D5 LR infusion. Attempting NG tube placement tomorrow morning. -Monitor CMP   CKD Stage IV Creatinine 1.65 (1.88 yesterday) with baseline creatinine at 1.5-1.7 Renally adjust medications, avoid nephrotoxic  agents/dehydration/hypotension -Trend BMP   Atrial fibrillation -Continue Lopressor   Acquired hypothyroidism Continue Synthroid   Overactive bladder Continue Myrbetriq    Anxiety and depression Continue Xanax and Cymbalta   Cervical stenosis s/p anterior cervical discectomy and fusion (10/17/2022) Continue Robaxin and Neurontin Continue neck brace   UTI Patient has symptoms consistent with previous UTI's. UA with moderate Hgb and Leukocytes. Urine culture revealed 6,000 yeast. IV Diflucan ordered. Patient is afebrile with WBC count of 10.6. Patient receiving Unasyn and Azithromycin. -Continue to monitor   LOS: 2 days   Stanford Breed, Medical Student 10/21/2022, 3:01 PM

## 2022-10-21 NOTE — Progress Notes (Addendum)
Initial Nutrition Assessment  DOCUMENTATION CODES:      INTERVENTION:  If tube placement verified: -Vital 1.2 @ 30 ml/hr via OGT and increase by 10 ml every 8 hours to goal rate of  55 ml/hr.   Tube feeding regimen provides 1584 kcal, 99 grams of protein, and 1071 ml of H2O.    NUTRITION DIAGNOSIS:  Inadequate oral intake related to inability to eat as evidenced by NPO status.   GOAL:  Provide needs based on ASPEN/SCCM guidelines   MONITOR:  Labs, Skin, I & O's, TF tolerance  REASON FOR ASSESSMENT:   Consult Enteral/tube feeding initiation and management  ASSESSMENT: Patient is a 88 yo female presents with CAP. PMH: GERD (hiatal hernia), short gut syndrome (2004), anemia, HTN.  11/3 s/p- Anterior Cervical Discectomy and Fusion (ACDF) C3-4, C4-5  11/7 SLP bedside swallow evaluation- recommending NPO.   11/7-Patient had (16 fr) -OGT with external length (66 cm) and x-ray to confirm placement pending. Enteral feeding initiation in process.   IVF- lactated ringers @ 75 ml/hr   Intake/Output Summary (Last 24 hours) at 10/21/2022 1339 Last data filed at 10/21/2022 1004 Gross per 24 hour  Intake 1883.66 ml  Output 300 ml  Net 1583.66 ml    Medications: lopressor, ferric gluconate     Latest Ref Rng & Units 10/21/2022    3:38 AM 10/19/2022    9:30 PM 10/14/2022    1:28 PM  BMP  Glucose 70 - 99 mg/dL 93  116  131   BUN 8 - 23 mg/dL '24  30  27   '$ Creatinine 0.44 - 1.00 mg/dL 1.65  1.88  1.67   Sodium 135 - 145 mmol/L 140  136  139   Potassium 3.5 - 5.1 mmol/L 3.8  4.3  4.7   Chloride 98 - 111 mmol/L 112  107  106   CO2 22 - 32 mmol/L '19  20  20   '$ Calcium 8.9 - 10.3 mg/dL 8.0  8.4  9.7      Diet Order:   Diet Order             Diet NPO time specified  Diet effective now                   EDUCATION NEEDS:  Not appropriate for education at this time  Skin:  Skin Assessment: Reviewed RN Assessment Incision on neck from 11/3   Last BM:  11/7 type 7  liquid medium  Height:   Ht Readings from Last 1 Encounters:  10/19/22 '5\' 1"'$  (1.549 m)    Weight:   Wt Readings from Last 1 Encounters:  10/19/22 50.8 kg    Ideal Body Weight:   48 kg  BMI:  Body mass index is 21.16 kg/m.  Estimated Nutritional Needs:   Kcal:  2035-5974  Protein:  87-95 gr  Fluid:  > 1.5 liters daily  Colman Cater MS,RD,CSG,LDN Contact: Shea Evans

## 2022-10-21 NOTE — Plan of Care (Signed)
  Problem: Acute Rehab PT Goals(only PT should resolve) Goal: Pt Will Go Supine/Side To Sit Outcome: Progressing Flowsheets (Taken 10/21/2022 1149) Pt will go Supine/Side to Sit: with minimal assist Goal: Patient Will Transfer Sit To/From Stand Outcome: Progressing Flowsheets (Taken 10/21/2022 1149) Patient will transfer sit to/from stand: with minimal assist Goal: Pt Will Transfer Bed To Chair/Chair To Bed Outcome: Progressing Flowsheets (Taken 10/21/2022 1149) Pt will Transfer Bed to Chair/Chair to Bed: with min assist Goal: Pt Will Ambulate Outcome: Progressing Flowsheets (Taken 10/21/2022 1149) Pt will Ambulate:  25 feet  with minimal assist  with rolling walker

## 2022-10-21 NOTE — TOC Progression Note (Signed)
Transition of Care [facility]) - Progression Note    Patient Details  Name: Jane Andrews MRN: [id number] Date of Birth: 11-May-1925  Transition of Care [facility]) CM/SW [place], [place] Phone Number: 10/21/2022, 12:57 PM  Clinical Narrative:    CSW spoke with pt and family in room about PT recommendation for SNF. They are agreeable to SNF at [facility]. CSW to complete Fl2 and send to [facility] for review. TOC to follow.     Barriers to Discharge: Continued Medical Work up  Expected Discharge Plan and Services   In-house Referral: Clinical Social Work     Living arrangements for the past 2 months: Single Family Home                                       Social Determinants of Health (SDOH) Interventions    Readmission Risk Interventions    10/21/2022    9:21 AM 06/15/2020   10:21 AM  Readmission Risk Prevention Plan  Medication Screening  Complete  Transportation Screening Complete Complete  Medication Review (RN Care Manager) Complete   HRI or [place] Complete   SW Recovery Care/Counseling Consult Complete   Palliative Care Screening Not [place] Not Applicable

## 2022-10-21 NOTE — Progress Notes (Signed)
ANTICOAGULATION CONSULT NOTE - Initial Consult  Pharmacy Consult for Lovenox  Indication: atrial fibrillation  Allergies  Allergen Reactions   Codeine Nausea And Vomiting    Patient Measurements: Height: '5\' 1"'$  (154.9 cm) Weight: 50.8 kg (112 lb) IBW/kg (Calculated) : 47.8  Vital Signs: Temp: 98 F (36.7 C) (11/06 2023) Temp Source: Oral (11/06 2023) BP: 131/77 (11/06 2330) Pulse Rate: 124 (11/06 2330)  Labs: Recent Labs    10/19/22 2130  HGB 8.0*  HCT 25.4*  PLT 199  APTT 24  LABPROT 13.5  INR 1.0  CREATININE 1.88*    Estimated Creatinine Clearance: 13.5 mL/min (A) (by C-G formula based on SCr of 1.88 mg/dL (H)).   Medical History: Past Medical History:  Diagnosis Date   Abdominal adhesions 1994   Allergic rhinitis    Anemia    Anxiety and depression    Aortic stenosis    Arthritis    Atrial fibrillation ([place]) 10/2012   Associated with severe anemia and esophageal pill impaction   Breast carcinoma (HCC)    Right mastectomy   Cholelithiasis    Essential hypertension    Gastroesophageal reflux disease    Hiatal hernia   History of blood transfusion    History of kidney stones    Hypothyroidism    Low back pain    Malabsorption    Short gut syndrome following small bowel resection surgery x2   Nephrolithiasis 2004   Painless hematuria   Short gut syndrome    Bowel resection , 2004   Upper GI bleed 2004   Multiple episodes of melena-? due to gastritis or adverse drug effect (nonsteroidals, small bowel ulceration with Fosamax); caused by Pepto-Bismol during one Emergency Department evaluation    Assessment: 88 y/o F with shortness of breath, pt in Afib/RVR, starting Lovenox, no anti-coagulants PTA, Hgb 8, renal dysfunction requiring dosing frequency adjustment.   Goal of Therapy:  Monitor platelets by anticoagulation protocol: Yes   Plan:  Lovenox 50 mg subcutaneous q24h Daily CBC Monitor for bleeding  Narda Bonds, PharmD, BCPS Clinical  Pharmacist Phone: [phone]

## 2022-10-21 NOTE — Progress Notes (Signed)
MBSS at [facility], inpatient:    10/21/22 1200  SLP Visit Information  SLP Received On 10/21/22  General Information  HPI Jane Andrews is a 88 y.o. 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  Patient was lying in bed with neck brace and most of the history was obtained from ED physician and niece at the bedside.  Per report, patient presented with cough and congestion a few days ago, her physician was contacted and recommended giving Mucinex, patient's aide called the niece this afternoon that patient was vomiting, niece activated EMS and on arrival of EMS team, she was noted to be hypoxic with an O2 sat of 84% on room air, NRB at 15 L was provided with improvement in oxygenation to 98%.  Patient was tachycardic at rate of 136, she was febrile with a temperature of 102F (orally).  Code sepsis was activated en route to the ED.;CXR indicated on 10/19/22 potential Aspiration PNA or B PNA.BSEcompleted this AM and MBSS recommended.  Caregiver present No  Diet Prior to this Study NPO  Temperature  Normal  Respiratory Status WFL  Supplemental O2 Nasal cannula  History of Recent Intubation Yes  Length of Intubations (days) 1 days  Date extubated 10/17/22  Number of Intubations 1  Self extubation  No  Behavior/Cognition Alert;Cooperative  Self-Feeding Abilities Able to self-feed  Baseline vocal quality/speech Normal  Volitional Cough Able to elicit  Volitional Cough Assessment Appears WFL  Volitional Swallow Able to elicit  Anatomy Presence of cervical hardware (posterior pharyngeal wall swelling s/p ACDF surgery on Friday)  Orofacial Exam  Oral Cavity Assessment WFL  Oral Cavity - Dentition Adequate natural dentition  Orofacial  Anatomy WFL  Oral Motor/Sensory Function WFL  Thin Liquids (Level 0)  Thin Liquids  Impaired  Bolus delivery method Spoon  Thin Liquid - Impairment Pharyngeal impairment  Initiation of swallow  Valleculae  Soft palate elevation Complete  Tongue base retraction Wide column of contrast or air between tongue base and PPW  Laryngeal elevation Complete superior movement of thyroid cartilage with complete approximation of arytenoids to epiglottic petiole  Anterior hyoid excursion Complete  Epiglottic movement Partial  Laryngeal vestibule closure Incomplete  Pharyngeal stripping wave  Present - diminished  Pharyngeal contraction (A/P view only) N/A  Pharyngoesophageal segment opening Partial distention/partial duration, partial obstruction of flow  Pharyngeal residue Collection of residue within or on pharyngeal structures  Location of pharyngeal residue Valleculae;Pharyngeal wall;Pyriform sinuses  Penetration/Aspiration Scale (PAS) score 7.  Material enters airway, passes BELOW cords and not ejected out despite cough attempt by patient  Mildly thick liquids (Level 2, nectar thick)  Mildly thick liquids (Level 2, nectar thick) Impaired  Bolus delivery method Spoon  Mildly Thick Liquid - Impairment Pharyngeal impairment  Initiation of swallow  Valleculae  Soft palate elevation Complete  Tongue base retraction Wide column of contrast or air between tongue base and PPW  Laryngeal elevation Complete superior movement of thyroid cartilage with complete approximation of arytenoids to epiglottic petiole  Anterior hyoid excursion Complete  Epiglottic movement Partial  Laryngeal vestibule closure Incomplete  Pharyngeal stripping wave  Present - diminished  Pharyngeal contraction (A/P view only) N/A  Pharyngoesophageal segment opening Partial distention/partial duration, partial obstruction of flow  Pharyngeal residue Collection of residue within or on pharyngeal  structures  Location of pharyngeal  residue Valleculae;Pharyngeal wall;Pyriform sinuses  Penetration/Aspiration Scale (PAS) score 7.  Material enters airway, passes BELOW cords and not ejected out despite cough attempt by patient  Moderately thick liquids (Level 3, honey thick)  Moderately thick liquids (Level 3, honey thick) Impaired  Moderately Thick Liquid - Impairment Pharyngeal impairment  Initiation of swallow  Valleculae  Soft palate elevation Complete  Tongue base retraction Wide column of contrast or air between tongue base and PPW  Laryngeal elevation Complete superior movement of thyroid cartilage with complete approximation of arytenoids to epiglottic petiole  Anterior hyoid excursion Complete  Epiglottic movement Partial  Laryngeal vestibule closure Incomplete  Pharyngeal stripping wave  Present - diminished  Pharyngeal contraction (A/P view only) N/A  Pharyngoesophageal segment opening Partial distention/partial duration, partial obstruction of flow  Pharyngeal residue Collection of residue within or on pharyngeal structures  Location of pharyngeal residue Valleculae;Pharyngeal wall;Pyriform sinuses  Penetration/Aspiration Scale (PAS) score 7.  Material enters airway, passes BELOW cords and not ejected out despite cough attempt by patient  Puree  Puree Impaired  Puree - Impairment Pharyngeal impairment  Initiation of swallow Valleculae  Soft palate elevation Complete  Tongue base retraction Wide column of contrast or air between tongue base and PPW  Laryngeal elevation Complete superior movement of thyroid cartilage with complete approximation of arytenoids to epiglottic petiole  Anterior hyoid excursion Complete  Epiglottic movement Partial  Laryngeal vestibule closure Incomplete  Pharyngeal stripping wave  Present - diminished  Pharyngeal contraction (A/P view only) N/A  Pharyngoesophageal segment opening Partial distention/partial duration, partial obstruction of flow  Pharyngeal residue Collection of  residue within or on pharyngeal structures;Majority of contrast within or on pharyngeal structures  Location of pharyngeal residue Valleculae;Pharyngeal wall;Pyriform sinuses  Penetration/Aspiration Scale (PAS) score 7.  Material enters airway, passes BELOW cords and not ejected out despite cough attempt by patient  Solid  Solid Not Tested  Pill  Pill Not Tested  Compensatory Strategies  Compensatory strategies Yes  Effortful swallow Ineffective  Multiple swallows Effective  Effective Multiple Swallows Thin liquid (Level 0);Mildly thick liquid (Level 2, nectar thick);Moderately thick liquid (Level 3, honey thick);Puree  Chin tuck Ineffective  Left head turn Ineffective  Right head turn Ineffective  Clinical Impression  Clinical Impression Pt presents with moderate/severe pharyngeal phase dysphagia characterized by vallecular trigger with tsp thin, NTL, HTL, and puree, posterior pharyngeal wall edema negatively impacting epiglottic deflection and pharyngeal constriction resulting in penetration during the swallow, aspiration after the swallow, and pharyngeal residue. Pt with episodes of gross aspiration after the swallow from the residuals which was sensed and mostly removed, but then unable to completely clear pharynx. Head turns left and right were attempted, but found to be ineffective. Pt with limited head movement due to soft neck brace and discomfort for chin down. Recommend NPO with alternative means of nutrition via NG in the hopes that swelling will decrease over the next 10-14 days. Pt has a very strong cough, however she is unable to completely clear all aspirates and then unable to completely clear pharynx at this time. Consider consultation with Dr. Trenton Gammon, who did the surgery to see if steroids could be beneficial during this stage of her recovery. SLP to follow and can administer single ice chips for comfort and to help prevent muscle disuse atrophy (SLP for puree trials to assess for  readiness for repeat instrumental assessment). RN to complete oral care and have suction at bedside. Pt was shown how to self use  the [place] suction and encouraged to cough and clear. SLP will follow.   SLP Visit Diagnosis Dysphagia, pharyngeal phase (R13.13);Dysphagia, pharyngoesophageal phase (R13.14)  Factors that may increase risk of adverse event in presence of aspiration ([place] 2021) Limited mobility;Frail or deconditioned;Frequent aspiration of large volumes  Swallowing Evaluation Recommendations  Recommendations NPO;Alternative means of nutrition - NG Tube  Medication Administration Via alternative means  Swallowing strategies    (trials puree with SLP only)  Postural changes Position pt fully upright for meals;Stay upright 30-60 min after meals  Oral care recommendations Oral care QID (4x/day);Staff/trained caregiver to provide oral care  Recommended consults Consider dietitian consultation  Treatment Plan  Treatment recommendations Therapy as outlined in treatment plan below  Follow-up recommendations Skilled nursing-short term rehab (<3 hours/day)  Assistance recommended at discharge Frequent or constant supervision/assistance  Functional status assessment Patient has had a recent decline in their functional status and demonstrates the ability to make significant improvements in function in a reasonable and predictable amount of time.  Treatment frequency Min 2x/week  Treatment duration 1 week  Interventions Aspiration precaution training;Trials of upgraded texture/liquids;Patient/family education  Goal Planning  Prognosis for Safe Diet Advancement Fair  Barriers to Reach Goals Severity of deficits  Patient/Family Stated Goal Swallow safely  Consulted and agree with results and recommendations Patient;Physician  SLP Time Calculation  SLP Start Time (ACUTE ONLY) 1151  SLP Stop Time (ACUTE ONLY) 1223  SLP Time Calculation (min) (ACUTE ONLY) 32 min  SLP Evaluations  $  SLP Speech Visit 1 Visit  SLP Evaluations  $MBS Swallow 1 Procedure   Thank you,  Genene Churn, CCC-SLP [phone]

## 2022-10-21 NOTE — [place] FL2 (Signed)
[place] MEDICAID FL2 LEVEL OF CARE SCREENING TOOL     IDENTIFICATION  Patient Name: Jane Andrews Birthdate: 09/17/1925 Sex: female Admission Date (Current Location): 10/19/2022  [facility] and [place] Number:  [employer] and Address:  [facility] [address] [place]      Provider Number: [id number]  Attending Physician Name and Address:  Rodena Goldmann, DO  Relative Name and Phone Number:       Current Level of Care: Hospital Recommended Level of Care: [place] Prior Approval Number:    Date Approved/Denied:   PASRR Number: [id number]  Discharge Plan: SNF    Current Diagnoses: Patient Active Problem List   Diagnosis Date Noted   Acute respiratory failure with hypoxia ([place]) 10/20/2022   Overactive bladder 10/20/2022   Hypoalbuminemia due to protein-calorie malnutrition ([place]) 10/20/2022   Anxiety 10/20/2022   Depression 10/20/2022   Cervical stenosis of spinal canal 10/20/2022   CAP (community acquired pneumonia) 10/19/2022   Cervical myelopathy ([place]) 10/17/2022   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 ([place]) 05/23/2022   Central cord syndrome of cervical spinal cord ([place]) 05/19/2022   SAH (subarachnoid hemorrhage) ([place]) 05/13/2022   TBI (traumatic brain injury) ([place]) 05/13/2022   Bladder mass 04/16/2022   Hyperkalemia 04/09/2022   Mixed conductive and sensorineural hearing loss of both ears 03/03/2022   Normocytic anemia 02/11/2022   SIRS (systemic inflammatory response syndrome) ([place]) 02/10/2022   Bilateral external ear infections 02/10/2022   Sinusitis 02/10/2022   Nephrolithiasis 01/07/2022   Vulvar irritation 12/20/2021   Burning with urination 12/20/2021   Symptoms of urinary tract infection 12/20/2021   Diarrhea 03/05/2021   Acute kidney failure, unspecified ([place]) 08/15/2020   Scoliosis 06/26/2020   Thrombocytopenia ([place]) 06/22/2020   Aspiration pneumonia ([place]) 06/22/2020    Chronic constipation 06/21/2020   Post-menopausal osteoporosis 06/21/2020   Essential tremor 06/21/2020   Vitamin B 12 deficiency 06/21/2020   Major depression, recurrent, chronic ([place]) 06/21/2020   Status post total replacement of right hip    AKI (acute kidney injury) ([place])    Postoperative anemia due to acute blood loss    Closed right hip fracture (HCC) 06/14/2020   IDA (iron deficiency anemia) 02/28/2020   Atrial fibrillation with RVR ([place]) 12/25/2018   GERD (gastroesophageal reflux disease) 12/25/2018   Chronic cholecystitis s/p lap cholecystectomy 06/25/2018 06/18/2018   Vagal reaction 04/12/2016   Abdominal pain 09/19/2014   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   Ecchymoses, spontaneous 05/31/2013   Acquired hypothyroidism    Atrial fibrillation (HCC)    Breast carcinoma (HCC)    Malabsorption    CKD (chronic kidney disease) stage 4, GFR 15-29 ml/min (HCC) 10/21/2012   Anemia, normocytic normochromic 07/06/2012   Chronic diarrhea 02/10/2012   Hypertension 02/10/2012    Orientation RESPIRATION BLADDER Height & Weight     Self, Place  O2 (2L) Incontinent Weight: 112 lb (50.8 kg) Height:  '5\' 1"'$  (154.9 cm)  BEHAVIORAL SYMPTOMS/MOOD NEUROLOGICAL BOWEL NUTRITION STATUS      Continent Diet (See D/C summary)  AMBULATORY STATUS COMMUNICATION OF NEEDS Skin   Extensive Assist Verbally Normal (closed incision on neck)                       Personal Care Assistance Level of Assistance  Bathing, Feeding, Dressing Bathing Assistance: Limited assistance Feeding assistance: Independent Dressing Assistance: Limited assistance     Functional Limitations Info  Sight, Hearing, Speech Sight  Info: Impaired Hearing Info: Impaired Speech Info: Adequate    SPECIAL CARE FACTORS FREQUENCY  PT (By licensed PT), OT (By licensed OT)     PT Frequency: 5 times weekly OT Frequency: 5 times weekly            Contractures Contractures Info: Not present    Additional  Factors Info  Code Status, Allergies Code Status Info: FULL Allergies Info: Codeine           Current Medications (10/21/2022):  This is the current hospital active medication list Current Facility-Administered Medications  Medication Dose Route Frequency Provider Last Rate Last Admin   acetaminophen (TYLENOL) tablet 650 mg  650 mg Oral Q6H PRN Adefeso, Oladapo, DO       Or   acetaminophen (TYLENOL) suppository 650 mg  650 mg Rectal Q6H PRN Adefeso, Oladapo, DO       ALPRAZolam (XANAX) tablet 1 mg  1 mg Oral QHS Adefeso, Oladapo, DO       Ampicillin-Sulbactam (UNASYN) 3 g in sodium chloride 0.9 % 100 mL IVPB  3 g Intravenous Q12H Poindexter, Leann T, RPH   Stopped at 10/21/22 0915   azithromycin (ZITHROMAX) 500 mg in sodium chloride 0.9 % 250 mL IVPB  500 mg Intravenous Q24H Adefeso, Oladapo, DO   Stopped at 10/20/22 2120   Chlorhexidine Gluconate Cloth 2 % PADS 6 each  6 each Topical Daily Adefeso, Oladapo, DO   6 each at 10/21/22 0801   dextromethorphan-guaiFENesin ([place] DM) 30-600 MG per 12 hr tablet 1 tablet  1 tablet Oral BID Adefeso, Oladapo, DO   1 tablet at 10/21/22 0845   DULoxetine (CYMBALTA) DR capsule 60 mg  60 mg Oral q morning Adefeso, Oladapo, DO   60 mg at 10/20/22 1001   enoxaparin (LOVENOX) injection 50 mg  50 mg Subcutaneous Q24H Erenest Blank, RPH       feeding supplement (ENSURE ENLIVE / ENSURE PLUS) liquid 237 mL  237 mL Oral BID BM Adefeso, Oladapo, DO   237 mL at 10/21/22 0848   ferric gluconate (FERRLECIT) 250 mg in sodium chloride 0.9 % 250 mL IVPB  250 mg Intravenous Daily Heath Lark D, DO 135 mL/hr at 10/21/22 1053 250 mg at 10/21/22 1053   gabapentin (NEURONTIN) capsule 100 mg  100 mg Oral BID Adefeso, Oladapo, DO   100 mg at 10/20/22 1001   gabapentin (NEURONTIN) capsule 200 mg  200 mg Oral QHS Adefeso, Oladapo, DO       HYDROmorphone (DILAUDID) injection 0.5 mg  0.5 mg Intravenous Q4H PRN Manuella Ghazi, Pratik D, DO   0.5 mg at 10/21/22 0532   lactated ringers  infusion   Intravenous Continuous Heath Lark D, DO 75 mL/hr at 10/21/22 1052 New Bag at 10/21/22 1052   levothyroxine (SYNTHROID) tablet 75 mcg  75 mcg Oral QAC breakfast Adefeso, Oladapo, DO       methocarbamol (ROBAXIN) tablet 750 mg  750 mg Oral QID PRN Adefeso, Oladapo, DO       metoprolol tartrate (LOPRESSOR) injection 5 mg  5 mg Intravenous Q6H Shah, Pratik D, DO   5 mg at 10/21/22 1217   mirabegron ER (MYRBETRIQ) tablet 50 mg  50 mg Oral Daily Adefeso, Oladapo, DO   50 mg at 10/20/22 1000   ondansetron (ZOFRAN) tablet 4 mg  4 mg Oral Q6H PRN Adefeso, Oladapo, DO       Or   ondansetron (ZOFRAN) injection 4 mg  4 mg Intravenous Q6H PRN Adefeso, Oladapo, DO  sodium chloride flush (NS) 0.9 % injection 10-40 mL  10-40 mL Intracatheter Q12H Adefeso, Oladapo, DO   10 mL at 10/21/22 0801   sodium chloride flush (NS) 0.9 % injection 10-40 mL  10-40 mL Intracatheter PRN Bernadette Hoit, DO         Discharge Medications: Please see discharge summary for a list of discharge medications.  Relevant Imaging Results:  Relevant Lab Results:   Additional Information SSN: [place] [id number]  [place]

## 2022-10-21 NOTE — Progress Notes (Signed)
Per RN  request checked in with patient to see if she needed NT suctioning. Patient had RUL rhonchi. Patient has strong cough( no NT suctioning need at this time) and does have [place] at hand.  Ms. Jane Andrews had an SpO2 of 96% on 3 L/m. I will  report to nightshift to round on patient.

## 2022-10-21 NOTE — Progress Notes (Addendum)
16Fr NG tube placed per Speech recommendations. Patient tolerated well. Chest xray ordered to confirm placement. Dr. Manuella Ghazi aware.  Chest xray showed incorrect placement of NG tube. More attempts made to place NG tube, but unable to place. Dr. Manuella Ghazi made aware.

## 2022-10-21 NOTE — Progress Notes (Signed)
Per patient's niece, post-op dressing due to be changed yesterday. Per AVS discharge instructions from surgery on 11/3, dressing to be removed and steri-strips to remain in place. Soft C-collar temporarily removed, tape and gauze dressing removed, steri-strips intact. Old, bloody drainage noted on steri-strips. Soft C-Collar placed back around patient's neck.

## 2022-10-21 NOTE — Progress Notes (Signed)
Pharmacy Antibiotic Note  Jane Andrews is a 88 y.o. female admitted on 10/19/2022 with UTI.  Pharmacy has been consulted for fluconazole dosing.  Plan: Fluconazole 100 mg IV daily Monitor labs, c/s, and patient improvement.  Height: '5\' 1"'$  (154.9 cm) Weight: 50.8 kg (112 lb) IBW/kg (Calculated) : 47.8  Temp (24hrs), Avg:97.8 F (36.6 C), Min:97.5 F (36.4 C), Max:98.2 F (36.8 C)  Recent Labs  Lab 10/19/22 2130 10/20/22 0019 10/21/22 0338 10/21/22 0552  WBC 11.4*  --   --  10.6*  CREATININE 1.88*  --  1.65*  --   LATICACIDVEN 1.8 1.4  --   --     Estimated Creatinine Clearance: 15.4 mL/min (A) (by C-G formula based on SCr of 1.65 mg/dL (H)).    Allergies  Allergen Reactions   Codeine Nausea And Vomiting    Antimicrobials this admission: Fluconazole 11/7 Unasyn 11/6 >> Azith 11/5 >>11/6 changed to doxy 11/7 >>11/9  CTX 11/5   Microbiology results: 11/5 Bcx: ngtd 11/5 Ucx: 6k yeast 11/5 Sputum Cx: pending MRSA PCR: pending  Thank you for allowing pharmacy to be a part of this patient's care.  Ramond Craver 10/21/2022 3:51 PM

## 2022-10-21 NOTE — Progress Notes (Signed)
Patient converted to normal sinus rhythm at 0113. ECG strip printed and placed in chart. Adefeso DO notified.

## 2022-10-22 ENCOUNTER — Inpatient Hospital Stay (HOSPITAL_COMMUNITY): Payer: Medicare Other

## 2022-10-22 DIAGNOSIS — J189 Pneumonia, unspecified organism: Secondary | ICD-10-CM | POA: Diagnosis not present

## 2022-10-22 DIAGNOSIS — D649 Anemia, unspecified: Secondary | ICD-10-CM | POA: Diagnosis not present

## 2022-10-22 DIAGNOSIS — M4802 Spinal stenosis, cervical region: Secondary | ICD-10-CM | POA: Diagnosis not present

## 2022-10-22 DIAGNOSIS — J9601 Acute respiratory failure with hypoxia: Secondary | ICD-10-CM | POA: Diagnosis not present

## 2022-10-22 LAB — CBC
HCT: 30.5 % — ABNORMAL LOW (ref 36.0–46.0)
Hemoglobin: 9.7 g/dL — ABNORMAL LOW (ref 12.0–15.0)
MCH: 27.2 pg (ref 26.0–34.0)
MCHC: 31.8 g/dL (ref 30.0–36.0)
MCV: 85.7 fL (ref 80.0–100.0)
Platelets: 161 10*3/uL (ref 150–400)
RBC: 3.56 MIL/uL — ABNORMAL LOW (ref 3.87–5.11)
RDW: 16.1 % — ABNORMAL HIGH (ref 11.5–15.5)
WBC: 8.9 10*3/uL (ref 4.0–10.5)
nRBC: 0 % (ref 0.0–0.2)

## 2022-10-22 LAB — COMPREHENSIVE METABOLIC PANEL
ALT: 7 U/L (ref 0–44)
AST: 16 U/L (ref 15–41)
Albumin: 2.7 g/dL — ABNORMAL LOW (ref 3.5–5.0)
Alkaline Phosphatase: 74 U/L (ref 38–126)
Anion gap: 8 (ref 5–15)
BUN: 19 mg/dL (ref 8–23)
CO2: 22 mmol/L (ref 22–32)
Calcium: 8.3 mg/dL — ABNORMAL LOW (ref 8.9–10.3)
Chloride: 112 mmol/L — ABNORMAL HIGH (ref 98–111)
Creatinine, Ser: 1.5 mg/dL — ABNORMAL HIGH (ref 0.44–1.00)
GFR, Estimated: 32 mL/min — ABNORMAL LOW (ref >60)
Glucose, Bld: 98 mg/dL (ref 70–99)
Potassium: 3.6 mmol/L (ref 3.5–5.1)
Sodium: 142 mmol/L (ref 135–145)
Total Bilirubin: 1 mg/dL (ref 0.3–1.2)
Total Protein: 5.9 g/dL — ABNORMAL LOW (ref 6.5–8.1)

## 2022-10-22 LAB — TYPE AND SCREEN
ABO/RH(D): O POS
Antibody Screen: NEGATIVE
Unit division: 0
Unit division: 0

## 2022-10-22 LAB — BPAM RBC
Blood Product Expiration Date: 202312102359
Blood Product Expiration Date: 202312102359
ISSUE DATE / TIME: 202311071727
ISSUE DATE / TIME: 202311072055
Unit Type and Rh: 5100
Unit Type and Rh: 5100

## 2022-10-22 LAB — MAGNESIUM: Magnesium: 2.1 mg/dL (ref 1.7–2.4)

## 2022-10-22 MED ORDER — HYDROMORPHONE HCL 1 MG/ML IJ SOLN: 0.5000 mg | INTRAMUSCULAR | Status: DC | PRN

## 2022-10-22 MED ORDER — DEXAMETHASONE SODIUM PHOSPHATE 4 MG/ML IJ SOLN
4.0000 mg | Freq: Four times a day (QID) | INTRAMUSCULAR | Status: DC
  Administered 2022-10-22 – 2022-10-26 (×16): 4 mg via INTRAVENOUS
  Filled 2022-10-22 (×17): qty 1

## 2022-10-22 MED ORDER — HYDROMORPHONE HCL 1 MG/ML IJ SOLN
0.5000 mg | INTRAMUSCULAR | Status: DC | PRN
  Administered 2022-10-23 (×2): 0.75 mg via INTRAVENOUS
  Administered 2022-10-23 (×2): 0.5 mg via INTRAVENOUS
  Administered 2022-10-24 – 2022-10-25 (×4): 0.75 mg via INTRAVENOUS
  Administered 2022-10-26 – 2022-10-30 (×11): 0.5 mg via INTRAVENOUS
  Filled 2022-10-22 (×2): qty 1
  Filled 2022-10-22 (×2): qty 0.5
  Filled 2022-10-22: qty 1
  Filled 2022-10-22 (×3): qty 0.5
  Filled 2022-10-22: qty 1
  Filled 2022-10-22 (×2): qty 0.5
  Filled 2022-10-22: qty 1
  Filled 2022-10-22 (×4): qty 0.5
  Filled 2022-10-22: qty 1
  Filled 2022-10-22 (×3): qty 0.5
  Filled 2022-10-22 (×2): qty 1

## 2022-10-22 MED ORDER — HYDROMORPHONE HCL 1 MG/ML IJ SOLN
0.5000 mg | Freq: Once | INTRAMUSCULAR | Status: AC
  Administered 2022-10-22: 0.5 mg via INTRAVENOUS
  Filled 2022-10-22: qty 0.5

## 2022-10-22 NOTE — Plan of Care (Signed)
  Problem: Acute Rehab OT Goals (only OT should resolve) Goal: Pt. Will Perform Grooming Flowsheets (Taken 10/22/2022 1021) Pt Will Perform Grooming:  with min guard assist  sitting Goal: Pt. Will Transfer To Toilet [place] (Taken 10/22/2022 1021) Pt Will Transfer to Toilet:  with modified independence  ambulating Goal: Pt/Caregiver Will Perform Home Exercise Program Flowsheets (Taken 10/22/2022 1021) Pt/caregiver will Perform Home Exercise Program:  Increased ROM  Increased strength  Both right and left upper extremity  Independently  Solita Macadam OT, MOT

## 2022-10-22 NOTE — Evaluation (Signed)
Occupational Therapy Evaluation Patient Details Name: Jane Andrews MRN: [id number] DOB: [birth year] Today's Date: 10/22/2022   History of Present Illness Jane Andrews is a 88 y.o. 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  Patient was laying in bed with neck brace and most of the history was obtained from ED physician and niece at the bedside.  Per report, patient presented with cough and congestion a few days ago, her physician was contacted and recommended giving Mucinex, patient's aide called the niece this afternoon that patient was vomiting, niece activated EMS and on arrival of EMS team, she was noted to be hypoxic with an O2 sat of 84% on room air, NRB at 15 L was provided with improvement in oxygenation to 98%.  Patient was tachycardic at rate of 136, she was febrile with a temperature of 102F (orally).  Code sepsis was activated en route to the ED.   Clinical Impression   Pt agreeable to OT evaluation. Pt donning cervical collar during session. Pt assisted much at baseline for ADL's. Today pt was able to complete bed mobility with min A. Pt able to stand and ambulate a few steps with RW with min G assistance. B UE noted to be weak with decreased A/ROM but [facility] P/ROM. Pt requested to stay in bed rather than go to the chair today. Pt left in bed with family present and call bell within reach. Pt will benefit from continued OT in the hospital and recommended venue below to increase strength, balance, and endurance for safe ADL's.        Recommendations for follow up therapy are one component of a multi-disciplinary discharge planning process, led by the attending physician.  Recommendations may be updated based on patient status,  additional functional criteria and insurance authorization.   Follow Up Recommendations  Skilled nursing-short term rehab (<3 hours/day)    Assistance Recommended at Discharge Intermittent Supervision/Assistance  Patient can return home with the following A little help with walking and/or transfers;A lot of help with bathing/dressing/bathroom;Assistance with cooking/housework;Assist for transportation;Help with stairs or ramp for entrance    Functional Status Assessment  Patient has had a recent decline in their functional status and demonstrates the ability to make significant improvements in function in a reasonable and predictable amount of time.  Equipment Recommendations  None recommended by OT    Recommendations for Other Services       Precautions / Restrictions Precautions Precautions: Fall;Cervical Required Braces or Orthoses: Cervical Brace Cervical Brace: Soft collar;At all times Restrictions Weight Bearing Restrictions: No      Mobility Bed Mobility Overal bed mobility: Needs Assistance Bed Mobility: Rolling, Sidelying to Sit Rolling: Min guard Sidelying to sit: Min assist Sit to supine: min G to to min A. Min A needed to scoot up in bed.        General bed mobility comments: Assist to pull to sit. Labored movement.    Transfers Overall transfer level: Needs assistance Equipment used: Rolling walker (2 wheels) Transfers: Sit to/from Stand, Bed to chair/wheelchair/BSC Sit to Stand: Min guard     Step pivot transfers: Min guard     General transfer comment: Labored movement but able to stand with RW and ambulate forward and backward before returning to bed with Min G assist.  Balance Overall balance assessment: Needs assistance Sitting-balance support: No upper extremity supported, Feet supported Sitting balance-Leahy Scale: Fair Sitting balance - Comments: seated EOB   Standing balance support: Bilateral upper extremity supported, During  functional activity Standing balance-Leahy Scale: Fair Standing balance comment: using RW                           ADL either performed or assessed with clinical judgement   ADL Overall ADL's : Needs assistance/impaired     Grooming: Moderate assistance;Sitting   Upper Body Bathing: Moderate assistance;Sitting       Upper Body Dressing : Minimal assistance;Moderate assistance;Sitting   Lower Body Dressing: Maximal assistance;Sitting/lateral leans Lower Body Dressing Details (indicate cue type and reason): Pt attempted to reach socks but was unsteady and unsafe doing so seated at EOB. Toilet Transfer: Min guard;Ambulation;Rolling walker (2 wheels) Toilet Transfer Details (indicate cue type and reason): Partialy simulated via sit to stand at EOB and ambulation forward and backward before returning to EOB. Toileting- Clothing Manipulation and Hygiene: Moderate assistance;Sitting/lateral lean       Functional mobility during ADLs: Min guard;Rolling walker (2 wheels)       Vision Baseline Vision/History: 1 Wears glasses Ability to See in Adequate Light: 0 Adequate Patient Visual Report: No change from baseline Vision Assessment?: No apparent visual deficits                Pertinent Vitals/Pain Pain Assessment Pain Assessment: Faces Faces Pain Scale: No hurt     Hand Dominance Right   Extremity/Trunk Assessment Upper Extremity Assessment Upper Extremity Assessment: Generalized weakness (3-/5 MMT for B shoulder flexion.)   Lower Extremity Assessment Lower Extremity Assessment: Defer to PT evaluation   Cervical / Trunk Assessment Cervical / Trunk Assessment: Neck Surgery   Communication Communication Communication: No difficulties   Cognition Arousal/Alertness: Awake/alert Behavior During Therapy: WFL for tasks assessed/performed Overall Cognitive Status: Within Functional Limits for tasks assessed                                                         Home Living Family/patient expects to be discharged to:: Private residence Living Arrangements: Alone Available Help at Discharge: Available 24 hours/day;Personal care attendant Type of Home: House Home Access: Stairs to enter Technical brewer of Steps: 3 Entrance Stairs-Rails: Right;Left Home Layout: Able to live on main level with bedroom/bathroom     Bathroom Shower/Tub: Teacher, early years/pre: Standard Bathroom Accessibility: Yes How Accessible: Accessible via walker Home Equipment: Rolling Walker (2 wheels);Cane - single point;Grab bars - tub/shower;Tub bench;Wheelchair - manual;BSC/3in1   Additional Comments: History taken from PT note      Prior Functioning/Environment Prior Level of Function : Needs assist       Physical Assist : Mobility (physical);ADLs (physical)     Mobility Comments: [place] ambulator with RW ADLs Comments: Assited for all ADL's but feeding. Assisted with IADL's.        OT Problem List: Decreased strength;Decreased range of motion;Decreased activity tolerance;Impaired balance (sitting and/or standing)      OT Treatment/Interventions: Self-care/ADL training;Therapeutic exercise;Therapeutic activities;Patient/family education;Balance training    OT Goals(Current goals can be found in the care plan section) Acute Rehab OT Goals Patient Stated Goal: Go to rehab. OT Goal Formulation: With patient/family Time  For Goal Achievement: 11/05/22 Potential to Achieve Goals: Good  OT Frequency: Min 1X/week                                   End of Session Equipment Utilized During Treatment: Rolling walker (2 wheels);Cervical collar  Activity Tolerance: Patient tolerated treatment well Patient left: in bed;with call bell/phone within reach;with family/visitor present  OT Visit Diagnosis: Unsteadiness on feet (R26.81);Other abnormalities of gait and mobility (R26.89);Muscle weakness  (generalized) (M62.81)                Time: 6644-0347 OT Time Calculation (min): 19 min Charges:  OT General Charges $OT Visit: 1 Visit OT Evaluation $OT Eval Low Complexity: 1 Low  Brinae Woods OT, MOT   Larey Seat 10/22/2022, 10:19 AM

## 2022-10-22 NOTE — Hospital Course (Signed)
88 y.o. 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  Patient was laying in bed with neck brace and most of the history was obtained from ED physician and niece at the bedside.  Per report, patient presented with cough and congestion a few days ago, her physician was contacted and recommended giving Mucinex, patient's aide called the niece this afternoon that patient was vomiting, niece activated EMS and on arrival of EMS team, she was noted to be hypoxic with an O2 sat of 84% on room air, NRB at 15 L was provided with improvement in oxygenation to 98%.  Patient was tachycardic at rate of 136, she was febrile with a temperature of 102F (orally).  Code sepsis was activated en route to the ED.   ED Course:  In the emergency department, temperature was 98.6 F, respiration was 25/min, HR 138 bpm, BP was 110/65, O2 sat was 94% on supplemental oxygen at 2 LPM.  Work-up in the ED showed normocytic anemia with WBC of 11.4, BMP showed sodium 136, potassium 4.3, chloride 107, bicarb 20, glucose 116, BUN 30, creatinine 1.88 (baseline creatinine at 1.5-1.7).  Albumin 3.0, AST 42, ALT 14, ALP 117.  Lactic acid was 1.8 > 1.4.  Blood culture pending, influenza A, B, SARS coronavirus 2 was negative.  Chest x-ray showed new  patchy airspace disease in both lung bases consistent with bilateral pneumonia or aspiration. Minimal pleural effusions. He was treated with IV ceftriaxone and azithromycin, IV hydration was provided.  Hospitalist was asked to admit patient for further evaluation and management.

## 2022-10-22 NOTE — Progress Notes (Signed)
PROGRESS NOTE   Jane Andrews  FOY:[id number] DOB: Aug 08, 1925 DOA: 10/19/2022 PCP: Redmond School, MD   Chief Complaint  Patient presents with   Shortness of Breath   Level of care: Telemetry  Brief Admission History:  88 y.o. 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  Patient was laying in bed with neck brace and most of the history was obtained from ED physician and niece at the bedside.  Per report, patient presented with cough and congestion a few days ago, her physician was contacted and recommended giving Mucinex, patient's aide called the niece this afternoon that patient was vomiting, niece activated EMS and on arrival of EMS team, she was noted to be hypoxic with an O2 sat of 84% on room air, NRB at 15 L was provided with improvement in oxygenation to 98%.  Patient was tachycardic at rate of 136, she was febrile with a temperature of 102F (orally).  Code sepsis was activated en route to the ED.   ED Course:  In the emergency department, temperature was 98.6 F, respiration was 25/min, HR 138 bpm, BP was 110/65, O2 sat was 94% on supplemental oxygen at 2 LPM.  Work-up in the ED showed normocytic anemia with WBC of 11.4, BMP showed sodium 136, potassium 4.3, chloride 107, bicarb 20, glucose 116, BUN 30, creatinine 1.88 (baseline creatinine at 1.5-1.7).  Albumin 3.0, AST 42, ALT 14, ALP 117.  Lactic acid was 1.8 > 1.4.  Blood culture pending, influenza A, B, SARS coronavirus 2 was negative.  Chest x-ray showed new  patchy airspace disease in both lung bases consistent with bilateral pneumonia or aspiration. Minimal pleural effusions. He was treated with IV ceftriaxone and azithromycin, IV hydration was provided.  Hospitalist was asked to  admit patient for further evaluation and management.     Assessment and Plan:  Jane Andrews / Aspiration pneumonia - continue IV unasyn for now - plan to complete at least 3 full days of treatment - supportive measures  Dysphagia  - postop complication of ACDF - discussed with neurosurgeon Dr. Trenton Gammon - he recommended IV decadron 4 mg IV Q6h - feels patient should improve in 3-5 days, then can rechallenge orally - no need to send [facility] PEG per Dr. Trenton Gammon at this time - continue NPO status for now  Stage IV CKD - stable, following   Persistent Atrial Fibrillation  - unable to take oral metoprolol - continue IV lopressor 5 mg Q6h  - per cardiologist, not a candidate for anticoagulation  Hypothyroidism  - IV levothyroxine while NPO  Overactive Bladder - unable to resume home myrbetriq  Cervical stenosis s/p ACDF - discussed with neurosurgeon Dr. Trenton Gammon today - starting IV decadron every 6 hours - continue neck brace  - IV pain management   UTI - asymptomatic, DC IV fluconazole   DVT prophylaxis: SCDs Code Status: DNR  Family Communication: daughter updated bedside 11/8 Disposition: Status is: Inpatient Remains inpatient appropriate because: IV steroids required    Consultants:  Telephone call to neurosurgeon Dr. Trenton Gammon 11/8  Procedures:   Antimicrobials:    Subjective: Pt was having epistaxis after attempted feeding tube placement. Now improved. Pain not controlled at this time.  Objective: Vitals:   10/22/22 0402 10/22/22 0528 10/22/22 0853 10/22/22 1237  BP: (!) 156/74 Marland Kitchen)  163/82 (!) 148/78 116/80  Pulse: 80 82 75 95  Resp: 16     Temp: (!) 97 F (36.1 C)  97.8 F (36.6 C) 97.8 F (36.6 C)  TempSrc:   Oral Oral  SpO2: 97% 99% 98% 95%  Weight:      Height:        Intake/Output Summary (Last 24 hours) at 10/22/2022 1539 Last data filed at 10/22/2022 7824 Gross per 24 hour  Intake 1644.32 ml  Output 500 ml  Net 1144.32 ml   Filed Weights   10/19/22 2058  10/19/22 2137  Weight: 50.3 kg 50.8 kg   Examination:  General exam: pt in c collar, lying flat, Appears calm but Uncomfortable  Respiratory system: Clear to auscultation. Respiratory effort normal. Cardiovascular system: normal S1 & S2 heard. No JVD, murmurs, rubs, gallops or clicks. No pedal edema. Gastrointestinal system: Abdomen is nondistended, soft and nontender. No organomegaly or masses felt. Normal bowel sounds heard. Central nervous system: Alert and oriented. No focal neurological deficits. Extremities: Symmetric 5 x 5 power. Skin: No rashes, lesions or ulcers. Psychiatry: Judgement and insight appear normal. Mood & affect appropriate.   Data Reviewed: I have personally reviewed following labs and imaging studies  CBC: Recent Labs  Lab 10/19/22 2130 10/21/22 0552 10/21/22 1327 10/22/22 0508  WBC 11.4* 10.6*  --  8.9  NEUTROABS 10.6*  --   --   --   HGB 8.0* 7.0* 6.7* 9.7*  HCT 25.4* 23.0* 21.8* 30.5*  MCV 87.9 90.6  --  85.7  PLT 199 172  --  235    Basic Metabolic Panel: Recent Labs  Lab 10/19/22 2130 10/20/22 0926 10/21/22 0338 10/22/22 0508  NA 136  --  140 142  K 4.3  --  3.8 3.6  CL 107  --  112* 112*  CO2 20*  --  19* 22  GLUCOSE 116*  --  93 98  BUN 30*  --  24* 19  CREATININE 1.88*  --  1.65* 1.50*  CALCIUM 8.4*  --  8.0* 8.3*  MG  --  1.5* 2.5* 2.1  PHOS  --  3.1  --   --     CBG: Recent Labs  Lab 10/21/22 0750  GLUCAP 89    Recent Results (from the past 240 hour(s))  Surgical pcr screen     Status: None   Collection Time: 10/14/22  1:09 PM   Specimen: Nasal Mucosa; Nasal Swab  Result Value Ref Range Status   MRSA, PCR NEGATIVE NEGATIVE Final   Staphylococcus aureus NEGATIVE NEGATIVE Final    Comment: (NOTE) The Xpert SA Assay (FDA approved for NASAL specimens in patients 53 years of age and older), is one component of a comprehensive surveillance program. It is not intended to diagnose infection nor to guide or monitor  treatment. Performed at [facility], [place] [address] [place], [place] [address]   Urine Culture     Status: Abnormal   Collection Time: 10/19/22  9:10 PM   Specimen: In/Out Cath Urine  Result Value Ref Range Status   Specimen Description   Final    IN/OUT CATH URINE Performed at [facility], [address], [place], [place] [address]    Special Requests   Final    NONE Performed at [facility], [address], [place], [place] [address]    Culture 6,000 COLONIES/mL YEAST (A)  Final   Report Status 10/21/2022 FINAL  Final  Culture, blood (Routine x 2)  Status: None (Preliminary result)   Collection Time: 10/19/22  9:30 PM   Specimen: BLOOD LEFT FOREARM  Result Value Ref Range Status   Specimen Description BLOOD LEFT FOREARM  Final   Special Requests   Final    BOTTLES DRAWN AEROBIC AND ANAEROBIC Blood Culture adequate volume   Culture   Final    NO GROWTH 3 DAYS Performed at [facility], [address], [place], [place] [address]    Report Status PENDING  Incomplete  Culture, blood (Routine x 2)     Status: None (Preliminary result)   Collection Time: 10/19/22  9:54 PM   Specimen: BLOOD LEFT HAND  Result Value Ref Range Status   Specimen Description BLOOD LEFT HAND  Final   Special Requests   Final    BOTTLES DRAWN AEROBIC ONLY Blood Culture adequate volume   Culture   Final    NO GROWTH 3 DAYS Performed at [facility], [address], [place], [place] [address]    Report Status PENDING  Incomplete  Resp Panel by RT-PCR (Flu A&B, Covid) Anterior Nasal Swab     Status: None   Collection Time: 10/19/22 10:14 PM   Specimen: Anterior Nasal Swab  Result Value Ref Range Status   SARS Coronavirus 2 by RT PCR NEGATIVE NEGATIVE Final    Comment: (NOTE) SARS-CoV-2 target nucleic acids are NOT DETECTED.  The SARS-CoV-2 RNA is generally detectable in upper respiratory specimens during the acute phase of infection. The lowest concentration of SARS-CoV-2 viral copies  this assay can detect is 138 copies/mL. A negative result does not preclude SARS-Cov-2 infection and should not be used as the sole basis for treatment or other patient management decisions. A negative result may occur with  improper specimen collection/handling, submission of specimen other than nasopharyngeal swab, presence of viral mutation(s) within the areas targeted by this assay, and inadequate number of viral copies(<138 copies/mL). A negative result must be combined with clinical observations, patient history, and epidemiological information. The expected result is Negative.  Fact Sheet for Patients:  EntrepreneurPulse.com.au  Fact Sheet for Healthcare Providers:  IncredibleEmployment.be  This test is no t yet approved or cleared by the [place] FDA and  has been authorized for detection and/or diagnosis of SARS-CoV-2 by FDA under an Emergency Use Authorization (EUA). This EUA will remain  in effect (meaning this test can be used) for the duration of the COVID-19 declaration under Section 564(b)(1) of the Act, 21 U.S.C.section 360bbb-3(b)(1), unless the authorization is terminated  or revoked sooner.       Influenza A by PCR NEGATIVE NEGATIVE Final   Influenza B by PCR NEGATIVE NEGATIVE Final    Comment: (NOTE) The Xpert Xpress SARS-CoV-2/FLU/RSV plus assay is intended as an aid in the diagnosis of influenza from Nasopharyngeal swab specimens and should not be used as a sole basis for treatment. Nasal washings and aspirates are unacceptable for Xpert Xpress SARS-CoV-2/FLU/RSV testing.  Fact Sheet for Patients: EntrepreneurPulse.com.au  Fact Sheet for Healthcare Providers: IncredibleEmployment.be  This test is not yet approved or cleared by the [place] FDA and has been authorized for detection and/or diagnosis of SARS-CoV-2 by FDA under an Emergency Use Authorization (EUA). This EUA  will remain in effect (meaning this test can be used) for the duration of the COVID-19 declaration under Section 564(b)(1) of the Act, 21 U.S.C. section 360bbb-3(b)(1), unless the authorization is terminated or revoked.  Performed at [facility], [address] [place], [place] [address]  Radiology Studies: DG Loyce Dys Tube Plc W/Fl W/Rad  Result Date: 10/22/2022 CLINICAL DATA:  Feeding tube placement EXAM: NASO G TUBE PLACEMENT WITH FL AND WITH RAD CONTRAST:  None utilized FLUOROSCOPY: Fluoroscopy Time:  1 minute 6 seconds Radiation Exposure Index (if provided by the fluoroscopic device): 17.00 mGy Number of Acquired Spot Images: 1 COMPARISON:  None Available. FINDINGS: Attempted placement of feeding tube. Immediate epistaxis with introduction of the tube, potentially from attempts to place the tube yesterday; bleeding controlled by nasal pressure. Multiple times the feeding tube was placed into the trachea with a spontaneous cough reflex. Tube was removed. Despite repeated swallowing, the esophagus could not be intubated. Patient unable to flex chin to chest to aid in placement due to recent cervical spine surgery and presence of soft cervical collar. IMPRESSION: Unsuccessful attempts to place feeding tube. Procedure and epistaxis discussed with Dr. Wynetta Emery at conclusion of procedure. Electronically Signed   By: Lavonia Dana M.D.   On: 10/22/2022 12:02   DG Swallowing Func-Speech Pathology  Result Date: 10/21/2022 Table formatting from the original result was not included. MBSS at [facility], inpatient:  10/21/22 1200 SLP Visit Information SLP Received On 10/21/22 General Information Jane Andrews is a 88 y.o. female with medical history significant of hypothyroidism, overactive bladder, anxiety, depression, spondylosis with severe stenosis at C3-4 and C4-5 s/p  C3-4, C4-5 anterior cervical discectomy and fusion (10/17/2022) who presents to the emergency department via EMS due to  difficulty breathing on 10/19/22.  Patient was lying in bed with neck brace and most of the history was obtained from ED physician and niece at the bedside.  Per report, patient presented with cough and congestion a few days ago, her physician was contacted and recommended giving Mucinex, patient's aide called the niece this afternoon that patient was vomiting, niece activated EMS and on arrival of EMS team, she was noted to be hypoxic with an O2 sat of 84% on room air, NRB at 15 L was provided with improvement in oxygenation to 98%.  Patient was tachycardic at rate of 136, she was febrile with a temperature of 102F (orally).  Code sepsis was activated en route to the ED.;CXR indicated on 10/19/22 potential Aspiration PNA or B PNA.BSEcompleted this AM and MBSS recommended. Caregiver present No Diet Prior to this Study NPO Temperature  Normal Respiratory Status WFL Supplemental O2 Nasal cannula History of Recent Intubation Yes Length of Intubations (days) 1 days Date extubated 10/17/22 Number of Intubations 1 Self extubation  No Behavior/Cognition Alert;Cooperative Self-Feeding Abilities Able to self-feed Baseline vocal quality/speech Normal Volitional Cough Able to elicit Volitional Cough Assessment Appears WFL Volitional Swallow Able to elicit Anatomy Presence of cervical hardware (posterior pharyngeal wall swelling s/p ACDF surgery on Friday) Orofacial Exam Oral Cavity Assessment WFL Oral Cavity - Dentition Adequate natural dentition Orofacial Anatomy WFL Oral Motor/Sensory Function WFL Thin Liquids (Level 0) Thin Liquids  Impaired Bolus delivery method Spoon Thin Liquid - Impairment Pharyngeal impairment Initiation of swallow  Valleculae Soft palate elevation Complete Tongue base retraction Wide column of contrast or air between tongue base and PPW Laryngeal elevation Complete superior movement of thyroid cartilage with complete approximation of arytenoids to epiglottic petiole Anterior hyoid excursion Complete  Epiglottic movement Partial Laryngeal vestibule closure Incomplete Pharyngeal stripping wave  Present - diminished Pharyngeal contraction (A/P view only) N/A Pharyngoesophageal segment opening Partial distention/partial duration, partial obstruction of flow Pharyngeal residue Collection of residue within or on pharyngeal structures Location of pharyngeal residue Valleculae;Pharyngeal wall;Pyriform  sinuses Penetration/Aspiration Scale (PAS) score 7.  Material enters airway, passes BELOW cords and not ejected out despite cough attempt by patient Mildly thick liquids (Level 2, nectar thick) Mildly thick liquids (Level 2, nectar thick) Impaired Bolus delivery method Spoon Mildly Thick Liquid - Impairment Pharyngeal impairment Initiation of swallow  Valleculae Soft palate elevation Complete Tongue base retraction Wide column of contrast or air between tongue base and PPW Laryngeal elevation Complete superior movement of thyroid cartilage with complete approximation of arytenoids to epiglottic petiole Anterior hyoid excursion Complete Epiglottic movement Partial Laryngeal vestibule closure Incomplete Pharyngeal stripping wave  Present - diminished Pharyngeal contraction (A/P view only) N/A Pharyngoesophageal segment opening Partial distention/partial duration, partial obstruction of flow Pharyngeal residue Collection of residue within or on pharyngeal structures Location of pharyngeal residue Valleculae;Pharyngeal wall;Pyriform sinuses Penetration/Aspiration Scale (PAS) score 7.  Material enters airway, passes BELOW cords and not ejected out despite cough attempt by patient Moderately thick liquids (Level 3, honey thick) Moderately thick liquids (Level 3, honey thick) Impaired Moderately Thick Liquid - Impairment Pharyngeal impairment Initiation of swallow  Valleculae Soft palate elevation Complete Tongue base retraction Wide column of contrast or air between tongue base and PPW Laryngeal elevation Complete superior  movement of thyroid cartilage with complete approximation of arytenoids to epiglottic petiole Anterior hyoid excursion Complete Epiglottic movement Partial Laryngeal vestibule closure Incomplete Pharyngeal stripping wave  Present - diminished Pharyngeal contraction (A/P view only) N/A Pharyngoesophageal segment opening Partial distention/partial duration, partial obstruction of flow Pharyngeal residue Collection of residue within or on pharyngeal structures Location of pharyngeal residue Valleculae;Pharyngeal wall;Pyriform sinuses Penetration/Aspiration Scale (PAS) score 7.  Material enters airway, passes BELOW cords and not ejected out despite cough attempt by patient Puree Puree Impaired Puree - Impairment Pharyngeal impairment Initiation of swallow Valleculae Soft palate elevation Complete Tongue base retraction Wide column of contrast or air between tongue base and PPW Laryngeal elevation Complete superior movement of thyroid cartilage with complete approximation of arytenoids to epiglottic petiole Anterior hyoid excursion Complete Epiglottic movement Partial Laryngeal vestibule closure Incomplete Pharyngeal stripping wave  Present - diminished Pharyngeal contraction (A/P view only) N/A Pharyngoesophageal segment opening Partial distention/partial duration, partial obstruction of flow Pharyngeal residue Collection of residue within or on pharyngeal structures;Majority of contrast within or on pharyngeal structures Location of pharyngeal residue Valleculae;Pharyngeal wall;Pyriform sinuses Penetration/Aspiration Scale (PAS) score 7.  Material enters airway, passes BELOW cords and not ejected out despite cough attempt by patient Solid Solid Not Tested Pill Pill Not Tested Compensatory Strategies Compensatory strategies Yes Effortful swallow Ineffective Multiple swallows Effective Effective Multiple Swallows Thin liquid (Level 0);Mildly thick liquid (Level 2, nectar thick);Moderately thick liquid (Level 3, honey  thick);Puree Chin tuck Ineffective Left head turn Ineffective Right head turn Ineffective Clinical Impression Clinical Impression Pt presents with moderate/severe pharyngeal phase dysphagia characterized by vallecular trigger with tsp thin, NTL, HTL, and puree, posterior pharyngeal wall edema negatively impacting epiglottic deflection and pharyngeal constriction resulting in penetration during the swallow, aspiration after the swallow, and pharyngeal residue. Pt with episodes of gross aspiration after the swallow from the residuals which was sensed and mostly removed, but then unable to completely clear pharynx. Head turns left and right were attempted, but found to be ineffective. Pt with limited head movement due to soft neck brace and discomfort for chin down. Recommend NPO with alternative means of nutrition via NG in the hopes that swelling will decrease over the next 10-14 days. Pt has a very strong cough, however she is unable to completely clear  all aspirates and then unable to completely clear pharynx at this time. Consider consultation with Dr. Trenton Gammon, who did the surgery to see if steroids could be beneficial during this stage of her recovery. SLP to follow and can administer single ice chips for comfort and to help prevent muscle disuse atrophy (SLP for puree trials to assess for readiness for repeat instrumental assessment). RN to complete oral care and have suction at bedside. Pt was shown how to self use the [place] suction and encouraged to cough and clear. SLP will follow.  SLP Visit Diagnosis Dysphagia, pharyngeal phase (R13.13);Dysphagia, pharyngoesophageal phase (R13.14) Factors that may increase risk of adverse event in presence of aspiration ([place] 2021) Limited mobility;Frail or deconditioned;Frequent aspiration of large volumes Swallowing Evaluation Recommendations Recommendations NPO;Alternative means of nutrition - NG Tube Medication Administration Via alternative means Swallowing  strategies   (trials puree with SLP only) Postural changes Position pt fully upright for meals;Stay upright 30-60 min after meals Oral care recommendations Oral care QID (4x/day);Staff/trained caregiver to provide oral care Recommended consults Consider dietitian consultation Treatment Plan Treatment recommendations Therapy as outlined in treatment plan below Follow-up recommendations Skilled nursing-short term rehab (<3 hours/day) Assistance recommended at discharge Frequent or constant supervision/assistance Functional status assessment Patient has had a recent decline in their functional status and demonstrates the ability to make significant improvements in function in a reasonable and predictable amount of time. Treatment frequency Min 2x/week Treatment duration 1 week Interventions Aspiration precaution training;Trials of upgraded texture/liquids;Patient/family education Goal Planning Prognosis for Safe Diet Advancement Fair Barriers to Reach Goals Severity of deficits Patient/Family Stated Goal Swallow safely Consulted and agree with results and recommendations Patient;Physician SLP Time Calculation SLP Start Time (ACUTE ONLY) 1151 SLP Stop Time (ACUTE ONLY) 1223 SLP Time Calculation (min) (ACUTE ONLY) 32 min SLP Evaluations $ SLP Speech Visit 1 Visit SLP Evaluations $MBS Swallow 1 Procedure Thank you, Genene Churn, [place]   [facility] Chest Port 1V same Day  Result Date: 10/21/2022 CLINICAL DATA:  Follow-up malpositioned NG tube. EXAM: PORTABLE CHEST 1 VIEW COMPARISON:  Earlier chest x-ray, same day. FINDINGS: The NG tube has been removed. No evidence of a right-sided pneumothorax. The left PICC line is stable. Persistent bibasilar atelectasis or infiltrates and small left pleural effusion. IMPRESSION: 1. Removal of malpositioned NG tube.  No right-sided pneumothorax. 2. Persistent bibasilar atelectasis or infiltrates and small left pleural effusion. Electronically Signed   By: Marijo Sanes M.D.    On: 10/21/2022 14:33   DG CHEST PORT 1 VIEW  Addendum Date: 10/21/2022   ADDENDUM REPORT: 10/21/2022 14:09 ADDENDUM: The original report was by Dr. Van Clines. The following addendum is by Dr. Van Clines: Critical Value/emergent results were called by telephone at the time of interpretation on 10/21/2022 at 2:05 pm to provider [facility] , who verbally acknowledged these results. Electronically Signed   By: Van Clines M.D.   On: 10/21/2022 14:09   Result Date: 10/21/2022 CLINICAL DATA:  Nasogastric tube placement. EXAM: PORTABLE CHEST 1 VIEW COMPARISON:  10/19/2022 FINDINGS: The nasogastric tube tip projects over the right lower lobe medially, and is fairly peripheral in the right lung. This tube is malposition. There is some continued airspace opacities in both lung bases along with blunting of the left costophrenic angle. Borderline enlargement of the cardiopericardial silhouette. Atherosclerotic calcification of the aortic arch. No current pneumothorax. Dextroconvex thoracolumbar scoliosis. Left upper quadrant density likely from contrast medium in the stomach. Left-sided PICC line tip: Cavoatrial junction. IMPRESSION: 1. Malpositioned  nasogastric tube with the tip projecting peripherally over the right lower lobe medially. No currently visible pneumothorax; removal recommended with immediate post removal chest radiograph to rule out post removal pneumothorax prior to attempts at replacement. Close clinical observation of the patient is recommended following removal of the tube until chest radiography can confirm the lack of a post-removal pneumothorax. 2. Continued airspace opacities in both lung bases with blunting of the left costophrenic angle. 3. Borderline enlargement of the cardiopericardial silhouette. 4. Left upper quadrant density likely from contrast medium in the stomach. 5. Dextroconvex thoracolumbar scoliosis. Radiology assistant personnel have been notified to put me in  telephone contact with the referring physician or the referring physician's clinical representative in order to discuss these findings. Once this communication is established I will issue an addendum to this report for documentation purposes. Electronically Signed: By: Van Clines M.D. On: 10/21/2022 14:03    Scheduled Meds:  ALPRAZolam  1 mg Oral QHS   Chlorhexidine Gluconate Cloth  6 each Topical Daily   dexamethasone (DECADRON) injection  4 mg Intravenous Q6H   dextromethorphan-guaiFENesin  1 tablet Oral BID   DULoxetine  60 mg Oral q morning   feeding supplement  237 mL Oral BID BM   gabapentin  100 mg Oral BID   gabapentin  200 mg Oral QHS   levothyroxine  75 mcg Oral QAC breakfast   metoprolol tartrate  5 mg Intravenous Q6H   mirabegron ER  50 mg Oral Daily   sodium chloride flush  10-40 mL Intracatheter Q12H   Continuous Infusions:  sodium chloride 10 mL/hr at 10/21/22 2213   ampicillin-sulbactam (UNASYN) IV 3 g (10/22/22 0853)   dextrose 5% lactated ringers Stopped (10/21/22 1708)   doxycycline (VIBRAMYCIN) IV 100 mg (10/22/22 1202)   ferric gluconate (FERRLECIT) IVPB 250 mg (10/22/22 1434)   fluconazole (DIFLUCAN) IV 100 mg (10/22/22 0058)     LOS: 3 days   Time spent: 36 mins  Auria Mckinlay Wynetta Emery, MD How to contact the [facility] Attending or Consulting provider [place] or covering provider during after hours [place], for this patient?  Check the care team in [facility] and look for a) attending/consulting [facility] provider listed and b) the [facility] team listed Log into [email or web address] and use [place] universal password to access. If you do not have the password, please contact the hospital operator. Locate the [facility] provider you are looking for under [facility] and page to a number that you can be directly reached. If you still have difficulty reaching the provider, please page the [facility] (Director on Call) for the Hospitalists listed on amion for assistance.  10/22/2022, 3:39 PM

## 2022-10-22 NOTE — Progress Notes (Signed)
10/22/2022 12:35 PM  I just spoke with patient's neurosurgeon Dr. Trenton Gammon and he says that he reviewed the swallowing studies, etc and he recommended we do a course of IV decadron to help improve the edema and he anticipates she should improve in 3-5 days, can try placing cortrak after 72 hours if needed, but no need to transfer to [facility] for a PEG tube at this time.    He recommended decadron 4 mg IV Q6 hours.   Murvin Natal, MD

## 2022-10-22 NOTE — Progress Notes (Signed)
Patient finished receiving 1 unit of blood with this nurse and received 2nd unit of blood with out any complications. Hydromorphone '05mg'$  IV given twice this shift. Patient slept through the night. Continued to monitor patient.   Patients sister called five times, wanting an in depth report of patients condition. Informed her that She is not on the contact list and would need to be on it in order to receive information, per HIPAA.

## 2022-10-22 NOTE — Progress Notes (Signed)
Speech Language Pathology Treatment: Dysphagia  Patient Details Name: Jane Andrews MRN: [id number] DOB: 1925/10/30 Today's Date: 10/22/2022 Time: 9150-5697 SLP Time Calculation (min) (ACUTE ONLY): 31 min  Assessment / Plan / Recommendation Clinical Impression  Pt seen for dysphagia intervention following MBSS completed yesterday. Feeding tube could not be placed in radiology today. SLP provided oral care immediately after being seen in radiology. Blood tinged secretions suctioned. SLP showed Pt how to use suction and Pt assisted in providing self oral care. Pt continues to presents with a strong cough and when cued to "[facility]" and then suction, copious secretions removed. Pt confirmed acceptance of ice chip for comfort which resulted in suspected delayed multiple swallows and throat clearing. No additional trials given at this time given ongoing blood tinged secretions from recent feeding tube placement attempt. Pt may benefit from transfer to [facility] for coretrack and/or possible PEG if SNF will not accept with NG in situ. Pt will need ongoing swallow intervention and repeat instrumental assessment with hopeful reduction in swelling over the next few days/weeks. Above discussed with family, RN, and Dr. Wynetta Emery.    HPI HPI: Jane Andrews is a 88 y.o. 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  Patient was lying in bed with neck brace and most of the history was obtained from ED physician and niece at the bedside.  Per report, patient presented with cough and congestion a few days ago, her physician was contacted and recommended giving Mucinex, patient's aide called the niece this afternoon that patient was vomiting, niece activated EMS  and on arrival of EMS team, she was noted to be hypoxic with an O2 sat of 84% on room air, NRB at 15 L was provided with improvement in oxygenation to 98%.  Patient was tachycardic at rate of 136, she was febrile with a temperature of 102F (orally).  Code sepsis was activated en route to the ED.;CXR indicated on 10/19/22 potential Aspiration PNA or B PNA.BSEcompleted this AM and MBSS recommended.      SLP Plan  Continue with current plan of care      Recommendations for follow up therapy are one component of a multi-disciplinary discharge planning process, led by the attending physician.  Recommendations may be updated based on patient status, additional functional criteria and insurance authorization.    Recommendations  Diet recommendations: NPO Medication Administration: Via alternative means                Oral Care Recommendations: Oral care prior to ice chip/H20;Oral care QID;Staff/trained caregiver to provide oral care Follow Up Recommendations: Skilled nursing-short term rehab (<3 hours/day) Assistance recommended at discharge: Frequent or constant Supervision/Assistance SLP Visit Diagnosis: Dysphagia, pharyngeal phase (R13.13);Dysphagia, pharyngoesophageal phase (R13.14) Plan: Continue with current plan of care        Thank you,  Genene Churn, [place]    [place]  10/22/2022, 12:14 PM

## 2022-10-23 DIAGNOSIS — J189 Pneumonia, unspecified organism: Secondary | ICD-10-CM | POA: Diagnosis not present

## 2022-10-23 DIAGNOSIS — E8809 Other disorders of plasma-protein metabolism, not elsewhere classified: Secondary | ICD-10-CM | POA: Diagnosis not present

## 2022-10-23 DIAGNOSIS — E46 Unspecified protein-calorie malnutrition: Secondary | ICD-10-CM | POA: Diagnosis not present

## 2022-10-23 DIAGNOSIS — I480 Paroxysmal atrial fibrillation: Secondary | ICD-10-CM | POA: Diagnosis not present

## 2022-10-23 DIAGNOSIS — F419 Anxiety disorder, unspecified: Secondary | ICD-10-CM | POA: Diagnosis not present

## 2022-10-23 LAB — CBC
HCT: 32.4 % — ABNORMAL LOW (ref 36.0–46.0)
Hemoglobin: 10.3 g/dL — ABNORMAL LOW (ref 12.0–15.0)
MCH: 27.4 pg (ref 26.0–34.0)
MCHC: 31.8 g/dL (ref 30.0–36.0)
MCV: 86.2 fL (ref 80.0–100.0)
Platelets: 159 10*3/uL (ref 150–400)
RBC: 3.76 MIL/uL — ABNORMAL LOW (ref 3.87–5.11)
RDW: 16.1 % — ABNORMAL HIGH (ref 11.5–15.5)
WBC: 5.8 10*3/uL (ref 4.0–10.5)
nRBC: 0.3 % — ABNORMAL HIGH (ref 0.0–0.2)

## 2022-10-23 LAB — COMPREHENSIVE METABOLIC PANEL
ALT: 9 U/L (ref 0–44)
AST: 14 U/L — ABNORMAL LOW (ref 15–41)
Albumin: 2.7 g/dL — ABNORMAL LOW (ref 3.5–5.0)
Alkaline Phosphatase: 70 U/L (ref 38–126)
Anion gap: 13 (ref 5–15)
BUN: 25 mg/dL — ABNORMAL HIGH (ref 8–23)
CO2: 18 mmol/L — ABNORMAL LOW (ref 22–32)
Calcium: 8.6 mg/dL — ABNORMAL LOW (ref 8.9–10.3)
Chloride: 114 mmol/L — ABNORMAL HIGH (ref 98–111)
Creatinine, Ser: 1.54 mg/dL — ABNORMAL HIGH (ref 0.44–1.00)
GFR, Estimated: 31 mL/min — ABNORMAL LOW (ref >60)
Glucose, Bld: 116 mg/dL — ABNORMAL HIGH (ref 70–99)
Potassium: 3.5 mmol/L (ref 3.5–5.1)
Sodium: 145 mmol/L (ref 135–145)
Total Bilirubin: 1.1 mg/dL (ref 0.3–1.2)
Total Protein: 6.2 g/dL — ABNORMAL LOW (ref 6.5–8.1)

## 2022-10-23 LAB — MAGNESIUM: Magnesium: 2.1 mg/dL (ref 1.7–2.4)

## 2022-10-23 MED ORDER — LORAZEPAM 2 MG/ML IJ SOLN
1.0000 mg | Freq: Four times a day (QID) | INTRAMUSCULAR | Status: DC | PRN
  Administered 2022-10-24 (×2): 1 mg via INTRAVENOUS
  Filled 2022-10-23: qty 1

## 2022-10-23 MED ORDER — METOPROLOL TARTRATE 5 MG/5ML IV SOLN
7.5000 mg | Freq: Four times a day (QID) | INTRAVENOUS | Status: DC
  Administered 2022-10-24 (×2): 7.5 mg via INTRAVENOUS
  Filled 2022-10-23 (×4): qty 10

## 2022-10-23 MED ORDER — LORAZEPAM 2 MG/ML IJ SOLN
0.5000 mg | Freq: Four times a day (QID) | INTRAMUSCULAR | Status: DC | PRN
  Administered 2022-10-23: 0.5 mg via INTRAVENOUS
  Filled 2022-10-23: qty 1

## 2022-10-23 MED ORDER — HYDRALAZINE HCL 20 MG/ML IJ SOLN
2.0000 mg | Freq: Four times a day (QID) | INTRAMUSCULAR | Status: DC
  Administered 2022-10-23 – 2022-10-24 (×7): 2 mg via INTRAVENOUS
  Filled 2022-10-23 (×8): qty 1

## 2022-10-23 MED ORDER — METOPROLOL TARTRATE 5 MG/5ML IV SOLN
5.0000 mg | Freq: Four times a day (QID) | INTRAVENOUS | Status: DC
  Administered 2022-10-23: 5 mg via INTRAVENOUS
  Filled 2022-10-23: qty 5

## 2022-10-23 MED ORDER — LORAZEPAM 2 MG/ML IJ SOLN: 0.5000 mg | INTRAMUSCULAR | Status: DC | PRN

## 2022-10-23 NOTE — Progress Notes (Signed)
   10/23/22 1710  Assess: MEWS Score  BP (!) 141/76  MAP (mmHg) 94  Pulse Rate (!) 135  Resp 16  Level of Consciousness Alert  SpO2 99 %  O2 Device Nasal Cannula  O2 Flow Rate (L/min) 2 L/min  Assess: MEWS Score  MEWS Temp 0  MEWS Systolic 0  MEWS Pulse 3  MEWS RR 0  MEWS LOC 0  MEWS Score 3  MEWS Score Color Yellow  Assess: if the MEWS score is Yellow or Red  Were vital signs taken at a resting state? Yes  Focused Assessment Change from prior assessment (see assessment flowsheet)  MEWS guidelines implemented *See Row Information* Yes  Treat  MEWS Interventions Administered scheduled meds/treatments  Take Vital Signs  Increase Vital Sign Frequency  Yellow: Q 2hr X 2 then Q 4hr X 2, if remains yellow, continue Q 4hrs  Escalate  MEWS: Escalate Yellow: discuss with charge nurse/RN and consider discussing with provider and RRT  Notify: Charge Nurse/RN  Name of Charge Nurse/RN Notified Lowry Ram, RN  Date Charge Nurse/RN Notified 10/23/22  Time Charge Nurse/RN Notified 1712  Assess: SIRS CRITERIA  SIRS Temperature  0  SIRS Pulse 1  SIRS Respirations  0  SIRS WBC 1  SIRS Score Sum  2

## 2022-10-23 NOTE — Progress Notes (Signed)
SLP Cancellation Note  Patient Details Name: Jane Andrews MRN: [id number] DOB: 1925-12-03   Cancelled treatment:       Reason Eval/Treat Not Completed: Other (comment) (Pt sleeping soundly and SLP did not want to awaken her at this time. Will check back later this afternoon to offer oral care and ice chips if appropriate. Pt will likely need repeat MBSS in the next few days (Monday?) if she appears clinically improved.)  Thank you,  Genene Churn, [place]  [place] 10/23/2022, 11:57 AM

## 2022-10-23 NOTE — Progress Notes (Signed)
Patient slept through the night only waking for medications and to be repositioned.  Did suction patient one time, red tinged sputum. Patient requesting deep suction, Respiratory stated it is contraindicated due to current bleeding and trauma to throat. Continued to monitor this shift.

## 2022-10-23 NOTE — Progress Notes (Signed)
   Subjective:  Patient is feeling much better. Per nursing, patient suctioned patient on time extracting red tinged sputum. Patient asked for deep suction but RT stated this was contraindicated due to current bleeding/throat trauma.  Objective:  Vital signs in last 24 hours: Vitals:   10/23/22 0035 10/23/22 0351 10/23/22 0600 10/23/22 1219  BP: (!) 145/64 (!) 168/87 (!) 158/75 (!) 168/80  Pulse: 65 71 73 88  Resp: '16  16 17  '$ Temp:  98.2 F (36.8 C)    TempSrc:  Oral    SpO2: 100% 100% 100% 99%  Weight:      Height:       Weight change:   Intake/Output Summary (Last 24 hours) at 10/23/2022 1232 Last data filed at 10/23/2022 8416 Gross per 24 hour  Intake 695.78 ml  Output 1000 ml  Net -304.22 ml   Physical Examination   General: lying in bed with neck brace, in no acute distress Head: normocephalic, atraumatic Cardiovascular: regular rate and rhythm, systolic murmur, without rubs, or gallops, no LEE Respiratory: normal respiratory effort, bilateral basilar rhonchi, improved air movement Abdominal: normoactive bowel sounds, no tenderness to palpation Skin: warm, dry Central Nervous System: alert and oriented x 3 Psychiatric: Normal mood and affect   Assessment/Plan:  Principal Problem:   CAP (community acquired pneumonia) Active Problems:   CKD (chronic kidney disease) stage 4, GFR 15-29 ml/min (HCC)   Acquired hypothyroidism   Atrial fibrillation (HCC)   Normocytic anemia   Acute respiratory failure with hypoxia (HCC)   Overactive bladder   Hypoalbuminemia due to protein-calorie malnutrition (HCC)   Anxiety   Depression   Cervical stenosis of spinal canal   ARFH / Aspiration pneumonia Afebrile without leukocytosis. - IV Unasyn will be completed today. - supportive measures   Dysphagia  - postop complication of ACDF - discussed with neurosurgeon Dr. Trenton Gammon - he recommended IV decadron 4 mg IV Q6h - feels patient should improve in 3-5 days, then can  rechallenge orally - no need to send to [facility] for PEG per Dr. Trenton Gammon at this time - continue NPO status for now -SLP following and will plan on repeat MBSS if stable   Stage IV CKD - stable, following    Persistent Atrial Fibrillation  - unable to take oral metoprolol - continue IV lopressor 5 mg Q6h  - per cardiologist, not a candidate for anticoagulation   Hypothyroidism  - IV levothyroxine while NPO   Overactive Bladder - unable to resume home myrbetriq   Cervical stenosis s/p ACDF - discussed with neurosurgeon Dr. Trenton Gammon - starting IV decadron every 6 hours - continue neck brace  - IV pain management    UTI - asymptomatic, DC IV fluconazole   Hypertension Patient with SBP consistently in upper 160's. Likely secondary to pain and withdrawal from home regimen of Xanax. -IV Hydralazine ordered -IV Ativan ordered -Continue to monitor   LOS: 4 days   Stanford Breed, Medical Student 10/23/2022, 12:32 PM

## 2022-10-23 NOTE — Progress Notes (Signed)
CCMD called to inform this nurse that pt's HR has been in 130's and has gotten as high as 150's. MD notified. Will continue to monitor pt.

## 2022-10-24 LAB — PROCALCITONIN: Procalcitonin: 3.16 ng/mL

## 2022-10-24 LAB — GLUCOSE, CAPILLARY
Glucose-Capillary: 189 mg/dL — ABNORMAL HIGH (ref 70–99)
Glucose-Capillary: 182 mg/dL — ABNORMAL HIGH (ref 70–99)
Glucose-Capillary: 137 mg/dL — ABNORMAL HIGH (ref 70–99)

## 2022-10-24 LAB — CULTURE, BLOOD (ROUTINE X 2)
Culture: NO GROWTH
Culture: NO GROWTH
Special Requests: ADEQUATE
Special Requests: ADEQUATE

## 2022-10-24 LAB — HEMOGLOBIN A1C
Hgb A1c MFr Bld: 5.4 % (ref 4.8–5.6)
Mean Plasma Glucose: 108.28 mg/dL

## 2022-10-24 LAB — COMPREHENSIVE METABOLIC PANEL
ALT: 10 U/L (ref 0–44)
AST: 15 U/L (ref 15–41)
Albumin: 3.3 g/dL — ABNORMAL LOW (ref 3.5–5.0)
Alkaline Phosphatase: 72 U/L (ref 38–126)
Anion gap: 13 (ref 5–15)
BUN: 31 mg/dL — ABNORMAL HIGH (ref 8–23)
CO2: 19 mmol/L — ABNORMAL LOW (ref 22–32)
Calcium: 9.1 mg/dL (ref 8.9–10.3)
Chloride: 115 mmol/L — ABNORMAL HIGH (ref 98–111)
Creatinine, Ser: 1.47 mg/dL — ABNORMAL HIGH (ref 0.44–1.00)
GFR, Estimated: 33 mL/min — ABNORMAL LOW (ref >60)
Glucose, Bld: 222 mg/dL — ABNORMAL HIGH (ref 70–99)
Potassium: 3.3 mmol/L — ABNORMAL LOW (ref 3.5–5.1)
Sodium: 147 mmol/L — ABNORMAL HIGH (ref 135–145)
Total Bilirubin: 0.8 mg/dL (ref 0.3–1.2)
Total Protein: 6.8 g/dL (ref 6.5–8.1)

## 2022-10-24 LAB — MAGNESIUM: Magnesium: 2 mg/dL (ref 1.7–2.4)

## 2022-10-24 LAB — CBC
HCT: 34.2 % — ABNORMAL LOW (ref 36.0–46.0)
Hemoglobin: 11.1 g/dL — ABNORMAL LOW (ref 12.0–15.0)
MCH: 27.5 pg (ref 26.0–34.0)
MCHC: 32.5 g/dL (ref 30.0–36.0)
MCV: 84.7 fL (ref 80.0–100.0)
Platelets: 268 10*3/uL (ref 150–400)
RBC: 4.04 MIL/uL (ref 3.87–5.11)
RDW: 16.3 % — ABNORMAL HIGH (ref 11.5–15.5)
WBC: 11.4 10*3/uL — ABNORMAL HIGH (ref 4.0–10.5)
nRBC: 0.9 % — ABNORMAL HIGH (ref 0.0–0.2)

## 2022-10-24 MED ORDER — LORAZEPAM 2 MG/ML IJ SOLN
0.5000 mg | Freq: Four times a day (QID) | INTRAMUSCULAR | Status: DC | PRN
  Administered 2022-10-24 – 2022-10-31 (×8): 0.5 mg via INTRAVENOUS
  Filled 2022-10-24 (×8): qty 1

## 2022-10-24 MED ORDER — INSULIN ASPART 100 UNIT/ML IJ SOLN
0.0000 [IU] | INTRAMUSCULAR | Status: DC
  Administered 2022-10-24 (×2): 2 [IU] via SUBCUTANEOUS
  Administered 2022-10-24 – 2022-10-25 (×2): 1 [IU] via SUBCUTANEOUS
  Administered 2022-10-25 (×2): 2 [IU] via SUBCUTANEOUS
  Administered 2022-10-25: 1 [IU] via SUBCUTANEOUS
  Administered 2022-10-25 (×2): 2 [IU] via SUBCUTANEOUS
  Administered 2022-10-26: 1 [IU] via SUBCUTANEOUS
  Administered 2022-10-26 (×2): 2 [IU] via SUBCUTANEOUS
  Administered 2022-10-26 – 2022-10-27 (×4): 1 [IU] via SUBCUTANEOUS

## 2022-10-24 MED ORDER — METOPROLOL TARTRATE 5 MG/5ML IV SOLN
10.0000 mg | Freq: Four times a day (QID) | INTRAVENOUS | Status: DC
  Administered 2022-10-24 – 2022-10-25 (×5): 10 mg via INTRAVENOUS
  Filled 2022-10-24 (×5): qty 10

## 2022-10-24 MED ORDER — METOPROLOL TARTRATE 5 MG/5ML IV SOLN
5.0000 mg | Freq: Once | INTRAVENOUS | Status: AC
  Administered 2022-10-24: 5 mg via INTRAVENOUS
  Filled 2022-10-24: qty 5

## 2022-10-24 MED ORDER — HYDRALAZINE HCL 20 MG/ML IJ SOLN
5.0000 mg | Freq: Four times a day (QID) | INTRAMUSCULAR | Status: DC
  Administered 2022-10-25 (×2): 5 mg via INTRAVENOUS
  Filled 2022-10-24 (×2): qty 1

## 2022-10-24 MED ORDER — LACTATED RINGERS IV SOLN: INTRAVENOUS | Status: DC

## 2022-10-24 NOTE — Progress Notes (Signed)
Speech Language Pathology Treatment: Dysphagia  Patient Details Name: Jane Andrews MRN: [id number] DOB: 21-Feb-1925 Today's Date: 10/24/2022 Time: 1250-1310 SLP Time Calculation (min) (ACUTE ONLY): 20 min  Assessment / Plan / Recommendation Clinical Impression  Pt seen for dysphagia f/u tx session with aggressive oral care completed to moisten oral mucosa which appeared dry/cracked and residue noted on tongue/xerostomia with oral care completed improved overall hydration of oral cavity/structures prior to PO trials of ice chips.  Pt administered ice chips with symptoms including multiple swallows, delayed throat clearing x1 and delay in the initiation of the swallow observed.  Pt exhibited an immediate cough during last tx session with ice chip presentation, so improvement noted this session with min verbal cueing given during ice chip administration.  Educated pt/family re: aspiration risk/dysphagic symptoms and goal of ST to continue PO readiness until pt improvement noted with swallowing function while in acute setting.     HPI HPI: Jane Andrews is a 88 y.o. 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 Patient was lying in bed with neck brace and most of the history was obtained from ED physician and niece at the bedside. Per report, patient presented with cough and congestion a few days ago, her physician was contacted and recommended giving Mucinex, patient's aide called the niece this afternoon that patient was vomiting, niece activated EMS and on arrival of EMS team, she was noted to be hypoxic with an O2 sat of 84% on room air, NRB at 15 L was provided with improvement in oxygenation to 98%. Patient was tachycardic at rate of  136, she was febrile with a temperature of 102F (orally). Code sepsis was activated en route to the ED.;CXR indicated on 10/19/22 potential Aspiration PNA or B PNA.BSEcompleted this AM and MBSS recommended; MBS completed and recommendation for NPO w/ unsuccessful attempt at non-oral feeding/PEG placement.  ST f/u for PO readiness.      SLP Plan  Continue with current plan of care      Recommendations for follow up therapy are one component of a multi-disciplinary discharge planning process, led by the attending physician.  Recommendations may be updated based on patient status, additional functional criteria and insurance authorization.    Recommendations  Diet recommendations: NPO Medication Administration: Via alternative means                General recommendations:  (TBD) Oral Care Recommendations: Oral care QID;Staff/trained caregiver to provide oral care Follow Up Recommendations: Follow physician's recommendations for discharge plan and follow up therapies Assistance recommended at discharge: Frequent or constant Supervision/Assistance SLP Visit Diagnosis: Dysphagia, oropharyngeal phase (R13.12) Plan: Continue with current plan of care           Jane Andrews, M.S., CCC-SLP  10/24/2022, 1:31 PM

## 2022-10-24 NOTE — Progress Notes (Signed)
Occupational Therapy Treatment Patient Details Name: Jane Andrews MRN: [id number] DOB: [birth year] Today's Date: 10/24/2022   History of present illness Jane Andrews is a 88 y.o. 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  Patient was laying in bed with neck brace and most of the history was obtained from ED physician and niece at the bedside.  Per report, patient presented with cough and congestion a few days ago, her physician was contacted and recommended giving Mucinex, patient's aide called the niece this afternoon that patient was vomiting, niece activated EMS and on arrival of EMS team, she was noted to be hypoxic with an O2 sat of 84% on room air, NRB at 15 L was provided with improvement in oxygenation to 98%.  Patient was tachycardic at rate of 136, she was febrile with a temperature of 102F (orally).  Code sepsis was activated en route to the ED.   OT comments  Pt agreeable to OT and PT co-treatment. Pt presenting more lethargic today then previous session. Pt did increase arousal once sitting at EOB but pt demonstrated poor seated balance with L side lean. Pt also leaning to L side when standing with RW. Moderate assistance to transfer to the chair where pt was able to engage in 5 reps of B UE shoulder flexion but limited to below 50% of available range for most reps. Pt left in chair with call bell within reach and chair alarm set. Pt will benefit from continued OT in the hospital and recommended venue below to increase strength, balance, and endurance for safe ADL's.      Recommendations for follow up therapy are one component of a multi-disciplinary discharge planning process, led by the attending physician.  Recommendations  may be updated based on patient status, additional functional criteria and insurance authorization.    Follow Up Recommendations  Skilled nursing-short term rehab (<3 hours/day)    Assistance Recommended at Discharge Intermittent Supervision/Assistance  Patient can return home with the following  A little help with walking and/or transfers;A lot of help with bathing/dressing/bathroom;Assistance with cooking/housework;Assist for transportation;Help with stairs or ramp for entrance   Equipment Recommendations  None recommended by OT    Recommendations for Other Services      Precautions / Restrictions Precautions Precautions: Fall;Cervical Restrictions Weight Bearing Restrictions: No       Mobility Bed Mobility Overal bed mobility: Needs Assistance Bed Mobility: Supine to Sit     Supine to sit: Mod assist     General bed mobility comments: Very slow labored movement needing assist to pull to sit and scoot towards the edge of the bed.    Transfers Overall transfer level: Needs assistance Equipment used: Rolling walker (2 wheels) Transfers: Sit to/from Stand, Bed to chair/wheelchair/BSC Sit to Stand: Mod assist     Step pivot transfers: Mod assist     General transfer comment: Labored movement and much extended time. Assist to manipulate RW.     Balance Overall balance assessment: Needs assistance Sitting-balance support: Feet supported, Bilateral upper extremity supported Sitting balance-Leahy Scale: Poor Sitting balance - Comments: seated EOB Postural control: Left lateral lean Standing balance support: Bilateral upper extremity supported, During functional activity Standing balance-Leahy Scale: Poor Standing balance comment: using RW  Cognition Arousal/Alertness: Awake/alert Behavior During Therapy: WFL for tasks assessed/performed Overall Cognitive Status: Within Functional Limits for tasks assessed                                           Exercises Exercises: General Upper Extremity General Exercises - Upper Extremity Shoulder Flexion: AROM, 5 reps, Limitations, Seated Shoulder Flexion Limitations: Lmited to around 45 degrees of shoulder flexion today.                  Pertinent Vitals/ Pain       Pain Assessment Pain Assessment: Faces Faces Pain Scale: Hurts even more Pain Location: back Pain Descriptors / Indicators: Grimacing, Guarding Pain Intervention(s): Limited activity within patient's tolerance, Monitored during session, Repositioned                                                           Frequency  Min 1X/week        Progress Toward Goals  OT Goals(current goals can now be found in the care plan section)  Progress towards OT goals: Progressing toward goals  Acute Rehab OT Goals Patient Stated Goal: Go to rehab. OT Goal Formulation: With patient/family Time For Goal Achievement: 11/05/22 Potential to Achieve Goals: Good ADL Goals Pt Will Perform Grooming: with min guard assist;sitting Pt Will Transfer to Toilet: with modified independence;ambulating Pt/caregiver will Perform Home Exercise Program: Increased ROM;Increased strength;Both right and left upper extremity;Independently Additional ADL Goal #1: Niece with verbalize understanding of handout and what information she needs to relay to pt's PCA's.  Plan Discharge plan remains appropriate    Co-evaluation    PT/OT/SLP Co-Evaluation/Treatment: Yes Reason for Co-Treatment: To address functional/ADL transfers   OT goals addressed during session: ADL's and self-care                          End of Session Equipment Utilized During Treatment: Rolling walker (2 wheels);Cervical collar  OT Visit Diagnosis: Unsteadiness on feet (R26.81);Other abnormalities of gait and mobility (R26.89);Muscle weakness (generalized) (M62.81)   Activity Tolerance Patient  tolerated treatment well   Patient Left with family/visitor present;in chair;with chair alarm set   Nurse Communication          Time: 6294-7654 OT Time Calculation (min): 28 min  Charges: OT General Charges $OT Visit: 1 Visit OT Treatments $Self Care/Home Management : 8-22 mins  Elara Cocke OT, MOT  Larey Seat 10/24/2022, 9:18 AM

## 2022-10-24 NOTE — Care Management Important Message (Signed)
Important Message  Patient Details  Name: Jane Andrews MRN: [id number] Date of Birth: 07/16/1925   Medicare Important Message Given:  Yes     Tommy Medal 10/24/2022, 10:28 AM

## 2022-10-24 NOTE — Progress Notes (Signed)
   Subjective:  Patient is more somnolent today and "a little more confused" per niece [name] who is at bedside. No acute events overnight.  Objective:  Vital signs in last 24 hours: Vitals:   10/23/22 1720 10/23/22 2140 10/24/22 0024 10/24/22 0722  BP: (!) 142/62 (!) 153/77 (!) 171/94 (!) 159/111  Pulse: (!) 103 83 97 (!) 122  Resp: '16 16 20 19  '$ Temp:  97.8 F (36.6 C) 98 F (36.7 C)   TempSrc:      SpO2: 98% 99% 98% 99%  Weight:      Height:       Weight change:   Intake/Output Summary (Last 24 hours) at 10/24/2022 1003 Last data filed at 10/23/2022 1836 Gross per 24 hour  Intake 524.27 ml  Output 1150 ml  Net -625.73 ml   Physical Examination    General: lying in bed with neck brace, in no acute distress Head: normocephalic, atraumatic Cardiovascular: regular rate and rhythm, systolic murmur, without rubs, or gallops, no LEE Respiratory: normal respiratory effort, bilateral basilar rhonchi, improved air movement Abdominal: normoactive bowel sounds, no tenderness to palpation Skin: warm, dry Central Nervous System: alert and oriented x 3 but more drowsy today Psychiatric: Normal mood and affect   Assessment/Plan:  Principal Problem:   CAP (community acquired pneumonia) Active Problems:   CKD (chronic kidney disease) stage 4, GFR 15-29 ml/min (HCC)   Acquired hypothyroidism   Atrial fibrillation (HCC)   Normocytic anemia   Acute respiratory failure with hypoxia (HCC)   Overactive bladder   Hypoalbuminemia due to protein-calorie malnutrition (HCC)   Anxiety   Depression   Cervical stenosis of spinal canal   ARFH / Aspiration pneumonia Afebrile. Patient does have an elevated WBC (11.4) but patient is receiving IV Decadron. Patient without acute signs of infection. - IV Unasyn discontinued yesterday - supportive measures   Dysphagia  - postop complication of ACDF - discussed with neurosurgeon Dr. Trenton Gammon - he recommended IV decadron 4 mg IV Q6h - feels  patient should improve in 3-5 days, then can rechallenge orally - no need to send to [facility] for PEG per Dr. Trenton Gammon at this time - continue NPO status for now -SLP following and will plan on repeat MBSS (Monday) if stable   Stage IV CKD - stable, following    Persistent Atrial Fibrillation  - unable to take oral metoprolol - continue IV lopressor 10 mg Q6h (see below) - per cardiologist, not a candidate for anticoagulation   Hypothyroidism  - IV levothyroxine starting Monday (or oral if patient is no longer NPO). TSH normal on 11/7.   Overactive Bladder - unable to resume home myrbetriq   Cervical stenosis s/p ACDF - discussed with neurosurgeon Dr. Trenton Gammon - starting IV decadron every 6 hours - continue neck brace  - IV pain management    UTI - asymptomatic, DC IV fluconazole    Hypertension Patient with SBP consistently in upper 160's. Likely secondary to pain and withdrawal from home regimen of Xanax. -IV Hydralazine ordered and Metoprolol increased from 7.5 to 10 mg q6 -IV Ativan ordered -Continue to monitor  Hyperglycemia Secondary to IV Decadron. CBG is 222. Patient transitioned from D5 LR to LR infusion with q4 CBG monitoring.   LOS: 5 days   Stanford Breed, Medical Student 10/24/2022, 10:03 AM

## 2022-10-24 NOTE — Progress Notes (Addendum)
Physical Therapy Treatment Patient Details Name: Jane Andrews MRN: [id number] DOB: 07-26-1925 Today's Date: 10/24/2022   History of Present Illness Jane Andrews is a 88 y.o. 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  Patient was laying in bed with neck brace and most of the history was obtained from ED physician and niece at the bedside.  Per report, patient presented with cough and congestion a few days ago, her physician was contacted and recommended giving Mucinex, patient's aide called the niece this afternoon that patient was vomiting, niece activated EMS and on arrival of EMS team, she was noted to be hypoxic with an O2 sat of 84% on room air, NRB at 15 L was provided with improvement in oxygenation to 98%.  Patient was tachycardic at rate of 136, she was febrile with a temperature of 102F (orally).  Code sepsis was activated en route to the ED.    PT Comments    Patient lethargic today but is arousable to participate. She requires assist to transition to seated EOB and for seated balance. She demonstrates fair sitting tolerance but has frequent c/o thoracic spine pain. She requires assist to transfer to standing with RW and cueing for sequencing. She is able to take a few steps at bedside to chair with increased time and assist. Patient ends session seated in chair with family present. Patient will benefit from continued skilled physical therapy in hospital and recommended venue below to increase strength, balance, endurance for safe ADLs and gait.   Recommendations for follow up therapy are one component of a multi-disciplinary discharge planning process, led by the attending physician.  Recommendations may be updated based on  patient status, additional functional criteria and insurance authorization.  Follow Up Recommendations  Skilled nursing-short term rehab (<3 hours/day) Can patient physically be transported by private vehicle: Yes   Assistance Recommended at Discharge Frequent or constant Supervision/Assistance  Patient can return home with the following A lot of help with walking and/or transfers;Assist for transportation;Direct supervision/assist for medications management;A lot of help with bathing/dressing/bathroom   Equipment Recommendations  None recommended by PT    Recommendations for Other Services       Precautions / Restrictions Precautions Precautions: Fall;Cervical Required Braces or Orthoses: Cervical Brace Cervical Brace: Soft collar;At all times Restrictions Weight Bearing Restrictions: No     Mobility  Bed Mobility Overal bed mobility: Needs Assistance Bed Mobility: Supine to Sit     Supine to sit: Mod assist     General bed mobility comments: Very slow labored movement needing assist to pull to sit and scoot towards the edge of the bed.    Transfers Overall transfer level: Needs assistance Equipment used: Rolling walker (2 wheels) Transfers: Sit to/from Stand, Bed to chair/wheelchair/BSC Sit to Stand: Mod assist   Step pivot transfers: Mod assist       General transfer comment: Labored movement and much extended time. Assist to hold on RW    Ambulation/Gait Ambulation/Gait assistance: Mod assist Gait Distance (Feet): 2 Feet Assistive device: Rolling walker (2 wheels) Gait Pattern/deviations: Shuffle Gait velocity: decreased     General Gait Details: labored, small steps at bedside to chair, frequent cueing   Stairs             Wheelchair Mobility    Modified  Rankin (Stroke Patients Only)       Balance Overall balance assessment: Needs assistance Sitting-balance support: Feet supported, Bilateral upper extremity supported Sitting  balance-Leahy Scale: Poor Sitting balance - Comments: seated EOB Postural control: Left lateral lean Standing balance support: Bilateral upper extremity supported, During functional activity Standing balance-Leahy Scale: Poor Standing balance comment: using RW                            Cognition Arousal/Alertness: Awake/alert Behavior During Therapy: WFL for tasks assessed/performed Overall Cognitive Status: Within Functional Limits for tasks assessed                                          Exercises      General Comments        Pertinent Vitals/Pain Pain Assessment Pain Assessment: Faces Pain Score: 4  Faces Pain Scale: Hurts even more Pain Location: back Pain Descriptors / Indicators: Grimacing, Guarding Pain Intervention(s): Limited activity within patient's tolerance, Monitored during session, Repositioned    Home Living                          Prior Function            PT Goals (current goals can now be found in the care plan section) Acute Rehab PT Goals Patient Stated Goal: return home PT Goal Formulation: With patient/family Time For Goal Achievement: 11/04/22 Potential to Achieve Goals: Good Progress towards PT goals: Progressing toward goals    Frequency    Min 3X/week      PT Plan      Co-evaluation PT/OT/SLP Co-Evaluation/Treatment: Yes Reason for Co-Treatment: To address functional/ADL transfers PT goals addressed during session: Mobility/safety with mobility;Balance;Proper use of DME;Strengthening/ROM OT goals addressed during session: ADL's and self-care      AM-PAC PT "6 Clicks" Mobility   Outcome Measure  Help needed turning from your back to your side while in a flat bed without using bedrails?: A Lot Help needed moving from lying on your back to sitting on the side of a flat bed without using bedrails?: A Lot Help needed moving to and from a bed to a chair (including a wheelchair)?: A  Lot Help needed standing up from a chair using your arms (e.g., wheelchair or bedside chair)?: A Lot Help needed to walk in hospital room?: A Lot Help needed climbing 3-5 steps with a railing? : A Lot 6 Click Score: 12    End of Session Equipment Utilized During Treatment: Cervical collar Activity Tolerance: Patient tolerated treatment well Patient left: in chair;with call bell/phone within reach;with family/visitor present Nurse Communication: Mobility status PT Visit Diagnosis: Other abnormalities of gait and mobility (R26.89);Muscle weakness (generalized) (M62.81);Other symptoms and signs involving the nervous system (J50.093)     Time: 8182-9937 PT Time Calculation (min) (ACUTE ONLY): 27 min  Charges:  $Therapeutic Activity: 8-22 mins                     9:43 AM, 10/24/22 Mearl Latin PT, DPT Physical Therapist at [facility]

## 2022-10-24 NOTE — Progress Notes (Signed)
Patient had complain of back pain, Pain medications given and repositioned patient in beck with little decrease in pain. Report given to oncoming nurse.

## 2022-10-24 NOTE — TOC Progression Note (Signed)
Transition of Care [facility]) - Progression Note    Patient Details  Name: Jane Andrews MRN: [id number] Date of Birth: 1925/06/17  Transition of Care [facility]) CM/SW [place], [place] Phone Number: 10/24/2022, 10:15 AM  Clinical Narrative:    Admissions at [facility] is following pt at this time. They have not made a bed offer at this time but will follow up on Monday. TOC to follow.     Barriers to Discharge: Continued Medical Work up  Expected Discharge Plan and Services   In-house Referral: Clinical Social Work     Living arrangements for the past 2 months: Single Family Home                                       Social Determinants of Health (SDOH) Interventions    Readmission Risk Interventions    10/21/2022    9:21 AM 06/15/2020   10:21 AM  Readmission Risk Prevention Plan  Medication Screening  Complete  Transportation Screening Complete Complete  Medication Review (RN Care Manager) Complete   HRI or [place] Complete   SW Recovery Care/Counseling Consult Complete   Palliative Care Screening Not [place] Not Applicable

## 2022-10-25 DIAGNOSIS — D649 Anemia, unspecified: Secondary | ICD-10-CM | POA: Diagnosis not present

## 2022-10-25 DIAGNOSIS — J189 Pneumonia, unspecified organism: Secondary | ICD-10-CM | POA: Diagnosis not present

## 2022-10-25 DIAGNOSIS — J9601 Acute respiratory failure with hypoxia: Secondary | ICD-10-CM | POA: Diagnosis not present

## 2022-10-25 DIAGNOSIS — M4802 Spinal stenosis, cervical region: Secondary | ICD-10-CM | POA: Diagnosis not present

## 2022-10-25 LAB — COMPREHENSIVE METABOLIC PANEL
ALT: 18 U/L (ref 0–44)
AST: 25 U/L (ref 15–41)
Albumin: 3.1 g/dL — ABNORMAL LOW (ref 3.5–5.0)
Alkaline Phosphatase: 75 U/L (ref 38–126)
Anion gap: 8 (ref 5–15)
BUN: 35 mg/dL — ABNORMAL HIGH (ref 8–23)
CO2: 24 mmol/L (ref 22–32)
Calcium: 9 mg/dL (ref 8.9–10.3)
Chloride: 114 mmol/L — ABNORMAL HIGH (ref 98–111)
Creatinine, Ser: 1.25 mg/dL — ABNORMAL HIGH (ref 0.44–1.00)
GFR, Estimated: 40 mL/min — ABNORMAL LOW (ref >60)
Glucose, Bld: 144 mg/dL — ABNORMAL HIGH (ref 70–99)
Potassium: 3.2 mmol/L — ABNORMAL LOW (ref 3.5–5.1)
Sodium: 146 mmol/L — ABNORMAL HIGH (ref 135–145)
Total Bilirubin: 0.8 mg/dL (ref 0.3–1.2)
Total Protein: 6.3 g/dL — ABNORMAL LOW (ref 6.5–8.1)

## 2022-10-25 LAB — GLUCOSE, CAPILLARY
Glucose-Capillary: 124 mg/dL — ABNORMAL HIGH (ref 70–99)
Glucose-Capillary: 148 mg/dL — ABNORMAL HIGH (ref 70–99)
Glucose-Capillary: 151 mg/dL — ABNORMAL HIGH (ref 70–99)
Glucose-Capillary: 162 mg/dL — ABNORMAL HIGH (ref 70–99)
Glucose-Capillary: 169 mg/dL — ABNORMAL HIGH (ref 70–99)
Glucose-Capillary: 173 mg/dL — ABNORMAL HIGH (ref 70–99)
Glucose-Capillary: 189 mg/dL — ABNORMAL HIGH (ref 70–99)

## 2022-10-25 LAB — CBC
HCT: 33.6 % — ABNORMAL LOW (ref 36.0–46.0)
Hemoglobin: 10.9 g/dL — ABNORMAL LOW (ref 12.0–15.0)
MCH: 27.7 pg (ref 26.0–34.0)
MCHC: 32.4 g/dL (ref 30.0–36.0)
MCV: 85.3 fL (ref 80.0–100.0)
Platelets: 211 10*3/uL (ref 150–400)
RBC: 3.94 MIL/uL (ref 3.87–5.11)
RDW: 16.1 % — ABNORMAL HIGH (ref 11.5–15.5)
WBC: 12.7 10*3/uL — ABNORMAL HIGH (ref 4.0–10.5)
nRBC: 0.3 % — ABNORMAL HIGH (ref 0.0–0.2)

## 2022-10-25 LAB — MAGNESIUM: Magnesium: 2 mg/dL (ref 1.7–2.4)

## 2022-10-25 MED ORDER — HYDRALAZINE HCL 20 MG/ML IJ SOLN
10.0000 mg | Freq: Four times a day (QID) | INTRAMUSCULAR | Status: DC
  Administered 2022-10-25 – 2022-10-26 (×4): 10 mg via INTRAVENOUS
  Filled 2022-10-25 (×4): qty 1

## 2022-10-25 MED ORDER — HYDRALAZINE HCL 20 MG/ML IJ SOLN
8.0000 mg | Freq: Four times a day (QID) | INTRAMUSCULAR | Status: DC
  Administered 2022-10-25: 8 mg via INTRAVENOUS
  Filled 2022-10-25: qty 1

## 2022-10-25 MED ORDER — POTASSIUM CHLORIDE 10 MEQ/100ML IV SOLN
10.0000 meq | INTRAVENOUS | Status: AC
  Administered 2022-10-25 (×4): 10 meq via INTRAVENOUS
  Filled 2022-10-25 (×4): qty 100

## 2022-10-25 MED ORDER — DEXTROSE 5 % IV SOLN: INTRAVENOUS | Status: AC

## 2022-10-25 MED ORDER — INFLUENZA VAC A&B SA ADJ QUAD 0.5 ML IM PRSY
0.5000 mL | PREFILLED_SYRINGE | INTRAMUSCULAR | Status: AC
  Administered 2022-10-26: 0.5 mL via INTRAMUSCULAR
  Filled 2022-10-25: qty 0.5

## 2022-10-25 MED ORDER — METOPROLOL TARTRATE 5 MG/5ML IV SOLN
12.5000 mg | Freq: Four times a day (QID) | INTRAVENOUS | Status: AC
  Administered 2022-10-25 – 2022-10-30 (×16): 12.5 mg via INTRAVENOUS
  Filled 2022-10-25 (×19): qty 15

## 2022-10-25 NOTE — Progress Notes (Signed)
   10/25/22 1300  Vitals  Temp (!) 97.5 F (36.4 C)  BP (!) 164/78  MAP (mmHg) 98  Pulse Rate 76  Resp 15  Level of Consciousness  Level of Consciousness Alert  MEWS COLOR  MEWS Score Color Green  Oxygen Therapy  SpO2 98 %  O2 Device Nasal Cannula  O2 Flow Rate (L/min) 2 L/min  MEWS Score  MEWS Temp 0  MEWS Systolic 0  MEWS Pulse 0  MEWS RR 0  MEWS LOC 0  MEWS Score 0   MD Johnson notified.

## 2022-10-25 NOTE — Progress Notes (Signed)
PROGRESS NOTE   Jane Andrews  PPJ:[id number] DOB: [birth year] DOA: 10/19/2022 PCP: Redmond School, MD   Chief Complaint  Patient presents with   Shortness of Breath   Level of care: Telemetry  Brief Admission History:  88 y.o. 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  Patient was laying in bed with neck brace and most of the history was obtained from ED physician and niece at the bedside.  Per report, patient presented with cough and congestion a few days ago, her physician was contacted and recommended giving Mucinex, patient's aide called the niece this afternoon that patient was vomiting, niece activated EMS and on arrival of EMS team, she was noted to be hypoxic with an O2 sat of 84% on room air, NRB at 15 L was provided with improvement in oxygenation to 98%.  Patient was tachycardic at rate of 136, she was febrile with a temperature of 102F (orally).  Code sepsis was activated en route to the ED.   ED Course:  In the emergency department, temperature was 98.6 F, respiration was 25/min, HR 138 bpm, BP was 110/65, O2 sat was 94% on supplemental oxygen at 2 LPM.  Work-up in the ED showed normocytic anemia with WBC of 11.4, BMP showed sodium 136, potassium 4.3, chloride 107, bicarb 20, glucose 116, BUN 30, creatinine 1.88 (baseline creatinine at 1.5-1.7).  Albumin 3.0, AST 42, ALT 14, ALP 117.  Lactic acid was 1.8 > 1.4.  Blood culture pending, influenza A, B, SARS coronavirus 2 was negative.  Chest x-ray showed new  patchy airspace disease in both lung bases consistent with bilateral pneumonia or aspiration. Minimal pleural effusions. He was treated with IV ceftriaxone and azithromycin, IV hydration was provided.  Hospitalist was asked to  admit patient for further evaluation and management.     Assessment and Plan:  Maramec / Aspiration pneumonia Afebrile. Patient does have an elevated WBC (11.4) but patient is receiving IV Decadron. Patient without acute signs of infection. - IV Unasyn course completed - supportive measures   Dysphagia  - severe - postop complication of ACDF - discussed with neurosurgeon Dr. Trenton Gammon - he recommended IV decadron 4 mg IV Q6h - feels patient should improve in 3-5 days, then can rechallenge orally - no need to send to [facility] for PEG per Dr. Trenton Gammon at this time - continue NPO status for now -SLP following and will plan on repeat MBSS (Monday) 11/13   Stage IV CKD - stable, following    Persistent Atrial Fibrillation  - unable to take oral metoprolol - continue IV lopressor 12.5 mg Q6h (see below) - per cardiologist, not a candidate for anticoagulation   Hypothyroidism  - IV levothyroxine starting Monday (or oral if patient is no longer NPO). TSH normal on 11/7.   Overactive Bladder - unable to resume home myrbetriq   Cervical stenosis s/p ACDF - discussed with neurosurgeon Dr. Trenton Gammon - starting IV decadron every 6 hours - continue neck brace  - IV pain management    UTI - asymptomatic, DC IV fluconazole    Hypertension - uncontrolled - persistently elevated BP likely elevated due to IV steroids.   - Patient with SBP consistently in upper 160's. Likely secondary to pain and withdrawal from home regimen of Xanax. -IV Hydralazine ordered and Metoprolol increased  from 10 - 12.5 mg q6, increased IV hydralazine in attempt to improve blood pressures -IV Ativan ordered -Continue to monitor   Hyperglycemia - Steroid induced  Secondary to IV Decadron. Continue SSI coverage and CBG monitoring   DVT prophylaxis: SCDs Code Status: DNR  Family Communication: bedside updates daily Disposition: Status is: Inpatient Remains inpatient appropriate because: IV decadron, IV fluid   Consultants:   Telephone call Dr. Trenton Gammon her neurosurgeon Procedures:   Antimicrobials:    Subjective: Pt feels pretty bad, she is reporting that pain and anxiety is better controlled.   Objective: Vitals:   10/25/22 0520 10/25/22 1139 10/25/22 1300 10/25/22 1409  BP: (!) 160/83 (!) 169/86 (!) 164/78 (!) 166/89  Pulse: 79  76 90  Resp: '19  15 19  '$ Temp: 97.6 F (36.4 C)  (!) 97.5 F (36.4 C) 97.9 F (36.6 C)  TempSrc: Oral     SpO2: 100%  98% 98%  Weight:      Height:        Intake/Output Summary (Last 24 hours) at 10/25/2022 1430 Last data filed at 10/24/2022 1807 Gross per 24 hour  Intake 338.33 ml  Output --  Net 338.33 ml   Filed Weights   10/19/22 2058 10/19/22 2137  Weight: 50.3 kg 50.8 kg   Examination:  General exam: Appears calm and comfortable red flushing of face in cheeks Neck: in soft brace.  Respiratory system: Clear to auscultation. Respiratory effort normal. Cardiovascular system: normal S1 & S2 heard. No JVD, murmurs, rubs, gallops or clicks. No pedal edema. Gastrointestinal system: Abdomen is nondistended, soft and nontender. No organomegaly or masses felt. Normal bowel sounds heard. Central nervous system: Alert and oriented. No focal neurological deficits. Extremities: Symmetric 5 x 5 power. Skin: No rashes, lesions or ulcers. Psychiatry: Judgement and insight appear normal. Mood & affect flat.    Data Reviewed: I have personally reviewed following labs and imaging studies  CBC: Recent Labs  Lab 10/19/22 2130 10/21/22 0552 10/21/22 1327 10/22/22 0508 10/23/22 0426 10/24/22 0351 10/25/22 0508  WBC 11.4* 10.6*  --  8.9 5.8 11.4* 12.7*  NEUTROABS 10.6*  --   --   --   --   --   --   HGB 8.0* 7.0* 6.7* 9.7* 10.3* 11.1* 10.9*  HCT 25.4* 23.0* 21.8* 30.5* 32.4* 34.2* 33.6*  MCV 87.9 90.6  --  85.7 86.2 84.7 85.3  PLT 199 172  --  161 159 268 790    Basic Metabolic Panel: Recent Labs  Lab 10/20/22 0926 10/21/22 0338 10/22/22 0508 10/23/22 0426  10/24/22 0351 10/25/22 0508  NA  --  140 142 145 147* 146*  K  --  3.8 3.6 3.5 3.3* 3.2*  CL  --  112* 112* 114* 115* 114*  CO2  --  19* 22 18* 19* 24  GLUCOSE  --  93 98 116* 222* 144*  BUN  --  24* 19 25* 31* 35*  CREATININE  --  1.65* 1.50* 1.54* 1.47* 1.25*  CALCIUM  --  8.0* 8.3* 8.6* 9.1 9.0  MG 1.5* 2.5* 2.1 2.1 2.0 2.0  PHOS 3.1  --   --   --   --   --     CBG: Recent Labs  Lab 10/24/22 2003 10/25/22 0009 10/25/22 0355 10/25/22 0734 10/25/22 1120  GLUCAP 137* 151* 124* 148* 162*    Recent Results (from the past 240 hour(s))  Urine Culture     Status: Abnormal   Collection Time: 10/19/22  9:10 PM   Specimen: In/Out Cath Urine  Result Value Ref Range Status   Specimen Description   Final    IN/OUT CATH URINE Performed at [facility], [address], [place], [place] [address]    Special Requests   Final    NONE Performed at [facility], [address], [place], [place] [address]    Culture 6,000 COLONIES/mL YEAST (A)  Final   Report Status 10/21/2022 FINAL  Final  Culture, blood (Routine x 2)     Status: None   Collection Time: 10/19/22  9:30 PM   Specimen: BLOOD LEFT FOREARM  Result Value Ref Range Status   Specimen Description BLOOD LEFT FOREARM  Final   Special Requests   Final    BOTTLES DRAWN AEROBIC AND ANAEROBIC Blood Culture adequate volume   Culture   Final    NO GROWTH 5 DAYS Performed at [facility], [address], [place], [place] [address]    Report Status 10/24/2022 FINAL  Final  Culture, blood (Routine x 2)     Status: None   Collection Time: 10/19/22  9:54 PM   Specimen: BLOOD LEFT HAND  Result Value Ref Range Status   Specimen Description BLOOD LEFT HAND  Final   Special Requests   Final    BOTTLES DRAWN AEROBIC ONLY Blood Culture adequate volume   Culture   Final    NO GROWTH 5 DAYS Performed at [facility], [address] [place], [place] [address]    Report Status 10/24/2022 FINAL  Final  Resp Panel by RT-PCR (Flu A&B,  Covid) Anterior Nasal Swab     Status: None   Collection Time: 10/19/22 10:14 PM   Specimen: Anterior Nasal Swab  Result Value Ref Range Status   SARS Coronavirus 2 by RT PCR NEGATIVE NEGATIVE Final    Comment: (NOTE) SARS-CoV-2 target nucleic acids are NOT DETECTED.  The SARS-CoV-2 RNA is generally detectable in upper respiratory specimens during the acute phase of infection. The lowest concentration of SARS-CoV-2 viral copies this assay can detect is 138 copies/mL. A negative result does not preclude SARS-Cov-2 infection and should not be used as the sole basis for treatment or other patient management decisions. A negative result may occur with  improper specimen collection/handling, submission of specimen other than nasopharyngeal swab, presence of viral mutation(s) within the areas targeted by this assay, and inadequate number of viral copies(<138 copies/mL). A negative result must be combined with clinical observations, patient history, and epidemiological information. The expected result is Negative.  Fact Sheet for Patients:  EntrepreneurPulse.com.au  Fact Sheet for Healthcare Providers:  IncredibleEmployment.be  This test is no t yet approved or cleared by the [place] FDA and  has been authorized for detection and/or diagnosis of SARS-CoV-2 by FDA under an Emergency Use Authorization (EUA). This EUA will remain  in effect (meaning this test can be used) for the duration of the COVID-19 declaration under Section 564(b)(1) of the Act, 21 U.S.C.section 360bbb-3(b)(1), unless the authorization is terminated  or revoked sooner.       Influenza A by PCR NEGATIVE NEGATIVE Final   Influenza B by PCR NEGATIVE NEGATIVE Final    Comment: (NOTE) The Xpert Xpress SARS-CoV-2/FLU/RSV plus assay is intended as an aid in the diagnosis of influenza from Nasopharyngeal swab specimens and should not be used as a sole basis for treatment. Nasal  washings and aspirates are unacceptable for Xpert Xpress SARS-CoV-2/FLU/RSV testing.  Fact Sheet for Patients: EntrepreneurPulse.com.au  Fact Sheet for  Healthcare Providers: IncredibleEmployment.be  This test is not yet approved or cleared by the [place] and has been authorized for detection and/or diagnosis of SARS-CoV-2 by FDA under an Emergency Use Authorization (EUA). This EUA will remain in effect (meaning this test can be used) for the duration of the COVID-19 declaration under Section 564(b)(1) of the Act, 21 U.S.C. section 360bbb-3(b)(1), unless the authorization is terminated or revoked.  Performed at [facility], [address], [place], [place] [address]      Radiology Studies: No results found.  Scheduled Meds:  Chlorhexidine Gluconate Cloth  6 each Topical Daily   dexamethasone (DECADRON) injection  4 mg Intravenous Q6H   DULoxetine  60 mg Oral q morning   feeding supplement  237 mL Oral BID BM   gabapentin  100 mg Oral BID   gabapentin  200 mg Oral QHS   hydrALAZINE  10 mg Intravenous Q6H   [START ON 10/26/2022] influenza vaccine adjuvanted  0.5 mL Intramuscular Tomorrow-1000   insulin aspart  0-9 Units Subcutaneous Q4H   levothyroxine  75 mcg Oral QAC breakfast   metoprolol tartrate  12.5 mg Intravenous Q6H   mirabegron ER  50 mg Oral Daily   sodium chloride flush  10-40 mL Intracatheter Q12H   Continuous Infusions:  sodium chloride Stopped (10/25/22 0532)   dextrose 75 mL/hr at 10/25/22 0827   potassium chloride       LOS: 6 days   Time spent: 35 mins  Cyler Kappes Wynetta Emery, MD How to contact the [facility] Attending or Consulting provider [place] or covering provider during after hours [place], for this patient?  Check the care team in [facility] and look for a) attending/consulting TRH provider listed and b) the [facility] team listed Log into [email or web address] and use [place]'s universal password to access. If you do not have  the password, please contact the hospital operator. Locate the [facility] provider you are looking for under [facility] and page to a number that you can be directly reached. If you still have difficulty reaching the provider, please page the [facility] (Director on Call) for the Hospitalists listed on amion for assistance.  10/25/2022, 2:30 PM

## 2022-10-25 NOTE — Progress Notes (Signed)
Patient's face is noticeably red. Checked patient's temperature and it was 97.5. MD Wynetta Emery notified.

## 2022-10-25 NOTE — Progress Notes (Signed)
MD Wynetta Emery made aware of the swelling in patient's hands.

## 2022-10-26 DIAGNOSIS — D649 Anemia, unspecified: Secondary | ICD-10-CM | POA: Diagnosis not present

## 2022-10-26 DIAGNOSIS — J189 Pneumonia, unspecified organism: Secondary | ICD-10-CM | POA: Diagnosis not present

## 2022-10-26 DIAGNOSIS — J9601 Acute respiratory failure with hypoxia: Secondary | ICD-10-CM | POA: Diagnosis not present

## 2022-10-26 DIAGNOSIS — M4802 Spinal stenosis, cervical region: Secondary | ICD-10-CM | POA: Diagnosis not present

## 2022-10-26 LAB — BASIC METABOLIC PANEL
Anion gap: 6 (ref 5–15)
BUN: 39 mg/dL — ABNORMAL HIGH (ref 8–23)
CO2: 23 mmol/L (ref 22–32)
Calcium: 8.7 mg/dL — ABNORMAL LOW (ref 8.9–10.3)
Chloride: 110 mmol/L (ref 98–111)
Creatinine, Ser: 1.23 mg/dL — ABNORMAL HIGH (ref 0.44–1.00)
GFR, Estimated: 40 mL/min — ABNORMAL LOW (ref >60)
Glucose, Bld: 107 mg/dL — ABNORMAL HIGH (ref 70–99)
Potassium: 3.8 mmol/L (ref 3.5–5.1)
Sodium: 139 mmol/L (ref 135–145)

## 2022-10-26 LAB — GLUCOSE, CAPILLARY
Glucose-Capillary: 104 mg/dL — ABNORMAL HIGH (ref 70–99)
Glucose-Capillary: 121 mg/dL — ABNORMAL HIGH (ref 70–99)
Glucose-Capillary: 128 mg/dL — ABNORMAL HIGH (ref 70–99)
Glucose-Capillary: 133 mg/dL — ABNORMAL HIGH (ref 70–99)
Glucose-Capillary: 145 mg/dL — ABNORMAL HIGH (ref 70–99)
Glucose-Capillary: 152 mg/dL — ABNORMAL HIGH (ref 70–99)
Glucose-Capillary: 175 mg/dL — ABNORMAL HIGH (ref 70–99)

## 2022-10-26 MED ORDER — HYDRALAZINE HCL 20 MG/ML IJ SOLN
15.0000 mg | Freq: Four times a day (QID) | INTRAMUSCULAR | Status: DC
  Administered 2022-10-26 – 2022-10-30 (×15): 15 mg via INTRAVENOUS
  Filled 2022-10-26 (×15): qty 1

## 2022-10-26 MED ORDER — LEVOTHYROXINE SODIUM 100 MCG/5ML IV SOLN
37.5000 ug | Freq: Once | INTRAVENOUS | Status: AC
  Administered 2022-10-26: 37.5 ug via INTRAVENOUS
  Filled 2022-10-26: qty 5

## 2022-10-26 MED ORDER — DEXAMETHASONE SODIUM PHOSPHATE 4 MG/ML IJ SOLN
4.0000 mg | Freq: Two times a day (BID) | INTRAMUSCULAR | Status: DC
  Administered 2022-10-26: 4 mg via INTRAVENOUS
  Filled 2022-10-26: qty 1

## 2022-10-26 MED ORDER — ALTEPLASE 2 MG IJ SOLR
2.0000 mg | Freq: Once | INTRAMUSCULAR | Status: DC
  Filled 2022-10-26: qty 2

## 2022-10-26 NOTE — Progress Notes (Addendum)
PROGRESS NOTE   Jane Andrews  IPJ:[id number] DOB: Sep 16, 1925 DOA: 10/19/2022 PCP: Redmond School, MD   Chief Complaint  Patient presents with   Shortness of Breath   Level of care: Telemetry  Brief Admission History:  88 y.o. 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  Patient was laying in bed with neck brace and most of the history was obtained from ED physician and niece at the bedside.  Per report, patient presented with cough and congestion a few days ago, her physician was contacted and recommended giving Mucinex, patient's aide called the niece this afternoon that patient was vomiting, niece activated EMS and on arrival of EMS team, she was noted to be hypoxic with an O2 sat of 84% on room air, NRB at 15 L was provided with improvement in oxygenation to 98%.  Patient was tachycardic at rate of 136, she was febrile with a temperature of 102F (orally).  Code sepsis was activated en route to the ED.   ED Course:  In the emergency department, temperature was 98.6 F, respiration was 25/min, HR 138 bpm, BP was 110/65, O2 sat was 94% on supplemental oxygen at 2 LPM.  Work-up in the ED showed normocytic anemia with WBC of 11.4, BMP showed sodium 136, potassium 4.3, chloride 107, bicarb 20, glucose 116, BUN 30, creatinine 1.88 (baseline creatinine at 1.5-1.7).  Albumin 3.0, AST 42, ALT 14, ALP 117.  Lactic acid was 1.8 > 1.4.  Blood culture pending, influenza A, B, SARS coronavirus 2 was negative.  Chest x-ray showed new  patchy airspace disease in both lung bases consistent with bilateral pneumonia or aspiration. Minimal pleural effusions. He was treated with IV ceftriaxone and azithromycin, IV hydration was provided.  Hospitalist was asked to  admit patient for further evaluation and management.    Assessment and Plan:  [place] / Aspiration pneumonia Afebrile. Patient does have an elevated WBC (11.4) but patient is receiving IV Decadron. Patient without acute signs of infection. - IV Unasyn course completed - supportive measures   Dysphagia  - severe - postop complication of ACDF - discussed with neurosurgeon Dr. Trenton Gammon - he recommended IV decadron 4 mg IV Q6h - feels patient should improve in 3-5 days, then can rechallenge orally - no need to send to [facility] for PEG per Dr. Trenton Gammon at this time - continue NPO status for now -SLP following and will plan on repeat MBSS (Monday) 11/13   Stage IV CKD - stable, following    Persistent Atrial Fibrillation  - unable to take oral metoprolol - continue IV lopressor 12.5 mg Q6h (see below) - per cardiologist, not a candidate for anticoagulation   Hypothyroidism  - I have asked the pharmacy if she can have a dose of IV levothyroxine as she has been without oral dose for 1 week now - can give dose today levothyroxine 37.5 mcg x 1 dose 11/12   Overactive Bladder - unable to resume home myrbetriq   Cervical stenosis s/p ACDF - discussed with neurosurgeon Dr. Trenton Gammon - started IV decadron 4 mg every 6 hours on 11/8, reducing dose to Q12 hours on 11/12 - continue neck brace  - IV pain management    UTI - asymptomatic, DC IV fluconazole    Hypertension - uncontrolled - persistently elevated BP likely elevated  due to IV steroids.   - Patient with SBP consistently in upper 160's. Likely secondary to pain and withdrawal from home regimen of Xanax. -IV Hydralazine ordered and Metoprolol increased from 10 - 12.5 mg q6, increased IV hydralazine in attempt to improve blood pressures -IV Ativan ordered -Continue to monitor   Hyperglycemia - Steroid induced  Secondary to IV Decadron. Continue SSI coverage and CBG monitoring CBG (last 3)  Recent Labs    10/26/22 0408 10/26/22 0735  10/26/22 1111  GLUCAP 104* 152* 133*   Depression / Adjustment Disorder - Pt has been unable to take her antidepressives for over a week now due to severe dysphagia - hopefully she will pass swallow test tomorrow and diet can be advanced and she can resume her antidepressants   DVT prophylaxis: SCDs Code Status: DNR  Family Communication: bedside updates daily; we had a discussion today about plan if pt fails swallow study Disposition: Status is: Inpatient Remains inpatient appropriate because: IV decadron, IV fluid   Consultants:  Telephone call Dr. Trenton Gammon her neurosurgeon 10/22/22 Procedures:   Antimicrobials:    Subjective: Pt still feeling pretty miserable, says she "hurts all over"      Objective: Vitals:   10/25/22 1640 10/25/22 2003 10/26/22 0034 10/26/22 0449  BP: (!) 153/76 (!) 149/79 (!) 159/82 (!) 151/84  Pulse:  76 96 81  Resp:  '20 17 16  '$ Temp:  97.7 F (36.5 C)  97.6 F (36.4 C)  TempSrc:    Oral  SpO2:  100% 97% 99%  Weight:      Height:        Intake/Output Summary (Last 24 hours) at 10/26/2022 1145 Last data filed at 10/26/2022 0900 Gross per 24 hour  Intake 1693.24 ml  Output 950 ml  Net 743.24 ml   Filed Weights   10/19/22 2058 10/19/22 2137  Weight: 50.3 kg 50.8 kg   Examination:  General exam: Appears calm and comfortable red flushing of face in cheeks. Awake but appears depressed.  Neck: in soft brace.  Respiratory system: Clear to auscultation. Respiratory effort normal. Cardiovascular system: normal S1 & S2 heard. No JVD, murmurs, rubs, gallops or clicks. No pedal edema. Gastrointestinal system: Abdomen is nondistended, soft and nontender. No organomegaly or masses felt. Normal bowel sounds heard. Central nervous system: Alert and oriented. No focal neurological deficits. Extremities: Symmetric 5 x 5 power. Skin: No rashes, lesions or ulcers. Psychiatry: Judgement and insight appear normal. Mood & affect flat/depressed.    Data  Reviewed: I have personally reviewed following labs and imaging studies  CBC: Recent Labs  Lab 10/19/22 2130 10/21/22 0552 10/21/22 1327 10/22/22 0508 10/23/22 0426 10/24/22 0351 10/25/22 0508  WBC 11.4* 10.6*  --  8.9 5.8 11.4* 12.7*  NEUTROABS 10.6*  --   --   --   --   --   --   HGB 8.0* 7.0* 6.7* 9.7* 10.3* 11.1* 10.9*  HCT 25.4* 23.0* 21.8* 30.5* 32.4* 34.2* 33.6*  MCV 87.9 90.6  --  85.7 86.2 84.7 85.3  PLT 199 172  --  161 159 268 326    Basic Metabolic Panel: Recent Labs  Lab 10/20/22 0926 10/21/22 0338 10/22/22 0508 10/23/22 0426 10/24/22 0351 10/25/22 0508 10/26/22 0510  NA  --  140 142 145 147* 146* 139  K  --  3.8 3.6 3.5 3.3* 3.2* 3.8  CL  --  112* 112* 114* 115* 114* 110  CO2  --  19* 22 18* 19* 24 23  GLUCOSE  --  93 98 116* 222* 144* 107*  BUN  --  24* 19 25* 31* 35* 39*  CREATININE  --  1.65* 1.50* 1.54* 1.47* 1.25* 1.23*  CALCIUM  --  8.0* 8.3* 8.6* 9.1 9.0 8.7*  MG 1.5* 2.5* 2.1 2.1 2.0 2.0  --   PHOS 3.1  --   --   --   --   --   --     CBG: Recent Labs  Lab 10/25/22 1946 10/26/22 0011 10/26/22 0408 10/26/22 0735 10/26/22 1111  GLUCAP 173* 175* 104* 152* 133*    Recent Results (from the past 240 hour(s))  Urine Culture     Status: Abnormal   Collection Time: 10/19/22  9:10 PM   Specimen: In/Out Cath Urine  Result Value Ref Range Status   Specimen Description   Final    IN/OUT CATH URINE Performed at [facility] [address], [place], [place] [address]    Special Requests   Final    NONE Performed at [facility], [address] [place], [place] [address]    Culture 6,000 COLONIES/mL YEAST (A)  Final   Report Status 10/21/2022 FINAL  Final  Culture, blood (Routine x 2)     Status: None   Collection Time: 10/19/22  9:30 PM   Specimen: BLOOD LEFT FOREARM  Result Value Ref Range Status   Specimen Description BLOOD LEFT FOREARM  Final   Special Requests   Final    BOTTLES DRAWN AEROBIC AND ANAEROBIC Blood Culture adequate  volume   Culture   Final    NO GROWTH 5 DAYS Performed at [facility], [address], [place], [place] [address]    Report Status 10/24/2022 FINAL  Final  Culture, blood (Routine x 2)     Status: None   Collection Time: 10/19/22  9:54 PM   Specimen: BLOOD LEFT HAND  Result Value Ref Range Status   Specimen Description BLOOD LEFT HAND  Final   Special Requests   Final    BOTTLES DRAWN AEROBIC ONLY Blood Culture adequate volume   Culture   Final    NO GROWTH 5 DAYS Performed at [facility], [address] [place], [place] [address]    Report Status 10/24/2022 FINAL  Final  Resp Panel by RT-PCR (Flu A&B, Covid) Anterior Nasal Swab     Status: None   Collection Time: 10/19/22 10:14 PM   Specimen: Anterior Nasal Swab  Result Value Ref Range Status   SARS Coronavirus 2 by RT PCR NEGATIVE NEGATIVE Final    Comment: (NOTE) SARS-CoV-2 target nucleic acids are NOT DETECTED.  The SARS-CoV-2 RNA is generally detectable in upper respiratory specimens during the acute phase of infection. The lowest concentration of SARS-CoV-2 viral copies this assay can detect is 138 copies/mL. A negative result does not preclude SARS-Cov-2 infection and should not be used as the sole basis for treatment or other patient management decisions. A negative result may occur with  improper specimen collection/handling, submission of specimen other than nasopharyngeal swab, presence of viral mutation(s) within the areas targeted by this assay, and inadequate number of viral copies(<138 copies/mL). A negative result must be combined with clinical observations, patient history, and epidemiological information. The expected result is Negative.  Fact Sheet for Patients:  EntrepreneurPulse.com.au  Fact Sheet for Healthcare Providers:  IncredibleEmployment.be  This test is no t yet approved or cleared by the [place] FDA and  has been authorized for detection and/or  diagnosis of SARS-CoV-2  by FDA under an Emergency Use Authorization (EUA). This EUA will remain  in effect (meaning this test can be used) for the duration of the COVID-19 declaration under Section 564(b)(1) of the Act, 21 U.S.C.section 360bbb-3(b)(1), unless the authorization is terminated  or revoked sooner.       Influenza A by PCR NEGATIVE NEGATIVE Final   Influenza B by PCR NEGATIVE NEGATIVE Final    Comment: (NOTE) The Xpert Xpress SARS-CoV-2/FLU/RSV plus assay is intended as an aid in the diagnosis of influenza from Nasopharyngeal swab specimens and should not be used as a sole basis for treatment. Nasal washings and aspirates are unacceptable for Xpert Xpress SARS-CoV-2/FLU/RSV testing.  Fact Sheet for Patients: EntrepreneurPulse.com.au  Fact Sheet for Healthcare Providers: IncredibleEmployment.be  This test is not yet approved or cleared by the [place] FDA and has been authorized for detection and/or diagnosis of SARS-CoV-2 by FDA under an Emergency Use Authorization (EUA). This EUA will remain in effect (meaning this test can be used) for the duration of the COVID-19 declaration under Section 564(b)(1) of the Act, 21 U.S.C. section 360bbb-3(b)(1), unless the authorization is terminated or revoked.  Performed at [facility], [address], [place], [place] [address]     Radiology Studies: No results found.  Scheduled Meds:  Chlorhexidine Gluconate Cloth  6 each Topical Daily   dexamethasone (DECADRON) injection  4 mg Intravenous Q12H   DULoxetine  60 mg Oral q morning   feeding supplement  237 mL Oral BID BM   gabapentin  100 mg Oral BID   gabapentin  200 mg Oral QHS   hydrALAZINE  10 mg Intravenous Q6H   insulin aspart  0-9 Units Subcutaneous Q4H   levothyroxine  75 mcg Oral QAC breakfast   metoprolol tartrate  12.5 mg Intravenous Q6H   mirabegron ER  50 mg Oral Daily   sodium chloride flush  10-40 mL Intracatheter  Q12H   Continuous Infusions:  sodium chloride Stopped (10/25/22 0532)   dextrose 75 mL/hr at 10/26/22 0600    LOS: 7 days   Time spent: 35 mins  Patsie Mccardle Wynetta Emery, MD How to contact the [facility] Attending or Consulting provider Onward or covering provider during after hours [place], for this patient?  Check the care team in [facility] and look for a) attending/consulting [facility] provider listed and b) the [facility] team listed Log into [email or web address] and use [place]'s universal password to access. If you do not have the password, please contact the hospital operator. Locate the [facility] provider you are looking for under [facility] and page to a number that you can be directly reached. If you still have difficulty reaching the provider, please page the [facility] (Director on Call) for the Hospitalists listed on amion for assistance.  10/26/2022, 11:45 AM

## 2022-10-26 NOTE — Progress Notes (Signed)
Pt was able to tolerate oral gabapentin.  Pt sat in upright position and allowed sips of water to moisten mouth prior to med administration.  Pt given one pill at a time. Able to flush and get blood return from pt Lumen 1 in PICC.  No need for cathflo at this time.  Will continue to monitor.

## 2022-10-26 NOTE — Plan of Care (Signed)
Problem: Education: Goal: Ability to verbalize activity precautions or restrictions will improve Outcome: Progressing Goal: Knowledge of the prescribed therapeutic regimen will improve Outcome: Progressing Goal: Understanding of discharge needs will improve Outcome: Progressing   Problem: Activity: Goal: Ability to avoid complications of mobility impairment will improve Outcome: Progressing Goal: Ability to tolerate increased activity will improve Outcome: Progressing Goal: Will remain free from falls Outcome: Progressing   Problem: Bowel/Gastric: Goal: Gastrointestinal status for postoperative course will improve Outcome: Progressing   Problem: Clinical Measurements: Goal: Ability to maintain clinical measurements within normal limits will improve Outcome: Progressing Goal: Postoperative complications will be avoided or minimized Outcome: Progressing Goal: Diagnostic test results will improve Outcome: Progressing   Problem: Pain Management: Goal: Pain level will decrease Outcome: Progressing   Problem: Skin Integrity: Goal: Will show signs of wound healing Outcome: Progressing   Problem: Health Behavior/Discharge Planning: Goal: Identification of resources available to assist in meeting health care needs will improve Outcome: Progressing   Problem: Bladder/Genitourinary: Goal: Urinary functional status for postoperative course will improve Outcome: Progressing   Problem: Activity: Goal: Ability to tolerate increased activity will improve Outcome: Progressing   Problem: Clinical Measurements: Goal: Ability to maintain a body temperature in the normal range will improve Outcome: Progressing   Problem: Respiratory: Goal: Ability to maintain adequate ventilation will improve Outcome: Progressing Goal: Ability to maintain a clear airway will improve Outcome: Progressing   Problem: Education: Goal: Knowledge of General Education information will  improve Description: Including pain rating scale, medication(s)/side effects and non-pharmacologic comfort measures Outcome: Progressing   Problem: Health Behavior/Discharge Planning: Goal: Ability to manage health-related needs will improve Outcome: Progressing   Problem: Clinical Measurements: Goal: Ability to maintain clinical measurements within normal limits will improve Outcome: Progressing Goal: Will remain free from infection Outcome: Progressing Goal: Diagnostic test results will improve Outcome: Progressing Goal: Respiratory complications will improve Outcome: Progressing Goal: Cardiovascular complication will be avoided Outcome: Progressing   Problem: Activity: Goal: Risk for activity intolerance will decrease Outcome: Progressing   Problem: Nutrition: Goal: Adequate nutrition will be maintained Outcome: Progressing   Problem: Coping: Goal: Level of anxiety will decrease Outcome: Progressing   Problem: Elimination: Goal: Will not experience complications related to bowel motility Outcome: Progressing Goal: Will not experience complications related to urinary retention Outcome: Progressing   Problem: Pain Managment: Goal: General experience of comfort will improve Outcome: Progressing   Problem: Safety: Goal: Ability to remain free from injury will improve Outcome: Progressing   Problem: Skin Integrity: Goal: Risk for impaired skin integrity will decrease Outcome: Progressing   Problem: Education: Goal: Ability to describe self-care measures that may prevent or decrease complications (Diabetes Survival Skills Education) will improve Outcome: Progressing Goal: Individualized Educational Video(s) Outcome: Progressing   Problem: Coping: Goal: Ability to adjust to condition or change in health will improve Outcome: Progressing   Problem: Fluid Volume: Goal: Ability to maintain a balanced intake and output will improve Outcome: Progressing    Problem: Health Behavior/Discharge Planning: Goal: Ability to identify and utilize available resources and services will improve Outcome: Progressing Goal: Ability to manage health-related needs will improve Outcome: Progressing   Problem: Metabolic: Goal: Ability to maintain appropriate glucose levels will improve Outcome: Progressing   Problem: Nutritional: Goal: Maintenance of adequate nutrition will improve Outcome: Progressing Goal: Progress toward achieving an optimal weight will improve Outcome: Progressing   Problem: Skin Integrity: Goal: Risk for impaired skin integrity will decrease Outcome: Progressing   Problem: Tissue Perfusion: Goal: Adequacy of tissue perfusion will  improve Outcome: Progressing

## 2022-10-26 NOTE — Progress Notes (Signed)
   10/26/22 1721  Vitals  BP (!) 170/72  Pulse Rate 63   Metoprolol was held due to hr of 63. The order states to not give if hr is <65. MD Wynetta Emery made aware. Scheduled hydralazine was still given.

## 2022-10-27 ENCOUNTER — Inpatient Hospital Stay (HOSPITAL_COMMUNITY): Payer: Medicare Other

## 2022-10-27 ENCOUNTER — Encounter (HOSPITAL_COMMUNITY): Payer: Self-pay | Admitting: Internal Medicine

## 2022-10-27 DIAGNOSIS — J189 Pneumonia, unspecified organism: Secondary | ICD-10-CM | POA: Diagnosis not present

## 2022-10-27 DIAGNOSIS — E43 Unspecified severe protein-calorie malnutrition: Secondary | ICD-10-CM | POA: Insufficient documentation

## 2022-10-27 DIAGNOSIS — D649 Anemia, unspecified: Secondary | ICD-10-CM | POA: Diagnosis not present

## 2022-10-27 DIAGNOSIS — M4802 Spinal stenosis, cervical region: Secondary | ICD-10-CM | POA: Diagnosis not present

## 2022-10-27 DIAGNOSIS — J9601 Acute respiratory failure with hypoxia: Secondary | ICD-10-CM | POA: Diagnosis not present

## 2022-10-27 LAB — GLUCOSE, CAPILLARY
Glucose-Capillary: 123 mg/dL — ABNORMAL HIGH (ref 70–99)
Glucose-Capillary: 80 mg/dL (ref 70–99)
Glucose-Capillary: 114 mg/dL — ABNORMAL HIGH (ref 70–99)
Glucose-Capillary: 126 mg/dL — ABNORMAL HIGH (ref 70–99)
Glucose-Capillary: 126 mg/dL — ABNORMAL HIGH (ref 70–99)

## 2022-10-27 MED ORDER — DEXAMETHASONE SODIUM PHOSPHATE 4 MG/ML IJ SOLN
2.0000 mg | Freq: Two times a day (BID) | INTRAMUSCULAR | Status: DC
  Administered 2022-10-27 – 2022-10-30 (×6): 2 mg via INTRAVENOUS
  Filled 2022-10-27 (×6): qty 1

## 2022-10-27 MED ORDER — METOPROLOL TARTRATE 5 MG/5ML IV SOLN: 5.0000 mg | Freq: Once | INTRAVENOUS | Status: DC

## 2022-10-27 MED ORDER — INSULIN ASPART 100 UNIT/ML IJ SOLN
0.0000 [IU] | Freq: Four times a day (QID) | INTRAMUSCULAR | Status: DC
  Administered 2022-10-27 – 2022-10-28 (×3): 1 [IU] via SUBCUTANEOUS
  Administered 2022-10-28: 2 [IU] via SUBCUTANEOUS
  Administered 2022-10-28 – 2022-10-29 (×3): 1 [IU] via SUBCUTANEOUS
  Administered 2022-10-29 – 2022-10-30 (×3): 2 [IU] via SUBCUTANEOUS
  Administered 2022-10-30: 1 [IU] via SUBCUTANEOUS
  Administered 2022-10-30: 2 [IU] via SUBCUTANEOUS
  Administered 2022-10-31: 1 [IU] via SUBCUTANEOUS

## 2022-10-27 NOTE — Progress Notes (Signed)
MBSS- Modified Barium Swallow Study    10/27/22 1700  SLP Visit Information  SLP Received On 10/27/22  Pain Assessment  Pain Assessment No/denies pain  General Information  HPI Jane Andrews is a 88 y.o. 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 Patient was lying in bed with neck brace and most of the history was obtained from ED physician and niece at the bedside. Per report, patient presented with cough and congestion a few days ago, her physician was contacted and recommended giving Mucinex, patient's aide called the niece this afternoon that patient was vomiting, niece activated EMS and on arrival of EMS team, she was noted to be hypoxic with an O2 sat of 84% on room air, NRB at 15 L was provided with improvement in oxygenation to 98%. Patient was tachycardic at rate of 136, she was febrile with a temperature of 102F (orally). Code sepsis was activated en route to the ED.;CXR indicated on 10/19/22 potential Aspiration PNA or B PNA.BSEcompleted this AM and MBSS recommended; MBS completed and recommendation for NPO w/ unsuccessful attempt at non-oral feeding/PEG placement.  ST f/u for PO readiness.  Caregiver present No  Diet Prior to this Study NPO  Temperature  Normal  Respiratory Status WFL  Supplemental O2 Nasal cannula  History of Recent Intubation Yes  Length of Intubations (days) 1 days  Date extubated 10/17/22  Number of Intubations 1  Self extubation  No  Behavior/Cognition Alert;Cooperative  Self-Feeding Abilities Able to self-feed;Needs set-up for self-feeding  Baseline vocal quality/speech Normal  Volitional Cough Able to elicit  Volitional Cough Assessment Appears WFL  Volitional Swallow Able to elicit  Anatomy  Presence of cervical hardware  Orofacial Exam  Oral Cavity Assessment WFL  Oral Cavity - Dentition Adequate natural dentition  Orofacial Anatomy WFL  Oral Motor/Sensory Function WFL  Thin Liquids (Level 0)  Thin Liquids  Impaired  Bolus delivery method Spoon  Thin Liquid - Impairment Oral Impairment;Pharyngeal impairment  Lip Closure Not impaired  Tongue control during bolus hold Posterior escape of greater than half of bolus  Bolus transport/lingual motion Brisk tongue motion  Oral residue Trace residue lining oral structures  Location of oral residue  Tongue  Initiation of swallow  Valleculae  Soft palate elevation Complete  Tongue base retraction Wide column of contrast or air between tongue base and PPW  Laryngeal elevation Complete superior movement of thyroid cartilage with complete approximation of arytenoids to epiglottic petiole  Anterior hyoid excursion Partial  Epiglottic movement Partial  Laryngeal vestibule closure Incomplete  Pharyngeal stripping wave  Present - diminished  Pharyngeal contraction (A/P view only) N/A  Pharyngoesophageal segment opening Complete distension and complete duration, no obstruction of flow  Pharyngeal residue Collection of residue within or on pharyngeal structures  Location of pharyngeal residue Valleculae;Aryepiglottic folds  Penetration/Aspiration Scale (PAS) score 7.  Material enters airway, passes BELOW cords and not ejected out despite cough attempt by patient  Mildly thick liquids (Level 2, nectar thick)  Bolus delivery method Cup  Mildly Thick Liquid - Impairment Pharyngeal impairment;Oral Impairment  Lip Closure Not impaired  Tongue control during bolus hold Posterior escape of greater than half of bolus  Bolus transport/lingual motion Delayed initiation of tongue motion (oral holding)  Oral residue Trace residue lining oral structures  Location of oral  residue  Tongue  Initiation of swallow  Valleculae  Soft palate elevation  Complete  Tongue base retraction Narrow column of contrast or air between tongue base and PPW;Wide column of contrast or air between tongue base and PPW  Laryngeal elevation Complete superior movement of thyroid cartilage with complete approximation of arytenoids to epiglottic petiole  Anterior hyoid excursion Complete  Epiglottic movement Partial  Laryngeal vestibule closure Incomplete  Pharyngeal stripping wave  Present - diminished  Pharyngeal contraction (A/P view only) N/A  Pharyngoesophageal segment opening Complete distension and complete duration, no obstruction of flow  Pharyngeal residue Collection of residue within or on pharyngeal structures  Location of pharyngeal residue Valleculae;Pyriform sinuses  Penetration/Aspiration Scale (PAS) score 3.  Material enters airway, remains ABOVE vocal cords and not ejected out  Moderately thick liquids (Level 3, honey thick)  Moderately thick liquids (Level 3, honey thick) Impaired  Moderately Thick Liquid - Impairment Pharyngeal impairment  Initiation of swallow  Valleculae  Soft palate elevation Complete  Tongue base retraction Narrow column of contrast or air between tongue base and PPW  Laryngeal elevation Complete superior movement of thyroid cartilage with complete approximation of arytenoids to epiglottic petiole  Anterior hyoid excursion Complete  Epiglottic movement Partial  Laryngeal vestibule closure Incomplete  Pharyngeal stripping wave  Present - diminished  Pharyngeal contraction (A/P view only) N/A  Pharyngoesophageal segment opening Complete distension and complete duration, no obstruction of flow  Pharyngeal residue Collection of residue within or on pharyngeal structures  Location of pharyngeal residue Valleculae;Pharyngeal wall;Pyriform sinuses  Penetration/Aspiration Scale (PAS) score 7.  Material enters airway, passes BELOW cords and not ejected out despite cough attempt by patient  Puree  Puree Impaired  Puree -  Impairment Pharyngeal impairment  Initiation of swallow Valleculae  Soft palate elevation Complete  Tongue base retraction Narrow column of contrast or air between tongue base and PPW  Laryngeal elevation Complete superior movement of thyroid cartilage with complete approximation of arytenoids to epiglottic petiole  Anterior hyoid excursion Complete  Epiglottic movement Partial  Laryngeal vestibule closure Incomplete  Pharyngeal stripping wave  Present - diminished  Pharyngeal contraction (A/P view only) Incomplete  Pharyngoesophageal segment opening Complete distension and complete duration, no obstruction of flow  Pharyngeal residue Collection of residue within or on pharyngeal structures  Location of pharyngeal residue Valleculae;Pharyngeal wall;Pyriform sinuses  Penetration/Aspiration Scale (PAS) score 4.  Material enters airway, CONTACTS cords then ejected out  Solid  Solid Not Tested  Pill  Pill Not Tested  Compensatory Strategies  Compensatory strategies No  Effortful swallow Ineffective  Right head turn Effective;Ineffective  Effective Right Head Turn  (inconsistent mild improvement with head turn R)  Clinical Impression  Clinical Impression Pt continues to present with moderate pharyngeal dysphagia with seemingly slight improvement in posterior pharyngeal wall edema, however Pt continues to be at high risk for aspiration. Pt with mild premature spillage of liquids, delay in swallow trigger at the level of the valleculae. Pt with reduced tongue base retraction and epiglottic deflection with poor pharyngeal constriction. Pt able to swallow/clear ~50% of each bolus during initial swallow, however the remaining 50% is left in the pharynx and at times penetrated and aspirated. Pt turned AP and pharyngeal transit was noted to be greater on the left side. Head turn to the right was only mildly effective in reducing pharyngeal residue. Suspect a transition to an oral diet is going to be  longer than anticipated and Pt now deconditioned. Recommend strong consideration of alternative means of  nutrition- consider PEG so that Pt can go to SNF for therapy. Prognosis for oral diet is good with time and therapy. Recommend NPO except for ice chips after oral care when Pt is alert and upright.     SLP Visit Diagnosis Dysphagia, pharyngeal phase (R13.13);Dysphagia, oropharyngeal phase (R13.12)  Factors that may increase risk of adverse event in presence of aspiration ([place] 2021) Frail or deconditioned;Aspiration of thick, dense, and/or acidic materials;Frequent aspiration of large volumes  Swallowing Evaluation Recommendations  Recommendations Ice chips PRN after oral care;NPO;Alternative means of nutrition - NG Tube;Alternative means of nutrition - G Tube  Medication Administration Via alternative means  Oral care recommendations Oral care before ice chips/water;Oral care QID (4x/day);Staff/trained caregiver to provide oral care  Recommended consults Consider dietitian consultation (for alternative means of nutrition)  Treatment Plan  Treatment recommendations Therapy as outlined in treatment plan below  Follow-up recommendations Skilled nursing-short term rehab (<3 hours/day)  Assistance recommended at discharge Frequent or constant supervision/assistance  Functional status assessment Patient has had a recent decline in their functional status and demonstrates the ability to make significant improvements in function in a reasonable and predictable amount of time.  Treatment frequency Min 2x/week  Treatment duration 1 week  Interventions Aspiration precaution training;Diet toleration management by SLP;Patient/family education;Compensatory techniques  Goal Planning  Prognosis for Safe Diet Advancement Fair  Barriers to Reach Goals Severity of deficits  Patient/Family Stated Goal Eat/drink safely  Consulted and agree with results and recommendations Patient;Family  member/caregiver;Nurse;Physician  SLP Time Calculation  SLP Start Time (ACUTE ONLY) 1245  SLP Stop Time (ACUTE ONLY) 1314  SLP Time Calculation (min) (ACUTE ONLY) 29 min  SLP Evaluations  $ SLP Speech Visit 1 Visit  SLP Evaluations  $MBS Swallow 1 Procedure   Thank you,  Genene Churn, CCC-SLP [phone]

## 2022-10-27 NOTE — Progress Notes (Signed)
[facility]  Discussed case with Dr. Wynetta Emery.  I explained that if this patient's edema is severe enough that radiology was unable to pass NG tube, it is very unlikely that I will be able to pass an endoscope for PEG tube placement.  I recommend PICC line placement for TPN administration and consultation to IR to see if they are able to place G-tube for the patient.  If patient's edema improves, could reconsider PEG tube placement.  Please call with any questions or concerns.  Graciella Freer, DO [facility] [address] [place], [place] [address] [phone] (office)

## 2022-10-27 NOTE — Progress Notes (Signed)
Physical Therapy Treatment Patient Details Name: Jane Andrews MRN: [id number] DOB: 03-30-1925 Today's Date: 10/27/2022   History of Present Illness Jane Andrews is a 88 y.o. 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  Patient was laying in bed with neck brace and most of the history was obtained from ED physician and niece at the bedside.  Per report, patient presented with cough and congestion a few days ago, her physician was contacted and recommended giving Mucinex, patient's aide called the niece this afternoon that patient was vomiting, niece activated EMS and on arrival of EMS team, she was noted to be hypoxic with an O2 sat of 84% on room air, NRB at 15 L was provided with improvement in oxygenation to 98%.  Patient was tachycardic at rate of 136, she was febrile with a temperature of 102F (orally).  Code sepsis was activated en route to the ED.    PT Comments    Patient demonstrates slow labored movement for rolling side to side in bed while being cleaned and difficulty sitting up from side lying position to sitting due to generalized weakness.  Patient able to take a few side steps at bedside, but limited mostly due to fatigue, poor standing balance and BLE weakness.  Patient tolerated sitting up in chair after therapy with her family member present in room - nursing staff aware.  Patient will benefit from continued skilled physical therapy in hospital and recommended venue below to increase strength, balance, endurance for safe ADLs and gait.    Recommendations for follow up therapy are one component of a multi-disciplinary discharge planning process, led by the attending physician.  Recommendations may be updated based on patient  status, additional functional criteria and insurance authorization.  Follow Up Recommendations  Skilled nursing-short term rehab (<3 hours/day) Can patient physically be transported by private vehicle: Yes   Assistance Recommended at Discharge Intermittent Supervision/Assistance  Patient can return home with the following A lot of help with walking and/or transfers;Assist for transportation;Direct supervision/assist for medications management;A lot of help with bathing/dressing/bathroom   Equipment Recommendations  None recommended by PT    Recommendations for Other Services       Precautions / Restrictions Precautions Precautions: Fall Required Braces or Orthoses: Cervical Brace Cervical Brace: Soft collar;At all times Restrictions Weight Bearing Restrictions: No     Mobility  Bed Mobility   Bed Mobility: Rolling, Sidelying to Sit Rolling: Min guard, Min assist Sidelying to sit: Mod assist, Max assist       General bed mobility comments: slow labored movement with diffiuclty pushing self  to sitting from sidelying secondary to weakness    Transfers Overall transfer level: Needs assistance Equipment used: Rolling walker (2 wheels) Transfers: Sit to/from Stand, Bed to chair/wheelchair/BSC Sit to Stand: Mod assist   Step pivot transfers: Mod assist       General transfer comment: slow labored unsteady movement    Ambulation/Gait Ambulation/Gait assistance: Mod assist Gait Distance (Feet): 5 Feet Assistive device: Rolling walker (2 wheels) Gait Pattern/deviations: Decreased step length - right, Decreased step length - left, Decreased stride length, Staggering right, Staggering left Gait velocity: slow     General Gait Details: limited to a few slow labored unsteady side steps with frequent staggering left/right, near loss of balance, limited mostly due  to c/o fatigue   Stairs             Wheelchair Mobility    Modified Rankin (Stroke Patients  Only)       Balance Overall balance assessment: Needs assistance Sitting-balance support: Feet supported, No upper extremity supported Sitting balance-Leahy Scale: Fair Sitting balance - Comments: seated at EOB   Standing balance support: Reliant on assistive device for balance, During functional activity, Bilateral upper extremity supported Standing balance-Leahy Scale: Poor Standing balance comment: fair/poor using RW                            Cognition Arousal/Alertness: Awake/alert Behavior During Therapy: WFL for tasks assessed/performed Overall Cognitive Status: Within Functional Limits for tasks assessed                                          Exercises General Exercises - Lower Extremity Long Arc Quad: Seated, AROM, Strengthening, Both, 10 reps Hip Flexion/Marching: Seated, AROM, Strengthening, Both, 10 reps Toe Raises: Seated, AROM, Strengthening, Both, 10 reps Heel Raises: Seated, AROM, Strengthening, Both, 10 reps    General Comments        Pertinent Vitals/Pain Pain Assessment Pain Assessment: 0-10 Pain Score: 6  Pain Location: back, spine Pain Descriptors / Indicators: Discomfort, Sore Pain Intervention(s): Limited activity within patient's tolerance, Monitored during session, Repositioned    Home Living                          Prior Function            PT Goals (current goals can now be found in the care plan section) Acute Rehab PT Goals Patient Stated Goal: return home PT Goal Formulation: With patient/family Time For Goal Achievement: 11/04/22 Potential to Achieve Goals: Good Progress towards PT goals: Progressing toward goals    Frequency    Min 3X/week      PT Plan Current plan remains appropriate    Co-evaluation              AM-PAC PT "6 Clicks" Mobility   Outcome Measure  Help needed turning from your back to your side while in a flat bed without using bedrails?: A Lot Help  needed moving from lying on your back to sitting on the side of a flat bed without using bedrails?: A Lot Help needed moving to and from a bed to a chair (including a wheelchair)?: A Lot Help needed standing up from a chair using your arms (e.g., wheelchair or bedside chair)?: A Lot Help needed to walk in hospital room?: A Lot Help needed climbing 3-5 steps with a railing? : Total 6 Click Score: 11    End of Session Equipment Utilized During Treatment: Cervical collar Activity Tolerance: Patient tolerated treatment well;Patient limited by fatigue Patient left: in chair;with call bell/phone within reach;with chair alarm set;with family/visitor present Nurse Communication: Mobility status PT Visit Diagnosis: Other abnormalities of gait and mobility (R26.89);Muscle weakness (generalized) (M62.81);Other symptoms and signs involving the nervous system (R29.898)     Time: 1017-5102 PT Time Calculation (min) (ACUTE ONLY): 36 min  Charges:  $Therapeutic Exercise: 8-22 mins $Therapeutic Activity: 8-22 mins                     3:56 PM, 10/27/22 Lonell Grandchild, MPT  Physical Therapist with [facility] [phone] office [phone] mobile phone

## 2022-10-27 NOTE — Progress Notes (Signed)
Received call from central tele approx 0300 that pt was in AFIB rhythm.  Pt midnight metoprolol had been held per orders as her HR was outside parameters for administration.  Pt assessed, pt having pain.  Given hydromorphone to attempt to manage pain and control elevated HR.  Pt later reports management of pain, however remains in AFIB rhythm with elevated HR.  Provider  notified, am metoprolol administered at this time, Pt converted back to NSR with HR 60s-70s

## 2022-10-27 NOTE — Progress Notes (Signed)
PHARMACY - TOTAL PARENTERAL NUTRITION CONSULT NOTE   Indication:  intolerance to enteral feed  Patient Measurements: Height: '5\' 1"'$  (154.9 cm) Weight: 50.8 kg (112 lb) IBW/kg (Calculated) : 47.8 TPN AdjBW (KG): 50.8 Body mass index is 21.16 kg/m. Usual Weight:   Assessment:   Glucose / Insulin: WNL- on sliding scale Electrolytes: WNL- F/U labs in AM Renal: Scr 1.23 Hepatic: WNL Intake / Output; MIVF: D5W @ 50 mL/hr GI Imaging: GI Surgeries / Procedures:   Central access: PICC 11/6 TPN start date: 11/14  Nutritional Goals: Goal TPN rate is 70 mL/hr (provides 50 g of protein and 1528 kcals per day)  RD Assessment: Estimated Needs Total Energy Estimated Needs: 1524-1778 Total Protein Estimated Needs: 77-86 gr Total Fluid Estimated Needs: > 1.5 liters daily  Current Nutrition:  Oral supplements  Plan:  Start TPN 11/14  Jane Andrews 10/27/2022,4:00 PM

## 2022-10-27 NOTE — Progress Notes (Signed)
Nutrition Follow up  DOCUMENTATION CODES:   Severe malnutrition in context of acute illness/injury  INTERVENTION:  Recommendations pending results of MBSS   NUTRITION DIAGNOSIS:  Severe Malnutrition related to inability to eat as evidenced by NPO status (7 days.), moderate muscle loss.   GOAL:   (based on patient healthcare goals)  MONITOR:  Diet advancement, Labs, MBSS results, Healthcare decisions  ASSESSMENT: Patient is a 88 yo female presents with CAP. PMH: GERD (hiatal hernia), short gut syndrome (2004), anemia, HTN.  11/3 s/p- Anterior Cervical Discectomy and Fusion (ACDF) C3-4, C4-5  11/7 SLP bedside swallow evaluation- recommending NPO.   11/7-Patient had (16 fr) -OGT with external length (66 cm) and x-ray to confirm placement pending. Enteral feeding initiation in process.   11/13 Patient lying in bed with eyes closed. Family (POA) is bedside waiting results of MBSS. There has been no NGT and family reports no nutrition since admission.  LOS day 7. NGT was attempted.  Family member hoping for diet to be able to advance. No PEG recommended given her age.   IVF- D5 @ 50 ml/hr (provides-~ 200 kcal/d)   Intake/Output Summary (Last 24 hours) at 10/27/2022 1441 Last data filed at 10/27/2022 0705 Gross per 24 hour  Intake 1484.41 ml  Output 1650 ml  Net -165.59 ml    Medications: lopressor, ferric gluconate     Latest Ref Rng & Units 10/26/2022    5:10 AM 10/25/2022    5:08 AM 10/24/2022    3:51 AM  BMP  Glucose 70 - 99 mg/dL 107  144  222   BUN 8 - 23 mg/dL 39  35  31   Creatinine 0.44 - 1.00 mg/dL 1.23  1.25  1.47   Sodium 135 - 145 mmol/L 139  146  147   Potassium 3.5 - 5.1 mmol/L 3.8  3.2  3.3   Chloride 98 - 111 mmol/L 110  114  115   CO2 22 - 32 mmol/L '23  24  19   '$ Calcium 8.9 - 10.3 mg/dL 8.7  9.0  9.1      Diet Order:   Diet Order             Diet NPO time specified Except for: Sips with Meds  Diet effective now                    EDUCATION NEEDS:  Education needs have been addressed  Skin:  Skin Assessment: Reviewed RN Assessment Incision on neck from 11/3   Last BM:  11/12 type 6 small  Height:   Ht Readings from Last 1 Encounters:  10/19/22 '5\' 1"'$  (1.549 m)    Weight:   Wt Readings from Last 1 Encounters:  10/19/22 50.8 kg    Ideal Body Weight:   48 kg  BMI:  Body mass index is 21.16 kg/m.  Estimated Nutritional Needs:   Kcal:  6073-7106  Protein:  87-95 gr  Fluid:  > 1.5 liters daily  Colman Cater MS,RD,CSG,LDN Contact: Shea Evans

## 2022-10-27 NOTE — Progress Notes (Signed)
PROGRESS NOTE   Jane Andrews  ZOX:[id number] DOB: 07-Aug-1925 DOA: 10/19/2022 PCP: Redmond School, MD   Chief Complaint  Patient presents with   Shortness of Breath   Level of care: Telemetry  Brief Admission History:  88 y.o. 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  Patient was laying in bed with neck brace and most of the history was obtained from ED physician and niece at the bedside.  Per report, patient presented with cough and congestion a few days ago, her physician was contacted and recommended giving Mucinex, patient's aide called the niece this afternoon that patient was vomiting, niece activated EMS and on arrival of EMS team, she was noted to be hypoxic with an O2 sat of 84% on room air, NRB at 15 L was provided with improvement in oxygenation to 98%.  Patient was tachycardic at rate of 136, she was febrile with a temperature of 102F (orally).  Code sepsis was activated en route to the ED.   ED Course:  In the emergency department, temperature was 98.6 F, respiration was 25/min, HR 138 bpm, BP was 110/65, O2 sat was 94% on supplemental oxygen at 2 LPM.  Work-up in the ED showed normocytic anemia with WBC of 11.4, BMP showed sodium 136, potassium 4.3, chloride 107, bicarb 20, glucose 116, BUN 30, creatinine 1.88 (baseline creatinine at 1.5-1.7).  Albumin 3.0, AST 42, ALT 14, ALP 117.  Lactic acid was 1.8 > 1.4.  Blood culture pending, influenza A, B, SARS coronavirus 2 was negative.  Chest x-ray showed new  patchy airspace disease in both lung bases consistent with bilateral pneumonia or aspiration. Minimal pleural effusions. He was treated with IV ceftriaxone and azithromycin, IV hydration was provided.  Hospitalist was asked to  admit patient for further evaluation and management.    Assessment and Plan:  [place] / Aspiration pneumonia Afebrile. Patient does have an elevated WBC (11.4) but patient is receiving IV Decadron. Patient without acute signs of infection. - IV Unasyn course completed - supportive measures   Dysphagia  - severe - postop complication of ACDF - discussed with neurosurgeon Dr. Trenton Gammon - he recommended IV decadron 4 mg IV Q6h - feels patient should improve in 3-5 days, then can rechallenge orally - no need to send to [facility] for PEG per Dr. Trenton Gammon at this time - continue NPO status for now, RN reported she was able to take sips with some pills overnight which is encouraging -SLP following and will plan on repeat MBSS (Monday) 11/13   Stage IV CKD - stable, following    Persistent Atrial Fibrillation  - unable to take oral metoprolol - continue IV lopressor 12.5 mg Q6h (see below) - per cardiologist, not a candidate for anticoagulation   Hypothyroidism  - I have asked the pharmacy if she can have a dose of IV levothyroxine as she has been without oral dose for 1 week now - can give dose today levothyroxine 37.5 mcg x 1 dose 11/12   Overactive Bladder - unable to resume home myrbetriq until she can take p.o. again, if passes swallow eval, would be able to restart today   Cervical stenosis s/p ACDF - discussed with neurosurgeon Dr. Trenton Gammon - started IV decadron 4 mg every 6 hours on 11/8, reducing dose to Q12 hours on  11/12, hopefully can stop it 11/13 if passes swallow eval  - continue neck brace and plan for outpatient follow up with Dr. Trenton Gammon for wound check - IV pain management until able to take p.o.    UTI - asymptomatic, DC IV fluconazole    Hypertension - uncontrolled - persistently elevated BP likely elevated due to IV steroids.   - Patient with SBP consistently in upper 160's. Likely secondary to pain and withdrawal from home regimen of Xanax. -IV Hydralazine ordered and Metoprolol  increased from 10 - 12.5 mg q6, increased IV hydralazine in attempt to improve blood pressures -IV Ativan ordered -Continue to monitor   Hyperglycemia - Steroid induced  Secondary to IV Decadron. Continue SSI coverage and CBG monitoring BS coming down as decadron dose coming down  CBG (last 3)  Recent Labs    10/26/22 2331 10/27/22 0356 10/27/22 0743  GLUCAP 128* 123* 80   Depression / Adjustment Disorder - Pt has been unable to take her antidepressives for over a week now due to severe dysphagia - hopefully she will pass swallow test today and diet can be advanced and she can resume her antidepressants - we did give her an IV dose of levothyroxine on 11/13     DVT prophylaxis: SCDs Code Status: DNR  Family Communication: bedside updates daily; we had a discussion today about plan if pt fails swallow study Disposition: Status is: Inpatient Remains inpatient appropriate because: IV decadron, IV fluid   Consultants:  Telephone call Dr. Trenton Gammon her neurosurgeon 10/22/22 Procedures:   Antimicrobials:    Subjective: Pt still feeling pretty miserable, says she "hurts all over"      Objective: Vitals:   10/26/22 1310 10/26/22 1721 10/26/22 2106 10/27/22 0501  BP: (!) 157/67 (!) 170/72 138/61 123/62  Pulse: 64 63 (!) 59 65  Resp: '18  10 12  '$ Temp: 97.6 F (36.4 C)  97.7 F (36.5 C) 98.5 F (36.9 C)  TempSrc: Oral  Oral Oral  SpO2: 99%  98% 98%  Weight:      Height:        Intake/Output Summary (Last 24 hours) at 10/27/2022 0905 Last data filed at 10/27/2022 0705 Gross per 24 hour  Intake 1484.41 ml  Output 1650 ml  Net -165.59 ml   Filed Weights   10/19/22 2058 10/19/22 2137  Weight: 50.3 kg 50.8 kg   Examination:  General exam: Appears calm and comfortable red flushing of face in cheeks. Awake but appears depressed.  Neck: in soft brace.  Respiratory system: Clear to auscultation. Respiratory effort normal. Cardiovascular system: normal S1 & S2 heard. No JVD,  murmurs, rubs, gallops or clicks. No pedal edema. Gastrointestinal system: Abdomen is nondistended, soft and nontender. No organomegaly or masses felt. Normal bowel sounds heard. Central nervous system: Alert and oriented. No focal neurological deficits. Extremities: Symmetric 5 x 5 power. Skin: No rashes, lesions or ulcers. Psychiatry: Judgement and insight appear normal. Mood & affect flat/depressed.    Data Reviewed: I have personally reviewed following labs and imaging studies  CBC: Recent Labs  Lab 10/21/22 0552 10/21/22 1327 10/22/22 0508 10/23/22 0426 10/24/22 0351 10/25/22 0508  WBC 10.6*  --  8.9 5.8 11.4* 12.7*  HGB 7.0* 6.7* 9.7* 10.3* 11.1* 10.9*  HCT 23.0* 21.8* 30.5* 32.4* 34.2* 33.6*  MCV 90.6  --  85.7 86.2 84.7 85.3  PLT 172  --  161 159 268 630    Basic Metabolic Panel: Recent Labs  Lab 10/20/22 0926 10/20/22 0926 10/21/22  3825 10/22/22 0508 10/23/22 0426 10/24/22 0351 10/25/22 0508 10/26/22 0510  NA  --    < > 140 142 145 147* 146* 139  K  --    < > 3.8 3.6 3.5 3.3* 3.2* 3.8  CL  --    < > 112* 112* 114* 115* 114* 110  CO2  --    < > 19* 22 18* 19* 24 23  GLUCOSE  --    < > 93 98 116* 222* 144* 107*  BUN  --    < > 24* 19 25* 31* 35* 39*  CREATININE  --    < > 1.65* 1.50* 1.54* 1.47* 1.25* 1.23*  CALCIUM  --    < > 8.0* 8.3* 8.6* 9.1 9.0 8.7*  MG 1.5*  --  2.5* 2.1 2.1 2.0 2.0  --   PHOS 3.1  --   --   --   --   --   --   --    < > = values in this interval not displayed.    CBG: Recent Labs  Lab 10/26/22 1626 10/26/22 1947 10/26/22 2331 10/27/22 0356 10/27/22 0743  GLUCAP 145* 121* 128* 123* 80    Recent Results (from the past 240 hour(s))  Urine Culture     Status: Abnormal   Collection Time: 10/19/22  9:10 PM   Specimen: In/Out Cath Urine  Result Value Ref Range Status   Specimen Description   Final    IN/OUT CATH URINE Performed at [facility], [address] [place], [place] [address]    Special Requests   Final     NONE Performed at [facility], [address], [place], [place] [address]    Culture 6,000 COLONIES/mL YEAST (A)  Final   Report Status 10/21/2022 FINAL  Final  Culture, blood (Routine x 2)     Status: None   Collection Time: 10/19/22  9:30 PM   Specimen: BLOOD LEFT FOREARM  Result Value Ref Range Status   Specimen Description BLOOD LEFT FOREARM  Final   Special Requests   Final    BOTTLES DRAWN AEROBIC AND ANAEROBIC Blood Culture adequate volume   Culture   Final    NO GROWTH 5 DAYS Performed at [facility], [address] [place], [place] [address]    Report Status 10/24/2022 FINAL  Final  Culture, blood (Routine x 2)     Status: None   Collection Time: 10/19/22  9:54 PM   Specimen: BLOOD LEFT HAND  Result Value Ref Range Status   Specimen Description BLOOD LEFT HAND  Final   Special Requests   Final    BOTTLES DRAWN AEROBIC ONLY Blood Culture adequate volume   Culture   Final    NO GROWTH 5 DAYS Performed at [facility], [address] [place], [place] [address]    Report Status 10/24/2022 FINAL  Final  Resp Panel by RT-PCR (Flu A&B, Covid) Anterior Nasal Swab     Status: None   Collection Time: 10/19/22 10:14 PM   Specimen: Anterior Nasal Swab  Result Value Ref Range Status   SARS Coronavirus 2 by RT PCR NEGATIVE NEGATIVE Final    Comment: (NOTE) SARS-CoV-2 target nucleic acids are NOT DETECTED.  The SARS-CoV-2 RNA is generally detectable in upper respiratory specimens during the acute phase of infection. The lowest concentration of SARS-CoV-2 viral copies this assay can detect is 138 copies/mL. A negative result does not preclude SARS-Cov-2 infection and should not be used as the  sole basis for treatment or other patient management decisions. A negative result may occur with  improper specimen collection/handling, submission of specimen other than nasopharyngeal swab, presence of viral mutation(s) within the areas targeted by this assay, and inadequate number of  viral copies(<138 copies/mL). A negative result must be combined with clinical observations, patient history, and epidemiological information. The expected result is Negative.  Fact Sheet for Patients:  EntrepreneurPulse.com.au  Fact Sheet for Healthcare Providers:  IncredibleEmployment.be  This test is no t yet approved or cleared by the [place] FDA and  has been authorized for detection and/or diagnosis of SARS-CoV-2 by FDA under an Emergency Use Authorization (EUA). This EUA will remain  in effect (meaning this test can be used) for the duration of the COVID-19 declaration under Section 564(b)(1) of the Act, 21 U.S.C.section 360bbb-3(b)(1), unless the authorization is terminated  or revoked sooner.       Influenza A by PCR NEGATIVE NEGATIVE Final   Influenza B by PCR NEGATIVE NEGATIVE Final    Comment: (NOTE) The Xpert Xpress SARS-CoV-2/FLU/RSV plus assay is intended as an aid in the diagnosis of influenza from Nasopharyngeal swab specimens and should not be used as a sole basis for treatment. Nasal washings and aspirates are unacceptable for Xpert Xpress SARS-CoV-2/FLU/RSV testing.  Fact Sheet for Patients: EntrepreneurPulse.com.au  Fact Sheet for Healthcare Providers: IncredibleEmployment.be  This test is not yet approved or cleared by the [place] FDA and has been authorized for detection and/or diagnosis of SARS-CoV-2 by FDA under an Emergency Use Authorization (EUA). This EUA will remain in effect (meaning this test can be used) for the duration of the COVID-19 declaration under Section 564(b)(1) of the Act, 21 U.S.C. section 360bbb-3(b)(1), unless the authorization is terminated or revoked.  Performed at [facility], [address] [place], [place] [address]     Radiology Studies: No results found.  Scheduled Meds:  alteplase  2 mg Intracatheter Once   Chlorhexidine Gluconate  Cloth  6 each Topical Daily   dexamethasone (DECADRON) injection  2 mg Intravenous Q12H   DULoxetine  60 mg Oral q morning   feeding supplement  237 mL Oral BID BM   gabapentin  100 mg Oral BID   gabapentin  200 mg Oral QHS   hydrALAZINE  15 mg Intravenous Q6H   insulin aspart  0-9 Units Subcutaneous Q6H   levothyroxine  75 mcg Oral QAC breakfast   metoprolol tartrate  12.5 mg Intravenous Q6H   metoprolol tartrate  5 mg Intravenous Once   mirabegron ER  50 mg Oral Daily   sodium chloride flush  10-40 mL Intracatheter Q12H   Continuous Infusions:  sodium chloride Stopped (10/25/22 0532)   dextrose 50 mL/hr at 10/27/22 0705    LOS: 8 days   Time spent: 35 mins  Meribeth Vitug Wynetta Emery, MD How to contact the [facility] Attending or Consulting provider [place] or covering provider during after hours [place], for this patient?  Check the care team in [facility] and look for a) attending/consulting TRH provider listed and b) the [facility] team listed Log into [email or web address] and use [place]'s universal password to access. If you do not have the password, please contact the hospital operator. Locate the [facility] provider you are looking for under [facility] and page to a number that you can be directly reached. If you still have difficulty reaching the provider, please page the [facility] (Director on Call) for the Hospitalists listed on amion for assistance.  10/27/2022, 9:05  AM  

## 2022-10-28 DIAGNOSIS — M4802 Spinal stenosis, cervical region: Secondary | ICD-10-CM | POA: Diagnosis not present

## 2022-10-28 DIAGNOSIS — D649 Anemia, unspecified: Secondary | ICD-10-CM | POA: Diagnosis not present

## 2022-10-28 DIAGNOSIS — J189 Pneumonia, unspecified organism: Secondary | ICD-10-CM | POA: Diagnosis not present

## 2022-10-28 DIAGNOSIS — J9601 Acute respiratory failure with hypoxia: Secondary | ICD-10-CM | POA: Diagnosis not present

## 2022-10-28 LAB — COMPREHENSIVE METABOLIC PANEL
ALT: 33 U/L (ref 0–44)
AST: 28 U/L (ref 15–41)
Albumin: 3.1 g/dL — ABNORMAL LOW (ref 3.5–5.0)
Alkaline Phosphatase: 74 U/L (ref 38–126)
Anion gap: 9 (ref 5–15)
BUN: 33 mg/dL — ABNORMAL HIGH (ref 8–23)
CO2: 23 mmol/L (ref 22–32)
Calcium: 8.8 mg/dL — ABNORMAL LOW (ref 8.9–10.3)
Chloride: 105 mmol/L (ref 98–111)
Creatinine, Ser: 1.29 mg/dL — ABNORMAL HIGH (ref 0.44–1.00)
GFR, Estimated: 38 mL/min — ABNORMAL LOW (ref >60)
Glucose, Bld: 134 mg/dL — ABNORMAL HIGH (ref 70–99)
Potassium: 3.2 mmol/L — ABNORMAL LOW (ref 3.5–5.1)
Sodium: 137 mmol/L (ref 135–145)
Total Bilirubin: 0.8 mg/dL (ref 0.3–1.2)
Total Protein: 5.8 g/dL — ABNORMAL LOW (ref 6.5–8.1)

## 2022-10-28 LAB — GLUCOSE, CAPILLARY
Glucose-Capillary: 150 mg/dL — ABNORMAL HIGH (ref 70–99)
Glucose-Capillary: 106 mg/dL — ABNORMAL HIGH (ref 70–99)
Glucose-Capillary: 127 mg/dL — ABNORMAL HIGH (ref 70–99)
Glucose-Capillary: 161 mg/dL — ABNORMAL HIGH (ref 70–99)

## 2022-10-28 LAB — MAGNESIUM: Magnesium: 1.8 mg/dL (ref 1.7–2.4)

## 2022-10-28 LAB — TRIGLYCERIDES: Triglycerides: 59 mg/dL (ref <150)

## 2022-10-28 LAB — PHOSPHORUS: Phosphorus: 2.7 mg/dL (ref 2.5–4.6)

## 2022-10-28 MED ORDER — LEVOTHYROXINE SODIUM 100 MCG/5ML IV SOLN
37.5000 ug | Freq: Once | INTRAVENOUS | Status: AC
  Administered 2022-10-28: 37.5 ug via INTRAVENOUS
  Filled 2022-10-28: qty 5

## 2022-10-28 MED ORDER — INSULIN ASPART 100 UNIT/ML IJ SOLN: 0.0000 [IU] | Freq: Four times a day (QID) | INTRAMUSCULAR | Status: DC

## 2022-10-28 MED ORDER — MAGNESIUM SULFATE 2 GM/50ML IV SOLN
2.0000 g | Freq: Once | INTRAVENOUS | Status: AC
  Administered 2022-10-28: 2 g via INTRAVENOUS
  Filled 2022-10-28: qty 50

## 2022-10-28 MED ORDER — PANTOPRAZOLE SODIUM 40 MG IV SOLR
40.0000 mg | INTRAVENOUS | Status: DC
  Administered 2022-10-28 – 2022-10-29 (×2): 40 mg via INTRAVENOUS
  Filled 2022-10-28 (×2): qty 10

## 2022-10-28 MED ORDER — TRAVASOL 10 % IV SOLN
INTRAVENOUS | Status: AC
  Filled 2022-10-28: qty 480

## 2022-10-28 MED ORDER — POTASSIUM CHLORIDE 10 MEQ/100ML IV SOLN
10.0000 meq | INTRAVENOUS | Status: AC
  Administered 2022-10-28 (×4): 10 meq via INTRAVENOUS
  Filled 2022-10-28: qty 100

## 2022-10-28 NOTE — TOC Progression Note (Signed)
Transition of Care [facility]) - Progression Note    Patient Details  Name: Jane Andrews MRN: [id number] Date of Birth: 1925-11-23  [facility] CM/SW Contact  Salome Arnt, [place] Phone Number: 10/28/2022, 11:03 AM  Clinical Narrative:  [facility] reviewed this morning. They can accept unless pt's needs change. Pt will have G tube placement tomorrow. Pt's niece, [name] accepts. TOC will continue to follow.       Barriers to Discharge: Continued Medical Work up  Expected Discharge Plan and Services   In-house Referral: Clinical Social Work     Living arrangements for the past 2 months: Single Family Home                                       Social Determinants of Health (SDOH) Interventions    Readmission Risk Interventions    10/21/2022    9:21 AM 06/15/2020   10:21 AM  Readmission Risk Prevention Plan  Medication Screening  Complete  Transportation Screening Complete Complete  Medication Review (RN Care Manager) Complete   HRI or [place] Complete   SW Recovery Care/Counseling Consult Complete   Palliative Care Screening Not [place] Not Applicable

## 2022-10-28 NOTE — Progress Notes (Addendum)
PROGRESS NOTE   Jane Andrews  PFX:[id number] DOB: [birth year] DOA: 10/19/2022 PCP: Redmond School, MD   Chief Complaint  Patient presents with   Shortness of Breath   Level of care: Telemetry  Brief Admission History:  88 y.o. 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  Patient was laying in bed with neck brace and most of the history was obtained from ED physician and niece at the bedside.  Per report, patient presented with cough and congestion a few days ago, her physician was contacted and recommended giving Mucinex, patient's aide called the niece this afternoon that patient was vomiting, niece activated EMS and on arrival of EMS team, she was noted to be hypoxic with an O2 sat of 84% on room air, NRB at 15 L was provided with improvement in oxygenation to 98%.  Patient was tachycardic at rate of 136, she was febrile with a temperature of 102F (orally).  Code sepsis was activated en route to the ED.   ED Course:  In the emergency department, temperature was 98.6 F, respiration was 25/min, HR 138 bpm, BP was 110/65, O2 sat was 94% on supplemental oxygen at 2 LPM.  Work-up in the ED showed normocytic anemia with WBC of 11.4, BMP showed sodium 136, potassium 4.3, chloride 107, bicarb 20, glucose 116, BUN 30, creatinine 1.88 (baseline creatinine at 1.5-1.7).  Albumin 3.0, AST 42, ALT 14, ALP 117.  Lactic acid was 1.8 > 1.4.  Blood culture pending, influenza A, B, SARS coronavirus 2 was negative.  Chest x-ray showed new  patchy airspace disease in both lung bases consistent with bilateral pneumonia or aspiration. Minimal pleural effusions. He was treated with IV ceftriaxone and azithromycin, IV hydration was provided.  Hospitalist was asked to  admit patient for further evaluation and management.    Assessment and Plan:  [place] / Aspiration pneumonia Afebrile. Patient does have an elevated WBC (11.4) but patient is receiving IV Decadron. Patient without acute signs of infection. - IV Unasyn course completed - supportive measures   Dysphagia  - severe - postop complication of ACDF - discussed with neurosurgeon Dr. Trenton Gammon - he recommended IV decadron 4 mg IV Q6h which we did for 5 days but no meaningful improvement in her ability to swallow now weaning down decadron - feels patient should improve in 3-5 days, then can rechallenge orally, unfortunately this did not work and she will need to remain NPO. - continue NPO status for now -SLP following and will plan on repeat MBSS (Monday) 11/13 -->failed study, ongoing aspiration seen, recommended alternative means for nutrition  -Discussed with surgery, unable to pass scope due to severe edema; consulted to IR for image guided G tube placement  -IR made arrangements for transfer to [facility] on 11/15 in AM for image guided G tube placement  -TNA started 10/28/22 per pharmD, thru PICC line, hopefully if G tube placement successful hopefully we can stop TNA and begin enteral feeding.     Stage IV CKD - stable, following    Persistent Atrial Fibrillation  - unable to take oral metoprolol - continue IV lopressor 12.5 mg Q6h (see below) - per cardiologist, not a candidate for anticoagulation   Hypothyroidism  - I have asked the pharmacy if she can have a dose of IV levothyroxine as she  has been without oral dose for 1 week now - can give dose today levothyroxine 37.5 mcg x 1 dose 11/12, repeat dose 11/14   Overactive Bladder - unable to resume home myrbetriq until she can take p.o. again, if passes swallow eval, would be able to restart today   Cervical stenosis s/p ACDF - discussed with neurosurgeon Dr. Trenton Gammon on 11/8 - he recommended starting IV decadron 4 mg every 6 hours on 11/8, reducing  dose to Q12 hours on 11/12, he felt the edema was an expected complication of the surgery and could improve in 3-5 days however we have not seen that improvement - continue neck brace and plan for outpatient follow up with Dr. Trenton Gammon for wound check - IV pain management until able to take p.o.    UTI - asymptomatic, DC IV fluconazole    Hypertension - uncontrolled - persistently elevated BP likely elevated due to IV steroids which we are weaning down   - Patient with SBP consistently in upper 160's. Likely secondary to pain and withdrawal from home regimen of Xanax. -IV Hydralazine ordered and Metoprolol increased to 12.5 mg q6, increased IV hydralazine in attempt to improve blood pressures -IV Ativan ordered -Continue to monitor   Hyperglycemia - Steroid induced  Secondary to IV Decadron. Continue SSI coverage and CBG monitoring BS coming down as decadron dose coming down  CBG (last 3)  Recent Labs    10/27/22 1714 10/27/22 2328 10/28/22 0559  GLUCAP 126* 126* 150*   Depression / Adjustment Disorder - Pt has been unable to take her antidepressives for over a week now due to severe dysphagia - hopefully she will pass swallow test today and diet can be advanced and she can resume her antidepressants - we did give her an IV dose of levothyroxine on 11/12, 11/14     DVT prophylaxis: SCDs Code Status: DNR  Family Communication: bedside updates daily; telephone call niece 11/14 about current plan of care, she verbalized understanding Disposition: Status is: Inpatient Remains inpatient appropriate because: IV decadron, IV fluid   Consultants:  Telephone call Dr. Trenton Gammon her neurosurgeon 10/22/22 Procedures:   Antimicrobials:    Subjective: Pt still feeling pretty miserable, says she "hurts all over"      Objective: Vitals:   10/27/22 1703 10/27/22 2114 10/27/22 2327 10/28/22 0537  BP: (!) 154/63 (!) 153/78 (!) 159/75 (!) 166/81  Pulse:  79 75 74  Resp:  19  17  Temp:  98.6 F  (37 C)  (!) 97.3 F (36.3 C)  TempSrc:    Oral  SpO2:  95%  96%  Weight:      Height:        Intake/Output Summary (Last 24 hours) at 10/28/2022 1048 Last data filed at 10/28/2022 0705 Gross per 24 hour  Intake 423.08 ml  Output 500 ml  Net -76.92 ml   Filed Weights   10/19/22 2058 10/19/22 2137  Weight: 50.3 kg 50.8 kg   Examination:  General exam: Appears calm and comfortable red flushing of face in cheeks. Awake but appears depressed.  Neck: in soft brace.  Respiratory system: Clear to auscultation. Respiratory effort normal. Cardiovascular system: normal S1 & S2 heard. No JVD, murmurs, rubs, gallops or clicks. No pedal edema. Gastrointestinal system: Abdomen is nondistended, soft and nontender. No organomegaly or masses felt. Normal bowel sounds heard. Central nervous system: Alert and oriented. No focal neurological deficits. Extremities: Symmetric 5 x 5 power. Skin: No rashes, lesions or ulcers. Psychiatry: Judgement and  insight appear normal. Mood & affect flat/depressed.    Data Reviewed: I have personally reviewed following labs and imaging studies  CBC: Recent Labs  Lab 10/21/22 1327 10/22/22 0508 10/23/22 0426 10/24/22 0351 10/25/22 0508  WBC  --  8.9 5.8 11.4* 12.7*  HGB 6.7* 9.7* 10.3* 11.1* 10.9*  HCT 21.8* 30.5* 32.4* 34.2* 33.6*  MCV  --  85.7 86.2 84.7 85.3  PLT  --  161 159 268 381    Basic Metabolic Panel: Recent Labs  Lab 10/22/22 0508 10/23/22 0426 10/24/22 0351 10/25/22 0508 10/26/22 0510 10/28/22 0430  NA 142 145 147* 146* 139 137  K 3.6 3.5 3.3* 3.2* 3.8 3.2*  CL 112* 114* 115* 114* 110 105  CO2 22 18* 19* '24 23 23  '$ GLUCOSE 98 116* 222* 144* 107* 134*  BUN 19 25* 31* 35* 39* 33*  CREATININE 1.50* 1.54* 1.47* 1.25* 1.23* 1.29*  CALCIUM 8.3* 8.6* 9.1 9.0 8.7* 8.8*  MG 2.1 2.1 2.0 2.0  --  1.8  PHOS  --   --   --   --   --  2.7    CBG: Recent Labs  Lab 10/27/22 0743 10/27/22 1120 10/27/22 1714 10/27/22 2328  10/28/22 0559  GLUCAP 80 114* 126* 126* 150*    Recent Results (from the past 240 hour(s))  Urine Culture     Status: Abnormal   Collection Time: 10/19/22  9:10 PM   Specimen: In/Out Cath Urine  Result Value Ref Range Status   Specimen Description   Final    IN/OUT CATH URINE Performed at [facility], [address] [place], [place] [address]    Special Requests   Final    NONE Performed at [facility], [address] [place], [place] [address]    Culture 6,000 COLONIES/mL YEAST (A)  Final   Report Status 10/21/2022 FINAL  Final  Culture, blood (Routine x 2)     Status: None   Collection Time: 10/19/22  9:30 PM   Specimen: BLOOD LEFT FOREARM  Result Value Ref Range Status   Specimen Description BLOOD LEFT FOREARM  Final   Special Requests   Final    BOTTLES DRAWN AEROBIC AND ANAEROBIC Blood Culture adequate volume   Culture   Final    NO GROWTH 5 DAYS Performed at [facility], [address], [place], [place] [address]    Report Status 10/24/2022 FINAL  Final  Culture, blood (Routine x 2)     Status: None   Collection Time: 10/19/22  9:54 PM   Specimen: BLOOD LEFT HAND  Result Value Ref Range Status   Specimen Description BLOOD LEFT HAND  Final   Special Requests   Final    BOTTLES DRAWN AEROBIC ONLY Blood Culture adequate volume   Culture   Final    NO GROWTH 5 DAYS Performed at [facility], [address], [place], [place] [address]    Report Status 10/24/2022 FINAL  Final  Resp Panel by RT-PCR (Flu A&B, Covid) Anterior Nasal Swab     Status: None   Collection Time: 10/19/22 10:14 PM   Specimen: Anterior Nasal Swab  Result Value Ref Range Status   SARS Coronavirus 2 by RT PCR NEGATIVE NEGATIVE Final    Comment: (NOTE) SARS-CoV-2 target nucleic acids are NOT DETECTED.  The SARS-CoV-2 RNA is generally detectable in upper respiratory specimens during the acute phase of infection. The lowest concentration of SARS-CoV-2 viral copies this assay can detect is 138  copies/mL. A negative  result does not preclude SARS-Cov-2 infection and should not be used as the sole basis for treatment or other patient management decisions. A negative result may occur with  improper specimen collection/handling, submission of specimen other than nasopharyngeal swab, presence of viral mutation(s) within the areas targeted by this assay, and inadequate number of viral copies(<138 copies/mL). A negative result must be combined with clinical observations, patient history, and epidemiological information. The expected result is Negative.  Fact Sheet for Patients:  EntrepreneurPulse.com.au  Fact Sheet for Healthcare Providers:  IncredibleEmployment.be  This test is no t yet approved or cleared by the [place] FDA and  has been authorized for detection and/or diagnosis of SARS-CoV-2 by FDA under an Emergency Use Authorization (EUA). This EUA will remain  in effect (meaning this test can be used) for the duration of the COVID-19 declaration under Section 564(b)(1) of the Act, 21 U.S.C.section 360bbb-3(b)(1), unless the authorization is terminated  or revoked sooner.       Influenza A by PCR NEGATIVE NEGATIVE Final   Influenza B by PCR NEGATIVE NEGATIVE Final    Comment: (NOTE) The Xpert Xpress SARS-CoV-2/FLU/RSV plus assay is intended as an aid in the diagnosis of influenza from Nasopharyngeal swab specimens and should not be used as a sole basis for treatment. Nasal washings and aspirates are unacceptable for Xpert Xpress SARS-CoV-2/FLU/RSV testing.  Fact Sheet for Patients: EntrepreneurPulse.com.au  Fact Sheet for Healthcare Providers: IncredibleEmployment.be  This test is not yet approved or cleared by the [place] FDA and has been authorized for detection and/or diagnosis of SARS-CoV-2 by FDA under an Emergency Use Authorization (EUA). This EUA will remain in effect (meaning  this test can be used) for the duration of the COVID-19 declaration under Section 564(b)(1) of the Act, 21 U.S.C. section 360bbb-3(b)(1), unless the authorization is terminated or revoked.  Performed at [facility], [address] [place], [place] [address]     Radiology Studies: No results found.  Scheduled Meds:  alteplase  2 mg Intracatheter Once   Chlorhexidine Gluconate Cloth  6 each Topical Daily   dexamethasone (DECADRON) injection  2 mg Intravenous Q12H   DULoxetine  60 mg Oral q morning   gabapentin  100 mg Oral BID   gabapentin  200 mg Oral QHS   hydrALAZINE  15 mg Intravenous Q6H   insulin aspart  0-9 Units Subcutaneous Q6H   levothyroxine  75 mcg Oral QAC breakfast   metoprolol tartrate  12.5 mg Intravenous Q6H   metoprolol tartrate  5 mg Intravenous Once   mirabegron ER  50 mg Oral Daily   sodium chloride flush  10-40 mL Intracatheter Q12H   Continuous Infusions:  sodium chloride Stopped (10/25/22 0532)   dextrose 50 mL/hr at 10/28/22 0254   potassium chloride 10 mEq (10/28/22 0948)   TPN ADULT (ION)      LOS: 9 days   Time spent: 35 mins  Zakkery Dorian Wynetta Emery, MD How to contact the [facility] Attending or Consulting provider [place] or covering provider during after hours [place], for this patient?  Check the care team in [facility] and look for a) attending/consulting [facility] provider listed and b) the [facility] team listed Log into [email or web address] and use [facility]'s universal password to access. If you do not have the password, please contact the hospital operator. Locate the [facility] provider you are looking for under [facility] and page to a number that you can be directly reached. If you still have difficulty reaching the provider, please  page the [facility] (Director on Call) for the Hospitalists listed on amion for assistance.  10/28/2022, 10:48 AM

## 2022-10-28 NOTE — Progress Notes (Signed)
Nutrition Follow up   DOCUMENTATION CODES:   Severe malnutrition in context of acute illness/injury  INTERVENTION:  TPN per pharmacy  Request current weight  Monitor magnesium, potassium, and phosphorus BID for at least 3 days, MD to replete as needed.  NUTRITION DIAGNOSIS:  Severe Malnutrition related to inability to eat as evidenced by NPO status (7 days.), moderate muscle loss.   GOAL:   (based on patient healthcare goals)  MONITOR:  Diet advancement, Labs, MBSS results, Healthcare decisions  ASSESSMENT: Patient is a 88 yo female presents with CAP. PMH: GERD (hiatal hernia), short gut syndrome (2004), anemia, HTN.  11/3 s/p- Anterior Cervical Discectomy and Fusion (ACDF) C3-4, C4-5  11/7 SLP bedside swallow evaluation- recommending NPO.   11/7-Patient had (16 fr) -OGT with external length (66 cm) and x-ray to confirm placement pending. Enteral feeding initiation in process.   11/13 Patient lying in bed with eyes closed. Family (POA) is bedside waiting results of MBSS. There has been no NGT and family reports no nutrition since admission.  LOS day 7. NGT was attempted.  Family member hoping for diet to be able to advance.   11/14 per chart radiology unable to place NGT. Surgery recommending  PICC placement and TPN initiation. Pending PEG placement based on discussion during rounds. Going to [facility] tomorrow for PEG. Request for current weight to assess change the past week. If patient will not be on TPN for at least 5 days, then would not recommend initiation. Add additional thiamine and folic acid to TPN due to refeeding risk.   IVF- D5 @ 50 ml/hr (provides-~ 200 kcal/d)   Intake/Output Summary (Last 24 hours) at 10/28/2022 0828 Last data filed at 10/28/2022 0705 Gross per 24 hour  Intake 423.08 ml  Output 500 ml  Net -76.92 ml     Medications: lopressor, ferric gluconate     Latest Ref Rng & Units 10/28/2022    4:30 AM 10/26/2022    5:10 AM 10/25/2022    5:08 AM   BMP  Glucose 70 - 99 mg/dL 134  107  144   BUN 8 - 23 mg/dL 33  39  35   Creatinine 0.44 - 1.00 mg/dL 1.29  1.23  1.25   Sodium 135 - 145 mmol/L 137  139  146   Potassium 3.5 - 5.1 mmol/L 3.2  3.8  3.2   Chloride 98 - 111 mmol/L 105  110  114   CO2 22 - 32 mmol/L '23  23  24   '$ Calcium 8.9 - 10.3 mg/dL 8.8  8.7  9.0      Diet Order:   Diet Order             Diet NPO time specified  Diet effective now                   EDUCATION NEEDS:  Education needs have been addressed  Skin:  Skin Assessment: Reviewed RN Assessment Incision on neck from 11/3   Last BM:  11/12 type 6 small  Height:   Ht Readings from Last 1 Encounters:  10/19/22 '5\' 1"'$  (1.549 m)    Weight:   Wt Readings from Last 1 Encounters:  10/19/22 50.8 kg    Ideal Body Weight:   48 kg  BMI:  Body mass index is 21.16 kg/m.  Estimated Nutritional Needs:   Kcal:  7035-0093  Protein:  87-95 gr  Fluid:  > 1.5 liters daily  Colman Cater MS,RD,CSG,LDN Contact: Shea Evans

## 2022-10-28 NOTE — Progress Notes (Signed)
SLP Cancellation Note  Patient Details Name: Jane Andrews MRN: [id number] DOB: [birth year]   Cancelled treatment:       Reason Eval/Treat Not Completed: Other (comment) (Pt going for PEG tomorrow. SLP will follow after that and recommend f/u at SNF for dysphagia therapy.)  Thank you,  Genene Churn, [place] 10/28/2022, 5:11 PM

## 2022-10-28 NOTE — Progress Notes (Signed)
Patient was not given any oral medications during this shift due to NPO diet status.

## 2022-10-28 NOTE — Progress Notes (Signed)
PHARMACY - TOTAL PARENTERAL NUTRITION CONSULT NOTE   Indication: intolerance to enteral feed  Patient Measurements: Height: '5\' 1"'$  (154.9 cm) Weight: 50.8 kg (112 lb) IBW/kg (Calculated) : 47.8 TPN AdjBW (KG): 50.8 Body mass index is 21.16 kg/m. Usual Weight:   Assessment:   Glucose / Insulin: 114-150 Electrolytes: K 3.2 Renal: Scr 1.29 Hepatic: WNL Intake / Output; MIVF: D5W @ 50 mL/hr GI Imagine/Surgeries / Procedures:   Central access: PICC 11/6 TPN start date: 11/14  Nutritional Goals: Goal TPN rate is 70 mL/hr (provides 50 g of protein and 1528 kcals per day)  RD Assessment: Estimated Needs Total Energy Estimated Needs: 7048-8891 Total Protein Estimated Needs: 77-86 gr Total Fluid Estimated Needs: > 1.5 liters daily  Current Nutrition:  Oral supplements  Plan:  IV potassium 10 mEq x 4   Start TPN at 40 mL/hr at 1800 Electrolytes in TPN: Na 100mq/L, K 528m/L, Ca 81m44mL, Mg 81mE68m, and Phos 181mm64m. Cl:Ac 1:1 Add standard MVI and trace elements to TPN Initiate Sensitive q6h SSI and adjust as needed  Stop MIVF at 1800 Monitor TPN labs on Mon/Thurs,   SteveMargot AblesrmD Clinical Pharmacist 10/28/2022 8:55 AM

## 2022-10-28 NOTE — Progress Notes (Signed)
IR was requested for G tube placement.   Case reviewed by Dr. Maryelizabeth Kaufmann and Dr. Pascal Lux, approved for image guided G tube placement.  Plan for tomorrow, patient needs to be at [facility] IR by 8:30 tomorrow.  RN/MD notified, RN to arrange [facility].   The procedure is tentatively scheduled for tomorrow at [facility].   PLAN  - Patient has been NPO due to poor oral intake and risk of aspiration, to be remain NPO until G tube placement is done.  - Pt not on AC/AP, INR on 11/5 1.0. - please refrain from starting AC/AP  - Formal consult to follow tomorrow   Please call IR for questions and concerns.    Armando Gang Shakti Fleer PA-C 10/28/2022 8:40 AM

## 2022-10-29 ENCOUNTER — Ambulatory Visit (HOSPITAL_COMMUNITY): Payer: Medicare Other

## 2022-10-29 ENCOUNTER — Encounter (HOSPITAL_COMMUNITY): Payer: Self-pay | Admitting: Internal Medicine

## 2022-10-29 DIAGNOSIS — E039 Hypothyroidism, unspecified: Secondary | ICD-10-CM | POA: Insufficient documentation

## 2022-10-29 DIAGNOSIS — I129 Hypertensive chronic kidney disease with stage 1 through stage 4 chronic kidney disease, or unspecified chronic kidney disease: Secondary | ICD-10-CM | POA: Insufficient documentation

## 2022-10-29 DIAGNOSIS — F32A Depression, unspecified: Secondary | ICD-10-CM | POA: Insufficient documentation

## 2022-10-29 DIAGNOSIS — I4819 Other persistent atrial fibrillation: Secondary | ICD-10-CM | POA: Insufficient documentation

## 2022-10-29 DIAGNOSIS — J690 Pneumonitis due to inhalation of food and vomit: Secondary | ICD-10-CM | POA: Insufficient documentation

## 2022-10-29 DIAGNOSIS — R1310 Dysphagia, unspecified: Secondary | ICD-10-CM | POA: Insufficient documentation

## 2022-10-29 DIAGNOSIS — J9601 Acute respiratory failure with hypoxia: Secondary | ICD-10-CM | POA: Insufficient documentation

## 2022-10-29 DIAGNOSIS — F419 Anxiety disorder, unspecified: Secondary | ICD-10-CM | POA: Insufficient documentation

## 2022-10-29 DIAGNOSIS — N1832 Chronic kidney disease, stage 3b: Secondary | ICD-10-CM | POA: Insufficient documentation

## 2022-10-29 DIAGNOSIS — D649 Anemia, unspecified: Secondary | ICD-10-CM | POA: Insufficient documentation

## 2022-10-29 DIAGNOSIS — I482 Chronic atrial fibrillation, unspecified: Secondary | ICD-10-CM | POA: Diagnosis not present

## 2022-10-29 HISTORY — PX: IR GASTROSTOMY TUBE MOD SED: IMG625

## 2022-10-29 LAB — COMPREHENSIVE METABOLIC PANEL
ALT: 25 U/L (ref 0–44)
AST: 19 U/L (ref 15–41)
Albumin: 2.9 g/dL — ABNORMAL LOW (ref 3.5–5.0)
Alkaline Phosphatase: 73 U/L (ref 38–126)
Anion gap: 7 (ref 5–15)
BUN: 36 mg/dL — ABNORMAL HIGH (ref 8–23)
CO2: 22 mmol/L (ref 22–32)
Calcium: 8.4 mg/dL — ABNORMAL LOW (ref 8.9–10.3)
Chloride: 107 mmol/L (ref 98–111)
Creatinine, Ser: 1.32 mg/dL — ABNORMAL HIGH (ref 0.44–1.00)
GFR, Estimated: 37 mL/min — ABNORMAL LOW (ref >60)
Glucose, Bld: 163 mg/dL — ABNORMAL HIGH (ref 70–99)
Potassium: 4 mmol/L (ref 3.5–5.1)
Sodium: 136 mmol/L (ref 135–145)
Total Bilirubin: 0.6 mg/dL (ref 0.3–1.2)
Total Protein: 5.8 g/dL — ABNORMAL LOW (ref 6.5–8.1)

## 2022-10-29 LAB — GLUCOSE, CAPILLARY
Glucose-Capillary: 133 mg/dL — ABNORMAL HIGH (ref 70–99)
Glucose-Capillary: 156 mg/dL — ABNORMAL HIGH (ref 70–99)
Glucose-Capillary: 160 mg/dL — ABNORMAL HIGH (ref 70–99)

## 2022-10-29 LAB — PHOSPHORUS: Phosphorus: 3 mg/dL (ref 2.5–4.6)

## 2022-10-29 LAB — MAGNESIUM: Magnesium: 2.4 mg/dL (ref 1.7–2.4)

## 2022-10-29 MED ORDER — MIDAZOLAM HCL 2 MG/2ML IJ SOLN
INTRAMUSCULAR | Status: AC | PRN
  Administered 2022-10-29: .5 mg via INTRAVENOUS

## 2022-10-29 MED ORDER — TRAVASOL 10 % IV SOLN
INTRAVENOUS | Status: AC
  Filled 2022-10-29: qty 840

## 2022-10-29 MED ORDER — IOHEXOL 300 MG/ML  SOLN
50.0000 mL | Freq: Once | INTRAMUSCULAR | Status: AC | PRN
  Administered 2022-10-29: 10 mL

## 2022-10-29 MED ORDER — CEFAZOLIN SODIUM-DEXTROSE 2-4 GM/100ML-% IV SOLN
INTRAVENOUS | Status: AC | PRN
  Administered 2022-10-29: 2 g via INTRAVENOUS

## 2022-10-29 MED ORDER — FENTANYL CITRATE (PF) 100 MCG/2ML IJ SOLN
INTRAMUSCULAR | Status: AC | PRN
  Administered 2022-10-29 (×2): 12.5 ug via INTRAVENOUS

## 2022-10-29 MED ORDER — GLUCAGON HCL RDNA (DIAGNOSTIC) 1 MG IJ SOLR
INTRAMUSCULAR | Status: AC | PRN
  Administered 2022-10-29: 1 mg via INTRAVENOUS

## 2022-10-29 NOTE — Sedation Documentation (Signed)
Incontinent of bowel and bladder x 2 last hour, loose stool, pericare given and taken to IR suite.

## 2022-10-29 NOTE — Progress Notes (Signed)
[facility] called, report given to [place].

## 2022-10-29 NOTE — Progress Notes (Signed)
Nutrition Follow up  DOCUMENTATION CODES:   Severe malnutrition in context of acute illness/injury  INTERVENTION:  TPN per pharmacy  When pt is ready to start tube feeding recommend: -Jevity 1.2 @ 25 ml/hr per PEG -advance 10 ml/hr every 12 hr to goal rate 55 ml/hr  -Add ProSource TF20 (60 ml) daily per tube  Provides: 800 kcal, 53 gr protein, 484 ml water (at 25 ml/hr)  Multivitamin daily (5 ml) per tube (once TPN is discontinued)  Monitor magnesium, potassium, and phosphorus BID for at least 3 days, MD to replete as needed, as pt is at risk for refeeding syndrome given prolonged NPO status.   NUTRITION DIAGNOSIS:  Severe Malnutrition related to inability to eat as evidenced by NPO status (7 days.), moderate muscle loss.   GOAL:   (based on patient healthcare goals)  MONITOR:  Diet advancement, Labs, MBSS results, Healthcare decisions  ASSESSMENT: Patient is a 88 yo female presents with CAP. PMH: GERD (hiatal hernia), short gut syndrome (2004), anemia, HTN.  11/3 s/p- Anterior Cervical Discectomy and Fusion (ACDF) C3-4, C4-5  11/7 SLP bedside swallow evaluation- recommending NPO.   11/7-Patient had (16 fr) -OGT with external length (66 cm) and x-ray to confirm placement pending. Enteral feeding initiation in process.   11/13 Patient lying in bed with eyes closed. Family (POA) is bedside waiting results of MBSS. There has been no NGT and family reports no nutrition since admission.  LOS day 7. NGT was attempted.  Family member hoping for diet to be able to advance. No PEG recommended given her age.   11/15- pt gone to [facility] today for PEG placement. Tube has been successfully placed per chart and pt can begin tube feeds per primary team. Patient TPN @ 40 ml/hr the first bag and advanced to 70 ml/hr today at 1800 (provides 1528 kcal, 50 gr protein).   IVF- D5 @ 50 ml/hr (provides-~ 200 kcal/d)  Intake/Output Summary (Last 24 hours) at 10/29/2022 1512 Last data filed at  10/29/2022 0606 Gross per 24 hour  Intake --  Output 200 ml  Net -200 ml    Medications: lopressor, ferric gluconate     Latest Ref Rng & Units 10/29/2022    6:28 AM 10/28/2022    4:30 AM 10/26/2022    5:10 AM  BMP  Glucose 70 - 99 mg/dL 163  134  107   BUN 8 - 23 mg/dL 36  33  39   Creatinine 0.44 - 1.00 mg/dL 1.32  1.29  1.23   Sodium 135 - 145 mmol/L 136  137  139   Potassium 3.5 - 5.1 mmol/L 4.0  3.2  3.8   Chloride 98 - 111 mmol/L 107  105  110   CO2 22 - 32 mmol/L '22  23  23   '$ Calcium 8.9 - 10.3 mg/dL 8.4  8.8  8.7      Diet Order:   Diet Order             Diet NPO time specified  Diet effective now                   EDUCATION NEEDS:  Education needs have been addressed  Skin:  Skin Assessment: Reviewed RN Assessment Incision on neck from 11/3   Last BM:  11/13  Height:   Ht Readings from Last 1 Encounters:  10/19/22 '5\' 1"'$  (1.549 m)    Weight:   Wt Readings from Last 1 Encounters:  10/28/22 53.1 kg  Ideal Body Weight:   48 kg  BMI:  Body mass index is 22.11 kg/m.  Estimated Nutritional Needs:   Kcal:  2334-3568  Protein:  87-95 gr  Fluid:  > 1.5 liters daily  Colman Cater MS,RD,CSG,LDN Contact: Shea Evans

## 2022-10-29 NOTE — Progress Notes (Signed)
PROGRESS NOTE  DANNY ZIMNY CVE:[id number] DOB: 01-14-1925 DOA: 10/19/2022 PCP: Redmond School, MD  Brief History:  88 y.o. 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  Patient was laying in bed with neck brace and most of the history was obtained from ED physician and niece at the bedside.  Per report, patient presented with cough and congestion a few days ago, her physician was contacted and recommended giving Mucinex, patient's aide called the niece this afternoon that patient was vomiting, niece activated EMS and on arrival of EMS team, she was noted to be hypoxic with an O2 sat of 84% on room air, NRB at 15 L was provided with improvement in oxygenation to 98%.  Patient was tachycardic at rate of 136, she was febrile with a temperature of 102F (orally).  Code sepsis was activated en route to the ED.   ED Course:  In the emergency department, temperature was 98.6 F, respiration was 25/min, HR 138 bpm, BP was 110/65, O2 sat was 94% on supplemental oxygen at 2 LPM.  Work-up in the ED showed normocytic anemia with WBC of 11.4, BMP showed sodium 136, potassium 4.3, chloride 107, bicarb 20, glucose 116, BUN 30, creatinine 1.88 (baseline creatinine at 1.5-1.7).  Albumin 3.0, AST 42, ALT 14, ALP 117.  Lactic acid was 1.8 > 1.4.  Blood culture pending, influenza A, B, SARS coronavirus 2 was negative.  Chest x-ray showed new  patchy airspace disease in both lung bases consistent with bilateral pneumonia or aspiration. Minimal pleural effusions. He was treated with IV ceftriaxone and azithromycin, IV hydration was provided.  Hospitalist was asked to admit patient for further evaluation and management.     Assessment/Plan: Acute Respiratory  Failure with Hypoxia/ Aspiration pneumonia -now stable on RA - IV Unasyn course completed 11/9 - supportive measures   Dysphagia  - severe - postop complication of ACDF - discussed with neurosurgeon Dr. Trenton Gammon - he recommended IV decadron 4 mg IV Q6h which we did for 5 days but no meaningful improvement in her ability to swallow now weaning down decadron - initially felt patient should improve in 3-5 days, then can rechallenge orally, unfortunately this did not work and she will need to remain NPO. - continue NPO status for now -SLP following and will plan on repeat MBSS (Monday) 11/13 -->failed study, ongoing aspiration seen, recommended alternative means for nutrition  -Discussed with surgery, unable to pass scope due to severe edema; consulted to IR for image guided G tube placement  -IR made arrangements for transfer to [facility] on 11/15 in AM for image guided G tube placement  -TNA started 10/28/22 per pharmD, thru PICC line, hopefully if G tube placement successful hopefully we can stop TNA and begin enteral feeding.   -gastrostomy tube placed 11/15 -plan to start enteral feeding 11/16   Stage 3b CKD - stable, following  -baseline creatinine 1.2-1.5   Persistent Atrial Fibrillation  - unable to take oral metoprolol - continue IV lopressor 12.5 mg Q6h (see below) - per cardiologist, not a candidate for anticoagulation   Hypothyroidism  - I have asked the pharmacy if she can have a dose of IV levothyroxine as she has been without oral dose for 1 week now - can give dose  today levothyroxine 37.5 mcg x 1 dose 11/12, repeat dose 11/14 -plan to restart po levothyroxine per tube 11/16   Overactive Bladder - unable to resume home myrbetriq until she can take p.o. again>>per tube 11/16   Cervical stenosis s/p ACDF - discussed with neurosurgeon Dr. Trenton Gammon on 11/8 - he recommended starting IV decadron 4 mg every 6 hours on 11/8, reducing dose to Q12 hours on 11/12, he felt the edema was an  expected complication of the surgery and could improve in 3-5 days however we have not seen that improvement - continue neck brace and plan for outpatient follow up with Dr. Trenton Gammon for wound check - IV pain management until able to take p.o.    Pyuria - asymptomatic, DC IV fluconazole    Hypertension - uncontrolled - persistently elevated BP likely elevated partly due to IV steroids which we are weaning down   - Patient with SBP consistently in upper 160's. Likely secondary to pain  -IV Hydralazine ordered and Metoprolol increased to 12.5 mg q6, increased IV hydralazine in attempt to improve blood pressures -IV Ativan ordered -Continue to monitor   Hyperglycemia - Steroid induced  Secondary to IV Decadron. Continue SSI coverage and CBG monitoring BS coming down as decadron dose coming down     Depression / Adjustment Disorder - Pt has been unable to take her antidepressives for over a week now due to severe dysphagia - plan to restart cymbalta when able to use PEG - we did give her an IV dose of levothyroxine on 11/12, 11/14        Family Communication:   no Family at bedside  Consultants:  IR  Code Status:  DNR  DVT Prophylaxis:  SCDs   Procedures: As Listed in Progress Note Above  Antibiotics: None      Subjective: She complains of neck pain.  Denies f/c, cp, sob, n/v/d, abd pain  Objective: Vitals:   10/29/22 1240 10/29/22 1255 10/29/22 1300 10/29/22 1716  BP: (!) 145/56 (!) 143/58 (!) 153/64 (!) 156/75  Pulse: 78 69 66 (!) 59  Resp: '15 18 17 16  '$ Temp:   98 F (36.7 C)   TempSrc:   Oral   SpO2: 99% 99% 97% 100%  Weight:      Height:        Intake/Output Summary (Last 24 hours) at 10/29/2022 1719 Last data filed at 10/29/2022 0606 Gross per 24 hour  Intake --  Output 200 ml  Net -200 ml   Weight change:  Exam:  General:  Pt is alert, follows commands appropriately, not in acute distress HEENT: No icterus, No thrush, No neck mass,  [place]/AT Cardiovascular: RRR, S1/S2, no rubs, no gallops Respiratory: CTA bilaterally, no wheezing, no crackles, no rhonchi Abdomen: Soft/+BS, non tender, non distended, no guarding Extremities: No edema, No lymphangitis, No petechiae, No rashes, no synovitis   Data Reviewed: I have personally reviewed following labs and imaging studies Basic Metabolic Panel: Recent Labs  Lab 10/23/22 0426 10/24/22 0351 10/25/22 0508 10/26/22 0510 10/28/22 0430 10/29/22 0628  NA 145 147* 146* 139 137 136  K 3.5 3.3* 3.2* 3.8 3.2* 4.0  CL 114* 115* 114* 110 105 107  CO2 18* 19* '24 23 23 22  '$ GLUCOSE 116* 222* 144* 107* 134* 163*  BUN 25* 31* 35* 39* 33* 36*  CREATININE 1.54* 1.47* 1.25* 1.23* 1.29* 1.32*  CALCIUM 8.6* 9.1 9.0 8.7* 8.8* 8.4*  MG 2.1 2.0 2.0  --  1.8 2.4  PHOS  --   --   --   --  2.7 3.0   Liver Function Tests: Recent Labs  Lab 10/23/22 0426 10/24/22 0351 10/25/22 0508 10/28/22 0430 10/29/22 0628  AST 14* '15 25 28 19  '$ ALT '9 10 18 '$ 33 25  ALKPHOS 70 72 75 74 73  BILITOT 1.1 0.8 0.8 0.8 0.6  PROT 6.2* 6.8 6.3* 5.8* 5.8*  ALBUMIN 2.7* 3.3* 3.1* 3.1* 2.9*   No results for input(s): "LIPASE", "AMYLASE" in the last 168 hours. No results for input(s): "AMMONIA" in the last 168 hours. Coagulation Profile: No results for input(s): "INR", "PROTIME" in the last 168 hours. CBC: Recent Labs  Lab 10/23/22 0426 10/24/22 0351 10/25/22 0508  WBC 5.8 11.4* 12.7*  HGB 10.3* 11.1* 10.9*  HCT 32.4* 34.2* 33.6*  MCV 86.2 84.7 85.3  PLT 159 268 211   Cardiac Enzymes: No results for input(s): "CKTOTAL", "CKMB", "CKMBINDEX", "TROPONINI" in the last 168 hours. BNP: Invalid input(s): "POCBNP" CBG: Recent Labs  Lab 10/28/22 0559 10/28/22 1155 10/28/22 1808 10/28/22 2326 10/29/22 0552  GLUCAP 150* 106* 127* 161* 160*   HbA1C: No results for input(s): "HGBA1C" in the last 72 hours. Urine analysis:    Component Value Date/Time   COLORURINE GREEN (A) 10/19/2022 2101    APPEARANCEUR HAZY (A) 10/19/2022 2101   APPEARANCEUR Clear 08/05/2022 1525   LABSPEC 1.014 10/19/2022 2101   PHURINE 5.0 10/19/2022 2101   GLUCOSEU NEGATIVE 10/19/2022 2101   HGBUR MODERATE (A) 10/19/2022 2101   BILIRUBINUR NEGATIVE 10/19/2022 2101   BILIRUBINUR Negative 08/05/2022 [place] 10/19/2022 2101   PROTEINUR 30 (A) 10/19/2022 2101   UROBILINOGEN 0.2 01/16/2015 0848   NITRITE NEGATIVE 10/19/2022 2101   LEUKOCYTESUR LARGE (A) 10/19/2022 2101   Sepsis Labs: '@LABRCNTIP'$ (procalcitonin:4,lacticidven:4) ) Recent Results (from the past 240 hour(s))  Urine Culture     Status: Abnormal   Collection Time: 10/19/22  9:10 PM   Specimen: In/Out Cath Urine  Result Value Ref Range Status   Specimen Description   Final    IN/OUT CATH URINE Performed at [facility], [address] [place], [place] [address]    Special Requests   Final    NONE Performed at [facility], [address], [place], [place] [address]    Culture 6,000 COLONIES/mL YEAST (A)  Final   Report Status 10/21/2022 FINAL  Final  Culture, blood (Routine x 2)     Status: None   Collection Time: 10/19/22  9:30 PM   Specimen: BLOOD LEFT FOREARM  Result Value Ref Range Status   Specimen Description BLOOD LEFT FOREARM  Final   Special Requests   Final    BOTTLES DRAWN AEROBIC AND ANAEROBIC Blood Culture adequate volume   Culture   Final    NO GROWTH 5 DAYS Performed at [facility], [address] [place], [place] [address]    Report Status 10/24/2022 FINAL  Final  Culture, blood (Routine x 2)     Status: None   Collection Time: 10/19/22  9:54 PM   Specimen: BLOOD LEFT HAND  Result Value Ref Range Status   Specimen Description BLOOD LEFT HAND  Final   Special Requests   Final    BOTTLES DRAWN AEROBIC ONLY Blood Culture adequate volume   Culture   Final    NO GROWTH 5 DAYS Performed at [facility], [address], [place],  [address]    Report Status 10/24/2022 FINAL  Final  Resp Panel  by RT-PCR (Flu A&B, Covid) Anterior Nasal Swab     Status: None  Collection Time: 10/19/22 10:14 PM   Specimen: Anterior Nasal Swab  Result Value Ref Range Status   SARS Coronavirus 2 by RT PCR NEGATIVE NEGATIVE Final    Comment: (NOTE) SARS-CoV-2 target nucleic acids are NOT DETECTED.  The SARS-CoV-2 RNA is generally detectable in upper respiratory specimens during the acute phase of infection. The lowest concentration of SARS-CoV-2 viral copies this assay can detect is 138 copies/mL. A negative result does not preclude SARS-Cov-2 infection and should not be used as the sole basis for treatment or other patient management decisions. A negative result may occur with  improper specimen collection/handling, submission of specimen other than nasopharyngeal swab, presence of viral mutation(s) within the areas targeted by this assay, and inadequate number of viral copies(<138 copies/mL). A negative result must be combined with clinical observations, patient history, and epidemiological information. The expected result is Negative.  Fact Sheet for Patients:  EntrepreneurPulse.com.au  Fact Sheet for Healthcare Providers:  IncredibleEmployment.be  This test is no t yet approved or cleared by the [place] FDA and  has been authorized for detection and/or diagnosis of SARS-CoV-2 by FDA under an Emergency Use Authorization (EUA). This EUA will remain  in effect (meaning this test can be used) for the duration of the COVID-19 declaration under Section 564(b)(1) of the Act, 21 U.S.C.section 360bbb-3(b)(1), unless the authorization is terminated  or revoked sooner.       Influenza A by PCR NEGATIVE NEGATIVE Final   Influenza B by PCR NEGATIVE NEGATIVE Final    Comment: (NOTE) The Xpert Xpress SARS-CoV-2/FLU/RSV plus assay is intended as an aid in the diagnosis of influenza from Nasopharyngeal swab specimens and should not be used as a sole basis  for treatment. Nasal washings and aspirates are unacceptable for Xpert Xpress SARS-CoV-2/FLU/RSV testing.  Fact Sheet for Patients: EntrepreneurPulse.com.au  Fact Sheet for Healthcare Providers: IncredibleEmployment.be  This test is not yet approved or cleared by the [place] FDA and has been authorized for detection and/or diagnosis of SARS-CoV-2 by FDA under an Emergency Use Authorization (EUA). This EUA will remain in effect (meaning this test can be used) for the duration of the COVID-19 declaration under Section 564(b)(1) of the Act, 21 U.S.C. section 360bbb-3(b)(1), unless the authorization is terminated or revoked.  Performed at [facility], [address] [place], [place] [address]      Scheduled Meds:  alteplase  2 mg Intracatheter Once   Chlorhexidine Gluconate Cloth  6 each Topical Daily   dexamethasone (DECADRON) injection  2 mg Intravenous Q12H   DULoxetine  60 mg Oral q morning   gabapentin  100 mg Oral BID   gabapentin  200 mg Oral QHS   hydrALAZINE  15 mg Intravenous Q6H   insulin aspart  0-9 Units Subcutaneous Q6H   levothyroxine  75 mcg Oral QAC breakfast   metoprolol tartrate  12.5 mg Intravenous Q6H   mirabegron ER  50 mg Oral Daily   pantoprazole (PROTONIX) IV  40 mg Intravenous Q24H   sodium chloride flush  10-40 mL Intracatheter Q12H   Continuous Infusions:  sodium chloride Stopped (10/25/22 0532)   TPN ADULT (ION) 40 mL/hr at 10/28/22 1859   TPN ADULT (ION)      Procedures/Studies: IR GASTROSTOMY TUBE MOD SED  Result Date: 10/29/2022 INDICATION: Recent neck surgery with persistent postoperative dysphagia. Please perform percutaneous gastrostomy tube placement for enteric nutrition supplementation purposes. EXAM: PULL TROUGH GASTROSTOMY TUBE PLACEMENT COMPARISON:  CT abdomen and pelvis-10/14/2022 MEDICATIONS: Ancef 2 gm IV; Antibiotics were  administered within 1 hour of the procedure. Glucagon 1 mg IV  CONTRAST:  20 cc Omnipaque 300 administered into the gastric lumen. ANESTHESIA/SEDATION: Moderate (conscious) sedation was employed during this procedure. A total of Versed 0.5 mg and Fentanyl 25 mcg was administered intravenously. Moderate Sedation Time: 12 minutes. The patient's level of consciousness and vital signs were monitored continuously by radiology nursing throughout the procedure under my direct supervision. FLUOROSCOPY TIME:  1 minute, 24 seconds (8 mGy) COMPLICATIONS: None immediate. PROCEDURE: Informed written consent was obtained from the patient following explanation of the procedure, risks, benefits and alternatives. A time out was performed prior to the initiation of the procedure. Ultrasound scanning was performed to demarcate the edge of the left lobe of the liver. Maximal barrier sterile technique utilized including caps, mask, sterile gowns, sterile gloves, large sterile drape, hand hygiene and Betadine prep. The left upper quadrant was sterilely prepped and draped. An oral gastric catheter was inserted into the stomach under fluoroscopy. The existing nasogastric feeding tube was removed. The left costal margin and air opacified transverse colon were identified and avoided. Air was injected into the stomach for insufflation and visualization under fluoroscopy. Under sterile conditions a 17 gauge trocar needle was utilized to access the stomach percutaneously beneath the left subcostal margin after the overlying soft tissues were anesthetized with 1% Lidocaine with epinephrine. Needle position was confirmed within the stomach with aspiration of air and injection of small amount of contrast. A single T tack was deployed for gastropexy. Over an Amplatz guide wire, a 9-French sheath was inserted into the stomach. A snare device was utilized to capture the oral gastric catheter. The snare device was pulled retrograde from the stomach up the esophagus and out the oropharynx. The 20-French  pull-through gastrostomy was connected to the snare device and pulled antegrade through the oropharynx down the esophagus into the stomach and then through the percutaneous tract external to the patient. The gastrostomy was assembled externally. Contrast injection confirms appropriate positioning within the stomach. Several spot radiographic images were obtained in various obliquities for documentation. Dressings were applied. The patient tolerated procedure well without immediate post procedural complication. FINDINGS: After successful fluoroscopic guided placement, the gastrostomy tube is appropriately positioned with internal disc positioned against the inner ventral wall of the gastric lumen. IMPRESSION: Successful fluoroscopic insertion of a 20-French pull-through gastrostomy tube. The gastrostomy may be used immediately for medication administration and in 24 hrs for the initiation of feeds. Electronically Signed   By: Sandi Mariscal M.D.   On: 10/29/2022 15:28   DG Naso G Tube Plc W/Fl W/Rad  Result Date: 10/22/2022 CLINICAL DATA:  Feeding tube placement EXAM: NASO G TUBE PLACEMENT WITH FL AND WITH RAD CONTRAST:  None utilized FLUOROSCOPY: Fluoroscopy Time:  1 minute 6 seconds Radiation Exposure Index (if provided by the fluoroscopic device): 17.00 mGy Number of Acquired Spot Images: 1 COMPARISON:  None Available. FINDINGS: Attempted placement of feeding tube. Immediate epistaxis with introduction of the tube, potentially from attempts to place the tube yesterday; bleeding controlled by nasal pressure. Multiple times the feeding tube was placed into the trachea with a spontaneous cough reflex. Tube was removed. Despite repeated swallowing, the esophagus could not be intubated. Patient unable to flex chin to chest to aid in placement due to recent cervical spine surgery and presence of soft cervical collar. IMPRESSION: Unsuccessful attempts to place feeding tube. Procedure and epistaxis discussed with Dr.  Wynetta Emery at conclusion of procedure. Electronically Signed   By: Lavonia Dana  M.D.   On: 10/22/2022 12:02   DG Swallowing Func-Speech Pathology  Result Date: 10/21/2022 Table formatting from the original result was not included. MBSS at [facility], inpatient:  10/21/22 1200 SLP Visit Information SLP Received On 10/21/22 General Information HPI TATA TIMMINS is a 88 y.o. 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  Patient was lying in bed with neck brace and most of the history was obtained from ED physician and niece at the bedside.  Per report, patient presented with cough and congestion a few days ago, her physician was contacted and recommended giving Mucinex, patient's aide called the niece this afternoon that patient was vomiting, niece activated EMS and on arrival of EMS team, she was noted to be hypoxic with an O2 sat of 84% on room air, NRB at 15 L was provided with improvement in oxygenation to 98%.  Patient was tachycardic at rate of 136, she was febrile with a temperature of 102F (orally).  Code sepsis was activated en route to the ED.;CXR indicated on 10/19/22 potential Aspiration PNA or B PNA.BSEcompleted this AM and MBSS recommended. Caregiver present No Diet Prior to this Study NPO Temperature  Normal Respiratory Status WFL Supplemental O2 Nasal cannula History of Recent Intubation Yes Length of Intubations (days) 1 days Date extubated 10/17/22 Number of Intubations 1 Self extubation  No Behavior/Cognition Alert;Cooperative Self-Feeding Abilities Able to self-feed Baseline vocal quality/speech Normal Volitional Cough Able to elicit Volitional Cough Assessment Appears WFL Volitional Swallow Able to elicit Anatomy Presence of cervical  hardware (posterior pharyngeal wall swelling s/p ACDF surgery on Friday) Orofacial Exam Oral Cavity Assessment WFL Oral Cavity - Dentition Adequate natural dentition Orofacial Anatomy WFL Oral Motor/Sensory Function WFL Thin Liquids (Level 0) Thin Liquids  Impaired Bolus delivery method Spoon Thin Liquid - Impairment Pharyngeal impairment Initiation of swallow  Valleculae Soft palate elevation Complete Tongue base retraction Wide column of contrast or air between tongue base and PPW Laryngeal elevation Complete superior movement of thyroid cartilage with complete approximation of arytenoids to epiglottic petiole Anterior hyoid excursion Complete Epiglottic movement Partial Laryngeal vestibule closure Incomplete Pharyngeal stripping wave  Present - diminished Pharyngeal contraction (A/P view only) N/A Pharyngoesophageal segment opening Partial distention/partial duration, partial obstruction of flow Pharyngeal residue Collection of residue within or on pharyngeal structures Location of pharyngeal residue Valleculae;Pharyngeal wall;Pyriform sinuses Penetration/Aspiration Scale (PAS) score 7.  Material enters airway, passes BELOW cords and not ejected out despite cough attempt by patient Mildly thick liquids (Level 2, nectar thick) Mildly thick liquids (Level 2, nectar thick) Impaired Bolus delivery method Spoon Mildly Thick Liquid - Impairment Pharyngeal impairment Initiation of swallow  Valleculae Soft palate elevation Complete Tongue base retraction Wide column of contrast or air between tongue base and PPW Laryngeal elevation Complete superior movement of thyroid cartilage with complete approximation of arytenoids to epiglottic petiole Anterior hyoid excursion Complete Epiglottic movement Partial Laryngeal vestibule closure Incomplete Pharyngeal stripping wave  Present - diminished Pharyngeal contraction (A/P view only) N/A Pharyngoesophageal segment opening Partial distention/partial duration, partial obstruction  of flow Pharyngeal residue Collection of residue within or on pharyngeal structures Location of pharyngeal residue Valleculae;Pharyngeal wall;Pyriform sinuses Penetration/Aspiration Scale (PAS) score 7.  Material enters airway, passes BELOW cords and not ejected out despite cough attempt by patient Moderately thick liquids (Level 3,  honey thick) Moderately thick liquids (Level 3, honey thick) Impaired Moderately Thick Liquid - Impairment Pharyngeal impairment Initiation of swallow  Valleculae Soft palate elevation Complete Tongue base retraction Wide column of contrast or air between tongue base and PPW Laryngeal elevation Complete superior movement of thyroid cartilage with complete approximation of arytenoids to epiglottic petiole Anterior hyoid excursion Complete Epiglottic movement Partial Laryngeal vestibule closure Incomplete Pharyngeal stripping wave  Present - diminished Pharyngeal contraction (A/P view only) N/A Pharyngoesophageal segment opening Partial distention/partial duration, partial obstruction of flow Pharyngeal residue Collection of residue within or on pharyngeal structures Location of pharyngeal residue Valleculae;Pharyngeal wall;Pyriform sinuses Penetration/Aspiration Scale (PAS) score 7.  Material enters airway, passes BELOW cords and not ejected out despite cough attempt by patient Puree Puree Impaired Puree - Impairment Pharyngeal impairment Initiation of swallow Valleculae Soft palate elevation Complete Tongue base retraction Wide column of contrast or air between tongue base and PPW Laryngeal elevation Complete superior movement of thyroid cartilage with complete approximation of arytenoids to epiglottic petiole Anterior hyoid excursion Complete Epiglottic movement Partial Laryngeal vestibule closure Incomplete Pharyngeal stripping wave  Present - diminished Pharyngeal contraction (A/P view only) N/A Pharyngoesophageal segment opening Partial distention/partial duration, partial  obstruction of flow Pharyngeal residue Collection of residue within or on pharyngeal structures;Majority of contrast within or on pharyngeal structures Location of pharyngeal residue Valleculae;Pharyngeal wall;Pyriform sinuses Penetration/Aspiration Scale (PAS) score 7.  Material enters airway, passes BELOW cords and not ejected out despite cough attempt by patient Solid Solid Not Tested Pill Pill Not Tested Compensatory Strategies Compensatory strategies Yes Effortful swallow Ineffective Multiple swallows Effective Effective Multiple Swallows Thin liquid (Level 0);Mildly thick liquid (Level 2, nectar thick);Moderately thick liquid (Level 3, honey thick);Puree Chin tuck Ineffective Left head turn Ineffective Right head turn Ineffective Clinical Impression Clinical Impression Pt presents with moderate/severe pharyngeal phase dysphagia characterized by vallecular trigger with tsp thin, NTL, HTL, and puree, posterior pharyngeal wall edema negatively impacting epiglottic deflection and pharyngeal constriction resulting in penetration during the swallow, aspiration after the swallow, and pharyngeal residue. Pt with episodes of gross aspiration after the swallow from the residuals which was sensed and mostly removed, but then unable to completely clear pharynx. Head turns left and right were attempted, but found to be ineffective. Pt with limited head movement due to soft neck brace and discomfort for chin down. Recommend NPO with alternative means of nutrition via NG in the hopes that swelling will decrease over the next 10-14 days. Pt has a very strong cough, however she is unable to completely clear all aspirates and then unable to completely clear pharynx at this time. Consider consultation with Dr. Trenton Gammon, who did the surgery to see if steroids could be beneficial during this stage of her recovery. SLP to follow and can administer single ice chips for comfort and to help prevent muscle disuse atrophy (SLP for puree  trials to assess for readiness for repeat instrumental assessment). RN to complete oral care and have suction at bedside. Pt was shown how to self use the [place] suction and encouraged to cough and clear. SLP will follow.  SLP Visit Diagnosis Dysphagia, pharyngeal phase (R13.13);Dysphagia, pharyngoesophageal phase (R13.14) Factors that may increase risk of adverse event in presence of aspiration ([place] 2021) Limited mobility;Frail or deconditioned;Frequent aspiration of large volumes Swallowing Evaluation Recommendations Recommendations NPO;Alternative means of nutrition - NG Tube Medication Administration Via alternative means Swallowing strategies   (trials puree with SLP only) Postural changes Position pt fully upright for meals;Stay upright 30-60 min  after meals Oral care recommendations Oral care QID (4x/day);Staff/trained caregiver to provide oral care Recommended consults Consider dietitian consultation Treatment Plan Treatment recommendations Therapy as outlined in treatment plan below Follow-up recommendations Skilled nursing-short term rehab (<3 hours/day) Assistance recommended at discharge Frequent or constant supervision/assistance Functional status assessment Patient has had a recent decline in their functional status and demonstrates the ability to make significant improvements in function in a reasonable and predictable amount of time. Treatment frequency Min 2x/week Treatment duration 1 week Interventions Aspiration precaution training;Trials of upgraded texture/liquids;Patient/family education Goal Planning Prognosis for Safe Diet Advancement Fair Barriers to Reach Goals Severity of deficits Patient/Family Stated Goal Swallow safely Consulted and agree with results and recommendations Patient;Physician SLP Time Calculation SLP Start Time (ACUTE ONLY) 1151 SLP Stop Time (ACUTE ONLY) 1223 SLP Time Calculation (min) (ACUTE ONLY) 32 min SLP Evaluations $ SLP Speech Visit 1 Visit SLP  Evaluations $MBS Swallow 1 Procedure Thank you, Genene Churn, Arist[place]   [facility] Chest Port 1V same Day  Result Date: 10/21/2022 CLINICAL DATA:  Follow-up malpositioned NG tube. EXAM: PORTABLE CHEST 1 VIEW COMPARISON:  Earlier chest x-ray, same day. FINDINGS: The NG tube has been removed. No evidence of a right-sided pneumothorax. The left PICC line is stable. Persistent bibasilar atelectasis or infiltrates and small left pleural effusion. IMPRESSION: 1. Removal of malpositioned NG tube.  No right-sided pneumothorax. 2. Persistent bibasilar atelectasis or infiltrates and small left pleural effusion. Electronically Signed   By: Marijo Sanes M.D.   On: 10/21/2022 14:33   DG CHEST PORT 1 VIEW  Addendum Date: 10/21/2022   ADDENDUM REPORT: 10/21/2022 14:09 ADDENDUM: The original report was by Dr. Van Clines. The following addendum is by Dr. Van Clines: Critical Value/emergent results were called by telephone at the time of interpretation on 10/21/2022 at 2:05 pm to provider [facility] , who verbally acknowledged these results. Electronically Signed   By: Van Clines M.D.   On: 10/21/2022 14:09   Result Date: 10/21/2022 CLINICAL DATA:  Nasogastric tube placement. EXAM: PORTABLE CHEST 1 VIEW COMPARISON:  10/19/2022 FINDINGS: The nasogastric tube tip projects over the right lower lobe medially, and is fairly peripheral in the right lung. This tube is malposition. There is some continued airspace opacities in both lung bases along with blunting of the left costophrenic angle. Borderline enlargement of the cardiopericardial silhouette. Atherosclerotic calcification of the aortic arch. No current pneumothorax. Dextroconvex thoracolumbar scoliosis. Left upper quadrant density likely from contrast medium in the stomach. Left-sided PICC line tip: Cavoatrial junction. IMPRESSION: 1. Malpositioned nasogastric tube with the tip projecting peripherally over the right lower lobe medially. No  currently visible pneumothorax; removal recommended with immediate post removal chest radiograph to rule out post removal pneumothorax prior to attempts at replacement. Close clinical observation of the patient is recommended following removal of the tube until chest radiography can confirm the lack of a post-removal pneumothorax. 2. Continued airspace opacities in both lung bases with blunting of the left costophrenic angle. 3. Borderline enlargement of the cardiopericardial silhouette. 4. Left upper quadrant density likely from contrast medium in the stomach. 5. Dextroconvex thoracolumbar scoliosis. Radiology assistant personnel have been notified to put me in telephone contact with the referring physician or the referring physician's clinical representative in order to discuss these findings. Once this communication is established I will issue an addendum to this report for documentation purposes. Electronically Signed: By: Van Clines M.D. On: 10/21/2022 14:03   [name] EKG SITE RITE  Result Date: 10/20/2022 If Site  Rite image not attached, placement could not be confirmed due to current cardiac rhythm.  DG Chest Port 1 View  Result Date: 10/19/2022 CLINICAL DATA:  Questionable sepsis. EXAM: PORTABLE CHEST 1 VIEW COMPARISON:  PA Lat 05/16/2022 FINDINGS: There is mild cardiomegaly without evidence of CHF. Mild aortic tortuosity and calcification with stable mediastinum. Since the prior study there has developed patchy airspace disease in both lung bases consistent with bilateral pneumonia or aspiration. Minimal pleural effusions have also developed. The mid and upper lung fields are clear. There are degenerative changes of the thoracic spine and old right axillary surgical clips. IMPRESSION: 1. New patchy airspace disease in both lung bases consistent with bilateral pneumonia or aspiration. Minimal pleural effusions. Follow-up study recommended after treatment to ensure clearing. 2. Mild cardiomegaly  without evidence of CHF. 3. Aortic atherosclerosis. Electronically Signed   By: Telford Nab M.D.   On: 10/19/2022 21:35   DG Cervical Spine 2 or 3 views  Result Date: 10/17/2022 CLINICAL DATA:  Fluoroscopic assistance for cervical spinal fusion EXAM: CERVICAL SPINE - 2-3 VIEW COMPARISON:  06/01/2022 FINDINGS: Fluoroscopic images show anterior surgical fusion from C3-C5 levels. Fluoroscopy time 7.3 seconds. Radiation dose 0.61 mGy. IMPRESSION: Fluoroscopic assistance was provided for anterior surgical fusion at C3-C4 and C4-C5 levels. Electronically Signed   By: Elmer Picker M.D.   On: 10/17/2022 18:38   DG C-Arm 1-60 Min-No Report  Result Date: 10/17/2022 Fluoroscopy was utilized by the requesting physician.  No radiographic interpretation.   DG C-Arm 1-60 Min-No Report  Result Date: 10/17/2022 Fluoroscopy was utilized by the requesting physician.  No radiographic interpretation.   CT ABDOMEN PELVIS WO CONTRAST  Result Date: 10/16/2022 CLINICAL DATA:  88 year old female presents with history of bladder cancer for follow-up. * Tracking Code: BO * EXAM: CT ABDOMEN AND PELVIS WITHOUT CONTRAST TECHNIQUE: Multidetector CT imaging of the abdomen and pelvis was performed following the standard protocol without IV contrast. RADIATION DOSE REDUCTION: This exam was performed according to the departmental dose-optimization program which includes automated exposure control, adjustment of the mA and/or kV according to patient size and/or use of iterative reconstruction technique. COMPARISON:  January 24, 2022. FINDINGS: Lower chest: Basilar atelectasis. No effusion. No consolidative changes. Hepatobiliary: Smooth hepatic contours. Post cholecystectomy. No gross biliary duct distension. No visible lesion on noncontrast imaging. Pancreas: Limited assessment due to lack of intravenous contrast. Fatty atrophy favoring the head of the pancreas. Spleen: Normal. Adrenals/Urinary Tract: Adrenal glands are  normal. There is new fullness of LEFT ureter and intrarenal collecting systems since prior imaging with mild LEFT hydronephrosis. No perinephric stranding. No gross calculus in the distal LEFT ureter. Transition does occur at the LEFT bladder base however. This areas obscured by streak artifact from hip arthroplasty changes. Large staghorn calculus in the interpolar and lower pole or RIGHT intrarenal collecting system similar to prior imaging. Enlarging intermediate density lesion arises from the lower pole the RIGHT kidney measuring 59 Hounsfield units at 1.5 cm previously 1.2 cm in May of 2023. No gross stranding adjacent urinary bladder. Stomach/Bowel: No acute gastrointestinal findings. Signs of prior bowel resection with ileocolonic anastomosis. Dilute contrast passes into the colon with patulous appearance of the proximal colon. Vascular/Lymphatic: Aortic atherosclerosis. No sign of aneurysm. Smooth contour of the IVC. There is no gastrohepatic or hepatoduodenal ligament lymphadenopathy. No retroperitoneal or mesenteric lymphadenopathy. No pelvic sidewall lymphadenopathy. Limited assessment of vascular structures due to lack of intravenous contrast. Reproductive: Cystic RIGHT adnexal lesion stable 4 cm greatest axial dimension only minimally  changed since imaging from 2017. Other: No ascites.  No pneumoperitoneum. Musculoskeletal: RIGHT hip arthroplasty, incompletely imaged. Osteopenia. Spinal degenerative changes. IMPRESSION: 1. New mild LEFT hydroureteronephrosis in this patient with history of prior bladder neoplasm. Stricture related to therapy or lesion are considered as causes in this patient with history of cancer near the trigone. Cystoscopic assessment may be warranted if not recently performed. 2. Large staghorn calculus of the RIGHT kidney. 3. RIGHT renal lesion with intermediate density more likely hemorrhagic cyst based on simple appearance seen on prior imaging studies. Consider follow-up renal  sonogram. 4. Cystic RIGHT adnexal lesion stable 4 cm greatest axial dimension only minimally changed since imaging from 2017. Recommend follow-up US in 6-12 months. Note: This recommendation does not apply to premenarchal patients and to those with increased risk (genetic, family history, elevated tumor markers or other high-risk factors) of ovarian cancer. Reference: JACR 2020 Feb; 17(2):248-254 5. Aortic atherosclerosis. Aortic Atherosclerosis (ICD10-I70.0). Electronically Signed   By: Zetta Bills M.D.   On: 10/16/2022 14:05    Orson Eva, DO  [facility]  If 7PM-7AM, please contact night-coverage [email or web address] Password [email or web address] 10/29/2022, 5:19 PM   LOS: 10 days

## 2022-10-29 NOTE — Progress Notes (Signed)
Physical Therapy Treatment Patient Details Name: Jane Andrews MRN: [id number] DOB: Nov 20, 1925 Today's Date: 10/29/2022   History of Present Illness Jane Andrews is a 88 y.o. 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  Patient was laying in bed with neck brace and most of the history was obtained from ED physician and niece at the bedside.  Per report, patient presented with cough and congestion a few days ago, her physician was contacted and recommended giving Mucinex, patient's aide called the niece this afternoon that patient was vomiting, niece activated EMS and on arrival of EMS team, she was noted to be hypoxic with an O2 sat of 84% on room air, NRB at 15 L was provided with improvement in oxygenation to 98%.  Patient was tachycardic at rate of 136, she was febrile with a temperature of 102F (orally).  Code sepsis was activated en route to the ED.    PT Comments    Patient presents alert and agreeable for therapy.  Patient demonstrates slow labored movement for rolling to side and sitting up from side lying position requiring HOB partially raised and use of bed rail, fair/good return for completing BLE ROM/strengthening exercises while seated at bedside having to hold onto bed rail to maintain sitting balance and limited to a few slow labored unsteady steps at bedside before having to sit due to fatigue.  Patient will benefit from continued skilled physical therapy in hospital and recommended venue below to increase strength, balance, endurance for safe ADLs and gait.      Recommendations for follow up therapy are one component of a multi-disciplinary discharge planning process, led by the attending physician.  Recommendations may be  updated based on patient status, additional functional criteria and insurance authorization.  Follow Up Recommendations  Skilled nursing-short term rehab (<3 hours/day) Can patient physically be transported by private vehicle: Yes   Assistance Recommended at Discharge Intermittent Supervision/Assistance  Patient can return home with the following A lot of help with walking and/or transfers;Assist for transportation;Direct supervision/assist for medications management;A lot of help with bathing/dressing/bathroom   Equipment Recommendations  None recommended by PT    Recommendations for Other Services       Precautions / Restrictions Precautions Precautions: Fall Required Braces or Orthoses: Cervical Brace Cervical Brace: Soft collar;At all times Restrictions Weight Bearing Restrictions: No     Mobility  Bed Mobility Overal bed mobility: Needs Assistance Bed Mobility: Rolling, Sidelying to Sit Rolling: Min assist, Mod assist Sidelying to sit: Mod assist, HOB elevated       General bed mobility comments: patient required use of bed rail and HOB slightly rasied for completing side lying to sitting with slow labored movement    Transfers Overall transfer level: Needs assistance Equipment used: Rolling walker (2 wheels) Transfers: Sit to/from Stand, Bed to chair/wheelchair/BSC Sit to Stand: Mod assist   Step pivot transfers: Mod assist       General transfer comment: diffiuclty completing sit to stands due to BLE weakness    Ambulation/Gait Ambulation/Gait assistance: Mod assist Gait Distance (Feet): 6 Feet Assistive device: Rolling walker (2 wheels) Gait Pattern/deviations: Decreased step length - right, Decreased step length - left, Decreased stride length, Staggering right, Staggering left Gait velocity: slow     General Gait Details: limited to a fewl slow  labored side steps before having to sit due to BLE weakness and poor standing balance   Stairs              Wheelchair Mobility    Modified Rankin (Stroke Patients Only)       Balance Overall balance assessment: Needs assistance Sitting-balance support: Feet supported, No upper extremity supported, Single extremity supported Sitting balance-Leahy Scale: Fair Sitting balance - Comments: fair/good holding onto bed rail while seated at EOB   Standing balance support: Reliant on assistive device for balance, During functional activity, Bilateral upper extremity supported Standing balance-Leahy Scale: Poor Standing balance comment: fair/poor using RW                            Cognition Arousal/Alertness: Awake/alert Behavior During Therapy: WFL for tasks assessed/performed Overall Cognitive Status: Within Functional Limits for tasks assessed                                          Exercises General Exercises - Lower Extremity Long Arc Quad: Seated, AROM, Strengthening, Both, 15 reps Hip Flexion/Marching: Seated, AROM, Strengthening, Both, 15 reps Toe Raises: Seated, AROM, Strengthening, Both, 15 reps Heel Raises: Seated, AROM, Strengthening, Both, 15 reps    General Comments        Pertinent Vitals/Pain Pain Assessment Pain Assessment: No/denies pain    Home Living                          Prior Function            PT Goals (current goals can now be found in the care plan section) Acute Rehab PT Goals Patient Stated Goal: return home PT Goal Formulation: With patient/family Time For Goal Achievement: 11/04/22 Potential to Achieve Goals: Good Progress towards PT goals: Progressing toward goals    Frequency    Min 3X/week      PT Plan Current plan remains appropriate    Co-evaluation              AM-PAC PT "6 Clicks" Mobility   Outcome Measure  Help needed turning from your back to your side while in a flat bed without using bedrails?: A Lot Help needed moving from lying on your back to sitting on the  side of a flat bed without using bedrails?: A Lot Help needed moving to and from a bed to a chair (including a wheelchair)?: A Lot Help needed standing up from a chair using your arms (e.g., wheelchair or bedside chair)?: A Lot Help needed to walk in hospital room?: A Lot Help needed climbing 3-5 steps with a railing? : Total 6 Click Score: 11    End of Session Equipment Utilized During Treatment: Cervical collar Activity Tolerance: Patient tolerated treatment well;Patient limited by fatigue Patient left: in chair;with call bell/phone within reach;with chair alarm set;with family/visitor present Nurse Communication: Mobility status PT Visit Diagnosis: Other abnormalities of gait and mobility (R26.89);Muscle weakness (generalized) (M62.81);Other symptoms and signs involving the nervous system (R29.898)     Time: 3419-3790 PT Time Calculation (min) (ACUTE ONLY): 30 min  Charges:  $Therapeutic Exercise: 8-22 mins $Therapeutic Activity: 8-22 mins                     2:57 PM, 10/29/22 Lonell Grandchild, MPT Physical Therapist with  [facility] [address] [phone] office [phone] mobile phone

## 2022-10-29 NOTE — Procedures (Signed)
Pre procedure Dx: Dysphagia Post Procedure Dx: Same  Successful fluoroscopic guided insertion of gastrostomy tube.   The gastrostomy tube may be used immediately for medications.   Tube feeds may be initiated in 24 hours as per the primary team.    EBL: Trace  Complications: None immediate  Jay Fidencia Mccloud, MD Pager #: [phone]     

## 2022-10-29 NOTE — Consult Note (Signed)
Chief Complaint: Patient was seen in consultation today for percutaneous gastric tube placement Chief Complaint  Patient presents with   Shortness of Breath   at the request of Dr Murvin Natal   Supervising Physician: Sandi Mariscal  Patient Status: [facility] IP  History of Present Illness: Jane Andrews is a 88 y.o. 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 s/p  C3-4, C4-5 anterior cervical discectomy and fusion (10/17/2022   In [place]; vomiting at home per niece--- to ED at [facility] Hypoxic and septic Aspiration PNA Dysphagia Failed swallow 11/13   Request for percutaneous gastric tube placement Dr Pascal Lux has reviewed imaging and approves procedure   Past Medical History:  Diagnosis Date   Abdominal adhesions 1994   Allergic rhinitis    Anemia    Anxiety and depression    Aortic stenosis    Arthritis    Atrial fibrillation ([place]) 10/2012   Associated with severe anemia and esophageal pill impaction   Breast carcinoma ([place])    Right mastectomy   Cholelithiasis    Essential hypertension    Gastroesophageal reflux disease    Hiatal hernia   History of blood transfusion    History of kidney stones    Hypothyroidism    Low back pain    Malabsorption    Short gut syndrome following small bowel resection surgery x2   Nephrolithiasis 2004   Painless hematuria   Short gut syndrome    Bowel resection , 2004   Upper GI bleed 2004   Multiple episodes of melena-? due to gastritis or adverse drug effect (nonsteroidals, small bowel ulceration with Fosamax); caused by Pepto-Bismol during one Emergency Department evaluation    Past Surgical History:  Procedure Laterality Date   ABDOMINAL HYSTERECTOMY     emergency s/p delivery   ABDOMINAL HYSTERECTOMY  1960   massive gynecologic bleeding   ANTERIOR CERVICAL DECOMP/DISCECTOMY FUSION N/A 10/17/2022   Procedure: Anterior Cervical Discectomy and Fusion Cervical Three-Four/Cervical  Four-Five;  Surgeon: Earnie Larsson, MD;  Location: [facility];  Service: Neurosurgery;  Laterality: N/A;  3C   BOWEL RESECTION     Resulting short gut syndrome   CHOLECYSTECTOMY N/A 06/25/2018   Procedure: LAPAROSCOPIC CHOLECYSTECTOMY WITH INTRAOPERATIVE CHOLANGIOGRAM;  Surgeon: Armandina Gemma, MD;  Location: [facility];  Service: General;  Laterality: N/A;   COLONOSCOPY W/ POLYPECTOMY  2005   Lipoma; diverticulosis   COLONOSCOPY WITH ESOPHAGOGASTRODUODENOSCOPY (EGD)  11/22/2012   [facility]   CYSTOSCOPY W/ RETROGRADES Bilateral 04/16/2022   Procedure: CYSTOSCOPY;  Surgeon: Primus Bravo., MD;  Location: [facility];  Service: Urology;  Laterality: Bilateral;   ESOPHAGOGASTRODUODENOSCOPY (EGD) WITH PROPOFOL N/A 04/04/2022   Procedure: ESOPHAGOGASTRODUODENOSCOPY (EGD) WITH PROPOFOL;  Surgeon: Rogene Houston, MD;  [facility];  Service: Endoscopy;  Laterality: N/A;  210   HIP ARTHROPLASTY Right 06/15/2020   Procedure: ANTERIOR ARTHROPLASTY BIPOLAR HIP (HEMIARTHROPLASTY);  Surgeon: Mcarthur Rossetti, MD;  Location: [facility];  Service: Orthopedics;  Laterality: Right;   LAPAROSCOPIC LYSIS OF ADHESIONS  1965   s/p adhesions   MASTECTOMY  right breast   MASTECTOMY     Carcinoma of the breast; right   TRANSURETHRAL RESECTION OF BLADDER TUMOR N/A 04/16/2022   Procedure: TRANSURETHRAL RESECTION OF BLADDER TUMOR (TURBT);  Surgeon: Primus Bravo., MD;  Location: [facility];  Service: Urology;  Laterality: N/A;   UPPER GASTROINTESTINAL ENDOSCOPY      Allergies: Codeine  Medications: Prior to Admission medications  Medication Sig Start Date End Date Taking? Authorizing Provider  acetaminophen (TYLENOL) 500 MG tablet Take 1 tablet (500 mg total) by mouth 4 (four) times daily -  with meals and at bedtime. Patient taking differently: Take 500 mg by mouth every 6 (six) hours as needed for moderate pain. 06/06/22  Yes Love, Ivan Anchors, PA-C  ALPRAZolam Duanne Moron) 0.5 MG tablet Take 1 mg by mouth at bedtime.  12/19/21  Yes [provider]  DULoxetine (CYMBALTA) 30 MG capsule Take 30 mg by mouth every morning. Take with a 60 mg capsule for a total of 90 mg daily   Yes [provider]  DULoxetine (CYMBALTA) 60 MG capsule Take 60 mg by mouth every morning. T 03/30/22  Yes [provider]  fosfomycin (MONUROL) 3 g PACK 3 g by mouth every 10 days 09/09/22  Yes Stoneking, Reece Leader., MD  gabapentin (NEURONTIN) 100 MG capsule Take 2 capsules (200 mg total) by mouth 3 (three) times daily. Patient taking differently: Take 100-200 mg by mouth See admin instructions. Take 100 mg by mouth in the morning and afternoon and take 200 mg in the evening 07/10/22  Yes Jennye Boroughs, MD  HYDROcodone-acetaminophen (NORCO/VICODIN) 5-325 MG tablet Take 1 tablet by mouth every 4 (four) hours as needed for moderate pain ((score 4 to 6)). 10/18/22  Yes Meyran, Ocie Cornfield, NP  levothyroxine (SYNTHROID) 75 MCG tablet Take 1 tablet (75 mcg total) by mouth daily before breakfast. 07/09/20  Yes Gerlene Fee, NP  loperamide (IMODIUM) 2 MG capsule TAKE ONE CAPSULE ('2MG'$  TOTAL) BY MOUTH FOUR TIMES DAILY AS NEEDED FOR DIARRHEA OR LOOSE STOOLS Patient taking differently: Take 2 mg by mouth 4 (four) times daily as needed for diarrhea or loose stools. 09/17/22  Yes Stoneking, Reece Leader., MD  Meth-Hyo-M Barnett Hatter Phos-Ph Sal (URIBEL) 118 MG CAPS Take 1 capsule (118 mg total) by mouth 4 (four) times daily as needed (pain with urination). 04/23/22  Yes Stoneking, Reece Leader., MD  methocarbamol (ROBAXIN) 750 MG tablet Take 1 tablet (750 mg total) by mouth 4 (four) times daily. Patient taking differently: Take 750 mg by mouth every 6 (six) hours as needed for muscle spasms. 10/18/22  Yes Meyran, Ocie Cornfield, NP  metoprolol succinate (TOPROL-XL) 25 MG 24 hr tablet Take 1 tablet (25 mg total) by mouth every morning. 06/06/22  Yes Love, Ivan Anchors, PA-C  mirabegron ER (MYRBETRIQ) 50 MG TB24 tablet Take 1 tablet (50 mg total) by  mouth daily. 08/05/22  Yes Stoneking, Reece Leader., MD  Multiple Vitamins-Iron (MULTIVITAMINS WITH IRON) TABS tablet Take 1 tablet by mouth every morning. 06/19/20  Yes [provider]  sodium bicarbonate 650 MG tablet Take 650 mg by mouth 2 (two) times daily. 09/20/20  Yes [provider]     Family History  Problem Relation Age of Onset   Anuerysm Father    Rheum arthritis Sister    Healthy Sister    COPD Sister    Healthy Brother    Cancer Other    Colon cancer Neg Hx     Social History   Socioeconomic History   Marital status: Widowed    Spouse name: Not on file   Number of children: Not on file   Years of education: 12   Highest education level: Not on file  Occupational History   Not on file  Tobacco Use   Smoking status: Former    Packs/day: 1.50    Years: 20.00    Total pack years:  30.00    Types: Cigarettes    Passive exposure: Never   Smokeless tobacco: Never  Vaping Use   Vaping Use: Never used  Substance and Sexual Activity   Alcohol use: No   Drug use: No   Sexual activity: Not Currently    Birth control/protection: Post-menopausal, Surgical    Comment: hyst  Other Topics Concern   Not on file  Social History Narrative   Not on file   Social Determinants of Health   Financial Resource Strain: Not on file  Food Insecurity: No Food Insecurity (10/20/2022)   Hunger Vital Sign    Worried About Running Out of Food in the Last Year: Never true    Ran Out of Food in the Last Year: Never true  Transportation Needs: No Transportation Needs (10/20/2022)   PRAPARE - Hydrologist (Medical): No    Lack of Transportation (Non-Medical): No  Physical Activity: Not on file  Stress: Not on file  Social Connections: Not on file    Review of Systems: A 12 point ROS discussed and pertinent positives are indicated in the HPI above.  All other systems are negative.  Review of Systems  Constitutional:  Positive for activity  change. Negative for fatigue and fever.  HENT:  Positive for trouble swallowing.   Eyes:  Negative for visual disturbance.  Respiratory:  Negative for cough and shortness of breath.   Gastrointestinal:  Negative for abdominal pain.  Neurological:  Positive for weakness.  Psychiatric/Behavioral:  Negative for behavioral problems and confusion.     Vital Signs: BP (!) 153/65 Comment: map 91  Pulse 67   Temp 98 F (36.7 C)   Resp 19   Ht '5\' 1"'$  (1.549 m)   Wt 117 lb (53.1 kg)   SpO2 96%   BMI 22.11 kg/m     Physical Exam Vitals reviewed.  HENT:     Mouth/Throat:     Mouth: Mucous membranes are moist.  Cardiovascular:     Rate and Rhythm: Normal rate and regular rhythm.  Pulmonary:     Effort: Pulmonary effort is normal.     Breath sounds: No wheezing.  Abdominal:     Palpations: Abdomen is soft.  Musculoskeletal:        General: Normal range of motion.     Comments: Hands are numb since surgery  Skin:    General: Skin is warm.  Neurological:     Mental Status: She is alert and oriented to person, place, and time.  Psychiatric:        Behavior: Behavior normal.     Imaging: DG Naso G Tube Plc W/Fl W/Rad  Result Date: 10/22/2022 CLINICAL DATA:  Feeding tube placement EXAM: NASO G TUBE PLACEMENT WITH FL AND WITH RAD CONTRAST:  None utilized FLUOROSCOPY: Fluoroscopy Time:  1 minute 6 seconds Radiation Exposure Index (if provided by the fluoroscopic device): 17.00 mGy Number of Acquired Spot Images: 1 COMPARISON:  None Available. FINDINGS: Attempted placement of feeding tube. Immediate epistaxis with introduction of the tube, potentially from attempts to place the tube yesterday; bleeding controlled by nasal pressure. Multiple times the feeding tube was placed into the trachea with a spontaneous cough reflex. Tube was removed. Despite repeated swallowing, the esophagus could not be intubated. Patient unable to flex chin to chest to aid in placement due to recent cervical  spine surgery and presence of soft cervical collar. IMPRESSION: Unsuccessful attempts to place feeding tube. Procedure and epistaxis discussed with  Dr. Wynetta Emery at conclusion of procedure. Electronically Signed   By: Lavonia Dana M.D.   On: 10/22/2022 12:02   DG Swallowing Func-Speech Pathology  Result Date: 10/21/2022 Table formatting from the original result was not included. MBSS at [facility], inpatient:  10/21/22 1200 SLP Visit Information SLP Received On 10/21/22 General Information HPI MUSETTE KISAMORE is a 88 y.o. 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  Patient was lying in bed with neck brace and most of the history was obtained from ED physician and niece at the bedside.  Per report, patient presented with cough and congestion a few days ago, her physician was contacted and recommended giving Mucinex, patient's aide called the niece this afternoon that patient was vomiting, niece activated EMS and on arrival of EMS team, she was noted to be hypoxic with an O2 sat of 84% on room air, NRB at 15 L was provided with improvement in oxygenation to 98%.  Patient was tachycardic at rate of 136, she was febrile with a temperature of 102F (orally).  Code sepsis was activated en route to the ED.;CXR indicated on 10/19/22 potential Aspiration PNA or B PNA.BSEcompleted this AM and MBSS recommended. Caregiver present No Diet Prior to this Study NPO Temperature  Normal Respiratory Status WFL Supplemental O2 Nasal cannula History of Recent Intubation Yes Length of Intubations (days) 1 days Date extubated 10/17/22 Number of Intubations 1 Self extubation  No Behavior/Cognition Alert;Cooperative Self-Feeding Abilities Able to self-feed Baseline vocal  quality/speech Normal Volitional Cough Able to elicit Volitional Cough Assessment Appears WFL Volitional Swallow Able to elicit Anatomy Presence of cervical hardware (posterior pharyngeal wall swelling s/p ACDF surgery on Friday) Orofacial Exam Oral Cavity Assessment WFL Oral Cavity - Dentition Adequate natural dentition Orofacial Anatomy WFL Oral Motor/Sensory Function WFL Thin Liquids (Level 0) Thin Liquids  Impaired Bolus delivery method Spoon Thin Liquid - Impairment Pharyngeal impairment Initiation of swallow  Valleculae Soft palate elevation Complete Tongue base retraction Wide column of contrast or air between tongue base and PPW Laryngeal elevation Complete superior movement of thyroid cartilage with complete approximation of arytenoids to epiglottic petiole Anterior hyoid excursion Complete Epiglottic movement Partial Laryngeal vestibule closure Incomplete Pharyngeal stripping wave  Present - diminished Pharyngeal contraction (A/P view only) N/A Pharyngoesophageal segment opening Partial distention/partial duration, partial obstruction of flow Pharyngeal residue Collection of residue within or on pharyngeal structures Location of pharyngeal residue Valleculae;Pharyngeal wall;Pyriform sinuses Penetration/Aspiration Scale (PAS) score 7.  Material enters airway, passes BELOW cords and not ejected out despite cough attempt by patient Mildly thick liquids (Level 2, nectar thick) Mildly thick liquids (Level 2, nectar thick) Impaired Bolus delivery method Spoon Mildly Thick Liquid - Impairment Pharyngeal impairment Initiation of swallow  Valleculae Soft palate elevation Complete Tongue base retraction Wide column of contrast or air between tongue base and PPW Laryngeal elevation Complete superior movement of thyroid cartilage with complete approximation of arytenoids to epiglottic petiole Anterior hyoid excursion Complete Epiglottic movement Partial Laryngeal vestibule closure Incomplete Pharyngeal stripping wave   Present - diminished Pharyngeal contraction (A/P view only) N/A Pharyngoesophageal segment opening Partial distention/partial duration, partial obstruction of flow Pharyngeal residue Collection of residue within or on pharyngeal structures Location of pharyngeal residue Valleculae;Pharyngeal wall;Pyriform sinuses Penetration/Aspiration Scale (PAS) score 7.  Material enters airway, passes BELOW cords  and not ejected out despite cough attempt by patient Moderately thick liquids (Level 3, honey thick) Moderately thick liquids (Level 3, honey thick) Impaired Moderately Thick Liquid - Impairment Pharyngeal impairment Initiation of swallow  Valleculae Soft palate elevation Complete Tongue base retraction Wide column of contrast or air between tongue base and PPW Laryngeal elevation Complete superior movement of thyroid cartilage with complete approximation of arytenoids to epiglottic petiole Anterior hyoid excursion Complete Epiglottic movement Partial Laryngeal vestibule closure Incomplete Pharyngeal stripping wave  Present - diminished Pharyngeal contraction (A/P view only) N/A Pharyngoesophageal segment opening Partial distention/partial duration, partial obstruction of flow Pharyngeal residue Collection of residue within or on pharyngeal structures Location of pharyngeal residue Valleculae;Pharyngeal wall;Pyriform sinuses Penetration/Aspiration Scale (PAS) score 7.  Material enters airway, passes BELOW cords and not ejected out despite cough attempt by patient Puree Puree Impaired Puree - Impairment Pharyngeal impairment Initiation of swallow Valleculae Soft palate elevation Complete Tongue base retraction Wide column of contrast or air between tongue base and PPW Laryngeal elevation Complete superior movement of thyroid cartilage with complete approximation of arytenoids to epiglottic petiole Anterior hyoid excursion Complete Epiglottic movement Partial Laryngeal vestibule closure Incomplete Pharyngeal stripping wave   Present - diminished Pharyngeal contraction (A/P view only) N/A Pharyngoesophageal segment opening Partial distention/partial duration, partial obstruction of flow Pharyngeal residue Collection of residue within or on pharyngeal structures;Majority of contrast within or on pharyngeal structures Location of pharyngeal residue Valleculae;Pharyngeal wall;Pyriform sinuses Penetration/Aspiration Scale (PAS) score 7.  Material enters airway, passes BELOW cords and not ejected out despite cough attempt by patient Solid Solid Not Tested Pill Pill Not Tested Compensatory Strategies Compensatory strategies Yes Effortful swallow Ineffective Multiple swallows Effective Effective Multiple Swallows Thin liquid (Level 0);Mildly thick liquid (Level 2, nectar thick);Moderately thick liquid (Level 3, honey thick);Puree Chin tuck Ineffective Left head turn Ineffective Right head turn Ineffective Clinical Impression Clinical Impression Pt presents with moderate/severe pharyngeal phase dysphagia characterized by vallecular trigger with tsp thin, NTL, HTL, and puree, posterior pharyngeal wall edema negatively impacting epiglottic deflection and pharyngeal constriction resulting in penetration during the swallow, aspiration after the swallow, and pharyngeal residue. Pt with episodes of gross aspiration after the swallow from the residuals which was sensed and mostly removed, but then unable to completely clear pharynx. Head turns left and right were attempted, but found to be ineffective. Pt with limited head movement due to soft neck brace and discomfort for chin down. Recommend NPO with alternative means of nutrition via NG in the hopes that swelling will decrease over the next 10-14 days. Pt has a very strong cough, however she is unable to completely clear all aspirates and then unable to completely clear pharynx at this time. Consider consultation with Dr. Trenton Gammon, who did the surgery to see if steroids could be beneficial during this  stage of her recovery. SLP to follow and can administer single ice chips for comfort and to help prevent muscle disuse atrophy (SLP for puree trials to assess for readiness for repeat instrumental assessment). RN to complete oral care and have suction at bedside. Pt was shown how to self use the [place] suction and encouraged to cough and clear. SLP will follow.  SLP Visit Diagnosis Dysphagia, pharyngeal phase (R13.13);Dysphagia, pharyngoesophageal phase (R13.14) Factors that may increase risk of adverse event in presence of aspiration ([place] 2021) Limited mobility;Frail or deconditioned;Frequent aspiration of large volumes Swallowing Evaluation Recommendations Recommendations NPO;Alternative means of nutrition - NG Tube Medication Administration Via alternative means Swallowing strategies   (trials puree  with SLP only) Postural changes Position pt fully upright for meals;Stay upright 30-60 min after meals Oral care recommendations Oral care QID (4x/day);Staff/trained caregiver to provide oral care Recommended consults Consider dietitian consultation Treatment Plan Treatment recommendations Therapy as outlined in treatment plan below Follow-up recommendations Skilled nursing-short term rehab (<3 hours/day) Assistance recommended at discharge Frequent or constant supervision/assistance Functional status assessment Patient has had a recent decline in their functional status and demonstrates the ability to make significant improvements in function in a reasonable and predictable amount of time. Treatment frequency Min 2x/week Treatment duration 1 week Interventions Aspiration precaution training;Trials of upgraded texture/liquids;Patient/family education Goal Planning Prognosis for Safe Diet Advancement Fair Barriers to Reach Goals Severity of deficits Patient/Family Stated Goal Swallow safely Consulted and agree with results and recommendations Patient;Physician SLP Time Calculation SLP Start Time (ACUTE  ONLY) 1151 SLP Stop Time (ACUTE ONLY) 1223 SLP Time Calculation (min) (ACUTE ONLY) 32 min SLP Evaluations $ SLP Speech Visit 1 Visit SLP Evaluations $MBS Swallow 1 Procedure Thank you, Genene Churn, [place]   [facility] Chest Port 1V same Day  Result Date: 10/21/2022 CLINICAL DATA:  Follow-up malpositioned NG tube. EXAM: PORTABLE CHEST 1 VIEW COMPARISON:  Earlier chest x-ray, same day. FINDINGS: The NG tube has been removed. No evidence of a right-sided pneumothorax. The left PICC line is stable. Persistent bibasilar atelectasis or infiltrates and small left pleural effusion. IMPRESSION: 1. Removal of malpositioned NG tube.  No right-sided pneumothorax. 2. Persistent bibasilar atelectasis or infiltrates and small left pleural effusion. Electronically Signed   By: Marijo Sanes M.D.   On: 10/21/2022 14:33   DG CHEST PORT 1 VIEW  Addendum Date: 10/21/2022   ADDENDUM REPORT: 10/21/2022 14:09 ADDENDUM: The original report was by Dr. Van Clines. The following addendum is by Dr. Van Clines: Critical Value/emergent results were called by telephone at the time of interpretation on 10/21/2022 at 2:05 pm to provider [facility] , who verbally acknowledged these results. Electronically Signed   By: Van Clines M.D.   On: 10/21/2022 14:09   Result Date: 10/21/2022 CLINICAL DATA:  Nasogastric tube placement. EXAM: PORTABLE CHEST 1 VIEW COMPARISON:  10/19/2022 FINDINGS: The nasogastric tube tip projects over the right lower lobe medially, and is fairly peripheral in the right lung. This tube is malposition. There is some continued airspace opacities in both lung bases along with blunting of the left costophrenic angle. Borderline enlargement of the cardiopericardial silhouette. Atherosclerotic calcification of the aortic arch. No current pneumothorax. Dextroconvex thoracolumbar scoliosis. Left upper quadrant density likely from contrast medium in the stomach. Left-sided PICC line tip:  Cavoatrial junction. IMPRESSION: 1. Malpositioned nasogastric tube with the tip projecting peripherally over the right lower lobe medially. No currently visible pneumothorax; removal recommended with immediate post removal chest radiograph to rule out post removal pneumothorax prior to attempts at replacement. Close clinical observation of the patient is recommended following removal of the tube until chest radiography can confirm the lack of a post-removal pneumothorax. 2. Continued airspace opacities in both lung bases with blunting of the left costophrenic angle. 3. Borderline enlargement of the cardiopericardial silhouette. 4. Left upper quadrant density likely from contrast medium in the stomach. 5. Dextroconvex thoracolumbar scoliosis. Radiology assistant personnel have been notified to put me in telephone contact with the referring physician or the referring physician's clinical representative in order to discuss these findings. Once this communication is established I will issue an addendum to this report for documentation purposes. Electronically Signed: By: Van Clines M.D. On:  10/21/2022 14:03   [name] EKG SITE RITE  Result Date: 10/20/2022 If Site Rite image not attached, placement could not be confirmed due to current cardiac rhythm.  DG Chest Port 1 View  Result Date: 10/19/2022 CLINICAL DATA:  Questionable sepsis. EXAM: PORTABLE CHEST 1 VIEW COMPARISON:  PA Lat 05/16/2022 FINDINGS: There is mild cardiomegaly without evidence of CHF. Mild aortic tortuosity and calcification with stable mediastinum. Since the prior study there has developed patchy airspace disease in both lung bases consistent with bilateral pneumonia or aspiration. Minimal pleural effusions have also developed. The mid and upper lung fields are clear. There are degenerative changes of the thoracic spine and old right axillary surgical clips. IMPRESSION: 1. New patchy airspace disease in both lung bases consistent with  bilateral pneumonia or aspiration. Minimal pleural effusions. Follow-up study recommended after treatment to ensure clearing. 2. Mild cardiomegaly without evidence of CHF. 3. Aortic atherosclerosis. Electronically Signed   By: Telford Nab M.D.   On: 10/19/2022 21:35   DG Cervical Spine 2 or 3 views  Result Date: 10/17/2022 CLINICAL DATA:  Fluoroscopic assistance for cervical spinal fusion EXAM: CERVICAL SPINE - 2-3 VIEW COMPARISON:  06/01/2022 FINDINGS: Fluoroscopic images show anterior surgical fusion from C3-C5 levels. Fluoroscopy time 7.3 seconds. Radiation dose 0.61 mGy. IMPRESSION: Fluoroscopic assistance was provided for anterior surgical fusion at C3-C4 and C4-C5 levels. Electronically Signed   By: Elmer Picker M.D.   On: 10/17/2022 18:38   DG C-Arm 1-60 Min-No Report  Result Date: 10/17/2022 Fluoroscopy was utilized by the requesting physician.  No radiographic interpretation.   DG C-Arm 1-60 Min-No Report  Result Date: 10/17/2022 Fluoroscopy was utilized by the requesting physician.  No radiographic interpretation.   CT ABDOMEN PELVIS WO CONTRAST  Result Date: 10/16/2022 CLINICAL DATA:  88 year old female presents with history of bladder cancer for follow-up. * Tracking Code: BO * EXAM: CT ABDOMEN AND PELVIS WITHOUT CONTRAST TECHNIQUE: Multidetector CT imaging of the abdomen and pelvis was performed following the standard protocol without IV contrast. RADIATION DOSE REDUCTION: This exam was performed according to the departmental dose-optimization program which includes automated exposure control, adjustment of the mA and/or kV according to patient size and/or use of iterative reconstruction technique. COMPARISON:  January 24, 2022. FINDINGS: Lower chest: Basilar atelectasis. No effusion. No consolidative changes. Hepatobiliary: Smooth hepatic contours. Post cholecystectomy. No gross biliary duct distension. No visible lesion on noncontrast imaging. Pancreas: Limited assessment  due to lack of intravenous contrast. Fatty atrophy favoring the head of the pancreas. Spleen: Normal. Adrenals/Urinary Tract: Adrenal glands are normal. There is new fullness of LEFT ureter and intrarenal collecting systems since prior imaging with mild LEFT hydronephrosis. No perinephric stranding. No gross calculus in the distal LEFT ureter. Transition does occur at the LEFT bladder base however. This areas obscured by streak artifact from hip arthroplasty changes. Large staghorn calculus in the interpolar and lower pole or RIGHT intrarenal collecting system similar to prior imaging. Enlarging intermediate density lesion arises from the lower pole the RIGHT kidney measuring 59 Hounsfield units at 1.5 cm previously 1.2 cm in May of 2023. No gross stranding adjacent urinary bladder. Stomach/Bowel: No acute gastrointestinal findings. Signs of prior bowel resection with ileocolonic anastomosis. Dilute contrast passes into the colon with patulous appearance of the proximal colon. Vascular/Lymphatic: Aortic atherosclerosis. No sign of aneurysm. Smooth contour of the IVC. There is no gastrohepatic or hepatoduodenal ligament lymphadenopathy. No retroperitoneal or mesenteric lymphadenopathy. No pelvic sidewall lymphadenopathy. Limited assessment of vascular structures due to lack of intravenous  contrast. Reproductive: Cystic RIGHT adnexal lesion stable 4 cm greatest axial dimension only minimally changed since imaging from 2017. Other: No ascites.  No pneumoperitoneum. Musculoskeletal: RIGHT hip arthroplasty, incompletely imaged. Osteopenia. Spinal degenerative changes. IMPRESSION: 1. New mild LEFT hydroureteronephrosis in this patient with history of prior bladder neoplasm. Stricture related to therapy or lesion are considered as causes in this patient with history of cancer near the trigone. Cystoscopic assessment may be warranted if not recently performed. 2. Large staghorn calculus of the RIGHT kidney. 3. RIGHT renal  lesion with intermediate density more likely hemorrhagic cyst based on simple appearance seen on prior imaging studies. Consider follow-up renal sonogram. 4. Cystic RIGHT adnexal lesion stable 4 cm greatest axial dimension only minimally changed since imaging from 2017. Recommend follow-up US in 6-12 months. Note: This recommendation does not apply to premenarchal patients and to those with increased risk (genetic, family history, elevated tumor markers or other high-risk factors) of ovarian cancer. Reference: JACR 2020 Feb; 17(2):248-254 5. Aortic atherosclerosis. Aortic Atherosclerosis (ICD10-I70.0). Electronically Signed   By: Zetta Bills M.D.   On: 10/16/2022 14:05    Labs:  CBC: Recent Labs    10/22/22 0508 10/23/22 0426 10/24/22 0351 10/25/22 0508  WBC 8.9 5.8 11.4* 12.7*  HGB 9.7* 10.3* 11.1* 10.9*  HCT 30.5* 32.4* 34.2* 33.6*  PLT 161 159 268 211    COAGS: Recent Labs    02/10/22 1346 02/11/22 0538 10/19/22 2130  INR 1.1 1.1 1.0  APTT 35  --  24    BMP: Recent Labs    10/25/22 0508 10/26/22 0510 10/28/22 0430 10/29/22 0628  NA 146* 139 137 136  K 3.2* 3.8 3.2* 4.0  CL 114* 110 105 107  CO2 '24 23 23 22  '$ GLUCOSE 144* 107* 134* 163*  BUN 35* 39* 33* 36*  CALCIUM 9.0 8.7* 8.8* 8.4*  CREATININE 1.25* 1.23* 1.29* 1.32*  GFRNONAA 40* 40* 38* 37*    LIVER FUNCTION TESTS: Recent Labs    10/24/22 0351 10/25/22 0508 10/28/22 0430 10/29/22 0628  BILITOT 0.8 0.8 0.8 0.6  AST '15 25 28 19  '$ ALT 10 18 33 25  ALKPHOS 72 75 74 73  PROT 6.8 6.3* 5.8* 5.8*  ALBUMIN 3.3* 3.1* 3.1* 2.9*    TUMOR MARKERS: No results for input(s): "AFPTM", "CEA", "CA199", "CHROMGRNA" in the last 8760 hours.  Assessment and Plan:  Severe dysphagia Aspiration pna Scheduled for percutaneous gastric tube placement Risks and benefits image guided gastrostomy tube placement was discussed with the patient including, but not limited to the need for a barium enema during the procedure,  bleeding, infection, peritonitis and/or damage to adjacent structures.  All of the patient's questions were answered, patient is agreeable to proceed.  Consent signed and in chart.  Thank you for this interesting consult.  I greatly enjoyed meeting KAMIL HANIGAN and look forward to participating in their care.  A copy of this report was sent to the requesting provider on this date.  Electronically Signed: Lavonia Drafts, PA-C 10/29/2022, 9:19 AM   I spent a total of 20 Minutes    in face to face in clinical consultation, greater than 50% of which was counseling/coordinating care for perc G tube

## 2022-10-29 NOTE — Progress Notes (Signed)
PHARMACY - TOTAL PARENTERAL NUTRITION CONSULT NOTE   Indication: intolerance to enteral feed  Patient Measurements: Height: '5\' 1"'$  (154.9 cm) Weight: 53.1 kg (117 lb) IBW/kg (Calculated) : 47.8 TPN AdjBW (KG): 50.8 Body mass index is 22.11 kg/m. Usual Weight:   Assessment: Patient is a 88 yo female presents with CAP. PMH: GERD (hiatal hernia), short gut syndrome (2004), anemia, HTN.. Patient to have PEG placed 11/15. Plan for 24 more hours of TPN and the F/U plans for continuation of TPN and start Tube feedings.  Glucose / Insulin: 106-160. 6 units insulin given.  Electrolytes: K 3.2> 4 Renal: Scr 1.29 Hepatic: WNL Intake / Output; MIVF: D5W @ 50 mL/hr GI Imagine/Surgeries / Procedures:   Central access: PICC 11/6 TPN start date: 11/14  Nutritional Goals: Goal TPN rate is 70 mL/hr (provides 50 g of protein and 1528 kcals per day)  RD Assessment: Estimated Needs Total Energy Estimated Needs: 0923-3007 Total Protein Estimated Needs: 77-86 gr Total Fluid Estimated Needs: > 1.5 liters daily  Current Nutrition:  Oral supplements  Plan:  Increase  TPN to 70 mL/hr at 1800 Electrolytes in TPN: Na 23mq/L, K 577m/L, Ca 70m71mL, Mg 70mE370m, and Phos 170mm42m. Cl:Ac 1:1 Add standard MVI and trace elements to TPN Initiate Sensitive q6h SSI and adjust as needed  Stop MIVF at 1800 Monitor TPN labs on Mon/Thurs,   LorieIsac SarnaPharm D, BCPS Clinical Pharmacist 10/29/2022 8:33 AM

## 2022-10-29 NOTE — Progress Notes (Signed)
OT Cancellation Note  Patient Details Name: MARELIN TAT MRN: [id number] DOB: [birth year]   Cancelled Treatment:    Reason Eval/Treat Not Completed: Patient at procedure or test/ unavailable. Pt was not in her room when treatment was attempted. Will try to see pt later as time permits.   Momoko Slezak OT, MOT   Larey Seat 10/29/2022, 9:18 AM

## 2022-10-30 DIAGNOSIS — N1832 Chronic kidney disease, stage 3b: Secondary | ICD-10-CM | POA: Diagnosis not present

## 2022-10-30 DIAGNOSIS — M4802 Spinal stenosis, cervical region: Secondary | ICD-10-CM | POA: Diagnosis not present

## 2022-10-30 DIAGNOSIS — J9601 Acute respiratory failure with hypoxia: Secondary | ICD-10-CM | POA: Diagnosis not present

## 2022-10-30 DIAGNOSIS — J690 Pneumonitis due to inhalation of food and vomit: Secondary | ICD-10-CM | POA: Diagnosis not present

## 2022-10-30 LAB — GLUCOSE, CAPILLARY
Glucose-Capillary: 153 mg/dL — ABNORMAL HIGH (ref 70–99)
Glucose-Capillary: 160 mg/dL — ABNORMAL HIGH (ref 70–99)
Glucose-Capillary: 127 mg/dL — ABNORMAL HIGH (ref 70–99)

## 2022-10-30 LAB — COMPREHENSIVE METABOLIC PANEL
ALT: 19 U/L (ref 0–44)
AST: 19 U/L (ref 15–41)
Albumin: 3 g/dL — ABNORMAL LOW (ref 3.5–5.0)
Alkaline Phosphatase: 74 U/L (ref 38–126)
Anion gap: 6 (ref 5–15)
BUN: 40 mg/dL — ABNORMAL HIGH (ref 8–23)
CO2: 21 mmol/L — ABNORMAL LOW (ref 22–32)
Calcium: 8.8 mg/dL — ABNORMAL LOW (ref 8.9–10.3)
Chloride: 110 mmol/L (ref 98–111)
Creatinine, Ser: 1.16 mg/dL — ABNORMAL HIGH (ref 0.44–1.00)
GFR, Estimated: 43 mL/min — ABNORMAL LOW (ref >60)
Glucose, Bld: 132 mg/dL — ABNORMAL HIGH (ref 70–99)
Potassium: 4.4 mmol/L (ref 3.5–5.1)
Sodium: 137 mmol/L (ref 135–145)
Total Bilirubin: 0.4 mg/dL (ref 0.3–1.2)
Total Protein: 6 g/dL — ABNORMAL LOW (ref 6.5–8.1)

## 2022-10-30 LAB — MAGNESIUM: Magnesium: 2.2 mg/dL (ref 1.7–2.4)

## 2022-10-30 LAB — PHOSPHORUS: Phosphorus: 2.9 mg/dL (ref 2.5–4.6)

## 2022-10-30 MED ORDER — LEVOTHYROXINE SODIUM 75 MCG PO TABS
75.0000 ug | ORAL_TABLET | Freq: Every day | ORAL | Status: DC
  Administered 2022-10-31: 75 ug
  Filled 2022-10-30: qty 1

## 2022-10-30 MED ORDER — JEVITY 1.2 CAL PO LIQD
1000.0000 mL | ORAL | Status: DC
  Administered 2022-10-30: 1000 mL

## 2022-10-30 MED ORDER — GABAPENTIN 250 MG/5ML PO SOLN
200.0000 mg | Freq: Every day | ORAL | Status: DC
  Administered 2022-10-30: 200 mg
  Filled 2022-10-30 (×4): qty 4

## 2022-10-30 MED ORDER — METHOCARBAMOL 500 MG PO TABS
750.0000 mg | ORAL_TABLET | Freq: Four times a day (QID) | ORAL | Status: DC | PRN
  Administered 2022-10-30 – 2022-10-31 (×2): 750 mg
  Filled 2022-10-30 (×2): qty 2

## 2022-10-30 MED ORDER — OXYCODONE HCL 5 MG PO TABS
5.0000 mg | ORAL_TABLET | ORAL | Status: DC | PRN
  Administered 2022-10-30: 5 mg
  Filled 2022-10-30: qty 1

## 2022-10-30 MED ORDER — ACETAMINOPHEN 325 MG PO TABS: 650.0000 mg | ORAL_TABLET | Freq: Four times a day (QID) | ORAL | Status: DC | PRN

## 2022-10-30 MED ORDER — METOPROLOL TARTRATE 50 MG PO TABS: 75.0000 mg | ORAL_TABLET | Freq: Two times a day (BID) | ORAL | Status: DC

## 2022-10-30 MED ORDER — AMLODIPINE BESYLATE 5 MG PO TABS
10.0000 mg | ORAL_TABLET | Freq: Every day | ORAL | Status: DC
  Administered 2022-10-30 – 2022-10-31 (×2): 10 mg
  Filled 2022-10-30 (×2): qty 2

## 2022-10-30 MED ORDER — ONDANSETRON HCL 4 MG PO TABS: 4.0000 mg | ORAL_TABLET | Freq: Four times a day (QID) | ORAL | Status: DC | PRN

## 2022-10-30 MED ORDER — FAMOTIDINE 40 MG/5ML PO SUSR: 20.0000 mg | Freq: Two times a day (BID) | ORAL | Status: DC

## 2022-10-30 MED ORDER — DEXAMETHASONE 4 MG PO TABS
2.0000 mg | ORAL_TABLET | Freq: Two times a day (BID) | ORAL | Status: DC
  Administered 2022-10-30 – 2022-10-31 (×2): 2 mg
  Filled 2022-10-30 (×2): qty 1

## 2022-10-30 MED ORDER — ONDANSETRON HCL 4 MG/2ML IJ SOLN: 4.0000 mg | Freq: Four times a day (QID) | INTRAMUSCULAR | Status: DC | PRN

## 2022-10-30 MED ORDER — ACETAMINOPHEN 650 MG RE SUPP: 650.0000 mg | Freq: Four times a day (QID) | RECTAL | Status: DC | PRN

## 2022-10-30 MED ORDER — LEVOTHYROXINE SODIUM 75 MCG PO TABS
75.0000 ug | ORAL_TABLET | Freq: Every day | ORAL | Status: DC
  Administered 2022-10-30: 75 ug
  Filled 2022-10-30: qty 1

## 2022-10-30 MED ORDER — LANSOPRAZOLE 3 MG/ML SUSP: 15.0000 mg | Freq: Two times a day (BID) | ORAL | Status: DC

## 2022-10-30 MED ORDER — FAMOTIDINE 20 MG PO TABS
20.0000 mg | ORAL_TABLET | Freq: Every day | ORAL | Status: DC
  Administered 2022-10-30 – 2022-10-31 (×2): 20 mg
  Filled 2022-10-30 (×2): qty 1

## 2022-10-30 MED ORDER — GABAPENTIN 250 MG/5ML PO SOLN
100.0000 mg | Freq: Two times a day (BID) | ORAL | Status: DC
  Administered 2022-10-30 – 2022-10-31 (×3): 100 mg
  Filled 2022-10-30 (×9): qty 2

## 2022-10-30 MED ORDER — METOPROLOL TARTRATE 50 MG PO TABS
75.0000 mg | ORAL_TABLET | Freq: Two times a day (BID) | ORAL | Status: DC
  Administered 2022-10-30 – 2022-10-31 (×2): 75 mg
  Filled 2022-10-30 (×2): qty 1

## 2022-10-30 NOTE — Progress Notes (Signed)
Occupational Therapy Treatment Patient Details Name: Jane Andrews MRN: [id number] DOB: March 12, 1925 Today's Date: 10/30/2022   History of present illness Jane Andrews is a 88 y.o. 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  Patient was laying in bed with neck brace and most of the history was obtained from ED physician and niece at the bedside.  Per report, patient presented with cough and congestion a few days ago, her physician was contacted and recommended giving Mucinex, patient's aide called the niece this afternoon that patient was vomiting, niece activated EMS and on arrival of EMS team, she was noted to be hypoxic with an O2 sat of 84% on room air, NRB at 15 L was provided with improvement in oxygenation to 98%.  Patient was tachycardic at rate of 136, she was febrile with a temperature of 102F (orally).  Code sepsis was activated en route to the ED.   OT comments  Pt agreeable to OT treatment. Pt continues to present very weak needing moderate assistance for bed mobility and functional transfers. Pt also required assist for peri-care in bed. Pt's legs buckled slightly but she was able to maintain standing balance using RW. Pt was left in chair with call bell within reach. Pt will benefit from continued OT in the hospital and recommended venue below to increase strength, balance, and endurance for safe ADL's.      Recommendations for follow up therapy are one component of a multi-disciplinary discharge planning process, led by the attending physician.  Recommendations may be updated based on patient status, additional functional criteria and insurance authorization.    Follow Up Recommendations  Skilled nursing-short term rehab (<3  hours/day)     Assistance Recommended at Discharge Intermittent Supervision/Assistance  Patient can return home with the following  A little help with walking and/or transfers;A lot of help with bathing/dressing/bathroom;Assistance with cooking/housework;Assist for transportation;Help with stairs or ramp for entrance   Equipment Recommendations  None recommended by OT    Recommendations for Other Services      Precautions / Restrictions Precautions Precautions: Fall Required Braces or Orthoses: Cervical Brace Cervical Brace: Soft collar;At all times Restrictions Weight Bearing Restrictions: No       Mobility Bed Mobility Overal bed mobility: Needs Assistance Bed Mobility: Supine to Sit Rolling: Min guard   Supine to sit: Mod assist     General bed mobility comments: Pt able to move B LE towards EOB with assist to pull torso to upright position.    Transfers Overall transfer level: Needs assistance Equipment used: Rolling walker (2 wheels) Transfers: Sit to/from Stand, Bed to chair/wheelchair/BSC Sit to Stand: Mod assist     Step pivot transfers: Mod assist     General transfer comment: Slow labored movement; signs of weakness via pt's legs buckling at times but she was able to recover and bear weight through B UE using RW.     Balance Overall balance assessment: Needs assistance Sitting-balance support: Bilateral upper extremity supported, Feet supported Sitting balance-Leahy Scale: Fair Sitting balance - Comments: seated at EOB   Standing balance support: Reliant on assistive device for balance, During functional activity, Bilateral upper extremity supported Standing balance-Leahy Scale: Poor Standing balance comment: fair/poor using RW  ADL either performed or assessed with clinical judgement   ADL Overall ADL's : Needs assistance/impaired                             Toileting- Clothing Manipulation and  Hygiene: Total assistance;Bed level Toileting - Clothing Manipulation Details (indicate cue type and reason): Pt assited for peri-care at bed level prior to transfer. Pt was able to roll but needed assist for peri-care.                        Cognition Arousal/Alertness: Awake/alert Behavior During Therapy: WFL for tasks assessed/performed Overall Cognitive Status: Within Functional Limits for tasks assessed                                                             Pertinent Vitals/ Pain       Pain Assessment Pain Assessment: No/denies pain                                                          Frequency  Min 1X/week        Progress Toward Goals  OT Goals(current goals can now be found in the care plan section)  Progress towards OT goals: Progressing toward goals  Acute Rehab OT Goals Patient Stated Goal: Go to rehab. OT Goal Formulation: With patient/family Time For Goal Achievement: 11/05/22 Potential to Achieve Goals: Good ADL Goals Pt Will Perform Grooming: with min guard assist;sitting Pt Will Transfer to Toilet: with modified independence;ambulating Pt/caregiver will Perform Home Exercise Program: Increased ROM;Increased strength;Both right and left upper extremity;Independently Additional ADL Goal #1: Niece with verbalize understanding of handout and what information she needs to relay to pt's PCA's.  Plan Discharge plan remains appropriate                                    End of Session Equipment Utilized During Treatment: Rolling walker (2 wheels);Cervical collar  OT Visit Diagnosis: Unsteadiness on feet (R26.81);Other abnormalities of gait and mobility (R26.89);Muscle weakness (generalized) (M62.81)   Activity Tolerance Patient tolerated treatment well   Patient Left in chair;with call bell/phone within reach   Nurse Communication Mobility status (NT notified.)         Time: 9702-6378 OT Time Calculation (min): 20 min  Charges: OT General Charges $OT Visit: 1 Visit OT Treatments $Self Care/Home Management : 8-22 mins  Tauheedah Bok OT, MOT  Larey Seat 10/30/2022, 9:56 AM

## 2022-10-30 NOTE — Progress Notes (Signed)
PT Cancellation Note  Patient Details Name: Jane Andrews MRN: [id number] DOB: 1925-08-21   Cancelled Treatment:    Reason Eval/Treat Not Completed: Patient declined, no reason specified Attempted PT session.  Pt declined due to visitors currently in room, pt stated she is going to have PT tomorrow at [facility] and wants to wait.  Ihor Austin, LPTA/CLT; CBIS [phone]  Aldona Lento 10/30/2022, 1:24 PM

## 2022-10-30 NOTE — Progress Notes (Signed)
Speech Language Pathology Treatment: Dysphagia  Patient Details Name: Jane Andrews MRN: [id number] DOB: [birth year] Today's Date: 10/30/2022 Time: 8921-1941 SLP Time Calculation (min) (ACUTE ONLY): 14 min  Assessment / Plan / Recommendation Clinical Impression  Pt seen for ongoing dysphagia intervention. She had PEG placed yesterday. She appears in brighter spirits and has strong, clear vocal quality. Pt accepted several ice chips presented one at a time. Pt with suspected delay in swallow trigger and occasional delayed throat clearing and cough. Pt encouraged to cough as needed and repeat swallows. She expressed gratitude for ice chips. Pt to go to [facility] SN[facility] tomorrow. Recommend f/u SLP therapy for dysphagia with ice chip and water trials and puree. Pt will need repeat MBSS when deemed clinically appropriate per treating SLP at SNF.     HPI HPI: Jane Andrews is a 88 y.o. 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 Patient was lying in bed with neck brace and most of the history was obtained from ED physician and niece at the bedside. Per report, patient presented with cough and congestion a few days ago, her physician was contacted and recommended giving Mucinex, patient's aide called the niece this afternoon that patient was vomiting, niece activated EMS and on arrival of EMS team, she was noted to be hypoxic with an O2 sat of 84% on room air, NRB at 15 L was provided with improvement in oxygenation to 98%. Patient was tachycardic at rate of 136, she was febrile with a temperature of 102F (orally). Code sepsis was activated en route to the ED.;CXR indicated on 10/19/22 potential Aspiration PNA or B PNA.BSEcompleted this AM and MBSS  recommended; MBS completed and recommendation for NPO w/ unsuccessful attempt at non-oral feeding/PEG placement.  ST f/u for PO readiness.      SLP Plan  Continue with current plan of care      Recommendations for follow up therapy are one component of a multi-disciplinary discharge planning process, led by the attending physician.  Recommendations may be updated based on patient status, additional functional criteria and insurance authorization.    Recommendations  Diet recommendations: NPO (ice chips prn after oral care when alert and upright) Liquids provided via: Teaspoon Medication Administration: Via alternative means Supervision: Staff to assist with self feeding Postural Changes and/or Swallow Maneuvers: Seated upright 90 degrees;Upright 30-60 min after meal                Oral Care Recommendations: Oral care QID;Oral care prior to ice chip/H20;Staff/trained caregiver to provide oral care Follow Up Recommendations: Skilled nursing-short term rehab (<3 hours/day) Assistance recommended at discharge: Frequent or constant Supervision/Assistance SLP Visit Diagnosis: Dysphagia, pharyngeal phase (R13.13);Dysphagia, oropharyngeal phase (R13.12) Plan: Continue with current plan of care          Thank you,  Genene Churn, [place]  [place]  10/30/2022, 4:12 PM

## 2022-10-30 NOTE — TOC Progression Note (Signed)
Transition of Care [facility]) - Progression Note    Patient Details  Name: Jane Andrews MRN: [id number] Date of Birth: 07/06/1925  [facility] CM/SW Contact  Boneta Lucks, RN Phone Number: 10/30/2022, 1:42 PM  Clinical Narrative:   Confirmed [facility] will have a bed tomorrow. Patient needs a COVID test, MD aware.    Barriers to Discharge: Continued Medical Work up  Expected Discharge Plan and Services   In-house Referral: Clinical Social Work     Living arrangements for the past 2 months: Single Family Home                      Readmission Risk Interventions    10/21/2022    9:21 AM 06/15/2020   10:21 AM  Readmission Risk Prevention Plan  Medication Screening  Complete  Transportation Screening Complete Complete  Medication Review (RN Care Manager) Complete   HRI or [place] Complete   SW Recovery Care/Counseling Consult Complete   Palliative Care Screening Not [place] Not Applicable

## 2022-10-30 NOTE — Consult Note (Signed)
   [facility] Inpatient Consult   10/30/2022  Jane Andrews 1925-01-20 [id number]  [place] [facility] [ACO] Patient: Medicare [facility] Liaison remote coverage review for [facility]   Primary Care Provider:  Redmond School, MD with [facility] is listed for the transition of care follow up   Patient screened for less than 7 days readmission hospitalization with noted extreme high risk score for unplanned readmission risk and for long length of stay. Also, to assess for potential [place] Management service needs for post hospital transition for care coordination.  Review of patient's electronic medical record reveals patient is being recommended for a skilled nursing facility [SNF] for short term rehab.   Plan:  Continue to follow progress and disposition to assess for post hospital community care coordination/management needs.  Referral request for community care coordination: *if patient does transition to a SNF affiliated with [facility] can have the [facility] RN follow for returning to community care coordination needs.  Sent inpatient [facility] RNCM on Care Teams a secure chat message that this writer is following for post hospital community needs.  Of note, [facility] Care Management/Population Health does not replace or interfere with any arrangements made by the Inpatient Transition of Care team.  For questions contact:   Natividad Brood, RN BSN [place]  [phone] business mobile phone Toll free office [phone]  *[place]  [phone] Fax number: [phone] [email or web address]

## 2022-10-30 NOTE — Progress Notes (Signed)
Pt able to swallow robaxin and gabapentin last night to manage pain.  Declines needing additional medicine for pain.  Ativan given x 1.  TPN infusing at 81m/hr.  Pt tolerating well.  Peg tube in place.

## 2022-10-30 NOTE — Progress Notes (Signed)
PROGRESS NOTE  Jane Andrews QPR:[id number] DOB: 15-Apr-1925 DOA: 10/19/2022 PCP: Redmond [employer], MD  Brief History:  88 y.o. 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  Patient was laying in bed with neck brace and most of the history was obtained from ED physician and niece at the bedside.  Per report, patient presented with cough and congestion a few days ago, her physician was contacted and recommended giving Mucinex, patient's aide called the niece this afternoon that patient was vomiting, niece activated EMS and on arrival of EMS team, she was noted to be hypoxic with an O2 sat of 84% on room air, NRB at 15 L was provided with improvement in oxygenation to 98%.  Patient was tachycardic at rate of 136, she was febrile with a temperature of 102F (orally).  Code sepsis was activated en route to the ED.   ED Course:  In the emergency department, temperature was 98.6 F, respiration was 25/min, HR 138 bpm, BP was 110/65, O2 sat was 94% on supplemental oxygen at 2 LPM.  Work-up in the ED showed normocytic anemia with WBC of 11.4, BMP showed sodium 136, potassium 4.3, chloride 107, bicarb 20, glucose 116, BUN 30, creatinine 1.88 (baseline creatinine at 1.5-1.7).  Albumin 3.0, AST 42, ALT 14, ALP 117.  Lactic acid was 1.8 > 1.4.  Blood culture pending, influenza A, B, SARS coronavirus 2 was negative.  Chest x-ray showed new  patchy airspace disease in both lung bases consistent with bilateral pneumonia or aspiration. Minimal pleural effusions. He was treated with IV ceftriaxone and azithromycin, IV hydration was provided.  Hospitalist was asked to admit patient for further evaluation and management.     Assessment/Plan: Acute Respiratory  Failure with Hypoxia/ Aspiration pneumonia -now stable on RA - IV Unasyn course completed 11/9 - supportive measures   Dysphagia  - severe - postop complication of ACDF - discussed with neurosurgeon Dr. Trenton Gammon - he recommended IV decadron 4 mg IV Q6h which we did for 5 days but no meaningful improvement in her ability to swallow now weaning down decadron - initially felt patient should improve in 3-5 days, then can rechallenge orally, unfortunately this did not work and she will need to remain NPO. - continue NPO status for now -SLP following and will plan on repeat MBSS (Monday) 11/13 -->failed study, ongoing aspiration seen, recommended alternative means for nutrition  -Discussed with surgery, unable to pass scope due to severe edema; consulted to IR for image guided G tube placement  -IR made arrangements for transfer to [facility] on 11/15 in AM for image guided G tube placement  -TNA started 10/28/22 per pharmD, thru PICC line, hopefully if G tube placement successful hopefully we can stop TNA and begin enteral feeding.   -gastrostomy tube placed 11/15 -started enteral feeding 11/16   Stage 3b CKD - stable, following  -baseline creatinine 1.2-1.5   Persistent Atrial Fibrillation  - unable to take oral metoprolol due to dyphagia - continue IV lopressor 12.5 mg Q6h>>po metoprolol per tube - per cardiologist, not a candidate for anticoagulation   Hypothyroidism  - I have asked the pharmacy if she can have a dose of IV levothyroxine as she has been without oral dose for 1 week now - can  give dose today levothyroxine 37.5 mcg x 1 dose 11/12, repeat dose 11/14 -restart po levothyroxine per tube 11/16   Overactive Bladder - unable to resume home myrbetriq until she can take p.o. again>>per tube 11/16   Cervical stenosis s/p ACDF - discussed with neurosurgeon Dr. Trenton Gammon on 11/8 - he recommended starting IV decadron 4 mg every 6 hours on 11/8, reducing dose to Q12 hours on 11/12, he felt the  edema was an expected complication of the surgery and could improve in 3-5 days however we have not seen that improvement - continue neck brace and plan for outpatient follow up with Dr. Trenton Gammon for wound check - IV pain management until able to take p.o. >>po oxycodone   Pyuria - asymptomatic, DC IV fluconazole    Hypertension - uncontrolled - persistently elevated BP likely elevated partly due to IV steroids which we are weaning down   - Patient with SBP consistently in upper 160's. Likely secondary to pain  -IV Hydralazine ordered and Metoprolol increased to 12.5 mg q6, increased IV hydralazine in attempt to improve blood pressures -IV Ativan ordered -Continue to monitor -11/16--start po amlodipine and po metoprolol   Hyperglycemia - Steroid induced  Secondary to IV Decadron. Continue SSI coverage and CBG monitoring BS coming down as decadron dose coming down      Depression / Adjustment Disorder - Pt has been unable to take her antidepressives for over a week now due to severe dysphagia - plan to restart cymbalta when able to use PEG - we did give her an IV dose of levothyroxine on 11/12, 11/14             Family Communication:   Family at bedside updated 11/16   Consultants:  IR   Code Status:  DNR   DVT Prophylaxis:  SCDs     Procedures: As Listed in Progress Note Above   Antibiotics: None         Subjective: Complains of coccyx and neck pain.  Denies f/c, cp,sob, n/v/d, abd pain  Objective: Vitals:   10/29/22 1716 10/29/22 2040 10/30/22 0509 10/30/22 1228  BP: (!) 156/75 (!) 161/64 (!) 141/61 (!) 168/75  Pulse: (!) 59 77 66 71  Resp: '16 16 16 20  '$ Temp:  (!) 97.5 F (36.4 C) (!) 97.5 F (36.4 C)   TempSrc:  Oral Oral   SpO2: 100% 99% 99% 99%  Weight:      Height:        Intake/Output Summary (Last 24 hours) at 10/30/2022 1413 Last data filed at 10/30/2022 1250 Gross per 24 hour  Intake 998.27 ml  Output 400 ml  Net 598.27 ml   Weight  change:  Exam:  General:  Pt is alert, follows commands appropriately, not in acute distress HEENT: No icterus, No thrush, No neck mass, [place]/AT Cardiovascular: RRR, S1/S2, no rubs, no gallops Respiratory: bibasilar rales.  Now heeze Abdomen: Soft/+BS, non tender, non distended, no guarding Extremities: No edema, No lymphangitis, No petechiae, No rashes, no synovitis   Data Reviewed: I have personally reviewed following labs and imaging studies Basic Metabolic Panel: Recent Labs  Lab 10/24/22 0351 10/25/22 0508 10/26/22 0510 10/28/22 0430 10/29/22 0628 10/30/22 0500  NA 147* 146* 139 137 136 137  K 3.3* 3.2* 3.8 3.2* 4.0 4.4  CL 115* 114* 110 105 107 110  CO2 19* '24 23 23 22 '$ 21*  GLUCOSE 222* 144* 107* 134* 163* 132*  BUN 31* 35* 39* 33* 36* 40*  CREATININE 1.47*  1.25* 1.23* 1.29* 1.32* 1.16*  CALCIUM 9.1 9.0 8.7* 8.8* 8.4* 8.8*  MG 2.0 2.0  --  1.8 2.4 2.2  PHOS  --   --   --  2.7 3.0 2.9   Liver Function Tests: Recent Labs  Lab 10/24/22 0351 10/25/22 0508 10/28/22 0430 10/29/22 0628 10/30/22 0500  AST '15 25 28 19 19  '$ ALT 10 18 33 25 19  ALKPHOS 72 75 74 73 74  BILITOT 0.8 0.8 0.8 0.6 0.4  PROT 6.8 6.3* 5.8* 5.8* 6.0*  ALBUMIN 3.3* 3.1* 3.1* 2.9* 3.0*   No results for input(s): "LIPASE", "AMYLASE" in the last 168 hours. No results for input(s): "AMMONIA" in the last 168 hours. Coagulation Profile: No results for input(s): "INR", "PROTIME" in the last 168 hours. CBC: Recent Labs  Lab 10/24/22 0351 10/25/22 0508  WBC 11.4* 12.7*  HGB 11.1* 10.9*  HCT 34.2* 33.6*  MCV 84.7 85.3  PLT 268 211   Cardiac Enzymes: No results for input(s): "CKTOTAL", "CKMB", "CKMBINDEX", "TROPONINI" in the last 168 hours. BNP: Invalid input(s): "POCBNP" CBG: Recent Labs  Lab 10/29/22 0552 10/29/22 1607 10/29/22 2350 10/30/22 0519 10/30/22 1224  GLUCAP 160* 133* 156* 153* 160*   HbA1C: No results for input(s): "HGBA1C" in the last 72 hours. Urine analysis:     Component Value Date/Time   COLORURINE GREEN (A) 10/19/2022 2101   APPEARANCEUR HAZY (A) 10/19/2022 2101   APPEARANCEUR Clear 08/05/2022 1525   LABSPEC 1.014 10/19/2022 2101   PHURINE 5.0 10/19/2022 2101   GLUCOSEU NEGATIVE 10/19/2022 2101   HGBUR MODERATE (A) 10/19/2022 2101   BILIRUBINUR NEGATIVE 10/19/2022 2101   BILIRUBINUR Negative 08/05/2022 [place] 10/19/2022 2101   PROTEINUR 30 (A) 10/19/2022 2101   UROBILINOGEN 0.2 01/16/2015 0848   NITRITE NEGATIVE 10/19/2022 2101   LEUKOCYTESUR LARGE (A) 10/19/2022 2101   Sepsis Labs: '@LABRCNTIP'$ (procalcitonin:4,lacticidven:4) )No results found for this or any previous visit (from the past 240 hour(s)).   Scheduled Meds:  alteplase  2 mg Intracatheter Once   amLODipine  10 mg Per Tube Daily   Chlorhexidine Gluconate Cloth  6 each Topical Daily   dexamethasone  2 mg Per Tube Q12H   DULoxetine  60 mg Oral q morning   gabapentin  100 mg Per Tube BID   And   gabapentin  200 mg Per Tube QHS   insulin aspart  0-9 Units Subcutaneous Q6H   lansoprazole  15 mg Per Tube BID   levothyroxine  75 mcg Per Tube Q0600   metoprolol tartrate  12.5 mg Intravenous Q6H   metoprolol tartrate  75 mg Per Tube BID   mirabegron ER  50 mg Oral Daily   sodium chloride flush  10-40 mL Intracatheter Q12H   Continuous Infusions:  sodium chloride Stopped (10/25/22 0532)   feeding supplement (JEVITY 1.2 CAL) 1,000 mL (10/30/22 1053)   TPN ADULT (ION) 70 mL/hr at 10/30/22 1610    Procedures/Studies: IR GASTROSTOMY TUBE MOD SED  Result Date: 10/29/2022 INDICATION: Recent neck surgery with persistent postoperative dysphagia. Please perform percutaneous gastrostomy tube placement for enteric nutrition supplementation purposes. EXAM: PULL TROUGH GASTROSTOMY TUBE PLACEMENT COMPARISON:  CT abdomen and pelvis-10/14/2022 MEDICATIONS: Ancef 2 gm IV; Antibiotics were administered within 1 hour of the procedure. Glucagon 1 mg IV CONTRAST:  20 cc  Omnipaque 300 administered into the gastric lumen. ANESTHESIA/SEDATION: Moderate (conscious) sedation was employed during this procedure. A total of Versed 0.5 mg and Fentanyl 25 mcg was administered intravenously. Moderate Sedation Time:  12 minutes. The patient's level of consciousness and vital signs were monitored continuously by radiology nursing throughout the procedure under my direct supervision. FLUOROSCOPY TIME:  1 minute, 24 seconds (8 mGy) COMPLICATIONS: None immediate. PROCEDURE: Informed written consent was obtained from the patient following explanation of the procedure, risks, benefits and alternatives. A time out was performed prior to the initiation of the procedure. Ultrasound scanning was performed to demarcate the edge of the left lobe of the liver. Maximal barrier sterile technique utilized including caps, mask, sterile gowns, sterile gloves, large sterile drape, hand hygiene and Betadine prep. The left upper quadrant was sterilely prepped and draped. An oral gastric catheter was inserted into the stomach under fluoroscopy. The existing nasogastric feeding tube was removed. The left costal margin and air opacified transverse colon were identified and avoided. Air was injected into the stomach for insufflation and visualization under fluoroscopy. Under sterile conditions a 17 gauge trocar needle was utilized to access the stomach percutaneously beneath the left subcostal margin after the overlying soft tissues were anesthetized with 1% Lidocaine with epinephrine. Needle position was confirmed within the stomach with aspiration of air and injection of small amount of contrast. A single T tack was deployed for gastropexy. Over an Amplatz guide wire, a 9-French sheath was inserted into the stomach. A snare device was utilized to capture the oral gastric catheter. The snare device was pulled retrograde from the stomach up the esophagus and out the oropharynx. The 20-French pull-through gastrostomy  was connected to the snare device and pulled antegrade through the oropharynx down the esophagus into the stomach and then through the percutaneous tract external to the patient. The gastrostomy was assembled externally. Contrast injection confirms appropriate positioning within the stomach. Several spot radiographic images were obtained in various obliquities for documentation. Dressings were applied. The patient tolerated procedure well without immediate post procedural complication. FINDINGS: After successful fluoroscopic guided placement, the gastrostomy tube is appropriately positioned with internal disc positioned against the inner ventral wall of the gastric lumen. IMPRESSION: Successful fluoroscopic insertion of a 20-French pull-through gastrostomy tube. The gastrostomy may be used immediately for medication administration and in 24 hrs for the initiation of feeds. Electronically Signed   By: Sandi Mariscal M.D.   On: 10/29/2022 15:28   DG Naso G Tube Plc W/Fl W/Rad  Result Date: 10/22/2022 CLINICAL DATA:  Feeding tube placement EXAM: NASO G TUBE PLACEMENT WITH FL AND WITH RAD CONTRAST:  None utilized FLUOROSCOPY: Fluoroscopy Time:  1 minute 6 seconds Radiation Exposure Index (if provided by the fluoroscopic device): 17.00 mGy Number of Acquired Spot Images: 1 COMPARISON:  None Available. FINDINGS: Attempted placement of feeding tube. Immediate epistaxis with introduction of the tube, potentially from attempts to place the tube yesterday; bleeding controlled by nasal pressure. Multiple times the feeding tube was placed into the trachea with a spontaneous cough reflex. Tube was removed. Despite repeated swallowing, the esophagus could not be intubated. Patient unable to flex chin to chest to aid in placement due to recent cervical spine surgery and presence of soft cervical collar. IMPRESSION: Unsuccessful attempts to place feeding tube. Procedure and epistaxis discussed with Dr. Wynetta Emery at conclusion of  procedure. Electronically Signed   By: Lavonia Dana M.D.   On: 10/22/2022 12:02   DG Swallowing Func-Speech Pathology  Result Date: 10/21/2022 Table formatting from the original result was not included. MBSS at [facility], inpatient:  10/21/22 1200 SLP Visit Information SLP Received On 10/21/22 General Information HPI KRISLYN DONNAN is a  88 y.o. 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  Patient was lying in bed with neck brace and most of the history was obtained from ED physician and niece at the bedside.  Per report, patient presented with cough and congestion a few days ago, her physician was contacted and recommended giving Mucinex, patient's aide called the niece this afternoon that patient was vomiting, niece activated EMS and on arrival of EMS team, she was noted to be hypoxic with an O2 sat of 84% on room air, NRB at 15 L was provided with improvement in oxygenation to 98%.  Patient was tachycardic at rate of 136, she was febrile with a temperature of 102F (orally).  Code sepsis was activated en route to the ED.;CXR indicated on 10/19/22 potential Aspiration PNA or B PNA.BSEcompleted this AM and MBSS recommended. Caregiver present No Diet Prior to this Study NPO Temperature  Normal Respiratory Status WFL Supplemental O2 Nasal cannula History of Recent Intubation Yes Length of Intubations (days) 1 days Date extubated 10/17/22 Number of Intubations 1 Self extubation  No Behavior/Cognition Alert;Cooperative Self-Feeding Abilities Able to self-feed Baseline vocal quality/speech Normal Volitional Cough Able to elicit Volitional Cough Assessment Appears WFL Volitional Swallow Able to elicit Anatomy Presence of cervical hardware (posterior  pharyngeal wall swelling s/p ACDF surgery on Friday) Orofacial Exam Oral Cavity Assessment WFL Oral Cavity - Dentition Adequate natural dentition Orofacial Anatomy WFL Oral Motor/Sensory Function WFL Thin Liquids (Level 0) Thin Liquids  Impaired Bolus delivery method Spoon Thin Liquid - Impairment Pharyngeal impairment Initiation of swallow  Valleculae Soft palate elevation Complete Tongue base retraction Wide column of contrast or air between tongue base and PPW Laryngeal elevation Complete superior movement of thyroid cartilage with complete approximation of arytenoids to epiglottic petiole Anterior hyoid excursion Complete Epiglottic movement Partial Laryngeal vestibule closure Incomplete Pharyngeal stripping wave  Present - diminished Pharyngeal contraction (A/P view only) N/A Pharyngoesophageal segment opening Partial distention/partial duration, partial obstruction of flow Pharyngeal residue Collection of residue within or on pharyngeal structures Location of pharyngeal residue Valleculae;Pharyngeal wall;Pyriform sinuses Penetration/Aspiration Scale (PAS) score 7.  Material enters airway, passes BELOW cords and not ejected out despite cough attempt by patient Mildly thick liquids (Level 2, nectar thick) Mildly thick liquids (Level 2, nectar thick) Impaired Bolus delivery method Spoon Mildly Thick Liquid - Impairment Pharyngeal impairment Initiation of swallow  Valleculae Soft palate elevation Complete Tongue base retraction Wide column of contrast or air between tongue base and PPW Laryngeal elevation Complete superior movement of thyroid cartilage with complete approximation of arytenoids to epiglottic petiole Anterior hyoid excursion Complete Epiglottic movement Partial Laryngeal vestibule closure Incomplete Pharyngeal stripping wave  Present - diminished Pharyngeal contraction (A/P view only) N/A Pharyngoesophageal segment opening Partial distention/partial duration, partial obstruction of flow Pharyngeal  residue Collection of residue within or on pharyngeal structures Location of pharyngeal residue Valleculae;Pharyngeal wall;Pyriform sinuses Penetration/Aspiration Scale (PAS) score 7.  Material enters airway, passes BELOW cords and not ejected out despite cough attempt by patient Moderately thick liquids (Level 3, honey thick) Moderately thick liquids (Level 3, honey thick) Impaired Moderately Thick Liquid - Impairment Pharyngeal impairment Initiation of swallow  Valleculae Soft palate elevation Complete Tongue base retraction Wide column of contrast or air between tongue base and PPW Laryngeal elevation Complete superior movement of thyroid cartilage  with complete approximation of arytenoids to epiglottic petiole Anterior hyoid excursion Complete Epiglottic movement Partial Laryngeal vestibule closure Incomplete Pharyngeal stripping wave  Present - diminished Pharyngeal contraction (A/P view only) N/A Pharyngoesophageal segment opening Partial distention/partial duration, partial obstruction of flow Pharyngeal residue Collection of residue within or on pharyngeal structures Location of pharyngeal residue Valleculae;Pharyngeal wall;Pyriform sinuses Penetration/Aspiration Scale (PAS) score 7.  Material enters airway, passes BELOW cords and not ejected out despite cough attempt by patient Puree Puree Impaired Puree - Impairment Pharyngeal impairment Initiation of swallow Valleculae Soft palate elevation Complete Tongue base retraction Wide column of contrast or air between tongue base and PPW Laryngeal elevation Complete superior movement of thyroid cartilage with complete approximation of arytenoids to epiglottic petiole Anterior hyoid excursion Complete Epiglottic movement Partial Laryngeal vestibule closure Incomplete Pharyngeal stripping wave  Present - diminished Pharyngeal contraction (A/P view only) N/A Pharyngoesophageal segment opening Partial distention/partial duration, partial obstruction of flow  Pharyngeal residue Collection of residue within or on pharyngeal structures;Majority of contrast within or on pharyngeal structures Location of pharyngeal residue Valleculae;Pharyngeal wall;Pyriform sinuses Penetration/Aspiration Scale (PAS) score 7.  Material enters airway, passes BELOW cords and not ejected out despite cough attempt by patient Solid Solid Not Tested Pill Pill Not Tested Compensatory Strategies Compensatory strategies Yes Effortful swallow Ineffective Multiple swallows Effective Effective Multiple Swallows Thin liquid (Level 0);Mildly thick liquid (Level 2, nectar thick);Moderately thick liquid (Level 3, honey thick);Puree Chin tuck Ineffective Left head turn Ineffective Right head turn Ineffective Clinical Impression Clinical Impression Pt presents with moderate/severe pharyngeal phase dysphagia characterized by vallecular trigger with tsp thin, NTL, HTL, and puree, posterior pharyngeal wall edema negatively impacting epiglottic deflection and pharyngeal constriction resulting in penetration during the swallow, aspiration after the swallow, and pharyngeal residue. Pt with episodes of gross aspiration after the swallow from the residuals which was sensed and mostly removed, but then unable to completely clear pharynx. Head turns left and right were attempted, but found to be ineffective. Pt with limited head movement due to soft neck brace and discomfort for chin down. Recommend NPO with alternative means of nutrition via NG in the hopes that swelling will decrease over the next 10-14 days. Pt has a very strong cough, however she is unable to completely clear all aspirates and then unable to completely clear pharynx at this time. Consider consultation with Dr. Trenton Gammon, who did the surgery to see if steroids could be beneficial during this stage of her recovery. SLP to follow and can administer single ice chips for comfort and to help prevent muscle disuse atrophy (SLP for puree trials to assess for  readiness for repeat instrumental assessment). RN to complete oral care and have suction at bedside. Pt was shown how to self use the [place] suction and encouraged to cough and clear. SLP will follow.  SLP Visit Diagnosis Dysphagia, pharyngeal phase (R13.13);Dysphagia, pharyngoesophageal phase (R13.14) Factors that may increase risk of adverse event in presence of aspiration ([place] 2021) Limited mobility;Frail or deconditioned;Frequent aspiration of large volumes Swallowing Evaluation Recommendations Recommendations NPO;Alternative means of nutrition - NG Tube Medication Administration Via alternative means Swallowing strategies   (trials puree with SLP only) Postural changes Position pt fully upright for meals;Stay upright 30-60 min after meals Oral care recommendations Oral care QID (4x/day);Staff/trained caregiver to provide oral care Recommended consults Consider dietitian consultation Treatment Plan Treatment recommendations Therapy as outlined in treatment plan below Follow-up recommendations Skilled nursing-short term rehab (<3 hours/day) Assistance recommended at discharge Frequent or constant supervision/assistance Functional status  assessment Patient has had a recent decline in their functional status and demonstrates the ability to make significant improvements in function in a reasonable and predictable amount of time. Treatment frequency Min 2x/week Treatment duration 1 week Interventions Aspiration precaution training;Trials of upgraded texture/liquids;Patient/family education Goal Planning Prognosis for Safe Diet Advancement Fair Barriers to Reach Goals Severity of deficits Patient/Family Stated Goal Swallow safely Consulted and agree with results and recommendations Patient;Physician SLP Time Calculation SLP Start Time (ACUTE ONLY) 1151 SLP Stop Time (ACUTE ONLY) 1223 SLP Time Calculation (min) (ACUTE ONLY) 32 min SLP Evaluations $ SLP Speech Visit 1 Visit SLP Evaluations $MBS Swallow 1  Procedure Thank you, Genene Churn, [place]   [facility] Chest Port 1V same Day  Result Date: 10/21/2022 CLINICAL DATA:  Follow-up malpositioned NG tube. EXAM: PORTABLE CHEST 1 VIEW COMPARISON:  Earlier chest x-ray, same day. FINDINGS: The NG tube has been removed. No evidence of a right-sided pneumothorax. The left PICC line is stable. Persistent bibasilar atelectasis or infiltrates and small left pleural effusion. IMPRESSION: 1. Removal of malpositioned NG tube.  No right-sided pneumothorax. 2. Persistent bibasilar atelectasis or infiltrates and small left pleural effusion. Electronically Signed   By: Marijo Sanes M.D.   On: 10/21/2022 14:33   DG CHEST PORT 1 VIEW  Addendum Date: 10/21/2022   ADDENDUM REPORT: 10/21/2022 14:09 ADDENDUM: The original report was by Dr. Van Clines. The following addendum is by Dr. Van Clines: Critical Value/emergent results were called by telephone at the time of interpretation on 10/21/2022 at 2:05 pm to provider [facility] , who verbally acknowledged these results. Electronically Signed   By: Van Clines M.D.   On: 10/21/2022 14:09   Result Date: 10/21/2022 CLINICAL DATA:  Nasogastric tube placement. EXAM: PORTABLE CHEST 1 VIEW COMPARISON:  10/19/2022 FINDINGS: The nasogastric tube tip projects over the right lower lobe medially, and is fairly peripheral in the right lung. This tube is malposition. There is some continued airspace opacities in both lung bases along with blunting of the left costophrenic angle. Borderline enlargement of the cardiopericardial silhouette. Atherosclerotic calcification of the aortic arch. No current pneumothorax. Dextroconvex thoracolumbar scoliosis. Left upper quadrant density likely from contrast medium in the stomach. Left-sided PICC line tip: Cavoatrial junction. IMPRESSION: 1. Malpositioned nasogastric tube with the tip projecting peripherally over the right lower lobe medially. No currently visible  pneumothorax; removal recommended with immediate post removal chest radiograph to rule out post removal pneumothorax prior to attempts at replacement. Close clinical observation of the patient is recommended following removal of the tube until chest radiography can confirm the lack of a post-removal pneumothorax. 2. Continued airspace opacities in both lung bases with blunting of the left costophrenic angle. 3. Borderline enlargement of the cardiopericardial silhouette. 4. Left upper quadrant density likely from contrast medium in the stomach. 5. Dextroconvex thoracolumbar scoliosis. Radiology assistant personnel have been notified to put me in telephone contact with the referring physician or the referring physician's clinical representative in order to discuss these findings. Once this communication is established I will issue an addendum to this report for documentation purposes. Electronically Signed: By: Van Clines M.D. On: 10/21/2022 14:03   [name] EKG SITE RITE  Result Date: 10/20/2022 If Site Rite image not attached, placement could not be confirmed due to current cardiac rhythm.  DG Chest Port 1 View  Result Date: 10/19/2022 CLINICAL DATA:  Questionable sepsis. EXAM: PORTABLE CHEST 1 VIEW COMPARISON:  PA Lat 05/16/2022 FINDINGS: There is mild cardiomegaly without evidence of CHF.  Mild aortic tortuosity and calcification with stable mediastinum. Since the prior study there has developed patchy airspace disease in both lung bases consistent with bilateral pneumonia or aspiration. Minimal pleural effusions have also developed. The mid and upper lung fields are clear. There are degenerative changes of the thoracic spine and old right axillary surgical clips. IMPRESSION: 1. New patchy airspace disease in both lung bases consistent with bilateral pneumonia or aspiration. Minimal pleural effusions. Follow-up study recommended after treatment to ensure clearing. 2. Mild cardiomegaly without evidence of  CHF. 3. Aortic atherosclerosis. Electronically Signed   By: Telford Nab M.D.   On: 10/19/2022 21:35   DG Cervical Spine 2 or 3 views  Result Date: 10/17/2022 CLINICAL DATA:  Fluoroscopic assistance for cervical spinal fusion EXAM: CERVICAL SPINE - 2-3 VIEW COMPARISON:  06/01/2022 FINDINGS: Fluoroscopic images show anterior surgical fusion from C3-C5 levels. Fluoroscopy time 7.3 seconds. Radiation dose 0.61 mGy. IMPRESSION: Fluoroscopic assistance was provided for anterior surgical fusion at C3-C4 and C4-C5 levels. Electronically Signed   By: Elmer Picker M.D.   On: 10/17/2022 18:38   DG C-Arm 1-60 Min-No Report  Result Date: 10/17/2022 Fluoroscopy was utilized by the requesting physician.  No radiographic interpretation.   DG C-Arm 1-60 Min-No Report  Result Date: 10/17/2022 Fluoroscopy was utilized by the requesting physician.  No radiographic interpretation.   CT ABDOMEN PELVIS WO CONTRAST  Result Date: 10/16/2022 CLINICAL DATA:  88 year old female presents with history of bladder cancer for follow-up. * Tracking Code: BO * EXAM: CT ABDOMEN AND PELVIS WITHOUT CONTRAST TECHNIQUE: Multidetector CT imaging of the abdomen and pelvis was performed following the standard protocol without IV contrast. RADIATION DOSE REDUCTION: This exam was performed according to the departmental dose-optimization program which includes automated exposure control, adjustment of the mA and/or kV according to patient size and/or use of iterative reconstruction technique. COMPARISON:  January 24, 2022. FINDINGS: Lower chest: Basilar atelectasis. No effusion. No consolidative changes. Hepatobiliary: Smooth hepatic contours. Post cholecystectomy. No gross biliary duct distension. No visible lesion on noncontrast imaging. Pancreas: Limited assessment due to lack of intravenous contrast. Fatty atrophy favoring the head of the pancreas. Spleen: Normal. Adrenals/Urinary Tract: Adrenal glands are normal. There is new  fullness of LEFT ureter and intrarenal collecting systems since prior imaging with mild LEFT hydronephrosis. No perinephric stranding. No gross calculus in the distal LEFT ureter. Transition does occur at the LEFT bladder base however. This areas obscured by streak artifact from hip arthroplasty changes. Large staghorn calculus in the interpolar and lower pole or RIGHT intrarenal collecting system similar to prior imaging. Enlarging intermediate density lesion arises from the lower pole the RIGHT kidney measuring 59 Hounsfield units at 1.5 cm previously 1.2 cm in May of 2023. No gross stranding adjacent urinary bladder. Stomach/Bowel: No acute gastrointestinal findings. Signs of prior bowel resection with ileocolonic anastomosis. Dilute contrast passes into the colon with patulous appearance of the proximal colon. Vascular/Lymphatic: Aortic atherosclerosis. No sign of aneurysm. Smooth contour of the IVC. There is no gastrohepatic or hepatoduodenal ligament lymphadenopathy. No retroperitoneal or mesenteric lymphadenopathy. No pelvic sidewall lymphadenopathy. Limited assessment of vascular structures due to lack of intravenous contrast. Reproductive: Cystic RIGHT adnexal lesion stable 4 cm greatest axial dimension only minimally changed since imaging from 2017. Other: No ascites.  No pneumoperitoneum. Musculoskeletal: RIGHT hip arthroplasty, incompletely imaged. Osteopenia. Spinal degenerative changes. IMPRESSION: 1. New mild LEFT hydroureteronephrosis in this patient with history of prior bladder neoplasm. Stricture related to therapy or lesion are considered as causes in this  patient with history of cancer near the trigone. Cystoscopic assessment may be warranted if not recently performed. 2. Large staghorn calculus of the RIGHT kidney. 3. RIGHT renal lesion with intermediate density more likely hemorrhagic cyst based on simple appearance seen on prior imaging studies. Consider follow-up renal sonogram. 4. Cystic  RIGHT adnexal lesion stable 4 cm greatest axial dimension only minimally changed since imaging from 2017. Recommend follow-up US in 6-12 months. Note: This recommendation does not apply to premenarchal patients and to those with increased risk (genetic, family history, elevated tumor markers or other high-risk factors) of ovarian cancer. Reference: JACR 2020 Feb; 17(2):248-254 5. Aortic atherosclerosis. Aortic Atherosclerosis (ICD10-I70.0). Electronically Signed   By: Zetta Bills M.D.   On: 10/16/2022 14:05    Orson Eva, DO  [facility]  If 7PM-7AM, please contact night-coverage [email or web address] Password [email or web address] 10/30/2022, 2:13 PM   LOS: 11 days

## 2022-10-31 DIAGNOSIS — Z9181 History of falling: Secondary | ICD-10-CM | POA: Diagnosis not present

## 2022-10-31 DIAGNOSIS — J9601 Acute respiratory failure with hypoxia: Secondary | ICD-10-CM | POA: Diagnosis not present

## 2022-10-31 DIAGNOSIS — K922 Gastrointestinal hemorrhage, unspecified: Secondary | ICD-10-CM | POA: Diagnosis not present

## 2022-10-31 DIAGNOSIS — N1330 Unspecified hydronephrosis: Secondary | ICD-10-CM | POA: Diagnosis not present

## 2022-10-31 DIAGNOSIS — K529 Noninfective gastroenteritis and colitis, unspecified: Secondary | ICD-10-CM | POA: Diagnosis not present

## 2022-10-31 DIAGNOSIS — I10 Essential (primary) hypertension: Secondary | ICD-10-CM | POA: Diagnosis not present

## 2022-10-31 DIAGNOSIS — Z741 Need for assistance with personal care: Secondary | ICD-10-CM | POA: Diagnosis not present

## 2022-10-31 DIAGNOSIS — I480 Paroxysmal atrial fibrillation: Secondary | ICD-10-CM | POA: Diagnosis not present

## 2022-10-31 DIAGNOSIS — I4891 Unspecified atrial fibrillation: Secondary | ICD-10-CM | POA: Diagnosis present

## 2022-10-31 DIAGNOSIS — N1832 Chronic kidney disease, stage 3b: Secondary | ICD-10-CM | POA: Diagnosis not present

## 2022-10-31 DIAGNOSIS — R531 Weakness: Secondary | ICD-10-CM | POA: Diagnosis not present

## 2022-10-31 DIAGNOSIS — R1310 Dysphagia, unspecified: Secondary | ICD-10-CM | POA: Diagnosis not present

## 2022-10-31 DIAGNOSIS — M4802 Spinal stenosis, cervical region: Secondary | ICD-10-CM | POA: Diagnosis not present

## 2022-10-31 DIAGNOSIS — W19XXXA Unspecified fall, initial encounter: Secondary | ICD-10-CM | POA: Diagnosis not present

## 2022-10-31 DIAGNOSIS — L89152 Pressure ulcer of sacral region, stage 2: Secondary | ICD-10-CM | POA: Diagnosis present

## 2022-10-31 DIAGNOSIS — T50905A Adverse effect of unspecified drugs, medicaments and biological substances, initial encounter: Secondary | ICD-10-CM | POA: Diagnosis not present

## 2022-10-31 DIAGNOSIS — M25522 Pain in left elbow: Secondary | ICD-10-CM | POA: Diagnosis not present

## 2022-10-31 DIAGNOSIS — Z515 Encounter for palliative care: Secondary | ICD-10-CM | POA: Diagnosis not present

## 2022-10-31 DIAGNOSIS — I132 Hypertensive heart and chronic kidney disease with heart failure and with stage 5 chronic kidney disease, or end stage renal disease: Secondary | ICD-10-CM | POA: Diagnosis not present

## 2022-10-31 DIAGNOSIS — K90829 Short bowel syndrome, unspecified: Secondary | ICD-10-CM | POA: Diagnosis present

## 2022-10-31 DIAGNOSIS — R52 Pain, unspecified: Secondary | ICD-10-CM | POA: Diagnosis not present

## 2022-10-31 DIAGNOSIS — Z87440 Personal history of urinary (tract) infections: Secondary | ICD-10-CM | POA: Diagnosis not present

## 2022-10-31 DIAGNOSIS — F32A Depression, unspecified: Secondary | ICD-10-CM | POA: Diagnosis present

## 2022-10-31 DIAGNOSIS — E43 Unspecified severe protein-calorie malnutrition: Secondary | ICD-10-CM

## 2022-10-31 DIAGNOSIS — E871 Hypo-osmolality and hyponatremia: Secondary | ICD-10-CM | POA: Diagnosis not present

## 2022-10-31 DIAGNOSIS — J690 Pneumonitis due to inhalation of food and vomit: Secondary | ICD-10-CM | POA: Diagnosis not present

## 2022-10-31 DIAGNOSIS — E271 Primary adrenocortical insufficiency: Secondary | ICD-10-CM | POA: Diagnosis not present

## 2022-10-31 DIAGNOSIS — I350 Nonrheumatic aortic (valve) stenosis: Secondary | ICD-10-CM | POA: Diagnosis present

## 2022-10-31 DIAGNOSIS — K909 Intestinal malabsorption, unspecified: Secondary | ICD-10-CM | POA: Diagnosis not present

## 2022-10-31 DIAGNOSIS — R278 Other lack of coordination: Secondary | ICD-10-CM | POA: Diagnosis not present

## 2022-10-31 DIAGNOSIS — R488 Other symbolic dysfunctions: Secondary | ICD-10-CM | POA: Diagnosis not present

## 2022-10-31 DIAGNOSIS — I129 Hypertensive chronic kidney disease with stage 1 through stage 4 chronic kidney disease, or unspecified chronic kidney disease: Secondary | ICD-10-CM | POA: Diagnosis present

## 2022-10-31 DIAGNOSIS — M6281 Muscle weakness (generalized): Secondary | ICD-10-CM | POA: Diagnosis not present

## 2022-10-31 DIAGNOSIS — Z6823 Body mass index (BMI) 23.0-23.9, adult: Secondary | ICD-10-CM | POA: Diagnosis not present

## 2022-10-31 DIAGNOSIS — Y838 Other surgical procedures as the cause of abnormal reaction of the patient, or of later complication, without mention of misadventure at the time of the procedure: Secondary | ICD-10-CM | POA: Diagnosis not present

## 2022-10-31 DIAGNOSIS — C679 Malignant neoplasm of bladder, unspecified: Secondary | ICD-10-CM | POA: Diagnosis not present

## 2022-10-31 DIAGNOSIS — Z23 Encounter for immunization: Secondary | ICD-10-CM | POA: Diagnosis not present

## 2022-10-31 DIAGNOSIS — Z7189 Other specified counseling: Secondary | ICD-10-CM | POA: Diagnosis not present

## 2022-10-31 DIAGNOSIS — K219 Gastro-esophageal reflux disease without esophagitis: Secondary | ICD-10-CM | POA: Diagnosis not present

## 2022-10-31 DIAGNOSIS — M25512 Pain in left shoulder: Secondary | ICD-10-CM | POA: Diagnosis not present

## 2022-10-31 DIAGNOSIS — N179 Acute kidney failure, unspecified: Secondary | ICD-10-CM | POA: Diagnosis not present

## 2022-10-31 DIAGNOSIS — Z431 Encounter for attention to gastrostomy: Secondary | ICD-10-CM | POA: Diagnosis not present

## 2022-10-31 DIAGNOSIS — K90821 Short bowel syndrome with colon in continuity: Secondary | ICD-10-CM | POA: Diagnosis not present

## 2022-10-31 DIAGNOSIS — E869 Volume depletion, unspecified: Secondary | ICD-10-CM | POA: Diagnosis present

## 2022-10-31 DIAGNOSIS — E875 Hyperkalemia: Secondary | ICD-10-CM | POA: Diagnosis not present

## 2022-10-31 DIAGNOSIS — R739 Hyperglycemia, unspecified: Secondary | ICD-10-CM | POA: Diagnosis not present

## 2022-10-31 DIAGNOSIS — N184 Chronic kidney disease, stage 4 (severe): Secondary | ICD-10-CM | POA: Diagnosis not present

## 2022-10-31 DIAGNOSIS — F411 Generalized anxiety disorder: Secondary | ICD-10-CM | POA: Diagnosis not present

## 2022-10-31 DIAGNOSIS — R1314 Dysphagia, pharyngoesophageal phase: Secondary | ICD-10-CM | POA: Diagnosis not present

## 2022-10-31 DIAGNOSIS — I5032 Chronic diastolic (congestive) heart failure: Secondary | ICD-10-CM | POA: Diagnosis not present

## 2022-10-31 DIAGNOSIS — C670 Malignant neoplasm of trigone of bladder: Secondary | ICD-10-CM | POA: Diagnosis not present

## 2022-10-31 DIAGNOSIS — J189 Pneumonia, unspecified organism: Secondary | ICD-10-CM | POA: Diagnosis not present

## 2022-10-31 DIAGNOSIS — G959 Disease of spinal cord, unspecified: Secondary | ICD-10-CM | POA: Diagnosis not present

## 2022-10-31 DIAGNOSIS — J9611 Chronic respiratory failure with hypoxia: Secondary | ICD-10-CM | POA: Diagnosis not present

## 2022-10-31 DIAGNOSIS — R770 Abnormality of albumin: Secondary | ICD-10-CM | POA: Diagnosis not present

## 2022-10-31 DIAGNOSIS — N200 Calculus of kidney: Secondary | ICD-10-CM | POA: Diagnosis not present

## 2022-10-31 DIAGNOSIS — D696 Thrombocytopenia, unspecified: Secondary | ICD-10-CM | POA: Diagnosis not present

## 2022-10-31 DIAGNOSIS — S14129S Central cord syndrome at unspecified level of cervical spinal cord, sequela: Secondary | ICD-10-CM | POA: Diagnosis not present

## 2022-10-31 DIAGNOSIS — E46 Unspecified protein-calorie malnutrition: Secondary | ICD-10-CM | POA: Diagnosis not present

## 2022-10-31 DIAGNOSIS — E872 Acidosis, unspecified: Secondary | ICD-10-CM | POA: Diagnosis not present

## 2022-10-31 DIAGNOSIS — D649 Anemia, unspecified: Secondary | ICD-10-CM | POA: Diagnosis present

## 2022-10-31 DIAGNOSIS — D509 Iron deficiency anemia, unspecified: Secondary | ICD-10-CM | POA: Diagnosis not present

## 2022-10-31 DIAGNOSIS — K912 Postsurgical malabsorption, not elsewhere classified: Secondary | ICD-10-CM | POA: Diagnosis not present

## 2022-10-31 DIAGNOSIS — E8809 Other disorders of plasma-protein metabolism, not elsewhere classified: Secondary | ICD-10-CM | POA: Diagnosis not present

## 2022-10-31 DIAGNOSIS — R262 Difficulty in walking, not elsewhere classified: Secondary | ICD-10-CM | POA: Diagnosis not present

## 2022-10-31 DIAGNOSIS — Z66 Do not resuscitate: Secondary | ICD-10-CM | POA: Diagnosis present

## 2022-10-31 DIAGNOSIS — F419 Anxiety disorder, unspecified: Secondary | ICD-10-CM | POA: Diagnosis present

## 2022-10-31 DIAGNOSIS — R635 Abnormal weight gain: Secondary | ICD-10-CM | POA: Diagnosis not present

## 2022-10-31 DIAGNOSIS — Z452 Encounter for adjustment and management of vascular access device: Secondary | ICD-10-CM | POA: Diagnosis not present

## 2022-10-31 DIAGNOSIS — E039 Hypothyroidism, unspecified: Secondary | ICD-10-CM | POA: Diagnosis not present

## 2022-10-31 DIAGNOSIS — R600 Localized edema: Secondary | ICD-10-CM | POA: Diagnosis not present

## 2022-10-31 DIAGNOSIS — I959 Hypotension, unspecified: Secondary | ICD-10-CM | POA: Diagnosis present

## 2022-10-31 DIAGNOSIS — R197 Diarrhea, unspecified: Secondary | ICD-10-CM | POA: Diagnosis not present

## 2022-10-31 LAB — GLUCOSE, CAPILLARY
Glucose-Capillary: 117 mg/dL — ABNORMAL HIGH (ref 70–99)
Glucose-Capillary: 125 mg/dL — ABNORMAL HIGH (ref 70–99)
Glucose-Capillary: 108 mg/dL — ABNORMAL HIGH (ref 70–99)

## 2022-10-31 LAB — SARS CORONAVIRUS 2 BY RT PCR: SARS Coronavirus 2 by RT PCR: NEGATIVE

## 2022-10-31 MED ORDER — HYDROCODONE-ACETAMINOPHEN 5-325 MG PO TABS: 1.0000 | ORAL_TABLET | ORAL | 0 refills | Status: DC | PRN

## 2022-10-31 MED ORDER — GABAPENTIN 250 MG/5ML PO SOLN: 200.0000 mg | Freq: Every day | ORAL | 12 refills | Status: DC

## 2022-10-31 MED ORDER — FAMOTIDINE 20 MG PO TABS: 20.0000 mg | ORAL_TABLET | Freq: Every day | ORAL | Status: DC

## 2022-10-31 MED ORDER — METOPROLOL TARTRATE 75 MG PO TABS: 75.0000 mg | ORAL_TABLET | Freq: Two times a day (BID) | ORAL | Status: DC

## 2022-10-31 MED ORDER — GABAPENTIN 250 MG/5ML PO SOLN: 100.0000 mg | Freq: Two times a day (BID) | ORAL | 12 refills | Status: DC

## 2022-10-31 MED ORDER — PROSOURCE TF20 ENFIT COMPATIBL EN LIQD
60.0000 mL | Freq: Every day | ENTERAL | Status: DC
  Administered 2022-10-31: 60 mL
  Filled 2022-10-31: qty 60

## 2022-10-31 MED ORDER — JEVITY 1.2 CAL PO LIQD: 1000.0000 mL | ORAL | 0 refills | Status: DC

## 2022-10-31 MED ORDER — ALPRAZOLAM 0.5 MG PO TABS: 1.0000 mg | ORAL_TABLET | Freq: Every day | ORAL | 0 refills | Status: DC

## 2022-10-31 MED ORDER — AMLODIPINE BESYLATE 10 MG PO TABS: 10.0000 mg | ORAL_TABLET | Freq: Every day | ORAL | Status: DC

## 2022-10-31 MED ORDER — PROSOURCE TF20 ENFIT COMPATIBL EN LIQD: 60.0000 mL | Freq: Every day | ENTERAL | Status: DC

## 2022-10-31 MED ORDER — DEXAMETHASONE 2 MG PO TABS: 2.0000 mg | ORAL_TABLET | Freq: Two times a day (BID) | ORAL | Status: DC

## 2022-10-31 NOTE — Discharge Summary (Signed)
Physician Discharge Summary   Patient: Jane Andrews MRN: [id number] DOB: Oct 13, 1925  Admit date:     10/19/2022  Discharge date: 10/31/22  Discharge Physician: Shanon Brow Mikell Camp   PCP: Redmond School, MD   Recommendations at discharge:   Please follow up with primary care provider within 1-2 weeks  Please repeat BMP and CBC in one week Run TF at 45 cc/hr on 10/31/22 and increase to 55 cc/hr on 11/01/22 am   Discharge Diagnoses: Principal Problem:   CAP (community acquired pneumonia) Active Problems:   CKD (chronic kidney disease) stage 4, GFR 15-29 ml/min (HCC)   Acquired hypothyroidism   Atrial fibrillation ([place])   Aspiration pneumonitis (HCC)   Normocytic anemia   Acute respiratory failure with hypoxia ([place])   Overactive bladder   Hypoalbuminemia due to protein-calorie malnutrition (HCC)   Anxiety   Depression   Cervical stenosis of spinal canal   Protein-calorie malnutrition, severe   Chronic kidney disease, stage 3b ([place])  Resolved Problems:   * No resolved hospital problems. [facility] Course: 88 y.o. 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  Patient was laying in bed with neck brace and most of the history was obtained from ED physician and niece at the bedside.  Per report, patient presented with cough and congestion a few days ago, her physician was contacted and recommended giving Mucinex, patient's aide called the niece this afternoon that patient was vomiting, niece activated EMS and on arrival of EMS team, she was noted to be hypoxic with an O2 sat of 84% on room air, NRB at 15 L was provided with improvement in oxygenation to 98%.  Patient was tachycardic at rate of 136, she was febrile with a temperature of 102F  (orally).  Code sepsis was activated en route to the ED.   ED Course:  In the emergency department, temperature was 98.6 F, respiration was 25/min, HR 138 bpm, BP was 110/65, O2 sat was 94% on supplemental oxygen at 2 LPM.  Work-up in the ED showed normocytic anemia with WBC of 11.4, BMP showed sodium 136, potassium 4.3, chloride 107, bicarb 20, glucose 116, BUN 30, creatinine 1.88 (baseline creatinine at 1.5-1.7).  Albumin 3.0, AST 42, ALT 14, ALP 117.  Lactic acid was 1.8 > 1.4.  Blood culture pending, influenza A, B, SARS coronavirus 2 was negative.  Chest x-ray showed new  patchy airspace disease in both lung bases consistent with bilateral pneumonia or aspiration. Minimal pleural effusions. He was treated with IV ceftriaxone and azithromycin, IV hydration was provided.  Hospitalist was asked to admit patient for further evaluation and management.    Assessment and Plan:  Acute Respiratory Failure with Hypoxia/ Aspiration pneumonia -now stable on RA - IV Unasyn course completed 11/9 - supportive measures   Dysphagia  - severe - postop complication of ACDF - discussed with neurosurgeon Dr. Trenton Gammon - he recommended IV decadron 4 mg IV Q6h which we did for 5 days but no meaningful improvement in her ability to swallow now weaning down decadron - initially felt patient should improve in 3-5 days, then can rechallenge orally, unfortunately this did not work and she will need to remain NPO. - continue NPO status for now -SLP following and will plan on repeat MBSS (Monday) 11/13 -->failed study, ongoing aspiration seen,  recommended alternative means for nutrition  -Discussed with surgery, unable to pass scope due to severe edema; consulted to IR for image guided G tube placement  -IR made arrangements for transfer to [facility] on 11/15 in AM for image guided G tube placement  -TNA started 10/28/22 per pharmD, thru PICC line, hopefully if G tube placement successful hopefully we can stop TNA and begin  enteral feeding.   -gastrostomy tube placed 11/15 -started enteral feeding 11/16 -rate increased to 45 cc/hr on 11/17 -increase rate to 55 cc/hr on 11/18   Stage 3b CKD - stable, following  -baseline creatinine 1.2-1.5   Persistent Atrial Fibrillation  - unable to take oral metoprolol due to dyphagia - continue IV lopressor 12.5 mg Q6h>>po metoprolol per tube - per cardiologist, not a candidate for anticoagulation   Hypothyroidism  - I have asked the pharmacy if she can have a dose of IV levothyroxine as she has been without oral dose for 1 week now - can give dose today levothyroxine 37.5 mcg x 1 dose 11/12, repeat dose 11/14 -restart po levothyroxine per tube 11/16   Overactive Bladder - unable to resume home myrbetriq until she can take p.o. again - stable without myrbetriq   Cervical stenosis s/p ACDF - discussed with neurosurgeon Dr. Trenton Gammon on 11/8 - he recommended starting IV decadron 4 mg every 6 hours on 11/8, reducing dose to Q12 hours on 11/12, he felt the edema was an expected complication of the surgery and could improve in 3-5 days however we have not seen that improvement - continue neck brace and plan for outpatient follow up with Dr. Trenton Gammon for wound check - IV pain management until able to take p.o. >>po norco   Pyuria - asymptomatic, DC IV fluconazole    Hypertension - uncontrolled - persistently elevated BP likely elevated partly due to IV steroids which we are weaning down   - Patient with SBP consistently in upper 160's. Likely secondary to pain  -IV Hydralazine ordered and Metoprolol increased to 12.5 mg q6, increased IV hydralazine in attempt to improve blood pressures -Continue to monitor -11/16--started po amlodipine and po metoprolol   Hyperglycemia - Steroid induced  Secondary to IV Decadron. Continue SSI coverage and CBG monitoring BS coming down as decadron dose coming down      Depression / Adjustment Disorder - Pt has been unable to take her  antidepressives for over a week now due to severe dysphagia - plan to restart cymbalta when able to use PEG - we did give her an IV dose of levothyroxine on 11/12, 11/14           Pain control - [facility] Controlled Substance Reporting System database was reviewed. and patient was instructed, not to drive, operate heavy machinery, perform activities at heights, swimming or participation in water activities or provide baby-sitting services while on Pain, Sleep and Anxiety Medications; until their outpatient Physician has advised to do so again. Also recommended to not to take more than prescribed Pain, Sleep and Anxiety Medications.  Consultants: IR, general surgery Procedures performed: none  Disposition: Skilled nursing facility Diet recommendation:  Enteral feeding with Jevity 1.2 with rate of 55 cc/hr starting 11/18 DISCHARGE MEDICATION: Allergies as of 10/31/2022       Reactions   Codeine Nausea And Vomiting        Medication List     STOP taking these medications    fosfomycin 3 g Pack Commonly known as: MONUROL   gabapentin 100 MG  capsule Commonly known as: NEURONTIN Replaced by: gabapentin 250 MG/5ML solution   metoprolol succinate 25 MG 24 hr tablet Commonly known as: TOPROL-XL       TAKE these medications    acetaminophen 500 MG tablet Commonly known as: TYLENOL Take 1 tablet (500 mg total) by mouth 4 (four) times daily -  with meals and at bedtime. What changed:  when to take this reasons to take this   ALPRAZolam 0.5 MG tablet Commonly known as: XANAX Take 2 tablets (1 mg total) by mouth at bedtime.   amLODipine 10 MG tablet Commonly known as: NORVASC Place 1 tablet (10 mg total) into feeding tube daily.   dexamethasone 2 MG tablet Commonly known as: DECADRON Place 1 tablet (2 mg total) into feeding tube every 12 (twelve) hours.   DULoxetine 30 MG capsule Commonly known as: CYMBALTA Take 30 mg by mouth every morning. Take with a 60 mg  capsule for a total of 90 mg daily   DULoxetine 60 MG capsule Commonly known as: CYMBALTA Take 60 mg by mouth every morning. T   famotidine 20 MG tablet Commonly known as: PEPCID Place 1 tablet (20 mg total) into feeding tube daily.   feeding supplement (JEVITY 1.2 CAL) Liqd Place 1,000 mLs into feeding tube continuous. Run at 55 cc/hr starting AM 11/01/22   feeding supplement (PROSource TF20) liquid Place 60 mLs into feeding tube daily.   gabapentin 250 MG/5ML solution Commonly known as: NEURONTIN Place 2 mLs (100 mg total) into feeding tube 2 (two) times daily at 10 am and 4 pm. Replaces: gabapentin 100 MG capsule   gabapentin 250 MG/5ML solution Commonly known as: NEURONTIN Place 4 mLs (200 mg total) into feeding tube at bedtime.   HYDROcodone-acetaminophen 5-325 MG tablet Commonly known as: NORCO/VICODIN Take 1 tablet by mouth every 4 (four) hours as needed for moderate pain ((score 4 to 6)).   levothyroxine 75 MCG tablet Commonly known as: SYNTHROID Take 1 tablet (75 mcg total) by mouth daily before breakfast.   loperamide 2 MG capsule Commonly known as: IMODIUM TAKE ONE CAPSULE ('2MG'$  TOTAL) BY MOUTH FOUR TIMES DAILY AS NEEDED FOR DIARRHEA OR LOOSE STOOLS What changed: See the new instructions.   methocarbamol 750 MG tablet Commonly known as: ROBAXIN Take 1 tablet (750 mg total) by mouth 4 (four) times daily. What changed:  when to take this reasons to take this   Metoprolol Tartrate 75 MG Tabs Place 75 mg into feeding tube 2 (two) times daily.   mirabegron ER 50 MG Tb24 tablet Commonly known as: MYRBETRIQ Take 1 tablet (50 mg total) by mouth daily.   multivitamins with iron Tabs tablet Take 1 tablet by mouth every morning.   sodium bicarbonate 650 MG tablet Take 650 mg by mouth 2 (two) times daily.   Uribel 118 MG Caps Take 1 capsule (118 mg total) by mouth 4 (four) times daily as needed (pain with urination).        Contact information for  after-discharge care     [facility] .   Service: Skilled Nursing Contact information: [address] [place] [phone]                    Discharge Exam: Filed Weights   10/19/22 2058 10/19/22 2137 10/28/22 1100  Weight: 50.3 kg 50.8 kg 53.1 kg   HEENT:  [place]/AT, No thrush, no icterus CV:  RRR, no rub,  no S3, no S4 Lung:  bibasilar crackles. No wheeze Abd:  soft/+BS, NT Ext:  No edema, no lymphangitis, no synovitis, no rash   Condition at discharge: stable  The results of significant diagnostics from this hospitalization (including imaging, microbiology, ancillary and laboratory) are listed below for reference.   Imaging Studies: IR GASTROSTOMY TUBE MOD SED  Result Date: 10/29/2022 INDICATION: Recent neck surgery with persistent postoperative dysphagia. Please perform percutaneous gastrostomy tube placement for enteric nutrition supplementation purposes. EXAM: PULL TROUGH GASTROSTOMY TUBE PLACEMENT COMPARISON:  CT abdomen and pelvis-10/14/2022 MEDICATIONS: Ancef 2 gm IV; Antibiotics were administered within 1 hour of the procedure. Glucagon 1 mg IV CONTRAST:  20 cc Omnipaque 300 administered into the gastric lumen. ANESTHESIA/SEDATION: Moderate (conscious) sedation was employed during this procedure. A total of Versed 0.5 mg and Fentanyl 25 mcg was administered intravenously. Moderate Sedation Time: 12 minutes. The patient's level of consciousness and vital signs were monitored continuously by radiology nursing throughout the procedure under my direct supervision. FLUOROSCOPY TIME:  1 minute, 24 seconds (8 mGy) COMPLICATIONS: None immediate. PROCEDURE: Informed written consent was obtained from the patient following explanation of the procedure, risks, benefits and alternatives. A time out was performed prior to the initiation of the procedure. Ultrasound scanning was performed to demarcate the edge of  the left lobe of the liver. Maximal barrier sterile technique utilized including caps, mask, sterile gowns, sterile gloves, large sterile drape, hand hygiene and Betadine prep. The left upper quadrant was sterilely prepped and draped. An oral gastric catheter was inserted into the stomach under fluoroscopy. The existing nasogastric feeding tube was removed. The left costal margin and air opacified transverse colon were identified and avoided. Air was injected into the stomach for insufflation and visualization under fluoroscopy. Under sterile conditions a 17 gauge trocar needle was utilized to access the stomach percutaneously beneath the left subcostal margin after the overlying soft tissues were anesthetized with 1% Lidocaine with epinephrine. Needle position was confirmed within the stomach with aspiration of air and injection of small amount of contrast. A single T tack was deployed for gastropexy. Over an Amplatz guide wire, a 9-French sheath was inserted into the stomach. A snare device was utilized to capture the oral gastric catheter. The snare device was pulled retrograde from the stomach up the esophagus and out the oropharynx. The 20-French pull-through gastrostomy was connected to the snare device and pulled antegrade through the oropharynx down the esophagus into the stomach and then through the percutaneous tract external to the patient. The gastrostomy was assembled externally. Contrast injection confirms appropriate positioning within the stomach. Several spot radiographic images were obtained in various obliquities for documentation. Dressings were applied. The patient tolerated procedure well without immediate post procedural complication. FINDINGS: After successful fluoroscopic guided placement, the gastrostomy tube is appropriately positioned with internal disc positioned against the inner ventral wall of the gastric lumen. IMPRESSION: Successful fluoroscopic insertion of a 20-French pull-through  gastrostomy tube. The gastrostomy may be used immediately for medication administration and in 24 hrs for the initiation of feeds. Electronically Signed   By: Sandi Mariscal M.D.   On: 10/29/2022 15:28   DG Naso G Tube Plc W/Fl W/Rad  Result Date: 10/22/2022 CLINICAL DATA:  Feeding tube placement EXAM: NASO G TUBE PLACEMENT WITH FL AND WITH RAD CONTRAST:  None utilized FLUOROSCOPY: Fluoroscopy Time:  1 minute 6 seconds Radiation Exposure Index (if provided by the fluoroscopic device): 17.00 mGy Number of Acquired Spot Images: 1 COMPARISON:  None Available. FINDINGS: Attempted  placement of feeding tube. Immediate epistaxis with introduction of the tube, potentially from attempts to place the tube yesterday; bleeding controlled by nasal pressure. Multiple times the feeding tube was placed into the trachea with a spontaneous cough reflex. Tube was removed. Despite repeated swallowing, the esophagus could not be intubated. Patient unable to flex chin to chest to aid in placement due to recent cervical spine surgery and presence of soft cervical collar. IMPRESSION: Unsuccessful attempts to place feeding tube. Procedure and epistaxis discussed with Dr. Wynetta Emery at conclusion of procedure. Electronically Signed   By: Lavonia Dana M.D.   On: 10/22/2022 12:02   DG Swallowing Func-Speech Pathology  Result Date: 10/21/2022 Table formatting from the original result was not included. MBSS at [facility], inpatient:  10/21/22 1200 SLP Visit Information SLP Received On 10/21/22 General Information HPI LYRICA MCCLARTY is a 88 y.o. 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  Patient was lying in bed with neck brace and most of the history was obtained from ED  physician and niece at the bedside.  Per report, patient presented with cough and congestion a few days ago, her physician was contacted and recommended giving Mucinex, patient's aide called the niece this afternoon that patient was vomiting, niece activated EMS and on arrival of EMS team, she was noted to be hypoxic with an O2 sat of 84% on room air, NRB at 15 L was provided with improvement in oxygenation to 98%.  Patient was tachycardic at rate of 136, she was febrile with a temperature of 102F (orally).  Code sepsis was activated en route to the ED.;CXR indicated on 10/19/22 potential Aspiration PNA or B PNA.BSEcompleted this AM and MBSS recommended. Caregiver present No Diet Prior to this Study NPO Temperature  Normal Respiratory Status WFL Supplemental O2 Nasal cannula History of Recent Intubation Yes Length of Intubations (days) 1 days Date extubated 10/17/22 Number of Intubations 1 Self extubation  No Behavior/Cognition Alert;Cooperative Self-Feeding Abilities Able to self-feed Baseline vocal quality/speech Normal Volitional Cough Able to elicit Volitional Cough Assessment Appears WFL Volitional Swallow Able to elicit Anatomy Presence of cervical hardware (posterior pharyngeal wall swelling s/p ACDF surgery on Friday) Orofacial Exam Oral Cavity Assessment WFL Oral Cavity - Dentition Adequate natural dentition Orofacial Anatomy WFL Oral Motor/Sensory Function WFL Thin Liquids (Level 0) Thin Liquids  Impaired Bolus delivery method Spoon Thin Liquid - Impairment Pharyngeal impairment Initiation of swallow  Valleculae Soft palate elevation Complete Tongue base retraction Wide column of contrast or air between tongue base and PPW Laryngeal elevation Complete superior movement of thyroid cartilage with complete approximation of arytenoids to epiglottic petiole Anterior hyoid excursion Complete Epiglottic movement Partial Laryngeal vestibule closure Incomplete Pharyngeal stripping wave  Present - diminished  Pharyngeal contraction (A/P view only) N/A Pharyngoesophageal segment opening Partial distention/partial duration, partial obstruction of flow Pharyngeal residue Collection of residue within or on pharyngeal structures Location of pharyngeal residue Valleculae;Pharyngeal wall;Pyriform sinuses Penetration/Aspiration Scale (PAS) score 7.  Material enters airway, passes BELOW cords and not ejected out despite cough attempt by patient Mildly thick liquids (Level 2, nectar thick) Mildly thick liquids (Level 2, nectar thick) Impaired Bolus delivery method Spoon Mildly Thick Liquid - Impairment Pharyngeal impairment Initiation of swallow  Valleculae Soft palate elevation Complete Tongue base retraction Wide column of contrast  or air between tongue base and PPW Laryngeal elevation Complete superior movement of thyroid cartilage with complete approximation of arytenoids to epiglottic petiole Anterior hyoid excursion Complete Epiglottic movement Partial Laryngeal vestibule closure Incomplete Pharyngeal stripping wave  Present - diminished Pharyngeal contraction (A/P view only) N/A Pharyngoesophageal segment opening Partial distention/partial duration, partial obstruction of flow Pharyngeal residue Collection of residue within or on pharyngeal structures Location of pharyngeal residue Valleculae;Pharyngeal wall;Pyriform sinuses Penetration/Aspiration Scale (PAS) score 7.  Material enters airway, passes BELOW cords and not ejected out despite cough attempt by patient Moderately thick liquids (Level 3, honey thick) Moderately thick liquids (Level 3, honey thick) Impaired Moderately Thick Liquid - Impairment Pharyngeal impairment Initiation of swallow  Valleculae Soft palate elevation Complete Tongue base retraction Wide column of contrast or air between tongue base and PPW Laryngeal elevation Complete superior movement of thyroid cartilage with complete approximation of arytenoids to epiglottic petiole Anterior hyoid excursion  Complete Epiglottic movement Partial Laryngeal vestibule closure Incomplete Pharyngeal stripping wave  Present - diminished Pharyngeal contraction (A/P view only) N/A Pharyngoesophageal segment opening Partial distention/partial duration, partial obstruction of flow Pharyngeal residue Collection of residue within or on pharyngeal structures Location of pharyngeal residue Valleculae;Pharyngeal wall;Pyriform sinuses Penetration/Aspiration Scale (PAS) score 7.  Material enters airway, passes BELOW cords and not ejected out despite cough attempt by patient Puree Puree Impaired Puree - Impairment Pharyngeal impairment Initiation of swallow Valleculae Soft palate elevation Complete Tongue base retraction Wide column of contrast or air between tongue base and PPW Laryngeal elevation Complete superior movement of thyroid cartilage with complete approximation of arytenoids to epiglottic petiole Anterior hyoid excursion Complete Epiglottic movement Partial Laryngeal vestibule closure Incomplete Pharyngeal stripping wave  Present - diminished Pharyngeal contraction (A/P view only) N/A Pharyngoesophageal segment opening Partial distention/partial duration, partial obstruction of flow Pharyngeal residue Collection of residue within or on pharyngeal structures;Majority of contrast within or on pharyngeal structures Location of pharyngeal residue Valleculae;Pharyngeal wall;Pyriform sinuses Penetration/Aspiration Scale (PAS) score 7.  Material enters airway, passes BELOW cords and not ejected out despite cough attempt by patient Solid Solid Not Tested Pill Pill Not Tested Compensatory Strategies Compensatory strategies Yes Effortful swallow Ineffective Multiple swallows Effective Effective Multiple Swallows Thin liquid (Level 0);Mildly thick liquid (Level 2, nectar thick);Moderately thick liquid (Level 3, honey thick);Puree Chin tuck Ineffective Left head turn Ineffective Right head turn Ineffective Clinical Impression Clinical  Impression Pt presents with moderate/severe pharyngeal phase dysphagia characterized by vallecular trigger with tsp thin, NTL, HTL, and puree, posterior pharyngeal wall edema negatively impacting epiglottic deflection and pharyngeal constriction resulting in penetration during the swallow, aspiration after the swallow, and pharyngeal residue. Pt with episodes of gross aspiration after the swallow from the residuals which was sensed and mostly removed, but then unable to completely clear pharynx. Head turns left and right were attempted, but found to be ineffective. Pt with limited head movement due to soft neck brace and discomfort for chin down. Recommend NPO with alternative means of nutrition via NG in the hopes that swelling will decrease over the next 10-14 days. Pt has a very strong cough, however she is unable to completely clear all aspirates and then unable to completely clear pharynx at this time. Consider consultation with Dr. Trenton Gammon, who did the surgery to see if steroids could be beneficial during this stage of her recovery. SLP to follow and can administer single ice chips for comfort and to help prevent muscle disuse atrophy (SLP for puree trials to assess for readiness for repeat instrumental assessment).  RN to complete oral care and have suction at bedside. Pt was shown how to self use the [place] suction and encouraged to cough and clear. SLP will follow.  SLP Visit Diagnosis Dysphagia, pharyngeal phase (R13.13);Dysphagia, pharyngoesophageal phase (R13.14) Factors that may increase risk of adverse event in presence of aspiration ([place] 2021) Limited mobility;Frail or deconditioned;Frequent aspiration of large volumes Swallowing Evaluation Recommendations Recommendations NPO;Alternative means of nutrition - NG Tube Medication Administration Via alternative means Swallowing strategies   (trials puree with SLP only) Postural changes Position pt fully upright for meals;Stay upright 30-60 min  after meals Oral care recommendations Oral care QID (4x/day);Staff/trained caregiver to provide oral care Recommended consults Consider dietitian consultation Treatment Plan Treatment recommendations Therapy as outlined in treatment plan below Follow-up recommendations Skilled nursing-short term rehab (<3 hours/day) Assistance recommended at discharge Frequent or constant supervision/assistance Functional status assessment Patient has had a recent decline in their functional status and demonstrates the ability to make significant improvements in function in a reasonable and predictable amount of time. Treatment frequency Min 2x/week Treatment duration 1 week Interventions Aspiration precaution training;Trials of upgraded texture/liquids;Patient/family education Goal Planning Prognosis for Safe Diet Advancement Fair Barriers to Reach Goals Severity of deficits Patient/Family Stated Goal Swallow safely Consulted and agree with results and recommendations Patient;Physician SLP Time Calculation SLP Start Time (ACUTE ONLY) 1151 SLP Stop Time (ACUTE ONLY) 1223 SLP Time Calculation (min) (ACUTE ONLY) 32 min SLP Evaluations $ SLP Speech Visit 1 Visit SLP Evaluations $MBS Swallow 1 Procedure Thank you, Genene Churn, [place]   [facility] Chest Port 1V same Day  Result Date: 10/21/2022 CLINICAL DATA:  Follow-up malpositioned NG tube. EXAM: PORTABLE CHEST 1 VIEW COMPARISON:  Earlier chest x-ray, same day. FINDINGS: The NG tube has been removed. No evidence of a right-sided pneumothorax. The left PICC line is stable. Persistent bibasilar atelectasis or infiltrates and small left pleural effusion. IMPRESSION: 1. Removal of malpositioned NG tube.  No right-sided pneumothorax. 2. Persistent bibasilar atelectasis or infiltrates and small left pleural effusion. Electronically Signed   By: Marijo Sanes M.D.   On: 10/21/2022 14:33   DG CHEST PORT 1 VIEW  Addendum Date: 10/21/2022   ADDENDUM REPORT: 10/21/2022 14:09  ADDENDUM: The original report was by Dr. Van Clines. The following addendum is by Dr. Van Clines: Critical Value/emergent results were called by telephone at the time of interpretation on 10/21/2022 at 2:05 pm to provider [facility] , who verbally acknowledged these results. Electronically Signed   By: Van Clines M.D.   On: 10/21/2022 14:09   Result Date: 10/21/2022 CLINICAL DATA:  Nasogastric tube placement. EXAM: PORTABLE CHEST 1 VIEW COMPARISON:  10/19/2022 FINDINGS: The nasogastric tube tip projects over the right lower lobe medially, and is fairly peripheral in the right lung. This tube is malposition. There is some continued airspace opacities in both lung bases along with blunting of the left costophrenic angle. Borderline enlargement of the cardiopericardial silhouette. Atherosclerotic calcification of the aortic arch. No current pneumothorax. Dextroconvex thoracolumbar scoliosis. Left upper quadrant density likely from contrast medium in the stomach. Left-sided PICC line tip: Cavoatrial junction. IMPRESSION: 1. Malpositioned nasogastric tube with the tip projecting peripherally over the right lower lobe medially. No currently visible pneumothorax; removal recommended with immediate post removal chest radiograph to rule out post removal pneumothorax prior to attempts at replacement. Close clinical observation of the patient is recommended following removal of the tube until chest radiography can confirm the lack of a post-removal pneumothorax. 2. Continued airspace opacities  in both lung bases with blunting of the left costophrenic angle. 3. Borderline enlargement of the cardiopericardial silhouette. 4. Left upper quadrant density likely from contrast medium in the stomach. 5. Dextroconvex thoracolumbar scoliosis. Radiology assistant personnel have been notified to put me in telephone contact with the referring physician or the referring physician's clinical representative in order to  discuss these findings. Once this communication is established I will issue an addendum to this report for documentation purposes. Electronically Signed: By: Van Clines M.D. On: 10/21/2022 14:03   [name] EKG SITE RITE  Result Date: 10/20/2022 If Site Rite image not attached, placement could not be confirmed due to current cardiac rhythm.  DG Chest Port 1 View  Result Date: 10/19/2022 CLINICAL DATA:  Questionable sepsis. EXAM: PORTABLE CHEST 1 VIEW COMPARISON:  PA Lat 05/16/2022 FINDINGS: There is mild cardiomegaly without evidence of CHF. Mild aortic tortuosity and calcification with stable mediastinum. Since the prior study there has developed patchy airspace disease in both lung bases consistent with bilateral pneumonia or aspiration. Minimal pleural effusions have also developed. The mid and upper lung fields are clear. There are degenerative changes of the thoracic spine and old right axillary surgical clips. IMPRESSION: 1. New patchy airspace disease in both lung bases consistent with bilateral pneumonia or aspiration. Minimal pleural effusions. Follow-up study recommended after treatment to ensure clearing. 2. Mild cardiomegaly without evidence of CHF. 3. Aortic atherosclerosis. Electronically Signed   By: Telford Nab M.D.   On: 10/19/2022 21:35   DG Cervical Spine 2 or 3 views  Result Date: 10/17/2022 CLINICAL DATA:  Fluoroscopic assistance for cervical spinal fusion EXAM: CERVICAL SPINE - 2-3 VIEW COMPARISON:  06/01/2022 FINDINGS: Fluoroscopic images show anterior surgical fusion from C3-C5 levels. Fluoroscopy time 7.3 seconds. Radiation dose 0.61 mGy. IMPRESSION: Fluoroscopic assistance was provided for anterior surgical fusion at C3-C4 and C4-C5 levels. Electronically Signed   By: Elmer Picker M.D.   On: 10/17/2022 18:38   DG C-Arm 1-60 Min-No Report  Result Date: 10/17/2022 Fluoroscopy was utilized by the requesting physician.  No radiographic interpretation.   DG C-Arm  1-60 Min-No Report  Result Date: 10/17/2022 Fluoroscopy was utilized by the requesting physician.  No radiographic interpretation.   CT ABDOMEN PELVIS WO CONTRAST  Result Date: 10/16/2022 CLINICAL DATA:  88 year old female presents with history of bladder cancer for follow-up. * Tracking Code: BO * EXAM: CT ABDOMEN AND PELVIS WITHOUT CONTRAST TECHNIQUE: Multidetector CT imaging of the abdomen and pelvis was performed following the standard protocol without IV contrast. RADIATION DOSE REDUCTION: This exam was performed according to the departmental dose-optimization program which includes automated exposure control, adjustment of the mA and/or kV according to patient size and/or use of iterative reconstruction technique. COMPARISON:  January 24, 2022. FINDINGS: Lower chest: Basilar atelectasis. No effusion. No consolidative changes. Hepatobiliary: Smooth hepatic contours. Post cholecystectomy. No gross biliary duct distension. No visible lesion on noncontrast imaging. Pancreas: Limited assessment due to lack of intravenous contrast. Fatty atrophy favoring the head of the pancreas. Spleen: Normal. Adrenals/Urinary Tract: Adrenal glands are normal. There is new fullness of LEFT ureter and intrarenal collecting systems since prior imaging with mild LEFT hydronephrosis. No perinephric stranding. No gross calculus in the distal LEFT ureter. Transition does occur at the LEFT bladder base however. This areas obscured by streak artifact from hip arthroplasty changes. Large staghorn calculus in the interpolar and lower pole or RIGHT intrarenal collecting system similar to prior imaging. Enlarging intermediate density lesion arises from the lower pole the RIGHT  kidney measuring 59 Hounsfield units at 1.5 cm previously 1.2 cm in May of 2023. No gross stranding adjacent urinary bladder. Stomach/Bowel: No acute gastrointestinal findings. Signs of prior bowel resection with ileocolonic anastomosis. Dilute contrast passes  into the colon with patulous appearance of the proximal colon. Vascular/Lymphatic: Aortic atherosclerosis. No sign of aneurysm. Smooth contour of the IVC. There is no gastrohepatic or hepatoduodenal ligament lymphadenopathy. No retroperitoneal or mesenteric lymphadenopathy. No pelvic sidewall lymphadenopathy. Limited assessment of vascular structures due to lack of intravenous contrast. Reproductive: Cystic RIGHT adnexal lesion stable 4 cm greatest axial dimension only minimally changed since imaging from 2017. Other: No ascites.  No pneumoperitoneum. Musculoskeletal: RIGHT hip arthroplasty, incompletely imaged. Osteopenia. Spinal degenerative changes. IMPRESSION: 1. New mild LEFT hydroureteronephrosis in this patient with history of prior bladder neoplasm. Stricture related to therapy or lesion are considered as causes in this patient with history of cancer near the trigone. Cystoscopic assessment may be warranted if not recently performed. 2. Large staghorn calculus of the RIGHT kidney. 3. RIGHT renal lesion with intermediate density more likely hemorrhagic cyst based on simple appearance seen on prior imaging studies. Consider follow-up renal sonogram. 4. Cystic RIGHT adnexal lesion stable 4 cm greatest axial dimension only minimally changed since imaging from 2017. Recommend follow-up US in 6-12 months. Note: This recommendation does not apply to premenarchal patients and to those with increased risk (genetic, family history, elevated tumor markers or other high-risk factors) of ovarian cancer. Reference: JACR 2020 Feb; 17(2):248-254 5. Aortic atherosclerosis. Aortic Atherosclerosis (ICD10-I70.0). Electronically Signed   By: Zetta Bills M.D.   On: 10/16/2022 14:05    Microbiology: Results for orders placed or performed during the hospital encounter of 10/19/22  Urine Culture     Status: Abnormal   Collection Time: 10/19/22  9:10 PM   Specimen: In/Out Cath Urine  Result Value Ref Range Status    Specimen Description   Final    IN/OUT CATH URINE Performed at [facility], [address], [place], [place] [address]    Special Requests   Final    NONE Performed at [facility], [address], [place], [place] [address]    Culture 6,000 COLONIES/mL YEAST (A)  Final   Report Status 10/21/2022 FINAL  Final  Culture, blood (Routine x 2)     Status: None   Collection Time: 10/19/22  9:30 PM   Specimen: BLOOD LEFT FOREARM  Result Value Ref Range Status   Specimen Description BLOOD LEFT FOREARM  Final   Special Requests   Final    BOTTLES DRAWN AEROBIC AND ANAEROBIC Blood Culture adequate volume   Culture   Final    NO GROWTH 5 DAYS Performed at [facility], [address], [place], [place] [address]    Report Status 10/24/2022 FINAL  Final  Culture, blood (Routine x 2)     Status: None   Collection Time: 10/19/22  9:54 PM   Specimen: BLOOD LEFT HAND  Result Value Ref Range Status   Specimen Description BLOOD LEFT HAND  Final   Special Requests   Final    BOTTLES DRAWN AEROBIC ONLY Blood Culture adequate volume   Culture   Final    NO GROWTH 5 DAYS Performed at [facility], [address], [place], [place] [address]    Report Status 10/24/2022 FINAL  Final  Resp Panel by RT-PCR (Flu A&B, Covid) Anterior Nasal Swab     Status: None   Collection Time: 10/19/22 10:14 PM   Specimen: Anterior Nasal  Swab  Result Value Ref Range Status   SARS Coronavirus 2 by RT PCR NEGATIVE NEGATIVE Final    Comment: (NOTE) SARS-CoV-2 target nucleic acids are NOT DETECTED.  The SARS-CoV-2 RNA is generally detectable in upper respiratory specimens during the acute phase of infection. The lowest concentration of SARS-CoV-2 viral copies this assay can detect is 138 copies/mL. A negative result does not preclude SARS-Cov-2 infection and should not be used as the sole basis for treatment or other patient management decisions. A negative result may occur with  improper specimen collection/handling,  submission of specimen other than nasopharyngeal swab, presence of viral mutation(s) within the areas targeted by this assay, and inadequate number of viral copies(<138 copies/mL). A negative result must be combined with clinical observations, patient history, and epidemiological information. The expected result is Negative.  Fact Sheet for Patients:  EntrepreneurPulse.com.au  Fact Sheet for Healthcare Providers:  IncredibleEmployment.be  This test is no t yet approved or cleared by the [place] FDA and  has been authorized for detection and/or diagnosis of SARS-CoV-2 by FDA under an Emergency Use Authorization (EUA). This EUA will remain  in effect (meaning this test can be used) for the duration of the COVID-19 declaration under Section 564(b)(1) of the Act, 21 U.S.C.section 360bbb-3(b)(1), unless the authorization is terminated  or revoked sooner.       Influenza A by PCR NEGATIVE NEGATIVE Final   Influenza B by PCR NEGATIVE NEGATIVE Final    Comment: (NOTE) The Xpert Xpress SARS-CoV-2/FLU/RSV plus assay is intended as an aid in the diagnosis of influenza from Nasopharyngeal swab specimens and should not be used as a sole basis for treatment. Nasal washings and aspirates are unacceptable for Xpert Xpress SARS-CoV-2/FLU/RSV testing.  Fact Sheet for Patients: EntrepreneurPulse.com.au  Fact Sheet for Healthcare Providers: IncredibleEmployment.be  This test is not yet approved or cleared by the [place] FDA and has been authorized for detection and/or diagnosis of SARS-CoV-2 by FDA under an Emergency Use Authorization (EUA). This EUA will remain in effect (meaning this test can be used) for the duration of the COVID-19 declaration under Section 564(b)(1) of the Act, 21 U.S.C. section 360bbb-3(b)(1), unless the authorization is terminated or revoked.  Performed at [facility], [address] [place], [place] [address]     Labs: CBC: Recent Labs  Lab 10/25/22 0508  WBC 12.7*  HGB 10.9*  HCT 33.6*  MCV 85.3  PLT 867   Basic Metabolic Panel: Recent Labs  Lab 10/25/22 0508 10/26/22 0510 10/28/22 0430 10/29/22 0628 10/30/22 0500  NA 146* 139 137 136 137  K 3.2* 3.8 3.2* 4.0 4.4  CL 114* 110 105 107 110  CO2 '24 23 23 22 '$ 21*  GLUCOSE 144* 107* 134* 163* 132*  BUN 35* 39* 33* 36* 40*  CREATININE 1.25* 1.23* 1.29* 1.32* 1.16*  CALCIUM 9.0 8.7* 8.8* 8.4* 8.8*  MG 2.0  --  1.8 2.4 2.2  PHOS  --   --  2.7 3.0 2.9   Liver Function Tests: Recent Labs  Lab 10/25/22 0508 10/28/22 0430 10/29/22 0628 10/30/22 0500  AST '25 28 19 19  '$ ALT 18 33 25 19  ALKPHOS 75 74 73 74  BILITOT 0.8 0.8 0.6 0.4  PROT 6.3* 5.8* 5.8* 6.0*  ALBUMIN 3.1* 3.1* 2.9* 3.0*   CBG: Recent Labs  Lab 10/30/22 0519 10/30/22 1224 10/30/22 1758 10/31/22 0138 10/31/22 0646  GLUCAP 153* 160* 127* 117* 125*    Discharge time spent: greater than 30 minutes.  Signed: Orson Eva, MD [facility] 10/31/2022

## 2022-10-31 NOTE — TOC Transition Note (Signed)
Transition of Care [facility]) - CM/SW Discharge Note   Patient Details  Name: ZAIDY ABSHER MRN: [id number] Date of Birth: [birth year]  [facility] CM/SW Contact:  Boneta Lucks, RN Phone Number: 10/31/2022, 11:30 AM   Clinical Narrative:   [facility] is ready for patient. Waiting on COVID test results. RN will call report to room 131. [name] updated.    Final next level of care: Skilled Nursing Facility Barriers to Discharge: Barriers Resolved   Patient Goals and CMS Choice Patient states their goals for this hospitalization and ongoing recovery are:: to go to [facility] CMS Medicare.gov Compare Post Acute Care list provided to:: Patient Represenative (must comment) Choice offered to / list presented to : Adult Children  Discharge Placement              Patient chooses bed at:  [facility])   Name of family member notified: [name] Patient and family notified of of transfer: 10/31/22  Discharge Plan and Services In-house Referral: Clinical Social Work                Readmission Risk Interventions    10/21/2022    9:21 AM 06/15/2020   10:21 AM  Readmission Risk Prevention Plan  Medication Screening  Complete  Transportation Screening Complete Complete  Medication Review (RN Care Manager) Complete   HRI or [place] Complete   SW Recovery Care/Counseling Consult Complete   Palliative Care Screening Not [place] Not Applicable

## 2022-10-31 NOTE — Progress Notes (Signed)
Called report to Dell at the [facility].

## 2022-10-31 NOTE — Care Management Important Message (Signed)
Important Message  Patient Details  Name: Jane Andrews MRN: [id number] Date of Birth: [birth year]   Medicare Important Message Given:  Yes     Tommy Medal 10/31/2022, 10:49 AM

## 2022-11-02 ENCOUNTER — Encounter (HOSPITAL_COMMUNITY)
Admission: RE | Admit: 2022-11-02 | Discharge: 2022-11-02 | Disposition: A | Discharge status: Home / Self Care | Payer: Medicare Other | Source: Skilled Nursing Facility | Attending: Adult Health | Admitting: Adult Health

## 2022-11-02 DIAGNOSIS — N1832 Chronic kidney disease, stage 3b: Secondary | ICD-10-CM | POA: Insufficient documentation

## 2022-11-02 LAB — PHOSPHORUS: Phosphorus: 3.8 mg/dL (ref 2.5–4.6)

## 2022-11-02 LAB — MAGNESIUM: Magnesium: 1.9 mg/dL (ref 1.7–2.4)

## 2022-11-03 ENCOUNTER — Non-Acute Institutional Stay (SKILLED_NURSING_FACILITY): Payer: Medicare Other | Admitting: Internal Medicine

## 2022-11-03 ENCOUNTER — Encounter: Payer: Self-pay | Admitting: Internal Medicine

## 2022-11-03 DIAGNOSIS — K529 Noninfective gastroenteritis and colitis, unspecified: Secondary | ICD-10-CM | POA: Diagnosis not present

## 2022-11-03 DIAGNOSIS — T50905A Adverse effect of unspecified drugs, medicaments and biological substances, initial encounter: Secondary | ICD-10-CM

## 2022-11-03 DIAGNOSIS — R739 Hyperglycemia, unspecified: Secondary | ICD-10-CM

## 2022-11-03 DIAGNOSIS — J189 Pneumonia, unspecified organism: Secondary | ICD-10-CM

## 2022-11-03 DIAGNOSIS — J690 Pneumonitis due to inhalation of food and vomit: Secondary | ICD-10-CM

## 2022-11-03 NOTE — Patient Instructions (Signed)
See assessment and plan under each diagnosis in the problem list and acutely for this visit 

## 2022-11-03 NOTE — Progress Notes (Unsigned)
NURSING HOME LOCATION:  [facility] ROOM NUMBER:  131 P  CODE STATUS:  DNR  PCP: Redmond School MD   This is a comprehensive admission note to this SNFperformed on this date less than 30 days from date of admission. Included are preadmission medical/surgical history; reconciled medication list; family history; social history and comprehensive review of systems.  Corrections and additions to the records were documented. Comprehensive physical exam was also performed. Additionally a clinical summary was entered for each active diagnosis pertinent to this admission in the Problem List to enhance continuity of care.  HPI: She was hospitalized 11/5 - 10/31/2022 with community-acquired pneumonia. She had been hospitalized 11/3 for C3-4, C4-5 anterior cervical discectomy and fusion.  She had been discharged home with a soft neck brace with bedrest. At home she developed a cough and congestion followed by vomiting.  Her niece activated EMS which found her to be hypoxic with O2 sat of 84% on room air.  NRB at 15 L/min was initiated with improvement in oxygenation to 98%.  Tachycardia was present with a rate of 136 and temp was noted to be 102 F.  Code sepsis was activated in route to the ED. In the ED respiratory rate is 25 heart rate 138.  O2 sats were 94% on 2 L/min of nasal oxygen.  White count was 11,400.  Lactic acid level was 1.8.  Influenza A and B and C-19 screening were negative.  Chest x-ray revealed patchy airspace changes in both lung bases consistent with bilateral CAP versus aspiration.  Minimal pleural effusions were noted.  IV ceftriaxone and azithromycin were initiated. Full course of Unasyn was completed as of 11/9. Neurosurgeon Dr. Trenton Gammon recommended IV Decadron 4 mg IV every 6 hours which was continued x5 days with no meaningful clinical improvement in her severe dysphagia.  Decadron was weaned.  Speech therapy consulted and performed MBSS on 11/13 which revealed  ongoing aspiration.  EGD could not be performed due to severe edema; IR consulted and placed G-tube.  Enteral feeding was initiated 1/16 after initial TNA via PICC line.  Enteral feeding was titrated as tolerated. Course was complicated by persistent A-fib; IV Lopressor every 6 hours was to be transition through the feeding tube.  Cardiology consulted and felt she was not a candidate for anticoagulation. Systolic blood pressure was significant only elevated up into the 160s.  She received IV hydralazine and metoprolol was increased.  On 11/16 amlodipine and metoprolol were initiated via the feeding tube. Sliding scale insulin coverage was provided for hyperglycemia in the setting of the IV Decadron to reduce the cervical edema. As she was unable to take the oral L thyroxine she received levothyroxine 37.5 mcg on 1 dose on 11/12 with repeat dose 11/14 with subsequent supplementation via the feeding tube.   Past medical and surgical history is long and complicated and includes history of intra-abdominal adhesions, history of right breast cancer, history of cholelithiasis, essential hypertension, history of nephrolithiasis, history of GI bleed, and short gut syndrome. Surgeries and procedures include abdominal hysterectomy following delivery because of massive bleeding; cholecystectomy; colonoscopy with polypectomy; EGD; cystoscopy with retrogrades; right mastectomy; THA; and transurethral resection of bladder tumor.  Social history: Nondrinker; former 30-pack-year smoker.  Family history: Noncontributory due to advanced age.   Review of systems: Clinical neurocognitive deficits made validity of responses questionable . Date given as October 17, 2022.  When asked why she had been in the hospital she stated that "fell in my neck  not healed.".  She went on to state "cannot do anything, stay in bed."  She was unaware of the diagnosis of pneumonia and its treatment.  She did  validate "my throat sort of  stopped up."  He does state that she was unable to participate in PT today because of "diarrhea."  She complains that her hands are "plum numb."  Constitutional: No fever, significant weight change, fatigue  Eyes: No redness, discharge, pain, vision change ENT/mouth: No nasal congestion, purulent discharge, earache, change in hearing, sore throat  Cardiovascular: No chest pain, palpitations, paroxysmal nocturnal dyspnea, claudication, edema  Respiratory: No cough, sputum production, hemoptysis, DOE, significant snoring, apnea Gastrointestinal: No heartburn, dysphagia, abdominal pain, nausea /vomiting, rectal bleeding, melena, change in bowels Genitourinary: No dysuria, hematuria, pyuria, incontinence, nocturia Musculoskeletal: No joint stiffness, joint swelling, weakness, pain Dermatologic: No rash, pruritus, change in appearance of skin Neurologic: No dizziness, headache, syncope, seizures, numbness, tingling Psychiatric: No significant anxiety, depression, insomnia, anorexia Endocrine: No change in hair/skin/nails, excessive thirst, excessive hunger, excessive urination  Hematologic/lymphatic: No significant bruising, lymphadenopathy, abnormal bleeding Allergy/immunology: No itchy/watery eyes, significant sneezing, urticaria, angioedema  Physical exam:  Pertinent or positive findings: She appears her age and chronically ill.  Wearing a soft cervical collar.  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  Grade 1 systolic murmur is present at the left sternal border.  She has minor rales at the bases.  PEG tube is present.  Pedal pulses are decreased.  Her limbs are atrophic.  She has irregular scarring and hyperpigmentation over the shins.  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 General appearance: Adequately nourished; no acute distress, increased work of breathing is present.   Lymphatic: No lymphadenopathy about the head, neck, axilla. Eyes: No conjunctival inflammation or  lid edema is present. There is no scleral icterus. Ears:  External ear exam shows no significant lesions or deformities.   Nose:  External nasal examination shows no deformity or inflammation. Nasal mucosa are pink and moist without lesions, exudates Oral exam: Lips and gums are healthy appearing.There is no oropharyngeal erythema or exudate. Neck:  No thyromegaly, masses, tenderness noted.    Heart:  Normal rate and regular rhythm. S1 and S2 normal without gallop, murmur, click, rub.  Lungs: Chest clear to auscultation without wheezes, rhonchi, rales, rubs. Abdomen: Bowel sounds are normal.  Abdomen is soft and nontender with no organomegaly, hernias, masses. GU: Deferred  Extremities:  No cyanosis, clubbing, edema. Neurologic exam:  Strength equal  in upper & lower extremities. Balance, Rhomberg, finger to nose testing could not be completed due to clinical state Deep tendon reflexes are equal Skin: Warm & dry w/o tenting. No significant lesions or rash.  See clinical summary under each active problem in the Problem List with associated updated therapeutic plan

## 2022-11-03 NOTE — Assessment & Plan Note (Signed)
She continues to remain n.p.o. because of intractable dysphagia.  Decadron has been weaned; rechallenge will not be performed unless there is significant improvement.

## 2022-11-03 NOTE — Assessment & Plan Note (Signed)
Bilateral patchy infiltrates most likely related to aspiration in the context of intractable dysphagia following C3-4, C4-5 anterior cervical discectomy and fusion on 11/13/2022.  Initially she received TNA via PICC line with subsequent placement of gastrostomy tube with advancement of meds and nutrition as tolerated.

## 2022-11-03 NOTE — Assessment & Plan Note (Signed)
At this time she is having both loose stool as well as diarrhea.  Probiotic will be initiated because of the recent course of antibiotics. C dif on any frankly watery stool. A limited trial of Lomotil will be initiated.

## 2022-11-04 DIAGNOSIS — T50905A Adverse effect of unspecified drugs, medicaments and biological substances, initial encounter: Secondary | ICD-10-CM | POA: Insufficient documentation

## 2022-11-04 DIAGNOSIS — R739 Hyperglycemia, unspecified: Secondary | ICD-10-CM | POA: Insufficient documentation

## 2022-11-04 NOTE — Assessment & Plan Note (Signed)
Glucoses @ SNF 95-143; SSI D/Ced. Monitor glucoses bid; Staff to notify NP if > 150.

## 2022-11-05 ENCOUNTER — Other Ambulatory Visit: Payer: Self-pay | Admitting: Adult Health

## 2022-11-05 MED ORDER — ALPRAZOLAM 0.5 MG PO TABS: 1.0000 mg | ORAL_TABLET | Freq: Every day | ORAL | 0 refills | Status: DC

## 2022-11-06 ENCOUNTER — Encounter (HOSPITAL_COMMUNITY)
Admission: RE | Admit: 2022-11-06 | Discharge: 2022-11-06 | Disposition: A | Discharge status: Home / Self Care | Payer: Medicare Other | Source: Skilled Nursing Facility | Attending: Adult Health | Admitting: Adult Health

## 2022-11-06 DIAGNOSIS — I480 Paroxysmal atrial fibrillation: Secondary | ICD-10-CM | POA: Insufficient documentation

## 2022-11-06 DIAGNOSIS — E43 Unspecified severe protein-calorie malnutrition: Secondary | ICD-10-CM | POA: Insufficient documentation

## 2022-11-06 DIAGNOSIS — K910 Vomiting following gastrointestinal surgery: Secondary | ICD-10-CM | POA: Insufficient documentation

## 2022-11-06 DIAGNOSIS — I5032 Chronic diastolic (congestive) heart failure: Secondary | ICD-10-CM | POA: Insufficient documentation

## 2022-11-06 LAB — BASIC METABOLIC PANEL
Anion gap: 4 — ABNORMAL LOW (ref 5–15)
BUN: 62 mg/dL — ABNORMAL HIGH (ref 8–23)
CO2: 19 mmol/L — ABNORMAL LOW (ref 22–32)
Calcium: 8.5 mg/dL — ABNORMAL LOW (ref 8.9–10.3)
Chloride: 110 mmol/L (ref 98–111)
Creatinine, Ser: 1.26 mg/dL — ABNORMAL HIGH (ref 0.44–1.00)
GFR, Estimated: 39 mL/min — ABNORMAL LOW (ref >60)
Glucose, Bld: 114 mg/dL — ABNORMAL HIGH (ref 70–99)
Potassium: 5.9 mmol/L — ABNORMAL HIGH (ref 3.5–5.1)
Sodium: 133 mmol/L — ABNORMAL LOW (ref 135–145)

## 2022-11-06 LAB — CBC
HCT: 33.7 % — ABNORMAL LOW (ref 36.0–46.0)
Hemoglobin: 10.7 g/dL — ABNORMAL LOW (ref 12.0–15.0)
MCH: 27.4 pg (ref 26.0–34.0)
MCHC: 31.8 g/dL (ref 30.0–36.0)
MCV: 86.4 fL (ref 80.0–100.0)
Platelets: 187 10*3/uL (ref 150–400)
RBC: 3.9 MIL/uL (ref 3.87–5.11)
RDW: 18.3 % — ABNORMAL HIGH (ref 11.5–15.5)
WBC: 10.6 10*3/uL — ABNORMAL HIGH (ref 4.0–10.5)
nRBC: 0 % (ref 0.0–0.2)

## 2022-11-06 LAB — MAGNESIUM: Magnesium: 2.5 mg/dL — ABNORMAL HIGH (ref 1.7–2.4)

## 2022-11-06 LAB — PHOSPHORUS: Phosphorus: 5.1 mg/dL — ABNORMAL HIGH (ref 2.5–4.6)

## 2022-11-10 ENCOUNTER — Encounter: Payer: Self-pay | Admitting: Adult Health

## 2022-11-10 ENCOUNTER — Non-Acute Institutional Stay (SKILLED_NURSING_FACILITY): Payer: Medicare Other | Admitting: Adult Health

## 2022-11-10 ENCOUNTER — Other Ambulatory Visit (HOSPITAL_COMMUNITY)
Admission: RE | Admit: 2022-11-10 | Discharge: 2022-11-10 | Disposition: A | Discharge status: Home / Self Care | Payer: Medicare Other | Source: Skilled Nursing Facility | Attending: Adult Health | Admitting: Adult Health

## 2022-11-10 DIAGNOSIS — E46 Unspecified protein-calorie malnutrition: Secondary | ICD-10-CM | POA: Diagnosis not present

## 2022-11-10 DIAGNOSIS — N184 Chronic kidney disease, stage 4 (severe): Secondary | ICD-10-CM

## 2022-11-10 DIAGNOSIS — E8809 Other disorders of plasma-protein metabolism, not elsewhere classified: Secondary | ICD-10-CM

## 2022-11-10 DIAGNOSIS — E875 Hyperkalemia: Secondary | ICD-10-CM

## 2022-11-10 DIAGNOSIS — E871 Hypo-osmolality and hyponatremia: Secondary | ICD-10-CM | POA: Diagnosis not present

## 2022-11-10 LAB — RENAL FUNCTION PANEL
Albumin: 3.3 g/dL — ABNORMAL LOW (ref 3.5–5.0)
Anion gap: 9 (ref 5–15)
BUN: 72 mg/dL — ABNORMAL HIGH (ref 8–23)
CO2: 20 mmol/L — ABNORMAL LOW (ref 22–32)
Calcium: 8.4 mg/dL — ABNORMAL LOW (ref 8.9–10.3)
Chloride: 103 mmol/L (ref 98–111)
Creatinine, Ser: 1.19 mg/dL — ABNORMAL HIGH (ref 0.44–1.00)
GFR, Estimated: 42 mL/min — ABNORMAL LOW (ref >60)
Glucose, Bld: 104 mg/dL — ABNORMAL HIGH (ref 70–99)
Phosphorus: 4.8 mg/dL — ABNORMAL HIGH (ref 2.5–4.6)
Potassium: 5.8 mmol/L — ABNORMAL HIGH (ref 3.5–5.1)
Sodium: 132 mmol/L — ABNORMAL LOW (ref 135–145)

## 2022-11-10 LAB — MAGNESIUM: Magnesium: 2.6 mg/dL — ABNORMAL HIGH (ref 1.7–2.4)

## 2022-11-10 NOTE — Progress Notes (Unsigned)
Location:  [facility] Room Number: 409 Place of Service:  SNF (31)   CODE STATUS: dnr   Allergies  Allergen Reactions   Codeine Nausea And Vomiting    Chief Complaint  Patient presents with   Acute Visit    Follow up lab work     HPI:  She has electrolyte disturbances: hyperkalemia; hypermagnesemia; hyponatremia. She does have fatigue; and her blood pressure readings are soft. She continues to have a tube feedings. There are no reports of uncontrolled pain.   Past Medical History:  Diagnosis Date   Abdominal adhesions 1994   Allergic rhinitis    Anemia    Anxiety and depression    Aortic stenosis    Arthritis    Atrial fibrillation ([place]) 10/2012   Associated with severe anemia and esophageal pill impaction   Breast carcinoma (HCC)    Right mastectomy   Cholelithiasis    Essential hypertension    Gastroesophageal reflux disease    Hiatal hernia   History of blood transfusion    Hypothyroidism    Low back pain    Malabsorption    Short gut syndrome following small bowel resection surgery x2   Nephrolithiasis 2004   Painless hematuria   Short gut syndrome    Bowel resection , 2004   Upper GI bleed 2004   Multiple episodes of melena-? due to gastritis or adverse drug effect (nonsteroidals, small bowel ulceration with Fosamax); caused by Pepto-Bismol during one Emergency Department evaluation    Past Surgical History:  Procedure Laterality Date   ABDOMINAL HYSTERECTOMY  12/15/1958   massive gynecologic bleeding post delivery   ANTERIOR CERVICAL DECOMP/DISCECTOMY FUSION N/A 10/17/2022   Procedure: Anterior Cervical Discectomy and Fusion Cervical Three-Four/Cervical Four-Five;  Surgeon: Earnie Larsson, MD;  Location: [facility];  Service: Neurosurgery;  Laterality: N/A;  3C   BOWEL RESECTION     Resulting short gut syndrome   CHOLECYSTECTOMY N/A 06/25/2018   Procedure: LAPAROSCOPIC CHOLECYSTECTOMY WITH INTRAOPERATIVE CHOLANGIOGRAM;  Surgeon:  Armandina Gemma, MD;  [facility]: [facility];  Service: General;  Laterality: N/A;   COLONOSCOPY W/ POLYPECTOMY  12/16/2003   Lipoma; diverticulosis   COLONOSCOPY WITH ESOPHAGOGASTRODUODENOSCOPY (EGD)  11/22/2012   [facility]   CYSTOSCOPY W/ RETROGRADES Bilateral 04/16/2022   Procedure: CYSTOSCOPY;  Surgeon: Primus Bravo., MD;  [facility]: [facility];  Service: Urology;  Laterality: Bilateral;   ESOPHAGOGASTRODUODENOSCOPY (EGD) WITH PROPOFOL N/A 04/04/2022   Procedure: ESOPHAGOGASTRODUODENOSCOPY (EGD) WITH PROPOFOL;  Surgeon: Rogene Houston, MD;  [facility];  Service: Endoscopy;  Laterality: N/A;  210   HIP ARTHROPLASTY Right 06/15/2020   Procedure: ANTERIOR ARTHROPLASTY BIPOLAR HIP (HEMIARTHROPLASTY);  Surgeon: Mcarthur Rossetti, MD;  Location: [facility];  Service: Orthopedics;  Laterality: Right;   IR GASTROSTOMY TUBE MOD SED  10/29/2022   LAPAROSCOPIC LYSIS OF ADHESIONS  12/16/1963   s/p adhesions   MASTECTOMY Right    Carcinoma of the breast; right   TRANSURETHRAL RESECTION OF BLADDER TUMOR N/A 04/16/2022   Procedure: TRANSURETHRAL RESECTION OF BLADDER TUMOR (TURBT);  Surgeon: Primus Bravo., MD;  [facility];  Service: Urology;  Laterality: N/A;   UPPER GASTROINTESTINAL ENDOSCOPY      Social History   Socioeconomic History   Marital status: Widowed    Spouse name: Not on file   Number of children: Not on file   Years of education: 12   Highest education level: Not on file  Occupational History   Not on file  Tobacco  Use   Smoking status: Former    Packs/day: 1.50    Years: 20.00    Total pack years: 30.00    Types: Cigarettes    Passive exposure: Never   Smokeless tobacco: Never  Vaping Use   Vaping Use: Never used  Substance and Sexual Activity   Alcohol use: No   Drug use: No   Sexual activity: Not Currently    Birth control/protection: Post-menopausal, Surgical    Comment: hyst  Other Topics Concern   Not on file  Social History Narrative    Not on file   Social Determinants of Health   Financial Resource Strain: Not on file  Food Insecurity: No Food Insecurity (10/20/2022)   Hunger Vital Sign    Worried About Running Out of Food in the Last Year: Never true    Ran Out of Food in the Last Year: Never true  Transportation Needs: No Transportation Needs (10/20/2022)   PRAPARE - Hydrologist (Medical): No    Lack of Transportation (Non-Medical): No  Physical Activity: Not on file  Stress: Not on file  Social Connections: Not on file  Intimate Partner Violence: Not At Risk (10/20/2022)   Humiliation, Afraid, Rape, and Kick questionnaire    Fear of Current or Ex-Partner: No    Emotionally Abused: No    Physically Abused: No    Sexually Abused: No   Family History  Problem Relation Age of Onset   Anuerysm Father    Rheum arthritis Sister    Healthy Sister    COPD Sister    Healthy Brother    Cancer Other    Colon cancer Neg Hx       VITAL SIGNS BP (!) 101/56   Pulse (!) 52   Temp (!) 97 F (36.1 C)   Resp 20   Ht '5\' 1"'$  (1.549 m)   Wt 106 lb (48.1 kg)   SpO2 96%   BMI 20.03 kg/m   Outpatient Encounter Medications as of 11/10/2022  Medication Sig Note   acetaminophen (TYLENOL) 500 MG tablet Take 1 tablet (500 mg total) by mouth 4 (four) times daily -  with meals and at bedtime. (Patient taking differently: Take 500 mg by mouth every 6 (six) hours as needed for moderate pain.)    ALPRAZolam (XANAX) 0.5 MG tablet Take 2 tablets (1 mg total) by mouth at bedtime.    amLODipine (NORVASC) 10 MG tablet Place 1 tablet (10 mg total) into feeding tube daily.    dexamethasone (DECADRON) 2 MG tablet Place 1 tablet (2 mg total) into feeding tube every 12 (twelve) hours.    DULoxetine (CYMBALTA) 30 MG capsule Take 30 mg by mouth every morning. Take with a 60 mg capsule for a total of 90 mg daily    DULoxetine (CYMBALTA) 60 MG capsule Take 60 mg by mouth every morning. T 10/10/2022: Take with a 30  mg capsule for a total of 90 mg daily    famotidine (PEPCID) 20 MG tablet Place 1 tablet (20 mg total) into feeding tube daily.    gabapentin (NEURONTIN) 250 MG/5ML solution Place 2 mLs (100 mg total) into feeding tube 2 (two) times daily at 10 am and 4 pm.    gabapentin (NEURONTIN) 250 MG/5ML solution Place 4 mLs (200 mg total) into feeding tube at bedtime.    HYDROcodone-acetaminophen (NORCO/VICODIN) 5-325 MG tablet Take 1 tablet by mouth every 4 (four) hours as needed for moderate pain ((score 4 to  6)).    levothyroxine (SYNTHROID) 75 MCG tablet Take 1 tablet (75 mcg total) by mouth daily before breakfast.    loperamide (IMODIUM) 2 MG capsule TAKE ONE CAPSULE ('2MG'$  TOTAL) BY MOUTH FOUR TIMES DAILY AS NEEDED FOR DIARRHEA OR LOOSE STOOLS (Patient taking differently: Take 2 mg by mouth 4 (four) times daily as needed for diarrhea or loose stools.)    Meth-Hyo-M Bl-Na Phos-Ph Sal (URIBEL) 118 MG CAPS Take 1 capsule (118 mg total) by mouth 4 (four) times daily as needed (pain with urination).    methocarbamol (ROBAXIN) 750 MG tablet Take 1 tablet (750 mg total) by mouth 4 (four) times daily. (Patient taking differently: Take 750 mg by mouth every 6 (six) hours as needed for muscle spasms.)    Metoprolol Tartrate 75 MG TABS Place 75 mg into feeding tube 2 (two) times daily.    mirabegron ER (MYRBETRIQ) 50 MG TB24 tablet Take 1 tablet (50 mg total) by mouth daily.    Multiple Vitamins-Iron (MULTIVITAMINS WITH IRON) TABS tablet Take 1 tablet by mouth every morning. 10/10/2022: Flinstone vitamin    Nutritional Supplements (FEEDING SUPPLEMENT, JEVITY 1.2 CAL,) LIQD Place 1,000 mLs into feeding tube continuous. Run at 55 cc/hr starting AM 11/01/22    Protein (FEEDING SUPPLEMENT, PROSOURCE TF20,) liquid Place 60 mLs into feeding tube daily.    sodium bicarbonate 650 MG tablet Take 650 mg by mouth 2 (two) times daily.    No facility-administered encounter medications on file as of 11/10/2022.      SIGNIFICANT DIAGNOSTIC EXAMS  LABS REVIEWED: TODAY  11-06-22; wbc 10.6; hgb 10.7; hct 33.7; mcv 86.4 plt 187; glucose 114; bun 62; creat 1.26; k+ 5.9; na++ 133; ca 8.5; gfr 39; mag 2.5; phos 5.1  11-10-22: glucose 104; bun 72; creat 1.19; k+ 5.8; na++ 132; ca 8.4; gfr 42; phos 4.8; albumin 3.3; mag 2.6   Review of Systems  Constitutional:  Positive for malaise/fatigue.  Respiratory:  Negative for cough and shortness of breath.   Cardiovascular:  Negative for chest pain, palpitations and leg swelling.  Gastrointestinal:  Negative for abdominal pain, constipation and heartburn.  Musculoskeletal:  Positive for myalgias. Negative for back pain and joint pain.  Skin: Negative.   Neurological:  Negative for dizziness.  Psychiatric/Behavioral:  The patient is not nervous/anxious.    Physical Exam Constitutional:      General: She is not in acute distress.    Appearance: She is underweight. She is ill-appearing. She is not diaphoretic.  Neck:     Thyroid: No thyromegaly.  Cardiovascular:     Rate and Rhythm: Normal rate and regular rhythm.     Pulses: Normal pulses.     Heart sounds: Normal heart sounds.  Pulmonary:     Effort: Pulmonary effort is normal. No respiratory distress.     Breath sounds: Normal breath sounds.  Abdominal:     General: Bowel sounds are normal. There is no distension.     Palpations: Abdomen is soft.     Tenderness: There is no abdominal tenderness.     Comments: Peg tube present   Musculoskeletal:        General: Normal range of motion.     Cervical back: Neck supple.     Right lower leg: No edema.     Left lower leg: No edema.  Lymphadenopathy:     Cervical: No cervical adenopathy.  Skin:    General: Skin is warm and dry.  Neurological:     Mental Status: She  is alert and oriented to person, place, and time.  Psychiatric:        Mood and Affect: Mood normal.       ASSESSMENT/  PLAN:  TODAY  Hyponatremia Hyperkalemia Hypermagnesemia CKD (chronic kidney disease) stage 4 Hypocalcemia Hypoalbuminemia due to protein calorie malnutrition   Will stop bicarbonate Will begin nacl 1 gm twice daily Tums 750 mg three times daily NS at 75 cc per hour for one liter Prostat 30 mL three times daily Will give lokelma 10 gm today  Will repeat labs in the AM       Ok Edwards NP [facility]  call [phone]

## 2022-11-11 ENCOUNTER — Encounter: Payer: Self-pay | Admitting: Adult Health

## 2022-11-11 ENCOUNTER — Non-Acute Institutional Stay (SKILLED_NURSING_FACILITY): Payer: Medicare Other | Admitting: Adult Health

## 2022-11-11 ENCOUNTER — Other Ambulatory Visit (HOSPITAL_COMMUNITY)
Admission: RE | Admit: 2022-11-11 | Discharge: 2022-11-11 | Disposition: A | Discharge status: Home / Self Care | Payer: Medicare Other | Source: Skilled Nursing Facility | Attending: Adult Health | Admitting: Adult Health

## 2022-11-11 ENCOUNTER — Ambulatory Visit (INDEPENDENT_AMBULATORY_CARE_PROVIDER_SITE_OTHER): Payer: Medicare Other | Admitting: Urology

## 2022-11-11 ENCOUNTER — Encounter: Payer: Self-pay | Admitting: Urology

## 2022-11-11 VITALS — BP 111/69 | HR 52 | Ht 61.0 in | Wt 105.0 lb

## 2022-11-11 DIAGNOSIS — N1330 Unspecified hydronephrosis: Secondary | ICD-10-CM

## 2022-11-11 DIAGNOSIS — C670 Malignant neoplasm of trigone of bladder: Secondary | ICD-10-CM | POA: Diagnosis not present

## 2022-11-11 DIAGNOSIS — N184 Chronic kidney disease, stage 4 (severe): Secondary | ICD-10-CM

## 2022-11-11 DIAGNOSIS — E875 Hyperkalemia: Secondary | ICD-10-CM

## 2022-11-11 DIAGNOSIS — E871 Hypo-osmolality and hyponatremia: Secondary | ICD-10-CM | POA: Diagnosis not present

## 2022-11-11 DIAGNOSIS — I132 Hypertensive heart and chronic kidney disease with heart failure and with stage 5 chronic kidney disease, or end stage renal disease: Secondary | ICD-10-CM | POA: Insufficient documentation

## 2022-11-11 DIAGNOSIS — Z87440 Personal history of urinary (tract) infections: Secondary | ICD-10-CM | POA: Diagnosis not present

## 2022-11-11 LAB — RENAL FUNCTION PANEL
Albumin: 3.1 g/dL — ABNORMAL LOW (ref 3.5–5.0)
Anion gap: 6 (ref 5–15)
BUN: 71 mg/dL — ABNORMAL HIGH (ref 8–23)
CO2: 23 mmol/L (ref 22–32)
Calcium: 8.1 mg/dL — ABNORMAL LOW (ref 8.9–10.3)
Chloride: 106 mmol/L (ref 98–111)
Creatinine, Ser: 1.06 mg/dL — ABNORMAL HIGH (ref 0.44–1.00)
GFR, Estimated: 48 mL/min — ABNORMAL LOW (ref >60)
Glucose, Bld: 117 mg/dL — ABNORMAL HIGH (ref 70–99)
Phosphorus: 5 mg/dL — ABNORMAL HIGH (ref 2.5–4.6)
Potassium: 5.6 mmol/L — ABNORMAL HIGH (ref 3.5–5.1)
Sodium: 135 mmol/L (ref 135–145)

## 2022-11-11 LAB — MAGNESIUM: Magnesium: 2.5 mg/dL — ABNORMAL HIGH (ref 1.7–2.4)

## 2022-11-11 LAB — CORTISOL: Cortisol, Plasma: 1 ug/dL

## 2022-11-11 NOTE — Progress Notes (Unsigned)
Location:  [facility] Room Number: 322 Place of Service:  SNF (31)   CODE STATUS: dnr   Allergies  Allergen Reactions   Codeine Nausea And Vomiting    Chief Complaint  Patient presents with   Acute Visit    Follow up lab results     HPI:  She continues to have electrolyte disturbances. Her cortisol level has come back significantly low. She continues to be tube fed without po intake. Her cortisol level is low at 1.0 concerning for adrenal insufficieny   Past Medical History:  Diagnosis Date   Abdominal adhesions 1994   Allergic rhinitis    Anemia    Anxiety and depression    Aortic stenosis    Arthritis    Atrial fibrillation ([place]) 10/2012   Associated with severe anemia and esophageal pill impaction   Breast carcinoma (HCC)    Right mastectomy   Cholelithiasis    Essential hypertension    Gastroesophageal reflux disease    Hiatal hernia   History of blood transfusion    Hypothyroidism    Low back pain    Malabsorption    Short gut syndrome following small bowel resection surgery x2   Nephrolithiasis 2004   Painless hematuria   Short gut syndrome    Bowel resection , 2004   Upper GI bleed 2004   Multiple episodes of melena-? due to gastritis or adverse drug effect (nonsteroidals, small bowel ulceration with Fosamax); caused by Pepto-Bismol during one Emergency Department evaluation    Past Surgical History:  Procedure Laterality Date   ABDOMINAL HYSTERECTOMY  12/15/1958   massive gynecologic bleeding post delivery   ANTERIOR CERVICAL DECOMP/DISCECTOMY FUSION N/A 10/17/2022   Procedure: Anterior Cervical Discectomy and Fusion Cervical Three-Four/Cervical Four-Five;  Surgeon: Earnie Larsson, MD;  Location: [facility];  Service: Neurosurgery;  Laterality: N/A;  3C   BOWEL RESECTION     Resulting short gut syndrome   CHOLECYSTECTOMY N/A 06/25/2018   Procedure: LAPAROSCOPIC CHOLECYSTECTOMY WITH INTRAOPERATIVE CHOLANGIOGRAM;  Surgeon: Armandina Gemma, MD;  [facility];  Service: General;  Laterality: N/A;   COLONOSCOPY W/ POLYPECTOMY  12/16/2003   Lipoma; diverticulosis   COLONOSCOPY WITH ESOPHAGOGASTRODUODENOSCOPY (EGD)  11/22/2012   Re[facility]   CYSTOSCOPY W/ RETROGRADES Bilateral 04/16/2022   Procedure: CYSTOSCOPY;  Surgeon: Primus Bravo., MD;  [facility];  Service: Urology;  Laterality: Bilateral;   ESOPHAGOGASTRODUODENOSCOPY (EGD) WITH PROPOFOL N/A 04/04/2022   Procedure: ESOPHAGOGASTRODUODENOSCOPY (EGD) WITH PROPOFOL;  Surgeon: Rogene Houston, MD;  [facility];  Service: Endoscopy;  Laterality: N/A;  210   HIP ARTHROPLASTY Right 06/15/2020   Procedure: ANTERIOR ARTHROPLASTY BIPOLAR HIP (HEMIARTHROPLASTY);  Surgeon: Mcarthur Rossetti, MD;  Location: [facility];  Service: Orthopedics;  Laterality: Right;   IR GASTROSTOMY TUBE MOD SED  10/29/2022   LAPAROSCOPIC LYSIS OF ADHESIONS  12/16/1963   s/p adhesions   MASTECTOMY Right    Carcinoma of the breast; right   TRANSURETHRAL RESECTION OF BLADDER TUMOR N/A 04/16/2022   Procedure: TRANSURETHRAL RESECTION OF BLADDER TUMOR (TURBT);  Surgeon: Primus Bravo., MD;  [facility];  Service: Urology;  Laterality: N/A;   UPPER GASTROINTESTINAL ENDOSCOPY      Social History   Socioeconomic History   Marital status: Widowed    Spouse name: Not on file   Number of children: Not on file   Years of education: 12   Highest education level: Not on file  Occupational History   Not on file  Tobacco Use   Smoking status: Former    Packs/day: 1.50    Years: 20.00    Total pack years: 30.00    Types: Cigarettes    Passive exposure: Never   Smokeless tobacco: Never  Vaping Use   Vaping Use: Never used  Substance and Sexual Activity   Alcohol use: No   Drug use: No   Sexual activity: Not Currently    Birth control/protection: Post-menopausal, Surgical    Comment: hyst  Other Topics Concern   Not on file  Social History Narrative   Not on  file   Social Determinants of Health   Financial Resource Strain: Not on file  Food Insecurity: No Food Insecurity (10/20/2022)   Hunger Vital Sign    Worried About Running Out of Food in the Last Year: Never true    Ran Out of Food in the Last Year: Never true  Transportation Needs: No Transportation Needs (10/20/2022)   PRAPARE - Hydrologist (Medical): No    Lack of Transportation (Non-Medical): No  Physical Activity: Not on file  Stress: Not on file  Social Connections: Not on file  Intimate Partner Violence: Not At Risk (10/20/2022)   Humiliation, Afraid, Rape, and Kick questionnaire    Fear of Current or Ex-Partner: No    Emotionally Abused: No    Physically Abused: No    Sexually Abused: No   Family History  Problem Relation Age of Onset   Anuerysm Father    Rheum arthritis Sister    Healthy Sister    COPD Sister    Healthy Brother    Cancer Other    Colon cancer Neg Hx       VITAL SIGNS BP (!) 120/55   Pulse 70   Temp 97.8 F (36.6 C)   Resp 18   Ht '5\' 1"'$  (1.549 m)   Wt 106 lb (48.1 kg)   SpO2 94%   BMI 20.03 kg/m   Outpatient Encounter Medications as of 11/11/2022  Medication Sig Note   acetaminophen (TYLENOL) 500 MG tablet Take 1 tablet (500 mg total) by mouth 4 (four) times daily -  with meals and at bedtime. (Patient taking differently: Take 500 mg by mouth every 6 (six) hours as needed for moderate pain.)    ALPRAZolam (XANAX) 0.5 MG tablet Take 2 tablets (1 mg total) by mouth at bedtime.    amLODipine (NORVASC) 10 MG tablet Place 1 tablet (10 mg total) into feeding tube daily.    dexamethasone (DECADRON) 2 MG tablet Place 1 tablet (2 mg total) into feeding tube every 12 (twelve) hours.    DULoxetine (CYMBALTA) 30 MG capsule Take 30 mg by mouth every morning. Take with a 60 mg capsule for a total of 90 mg daily    DULoxetine (CYMBALTA) 60 MG capsule Take 60 mg by mouth every morning. T 10/10/2022: Take with a 30 mg capsule  for a total of 90 mg daily    famotidine (PEPCID) 20 MG tablet Place 1 tablet (20 mg total) into feeding tube daily.    gabapentin (NEURONTIN) 250 MG/5ML solution Place 2 mLs (100 mg total) into feeding tube 2 (two) times daily at 10 am and 4 pm.    gabapentin (NEURONTIN) 250 MG/5ML solution Place 4 mLs (200 mg total) into feeding tube at bedtime.    HYDROcodone-acetaminophen (NORCO/VICODIN) 5-325 MG tablet Take 1 tablet by mouth every 4 (four) hours as needed for moderate pain ((score 4 to 6)).  levothyroxine (SYNTHROID) 75 MCG tablet Take 1 tablet (75 mcg total) by mouth daily before breakfast.    loperamide (IMODIUM) 2 MG capsule TAKE ONE CAPSULE ('2MG'$  TOTAL) BY MOUTH FOUR TIMES DAILY AS NEEDED FOR DIARRHEA OR LOOSE STOOLS (Patient taking differently: Take 2 mg by mouth 4 (four) times daily as needed for diarrhea or loose stools.)    Meth-Hyo-M Bl-Na Phos-Ph Sal (URIBEL) 118 MG CAPS Take 1 capsule (118 mg total) by mouth 4 (four) times daily as needed (pain with urination).    methocarbamol (ROBAXIN) 750 MG tablet Take 1 tablet (750 mg total) by mouth 4 (four) times daily. (Patient taking differently: Take 750 mg by mouth every 6 (six) hours as needed for muscle spasms.)    Metoprolol Tartrate 75 MG TABS Place 75 mg into feeding tube 2 (two) times daily.    mirabegron ER (MYRBETRIQ) 50 MG TB24 tablet Take 1 tablet (50 mg total) by mouth daily.    Multiple Vitamins-Iron (MULTIVITAMINS WITH IRON) TABS tablet Take 1 tablet by mouth every morning. 10/10/2022: Flinstone vitamin    Nutritional Supplements (FEEDING SUPPLEMENT, JEVITY 1.2 CAL,) LIQD Place 1,000 mLs into feeding tube continuous. Run at 55 cc/hr starting AM 11/01/22    Protein (FEEDING SUPPLEMENT, PROSOURCE TF20,) liquid Place 60 mLs into feeding tube daily.    sodium bicarbonate 650 MG tablet Take 650 mg by mouth 2 (two) times daily.    No facility-administered encounter medications on file as of 11/11/2022.     SIGNIFICANT  DIAGNOSTIC EXAMS   LABS REVIEWED: TODAY  11-06-22; wbc 10.6; hgb 10.7; hct 33.7; mcv 86.4 plt 187; glucose 114; bun 62; creat 1.26; k+ 5.9; na++ 133; ca 8.5; gfr 39; mag 2.5; phos 5.1  11-10-22: glucose 104; bun 72; creat 1.19; k+ 5.8; na++ 132; ca 8.4; gfr 42; phos 4.8; albumin 3.3; mag 2.6 11-11-22: glucose 117; bun 71; creat 1.06 ;k+ 5.6; na++ 135; ca 8.1; gfr 48; phos 5.0; albumin 3.1; mag 2.5; cortisol 1.0    Review of Systems  Constitutional:  Negative for malaise/fatigue.  Respiratory:  Negative for cough and shortness of breath.   Cardiovascular:  Negative for chest pain, palpitations and leg swelling.  Gastrointestinal:  Negative for abdominal pain, constipation and heartburn.  Musculoskeletal:  Negative for back pain, joint pain and myalgias.  Skin: Negative.   Neurological:  Negative for dizziness.  Psychiatric/Behavioral:  The patient is not nervous/anxious.    Physical Exam Constitutional:      General: She is not in acute distress.    Appearance: She is underweight. She is not diaphoretic.  Neck:     Thyroid: No thyromegaly.  Cardiovascular:     Rate and Rhythm: Normal rate and regular rhythm.     Heart sounds: Normal heart sounds.  Pulmonary:     Effort: Pulmonary effort is normal. No respiratory distress.     Breath sounds: Normal breath sounds.  Abdominal:     General: Bowel sounds are normal. There is no distension.     Palpations: Abdomen is soft.     Tenderness: There is no abdominal tenderness.     Comments: Peg tube present   Musculoskeletal:        General: Normal range of motion.     Cervical back: Neck supple.     Right lower leg: No edema.     Left lower leg: No edema.  Lymphadenopathy:     Cervical: No cervical adenopathy.  Skin:    General: Skin is warm and dry.  Neurological:     Mental Status: She is alert. Mental status is at baseline.  Psychiatric:        Mood and Affect: Mood normal.      ASSESSMENT/  PLAN:  TODAY  Hypermagnesemia Hyperkalemia Hyponatremia CKD (chronic kidney disease) stage 4   Will give NS at 75 cc per hour for one liter Will give lokelma 10 gm twice today In AM will repeat renal panel and [place] NP [facility] Adult Medicine   call [phone]

## 2022-11-11 NOTE — Progress Notes (Signed)
Assessment: 1. 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 History of UTI   3. Hydronephrosis of left kidney - likely secondary to muscle invasive bladder cancer     Plan: I reviewed the patient's chart including notes from her recent hospitalization, lab results, and imaging results. I personally viewed the CT study from 10/14/2022 with results as noted below. I discussed these findings with the patient and her niece today.  She is not symptomatic in regards to the left hydronephrosis.  Her renal function is stable.  If she became symptomatic or had worsening renal function I would recommend repeat imaging.  If the left hydronephrosis worsens, placement of a nephrostomy tube would be the preferred option for management. Resume fosfomycin 3 g p.o. every 10 days for UTI prevention. Resume Myrbetriq 50 mg p.o. daily when able to take p.o. meds. Return to office in 2 months.  Chief Complaint: Chief Complaint  Patient presents with   Bladder Cancer    HPI: Jane Andrews is a 88 y.o. female who presents for continued evaluation of muscle invasive high grade bladder cancer.   She was initially seen in January 2023 for dysuria.  Her symptoms had been present for approximately 2 months.   Urine culture from 12/21/2021 grew 50-100 K mixed flora.   MDX culture from 01/07/2022 showed no organisms. Urine culture from 03/29/2022 grew 10-20 5K mixed flora. No gross hematuria.  She does have urgency, frequency. She continued to have intermittent dysuria, frequency, and incontinence.  No gross hematuria or flank pain. CT renal stone study from 01/26/2022 showed a stable staghorn calculus in the right kidney, tiny left lower pole renal calculi, and a cystic area in the right pelvis/adnexa. Cystoscopy from 4/23 demonstrated a sessile appearing bladder  mass in the trigone area. Urine cytology showed dysplastic cells and was FISH positive. She underwent cystoscopy with transurethral resection of the bladder tumor on 04/16/2022.  The UOs were unable to be identified during the procedure.  Pathology showed high-grade urothelial carcinoma with squamous cell component (95%) with invasion of the muscularis propria.  She was discharged from the hospital on postoperative day #2.  Her creatinine remained fairly stable at 2.1 at the time of discharge.  At her visit on 04/23/2022, she was not having any gross hematuria at the present time.  She was voiding frequently and continued to have dysuria and bladder spasms.  No flank pain or abdominal pain.  No nausea or vomiting.  She had completed her antibiotics. Creatinine from 04/23/2022 stable at 1.87. PET scan from 05/02/2022 showed irregular contour of the bladder consistent with the known bladder cancer, no evidence of metastatic disease, no evidence of ureteral obstruction. She underwent further management with palliative radiation therapy, completing a total of 8 treatments on 06/05/22.   She fell at home and sustained a head injury with intracranial hemorrhage.  She was admitted to the hospital and subsequently to inpatient rehab.  She is now back at home. She has been voiding spontaneously.  She reported some improvement in her dysuria. She continued to use Uribel prn and Myrbetriq 25 mg daily. She was evaluated for increased dysuria.  A urinalysis from 06/24/2022 showed >30 WBCs, >30 RBCs, and moderate bacteria.  Urine culture grew 50-100 K of >2 organisms.  She was treated with cefdinir. She continued to have urinary symptoms as well as an abnormal  urinalysis. Resolved MDX urine culture from 07/16/2022 grew Enterococcus, E. coli, and Klebsiella.  She was treated with a combination of fosfomycin and Bactrim. She completed the fosfomycin and Bactrim and noted improvement in her bladder symptoms following the  antibiotics.  She was not having significant dysuria.  No gross hematuria or flank pain.  She continued with frequency, urgency, and incontinence.  She noticed improvement in her symptoms with the higher dose of Myrbetriq.  The patient did not wish to undergo cystoscopy at the time of her last visit as she was doing well symptomatically.  She continued on fosfomycin every 10 days for UTI prevention and Myrbetriq 50 mg daily.  CT abdomen and pelvis without contrast from 10/14/2022 showed fullness of the left ureter with mild left hydronephrosis, large staghorn calculus in the lower right kidney unchanged from prior imaging, enlarging intermediate density lesion in the lower pole of the right kidney felt to be consistent with a hemorrhagic cyst, and a cystic right adnexal lesion stable in size.  She was recently admitted to the hospital on 10/19/2022 for pneumonia.  She had a G-tube placed during the admission as well.  She is currently in a skilled nursing facility. Creatinine from 11/11/2022: 1.06.  She returns today for follow-up.  She used to recover from her C-spine surgery and her recent aspiration pneumonia.  She is not having any significant bladder pain.  She does have occasional dysuria.  Review of her chart shows that she is not currently taking fosfomycin or Myrbetriq.  No flank pain or gross hematuria.   Portions of the above documentation were copied from a prior visit for review purposes only.  Allergies: Allergies  Allergen Reactions   Codeine Nausea And Vomiting    PMH: Past Medical History:  Diagnosis Date   Abdominal adhesions 1994   Allergic rhinitis    Anemia    Anxiety and depression    Aortic stenosis    Arthritis    Atrial fibrillation ([place]) 10/2012   Associated with severe anemia and esophageal pill impaction   Breast carcinoma (HCC)    Right mastectomy   Cholelithiasis    Essential hypertension    Gastroesophageal reflux disease    Hiatal hernia   History  of blood transfusion    Hypothyroidism    Low back pain    Malabsorption    Short gut syndrome following small bowel resection surgery x2   Nephrolithiasis 2004   Painless hematuria   Short gut syndrome    Bowel resection , 2004   Upper GI bleed 2004   Multiple episodes of melena-? due to gastritis or adverse drug effect (nonsteroidals, small bowel ulceration with Fosamax); caused by Pepto-Bismol during one Emergency Department evaluation    PSH: Past Surgical History:  Procedure Laterality Date   ABDOMINAL HYSTERECTOMY  12/15/1958   massive gynecologic bleeding post delivery   ANTERIOR CERVICAL DECOMP/DISCECTOMY FUSION N/A 10/17/2022   Procedure: Anterior Cervical Discectomy and Fusion Cervical Three-Four/Cervical Four-Five;  Surgeon: Earnie Larsson, MD;  Location: [facility];  Service: Neurosurgery;  Laterality: N/A;  3C   BOWEL RESECTION     Resulting short gut syndrome   CHOLECYSTECTOMY N/A 06/25/2018   Procedure: LAPAROSCOPIC CHOLECYSTECTOMY WITH INTRAOPERATIVE CHOLANGIOGRAM;  Surgeon: Armandina Gemma, MD;  Location: [facility];  Service: General;  Laterality: N/A;   COLONOSCOPY W/ POLYPECTOMY  12/16/2003   Lipoma; diverticulosis   COLONOSCOPY WITH ESOPHAGOGASTRODUODENOSCOPY (EGD)  11/22/2012   [facility]   CYSTOSCOPY W/ RETROGRADES Bilateral 04/16/2022   Procedure: CYSTOSCOPY;  Surgeon: Primus Bravo., MD;  Location: [facility];  Service: Urology;  Laterality: Bilateral;   ESOPHAGOGASTRODUODENOSCOPY (EGD) WITH PROPOFOL N/A 04/04/2022   Procedure: ESOPHAGOGASTRODUODENOSCOPY (EGD) WITH PROPOFOL;  Surgeon: Rogene Houston, MD;  [facility];  Service: Endoscopy;  Laterality: N/A;  210   HIP ARTHROPLASTY Right 06/15/2020   Procedure: ANTERIOR ARTHROPLASTY BIPOLAR HIP (HEMIARTHROPLASTY);  Surgeon: Mcarthur Rossetti, MD;  Location: [facility];  Service: Orthopedics;  Laterality: Right;   IR GASTROSTOMY TUBE MOD SED  10/29/2022   LAPAROSCOPIC LYSIS OF ADHESIONS  12/16/1963   s/p  adhesions   MASTECTOMY Right    Carcinoma of the breast; right   TRANSURETHRAL RESECTION OF BLADDER TUMOR N/A 04/16/2022   Procedure: TRANSURETHRAL RESECTION OF BLADDER TUMOR (TURBT);  Surgeon: Primus Bravo., MD;  Location: [facility];  Service: Urology;  Laterality: N/A;   UPPER GASTROINTESTINAL ENDOSCOPY      SH: Social History   Tobacco Use   Smoking status: Former    Packs/day: 1.50    Years: 20.00    Total pack years: 30.00    Types: Cigarettes    Passive exposure: Never   Smokeless tobacco: Never  Vaping Use   Vaping Use: Never used  Substance Use Topics   Alcohol use: No   Drug use: No    ROS: Constitutional:  Negative for fever, chills, weight loss CV: Negative for chest pain, previous MI, hypertension Respiratory:  Negative for shortness of breath, wheezing, sleep apnea, frequent cough GI:  Negative for nausea, vomiting, bloody stool, GERD  PE: BP 111/69   Pulse (!) 52   Ht '5\' 1"'$  (1.549 m)   Wt 105 lb (47.6 kg)   BMI 19.84 kg/m  GENERAL APPEARANCE:  Ill appearing, female in wheelchair, NAD HEENT:  Atraumatic, normocephalic, oropharynx clear NECK:  C collar in place ABDOMEN:  Soft, non-tender, no masses EXTREMITIES:  Without clubbing, cyanosis, or edema NEUROLOGIC:  Alert and oriented x 3,  CN II-XII grossly intact MENTAL STATUS:  appropriate BACK:  Non-tender to palpation, No CVAT SKIN:  Warm, dry, and intact   Results: None

## 2022-11-12 ENCOUNTER — Non-Acute Institutional Stay (SKILLED_NURSING_FACILITY): Payer: Medicare Other | Admitting: Adult Health

## 2022-11-12 ENCOUNTER — Other Ambulatory Visit (HOSPITAL_COMMUNITY): Payer: Self-pay | Admitting: Adult Health

## 2022-11-12 ENCOUNTER — Other Ambulatory Visit (HOSPITAL_COMMUNITY)
Admission: RE | Admit: 2022-11-12 | Discharge: 2022-11-12 | Disposition: A | Discharge status: Home / Self Care | Payer: Medicare Other | Source: Skilled Nursing Facility | Attending: Adult Health | Admitting: Adult Health

## 2022-11-12 ENCOUNTER — Encounter: Payer: Self-pay | Admitting: Adult Health

## 2022-11-12 DIAGNOSIS — E271 Primary adrenocortical insufficiency: Secondary | ICD-10-CM | POA: Diagnosis not present

## 2022-11-12 DIAGNOSIS — N184 Chronic kidney disease, stage 4 (severe): Secondary | ICD-10-CM

## 2022-11-12 DIAGNOSIS — R739 Hyperglycemia, unspecified: Secondary | ICD-10-CM | POA: Insufficient documentation

## 2022-11-12 DIAGNOSIS — E875 Hyperkalemia: Secondary | ICD-10-CM

## 2022-11-12 DIAGNOSIS — E871 Hypo-osmolality and hyponatremia: Secondary | ICD-10-CM | POA: Insufficient documentation

## 2022-11-12 DIAGNOSIS — T50905A Adverse effect of unspecified drugs, medicaments and biological substances, initial encounter: Secondary | ICD-10-CM | POA: Insufficient documentation

## 2022-11-12 DIAGNOSIS — R488 Other symbolic dysfunctions: Secondary | ICD-10-CM | POA: Insufficient documentation

## 2022-11-12 DIAGNOSIS — R1314 Dysphagia, pharyngoesophageal phase: Secondary | ICD-10-CM | POA: Insufficient documentation

## 2022-11-12 DIAGNOSIS — Z431 Encounter for attention to gastrostomy: Secondary | ICD-10-CM | POA: Insufficient documentation

## 2022-11-12 DIAGNOSIS — I132 Hypertensive heart and chronic kidney disease with heart failure and with stage 5 chronic kidney disease, or end stage renal disease: Secondary | ICD-10-CM | POA: Insufficient documentation

## 2022-11-12 LAB — RENAL FUNCTION PANEL
Albumin: 3.1 g/dL — ABNORMAL LOW (ref 3.5–5.0)
Anion gap: 10 (ref 5–15)
BUN: 72 mg/dL — ABNORMAL HIGH (ref 8–23)
CO2: 23 mmol/L (ref 22–32)
Calcium: 8.4 mg/dL — ABNORMAL LOW (ref 8.9–10.3)
Chloride: 105 mmol/L (ref 98–111)
Creatinine, Ser: 0.96 mg/dL (ref 0.44–1.00)
GFR, Estimated: 54 mL/min — ABNORMAL LOW (ref >60)
Glucose, Bld: 109 mg/dL — ABNORMAL HIGH (ref 70–99)
Phosphorus: 4.2 mg/dL (ref 2.5–4.6)
Potassium: 5.3 mmol/L — ABNORMAL HIGH (ref 3.5–5.1)
Sodium: 138 mmol/L (ref 135–145)

## 2022-11-12 LAB — MAGNESIUM: Magnesium: 2.4 mg/dL (ref 1.7–2.4)

## 2022-11-12 NOTE — Progress Notes (Signed)
Location:  [facility] Room Number: 277 Place of Service:  SNF (31)   CODE STATUS: dnr   Allergies  Allergen Reactions   Codeine Nausea And Vomiting    Chief Complaint  Patient presents with   Acute Visit    Follow up status     HPI:  She continues to have electrolyte disturbances. Her k+ remains elevated at 5.3. her mag has returned to normal levels after receiving IVF. She continues to have soft blood pressure readings; has complaints of fatigue present. Her cortisol level is 1.0; will need further workup.   Past Medical History:  Diagnosis Date   Abdominal adhesions 1994   Allergic rhinitis    Anemia    Anxiety and depression    Aortic stenosis    Arthritis    Atrial fibrillation ([place]) 10/2012   Associated with severe anemia and esophageal pill impaction   Breast carcinoma (HCC)    Right mastectomy   Cholelithiasis    Essential hypertension    Gastroesophageal reflux disease    Hiatal hernia   History of blood transfusion    Hypothyroidism    Low back pain    Malabsorption    Short gut syndrome following small bowel resection surgery x2   Nephrolithiasis 2004   Painless hematuria   Short gut syndrome    Bowel resection , 2004   Upper GI bleed 2004   Multiple episodes of melena-? due to gastritis or adverse drug effect (nonsteroidals, small bowel ulceration with Fosamax); caused by Pepto-Bismol during one Emergency Department evaluation    Past Surgical History:  Procedure Laterality Date   ABDOMINAL HYSTERECTOMY  12/15/1958   massive gynecologic bleeding post delivery   ANTERIOR CERVICAL DECOMP/DISCECTOMY FUSION N/A 10/17/2022   Procedure: Anterior Cervical Discectomy and Fusion Cervical Three-Four/Cervical Four-Five;  Surgeon: Earnie Larsson, MD;  Location: [facility];  Service: Neurosurgery;  Laterality: N/A;  3C   BOWEL RESECTION     Resulting short gut syndrome   CHOLECYSTECTOMY N/A 06/25/2018   Procedure: LAPAROSCOPIC  CHOLECYSTECTOMY WITH INTRAOPERATIVE CHOLANGIOGRAM;  Surgeon: Armandina Gemma, MD;  [facility];  Service: General;  Laterality: N/A;   COLONOSCOPY W/ POLYPECTOMY  12/16/2003   Lipoma; diverticulosis   COLONOSCOPY WITH ESOPHAGOGASTRODUODENOSCOPY (EGD)  11/22/2012   [facility]   CYSTOSCOPY W/ RETROGRADES Bilateral 04/16/2022   Procedure: CYSTOSCOPY;  Surgeon: Primus Bravo., MD;  Location[facility];  Service: Urology;  Laterality: Bilateral;   ESOPHAGOGASTRODUODENOSCOPY (EGD) WITH PROPOFOL N/A 04/04/2022   Procedure: ESOPHAGOGASTRODUODENOSCOPY (EGD) WITH PROPOFOL;  Surgeon: Rogene Houston, MD;  [facility];  Service: Endoscopy;  Laterality: N/A;  210   HIP ARTHROPLASTY Right 06/15/2020   Procedure: ANTERIOR ARTHROPLASTY BIPOLAR HIP (HEMIARTHROPLASTY);  Surgeon: Mcarthur Rossetti, MD;  [facility];  Service: Orthopedics;  Laterality: Right;   IR GASTROSTOMY TUBE MOD SED  10/29/2022   LAPAROSCOPIC LYSIS OF ADHESIONS  12/16/1963   s/p adhesions   MASTECTOMY Right    Carcinoma of the breast; right   TRANSURETHRAL RESECTION OF BLADDER TUMOR N/A 04/16/2022   Procedure: TRANSURETHRAL RESECTION OF BLADDER TUMOR (TURBT);  Surgeon: Primus Bravo., MD;  Location[facility];  Service: Urology;  Laterality: N/A;   UPPER GASTROINTESTINAL ENDOSCOPY      Social History   Socioeconomic History   Marital status: Widowed    Spouse name: Not on file   Number of children: Not on file   Years of education: 12   Highest education level: Not on  file  Occupational History   Not on file  Tobacco Use   Smoking status: Former    Packs/day: 1.50    Years: 20.00    Total pack years: 30.00    Types: Cigarettes    Passive exposure: Never   Smokeless tobacco: Never  Vaping Use   Vaping Use: Never used  Substance and Sexual Activity   Alcohol use: No   Drug use: No   Sexual activity: Not Currently    Birth control/protection: Post-menopausal, Surgical    Comment: hyst   Other Topics Concern   Not on file  Social History Narrative   Not on file   Social Determinants of Health   Financial Resource Strain: Not on file  Food Insecurity: No Food Insecurity (10/20/2022)   Hunger Vital Sign    Worried About Running Out of Food in the Last Year: Never true    Ran Out of Food in the Last Year: Never true  Transportation Needs: No Transportation Needs (10/20/2022)   PRAPARE - Hydrologist (Medical): No    Lack of Transportation (Non-Medical): No  Physical Activity: Not on file  Stress: Not on file  Social Connections: Not on file  Intimate Partner Violence: Not At Risk (10/20/2022)   Humiliation, Afraid, Rape, and Kick questionnaire    Fear of Current or Ex-Partner: No    Emotionally Abused: No    Physically Abused: No    Sexually Abused: No   Family History  Problem Relation Age of Onset   Anuerysm Father    Rheum arthritis Sister    Healthy Sister    COPD Sister    Healthy Brother    Cancer Other    Colon cancer Neg Hx       VITAL SIGNS BP 114/67   Pulse 80   Temp 97.9 F (36.6 C)   Resp 18   Ht '5\' 1"'$  (1.549 m)   Wt 106 lb (48.1 kg)   SpO2 93%   BMI 20.03 kg/m   Outpatient Encounter Medications as of 11/12/2022  Medication Sig Note   acetaminophen (TYLENOL) 500 MG tablet Take 1 tablet (500 mg total) by mouth 4 (four) times daily -  with meals and at bedtime. (Patient taking differently: Take 500 mg by mouth every 6 (six) hours as needed for moderate pain.)    ALPRAZolam (XANAX) 0.5 MG tablet Take 2 tablets (1 mg total) by mouth at bedtime.    amLODipine (NORVASC) 10 MG tablet Place 1 tablet (10 mg total) into feeding tube daily.    dexamethasone (DECADRON) 2 MG tablet Place 1 tablet (2 mg total) into feeding tube every 12 (twelve) hours.    DULoxetine (CYMBALTA) 30 MG capsule Take 30 mg by mouth every morning. Take with a 60 mg capsule for a total of 90 mg daily    DULoxetine (CYMBALTA) 60 MG capsule Take  60 mg by mouth every morning. T 10/10/2022: Take with a 30 mg capsule for a total of 90 mg daily    famotidine (PEPCID) 20 MG tablet Place 1 tablet (20 mg total) into feeding tube daily.    gabapentin (NEURONTIN) 250 MG/5ML solution Place 2 mLs (100 mg total) into feeding tube 2 (two) times daily at 10 am and 4 pm.    gabapentin (NEURONTIN) 250 MG/5ML solution Place 4 mLs (200 mg total) into feeding tube at bedtime.    HYDROcodone-acetaminophen (NORCO/VICODIN) 5-325 MG tablet Take 1 tablet by mouth every 4 (four) hours  as needed for moderate pain ((score 4 to 6)).    levothyroxine (SYNTHROID) 75 MCG tablet Take 1 tablet (75 mcg total) by mouth daily before breakfast.    loperamide (IMODIUM) 2 MG capsule TAKE ONE CAPSULE ('2MG'$  TOTAL) BY MOUTH FOUR TIMES DAILY AS NEEDED FOR DIARRHEA OR LOOSE STOOLS (Patient taking differently: Take 2 mg by mouth 4 (four) times daily as needed for diarrhea or loose stools.)    Meth-Hyo-M Bl-Na Phos-Ph Sal (URIBEL) 118 MG CAPS Take 1 capsule (118 mg total) by mouth 4 (four) times daily as needed (pain with urination).    methocarbamol (ROBAXIN) 750 MG tablet Take 1 tablet (750 mg total) by mouth 4 (four) times daily. (Patient taking differently: Take 750 mg by mouth every 6 (six) hours as needed for muscle spasms.)    Metoprolol Tartrate 75 MG TABS Place 75 mg into feeding tube 2 (two) times daily.    mirabegron ER (MYRBETRIQ) 50 MG TB24 tablet Take 1 tablet (50 mg total) by mouth daily.    Multiple Vitamins-Iron (MULTIVITAMINS WITH IRON) TABS tablet Take 1 tablet by mouth every morning. 10/10/2022: [place] vitamin    Nutritional Supplements (FEEDING SUPPLEMENT, JEVITY 1.2 CAL,) LIQD Place 1,000 mLs into feeding tube continuous. Run at 55 cc/hr starting AM 11/01/22    Protein (FEEDING SUPPLEMENT, PROSOURCE TF20,) liquid Place 60 mLs into feeding tube daily.    sodium bicarbonate 650 MG tablet Take 650 mg by mouth 2 (two) times daily.    No facility-administered  encounter medications on file as of 11/12/2022.     SIGNIFICANT DIAGNOSTIC EXAMS  LABS REVIEWED PREVIOUS   11-06-22; wbc 10.6; hgb 10.7; hct 33.7; mcv 86.4 plt 187; glucose 114; bun 62; creat 1.26; k+ 5.9; na++ 133; ca 8.5; gfr 39; mag 2.5; phos 5.1  11-10-22: glucose 104; bun 72; creat 1.19; k+ 5.8; na++ 132; ca 8.4; gfr 42; phos 4.8; albumin 3.3; mag 2.6 11-11-22: glucose 117; bun 71; creat 1.06 ;k+ 5.6; na++ 135; ca 8.1; gfr 48; phos 5.0; albumin 3.1; mag 2.5; cortisol 1.0   TODAY  11-11-22: glucose 109; bun 72; creat 0.96; k+ 5.3; na++ 138; ca 8.4; gfr 54; phos 4.2; albuin 3.1; mag 2.4     Review of Systems  Constitutional:  Positive for malaise/fatigue.  Respiratory:  Negative for cough and shortness of breath.   Cardiovascular:  Negative for chest pain, palpitations and leg swelling.  Gastrointestinal:  Negative for abdominal pain, constipation and heartburn.  Musculoskeletal:  Negative for back pain, joint pain and myalgias.  Skin: Negative.   Neurological:  Negative for dizziness.  Psychiatric/Behavioral:  The patient is not nervous/anxious.    Physical Exam Constitutional:      General: She is not in acute distress.    Appearance: She is underweight. She is not diaphoretic.  Neck:     Thyroid: No thyromegaly.  Cardiovascular:     Rate and Rhythm: Normal rate and regular rhythm.     Pulses: Normal pulses.     Heart sounds: Normal heart sounds.  Pulmonary:     Effort: Pulmonary effort is normal. No respiratory distress.     Breath sounds: Normal breath sounds.  Abdominal:     General: Bowel sounds are normal. There is no distension.     Palpations: Abdomen is soft.     Tenderness: There is no abdominal tenderness.     Comments: Peg tube present   Musculoskeletal:        General: Normal range of motion.  Cervical back: Neck supple.     Right lower leg: No edema.     Left lower leg: No edema.  Lymphadenopathy:     Cervical: No cervical adenopathy.  Skin:     General: Skin is warm and dry.  Neurological:     Mental Status: She is alert. Mental status is at baseline.  Psychiatric:        Mood and Affect: Mood normal.      ASSESSMENT/ PLAN:  TODAY  CKD (chronic kidney disease) stage IV Hyperkalemia Addison's disease  Will get renal/adrenal ultrasound Will insert mid line Will check ACTH Will give lokemla 10 gm twice today Weill setup endocrinology consult Will check renal panel and mag level    Ok Edwards NP [facility]  call [phone]

## 2022-11-13 ENCOUNTER — Other Ambulatory Visit: Payer: Self-pay | Admitting: *Deleted

## 2022-11-13 ENCOUNTER — Other Ambulatory Visit (HOSPITAL_COMMUNITY)
Admission: RE | Admit: 2022-11-13 | Discharge: 2022-11-13 | Disposition: A | Discharge status: Home / Self Care | Payer: Medicare Other | Source: Skilled Nursing Facility | Attending: Adult Health | Admitting: Adult Health

## 2022-11-13 ENCOUNTER — Non-Acute Institutional Stay (SKILLED_NURSING_FACILITY): Payer: Medicare Other | Admitting: Adult Health

## 2022-11-13 ENCOUNTER — Encounter: Payer: Self-pay | Admitting: Adult Health

## 2022-11-13 DIAGNOSIS — E271 Primary adrenocortical insufficiency: Secondary | ICD-10-CM | POA: Diagnosis not present

## 2022-11-13 DIAGNOSIS — K529 Noninfective gastroenteritis and colitis, unspecified: Secondary | ICD-10-CM | POA: Diagnosis not present

## 2022-11-13 DIAGNOSIS — I132 Hypertensive heart and chronic kidney disease with heart failure and with stage 5 chronic kidney disease, or end stage renal disease: Secondary | ICD-10-CM | POA: Insufficient documentation

## 2022-11-13 DIAGNOSIS — N200 Calculus of kidney: Secondary | ICD-10-CM | POA: Diagnosis not present

## 2022-11-13 DIAGNOSIS — R197 Diarrhea, unspecified: Secondary | ICD-10-CM | POA: Diagnosis not present

## 2022-11-13 LAB — RENAL FUNCTION PANEL
Albumin: 2.9 g/dL — ABNORMAL LOW (ref 3.5–5.0)
Anion gap: 7 (ref 5–15)
BUN: 63 mg/dL — ABNORMAL HIGH (ref 8–23)
CO2: 21 mmol/L — ABNORMAL LOW (ref 22–32)
Calcium: 8 mg/dL — ABNORMAL LOW (ref 8.9–10.3)
Chloride: 107 mmol/L (ref 98–111)
Creatinine, Ser: 0.97 mg/dL (ref 0.44–1.00)
GFR, Estimated: 54 mL/min — ABNORMAL LOW (ref >60)
Glucose, Bld: 102 mg/dL — ABNORMAL HIGH (ref 70–99)
Phosphorus: 5 mg/dL — ABNORMAL HIGH (ref 2.5–4.6)
Potassium: 4.2 mmol/L (ref 3.5–5.1)
Sodium: 135 mmol/L (ref 135–145)

## 2022-11-13 LAB — MAGNESIUM: Magnesium: 2.3 mg/dL (ref 1.7–2.4)

## 2022-11-13 NOTE — Patient Outreach (Signed)
Mrs. Pascale resides in [facility]. Screening for potential [facility] care coordination services as a benefit of insurance plan and PCP.   Update received from [place] worker. Mrs Hollings is progressing slowly with therapy. Tube feeds will need to be changed due to multiple loose stools. Has very supportive niece. Transition plans are pending.   Will continue to follow.    Marthenia Rolling, MSN, RN,BSN [facility] Acute Care Coordinator [phone] (Direct dial)

## 2022-11-13 NOTE — Progress Notes (Signed)
Location:  [place] Room Number: 270 Place of Service:  SNF (31)   CODE STATUS: dnr   Allergies  Allergen Reactions   Codeine Nausea And Vomiting    Chief Complaint  Patient presents with   Acute Visit    Patient status     HPI:  Her k+ level remains elevated at 5.3; is slowly improving. Her cortisol level is 1.0.   she will need replacement therapy. I have sent Dr. Dorris Fetch an email regarding her status for guidance. She continues to have numerous stools daily; which are liquid. 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 She has had long term diarrhea prior to her hospitalization. We have discussed her tube feeding; which will need to be changed to vital to better manage her diarrhea.   Past Medical History:  Diagnosis Date   Abdominal adhesions 1994   Allergic rhinitis    Anemia    Anxiety and depression    Aortic stenosis    Arthritis    Atrial fibrillation ([place]) 10/2012   Associated with severe anemia and esophageal pill impaction   Breast carcinoma (H[facility])    Right mastectomy   Cholelithiasis    Essential hypertension    Gastroesophageal reflux disease    Hiatal hernia   History of blood transfusion    Hypothyroidism    Low back pain    Malabsorption    Short gut syndrome following small bowel resection surgery x2   Nephrolithiasis 2004   Painless hematuria   Short gut syndrome    Bowel resection , 2004   Upper GI bleed 2004   Multiple episodes of melena-? due to gastritis or adverse drug effect (nonsteroidals, small bowel ulceration with Fosamax); caused by Pepto-Bismol during one Emergency Department evaluation    Past Surgical History:  Procedure Laterality Date   ABDOMINAL HYSTERECTOMY  12/15/1958   massive gynecologic bleeding post delivery   ANTERIOR CERVICAL DECOMP/DISCECTOMY FUSION N/A 10/17/2022   Procedure: Anterior Cervical Discectomy and Fusion Cervical Three-Four/Cervical Four-Five;  Surgeon: Earnie Larsson, MD;  Location: [place];  Service: Neurosurgery;  Laterality: N/A;  3C   BOWEL RESECTION     Resulting short gut syndrome   CHOLECYSTECTOMY N/A 06/25/2018   Procedure: LAPAROSCOPIC CHOLECYSTECTOMY WITH INTRAOPERATIVE CHOLANGIOGRAM;  Surgeon: Armandina Gemma, MD;  [facility];  Service: General;  Laterality: N/A;   COLONOSCOPY W/ POLYPECTOMY  12/16/2003   Lipoma; diverticulosis   COLONOSCOPY WITH ESOPHAGOGASTRODUODENOSCOPY (EGD)  11/22/2012   Rehman   CYSTOSCOPY W/ RETROGRADES Bilateral 04/16/2022   Procedure: CYSTOSCOPY;  Surgeon: Primus Bravo., MD;  [facility];  Service: Urology;  Laterality: Bilateral;   ESOPHAGOGASTRODUODENOSCOPY (EGD) WITH PROPOFOL N/A 04/04/2022   Procedure: ESOPHAGOGASTRODUODENOSCOPY (EGD) WITH PROPOFOL;  Surgeon: Rogene Houston, MD;  [facility];  Service: Endoscopy;  Laterality: N/A;  210   HIP ARTHROPLASTY Right 06/15/2020   Procedure: ANTERIOR ARTHROPLASTY BIPOLAR HIP (HEMIARTHROPLASTY);  Surgeon: Mcarthur Rossetti, MD;  [facility];  Service: Orthopedics;  Laterality: Right;   IR GASTROSTOMY TUBE MOD SED  10/29/2022   LAPAROSCOPIC LYSIS OF ADHESIONS  12/16/1963   s/p adhesions   MASTECTOMY Right    Carcinoma of the breast; right   TRANSURETHRAL RESECTION OF BLADDER TUMOR N/A 04/16/2022   Procedure: TRANSURETHRAL RESECTION OF BLADDER TUMOR (TURBT);  Surgeon: Primus Bravo., MD;  [facility];  Service: Urology;  Laterality: N/A;   UPPER GASTROINTESTINAL ENDOSCOPY  Social History   Socioeconomic History   Marital status: Widowed    Spouse name: Not on file   Number of children: Not on file   Years of education: 12   Highest education level: Not on file  Occupational History   Not on file  Tobacco Use   Smoking status: Former    Packs/day: 1.50    Years: 20.00    Total pack years: 30.00    Types: Cigarettes    Passive exposure: Never   Smokeless tobacco: Never  Vaping Use   Vaping Use:  Never used  Substance and Sexual Activity   Alcohol use: No   Drug use: No   Sexual activity: Not Currently    Birth control/protection: Post-menopausal, Surgical    Comment: hyst  Other Topics Concern   Not on file  Social History Narrative   Not on file   Social Determinants of Health   Financial Resource Strain: Not on file  Food Insecurity: No Food Insecurity (10/20/2022)   Hunger Vital Sign    Worried About Running Out of Food in the Last Year: Never true    Ran Out of Food in the Last Year: Never true  Transportation Needs: No Transportation Needs (10/20/2022)   PRAPARE - Hydrologist (Medical): No    Lack of Transportation (Non-Medical): No  Physical Activity: Not on file  Stress: Not on file  Social Connections: Not on file  Intimate Partner Violence: Not At Risk (10/20/2022)   Humiliation, Afraid, Rape, and Kick questionnaire    Fear of Current or Ex-Partner: No    Emotionally Abused: No    Physically Abused: No    Sexually Abused: No   Family History  Problem Relation Age of Onset   Anuerysm Father    Rheum arthritis Sister    Healthy Sister    COPD Sister    Healthy Brother    Cancer Other    Colon cancer Neg Hx       VITAL SIGNS BP 115/68   Pulse 68   Temp 97.6 F (36.4 C)   Resp 18   Ht '5\' 1"'$  (1.549 m)   Wt 106 lb (48.1 kg)   SpO2 95%   BMI 20.03 kg/m   Outpatient Encounter Medications as of 11/13/2022  Medication Sig Note   acetaminophen (TYLENOL) 500 MG tablet Take 1 tablet (500 mg total) by mouth 4 (four) times daily -  with meals and at bedtime. (Patient taking differently: Take 500 mg by mouth every 6 (six) hours as needed for moderate pain.)    ALPRAZolam (XANAX) 0.5 MG tablet Take 2 tablets (1 mg total) by mouth at bedtime.    amLODipine (NORVASC) 10 MG tablet Place 1 tablet (10 mg total) into feeding tube daily.    dexamethasone (DECADRON) 2 MG tablet Place 1 tablet (2 mg total) into feeding tube every 12  (twelve) hours.    DULoxetine (CYMBALTA) 30 MG capsule Take 30 mg by mouth every morning. Take with a 60 mg capsule for a total of 90 mg daily    DULoxetine (CYMBALTA) 60 MG capsule Take 60 mg by mouth every morning. T 10/10/2022: Take with a 30 mg capsule for a total of 90 mg daily    famotidine (PEPCID) 20 MG tablet Place 1 tablet (20 mg total) into feeding tube daily.    gabapentin (NEURONTIN) 250 MG/5ML solution Place 2 mLs (100 mg total) into feeding tube 2 (two) times daily at 10  am and 4 pm.    gabapentin (NEURONTIN) 250 MG/5ML solution Place 4 mLs (200 mg total) into feeding tube at bedtime.    HYDROcodone-acetaminophen (NORCO/VICODIN) 5-325 MG tablet Take 1 tablet by mouth every 4 (four) hours as needed for moderate pain ((score 4 to 6)).    levothyroxine (SYNTHROID) 75 MCG tablet Take 1 tablet (75 mcg total) by mouth daily before breakfast.    loperamide (IMODIUM) 2 MG capsule TAKE ONE CAPSULE ('2MG'$  TOTAL) BY MOUTH FOUR TIMES DAILY AS NEEDED FOR DIARRHEA OR LOOSE STOOLS (Patient taking differently: Take 2 mg by mouth 4 (four) times daily as needed for diarrhea or loose stools.)    Meth-Hyo-M Bl-Na Phos-Ph Sal (URIBEL) 118 MG CAPS Take 1 capsule (118 mg total) by mouth 4 (four) times daily as needed (pain with urination).    methocarbamol (ROBAXIN) 750 MG tablet Take 1 tablet (750 mg total) by mouth 4 (four) times daily. (Patient taking differently: Take 750 mg by mouth every 6 (six) hours as needed for muscle spasms.)    Metoprolol Tartrate 75 MG TABS Place 75 mg into feeding tube 2 (two) times daily.    mirabegron ER (MYRBETRIQ) 50 MG TB24 tablet Take 1 tablet (50 mg total) by mouth daily.    Multiple Vitamins-Iron (MULTIVITAMINS WITH IRON) TABS tablet Take 1 tablet by mouth every morning. 10/10/2022: Flinstone vitamin    Nutritional Supplements (FEEDING SUPPLEMENT, JEVITY 1.2 CAL,) LIQD Place 1,000 mLs into feeding tube continuous. Run at 55 cc/hr starting AM 11/01/22    Protein (FEEDING  SUPPLEMENT, PROSOURCE TF20,) liquid Place 60 mLs into feeding tube daily.    sodium bicarbonate 650 MG tablet Take 650 mg by mouth 2 (two) times daily.    No facility-administered encounter medications on file as of 11/13/2022.     SIGNIFICANT DIAGNOSTIC EXAMS  LABS REVIEWED PREVIOUS   11-06-22; wbc 10.6; hgb 10.7; hct 33.7; mcv 86.4 plt 187; glucose 114; bun 62; creat 1.26; k+ 5.9; na++ 133; ca 8.5; gfr 39; mag 2.5; phos 5.1  11-10-22: glucose 104; bun 72; creat 1.19; k+ 5.8; na++ 132; ca 8.4; gfr 42; phos 4.8; albumin 3.3; mag 2.6 11-11-22: glucose 117; bun 71; creat 1.06 ;k+ 5.6; na++ 135; ca 8.1; gfr 48; phos 5.0; albumin 3.1; mag 2.5; cortisol 1.0  11-11-22: glucose 109; bun 72; creat 0.96; k+ 5.3; na++ 138; ca 8.4; gfr 54; phos 4.2; albuin 3.1; mag 2.4  TODAY  11-12-22: glucose 109; bun 72; creat 0.96; k+ 5.3; na++ 138; ca 8.4 gfr 54; phos 4.2; albumin 3.1; mag 2.4 11-13-22: glucose 102; bun 63; creat 0.97; k+ 4.2; na++ 135;ca 8.0; gfr 54; phos 5.0; albumin 2.9; mag 2.3;   ACTH 6.1  Review of Systems  Constitutional:  Negative for malaise/fatigue.  Respiratory:  Negative for cough and shortness of breath.   Cardiovascular:  Negative for chest pain, palpitations and leg swelling.  Gastrointestinal:  Positive for diarrhea. Negative for abdominal pain and heartburn.  Musculoskeletal:  Negative for back pain, joint pain and myalgias.  Skin: Negative.   Neurological:  Negative for dizziness.  Psychiatric/Behavioral:  The patient is not nervous/anxious.    Physical Exam Constitutional:      General: She is not in acute distress.    Appearance: She is underweight. She is not diaphoretic.  Neck:     Thyroid: No thyromegaly.  Cardiovascular:     Rate and Rhythm: Normal rate and regular rhythm.     Pulses: Normal pulses.     Heart sounds:  Normal heart sounds.  Pulmonary:     Effort: Pulmonary effort is normal. No respiratory distress.     Breath sounds: Normal breath sounds.   Abdominal:     General: Bowel sounds are normal. There is no distension.     Palpations: Abdomen is soft.     Tenderness: There is no abdominal tenderness.     Comments: Peg tube present   Musculoskeletal:        General: Normal range of motion.     Cervical back: Neck supple.     Right lower leg: No edema.     Left lower leg: No edema.  Lymphadenopathy:     Cervical: No cervical adenopathy.  Skin:    General: Skin is warm and dry.  Neurological:     Mental Status: She is alert. Mental status is at baseline.  Psychiatric:        Mood and Affect: Mood normal.       ASSESSMENT/ PLAN:  TODAY  Addison's disease Chronic diarrhea  Is off cymbalta due to peg tube she is unable to swallow Will change her feeding to vital which has adequate calcium replacement Will await Dr. Liliane Channel response for dosing cortisone   Ok Edwards NP [facility]  call [phone]

## 2022-11-14 ENCOUNTER — Other Ambulatory Visit (HOSPITAL_COMMUNITY): Payer: Self-pay | Admitting: Adult Health

## 2022-11-14 ENCOUNTER — Encounter: Payer: Self-pay | Admitting: Adult Health

## 2022-11-14 ENCOUNTER — Ambulatory Visit (HOSPITAL_COMMUNITY)
Admission: RE | Admit: 2022-11-14 | Discharge: 2022-11-14 | Disposition: A | Discharge status: Home / Self Care | Payer: Medicare Other | Source: Ambulatory Visit | Attending: Adult Health | Admitting: Adult Health

## 2022-11-14 ENCOUNTER — Non-Acute Institutional Stay (SKILLED_NURSING_FACILITY): Payer: Medicare Other | Admitting: Adult Health

## 2022-11-14 DIAGNOSIS — E271 Primary adrenocortical insufficiency: Secondary | ICD-10-CM | POA: Diagnosis present

## 2022-11-14 DIAGNOSIS — C670 Malignant neoplasm of trigone of bladder: Secondary | ICD-10-CM | POA: Diagnosis not present

## 2022-11-14 DIAGNOSIS — E2740 Unspecified adrenocortical insufficiency: Secondary | ICD-10-CM

## 2022-11-14 DIAGNOSIS — R488 Other symbolic dysfunctions: Secondary | ICD-10-CM | POA: Diagnosis not present

## 2022-11-14 DIAGNOSIS — K912 Postsurgical malabsorption, not elsewhere classified: Secondary | ICD-10-CM | POA: Diagnosis not present

## 2022-11-14 DIAGNOSIS — N1330 Unspecified hydronephrosis: Secondary | ICD-10-CM | POA: Diagnosis not present

## 2022-11-14 DIAGNOSIS — R52 Pain, unspecified: Secondary | ICD-10-CM | POA: Diagnosis not present

## 2022-11-14 DIAGNOSIS — Z515 Encounter for palliative care: Secondary | ICD-10-CM | POA: Diagnosis not present

## 2022-11-14 DIAGNOSIS — E43 Unspecified severe protein-calorie malnutrition: Secondary | ICD-10-CM | POA: Diagnosis present

## 2022-11-14 DIAGNOSIS — S14129S Central cord syndrome at unspecified level of cervical spinal cord, sequela: Secondary | ICD-10-CM | POA: Diagnosis not present

## 2022-11-14 DIAGNOSIS — K90821 Short bowel syndrome with colon in continuity: Secondary | ICD-10-CM | POA: Diagnosis not present

## 2022-11-14 DIAGNOSIS — R278 Other lack of coordination: Secondary | ICD-10-CM | POA: Diagnosis not present

## 2022-11-14 DIAGNOSIS — E875 Hyperkalemia: Secondary | ICD-10-CM | POA: Diagnosis present

## 2022-11-14 DIAGNOSIS — Z9181 History of falling: Secondary | ICD-10-CM | POA: Diagnosis not present

## 2022-11-14 DIAGNOSIS — R600 Localized edema: Secondary | ICD-10-CM | POA: Diagnosis not present

## 2022-11-14 DIAGNOSIS — M4802 Spinal stenosis, cervical region: Secondary | ICD-10-CM | POA: Diagnosis not present

## 2022-11-14 DIAGNOSIS — N179 Acute kidney failure, unspecified: Secondary | ICD-10-CM

## 2022-11-14 DIAGNOSIS — N184 Chronic kidney disease, stage 4 (severe): Secondary | ICD-10-CM | POA: Diagnosis not present

## 2022-11-14 DIAGNOSIS — Z741 Need for assistance with personal care: Secondary | ICD-10-CM | POA: Diagnosis not present

## 2022-11-14 DIAGNOSIS — D509 Iron deficiency anemia, unspecified: Secondary | ICD-10-CM | POA: Diagnosis not present

## 2022-11-14 DIAGNOSIS — D696 Thrombocytopenia, unspecified: Secondary | ICD-10-CM | POA: Diagnosis not present

## 2022-11-14 DIAGNOSIS — F419 Anxiety disorder, unspecified: Secondary | ICD-10-CM | POA: Diagnosis present

## 2022-11-14 DIAGNOSIS — R197 Diarrhea, unspecified: Secondary | ICD-10-CM | POA: Diagnosis not present

## 2022-11-14 DIAGNOSIS — E8809 Other disorders of plasma-protein metabolism, not elsewhere classified: Secondary | ICD-10-CM | POA: Diagnosis present

## 2022-11-14 DIAGNOSIS — K219 Gastro-esophageal reflux disease without esophagitis: Secondary | ICD-10-CM | POA: Diagnosis present

## 2022-11-14 DIAGNOSIS — Z7189 Other specified counseling: Secondary | ICD-10-CM | POA: Diagnosis not present

## 2022-11-14 DIAGNOSIS — K909 Intestinal malabsorption, unspecified: Secondary | ICD-10-CM | POA: Diagnosis present

## 2022-11-14 DIAGNOSIS — M25522 Pain in left elbow: Secondary | ICD-10-CM | POA: Diagnosis not present

## 2022-11-14 DIAGNOSIS — I129 Hypertensive chronic kidney disease with stage 1 through stage 4 chronic kidney disease, or unspecified chronic kidney disease: Secondary | ICD-10-CM | POA: Diagnosis present

## 2022-11-14 DIAGNOSIS — C679 Malignant neoplasm of bladder, unspecified: Secondary | ICD-10-CM

## 2022-11-14 DIAGNOSIS — N1832 Chronic kidney disease, stage 3b: Secondary | ICD-10-CM | POA: Diagnosis present

## 2022-11-14 DIAGNOSIS — K529 Noninfective gastroenteritis and colitis, unspecified: Secondary | ICD-10-CM | POA: Diagnosis not present

## 2022-11-14 DIAGNOSIS — K922 Gastrointestinal hemorrhage, unspecified: Secondary | ICD-10-CM | POA: Diagnosis present

## 2022-11-14 DIAGNOSIS — M25512 Pain in left shoulder: Secondary | ICD-10-CM | POA: Diagnosis not present

## 2022-11-14 DIAGNOSIS — I10 Essential (primary) hypertension: Secondary | ICD-10-CM | POA: Diagnosis not present

## 2022-11-14 DIAGNOSIS — D649 Anemia, unspecified: Secondary | ICD-10-CM | POA: Diagnosis present

## 2022-11-14 DIAGNOSIS — M6281 Muscle weakness (generalized): Secondary | ICD-10-CM | POA: Diagnosis not present

## 2022-11-14 DIAGNOSIS — J189 Pneumonia, unspecified organism: Secondary | ICD-10-CM | POA: Diagnosis not present

## 2022-11-14 DIAGNOSIS — R635 Abnormal weight gain: Secondary | ICD-10-CM | POA: Diagnosis not present

## 2022-11-14 DIAGNOSIS — R770 Abnormality of albumin: Secondary | ICD-10-CM | POA: Diagnosis not present

## 2022-11-14 DIAGNOSIS — E039 Hypothyroidism, unspecified: Secondary | ICD-10-CM | POA: Diagnosis present

## 2022-11-14 DIAGNOSIS — F32A Depression, unspecified: Secondary | ICD-10-CM | POA: Diagnosis present

## 2022-11-14 DIAGNOSIS — G959 Disease of spinal cord, unspecified: Secondary | ICD-10-CM | POA: Diagnosis not present

## 2022-11-14 DIAGNOSIS — I132 Hypertensive heart and chronic kidney disease with heart failure and with stage 5 chronic kidney disease, or end stage renal disease: Secondary | ICD-10-CM | POA: Diagnosis not present

## 2022-11-14 DIAGNOSIS — I5032 Chronic diastolic (congestive) heart failure: Secondary | ICD-10-CM | POA: Diagnosis not present

## 2022-11-14 DIAGNOSIS — Z452 Encounter for adjustment and management of vascular access device: Secondary | ICD-10-CM | POA: Diagnosis not present

## 2022-11-14 DIAGNOSIS — J690 Pneumonitis due to inhalation of food and vomit: Secondary | ICD-10-CM | POA: Diagnosis not present

## 2022-11-14 DIAGNOSIS — R1314 Dysphagia, pharyngoesophageal phase: Secondary | ICD-10-CM | POA: Diagnosis not present

## 2022-11-14 DIAGNOSIS — J9601 Acute respiratory failure with hypoxia: Secondary | ICD-10-CM | POA: Diagnosis not present

## 2022-11-14 DIAGNOSIS — E872 Acidosis, unspecified: Secondary | ICD-10-CM | POA: Diagnosis present

## 2022-11-14 DIAGNOSIS — Z431 Encounter for attention to gastrostomy: Secondary | ICD-10-CM | POA: Diagnosis not present

## 2022-11-14 DIAGNOSIS — Z66 Do not resuscitate: Secondary | ICD-10-CM | POA: Diagnosis present

## 2022-11-14 DIAGNOSIS — R262 Difficulty in walking, not elsewhere classified: Secondary | ICD-10-CM | POA: Diagnosis not present

## 2022-11-14 DIAGNOSIS — K90829 Short bowel syndrome, unspecified: Secondary | ICD-10-CM | POA: Diagnosis present

## 2022-11-14 DIAGNOSIS — J9611 Chronic respiratory failure with hypoxia: Secondary | ICD-10-CM | POA: Diagnosis not present

## 2022-11-14 DIAGNOSIS — R1310 Dysphagia, unspecified: Secondary | ICD-10-CM | POA: Diagnosis present

## 2022-11-14 DIAGNOSIS — I350 Nonrheumatic aortic (valve) stenosis: Secondary | ICD-10-CM | POA: Diagnosis present

## 2022-11-14 DIAGNOSIS — Z6823 Body mass index (BMI) 23.0-23.9, adult: Secondary | ICD-10-CM | POA: Diagnosis not present

## 2022-11-14 DIAGNOSIS — I480 Paroxysmal atrial fibrillation: Secondary | ICD-10-CM | POA: Diagnosis not present

## 2022-11-14 DIAGNOSIS — E869 Volume depletion, unspecified: Secondary | ICD-10-CM | POA: Diagnosis present

## 2022-11-14 DIAGNOSIS — R531 Weakness: Secondary | ICD-10-CM | POA: Diagnosis not present

## 2022-11-14 DIAGNOSIS — L89152 Pressure ulcer of sacral region, stage 2: Secondary | ICD-10-CM | POA: Diagnosis present

## 2022-11-14 DIAGNOSIS — I959 Hypotension, unspecified: Secondary | ICD-10-CM | POA: Diagnosis present

## 2022-11-14 DIAGNOSIS — I4891 Unspecified atrial fibrillation: Secondary | ICD-10-CM | POA: Diagnosis present

## 2022-11-14 LAB — ACTH: C206 ACTH: 6.1 pg/mL — ABNORMAL LOW (ref 7.2–63.3)

## 2022-11-14 NOTE — Progress Notes (Addendum)
Location:  [facility] Room Number: 852 Place of Service:  SNF (31)   CODE STATUS: dnr   Allergies  Allergen Reactions   Codeine Nausea And Vomiting    Chief Complaint  Patient presents with   Acute Visit    Care plan meeting     HPI:  We have come together for her care plan meeting. Family present BIMS 14/15 mood 16/30: depression; not sleeping well; wants to eat and cannot; decreased energy; trouble concentrating; nervous. Non ambulatory with no falls. She is max assist to dependent for her adls care. She is incontinent of bladder and bowel. Dietary: has long history for a number of years NPO 106 pound; will change to vital tube feeding.  Therapy: upper body mod assist; lower body max assist; brp mas assist transfers: mod assist bed mobility: min assist with bed rail; ambulate 75 feet with rolling walker with moderate assist car transfer: max assist; on ST: NPO: training for po intake.  Activities: stays in her room . Her cortisol level is 1. I have received an email back from Dr. Dorris Fetch will start her on hydrocortisone 10 mg in the Am ad 5 mg in the pm. Will begin fludrocortisone daily. Will wean off decadron.she is unable to take cymbalta as this medication cannot be crushed.  She continues to have frequent loose stools. 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  Past Medical History:  Diagnosis Date   Abdominal adhesions 1994   Allergic rhinitis    Anemia    Anxiety and depression    Aortic stenosis    Arthritis    Atrial fibrillation ([place]) 10/2012   Associated with severe anemia and esophageal pill impaction   Breast carcinoma ([place])    Right mastectomy   Cholelithiasis    Essential hypertension    Gastroesophageal reflux disease    Hiatal hernia   History of blood transfusion    Hypothyroidism     Low back pain    Malabsorption    Short gut syndrome following small bowel resection surgery x2   Nephrolithiasis 2004   Painless hematuria   Short gut syndrome    Bowel resection , 2004   Upper GI bleed 2004   Multiple episodes of melena-? due to gastritis or adverse drug effect (nonsteroidals, small bowel ulceration with Fosamax); caused by Pepto-Bismol during one Emergency Department evaluation    Past Surgical History:  Procedure Laterality Date   ABDOMINAL HYSTERECTOMY  12/15/1958   massive gynecologic bleeding post delivery   ANTERIOR CERVICAL DECOMP/DISCECTOMY FUSION N/A 10/17/2022   Procedure: Anterior Cervical Discectomy and Fusion Cervical Three-Four/Cervical Four-Five;  Surgeon: Earnie Larsson, MD;  Location: [address];  Service: Neurosurgery;  Laterality: N/A;  3C   BOWEL RESECTION     Resulting short gut syndrome   CHOLECYSTECTOMY N/A 06/25/2018   Procedure: LAPAROSCOPIC CHOLECYSTECTOMY WITH INTRAOPERATIVE CHOLANGIOGRAM;  Surgeon: Armandina Gemma, MD;  [facility];  Service: General;  Laterality: N/A;   COLONOSCOPY W/ POLYPECTOMY  12/16/2003   Lipoma; diverticulosis   COLONOSCOPY WITH ESOPHAGOGASTRODUODENOSCOPY (EGD)  11/22/2012   Rehman   CYSTOSCOPY W/ RETROGRADES Bilateral 04/16/2022   Procedure: CYSTOSCOPY;  Surgeon: Primus Bravo., MD;  [facility];  Service: Urology;  Laterality: Bilateral;   ESOPHAGOGASTRODUODENOSCOPY (EGD) WITH PROPOFOL N/A 04/04/2022   Procedure: ESOPHAGOGASTRODUODENOSCOPY (EGD) WITH PROPOFOL;  Surgeon: Laural Golden,  Mechele Dawley, MD;  [facility];  Service: Endoscopy;  Laterality: N/A;  210   HIP ARTHROPLASTY Right 06/15/2020   Procedure: ANTERIOR ARTHROPLASTY BIPOLAR HIP (HEMIARTHROPLASTY);  Surgeon: Mcarthur Rossetti, MD;  [facility];  Service: Orthopedics;  Laterality: Right;   IR GASTROSTOMY TUBE MOD SED  10/29/2022   LAPAROSCOPIC LYSIS OF ADHESIONS  12/16/1963   s/p adhesions   MASTECTOMY Right    Carcinoma of the  breast; right   TRANSURETHRAL RESECTION OF BLADDER TUMOR N/A 04/16/2022   Procedure: TRANSURETHRAL RESECTION OF BLADDER TUMOR (TURBT);  Surgeon: Primus Bravo., MD;  [facility];  Service: Urology;  Laterality: N/A;   UPPER GASTROINTESTINAL ENDOSCOPY      Social History   Socioeconomic History   Marital status: Widowed    Spouse name: Not on file   Number of children: Not on file   Years of education: 12   Highest education level: Not on file  Occupational History   Not on file  Tobacco Use   Smoking status: Former    Packs/day: 1.50    Years: 20.00    Total pack years: 30.00    Types: Cigarettes    Passive exposure: Never   Smokeless tobacco: Never  Vaping Use   Vaping Use: Never used  Substance and Sexual Activity   Alcohol use: No   Drug use: No   Sexual activity: Not Currently    Birth control/protection: Post-menopausal, Surgical    Comment: hyst  Other Topics Concern   Not on file  Social History Narrative   Not on file   Social Determinants of Health   Financial Resource Strain: Not on file  Food Insecurity: No Food Insecurity (10/20/2022)   Hunger Vital Sign    Worried About Running Out of Food in the Last Year: Never true    Ran Out of Food in the Last Year: Never true  Transportation Needs: No Transportation Needs (10/20/2022)   PRAPARE - Hydrologist (Medical): No    Lack of Transportation (Non-Medical): No  Physical Activity: Not on file  Stress: Not on file  Social Connections: Not on file  Intimate Partner Violence: Not At Risk (10/20/2022)   Humiliation, Afraid, Rape, and Kick questionnaire    Fear of Current or Ex-Partner: No    Emotionally Abused: No    Physically Abused: No    Sexually Abused: No   Family History  Problem Relation Age of Onset   Anuerysm Father    Rheum arthritis Sister    Healthy Sister    COPD Sister    Healthy Brother    Cancer Other    Colon cancer Neg Hx       VITAL  SIGNS BP 110/67   Pulse 70   Temp 98.1 F (36.7 C)   Resp 18   Ht '5\' 1"'$  (1.549 m)   Wt 106 lb (48.1 kg)   SpO2 95%   BMI 20.03 kg/m   Outpatient Encounter Medications as of 11/14/2022  Medication Sig Note   Amino Acids-Protein Hydrolys (FEEDING SUPPLEMENT, PRO-STAT SUGAR FREE 64,) LIQD Take 30 mLs by mouth 3 (three) times daily with meals.    diphenoxylate-atropine (LOMOTIL) 2.5-0.025 MG tablet Take 1 tablet by mouth every 8 (eight) hours.    acetaminophen (TYLENOL) 500 MG tablet Take 1 tablet (500 mg total) by mouth 4 (four) times daily -  with meals and at bedtime. (Patient taking differently: Take 500 mg by mouth every  6 (six) hours as needed for moderate pain.)    ALPRAZolam (XANAX) 0.5 MG tablet Take 2 tablets (1 mg total) by mouth at bedtime.    amLODipine (NORVASC) 10 MG tablet Place 1 tablet (10 mg total) into feeding tube daily.    dexamethasone (DECADRON) 2 MG tablet Place 1 tablet (2 mg total) into feeding tube every 12 (twelve) hours.    famotidine (PEPCID) 20 MG tablet Place 1 tablet (20 mg total) into feeding tube daily.    gabapentin (NEURONTIN) 250 MG/5ML solution Place 2 mLs (100 mg total) into feeding tube 2 (two) times daily at 10 am and 4 pm.    gabapentin (NEURONTIN) 250 MG/5ML solution Place 4 mLs (200 mg total) into feeding tube at bedtime.    levothyroxine (SYNTHROID) 75 MCG tablet Take 1 tablet (75 mcg total) by mouth daily before breakfast.    Meth-Hyo-M Bl-Na Phos-Ph Sal (URIBEL) 118 MG CAPS Take 1 capsule (118 mg total) by mouth 4 (four) times daily as needed (pain with urination).    methocarbamol (ROBAXIN) 750 MG tablet Take 1 tablet (750 mg total) by mouth 4 (four) times daily.    Metoprolol Tartrate 75 MG TABS Place 75 mg into feeding tube 2 (two) times daily.    Multiple Vitamins-Iron (MULTIVITAMINS WITH IRON) TABS tablet Take 1 tablet by mouth every morning. 10/10/2022: Flinstone vitamin    Nutritional Supplements (FEEDING SUPPLEMENT, JEVITY 1.2 CAL,) LIQD  Place 1,000 mLs into feeding tube continuous. Run at 55 cc/hr starting AM 11/01/22    sodium bicarbonate 650 MG tablet Take 650 mg by mouth 2 (two) times daily.    [DISCONTINUED] DULoxetine (CYMBALTA) 30 MG capsule Take 30 mg by mouth every morning. Take with a 60 mg capsule for a total of 90 mg daily    [DISCONTINUED] DULoxetine (CYMBALTA) 60 MG capsule Take 60 mg by mouth every morning. T 10/10/2022: Take with a 30 mg capsule for a total of 90 mg daily    [DISCONTINUED] HYDROcodone-acetaminophen (NORCO/VICODIN) 5-325 MG tablet Take 1 tablet by mouth every 4 (four) hours as needed for moderate pain ((score 4 to 6)).    [DISCONTINUED] loperamide (IMODIUM) 2 MG capsule TAKE ONE CAPSULE ('2MG'$  TOTAL) BY MOUTH FOUR TIMES DAILY AS NEEDED FOR DIARRHEA OR LOOSE STOOLS (Patient taking differently: Take 2 mg by mouth 4 (four) times daily as needed for diarrhea or loose stools.)    [DISCONTINUED] mirabegron ER (MYRBETRIQ) 50 MG TB24 tablet Take 1 tablet (50 mg total) by mouth daily.    [DISCONTINUED] Protein (FEEDING SUPPLEMENT, PROSOURCE TF20,) liquid Place 60 mLs into feeding tube daily.    No facility-administered encounter medications on file as of 11/14/2022.     SIGNIFICANT DIAGNOSTIC EXAMS  TODAY   11-14-22: renal/adrenal ultrasound:  1. Moderate left hydronephrosis, increased from previous ultrasound but similar to more recent CT. 2. Probable dependent bladder mass with internal blood flow suspicious for recurrent bladder malignancy, especially given the patient's history. The adrenal glands  was suboptimally evaluated on recent CT due to artifact from the right hip replacement. If not recently performed, recommend cystoscopy. 3. Right renal staghorn calculus, bilateral renal cysts and renal cortical thinning and increased echogenicity, as before, compatible with chronic medical renal disease.    LABS REVIEWED: TODAY  11-06-22; wbc 10.6; hgb 10.7; hct 33.7; mcv 86.4 plt 187; glucose 114; bun 62;  creat 1.26; k+ 5.9; na++ 133; ca 8.5; gfr 39; mag 2.5; phos 5.1  11-10-22: glucose 104; bun 72; creat 1.19; k+ 5.8;  na++ 132; ca 8.4; gfr 42; phos 4.8; albumin 3.3; mag 2.6 11-11-22: glucose 117; bun 71; creat 1.06 ;k+ 5.6; na++ 135; ca 8.1; gfr 48; phos 5.0; albumin 3.1; mag 2.5; cortisol 1.0    Review of Systems  Constitutional:  Negative for malaise/fatigue.  Respiratory:  Negative for cough and shortness of breath.   Cardiovascular:  Negative for chest pain, palpitations and leg swelling.  Gastrointestinal:  Negative for abdominal pain, constipation and heartburn.  Musculoskeletal:  Negative for back pain, joint pain and myalgias.  Skin: Negative.   Neurological:  Negative for dizziness.  Psychiatric/Behavioral:  The patient is not nervous/anxious.    Physical Exam Constitutional:      General: She is not in acute distress.    Appearance: She is underweight. She is not diaphoretic.  Neck:     Thyroid: No thyromegaly.  Cardiovascular:     Rate and Rhythm: Normal rate and regular rhythm.     Pulses: Normal pulses.     Heart sounds: Normal heart sounds.  Pulmonary:     Effort: Pulmonary effort is normal. No respiratory distress.     Breath sounds: Normal breath sounds.  Abdominal:     General: Bowel sounds are normal. There is no distension.     Palpations: Abdomen is soft.     Tenderness: There is no abdominal tenderness.     Comments: Peg tube present   Musculoskeletal:        General: Normal range of motion.     Cervical back: Neck supple.     Right lower leg: No edema.     Left lower leg: No edema.  Lymphadenopathy:     Cervical: No cervical adenopathy.  Skin:    General: Skin is warm and dry.  Neurological:     Mental Status: She is alert and oriented to person, place, and time.  Psychiatric:        Mood and Affect: Mood normal.        ASSESSMENT/ PLAN:  TODAY  Addison's disease Central cord syndrome of cervical spinal cord  10-17-22  Urothelial carcinoma  of bladder with invasion of muscle   Will begin hydrocortisone 10 mg in the am and 5 mg in the AM Will begin fludrocortisone 0.1 mg daily  Will wean off decadron Will begin zoloft 05 mg daily  Will add antifungal cream to skin care  Will continue to monitor her status.   Time spent with patient: 40 minutes: medications; plan of care ; therapy   Ok Edwards NP [facility]   call [phone]

## 2022-11-16 ENCOUNTER — Encounter (HOSPITAL_COMMUNITY)
Admission: RE | Admit: 2022-11-16 | Discharge: 2022-11-16 | Disposition: A | Discharge status: Home / Self Care | Payer: Medicare Other | Source: Skilled Nursing Facility | Attending: Adult Health | Admitting: Adult Health

## 2022-11-16 DIAGNOSIS — N1832 Chronic kidney disease, stage 3b: Secondary | ICD-10-CM | POA: Insufficient documentation

## 2022-11-16 LAB — BASIC METABOLIC PANEL
Anion gap: 7 (ref 5–15)
BUN: 62 mg/dL — ABNORMAL HIGH (ref 8–23)
CO2: 17 mmol/L — ABNORMAL LOW (ref 22–32)
Calcium: 8.1 mg/dL — ABNORMAL LOW (ref 8.9–10.3)
Chloride: 112 mmol/L — ABNORMAL HIGH (ref 98–111)
Creatinine, Ser: 0.91 mg/dL (ref 0.44–1.00)
GFR, Estimated: 58 mL/min — ABNORMAL LOW (ref >60)
Glucose, Bld: 113 mg/dL — ABNORMAL HIGH (ref 70–99)
Potassium: 4.2 mmol/L (ref 3.5–5.1)
Sodium: 136 mmol/L (ref 135–145)

## 2022-11-16 LAB — PHOSPHORUS: Phosphorus: 3.7 mg/dL (ref 2.5–4.6)

## 2022-11-18 ENCOUNTER — Other Ambulatory Visit (HOSPITAL_COMMUNITY)
Admission: RE | Admit: 2022-11-18 | Discharge: 2022-11-18 | Disposition: A | Discharge status: Home / Self Care | Payer: Medicare Other | Source: Skilled Nursing Facility | Attending: Adult Health | Admitting: Adult Health

## 2022-11-18 DIAGNOSIS — E2740 Unspecified adrenocortical insufficiency: Secondary | ICD-10-CM | POA: Insufficient documentation

## 2022-11-18 LAB — CORTISOL: Cortisol, Plasma: 9.6 ug/dL

## 2022-11-24 ENCOUNTER — Other Ambulatory Visit (HOSPITAL_COMMUNITY)
Admission: RE | Admit: 2022-11-24 | Discharge: 2022-11-24 | Disposition: A | Discharge status: Home / Self Care | Payer: Medicare Other | Source: Skilled Nursing Facility | Attending: Adult Health | Admitting: Adult Health

## 2022-11-24 DIAGNOSIS — I132 Hypertensive heart and chronic kidney disease with heart failure and with stage 5 chronic kidney disease, or end stage renal disease: Secondary | ICD-10-CM | POA: Insufficient documentation

## 2022-11-24 LAB — RENAL FUNCTION PANEL
Albumin: 2.7 g/dL — ABNORMAL LOW (ref 3.5–5.0)
Anion gap: 5 (ref 5–15)
BUN: 66 mg/dL — ABNORMAL HIGH (ref 8–23)
CO2: 20 mmol/L — ABNORMAL LOW (ref 22–32)
Calcium: 8.1 mg/dL — ABNORMAL LOW (ref 8.9–10.3)
Chloride: 113 mmol/L — ABNORMAL HIGH (ref 98–111)
Creatinine, Ser: 0.95 mg/dL (ref 0.44–1.00)
GFR, Estimated: 55 mL/min — ABNORMAL LOW (ref >60)
Glucose, Bld: 106 mg/dL — ABNORMAL HIGH (ref 70–99)
Phosphorus: 3.4 mg/dL (ref 2.5–4.6)
Potassium: 4.3 mmol/L (ref 3.5–5.1)
Sodium: 138 mmol/L (ref 135–145)

## 2022-11-24 LAB — MAGNESIUM: Magnesium: 2.2 mg/dL (ref 1.7–2.4)

## 2022-11-26 ENCOUNTER — Encounter: Payer: Self-pay | Admitting: Adult Health

## 2022-11-26 ENCOUNTER — Non-Acute Institutional Stay (SKILLED_NURSING_FACILITY): Payer: Medicare Other | Admitting: Adult Health

## 2022-11-26 DIAGNOSIS — K529 Noninfective gastroenteritis and colitis, unspecified: Secondary | ICD-10-CM | POA: Diagnosis not present

## 2022-11-26 DIAGNOSIS — K90821 Short bowel syndrome with colon in continuity: Secondary | ICD-10-CM | POA: Diagnosis not present

## 2022-11-26 NOTE — Progress Notes (Signed)
Location:  [facility] Room Number: 275 Place of Service:  SNF (31)   CODE STATUS: dnr   Allergies  Allergen Reactions   Codeine Nausea And Vomiting    Chief Complaint  Patient presents with   Acute Visit    Continued diarrhea     HPI:  She continues to have frequent diarrheal stools. Up to 15-20 a day. She is taking imodium 2 mg every 4 hours; banatrol every 6 hours; lomotil 1 tab every 8 hours. She denies any abdominal pain or vomiting. There are no reports of fevers present.   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 due to gastritis or adverse drug effect (nonsteroidals, small bowel ulceration with Fosamax); caused by Pepto-Bismol during one Emergency Department evaluation    Past Surgical History:  Procedure Laterality Date   ABDOMINAL HYSTERECTOMY  12/15/1958   massive gynecologic bleeding post delivery   ANTERIOR CERVICAL DECOMP/DISCECTOMY FUSION N/A 10/17/2022   Procedure: Anterior Cervical Discectomy and Fusion Cervical Three-Four/Cervical Four-Five;  Surgeon: Earnie Larsson, MD;  Location: [facility];  Service: Neurosurgery;  Laterality: N/A;  3C   BOWEL RESECTION     Resulting short gut syndrome   CHOLECYSTECTOMY N/A 06/25/2018   Procedure: LAPAROSCOPIC CHOLECYSTECTOMY WITH INTRAOPERATIVE  CHOLANGIOGRAM;  Surgeon: Armandina Gemma, MD;  Location: [facility];  Service: General;  Laterality: N/A;   COLONOSCOPY W/ POLYPECTOMY  12/16/2003   Lipoma; diverticulosis   COLONOSCOPY WITH ESOPHAGOGASTRODUODENOSCOPY (EGD)  11/22/2012   [facility]   CYSTOSCOPY W/ RETROGRADES Bilateral 04/16/2022   Procedure: CYSTOSCOPY;  Surgeon: Primus Bravo., MD;  Location: [facility];  Service: Urology;  Laterality: Bilateral;   ESOPHAGOGASTRODUODENOSCOPY (EGD) WITH PROPOFOL N/A 04/04/2022   Procedure: ESOPHAGOGASTRODUODENOSCOPY (EGD) WITH PROPOFOL;  Surgeon: Rogene Houston, MD;  [facility];  Service: Endoscopy;  Laterality: N/A;  210   HIP ARTHROPLASTY Right 06/15/2020   Procedure: ANTERIOR ARTHROPLASTY BIPOLAR HIP (HEMIARTHROPLASTY);  Surgeon: Mcarthur Rossetti, MD;  Location: [facility];  Service: Orthopedics;  Laterality: Right;   IR GASTROSTOMY TUBE MOD SED  10/29/2022   LAPAROSCOPIC LYSIS OF ADHESIONS  12/16/1963   s/p adhesions   MASTECTOMY Right    Carcinoma of the breast; right   TRANSURETHRAL RESECTION OF BLADDER TUMOR N/A 04/16/2022   Procedure: TRANSURETHRAL RESECTION OF BLADDER TUMOR (TURBT);  Surgeon: Primus Bravo., MD;  Location: [facility];  Service: Urology;  Laterality: N/A;   UPPER GASTROINTESTINAL ENDOSCOPY      Social History   Socioeconomic History   Marital status: Widowed    Spouse name: Not on file   Number of children: Not on file   Years of education: 12   Highest education level: Not on  file  Occupational History   Not on file  Tobacco Use   Smoking status: Former    Packs/day: 1.50    Years: 20.00    Total pack years: 30.00    Types: Cigarettes    Passive exposure: Never   Smokeless tobacco: Never  Vaping Use   Vaping Use: Never used  Substance and Sexual Activity   Alcohol use: No   Drug use: No   Sexual activity: Not Currently    Birth control/protection: Post-menopausal, Surgical    Comment: hyst  Other Topics Concern   Not on file   Social History Narrative   Not on file   Social Determinants of Health   Financial Resource Strain: Not on file  Food Insecurity: No Food Insecurity (10/20/2022)   Hunger Vital Sign    Worried About Running Out of Food in the Last Year: Never true    Ran Out of Food in the Last Year: Never true  Transportation Needs: No Transportation Needs (10/20/2022)   PRAPARE - Hydrologist (Medical): No    Lack of Transportation (Non-Medical): No  Physical Activity: Not on file  Stress: Not on file  Social Connections: Not on file  Intimate Partner Violence: Not At Risk (10/20/2022)   Humiliation, Afraid, Rape, and Kick questionnaire    Fear of Current or Ex-Partner: No    Emotionally Abused: No    Physically Abused: No    Sexually Abused: No   Family History  Problem Relation Age of Onset   Anuerysm Father    Rheum arthritis Sister    Healthy Sister    COPD Sister    Healthy Brother    Cancer Other    Colon cancer Neg Hx       VITAL SIGNS BP (!) 101/59   Pulse (!) 59   Temp (!) 97.4 F (36.3 C)   Resp 20   Ht '5\' 1"'$  (1.549 m)   Wt 109 lb 12.8 oz (49.8 kg)   SpO2 97%   BMI 20.75 kg/m   Outpatient Encounter Medications as of 11/26/2022  Medication Sig Note   acetaminophen (TYLENOL) 500 MG tablet Take 500 mg by mouth every 8 (eight) hours.    ALPRAZolam (XANAX) 0.5 MG tablet Take 2 tablets (1 mg total) by mouth at bedtime.    amLODipine (NORVASC) 10 MG tablet Place 1 tablet (10 mg total) into feeding tube daily.    clotrimazole (LOTRIMIN) 1 % cream Apply 1 Application topically.  apply to sacral area and buttocks after each incontinent episode to be followed by zinc oxide with incontinent episode    diphenoxylate-atropine (LOMOTIL) 2.5-0.025 MG tablet Take 1 tablet by mouth every 8 (eight) hours.    famotidine (PEPCID) 20 MG tablet Place 1 tablet (20 mg total) into feeding tube daily.    fiber supplement, BANATROL TF, liquid Place 60 mLs into  feeding tube every 6 (six) hours.    fludrocortisone (FLORINEF) 0.'1mg'$ /mL SUSP Take 0.1 mg by mouth daily.    FOSFOMYCIN TROMETHAMINE PO Take 3 g by mouth. Once a day every 10 days    gabapentin (NEURONTIN) 250 MG/5ML solution Place 2 mLs (100 mg total) into feeding tube 2 (two) times daily at 10 am and 4 pm.    gabapentin (NEURONTIN) 250 MG/5ML solution Place 4 mLs (200 mg total) into feeding tube at bedtime.    Heparin Sod, Pork, Lock Flush (HEPARIN FLUSH) 10 UNIT/ML SOLN injection 3 mLs in the morning and at  bedtime.    hydrocortisone (CORTEF) 10 MG tablet Take 10 mg by mouth daily.    hydrocortisone (CORTEF) 5 MG tablet Take 5 mg by mouth daily.    levothyroxine (SYNTHROID) 75 MCG tablet Take 1 tablet (75 mcg total) by mouth daily before breakfast.    loperamide (IMODIUM A-D) 2 MG tablet Take 2 mg by mouth every 4 (four) hours.    methocarbamol (ROBAXIN) 750 MG tablet Take 1 tablet (750 mg total) by mouth 4 (four) times daily.    metoprolol tartrate (LOPRESSOR) 25 MG tablet Take 75 mg by mouth 2 (two) times daily.    Multiple Vitamins-Iron (MULTIVITAMINS WITH IRON) TABS tablet Take 1 tablet by mouth every morning. 10/10/2022: Flinstone vitamin    sertraline (ZOLOFT) 50 MG tablet Take 50 mg by mouth daily.    sodium chloride 1 g tablet Take 1 g by mouth 2 (two) times daily.    sodium chloride flush (NS) 0.9 % SOLN Inject 5 mLs into the vein 2 (two) times daily.    ZINC OXIDE EX Apply topically. Apply to sacrum and perineal areas due to redness    [DISCONTINUED] acetaminophen (TYLENOL) 500 MG tablet Take 1 tablet (500 mg total) by mouth 4 (four) times daily -  with meals and at bedtime. (Patient taking differently: Take 500 mg by mouth every 8 (eight) hours as needed.)    [DISCONTINUED] Amino Acids-Protein Hydrolys (FEEDING SUPPLEMENT, PRO-STAT SUGAR FREE 64,) LIQD Take 30 mLs by mouth 3 (three) times daily with meals.    [DISCONTINUED] dexamethasone (DECADRON) 2 MG tablet Place 1 tablet (2 mg  total) into feeding tube every 12 (twelve) hours.    [DISCONTINUED] Meth-Hyo-M Bl-Na Phos-Ph Sal (URIBEL) 118 MG CAPS Take 1 capsule (118 mg total) by mouth 4 (four) times daily as needed (pain with urination).    [DISCONTINUED] Metoprolol Tartrate 75 MG TABS Place 75 mg into feeding tube 2 (two) times daily.    [DISCONTINUED] Nutritional Supplements (FEEDING SUPPLEMENT, JEVITY 1.2 CAL,) LIQD Place 1,000 mLs into feeding tube continuous. Run at 55 cc/hr starting AM 11/01/22    [DISCONTINUED] sodium bicarbonate 650 MG tablet Take 650 mg by mouth 2 (two) times daily.    No facility-administered encounter medications on file as of 11/26/2022.     SIGNIFICANT DIAGNOSTIC EXAMS  PREVIOUS   11-14-22: renal/adrenal ultrasound:  1. Moderate left hydronephrosis, increased from previous ultrasound but similar to more recent CT. 2. Probable dependent bladder mass with internal blood flow suspicious for recurrent bladder malignancy, especially given the patient's history. The adrenal glands  was suboptimally evaluated on recent CT due to artifact from the right hip replacement. If not recently performed, recommend cystoscopy. 3. Right renal staghorn calculus, bilateral renal cysts and renal cortical thinning and increased echogenicity, as before, compatible with chronic medical renal disease.  NO NEW EXAMS     LABS REVIEWED: PREVIOUS   11-06-22; wbc 10.6; hgb 10.7; hct 33.7; mcv 86.4 plt 187; glucose 114; bun 62; creat 1.26; k+ 5.9; na++ 133; ca 8.5; gfr 39; mag 2.5; phos 5.1  11-10-22: glucose 104; bun 72; creat 1.19; k+ 5.8; na++ 132; ca 8.4; gfr 42; phos 4.8; albumin 3.3; mag 2.6 11-11-22: glucose 117; bun 71; creat 1.06 ;k+ 5.6; na++ 135; ca 8.1; gfr 48; phos 5.0; albumin 3.1; mag 2.5; cortisol 1.0    TODAY  11-24-22: glucose 106; bun 66; creat 0.95; k+ 4.3; na++ 138; ca 8.1 gfr 55; phos 3.4 albumin 2.7    Review of Systems  Constitutional:  Negative for malaise/fatigue.  Respiratory:   Negative for cough and shortness of breath.   Cardiovascular:  Negative for chest pain, palpitations and leg swelling.  Gastrointestinal:  Positive for diarrhea. Negative for abdominal pain and heartburn.  Musculoskeletal:  Negative for back pain, joint pain and myalgias.  Skin: Negative.   Neurological:  Negative for dizziness.  Psychiatric/Behavioral:  The patient is not nervous/anxious.    Physical Exam Constitutional:      General: She is not in acute distress.    Appearance: She is well-developed. She is not diaphoretic.     Comments: thin  Neck:     Thyroid: No thyromegaly.  Cardiovascular:     Rate and Rhythm: Normal rate and regular rhythm.     Pulses: Normal pulses.     Heart sounds: Normal heart sounds.  Pulmonary:     Effort: Pulmonary effort is normal. No respiratory distress.     Breath sounds: Normal breath sounds.  Abdominal:     General: Bowel sounds are normal. There is no distension.     Palpations: Abdomen is soft.     Tenderness: There is no abdominal tenderness.     Comments: Peg tube present   Musculoskeletal:        General: Normal range of motion.     Cervical back: Neck supple.     Right lower leg: No edema.     Left lower leg: No edema.  Lymphadenopathy:     Cervical: No cervical adenopathy.  Skin:    General: Skin is warm and dry.  Neurological:     Mental Status: She is alert and oriented to person, place, and time.  Psychiatric:        Mood and Affect: Mood normal.     ASSESSMENT/ PLAN:  TODAY  Chronic diarrheal Short gut syndrome  Will increase lomotil to 2 tabs every 8 hours will monitor Her nutritional status remains poor.    Ok Edwards NP [facility]  call [phone]

## 2022-11-27 ENCOUNTER — Encounter: Payer: Self-pay | Admitting: Adult Health

## 2022-11-27 ENCOUNTER — Non-Acute Institutional Stay (SKILLED_NURSING_FACILITY): Payer: Medicare Other | Admitting: Adult Health

## 2022-11-27 ENCOUNTER — Other Ambulatory Visit (HOSPITAL_COMMUNITY)
Admission: RE | Admit: 2022-11-27 | Discharge: 2022-11-27 | Disposition: A | Discharge status: Home / Self Care | Payer: Medicare Other | Source: Skilled Nursing Facility | Attending: Adult Health | Admitting: Adult Health

## 2022-11-27 DIAGNOSIS — I10 Essential (primary) hypertension: Secondary | ICD-10-CM

## 2022-11-27 DIAGNOSIS — E271 Primary adrenocortical insufficiency: Secondary | ICD-10-CM

## 2022-11-27 DIAGNOSIS — D649 Anemia, unspecified: Secondary | ICD-10-CM

## 2022-11-27 DIAGNOSIS — D696 Thrombocytopenia, unspecified: Secondary | ICD-10-CM

## 2022-11-27 DIAGNOSIS — E871 Hypo-osmolality and hyponatremia: Secondary | ICD-10-CM

## 2022-11-27 DIAGNOSIS — G959 Disease of spinal cord, unspecified: Secondary | ICD-10-CM | POA: Diagnosis not present

## 2022-11-27 DIAGNOSIS — K529 Noninfective gastroenteritis and colitis, unspecified: Secondary | ICD-10-CM

## 2022-11-27 DIAGNOSIS — E46 Unspecified protein-calorie malnutrition: Secondary | ICD-10-CM

## 2022-11-27 DIAGNOSIS — N184 Chronic kidney disease, stage 4 (severe): Secondary | ICD-10-CM | POA: Diagnosis not present

## 2022-11-27 DIAGNOSIS — E43 Unspecified severe protein-calorie malnutrition: Secondary | ICD-10-CM

## 2022-11-27 DIAGNOSIS — K219 Gastro-esophageal reflux disease without esophagitis: Secondary | ICD-10-CM

## 2022-11-27 DIAGNOSIS — E8809 Other disorders of plasma-protein metabolism, not elsewhere classified: Secondary | ICD-10-CM

## 2022-11-27 DIAGNOSIS — I480 Paroxysmal atrial fibrillation: Secondary | ICD-10-CM

## 2022-11-27 DIAGNOSIS — K90821 Short bowel syndrome with colon in continuity: Secondary | ICD-10-CM

## 2022-11-27 DIAGNOSIS — E039 Hypothyroidism, unspecified: Secondary | ICD-10-CM

## 2022-11-27 DIAGNOSIS — J9611 Chronic respiratory failure with hypoxia: Secondary | ICD-10-CM

## 2022-11-27 DIAGNOSIS — F339 Major depressive disorder, recurrent, unspecified: Secondary | ICD-10-CM

## 2022-11-27 DIAGNOSIS — C670 Malignant neoplasm of trigone of bladder: Secondary | ICD-10-CM | POA: Diagnosis not present

## 2022-11-27 DIAGNOSIS — N390 Urinary tract infection, site not specified: Secondary | ICD-10-CM

## 2022-11-27 LAB — PREALBUMIN: Prealbumin: 36 mg/dL (ref 18–38)

## 2022-11-27 LAB — CBC
HCT: 24.7 % — ABNORMAL LOW (ref 36.0–46.0)
Hemoglobin: 7.6 g/dL — ABNORMAL LOW (ref 12.0–15.0)
MCH: 28.7 pg (ref 26.0–34.0)
MCHC: 30.8 g/dL (ref 30.0–36.0)
MCV: 93.2 fL (ref 80.0–100.0)
Platelets: 98 10*3/uL — ABNORMAL LOW (ref 150–400)
RBC: 2.65 MIL/uL — ABNORMAL LOW (ref 3.87–5.11)
RDW: 22.8 % — ABNORMAL HIGH (ref 11.5–15.5)
WBC: 5 10*3/uL (ref 4.0–10.5)
nRBC: 0 % (ref 0.0–0.2)

## 2022-11-27 LAB — COMPREHENSIVE METABOLIC PANEL
ALT: 12 U/L (ref 0–44)
AST: 11 U/L — ABNORMAL LOW (ref 15–41)
Albumin: 2.5 g/dL — ABNORMAL LOW (ref 3.5–5.0)
Alkaline Phosphatase: 60 U/L (ref 38–126)
Anion gap: 7 (ref 5–15)
BUN: 58 mg/dL — ABNORMAL HIGH (ref 8–23)
CO2: 17 mmol/L — ABNORMAL LOW (ref 22–32)
Calcium: 8.3 mg/dL — ABNORMAL LOW (ref 8.9–10.3)
Chloride: 113 mmol/L — ABNORMAL HIGH (ref 98–111)
Creatinine, Ser: 0.92 mg/dL (ref 0.44–1.00)
GFR, Estimated: 57 mL/min — ABNORMAL LOW (ref >60)
Glucose, Bld: 111 mg/dL — ABNORMAL HIGH (ref 70–99)
Potassium: 4.2 mmol/L (ref 3.5–5.1)
Sodium: 137 mmol/L (ref 135–145)
Total Bilirubin: 0.8 mg/dL (ref 0.3–1.2)
Total Protein: 5.2 g/dL — ABNORMAL LOW (ref 6.5–8.1)

## 2022-11-27 LAB — OCCULT BLOOD X 1 CARD TO LAB, STOOL: Fecal Occult Bld: NEGATIVE

## 2022-11-27 LAB — CORTISOL: Cortisol, Plasma: 16.2 ug/dL

## 2022-11-27 NOTE — Progress Notes (Signed)
Location:  [facility] Room Number: 161 Place of Service:  SNF (31) Provider:  Ok Edwards, NP   CODE STATUS: DNR  Allergies  Allergen Reactions   Codeine Nausea And Vomiting    Chief Complaint  Patient presents with   Medical Management of Chronic Issues                                  Atrial fibrillation paroxsymal: Primary hypertension: Chronic respiratory failure with hypoxia:  Short bowel syndrome with colon in continuity/ gastroesophageal reflux disease/chronic diarrhea:     HPI:  She is a 88 year old short term rehab patient being seen for the management of her chronic illnesses: Atrial fibrillation paroxsymal: Primary hypertension: Chronic respiratory failure with hypoxia:  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 She continues to have frequent diarrheal stools. Her absorption is poor; her albumin is now 2.5.  she continues to tube feeding. I have spoken with her niece regarding her nutritional status. She is in agreement to rest her bowel and to begin TPN to help with nutrition and strengthening her status.    Past Medical History:  Diagnosis Date   Abdominal adhesions 1994   Allergic rhinitis    Anemia    Anxiety and depression    Aortic stenosis    Arthritis    Atrial fibrillation ([place]) 10/2012   Associated with severe anemia and esophageal pill impaction   Breast carcinoma ([facility])    Right mastectomy   Cholelithiasis    Essential hypertension    Gastroesophageal reflux disease    Hiatal hernia   History of blood transfusion    Hypothyroidism    Low back pain    Malabsorption    Short gut syndrome following small bowel resection surgery x2   Nephrolithiasis 2004   Painless hematuria   Short gut syndrome    Bowel resection , 2004   Upper GI bleed 2004   Multiple episodes of melena-? due to gastritis or adverse drug effect (nonsteroidals, small bowel ulceration with Fosamax);  caused by Pepto-Bismol during one Emergency Department evaluation    Past Surgical History:  Procedure Laterality Date   ABDOMINAL HYSTERECTOMY  12/15/1958   massive gynecologic bleeding post delivery   ANTERIOR CERVICAL DECOMP/DISCECTOMY FUSION N/A 10/17/2022   Procedure: Anterior Cervical Discectomy and Fusion Cervical Three-Four/Cervical Four-Five;  Surgeon: Earnie Larsson, MD;  Location: [facility];  Service: Neurosurgery;  Laterality: N/A;  3C   BOWEL RESECTION     Resulting short gut syndrome   CHOLECYSTECTOMY N/A 06/25/2018   Procedure: LAPAROSCOPIC CHOLECYSTECTOMY WITH INTRAOPERATIVE CHOLANGIOGRAM;  Surgeon: Armandina Gemma, MD;  [facility];  Service: General;  Laterality: N/A;   COLONOSCOPY W/ POLYPECTOMY  12/16/2003   Lipoma; diverticulosis   COLONOSCOPY WITH ESOPHAGOGASTRODUODENOSCOPY (EGD)  11/22/2012   Re[facility]   CYSTOSCOPY W/ RETROGRADES Bilateral 04/16/2022   Procedure: CYSTOSCOPY;  Surgeon: Primus Bravo., MD;  [facility];  Service: Urology;  Laterality: Bilateral;   ESOPHAGOGASTRODUODENOSCOPY (EGD) WITH PROPOFOL N/A 04/04/2022   Procedure: ESOPHAGOGASTRODUODENOSCOPY (EGD) WITH PROPOFOL;  Surgeon: Rogene Houston, MD;  [facility];  Service: Endoscopy;  Laterality: N/A;  210   HIP ARTHROPLASTY Right 06/15/2020   Procedure: ANTERIOR ARTHROPLASTY BIPOLAR HIP (HEMIARTHROPLASTY);  Surgeon: Mcarthur Rossetti, MD;  [facility];  Service: Orthopedics;  Laterality: Right;   IR GASTROSTOMY TUBE MOD SED  10/29/2022   LAPAROSCOPIC  LYSIS OF ADHESIONS  12/16/1963   s/p adhesions   MASTECTOMY Right    Carcinoma of the breast; right   TRANSURETHRAL RESECTION OF BLADDER TUMOR N/A 04/16/2022   Procedure: TRANSURETHRAL RESECTION OF BLADDER TUMOR (TURBT);  Surgeon: Primus Bravo., MD;  [facility];  Service: Urology;  Laterality: N/A;   UPPER GASTROINTESTINAL ENDOSCOPY      Social History   Socioeconomic History   Marital status: Widowed     Spouse name: Not on file   Number of children: Not on file   Years of education: 12   Highest education level: Not on file  Occupational History   Not on file  Tobacco Use   Smoking status: Former    Packs/day: 1.50    Years: 20.00    Total pack years: 30.00    Types: Cigarettes    Passive exposure: Never   Smokeless tobacco: Never  Vaping Use   Vaping Use: Never used  Substance and Sexual Activity   Alcohol use: No   Drug use: No   Sexual activity: Not Currently    Birth control/protection: Post-menopausal, Surgical    Comment: hyst  Other Topics Concern   Not on file  Social History Narrative   Not on file   Social Determinants of Health   Financial Resource Strain: Not on file  Food Insecurity: No Food Insecurity (10/20/2022)   Hunger Vital Sign    Worried About Running Out of Food in the Last Year: Never true    Ran Out of Food in the Last Year: Never true  Transportation Needs: No Transportation Needs (10/20/2022)   PRAPARE - Hydrologist (Medical): No    Lack of Transportation (Non-Medical): No  Physical Activity: Not on file  Stress: Not on file  Social Connections: Not on file  Intimate Partner Violence: Not At Risk (10/20/2022)   Humiliation, Afraid, Rape, and Kick questionnaire    Fear of Current or Ex-Partner: No    Emotionally Abused: No    Physically Abused: No    Sexually Abused: No   Family History  Problem Relation Age of Onset   Anuerysm Father    Rheum arthritis Sister    Healthy Sister    COPD Sister    Healthy Brother    Cancer Other    Colon cancer Neg Hx       VITAL SIGNS BP (!) 101/59   Pulse (!) 59   Temp (!) 97.4 F (36.3 C)   Resp 20   Ht '5\' 1"'$  (1.549 m)   Wt 109 lb 12.8 oz (49.8 kg)   SpO2 97%   BMI 20.75 kg/m   Outpatient Encounter Medications as of 11/27/2022  Medication Sig Note   acetaminophen (TYLENOL) 500 MG tablet Take 500 mg by mouth every 8 (eight) hours.    ALPRAZolam (XANAX) 0.5  MG tablet Take 2 tablets (1 mg total) by mouth at bedtime.    amLODipine (NORVASC) 10 MG tablet Place 1 tablet (10 mg total) into feeding tube daily.    clotrimazole (LOTRIMIN) 1 % cream Apply 1 Application topically.  apply to sacral area and buttocks after each incontinent episode to be followed by zinc oxide with incontinent episode    diphenoxylate-atropine (LOMOTIL) 2.5-0.025 MG tablet Take 2 tablets by mouth every 8 (eight) hours.    famotidine (PEPCID) 20 MG tablet Place 1 tablet (20 mg total) into feeding tube daily.    fiber supplement, BANATROL TF, liquid Place 60  mLs into feeding tube every 6 (six) hours.    fludrocortisone (FLORINEF) 0.'1mg'$ /mL SUSP Take 0.1 mg by mouth daily.    FOSFOMYCIN TROMETHAMINE PO Take 3 g by mouth. Once a day every 10 days    gabapentin (NEURONTIN) 250 MG/5ML solution Place 2 mLs (100 mg total) into feeding tube 2 (two) times daily at 10 am and 4 pm.    gabapentin (NEURONTIN) 250 MG/5ML solution Place 4 mLs (200 mg total) into feeding tube at bedtime.    Heparin Sod, Pork, Lock Flush (HEPARIN FLUSH) 10 UNIT/ML SOLN injection 3 mLs in the morning and at bedtime.    hydrocortisone (CORTEF) 5 MG tablet Take 5 mg by mouth daily. Give 7.5 mg in the AM and 5 mg in the PM    levothyroxine (SYNTHROID) 75 MCG tablet Take 1 tablet (75 mcg total) by mouth daily before breakfast.    loperamide (IMODIUM A-D) 2 MG tablet Take 2 mg by mouth every 4 (four) hours.    methocarbamol (ROBAXIN) 750 MG tablet Take 1 tablet (750 mg total) by mouth 4 (four) times daily.    metoprolol tartrate (LOPRESSOR) 25 MG tablet Take 75 mg by mouth 2 (two) times daily.    Multiple Vitamins-Iron (MULTIVITAMINS WITH IRON) TABS tablet Take 1 tablet by mouth every morning. 10/10/2022: Flinstone vitamin    sertraline (ZOLOFT) 50 MG tablet Take 50 mg by mouth daily.    sodium chloride 1 g tablet Take 1 g by mouth 2 (two) times daily.    sodium chloride flush (NS) 0.9 % SOLN Inject 5 mLs into the vein 2  (two) times daily.    ZINC OXIDE EX Apply topically. Apply to sacrum and perineal areas due to redness    No facility-administered encounter medications on file as of 11/27/2022.     SIGNIFICANT DIAGNOSTIC EXAMS  PREVIOUS   11-14-22: renal/adrenal ultrasound:  1. Moderate left hydronephrosis, increased from previous ultrasound but similar to more recent CT. 2. Probable dependent bladder mass with internal blood flow suspicious for recurrent bladder malignancy, especially given the patient's history. The adrenal glands  was suboptimally evaluated on recent CT due to artifact from the right hip replacement. If not recently performed, recommend cystoscopy. 3. Right renal staghorn calculus, bilateral renal cysts and renal cortical thinning and increased echogenicity, as before, compatible with chronic medical renal disease.  NO NEW EXAMS     LABS REVIEWED: PREVIOUS   11-06-22; wbc 10.6; hgb 10.7; hct 33.7; mcv 86.4 plt 187; glucose 114; bun 62; creat 1.26; k+ 5.9; na++ 133; ca 8.5; gfr 39; mag 2.5; phos 5.1  11-10-22: glucose 104; bun 72; creat 1.19; k+ 5.8; na++ 132; ca 8.4; gfr 42; phos 4.8; albumin 3.3; mag 2.6 11-11-22: glucose 117; bun 71; creat 1.06 ;k+ 5.6; na++ 135; ca 8.1; gfr 48; phos 5.0; albumin 3.1; mag 2.5; cortisol 1.0    TODAY  11-24-22: glucose 106; bun 66; creat 0.95; k+ 4.3; na++ 138; ca 8.1 gfr 55; phos 3.4 albumin 2.7  11-27-22: wbc 5.0; hgb 7.6; hct 24.7; mcv 93.2 plt 98; glucose 111; bun 58; creat 0.92; k+ 4.2; na++ 137; ca 8.3; gfr 57; protein 5.2 albumin 2.5 pre-albumin 36; cortisol 16.2; hemoccult stool: neg   Review of Systems  Constitutional:  Negative for malaise/fatigue.  Respiratory:  Negative for cough and shortness of breath.   Cardiovascular:  Negative for chest pain, palpitations and leg swelling.  Gastrointestinal:  Positive for diarrhea. Negative for abdominal pain and heartburn.  Musculoskeletal:  Negative  for back pain, joint pain and myalgias.   Skin: Negative.   Neurological:  Negative for dizziness.  Psychiatric/Behavioral:  The patient is not nervous/anxious.     Physical Exam Constitutional:      General: She is not in acute distress.    Appearance: She is underweight. She is not diaphoretic.  Neck:     Thyroid: No thyromegaly.  Cardiovascular:     Rate and Rhythm: Normal rate and regular rhythm.     Pulses: Normal pulses.     Heart sounds: Normal heart sounds.  Pulmonary:     Effort: Pulmonary effort is normal. No respiratory distress.     Breath sounds: Normal breath sounds.  Abdominal:     General: Bowel sounds are normal. There is no distension.     Palpations: Abdomen is soft.     Tenderness: There is no abdominal tenderness.     Comments: Peg tube   Musculoskeletal:        General: Normal range of motion.     Cervical back: Neck supple.     Right lower leg: No edema.     Left lower leg: No edema.     Comments: C-collar remains in place   Lymphadenopathy:     Cervical: No cervical adenopathy.  Skin:    General: Skin is warm and dry.  Neurological:     Mental Status: She is alert. Mental status is at baseline.  Psychiatric:        Mood and Affect: Mood normal.       ASSESSMENT/ PLAN:  TODAY  Atrial fibrillation paroxsymal: heart rate is stable will continue lopressor 75 mg twice daily for rate control  2. Primary hypertension: b/p 101/59 will continue lopressor 75 mg twice daily; norvasc 10 mg daily   3. Chronic respiratory failure with hypoxia: is on room air  4. 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 Addison disease: cortisol 16.2; will continue cortef 7.5 mg in the AM and 5 mg in the PM; florinef 0.1 mg daily   6. Acquired hypothyroidism: will continue synthroid 75 mcg daily   7. Cervical myelopathy: will  continue tylenol 500 mg every 8 hours; gabapentin 100 mg twice daily and 200 mg nightly; robaxin 750 mg four times daily   8. 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 CKD (Chronic kidney disease) stage IV: bun 58; creat 0.92; gfr 57  10. Protein calorie malnutrition severe hypoalbuminemia due to protein calorie malnutrition  : is on tube feeding at this time. She has very poor absorption of nutrition; I have spoken with her niece regarding TPN and is in agreement with starting this plan.   11. Thrombocytopenia: plt 98  12. Major depression recurrent chronic: will continue zoloft 50 mg daily and xanax 1 mg nightly   13. Normocytic anemia: is worse; hgb 7.6 will hemoccult stool X 3  14. Hyponatremia: na++ 137 will continue nacl 1 gm twice daily   15. Frequent UTI: will continue fosofomycin 3 gm every 10 days.   Will check hgb/hct; cmp; mag/phos    Ok Edwards NP [facility]  call [phone]

## 2022-11-28 ENCOUNTER — Other Ambulatory Visit (HOSPITAL_COMMUNITY)
Admission: RE | Admit: 2022-11-28 | Discharge: 2022-11-28 | Disposition: A | Discharge status: Home / Self Care | Payer: Medicare Other | Source: Skilled Nursing Facility | Attending: Adult Health | Admitting: Adult Health

## 2022-11-28 DIAGNOSIS — I132 Hypertensive heart and chronic kidney disease with heart failure and with stage 5 chronic kidney disease, or end stage renal disease: Secondary | ICD-10-CM | POA: Insufficient documentation

## 2022-11-28 LAB — COMPREHENSIVE METABOLIC PANEL
ALT: 14 U/L (ref 0–44)
AST: 12 U/L — ABNORMAL LOW (ref 15–41)
Albumin: 2.5 g/dL — ABNORMAL LOW (ref 3.5–5.0)
Alkaline Phosphatase: 55 U/L (ref 38–126)
Anion gap: 6 (ref 5–15)
BUN: 52 mg/dL — ABNORMAL HIGH (ref 8–23)
CO2: 18 mmol/L — ABNORMAL LOW (ref 22–32)
Calcium: 8.4 mg/dL — ABNORMAL LOW (ref 8.9–10.3)
Chloride: 116 mmol/L — ABNORMAL HIGH (ref 98–111)
Creatinine, Ser: 0.9 mg/dL (ref 0.44–1.00)
GFR, Estimated: 59 mL/min — ABNORMAL LOW (ref >60)
Glucose, Bld: 119 mg/dL — ABNORMAL HIGH (ref 70–99)
Potassium: 3.8 mmol/L (ref 3.5–5.1)
Sodium: 140 mmol/L (ref 135–145)
Total Bilirubin: 0.7 mg/dL (ref 0.3–1.2)
Total Protein: 5.3 g/dL — ABNORMAL LOW (ref 6.5–8.1)

## 2022-11-28 LAB — PHOSPHORUS: Phosphorus: 3.9 mg/dL (ref 2.5–4.6)

## 2022-11-28 LAB — HEMOGLOBIN AND HEMATOCRIT, BLOOD
HCT: 26.3 % — ABNORMAL LOW (ref 36.0–46.0)
Hemoglobin: 8 g/dL — ABNORMAL LOW (ref 12.0–15.0)

## 2022-11-28 LAB — MAGNESIUM: Magnesium: 2 mg/dL (ref 1.7–2.4)

## 2022-11-30 ENCOUNTER — Other Ambulatory Visit (HOSPITAL_COMMUNITY)
Admission: RE | Admit: 2022-11-30 | Discharge: 2022-11-30 | Disposition: A | Discharge status: Home / Self Care | Payer: Medicare Other | Source: Skilled Nursing Facility | Attending: Adult Health | Admitting: Adult Health

## 2022-11-30 DIAGNOSIS — D649 Anemia, unspecified: Secondary | ICD-10-CM | POA: Insufficient documentation

## 2022-11-30 LAB — OCCULT BLOOD X 1 CARD TO LAB, STOOL: Fecal Occult Bld: POSITIVE — AB

## 2022-12-01 DIAGNOSIS — K90829 Short bowel syndrome, unspecified: Secondary | ICD-10-CM | POA: Insufficient documentation

## 2022-12-02 ENCOUNTER — Ambulatory Visit (HOSPITAL_COMMUNITY)
Admission: RE | Admit: 2022-12-02 | Discharge: 2022-12-02 | Disposition: A | Discharge status: Home / Self Care | Payer: Medicare Other | Source: Ambulatory Visit | Attending: Adult Health | Admitting: Adult Health

## 2022-12-02 ENCOUNTER — Encounter (HOSPITAL_COMMUNITY): Payer: Self-pay | Admitting: Speech Pathology

## 2022-12-02 ENCOUNTER — Other Ambulatory Visit (HOSPITAL_COMMUNITY): Payer: Self-pay | Admitting: Adult Health

## 2022-12-02 ENCOUNTER — Ambulatory Visit (HOSPITAL_COMMUNITY): Payer: Medicare Other | Attending: Internal Medicine | Admitting: Speech Pathology

## 2022-12-02 DIAGNOSIS — R1310 Dysphagia, unspecified: Secondary | ICD-10-CM | POA: Insufficient documentation

## 2022-12-02 DIAGNOSIS — N390 Urinary tract infection, site not specified: Secondary | ICD-10-CM | POA: Insufficient documentation

## 2022-12-02 DIAGNOSIS — R1312 Dysphagia, oropharyngeal phase: Secondary | ICD-10-CM | POA: Insufficient documentation

## 2022-12-02 DIAGNOSIS — J9611 Chronic respiratory failure with hypoxia: Secondary | ICD-10-CM | POA: Insufficient documentation

## 2022-12-02 NOTE — Therapy (Signed)
[place], [place], [address] Phone: [phone]   Fax:  [phone]  Modified Barium Swallow  Patient Details  Name: Jane Andrews MRN: [id number] Date of Birth: 03-31-1925 No data recorded  Encounter Date: 12/02/2022   End of Session - 12/02/22 1905     Visit Number 1    Number of Visits 1    Authorization Type Medicare    SLP Start Time 1330    SLP Stop Time  1400    SLP Time Calculation (min) 30 min    Activity Tolerance Patient tolerated treatment well             Past Medical History:  Diagnosis Date   Abdominal adhesions 1994   Allergic rhinitis    Anemia    Anxiety and depression    Aortic stenosis    Arthritis    Atrial fibrillation ([place]) 10/2012   Associated with severe anemia and esophageal pill impaction   Breast carcinoma ([place])    Right mastectomy   Cholelithiasis    Essential hypertension    Gastroesophageal reflux disease    Hiatal hernia   History of blood transfusion    Hypothyroidism    Low back pain    Malabsorption    Short gut syndrome following small bowel resection surgery x2   Nephrolithiasis 2004   Painless hematuria   Short gut syndrome    Bowel resection , 2004   Upper GI bleed 2004   Multiple episodes of melena-? due to gastritis or adverse drug effect (nonsteroidals, small bowel ulceration with Fosamax); caused by Pepto-Bismol during one Emergency Department evaluation    Past Surgical History:  Procedure Laterality Date   ABDOMINAL HYSTERECTOMY  12/15/1958   massive gynecologic bleeding post delivery   ANTERIOR CERVICAL DECOMP/DISCECTOMY FUSION N/A 10/17/2022   Procedure: Anterior Cervical Discectomy and Fusion Cervical Three-Four/Cervical Four-Five;  Surgeon: Earnie Larsson, MD;  Location: [facility];  Service: Neurosurgery;  Laterality: N/A;  3C   BOWEL RESECTION     Resulting short gut syndrome   CHOLECYSTECTOMY N/A 06/25/2018   Procedure: LAPAROSCOPIC  CHOLECYSTECTOMY WITH INTRAOPERATIVE CHOLANGIOGRAM;  Surgeon: Armandina Gemma, MD;  Location: [facility];  Service: General;  Laterality: N/A;   COLONOSCOPY W/ POLYPECTOMY  12/16/2003   Lipoma; diverticulosis   COLONOSCOPY WITH ESOPHAGOGASTRODUODENOSCOPY (EGD)  11/22/2012   [facility]   CYSTOSCOPY W/ RETROGRADES Bilateral 04/16/2022   Procedure: CYSTOSCOPY;  Surgeon: Primus Bravo., MD;  Location: [facility];  Service: Urology;  Laterality: Bilateral;   ESOPHAGOGASTRODUODENOSCOPY (EGD) WITH PROPOFOL N/A 04/04/2022   Procedure: ESOPHAGOGASTRODUODENOSCOPY (EGD) WITH PROPOFOL;  Surgeon: Rogene Houston, MD;  Location: [facility];  Service: Endoscopy;  Laterality: N/A;  210   HIP ARTHROPLASTY Right 06/15/2020   Procedure: ANTERIOR ARTHROPLASTY BIPOLAR HIP (HEMIARTHROPLASTY);  Surgeon: Mcarthur Rossetti, MD;  Location: [facility];  Service: Orthopedics;  Laterality: Right;   IR GASTROSTOMY TUBE MOD SED  10/29/2022   LAPAROSCOPIC LYSIS OF ADHESIONS  12/16/1963   s/p adhesions   MASTECTOMY Right    Carcinoma of the breast; right   TRANSURETHRAL RESECTION OF BLADDER TUMOR N/A 04/16/2022   Procedure: TRANSURETHRAL RESECTION OF BLADDER TUMOR (TURBT);  Surgeon: Primus Bravo., MD;  Location: [facility];  Service: Urology;  Laterality: N/A;   UPPER GASTROINTESTINAL ENDOSCOPY      MBSS from 10/27/2022 while in acute:<<Pt continues to present with moderate pharyngeal dysphagia with seemingly slight improvement in posterior pharyngeal wall edema, however Pt continues to  be at high risk for aspiration. Pt with mild premature spillage of liquids, delay in swallow trigger at the level of the valleculae. Pt with reduced tongue base retraction and epiglottic deflection with poor pharyngeal constriction. Pt able to swallow/clear ~50% of each bolus during initial swallow, however the remaining 50% is left in the pharynx and at times penetrated and aspirated. Pt turned AP and pharyngeal transit was noted to be greater  on the left side. Head turn to the right was only mildly effective in reducing pharyngeal residue. Suspect a transition to an oral diet is going to be longer than anticipated and Pt now deconditioned. Recommend strong consideration of alternative means of nutrition- consider PEG so that Pt can go to SNF for therapy. Prognosis for oral diet is good with time and therapy. Recommend NPO except for ice chips after oral care when Pt is alert and upright.>>   There were no vitals filed for this visit.   General - 12/02/22 1827       General Information   Date of Onset 10/17/22    HPI Jane Andrews is a 88 yo female who was hospitalized 11/3-11/4 for C3-4, C4-5 anterior cervical discectomy and fusion for cervical stenosis with myelopathy.  She had been discharged home with a soft neck brace with bedrest. She was hospitalized 11/5 - 10/31/2022 with dyspnea attributed to community-acquired pneumonia. She developed post op dysphagia from her procedure and had MBSS completed at [facility] while in acute setting on 11/7 (NPO) and 10/27/22 (NPO except ice chips and trials puree with SLP). She was referred for repeat MBSS by Dr. Unice Cobble (she is at [facility] for rehab).    Type of Study MBS-Modified Barium Swallow Study    Previous Swallow Assessment MBSS: 11/7 (NPO) and 10/27/22 (NPO except ice chips and trials puree with SLP)    Diet Prior to this Study NPO    Temperature Spikes Noted No    Respiratory Status Room air    History of Recent Intubation No    Behavior/Cognition Alert;Cooperative;Pleasant mood    Oral Cavity Assessment Other (comment)   coating on lingual surface   Oral Care Completed by SLP Yes    Oral Cavity - Dentition Adequate natural dentition    Vision Functional for self feeding    Self-Feeding Abilities Needs assist    Patient Positioning Upright in chair    Baseline Vocal Quality Normal    Volitional Cough Strong    Volitional Swallow Able to elicit    Anatomy Within functional  limits    Pharyngeal Secretions Not observed secondary MBS                Oral Preparation/Oral Phase - 12/02/22 1839       Oral Preparation/Oral Phase   Oral Phase Impaired      Oral - Nectar   Oral - Nectar Straw Within functional limits      Oral - Thin   Oral - Thin Teaspoon Within functional limits    Oral - Thin Cup Within functional limits    Oral - Thin Straw Within functional limits      Oral - Solids   Oral - Puree Oral residue    Oral - Regular Oral residue    Oral - Pill Within functional limits      Electrical stimulation - Oral Phase   Was Electrical Stimulation Used No              Pharyngeal Phase - 12/02/22  1856       Pharyngeal Phase   Pharyngeal Phase Impaired      Pharyngeal - Nectar   Pharyngeal- Nectar Straw Swallow initiation at vallecula;Swallow initiation at pyriform sinus;Reduced epiglottic inversion;Pharyngeal residue - valleculae;Pharyngeal residue - pyriform      Pharyngeal - Thin   Pharyngeal- Thin Teaspoon Swallow initiation at vallecula;Swallow initiation at pyriform sinus;Reduced epiglottic inversion;Reduced tongue base retraction    Pharyngeal- Thin Cup Swallow initiation at pyriform sinus;Reduced epiglottic inversion;Reduced tongue base retraction;Pharyngeal residue - valleculae;Pharyngeal residue - pyriform;Penetration/Aspiration during swallow    Pharyngeal Material enters airway, remains ABOVE vocal cords then ejected out    Pharyngeal- Thin Straw Swallow initiation at pyriform sinus;Reduced epiglottic inversion;Reduced airway/laryngeal closure;Reduced tongue base retraction;Penetration/Aspiration during swallow;Trace aspiration;Pharyngeal residue - valleculae;Pharyngeal residue - pyriform    Pharyngeal Material enters airway, passes BELOW cords then ejected out      Pharyngeal - Solids   Pharyngeal- Puree Swallow initiation at vallecula;Reduced epiglottic inversion;Reduced tongue base retraction;Pharyngeal residue -  valleculae    Pharyngeal- Regular Delayed swallow initiation-vallecula;Reduced tongue base retraction;Pharyngeal residue - valleculae    Pharyngeal- Pill Pharyngeal residue - valleculae   trace aspiration of cup thin, but removed when taking pill. Pill remained in valleculae, but eventually cleared with puree wash     Pharyngeal Phase - Comment   Pharyngeal Comment reduced tongue base retraction and reduced epiglottic deflection      Electrical Stimulation - Pharyngeal Phase   Was Electrical Stimulation Used No              Cricopharyngeal Phase - 12/02/22 1901       Cervical Esophageal Phase   Cervical Esophageal Phase Within functional limits   brief stasis of barium tablet in distal esophagus, but eventually cleared              Plan - 12/02/22 1905     Clinical Impression Statement Pt with improved oropharyngeal swallow function compared to her last MBSS on 10/27/2022. Current swallow is characterized by reduced tongue base retraction (weakness), reduced epiglottic deflection, and reduced laryngeal vestibule closure resulting in trace aspiration of thins when taking straw sip and when taking barium tablet (but spontaneously removed), variable mild (thins) to severe (regular textures) vallecular residue after the swallow. The barium tablet became trapped in the valleculae when it was taken with a cup sip of thin and was not removed with liquid wash. It was eventually removed after several trials of puree wash. The graham cracker was eventually cleared from the valleculae after lots of thin wash. Head turn to the Right after the residual was already in the valleculae was NOT effective in reducing or eliminating residue. It did appear to be effective when the head turn to the RIGHT occurred during the primary swallow of bolus (ie, turn your head to the RIGHT and then swallow). I would caution implementing head turn to the Right as a maneuver because Pt likely needs to utilize those  muscles to help improve to a more "normal" swallow. It sounds as if she has had very minimal dysphagia therapy and PO trials since she was discharged from the acute setting. Recommend D1/puree and thin liquids via cup sips, crush medications as able in puree (if a pill cannot be crushed, recommend swallowing it whole in puree with her head turned to her Right and follow with liquid wash) and follow with liquid wash. Suspect Pt now experiencing muscle disuse atrophy and being on an oral diet should help her pharyngeal swallow. Her  tongue is also coated and she needs assistance with oral care. Pt can likely be upgraded to other textures clinically by SLP.    Treatment/Interventions Compensatory techniques;Pharyngeal strengthening exercises;SLP instruction and feedback;Trials of upgraded texture/liquids;Diet toleration management by SLP;Patient/family education;Compensatory strategies    Potential to Achieve Goals Good    Consulted and Agree with Plan of Care Patient;Family member/caregiver             Patient will benefit from skilled therapeutic intervention in order to improve the following deficits and impairments:   Dysphagia, oropharyngeal phase     Recommendations/Treatment - 12/02/22 1902       Swallow Evaluation Recommendations   SLP Diet Recommendations Thin;Dysphagia 1 (puree)    Liquid Administration via Cup;No straw    Medication Administration Via alternative means   If a pill must be taken whole, place in puree and cue Pt to turn her head to her left when she swallows and then follow with puree wash   Supervision Patient able to self feed;Staff to assist with self feeding   hand weakness?   Compensations Slow rate;Small sips/bites;Multiple dry swallows after each bite/sip;Follow solids with liquid;Effortful swallow    Postural Changes Seated upright at 90 degrees;Remain upright for at least 30 minutes after feeds/meals              Prognosis - 12/02/22 1903        Prognosis   Prognosis for Safe Diet Advancement Good      Individuals Consulted   Consulted and Agree with Results and Recommendations Patient;Family member/caregiver    Family Member Consulted Niece    Report Sent to  Referring physician;Facility (Comment)             Problem List Patient Active Problem List   Diagnosis Date Noted   Chronic respiratory failure with hypoxia ([place]) 12/02/2022   Frequent UTI 12/02/2022   Short gut syndrome 12/01/2022   Addison disease ([place]) 11/14/2022   Hyponatremia 11/12/2022   Hypermagnesemia 11/12/2022   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 ([place]); 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; s/p TURBT 5/23; s/p palliative radiation tx 6/23 11/11/2022   History of UTI 11/11/2022   Hydronephrosis of left kidney - likely secondary to muscle invasive bladder cancer 11/11/2022   Hyperglycemia, drug-induced 11/04/2022   Chronic kidney disease, stage 3b ([place]) 10/29/2022   Protein-calorie malnutrition, severe 10/27/2022   Acute respiratory failure with hypoxia ([place]) 10/20/2022   Overactive bladder 10/20/2022   Hypoalbuminemia due to protein-calorie malnutrition ([place]) 10/20/2022   Anxiety 10/20/2022   Depression 10/20/2022   Cervical stenosis of spinal canal 10/20/2022   CAP (community acquired pneumonia) 10/19/2022   Cervical myelopathy ([place]) 10/17/2022   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 ([place]) 05/23/2022   Central cord syndrome of cervical spinal cord ([place]) 05/19/2022   SAH (subarachnoid hemorrhage) ([place]) 05/13/2022   TBI (traumatic brain injury) ([place]) 05/13/2022   Bladder mass 04/16/2022   Hyperkalemia 04/09/2022   Mixed conductive and sensorineural hearing loss of both ears 03/03/2022   Normocytic anemia 02/11/2022   SIRS (systemic inflammatory response syndrome) ([place]) 02/10/2022   Sinusitis 02/10/2022   Nephrolithiasis 01/07/2022   Vulvar irritation 12/20/2021   Burning  with urination 12/20/2021   Symptoms of urinary tract infection 12/20/2021   Diarrhea 03/05/2021   Acute kidney failure, unspecified ([place]) 08/15/2020   Scoliosis 06/26/2020   Thrombocytopenia ([place]) 06/22/2020   Aspiration pneumonitis ([place]) 06/22/2020   Chronic constipation 06/21/2020   Post-menopausal  osteoporosis 06/21/2020   Essential tremor 06/21/2020   Vitamin B 12 deficiency 06/21/2020   Major depression, recurrent, chronic ([place]) 06/21/2020   Status post total replacement of right hip    AKI (acute kidney injury) ([place])    Postoperative anemia due to acute blood loss    Closed right hip fracture ([facility]) 06/14/2020   IDA (iron deficiency anemia) 02/28/2020   Atrial fibrillation with RVR ([place]) 12/25/2018   GERD (gastroesophageal reflux disease) 12/25/2018   Chronic cholecystitis s/p lap cholecystectomy 06/25/2018 06/18/2018   Vagal reaction 04/12/2016   Abdominal pain 09/19/2014   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

## 2022-12-03 ENCOUNTER — Non-Acute Institutional Stay (SKILLED_NURSING_FACILITY): Payer: Medicare Other | Admitting: Adult Health

## 2022-12-03 ENCOUNTER — Other Ambulatory Visit: Payer: Self-pay | Admitting: Adult Health

## 2022-12-03 ENCOUNTER — Encounter: Payer: Self-pay | Admitting: Adult Health

## 2022-12-03 DIAGNOSIS — R600 Localized edema: Secondary | ICD-10-CM

## 2022-12-03 DIAGNOSIS — R635 Abnormal weight gain: Secondary | ICD-10-CM | POA: Diagnosis not present

## 2022-12-03 MED ORDER — ALPRAZOLAM 0.5 MG PO TABS: 1.0000 mg | ORAL_TABLET | Freq: Every day | ORAL | 0 refills | Status: DC

## 2022-12-03 NOTE — Progress Notes (Signed)
Location:  [facility] Room Number: 131-P Place of Service:  SNF (31)   CODE STATUS: DNR  Allergies  Allergen Reactions   Codeine Nausea And Vomiting    Chief Complaint  Patient presents with   Acute Visit    Weight Gain    HPI:  She has been steadily gaining weight. 10-31-22: 106.2 pounds; 12-02-22: 116.6 pounds. She does have worsening lower extremity edema present. She continues on TPN for her nutritional status. She denies any shortness of breath. Does have a cough present.   Past Medical History:  Diagnosis Date   Abdominal adhesions 1994   Allergic rhinitis    Anemia    Anxiety and depression    Aortic stenosis    Arthritis    Atrial fibrillation ([place]) 10/2012   Associated with severe anemia and esophageal pill impaction   Breast carcinoma ([facility])    Right mastectomy   Cholelithiasis    Essential hypertension    Gastroesophageal reflux disease    Hiatal hernia   History of blood transfusion    Hypothyroidism    Low back pain    Malabsorption    Short gut syndrome following small bowel resection surgery x2   Nephrolithiasis 2004   Painless hematuria   Short gut syndrome    Bowel resection , 2004   Upper GI bleed 2004   Multiple episodes of melena-? due to gastritis or adverse drug effect (nonsteroidals, small bowel ulceration with Fosamax); caused by Pepto-Bismol during one Emergency Department evaluation    Past Surgical History:  Procedure Laterality Date   ABDOMINAL HYSTERECTOMY  12/15/1958   massive gynecologic bleeding post delivery   ANTERIOR CERVICAL DECOMP/DISCECTOMY FUSION N/A 10/17/2022   Procedure: Anterior Cervical Discectomy and Fusion Cervical Three-Four/Cervical Four-Five;  Surgeon: Earnie Larsson, MD;  Location: [facility];  Service: Neurosurgery;  Laterality: N/A;  3C   BOWEL RESECTION     Resulting short gut syndrome   CHOLECYSTECTOMY N/A 06/25/2018   Procedure: LAPAROSCOPIC CHOLECYSTECTOMY WITH INTRAOPERATIVE  CHOLANGIOGRAM;  Surgeon: Armandina Gemma, MD;  Location: [facility];  Service: General;  Laterality: N/A;   COLONOSCOPY W/ POLYPECTOMY  12/16/2003   Lipoma; diverticulosis   COLONOSCOPY WITH ESOPHAGOGASTRODUODENOSCOPY (EGD)  11/22/2012   [facility]   CYSTOSCOPY W/ RETROGRADES Bilateral 04/16/2022   Procedure: CYSTOSCOPY;  Surgeon: Primus Bravo., MD;  Location: [facility];  Service: Urology;  Laterality: Bilateral;   ESOPHAGOGASTRODUODENOSCOPY (EGD) WITH PROPOFOL N/A 04/04/2022   Procedure: ESOPHAGOGASTRODUODENOSCOPY (EGD) WITH PROPOFOL;  Surgeon: Rogene Houston, MD;  Location: [facility];  Service: Endoscopy;  Laterality: N/A;  210   HIP ARTHROPLASTY Right 06/15/2020   Procedure: ANTERIOR ARTHROPLASTY BIPOLAR HIP (HEMIARTHROPLASTY);  Surgeon: Mcarthur Rossetti, MD;  Location: [facility];  Service: Orthopedics;  Laterality: Right;   IR GASTROSTOMY TUBE MOD SED  10/29/2022   LAPAROSCOPIC LYSIS OF ADHESIONS  12/16/1963   s/p adhesions   MASTECTOMY Right    Carcinoma of the breast; right   TRANSURETHRAL RESECTION OF BLADDER TUMOR N/A 04/16/2022   Procedure: TRANSURETHRAL RESECTION OF BLADDER TUMOR (TURBT);  Surgeon: Primus Bravo., MD;  Location: [facility];  Service: Urology;  Laterality: N/A;   UPPER GASTROINTESTINAL ENDOSCOPY      Social History   Socioeconomic History   Marital status: Widowed    Spouse name: Not on file   Number of children: Not on file   Years of education: 12   Highest education level: Not on file  Occupational History   Not on file  Tobacco Use   Smoking status: Former    Packs/day: 1.50    Years: 20.00    Total pack years: 30.00    Types: Cigarettes    Passive exposure: Never   Smokeless tobacco: Never  Vaping Use   Vaping Use: Never used  Substance and Sexual Activity   Alcohol use: No   Drug use: No   Sexual activity: Not Currently    Birth control/protection: Post-menopausal, Surgical    Comment: hyst  Other Topics Concern   Not on file   Social History Narrative   Not on file   Social Determinants of Health   Financial Resource Strain: Not on file  Food Insecurity: No Food Insecurity (10/20/2022)   Hunger Vital Sign    Worried About Running Out of Food in the Last Year: Never true    Ran Out of Food in the Last Year: Never true  Transportation Needs: No Transportation Needs (10/20/2022)   PRAPARE - Hydrologist (Medical): No    Lack of Transportation (Non-Medical): No  Physical Activity: Not on file  Stress: Not on file  Social Connections: Not on file  Intimate Partner Violence: Not At Risk (10/20/2022)   Humiliation, Afraid, Rape, and Kick questionnaire    Fear of Current or Ex-Partner: No    Emotionally Abused: No    Physically Abused: No    Sexually Abused: No   Family History  Problem Relation Age of Onset   Anuerysm Father    Rheum arthritis Sister    Healthy Sister    COPD Sister    Healthy Brother    Cancer Other    Colon cancer Neg Hx       VITAL SIGNS BP 119/65   Pulse 72   Temp (!) 96.9 F (36.1 C)   Resp 18   Ht '5\' 1"'$  (1.549 m)   Wt 115 lb 3.2 oz (52.3 kg)   SpO2 95%   BMI 21.77 kg/m   Outpatient Encounter Medications as of 12/03/2022  Medication Sig Note   acetaminophen (TYLENOL) 500 MG tablet Take 500 mg by mouth every 8 (eight) hours.    ALPRAZolam (XANAX) 0.5 MG tablet Take 2 tablets (1 mg total) by mouth at bedtime.    Amino Acid Infusion in D10W (CLINIMIX/DEXTROSE, 4.25/10, IV) Inject 100 mLs into the vein in the morning and at bedtime.    amLODipine (NORVASC) 10 MG tablet Place 1 tablet (10 mg total) into feeding tube daily.    clotrimazole (LOTRIMIN) 1 % cream Apply 1 Application topically.  apply to sacral area and buttocks after each incontinent episode to be followed by zinc oxide with incontinent episode    diphenoxylate-atropine (LOMOTIL) 2.5-0.025 MG tablet Take 2 tablets by mouth every 8 (eight) hours.    famotidine (PEPCID) 20 MG tablet  Place 1 tablet (20 mg total) into feeding tube daily.    fludrocortisone (FLORINEF) 0.'1mg'$ /mL SUSP Take 0.1 mg by mouth daily.    FOSFOMYCIN TROMETHAMINE PO Take 3 g by mouth. Once a day every 10 days    gabapentin (NEURONTIN) 250 MG/5ML solution Place 2 mLs (100 mg total) into feeding tube 2 (two) times daily at 10 am and 4 pm.    gabapentin (NEURONTIN) 250 MG/5ML solution Place 4 mLs (200 mg total) into feeding tube at bedtime.    Heparin Sod, Pork, Lock Flush (HEPARIN FLUSH) 10 UNIT/ML SOLN injection 3 mLs in the morning and at bedtime.    hydrocortisone (CORTEF) 5  MG tablet Take 5 mg by mouth daily. Give 7.5 mg in the AM and 5 mg in the PM    levothyroxine (SYNTHROID) 75 MCG tablet Take 1 tablet (75 mcg total) by mouth daily before breakfast.    loperamide (IMODIUM A-D) 2 MG tablet Take 2 mg by mouth every 4 (four) hours.    methocarbamol (ROBAXIN) 750 MG tablet Take 1 tablet (750 mg total) by mouth 4 (four) times daily.    metoprolol tartrate (LOPRESSOR) 25 MG tablet Take 75 mg by mouth 2 (two) times daily.    Multiple Vitamins-Minerals (THEREMS-M) TABS Take by mouth. 9 mg iron-400 mcg; gastric tube Once A Day    nystatin (MYCOSTATIN) 100000 UNIT/ML suspension Take 10 mLs by mouth 4 (four) times daily. Swish and swallow    ondansetron (ZOFRAN) 4 MG tablet Take 4 mg by mouth every 6 (six) hours as needed for nausea or vomiting.    sertraline (ZOLOFT) 50 MG tablet Take 50 mg by mouth daily.    sodium chloride 1 g tablet Take 1 g by mouth 2 (two) times daily.    sodium chloride flush (NS) 0.9 % SOLN Inject 5 mLs into the vein 2 (two) times daily.    ZINC OXIDE EX Apply topically. Apply to sacrum and perineal areas due to redness    [DISCONTINUED] Multiple Vitamins-Iron (MULTIVITAMINS WITH IRON) TABS tablet Take 1 tablet by mouth every morning. 10/10/2022: Flinstone vitamin    No facility-administered encounter medications on file as of 12/03/2022.     SIGNIFICANT DIAGNOSTIC EXAMS  PREVIOUS    11-14-22: renal/adrenal ultrasound:  1. Moderate left hydronephrosis, increased from previous ultrasound but similar to more recent CT. 2. Probable dependent bladder mass with internal blood flow suspicious for recurrent bladder malignancy, especially given the patient's history. The adrenal glands  was suboptimally evaluated on recent CT due to artifact from the right hip replacement. If not recently performed, recommend cystoscopy. 3. Right renal staghorn calculus, bilateral renal cysts and renal cortical thinning and increased echogenicity, as before, compatible with chronic medical renal disease.  NO NEW EXAMS     LABS REVIEWED: PREVIOUS   11-06-22; wbc 10.6; hgb 10.7; hct 33.7; mcv 86.4 plt 187; glucose 114; bun 62; creat 1.26; k+ 5.9; na++ 133; ca 8.5; gfr 39; mag 2.5; phos 5.1  11-10-22: glucose 104; bun 72; creat 1.19; k+ 5.8; na++ 132; ca 8.4; gfr 42; phos 4.8; albumin 3.3; mag 2.6 11-11-22: glucose 117; bun 71; creat 1.06 ;k+ 5.6; na++ 135; ca 8.1; gfr 48; phos 5.0; albumin 3.1; mag 2.5; cortisol 1.0    TODAY  11-24-22: glucose 106; bun 66; creat 0.95; k+ 4.3; na++ 138; ca 8.1 gfr 55; phos 3.4 albumin 2.7  11-27-22: wbc 5.0; hgb 7.6; hct 24.7; mcv 93.2 plt 98; glucose 111; bun 58; creat 0.92; k+ 4.2; na++ 137; ca 8.3; gfr 57; protein 5.2 albumin 2.5 pre-albumin 36; cortisol 16.2; hemoccult stool: neg   Review of Systems  Constitutional:  Negative for malaise/fatigue.  Respiratory:  Positive for cough. Negative for shortness of breath.   Cardiovascular:  Positive for leg swelling. Negative for chest pain and palpitations.  Gastrointestinal:  Negative for abdominal pain, constipation and heartburn.  Musculoskeletal:  Negative for back pain, joint pain and myalgias.  Skin: Negative.   Neurological:  Negative for dizziness.  Psychiatric/Behavioral:  The patient is not nervous/anxious.     Physical Exam Constitutional:      General: She is not in acute distress.  Appearance: She is well-developed. She is not diaphoretic.  Neck:     Thyroid: No thyromegaly.  Cardiovascular:     Rate and Rhythm: Normal rate and regular rhythm.     Pulses: Normal pulses.     Heart sounds: Normal heart sounds.  Pulmonary:     Effort: Pulmonary effort is normal. No respiratory distress.     Comments: Bilateral diminished breath sounds  Abdominal:     General: Bowel sounds are normal. There is no distension.     Palpations: Abdomen is soft.     Tenderness: There is no abdominal tenderness.     Comments: Peg tube   Musculoskeletal:        General: Normal range of motion.     Cervical back: Neck supple.     Right lower leg: No edema.     Left lower leg: No edema.  Lymphadenopathy:     Cervical: No cervical adenopathy.  Skin:    General: Skin is warm and dry.  Neurological:     Mental Status: She is alert. Mental status is at baseline.  Psychiatric:        Mood and Affect: Mood normal.       ASSESSMENT/ PLAN:  TODAY  Bilateral lower extremity edema Weight gain  Will begin demadex 10 mg daily and will monitor her status    Ok Edwards NP [facility]  call [phone]

## 2022-12-04 ENCOUNTER — Ambulatory Visit (INDEPENDENT_AMBULATORY_CARE_PROVIDER_SITE_OTHER): Payer: Medicare Other | Admitting: Gastroenterology

## 2022-12-04 ENCOUNTER — Encounter (INDEPENDENT_AMBULATORY_CARE_PROVIDER_SITE_OTHER): Payer: Self-pay | Admitting: Gastroenterology

## 2022-12-04 VITALS — BP 100/66 | HR 67 | Temp 97.1°F | Ht 61.0 in | Wt 116.0 lb

## 2022-12-04 DIAGNOSIS — R197 Diarrhea, unspecified: Secondary | ICD-10-CM

## 2022-12-04 DIAGNOSIS — D509 Iron deficiency anemia, unspecified: Secondary | ICD-10-CM

## 2022-12-04 MED ORDER — FERROUS SULFATE 325 (65 FE) MG PO TABS: 325.0000 mg | ORAL_TABLET | Freq: Two times a day (BID) | ORAL | 2 refills | Status: DC

## 2022-12-04 MED ORDER — PANTOPRAZOLE SODIUM 40 MG PO TBEC: 40.0000 mg | DELAYED_RELEASE_TABLET | Freq: Every day | ORAL | 1 refills | Status: DC

## 2022-12-04 NOTE — Patient Instructions (Signed)
We will start iron pills twice daily and protonix '40mg'$  daily Will check stool studies to rule out infectious diarrhea Repeat blood counts in 1 week As discussed, colonoscopy would likely be more risky than beneficial given her health issues, for now will treat her anemia and monitor for obvious GI bleeding.  Follow up 1 month

## 2022-12-04 NOTE — Progress Notes (Incomplete)
Referring Provider: Redmond School, MD Primary Care Physician:  Redmond School, MD Primary GI Physician:   Chief Complaint  Patient presents with   Anemia    Patient here today for a follow up on anemia.Patient Hgb on 11/28/2022 was 8.0 and Hct 26.3, had positive hemoccult stool cards on 11/30/2022. Patient denies shortness of breath,but does have fatigue.    HPI:   Jane Andrews is a 88 y.o. female with past medical history of  GERD, IDA, previus bowel resection, SIBO.    Patient presenting today for anemia.  Last seen in march 2023, at that time presenting for hospital follow up after admission in February with anemia and hgb of 9.6. at time of last visit, she was feeling somewhat weak, no rectal bleeding or melena, taking flintstone multiviamin with iron. Family reported some heavier nose bleeds prior to her hospitalization.  Recommended occult stool card x3, repeat CBC, Iron studies, start probiotic, continue flitnstone vitamin with iron.  1/3 occult stool cards was positive, hgb and iron studies WNL  She underwent EGD in April with findings as below, recommended to have no further workup unless she had gross GI bleeding/need for blood transfusion.  11/5 hgb dropped 8 Notably hgb dropped to 6.7 on 11/7 requiring blood transfusion Ferritin in November was 271, iron 7, TIBC 215, saturation 3 1 positive FOBT on 12/17 Baseline hgb is 9-10 range   Present:  Patient arrives with her niece, Jane Andrews, who helps provide history. Jane Andrews states that blood counts dropped to 6.7 in in November, patient received blood with improvement in counts and then dropped again to 7.6 on 12/14, heme positive stool last week. She notes that patient is at [facility] now after recent hospital admission in mid November for pneumonioa and respiratory failure, she had a feeding tube placed a while back due to dysphagia secondary to cervical stenosis and ACDF, and recently just had a PICC line placed to  start receiving TPN. She notes that patient is having a lot of diarrhea, up to 10-15 episodes per day at times, though some days may have little diarrhea. She is eating some foods, pureed and liquids for now. She had some antibiotics in mid November, and thinks that diarrhea began when she got her feeding tube in mid November.   She denies SOB, has occasional dizziness and fatigue. No obvious rectal bleeding or melena reported to family by nursing facility staff. Patient denies abdominal pain, nausea, or vomiting.   Last Colonoscopy:12/9/13Few diverticula at sigmoid colon and small external hemorrhoids otherwise normal colonoscopy to ileocolonic anastomosis located in the vicinity of hepatic flexure or ascending colon.  Last Endoscopy:April 2023 - Normal hypopharynx. - Normal esophagus. - Z-line irregular, 35 cm from the incisors. - 2 cm hiatal hernia. - Portal hypertensive gastropathy. - Gastric antral vascular ectasia without bleeding. - Normal duodenal bulb, second portion of the duodenum and third portion of the duodenum. - No specimens collected.  Recommendations:    Past Medical History:  Diagnosis Date   Abdominal adhesions 1994   Allergic rhinitis    Anemia    Anxiety and depression    Aortic stenosis    Arthritis    Atrial fibrillation ([place]) 10/2012   Associated with severe anemia and esophageal pill impaction   Breast carcinoma (HCC)    Right mastectomy   Cholelithiasis    Essential hypertension    Gastroesophageal reflux disease    Hiatal hernia   History of blood transfusion    Hypothyroidism  Low back pain    Malabsorption    Short gut syndrome following small bowel resection surgery x2   Nephrolithiasis 2004   Painless hematuria   Short gut syndrome    Bowel resection , 2004   Upper GI bleed 2004   Multiple episodes of melena-? due to gastritis or adverse drug effect (nonsteroidals, small bowel ulceration with Fosamax); caused by Pepto-Bismol during one  Emergency Department evaluation    Past Surgical History:  Procedure Laterality Date   ABDOMINAL HYSTERECTOMY  12/15/1958   massive gynecologic bleeding post delivery   ANTERIOR CERVICAL DECOMP/DISCECTOMY FUSION N/A 10/17/2022   Procedure: Anterior Cervical Discectomy and Fusion Cervical Three-Four/Cervical Four-Five;  Surgeon: Earnie Larsson, MD;  Location: [place];  Service: Neurosurgery;  Laterality: N/A;  3C   BOWEL RESECTION     Resulting short gut syndrome   CHOLECYSTECTOMY N/A 06/25/2018   Procedure: LAPAROSCOPIC CHOLECYSTECTOMY WITH INTRAOPERATIVE CHOLANGIOGRAM;  Surgeon: Armandina Gemma, MD;  [facility];  Service: General;  Laterality: N/A;   COLONOSCOPY W/ POLYPECTOMY  12/16/2003   Lipoma; diverticulosis   COLONOSCOPY WITH ESOPHAGOGASTRODUODENOSCOPY (EGD)  11/22/2012   Rehman   CYSTOSCOPY W/ RETROGRADES Bilateral 04/16/2022   Procedure: CYSTOSCOPY;  Surgeon: Primus Bravo., MD;  [facility];  Service: Urology;  Laterality: Bilateral;   ESOPHAGOGASTRODUODENOSCOPY (EGD) WITH PROPOFOL N/A 04/04/2022   Procedure: ESOPHAGOGASTRODUODENOSCOPY (EGD) WITH PROPOFOL;  Surgeon: Rogene Houston, MD;  [facility];  Service: Endoscopy;  Laterality: N/A;  210   HIP ARTHROPLASTY Right 06/15/2020   Procedure: ANTERIOR ARTHROPLASTY BIPOLAR HIP (HEMIARTHROPLASTY);  Surgeon: Mcarthur Rossetti, MD;  [facility]: [facility];  Service: Orthopedics;  Laterality: Right;   IR GASTROSTOMY TUBE MOD SED  10/29/2022   LAPAROSCOPIC LYSIS OF ADHESIONS  12/16/1963   s/p adhesions   MASTECTOMY Right    Carcinoma of the breast; right   TRANSURETHRAL RESECTION OF BLADDER TUMOR N/A 04/16/2022   Procedure: TRANSURETHRAL RESECTION OF BLADDER TUMOR (TURBT);  Surgeon: Primus Bravo., MD;  [facility];  Service: Urology;  Laterality: N/A;   UPPER GASTROINTESTINAL ENDOSCOPY      Current Outpatient Medications  Medication Sig Dispense Refill   acetaminophen (TYLENOL) 500 MG tablet  Take 500 mg by mouth every 8 (eight) hours.     ALPRAZolam (XANAX) 0.5 MG tablet Take 2 tablets (1 mg total) by mouth at bedtime. 60 tablet 0   amLODipine (NORVASC) 10 MG tablet Place 1 tablet (10 mg total) into feeding tube daily.     clotrimazole (LOTRIMIN) 1 % cream Apply 1 Application topically.  apply to sacral area and buttocks after each incontinent episode to be followed by zinc oxide with incontinent episode     diphenoxylate-atropine (LOMOTIL) 2.5-0.025 MG tablet Take 2 tablets by mouth every 8 (eight) hours.     famotidine (PEPCID) 20 MG tablet Place 1 tablet (20 mg total) into feeding tube daily.     fludrocortisone (FLORINEF) 0.'1mg'$ /mL SUSP Take 0.1 mg by mouth daily.     FOSFOMYCIN TROMETHAMINE PO Take 3 g by mouth. Once a day every 10 days     gabapentin (NEURONTIN) 250 MG/5ML solution Place 2 mLs (100 mg total) into feeding tube 2 (two) times daily at 10 am and 4 pm.  12   gabapentin (NEURONTIN) 250 MG/5ML solution Place 4 mLs (200 mg total) into feeding tube at bedtime.  12   Heparin Sod, Pork, Lock Flush (HEPARIN FLUSH) 10 UNIT/ML SOLN injection 3 mLs in the morning and at bedtime.  hydrocortisone (CORTEF) 5 MG tablet Take 5 mg by mouth daily. Give 7.5 mg in the AM and 5 mg in the PM     levothyroxine (SYNTHROID) 75 MCG tablet Take 1 tablet (75 mcg total) by mouth daily before breakfast. 30 tablet 0   loperamide (IMODIUM A-D) 2 MG tablet Take 2 mg by mouth every 4 (four) hours.     methocarbamol (ROBAXIN) 750 MG tablet Take 1 tablet (750 mg total) by mouth 4 (four) times daily. 120 tablet 0   metoprolol tartrate (LOPRESSOR) 25 MG tablet Take 75 mg by mouth 2 (two) times daily.     Multiple Vitamins-Minerals (THEREMS-M) TABS Take by mouth. 9 mg iron-400 mcg; gastric tube Once A Day     nystatin (MYCOSTATIN) 100000 UNIT/ML suspension Take 10 mLs by mouth 4 (four) times daily. Swish and swallow     ondansetron (ZOFRAN) 4 MG tablet Take 4 mg by mouth every 6 (six) hours as needed  for nausea or vomiting.     sertraline (ZOLOFT) 50 MG tablet Take 50 mg by mouth daily.     sodium chloride 1 g tablet Take 1 g by mouth 2 (two) times daily.     sodium chloride flush (NS) 0.9 % SOLN Inject 5 mLs into the vein 2 (two) times daily.     ZINC OXIDE EX Apply topically. Apply to sacrum and perineal areas due to redness     Amino Acid Infusion in D10W (CLINIMIX/DEXTROSE, 4.25/10, IV) Inject 100 mLs into the vein in the morning and at bedtime.     No current facility-administered medications for this visit.    Allergies as of 12/04/2022 - Review Complete 12/04/2022  Allergen Reaction Noted   Codeine Nausea And Vomiting 06/13/2012    Family History  Problem Relation Age of Onset   Anuerysm Father    Rheum arthritis Sister    Healthy Sister    COPD Sister    Healthy Brother    Cancer Other    Colon cancer Neg Hx     Social History   Socioeconomic History   Marital status: Widowed    Spouse name: Not on file   Number of children: Not on file   Years of education: 12   Highest education level: Not on file  Occupational History   Not on file  Tobacco Use   Smoking status: Former    Packs/day: 1.50    Years: 20.00    Total pack years: 30.00    Types: Cigarettes    Passive exposure: Never   Smokeless tobacco: Never  Vaping Use   Vaping Use: Never used  Substance and Sexual Activity   Alcohol use: No   Drug use: No   Sexual activity: Not Currently    Birth control/protection: Post-menopausal, Surgical    Comment: hyst  Other Topics Concern   Not on file  Social History Narrative   Not on file   Social Determinants of Health   Financial Resource Strain: Not on file  Food Insecurity: No Food Insecurity (10/20/2022)   Hunger Vital Sign    Worried About Running Out of Food in the Last Year: Never true    Ran Out of Food in the Last Year: Never true  Transportation Needs: No Transportation Needs (10/20/2022)   PRAPARE - Radiographer, therapeutic (Medical): No    Lack of Transportation (Non-Medical): No  Physical Activity: Not on file  Stress: Not on file  Social Connections: Not on  file    Review of systems General: negative for malaise, night sweats, fever, chills, weight loss Neck: Negative for lumps, goiter, pain and significant neck swelling Resp: Negative for cough, wheezing, dyspnea at rest CV: Negative for chest pain, leg swelling, palpitations, orthopnea GI: denies melena, hematochezia, nausea, vomiting, constipation, dysphagia, odyonophagia, early satiety or unintentional weight loss. +diarrhea MSK: Negative for joint pain or swelling, back pain, and muscle pain. Derm: Negative for itching or rash Psych: Denies depression, anxiety, memory loss, confusion. No homicidal or suicidal ideation.  Heme: Negative for prolonged bleeding, bruising easily, and swollen nodes. Endocrine: Negative for cold or heat intolerance, polyuria, polydipsia and goiter. Neuro: negative for tremor, gait imbalance, syncope and seizures. The remainder of the review of systems is noncontributory.  Physical Exam: BP 100/66 (BP Location: Left Arm, Patient Position: Sitting, Cuff Size: Large)   Pulse 67   Temp (!) 97.1 F (36.2 C) (Temporal)   Ht '5\' 1"'$  (1.549 m)   Wt 116 lb (52.6 kg)   BMI 21.92 kg/m  General:   Alert and oriented. No distress noted. Pleasant and cooperative. Seated in wheelchair Head:  Normocephalic and atraumatic. Eyes:  Conjuctiva clear without scleral icterus. Mouth:  Oral mucosa pink and moist. Good dentition. No lesions. Heart: Normal rate and rhythm, s1 and s2 heart sounds present.  Lungs: Clear lung sounds in all lobes. Respirations equal and unlabored. Abdomen:  +BS, soft, non-tender and non-distended. No rebound or guarding. No HSM or masses noted. Derm: No palmar erythema or jaundice Msk:  Symmetrical without gross deformities. Normal posture. Extremities:  Without edema. Neurologic:  Alert and   oriented x4 Psych:  Alert and cooperative. Normal mood and affect.  Invalid input(s): "6 MONTHS"   ASSESSMENT: Jane Andrews is a 88 y.o. female presenting today for anemia.  Anemia/heme positive Stool: long history of IDA with last EGD in April 2023 with portal hypertensive gastropathy and GAVE, recommended against further endoscopic evaluation at that time unless patient presented with overt bleeding. Notably, she is not on a PPI or iron pill. Given patient's frailty and multi-morbidity, risks of colonoscopy at this point would likely outweigh the benefits, which I discussed with the patient and her niece Jane Andrews. Patient's niece does not want patient to undergo colonoscopy given her current health state. At this time, given no overt GI bleeding, would recommend conservative management of anemia with starting PPI once daily and Iron pills BID. Will recheck h&h in 1 week. Recheck iron stores in 1 month. Will need to proceed to the ED if she presents with any overt bleeding or becomes symptomatic of her anemia as this may indicate further drop in hgb requiring transfusion.   Diarrhea: diarrhea up to 10-15 stools per day. Did have some antibiotic therapy in November, notably had a feeding tube placed around that time as well, recently started on TPN. She denies abdominal pain, nausea or vomiting. Diarrhea could be secondary to current nutrition status, however, will check stool studies to rule out underlying infection. Last TSH 1 month ago was 1.506, electrolytes WNL on 12/15 other than slightly low calcium at 8.4.    PLAN:  Start Protonix '40mg'$  daily  2. Stool studies for diarrhea  3. Start Iron pill BID 4. Repeat h&h 1 week 5. Iron studies 1 month 6. ED precautions for GI bleeding/symptomatic anemia   All questions were answered, patient verbalized understanding and is in agreement with plan as outlined above.    Follow Up: 1 month  Kamill Fulbright L.  Alver Sorrow, MSN, APRN,  AGNP-C Adult-Gerontology Nurse Practitioner [facility]  I have reviewed the note and agree with the APP's assessment as described in this progress note  Maylon Peppers, MD Gastroenterology and Hepatology [facility]

## 2022-12-05 ENCOUNTER — Encounter: Payer: Self-pay | Admitting: Adult Health

## 2022-12-05 DIAGNOSIS — J9611 Chronic respiratory failure with hypoxia: Secondary | ICD-10-CM

## 2022-12-05 DIAGNOSIS — R600 Localized edema: Secondary | ICD-10-CM

## 2022-12-05 NOTE — Progress Notes (Unsigned)
Location:  [place] Room Number: 413K Place of Service:  SNF (31)   CODE STATUS: DNR  Allergies  Allergen Reactions  . Codeine Nausea And Vomiting    Chief Complaint  Patient presents with  . Acute Visit    Weight gain    HPI:  She is gaining weight from 12-02-22: 116.6 pounds to 12-05-22: 122.3 pounds. She denies any shortness of breath. She does have a cough present. She does have trace lower extremity edema. She is on TPN to supplement her caloric needs; and to rest her gut from diarrhea. She is sitting in her wheelchair eating lunch puree diet.   Past Medical History:  Diagnosis Date  . Abdominal adhesions 1994  . Allergic rhinitis   . Anemia   . Anxiety and depression   . Aortic stenosis   . Arthritis   . Atrial fibrillation ([place]) 10/2012   Associated with severe anemia and esophageal pill impaction  . Breast carcinoma (HCC)    Right mastectomy  . Cholelithiasis   . Essential hypertension   . Gastroesophageal reflux disease    Hiatal hernia  . History of blood transfusion   . Hypothyroidism   . Low back pain   . 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 Nephrolithiasis 2004   Painless hematuria  . Short gut syndrome    Bowel resection , 2004  . Upper GI bleed 2004   Multiple episodes of melena-? due to gastritis or adverse drug effect (nonsteroidals, small bowel ulceration with Fosamax); caused by Pepto-Bismol during one Emergency Department evaluation    Past Surgical History:  Procedure Laterality Date  . ABDOMINAL HYSTERECTOMY  12/15/1958   massive gynecologic bleeding post delivery  . ANTERIOR CERVICAL DECOMP/DISCECTOMY FUSION N/A 10/17/2022   Procedure: Anterior Cervical Discectomy and Fusion Cervical Three-Four/Cervical Four-Five;  Surgeon: Earnie Larsson, MD;  Location: [place];  Service: Neurosurgery;  Laterality: N/A;  3C  . BOWEL RESECTION     Resulting short gut syndrome  .  CHOLECYSTECTOMY N/A 06/25/2018   Procedure: LAPAROSCOPIC CHOLECYSTECTOMY WITH INTRAOPERATIVE CHOLANGIOGRAM;  Surgeon: Armandina Gemma, MD;  [facility];  Service: General;  Laterality: N/A;  . COLONOSCOPY W/ POLYPECTOMY  12/16/2003   Lipoma; diverticulosis  . COLONOSCOPY WITH ESOPHAGOGASTRODUODENOSCOPY (EGD)  11/22/2012   Rehman  . CYSTOSCOPY W/ RETROGRADES Bilateral 04/16/2022   Procedure: CYSTOSCOPY;  Surgeon: Primus Bravo., MD;  [facility];  Service: Urology;  Laterality: Bilateral;  . ESOPHAGOGASTRODUODENOSCOPY (EGD) WITH PROPOFOL N/A 04/04/2022   Procedure: ESOPHAGOGASTRODUODENOSCOPY (EGD) WITH PROPOFOL;  Surgeon: Rogene Houston, MD;  [facility];  Service: Endoscopy;  Laterality: N/A;  210  . HIP ARTHROPLASTY Right 06/15/2020   Procedure: ANTERIOR ARTHROPLASTY BIPOLAR HIP (HEMIARTHROPLASTY);  Surgeon: Mcarthur Rossetti, MD;  Location[facility];  Service: Orthopedics;  Laterality: Right;  . IR GASTROSTOMY TUBE MOD SED  10/29/2022  . LAPAROSCOPIC LYSIS OF ADHESIONS  12/16/1963   s/p adhesions  . MASTECTOMY Right    Carcinoma of the breast; right  . TRANSURETHRAL RESECTION OF BLADDER TUMOR N/A 04/16/2022   Procedure: TRANSURETHRAL RESECTION OF BLADDER TUMOR (TURBT);  Surgeon: Primus Bravo., MD;  [facility];  Service: Urology;  Laterality: N/A;  . UPPER GASTROINTESTINAL ENDOSCOPY      Social History   Socioeconomic History  . Marital status: Widowed    Spouse name: Not on file  . Number of children: Not on file  . Years of  education: 61  . Highest education level: Not on file  Occupational History  . Not on file  Tobacco Use  . Smoking status: Former    Packs/day: 1.50    Years: 20.00    Total pack years: 30.00    Types: Cigarettes    Passive exposure: Never  . Smokeless tobacco: Never  Vaping Use  . Vaping Use: Never used  Substance and Sexual Activity  . Alcohol use: No  . Drug use: No  . Sexual activity: Not Currently     Birth control/protection: Post-menopausal, Surgical    Comment: hyst  Other Topics Concern  . Not on file  Social History Narrative  . Not on file   Social Determinants of Health   Financial Resource Strain: Not on file  Food Insecurity: No Food Insecurity (10/20/2022)   Hunger Vital Sign   . Worried About Charity fundraiser in the Last Year: Never true   . Ran Out of Food in the Last Year: Never true  Transportation Needs: No Transportation Needs (10/20/2022)   PRAPARE - Transportation   . Lack of Transportation (Medical): No   . Lack of Transportation (Non-Medical): No  Physical Activity: Not on file  Stress: Not on file  Social Connections: Not on file  Intimate Partner Violence: Not At Risk (10/20/2022)   Humiliation, Afraid, Rape, and Kick questionnaire   . Fear of Current or Ex-Partner: No   . Emotionally Abused: No   . Physically Abused: No   . Sexually Abused: No   Family History  Problem Relation Age of Onset  . Anuerysm Father   . Rheum arthritis Sister   . Healthy Sister   . COPD Sister   . Healthy Brother   . Cancer Other   . Colon cancer Neg Hx       VITAL SIGNS BP (!) 96/58   Pulse 76   Temp 98 F (36.7 C) (Temporal)   Resp 20   Ht '5\' 1"'$  (1.549 m)   Wt 122 lb 4.8 oz (55.5 kg)   SpO2 96%   BMI 23.11 kg/m   Outpatient Encounter Medications as of 12/05/2022  Medication Sig  . acetaminophen (TYLENOL) 500 MG tablet Take 500 mg by mouth every 8 (eight) hours.  . ALPRAZolam (XANAX) 0.5 MG tablet Take 2 tablets (1 mg total) by mouth at bedtime.  . Amino Acid Infusion in D10W (CLINIMIX/DEXTROSE, 4.25/10, IV) Inject 100 mLs into the vein in the morning and at bedtime.  Marland Kitchen amLODipine (NORVASC) 10 MG tablet Place 1 tablet (10 mg total) into feeding tube daily.  . clotrimazole (LOTRIMIN) 1 % cream Apply 1 Application topically.  apply to sacral area and buttocks after each incontinent episode to be followed by zinc oxide with incontinent episode  .  diphenoxylate-atropine (LOMOTIL) 2.5-0.025 MG tablet Take 2 tablets by mouth every 8 (eight) hours.  . famotidine (PEPCID) 20 MG tablet Place 1 tablet (20 mg total) into feeding tube daily.  . ferrous sulfate 325 (65 FE) MG tablet Take 1 tablet (325 mg total) by mouth 2 (two) times daily with a meal.  . fludrocortisone (FLORINEF) 0.'1mg'$ /mL SUSP Take 0.1 mg by mouth daily.  . FOSFOMYCIN TROMETHAMINE PO Take 3 g by mouth. Once a day every 10 days  . gabapentin (NEURONTIN) 250 MG/5ML solution Place 2 mLs (100 mg total) into feeding tube 2 (two) times daily at 10 am and 4 pm.  . gabapentin (NEURONTIN) 250 MG/5ML solution Place 4 mLs (200 mg total)  into feeding tube at bedtime.  . Heparin Sod, Pork, Lock Flush (HEPARIN FLUSH) 10 UNIT/ML SOLN injection 3 mLs in the morning and at bedtime.  . hydrocortisone (CORTEF) 5 MG tablet Take 5 mg by mouth daily. Give 7.5 mg in the AM and 5 mg in the PM  . levothyroxine (SYNTHROID) 75 MCG tablet Take 1 tablet (75 mcg total) by mouth daily before breakfast.  . loperamide (IMODIUM A-D) 2 MG tablet Take 2 mg by mouth every 4 (four) hours.  . methocarbamol (ROBAXIN) 750 MG tablet Take 1 tablet (750 mg total) by mouth 4 (four) times daily.  . metoprolol tartrate (LOPRESSOR) 25 MG tablet Take 75 mg by mouth 2 (two) times daily.  . Multiple Vitamins-Minerals (THEREMS-M) TABS Take by mouth. 9 mg iron-400 mcg; gastric tube Once A Day  . nystatin (MYCOSTATIN) 100000 UNIT/ML suspension Take 10 mLs by mouth 4 (four) times daily. Swish and swallow  . ondansetron (ZOFRAN) 4 MG tablet Take 4 mg by mouth every 6 (six) hours as needed for nausea or vomiting.  . pantoprazole (PROTONIX) 40 MG tablet Take 1 tablet (40 mg total) by mouth daily before breakfast.  . sertraline (ZOLOFT) 50 MG tablet Take 50 mg by mouth daily.  . sodium chloride 1 g tablet Take 1 g by mouth 2 (two) times daily.  . sodium chloride flush (NS) 0.9 % SOLN Inject 5 mLs into the vein 2 (two) times daily.  Marland Kitchen  ZINC OXIDE EX Apply topically. Apply to sacrum and perineal areas due to redness   No facility-administered encounter medications on file as of 12/05/2022.     SIGNIFICANT DIAGNOSTIC EXAMS       ASSESSMENT/ PLAN:     Ok Edwards NP [facility] Adult Medicine  call [phone]   This encounter was created in error - please disregard.

## 2022-12-06 ENCOUNTER — Other Ambulatory Visit (HOSPITAL_COMMUNITY)
Admission: RE | Admit: 2022-12-06 | Discharge: 2022-12-06 | Disposition: A | Discharge status: Home / Self Care | Payer: Medicare Other | Source: Skilled Nursing Facility | Attending: Internal Medicine | Admitting: Internal Medicine

## 2022-12-06 DIAGNOSIS — Z452 Encounter for adjustment and management of vascular access device: Secondary | ICD-10-CM | POA: Diagnosis not present

## 2022-12-06 DIAGNOSIS — L89152 Pressure ulcer of sacral region, stage 2: Secondary | ICD-10-CM | POA: Diagnosis present

## 2022-12-06 DIAGNOSIS — E8809 Other disorders of plasma-protein metabolism, not elsewhere classified: Secondary | ICD-10-CM | POA: Diagnosis present

## 2022-12-06 DIAGNOSIS — E875 Hyperkalemia: Secondary | ICD-10-CM | POA: Diagnosis present

## 2022-12-06 DIAGNOSIS — E869 Volume depletion, unspecified: Secondary | ICD-10-CM | POA: Diagnosis present

## 2022-12-06 DIAGNOSIS — E039 Hypothyroidism, unspecified: Secondary | ICD-10-CM | POA: Diagnosis present

## 2022-12-06 DIAGNOSIS — I959 Hypotension, unspecified: Secondary | ICD-10-CM | POA: Diagnosis present

## 2022-12-06 DIAGNOSIS — Z66 Do not resuscitate: Secondary | ICD-10-CM | POA: Diagnosis present

## 2022-12-06 DIAGNOSIS — M6281 Muscle weakness (generalized): Secondary | ICD-10-CM | POA: Diagnosis not present

## 2022-12-06 DIAGNOSIS — K922 Gastrointestinal hemorrhage, unspecified: Secondary | ICD-10-CM | POA: Diagnosis present

## 2022-12-06 DIAGNOSIS — R531 Weakness: Secondary | ICD-10-CM | POA: Diagnosis not present

## 2022-12-06 DIAGNOSIS — N1832 Chronic kidney disease, stage 3b: Secondary | ICD-10-CM | POA: Diagnosis present

## 2022-12-06 DIAGNOSIS — N179 Acute kidney failure, unspecified: Secondary | ICD-10-CM | POA: Diagnosis present

## 2022-12-06 DIAGNOSIS — Z7189 Other specified counseling: Secondary | ICD-10-CM | POA: Diagnosis not present

## 2022-12-06 DIAGNOSIS — I480 Paroxysmal atrial fibrillation: Secondary | ICD-10-CM | POA: Diagnosis not present

## 2022-12-06 DIAGNOSIS — I4891 Unspecified atrial fibrillation: Secondary | ICD-10-CM | POA: Diagnosis present

## 2022-12-06 DIAGNOSIS — E872 Acidosis, unspecified: Secondary | ICD-10-CM | POA: Diagnosis present

## 2022-12-06 DIAGNOSIS — E271 Primary adrenocortical insufficiency: Secondary | ICD-10-CM | POA: Diagnosis present

## 2022-12-06 DIAGNOSIS — M25522 Pain in left elbow: Secondary | ICD-10-CM | POA: Diagnosis not present

## 2022-12-06 DIAGNOSIS — M25512 Pain in left shoulder: Secondary | ICD-10-CM | POA: Diagnosis not present

## 2022-12-06 DIAGNOSIS — I129 Hypertensive chronic kidney disease with stage 1 through stage 4 chronic kidney disease, or unspecified chronic kidney disease: Secondary | ICD-10-CM | POA: Diagnosis present

## 2022-12-06 DIAGNOSIS — F32A Depression, unspecified: Secondary | ICD-10-CM | POA: Diagnosis present

## 2022-12-06 DIAGNOSIS — F419 Anxiety disorder, unspecified: Secondary | ICD-10-CM | POA: Diagnosis present

## 2022-12-06 DIAGNOSIS — C670 Malignant neoplasm of trigone of bladder: Secondary | ICD-10-CM | POA: Diagnosis not present

## 2022-12-06 DIAGNOSIS — K909 Intestinal malabsorption, unspecified: Secondary | ICD-10-CM | POA: Diagnosis present

## 2022-12-06 DIAGNOSIS — R52 Pain, unspecified: Secondary | ICD-10-CM | POA: Diagnosis not present

## 2022-12-06 DIAGNOSIS — C679 Malignant neoplasm of bladder, unspecified: Secondary | ICD-10-CM | POA: Diagnosis not present

## 2022-12-06 DIAGNOSIS — E43 Unspecified severe protein-calorie malnutrition: Secondary | ICD-10-CM | POA: Diagnosis present

## 2022-12-06 DIAGNOSIS — Z515 Encounter for palliative care: Secondary | ICD-10-CM | POA: Diagnosis not present

## 2022-12-06 DIAGNOSIS — D649 Anemia, unspecified: Secondary | ICD-10-CM | POA: Diagnosis present

## 2022-12-06 DIAGNOSIS — I350 Nonrheumatic aortic (valve) stenosis: Secondary | ICD-10-CM | POA: Diagnosis present

## 2022-12-06 DIAGNOSIS — Z853 Personal history of malignant neoplasm of breast: Secondary | ICD-10-CM | POA: Diagnosis not present

## 2022-12-06 DIAGNOSIS — K219 Gastro-esophageal reflux disease without esophagitis: Secondary | ICD-10-CM | POA: Diagnosis present

## 2022-12-06 DIAGNOSIS — K90829 Short bowel syndrome, unspecified: Secondary | ICD-10-CM | POA: Diagnosis present

## 2022-12-06 DIAGNOSIS — Z6823 Body mass index (BMI) 23.0-23.9, adult: Secondary | ICD-10-CM | POA: Diagnosis not present

## 2022-12-06 LAB — OCCULT BLOOD X 1 CARD TO LAB, STOOL: Fecal Occult Bld: POSITIVE — AB

## 2022-12-08 ENCOUNTER — Encounter (HOSPITAL_COMMUNITY)
Admission: RE | Admit: 2022-12-08 | Discharge: 2022-12-08 | Disposition: A | Discharge status: Home / Self Care | Payer: Medicare Other | Source: Skilled Nursing Facility | Attending: Adult Health | Admitting: Adult Health

## 2022-12-08 ENCOUNTER — Inpatient Hospital Stay (HOSPITAL_COMMUNITY): Payer: Medicare Other

## 2022-12-08 ENCOUNTER — Inpatient Hospital Stay (HOSPITAL_COMMUNITY)
Admission: EM | Admit: 2022-12-08 | Discharge: 2022-12-09 | DRG: 377 | Disposition: A | Discharge status: Hospice | Payer: Medicare Other | Source: Skilled Nursing Facility | Attending: Internal Medicine | Admitting: Internal Medicine

## 2022-12-08 ENCOUNTER — Encounter (HOSPITAL_COMMUNITY): Payer: Self-pay

## 2022-12-08 ENCOUNTER — Other Ambulatory Visit: Payer: Self-pay

## 2022-12-08 DIAGNOSIS — Z853 Personal history of malignant neoplasm of breast: Secondary | ICD-10-CM

## 2022-12-08 DIAGNOSIS — I4891 Unspecified atrial fibrillation: Secondary | ICD-10-CM | POA: Diagnosis present

## 2022-12-08 DIAGNOSIS — E872 Acidosis, unspecified: Secondary | ICD-10-CM

## 2022-12-08 DIAGNOSIS — E271 Primary adrenocortical insufficiency: Secondary | ICD-10-CM | POA: Diagnosis present

## 2022-12-08 DIAGNOSIS — R52 Pain, unspecified: Secondary | ICD-10-CM | POA: Diagnosis not present

## 2022-12-08 DIAGNOSIS — Z66 Do not resuscitate: Secondary | ICD-10-CM | POA: Diagnosis present

## 2022-12-08 DIAGNOSIS — N1832 Chronic kidney disease, stage 3b: Secondary | ICD-10-CM | POA: Diagnosis present

## 2022-12-08 DIAGNOSIS — L89152 Pressure ulcer of sacral region, stage 2: Secondary | ICD-10-CM | POA: Diagnosis present

## 2022-12-08 DIAGNOSIS — I129 Hypertensive chronic kidney disease with stage 1 through stage 4 chronic kidney disease, or unspecified chronic kidney disease: Secondary | ICD-10-CM | POA: Diagnosis present

## 2022-12-08 DIAGNOSIS — K909 Intestinal malabsorption, unspecified: Secondary | ICD-10-CM | POA: Diagnosis present

## 2022-12-08 DIAGNOSIS — M25522 Pain in left elbow: Secondary | ICD-10-CM | POA: Diagnosis not present

## 2022-12-08 DIAGNOSIS — N179 Acute kidney failure, unspecified: Secondary | ICD-10-CM | POA: Diagnosis present

## 2022-12-08 DIAGNOSIS — Z8551 Personal history of malignant neoplasm of bladder: Secondary | ICD-10-CM

## 2022-12-08 DIAGNOSIS — F419 Anxiety disorder, unspecified: Secondary | ICD-10-CM | POA: Diagnosis present

## 2022-12-08 DIAGNOSIS — Z8261 Family history of arthritis: Secondary | ICD-10-CM

## 2022-12-08 DIAGNOSIS — E039 Hypothyroidism, unspecified: Secondary | ICD-10-CM | POA: Diagnosis present

## 2022-12-08 DIAGNOSIS — M6281 Muscle weakness (generalized): Secondary | ICD-10-CM | POA: Diagnosis not present

## 2022-12-08 DIAGNOSIS — I350 Nonrheumatic aortic (valve) stenosis: Secondary | ICD-10-CM | POA: Diagnosis present

## 2022-12-08 DIAGNOSIS — R54 Age-related physical debility: Secondary | ICD-10-CM | POA: Diagnosis present

## 2022-12-08 DIAGNOSIS — Z90710 Acquired absence of both cervix and uterus: Secondary | ICD-10-CM

## 2022-12-08 DIAGNOSIS — I480 Paroxysmal atrial fibrillation: Secondary | ICD-10-CM | POA: Diagnosis not present

## 2022-12-08 DIAGNOSIS — F32A Depression, unspecified: Secondary | ICD-10-CM | POA: Diagnosis present

## 2022-12-08 DIAGNOSIS — K90829 Short bowel syndrome, unspecified: Secondary | ICD-10-CM | POA: Diagnosis present

## 2022-12-08 DIAGNOSIS — E43 Unspecified severe protein-calorie malnutrition: Secondary | ICD-10-CM | POA: Diagnosis present

## 2022-12-08 DIAGNOSIS — Z87442 Personal history of urinary calculi: Secondary | ICD-10-CM

## 2022-12-08 DIAGNOSIS — E869 Volume depletion, unspecified: Secondary | ICD-10-CM | POA: Diagnosis present

## 2022-12-08 DIAGNOSIS — D649 Anemia, unspecified: Secondary | ICD-10-CM | POA: Diagnosis present

## 2022-12-08 DIAGNOSIS — Z515 Encounter for palliative care: Secondary | ICD-10-CM | POA: Diagnosis not present

## 2022-12-08 DIAGNOSIS — E8809 Other disorders of plasma-protein metabolism, not elsewhere classified: Secondary | ICD-10-CM | POA: Diagnosis present

## 2022-12-08 DIAGNOSIS — I10 Essential (primary) hypertension: Secondary | ICD-10-CM | POA: Diagnosis present

## 2022-12-08 DIAGNOSIS — I959 Hypotension, unspecified: Secondary | ICD-10-CM | POA: Diagnosis present

## 2022-12-08 DIAGNOSIS — E875 Hyperkalemia: Secondary | ICD-10-CM | POA: Diagnosis present

## 2022-12-08 DIAGNOSIS — K922 Gastrointestinal hemorrhage, unspecified: Principal | ICD-10-CM | POA: Diagnosis present

## 2022-12-08 DIAGNOSIS — K529 Noninfective gastroenteritis and colitis, unspecified: Secondary | ICD-10-CM | POA: Diagnosis present

## 2022-12-08 DIAGNOSIS — M25512 Pain in left shoulder: Secondary | ICD-10-CM | POA: Diagnosis not present

## 2022-12-08 DIAGNOSIS — Z885 Allergy status to narcotic agent status: Secondary | ICD-10-CM

## 2022-12-08 DIAGNOSIS — R531 Weakness: Secondary | ICD-10-CM | POA: Diagnosis not present

## 2022-12-08 DIAGNOSIS — Z452 Encounter for adjustment and management of vascular access device: Secondary | ICD-10-CM | POA: Diagnosis not present

## 2022-12-08 DIAGNOSIS — Z825 Family history of asthma and other chronic lower respiratory diseases: Secondary | ICD-10-CM

## 2022-12-08 DIAGNOSIS — K219 Gastro-esophageal reflux disease without esophagitis: Secondary | ICD-10-CM | POA: Diagnosis present

## 2022-12-08 DIAGNOSIS — Z79899 Other long term (current) drug therapy: Secondary | ICD-10-CM

## 2022-12-08 DIAGNOSIS — C670 Malignant neoplasm of trigone of bladder: Secondary | ICD-10-CM | POA: Diagnosis present

## 2022-12-08 DIAGNOSIS — Z7189 Other specified counseling: Secondary | ICD-10-CM

## 2022-12-08 DIAGNOSIS — Z6823 Body mass index (BMI) 23.0-23.9, adult: Secondary | ICD-10-CM | POA: Diagnosis not present

## 2022-12-08 DIAGNOSIS — Z9011 Acquired absence of right breast and nipple: Secondary | ICD-10-CM

## 2022-12-08 DIAGNOSIS — C679 Malignant neoplasm of bladder, unspecified: Secondary | ICD-10-CM | POA: Diagnosis not present

## 2022-12-08 LAB — BASIC METABOLIC PANEL
Anion gap: 9 (ref 5–15)
BUN: 87 mg/dL — ABNORMAL HIGH (ref 8–23)
CO2: 15 mmol/L — ABNORMAL LOW (ref 22–32)
Calcium: 8.5 mg/dL — ABNORMAL LOW (ref 8.9–10.3)
Chloride: 113 mmol/L — ABNORMAL HIGH (ref 98–111)
Creatinine, Ser: 1.56 mg/dL — ABNORMAL HIGH (ref 0.44–1.00)
GFR, Estimated: 30 mL/min — ABNORMAL LOW (ref >60)
Glucose, Bld: 170 mg/dL — ABNORMAL HIGH (ref 70–99)
Potassium: 5.7 mmol/L — ABNORMAL HIGH (ref 3.5–5.1)
Sodium: 137 mmol/L (ref 135–145)

## 2022-12-08 LAB — COMPREHENSIVE METABOLIC PANEL
ALT: 12 U/L (ref 0–44)
ALT: 13 U/L (ref 0–44)
AST: 12 U/L — ABNORMAL LOW (ref 15–41)
AST: 11 U/L — ABNORMAL LOW (ref 15–41)
Albumin: 2.3 g/dL — ABNORMAL LOW (ref 3.5–5.0)
Albumin: 2.5 g/dL — ABNORMAL LOW (ref 3.5–5.0)
Alkaline Phosphatase: 65 U/L (ref 38–126)
Alkaline Phosphatase: 67 U/L (ref 38–126)
Anion gap: 8 (ref 5–15)
Anion gap: 7 (ref 5–15)
BUN: 91 mg/dL — ABNORMAL HIGH (ref 8–23)
BUN: 93 mg/dL — ABNORMAL HIGH (ref 8–23)
CO2: 16 mmol/L — ABNORMAL LOW (ref 22–32)
CO2: 15 mmol/L — ABNORMAL LOW (ref 22–32)
Calcium: 8.1 mg/dL — ABNORMAL LOW (ref 8.9–10.3)
Calcium: 8.2 mg/dL — ABNORMAL LOW (ref 8.9–10.3)
Chloride: 113 mmol/L — ABNORMAL HIGH (ref 98–111)
Chloride: 113 mmol/L — ABNORMAL HIGH (ref 98–111)
Creatinine, Ser: 1.75 mg/dL — ABNORMAL HIGH (ref 0.44–1.00)
Creatinine, Ser: 1.71 mg/dL — ABNORMAL HIGH (ref 0.44–1.00)
GFR, Estimated: 27 mL/min — ABNORMAL LOW (ref >60)
GFR, Estimated: 27 mL/min — ABNORMAL LOW (ref >60)
Glucose, Bld: 89 mg/dL (ref 70–99)
Glucose, Bld: 133 mg/dL — ABNORMAL HIGH (ref 70–99)
Potassium: 5.6 mmol/L — ABNORMAL HIGH (ref 3.5–5.1)
Potassium: 5.7 mmol/L — ABNORMAL HIGH (ref 3.5–5.1)
Sodium: 137 mmol/L (ref 135–145)
Sodium: 135 mmol/L (ref 135–145)
Total Bilirubin: 0.9 mg/dL (ref 0.3–1.2)
Total Bilirubin: 0.7 mg/dL (ref 0.3–1.2)
Total Protein: 5.3 g/dL — ABNORMAL LOW (ref 6.5–8.1)
Total Protein: 5.7 g/dL — ABNORMAL LOW (ref 6.5–8.1)

## 2022-12-08 LAB — PROTIME-INR
INR: 1.1 (ref 0.8–1.2)
Prothrombin Time: 14.1 seconds (ref 11.4–15.2)

## 2022-12-08 LAB — CBC WITH DIFFERENTIAL/PLATELET
Abs Immature Granulocytes: 0.5 10*3/uL — ABNORMAL HIGH (ref 0.00–0.07)
Abs Immature Granulocytes: 0.46 10*3/uL — ABNORMAL HIGH (ref 0.00–0.07)
Basophils Absolute: 0 10*3/uL (ref 0.0–0.1)
Basophils Absolute: 0 10*3/uL (ref 0.0–0.1)
Basophils Relative: 0 %
Basophils Relative: 0 %
Eosinophils Absolute: 0 10*3/uL (ref 0.0–0.5)
Eosinophils Absolute: 0.2 10*3/uL (ref 0.0–0.5)
Eosinophils Relative: 0 %
Eosinophils Relative: 2 %
HCT: 22 % — ABNORMAL LOW (ref 36.0–46.0)
HCT: 22.1 % — ABNORMAL LOW (ref 36.0–46.0)
Hemoglobin: 6.8 g/dL — CL (ref 12.0–15.0)
Hemoglobin: 7 g/dL — ABNORMAL LOW (ref 12.0–15.0)
Immature Granulocytes: 6 %
Lymphocytes Relative: 13 %
Lymphocytes Relative: 11 %
Lymphs Abs: 1 10*3/uL (ref 0.7–4.0)
Lymphs Abs: 0.9 10*3/uL (ref 0.7–4.0)
MCH: 28.7 pg (ref 26.0–34.0)
MCH: 29.2 pg (ref 26.0–34.0)
MCHC: 30.9 g/dL (ref 30.0–36.0)
MCHC: 31.7 g/dL (ref 30.0–36.0)
MCV: 92.8 fL (ref 80.0–100.0)
MCV: 92.1 fL (ref 80.0–100.0)
Metamyelocytes Relative: 4 %
Monocytes Absolute: 0.9 10*3/uL (ref 0.1–1.0)
Monocytes Absolute: 0.8 10*3/uL (ref 0.1–1.0)
Monocytes Relative: 12 %
Monocytes Relative: 10 %
Myelocytes: 3 %
Neutro Abs: 5.2 10*3/uL (ref 1.7–7.7)
Neutro Abs: 5.5 10*3/uL (ref 1.7–7.7)
Neutrophils Relative %: 68 %
Neutrophils Relative %: 71 %
Platelets: 167 10*3/uL (ref 150–400)
Platelets: 170 10*3/uL (ref 150–400)
RBC: 2.37 MIL/uL — ABNORMAL LOW (ref 3.87–5.11)
RBC: 2.4 MIL/uL — ABNORMAL LOW (ref 3.87–5.11)
RDW: 22.5 % — ABNORMAL HIGH (ref 11.5–15.5)
RDW: 22 % — ABNORMAL HIGH (ref 11.5–15.5)
WBC: 7.6 10*3/uL (ref 4.0–10.5)
WBC: 7.9 10*3/uL (ref 4.0–10.5)
nRBC: 0 % (ref 0.0–0.2)
nRBC: 0 % (ref 0.0–0.2)

## 2022-12-08 LAB — IRON AND TIBC
Iron: 20 ug/dL — ABNORMAL LOW (ref 28–170)
Saturation Ratios: 9 % — ABNORMAL LOW (ref 10.4–31.8)
TIBC: 222 ug/dL — ABNORMAL LOW (ref 250–450)
UIBC: 202 ug/dL

## 2022-12-08 LAB — TSH: TSH: 3.377 u[IU]/mL (ref 0.350–4.500)

## 2022-12-08 LAB — TRANSFERRIN: Transferrin: 158 mg/dL — ABNORMAL LOW (ref 192–382)

## 2022-12-08 LAB — HEMOGLOBIN AND HEMATOCRIT, BLOOD
HCT: 24.7 % — ABNORMAL LOW (ref 36.0–46.0)
Hemoglobin: 7.8 g/dL — ABNORMAL LOW (ref 12.0–15.0)

## 2022-12-08 LAB — TRIGLYCERIDES: Triglycerides: 26 mg/dL (ref <150)

## 2022-12-08 LAB — C DIFFICILE QUICK SCREEN W PCR REFLEX
C Diff antigen: NEGATIVE
C Diff interpretation: NOT DETECTED
C Diff toxin: NEGATIVE

## 2022-12-08 LAB — PREALBUMIN: Prealbumin: 27 mg/dL (ref 18–38)

## 2022-12-08 LAB — PHOSPHORUS: Phosphorus: 7.6 mg/dL — ABNORMAL HIGH (ref 2.5–4.6)

## 2022-12-08 LAB — POC OCCULT BLOOD, ED: Fecal Occult Bld: POSITIVE — AB

## 2022-12-08 LAB — MAGNESIUM: Magnesium: 2.9 mg/dL — ABNORMAL HIGH (ref 1.7–2.4)

## 2022-12-08 LAB — PREPARE RBC (CROSSMATCH)

## 2022-12-08 MED ORDER — FLUDROCORTISONE 0.1 MG/ML ORAL SUSPENSION: 0.1000 mg | Freq: Every day | ORAL | Status: DC

## 2022-12-08 MED ORDER — ONDANSETRON HCL 4 MG PO TABS: 4.0000 mg | ORAL_TABLET | Freq: Four times a day (QID) | ORAL | Status: DC | PRN

## 2022-12-08 MED ORDER — ALPRAZOLAM 0.5 MG PO TABS: 1.0000 mg | ORAL_TABLET | Freq: Every evening | ORAL | Status: DC | PRN

## 2022-12-08 MED ORDER — SERTRALINE HCL 50 MG PO TABS
50.0000 mg | ORAL_TABLET | Freq: Every day | ORAL | Status: DC
  Administered 2022-12-09: 50 mg via ORAL
  Filled 2022-12-08: qty 1

## 2022-12-08 MED ORDER — SODIUM CHLORIDE 0.9 % IV BOLUS
500.0000 mL | Freq: Once | INTRAVENOUS | Status: AC
  Administered 2022-12-08: 500 mL via INTRAVENOUS

## 2022-12-08 MED ORDER — SODIUM CHLORIDE 0.9% IV SOLUTION
Freq: Once | INTRAVENOUS | Status: AC
  Administered 2022-12-08: 250 mL via INTRAVENOUS

## 2022-12-08 MED ORDER — HYDROCORTISONE 5 MG PO TABS
7.5000 mg | ORAL_TABLET | Freq: Every day | ORAL | Status: DC
  Administered 2022-12-09: 7.5 mg via ORAL
  Filled 2022-12-08 (×2): qty 2

## 2022-12-08 MED ORDER — ONDANSETRON HCL 4 MG/2ML IJ SOLN
4.0000 mg | Freq: Four times a day (QID) | INTRAMUSCULAR | Status: DC | PRN
  Administered 2022-12-09: 4 mg via INTRAVENOUS
  Filled 2022-12-08: qty 2

## 2022-12-08 MED ORDER — FLUDROCORTISONE ACETATE 0.1 MG PO TABS
0.1000 mg | ORAL_TABLET | Freq: Every day | ORAL | Status: DC
  Administered 2022-12-09: 0.1 mg via ORAL
  Filled 2022-12-08: qty 1

## 2022-12-08 MED ORDER — ACETAMINOPHEN 325 MG PO TABS: 650.0000 mg | ORAL_TABLET | Freq: Four times a day (QID) | ORAL | Status: DC | PRN

## 2022-12-08 MED ORDER — ACETAMINOPHEN 650 MG RE SUPP: 650.0000 mg | Freq: Four times a day (QID) | RECTAL | Status: DC | PRN

## 2022-12-08 MED ORDER — SODIUM CHLORIDE 0.9 % IV SOLN: 1000.0000 mg | Freq: Once | INTRAVENOUS | Status: DC

## 2022-12-08 MED ORDER — PANTOPRAZOLE SODIUM 40 MG IV SOLR
40.0000 mg | Freq: Two times a day (BID) | INTRAVENOUS | Status: DC
  Administered 2022-12-08 – 2022-12-09 (×3): 40 mg via INTRAVENOUS
  Filled 2022-12-08 (×3): qty 10

## 2022-12-08 MED ORDER — SODIUM CHLORIDE 0.9 % IV SOLN
510.0000 mg | Freq: Once | INTRAVENOUS | Status: AC
  Administered 2022-12-08: 510 mg via INTRAVENOUS
  Filled 2022-12-08: qty 17

## 2022-12-08 MED ORDER — HYDROCORTISONE 10 MG PO TABS
5.0000 mg | ORAL_TABLET | Freq: Every evening | ORAL | Status: DC
  Administered 2022-12-09: 5 mg via ORAL
  Filled 2022-12-08 (×2): qty 0.5

## 2022-12-08 MED ORDER — DEXTROSE-NACL 5-0.9 % IV SOLN: INTRAVENOUS | Status: DC

## 2022-12-08 MED ORDER — SODIUM CHLORIDE 0.9% FLUSH
3.0000 mL | Freq: Two times a day (BID) | INTRAVENOUS | Status: DC
  Administered 2022-12-08 – 2022-12-09 (×3): 3 mL via INTRAVENOUS

## 2022-12-08 MED ORDER — ALBUMIN HUMAN 25 % IV SOLN
25.0000 g | Freq: Four times a day (QID) | INTRAVENOUS | Status: DC
  Administered 2022-12-08 – 2022-12-09 (×4): 25 g via INTRAVENOUS
  Filled 2022-12-08 (×8): qty 100

## 2022-12-08 MED ORDER — MORPHINE SULFATE (PF) 2 MG/ML IV SOLN
2.0000 mg | Freq: Once | INTRAVENOUS | Status: AC
  Administered 2022-12-08: 2 mg via INTRAVENOUS
  Filled 2022-12-08: qty 1

## 2022-12-08 MED ORDER — SODIUM CHLORIDE 0.9 % IV SOLN: 25.0000 mg | Freq: Once | INTRAVENOUS | Status: DC

## 2022-12-08 MED ORDER — MORPHINE SULFATE (PF) 2 MG/ML IV SOLN
1.0000 mg | INTRAVENOUS | Status: DC | PRN
  Administered 2022-12-09: 1 mg via INTRAVENOUS
  Filled 2022-12-08: qty 1

## 2022-12-08 MED ORDER — LEVOTHYROXINE SODIUM 75 MCG PO TABS
75.0000 ug | ORAL_TABLET | Freq: Every day | ORAL | Status: DC
  Administered 2022-12-09: 75 ug via ORAL
  Filled 2022-12-08: qty 1

## 2022-12-08 NOTE — Assessment & Plan Note (Signed)
-   Currently hypotensive - Hold home meds

## 2022-12-08 NOTE — Assessment & Plan Note (Signed)
-   hx GIB; last EGD April 2023 noted with GAVE and portal hypertensive gastropathy. Short gut possibly also contributing to malabsorption but also FOBT positive and BUN/Cr is 93/1.71 -1 unit PRBC given overnight.  Hemoglobin 8.2 g/dL this morning - s/p IV iron on 12/25 as well - see [place] discussions

## 2022-12-08 NOTE — Assessment & Plan Note (Signed)
-   Follows outpatient with urology, recently seen 11/11/2022 - Has completed palliative radiation - monitor urine output; plans if left hydonephrosis were to worsen would be a nephrostomy tube; she has been asymptomatic

## 2022-12-08 NOTE — ED Triage Notes (Signed)
Pt sent with low hemoglobin from the [facility] center.  Staff reports pt with blood in urine.

## 2022-12-08 NOTE — Assessment & Plan Note (Signed)
-   see malabsorption and short gut

## 2022-12-08 NOTE — Assessment & Plan Note (Signed)
-   Suspect due to her GI losses and overall volume depletion - She has received some fluids and will receive volume resuscitation - Hold off on bicarb drip or further fluids for now given third spacing and hypoalbuminemia - Follow-up repeat BMP

## 2022-12-08 NOTE — Assessment & Plan Note (Signed)
-   DC PPI in setting of transitioning to hospice

## 2022-12-08 NOTE — Assessment & Plan Note (Addendum)
-   baseline creatinine ~ 1.5 - patient presents with increase in creat >0.3 mg/dL above baseline, creat increase >1.5x baseline presumed to have occurred within past 7 days PTA - creat 1.75 and BUN 91 - concern for 3rd spacing from hypoalbuminemia but may also be some blood loss causing hypovolemia - s/p NS bolus; hold off on further IVF for now - start IV albumin; start IV iron; continue PRBC - continue TPN - will give further IVF if necessary

## 2022-12-08 NOTE — Assessment & Plan Note (Addendum)
-   Patient presents with concern for GI bleeding.  She has recently been started on TPN and has now developed generalized edema.  Overall she has had progressive functional decline.  There are multiple systems that are concerning.  GOC discussion held bedside with Jane Andrews, her niece and POA.  Jane Andrews was able to have more discussions with patient after admission.  This morning, during bedside discussion, the patient was able to clearly express her wishes for discontinuing further aggressive medical measures including stopping TPN and no further blood transfusions.  She was able to express understanding that without ongoing medical measures, she may further deteriorate and approach end-of-life. -Patient requesting transition to hospice and was amenable with going to hospice house at discharge if accepted

## 2022-12-08 NOTE — Assessment & Plan Note (Signed)
-   K 5.7 - s/p IVF on admission - hold off on further treatment for now - repeat BMP this evening; further treatment pending repeat

## 2022-12-08 NOTE — Assessment & Plan Note (Signed)
-   Hold metoprolol for now in setting of hypotension - Not on anticoagulation

## 2022-12-08 NOTE — Assessment & Plan Note (Addendum)
-   Continue Synthroid - Check TSH, 3.377

## 2022-12-08 NOTE — Hospital Course (Signed)
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  She presented with weakness/lethargy and had outpatient Hgb check which was low and she was sent to the ER from nursing facility. Hgb on workup noted 6.8 g/dL and was recently 8 g/dL on 12/15 but has steadily downtrended. She was also seen by GI recently on 12/15 for ongoing diarrhea and was recommended to have stool studies checked along with continuing PPI and iron pill.  It was also felt that risk of endoscopic evaluations outweighed benefit unless overt bleeding.  Due to her poor nutritional state and dysphagia, she was started on TPN as noted on 12/02/2022. Her niece is her healthcare power of attorney and also notes that the patient has been exhibiting signs of edema throughout her body which is a new finding for her as well.  The patient denies any shortness of breath. Other workup notable in the ER was positive FOBT.  BUN further elevated, 91, creatinine 1.75. PRBC ordered and she was admitted for further workup and ongoing discussions for [place].  She did have some improvement in hemoglobin after blood transfusion.  Renal function also improved slightly after fluids given in place of TPN.  Despite this improvement, patient continued to still feel poorly.  She had discussions with her niece since admission and decided on discontinuing further medical treatments including TPN and blood transfusions.  She was amenable with transitioning to hospice and was wishing to go to residential hospice at discharge.

## 2022-12-08 NOTE — Assessment & Plan Note (Signed)
-   Continue home hydrocortisone dosing - Blood pressure soft, likely in setting of GI bleed - Blood products and albumin ordered - If blood pressure further downtrends, may need stress dosing - no pressors if refractory; would have more GOC discussions at that time

## 2022-12-08 NOTE — Assessment & Plan Note (Addendum)
-   History of small bowel resection and short gut syndrome - Patient recently started on TPN on 12/02/2022 - see GOC

## 2022-12-08 NOTE — H&P (Signed)
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  She presented with weakness/lethargy and had outpatient Hgb check which was low and she was sent to the ER from nursing facility. Hgb on workup noted 6.8 g/dL and was recently 8 g/dL on 12/15 but has steadily downtrended. She was also seen by GI recently on 12/15 for ongoing diarrhea and was recommended to have stool studies checked along with continuing PPI and iron pill.  It was also felt that risk of endoscopic evaluations outweighed benefit unless overt bleeding.  Due to her poor nutritional state and dysphagia, she was started on TPN as noted on 12/02/2022. Her niece is her healthcare power of attorney and also notes that the patient has been exhibiting signs of edema throughout her body which is a new finding for her as well.  The patient denies any shortness of breath. Other workup notable in the ER was positive FOBT.  BUN further elevated, 91, creatinine 1.75. PRBC ordered and she was admitted for further workup and ongoing discussions for [place].   I have personally briefly reviewed patient's old medical records in [facility] and discussed patient with the ER provider when appropriate/indicated.  Assessment and Plan: * GIB (gastrointestinal bleeding) - hx GIB; last EGD April 2023 noted with GAVE and portal hypertensive gastropathy. Short gut possibly also contributing to malabsorption but also FOBT positive and BUN/Cr is  93/1.71 - poor endoscopy candidate and recently seen by GI as well 12/15; [name] her niece is POA and understands complexity of situation but would like to treat for now and see how patient responds with blood/volume resuscitation - obtain PRBC and transfuse - trend H/H - s/p 500 cc NS bolus in ER - start IV iron and albumin to help with expansion  Addison disease ([place]) - Continue home hydrocortisone dosing - Blood pressure soft, likely in setting of GI bleed - Blood products and albumin ordered - If blood pressure further downtrends, may need stress dosing - no pressors if refractory; would have more GOC discussions at that time  Malabsorption - History of small bowel resection and short gut syndrome - Patient recently started on TPN on 12/02/2022 -Continue TPN per pharmacy for now  Goals of care, counseling/discussion - Patient presents with concern for GI bleeding.  She has recently been started on TPN and has now developed generalized edema.  Overall she has had progressive functional decline.  There are multiple systems that are concerning.  GOC discussion held bedside with [name], her niece and POA.  For now, we will continue on appropriate medical treatment to see if able to stabilize patient.  If she were to worsen or become refractory to treatment, further discussions will continue - DNR/DNI status confirmed.  Vasopressors would also be not in line with goals if she were to worsen, as [name] states no desire for ICU level care as well  Acute renal failure superimposed on stage 3b chronic kidney disease ([place]) - baseline creatinine ~ 1.5 - patient presents with increase in creat >0.3 mg/dL above baseline, creat increase >  1.5x baseline presumed to have occurred within past 7 days PTA - creat 1.75 and BUN 91 - concern for 3rd spacing from hypoalbuminemia but may also be some blood loss causing hypovolemia - s/p NS bolus; hold off on further IVF for now - start IV albumin; start IV  iron; continue PRBC - continue TPN - will give further IVF if necessary   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 ([place]); 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; s/p TURBT 5/23; s/p palliative radiation tx 6/23 - Follows outpatient with urology, recently seen 11/11/2022 - Has completed palliative radiation - monitor urine output; plans if left hydonephrosis were to worsen would be a nephrostomy tube; she has been asymptomatic   Metabolic acidosis - Suspect due to her GI losses and overall volume depletion - She has received some fluids and will receive volume resuscitation - Hold off on bicarb drip or further fluids for now given third spacing and hypoalbuminemia - Follow-up repeat BMP  Hyperkalemia - K 5.7 - s/p IVF on admission - hold off on further treatment for now - repeat BMP this evening; further treatment pending repeat  Protein-calorie malnutrition, severe - see malabsorption and short gut  Anxiety - Continue benzo.  Database reviewed  GERD (gastroesophageal reflux disease) - Continue PPI  Atrial fibrillation (HCC) - Hold metoprolol for now in setting of hypotension - Not on anticoagulation  Acquired hypothyroidism - Continue Synthroid - Check TSH  Hypertension - Currently hypotensive - Hold home meds  Chronic diarrhea - Recently seen by GI on 11/28/2022 -FOBT positive - Check C. difficile and GI pathogen panel    Code Status:     Code Status: DNR  DVT Prophylaxis: SCD     Anticipated disposition is to: [facility]  History: Past Medical History:  Diagnosis Date   Abdominal adhesions 1994   Allergic rhinitis    Anemia    Anxiety and depression    Aortic stenosis    Arthritis    Atrial fibrillation ([place]) 10/2012   Associated with severe anemia and esophageal pill impaction   Breast carcinoma ([place])    Right mastectomy   Cholelithiasis    Essential hypertension    Gastroesophageal reflux disease     Hiatal hernia   History of blood transfusion    Hypothyroidism    Low back pain    Malabsorption    Short gut syndrome following small bowel resection surgery x2   Nephrolithiasis 2004   Painless hematuria   Short gut syndrome    Bowel resection , 2004   Upper GI bleed 2004   Multiple episodes of melena-? due to gastritis or adverse drug effect (nonsteroidals, small bowel ulceration with Fosamax); caused by Pepto-Bismol during one Emergency Department evaluation    Past Surgical History:  Procedure Laterality Date   ABDOMINAL HYSTERECTOMY  12/15/1958   massive gynecologic bleeding post delivery   ANTERIOR CERVICAL DECOMP/DISCECTOMY FUSION N/A 10/17/2022   Procedure: Anterior Cervical Discectomy and Fusion Cervical Three-Four/Cervical Four-Five;  Surgeon: Earnie Larsson, MD;  Location: [facility];  Service: Neurosurgery;  Laterality: N/A;  3C   BOWEL RESECTION     Resulting short gut syndrome   CHOLECYSTECTOMY N/A 06/25/2018   Procedure: LAPAROSCOPIC CHOLECYSTECTOMY WITH INTRAOPERATIVE CHOLANGIOGRAM;  Surgeon: Armandina Gemma, MD;  Location: [facility];  Service: General;  Laterality: N/A;   COLONOSCOPY W/ POLYPECTOMY  12/16/2003   Lipoma; diverticulosis   COLONOSCOPY WITH ESOPHAGOGASTRODUODENOSCOPY (EGD)  11/22/2012   [place]   CYSTOSCOPY W/ RETROGRADES Bilateral 04/16/2022  Procedure: CYSTOSCOPY;  Surgeon: Primus Bravo., MD;  Location: [facility];  Service: Urology;  Laterality: Bilateral;   ESOPHAGOGASTRODUODENOSCOPY (EGD) WITH PROPOFOL N/A 04/04/2022   Procedure: ESOPHAGOGASTRODUODENOSCOPY (EGD) WITH PROPOFOL;  Surgeon: Rogene Houston, MD;  [facility];  Service: Endoscopy;  Laterality: N/A;  210   HIP ARTHROPLASTY Right 06/15/2020   Procedure: ANTERIOR ARTHROPLASTY BIPOLAR HIP (HEMIARTHROPLASTY);  Surgeon: Mcarthur Rossetti, MD;  Location: [facility];  Service: Orthopedics;  Laterality: Right;   IR GASTROSTOMY TUBE MOD SED  10/29/2022   LAPAROSCOPIC LYSIS OF ADHESIONS   12/16/1963   s/p adhesions   MASTECTOMY Right    Carcinoma of the breast; right   TRANSURETHRAL RESECTION OF BLADDER TUMOR N/A 04/16/2022   Procedure: TRANSURETHRAL RESECTION OF BLADDER TUMOR (TURBT);  Surgeon: Primus Bravo., MD;  Location: [facility];  Service: Urology;  Laterality: N/A;   UPPER GASTROINTESTINAL ENDOSCOPY       reports that she has quit smoking. Her smoking use included cigarettes. She has a 30.00 pack-year smoking history. She has never been exposed to tobacco smoke. She has never used smokeless tobacco. She reports that she does not drink alcohol and does not use drugs.  Allergies  Allergen Reactions   Codeine Nausea And Vomiting    Family History  Problem Relation Age of Onset   Anuerysm Father    Rheum arthritis Sister    Healthy Sister    COPD Sister    Healthy Brother    Cancer Other    Colon cancer Neg Hx    Home Medications: Prior to Admission medications   Medication Sig Start Date End Date Taking? Authorizing Provider  acetaminophen (TYLENOL) 500 MG tablet Take 500 mg by mouth every 8 (eight) hours.    [provider]  ALPRAZolam Duanne Moron) 0.5 MG tablet Take 2 tablets (1 mg total) by mouth at bedtime. 12/03/22   Gerlene Fee, NP  Amino Acid Infusion in D10W (CLINIMIX/DEXTROSE, 4.25/10, IV) Inject 100 mLs into the vein in the morning and at bedtime.    [provider]  amLODipine (NORVASC) 10 MG tablet Place 1 tablet (10 mg total) into feeding tube daily. 10/31/22   Orson Eva, MD  clotrimazole (LOTRIMIN) 1 % cream Apply 1 Application topically.  apply to sacral area and buttocks after each incontinent episode to be followed by zinc oxide with incontinent episode    [provider]  diphenoxylate-atropine (LOMOTIL) 2.5-0.025 MG tablet Take 2 tablets by mouth every 8 (eight) hours.    [provider]  famotidine (PEPCID) 20 MG tablet Place 1 tablet (20 mg total) into feeding tube daily. 10/31/22   Orson Eva, MD   ferrous sulfate 325 (65 FE) MG tablet Take 1 tablet (325 mg total) by mouth 2 (two) times daily with a meal. 12/04/22   Carlan, Chelsea L, NP  fludrocortisone (FLORINEF) 0.'1mg'$ /mL SUSP Take 0.1 mg by mouth daily.    [provider]  FOSFOMYCIN TROMETHAMINE PO Take 3 g by mouth. Once a day every 10 days    [provider]  gabapentin (NEURONTIN) 250 MG/5ML solution Place 2 mLs (100 mg total) into feeding tube 2 (two) times daily at 10 am and 4 pm. 10/31/22   Tat, Shanon Brow, MD  gabapentin (NEURONTIN) 250 MG/5ML solution Place 4 mLs (200 mg total) into feeding tube at bedtime. 10/31/22   Orson Eva, MD  Heparin Sod, Pork, Lock Flush (HEPARIN FLUSH) 10 UNIT/ML SOLN injection 3 mLs in the morning and at bedtime.  [provider]  hydrocortisone (CORTEF) 5 MG tablet Take 5 mg by mouth daily. Give 7.5 mg in the AM and 5 mg in the PM    [provider]  levothyroxine (SYNTHROID) 75 MCG tablet Take 1 tablet (75 mcg total) by mouth daily before breakfast. 07/09/20   Gerlene Fee, NP  loperamide (IMODIUM A-D) 2 MG tablet Take 2 mg by mouth every 4 (four) hours.    [provider]  methocarbamol (ROBAXIN) 750 MG tablet Take 1 tablet (750 mg total) by mouth 4 (four) times daily. 10/18/22   Meyran, Ocie Cornfield, NP  metoprolol tartrate (LOPRESSOR) 25 MG tablet Take 75 mg by mouth 2 (two) times daily.    [provider]  Multiple Vitamins-Minerals (THEREMS-M) TABS Take by mouth. 9 mg iron-400 mcg; gastric tube Once A Day    [provider]  nystatin (MYCOSTATIN) 100000 UNIT/ML suspension Take 10 mLs by mouth 4 (four) times daily. Swish and swallow    [provider]  ondansetron (ZOFRAN) 4 MG tablet Take 4 mg by mouth every 6 (six) hours as needed for nausea or vomiting.    [provider]  pantoprazole (PROTONIX) 40 MG tablet Take 1 tablet (40 mg total) by mouth daily before breakfast. 12/04/22   Carlan, Chelsea L, NP  sertraline  (ZOLOFT) 50 MG tablet Take 50 mg by mouth daily.    [provider]  sodium chloride 1 g tablet Take 1 g by mouth 2 (two) times daily.    [provider]  sodium chloride flush (NS) 0.9 % SOLN Inject 5 mLs into the vein 2 (two) times daily.    [provider]  ZINC OXIDE EX Apply topically. Apply to sacrum and perineal areas due to redness    [provider]    Review of Systems:  Review of Systems  Constitutional:  Positive for malaise/fatigue. Negative for chills and fever.  HENT: Negative.    Eyes: Negative.   Respiratory: Negative.    Cardiovascular:  Positive for leg swelling.  Gastrointestinal:  Positive for diarrhea.  Genitourinary: Negative.   Musculoskeletal: Negative.   Skin: Negative.   Neurological:  Positive for weakness.  Endo/Heme/Allergies: Negative.   Psychiatric/Behavioral: Negative.      Physical Exam:  Vitals:   12/08/22 1256 12/08/22 1300 12/08/22 1330 12/08/22 1400  BP: (!) 100/47 (!) 97/48 (!) 87/35 (!) 117/52  Pulse: 78 77 81 86  Resp: 16     Temp: 97.9 F (36.6 C)     TempSrc: Oral     SpO2: 93% 93% 94% 92%  Weight:      Height:       Physical Exam Constitutional:      Comments: Fatigued and weak appearing elderly woman resting in bed in no distress and is pleasant  HENT:     Head: Normocephalic and atraumatic.     Mouth/Throat:     Mouth: Mucous membranes are dry.  Eyes:     Extraocular Movements: Extraocular movements intact.  Cardiovascular:     Rate and Rhythm: Normal rate and regular rhythm.  Pulmonary:     Effort: Pulmonary effort is normal. No respiratory distress.     Breath sounds: Normal breath sounds. No wheezing.  Abdominal:     General: Bowel sounds are normal. There is no distension.     Palpations: Abdomen is soft.     Tenderness: There is no abdominal tenderness.  Musculoskeletal:        General: Normal  range of motion.     Cervical back: Normal range of motion and neck supple.      Comments: Generalized edema throughout worse in legs, 2-3+  Skin:    General: Skin is warm and dry.  Neurological:     General: No focal deficit present.  Psychiatric:        Mood and Affect: Mood normal.        Behavior: Behavior normal.      Labs on Admission:  I have personally reviewed following labs and imaging studies Results for orders placed or performed during the hospital encounter of 12/08/22 (from the past 24 hour(s))  POC occult blood, ED     Status: Abnormal   Collection Time: 12/08/22 11:53 AM  Result Value Ref Range   Fecal Occult Bld POSITIVE (A) NEGATIVE  Comprehensive metabolic panel     Status: Abnormal   Collection Time: 12/08/22 12:28 PM  Result Value Ref Range   Sodium 135 135 - 145 mmol/L   Potassium 5.7 (H) 3.5 - 5.1 mmol/L   Chloride 113 (H) 98 - 111 mmol/L   CO2 15 (L) 22 - 32 mmol/L   Glucose, Bld 133 (H) 70 - 99 mg/dL   BUN 93 (H) 8 - 23 mg/dL   Creatinine, Ser 1.71 (H) 0.44 - 1.00 mg/dL   Calcium 8.2 (L) 8.9 - 10.3 mg/dL   Total Protein 5.7 (L) 6.5 - 8.1 g/dL   Albumin 2.5 (L) 3.5 - 5.0 g/dL   AST 11 (L) 15 - 41 U/L   ALT 13 0 - 44 U/L   Alkaline Phosphatase 67 38 - 126 U/L   Total Bilirubin 0.7 0.3 - 1.2 mg/dL   GFR, Estimated 27 (L) >60 mL/min   Anion gap 7 5 - 15  CBC with Differential     Status: Abnormal   Collection Time: 12/08/22 12:28 PM  Result Value Ref Range   WBC 7.9 4.0 - 10.5 K/uL   RBC 2.40 (L) 3.87 - 5.11 MIL/uL   Hemoglobin 7.0 (L) 12.0 - 15.0 g/dL   HCT 22.1 (L) 36.0 - 46.0 %   MCV 92.1 80.0 - 100.0 fL   MCH 29.2 26.0 - 34.0 pg   MCHC 31.7 30.0 - 36.0 g/dL   RDW 22.0 (H) 11.5 - 15.5 %   Platelets 170 150 - 400 K/uL   nRBC 0.0 0.0 - 0.2 %   Neutrophils Relative % 71 %   Neutro Abs 5.5 1.7 - 7.7 K/uL   Lymphocytes Relative 11 %   Lymphs Abs 0.9 0.7 - 4.0 K/uL   Monocytes Relative 10 %   Monocytes Absolute 0.8 0.1 - 1.0 K/uL   Eosinophils Relative 2 %   Eosinophils Absolute 0.2 0.0 - 0.5 K/uL   Basophils Relative 0  %   Basophils Absolute 0.0 0.0 - 0.1 K/uL   WBC Morphology DOHLE BODIES    RBC Morphology MORPHOLOGY UNREMARKABLE    Smear Review MORPHOLOGY UNREMARKABLE    Immature Granulocytes 6 %   Abs Immature Granulocytes 0.46 (H) 0.00 - 0.07 K/uL   Polychromasia PRESENT   Protime-INR     Status: None   Collection Time: 12/08/22 12:28 PM  Result Value Ref Range   Prothrombin Time 14.1 11.4 - 15.2 seconds   INR 1.1 0.8 - 1.2  Type and screen [facility]     Status: None   Collection Time: 12/08/22 12:28 PM  Result Value Ref Range   ABO/RH(D) O POS  Antibody Screen POS    Sample Expiration      12/11/2022,2359 Performed at [facility], [address], [place], [place] [address]   Prepare RBC (crossmatch)     Status: None   Collection Time: 12/08/22 12:28 PM  Result Value Ref Range   Order Confirmation      ORDER PROCESSED BY BLOOD BANK Performed at [facility] [address] [place], [place] [address]      Radiological Exams on Admission: No results found. No orders to display      Dwyane Dee, MD [facility] 12/08/2022, 2:52 PM

## 2022-12-08 NOTE — ED Notes (Signed)
We did not feel comfortable doing ortho static on room 18. She is very weak and can't herself up.

## 2022-12-08 NOTE — Assessment & Plan Note (Signed)
-   Recently seen by GI on 11/28/2022 -FOBT positive -Negative C. difficile and GI pathogen panel -DC enteric precautions

## 2022-12-08 NOTE — ED Notes (Addendum)
Lab called and blood was positive for antibody screen and blood would be needed to be sent to [facility]. It will be awhile before blood is ready for transfer. EDP and Nurse aware.

## 2022-12-08 NOTE — Assessment & Plan Note (Signed)
-   Continue benzo.  Database reviewed

## 2022-12-08 NOTE — Assessment & Plan Note (Deleted)
-   baseline creatinine ~ 1 - patient presents with increase in creat >0.3 mg/dL above baseline, creat increase >1.5x baseline presumed to have occurred within past 7 days PTA - creat 1.75 and BUN 91 - concern for 3rd spacing from hypoalbuminemia but may also be some blood loss causing hypovolemia - s/p NS bolus; hold off on further IVF for now - start IV albumin; start IV iron; continue PRBC - continue TPN - will give further IVF if necessary

## 2022-12-08 NOTE — ED Provider Notes (Signed)
[facility] EMERGENCY DEPARTMENT Provider Note   CSN: [id number] Arrival date & time: 12/08/22  1107     History  Chief Complaint  Patient presents with   Abnormal Lab    Jane Andrews is a 88 y.o. female.   Abnormal Lab  Patient has a history of atrial fibrillation, reflux, recurrent GI bleeding, malabsorption, breast cancer, short gut syndrome, aortic stenosis.  Patient is currently residing at a nursing facility.  Family states that the nursing facility checked her blood count today and noted that she had a drop in her hemoglobin.  She was sent to the ED for further evaluation.  Patient has been having diarrhea at least for the last week.  She was seen at her GI doctor's office on the 21st.  Patient had been noted to have some episodes of GI bleeding but considering her age and comorbidities there was no plan to follow-up with a colonoscopy.  Plan was to monitor for obvious GI bleeding.  Patient has had some intermittent episodes of blood in her stools.  No hematemesis.  Patient is not able to provide any history herself.  Family states the patient did sustain a superficial skin tear at the nursing facility recently.  They have been keeping it dressed and covered.  Home Medications Prior to Admission medications   Medication Sig Start Date End Date Taking? Authorizing Provider  acetaminophen (TYLENOL) 500 MG tablet Take 500 mg by mouth every 8 (eight) hours.    [provider]  ALPRAZolam Duanne Moron) 0.5 MG tablet Take 2 tablets (1 mg total) by mouth at bedtime. 12/03/22   Gerlene Fee, NP  Amino Acid Infusion in D10W (CLINIMIX/DEXTROSE, 4.25/10, IV) Inject 100 mLs into the vein in the morning and at bedtime.    [provider]  amLODipine (NORVASC) 10 MG tablet Place 1 tablet (10 mg total) into feeding tube daily. 10/31/22   Orson Eva, MD  clotrimazole (LOTRIMIN) 1 % cream Apply 1 Application topically.  apply to sacral area and buttocks after each  incontinent episode to be followed by zinc oxide with incontinent episode    [provider]  diphenoxylate-atropine (LOMOTIL) 2.5-0.025 MG tablet Take 2 tablets by mouth every 8 (eight) hours.    [provider]  famotidine (PEPCID) 20 MG tablet Place 1 tablet (20 mg total) into feeding tube daily. 10/31/22   Orson Eva, MD  ferrous sulfate 325 (65 FE) MG tablet Take 1 tablet (325 mg total) by mouth 2 (two) times daily with a meal. 12/04/22   Carlan, Chelsea L, NP  fludrocortisone (FLORINEF) 0.'1mg'$ /mL SUSP Take 0.1 mg by mouth daily.    [provider]  FOSFOMYCIN TROMETHAMINE PO Take 3 g by mouth. Once a day every 10 days    [provider]  gabapentin (NEURONTIN) 250 MG/5ML solution Place 2 mLs (100 mg total) into feeding tube 2 (two) times daily at 10 am and 4 pm. 10/31/22   Tat, Shanon Brow, MD  gabapentin (NEURONTIN) 250 MG/5ML solution Place 4 mLs (200 mg total) into feeding tube at bedtime. 10/31/22   Orson Eva, MD  Heparin Sod, Pork, Lock Flush (HEPARIN FLUSH) 10 UNIT/ML SOLN injection 3 mLs in the morning and at bedtime.    [provider]  hydrocortisone (CORTEF) 5 MG tablet Take 5 mg by mouth daily. Give 7.5 mg in the AM and 5 mg in the PM    [provider]  levothyroxine (SYNTHROID) 75 MCG tablet Take 1 tablet (75 mcg total)  by mouth daily before breakfast. 07/09/20   Gerlene Fee, NP  loperamide (IMODIUM A-D) 2 MG tablet Take 2 mg by mouth every 4 (four) hours.    [provider]  methocarbamol (ROBAXIN) 750 MG tablet Take 1 tablet (750 mg total) by mouth 4 (four) times daily. 10/18/22   Meyran, Ocie Cornfield, NP  metoprolol tartrate (LOPRESSOR) 25 MG tablet Take 75 mg by mouth 2 (two) times daily.    [provider]  Multiple Vitamins-Minerals (THEREMS-M) TABS Take by mouth. 9 mg iron-400 mcg; gastric tube Once A Day    [provider]  nystatin (MYCOSTATIN) 100000 UNIT/ML suspension Take 10 mLs by mouth 4  (four) times daily. Swish and swallow    [provider]  ondansetron (ZOFRAN) 4 MG tablet Take 4 mg by mouth every 6 (six) hours as needed for nausea or vomiting.    [provider]  pantoprazole (PROTONIX) 40 MG tablet Take 1 tablet (40 mg total) by mouth daily before breakfast. 12/04/22   Carlan, Chelsea L, NP  sertraline (ZOLOFT) 50 MG tablet Take 50 mg by mouth daily.    [provider]  sodium chloride 1 g tablet Take 1 g by mouth 2 (two) times daily.    [provider]  sodium chloride flush (NS) 0.9 % SOLN Inject 5 mLs into the vein 2 (two) times daily.    [provider]  ZINC OXIDE EX Apply topically. Apply to sacrum and perineal areas due to redness    [provider]      Allergies    Codeine    Review of Systems   Review of Systems  Physical Exam Updated Vital Signs BP (!) 117/52   Pulse 86   Temp 97.9 F (36.6 C) (Oral)   Resp 16   Ht 1.549 m ('5\' 1"'$ )   Wt 55.5 kg   SpO2 92%   BMI 23.11 kg/m  Physical Exam Vitals and nursing note reviewed.  Constitutional:      Appearance: She is well-developed. She is not diaphoretic.     Comments: Elderly, frail  HENT:     Head: Normocephalic and atraumatic.     Right Ear: External ear normal.     Left Ear: External ear normal.  Eyes:     General: No scleral icterus.       Right eye: No discharge.        Left eye: No discharge.     Conjunctiva/sclera: Conjunctivae normal.  Neck:     Trachea: No tracheal deviation.  Cardiovascular:     Rate and Rhythm: Normal rate and regular rhythm.  Pulmonary:     Effort: Pulmonary effort is normal. No respiratory distress.     Breath sounds: Normal breath sounds. No stridor. No wheezing or rales.  Abdominal:     General: Bowel sounds are normal. There is no distension.     Palpations: Abdomen is soft.     Tenderness: There is no abdominal tenderness. There is no guarding or rebound.  Genitourinary:    Comments: No gross blood  in the stool, Hemoccult positive Musculoskeletal:        General: No tenderness or deformity.     Cervical back: Neck supple.     Comments: Superficial skin tear noted right lower extremity, no active bleeding  Skin:    General: Skin is warm and dry.     Findings: No rash.  Neurological:     General: No focal deficit present.  Mental Status: She is alert.     Cranial Nerves: No cranial nerve deficit, dysarthria or facial asymmetry.     Sensory: No sensory deficit.     Motor: No abnormal muscle tone or seizure activity.     Coordination: Coordination normal.  Psychiatric:        Mood and Affect: Mood normal.     ED Results / Procedures / Treatments   Labs (all labs ordered are listed, but only abnormal results are displayed) Labs Reviewed  COMPREHENSIVE METABOLIC PANEL - Abnormal; Notable for the following components:      Result Value   Potassium 5.7 (*)    Chloride 113 (*)    CO2 15 (*)    Glucose, Bld 133 (*)    BUN 93 (*)    Creatinine, Ser 1.71 (*)    Calcium 8.2 (*)    Total Protein 5.7 (*)    Albumin 2.5 (*)    AST 11 (*)    GFR, Estimated 27 (*)    All other components within normal limits  CBC WITH DIFFERENTIAL/PLATELET - Abnormal; Notable for the following components:   RBC 2.40 (*)    Hemoglobin 7.0 (*)    HCT 22.1 (*)    RDW 22.0 (*)    Abs Immature Granulocytes 0.46 (*)    All other components within normal limits  POC OCCULT BLOOD, ED - Abnormal; Notable for the following components:   Fecal Occult Bld POSITIVE (*)    All other components within normal limits  C DIFFICILE QUICK SCREEN W PCR REFLEX    GASTROINTESTINAL PANEL BY PCR, STOOL (REPLACES STOOL CULTURE)  PROTIME-INR  HEMOGLOBIN AND HEMATOCRIT, BLOOD  TSH  TYPE AND SCREEN  PREPARE RBC (CROSSMATCH)    EKG None  Radiology No results found.  Procedures Procedures    Medications Ordered in ED Medications  albumin human 25 % solution 25 g (has no administration in time range)   pantoprazole (PROTONIX) injection 40 mg (has no administration in time range)  iron dextran complex (INFED) 25 mg in sodium chloride 0.9 % 50 mL IVPB (has no administration in time range)    Followed by  iron dextran complex (INFED) 1,000 mg in sodium chloride 0.9 % 500 mL IVPB (has no administration in time range)  0.9 %  sodium chloride infusion (Manually program via Guardrails IV Fluids) (250 mLs Intravenous New Bag/Given 12/08/22 1253)  sodium chloride 0.9 % bolus 500 mL (500 mLs Intravenous New Bag/Given 12/08/22 1412)    ED Course/ Medical Decision Making/ A&P Clinical Course as of 12/08/22 1436  Mon Dec 08, 2022  1327 CBC with Differential(!) Hemoglobin is decreased at 7.0 [JK]  1327 Comprehensive metabolic panel(!) Elevated BUN and creatinine.  None anion gap metabolic acidosis with bicarb of 15.  Potassium also increased at 5.7 [JK]  1436 Case discussed with Dr Sabino Gasser regarding admission [JK]    Clinical Course User Index [JK] Dorie Rank, MD                           Medical Decision Making Problems Addressed: AKI (acute kidney injury) [facility]): acute illness or injury that poses a threat to life or bodily functions Gastrointestinal hemorrhage, unspecified gastrointestinal hemorrhage type: acute illness or injury that poses a threat to life or bodily functions Hyperkalemia: acute illness or injury that poses a threat to life or bodily functions  Amount and/or Complexity of Data Reviewed External Data Reviewed: notes.  Details: Reviewed recent GI notes Labs: ordered. Decision-making details documented in ED Course.  Risk Prescription drug management. Decision regarding hospitalization.   Patient presented to the ED for evaluation of worsening anemia.  Patient had laboratory test today at the nursing facility.  They noted worsening anemia as well as worsening renal function.  Patient has been having issues with recurrent GI bleeding.  Family has been treating with fluids  and intermittent blood transfusion but no plans for endoscopy, colonoscopy or other more invasive procedures considering her age.  Discussed goals of care with patient's daughter.  They are comfortable with fluids and blood transfusions.  They are okay with tube feeding through her PEG tube.  Would not want CPR, intubation or aggressive intensive care unit treatment.  I will consult the medical service for admission and further treatment        Final Clinical Impression(s) / ED Diagnoses Final diagnoses:  Gastrointestinal hemorrhage, unspecified gastrointestinal hemorrhage type  AKI (acute kidney injury) ([place])  Hyperkalemia    Rx / DC Orders ED Discharge Orders     None         Dorie Rank, MD 12/08/22 1436

## 2022-12-09 DIAGNOSIS — C670 Malignant neoplasm of trigone of bladder: Secondary | ICD-10-CM

## 2022-12-09 DIAGNOSIS — K922 Gastrointestinal hemorrhage, unspecified: Secondary | ICD-10-CM | POA: Diagnosis not present

## 2022-12-09 DIAGNOSIS — K909 Intestinal malabsorption, unspecified: Secondary | ICD-10-CM | POA: Diagnosis not present

## 2022-12-09 DIAGNOSIS — Z7189 Other specified counseling: Secondary | ICD-10-CM | POA: Diagnosis not present

## 2022-12-09 DIAGNOSIS — R600 Localized edema: Secondary | ICD-10-CM | POA: Insufficient documentation

## 2022-12-09 DIAGNOSIS — R635 Abnormal weight gain: Secondary | ICD-10-CM | POA: Insufficient documentation

## 2022-12-09 DIAGNOSIS — E271 Primary adrenocortical insufficiency: Secondary | ICD-10-CM | POA: Diagnosis not present

## 2022-12-09 LAB — CBC WITH DIFFERENTIAL/PLATELET
Abs Immature Granulocytes: 0.36 10*3/uL — ABNORMAL HIGH (ref 0.00–0.07)
Basophils Absolute: 0 10*3/uL (ref 0.0–0.1)
Basophils Relative: 0 %
Eosinophils Absolute: 0.2 10*3/uL (ref 0.0–0.5)
Eosinophils Relative: 2 %
HCT: 25.9 % — ABNORMAL LOW (ref 36.0–46.0)
Hemoglobin: 8.2 g/dL — ABNORMAL LOW (ref 12.0–15.0)
Immature Granulocytes: 4 %
Lymphocytes Relative: 10 %
Lymphs Abs: 0.9 10*3/uL (ref 0.7–4.0)
MCH: 28.9 pg (ref 26.0–34.0)
MCHC: 31.7 g/dL (ref 30.0–36.0)
MCV: 91.2 fL (ref 80.0–100.0)
Monocytes Absolute: 1.1 10*3/uL — ABNORMAL HIGH (ref 0.1–1.0)
Monocytes Relative: 11 %
Neutro Abs: 6.8 10*3/uL (ref 1.7–7.7)
Neutrophils Relative %: 73 %
Platelets: 179 10*3/uL (ref 150–400)
RBC: 2.84 MIL/uL — ABNORMAL LOW (ref 3.87–5.11)
RDW: 20.4 % — ABNORMAL HIGH (ref 11.5–15.5)
WBC: 9.3 10*3/uL (ref 4.0–10.5)
nRBC: 0.2 % (ref 0.0–0.2)

## 2022-12-09 LAB — GASTROINTESTINAL PANEL BY PCR, STOOL (REPLACES STOOL CULTURE)

## 2022-12-09 LAB — COMPREHENSIVE METABOLIC PANEL
ALT: 11 U/L (ref 0–44)
AST: 11 U/L — ABNORMAL LOW (ref 15–41)
Albumin: 3.7 g/dL (ref 3.5–5.0)
Alkaline Phosphatase: 63 U/L (ref 38–126)
Anion gap: 7 (ref 5–15)
BUN: 80 mg/dL — ABNORMAL HIGH (ref 8–23)
CO2: 17 mmol/L — ABNORMAL LOW (ref 22–32)
Calcium: 8.7 mg/dL — ABNORMAL LOW (ref 8.9–10.3)
Chloride: 116 mmol/L — ABNORMAL HIGH (ref 98–111)
Creatinine, Ser: 1.42 mg/dL — ABNORMAL HIGH (ref 0.44–1.00)
GFR, Estimated: 34 mL/min — ABNORMAL LOW (ref >60)
Glucose, Bld: 102 mg/dL — ABNORMAL HIGH (ref 70–99)
Potassium: 5.3 mmol/L — ABNORMAL HIGH (ref 3.5–5.1)
Sodium: 140 mmol/L (ref 135–145)
Total Bilirubin: 0.8 mg/dL (ref 0.3–1.2)
Total Protein: 6.3 g/dL — ABNORMAL LOW (ref 6.5–8.1)

## 2022-12-09 LAB — PHOSPHORUS: Phosphorus: 6 mg/dL — ABNORMAL HIGH (ref 2.5–4.6)

## 2022-12-09 LAB — HEMOGLOBIN AND HEMATOCRIT, BLOOD
HCT: 25.3 % — ABNORMAL LOW (ref 36.0–46.0)
Hemoglobin: 8.1 g/dL — ABNORMAL LOW (ref 12.0–15.0)

## 2022-12-09 LAB — MAGNESIUM: Magnesium: 2.8 mg/dL — ABNORMAL HIGH (ref 1.7–2.4)

## 2022-12-09 LAB — TRIGLYCERIDES: Triglycerides: 45 mg/dL (ref <150)

## 2022-12-09 MED ORDER — GLYCOPYRROLATE 0.2 MG/ML IJ SOLN: 0.2000 mg | INTRAMUSCULAR | Status: DC | PRN

## 2022-12-09 MED ORDER — MORPHINE SULFATE (PF) 2 MG/ML IV SOLN: 2.0000 mg | INTRAVENOUS | Status: DC | PRN

## 2022-12-09 MED ORDER — POLYVINYL ALCOHOL 1.4 % OP SOLN: 1.0000 [drp] | Freq: Four times a day (QID) | OPHTHALMIC | Status: DC | PRN

## 2022-12-09 MED ORDER — LORAZEPAM 2 MG/ML PO CONC: 1.0000 mg | ORAL | Status: DC | PRN

## 2022-12-09 MED ORDER — JUVEN PO PACK: 1.0000 | PACK | Freq: Two times a day (BID) | ORAL | Status: DC

## 2022-12-09 MED ORDER — GLYCOPYRROLATE 1 MG PO TABS: 1.0000 mg | ORAL_TABLET | ORAL | Status: DC | PRN

## 2022-12-09 MED ORDER — LORAZEPAM 2 MG/ML IJ SOLN: 1.0000 mg | INTRAMUSCULAR | Status: DC | PRN

## 2022-12-09 MED ORDER — LORAZEPAM 1 MG PO TABS: 1.0000 mg | ORAL_TABLET | ORAL | Status: DC | PRN

## 2022-12-09 NOTE — Progress Notes (Signed)
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  She presented with weakness/lethargy and had outpatient Hgb check which was low and she was sent to [facility] from nursing facility. Hgb on workup noted 6.8 g/dL and was recently 8 g/dL on 12/15 but has steadily downtrended. She was also seen by GI recently on 12/15 for ongoing diarrhea and was recommended to have stool studies checked along with continuing PPI and iron pill.  It was also felt that risk of endoscopic evaluations outweighed benefit unless overt bleeding.  Due to her poor nutritional state and dysphagia, she was started on TPN as noted on 12/02/2022. Her niece is her healthcare power of attorney and also notes that Jane Andrews has been exhibiting signs of edema throughout her body which is a new finding for her as well.  Jane Andrews denies any shortness of breath. Other workup notable in [facility] ER was positive FOBT.  BUN further elevated, 91, creatinine 1.75. PRBC ordered and she was admitted for further workup and ongoing discussions for [place].  She did have some improvement in hemoglobin after blood transfusion.  Renal function also improved slightly after fluids given in place of TPN.  Despite this improvement, Andrews continued to still feel poorly.  She had discussions with her niece since admission and decided on discontinuing further medical treatments including TPN and blood transfusions.  She was amenable with transitioning to hospice and was wishing to go to residential  hospice at discharge.  Interval History:  No events overnight.  Jane Andrews this morning and we had further discussions with Andrews.  She was wishing for stopping treatments and transitioning to hospice.  Assessment and Plan: * GIB (gastrointestinal bleeding) - hx GIB; last EGD April 2023 noted with GAVE and portal hypertensive gastropathy. Short gut possibly also contributing to malabsorption but also FOBT positive and BUN/Cr is 93/1.71 -1 unit PRBC given overnight.  Hemoglobin 8.2 g/dL this morning - s/p IV iron on 12/25 as well - see [place] discussions  Goals of care, counseling/discussion - Andrews presents with concern for GI bleeding.  She has recently been started on TPN and has now developed generalized edema.  Overall she has had progressive functional decline.  There are multiple systems that are concerning.  GOC discussion held Andrews with [name], her niece and POA.  [name] was able to have more discussions with Andrews after admission.  This morning, during Andrews discussion, Jane Andrews was able to clearly express her wishes for discontinuing further aggressive medical measures including stopping TPN and no further blood transfusions.  She was able to express understanding that without ongoing medical measures, she may further deteriorate and approach end-of-life. -Andrews requesting transition to hospice and was amenable with going to hospice house at discharge if accepted  Addison disease ([place]) - Continue home hydrocortisone dosing - Blood pressure soft, likely in setting of GI bleed - Blood products and albumin ordered on admission - see [place] - continue hydrocortisone for now  Malabsorption - History of small bowel resection and short gut syndrome - Andrews recently started on TPN  on 12/02/2022 - see GOC  Acute renal failure superimposed on stage 3b chronic kidney disease ([place]) - baseline creatinine ~ 1.5 - Andrews presents with increase in creat >0.3 mg/dL above  baseline, creat increase >1.5x baseline presumed to have occurred within past 7 days PTA - creat 1.75 and BUN 91 - concern for 3rd spacing from hypoalbuminemia but may also be some blood loss causing hypovolemia - s/p NS bolus; hold off on further IVF for now - s/p albumin, iron, and PRBC - creat improved some this am to 1.53  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 ([place]); 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; s/p TURBT 5/23; s/p palliative radiation tx 6/23 - Follows outpatient with urology, recently seen 11/11/2022 - Has completed palliative radiation - monitor urine output; plans if left hydonephrosis were to worsen would be a nephrostomy tube; see [place] discussions  Metabolic acidosis - Suspect due to her GI losses and overall volume depletion - She has received some fluids and will receive volume resuscitation - Hold off on bicarb drip or further fluids for now given third spacing and hypoalbuminemia  Hyperkalemia - K 5.7 - s/p IVF on admission - hold off on further treatment for now - K improved after IVF  Protein-calorie malnutrition, severe - see malabsorption and short gut  Anxiety - Continue benzo.  Database reviewed  GERD (gastroesophageal reflux disease) - DC PPI in setting of transitioning to hospice  Atrial fibrillation ([place]) - Hold metoprolol for now in setting of hypotension - Not on anticoagulation  Acquired hypothyroidism - Continue Synthroid - Check TSH, 3.377  Hypertension - Currently hypotensive - Hold home meds  Chronic diarrhea - Recently seen by GI on 11/28/2022 -FOBT positive -Negative C. difficile and GI pathogen panel -DC enteric precautions   Old records reviewed in assessment of this Andrews  Antimicrobials:   DVT prophylaxis:  SCDs Start: 12/08/22 1540   Code Status:   Code Status: DNR  Mobility Assessment (last 72 hours)     Mobility Assessment     Row Name 12/08/22 2000  12/08/22 1700         Does Andrews have an order for bedrest or is Andrews medically unstable Yes- Bedfast (Level 1) - Complete Yes- Bedfast (Level 1) - Complete               Barriers to discharge:  Disposition Plan:  Possible hospice house Status is: Inpt  Objective: Blood pressure 110/60, pulse 81, temperature 98.2 F (36.8 C), temperature source Oral, resp. rate 20, height '5\' 1"'$  (1.549 m), weight 55.5 kg, SpO2 97 %.  Examination:  Physical Exam Constitutional:      Comments: Fatigued and weak appearing elderly woman resting in bed in no distress and is pleasant  HENT:     Head: Normocephalic and atraumatic.     Mouth/Throat:     Mouth: Mucous membranes are dry.  Eyes:     Extraocular Movements: Extraocular movements intact.  Cardiovascular:     Rate and Rhythm: Normal rate and regular rhythm.  Pulmonary:     Effort: Pulmonary effort is normal. No respiratory distress.     Breath sounds: Normal breath sounds. No wheezing.  Abdominal:     General: Bowel sounds are normal. There is no distension.     Palpations: Abdomen is soft.     Tenderness: There is no abdominal tenderness.  Musculoskeletal:        General: Normal range of motion.  Cervical back: Normal range of motion and neck supple.     Comments: Generalized edema throughout worse in legs, 2-3+  Skin:    General: Skin is warm and dry.  Neurological:     General: No focal deficit present.  Psychiatric:        Mood and Affect: Mood normal.        Behavior: Behavior normal.      Consultants:    Procedures:    Data Reviewed: Results for orders placed or performed during Jane hospital encounter of 12/08/22 (from [facility] past 24 hour(s))  C Difficile Quick Screen w PCR reflex     Status: None   Collection Time: 12/08/22  6:40 PM   Specimen: STOOL  Result Value Ref Range   C Diff antigen NEGATIVE NEGATIVE   C Diff toxin NEGATIVE NEGATIVE   C Diff interpretation No C. difficile detected.    Gastrointestinal Panel by PCR , Stool     Status: None   Collection Time: 12/08/22  6:40 PM   Specimen: STOOL  Result Value Ref Range   Campylobacter species NOT DETECTED NOT DETECTED   Plesimonas shigelloides NOT DETECTED NOT DETECTED   Salmonella species NOT DETECTED NOT DETECTED   Yersinia enterocolitica NOT DETECTED NOT DETECTED   Vibrio species NOT DETECTED NOT DETECTED   Vibrio cholerae NOT DETECTED NOT DETECTED   Enteroaggregative E coli (EAEC) NOT DETECTED NOT DETECTED   Enteropathogenic E coli (EPEC) NOT DETECTED NOT DETECTED   Enterotoxigenic E coli (ETEC) NOT DETECTED NOT DETECTED   Shiga like toxin producing E coli (STEC) NOT DETECTED NOT DETECTED   Shigella/Enteroinvasive E coli (EIEC) NOT DETECTED NOT DETECTED   Cryptosporidium NOT DETECTED NOT DETECTED   Cyclospora cayetanensis NOT DETECTED NOT DETECTED   Entamoeba histolytica NOT DETECTED NOT DETECTED   Giardia lamblia NOT DETECTED NOT DETECTED   Adenovirus F40/41 NOT DETECTED NOT DETECTED   Astrovirus NOT DETECTED NOT DETECTED   Norovirus GI/GII NOT DETECTED NOT DETECTED   Rotavirus A NOT DETECTED NOT DETECTED   Sapovirus (I, II, IV, and V) NOT DETECTED NOT DETECTED  Hemoglobin and hematocrit, blood     Status: Abnormal   Collection Time: 12/08/22  8:29 PM  Result Value Ref Range   Hemoglobin 7.8 (L) 12.0 - 15.0 g/dL   HCT 24.7 (L) 36.0 - 37.0 %  Basic metabolic panel     Status: Abnormal   Collection Time: 12/08/22  8:29 PM  Result Value Ref Range   Sodium 137 135 - 145 mmol/L   Potassium 5.7 (H) 3.5 - 5.1 mmol/L   Chloride 113 (H) 98 - 111 mmol/L   CO2 15 (L) 22 - 32 mmol/L   Glucose, Bld 170 (H) 70 - 99 mg/dL   BUN 87 (H) 8 - 23 mg/dL   Creatinine, Ser 1.56 (H) 0.44 - 1.00 mg/dL   Calcium 8.5 (L) 8.9 - 10.3 mg/dL   GFR, Estimated 30 (L) >60 mL/min   Anion gap 9 5 - 15  CBC with Differential/Platelet     Status: Abnormal   Collection Time: 12/09/22  5:16 AM  Result Value Ref Range   WBC 9.3 4.0 - 10.5  K/uL   RBC 2.84 (L) 3.87 - 5.11 MIL/uL   Hemoglobin 8.2 (L) 12.0 - 15.0 g/dL   HCT 25.9 (L) 36.0 - 46.0 %   MCV 91.2 80.0 - 100.0 fL   MCH 28.9 26.0 - 34.0 pg   MCHC 31.7 30.0 - 36.0 g/dL   RDW  20.4 (H) 11.5 - 15.5 %   Platelets 179 150 - 400 K/uL   nRBC 0.2 0.0 - 0.2 %   Neutrophils Relative % 73 %   Neutro Abs 6.8 1.7 - 7.7 K/uL   Lymphocytes Relative 10 %   Lymphs Abs 0.9 0.7 - 4.0 K/uL   Monocytes Relative 11 %   Monocytes Absolute 1.1 (H) 0.1 - 1.0 K/uL   Eosinophils Relative 2 %   Eosinophils Absolute 0.2 0.0 - 0.5 K/uL   Basophils Relative 0 %   Basophils Absolute 0.0 0.0 - 0.1 K/uL   Immature Granulocytes 4 %   Abs Immature Granulocytes 0.36 (H) 0.00 - 0.07 K/uL  Comprehensive metabolic panel     Status: Abnormal   Collection Time: 12/09/22  5:16 AM  Result Value Ref Range   Sodium 140 135 - 145 mmol/L   Potassium 5.3 (H) 3.5 - 5.1 mmol/L   Chloride 116 (H) 98 - 111 mmol/L   CO2 17 (L) 22 - 32 mmol/L   Glucose, Bld 102 (H) 70 - 99 mg/dL   BUN 80 (H) 8 - 23 mg/dL   Creatinine, Ser 1.42 (H) 0.44 - 1.00 mg/dL   Calcium 8.7 (L) 8.9 - 10.3 mg/dL   Total Protein 6.3 (L) 6.5 - 8.1 g/dL   Albumin 3.7 3.5 - 5.0 g/dL   AST 11 (L) 15 - 41 U/L   ALT 11 0 - 44 U/L   Alkaline Phosphatase 63 38 - 126 U/L   Total Bilirubin 0.8 0.3 - 1.2 mg/dL   GFR, Estimated 34 (L) >60 mL/min   Anion gap 7 5 - 15  Magnesium     Status: Abnormal   Collection Time: 12/09/22  5:16 AM  Result Value Ref Range   Magnesium 2.8 (H) 1.7 - 2.4 mg/dL  Phosphorus     Status: Abnormal   Collection Time: 12/09/22  5:16 AM  Result Value Ref Range   Phosphorus 6.0 (H) 2.5 - 4.6 mg/dL  Triglycerides     Status: None   Collection Time: 12/09/22  5:16 AM  Result Value Ref Range   Triglycerides 45 <150 mg/dL  Hemoglobin and hematocrit, blood     Status: Abnormal   Collection Time: 12/09/22  9:04 AM  Result Value Ref Range   Hemoglobin 8.1 (L) 12.0 - 15.0 g/dL   HCT 25.3 (L) 36.0 - 46.0 %    I have  Reviewed nursing notes, Vitals, and Lab results since pt's last encounter. Pertinent lab results : see above I have ordered labwork to follow up on.  I have reviewed Jane last note from staff over past 24 hours I have discussed pt's care plan and test results with nursing staff, CM/SW, and other staff as appropriate  Time spent: Greater than 50% of Jane 55 minute visit was spent in counseling/coordination of care for Jane Andrews as laid out in Jane A&P.   LOS: 1 day   Dwyane Dee, MD [facility] 12/09/2022, 1:53 PM

## 2022-12-09 NOTE — TOC Transition Note (Signed)
Transition of Care [facility]) - CM/SW Discharge Note   Patient Details  Name: Jane Andrews MRN: [id number] Date of Birth: May 01, 1925  [facility] CM/SW Contact:  Shade Flood, LCSW Phone Number: 12/09/2022, 3:01 PM   Clinical Narrative:     Pt accepted for residential hospice and is agreeable to admit there today. Hospice to arrange transport. RN has called report.  No other TOC needs.  Final next level of care: [place] Barriers to Discharge: Barriers Resolved   Patient Goals and CMS Choice CMS Medicare.gov Compare Post Acute Care list provided to:: Patient Represenative (must comment) Choice offered to / list presented to : Adult Children  Discharge Placement                         Discharge Plan and Services Additional resources added to the After Visit Summary for   In-house Referral: Clinical Social Work   Post Acute Care Choice: Residential Hospice Bed                               Social Determinants of Health (SDOH) Interventions SDOH Screenings   Food Insecurity: No Food Insecurity (12/08/2022)  Housing: Low Risk  (12/08/2022)  Transportation Needs: No Transportation Needs (12/08/2022)  Utilities: Not At Risk (12/08/2022)  Depression (PHQ2-9): Low Risk  (12/05/2022)  Tobacco Use: Medium Risk (12/08/2022)     Readmission Risk Interventions    12/09/2022   12:03 PM 10/21/2022    9:21 AM 06/15/2020   10:21 AM  Readmission Risk Prevention Plan  Medication Screening   Complete  Transportation Screening Complete Complete Complete  Medication Review Press photographer) Complete Complete   HRI or Home Care Consult Complete Complete   SW Recovery Care/Counseling Consult Complete Complete   Palliative Care Screening Complete Not [place] Not Applicable Not Applicable

## 2022-12-09 NOTE — Consult Note (Addendum)
[facility] Nurse Consult Note: Reason for Consult: Consult requested for sacrum.  Performed remotely after review of progress notes in the EMR.  Pt is noted to have a Stage 2 pressure injury to the sacrum; this was present on admission and affected area is 1X2X.1cm; 2 areas separated by a narrow bridge of intact skin, 20% red, 80%  white and macerated and moist, according to the nursing flowsheet These types of wounds can be treated independently by the bedside nurse by using the skin care order set in Epic; foam dressing to sacrum, change Q 3 days or PRN soiling. Please re-consult if further assistance is needed.  Thank-you,  Julien Girt MSN, [place], [place], [place], [place]

## 2022-12-09 NOTE — Progress Notes (Addendum)
Initial Nutrition Assessment  DOCUMENTATION CODES:      INTERVENTION:  TNA per pharmacy  Pureed foods/thin liquids per ST  -1 packet Juven BID, each packet provides 95 calories, 2.5 grams of protein (collagen), and 9.8 grams of carbohydrate (3 grams sugar); also contains 7 grams of L-arginine and L-glutamine, 300 mg vitamin C, 15 mg vitamin E, 1.2 mcg vitamin B-12, 9.5 mg zinc, 200 mg calcium, and 1.5 g  Calcium Beta-hydroxy-Beta-methylbutyrate to support wound healing   NUTRITION DIAGNOSIS:   Inadequate oral intake related to dysphagia, altered GI function (short gut syndrome) as evidenced by per patient/family report.  GOAL:  Patient will meet greater than or equal to 90% of their needs   MONITOR:  Weight trends, PO intake, Labs, Skin (TPN)  REASON FOR ASSESSMENT:   Consult New TPN/TNA  ASSESSMENT: Patient is a 88 yo female with hx of urothelial carcinoma s/p palliative radiation in June. Addison's dz, HTN, GERD, chronic diarrhea, malnutrition and short gut syndrome. Status post PEG tube (placed-10/29/22). Presents with abnormal hemoglobin.  Transitioned to TPN 12/19 (bowel rest) due to ongoing diarrhea (despite elemental enteral formula), malabsorption and dysphagia.   Patient receiving a Dysphagia 1 diet with minimal intake. Niece is bedside and assisted the patient with breakfast. She ate only a couple of bites of eggs.   Acute weight gain. Weight on admission to [facility]- 106.6 lb (48.4 kg). Currently 55.5 kg. Estimated nutrition needs based on weight of 48.4 kg.    Hemoglobin- 6.8 g/dl     Latest Ref Rng & Units 12/09/2022    5:16 AM 12/08/2022    8:29 PM 12/08/2022   12:28 PM  BMP  Glucose 70 - 99 mg/dL 102  170  133   BUN 8 - 23 mg/dL 80  87  93   Creatinine 0.44 - 1.00 mg/dL 1.42  1.56  1.71   Sodium 135 - 145 mmol/L 140  137  135   Potassium 3.5 - 5.1 mmol/L 5.3  5.7  5.7   Chloride 98 - 111 mmol/L 116  113  113   CO2 22 - 32 mmol/L '17  15  15   '$ Calcium 8.9 -  10.3 mg/dL 8.7  8.5  8.2     Diet Order:   Diet Order             DIET - DYS 1 Room service appropriate? Yes; Fluid consistency: Thin  Diet effective now                   EDUCATION NEEDS:  Education needs have been addressed  Skin:   stage 2 to sacrum  Last BM:  12/25 medium  Height:   Ht Readings from Last 1 Encounters:  12/08/22 '5\' 1"'$  (1.549 m)    Weight:   Wt Readings from Last 1 Encounters:  12/08/22 55.5 kg    Ideal Body Weight:   48 kg  BMI:  Body mass index is 23.11 kg/m.  Estimated Nutritional Needs:   Kcal:  1600-1800  Protein:  85-90 gr  Fluid:  >1500 ml daily   Colman Cater MS,RD,CSG,LDN Contact: Shea Evans

## 2022-12-09 NOTE — TOC Initial Note (Signed)
Transition of Care [facility]) - Initial/Assessment Note    Patient Details  Name: Jane Andrews MRN: [id number] Date of Birth: 1925-03-20  [facility]) CM/SW Contact:    Shade Flood, LCSW Phone Number: 12/09/2022, 12:05 PM  Clinical Narrative:                  Pt admitted from [facility]. Per MD, pt/family requesting referral to [facility]. Referral made. Awaiting their review and follow up.  TOC will follow and assist.  Expected Discharge Plan: [place] Barriers to Discharge: Continued Medical Work up   Patient Goals and CMS Choice Patient states their goals for this hospitalization and ongoing recovery are:: residential hospice CMS Medicare.gov Compare Post Acute Care list provided to:: Patient Represenative (must comment) Choice offered to / list presented to : Adult Children      Expected Discharge Plan and Services In-house Referral: Clinical Social Work   Post Acute Care Choice: Residential Hospice Bed Living arrangements for the past 2 months: [place]                                      Prior Living Arrangements/Services Living arrangements for the past 2 months: [place] Lives with:: Facility Resident Patient language and need for interpreter reviewed:: Yes Do you feel safe going back to the place where you live?: Yes      Need for Family Participation in Patient Care: No (Comment) Care giver support system in place?: Yes (comment)   Criminal Activity/Legal Involvement Pertinent to Current Situation/Hospitalization: No - Comment as needed  Activities of Daily Living Home Assistive Devices/Equipment: Wheelchair ADL Screening (condition at time of admission) Patient's cognitive ability adequate to safely complete daily activities?: Yes Is the patient deaf or have difficulty hearing?: No Does the patient have difficulty seeing, even when wearing glasses/contacts?: No Does the  patient have difficulty concentrating, remembering, or making decisions?: Yes Patient able to express need for assistance with ADLs?: No Does the patient have difficulty dressing or bathing?: Yes Independently performs ADLs?: No Does the patient have difficulty walking or climbing stairs?: Yes Weakness of Legs: Both Weakness of Arms/Hands: Both  Permission Sought/Granted Permission sought to share information with : Facility Art therapist granted to share information with : Yes, Verbal Permission Granted     Permission granted to share info w AGENCY: Hospice        Emotional Assessment       Orientation: : Oriented to Self, Oriented to Place, Oriented to  Time, Oriented to Situation Alcohol / Substance Use: Not Applicable Psych Involvement: No (comment)  Admission diagnosis:  Hyperkalemia [E87.5] GIB (gastrointestinal bleeding) [K92.2] AKI (acute kidney injury) ([place]) [N17.9] Gastrointestinal hemorrhage, unspecified gastrointestinal hemorrhage type [K92.2] Patient Active Problem List   Diagnosis Date Noted   Bilateral lower extremity edema 12/09/2022   Weight gain 12/09/2022   GIB (gastrointestinal bleeding) 12/08/2022   Acute renal failure superimposed on stage 3b chronic kidney disease ([place]) 12/08/2022   Goals of care, counseling/discussion 67/59/1638   Metabolic acidosis 46/65/9935   Chronic respiratory failure with hypoxia ([place]) 12/02/2022   Frequent UTI 12/02/2022   Short gut syndrome 12/01/2022   Addison disease ([place]) 11/14/2022   Hyponatremia 11/12/2022   Hypermagnesemia 11/12/2022   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 ([place]); 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; s/p TURBT 5/23; s/p palliative  radiation tx 6/23 11/11/2022   History of UTI 11/11/2022   Hydronephrosis of left kidney - likely secondary to muscle invasive bladder cancer 11/11/2022   Hyperglycemia, drug-induced 11/04/2022    Chronic kidney disease, stage 3b ([place]) 10/29/2022   Protein-calorie malnutrition, severe 10/27/2022   Acute respiratory failure with hypoxia ([place]) 10/20/2022   Overactive bladder 10/20/2022   Hypoalbuminemia due to protein-calorie malnutrition ([place]) 10/20/2022   Anxiety 10/20/2022   Depression 10/20/2022   Cervical stenosis of spinal canal 10/20/2022   CAP (community acquired pneumonia) 10/19/2022   Cervical myelopathy ([place]) 10/17/2022   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 ([place]) 05/23/2022   Central cord syndrome of cervical spinal cord ([place]) 05/19/2022   SAH (subarachnoid hemorrhage) ([place]) 05/13/2022   TBI (traumatic brain injury) ([place]) 05/13/2022   Bladder mass 04/16/2022   Hyperkalemia 04/09/2022   Mixed conductive and sensorineural hearing loss of both ears 03/03/2022   Normocytic anemia 02/11/2022   SIRS (systemic inflammatory response syndrome) ([place]) 02/10/2022   Sinusitis 02/10/2022   Nephrolithiasis 01/07/2022   Vulvar irritation 12/20/2021   Burning with urination 12/20/2021   Symptoms of urinary tract infection 12/20/2021   Diarrhea 03/05/2021   Acute kidney failure, unspecified ([place]) 08/15/2020   Scoliosis 06/26/2020   Thrombocytopenia ([place]) 06/22/2020   Aspiration pneumonitis ([place]) 06/22/2020   Chronic constipation 06/21/2020   Post-menopausal osteoporosis 06/21/2020   Essential tremor 06/21/2020   Vitamin B 12 deficiency 06/21/2020   Major depression, recurrent, chronic ([place]) 06/21/2020   Status post total replacement of right hip    AKI (acute kidney injury) ([place])    Postoperative anemia due to acute blood loss    Closed right hip fracture (HCC) 06/14/2020   IDA (iron deficiency anemia) 02/28/2020   Atrial fibrillation with RVR ([place]) 12/25/2018   GERD (gastroesophageal reflux disease) 12/25/2018   Chronic cholecystitis s/p lap cholecystectomy 06/25/2018 06/18/2018   Vagal reaction 04/12/2016   Abdominal pain 09/19/2014   Small bowel  motility disorder 09/19/2014   Ecchymoses, spontaneous 05/31/2013   Acquired hypothyroidism    Atrial fibrillation (HCC)    Breast carcinoma (HCC)    Malabsorption    CKD (chronic kidney disease) stage 4, GFR 15-29 ml/min (HCC) 10/21/2012   Anemia, normocytic normochromic 07/06/2012   Chronic diarrhea 02/10/2012   Hypertension 02/10/2012   PCP:  Redmond School, MD Pharmacy:   [facility], [place] [address] [place] [address] Phone: [phone] Fax: [phone]     Social Determinants of Health ([place]) Social History: SDOH Screenings   Food Insecurity: No Food Insecurity (12/08/2022)  Housing: Low Risk  (12/08/2022)  Transportation Needs: No Transportation Needs (12/08/2022)  Utilities: Not At Risk (12/08/2022)  Depression (PHQ2-9): Low Risk  (12/05/2022)  Tobacco Use: Medium Risk (12/08/2022)   SDOH Interventions:     Readmission Risk Interventions    12/09/2022   12:03 PM 10/21/2022    9:21 AM 06/15/2020   10:21 AM  Readmission Risk Prevention Plan  Medication Screening   Complete  Transportation Screening Complete Complete Complete  Medication Review (RN Care Manager) Complete Complete   HRI or Home Care Consult Complete Complete   SW Recovery Care/Counseling Consult Complete Complete   Palliative Care Screening Complete Not [place] Not Applicable Not Applicable

## 2022-12-09 NOTE — Progress Notes (Signed)
Report given to Erlene Quan RN at [facility] reflecting patient current status.

## 2022-12-09 NOTE — Discharge Summary (Signed)
Physician Discharge Summary   Jane Andrews IWP:[id number] DOB: Dec 01, 1925 DOA: 12/08/2022  PCP: Jane School, MD  Admit date: 12/08/2022 Discharge date: 12/09/2022 Barriers to discharge: none  Admitted From: nursing facility Disposition:  Hospice house Discharging physician: Jane Dee, MD  Recommendations for Outpatient Follow-up:  Continue comfort care  Home Health:  Equipment/Devices:   Discharge Condition: poor CODE STATUS: DNR Diet recommendation:  Diet Orders (From admission, onward)     Start     Ordered   12/08/22 1540  DIET - DYS 1 Room service appropriate? Yes; Fluid consistency: Thin  Diet effective now       Question Answer Comment  Room service appropriate? 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  She presented with weakness/lethargy and had outpatient Hgb check which was low and she was sent to the ER from nursing facility. Hgb on workup noted 6.8 g/dL and was recently 8 g/dL on 12/15 but has steadily downtrended. She was also seen by GI recently on 12/15 for ongoing diarrhea and was recommended to have stool studies checked along with continuing PPI and iron pill.  It was also felt that risk of endoscopic evaluations outweighed benefit unless overt bleeding.  Due to her poor nutritional state and dysphagia, she was started on TPN as noted on 12/02/2022. Her niece is her healthcare power of attorney and also notes that the patient has been exhibiting signs of edema throughout her body which is a new finding for her as well.  The patient denies any shortness of  breath. Other workup notable in the ER was positive FOBT.  BUN further elevated, 91, creatinine 1.75. PRBC ordered and she was admitted for further workup and ongoing discussions for [place].  She did have some improvement in hemoglobin after blood transfusion.  Renal function also improved slightly after fluids given in place of TPN.  Despite this improvement, patient continued to still feel poorly.  She had discussions with her niece since admission and decided on discontinuing further medical treatments including TPN and blood transfusions.  She was amenable with transitioning to hospice and was wishing to go to residential hospice at discharge.  Assessment and Plan: * GIB (gastrointestinal bleeding) - hx GIB; last EGD April 2023 noted with GAVE and portal hypertensive gastropathy. Short gut possibly also contributing to malabsorption but also FOBT positive and BUN/Cr is 93/1.71 -1 unit PRBC given overnight.  Hemoglobin 8.2 g/dL this morning - s/p IV iron on 12/25 as well - see [place] discussions  Goals of care, counseling/discussion - Patient presents with concern for GI bleeding.  She has recently been started on TPN and has now developed generalized edema.  Overall she has had progressive functional decline.  There are multiple systems that are concerning.  GOC discussion held bedside with Jane Andrews, her niece and POA.  Jane Andrews was able to have more discussions with patient after admission.  This morning, during bedside discussion, the patient was able to clearly express her wishes for discontinuing further aggressive medical measures including stopping TPN and no further blood transfusions.  She was able to express understanding that without ongoing medical  measures, she may further deteriorate and approach end-of-life. -Patient requesting transition to hospice and was amenable with going to hospice house at discharge if accepted  Addison disease ([place]) - Continue home hydrocortisone dosing - Blood  pressure soft, likely in setting of GI bleed - Blood products and albumin ordered on admission - see [place] - continue hydrocortisone for now  Malabsorption - History of small bowel resection and short gut syndrome - Patient recently started on TPN on 12/02/2022 - see GOC  Acute renal failure superimposed on stage 3b chronic kidney disease ([place]) - baseline creatinine ~ 1.5 - patient presents with increase in creat >0.3 mg/dL above baseline, creat increase >1.5x baseline presumed to have occurred within past 7 days PTA - creat 1.75 and BUN 91 - concern for 3rd spacing from hypoalbuminemia but may also be some blood loss causing hypovolemia - s/p NS bolus; hold off on further IVF for now - s/p albumin, iron, and PRBC - creat improved some this am to 1.53  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 ([place]); 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; s/p TURBT 5/23; s/p palliative radiation tx 6/23 - Follows outpatient with urology, recently seen 11/11/2022 - Has completed palliative radiation - monitor urine output; plans if left hydonephrosis were to worsen would be a nephrostomy tube; see [place] discussions  Metabolic acidosis - Suspect due to her GI losses and overall volume depletion - She has received some fluids and will receive volume resuscitation - Hold off on bicarb drip or further fluids for now given third spacing and hypoalbuminemia  Hyperkalemia - K 5.7 - s/p IVF on admission - hold off on further treatment for now - K improved after IVF  Protein-calorie malnutrition, severe - see malabsorption and short gut  Anxiety - Continue benzo.  Database reviewed  GERD (gastroesophageal reflux disease) - DC PPI in setting of transitioning to hospice  Atrial fibrillation (HCC) - Hold metoprolol for now in setting of hypotension - Not on anticoagulation  Acquired hypothyroidism - Continue Synthroid - Check TSH,  3.377  Hypertension - Currently hypotensive - Hold home meds  Chronic diarrhea - Recently seen by GI on 11/28/2022 -FOBT positive -Negative C. difficile and GI pathogen panel -DC enteric precautions   Principal Diagnosis: GIB (gastrointestinal bleeding)  Discharge Diagnoses: Active Hospital Problems   Diagnosis Date Noted   GIB (gastrointestinal bleeding) 12/08/2022    Priority: 1.   Goals of care, counseling/discussion 12/08/2022    Priority: 2.   Addison disease ([place]) 11/14/2022    Priority: 2.   Malabsorption     Priority: 2.   Acute renal failure superimposed on stage 3b chronic kidney disease ([place]) 12/08/2022    Priority: 4.   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   Metabolic acidosis 68/34/1962    Priority: 5.   Hyperkalemia 04/09/2022    Priority: 5.   Protein-calorie malnutrition, severe 10/27/2022   Anxiety 10/20/2022   GERD (gastroesophageal reflux disease) 12/25/2018   Acquired hypothyroidism    Atrial fibrillation ([place])    Chronic diarrhea 02/10/2012   Hypertension 02/10/2012    Resolved Hospital Problems  No resolved problems to display.     Discharge Instructions     Discharge wound care:   Complete by: As directed    Continue foam dressing to sacrum. Change every 3 days or  for soiling      Allergies as of 12/09/2022       Reactions   Codeine Nausea And Vomiting        Medication List     STOP taking these medications    acetaminophen 500 MG tablet Commonly known as: TYLENOL   ALPRAZolam 0.5 MG tablet Commonly known as: XANAX   amLODipine 10 MG tablet Commonly known as: NORVASC   CLINIMIX/DEXTROSE (4.25/10) IV   clotrimazole 1 % cream Commonly known as: LOTRIMIN   diphenoxylate-atropine 2.5-0.025 MG tablet Commonly known as: LOMOTIL   famotidine 20  MG tablet Commonly known as: PEPCID   ferrous sulfate 325 (65 FE) MG tablet   fludrocortisone 0.'1mg'$ /mL Susp Commonly known as: FLORINEF   FOSFOMYCIN TROMETHAMINE PO   gabapentin 250 MG/5ML solution Commonly known as: NEURONTIN   heparin flush 10 UNIT/ML Soln injection   levothyroxine 75 MCG tablet Commonly known as: SYNTHROID   loperamide 2 MG tablet Commonly known as: IMODIUM A-D   methocarbamol 750 MG tablet Commonly known as: ROBAXIN   metoprolol tartrate 25 MG tablet Commonly known as: LOPRESSOR   nystatin 100000 UNIT/ML suspension Commonly known as: MYCOSTATIN   ondansetron 4 MG tablet Commonly known as: ZOFRAN   pantoprazole 40 MG tablet Commonly known as: PROTONIX   sodium chloride 1 g tablet   sodium chloride flush 0.9 % Soln Commonly known as: NS   Therems-M Tabs   ZINC OXIDE EX       TAKE these medications    hydrocortisone 5 MG tablet Commonly known as: CORTEF Take 5 mg by mouth daily. Give 7.5 mg in the AM and 5 mg in the PM   sertraline 50 MG tablet Commonly known as: ZOLOFT Take 50 mg by mouth daily.               Discharge Care Instructions  (From admission, onward)           Start     Ordered   12/09/22 0000  Discharge wound care:       Comments: Continue foam dressing to sacrum. Change every 3 days or for soiling   12/09/22 1501            Allergies  Allergen Reactions   Codeine Nausea And Vomiting    Consultations:   Procedures:   Discharge Exam: BP 110/60 (BP Location: Right Arm)   Pulse 81   Temp 98.2 F (36.8 C) (Oral)   Resp 20   Ht '5\' 1"'$  (1.549 m)   Wt 55.5 kg   SpO2 97%   BMI 23.11 kg/m  Physical Exam Constitutional:      Comments: Fatigued and weak appearing elderly woman resting in bed in no distress and is pleasant  HENT:     Head: Normocephalic and atraumatic.     Mouth/Throat:     Mouth: Mucous membranes are dry.  Eyes:     Extraocular Movements: Extraocular movements intact.   Cardiovascular:     Rate and Rhythm: Normal rate and regular rhythm.  Pulmonary:     Effort: Pulmonary effort is normal. No respiratory distress.     Breath sounds: Normal breath sounds. No wheezing.  Abdominal:     General: Bowel sounds are normal. There is no distension.     Palpations: Abdomen is soft.     Tenderness: There is no abdominal tenderness.  Musculoskeletal:        General: Normal range of motion.     Cervical back: Normal  range of motion and neck supple.     Comments: Generalized edema throughout worse in legs, 2-3+  Skin:    General: Skin is warm and dry.  Neurological:     General: No focal deficit present.  Psychiatric:        Mood and Affect: Mood normal.        Behavior: Behavior normal.      The results of significant diagnostics from this hospitalization (including imaging, microbiology, ancillary and laboratory) are listed below for reference.   Microbiology: Recent Results (from the past 240 hour(s))  C Difficile Quick Screen w PCR reflex     Status: None   Collection Time: 12/08/22  6:40 PM   Specimen: STOOL  Result Value Ref Range Status   C Diff antigen NEGATIVE NEGATIVE Final   C Diff toxin NEGATIVE NEGATIVE Final   C Diff interpretation No C. difficile detected.  Final    Comment: Performed at [facility], [address] [place], [place] [address]  Gastrointestinal Panel by PCR , Stool     Status: None   Collection Time: 12/08/22  6:40 PM   Specimen: STOOL  Result Value Ref Range Status   Campylobacter species NOT DETECTED NOT DETECTED Final   Plesimonas shigelloides NOT DETECTED NOT DETECTED Final   Salmonella species NOT DETECTED NOT DETECTED Final   Yersinia enterocolitica NOT DETECTED NOT DETECTED Final   Vibrio species NOT DETECTED NOT DETECTED Final   Vibrio cholerae NOT DETECTED NOT DETECTED Final   Enteroaggregative E coli (EAEC) NOT DETECTED NOT DETECTED Final   Enteropathogenic E coli (EPEC) NOT DETECTED NOT DETECTED Final    Enterotoxigenic E coli (ETEC) NOT DETECTED NOT DETECTED Final   Shiga like toxin producing E coli (STEC) NOT DETECTED NOT DETECTED Final   Shigella/Enteroinvasive E coli (EIEC) NOT DETECTED NOT DETECTED Final   Cryptosporidium NOT DETECTED NOT DETECTED Final   Cyclospora cayetanensis NOT DETECTED NOT DETECTED Final   Entamoeba histolytica NOT DETECTED NOT DETECTED Final   Giardia lamblia NOT DETECTED NOT DETECTED Final   Adenovirus F40/41 NOT DETECTED NOT DETECTED Final   Astrovirus NOT DETECTED NOT DETECTED Final   Norovirus GI/GII NOT DETECTED NOT DETECTED Final   Rotavirus A NOT DETECTED NOT DETECTED Final   Sapovirus (I, II, IV, and V) NOT DETECTED NOT DETECTED Final    Comment: Performed at [facility], [address] [place], [place] [address]     Labs: BNP (last 3 results) No results for input(s): "BNP" in the last 8760 hours. Basic Metabolic Panel: Recent Labs  Lab 12/08/22 0800 12/08/22 1228 12/08/22 2029 12/09/22 0516  NA 137 135 137 140  K 5.6* 5.7* 5.7* 5.3*  CL 113* 113* 113* 116*  CO2 16* 15* 15* 17*  GLUCOSE 89 133* 170* 102*  BUN 91* 93* 87* 80*  CREATININE 1.75* 1.71* 1.56* 1.42*  CALCIUM 8.1* 8.2* 8.5* 8.7*  MG 2.9*  --   --  2.8*  PHOS 7.6*  --   --  6.0*   Liver Function Tests: Recent Labs  Lab 12/08/22 0800 12/08/22 1228 12/09/22 0516  AST 12* 11* 11*  ALT '12 13 11  '$ ALKPHOS 65 67 63  BILITOT 0.9 0.7 0.8  PROT 5.3* 5.7* 6.3*  ALBUMIN 2.3* 2.5* 3.7   No results for input(s): "LIPASE", "AMYLASE" in the last 168 hours. No results for input(s): "AMMONIA" in the last 168 hours. CBC: Recent Labs  Lab 12/08/22 0800 12/08/22 1228 12/08/22 2029 12/09/22 0516 12/09/22 9470  WBC 7.6 7.9  --  9.3  --   NEUTROABS 5.2 5.5  --  6.8  --   HGB 6.8* 7.0* 7.8* 8.2* 8.1*  HCT 22.0* 22.1* 24.7* 25.9* 25.3*  MCV 92.8 92.1  --  91.2  --   PLT 167 170  --  179  --    Cardiac Enzymes: No results for input(s): "CKTOTAL", "CKMB", "CKMBINDEX",  "TROPONINI" in the last 168 hours. BNP: Invalid input(s): "POCBNP" CBG: No results for input(s): "GLUCAP" in the last 168 hours. D-Dimer No results for input(s): "DDIMER" in the last 72 hours. Hgb A1c No results for input(s): "HGBA1C" in the last 72 hours. Lipid Profile Recent Labs    12/08/22 0800 12/09/22 0516  TRIG 26 45   Thyroid function studies Recent Labs    12/08/22 0740  TSH 3.377   Anemia work up Recent Labs    12/08/22 0800  TIBC 222*  IRON 20*   Urinalysis    Component Value Date/Time   COLORURINE GREEN (A) 10/19/2022 2101   APPEARANCEUR HAZY (A) 10/19/2022 2101   APPEARANCEUR Clear 08/05/2022 1525   LABSPEC 1.014 10/19/2022 2101   PHURINE 5.0 10/19/2022 2101   GLUCOSEU NEGATIVE 10/19/2022 2101   HGBUR MODERATE (A) 10/19/2022 2101   BILIRUBINUR NEGATIVE 10/19/2022 2101   BILIRUBINUR Negative 08/05/2022 [place] NEGATIVE 10/19/2022 2101   PROTEINUR 30 (A) 10/19/2022 2101   UROBILINOGEN 0.2 01/16/2015 0848   NITRITE NEGATIVE 10/19/2022 2101   LEUKOCYTESUR LARGE (A) 10/19/2022 2101   Sepsis Labs Recent Labs  Lab 12/08/22 0800 12/08/22 1228 12/09/22 0516  WBC 7.6 7.9 9.3   Microbiology Recent Results (from the past 240 hour(s))  C Difficile Quick Screen w PCR reflex     Status: None   Collection Time: 12/08/22  6:40 PM   Specimen: STOOL  Result Value Ref Range Status   C Diff antigen NEGATIVE NEGATIVE Final   C Diff toxin NEGATIVE NEGATIVE Final   C Diff interpretation No C. difficile detected.  Final    Comment: Performed at [facility], [address], [place], [place] [address]  Gastrointestinal Panel by PCR , Stool     Status: None   Collection Time: 12/08/22  6:40 PM   Specimen: STOOL  Result Value Ref Range Status   Campylobacter species NOT DETECTED NOT DETECTED Final   Plesimonas shigelloides NOT DETECTED NOT DETECTED Final   Salmonella species NOT DETECTED NOT DETECTED Final   Yersinia enterocolitica NOT DETECTED NOT  DETECTED Final   Vibrio species NOT DETECTED NOT DETECTED Final   Vibrio cholerae NOT DETECTED NOT DETECTED Final   Enteroaggregative E coli (EAEC) NOT DETECTED NOT DETECTED Final   Enteropathogenic E coli (EPEC) NOT DETECTED NOT DETECTED Final   Enterotoxigenic E coli (ETEC) NOT DETECTED NOT DETECTED Final   Shiga like toxin producing E coli (STEC) NOT DETECTED NOT DETECTED Final   Shigella/Enteroinvasive E coli (EIEC) NOT DETECTED NOT DETECTED Final   Cryptosporidium NOT DETECTED NOT DETECTED Final   Cyclospora cayetanensis NOT DETECTED NOT DETECTED Final   Entamoeba histolytica NOT DETECTED NOT DETECTED Final   Giardia lamblia NOT DETECTED NOT DETECTED Final   Adenovirus F40/41 NOT DETECTED NOT DETECTED Final   Astrovirus NOT DETECTED NOT DETECTED Final   Norovirus GI/GII NOT DETECTED NOT DETECTED Final   Rotavirus A NOT DETECTED NOT DETECTED Final   Sapovirus (I, II, IV, and V) NOT DETECTED NOT DETECTED Final    Comment: Performed at [facility], [place]  [address] [place], [place] [address]    Procedures/Studies: DG Shoulder Left  Result Date: 12/08/2022 CLINICAL DATA:  Left shoulder pain EXAM: LEFT SHOULDER - 2+ VIEW COMPARISON:  None Available. FINDINGS: Normal alignment. No acute fracture or dislocation. Joint spaces are preserved. Left upper extremity PICC line noted, incompletely visualized on this exam. IMPRESSION: 1. Negative. Electronically Signed   By: Fidela Salisbury M.D.   On: 12/08/2022 20:23   DG Elbow 2 Views Left  Result Date: 12/08/2022 CLINICAL DATA:  Left shoulder and elbow pain EXAM: LEFT ELBOW - 2 VIEW COMPARISON:  None Available. FINDINGS: There is no evidence of fracture, dislocation, or joint effusion. Soft tissues are unremarkable. IMPRESSION: No acute fracture or dislocation. Electronically Signed   By: Placido Sou M.D.   On: 12/08/2022 20:23   DG Carlena Hurl OP MEDICARE SPEECH PATH  Result Date: 12/02/2022 Table formatting from the original  result was not included. Images from the original result were not included. [place], [place], [address] Phone: [phone]   Fax:  [phone]  Modified Barium Swallow  Patient Details Name: Jane Andrews MRN: [id number] Date of Birth: [birth year] No data recorded  Encounter Date: 12/02/2022    End of Session - 12/02/22 1905     Visit Number 1    Number of Visits 1    Authorization Type Medicare    SLP Start Time 1330    SLP Stop Time  1400    SLP Time Calculation (min) 30 min    Activity Tolerance Patient tolerated treatment well              Past Medical History: Diagnosis Date  Abdominal adhesions 1994  Allergic rhinitis    Anemia    Anxiety and depression    Aortic stenosis    Arthritis    Atrial fibrillation ([place]) 10/2012   Associated with severe anemia and esophageal pill impaction  Breast carcinoma ([place])     Right mastectomy  Cholelithiasis    Essential hypertension    Gastroesophageal reflux disease     Hiatal hernia  History of blood transfusion    Hypothyroidism    Low back pain    Malabsorption     Short gut syndrome following small bowel resection surgery x2  Nephrolithiasis 2004   Painless hematuria  Short gut syndrome     Bowel resection , 2004  Upper GI bleed 2004   Multiple episodes of melena-? due to gastritis or adverse drug effect (nonsteroidals, small bowel ulceration with Fosamax); caused by Pepto-Bismol during one Emergency Department evaluation       Past Surgical History: Procedure Laterality Date  ABDOMINAL HYSTERECTOMY   12/15/1958   massive gynecologic bleeding post delivery  ANTERIOR CERVICAL DECOMP/DISCECTOMY FUSION N/A 10/17/2022   Procedure: Anterior Cervical Discectomy and Fusion Cervical Three-Four/Cervical Four-Five;  Surgeon: Earnie Larsson, MD;  Location: [facility];  Service: Neurosurgery;  Laterality: N/A;  3C  BOWEL RESECTION       Resulting short gut syndrome  CHOLECYSTECTOMY N/A 06/25/2018   Procedure:  LAPAROSCOPIC CHOLECYSTECTOMY WITH INTRAOPERATIVE CHOLANGIOGRAM;  Surgeon: Armandina Gemma, MD;  Location: [facility];  Service: General;  Laterality: N/A;  COLONOSCOPY W/ POLYPECTOMY   12/16/2003   Lipoma; diverticulosis  COLONOSCOPY WITH ESOPHAGOGASTRODUODENOSCOPY (EGD)   11/22/2012   [place]  CYSTOSCOPY W/ RETROGRADES Bilateral 04/16/2022   Procedure: CYSTOSCOPY;  Surgeon: Primus Bravo., MD;  Location: [facility];  Service: Urology;  Laterality: Bilateral;  ESOPHAGOGASTRODUODENOSCOPY (EGD) WITH  PROPOFOL N/A 04/04/2022   Procedure: ESOPHAGOGASTRODUODENOSCOPY (EGD) WITH PROPOFOL;  Surgeon: Rogene Houston, MD;  Location: [facility];  Service: Endoscopy;  Laterality: N/A;  210  HIP ARTHROPLASTY Right 06/15/2020   Procedure: ANTERIOR ARTHROPLASTY BIPOLAR HIP (HEMIARTHROPLASTY);  Surgeon: Mcarthur Rossetti, MD;  Location: [facility];  Service: Orthopedics;  Laterality: Right;  IR GASTROSTOMY TUBE MOD SED   10/29/2022  LAPAROSCOPIC LYSIS OF ADHESIONS   12/16/1963   s/p adhesions  MASTECTOMY Right     Carcinoma of the breast; right  TRANSURETHRAL RESECTION OF BLADDER TUMOR N/A 04/16/2022   Procedure: TRANSURETHRAL RESECTION OF BLADDER TUMOR (TURBT);  Surgeon: Primus Bravo., MD;  Location: [facility];  Service: Urology;  Laterality: N/A;  UPPER GASTROINTESTINAL ENDOSCOPY       MBSS from 10/27/2022 while in acute:<<Pt continues to present with moderate pharyngeal dysphagia with seemingly slight improvement in posterior pharyngeal wall edema, however Pt continues to be at high risk for aspiration. Pt with mild premature spillage of liquids, delay in swallow trigger at the level of the valleculae. Pt with reduced tongue base retraction and epiglottic deflection with poor pharyngeal constriction. Pt able to swallow/clear ~50% of each bolus during initial swallow, however the remaining 50% is left in the pharynx and at times penetrated and aspirated. Pt turned AP and pharyngeal transit was noted to be greater on the left  side. Head turn to the right was only mildly effective in reducing pharyngeal residue. Suspect a transition to an oral diet is going to be longer than anticipated and Pt now deconditioned. Recommend strong consideration of alternative means of nutrition- consider PEG so that Pt can go to SNF for therapy. Prognosis for oral diet is good with time and therapy. Recommend NPO except for ice chips after oral care when Pt is alert and upright.>>  There were no vitals filed for this visit.    General - 12/02/22 1827            General Information   Date of Onset 10/17/22    HPI Jane Andrews is a 88 yo female who was hospitalized 11/3-11/4 for C3-4, C4-5 anterior cervical discectomy and fusion for cervical stenosis with myelopathy.  She had been discharged home with a soft neck brace with bedrest. She was hospitalized 11/5 - 10/31/2022 with dyspnea attributed to community-acquired pneumonia. She developed post op dysphagia from her procedure and had MBSS completed at [facility] while in acute setting on 11/7 (NPO) and 10/27/22 (NPO except ice chips and trials puree with SLP). She was referred for repeat MBSS by Dr. Unice Cobble (she is at [facility] for rehab).    Type of Study MBS-Modified Barium Swallow Study    Previous Swallow Assessment MBSS: 11/7 (NPO) and 10/27/22 (NPO except ice chips and trials puree with SLP)    Diet Prior to this Study NPO    Temperature Spikes Noted No    Respiratory Status Room air    History of Recent Intubation No    Behavior/Cognition Alert;Cooperative;Pleasant mood    Oral Cavity Assessment Other (comment)   coating on lingual surface   Oral Care Completed by SLP Yes    Oral Cavity - Dentition Adequate natural dentition    Vision Functional for self feeding    Self-Feeding Abilities Needs assist    Patient Positioning Upright in chair    Baseline Vocal Quality Normal    Volitional Cough Strong    Volitional Swallow Able to elicit    Anatomy Within functional limits  Pharyngeal Secretions  Not observed secondary MBS               Oral Preparation/Oral Phase - 12/02/22 1839            Oral Preparation/Oral Phase   Oral Phase Impaired        Oral - Nectar   Oral - Nectar Straw Within functional limits        Oral - Thin   Oral - Thin Teaspoon Within functional limits    Oral - Thin Cup Within functional limits    Oral - Thin Straw Within functional limits        Oral - Solids   Oral - Puree Oral residue    Oral - Regular Oral residue    Oral - Pill Within functional limits        Electrical stimulation - Oral Phase   Was Electrical Stimulation Used No             Pharyngeal Phase - 12/02/22 1856            Pharyngeal Phase   Pharyngeal Phase Impaired        Pharyngeal - Nectar   Pharyngeal- Nectar Straw Swallow initiation at vallecula;Swallow initiation at pyriform sinus;Reduced epiglottic inversion;Pharyngeal residue - valleculae;Pharyngeal residue - pyriform        Pharyngeal - Thin   Pharyngeal- Thin Teaspoon Swallow initiation at vallecula;Swallow initiation at pyriform sinus;Reduced epiglottic inversion;Reduced tongue base retraction    Pharyngeal- Thin Cup Swallow initiation at pyriform sinus;Reduced epiglottic inversion;Reduced tongue base retraction;Pharyngeal residue - valleculae;Pharyngeal residue - pyriform;Penetration/Aspiration during swallow    Pharyngeal Material enters airway, remains ABOVE vocal cords then ejected out    Pharyngeal- Thin Straw Swallow initiation at pyriform sinus;Reduced epiglottic inversion;Reduced airway/laryngeal closure;Reduced tongue base retraction;Penetration/Aspiration during swallow;Trace aspiration;Pharyngeal residue - valleculae;Pharyngeal residue - pyriform    Pharyngeal Material enters airway, passes BELOW cords then ejected out        Pharyngeal - Solids   Pharyngeal- Puree Swallow initiation at vallecula;Reduced epiglottic inversion;Reduced tongue base retraction;Pharyngeal residue - valleculae    Pharyngeal- Regular Delayed swallow  initiation-vallecula;Reduced tongue base retraction;Pharyngeal residue - valleculae    Pharyngeal- Pill Pharyngeal residue - valleculae   trace aspiration of cup thin, but removed when taking pill. Pill remained in valleculae, but eventually cleared with puree wash       Pharyngeal Phase - Comment   Pharyngeal Comment reduced tongue base retraction and reduced epiglottic deflection        Electrical Stimulation - Pharyngeal Phase   Was Electrical Stimulation Used No             Cricopharyngeal Phase - 12/02/22 1901            Cervical Esophageal Phase   Cervical Esophageal Phase Within functional limits   brief stasis of barium tablet in distal esophagus, but eventually cleared             Plan - 12/02/22 1905     Clinical Impression Statement Pt with improved oropharyngeal swallow function compared to her last MBSS on 10/27/2022. Current swallow is characterized by reduced tongue base retraction (weakness), reduced epiglottic deflection, and reduced laryngeal vestibule closure resulting in trace aspiration of thins when taking straw sip and when taking barium tablet (but spontaneously removed), variable mild (thins) to severe (regular textures) vallecular residue after the swallow. The barium tablet became trapped in the valleculae when it was taken with a cup sip of thin and was not removed  with liquid wash. It was eventually removed after several trials of puree wash. The graham cracker was eventually cleared from the valleculae after lots of thin wash. Head turn to the Right after the residual was already in the valleculae was NOT effective in reducing or eliminating residue. It did appear to be effective when the head turn to the RIGHT occurred during the primary swallow of bolus (ie, turn your head to the RIGHT and then swallow). I would caution implementing head turn to the Right as a maneuver because Pt likely needs to utilize those muscles to help improve to a more "normal" swallow. It sounds as if she has  had very minimal dysphagia therapy and PO trials since she was discharged from the acute setting. Recommend D1/puree and thin liquids via cup sips, crush medications as able in puree (if a pill cannot be crushed, recommend swallowing it whole in puree with her head turned to her Right and follow with liquid wash) and follow with liquid wash. Suspect Pt now experiencing muscle disuse atrophy and being on an oral diet should help her pharyngeal swallow. Her tongue is also coated and she needs assistance with oral care. Pt can likely be upgraded to other textures clinically by SLP.    Treatment/Interventions Compensatory techniques;Pharyngeal strengthening exercises;SLP instruction and feedback;Trials of upgraded texture/liquids;Diet toleration management by SLP;Patient/family education;Compensatory strategies    Potential to Achieve Goals Good    Consulted and Agree with Plan of Care Patient;Family member/caregiver           Patient will benefit from skilled therapeutic intervention in order to improve the following deficits and impairments:  Dysphagia, oropharyngeal phase      Recommendations/Treatment - 12/02/22 1902            Swallow Evaluation Recommendations   SLP Diet Recommendations Thin;Dysphagia 1 (puree)    Liquid Administration via Cup;No straw    Medication Administration Via alternative means   If a pill must be taken whole, place in puree and cue Pt to turn her head to her left when she swallows and then follow with puree wash   Supervision Patient able to self feed;Staff to assist with self feeding   hand weakness?   Compensations Slow rate;Small sips/bites;Multiple dry swallows after each bite/sip;Follow solids with liquid;Effortful swallow    Postural Changes Seated upright at 90 degrees;Remain upright for at least 30 minutes after feeds/meals             Prognosis - 12/02/22 1903            Prognosis   Prognosis for Safe Diet Advancement Good        Individuals Consulted   Consulted and Agree with  Results and Recommendations Patient;Family member/caregiver    Family Member Consulted Niece    Report Sent to  Referring physician;Facility (Comment)           Problem List    Patient Active Problem List   Diagnosis Date Noted  Chronic respiratory failure with hypoxia ([place]) 12/02/2022  Frequent UTI 12/02/2022  Short gut syndrome 12/01/2022  Addison disease ([place]) 11/14/2022  Hyponatremia 11/12/2022  Hypermagnesemia 11/12/2022  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 ([place]); 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; s/p TURBT 5/23; s/p palliative radiation tx 6/23 11/11/2022  History of UTI 11/11/2022  Hydronephrosis of left kidney - likely secondary to muscle invasive bladder cancer 11/11/2022  Hyperglycemia, drug-induced 11/04/2022  Chronic kidney disease, stage 3b ([place]) 10/29/2022  Protein-calorie malnutrition, severe  10/27/2022  Acute respiratory failure with hypoxia ([facility]) 10/20/2022  Overactive bladder 10/20/2022  Hypoalbuminemia due to protein-calorie malnutrition ([place]) 10/20/2022  Anxiety 10/20/2022  Depression 10/20/2022  Cervical stenosis of spinal canal 10/20/2022  CAP (community acquired pneumonia) 10/19/2022  Cervical myelopathy ([place]) 10/17/2022  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 ([place]) 05/23/2022  Central cord syndrome of cervical spinal cord ([place]) 05/19/2022  SAH (subarachnoid hemorrhage) ([place]) 05/13/2022  TBI (traumatic brain injury) ([place]) 05/13/2022  Bladder mass 04/16/2022  Hyperkalemia 04/09/2022  Mixed conductive and sensorineural hearing loss of both ears 03/03/2022  Normocytic anemia 02/11/2022  SIRS (systemic inflammatory response syndrome) ([place]) 02/10/2022  Sinusitis 02/10/2022  Nephrolithiasis 01/07/2022  Vulvar irritation 12/20/2021  Burning with urination 12/20/2021  Symptoms of urinary tract infection 12/20/2021  Diarrhea 03/05/2021  Acute kidney failure, unspecified ([place]) 08/15/2020  Scoliosis 06/26/2020  Thrombocytopenia  ([place]) 06/22/2020  Aspiration pneumonitis ([place]) 06/22/2020  Chronic constipation 06/21/2020  Post-menopausal osteoporosis 06/21/2020  Essential tremor 06/21/2020  Vitamin B 12 deficiency 06/21/2020  Major depression, recurrent, chronic ([place]) 06/21/2020  Status post total replacement of right hip    AKI (acute kidney injury) ([place])    Postoperative anemia due to acute blood loss    Closed right hip fracture (H[facility]) 06/14/2020  IDA (iron deficiency anemia) 02/28/2020  Atrial fibrillation with RVR ([place]) 12/25/2018  GERD (gastroesophageal reflux disease) 12/25/2018  Chronic cholecystitis s/p lap cholecystectomy 06/25/2018 06/18/2018  Vagal reaction 04/12/2016  Abdominal pain 09/19/2014  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  Ecchymoses, spontaneous 05/31/2013  Acquired hypothyroidism    Atrial fibrillation (HCC)    Breast carcinoma (HCC)    Malabsorption    CKD (chronic kidney disease) stage 4, GFR 15-29 ml/min ([place]) 10/21/2012  Anemia, normocytic normochromic 07/06/2012  Chronic diarrhea 02/10/2012  Hypertension 02/10/2012  Thank you,  Genene Churn, [place]  Genene Churn, [place] 12/02/2022, 7:09 PM  [facility] [address] [place], [place], [address] Phone: [phone]   Fax:  [phone]  Name: Jane Andrews MRN: [id number] Date of Birth: 1925/06/05 CLINICAL DATA:  Dysphagia.  Recent cervical spine surgery EXAM: MODIFIED BARIUM SWALLOW TECHNIQUE: Different consistencies of barium were administered orally to the patient by the Speech Pathologist. Imaging of the pharynx was performed in the lateral projection. The radiologist was present in the fluoroscopy room for this study, providing personal supervision. FLUOROSCOPY: Radiation Exposure Index (as provided by the fluoroscopic device): 32.0 mGy Kerma COMPARISON:  None available FINDINGS: Vestibular Penetration: Laryngeal penetration with thin barium by cup and straw. No laryngeal penetration with remaining  consistencies. Aspiration: Tiny amount of aspiration seen on a single swallow of thin barium by straw. Other: Significant vallecular residuals throughout exam. Partial clearance of residuals with head turned to RIGHT, not well cleared with head turned to LEFT. Patient swallowed a 12.5 mm diameter barium tablet, which lodged within the vallecula and was difficult to clear but eventually passed with multiple swallows of applesauce and liquids. Tablet passed from oral cavity to stomach without obstruction. Postsurgical changes of C3-C5 anterior cervical fusion. IMPRESSION: Swallowing dysfunction as above. Please refer to the Speech Pathologists report for complete details and recommendations. Electronically Signed   By: Lavonia Dana M.D.   On: 12/02/2022 15:16  US RENAL  Result Date: 11/14/2022 CLINICAL DATA:  Adrenal cortical insufficiency. Acute renal failure. History of bladder cancer and right renal staghorn calculus. EXAM: RENAL / URINARY TRACT ULTRASOUND COMPLETE COMPARISON:  Abdominopelvic CT 10/14/2022. Renal ultrasound 04/16/2022. FINDINGS: Right Kidney: Renal measurements: 9.3 x 4.9  x 4.6 cm = volume: 109.4 mL. Renal cortical thinning and increased echogenicity as before. There are small renal cysts measuring up to 2.4 cm in diameter. No hydronephrosis. Echogenic material in the renal pelvis and lower pole calices corresponding with staghorn calculus on recent CT. Left Kidney: Renal measurements: 7.9 x 4.3 x 4.4 cm = volume: 77.3 mL. Renal cortical thinning and increased echogenicity, as before. There are small renal cysts, largest in the upper pole measuring up to 3.9 cm. Moderate hydronephrosis increased from previous ultrasound but similar to more recent CT. Bladder: Probable dependent mass with internal blood flow within the bladder, measuring approximately 3.2 x 3.4 x 3.3 cm. Surrounding dependent debris. No ureteral jets were visualized. Other: Cystic right adnexal lesion measuring up to 3.6 cm was  seen on recent CT and is similar to prior studies dating back to 2017. Based on the patient's age and the relative stability of this lesion, this is of doubtful significance. Nonvisualization of the adrenal glands. IMPRESSION: 1. Moderate left hydronephrosis, increased from previous ultrasound but similar to more recent CT. 2. Probable dependent bladder mass with internal blood flow suspicious for recurrent bladder malignancy, especially given the patient's history. The bladder was suboptimally evaluated on recent CT due to artifact from the right hip replacement. If not recently performed, recommend cystoscopy. 3. Right renal staghorn calculus, bilateral renal cysts and renal cortical thinning and increased echogenicity, as before, compatible with chronic medical renal disease. Electronically Signed   By: Richardean Sale M.D.   On: 11/14/2022 11:26     Time coordinating discharge: Over 30 minutes    Jane Dee, MD  [facility] 12/09/2022, 3:02 PM

## 2022-12-10 LAB — BPAM RBC
Blood Product Expiration Date: 202401242359
ISSUE DATE / TIME: 202312260032
Unit Type and Rh: 5100

## 2022-12-10 LAB — TYPE AND SCREEN
ABO/RH(D): O POS
Antibody Screen: POSITIVE
Unit division: 0

## 2022-12-15 DEATH — deceased

## 2023-01-05 ENCOUNTER — Ambulatory Visit: Payer: Medicare Other | Admitting: Urology

## 2023-02-12 ENCOUNTER — Encounter: Payer: Self-pay | Admitting: Radiology

## 2023-02-23 ENCOUNTER — Ambulatory Visit: Payer: Medicare Other | Admitting: Cardiology

## 2023-02-26 IMAGING — PT NM PET TUM IMG INITIAL (PI) SKULL BASE T - THIGH
1 of 7 series · 1 of 25 positions shown · non-contrast
Comparison: Renal stone protocol CT of January 2022.

CLINICAL DATA: Initial treatment strategy for bladder cancer.

EXAM:
NUCLEAR MEDICINE PET SKULL BASE TO THIGH
TECHNIQUE: 6.46 mCi F-18 FDG was injected intravenously. Full-ring PET imaging
was performed from the skull base to thigh after the radiotracer. CT
data was obtained and used for attenuation correction and anatomic
localization.
Fasting blood glucose: 116 mg/dl

[Series 3: ctac · axial · 3.0mm · 0.98mm/px · 1 of 293 slices shown]
[im 293/293  brain]
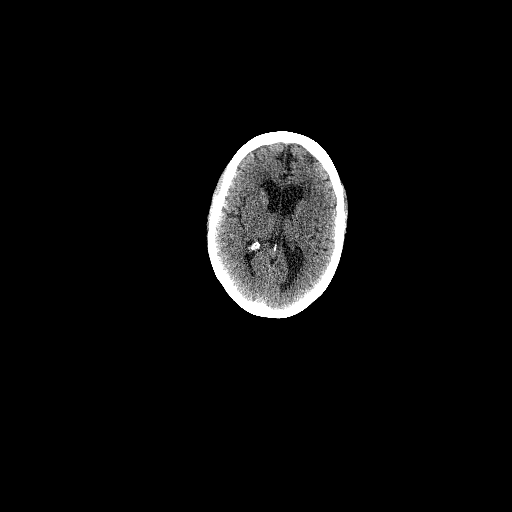

[1 of 25 positions shown; findings below may reference images not displayed]

FINDINGS: Mediastinal blood pool activity: SUV max

Liver activity: SUV max NA

NECK: No hypermetabolic lymph nodes in the neck.

Incidental CT findings: none

CHEST: No hypermetabolic mediastinal or hilar nodes. No suspicious
pulmonary nodules on the CT scan.

Incidental CT findings: Aortic atherosclerosis, no aneurysmal
dilation of the thoracic aorta. Central pulmonary vasculature is
normal caliber. Normal heart size without pericardial effusion.
Limited assessment of cardiovascular structures given lack of
intravenous contrast. Signs of RIGHT mastectomy and breast
reconstruction. No adenopathy by size criteria in the chest.

Lungs are clear and airways are patent aside from basilar
atelectasis.

ABDOMEN/PELVIS: No signs of solid organ metastasis or nodal disease
with increased metabolic activity.

At the bladder base there are areas of potential irregularity
extending beyond the bladder base though the pattern of FDG uptake
could also be related to bladder diverticulum at the bladder base,
this is not clear and the area shows substantial beam hardening
artifact from RIGHT hip arthroplasty changes limiting assessment of
the bladder base. Discrete lesion of the bladder base is not seen by
CT.

Abnormal contour of the bladder base as evidence by FDG uptake best
seen on image 210/3. When windowed differently there are more focal
areas of increased metabolic activity with a maximum SUV of 22
though difficult to separate at different when the levels from
opacified urine both in the LEFT and RIGHT hemi bladder. Area of
bulging at the bladder base inseparable from region of reproductive
structures and not well-defined.

Incidental CT findings: Post cholecystectomy. No acute findings
relative to pancreas, spleen, adrenal glands or gastrointestinal
tract. Large staghorn calculus fills the RIGHT renal collecting
systems. No LEFT-sided hydronephrosis or distal RIGHT ureteral
dilation. Aortic atherosclerosis without aneurysmal dilation.

SKELETON: No focal hypermetabolic activity to suggest skeletal
metastasis.

Incidental CT findings: Spinal degenerative changes and RIGHT hip
arthroplasty.
IMPRESSION: Irregular contour the urinary bladder could reflect the patient's
bladder mass though could also be related to bladder diverticulum or
perivesical extension of bladder tumor. Ultrasound correlation could
be considered in addition to MRI for further assessment if this
would change management. No signs of metastatic disease.

Multifocal sites of variable FDG uptake at the bladder base may
reflect multifocal involvement or artifact related to attenuation
correction, concentrated urine with FDG does not allow for
assessment and streak artifact on CT for the limits evaluation.

Staghorn calculus of the RIGHT kidney similar to prior imaging.

Aortic atherosclerosis.
